# Patient Record
Sex: Male | Born: 1965 | Hispanic: No | Marital: Married | Smoking: Former smoker
Health system: Southern US, Community
[De-identification: ages and names within clinical notes are randomized; demographics above are authoritative.]

## PROBLEM LIST (undated history)

## (undated) DIAGNOSIS — I639 Cerebral infarction, unspecified: Secondary | ICD-10-CM

## (undated) DIAGNOSIS — I10 Essential (primary) hypertension: Secondary | ICD-10-CM

## (undated) DIAGNOSIS — E119 Type 2 diabetes mellitus without complications: Secondary | ICD-10-CM

## (undated) DIAGNOSIS — I2699 Other pulmonary embolism without acute cor pulmonale: Secondary | ICD-10-CM

## (undated) DIAGNOSIS — J189 Pneumonia, unspecified organism: Secondary | ICD-10-CM

---

## 1997-06-24 ENCOUNTER — Ambulatory Visit (HOSPITAL_BASED_OUTPATIENT_CLINIC_OR_DEPARTMENT_OTHER): Admission: RE | Admit: 1997-06-24 | Discharge: 1997-06-24 | Payer: Self-pay | Admitting: *Deleted

## 2019-01-26 ENCOUNTER — Encounter (HOSPITAL_BASED_OUTPATIENT_CLINIC_OR_DEPARTMENT_OTHER): Payer: Self-pay

## 2019-01-26 ENCOUNTER — Other Ambulatory Visit: Payer: Self-pay

## 2019-01-26 ENCOUNTER — Emergency Department (HOSPITAL_BASED_OUTPATIENT_CLINIC_OR_DEPARTMENT_OTHER)
Admission: EM | Admit: 2019-01-26 | Discharge: 2019-01-26 | Disposition: A | Discharge status: Home / Self Care | Payer: Self-pay | Attending: Emergency Medicine | Admitting: Emergency Medicine

## 2019-01-26 ENCOUNTER — Emergency Department (HOSPITAL_BASED_OUTPATIENT_CLINIC_OR_DEPARTMENT_OTHER): Payer: Self-pay

## 2019-01-26 DIAGNOSIS — Z20822 Contact with and (suspected) exposure to covid-19: Secondary | ICD-10-CM | POA: Insufficient documentation

## 2019-01-26 DIAGNOSIS — U071 COVID-19: Secondary | ICD-10-CM | POA: Insufficient documentation

## 2019-01-26 LAB — CBC WITH DIFFERENTIAL/PLATELET
Abs Immature Granulocytes: 0.03 10*3/uL (ref 0.00–0.07)
Basophils Absolute: 0 10*3/uL (ref 0.0–0.1)
Basophils Relative: 0 %
Eosinophils Absolute: 0 10*3/uL (ref 0.0–0.5)
Eosinophils Relative: 0 %
HCT: 44 % (ref 39.0–52.0)
Hemoglobin: 14.5 g/dL (ref 13.0–17.0)
Immature Granulocytes: 1 %
Lymphocytes Relative: 25 %
Lymphs Abs: 1.3 10*3/uL (ref 0.7–4.0)
MCH: 28.3 pg (ref 26.0–34.0)
MCHC: 33 g/dL (ref 30.0–36.0)
MCV: 85.9 fL (ref 80.0–100.0)
Monocytes Absolute: 0.3 10*3/uL (ref 0.1–1.0)
Monocytes Relative: 7 %
Neutro Abs: 3.4 10*3/uL (ref 1.7–7.7)
Neutrophils Relative %: 67 %
Platelets: 254 10*3/uL (ref 150–400)
RBC: 5.12 MIL/uL (ref 4.22–5.81)
RDW: 11.8 % (ref 11.5–15.5)
WBC: 5.1 10*3/uL (ref 4.0–10.5)
nRBC: 0 % (ref 0.0–0.2)

## 2019-01-26 LAB — COMPREHENSIVE METABOLIC PANEL
ALT: 35 U/L (ref 0–44)
AST: 42 U/L — ABNORMAL HIGH (ref 15–41)
Albumin: 3.5 g/dL (ref 3.5–5.0)
Alkaline Phosphatase: 64 U/L (ref 38–126)
Anion gap: 14 (ref 5–15)
BUN: 7 mg/dL (ref 6–20)
CO2: 23 mmol/L (ref 22–32)
Calcium: 8 mg/dL — ABNORMAL LOW (ref 8.9–10.3)
Chloride: 96 mmol/L — ABNORMAL LOW (ref 98–111)
Creatinine, Ser: 0.69 mg/dL (ref 0.61–1.24)
GFR calc Af Amer: >60 mL/min (ref >60)
GFR calc non Af Amer: >60 mL/min (ref >60)
Glucose, Bld: 229 mg/dL — ABNORMAL HIGH (ref 70–99)
Potassium: 3.4 mmol/L — ABNORMAL LOW (ref 3.5–5.1)
Sodium: 133 mmol/L — ABNORMAL LOW (ref 135–145)
Total Bilirubin: 0.7 mg/dL (ref 0.3–1.2)
Total Protein: 7.3 g/dL (ref 6.5–8.1)

## 2019-01-26 LAB — LACTIC ACID, PLASMA: Lactic Acid, Venous: 1.1 mmol/L (ref 0.5–1.9)

## 2019-01-26 LAB — SARS CORONAVIRUS 2 AG (30 MIN TAT): SARS Coronavirus 2 Ag: POSITIVE — AB

## 2019-01-26 MED ORDER — ACETAMINOPHEN 325 MG PO TABS
650.0000 mg | ORAL_TABLET | Freq: Once | ORAL | Status: AC | PRN
  Administered 2019-01-26: 650 mg via ORAL
  Filled 2019-01-26 (×2): qty 2

## 2019-01-26 MED ORDER — DOXYCYCLINE HYCLATE 100 MG PO CAPS: 100.0000 mg | ORAL_CAPSULE | Freq: Two times a day (BID) | ORAL | 0 refills | Status: DC

## 2019-01-26 MED ORDER — SODIUM CHLORIDE 0.9 % IV BOLUS
1000.0000 mL | Freq: Once | INTRAVENOUS | Status: AC
  Administered 2019-01-26: 1000 mL via INTRAVENOUS

## 2019-01-26 NOTE — Discharge Instructions (Addendum)
Thank you for allowing [place] to care for you today.   Please return to the emergency department if you have any new or worsening symptoms.  You tested positive for covid-19 today.   Medications- You can take medications to help treat your symptoms: -Tylenol for fever and body aches. Please take as prescribed on the bottle. -Over the coutner cough medicine such as mucinex, robitussin, or other brands. -Flonase or saline nasal spray for nasal congestion -Vitamins as recommended by CDC  Treatment-  Prescription sent to your pharmacy for doxycycline.  Please take this as prescribed.  This an antibiotic medicine used to treat pneumonia.  Your chest x-ray today showed you have pneumonia.  This could be related to Covid.  It is important to monitor your symptoms closely: -You should have a theremometer at home to check your temperature when feeling feverish. -Use a pulse ox meter to measure your oxygen when feeling short of breath.  -If your fever is over 100.4 despite taking tylenol or if your oxygen level drops below 94% these are reasons to rturn to the emergency department for further evaluation. Please call the emergency department before you come to make [place] aware.    We recommend you self-isolate for 10 days and to inform your work/family/friends that you has the virus.  They will need to self-quarantine for 14 days to monitor for symptoms.    Again: symptoms of shortness of breath, chest pain, difficulty breathing, new onset of confusion, any symptoms that are concerning. If any of these symptoms you should come to emergency department for evaluation.   I hope you feel better soon    -Also very important that you follow-up with a primary care provider to have your blood sugar rechecked.  It was elevated today.  You might have diabetes.  You can call the number provided in your discharge paperwork to help establish care with a primary care provider.

## 2019-01-26 NOTE — ED Triage Notes (Signed)
Pt c/o cough x 3 days. Pt also having chills, body aches.

## 2019-01-26 NOTE — ED Notes (Signed)
Pt paced room x 8  Initial O2 95%, pt stayed 95-96% during ambulation.

## 2019-01-26 NOTE — ED Provider Notes (Signed)
[facility] EMERGENCY DEPARTMENT Provider Note   CSN: [id number] Arrival date & time: 01/26/19  1635     History Chief Complaint  Patient presents with  . Cough    Andrew Bruce is a 54 y.o. male with no known past medical history presenting to emergency department today with chief complaint of cough x3 days.  Patient is also endorsing chills and body aches.  He states his cough is productive with yellow phlegm.  He has checked his temperature at home and had T-max of 101 yesterday. He has body aches from head to toe, states everything hurts.  He has not taken any medications at home for his symptoms but has been drinking hot tea. Admits to loss of smell and taste.  He denies any congestion, rhinorrhea, sore throat, chest pain, shortness of breath, dyspnea on exertion, neck pain, headache, abdominal pain, nausea, vomiting, urinary symptoms, diarrhea, lower extremity edema.  He denies any sick contacts.  He denies any contact with anyone known positive for COVID-19.   Due to language barrier, a video interpreter was present during the history-taking and subsequent discussion (and for part of the physical exam) with this patient.   History reviewed. No pertinent past medical history.  There are no problems to display for this patient.   History reviewed. No pertinent surgical history.     No family history on file.  Social History   Tobacco Use  . Smoking status: Never Smoker  . Smokeless tobacco: Never Used  Substance Use Topics  . Alcohol use: Yes  . Drug use: Never    Home Medications Prior to Admission medications   Medication Sig Start Date End Date Taking? Authorizing Provider  doxycycline (VIBRAMYCIN) 100 MG capsule Take 1 capsule (100 mg total) by mouth 2 (two) times daily. 01/26/19   Kayveon Lennartz, Harley Hallmark, PA-C    Allergies    Patient has no known allergies.  Review of Systems   Review of Systems All other systems are reviewed and are negative for  acute change except as noted in the HPI.  Physical Exam Updated Vital Signs BP (!) 168/93 (BP Location: Left Arm)   Pulse 92   Temp (!) 103.2 F (39.6 C) (Oral)   Resp (!) 24   Ht 5' 6"  (1.676 m)   Wt 81.6 kg   SpO2 93%   BMI 29.05 kg/m   Physical Exam Vitals and nursing note reviewed.  Constitutional:      General: He is not in acute distress.    Appearance: He is not ill-appearing.  HENT:     Head: Normocephalic and atraumatic.     Right Ear: Tympanic membrane and external ear normal.     Left Ear: Tympanic membrane and external ear normal.     Nose: Nose normal.     Mouth/Throat:     Mouth: Mucous membranes are dry.     Pharynx: Oropharynx is clear.     Comments: Minor erythema to oropharynx, no edema, no exudate, no tonsillar swelling, voice normal, neck supple without lymphadenopathy  Eyes:     General: No scleral icterus.       Right eye: No discharge.        Left eye: No discharge.     Extraocular Movements: Extraocular movements intact.     Conjunctiva/sclera: Conjunctivae normal.     Pupils: Pupils are equal, round, and reactive to light.  Neck:     Vascular: No JVD.     Comments: No meningeal signs  Cardiovascular:     Rate and Rhythm: Normal rate and regular rhythm.     Pulses: Normal pulses.          Radial pulses are 2+ on the right side and 2+ on the left side.     Heart sounds: Normal heart sounds.  Pulmonary:     Comments: Lung sounds diminished throughout.  Symmetric chest rise, normal work of breathing. No wheezing, rales, or rhonchi.  SPO2 during exam is 95% on room air. Abdominal:     Comments: Abdomen is soft, non-distended, and non-tender in all quadrants. No rigidity, no guarding. No peritoneal signs.  Musculoskeletal:        General: Normal range of motion.     Cervical back: Normal range of motion.     Right lower leg: No edema.     Left lower leg: No edema.  Skin:    General: Skin is warm and dry.     Capillary Refill: Capillary refill  takes less than 2 seconds.     Findings: No rash.  Neurological:     Mental Status: He is oriented to person, place, and time.     GCS: GCS eye subscore is 4. GCS verbal subscore is 5. GCS motor subscore is 6.     Comments: Fluent speech, no facial droop.  Psychiatric:        Behavior: Behavior normal.     ED Results / Procedures / Treatments   Labs (all labs ordered are listed, but only abnormal results are displayed) Labs Reviewed  SARS CORONAVIRUS 2 AG (30 MIN TAT) - Abnormal; Notable for the following components:      Result Value   SARS Coronavirus 2 Ag POSITIVE (*)    All other components within normal limits  COMPREHENSIVE METABOLIC PANEL - Abnormal; Notable for the following components:   Sodium 133 (*)    Potassium 3.4 (*)    Chloride 96 (*)    Glucose, Bld 229 (*)    Calcium 8.0 (*)    AST 42 (*)    All other components within normal limits  LACTIC ACID, PLASMA  CBC WITH DIFFERENTIAL/PLATELET  LACTIC ACID, PLASMA    EKG None  Radiology DG Chest Portable 1 View  Result Date: 01/26/2019 CLINICAL DATA:  54 year old male with fever and cough. EXAM: PORTABLE CHEST 1 VIEW COMPARISON:  None. FINDINGS: Patchy area of streaky and hazy density in the left lower lung field as well as minimal right mid and right lung base streaky densities concerning for pneumonia, possibly viral or atypical in etiology. Clinical correlation is recommended. No pleural effusion or pneumothorax. Top-normal cardiac size. No acute osseous pathology. IMPRESSION: Pneumonia.  Clinical correlation and follow-up recommended. Electronically Signed   By: Anner Crete M.D.   On: 01/26/2019 17:42    Procedures Procedures (including critical care time)  Medications Ordered in ED Medications  acetaminophen (TYLENOL) tablet 650 mg (650 mg Oral Given 01/26/19 1710)  sodium chloride 0.9 % bolus 1,000 mL (0 mLs Intravenous Stopped 01/26/19 2019)    ED Course  I have reviewed the triage vital signs and  the nursing notes.  Pertinent labs & imaging results that were available during my care of the patient were reviewed by me and considered in my medical decision making (see chart for details).  Clinical Course as of Jan 27 135  Tue Jan 26, 2019  1922 Lactic acid within normal range  Lactic acid, plasma [KA]  1922 No leukocytosis, no anemia  CBC  with Differential [KA]  1922 Covid positive  SARS Coronavirus 2 Ag (30 min TAT) - Nasal Swab (BD Veritor Kit)(!) [KA]  1922 CMP notable for glucose of 229, anion gap of 14  Comprehensive metabolic panel(!) [KA]    Clinical Course User Index [KA] Cherre Robins, PA-C   Vitals:   01/26/19 1658 01/26/19 1914 01/26/19 1923 01/26/19 2146  BP:  135/76  130/74  Pulse:  90  78  Resp:  20  19  Temp:   97.9 F (36.6 C) 99.1 F (37.3 C)  TempSrc:   Oral Oral  SpO2:  95%  96%  Weight: 81.6 kg     Height: 5' 6"  (1.676 m)       MDM Rules/Calculators/A&P                      Patient seen and examined. Patient presents awake, alert, hemodynamically stable, nontoxic appearing, in no acute distress.  In triage patient was noted to be febrile at 103.2 had SPO2 of 93% on room air, he was not tachycardic.  He received Tylenol for fever in triage. On my exam patient with SPO2 of 95%.  He is afebrile at 98.7.  He appears dehydrated, mucous membranes are dry.  Lung sounds are diminished throughout, normal work of breathing, no respiratory distress. He is speaking in full sentences. Abdomen is nontender, no peritoneal signs. I viewed labs ordered in triage and significant findings are documented in ED course above.  Patient tested positive for Covid.  No leukocytosis, no anemia.  Lactic acid within normal range at 1.1. He does have glucose of 229, he denies history of diabetes.  Anion gap is 14, still within normal range however at the higher end of normal.  Given this and his signs of dehydration will give 1L IVF. Chest xray viewed by me concerning for  pneumonia with patchy and hazy density left lower lung and right mid and lung base streaky densities.  Patient ambulated in the emergency department without respiratory distress, tachycardia, or hypoxia. SpO2 during ambulation >94% on room air. Engaged in shared decision-making.  Patient is requesting to be discharged home.  He feels he can manage her symptoms at home.  Will discharge home with prescription for doxycycline to cover for possible bacterial pneumonia. The patient appears reasonably screened and/or stabilized for discharge and I doubt any other medical condition or other [facility] requiring further screening, evaluation, or treatment in the ED at this time prior to discharge. The patient is safe for discharge with strict return precautions discussed. Recommend pcp follow up to have further testing for diabetes, given resource list.Findings and plan of care discussed with supervising physician Dr. Johnney Killian agrees with plan and antibiotic coverage.  Andrew Bruce was evaluated in Emergency Department on 01/27/2019 for the symptoms described in the history of present illness. He was evaluated in the context of the global COVID-19 pandemic, which necessitated consideration that the patient might be at risk for infection with the SARS-CoV-2 virus that causes COVID-19. Institutional protocols and algorithms that pertain to the evaluation of patients at risk for COVID-19 are in a state of rapid change based on information released by regulatory bodies including the CDC and federal and state organizations. These policies and algorithms were followed during the patient's care in the ED.   Portions of this note were generated with [employer]. Dictation errors may occur despite best attempts at proofreading.  Final Clinical Impression(s) / ED Diagnoses Final diagnoses:  COVID-19 virus infection    Rx / DC Orders ED Discharge Orders         Ordered    doxycycline (VIBRAMYCIN) 100 MG  capsule  2 times daily     01/26/19 2123           Flint Melter 01/27/19 2244    Charlesetta Shanks, MD 02/02/19 [phone]

## 2019-02-06 ENCOUNTER — Inpatient Hospital Stay (HOSPITAL_COMMUNITY)
Admission: EM | Admit: 2019-02-06 | Discharge: 2019-03-23 | DRG: 004 | Disposition: A | Discharge status: Rehabilitation Facility | Payer: HRSA Program | Attending: Internal Medicine | Admitting: Internal Medicine

## 2019-02-06 ENCOUNTER — Emergency Department (HOSPITAL_COMMUNITY): Payer: HRSA Program

## 2019-02-06 ENCOUNTER — Other Ambulatory Visit: Payer: Self-pay

## 2019-02-06 ENCOUNTER — Encounter (HOSPITAL_COMMUNITY): Payer: Self-pay

## 2019-02-06 DIAGNOSIS — Z938 Other artificial opening status: Secondary | ICD-10-CM

## 2019-02-06 DIAGNOSIS — Z8616 Personal history of COVID-19: Secondary | ICD-10-CM | POA: Diagnosis not present

## 2019-02-06 DIAGNOSIS — I6340 Cerebral infarction due to embolism of unspecified cerebral artery: Secondary | ICD-10-CM | POA: Insufficient documentation

## 2019-02-06 DIAGNOSIS — U071 COVID-19: Secondary | ICD-10-CM | POA: Diagnosis not present

## 2019-02-06 DIAGNOSIS — J939 Pneumothorax, unspecified: Secondary | ICD-10-CM

## 2019-02-06 DIAGNOSIS — Z7401 Bed confinement status: Secondary | ICD-10-CM

## 2019-02-06 DIAGNOSIS — Z430 Encounter for attention to tracheostomy: Secondary | ICD-10-CM | POA: Diagnosis not present

## 2019-02-06 DIAGNOSIS — Z794 Long term (current) use of insulin: Secondary | ICD-10-CM | POA: Diagnosis not present

## 2019-02-06 DIAGNOSIS — T41295A Adverse effect of other general anesthetics, initial encounter: Secondary | ICD-10-CM | POA: Diagnosis not present

## 2019-02-06 DIAGNOSIS — Z515 Encounter for palliative care: Secondary | ICD-10-CM

## 2019-02-06 DIAGNOSIS — R29731 NIHSS score 31: Secondary | ICD-10-CM | POA: Diagnosis not present

## 2019-02-06 DIAGNOSIS — E46 Unspecified protein-calorie malnutrition: Secondary | ICD-10-CM | POA: Diagnosis not present

## 2019-02-06 DIAGNOSIS — J95851 Ventilator associated pneumonia: Secondary | ICD-10-CM | POA: Diagnosis not present

## 2019-02-06 DIAGNOSIS — R0989 Other specified symptoms and signs involving the circulatory and respiratory systems: Secondary | ICD-10-CM | POA: Diagnosis not present

## 2019-02-06 DIAGNOSIS — T886XXA Anaphylactic reaction due to adverse effect of correct drug or medicament properly administered, initial encounter: Secondary | ICD-10-CM | POA: Diagnosis not present

## 2019-02-06 DIAGNOSIS — Z931 Gastrostomy status: Secondary | ICD-10-CM | POA: Diagnosis not present

## 2019-02-06 DIAGNOSIS — J383 Other diseases of vocal cords: Secondary | ICD-10-CM | POA: Diagnosis not present

## 2019-02-06 DIAGNOSIS — D649 Anemia, unspecified: Secondary | ICD-10-CM | POA: Diagnosis not present

## 2019-02-06 DIAGNOSIS — R34 Anuria and oliguria: Secondary | ICD-10-CM | POA: Diagnosis not present

## 2019-02-06 DIAGNOSIS — Y848 Other medical procedures as the cause of abnormal reaction of the patient, or of later complication, without mention of misadventure at the time of the procedure: Secondary | ICD-10-CM | POA: Diagnosis not present

## 2019-02-06 DIAGNOSIS — T380X5A Adverse effect of glucocorticoids and synthetic analogues, initial encounter: Secondary | ICD-10-CM | POA: Diagnosis not present

## 2019-02-06 DIAGNOSIS — J9600 Acute respiratory failure, unspecified whether with hypoxia or hypercapnia: Secondary | ICD-10-CM

## 2019-02-06 DIAGNOSIS — G8191 Hemiplegia, unspecified affecting right dominant side: Secondary | ICD-10-CM | POA: Diagnosis not present

## 2019-02-06 DIAGNOSIS — J9601 Acute respiratory failure with hypoxia: Secondary | ICD-10-CM | POA: Diagnosis not present

## 2019-02-06 DIAGNOSIS — Z930 Tracheostomy status: Secondary | ICD-10-CM | POA: Diagnosis not present

## 2019-02-06 DIAGNOSIS — R1310 Dysphagia, unspecified: Secondary | ICD-10-CM | POA: Diagnosis not present

## 2019-02-06 DIAGNOSIS — Z95828 Presence of other vascular implants and grafts: Secondary | ICD-10-CM

## 2019-02-06 DIAGNOSIS — B948 Sequelae of other specified infectious and parasitic diseases: Secondary | ICD-10-CM | POA: Diagnosis not present

## 2019-02-06 DIAGNOSIS — I7410 Embolism and thrombosis of unspecified parts of aorta: Secondary | ICD-10-CM | POA: Diagnosis not present

## 2019-02-06 DIAGNOSIS — R5381 Other malaise: Secondary | ICD-10-CM | POA: Diagnosis not present

## 2019-02-06 DIAGNOSIS — Z7901 Long term (current) use of anticoagulants: Secondary | ICD-10-CM | POA: Diagnosis not present

## 2019-02-06 DIAGNOSIS — I69398 Other sequelae of cerebral infarction: Secondary | ICD-10-CM | POA: Diagnosis not present

## 2019-02-06 DIAGNOSIS — L89304 Pressure ulcer of unspecified buttock, stage 4: Secondary | ICD-10-CM | POA: Diagnosis not present

## 2019-02-06 DIAGNOSIS — D72825 Bandemia: Secondary | ICD-10-CM | POA: Diagnosis not present

## 2019-02-06 DIAGNOSIS — T4275XA Adverse effect of unspecified antiepileptic and sedative-hypnotic drugs, initial encounter: Secondary | ICD-10-CM | POA: Diagnosis not present

## 2019-02-06 DIAGNOSIS — E1152 Type 2 diabetes mellitus with diabetic peripheral angiopathy with gangrene: Secondary | ICD-10-CM | POA: Diagnosis not present

## 2019-02-06 DIAGNOSIS — J80 Acute respiratory distress syndrome: Secondary | ICD-10-CM

## 2019-02-06 DIAGNOSIS — J9611 Chronic respiratory failure with hypoxia: Secondary | ICD-10-CM

## 2019-02-06 DIAGNOSIS — E870 Hyperosmolality and hypernatremia: Secondary | ICD-10-CM | POA: Diagnosis not present

## 2019-02-06 DIAGNOSIS — B9689 Other specified bacterial agents as the cause of diseases classified elsewhere: Secondary | ICD-10-CM | POA: Diagnosis not present

## 2019-02-06 DIAGNOSIS — I96 Gangrene, not elsewhere classified: Secondary | ICD-10-CM | POA: Diagnosis not present

## 2019-02-06 DIAGNOSIS — I50811 Acute right heart failure: Secondary | ICD-10-CM | POA: Diagnosis not present

## 2019-02-06 DIAGNOSIS — K5903 Drug induced constipation: Secondary | ICD-10-CM | POA: Diagnosis not present

## 2019-02-06 DIAGNOSIS — D62 Acute posthemorrhagic anemia: Secondary | ICD-10-CM | POA: Diagnosis not present

## 2019-02-06 DIAGNOSIS — J9612 Chronic respiratory failure with hypercapnia: Secondary | ICD-10-CM

## 2019-02-06 DIAGNOSIS — R1314 Dysphagia, pharyngoesophageal phase: Secondary | ICD-10-CM | POA: Diagnosis not present

## 2019-02-06 DIAGNOSIS — J189 Pneumonia, unspecified organism: Secondary | ICD-10-CM

## 2019-02-06 DIAGNOSIS — J982 Interstitial emphysema: Secondary | ICD-10-CM | POA: Diagnosis present

## 2019-02-06 DIAGNOSIS — J1282 Pneumonia due to coronavirus disease 2019: Secondary | ICD-10-CM | POA: Diagnosis present

## 2019-02-06 DIAGNOSIS — Z79899 Other long term (current) drug therapy: Secondary | ICD-10-CM

## 2019-02-06 DIAGNOSIS — Y92230 Patient room in hospital as the place of occurrence of the external cause: Secondary | ICD-10-CM | POA: Diagnosis not present

## 2019-02-06 DIAGNOSIS — Z978 Presence of other specified devices: Secondary | ICD-10-CM

## 2019-02-06 DIAGNOSIS — Z9911 Dependence on respirator [ventilator] status: Secondary | ICD-10-CM | POA: Diagnosis not present

## 2019-02-06 DIAGNOSIS — R7309 Other abnormal glucose: Secondary | ICD-10-CM | POA: Diagnosis not present

## 2019-02-06 DIAGNOSIS — R339 Retention of urine, unspecified: Secondary | ICD-10-CM | POA: Diagnosis not present

## 2019-02-06 DIAGNOSIS — E11649 Type 2 diabetes mellitus with hypoglycemia without coma: Secondary | ICD-10-CM | POA: Diagnosis not present

## 2019-02-06 DIAGNOSIS — I2609 Other pulmonary embolism with acute cor pulmonale: Secondary | ICD-10-CM | POA: Diagnosis not present

## 2019-02-06 DIAGNOSIS — E1165 Type 2 diabetes mellitus with hyperglycemia: Secondary | ICD-10-CM | POA: Diagnosis not present

## 2019-02-06 DIAGNOSIS — E781 Pure hyperglyceridemia: Secondary | ICD-10-CM | POA: Diagnosis not present

## 2019-02-06 DIAGNOSIS — E876 Hypokalemia: Secondary | ICD-10-CM | POA: Diagnosis not present

## 2019-02-06 DIAGNOSIS — Z4659 Encounter for fitting and adjustment of other gastrointestinal appliance and device: Secondary | ICD-10-CM

## 2019-02-06 DIAGNOSIS — G9341 Metabolic encephalopathy: Secondary | ICD-10-CM | POA: Diagnosis not present

## 2019-02-06 DIAGNOSIS — L8990 Pressure ulcer of unspecified site, unspecified stage: Secondary | ICD-10-CM | POA: Insufficient documentation

## 2019-02-06 DIAGNOSIS — E1169 Type 2 diabetes mellitus with other specified complication: Secondary | ICD-10-CM | POA: Diagnosis not present

## 2019-02-06 DIAGNOSIS — L89150 Pressure ulcer of sacral region, unstageable: Secondary | ICD-10-CM | POA: Diagnosis not present

## 2019-02-06 DIAGNOSIS — E162 Hypoglycemia, unspecified: Secondary | ICD-10-CM | POA: Diagnosis not present

## 2019-02-06 DIAGNOSIS — T402X5A Adverse effect of other opioids, initial encounter: Secondary | ICD-10-CM | POA: Diagnosis not present

## 2019-02-06 DIAGNOSIS — K5900 Constipation, unspecified: Secondary | ICD-10-CM

## 2019-02-06 DIAGNOSIS — L89309 Pressure ulcer of unspecified buttock, unspecified stage: Secondary | ICD-10-CM | POA: Diagnosis not present

## 2019-02-06 DIAGNOSIS — Z7189 Other specified counseling: Secondary | ICD-10-CM | POA: Diagnosis not present

## 2019-02-06 DIAGNOSIS — R0602 Shortness of breath: Secondary | ICD-10-CM

## 2019-02-06 DIAGNOSIS — N179 Acute kidney failure, unspecified: Secondary | ICD-10-CM | POA: Diagnosis present

## 2019-02-06 DIAGNOSIS — E785 Hyperlipidemia, unspecified: Secondary | ICD-10-CM | POA: Diagnosis present

## 2019-02-06 DIAGNOSIS — K567 Ileus, unspecified: Secondary | ICD-10-CM

## 2019-02-06 DIAGNOSIS — L98499 Non-pressure chronic ulcer of skin of other sites with unspecified severity: Secondary | ICD-10-CM | POA: Diagnosis not present

## 2019-02-06 DIAGNOSIS — I6310 Cerebral infarction due to embolism of unspecified precerebral artery: Secondary | ICD-10-CM | POA: Diagnosis not present

## 2019-02-06 DIAGNOSIS — Z9289 Personal history of other medical treatment: Secondary | ICD-10-CM

## 2019-02-06 DIAGNOSIS — J3800 Paralysis of vocal cords and larynx, unspecified: Secondary | ICD-10-CM | POA: Diagnosis not present

## 2019-02-06 HISTORY — DX: COVID-19: U07.1

## 2019-02-06 LAB — ABO/RH: ABO/RH(D): O POS

## 2019-02-06 LAB — CBC WITH DIFFERENTIAL/PLATELET
Abs Immature Granulocytes: 0.59 10*3/uL — ABNORMAL HIGH (ref 0.00–0.07)
Basophils Absolute: 0.1 10*3/uL (ref 0.0–0.1)
Basophils Relative: 0 %
Eosinophils Absolute: 0 10*3/uL (ref 0.0–0.5)
Eosinophils Relative: 0 %
HCT: 43.7 % (ref 39.0–52.0)
Hemoglobin: 13.4 g/dL (ref 13.0–17.0)
Immature Granulocytes: 4 %
Lymphocytes Relative: 7 %
Lymphs Abs: 1.1 10*3/uL (ref 0.7–4.0)
MCH: 27.6 pg (ref 26.0–34.0)
MCHC: 30.7 g/dL (ref 30.0–36.0)
MCV: 89.9 fL (ref 80.0–100.0)
Monocytes Absolute: 0.6 10*3/uL (ref 0.1–1.0)
Monocytes Relative: 4 %
Neutro Abs: 13.4 10*3/uL — ABNORMAL HIGH (ref 1.7–7.7)
Neutrophils Relative %: 85 %
Platelets: 306 10*3/uL (ref 150–400)
RBC: 4.86 MIL/uL (ref 4.22–5.81)
RDW: 12.4 % (ref 11.5–15.5)
WBC: 15.8 10*3/uL — ABNORMAL HIGH (ref 4.0–10.5)
nRBC: 0.9 % — ABNORMAL HIGH (ref 0.0–0.2)

## 2019-02-06 LAB — COMPREHENSIVE METABOLIC PANEL
ALT: 62 U/L — ABNORMAL HIGH (ref 0–44)
AST: 78 U/L — ABNORMAL HIGH (ref 15–41)
Albumin: 2.2 g/dL — ABNORMAL LOW (ref 3.5–5.0)
Alkaline Phosphatase: 103 U/L (ref 38–126)
Anion gap: 20 — ABNORMAL HIGH (ref 5–15)
BUN: 20 mg/dL (ref 6–20)
CO2: 16 mmol/L — ABNORMAL LOW (ref 22–32)
Calcium: 7.6 mg/dL — ABNORMAL LOW (ref 8.9–10.3)
Chloride: 99 mmol/L (ref 98–111)
Creatinine, Ser: 1.18 mg/dL (ref 0.61–1.24)
GFR calc Af Amer: >60 mL/min (ref >60)
GFR calc non Af Amer: >60 mL/min (ref >60)
Glucose, Bld: 473 mg/dL — ABNORMAL HIGH (ref 70–99)
Potassium: 4.8 mmol/L (ref 3.5–5.1)
Sodium: 135 mmol/L (ref 135–145)
Total Bilirubin: 1.1 mg/dL (ref 0.3–1.2)
Total Protein: 6.3 g/dL — ABNORMAL LOW (ref 6.5–8.1)

## 2019-02-06 LAB — GLUCOSE, CAPILLARY
Glucose-Capillary: 133 mg/dL — ABNORMAL HIGH (ref 70–99)
Glucose-Capillary: 219 mg/dL — ABNORMAL HIGH (ref 70–99)
Glucose-Capillary: 303 mg/dL — ABNORMAL HIGH (ref 70–99)
Glucose-Capillary: 437 mg/dL — ABNORMAL HIGH (ref 70–99)

## 2019-02-06 LAB — FIBRINOGEN: Fibrinogen: 706 mg/dL — ABNORMAL HIGH (ref 210–475)

## 2019-02-06 LAB — CBG MONITORING, ED: Glucose-Capillary: 445 mg/dL — ABNORMAL HIGH (ref 70–99)

## 2019-02-06 LAB — FERRITIN: Ferritin: 1303 ng/mL — ABNORMAL HIGH (ref 24–336)

## 2019-02-06 LAB — LACTIC ACID, PLASMA
Lactic Acid, Venous: 6 mmol/L (ref 0.5–1.9)
Lactic Acid, Venous: 3.6 mmol/L (ref 0.5–1.9)

## 2019-02-06 LAB — MRSA PCR SCREENING: MRSA by PCR: NEGATIVE

## 2019-02-06 LAB — LACTATE DEHYDROGENASE: LDH: 553 U/L — ABNORMAL HIGH (ref 98–192)

## 2019-02-06 LAB — HEMOGLOBIN A1C
Hgb A1c MFr Bld: 12.6 % — ABNORMAL HIGH (ref 4.8–5.6)
Mean Plasma Glucose: 314.92 mg/dL

## 2019-02-06 LAB — D-DIMER, QUANTITATIVE: D-Dimer, Quant: 19.43 ug/mL-FEU — ABNORMAL HIGH (ref 0.00–0.50)

## 2019-02-06 LAB — PROCALCITONIN: Procalcitonin: 0.17 ng/mL

## 2019-02-06 LAB — TRIGLYCERIDES: Triglycerides: 171 mg/dL — ABNORMAL HIGH (ref <150)

## 2019-02-06 LAB — C-REACTIVE PROTEIN: CRP: 11.7 mg/dL — ABNORMAL HIGH (ref <1.0)

## 2019-02-06 MED ORDER — ENOXAPARIN SODIUM 80 MG/0.8ML ~~LOC~~ SOLN
1.0000 mg/kg | Freq: Two times a day (BID) | SUBCUTANEOUS | Status: DC
  Administered 2019-02-06 – 2019-02-09 (×8): 80 mg via SUBCUTANEOUS
  Filled 2019-02-06 (×8): qty 0.8

## 2019-02-06 MED ORDER — ZINC SULFATE 220 (50 ZN) MG PO CAPS
220.0000 mg | ORAL_CAPSULE | Freq: Every day | ORAL | Status: DC
  Administered 2019-02-06 – 2019-02-07 (×2): 220 mg via ORAL
  Filled 2019-02-06 (×2): qty 1

## 2019-02-06 MED ORDER — INSULIN ASPART 100 UNIT/ML ~~LOC~~ SOLN: 0.0000 [IU] | SUBCUTANEOUS | Status: DC

## 2019-02-06 MED ORDER — INSULIN ASPART 100 UNIT/ML ~~LOC~~ SOLN
0.0000 [IU] | SUBCUTANEOUS | Status: DC
  Administered 2019-02-06: 15 [IU] via SUBCUTANEOUS
  Administered 2019-02-06: 11 [IU] via SUBCUTANEOUS
  Administered 2019-02-06: 5 [IU] via SUBCUTANEOUS
  Administered 2019-02-07: 3 [IU] via SUBCUTANEOUS
  Administered 2019-02-07 (×2): 2 [IU] via SUBCUTANEOUS
  Administered 2019-02-07: 3 [IU] via SUBCUTANEOUS
  Administered 2019-02-08: 5 [IU] via SUBCUTANEOUS
  Administered 2019-02-08: 3 [IU] via SUBCUTANEOUS
  Administered 2019-02-09 (×2): 8 [IU] via SUBCUTANEOUS

## 2019-02-06 MED ORDER — FOLIC ACID 1 MG PO TABS
1.0000 mg | ORAL_TABLET | Freq: Every day | ORAL | Status: DC
  Administered 2019-02-06 – 2019-02-07 (×2): 1 mg via ORAL
  Filled 2019-02-06 (×2): qty 1

## 2019-02-06 MED ORDER — CHLORHEXIDINE GLUCONATE CLOTH 2 % EX PADS
6.0000 | MEDICATED_PAD | Freq: Every day | CUTANEOUS | Status: DC
  Administered 2019-02-06: 6 via TOPICAL

## 2019-02-06 MED ORDER — ONDANSETRON HCL 4 MG PO TABS: 4.0000 mg | ORAL_TABLET | Freq: Four times a day (QID) | ORAL | Status: DC | PRN

## 2019-02-06 MED ORDER — ASCORBIC ACID 500 MG PO TABS
500.0000 mg | ORAL_TABLET | Freq: Every day | ORAL | Status: DC
  Administered 2019-02-06 – 2019-02-07 (×2): 500 mg via ORAL
  Filled 2019-02-06 (×2): qty 1

## 2019-02-06 MED ORDER — INSULIN DETEMIR 100 UNIT/ML ~~LOC~~ SOLN
12.0000 [IU] | Freq: Two times a day (BID) | SUBCUTANEOUS | Status: DC
  Administered 2019-02-06 – 2019-02-08 (×6): 12 [IU] via SUBCUTANEOUS
  Filled 2019-02-06 (×8): qty 0.12

## 2019-02-06 MED ORDER — ADULT MULTIVITAMIN W/MINERALS CH
1.0000 | ORAL_TABLET | Freq: Every day | ORAL | Status: DC
  Administered 2019-02-06 – 2019-02-07 (×2): 1 via ORAL
  Filled 2019-02-06 (×2): qty 1

## 2019-02-06 MED ORDER — SODIUM CHLORIDE 0.9 % IV SOLN
100.0000 mg | Freq: Every day | INTRAVENOUS | Status: AC
  Administered 2019-02-07 – 2019-02-10 (×4): 100 mg via INTRAVENOUS
  Filled 2019-02-06 (×4): qty 20

## 2019-02-06 MED ORDER — DEXAMETHASONE SODIUM PHOSPHATE 10 MG/ML IJ SOLN
6.0000 mg | INTRAMUSCULAR | Status: AC
  Administered 2019-02-06 – 2019-02-15 (×10): 6 mg via INTRAVENOUS
  Filled 2019-02-06 (×9): qty 1

## 2019-02-06 MED ORDER — OXYCODONE HCL 5 MG PO TABS: 5.0000 mg | ORAL_TABLET | ORAL | Status: DC | PRN

## 2019-02-06 MED ORDER — SENNOSIDES-DOCUSATE SODIUM 8.6-50 MG PO TABS
1.0000 | ORAL_TABLET | Freq: Every evening | ORAL | Status: DC | PRN
  Administered 2019-02-07: 1 via ORAL
  Filled 2019-02-06: qty 1

## 2019-02-06 MED ORDER — SODIUM CHLORIDE 0.9 % IV SOLN
200.0000 mg | Freq: Once | INTRAVENOUS | Status: AC
  Administered 2019-02-06: 200 mg via INTRAVENOUS
  Filled 2019-02-06: qty 200

## 2019-02-06 MED ORDER — ACETAMINOPHEN 325 MG PO TABS
650.0000 mg | ORAL_TABLET | Freq: Four times a day (QID) | ORAL | Status: DC | PRN
  Administered 2019-02-07: 650 mg via ORAL
  Filled 2019-02-06: qty 2

## 2019-02-06 MED ORDER — ONDANSETRON HCL 4 MG/2ML IJ SOLN: 4.0000 mg | Freq: Four times a day (QID) | INTRAMUSCULAR | Status: DC | PRN

## 2019-02-06 MED ORDER — FAMOTIDINE 20 MG PO TABS
20.0000 mg | ORAL_TABLET | Freq: Two times a day (BID) | ORAL | Status: DC
  Administered 2019-02-06 – 2019-02-07 (×3): 20 mg via ORAL
  Filled 2019-02-06 (×3): qty 1

## 2019-02-06 MED ORDER — LACTATED RINGERS IV SOLN: INTRAVENOUS | Status: DC

## 2019-02-06 MED ORDER — THIAMINE HCL 100 MG PO TABS
100.0000 mg | ORAL_TABLET | Freq: Every day | ORAL | Status: DC
  Administered 2019-02-06 – 2019-02-07 (×2): 100 mg via ORAL
  Filled 2019-02-06 (×2): qty 1

## 2019-02-06 MED ORDER — SODIUM CHLORIDE 0.9 % IV BOLUS
500.0000 mL | Freq: Once | INTRAVENOUS | Status: AC
  Administered 2019-02-06: 500 mL via INTRAVENOUS

## 2019-02-06 MED ORDER — DEXAMETHASONE SODIUM PHOSPHATE 10 MG/ML IJ SOLN
6.0000 mg | Freq: Once | INTRAMUSCULAR | Status: DC
  Filled 2019-02-06: qty 1

## 2019-02-06 NOTE — H&P (Signed)
NAME:  Andrew Bruce, MRN:  [id number], DOB:  1965-05-16, LOS: 0 ADMISSION DATE:  02/06/2019, CONSULTATION DATE:  02/06/19 REFERRING MD:  ER, CHIEF COMPLAINT:  SOB   Brief History   This is a 54 year old man without a past medical history who presents with worsening shortness of breath over the past 2 days in the context of a 10-day Covid illness.  Chest x-ray and vitals are consistent with a worsening Covid ARDS.  History of present illness   This is a 54 year old man without a past medical history who presents with worsening shortness of breath over the past 2 days in the context of a 10-day Covid illness.  Chest x-ray and vitals are consistent with a worsening Covid ARDS.  History performed with Spanish interpreter as patient is Spanish-speaking only.  He states he was feeling fatigued but stable since an ER visit last week for Covid.  Over the past 2 days he has noticed a dramatic increase in his shortness of breath.  He is dyspneic even speaking.  He was placed on a nonrebreather and nasal cannula in the ER and is still mildly tachypneic with sats in the 80s.  He denies chest pain, productive cough.  He does have a dry cough with inspiration that is paroxysmal.  He denies any leg swelling.  He has lost his sense of taste and smell.  PCCM consulted for admission.  Patient has never been hospitalized for.  He has no chronic medical issues and is on no home medications.  Past Medical History  None  Significant Hospital Events   02/06/2019 admitted  Consults:  None  Procedures:  None  Significant Diagnostic Tests:  Chest x-ray with an ARDS pattern  Micro Data:  Covid positive Pct Neg  Antimicrobials:  Remdesivir 02/06/19>> Dexamethasone 02/06/19>>   Interim history/subjective:  Consulted  Objective   Blood pressure (!) 141/97, pulse (!) 107, temperature 97.8 F (36.6 C), temperature source Axillary, resp. rate (!) 34, SpO2 (!) 87 %.       No intake or output data in the 24  hours ending 02/06/19 0925 There were no vitals filed for this visit.  Examination: General: Middle-age man in no acute distress HENT: Nonrebreather in place on nasal cannula, trachea midline Lungs: Lung sounds are clear bilaterally, he is mildly tachypneic and will occasionally use accessory muscles Cardiovascular: Regular rate and rhythm, extremities warm Abdomen: Soft, hypoactive bowel sounds Extremities: No edema Neuro: Moves all 4 extremities to command Skin: No rashes  CXR ARDS Labs show acute kidney injury, mild transaminitis, mild leukocytosis, normal platelets, inflammatory markers are elevated, D-dimer is 19  Resolved Hospital Problem list   N/A  Assessment & Plan:  # COVID-19 ARDS -Remdesivir, dexamethasone, encourage proning and incentive spirometry- -given rapidity of worsening and elevated D-dimer, will fully anticoagulate  # Acute kidney injury from poor p.o., infection  -Gentle hydration x1 day, monitor urine output and renal indices  # Mild transaminitis -Related to infection, trend for now  Best practice:  Diet: Clear liquids Pain/Anxiety/Delirium protocol (if indicated): Not indicated VAP protocol (if indicated): Not indicated DVT prophylaxis: Full dose Lovenox GI prophylaxis: Pepcid Glucose control: SSI Mobility: Bedrest with proning Code Status: Full Family Communication: Updated patient Disposition: We will send to [facility] tonight, if he is stable tomorrow he can likely go to progressive  Labs   CBC: Recent Labs  Lab 02/06/19 0654  WBC 15.8*  NEUTROABS 13.4*  HGB 13.4  HCT 43.7  MCV 89.9  PLT 306  Basic Metabolic Panel: Recent Labs  Lab 02/06/19 0654  NA 135  K 4.8  CL 99  CO2 16*  GLUCOSE 473*  BUN 20  CREATININE 1.18  CALCIUM 7.6*   GFR: Estimated Creatinine Clearance: 73.4 mL/min (by C-G formula based on SCr of 1.18 mg/dL). Recent Labs  Lab 02/06/19 0654 02/06/19 0717  PROCALCITON 0.17  --   WBC 15.8*  --   LATICACIDVEN   --  6.0*    Liver Function Tests: Recent Labs  Lab 02/06/19 0654  AST 78*  ALT 62*  ALKPHOS 103  BILITOT 1.1  PROT 6.3*  ALBUMIN 2.2*   No results for input(s): LIPASE, AMYLASE in the last 168 hours. No results for input(s): AMMONIA in the last 168 hours.  ABG No results found for: PHART, PCO2ART, PO2ART, HCO3, TCO2, ACIDBASEDEF, O2SAT   Coagulation Profile: No results for input(s): INR, PROTIME in the last 168 hours.  Cardiac Enzymes: No results for input(s): CKTOTAL, CKMB, CKMBINDEX, TROPONINI in the last 168 hours.  HbA1C: No results found for: HGBA1C  CBG: No results for input(s): GLUCAP in the last 168 hours.  Review of Systems:    Positive Symptoms in bold:  Constitutional fevers, chills, weight loss, fatigue, anorexia, malaise  Eyes decreased vision, double vision, eye irritation  Ears, Nose, Mouth, Throat sore throat, trouble swallowing, sinus congestion  Cardiovascular chest pain, paroxysmal nocturnal dyspnea, lower ext edema, palpitations   Respiratory SOB, cough, DOE, hemoptysis, wheezing  Gastrointestinal nausea, vomiting, diarrhea  Genitourinary burning with urination, trouble urinating  Musculoskeletal joint aches, joint swelling, back pain  Integumentary  rashes, skin lesions  Neurological focal weakness, focal numbness, trouble speaking, headaches  Psychiatric depression, anxiety, confusion  Endocrine polyuria, polydipsia, cold intolerance, heat intolerance  Hematologic abnormal bruising, abnormal bleeding, unexplained nose bleeds  Allergic/Immunologic recurrent infections, hives, swollen lymph nodes     Past Medical History  He,  has no past medical history on file.   Surgical History   History reviewed. No pertinent surgical history.   Social History   reports that he has never smoked. He has never used smokeless tobacco. He reports current alcohol use. He reports that he does not use drugs.   Family History   His family history is  not on file.   Allergies No Known Allergies   Home Medications  Prior to Admission medications   Medication Sig Start Date End Date Taking? Authorizing Provider  doxycycline (VIBRAMYCIN) 100 MG capsule Take 1 capsule (100 mg total) by mouth 2 (two) times daily. 01/26/19   Albrizze, Harley Hallmark, PA-C     Critical care time: 35 minutes

## 2019-02-06 NOTE — ED Notes (Signed)
PT got up to use the bathroom but too weak to get up. His oxygen is sating at about 77% with NRB. 5lit  added per verbal. PT is up to 85-87% will continue to monitor

## 2019-02-06 NOTE — Plan of Care (Signed)
  Problem: Education: Goal: Knowledge of General Education information will improve Description Including pain rating scale, medication(s)/side effects and non-pharmacologic comfort measures Outcome: Progressing   

## 2019-02-06 NOTE — ED Notes (Signed)
PT says he prefers to stay on the bedpan because he will have a bowel movement soon

## 2019-02-06 NOTE — ED Provider Notes (Signed)
Emergency Department Provider Note   I have reviewed the triage vital signs and the nursing notes.   HISTORY  Chief Complaint COVID and Respiratory Distress   HPI Andrew Bruce is a 54 y.o. male presents to the ED with increased SOB in the setting of being diagnosed with COVID 19 on 01/26/19. He reports feeling worse over the 5 days.  Patient tells me that he has been feeling increasingly short of breath overnight and that ultimately his wife called EMS this morning.  He denies any chest pain or pressure.  He is not having heart palpitations.  He denies abdominal pain, diarrhea, vomiting.  No radiation of symptoms or other modifying factors. No known history of underlying respiratory issues.   EMS arrived to find the patient profoundly hypoxemic with RA sats of 36% on RA. Started NRB and transported to the ED.   History reviewed. No pertinent past medical history.  There are no problems to display for this patient.   History reviewed. No pertinent surgical history.  Allergies Patient has no known allergies.  No family history on file.  Social History Social History   Tobacco Use  . Smoking status: Never Smoker  . Smokeless tobacco: Never Used  Substance Use Topics  . Alcohol use: Yes  . Drug use: Never    Review of Systems  Constitutional: Positive fever/chills Eyes: No visual changes. ENT: No sore throat. Cardiovascular: Denies chest pain. Respiratory:Positive shortness of breath and cough.  Gastrointestinal: No abdominal pain.  No nausea, no vomiting.  No diarrhea.  No constipation. Genitourinary: Negative for dysuria. Musculoskeletal: Negative for back pain. Skin: Negative for rash. Neurological: Negative for headaches, focal weakness or numbness.  10-point ROS otherwise negative.  ____________________________________________   PHYSICAL EXAM:  VITAL SIGNS: ED Triage Vitals  Enc Vitals Group     BP 02/06/19 0651 112/89     Pulse Rate 02/06/19  0649 (!) 104     Resp 02/06/19 0649 (!) 26     Temp 02/06/19 0651 97.8 F (36.6 C)     Temp Source 02/06/19 0651 Axillary     SpO2 02/06/19 0649 93 %   Constitutional: Alert and oriented. Mild tachypnea without significant increased WOB.  Eyes: Conjunctivae are normal.  Head: Atraumatic. Nose: No congestion/rhinnorhea. Mouth/Throat: Mucous membranes are moist.  Neck: No stridor.   Cardiovascular: Tachycardia. Good peripheral circulation. Grossly normal heart sounds.   Respiratory: Slight increased respiratory effort.  No retractions. Lungs CTAB. Gastrointestinal: Soft and nontender. No distention.  Musculoskeletal: No lower extremity tenderness nor edema. No gross deformities of extremities. Neurologic:  Normal speech and language. No gross focal neurologic deficits are appreciated.  Skin:  Skin is warm, dry and intact. No rash noted.   ____________________________________________   LABS (all labs ordered are listed, but only abnormal results are displayed)  Labs Reviewed  LACTIC ACID, PLASMA - Abnormal; Notable for the following components:      Result Value   Lactic Acid, Venous 6.0 (*)    All other components within normal limits  CBC WITH DIFFERENTIAL/PLATELET - Abnormal; Notable for the following components:   WBC 15.8 (*)    nRBC 0.9 (*)    Neutro Abs 13.4 (*)    Abs Immature Granulocytes 0.59 (*)    All other components within normal limits  COMPREHENSIVE METABOLIC PANEL - Abnormal; Notable for the following components:   CO2 16 (*)    Glucose, Bld 473 (*)    Calcium 7.6 (*)    Total Protein  6.3 (*)    Albumin 2.2 (*)    AST 78 (*)    ALT 62 (*)    Anion gap 20 (*)    All other components within normal limits  LACTATE DEHYDROGENASE - Abnormal; Notable for the following components:   LDH 553 (*)    All other components within normal limits  TRIGLYCERIDES - Abnormal; Notable for the following components:   Triglycerides 171 (*)    All other components within  normal limits  C-REACTIVE PROTEIN - Abnormal; Notable for the following components:   CRP 11.7 (*)    All other components within normal limits  FERRITIN - Abnormal; Notable for the following components:   Ferritin 1,303 (*)    All other components within normal limits  D-DIMER, QUANTITATIVE (NOT AT [facility]) - Abnormal; Notable for the following components:   D-Dimer, Quant 19.43 (*)    All other components within normal limits  FIBRINOGEN - Abnormal; Notable for the following components:   Fibrinogen 706 (*)    All other components within normal limits  CULTURE, BLOOD (ROUTINE X 2)  CULTURE, BLOOD (ROUTINE X 2)  PROCALCITONIN  LACTIC ACID, PLASMA   ____________________________________________  EKG   EKG Interpretation  Date/Time:  Saturday February 06 2019 06:50:37 EST Ventricular Rate:  103 PR Interval:    QRS Duration: 113 QT Interval:  416 QTC Calculation: 545 R Axis:   153 Text Interpretation: Sinus tachycardia IRBBB and LPFB Inferior infarct, recent Probable anterior infarct, age indeterminate Lateral leads are also involved Prolonged QT interval Baseline wander in lead(s) V3 No old tracing to compare Confirmed by Ward, Cyril Mourning [phone]) on 02/06/2019 6:54:32 AM       ____________________________________________  RADIOLOGY  DG Chest Port 1 View  Result Date: 02/06/2019 CLINICAL DATA:  Respiratory distress. EXAM: PORTABLE CHEST 1 VIEW COMPARISON:  01/26/2019 FINDINGS: Of 731 hours. Low volume film. Patchy bilateral ground-glass and confluent airspace disease noted bilaterally with a peripheral predominance. No evidence for pleural effusion. Cardiopericardial silhouette is at upper limits of normal for size. The visualized bony structures of the thorax are intact. Telemetry leads overlie the chest. IMPRESSION: Low volume film with multifocal airspace disease compatible with pneumonia. Electronically Signed   By: Misty Stanley M.D.   On: 02/06/2019 08:18     ____________________________________________   PROCEDURES  Procedure(s) performed:   Procedures  CRITICAL CARE Performed by: Margette Fast Total critical care time: 35 minutes Critical care time was exclusive of separately billable procedures and treating other patients. Critical care was necessary to treat or prevent imminent or life-threatening deterioration. Critical care was time spent personally by me on the following activities: development of treatment plan with patient and/or surrogate as well as nursing, discussions with consultants, evaluation of patient's response to treatment, examination of patient, obtaining history from patient or surrogate, ordering and performing treatments and interventions, ordering and review of laboratory studies, ordering and review of radiographic studies, pulse oximetry and re-evaluation of patient's condition.  Nanda Quinton, MD Emergency Medicine  ____________________________________________   INITIAL IMPRESSION / ASSESSMENT AND PLAN / ED COURSE  Pertinent labs & imaging results that were available during my care of the patient were reviewed by me and considered in my medical decision making (see chart for details).   Patient presents to the emergency department with increased shortness of breath over the past 5 days.  He was profoundly hypoxemic with EMS on scene with sats of 36% on room air.  Started on nonrebreather and sats ultimately  up into the high 80s/low 90s.  He does have some mild tachypnea but overall appears relatively comfortable.  I do not feel he requires intubation or BiPAP at this time.  We will continue supplemental oxygen, Decadron, remdesivir, and admit. Discussed with patient and he is FULL CODE.   Lactate elevated to 6.0. Suspect this is related to profound hypoxemia PTA and should correct with supplemental O2. Will give 500 ml NS bolus but avoid 35ml/kg bolus with COVID and concern for volume overload. Doubt sepsis or  secondary bacterial infection.   08:46 AM  Spoke with ICU who will be down to evaluate the patient. He is on very high O2 at this point. D-dimer is significantly elevated but likely on too much O2 at this point for CTA. Continues to have tachypnea but is conversational in short sentences with nursing. Patient with occasional desaturation events that are very positional.   Discussed patient's case with ICU to request admission. Patient and family (if present) updated with plan. Care transferred to ICU service.  I reviewed all nursing notes, vitals, pertinent old records, EKGs, labs, imaging (as available).  ____________________________________________  FINAL CLINICAL IMPRESSION(S) / ED DIAGNOSES  Final diagnoses:  Acute respiratory failure with hypoxia (HCC)  COVID-19     MEDICATIONS GIVEN DURING THIS VISIT:  Medications  dexamethasone (DECADRON) injection 6 mg (has no administration in time range)  remdesivir 200 mg in sodium chloride 0.9% 250 mL IVPB (has no administration in time range)    Followed by  remdesivir 100 mg in sodium chloride 0.9 % 100 mL IVPB (has no administration in time range)  sodium chloride 0.9 % bolus 500 mL (has no administration in time range)    Note:  This document was prepared using Dragon voice recognition software and may include unintentional dictation errors.  Alona Bene, MD, [facility] Emergency Medicine    Geovannie Vilar, Arlyss Repress, MD 02/06/19 1901

## 2019-02-06 NOTE — ED Triage Notes (Signed)
Pt comes via [facility] from home tested positive 10 days ago, resp distress started tonight, oxygen 36% upon EMS arrival, on NRB, PTA 125 solumedrol, 2 gm mag

## 2019-02-06 NOTE — ED Provider Notes (Signed)
MSE was initiated and I personally evaluated the patient and placed orders (if any) at  6:52 AM on February 06, 2019.  The patient appears stable so that the remainder of the MSE may be completed by another provider.   Patient Covid +10 days ago.  Here with hypoxia, increased work of breathing.  Sats in the 30s at home and in the 70s on nonrebreather by EMS.  Currently sats 94% on nonrebreather.  Respiratory rate in the mid 20s.  Stable to be seen by oncoming provider.  Labs, chest x-ray ordered.   Mostafa Yuan, Layla Maw, DO 02/06/19 [phone]

## 2019-02-06 NOTE — ED Notes (Signed)
CBG 445 MD paged to notify

## 2019-02-06 NOTE — Progress Notes (Signed)
CBG 437. MD notified. Verbal orders given to give max dose of SSI as well as the Levemir.

## 2019-02-07 ENCOUNTER — Inpatient Hospital Stay (HOSPITAL_COMMUNITY): Payer: HRSA Program

## 2019-02-07 DIAGNOSIS — J810 Acute pulmonary edema: Secondary | ICD-10-CM

## 2019-02-07 LAB — CBC WITH DIFFERENTIAL/PLATELET
Abs Immature Granulocytes: 0.23 10*3/uL — ABNORMAL HIGH (ref 0.00–0.07)
Basophils Absolute: 0 10*3/uL (ref 0.0–0.1)
Basophils Relative: 0 %
Eosinophils Absolute: 0 10*3/uL (ref 0.0–0.5)
Eosinophils Relative: 0 %
HCT: 41.6 % (ref 39.0–52.0)
Hemoglobin: 13.3 g/dL (ref 13.0–17.0)
Immature Granulocytes: 1 %
Lymphocytes Relative: 10 %
Lymphs Abs: 1.9 10*3/uL (ref 0.7–4.0)
MCH: 28 pg (ref 26.0–34.0)
MCHC: 32 g/dL (ref 30.0–36.0)
MCV: 87.6 fL (ref 80.0–100.0)
Monocytes Absolute: 0.9 10*3/uL (ref 0.1–1.0)
Monocytes Relative: 4 %
Neutro Abs: 16.7 10*3/uL — ABNORMAL HIGH (ref 1.7–7.7)
Neutrophils Relative %: 85 %
Platelets: 290 10*3/uL (ref 150–400)
RBC: 4.75 MIL/uL (ref 4.22–5.81)
RDW: 12.7 % (ref 11.5–15.5)
WBC: 19.8 10*3/uL — ABNORMAL HIGH (ref 4.0–10.5)
nRBC: 0.6 % — ABNORMAL HIGH (ref 0.0–0.2)

## 2019-02-07 LAB — COMPREHENSIVE METABOLIC PANEL
ALT: 76 U/L — ABNORMAL HIGH (ref 0–44)
AST: 120 U/L — ABNORMAL HIGH (ref 15–41)
Albumin: 2.3 g/dL — ABNORMAL LOW (ref 3.5–5.0)
Alkaline Phosphatase: 105 U/L (ref 38–126)
Anion gap: 13 (ref 5–15)
BUN: 16 mg/dL (ref 6–20)
CO2: 22 mmol/L (ref 22–32)
Calcium: 8.1 mg/dL — ABNORMAL LOW (ref 8.9–10.3)
Chloride: 108 mmol/L (ref 98–111)
Creatinine, Ser: 0.66 mg/dL (ref 0.61–1.24)
GFR calc Af Amer: >60 mL/min (ref >60)
GFR calc non Af Amer: >60 mL/min (ref >60)
Glucose, Bld: 93 mg/dL (ref 70–99)
Potassium: 3.8 mmol/L (ref 3.5–5.1)
Sodium: 143 mmol/L (ref 135–145)
Total Bilirubin: 0.8 mg/dL (ref 0.3–1.2)
Total Protein: 6.2 g/dL — ABNORMAL LOW (ref 6.5–8.1)

## 2019-02-07 LAB — GLUCOSE, CAPILLARY
Glucose-Capillary: 100 mg/dL — ABNORMAL HIGH (ref 70–99)
Glucose-Capillary: 83 mg/dL (ref 70–99)
Glucose-Capillary: 151 mg/dL — ABNORMAL HIGH (ref 70–99)
Glucose-Capillary: 177 mg/dL — ABNORMAL HIGH (ref 70–99)
Glucose-Capillary: 148 mg/dL — ABNORMAL HIGH (ref 70–99)
Glucose-Capillary: 88 mg/dL (ref 70–99)

## 2019-02-07 LAB — POCT I-STAT 7, (LYTES, BLD GAS, ICA,H+H)
Acid-Base Excess: 1 mmol/L (ref 0.0–2.0)
Bicarbonate: 26.4 mmol/L (ref 20.0–28.0)
Calcium, Ion: 1.08 mmol/L — ABNORMAL LOW (ref 1.15–1.40)
HCT: 39 % (ref 39.0–52.0)
Hemoglobin: 13.3 g/dL (ref 13.0–17.0)
O2 Saturation: 97 %
Patient temperature: 98.2
Potassium: 4 mmol/L (ref 3.5–5.1)
Sodium: 141 mmol/L (ref 135–145)
TCO2: 28 mmol/L (ref 22–32)
pCO2 arterial: 43.5 mmHg (ref 32.0–48.0)
pH, Arterial: 7.391 (ref 7.350–7.450)
pO2, Arterial: 86 mmHg (ref 83.0–108.0)

## 2019-02-07 LAB — C-REACTIVE PROTEIN: CRP: 10.1 mg/dL — ABNORMAL HIGH (ref <1.0)

## 2019-02-07 LAB — D-DIMER, QUANTITATIVE: D-Dimer, Quant: 16.42 ug/mL-FEU — ABNORMAL HIGH (ref 0.00–0.50)

## 2019-02-07 MED ORDER — CHLORHEXIDINE GLUCONATE CLOTH 2 % EX PADS
6.0000 | MEDICATED_PAD | Freq: Every day | CUTANEOUS | Status: DC
  Administered 2019-02-08 – 2019-03-23 (×43): 6 via TOPICAL

## 2019-02-07 MED ORDER — FENTANYL CITRATE (PF) 100 MCG/2ML IJ SOLN
INTRAMUSCULAR | Status: AC
  Administered 2019-02-07: 100 ug
  Filled 2019-02-07: qty 2

## 2019-02-07 MED ORDER — MIDAZOLAM HCL 2 MG/2ML IJ SOLN
1.0000 mg | INTRAMUSCULAR | Status: DC | PRN
  Administered 2019-02-07 – 2019-02-08 (×2): 2 mg via INTRAVENOUS
  Filled 2019-02-07 (×2): qty 2

## 2019-02-07 MED ORDER — ACETAMINOPHEN 160 MG/5ML PO SOLN
650.0000 mg | Freq: Four times a day (QID) | ORAL | Status: DC | PRN
  Administered 2019-02-08 – 2019-03-16 (×23): 650 mg
  Filled 2019-02-07 (×24): qty 20.3

## 2019-02-07 MED ORDER — FENTANYL 2500MCG IN NS 250ML (10MCG/ML) PREMIX INFUSION
0.0000 ug/h | INTRAVENOUS | Status: DC
  Administered 2019-02-07: 25 ug/h via INTRAVENOUS
  Administered 2019-02-07 – 2019-02-08 (×3): 400 ug/h via INTRAVENOUS
  Filled 2019-02-07 (×4): qty 250

## 2019-02-07 MED ORDER — ETOMIDATE 2 MG/ML IV SOLN
20.0000 mg | Freq: Once | INTRAVENOUS | Status: AC
  Administered 2019-02-07: 20 mg via INTRAVENOUS

## 2019-02-07 MED ORDER — ADULT MULTIVITAMIN W/MINERALS CH
1.0000 | ORAL_TABLET | Freq: Every day | ORAL | Status: DC
  Administered 2019-02-08 – 2019-02-26 (×19): 1
  Filled 2019-02-07 (×19): qty 1

## 2019-02-07 MED ORDER — ASCORBIC ACID 500 MG PO TABS
500.0000 mg | ORAL_TABLET | Freq: Every day | ORAL | Status: DC
  Administered 2019-02-08 – 2019-03-23 (×41): 500 mg
  Filled 2019-02-07 (×42): qty 1

## 2019-02-07 MED ORDER — NOREPINEPHRINE 4 MG/250ML-% IV SOLN
INTRAVENOUS | Status: AC
  Filled 2019-02-07: qty 250

## 2019-02-07 MED ORDER — ORAL CARE MOUTH RINSE
15.0000 mL | OROMUCOSAL | Status: DC
  Administered 2019-02-07 – 2019-03-23 (×413): 15 mL via OROMUCOSAL

## 2019-02-07 MED ORDER — FOLIC ACID 1 MG PO TABS
1.0000 mg | ORAL_TABLET | Freq: Every day | ORAL | Status: DC
  Administered 2019-02-08 – 2019-03-23 (×42): 1 mg
  Filled 2019-02-07 (×44): qty 1

## 2019-02-07 MED ORDER — FAMOTIDINE 20 MG PO TABS
20.0000 mg | ORAL_TABLET | Freq: Two times a day (BID) | ORAL | Status: DC
  Administered 2019-02-07 – 2019-03-23 (×84): 20 mg
  Filled 2019-02-07 (×84): qty 1

## 2019-02-07 MED ORDER — CHLORHEXIDINE GLUCONATE 0.12% ORAL RINSE (MEDLINE KIT)
15.0000 mL | Freq: Two times a day (BID) | OROMUCOSAL | Status: DC
  Administered 2019-02-07 – 2019-03-23 (×84): 15 mL via OROMUCOSAL

## 2019-02-07 MED ORDER — HYDRALAZINE HCL 20 MG/ML IJ SOLN
10.0000 mg | INTRAMUSCULAR | Status: DC | PRN
  Administered 2019-02-07 – 2019-03-08 (×4): 10 mg via INTRAVENOUS
  Filled 2019-02-07 (×4): qty 1

## 2019-02-07 MED ORDER — MIDAZOLAM HCL 2 MG/2ML IJ SOLN
INTRAMUSCULAR | Status: AC
  Filled 2019-02-07: qty 2

## 2019-02-07 MED ORDER — GUAIFENESIN ER 600 MG PO TB12
1200.0000 mg | ORAL_TABLET | Freq: Two times a day (BID) | ORAL | Status: DC
  Administered 2019-02-07: 1200 mg via ORAL
  Filled 2019-02-07: qty 2

## 2019-02-07 MED ORDER — MIDAZOLAM HCL 2 MG/2ML IJ SOLN
INTRAMUSCULAR | Status: AC
  Administered 2019-02-07: 2 mg
  Filled 2019-02-07: qty 2

## 2019-02-07 MED ORDER — FUROSEMIDE 10 MG/ML IJ SOLN
40.0000 mg | Freq: Three times a day (TID) | INTRAMUSCULAR | Status: AC
  Administered 2019-02-07 (×2): 40 mg via INTRAVENOUS
  Filled 2019-02-07 (×2): qty 4

## 2019-02-07 MED ORDER — OXYCODONE HCL 5 MG PO TABS: 5.0000 mg | ORAL_TABLET | ORAL | Status: DC | PRN

## 2019-02-07 MED ORDER — SODIUM CHLORIDE 0.9 % IV SOLN
250.0000 mL | INTRAVENOUS | Status: DC
  Administered 2019-02-07 – 2019-03-12 (×5): 250 mL via INTRAVENOUS

## 2019-02-07 MED ORDER — ZINC SULFATE 220 (50 ZN) MG PO CAPS
220.0000 mg | ORAL_CAPSULE | Freq: Every day | ORAL | Status: DC
  Administered 2019-02-08 – 2019-03-23 (×42): 220 mg
  Filled 2019-02-07 (×42): qty 1

## 2019-02-07 MED ORDER — NOREPINEPHRINE 4 MG/250ML-% IV SOLN
2.0000 ug/min | INTRAVENOUS | Status: DC
  Administered 2019-02-07: 10 ug/min via INTRAVENOUS
  Administered 2019-02-07: 6 ug/min via INTRAVENOUS
  Administered 2019-02-08: 8 ug/min via INTRAVENOUS
  Administered 2019-02-08: 10 ug/min via INTRAVENOUS
  Filled 2019-02-07 (×4): qty 250

## 2019-02-07 MED ORDER — THIAMINE HCL 100 MG PO TABS
100.0000 mg | ORAL_TABLET | Freq: Every day | ORAL | Status: DC
  Administered 2019-02-08 – 2019-03-23 (×42): 100 mg
  Filled 2019-02-07 (×43): qty 1

## 2019-02-07 MED ORDER — DEXMEDETOMIDINE HCL IN NACL 400 MCG/100ML IV SOLN
0.4000 ug/kg/h | INTRAVENOUS | Status: DC
  Administered 2019-02-07: 1 ug/kg/h via INTRAVENOUS
  Administered 2019-02-07: 0.4 ug/kg/h via INTRAVENOUS
  Administered 2019-02-07: 1.2 ug/kg/h via INTRAVENOUS
  Administered 2019-02-08 (×3): 1.4 ug/kg/h via INTRAVENOUS
  Filled 2019-02-07 (×6): qty 100

## 2019-02-07 MED ORDER — SENNOSIDES-DOCUSATE SODIUM 8.6-50 MG PO TABS
1.0000 | ORAL_TABLET | Freq: Every evening | ORAL | Status: DC | PRN
  Administered 2019-02-17: 1
  Filled 2019-02-07: qty 1

## 2019-02-07 MED ORDER — SUCCINYLCHOLINE CHLORIDE 20 MG/ML IJ SOLN
200.0000 mg | Freq: Once | INTRAMUSCULAR | Status: AC
  Administered 2019-02-07: 200 mg via INTRAVENOUS
  Filled 2019-02-07: qty 10

## 2019-02-07 MED ORDER — GUAIFENESIN 100 MG/5ML PO SOLN
60.0000 mL | Freq: Two times a day (BID) | ORAL | Status: DC
  Administered 2019-02-07 – 2019-02-11 (×8): 1200 mg
  Filled 2019-02-07: qty 15
  Filled 2019-02-07: qty 60
  Filled 2019-02-07: qty 45
  Filled 2019-02-07 (×2): qty 15
  Filled 2019-02-07: qty 45
  Filled 2019-02-07 (×3): qty 15
  Filled 2019-02-07: qty 45
  Filled 2019-02-07: qty 60

## 2019-02-07 NOTE — Procedures (Signed)
Intubation Procedure Note Andrew Bruce [id number] Jul 13, 1965  Procedure: Intubation Indications: Respiratory insufficiency  Procedure Details Consent: Risks of procedure as well as the alternatives and risks of each were explained to the (patient/caregiver).  Consent for procedure obtained. Time Out: Verified patient identification, verified procedure, site/side was marked, verified correct patient position, special equipment/implants available, medications/allergies/relevent history reviewed, required imaging and test results available.  Performed  Maximum sterile technique was used including antiseptics, cap, gloves, gown, hand hygiene and mask.  MAC    Evaluation Hemodynamic Status: BP stable throughout; O2 sats: stable throughout Patient's Current Condition: stable Complications: No apparent complications Patient did tolerate procedure well. Chest X-ray ordered to verify placement.  CXR: pending.   Andrew Bruce 02/07/2019

## 2019-02-07 NOTE — Progress Notes (Addendum)
Hospitalist notified regarding elevated blood pressure.  Last blood pressure 180/118.   0100: IV hydralazine ordered by provider

## 2019-02-07 NOTE — H&P (Signed)
NAME:  Katai Marsico, MRN:  [id number], DOB:  13-Feb-1965, LOS: 1 ADMISSION DATE:  02/06/2019, CONSULTATION DATE:  02/06/19 REFERRING MD:  ER, CHIEF COMPLAINT:  SOB   Brief History   This is a 54 year old man without a past medical history who presents with worsening shortness of breath over the past 2 days in the context of a 10-day Covid illness.  Chest x-ray and vitals are consistent with a worsening Covid ARDS.  History of present illness   This is a 54 year old man without a past medical history who presents with worsening shortness of breath over the past 2 days in the context of a 10-day Covid illness.  Chest x-ray and vitals are consistent with a worsening Covid ARDS.  History performed with Spanish interpreter as patient is Spanish-speaking only.  He states he was feeling fatigued but stable since an ER visit last week for Covid.  Over the past 2 days he has noticed a dramatic increase in his shortness of breath.  He is dyspneic even speaking.  He was placed on a nonrebreather and nasal cannula in the ER and is still mildly tachypneic with sats in the 80s.  He denies chest pain, productive cough.  He does have a dry cough with inspiration that is paroxysmal.  He denies any leg swelling.  He has lost his sense of taste and smell.  PCCM consulted for admission.  Patient has never been hospitalized for.  He has no chronic medical issues and is on no home medications.  Past Medical History  None  Significant Hospital Events   02/06/2019 admitted  Consults:  None  Procedures:  None  Significant Diagnostic Tests:  Chest x-ray with an ARDS pattern  Micro Data:  Covid positive Pct Neg  Antimicrobials:  Remdesivir 02/06/19>> Dexamethasone 02/06/19>>   Interim history/subjective:  HTN overnight, PRN hydralazine ordered No further events, no new complaints  Objective   Blood pressure (!) 142/93, pulse 95, temperature 98.8 F (37.1 C), temperature source Oral, resp. rate (!) 28,  height 5\' 6"  (1.676 m), weight 80.3 kg, SpO2 91 %.        Intake/Output Summary (Last 24 hours) at 02/07/2019 0745 Last data filed at 02/07/2019 4098 Gross per 24 hour  Intake 1613.26 ml  Output 415 ml  Net 1198.26 ml   Filed Weights   02/06/19 1251  Weight: 80.3 kg   Examination: General: Well appearing, moderate respiratory distress HENT: [place]/AT, PERRL, EOM-I and MMM, HFNC and NRB in place Lungs: Coarse diffusely Cardiovascular: RRR, Nl S1/S2 and -M/R/G Abdomen: Soft, NT, ND and +BS Extremities: -edema and -tenderness Neuro: Alert and oriented, moving all ext to command Skin: No rashes  I reviewed CXR myself, infiltrate noted  Discussed with bedside RN and RT  Resolved Hospital Problem list   N/A  Assessment & Plan:  # COVID-19 ARDS - Remdesivir - Decadron - Prone awake as needed - Continue full anti-coagulation for now - Lasix 40 q8 x2 doses - Concerned that will need intubation later, WOB is high  # Acute kidney injury from poor p.o., infection  - KVO IVF - BMET in AM - Replace electrolytes as indicated  # Mild transaminitis - LFTs in AM  Best practice:  Diet: Clear liquids Pain/Anxiety/Delirium protocol (if indicated): Not indicated VAP protocol (if indicated): Not indicated DVT prophylaxis: Full dose Lovenox GI prophylaxis: Pepcid Glucose control: SSI Mobility: Bedrest with proning Code Status: Full Family Communication: Updated patient Disposition: We will send to 90M tonight, if he is  stable tomorrow he can likely go to progressive  Labs   CBC: Recent Labs  Lab 02/06/19 0654 02/07/19 0401  WBC 15.8* 19.8*  NEUTROABS 13.4* 16.7*  HGB 13.4 13.3  HCT 43.7 41.6  MCV 89.9 87.6  PLT 306 290    Basic Metabolic Panel: Recent Labs  Lab 02/06/19 0654 02/07/19 0401  NA 135 143  K 4.8 3.8  CL 99 108  CO2 16* 22  GLUCOSE 473* 93  BUN 20 16  CREATININE 1.18 0.66  CALCIUM 7.6* 8.1*   GFR: Estimated Creatinine Clearance: 107.6 mL/min (by  C-G formula based on SCr of 0.66 mg/dL). Recent Labs  Lab 02/06/19 0654 02/06/19 0717 02/06/19 1008 02/07/19 0401  PROCALCITON 0.17  --   --   --   WBC 15.8*  --   --  19.8*  LATICACIDVEN  --  6.0* 3.6*  --     Liver Function Tests: Recent Labs  Lab 02/06/19 0654 02/07/19 0401  AST 78* 120*  ALT 62* 76*  ALKPHOS 103 105  BILITOT 1.1 0.8  PROT 6.3* 6.2*  ALBUMIN 2.2* 2.3*   No results for input(s): LIPASE, AMYLASE in the last 168 hours. No results for input(s): AMMONIA in the last 168 hours.  ABG No results found for: PHART, PCO2ART, PO2ART, HCO3, TCO2, ACIDBASEDEF, O2SAT   Coagulation Profile: No results for input(s): INR, PROTIME in the last 168 hours.  Cardiac Enzymes: No results for input(s): CKTOTAL, CKMB, CKMBINDEX, TROPONINI in the last 168 hours.  HbA1C: Hgb A1c MFr Bld  Date/Time Value Ref Range Status  02/06/2019 06:54 AM 12.6 (H) 4.8 - 5.6 % Final    Comment:    (NOTE) Pre diabetes:          5.7%-6.4% Diabetes:              >6.4% Glycemic control for   <7.0% adults with diabetes     CBG: Recent Labs  Lab 02/06/19 1136 02/06/19 1611 02/06/19 1936 02/06/19 2345 02/07/19 0327  GLUCAP 437* 303* 219* 133* 100*   The patient is critically ill with multiple organ systems failure and requires high complexity decision making for assessment and support, frequent evaluation and titration of therapies, application of advanced monitoring technologies and extensive interpretation of multiple databases.   Critical Care Time devoted to patient care services described in this note is  31  Minutes. This time reflects time of care of this signee Dr Koren Bound. This critical care time does not reflect procedure time, or teaching time or supervisory time of PA/NP/Med student/Med Resident etc but could involve care discussion time.  Alyson Reedy, M.D. [facility] Pulmonary/Critical Care Medicine.

## 2019-02-07 NOTE — Progress Notes (Signed)
eLink Physician-Brief Progress Note Patient Name: Andrew Bruce DOB: 1965/06/05 MRN: [id number]   Date of Service  02/07/2019  HPI/Events of Note  Hypertension = BP = 180/118  eICU Interventions  Will order: 1. Hydralazine 10 mg IV Q 4 hours PRN SBP > 170 or DBP > 100.     Intervention Category Major Interventions: Hypertension - evaluation and management  [name] 02/07/2019, 1:01 AM

## 2019-02-07 NOTE — Progress Notes (Signed)
RT note-patient waking up from intubation sedation, PC adjusted, VT 550. With VE 12.4

## 2019-02-08 ENCOUNTER — Inpatient Hospital Stay: Payer: Self-pay

## 2019-02-08 ENCOUNTER — Inpatient Hospital Stay (HOSPITAL_COMMUNITY): Payer: HRSA Program

## 2019-02-08 DIAGNOSIS — J80 Acute respiratory distress syndrome: Secondary | ICD-10-CM

## 2019-02-08 DIAGNOSIS — J939 Pneumothorax, unspecified: Secondary | ICD-10-CM

## 2019-02-08 DIAGNOSIS — J9601 Acute respiratory failure with hypoxia: Secondary | ICD-10-CM

## 2019-02-08 DIAGNOSIS — U071 COVID-19: Principal | ICD-10-CM

## 2019-02-08 LAB — GLUCOSE, CAPILLARY
Glucose-Capillary: 44 mg/dL — CL (ref 70–99)
Glucose-Capillary: 127 mg/dL — ABNORMAL HIGH (ref 70–99)
Glucose-Capillary: 75 mg/dL (ref 70–99)
Glucose-Capillary: 137 mg/dL — ABNORMAL HIGH (ref 70–99)
Glucose-Capillary: 55 mg/dL — ABNORMAL LOW (ref 70–99)
Glucose-Capillary: 153 mg/dL — ABNORMAL HIGH (ref 70–99)
Glucose-Capillary: 167 mg/dL — ABNORMAL HIGH (ref 70–99)
Glucose-Capillary: 239 mg/dL — ABNORMAL HIGH (ref 70–99)

## 2019-02-08 LAB — POCT I-STAT 7, (LYTES, BLD GAS, ICA,H+H)
Acid-Base Excess: 1 mmol/L (ref 0.0–2.0)
Acid-base deficit: 1 mmol/L (ref 0.0–2.0)
Acid-base deficit: 2 mmol/L (ref 0.0–2.0)
Bicarbonate: 26.8 mmol/L (ref 20.0–28.0)
Bicarbonate: 28.6 mmol/L — ABNORMAL HIGH (ref 20.0–28.0)
Bicarbonate: 23.3 mmol/L (ref 20.0–28.0)
Calcium, Ion: 1.08 mmol/L — ABNORMAL LOW (ref 1.15–1.40)
Calcium, Ion: 1.08 mmol/L — ABNORMAL LOW (ref 1.15–1.40)
Calcium, Ion: 1.1 mmol/L — ABNORMAL LOW (ref 1.15–1.40)
HCT: 39 % (ref 39.0–52.0)
HCT: 39 % (ref 39.0–52.0)
HCT: 39 % (ref 39.0–52.0)
Hemoglobin: 13.3 g/dL (ref 13.0–17.0)
Hemoglobin: 13.3 g/dL (ref 13.0–17.0)
Hemoglobin: 13.3 g/dL (ref 13.0–17.0)
O2 Saturation: 94 %
O2 Saturation: 92 %
O2 Saturation: 98 %
Patient temperature: 102.9
Patient temperature: 102.1
Patient temperature: 99.5
Potassium: 4.3 mmol/L (ref 3.5–5.1)
Potassium: 4.4 mmol/L (ref 3.5–5.1)
Potassium: 4.4 mmol/L (ref 3.5–5.1)
Sodium: 144 mmol/L (ref 135–145)
Sodium: 143 mmol/L (ref 135–145)
Sodium: 144 mmol/L (ref 135–145)
TCO2: 29 mmol/L (ref 22–32)
TCO2: 30 mmol/L (ref 22–32)
TCO2: 25 mmol/L (ref 22–32)
pCO2 arterial: 61.7 mmHg — ABNORMAL HIGH (ref 32.0–48.0)
pCO2 arterial: 63.5 mmHg — ABNORMAL HIGH (ref 32.0–48.0)
pCO2 arterial: 44 mmHg (ref 32.0–48.0)
pH, Arterial: 7.258 — ABNORMAL LOW (ref 7.350–7.450)
pH, Arterial: 7.271 — ABNORMAL LOW (ref 7.350–7.450)
pH, Arterial: 7.335 — ABNORMAL LOW (ref 7.350–7.450)
pO2, Arterial: 95 mmHg (ref 83.0–108.0)
pO2, Arterial: 83 mmHg (ref 83.0–108.0)
pO2, Arterial: 113 mmHg — ABNORMAL HIGH (ref 83.0–108.0)

## 2019-02-08 LAB — COMPREHENSIVE METABOLIC PANEL
ALT: 65 U/L — ABNORMAL HIGH (ref 0–44)
AST: 53 U/L — ABNORMAL HIGH (ref 15–41)
Albumin: 2.3 g/dL — ABNORMAL LOW (ref 3.5–5.0)
Alkaline Phosphatase: 105 U/L (ref 38–126)
Anion gap: 11 (ref 5–15)
BUN: 34 mg/dL — ABNORMAL HIGH (ref 6–20)
CO2: 26 mmol/L (ref 22–32)
Calcium: 7.6 mg/dL — ABNORMAL LOW (ref 8.9–10.3)
Chloride: 109 mmol/L (ref 98–111)
Creatinine, Ser: 1.23 mg/dL (ref 0.61–1.24)
GFR calc Af Amer: >60 mL/min (ref >60)
GFR calc non Af Amer: >60 mL/min (ref >60)
Glucose, Bld: 81 mg/dL (ref 70–99)
Potassium: 4.6 mmol/L (ref 3.5–5.1)
Sodium: 146 mmol/L — ABNORMAL HIGH (ref 135–145)
Total Bilirubin: 0.4 mg/dL (ref 0.3–1.2)
Total Protein: 6.7 g/dL (ref 6.5–8.1)

## 2019-02-08 LAB — CBC WITH DIFFERENTIAL/PLATELET
Abs Immature Granulocytes: 0.14 10*3/uL — ABNORMAL HIGH (ref 0.00–0.07)
Basophils Absolute: 0 10*3/uL (ref 0.0–0.1)
Basophils Relative: 0 %
Eosinophils Absolute: 0 10*3/uL (ref 0.0–0.5)
Eosinophils Relative: 0 %
HCT: 46.2 % (ref 39.0–52.0)
Hemoglobin: 13.9 g/dL (ref 13.0–17.0)
Immature Granulocytes: 1 %
Lymphocytes Relative: 10 %
Lymphs Abs: 1.5 10*3/uL (ref 0.7–4.0)
MCH: 28 pg (ref 26.0–34.0)
MCHC: 30.1 g/dL (ref 30.0–36.0)
MCV: 93.1 fL (ref 80.0–100.0)
Monocytes Absolute: 0.6 10*3/uL (ref 0.1–1.0)
Monocytes Relative: 4 %
Neutro Abs: 12.9 10*3/uL — ABNORMAL HIGH (ref 1.7–7.7)
Neutrophils Relative %: 85 %
Platelets: 331 10*3/uL (ref 150–400)
RBC: 4.96 MIL/uL (ref 4.22–5.81)
RDW: 13.6 % (ref 11.5–15.5)
WBC: 15.2 10*3/uL — ABNORMAL HIGH (ref 4.0–10.5)
nRBC: 0.5 % — ABNORMAL HIGH (ref 0.0–0.2)

## 2019-02-08 LAB — PHOSPHORUS
Phosphorus: 7 mg/dL — ABNORMAL HIGH (ref 2.5–4.6)
Phosphorus: 5.8 mg/dL — ABNORMAL HIGH (ref 2.5–4.6)

## 2019-02-08 LAB — MAGNESIUM
Magnesium: 2.7 mg/dL — ABNORMAL HIGH (ref 1.7–2.4)
Magnesium: 2.7 mg/dL — ABNORMAL HIGH (ref 1.7–2.4)

## 2019-02-08 LAB — C-REACTIVE PROTEIN: CRP: 19.4 mg/dL — ABNORMAL HIGH (ref <1.0)

## 2019-02-08 LAB — D-DIMER, QUANTITATIVE: D-Dimer, Quant: 4.89 ug/mL-FEU — ABNORMAL HIGH (ref 0.00–0.50)

## 2019-02-08 MED ORDER — VITAL 1.5 CAL PO LIQD
1000.0000 mL | ORAL | Status: DC
  Administered 2019-02-08 – 2019-03-03 (×20): 1000 mL
  Filled 2019-02-08 (×32): qty 1000

## 2019-02-08 MED ORDER — DEXTROSE 50 % IV SOLN
INTRAVENOUS | Status: AC
  Filled 2019-02-08: qty 50

## 2019-02-08 MED ORDER — ARTIFICIAL TEARS OPHTHALMIC OINT
1.0000 | TOPICAL_OINTMENT | Freq: Three times a day (TID) | OPHTHALMIC | Status: DC
Start: 2019-02-08 — End: 2019-02-12
  Administered 2019-02-08 – 2019-02-12 (×11): 1 via OPHTHALMIC
  Filled 2019-02-08 (×2): qty 3.5

## 2019-02-08 MED ORDER — MIDAZOLAM BOLUS VIA INFUSION
1.0000 mg | INTRAVENOUS | Status: DC | PRN
  Administered 2019-02-10 – 2019-02-14 (×5): 2 mg via INTRAVENOUS
  Filled 2019-02-08: qty 2

## 2019-02-08 MED ORDER — LIDOCAINE HCL (PF) 1 % IJ SOLN
INTRAMUSCULAR | Status: AC
  Administered 2019-02-08: 10 mL
  Filled 2019-02-08: qty 10

## 2019-02-08 MED ORDER — PRO-STAT SUGAR FREE PO LIQD
30.0000 mL | Freq: Three times a day (TID) | ORAL | Status: DC
  Administered 2019-02-08 – 2019-03-09 (×84): 30 mL
  Filled 2019-02-08 (×82): qty 30

## 2019-02-08 MED ORDER — FREE WATER
200.0000 mL | Freq: Three times a day (TID) | Status: DC
  Administered 2019-02-08: 200 mL

## 2019-02-08 MED ORDER — DEXTROSE 50 % IV SOLN
50.0000 mL | Freq: Once | INTRAVENOUS | Status: AC
  Filled 2019-02-08: qty 50

## 2019-02-08 MED ORDER — FENTANYL BOLUS VIA INFUSION
50.0000 ug | INTRAVENOUS | Status: DC | PRN
  Administered 2019-02-11 – 2019-02-14 (×7): 50 ug via INTRAVENOUS
  Filled 2019-02-08: qty 50

## 2019-02-08 MED ORDER — VECURONIUM BOLUS VIA INFUSION
10.0000 mg | Freq: Once | INTRAVENOUS | Status: AC
  Administered 2019-02-08: 10 mg via INTRAVENOUS
  Filled 2019-02-08: qty 10

## 2019-02-08 MED ORDER — NOREPINEPHRINE 4 MG/250ML-% IV SOLN
0.0000 ug/min | INTRAVENOUS | Status: DC
  Administered 2019-02-08: 12 ug/min via INTRAVENOUS
  Administered 2019-02-09 (×2): 10 ug/min via INTRAVENOUS
  Filled 2019-02-08 (×2): qty 250

## 2019-02-08 MED ORDER — FENTANYL CITRATE (PF) 100 MCG/2ML IJ SOLN
50.0000 ug | Freq: Once | INTRAMUSCULAR | Status: AC
  Administered 2019-02-08: 50 ug via INTRAVENOUS

## 2019-02-08 MED ORDER — MIDAZOLAM 50MG/50ML (1MG/ML) PREMIX INFUSION
2.0000 mg/h | INTRAVENOUS | Status: DC
  Administered 2019-02-08 – 2019-02-09 (×7): 10 mg/h via INTRAVENOUS
  Administered 2019-02-10: 8 mg/h via INTRAVENOUS
  Administered 2019-02-10: 10 mg/h via INTRAVENOUS
  Administered 2019-02-10 (×2): 8 mg/h via INTRAVENOUS
  Administered 2019-02-11 (×3): 7 mg/h via INTRAVENOUS
  Administered 2019-02-12: 06:00:00 6 mg/h via INTRAVENOUS
  Administered 2019-02-12: 4 mg/h via INTRAVENOUS
  Filled 2019-02-08 (×16): qty 50

## 2019-02-08 MED ORDER — DEXTROSE 50 % IV SOLN
INTRAVENOUS | Status: AC
  Administered 2019-02-08: 04:00:00 50 mL via INTRAVENOUS
  Filled 2019-02-08: qty 50

## 2019-02-08 MED ORDER — VECURONIUM BROMIDE 10 MG IV SOLR
0.0000 ug/kg/min | INTRAVENOUS | Status: AC
  Administered 2019-02-08: 1 ug/kg/min via INTRAVENOUS
  Administered 2019-02-09: 1.2 ug/kg/min via INTRAVENOUS
  Filled 2019-02-08 (×2): qty 100

## 2019-02-08 MED ORDER — FENTANYL 2500MCG IN NS 250ML (10MCG/ML) PREMIX INFUSION
50.0000 ug/h | INTRAVENOUS | Status: DC
  Administered 2019-02-08: 100 ug/h via INTRAVENOUS
  Administered 2019-02-09 (×2): 200 ug/h via INTRAVENOUS
  Administered 2019-02-10: 16:00:00 150 ug/h via INTRAVENOUS
  Administered 2019-02-10: 300 ug/h via INTRAVENOUS
  Administered 2019-02-11: 150 ug/h via INTRAVENOUS
  Administered 2019-02-11: 200 ug/h via INTRAVENOUS
  Administered 2019-02-12: 150 ug/h via INTRAVENOUS
  Administered 2019-02-12: 250 ug/h via INTRAVENOUS
  Administered 2019-02-13: 300 ug/h via INTRAVENOUS
  Administered 2019-02-14: 125 ug/h via INTRAVENOUS
  Filled 2019-02-08 (×11): qty 250

## 2019-02-08 MED ORDER — DEXTROSE 50 % IV SOLN
12.5000 g | INTRAVENOUS | Status: AC
  Administered 2019-02-08: 12.5 g via INTRAVENOUS

## 2019-02-08 NOTE — Progress Notes (Signed)
 Initial Nutrition Assessment  DOCUMENTATION CODES:   Not applicable  INTERVENTION:   Tube Feeding: Vital 1.5 at 50 ml/hr Pro-Stat 30 mL TID Provides 2100 kcals, 126 g of protein and 912 mL of free water   Additional free water: 200 mL q 8 hours: total 1512 mL  NUTRITION DIAGNOSIS:   Inadequate oral intake related to acute illness as evidenced by NPO status.  GOAL:   Patient will meet greater than or equal to 90% of their needs  MONITOR:   TF tolerance, Vent status, Labs, Weight trends  REASON FOR ASSESSMENT:   Consult, Ventilator Enteral/tube feeding initiation and management  ASSESSMENT:   54 yo male admitted with acute respiratory failure due to COVID-19 pneumonia, ARDS requiring intubation, shock. No PMH  RD working remotely.  1/23 Admit  Patient is currently intubated on ventilator support, sedated on fentanyl and precedex drips, requiring levophed MV: 8.7 L/min Temp (24hrs), Avg:100.3 F (37.9 C), Min:98.4 F (36.9 C), Max:103.5 F (39.7 C)   Unable to obtain diet and weight history at this time  Abd xray: OG tube coiled in stomach  Labs: sodium 146 (H), phosphorus 7.0 (H), Creatinine wdl; CBGs 55-137 Meds: Vitamin C, decadron, folic acid, ss novolog, MVI levemir, thiamine, zinc sulfate   Diet Order:   Diet Order            Diet NPO time specified  Diet effective now              EDUCATION NEEDS:   Not appropriate for education at this time  Skin:  Skin Assessment: Reviewed RN Assessment  Last BM:  1/24  Height:   Ht Readings from Last 1 Encounters:  02/06/19 5\' 6"  (1.676 m)    Weight:   Wt Readings from Last 1 Encounters:  02/06/19 80.3 kg    BMI:  Body mass index is 28.57 kg/m.  Estimated Nutritional Needs:   Kcal:  2220 kcals  Protein:  120-145 g  Fluid:  >/= 2 L    Isai Gottlieb MS, RDN, LDN, CNSC [phone] Pager  [phone] Weekend/On-Call Pager

## 2019-02-08 NOTE — Progress Notes (Signed)
Updated pt daughter via phone.  All questions answered.

## 2019-02-08 NOTE — Procedures (Signed)
Central Venous Catheter Insertion Procedure Note Andrew Bruce [id number] 08-05-1965  Procedure: Insertion of Central Venous Catheter Indications: Assessment of intravascular volume  Procedure Details Consent: Unable to obtain consent because of altered level of consciousness. Time Out: Verified patient identification, verified procedure, site/side was marked, verified correct patient position, special equipment/implants available, medications/allergies/relevent history reviewed, required imaging and test results available.  Performed Real time [place] used to ID and cannulate vessel    Maximum sterile technique was used including antiseptics, cap, gloves, gown, hand hygiene, mask and sheet. Skin prep: Chlorhexidine; local anesthetic administered A antimicrobial bonded/coated triple lumen catheter was placed in the right femoral vein due to emergent situation using the Seldinger technique. And I was not able to visualize the vessel d/t [name] emphysema  Evaluation Blood flow good Complications: No apparent complications Patient did tolerate procedure well.   Andrew Bruce 02/08/2019, 1:47 PM  Andrew Bruce ACNP-BC [facility] Pulmonary/Critical Care Pager # [phone] OR # [phone] if no answer

## 2019-02-08 NOTE — Procedures (Signed)
Chest Tube Insertion Procedure Note  Indications:  Clinically significant Pneumothorax  Pre-operative Diagnosis: Pneumothorax  Post-operative Diagnosis: Pneumothorax  Procedure Details  Informed consent was obtained for the procedure, including sedation.  Risks of lung perforation, hemorrhage, arrhythmia, and adverse drug reaction were discussed.   After sterile skin prep, using standard technique, a 28 French tube was placed in the left lateral 7th rib space.  Findings: None Immediate rush of air. Pleural fluid tidal. No subsequent airleak   Estimated Blood Loss:  Minimal         Specimens:  None              Complications:  None; patient tolerated the procedure well.         Disposition: ICU - intubated and critically ill.         Condition: stable  hospital

## 2019-02-08 NOTE — Progress Notes (Signed)
Inpatient Diabetes Program Recommendations  AACE/ADA: New Consensus Statement on Inpatient Glycemic Control (2015)  Target Ranges:  Prepandial:   less than 140 mg/dL      Peak postprandial:   less than 180 mg/dL (1-2 hours)      Critically ill patients:  140 - 180 mg/dL   Lab Results  Component Value Date   GLUCAP 75 02/08/2019   HGBA1C 12.6 (H) 02/06/2019    Review of Glycemic Control Results for Andrew Bruce, Andrew Bruce (MRN [id number]) as of 02/08/2019 09:53  Ref. Range 02/07/2019 23:26 02/08/2019 03:19 02/08/2019 03:49 02/08/2019 07:37  Glucose-Capillary Latest Ref Range: 70 - 99 mg/dL 88 44 (LL) 286 (H) 75   Diabetes history: New onset Outpatient Diabetes medications: none Current orders for Inpatient glycemic control: Levemir 12 units BId, Novolog 0-15 units Q4H Decadron 6 mg QD  Inpatient Diabetes Program Recommendations:    Noted hypoglycemic episode of 44 mg/dL. Consider decreasing Levemir to 6 units BID.   Thanks, Lujean Rave, MSN, RNC-OB Diabetes Coordinator [phone] (8a-5p)

## 2019-02-08 NOTE — Progress Notes (Signed)
Patient's head repositioned and arms rotated.  Patient tolerated well.  No skin breakdown noted at this time. ETT remains secure. No complications.

## 2019-02-08 NOTE — Progress Notes (Signed)
NAME:  Andrew Bruce, MRN:  [id number], DOB:  1965-06-07, LOS: 2 ADMISSION DATE:  02/06/2019, CONSULTATION DATE:  02/06/19 REFERRING MD:  ER, CHIEF COMPLAINT:  SOB   Brief History   This is a 54 year old man without a past medical history who presents with worsening shortness of breath over the past 2 days in the context of a 10-day Covid illness.  Chest x-ray and vitals are consistent with a worsening Covid ARDS.  History of present illness   This is a 54 year old man without a past medical history who presents with worsening shortness of breath over the past 2 days in the context of a 10-day Covid illness.  Chest x-ray and vitals are consistent with a worsening Covid ARDS.  History performed with Spanish interpreter as patient is Spanish-speaking only.  He states he was feeling fatigued but stable since an ER visit last week for Covid.  Over the past 2 days he has noticed a dramatic increase in his shortness of breath.  He is dyspneic even speaking.  He was placed on a nonrebreather and nasal cannula in the ER and is still mildly tachypneic with sats in the 80s.  He denies chest pain, productive cough.  He does have a dry cough with inspiration that is paroxysmal.  He denies any leg swelling.  He has lost his sense of taste and smell.  PCCM consulted for admission.  Patient has never been hospitalized for.  He has no chronic medical issues and is on no home medications.  Past Medical History  None  Significant Hospital Events   02/06/2019 admitted  Consults:  None  Procedures:  None  Significant Diagnostic Tests:  Chest x-ray with an ARDS pattern  Micro Data:  Covid positive Pct Neg  Antimicrobials:  Remdesivir 02/06/19>> Dexamethasone 02/06/19>>   Interim history/subjective:  Changed to PCV overnight Precedex dosing increased overnight due to vent dyssynchrony I/O+ 2.7 L Norepinephrine 6 FiO2 1.00, PEEP 16.  Currently PCV 24, P plat 27 Hypoglycemic episode 1/25  Objective     Blood pressure (!) 93/57, pulse 73, temperature 99.4 F (37.4 C), temperature source Axillary, resp. rate 17, height 5\' 6"  (1.676 m), weight 80.3 kg, SpO2 93 %.    Vent Mode: PCV FiO2 (%):  [60 %-100 %] 100 % Set Rate:  [20 bmp-24 bmp] 24 bmp PEEP:  [5 cmH20-16 cmH20] 16 cmH20 Plateau Pressure:  [17 cmH20-27 cmH20] 27 cmH20   Intake/Output Summary (Last 24 hours) at 02/08/2019 02/10/2019 Last data filed at 02/08/2019 0800 Gross per 24 hour  Intake 3172.4 ml  Output 695 ml  Net 2477.4 ml   Filed Weights   02/06/19 1251  Weight: 80.3 kg   Examination: General: Ill-appearing young man, ventilated and sedated HENT: ET tube in place Lungs: Coarse bilateral breath sounds, no wheezing Cardiovascular: Regular, borderline tachycardic, distant, no murmur Abdomen: Soft, nondistended with hypoactive bowel sounds Extremities: No significant edema Neuro: Sedated, he is breathing over the set rate on mechanical ventilation, RASS -3, -4 Skin: N no rash  No chest x-ray 1/25   Resolved Hospital Problem list   N/A  Assessment & Plan:  Acute hypoxemic respiratory failure due to COVID-19 pneumonia, ARDS Mechanical ventilation, currently on pressure control ventilation.  Goal changed to via ARDS protocol on 1/25, target PRVC 6 cc/kg Wean PEEP and FiO2 as able Goal plateau pressure less than 30, driving pressure less than 15 Paralytics if necessary for vent synchrony, gas exchange.  May need to start on 1/25 to accomplish  transition to [facility] if deeper sedation inadequate Cycle prone positioning to begin 1/25 Deep sedation per PAD protocol, goal RASS -4, currently Precedex and fentanyl.  Likely change to midazolam 1/25 Diuresis as blood pressure and renal function can tolerate, goal CVP 5-8 VAP prevention order set  COVID-19 pneumonia/ARDS Continue remdesivir, plan 5 days Continue dexamethasone plan 10 days Full anticoagulation  Shock, due to sedating medications also likely septic shock Wean  norepinephrine as able.  Goal MAP 65 We will need central IV access 1/25.  Attempt to place PICC, if unable then CVC placement today.  Hyperglycemia, hypoglycemic episode 1/25 Sliding-scale insulin as per protocol Levemir 12 units twice daily.  May need to hold 1/25 until TF started Initiate tube feeding 1/25, note hypoglycemic episode  Mild transaminitis Follow LFT  Need for sedation to facilitate mechanical ventilation PAD protocol.  Plan to transition Precedex to midazolam on 1/25  At risk malnutrition Nutrition consult for tube feeding 1/25  Best practice:  Diet: NPO, TF Pain/Anxiety/Delirium protocol (if indicated): Ordered, transition Precedex to midazolam VAP protocol (if indicated): Ordered DVT prophylaxis: Full dose Lovenox GI prophylaxis: Pepcid Glucose control: SSI and Levemir Mobility: Bedrest Code Status: Full Family Communication: Updated daughter Judeth Cornfield by phone 1/25.  Disposition: ICU    Labs   CBC: Recent Labs  Lab 02/06/19 0654 02/07/19 0401 02/07/19 1453 02/08/19 0225 02/08/19 0422  WBC 15.8* 19.8*  --   --   --   NEUTROABS 13.4* 16.7*  --   --   --   HGB 13.4 13.3 13.3 13.3 13.3  HCT 43.7 41.6 39.0 39.0 39.0  MCV 89.9 87.6  --   --   --   PLT 306 290  --   --   --     Basic Metabolic Panel: Recent Labs  Lab 02/06/19 0654 02/07/19 0401 02/07/19 1453 02/08/19 0225 02/08/19 0422  NA 135 143 141 144 143  K 4.8 3.8 4.0 4.3 4.4  CL 99 108  --   --   --   CO2 16* 22  --   --   --   GLUCOSE 473* 93  --   --   --   BUN 20 16  --   --   --   CREATININE 1.18 0.66  --   --   --   CALCIUM 7.6* 8.1*  --   --   --    GFR: Estimated Creatinine Clearance: 107.6 mL/min (by C-G formula based on SCr of 0.66 mg/dL). Recent Labs  Lab 02/06/19 0654 02/06/19 0717 02/06/19 1008 02/07/19 0401  PROCALCITON 0.17  --   --   --   WBC 15.8*  --   --  19.8*  LATICACIDVEN  --  6.0* 3.6*  --     Liver Function Tests: Recent Labs  Lab 02/06/19 0654  02/07/19 0401  AST 78* 120*  ALT 62* 76*  ALKPHOS 103 105  BILITOT 1.1 0.8  PROT 6.3* 6.2*  ALBUMIN 2.2* 2.3*   No results for input(s): LIPASE, AMYLASE in the last 168 hours. No results for input(s): AMMONIA in the last 168 hours.  ABG    Component Value Date/Time   PHART 7.271 (L) 02/08/2019 0422   PCO2ART 63.5 (H) 02/08/2019 0422   PO2ART 83.0 02/08/2019 0422   HCO3 28.6 (H) 02/08/2019 0422   TCO2 30 02/08/2019 0422   ACIDBASEDEF 1.0 02/08/2019 0225   O2SAT 92.0 02/08/2019 0422     Coagulation Profile: No results for input(s): INR,  PROTIME in the last 168 hours.  Cardiac Enzymes: No results for input(s): CKTOTAL, CKMB, CKMBINDEX, TROPONINI in the last 168 hours.  HbA1C: Hgb A1c MFr Bld  Date/Time Value Ref Range Status  02/06/2019 06:54 AM 12.6 (H) 4.8 - 5.6 % Final    Comment:    (NOTE) Pre diabetes:          5.7%-6.4% Diabetes:              >6.4% Glycemic control for   <7.0% adults with diabetes     CBG: Recent Labs  Lab 02/07/19 1922 02/07/19 2326 02/08/19 0319 02/08/19 0349 02/08/19 0737  GLUCAP 148* 88 44* 127* 75   The patient is critically ill with multiple organ systems failure and requires high complexity decision making for assessment and support, frequent evaluation and titration of therapies, application of advanced monitoring technologies and extensive interpretation of multiple databases.   Independent critical care time 33 minutes  Baltazar Apo, MD, PhD 02/08/2019, 8:34 AM [place] [phone] or if no answer [phone]

## 2019-02-08 NOTE — Progress Notes (Signed)
PT Cancellation Note  Patient Details Name: Andrew Bruce MRN: [id number] DOB: 13-Dec-1965   Cancelled Treatment:    Reason Eval/Treat Not Completed: Medical issues which prohibited therapy(Pt intubated after order for PT written. Please reorder PT if pt becomes appropriate.)Will sign off per nurse request.    [name] 02/08/2019, 12:32 PM [name] W,PT Acute Rehabilitation Services Pager:  [phone]  Office:  [phone]

## 2019-02-08 NOTE — Progress Notes (Signed)
eLink Physician-Brief Progress Note Patient Name: Andrew Bruce DOB: 15-Jun-1965 MRN: [id number]   Date of Service  02/08/2019  HPI/Events of Note  ABG on 100%/PC 12/Rate 24/P 16 = 7.27/63.5/83.0  eICU Interventions  Continue present ventilator management.     Intervention Category Major Interventions: Acid-Base disturbance - evaluation and management;Respiratory failure - evaluation and management  Lakeyshia Tuckerman Dennard Nip 02/08/2019, 4:44 AM

## 2019-02-08 NOTE — Progress Notes (Addendum)
Patient's head repositioned and arms rotated.  Patient tolerated well.  No skin breakdown noted at this time. ETT remains secure.  

## 2019-02-08 NOTE — Progress Notes (Signed)
eLink Physician-Brief Progress Note Patient Name: Andrew Bruce DOB: [birth year] MRN: [id number]   Date of Service  02/08/2019  HPI/Events of Note  Ventilator Asynchrony - In setting of COVID pneumonia. Patient change to 100%/PC 12/Rate 20/P 16 by RT at 9 PM on 02/07/2019.Marland Kitchen  eICU Interventions  Will order: 1. Increase ceiling on Precedex IV infusion to 1.6 mcg/kg/hour.  2. ABG STAT.     Intervention Category Major Interventions: Respiratory failure - evaluation and management  [name] 02/08/2019, 1:15 AM

## 2019-02-08 NOTE — Progress Notes (Signed)
PCCM Interval Note  I updated the patient's daughter Andrew Bruce about Patient's CVC placement, about discovery of Subcutaneous air and then PTX, chest tube placement. All questions answered.   Levy Pupa, MD, PhD 02/08/2019, 1:34 PM [facility] [phone] or if no answer [phone]

## 2019-02-08 NOTE — Progress Notes (Signed)
Hypoglycemic Event  CBG: 44  Treatment: D50 50 mL (25 gm)  Symptoms: None  Follow-up CBG: Time:0349 CBG Result:127  Possible Reasons for Event: Inadequate meal intake     Irwin Brakeman

## 2019-02-08 NOTE — Progress Notes (Signed)
eLink Physician-Brief Progress Note Patient Name: Andrew Bruce DOB: 23-Feb-1965 MRN: [id number]   Date of Service  02/08/2019  HPI/Events of Note  ABG on 100%/PC 12/Rate 20/P 16 = 7.25/61.7/95/26.  eICU Interventions  Will order: 1. Increase Rate to 24. 2 Repeat ABG at 5 AM.     Intervention Category Major Interventions: Respiratory failure - evaluation and management;Acid-Base disturbance - evaluation and management  Corwyn Vora Eugene 02/08/2019, 2:51 AM

## 2019-02-08 NOTE — Progress Notes (Signed)
Rt Note:  Rate increased to 24 per Elink.  Repeat ABG @ 05:00.

## 2019-02-08 NOTE — Progress Notes (Signed)
RT NOTE: CCM made the following changes to ventilator: mode to PRVC, VT 400, RR 34, 100% FIO2,  and PEEP of 14. ABG to be obtained in one hour. Vitals are stable. RT will continue to monitor.

## 2019-02-08 NOTE — Procedures (Signed)
Chest Tube Insertion Procedure Note  Indications:  Clinically significant Pneumothorax  Pre-operative Diagnosis: Pneumothorax  Post-operative Diagnosis: Pneumothorax  Procedure Details  Informed consent was obtained for the procedure, including sedation.  Risks of lung perforation, hemorrhage, arrhythmia, and adverse drug reaction were discussed.   After sterile skin prep, using standard technique, a 28 French tube was placed in the right lateral 7th rib space.  Findings: Immediate rush of air.   Estimated Blood Loss:  Minimal         Specimens:  None              Complications:  None; patient tolerated the procedure well.         Disposition: ICU - intubated and critically ill.         Condition: stable  Simonne Martinet ACNP-BC [facility] Pulmonary/Critical Care Pager # [phone] OR # [phone] if no answer

## 2019-02-08 NOTE — Progress Notes (Signed)
RT NOTE: RT removed ETT holder and taped ETT in preparation for proning. Protective foam placed over cheeks and lip prior to taping. RT assisted in proning patient. ABG to be drawn in one hour. Vitals are stable. RT will continue to monitor.

## 2019-02-09 ENCOUNTER — Inpatient Hospital Stay (HOSPITAL_COMMUNITY): Payer: HRSA Program

## 2019-02-09 LAB — CBC WITH DIFFERENTIAL/PLATELET
Abs Immature Granulocytes: 0.17 10*3/uL — ABNORMAL HIGH (ref 0.00–0.07)
Basophils Absolute: 0 10*3/uL (ref 0.0–0.1)
Basophils Relative: 0 %
Eosinophils Absolute: 0 10*3/uL (ref 0.0–0.5)
Eosinophils Relative: 0 %
HCT: 42.5 % (ref 39.0–52.0)
Hemoglobin: 12.9 g/dL — ABNORMAL LOW (ref 13.0–17.0)
Immature Granulocytes: 1 %
Lymphocytes Relative: 9 %
Lymphs Abs: 1.1 10*3/uL (ref 0.7–4.0)
MCH: 27.7 pg (ref 26.0–34.0)
MCHC: 30.4 g/dL (ref 30.0–36.0)
MCV: 91.2 fL (ref 80.0–100.0)
Monocytes Absolute: 0.7 10*3/uL (ref 0.1–1.0)
Monocytes Relative: 5 %
Neutro Abs: 10.1 10*3/uL — ABNORMAL HIGH (ref 1.7–7.7)
Neutrophils Relative %: 85 %
Platelets: 327 10*3/uL (ref 150–400)
RBC: 4.66 MIL/uL (ref 4.22–5.81)
RDW: 13.4 % (ref 11.5–15.5)
WBC: 12.1 10*3/uL — ABNORMAL HIGH (ref 4.0–10.5)
nRBC: 0.2 % (ref 0.0–0.2)

## 2019-02-09 LAB — POCT I-STAT 7, (LYTES, BLD GAS, ICA,H+H)
Acid-Base Excess: 2 mmol/L (ref 0.0–2.0)
Bicarbonate: 26.7 mmol/L (ref 20.0–28.0)
Calcium, Ion: 1.16 mmol/L (ref 1.15–1.40)
HCT: 40 % (ref 39.0–52.0)
Hemoglobin: 13.6 g/dL (ref 13.0–17.0)
O2 Saturation: 98 %
Patient temperature: 97.7
Potassium: 3.7 mmol/L (ref 3.5–5.1)
Sodium: 146 mmol/L — ABNORMAL HIGH (ref 135–145)
TCO2: 28 mmol/L (ref 22–32)
pCO2 arterial: 41.5 mmHg (ref 32.0–48.0)
pH, Arterial: 7.415 (ref 7.350–7.450)
pO2, Arterial: 104 mmHg (ref 83.0–108.0)

## 2019-02-09 LAB — MAGNESIUM
Magnesium: 2.3 mg/dL (ref 1.7–2.4)
Magnesium: 2.5 mg/dL — ABNORMAL HIGH (ref 1.7–2.4)

## 2019-02-09 LAB — COMPREHENSIVE METABOLIC PANEL
ALT: 43 U/L (ref 0–44)
AST: 22 U/L (ref 15–41)
Albumin: 1.7 g/dL — ABNORMAL LOW (ref 3.5–5.0)
Alkaline Phosphatase: 75 U/L (ref 38–126)
Anion gap: 9 (ref 5–15)
BUN: 25 mg/dL — ABNORMAL HIGH (ref 6–20)
CO2: 24 mmol/L (ref 22–32)
Calcium: 7.4 mg/dL — ABNORMAL LOW (ref 8.9–10.3)
Chloride: 112 mmol/L — ABNORMAL HIGH (ref 98–111)
Creatinine, Ser: 0.87 mg/dL (ref 0.61–1.24)
GFR calc Af Amer: >60 mL/min (ref >60)
GFR calc non Af Amer: >60 mL/min (ref >60)
Glucose, Bld: 294 mg/dL — ABNORMAL HIGH (ref 70–99)
Potassium: 4 mmol/L (ref 3.5–5.1)
Sodium: 145 mmol/L (ref 135–145)
Total Bilirubin: 0.8 mg/dL (ref 0.3–1.2)
Total Protein: 5.3 g/dL — ABNORMAL LOW (ref 6.5–8.1)

## 2019-02-09 LAB — PHOSPHORUS
Phosphorus: 3.2 mg/dL (ref 2.5–4.6)
Phosphorus: 2.5 mg/dL (ref 2.5–4.6)

## 2019-02-09 LAB — GLUCOSE, CAPILLARY
Glucose-Capillary: 293 mg/dL — ABNORMAL HIGH (ref 70–99)
Glucose-Capillary: 277 mg/dL — ABNORMAL HIGH (ref 70–99)
Glucose-Capillary: 256 mg/dL — ABNORMAL HIGH (ref 70–99)
Glucose-Capillary: 241 mg/dL — ABNORMAL HIGH (ref 70–99)
Glucose-Capillary: 251 mg/dL — ABNORMAL HIGH (ref 70–99)

## 2019-02-09 LAB — C-REACTIVE PROTEIN: CRP: 12.9 mg/dL — ABNORMAL HIGH (ref <1.0)

## 2019-02-09 LAB — D-DIMER, QUANTITATIVE: D-Dimer, Quant: 2.24 ug/mL-FEU — ABNORMAL HIGH (ref 0.00–0.50)

## 2019-02-09 LAB — HEPARIN ANTI-XA: Heparin LMW: 1.08 IU/mL

## 2019-02-09 MED ORDER — ALBUMIN HUMAN 25 % IV SOLN
25.0000 g | Freq: Four times a day (QID) | INTRAVENOUS | Status: AC
  Administered 2019-02-09 – 2019-02-10 (×4): 25 g via INTRAVENOUS
  Filled 2019-02-09: qty 50
  Filled 2019-02-09 (×3): qty 100

## 2019-02-09 MED ORDER — INSULIN DETEMIR 100 UNIT/ML ~~LOC~~ SOLN
20.0000 [IU] | Freq: Two times a day (BID) | SUBCUTANEOUS | Status: DC
  Filled 2019-02-09 (×2): qty 0.2

## 2019-02-09 MED ORDER — INSULIN ASPART 100 UNIT/ML ~~LOC~~ SOLN
0.0000 [IU] | SUBCUTANEOUS | Status: DC
  Administered 2019-02-09 (×2): 11 [IU] via SUBCUTANEOUS
  Administered 2019-02-09 – 2019-02-10 (×2): 7 [IU] via SUBCUTANEOUS
  Administered 2019-02-10: 3 [IU] via SUBCUTANEOUS
  Administered 2019-02-10 (×2): 7 [IU] via SUBCUTANEOUS
  Administered 2019-02-10: 4 [IU] via SUBCUTANEOUS
  Administered 2019-02-11 (×2): 7 [IU] via SUBCUTANEOUS
  Administered 2019-02-11: 4 [IU] via SUBCUTANEOUS
  Administered 2019-02-11: 15 [IU] via SUBCUTANEOUS
  Administered 2019-02-11: 11 [IU] via SUBCUTANEOUS
  Administered 2019-02-11: 17:00:00 15 [IU] via SUBCUTANEOUS
  Administered 2019-02-12: 7 [IU] via SUBCUTANEOUS
  Administered 2019-02-12 (×2): 4 [IU] via SUBCUTANEOUS
  Administered 2019-02-12: 7 [IU] via SUBCUTANEOUS
  Administered 2019-02-12: 3 [IU] via SUBCUTANEOUS
  Administered 2019-02-13: 4 [IU] via SUBCUTANEOUS
  Administered 2019-02-13: 13:00:00 11 [IU] via SUBCUTANEOUS
  Administered 2019-02-13: 7 [IU] via SUBCUTANEOUS
  Administered 2019-02-13: 11 [IU] via SUBCUTANEOUS
  Administered 2019-02-13: 3 [IU] via SUBCUTANEOUS
  Administered 2019-02-13 – 2019-02-14 (×2): 11 [IU] via SUBCUTANEOUS
  Administered 2019-02-14 (×2): 3 [IU] via SUBCUTANEOUS
  Administered 2019-02-14: 11 [IU] via SUBCUTANEOUS
  Administered 2019-02-14: 4 [IU] via SUBCUTANEOUS
  Administered 2019-02-14: 7 [IU] via SUBCUTANEOUS
  Administered 2019-02-15: 4 [IU] via SUBCUTANEOUS
  Administered 2019-02-15: 3 [IU] via SUBCUTANEOUS
  Administered 2019-02-15 (×2): 4 [IU] via SUBCUTANEOUS
  Administered 2019-02-16 (×4): 7 [IU] via SUBCUTANEOUS
  Administered 2019-02-16: 3 [IU] via SUBCUTANEOUS
  Administered 2019-02-16 – 2019-02-17 (×2): 4 [IU] via SUBCUTANEOUS
  Administered 2019-02-17: 15 [IU] via SUBCUTANEOUS
  Administered 2019-02-17: 7 [IU] via SUBCUTANEOUS
  Administered 2019-02-17: 4 [IU] via SUBCUTANEOUS
  Administered 2019-02-17: 15 [IU] via SUBCUTANEOUS
  Administered 2019-02-18 (×2): 7 [IU] via SUBCUTANEOUS
  Administered 2019-02-18: 3 [IU] via SUBCUTANEOUS
  Administered 2019-02-19 (×2): 7 [IU] via SUBCUTANEOUS
  Administered 2019-02-19: 3 [IU] via SUBCUTANEOUS
  Administered 2019-02-19 (×2): 11 [IU] via SUBCUTANEOUS
  Administered 2019-02-19: 15 [IU] via SUBCUTANEOUS
  Administered 2019-02-20: 7 [IU] via SUBCUTANEOUS
  Administered 2019-02-20 (×2): 3 [IU] via SUBCUTANEOUS
  Administered 2019-02-20: 7 [IU] via SUBCUTANEOUS
  Administered 2019-02-20: 11 [IU] via SUBCUTANEOUS
  Administered 2019-02-20: 4 [IU] via SUBCUTANEOUS
  Administered 2019-02-20 – 2019-02-21 (×4): 7 [IU] via SUBCUTANEOUS
  Administered 2019-02-21 (×2): 4 [IU] via SUBCUTANEOUS
  Administered 2019-02-22: 3 [IU] via SUBCUTANEOUS
  Administered 2019-02-22: 4 [IU] via SUBCUTANEOUS
  Administered 2019-02-22: 3 [IU] via SUBCUTANEOUS
  Administered 2019-02-23: 4 [IU] via SUBCUTANEOUS
  Administered 2019-02-23: 3 [IU] via SUBCUTANEOUS
  Administered 2019-02-23: 4 [IU] via SUBCUTANEOUS
  Administered 2019-02-24 (×2): 3 [IU] via SUBCUTANEOUS
  Administered 2019-02-25 – 2019-02-26 (×2): 4 [IU] via SUBCUTANEOUS
  Administered 2019-02-27: 3 [IU] via SUBCUTANEOUS
  Administered 2019-02-27: 4 [IU] via SUBCUTANEOUS
  Administered 2019-02-27: 3 [IU] via SUBCUTANEOUS
  Administered 2019-02-28: 08:00:00 4 [IU] via SUBCUTANEOUS
  Administered 2019-02-28: 3 [IU] via SUBCUTANEOUS
  Administered 2019-02-28 (×2): 4 [IU] via SUBCUTANEOUS
  Administered 2019-03-01 – 2019-03-02 (×3): 3 [IU] via SUBCUTANEOUS
  Administered 2019-03-03 (×2): 4 [IU] via SUBCUTANEOUS
  Administered 2019-03-03 – 2019-03-04 (×2): 3 [IU] via SUBCUTANEOUS
  Administered 2019-03-04 – 2019-03-05 (×2): 4 [IU] via SUBCUTANEOUS
  Administered 2019-03-06 (×3): 3 [IU] via SUBCUTANEOUS
  Administered 2019-03-07 (×3): 4 [IU] via SUBCUTANEOUS
  Administered 2019-03-08: 7 [IU] via SUBCUTANEOUS
  Administered 2019-03-08 – 2019-03-11 (×3): 3 [IU] via SUBCUTANEOUS
  Administered 2019-03-12 (×2): 7 [IU] via SUBCUTANEOUS
  Administered 2019-03-12: 11 [IU] via SUBCUTANEOUS
  Administered 2019-03-12: 15 [IU] via SUBCUTANEOUS
  Administered 2019-03-12 – 2019-03-13 (×2): 4 [IU] via SUBCUTANEOUS
  Administered 2019-03-13 (×2): 15 [IU] via SUBCUTANEOUS
  Administered 2019-03-13: 7 [IU] via SUBCUTANEOUS
  Administered 2019-03-14: 4 [IU] via SUBCUTANEOUS
  Administered 2019-03-14: 11 [IU] via SUBCUTANEOUS
  Administered 2019-03-14: 7 [IU] via SUBCUTANEOUS
  Administered 2019-03-14 (×2): 11 [IU] via SUBCUTANEOUS
  Administered 2019-03-14: 4 [IU] via SUBCUTANEOUS
  Administered 2019-03-15: 7 [IU] via SUBCUTANEOUS
  Administered 2019-03-15 (×2): 4 [IU] via SUBCUTANEOUS
  Administered 2019-03-15 (×2): 7 [IU] via SUBCUTANEOUS
  Administered 2019-03-15 – 2019-03-16 (×2): 4 [IU] via SUBCUTANEOUS
  Administered 2019-03-16: 7 [IU] via SUBCUTANEOUS
  Administered 2019-03-16: 4 [IU] via SUBCUTANEOUS
  Administered 2019-03-16: 11 [IU] via SUBCUTANEOUS
  Administered 2019-03-16 – 2019-03-17 (×2): 4 [IU] via SUBCUTANEOUS
  Administered 2019-03-17 (×2): 3 [IU] via SUBCUTANEOUS
  Administered 2019-03-17: 4 [IU] via SUBCUTANEOUS
  Administered 2019-03-17 – 2019-03-18 (×3): 3 [IU] via SUBCUTANEOUS
  Administered 2019-03-18: 4 [IU] via SUBCUTANEOUS
  Administered 2019-03-18: 3 [IU] via SUBCUTANEOUS
  Administered 2019-03-18: 4 [IU] via SUBCUTANEOUS
  Administered 2019-03-18: 3 [IU] via SUBCUTANEOUS
  Administered 2019-03-18 – 2019-03-19 (×4): 4 [IU] via SUBCUTANEOUS
  Administered 2019-03-19: 3 [IU] via SUBCUTANEOUS
  Administered 2019-03-20: 4 [IU] via SUBCUTANEOUS
  Administered 2019-03-20 (×2): 3 [IU] via SUBCUTANEOUS
  Administered 2019-03-20 – 2019-03-21 (×5): 4 [IU] via SUBCUTANEOUS
  Administered 2019-03-21: 3 [IU] via SUBCUTANEOUS
  Administered 2019-03-21 – 2019-03-22 (×3): 4 [IU] via SUBCUTANEOUS
  Administered 2019-03-22: 3 [IU] via SUBCUTANEOUS
  Administered 2019-03-22: 4 [IU] via SUBCUTANEOUS
  Administered 2019-03-22 – 2019-03-23 (×5): 3 [IU] via SUBCUTANEOUS
  Administered 2019-03-23: 4 [IU] via SUBCUTANEOUS

## 2019-02-09 MED ORDER — INSULIN DETEMIR 100 UNIT/ML ~~LOC~~ SOLN
15.0000 [IU] | Freq: Two times a day (BID) | SUBCUTANEOUS | Status: DC
  Administered 2019-02-09 (×2): 15 [IU] via SUBCUTANEOUS
  Filled 2019-02-09 (×4): qty 0.15

## 2019-02-09 MED ORDER — SODIUM CHLORIDE 0.9 % IV SOLN
0.0000 ug/kg/min | INTRAVENOUS | Status: DC
  Administered 2019-02-09: 1 ug/kg/min via INTRAVENOUS
  Administered 2019-02-10 – 2019-02-11 (×2): 2 ug/kg/min via INTRAVENOUS
  Filled 2019-02-09 (×3): qty 20

## 2019-02-09 MED ORDER — FUROSEMIDE 10 MG/ML IJ SOLN
40.0000 mg | Freq: Four times a day (QID) | INTRAMUSCULAR | Status: AC
  Administered 2019-02-09 (×2): 40 mg via INTRAVENOUS
  Filled 2019-02-09 (×2): qty 4

## 2019-02-09 MED ORDER — FREE WATER
300.0000 mL | Freq: Three times a day (TID) | Status: DC
  Administered 2019-02-09 – 2019-02-10 (×3): 300 mL

## 2019-02-09 NOTE — Progress Notes (Signed)
RT NOTE: Patient's head repositioned and arms rotated. Patient tolerated well. ETT is secure and no skin breakdown noted at this time. Vitals are stable. RT will continue to monitor.

## 2019-02-09 NOTE — Progress Notes (Signed)
  Patient's head repositioned and arms rotated.  Patient tolerated well.  No skin breakdown noted at this time.  ETT secured 25 cm @lip .

## 2019-02-09 NOTE — Progress Notes (Signed)
Patient's head repositioned and arms rotated.  Patient tolerated well.  No skin breakdown noted at this time. ETT remains secure.  

## 2019-02-09 NOTE — Progress Notes (Addendum)
Inpatient Diabetes Program Recommendations  AACE/ADA: New Consensus Statement on Inpatient Glycemic Control (2015)  Target Ranges:  Prepandial:   less than 140 mg/dL      Peak postprandial:   less than 180 mg/dL (1-2 hours)      Critically ill patients:  140 - 180 mg/dL   Lab Results  Component Value Date   GLUCAP 277 (H) 02/09/2019   HGBA1C 12.6 (H) 02/06/2019    Review of Glycemic Control Results for QUINNLAN, ABRUZZO (MRN [id number]) as of 02/09/2019 09:47  Ref. Range 02/08/2019 23:03 02/09/2019 03:42 02/09/2019 07:09  Glucose-Capillary Latest Ref Range: 70 - 99 mg/dL 670 (H) 110 (H) 034 (H)   Diabetes history: New onset Outpatient Diabetes medications: none Current orders for Inpatient glycemic control: Levemir 15 units BID, Novolog 0-15 units Q4H Decadron 6 mg QD Vital 1.5 @50ml /hr restarted  Inpatient Diabetes Program Recommendations:    Anticipate patient will experience further hypoglycemia with current basal dose once tube feeds discontinued.  Patient experienced hypoglycemia on 1/25 prior to starting tube feeds.   Consider: -Decreasing Levemir to 8 units BID (to avoid lows once TF are DCd)  -Add Novolog 4 units Q4H for tube feed coverage (to be stopped once tube feeds are stopped or held).   Thanks, 2/25, MSN, RNC-OB Diabetes Coordinator [phone] (8a-5p)

## 2019-02-09 NOTE — Progress Notes (Signed)
Assisted tele visit to patient with daughter.  Amantha Sklar Anderson, RN   

## 2019-02-09 NOTE — Progress Notes (Signed)
NAME:  Andrew Bruce, MRN:  [id number], DOB:  05-26-1965, LOS: 3 ADMISSION DATE:  02/06/2019, CONSULTATION DATE:  02/06/19 REFERRING MD:  ER, CHIEF COMPLAINT:  SOB   Brief History   This is a 54 year old man without a past medical history who presents with worsening shortness of breath over the past 2 days in the context of a 10-day Covid illness.  Chest x-ray and vitals are consistent with a worsening Covid ARDS.  History of present illness   This is a 54 year old man without a past medical history who presents with worsening shortness of breath over the past 2 days in the context of a 10-day Covid illness.  Chest x-ray and vitals are consistent with a worsening Covid ARDS.  History performed with Spanish interpreter as patient is Spanish-speaking only.  He states he was feeling fatigued but stable since an ER visit last week for Covid.  Over the past 2 days he has noticed a dramatic increase in his shortness of breath.  He is dyspneic even speaking.  He was placed on a nonrebreather and nasal cannula in the ER and is still mildly tachypneic with sats in the 80s.  He denies chest pain, productive cough.  He does have a dry cough with inspiration that is paroxysmal.  He denies any leg swelling.  He has lost his sense of taste and smell.  PCCM consulted for admission.  Patient has never been hospitalized for.  He has no chronic medical issues and is on no home medications.  Past Medical History  None  Significant Hospital Events   02/06/2019 admitted  Consults:  None  Procedures:  Intubated 1/24 Bilateral chest tube placement 1/25  Significant Diagnostic Tests:  Chest x-ray with an ARDS pattern  Micro Data:  Covid positive Pct Neg  Antimicrobials:  Remdesivir 02/06/19>> Dexamethasone 02/06/19>>   Interim history/subjective:  Supine this AM, still with high vent settings.  Objective   Blood pressure 101/74, pulse 66, temperature 97.7 F (36.5 C), temperature source Oral, resp.  rate (!) 34, height 5\' 6"  (1.676 m), weight 87.5 kg, SpO2 96 %.    Vent Mode: PRVC FiO2 (%):  [60 %-100 %] 60 % Set Rate:  [24 bmp-34 bmp] 34 bmp Vt Set:  [400 mL] 400 mL PEEP:  [10 cmH20-16 cmH20] 10 cmH20 Plateau Pressure:  [18 cmH20-28 cmH20] 23 cmH20   Intake/Output Summary (Last 24 hours) at 02/09/2019 02/11/2019 Last data filed at 02/09/2019 0600 Gross per 24 hour  Intake 3057.04 ml  Output 1765 ml  Net 1292.04 ml   Filed Weights   02/06/19 1251 02/09/19 0416  Weight: 80.3 kg 87.5 kg   Examination: GEN: ill appearing man on vent HEENT: ETT in place CV: RRR, +subcutaneous crepitus PULM: Diminished bilaterally, no accessory muscle use GI: Soft, +BS EXT: No edema NEURO: paralyzed PSYCH: paralyzed SKIN: has necrotic changes of both feet distally  Hemoglobin stable, platelets stable CXR 1/25 subcutaneous emphysema, minimal R PTX, bilateral chest tubes, ETT, OGT in appropriate positions  Creatinine improved ABG looks okay  Sodium remains slightly elevated Albumin low at 1.7 CRP improved to 12 from 19 1500 cc urine output, net +1.3 L yesterday, net +4 L since admission   Resolved Hospital Problem list   N/A  Assessment & Plan:  Acute hypoxemic respiratory failure due to COVID-19 pneumonia, ARDS -Mechanical ventilation, currently on pressure control ventilation.  Goal changed to via ARDS protocol on 1/25, target PRVC 6 cc/kg -Wean PEEP and FiO2 as able -Goal plateau pressure  less than 30, driving pressure less than 15 -Sedation as needed to maximize ventilator synchrony -Paralytics if necessary for vent synchrony, gas exchange.   -VAP prevention order set -Diuresis as tolerated by renal function -16/8-hour proning while PaO2 to FiO2 ratio less than 150  Today: CXR showed L tension PTX resolved with flushing L tube and placing to    COVID-19 pneumonia/ARDS Continue remdesivir, plan 5 days Continue dexamethasone plan 10 days Full anticoagulation for elevated D dimer  on admission  Shock, related to sedation and inflammatory state Wean norepinephrine as able.  Goal MAP 65  Steroid-induced hyperglycemia Hypoglycemic episode 1/25, now hyperglycemic as tube feeds been resumed, increase Levemir to 15 units twice daily and continue sliding scale insulin, adjust as needed to target glucose 100-180  At risk malnutrition Tube feeds  Oliguria- albumin/lasix challenge, avoid nephrotoxins, continue foley  Best practice:  Diet: NPO, TF Pain/Anxiety/Delirium protocol (if indicated): See above VAP protocol (if indicated): Ordered DVT prophylaxis: Full dose Lovenox GI prophylaxis: Pepcid Glucose control: SSI and Levemir Mobility: Bedrest Code Status: Full Family Communication: We will call today Disposition: ICU    Labs   CBC: Recent Labs  Lab 02/06/19 0654 02/06/19 0654 02/07/19 0401 02/07/19 1453 02/08/19 0225 02/08/19 0422 02/08/19 0855 02/08/19 1444 02/09/19 0359  WBC 15.8*  --  19.8*  --   --   --  15.2*  --  12.1*  NEUTROABS 13.4*  --  16.7*  --   --   --  12.9*  --  10.1*  HGB 13.4   < > 13.3   < > 13.3 13.3 13.9 13.3 12.9*  HCT 43.7   < > 41.6   < > 39.0 39.0 46.2 39.0 42.5  MCV 89.9  --  87.6  --   --   --  93.1  --  91.2  PLT 306  --  290  --   --   --  331  --  327   < > = values in this interval not displayed.    Basic Metabolic Panel: Recent Labs  Lab 02/06/19 0654 02/06/19 0654 02/07/19 0401 02/07/19 1453 02/08/19 0225 02/08/19 0422 02/08/19 0855 02/08/19 1304 02/08/19 1444 02/09/19 0359  NA 135   < > 143   < > 144 143 146*  --  144 145  K 4.8   < > 3.8   < > 4.3 4.4 4.6  --  4.4 4.0  CL 99  --  108  --   --   --  109  --   --  112*  CO2 16*  --  22  --   --   --  26  --   --  24  GLUCOSE 473*  --  93  --   --   --  81  --   --  294*  BUN 20  --  16  --   --   --  34*  --   --  25*  CREATININE 1.18  --  0.66  --   --   --  1.23  --   --  0.87  CALCIUM 7.6*  --  8.1*  --   --   --  7.6*  --   --  7.4*  MG  --    --   --   --   --   --  2.7* 2.7*  --  2.3  PHOS  --   --   --   --   --   --  7.0* 5.8*  --  3.2   < > = values in this interval not displayed.   GFR: Estimated Creatinine Clearance: 103 mL/min (by C-G formula based on SCr of 0.87 mg/dL). Recent Labs  Lab 02/06/19 0654 02/06/19 0717 02/06/19 1008 02/07/19 0401 02/08/19 0855 02/09/19 0359  PROCALCITON 0.17  --   --   --   --   --   WBC 15.8*  --   --  19.8* 15.2* 12.1*  LATICACIDVEN  --  6.0* 3.6*  --   --   --     Liver Function Tests: Recent Labs  Lab 02/06/19 0654 02/07/19 0401 02/08/19 0855 02/09/19 0359  AST 78* 120* 53* 22  ALT 62* 76* 65* 43  ALKPHOS 103 105 105 75  BILITOT 1.1 0.8 0.4 0.8  PROT 6.3* 6.2* 6.7 5.3*  ALBUMIN 2.2* 2.3* 2.3* 1.7*   No results for input(s): LIPASE, AMYLASE in the last 168 hours. No results for input(s): AMMONIA in the last 168 hours.  ABG    Component Value Date/Time   PHART 7.335 (L) 02/08/2019 1444   PCO2ART 44.0 02/08/2019 1444   PO2ART 113.0 (H) 02/08/2019 1444   HCO3 23.3 02/08/2019 1444   TCO2 25 02/08/2019 1444   ACIDBASEDEF 2.0 02/08/2019 1444   O2SAT 98.0 02/08/2019 1444     Coagulation Profile: No results for input(s): INR, PROTIME in the last 168 hours.  Cardiac Enzymes: No results for input(s): CKTOTAL, CKMB, CKMBINDEX, TROPONINI in the last 168 hours.  HbA1C: Hgb A1c MFr Bld  Date/Time Value Ref Range Status  02/06/2019 06:54 AM 12.6 (H) 4.8 - 5.6 % Final    Comment:    (NOTE) Pre diabetes:          5.7%-6.4% Diabetes:              >6.4% Glycemic control for   <7.0% adults with diabetes     CBG: Recent Labs  Lab 02/08/19 1553 02/08/19 1936 02/08/19 2303 02/09/19 0342 02/09/19 0709  GLUCAP 153* 167* 239* 293* 277*     The patient is critically ill with multiple organ systems failure and requires high complexity decision making for assessment and support, frequent evaluation and titration of therapies, application of advanced monitoring  technologies and extensive interpretation of multiple databases. Critical Care Time devoted to patient care services described in this note independent of APP/resident time (if applicable)  is 34 minutes.   Erskine Emery MD [facility] 02/09/2019 7:20 AM Personal pager: [phone] If unanswered, please page CCM On-call: [phone]

## 2019-02-09 NOTE — Progress Notes (Signed)
Called and updated daughter.  

## 2019-02-09 NOTE — Progress Notes (Signed)
Pt placed in semi-fowlers position from prone. PEEP and FIO2 increased to maintain SATs >85%. See flowsheets.

## 2019-02-09 NOTE — Progress Notes (Signed)
Nimbex started as per MD order.  Vec stopped.  Patient has voided into foley so far.  Patient is not synchronous with the vent anymore.  Fentanyl is turned up to max, nimbex is going up slowly.  Will see how night goes, might suggest to go back to vec, since kidney function is good

## 2019-02-09 NOTE — Progress Notes (Signed)
Patient's head repositioned and arms rotated.  Patient tolerated well.  No skin breakdown noted at this time.  ETT secure.  

## 2019-02-10 ENCOUNTER — Inpatient Hospital Stay (HOSPITAL_COMMUNITY): Payer: HRSA Program

## 2019-02-10 LAB — CBC WITH DIFFERENTIAL/PLATELET
Abs Immature Granulocytes: 0.05 10*3/uL (ref 0.00–0.07)
Basophils Absolute: 0 10*3/uL (ref 0.0–0.1)
Basophils Relative: 0 %
Eosinophils Absolute: 0 10*3/uL (ref 0.0–0.5)
Eosinophils Relative: 0 %
HCT: 34.9 % — ABNORMAL LOW (ref 39.0–52.0)
Hemoglobin: 10.7 g/dL — ABNORMAL LOW (ref 13.0–17.0)
Immature Granulocytes: 1 %
Lymphocytes Relative: 9 %
Lymphs Abs: 0.7 10*3/uL (ref 0.7–4.0)
MCH: 27.8 pg (ref 26.0–34.0)
MCHC: 30.7 g/dL (ref 30.0–36.0)
MCV: 90.6 fL (ref 80.0–100.0)
Monocytes Absolute: 0.4 10*3/uL (ref 0.1–1.0)
Monocytes Relative: 5 %
Neutro Abs: 6.8 10*3/uL (ref 1.7–7.7)
Neutrophils Relative %: 85 %
Platelets: 274 10*3/uL (ref 150–400)
RBC: 3.85 MIL/uL — ABNORMAL LOW (ref 4.22–5.81)
RDW: 13.3 % (ref 11.5–15.5)
WBC: 7.9 10*3/uL (ref 4.0–10.5)
nRBC: 0.3 % — ABNORMAL HIGH (ref 0.0–0.2)

## 2019-02-10 LAB — POCT I-STAT 7, (LYTES, BLD GAS, ICA,H+H)
Acid-Base Excess: 5 mmol/L — ABNORMAL HIGH (ref 0.0–2.0)
Bicarbonate: 29.1 mmol/L — ABNORMAL HIGH (ref 20.0–28.0)
Calcium, Ion: 1.13 mmol/L — ABNORMAL LOW (ref 1.15–1.40)
HCT: 30 % — ABNORMAL LOW (ref 39.0–52.0)
Hemoglobin: 10.2 g/dL — ABNORMAL LOW (ref 13.0–17.0)
O2 Saturation: 88 %
Patient temperature: 97.7
Potassium: 4.1 mmol/L (ref 3.5–5.1)
Sodium: 150 mmol/L — ABNORMAL HIGH (ref 135–145)
TCO2: 30 mmol/L (ref 22–32)
pCO2 arterial: 38.4 mmHg (ref 32.0–48.0)
pH, Arterial: 7.486 — ABNORMAL HIGH (ref 7.350–7.450)
pO2, Arterial: 50 mmHg — ABNORMAL LOW (ref 83.0–108.0)

## 2019-02-10 LAB — COMPREHENSIVE METABOLIC PANEL
ALT: 26 U/L (ref 0–44)
AST: 15 U/L (ref 15–41)
Albumin: 2.6 g/dL — ABNORMAL LOW (ref 3.5–5.0)
Alkaline Phosphatase: 58 U/L (ref 38–126)
Anion gap: 7 (ref 5–15)
BUN: 25 mg/dL — ABNORMAL HIGH (ref 6–20)
CO2: 30 mmol/L (ref 22–32)
Calcium: 7.4 mg/dL — ABNORMAL LOW (ref 8.9–10.3)
Chloride: 111 mmol/L (ref 98–111)
Creatinine, Ser: 0.77 mg/dL (ref 0.61–1.24)
GFR calc Af Amer: >60 mL/min (ref >60)
GFR calc non Af Amer: >60 mL/min (ref >60)
Glucose, Bld: 214 mg/dL — ABNORMAL HIGH (ref 70–99)
Potassium: 3.4 mmol/L — ABNORMAL LOW (ref 3.5–5.1)
Sodium: 148 mmol/L — ABNORMAL HIGH (ref 135–145)
Total Bilirubin: 0.3 mg/dL (ref 0.3–1.2)
Total Protein: 5.2 g/dL — ABNORMAL LOW (ref 6.5–8.1)

## 2019-02-10 LAB — D-DIMER, QUANTITATIVE: D-Dimer, Quant: 1.65 ug/mL-FEU — ABNORMAL HIGH (ref 0.00–0.50)

## 2019-02-10 LAB — GLUCOSE, CAPILLARY
Glucose-Capillary: 119 mg/dL — ABNORMAL HIGH (ref 70–99)
Glucose-Capillary: 149 mg/dL — ABNORMAL HIGH (ref 70–99)
Glucose-Capillary: 156 mg/dL — ABNORMAL HIGH (ref 70–99)
Glucose-Capillary: 205 mg/dL — ABNORMAL HIGH (ref 70–99)
Glucose-Capillary: 206 mg/dL — ABNORMAL HIGH (ref 70–99)
Glucose-Capillary: 207 mg/dL — ABNORMAL HIGH (ref 70–99)
Glucose-Capillary: 242 mg/dL — ABNORMAL HIGH (ref 70–99)

## 2019-02-10 LAB — C-REACTIVE PROTEIN: CRP: 3.5 mg/dL — ABNORMAL HIGH (ref <1.0)

## 2019-02-10 MED ORDER — MAGNESIUM SULFATE 2 GM/50ML IV SOLN
2.0000 g | Freq: Once | INTRAVENOUS | Status: AC
  Administered 2019-02-10: 2 g via INTRAVENOUS
  Filled 2019-02-10: qty 50

## 2019-02-10 MED ORDER — INSULIN DETEMIR 100 UNIT/ML ~~LOC~~ SOLN
20.0000 [IU] | Freq: Two times a day (BID) | SUBCUTANEOUS | Status: DC
  Administered 2019-02-10: 10:00:00 20 [IU] via SUBCUTANEOUS
  Filled 2019-02-10 (×4): qty 0.2

## 2019-02-10 MED ORDER — POTASSIUM CHLORIDE 20 MEQ/15ML (10%) PO SOLN
40.0000 meq | Freq: Two times a day (BID) | ORAL | Status: AC
  Administered 2019-02-10 (×2): 40 meq via ORAL
  Filled 2019-02-10 (×2): qty 30

## 2019-02-10 MED ORDER — DEXTROSE 5 % IV SOLN
500.0000 mg | Freq: Once | INTRAVENOUS | Status: AC
  Administered 2019-02-10: 500 mg via INTRAVENOUS
  Filled 2019-02-10: qty 500

## 2019-02-10 MED ORDER — ENOXAPARIN SODIUM 40 MG/0.4ML ~~LOC~~ SOLN
0.5000 mg/kg | Freq: Two times a day (BID) | SUBCUTANEOUS | Status: DC
  Administered 2019-02-10 – 2019-02-17 (×15): 40 mg via SUBCUTANEOUS
  Filled 2019-02-10 (×15): qty 0.4

## 2019-02-10 MED ORDER — FREE WATER
300.0000 mL | Freq: Four times a day (QID) | Status: DC
  Administered 2019-02-10 – 2019-02-11 (×4): 300 mL

## 2019-02-10 NOTE — Progress Notes (Signed)
Patient flipped from prone position to semi-fowlers position. ET tube retaped at this time. SpO2 remained stable throughout.  Mat Carne RRT

## 2019-02-10 NOTE — Progress Notes (Signed)
Called and updated daughter.  Dan Tomoko Sandra MD 

## 2019-02-10 NOTE — Progress Notes (Signed)
Patient's head repositioned and arms rotated.  Patient tolerated well.  No skin breakdown noted at this time. ETT remains secure.  

## 2019-02-10 NOTE — Progress Notes (Signed)
Dr. Katrinka Blazing made aware of 1130 ABG results. Verbal orders to continue patient supine and to not prone tonight, he will reassess 02/11/19. Will continue to closely monitor.

## 2019-02-10 NOTE — Progress Notes (Signed)
  Patient's head repositioned and arms rotated.  Patient tolerated well.  No skin breakdown noted at this time. ETT secure.

## 2019-02-10 NOTE — Progress Notes (Signed)
NAME:  Andrew Bruce, MRN:  [id number], DOB:  1965-11-15, LOS: 4 ADMISSION DATE:  02/06/2019, CONSULTATION DATE:  02/06/19 REFERRING MD:  ER, CHIEF COMPLAINT:  SOB   Brief History   This is a 54 year old man without a past medical history who presents with worsening shortness of breath over the past 2 days in the context of a 10-day Covid illness.  Chest x-ray and vitals are consistent with a worsening Covid ARDS.  History of present illness   This is a 54 year old man without a past medical history who presents with worsening shortness of breath over the past 2 days in the context of a 10-day Covid illness.  Chest x-ray and vitals are consistent with a worsening Covid ARDS.  History performed with Spanish interpreter as patient is Spanish-speaking only.  He states he was feeling fatigued but stable since an ER visit last week for Covid.  Over the past 2 days he has noticed a dramatic increase in his shortness of breath.  He is dyspneic even speaking.  He was placed on a nonrebreather and nasal cannula in the ER and is still mildly tachypneic with sats in the 80s.  He denies chest pain, productive cough.  He does have a dry cough with inspiration that is paroxysmal.  He denies any leg swelling.  He has lost his sense of taste and smell.  PCCM consulted for admission.  Patient has never been hospitalized for.  He has no chronic medical issues and is on no home medications.  Past Medical History  None  Significant Hospital Events   02/06/2019 admitted  Consults:  None  Procedures:  Intubated 1/24 Bilateral chest tube placement 1/25  Significant Diagnostic Tests:  Chest x-ray with an ARDS pattern  Micro Data:  Covid positive Pct Neg  Antimicrobials:  Remdesivir 02/06/19>>1/28 Dexamethasone 02/06/19>>2/2   Interim history/subjective:  No events, proned.  Oxygenation improving!  Objective   Blood pressure 130/80, pulse 80, temperature 97.6 F (36.4 C), temperature source Oral, resp.  rate (!) 34, height 5\' 6"  (1.676 m), weight 88 kg, SpO2 97 %.    Vent Mode: PRVC FiO2 (%):  [60 %-80 %] 60 % Set Rate:  [34 bmp] 34 bmp Vt Set:  [380 mL-400 mL] 380 mL PEEP:  [10 cmH20-16 cmH20] 10 cmH20 Plateau Pressure:  [22 cmH20-25 cmH20] 25 cmH20   Intake/Output Summary (Last 24 hours) at 02/10/2019 0703 Last data filed at 02/10/2019 0530 Gross per 24 hour  Intake 2853.21 ml  Output 3548 ml  Net -694.79 ml   Filed Weights   02/06/19 1251 02/09/19 0416 02/10/19 0418  Weight: 80.3 kg 87.5 kg 88 kg   Examination: GEN: ill appearing man on vent HEENT: ETT in place CV: Proned, ext warm PULM: Suprisingly clear, passive on vent GI: Proned EXT: No edema NEURO: paralyzed PSYCH: paralyzed SKIN: has necrotic changes of both feet distally  Hemoglobin stable, platelets stable CXR 1/26 subcutaneous emphysema, bilateral chest tubes, ETT, OGT in appropriate positions  Labs all reviewed, nothing standing out except sodium which went to 148 from [facility] Problem list   N/A  Assessment & Plan:  Acute hypoxemic respiratory failure due to COVID-19 pneumonia, ARDS -Mechanical ventilation, currently on pressure control ventilation.  Goal changed to via ARDS protocol on 1/25, target PRVC 6 cc/kg -Wean PEEP and FiO2 as able -Goal plateau pressure less than 30, driving pressure less than 15 -Sedation as needed to maximize ventilator synchrony -Paralytics if necessary for vent synchrony, gas  exchange.   -VAP prevention order set -Diuresis as tolerated by renal function -16/8-hour proning while PaO2 to FiO2 ratio less than 150  Today: continue chest tubes to suction, diuril x 1, ABG/CXR when unproned, may not need proning this evening    COVID-19 pneumonia/ARDS Continue remdesivir, plan 5 days Continue dexamethasone plan 10 days Switch to lovenox 0.5mg /kg BID, recent trial raises question of harm in full empiric AC in ICU  Shock, related to sedation and inflammatory  state Wean norepinephrine as able.  Goal MAP 65  Steroid-induced hyperglycemia Hypoglycemic episode 1/25, now hyperglycemic as tube feeds been resumed, increase Levemir to 20 units twice daily and continue sliding scale insulin, adjust as needed to target glucose 100-180  At risk malnutrition Tube feeds  Oliguria, hypernatremia- improved with lasix/albumin challenge, continue foley, diuril x 1, increase free water flushes, goal net even     Best practice:  Diet: NPO, TF Pain/Anxiety/Delirium protocol (if indicated): See above VAP protocol (if indicated): Ordered DVT prophylaxis: 0.5mg /kg BID Lovenox GI prophylaxis: Pepcid Glucose control: SSI and Levemir Mobility: Bedrest Code Status: Full Family Communication: We will call today Disposition: ICU    Labs   CBC: Recent Labs  Lab 02/06/19 0654 02/06/19 0654 02/07/19 0401 02/07/19 1453 02/08/19 0855 02/08/19 1444 02/09/19 0359 02/09/19 1223 02/10/19 0401  WBC 15.8*  --  19.8*  --  15.2*  --  12.1*  --  7.9  NEUTROABS 13.4*  --  16.7*  --  12.9*  --  10.1*  --  6.8  HGB 13.4   < > 13.3   < > 13.9 13.3 12.9* 13.6 10.7*  HCT 43.7   < > 41.6   < > 46.2 39.0 42.5 40.0 34.9*  MCV 89.9  --  87.6  --  93.1  --  91.2  --  90.6  PLT 306  --  290  --  331  --  327  --  274   < > = values in this interval not displayed.    Basic Metabolic Panel: Recent Labs  Lab 02/06/19 0654 02/06/19 0654 02/07/19 0401 02/07/19 1453 02/08/19 0855 02/08/19 1304 02/08/19 1444 02/09/19 0359 02/09/19 1223 02/09/19 1600 02/10/19 0401  NA 135   < > 143   < > 146*  --  144 145 146*  --  148*  K 4.8   < > 3.8   < > 4.6  --  4.4 4.0 3.7  --  3.4*  CL 99  --  108  --  109  --   --  112*  --   --  111  CO2 16*  --  22  --  26  --   --  24  --   --  30  GLUCOSE 473*  --  93  --  81  --   --  294*  --   --  214*  BUN 20  --  16  --  34*  --   --  25*  --   --  25*  CREATININE 1.18  --  0.66  --  1.23  --   --  0.87  --   --  0.77  CALCIUM  7.6*  --  8.1*  --  7.6*  --   --  7.4*  --   --  7.4*  MG  --   --   --   --  2.7* 2.7*  --  2.3  --  2.5*  --  PHOS  --   --   --   --  7.0* 5.8*  --  3.2  --  2.5  --    < > = values in this interval not displayed.   GFR: Estimated Creatinine Clearance: 112.3 mL/min (by C-G formula based on SCr of 0.77 mg/dL). Recent Labs  Lab 02/06/19 0654 02/06/19 0654 02/06/19 0717 02/06/19 1008 02/07/19 0401 02/08/19 0855 02/09/19 0359 02/10/19 0401  PROCALCITON 0.17  --   --   --   --   --   --   --   WBC 15.8*   < >  --   --  19.8* 15.2* 12.1* 7.9  LATICACIDVEN  --   --  6.0* 3.6*  --   --   --   --    < > = values in this interval not displayed.    Liver Function Tests: Recent Labs  Lab 02/06/19 0654 02/07/19 0401 02/08/19 0855 02/09/19 0359 02/10/19 0401  AST 78* 120* 53* 22 15  ALT 62* 76* 65* 43 26  ALKPHOS 103 105 105 75 58  BILITOT 1.1 0.8 0.4 0.8 0.3  PROT 6.3* 6.2* 6.7 5.3* 5.2*  ALBUMIN 2.2* 2.3* 2.3* 1.7* 2.6*   No results for input(s): LIPASE, AMYLASE in the last 168 hours. No results for input(s): AMMONIA in the last 168 hours.  ABG    Component Value Date/Time   PHART 7.415 02/09/2019 1223   PCO2ART 41.5 02/09/2019 1223   PO2ART 104.0 02/09/2019 1223   HCO3 26.7 02/09/2019 1223   TCO2 28 02/09/2019 1223   ACIDBASEDEF 2.0 02/08/2019 1444   O2SAT 98.0 02/09/2019 1223     Coagulation Profile: No results for input(s): INR, PROTIME in the last 168 hours.  Cardiac Enzymes: No results for input(s): CKTOTAL, CKMB, CKMBINDEX, TROPONINI in the last 168 hours.  HbA1C: Hgb A1c MFr Bld  Date/Time Value Ref Range Status  02/06/2019 06:54 AM 12.6 (H) 4.8 - 5.6 % Final    Comment:    (NOTE) Pre diabetes:          5.7%-6.4% Diabetes:              >6.4% Glycemic control for   <7.0% adults with diabetes     CBG: Recent Labs  Lab 02/09/19 1112 02/09/19 1504 02/09/19 1917 02/10/19 0019 02/10/19 0344  GLUCAP 256* 241* 251* 242* 205*     The patient  is critically ill with multiple organ systems failure and requires high complexity decision making for assessment and support, frequent evaluation and titration of therapies, application of advanced monitoring technologies and extensive interpretation of multiple databases. Critical Care Time devoted to patient care services described in this note independent of APP/resident time (if applicable)  is 33 minutes.   Myrla Halsted MD [facility] 02/10/2019 7:03 AM Personal pager: [phone] If unanswered, please page CCM On-call: #[phone]

## 2019-02-11 ENCOUNTER — Inpatient Hospital Stay (HOSPITAL_COMMUNITY): Payer: HRSA Program

## 2019-02-11 LAB — CBC WITH DIFFERENTIAL/PLATELET
Abs Immature Granulocytes: 0.02 10*3/uL (ref 0.00–0.07)
Basophils Absolute: 0 10*3/uL (ref 0.0–0.1)
Basophils Relative: 0 %
Eosinophils Absolute: 0 10*3/uL (ref 0.0–0.5)
Eosinophils Relative: 0 %
HCT: 32.9 % — ABNORMAL LOW (ref 39.0–52.0)
Hemoglobin: 10 g/dL — ABNORMAL LOW (ref 13.0–17.0)
Immature Granulocytes: 0 %
Lymphocytes Relative: 14 %
Lymphs Abs: 0.8 10*3/uL (ref 0.7–4.0)
MCH: 27.6 pg (ref 26.0–34.0)
MCHC: 30.4 g/dL (ref 30.0–36.0)
MCV: 90.9 fL (ref 80.0–100.0)
Monocytes Absolute: 0.4 10*3/uL (ref 0.1–1.0)
Monocytes Relative: 6 %
Neutro Abs: 4.8 10*3/uL (ref 1.7–7.7)
Neutrophils Relative %: 80 %
Platelets: 278 10*3/uL (ref 150–400)
RBC: 3.62 MIL/uL — ABNORMAL LOW (ref 4.22–5.81)
RDW: 13.4 % (ref 11.5–15.5)
WBC: 6.1 10*3/uL (ref 4.0–10.5)
nRBC: 0.3 % — ABNORMAL HIGH (ref 0.0–0.2)

## 2019-02-11 LAB — COMPREHENSIVE METABOLIC PANEL
ALT: 25 U/L (ref 0–44)
AST: 20 U/L (ref 15–41)
Albumin: 2.6 g/dL — ABNORMAL LOW (ref 3.5–5.0)
Alkaline Phosphatase: 53 U/L (ref 38–126)
Anion gap: 12 (ref 5–15)
BUN: 26 mg/dL — ABNORMAL HIGH (ref 6–20)
CO2: 29 mmol/L (ref 22–32)
Calcium: 8.2 mg/dL — ABNORMAL LOW (ref 8.9–10.3)
Chloride: 107 mmol/L (ref 98–111)
Creatinine, Ser: 0.67 mg/dL (ref 0.61–1.24)
GFR calc Af Amer: >60 mL/min (ref >60)
GFR calc non Af Amer: >60 mL/min (ref >60)
Glucose, Bld: 213 mg/dL — ABNORMAL HIGH (ref 70–99)
Potassium: 4.4 mmol/L (ref 3.5–5.1)
Sodium: 148 mmol/L — ABNORMAL HIGH (ref 135–145)
Total Bilirubin: 0.8 mg/dL (ref 0.3–1.2)
Total Protein: 5.2 g/dL — ABNORMAL LOW (ref 6.5–8.1)

## 2019-02-11 LAB — CULTURE, BLOOD (ROUTINE X 2)
Culture: NO GROWTH
Culture: NO GROWTH
Special Requests: ADEQUATE

## 2019-02-11 LAB — D-DIMER, QUANTITATIVE: D-Dimer, Quant: 1.2 ug/mL-FEU — ABNORMAL HIGH (ref 0.00–0.50)

## 2019-02-11 LAB — C-REACTIVE PROTEIN: CRP: 2.1 mg/dL — ABNORMAL HIGH (ref <1.0)

## 2019-02-11 LAB — GLUCOSE, CAPILLARY
Glucose-Capillary: 189 mg/dL — ABNORMAL HIGH (ref 70–99)
Glucose-Capillary: 250 mg/dL — ABNORMAL HIGH (ref 70–99)
Glucose-Capillary: 301 mg/dL — ABNORMAL HIGH (ref 70–99)
Glucose-Capillary: 323 mg/dL — ABNORMAL HIGH (ref 70–99)
Glucose-Capillary: 281 mg/dL — ABNORMAL HIGH (ref 70–99)
Glucose-Capillary: 195 mg/dL — ABNORMAL HIGH (ref 70–99)

## 2019-02-11 LAB — MAGNESIUM: Magnesium: 2.7 mg/dL — ABNORMAL HIGH (ref 1.7–2.4)

## 2019-02-11 MED ORDER — FREE WATER
500.0000 mL | Freq: Four times a day (QID) | Status: DC
  Administered 2019-02-11 – 2019-02-13 (×8): 500 mL

## 2019-02-11 MED ORDER — INSULIN DETEMIR 100 UNIT/ML ~~LOC~~ SOLN
25.0000 [IU] | Freq: Two times a day (BID) | SUBCUTANEOUS | Status: DC
  Administered 2019-02-11 – 2019-02-15 (×10): 25 [IU] via SUBCUTANEOUS
  Filled 2019-02-11 (×13): qty 0.25

## 2019-02-11 MED ORDER — "THROMBI-PAD 3""X3"" EX PADS"
1.0000 | MEDICATED_PAD | Freq: Once | CUTANEOUS | Status: AC
  Administered 2019-02-11: 13:00:00 1 via TOPICAL
  Filled 2019-02-11: qty 1

## 2019-02-11 MED ORDER — INSULIN ASPART 100 UNIT/ML ~~LOC~~ SOLN
2.0000 [IU] | SUBCUTANEOUS | Status: DC
  Administered 2019-02-11 – 2019-02-13 (×6): 2 [IU] via SUBCUTANEOUS

## 2019-02-11 MED FILL — Sodium Chloride IV Soln 0.9%: INTRAVENOUS | Qty: 250 | Status: AC

## 2019-02-11 MED FILL — Fentanyl Citrate Preservative Free (PF) Inj 2500 MCG/50ML: INTRAMUSCULAR | Qty: 50 | Status: AC

## 2019-02-11 NOTE — Progress Notes (Signed)
NAME:  Andrew Bruce, MRN:  [id number], DOB:  1965/02/09, LOS: 5 ADMISSION DATE:  02/06/2019, CONSULTATION DATE:  02/06/19 REFERRING MD:  ER, CHIEF COMPLAINT:  SOB   Brief History   This is a 54 year old man without a past medical history who presents with worsening shortness of breath over the past 2 days in the context of a 10-day Covid illness.  Chest x-ray and vitals are consistent with a worsening Covid ARDS.  History of present illness   This is a 54 year old man without a past medical history who presents with worsening shortness of breath over the past 2 days in the context of a 10-day Covid illness.  Chest x-ray and vitals are consistent with a worsening Covid ARDS.  History performed with Spanish interpreter as patient is Spanish-speaking only.  He states he was feeling fatigued but stable since an ER visit last week for Covid.  Over the past 2 days he has noticed a dramatic increase in his shortness of breath.  He is dyspneic even speaking.  He was placed on a nonrebreather and nasal cannula in the ER and is still mildly tachypneic with sats in the 80s.  He denies chest pain, productive cough.  He does have a dry cough with inspiration that is paroxysmal.  He denies any leg swelling.  He has lost his sense of taste and smell.  PCCM consulted for admission.  Patient has never been hospitalized for.  He has no chronic medical issues and is on no home medications.  Past Medical History  None  Significant Hospital Events   02/06/2019 admitted  Consults:  None  Procedures:  Intubated 1/24 Bilateral chest tube placement 1/25  Significant Diagnostic Tests:  Chest x-ray with an ARDS pattern  Micro Data:  Covid positive Pct Neg  Antimicrobials:  Remdesivir 02/06/19>>1/28 Dexamethasone 02/06/19>>2/2   Interim history/subjective:  No events, held off proning last night.  Objective   Blood pressure (!) 87/59, pulse 62, temperature 97.7 F (36.5 C), temperature source Oral, resp.  rate (!) 34, height 5\' 6"  (1.676 m), weight 87.5 kg, SpO2 91 %.    Vent Mode: PRVC FiO2 (%):  [50 %-60 %] 50 % Set Rate:  [34 bmp] 34 bmp Vt Set:  [380 mL] 380 mL PEEP:  [8 cmH20-10 cmH20] 10 cmH20 Plateau Pressure:  [21 cmH20-23 cmH20] 23 cmH20   Intake/Output Summary (Last 24 hours) at 02/11/2019 7564 Last data filed at 02/11/2019 0600 Gross per 24 hour  Intake 3560.85 ml  Output 1715 ml  Net 1845.85 ml   Filed Weights   02/09/19 0416 02/10/19 0418 02/11/19 0500  Weight: 87.5 kg 88 kg 87.5 kg   Examination: GEN: ill appearing man on vent HEENT: ETT in place CV: Proned, ext warm PULM: Suprisingly clear, passive on vent GI: Soft, hypoactive BS EXT: No edema NEURO: paralyzed PSYCH: paralyzed SKIN: has necrotic changes of both feet distally  Sodium worse CBC stable CRP looks better CBGs a little on higher side but overall improved  Resolved Hospital Problem list   N/A  Assessment & Plan:  Acute hypoxemic respiratory failure due to COVID-19 pneumonia, ARDS -Mechanical ventilation, currently on pressure control ventilation.  Goal changed to via ARDS protocol on 1/25, target PRVC 6 cc/kg -Wean PEEP and FiO2 as able -Goal plateau pressure less than 30, driving pressure less than 15 -Sedation as needed to maximize ventilator synchrony -Paralytics if necessary for vent synchrony, gas exchange.   -VAP prevention order set -Diuresis as tolerated by renal function -  16/8-hour proning while PaO2 to FiO2 ratio less than 150  Today: continue chest tubes to suction, increase FWF, hold on further diuril, trial off paralytics   COVID-19 pneumonia/ARDS Continue remdesivir, plan 5 days Continue dexamethasone plan 10 days Lovenox 0.5mg /kg BID   Steroid-induced hyperglycemia Levemir 25 units BID and SSI   At risk malnutrition Tube feeds  Oliguria, hypernatremia- increase FWF, monitor UoP, diuril if get behind  Best practice:  Diet: NPO, TF Pain/Anxiety/Delirium protocol  (if indicated): See above VAP protocol (if indicated): Ordered DVT prophylaxis: 0.5mg /kg BID Lovenox GI prophylaxis: Pepcid Glucose control: SSI and Levemir Mobility: Bedrest Code Status: Full Family Communication: We will call today Disposition: ICU  Labs   CBC: Recent Labs  Lab 02/07/19 0401 02/07/19 1453 02/08/19 0855 02/08/19 1444 02/09/19 0359 02/09/19 1223 02/10/19 0401 02/10/19 1130 02/11/19 0451  WBC 19.8*  --  15.2*  --  12.1*  --  7.9  --  6.1  NEUTROABS 16.7*  --  12.9*  --  10.1*  --  6.8  --  4.8  HGB 13.3   < > 13.9   < > 12.9* 13.6 10.7* 10.2* 10.0*  HCT 41.6   < > 46.2   < > 42.5 40.0 34.9* 30.0* 32.9*  MCV 87.6  --  93.1  --  91.2  --  90.6  --  90.9  PLT 290  --  331  --  327  --  274  --  278   < > = values in this interval not displayed.    Basic Metabolic Panel: Recent Labs  Lab 02/07/19 0401 02/07/19 1453 02/08/19 0855 02/08/19 1304 02/08/19 1444 02/09/19 0359 02/09/19 1223 02/09/19 1600 02/10/19 0401 02/10/19 1130 02/11/19 0451  NA 143   < > 146*  --    < > 145 146*  --  148* 150* 148*  K 3.8   < > 4.6  --    < > 4.0 3.7  --  3.4* 4.1 4.4  CL 108  --  109  --   --  112*  --   --  111  --  107  CO2 22  --  26  --   --  24  --   --  30  --  29  GLUCOSE 93  --  81  --   --  294*  --   --  214*  --  213*  BUN 16  --  34*  --   --  25*  --   --  25*  --  26*  CREATININE 0.66  --  1.23  --   --  0.87  --   --  0.77  --  0.67  CALCIUM 8.1*  --  7.6*  --   --  7.4*  --   --  7.4*  --  8.2*  MG  --   --  2.7* 2.7*  --  2.3  --  2.5*  --   --  2.7*  PHOS  --   --  7.0* 5.8*  --  3.2  --  2.5  --   --   --    < > = values in this interval not displayed.   GFR: Estimated Creatinine Clearance: 112 mL/min (by C-G formula based on SCr of 0.67 mg/dL). Recent Labs  Lab 02/06/19 0654 02/06/19 0717 02/06/19 1008 02/07/19 0401 02/08/19 0855 02/09/19 0359 02/10/19 0401 02/11/19 0451  PROCALCITON 0.17  --   --   --   --   --   --   --  WBC  15.8*  --   --    < > 15.2* 12.1* 7.9 6.1  LATICACIDVEN  --  6.0* 3.6*  --   --   --   --   --    < > = values in this interval not displayed.    Liver Function Tests: Recent Labs  Lab 02/07/19 0401 02/08/19 0855 02/09/19 0359 02/10/19 0401 02/11/19 0451  AST 120* 53* 22 15 20   ALT 76* 65* 43 26 25  ALKPHOS 105 105 75 58 53  BILITOT 0.8 0.4 0.8 0.3 0.8  PROT 6.2* 6.7 5.3* 5.2* 5.2*  ALBUMIN 2.3* 2.3* 1.7* 2.6* 2.6*   No results for input(s): LIPASE, AMYLASE in the last 168 hours. No results for input(s): AMMONIA in the last 168 hours.  ABG    Component Value Date/Time   PHART 7.486 (H) 02/10/2019 1130   PCO2ART 38.4 02/10/2019 1130   PO2ART 50.0 (L) 02/10/2019 1130   HCO3 29.1 (H) 02/10/2019 1130   TCO2 30 02/10/2019 1130   ACIDBASEDEF 2.0 02/08/2019 1444   O2SAT 88.0 02/10/2019 1130     Coagulation Profile: No results for input(s): INR, PROTIME in the last 168 hours.  Cardiac Enzymes: No results for input(s): CKTOTAL, CKMB, CKMBINDEX, TROPONINI in the last 168 hours.  HbA1C: Hgb A1c MFr Bld  Date/Time Value Ref Range Status  02/06/2019 06:54 AM 12.6 (H) 4.8 - 5.6 % Final    Comment:    (NOTE) Pre diabetes:          5.7%-6.4% Diabetes:              >6.4% Glycemic control for   <7.0% adults with diabetes     CBG: Recent Labs  Lab 02/10/19 1120 02/10/19 1619 02/10/19 1923 02/10/19 2308 02/11/19 0303  GLUCAP 119* 149* 206* 207* 189*     The patient is critically ill with multiple organ systems failure and requires high complexity decision making for assessment and support, frequent evaluation and titration of therapies, application of advanced monitoring technologies and extensive interpretation of multiple databases. Critical Care Time devoted to patient care services described in this note independent of APP/resident time (if applicable)  is 31 minutes.   02/13/19 MD [facility] 02/11/2019 7:12 AM Personal pager: [phone] If  unanswered, please page CCM On-call: [phone]

## 2019-02-11 NOTE — Progress Notes (Signed)
Inpatient Diabetes Program Recommendations  AACE/ADA: New Consensus Statement on Inpatient Glycemic Control   Target Ranges:  Prepandial:   less than 140 mg/dL      Peak postprandial:   less than 180 mg/dL (1-2 hours)      Critically ill patients:  140 - 180 mg/dL   Results for RODRIQUES, BADIE (MRN [id number]) as of 02/11/2019 12:29  Ref. Range 02/10/2019 07:42 02/10/2019 11:20 02/10/2019 16:19 02/10/2019 19:23 02/10/2019 23:08 02/11/2019 03:03 02/11/2019 07:33 02/11/2019 11:43  Glucose-Capillary Latest Ref Range: 70 - 99 mg/dL 747 (H) 159 (H) 539 (H) 206 (H) 207 (H) 189 (H) 250 (H) 301 (H)   Review of Glycemic Control  Diabetes history: DM2 Outpatient Diabetes medications:  Current orders for Inpatient glycemic control: Levemir 25 units BID, Novolog 0-20 units Q4H; Decadron 6 mg Q24H, Vital @ 50 ml/hr  Inpatient Diabetes Program Recommendations:   Insulin-Basal: Noted Levemir was NOT GIVEN last night and Levemir increased today to 25 units BID. Please consider decreasing Levemir to 20 units BID.  Insulin-Tube Feeding Coverage: Please consider ordering Novolog 4 units Q4H for tube feeding coverage. If tube feeding is stopped or held then Novolog tube feeding coverage should also be stopped or held.  Thanks, Orlando Penner, RN, MSN, CDE Diabetes Coordinator Inpatient Diabetes Program [phone] (Team Pager from 8am to 5pm)

## 2019-02-12 ENCOUNTER — Inpatient Hospital Stay (HOSPITAL_COMMUNITY): Payer: HRSA Program

## 2019-02-12 LAB — GLUCOSE, CAPILLARY
Glucose-Capillary: 107 mg/dL — ABNORMAL HIGH (ref 70–99)
Glucose-Capillary: 145 mg/dL — ABNORMAL HIGH (ref 70–99)
Glucose-Capillary: 171 mg/dL — ABNORMAL HIGH (ref 70–99)
Glucose-Capillary: 195 mg/dL — ABNORMAL HIGH (ref 70–99)
Glucose-Capillary: 211 mg/dL — ABNORMAL HIGH (ref 70–99)
Glucose-Capillary: 228 mg/dL — ABNORMAL HIGH (ref 70–99)

## 2019-02-12 LAB — COMPREHENSIVE METABOLIC PANEL
ALT: 28 U/L (ref 0–44)
AST: 21 U/L (ref 15–41)
Albumin: 2.6 g/dL — ABNORMAL LOW (ref 3.5–5.0)
Alkaline Phosphatase: 63 U/L (ref 38–126)
Anion gap: 7 (ref 5–15)
BUN: 28 mg/dL — ABNORMAL HIGH (ref 6–20)
CO2: 30 mmol/L (ref 22–32)
Calcium: 8.1 mg/dL — ABNORMAL LOW (ref 8.9–10.3)
Chloride: 108 mmol/L (ref 98–111)
Creatinine, Ser: 0.61 mg/dL (ref 0.61–1.24)
GFR calc Af Amer: >60 mL/min (ref >60)
GFR calc non Af Amer: >60 mL/min (ref >60)
Glucose, Bld: 110 mg/dL — ABNORMAL HIGH (ref 70–99)
Potassium: 3.9 mmol/L (ref 3.5–5.1)
Sodium: 145 mmol/L (ref 135–145)
Total Bilirubin: 0.4 mg/dL (ref 0.3–1.2)
Total Protein: 5.4 g/dL — ABNORMAL LOW (ref 6.5–8.1)

## 2019-02-12 LAB — CBC WITH DIFFERENTIAL/PLATELET
Abs Immature Granulocytes: 0.05 10*3/uL (ref 0.00–0.07)
Basophils Absolute: 0 10*3/uL (ref 0.0–0.1)
Basophils Relative: 0 %
Eosinophils Absolute: 0 10*3/uL (ref 0.0–0.5)
Eosinophils Relative: 0 %
HCT: 37.2 % — ABNORMAL LOW (ref 39.0–52.0)
Hemoglobin: 11.3 g/dL — ABNORMAL LOW (ref 13.0–17.0)
Immature Granulocytes: 1 %
Lymphocytes Relative: 17 %
Lymphs Abs: 1.4 10*3/uL (ref 0.7–4.0)
MCH: 27.6 pg (ref 26.0–34.0)
MCHC: 30.4 g/dL (ref 30.0–36.0)
MCV: 91 fL (ref 80.0–100.0)
Monocytes Absolute: 0.5 10*3/uL (ref 0.1–1.0)
Monocytes Relative: 7 %
Neutro Abs: 6.2 10*3/uL (ref 1.7–7.7)
Neutrophils Relative %: 75 %
Platelets: 273 10*3/uL (ref 150–400)
RBC: 4.09 MIL/uL — ABNORMAL LOW (ref 4.22–5.81)
RDW: 13.4 % (ref 11.5–15.5)
WBC: 8.1 10*3/uL (ref 4.0–10.5)
nRBC: 0 % (ref 0.0–0.2)

## 2019-02-12 LAB — MAGNESIUM: Magnesium: 2.7 mg/dL — ABNORMAL HIGH (ref 1.7–2.4)

## 2019-02-12 LAB — TRIGLYCERIDES: Triglycerides: 107 mg/dL (ref <150)

## 2019-02-12 MED ORDER — PROPOFOL 1000 MG/100ML IV EMUL
5.0000 ug/kg/min | INTRAVENOUS | Status: DC
  Administered 2019-02-12: 5 ug/kg/min via INTRAVENOUS
  Administered 2019-02-13: 30 ug/kg/min via INTRAVENOUS
  Administered 2019-02-14: 40 ug/kg/min via INTRAVENOUS
  Administered 2019-02-14: 30 ug/kg/min via INTRAVENOUS
  Administered 2019-02-14: 40 ug/kg/min via INTRAVENOUS
  Filled 2019-02-12 (×7): qty 100

## 2019-02-12 MED ORDER — DEXTROSE 5 % IV SOLN: 500.0000 mg | Freq: Once | INTRAVENOUS | Status: DC

## 2019-02-12 MED ORDER — DEXTROSE 5 % IV SOLN
500.0000 mg | Freq: Once | INTRAVENOUS | Status: AC
  Administered 2019-02-12: 11:00:00 500 mg via INTRAVENOUS
  Filled 2019-02-12: qty 500

## 2019-02-12 MED ORDER — CLONAZEPAM 1 MG PO TABS
1.0000 mg | ORAL_TABLET | Freq: Two times a day (BID) | ORAL | Status: DC
  Administered 2019-02-12 – 2019-02-14 (×4): 1 mg
  Filled 2019-02-12 (×4): qty 1

## 2019-02-12 MED ORDER — GABAPENTIN 250 MG/5ML PO SOLN
125.0000 mg | Freq: Three times a day (TID) | ORAL | Status: DC
  Filled 2019-02-12 (×2): qty 4

## 2019-02-12 MED ORDER — DEXMEDETOMIDINE HCL IN NACL 400 MCG/100ML IV SOLN
0.4000 ug/kg/h | INTRAVENOUS | Status: DC
  Administered 2019-02-13: 0.402 ug/kg/h via INTRAVENOUS
  Administered 2019-02-13: 0.4 ug/kg/h via INTRAVENOUS
  Filled 2019-02-12 (×2): qty 100

## 2019-02-12 MED ORDER — QUETIAPINE FUMARATE 25 MG PO TABS
25.0000 mg | ORAL_TABLET | Freq: Two times a day (BID) | ORAL | Status: DC
  Administered 2019-02-12 – 2019-02-20 (×16): 25 mg via ORAL
  Filled 2019-02-12 (×16): qty 1

## 2019-02-12 MED ORDER — OXYCODONE HCL 5 MG PO TABS
5.0000 mg | ORAL_TABLET | ORAL | Status: DC
  Administered 2019-02-12 – 2019-02-26 (×78): 5 mg
  Filled 2019-02-12 (×78): qty 1

## 2019-02-12 MED ORDER — GABAPENTIN 250 MG/5ML PO SOLN
125.0000 mg | Freq: Three times a day (TID) | ORAL | Status: DC
  Administered 2019-02-12 – 2019-02-25 (×38): 125 mg
  Filled 2019-02-12 (×41): qty 4

## 2019-02-12 NOTE — Progress Notes (Signed)
Assisted tele visit to patient with daughter.  Jersi Mcmaster P, RN  

## 2019-02-12 NOTE — Progress Notes (Signed)
Awakens, moves all ext not to command, but dys-synchronous with vent causing desats Continue to wean versed drip Start precedex and propofol, try to find happy medium Start PO klonipin, oxycodone, seroquel, QTc 483 on tele, will have to watch this

## 2019-02-12 NOTE — Progress Notes (Signed)
NAME:  Andrew Bruce, MRN:  [id number], DOB:  12-17-1965, LOS: 6 ADMISSION DATE:  02/06/2019, CONSULTATION DATE:  02/06/19 REFERRING MD:  ER, CHIEF COMPLAINT:  SOB   Brief History   This is a 54 year old man without a past medical history who presents with worsening shortness of breath over the past 2 days in the context of a 10-day Covid illness.  Chest x-ray and vitals are consistent with a worsening Covid ARDS.  History of present illness   This is a 53 year old man without a past medical history who presents with worsening shortness of breath over the past 2 days in the context of a 10-day Covid illness.  Chest x-ray and vitals are consistent with a worsening Covid ARDS.  History performed with Spanish interpreter as patient is Spanish-speaking only.  He states he was feeling fatigued but stable since an ER visit last week for Covid.  Over the past 2 days he has noticed a dramatic increase in his shortness of breath.  He is dyspneic even speaking.  He was placed on a nonrebreather and nasal cannula in the ER and is still mildly tachypneic with sats in the 80s.  He denies chest pain, productive cough.  He does have a dry cough with inspiration that is paroxysmal.  He denies any leg swelling.  He has lost his sense of taste and smell.  PCCM consulted for admission.  Patient has never been hospitalized for.  He has no chronic medical issues and is on no home medications.  Past Medical History  None  Significant Hospital Events   02/06/2019 admitted  Consults:  None  Procedures:  Intubated 1/24 Bilateral chest tube placement 1/25  Significant Diagnostic Tests:  Chest x-ray with an ARDS pattern  Micro Data:  Covid positive Pct Neg  Antimicrobials:  Remdesivir 02/06/19>>1/28 Dexamethasone 02/06/19>>2/2   Interim history/subjective:  Oxygenation improving.  Remains heavily sedated.  Objective   Blood pressure 112/69, pulse 73, temperature 98.3 F (36.8 C), temperature source Oral,  resp. rate (!) 34, height 5\' 6"  (1.676 m), weight 86.5 kg, SpO2 92 %.    Vent Mode: PRVC FiO2 (%):  [50 %] 50 % Set Rate:  [34 bmp] 34 bmp Vt Set:  [380 mL] 380 mL PEEP:  [14 cmH20] 14 cmH20 Plateau Pressure:  [28 cmH20-29 cmH20] 28 cmH20   Intake/Output Summary (Last 24 hours) at 02/12/2019 6269 Last data filed at 02/12/2019 0600 Gross per 24 hour  Intake 3034.11 ml  Output 1880 ml  Net 1154.11 ml   Filed Weights   02/10/19 0418 02/11/19 0500 02/12/19 0500  Weight: 88 kg 87.5 kg 86.5 kg   Examination: GEN: ill appearing man on vent HEENT: ETT in place CV: Proned, ext warm PULM: Suprisingly clear, passive on vent GI: Soft, hypoactive BS EXT: No edema NEURO: opens eyes to voice, withdraws to pain x 4 ext PSYCH: RASS -2 SKIN: has necrotic changes of both feet distally  CBGs better Sodium improved LFTs look good CBC benign 1700 urine output, 1.1 L positive 24h, 6.2 L positive for admission  Resolved Hospital Problem list   N/A  Assessment & Plan:  Acute hypoxemic respiratory failure due to COVID-19 pneumonia, ARDS -Mechanical ventilation, currently on pressure control ventilation.  Goal changed to via ARDS protocol on 1/25, target PRVC 6 cc/kg -Wean PEEP and FiO2 as able -Goal plateau pressure less than 30, driving pressure less than 15 -Sedation as needed to maximize ventilator synchrony -Paralytics if necessary for vent synchrony, gas exchange.   -  VAP prevention order set -Diuresis as tolerated by renal function -16/8-hour proning while PaO2 to FiO2 ratio less than 150  Today: check CXR, wean sedation FiO2/PEEP as able   COVID-19 pneumonia/ARDS Continue remdesivir, plan 5 days Continue dexamethasone plan 10 days Lovenox 0.5mg /kg BID   Steroid-induced hyperglycemia Levemir 25 units BID and SSI   At risk malnutrition Tube feeds  Oliguria, hypernatremia- diuril x 1 today  Best practice:  Diet: NPO, TF Pain/Anxiety/Delirium protocol (if indicated): See  above VAP protocol (if indicated): Ordered DVT prophylaxis: 0.5mg /kg BID Lovenox GI prophylaxis: Pepcid Glucose control: SSI and Levemir Mobility: Bedrest Code Status: Full Family Communication: Updated 1/29, will reach out again today Disposition: ICU  Labs   CBC: Recent Labs  Lab 02/07/19 0401 02/07/19 1453 02/08/19 0855 02/08/19 1444 02/09/19 0359 02/09/19 1223 02/10/19 0401 02/10/19 1130 02/11/19 0451  WBC 19.8*  --  15.2*  --  12.1*  --  7.9  --  6.1  NEUTROABS 16.7*  --  12.9*  --  10.1*  --  6.8  --  4.8  HGB 13.3   < > 13.9   < > 12.9* 13.6 10.7* 10.2* 10.0*  HCT 41.6   < > 46.2   < > 42.5 40.0 34.9* 30.0* 32.9*  MCV 87.6  --  93.1  --  91.2  --  90.6  --  90.9  PLT 290  --  331  --  327  --  274  --  278   < > = values in this interval not displayed.    Basic Metabolic Panel: Recent Labs  Lab 02/07/19 0401 02/07/19 1453 02/08/19 0855 02/08/19 1304 02/08/19 1444 02/09/19 0359 02/09/19 1223 02/09/19 1600 02/10/19 0401 02/10/19 1130 02/11/19 0451  NA 143   < > 146*  --    < > 145 146*  --  148* 150* 148*  K 3.8   < > 4.6  --    < > 4.0 3.7  --  3.4* 4.1 4.4  CL 108  --  109  --   --  112*  --   --  111  --  107  CO2 22  --  26  --   --  24  --   --  30  --  29  GLUCOSE 93  --  81  --   --  294*  --   --  214*  --  213*  BUN 16  --  34*  --   --  25*  --   --  25*  --  26*  CREATININE 0.66  --  1.23  --   --  0.87  --   --  0.77  --  0.67  CALCIUM 8.1*  --  7.6*  --   --  7.4*  --   --  7.4*  --  8.2*  MG  --   --  2.7* 2.7*  --  2.3  --  2.5*  --   --  2.7*  PHOS  --   --  7.0* 5.8*  --  3.2  --  2.5  --   --   --    < > = values in this interval not displayed.   GFR: Estimated Creatinine Clearance: 111.4 mL/min (by C-G formula based on SCr of 0.67 mg/dL). Recent Labs  Lab 02/06/19 0654 02/06/19 0717 02/06/19 1008 02/07/19 0401 02/08/19 0855 02/09/19 0359 02/10/19 0401 02/11/19 0451  PROCALCITON 0.17  --   --   --   --   --   --   --  WBC  15.8*  --   --    < > 15.2* 12.1* 7.9 6.1  LATICACIDVEN  --  6.0* 3.6*  --   --   --   --   --    < > = values in this interval not displayed.    Liver Function Tests: Recent Labs  Lab 02/07/19 0401 02/08/19 0855 02/09/19 0359 02/10/19 0401 02/11/19 0451  AST 120* 53* 22 15 20   ALT 76* 65* 43 26 25  ALKPHOS 105 105 75 58 53  BILITOT 0.8 0.4 0.8 0.3 0.8  PROT 6.2* 6.7 5.3* 5.2* 5.2*  ALBUMIN 2.3* 2.3* 1.7* 2.6* 2.6*   No results for input(s): LIPASE, AMYLASE in the last 168 hours. No results for input(s): AMMONIA in the last 168 hours.  ABG    Component Value Date/Time   PHART 7.486 (H) 02/10/2019 1130   PCO2ART 38.4 02/10/2019 1130   PO2ART 50.0 (L) 02/10/2019 1130   HCO3 29.1 (H) 02/10/2019 1130   TCO2 30 02/10/2019 1130   ACIDBASEDEF 2.0 02/08/2019 1444   O2SAT 88.0 02/10/2019 1130     Coagulation Profile: No results for input(s): INR, PROTIME in the last 168 hours.  Cardiac Enzymes: No results for input(s): CKTOTAL, CKMB, CKMBINDEX, TROPONINI in the last 168 hours.  HbA1C: Hgb A1c MFr Bld  Date/Time Value Ref Range Status  02/06/2019 06:54 AM 12.6 (H) 4.8 - 5.6 % Final    Comment:    (NOTE) Pre diabetes:          5.7%-6.4% Diabetes:              >6.4% Glycemic control for   <7.0% adults with diabetes     CBG: Recent Labs  Lab 02/11/19 1143 02/11/19 1530 02/11/19 1918 02/11/19 2305 02/12/19 0310  GLUCAP 301* 323* 281* 195* 107*     The patient is critically ill with multiple organ systems failure and requires high complexity decision making for assessment and support, frequent evaluation and titration of therapies, application of advanced monitoring technologies and extensive interpretation of multiple databases. Critical Care Time devoted to patient care services described in this note independent of APP/resident time (if applicable)  is 35 minutes.   02/14/19 MD  Pulmonary Critical Care 02/12/2019 7:33 AM Personal pager: [phone] If  unanswered, please page CCM On-call: #[phone]

## 2019-02-13 ENCOUNTER — Inpatient Hospital Stay (HOSPITAL_COMMUNITY): Payer: HRSA Program

## 2019-02-13 DIAGNOSIS — Z9911 Dependence on respirator [ventilator] status: Secondary | ICD-10-CM

## 2019-02-13 LAB — POCT I-STAT 7, (LYTES, BLD GAS, ICA,H+H)
Acid-Base Excess: 6 mmol/L — ABNORMAL HIGH (ref 0.0–2.0)
Bicarbonate: 31.1 mmol/L — ABNORMAL HIGH (ref 20.0–28.0)
Calcium, Ion: 1.19 mmol/L (ref 1.15–1.40)
HCT: 32 % — ABNORMAL LOW (ref 39.0–52.0)
Hemoglobin: 10.9 g/dL — ABNORMAL LOW (ref 13.0–17.0)
O2 Saturation: 98 %
Patient temperature: 98.1
Potassium: 4.5 mmol/L (ref 3.5–5.1)
Sodium: 139 mmol/L (ref 135–145)
TCO2: 33 mmol/L — ABNORMAL HIGH (ref 22–32)
pCO2 arterial: 48.3 mmHg — ABNORMAL HIGH (ref 32.0–48.0)
pH, Arterial: 7.415 (ref 7.350–7.450)
pO2, Arterial: 108 mmHg (ref 83.0–108.0)

## 2019-02-13 LAB — GLUCOSE, CAPILLARY
Glucose-Capillary: 173 mg/dL — ABNORMAL HIGH (ref 70–99)
Glucose-Capillary: 226 mg/dL — ABNORMAL HIGH (ref 70–99)
Glucose-Capillary: 226 mg/dL — ABNORMAL HIGH (ref 70–99)
Glucose-Capillary: 272 mg/dL — ABNORMAL HIGH (ref 70–99)
Glucose-Capillary: 277 mg/dL — ABNORMAL HIGH (ref 70–99)
Glucose-Capillary: 278 mg/dL — ABNORMAL HIGH (ref 70–99)

## 2019-02-13 LAB — BASIC METABOLIC PANEL
Anion gap: 7 (ref 5–15)
BUN: 20 mg/dL (ref 6–20)
CO2: 29 mmol/L (ref 22–32)
Calcium: 7.5 mg/dL — ABNORMAL LOW (ref 8.9–10.3)
Chloride: 97 mmol/L — ABNORMAL LOW (ref 98–111)
Creatinine, Ser: 0.65 mg/dL (ref 0.61–1.24)
GFR calc Af Amer: >60 mL/min (ref >60)
GFR calc non Af Amer: >60 mL/min (ref >60)
Glucose, Bld: 209 mg/dL — ABNORMAL HIGH (ref 70–99)
Potassium: 3.8 mmol/L (ref 3.5–5.1)
Sodium: 133 mmol/L — ABNORMAL LOW (ref 135–145)

## 2019-02-13 LAB — CBC
HCT: 33.4 % — ABNORMAL LOW (ref 39.0–52.0)
Hemoglobin: 10.4 g/dL — ABNORMAL LOW (ref 13.0–17.0)
MCH: 27.7 pg (ref 26.0–34.0)
MCHC: 31.1 g/dL (ref 30.0–36.0)
MCV: 88.8 fL (ref 80.0–100.0)
Platelets: 267 10*3/uL (ref 150–400)
RBC: 3.76 MIL/uL — ABNORMAL LOW (ref 4.22–5.81)
RDW: 13 % (ref 11.5–15.5)
WBC: 10.5 10*3/uL (ref 4.0–10.5)
nRBC: 0 % (ref 0.0–0.2)

## 2019-02-13 LAB — MAGNESIUM: Magnesium: 1.9 mg/dL (ref 1.7–2.4)

## 2019-02-13 MED ORDER — INSULIN ASPART 100 UNIT/ML ~~LOC~~ SOLN
5.0000 [IU] | SUBCUTANEOUS | Status: DC
  Administered 2019-02-13 – 2019-02-14 (×6): 5 [IU] via SUBCUTANEOUS

## 2019-02-13 NOTE — Plan of Care (Signed)
  Problem: Activity: Goal: Ability to tolerate increased activity will improve Outcome: Not Progressing   

## 2019-02-13 NOTE — Progress Notes (Addendum)
NAME:  Andrew Bruce, MRN:  [id number], DOB:  14-Jul-1965, LOS: 7 ADMISSION DATE:  02/06/2019, CONSULTATION DATE:  02/06/19 REFERRING MD:  ER, CHIEF COMPLAINT:  SOB   Brief History   This is a 54 year old man without a past medical history who presents with worsening shortness of breath over the past 2 days in the context of a 10-day Covid illness.  Chest x-ray and vitals are consistent with a worsening Covid ARDS.  History of present illness   This is a 54 year old man without a past medical history who presents with worsening shortness of breath over the past 2 days in the context of a 10-day Covid illness.  Chest x-ray and vitals are consistent with a worsening Covid ARDS.  History performed with Spanish interpreter as patient is Spanish-speaking only.  He states he was feeling fatigued but stable since an ER visit last week for Covid.  Over the past 2 days he has noticed a dramatic increase in his shortness of breath.  He is dyspneic even speaking.  He was placed on a nonrebreather and nasal cannula in the ER and is still mildly tachypneic with sats in the 80s.  He denies chest pain, productive cough.  He does have a dry cough with inspiration that is paroxysmal.  He denies any leg swelling.  He has lost his sense of taste and smell.  PCCM consulted for admission.  Patient has never been hospitalized for.  He has no chronic medical issues and is on no home medications.  Past Medical History  None  Significant Hospital Events   02/06/2019 admitted  Consults:  None  Procedures:  Intubated 1/24 Bilateral chest tube placement 1/25  Significant Diagnostic Tests:  Chest x-ray with an ARDS pattern  Micro Data:  Covid positive Pct Neg  Antimicrobials:  Remdesivir 02/06/19>>1/28 Dexamethasone 02/06/19>>2/2   Interim history/subjective:  Remains critically ill intubated on mechanical life support  Objective   Blood pressure 103/64, pulse 70, temperature 99.3 F (37.4 C), temperature  source Axillary, resp. rate (!) 34, height 5\' 6"  (1.676 m), weight 89.3 kg, SpO2 92 %.    Vent Mode: PRVC FiO2 (%):  [50 %-75 %] 75 % Set Rate:  [34 bmp] 34 bmp Vt Set:  [380 mL] 380 mL PEEP:  [10 cmH20] 10 cmH20 Plateau Pressure:  [19 cmH20-26 cmH20] 23 cmH20   Intake/Output Summary (Last 24 hours) at 02/13/2019 1203 Last data filed at 02/13/2019 0700 Gross per 24 hour  Intake 1974.86 ml  Output 2925 ml  Net -950.14 ml   Filed Weights   02/11/19 0500 02/12/19 0500 02/13/19 0500  Weight: 87.5 kg 86.5 kg 89.3 kg   Examination: GEN: Hispanic male, intubated on mechanical life support critically ill HEENT: Endotracheal tube in place, NCAT CV: Supine, regular rhythm, S1-S2 PULM: Clear bilateral ventilated breath sounds GI: Soft, mildly distended abdomen  EXT: No significant edema NEURO: Withdraws to pain, opens eyes PSYCH: Sedated, however arousable SKIN: Lateral distal foot discoloration  Labs reviewed, sodium 133, potassium 3.8, creatinine 0.65, white blood cell count 10.4  Resolved Hospital Problem list   N/A  Assessment & Plan:   Acute hypoxemic respiratory failure due to COVID-19 pneumonia, ARDS, bilateral pneumothorax status post bilateral chest tube placement 02/08/2019 Continue mechanical ventilation per ARDS protocol Target TVol 6-8cc/kgIBW Target Plateau Pressure < 30cm H20 Target driving pressure less than 15 cm of water Target PaO2 55-65: titrate PEEP/FiO2 per protocol As long as PaO2 to FiO2 ratio is less than 1:150 position in prone  position for 16 hours a day Check CVP daily if CVL in place Target CVP less than 4, diurese as necessary Ventilator associated pneumonia prevention protocol Weaning sedation as tolerated We will plan to reprone again this evening  COVID-19 pneumonia/ARDS Remdesivir x5 days, dexamethasone x10 days Continue Lovenox 0.5 mg/kg twice daily  Steroid-induced hyperglycemia Levemir twice daily plus SSI  At risk malnutrition Tube  feeding continue  Oliguria, hypernatremia- Continue to follow urine output Follow bmet  Best practice:  Diet: NPO, TF Pain/Anxiety/Delirium protocol (if indicated): See above VAP protocol (if indicated): Ordered DVT prophylaxis: 0.5mg /kg BID Lovenox GI prophylaxis: Pepcid Glucose control: SSI and Levemir Mobility: Bedrest Code Status: Full Family Communication: Family telemetry visit yesterday.  I will attempt to reach family again today.  Called earlier with no response via phone. Disposition: ICU  Labs   CBC: Recent Labs  Lab 02/08/19 0855 02/08/19 1444 02/09/19 0359 02/09/19 1223 02/10/19 0401 02/10/19 1130 02/11/19 0451 02/12/19 0623 02/13/19 0551  WBC 15.2*  --  12.1*  --  7.9  --  6.1 8.1 10.5  NEUTROABS 12.9*  --  10.1*  --  6.8  --  4.8 6.2  --   HGB 13.9   < > 12.9*   < > 10.7* 10.2* 10.0* 11.3* 10.4*  HCT 46.2   < > 42.5   < > 34.9* 30.0* 32.9* 37.2* 33.4*  MCV 93.1  --  91.2  --  90.6  --  90.9 91.0 88.8  PLT 331  --  327  --  274  --  278 273 267   < > = values in this interval not displayed.    Basic Metabolic Panel: Recent Labs  Lab 02/08/19 0855 02/08/19 0855 02/08/19 1304 02/08/19 1444 02/09/19 0359 02/09/19 1223 02/09/19 1600 02/10/19 0401 02/10/19 1130 02/11/19 0451 02/12/19 0623 02/13/19 0551  NA 146*  --   --    < > 145   < >  --  148* 150* 148* 145 133*  K 4.6  --   --    < > 4.0   < >  --  3.4* 4.1 4.4 3.9 3.8  CL 109   < >  --   --  112*  --   --  111  --  107 108 97*  CO2 26   < >  --   --  24  --   --  30  --  29 30 29   GLUCOSE 81   < >  --   --  294*  --   --  214*  --  213* 110* 209*  BUN 34*   < >  --   --  25*  --   --  25*  --  26* 28* 20  CREATININE 1.23   < >  --   --  0.87  --   --  0.77  --  0.67 0.61 0.65  CALCIUM 7.6*   < >  --   --  7.4*  --   --  7.4*  --  8.2* 8.1* 7.5*  MG 2.7*   < > 2.7*   < > 2.3  --  2.5*  --   --  2.7* 2.7* 1.9  PHOS 7.0*  --  5.8*  --  3.2  --  2.5  --   --   --   --   --    < > = values in  this interval not displayed.  GFR: Estimated Creatinine Clearance: 113.1 mL/min (by C-G formula based on SCr of 0.65 mg/dL). Recent Labs  Lab 02/10/19 0401 02/11/19 0451 02/12/19 0623 02/13/19 0551  WBC 7.9 6.1 8.1 10.5    Liver Function Tests: Recent Labs  Lab 02/08/19 0855 02/09/19 0359 02/10/19 0401 02/11/19 0451 02/12/19 0623  AST 53* 22 15 20 21   ALT 65* 43 26 25 28   ALKPHOS 105 75 58 53 63  BILITOT 0.4 0.8 0.3 0.8 0.4  PROT 6.7 5.3* 5.2* 5.2* 5.4*  ALBUMIN 2.3* 1.7* 2.6* 2.6* 2.6*   No results for input(s): LIPASE, AMYLASE in the last 168 hours. No results for input(s): AMMONIA in the last 168 hours.  ABG    Component Value Date/Time   PHART 7.486 (H) 02/10/2019 1130   PCO2ART 38.4 02/10/2019 1130   PO2ART 50.0 (L) 02/10/2019 1130   HCO3 29.1 (H) 02/10/2019 1130   TCO2 30 02/10/2019 1130   ACIDBASEDEF 2.0 02/08/2019 1444   O2SAT 88.0 02/10/2019 1130     Coagulation Profile: No results for input(s): INR, PROTIME in the last 168 hours.  Cardiac Enzymes: No results for input(s): CKTOTAL, CKMB, CKMBINDEX, TROPONINI in the last 168 hours.  HbA1C: Hgb A1c MFr Bld  Date/Time Value Ref Range Status  02/06/2019 06:54 AM 12.6 (H) 4.8 - 5.6 % Final    Comment:    (NOTE) Pre diabetes:          5.7%-6.4% Diabetes:              >6.4% Glycemic control for   <7.0% adults with diabetes     CBG: Recent Labs  Lab 02/12/19 1940 02/12/19 2348 02/13/19 0358 02/13/19 0754 02/13/19 1143  GLUCAP 228* 195* 173* 226* 278*    This patient is critically ill with multiple organ system failure; which, requires frequent high complexity decision making, assessment, support, evaluation, and titration of therapies. This was completed through the application of advanced monitoring technologies and extensive interpretation of multiple databases. During this encounter critical care time was devoted to patient care services described in this note for 33 minutes.  02/15/19, DO [place] [facility] 02/13/2019 12:03 PM

## 2019-02-13 NOTE — Progress Notes (Signed)
Based on latest ABG, PF ratio is 1.5. O2 Sats 93-97%. No Need to prone based on goal of PF >1:15.

## 2019-02-14 DIAGNOSIS — R451 Restlessness and agitation: Secondary | ICD-10-CM

## 2019-02-14 LAB — GLUCOSE, CAPILLARY
Glucose-Capillary: 134 mg/dL — ABNORMAL HIGH (ref 70–99)
Glucose-Capillary: 112 mg/dL — ABNORMAL HIGH (ref 70–99)
Glucose-Capillary: 149 mg/dL — ABNORMAL HIGH (ref 70–99)
Glucose-Capillary: 265 mg/dL — ABNORMAL HIGH (ref 70–99)
Glucose-Capillary: 252 mg/dL — ABNORMAL HIGH (ref 70–99)
Glucose-Capillary: 198 mg/dL — ABNORMAL HIGH (ref 70–99)

## 2019-02-14 LAB — CBC
HCT: 35.3 % — ABNORMAL LOW (ref 39.0–52.0)
Hemoglobin: 11.1 g/dL — ABNORMAL LOW (ref 13.0–17.0)
MCH: 28.3 pg (ref 26.0–34.0)
MCHC: 31.4 g/dL (ref 30.0–36.0)
MCV: 90.1 fL (ref 80.0–100.0)
Platelets: 326 10*3/uL (ref 150–400)
RBC: 3.92 MIL/uL — ABNORMAL LOW (ref 4.22–5.81)
RDW: 13.3 % (ref 11.5–15.5)
WBC: 12.1 10*3/uL — ABNORMAL HIGH (ref 4.0–10.5)
nRBC: 0 % (ref 0.0–0.2)

## 2019-02-14 LAB — BASIC METABOLIC PANEL
Anion gap: 11 (ref 5–15)
BUN: 20 mg/dL (ref 6–20)
CO2: 31 mmol/L (ref 22–32)
Calcium: 8.3 mg/dL — ABNORMAL LOW (ref 8.9–10.3)
Chloride: 100 mmol/L (ref 98–111)
Creatinine, Ser: 0.6 mg/dL — ABNORMAL LOW (ref 0.61–1.24)
GFR calc Af Amer: >60 mL/min (ref >60)
GFR calc non Af Amer: >60 mL/min (ref >60)
Glucose, Bld: 149 mg/dL — ABNORMAL HIGH (ref 70–99)
Potassium: 3.8 mmol/L (ref 3.5–5.1)
Sodium: 142 mmol/L (ref 135–145)

## 2019-02-14 LAB — POCT I-STAT 7, (LYTES, BLD GAS, ICA,H+H)
Acid-Base Excess: 10 mmol/L — ABNORMAL HIGH (ref 0.0–2.0)
Bicarbonate: 34.2 mmol/L — ABNORMAL HIGH (ref 20.0–28.0)
Calcium, Ion: 1.13 mmol/L — ABNORMAL LOW (ref 1.15–1.40)
HCT: 41 % (ref 39.0–52.0)
Hemoglobin: 13.9 g/dL (ref 13.0–17.0)
O2 Saturation: 92 %
Patient temperature: 100
Potassium: 4.1 mmol/L (ref 3.5–5.1)
Sodium: 135 mmol/L (ref 135–145)
TCO2: 35 mmol/L — ABNORMAL HIGH (ref 22–32)
pCO2 arterial: 43.3 mmHg (ref 32.0–48.0)
pH, Arterial: 7.508 — ABNORMAL HIGH (ref 7.350–7.450)
pO2, Arterial: 59 mmHg — ABNORMAL LOW (ref 83.0–108.0)

## 2019-02-14 LAB — MAGNESIUM: Magnesium: 2.2 mg/dL (ref 1.7–2.4)

## 2019-02-14 MED ORDER — FENTANYL 2500MCG IN NS 250ML (10MCG/ML) PREMIX INFUSION
50.0000 ug/h | INTRAVENOUS | Status: DC
  Administered 2019-02-14: 150 ug/h via INTRAVENOUS
  Filled 2019-02-14: qty 250

## 2019-02-14 MED ORDER — FREE WATER
250.0000 mL | Freq: Three times a day (TID) | Status: DC
  Administered 2019-02-14 – 2019-02-17 (×9): 250 mL

## 2019-02-14 MED ORDER — FENTANYL BOLUS VIA INFUSION
50.0000 ug | INTRAVENOUS | Status: DC | PRN
  Administered 2019-02-14 – 2019-02-15 (×2): 50 ug via INTRAVENOUS
  Filled 2019-02-14: qty 50

## 2019-02-14 MED ORDER — INSULIN ASPART 100 UNIT/ML ~~LOC~~ SOLN
5.0000 [IU] | SUBCUTANEOUS | Status: DC
  Administered 2019-02-14 – 2019-02-16 (×10): 5 [IU] via SUBCUTANEOUS

## 2019-02-14 MED ORDER — CLONAZEPAM 1 MG PO TABS
2.0000 mg | ORAL_TABLET | Freq: Two times a day (BID) | ORAL | Status: DC
  Administered 2019-02-14 – 2019-02-26 (×24): 2 mg
  Filled 2019-02-14 (×24): qty 2

## 2019-02-14 MED ORDER — MIDAZOLAM HCL 2 MG/2ML IJ SOLN
2.0000 mg | INTRAMUSCULAR | Status: DC | PRN
  Administered 2019-02-14: 16:00:00 2 mg via INTRAVENOUS

## 2019-02-14 MED ORDER — FENTANYL CITRATE (PF) 100 MCG/2ML IJ SOLN
50.0000 ug | Freq: Once | INTRAMUSCULAR | Status: AC
  Administered 2019-02-14: 50 ug via INTRAVENOUS

## 2019-02-14 MED ORDER — METOLAZONE 5 MG PO TABS
10.0000 mg | ORAL_TABLET | Freq: Once | ORAL | Status: AC
  Administered 2019-02-14: 10 mg via ORAL
  Filled 2019-02-14: qty 2

## 2019-02-14 MED ORDER — MIDAZOLAM 50MG/50ML (1MG/ML) PREMIX INFUSION
0.0000 mg/h | INTRAVENOUS | Status: DC
  Administered 2019-02-14: 13:00:00 2 mg/h via INTRAVENOUS
  Administered 2019-02-14 – 2019-02-15 (×2): 5 mg/h via INTRAVENOUS
  Filled 2019-02-14 (×3): qty 50

## 2019-02-14 MED ORDER — FENTANYL 2500MCG IN NS 250ML (10MCG/ML) PREMIX INFUSION: 50.0000 ug/h | INTRAVENOUS | Status: DC

## 2019-02-14 MED ORDER — FUROSEMIDE 10 MG/ML IJ SOLN
40.0000 mg | Freq: Four times a day (QID) | INTRAMUSCULAR | Status: AC
  Administered 2019-02-14 – 2019-02-15 (×3): 40 mg via INTRAVENOUS
  Filled 2019-02-14 (×3): qty 4

## 2019-02-14 MED ORDER — POTASSIUM CHLORIDE 20 MEQ/15ML (10%) PO SOLN
40.0000 meq | Freq: Three times a day (TID) | ORAL | Status: AC
  Administered 2019-02-14 (×2): 40 meq
  Filled 2019-02-14 (×2): qty 30

## 2019-02-14 MED ORDER — MIDAZOLAM HCL 2 MG/2ML IJ SOLN
2.0000 mg | INTRAMUSCULAR | Status: DC | PRN
  Administered 2019-02-14: 2 mg via INTRAVENOUS

## 2019-02-14 MED ORDER — FENTANYL CITRATE (PF) 100 MCG/2ML IJ SOLN: 50.0000 ug | Freq: Once | INTRAMUSCULAR | Status: DC

## 2019-02-14 NOTE — Progress Notes (Signed)
NAME:  Gina Costilla, MRN:  [id number], DOB:  1965/08/12, LOS: 8 ADMISSION DATE:  02/06/2019, CONSULTATION DATE:  02/06/19 REFERRING MD:  ER, CHIEF COMPLAINT:  SOB   Brief History   This is a 54 year old man without a past medical history who presents with worsening shortness of breath over the past 2 days in the context of a 10-day Covid illness.  Chest x-ray and vitals are consistent with a worsening Covid ARDS.  History of present illness   This is a 54 year old man without a past medical history who presents with worsening shortness of breath over the past 2 days in the context of a 10-day Covid illness.  Chest x-ray and vitals are consistent with a worsening Covid ARDS.  History performed with Spanish interpreter as patient is Spanish-speaking only.  He states he was feeling fatigued but stable since an ER visit last week for Covid.  Over the past 2 days he has noticed a dramatic increase in his shortness of breath.  He is dyspneic even speaking.  He was placed on a nonrebreather and nasal cannula in the ER and is still mildly tachypneic with sats in the 80s.  He denies chest pain, productive cough.  He does have a dry cough with inspiration that is paroxysmal.  He denies any leg swelling.  He has lost his sense of taste and smell.  PCCM consulted for admission.  Patient has never been hospitalized for.  He has no chronic medical issues and is on no home medications.  Past Medical History  None  Significant Hospital Events   02/06/2019 admitted  Consults:  None  Procedures:  Intubated 1/24 Bilateral chest tube placement 1/25  Significant Diagnostic Tests:  Chest x-ray with an ARDS pattern  Micro Data:  Covid positive Pct Neg  Antimicrobials:  Remdesivir 02/06/19>>1/28 Dexamethasone 02/06/19>>2/2   Interim history/subjective:  No events overnight Tolerated proning well  Objective   Blood pressure (!) 97/59, pulse 98, temperature 98.9 F (37.2 C), temperature source  Axillary, resp. rate (!) 37, height 5\' 6"  (1.676 m), weight 83.6 kg, SpO2 91 %.    Vent Mode: PRVC FiO2 (%):  [70 %-75 %] 70 % Set Rate:  [34 bmp] 34 bmp Vt Set:  [380 mL] 380 mL PEEP:  [10 cmH20] 10 cmH20 Plateau Pressure:  [14 cmH20-25 cmH20] 14 cmH20   Intake/Output Summary (Last 24 hours) at 02/14/2019 1136 Last data filed at 02/14/2019 1023 Gross per 24 hour  Intake 3178.07 ml  Output 2035 ml  Net 1143.07 ml   Filed Weights   02/12/19 0500 02/13/19 0500 02/14/19 0135  Weight: 86.5 kg 89.3 kg 83.6 kg   Examination: GEN: Acutely ill appearing male, NAD, sedate HEENT: [place]/AT, PERRL, EOM-I and MMM, ETT in place CV: RRR, Nl S1/S2 and -M/R/G PULM: Coarse diffusely GI: Soft, NT, ND and +BS EXT: -edema and -tenderness NEURO: Sedate, not following commands SKIN: Lateral distal foot discoloration  I reviewed CXR myself, ETT and bilateral CT in good position, infiltrate noted  All labs reviewed  Discussed with bedside RN and RT  Resolved Hospital Problem list   N/A  Assessment & Plan:   Acute hypoxemic respiratory failure due to COVID-19 pneumonia, ARDS, bilateral pneumothorax status post bilateral chest tube placement 02/08/2019 Continue mechanical ventilation per ARDS protocol Target TVol 6-8cc/kgIBW Target Plateau Pressure < 30cm H20 Target driving pressure less than 15 cm of water Target PaO2 55-65: titrate PEEP/FiO2 per protocol As long as PaO2 to FiO2 ratio is less than 1:150  position in prone position for 16 hours a day Active diureses today Lasix 40 mg IV q6 x3 doses Zaroxolyn 10 mg PO x1 Replace K VAP prevention protocol Propofol and fentanyl on board, will attempt to do without propofol today given duration of infusion Increase Clonazepam to 2 mg BID Add seroquel 25 BID DC proning Change to PCV, patient appears much more comfortable and less dyssynchronous, increase PEEP to 12 and place on rate of 15, 13/12 and drop FiO2 to 60%, no further proning  COVID-19  pneumonia/ARDS Remdesivir x5 days, dexamethasone x10 days Continue Lovenox 0.5 mg/kg twice daily  Steroid-induced hyperglycemia Levemir twice daily plus SSI, given steroids  At risk malnutrition Continue TF at goal  Oliguria, hypernatremia- Continue to follow urine output BMET in AM Replace electrolytes as indicated Add low dose free water given diureses, 250 q8  Best practice:  Diet: NPO, TF Pain/Anxiety/Delirium protocol (if indicated): See above VAP protocol (if indicated): Ordered DVT prophylaxis: 0.5mg /kg BID Lovenox GI prophylaxis: Pepcid Glucose control: SSI and Levemir Mobility: Bedrest Code Status: Full Family Communication: Family telemetry visit yesterday.  I will attempt to reach family again today.  Called earlier with no response via phone. Disposition: ICU  Labs   CBC: Recent Labs  Lab 02/08/19 0855 02/08/19 1444 02/09/19 0359 02/09/19 1223 02/10/19 0401 02/10/19 1130 02/11/19 0451 02/12/19 3474 02/13/19 0551 02/13/19 2023 02/14/19 0428  WBC 15.2*  --  12.1*  --  7.9  --  6.1 8.1 10.5  --  12.1*  NEUTROABS 12.9*  --  10.1*  --  6.8  --  4.8 6.2  --   --   --   HGB 13.9   < > 12.9*   < > 10.7*   < > 10.0* 11.3* 10.4* 10.9* 11.1*  HCT 46.2   < > 42.5   < > 34.9*   < > 32.9* 37.2* 33.4* 32.0* 35.3*  MCV 93.1  --  91.2  --  90.6  --  90.9 91.0 88.8  --  90.1  PLT 331  --  327  --  274  --  278 273 267  --  326   < > = values in this interval not displayed.    Basic Metabolic Panel: Recent Labs  Lab 02/08/19 0855 02/08/19 0855 02/08/19 1304 02/08/19 1444 02/09/19 0359 02/09/19 0359 02/09/19 1223 02/09/19 1600 02/10/19 0401 02/10/19 1130 02/11/19 0451 02/12/19 0623 02/13/19 0551 02/13/19 2023 02/14/19 0428  NA 146*  --   --    < > 145   < >   < >  --  148*   < > 148* 145 133* 139 142  K 4.6  --   --    < > 4.0   < >   < >  --  3.4*   < > 4.4 3.9 3.8 4.5 3.8  CL 109   < >  --   --  112*   < >  --   --  111  --  107 108 97*  --  100  CO2  26   < >  --   --  24   < >  --   --  30  --  29 30 29   --  31  GLUCOSE 81   < >  --   --  294*   < >  --   --  214*  --  213* 110* 209*  --  149*  BUN 34*   < >  --   --  25*   < >  --   --  25*  --  26* 28* 20  --  20  CREATININE 1.23   < >  --   --  0.87   < >  --   --  0.77  --  0.67 0.61 0.65  --  0.60*  CALCIUM 7.6*   < >  --   --  7.4*   < >  --   --  7.4*  --  8.2* 8.1* 7.5*  --  8.3*  MG 2.7*   < > 2.7*   < > 2.3  --   --  2.5*  --   --  2.7* 2.7* 1.9  --  2.2  PHOS 7.0*  --  5.8*  --  3.2  --   --  2.5  --   --   --   --   --   --   --    < > = values in this interval not displayed.   GFR: Estimated Creatinine Clearance: 109.5 mL/min (A) (by C-G formula based on SCr of 0.6 mg/dL (L)). Recent Labs  Lab 02/11/19 0451 02/12/19 0623 02/13/19 0551 02/14/19 0428  WBC 6.1 8.1 10.5 12.1*    Liver Function Tests: Recent Labs  Lab 02/08/19 0855 02/09/19 0359 02/10/19 0401 02/11/19 0451 02/12/19 0623  AST 53* 22 15 20 21   ALT 65* 43 26 25 28   ALKPHOS 105 75 58 53 63  BILITOT 0.4 0.8 0.3 0.8 0.4  PROT 6.7 5.3* 5.2* 5.2* 5.4*  ALBUMIN 2.3* 1.7* 2.6* 2.6* 2.6*   No results for input(s): LIPASE, AMYLASE in the last 168 hours. No results for input(s): AMMONIA in the last 168 hours.  ABG    Component Value Date/Time   PHART 7.415 02/13/2019 2023   PCO2ART 48.3 (H) 02/13/2019 2023   PO2ART 108.0 02/13/2019 2023   HCO3 31.1 (H) 02/13/2019 2023   TCO2 33 (H) 02/13/2019 2023   ACIDBASEDEF 2.0 02/08/2019 1444   O2SAT 98.0 02/13/2019 2023     Coagulation Profile: No results for input(s): INR, PROTIME in the last 168 hours.  Cardiac Enzymes: No results for input(s): CKTOTAL, CKMB, CKMBINDEX, TROPONINI in the last 168 hours.  HbA1C: Hgb A1c MFr Bld  Date/Time Value Ref Range Status  02/06/2019 06:54 AM 12.6 (H) 4.8 - 5.6 % Final    Comment:    (NOTE) Pre diabetes:          5.7%-6.4% Diabetes:              >6.4% Glycemic control for   <7.0% adults with diabetes      CBG: Recent Labs  Lab 02/13/19 1546 02/13/19 2010 02/13/19 2345 02/14/19 0351 02/14/19 0847  GLUCAP 272* 277* 226* 134* 112*   The patient is critically ill with multiple organ systems failure and requires high complexity decision making for assessment and support, frequent evaluation and titration of therapies, application of advanced monitoring technologies and extensive interpretation of multiple databases.   Critical Care Time devoted to patient care services described in this note is  32  Minutes. This time reflects time of care of this signee Dr [name]. This critical care time does not reflect procedure time, or teaching time or supervisory time of PA/NP/Med student/Med Resident etc but could involve care discussion time.  Rush Farmer, M.D. [facility].

## 2019-02-15 ENCOUNTER — Inpatient Hospital Stay (HOSPITAL_COMMUNITY): Payer: HRSA Program

## 2019-02-15 LAB — POCT I-STAT 7, (LYTES, BLD GAS, ICA,H+H)
Acid-Base Excess: 15 mmol/L — ABNORMAL HIGH (ref 0.0–2.0)
Bicarbonate: 41.2 mmol/L — ABNORMAL HIGH (ref 20.0–28.0)
Calcium, Ion: 1.14 mmol/L — ABNORMAL LOW (ref 1.15–1.40)
HCT: 39 % (ref 39.0–52.0)
Hemoglobin: 13.3 g/dL (ref 13.0–17.0)
O2 Saturation: 95 %
Patient temperature: 98.6
Potassium: 3.9 mmol/L (ref 3.5–5.1)
Sodium: 136 mmol/L (ref 135–145)
TCO2: 43 mmol/L — ABNORMAL HIGH (ref 22–32)
pCO2 arterial: 53.3 mmHg — ABNORMAL HIGH (ref 32.0–48.0)
pH, Arterial: 7.496 — ABNORMAL HIGH (ref 7.350–7.450)
pO2, Arterial: 74 mmHg — ABNORMAL LOW (ref 83.0–108.0)

## 2019-02-15 LAB — CBC
HCT: 40.2 % (ref 39.0–52.0)
Hemoglobin: 12.6 g/dL — ABNORMAL LOW (ref 13.0–17.0)
MCH: 27.9 pg (ref 26.0–34.0)
MCHC: 31.3 g/dL (ref 30.0–36.0)
MCV: 88.9 fL (ref 80.0–100.0)
Platelets: 425 10*3/uL — ABNORMAL HIGH (ref 150–400)
RBC: 4.52 MIL/uL (ref 4.22–5.81)
RDW: 13.7 % (ref 11.5–15.5)
WBC: 12.6 10*3/uL — ABNORMAL HIGH (ref 4.0–10.5)
nRBC: 0 % (ref 0.0–0.2)

## 2019-02-15 LAB — BASIC METABOLIC PANEL
Anion gap: 10 (ref 5–15)
BUN: 25 mg/dL — ABNORMAL HIGH (ref 6–20)
CO2: 38 mmol/L — ABNORMAL HIGH (ref 22–32)
Calcium: 8.8 mg/dL — ABNORMAL LOW (ref 8.9–10.3)
Chloride: 90 mmol/L — ABNORMAL LOW (ref 98–111)
Creatinine, Ser: 0.66 mg/dL (ref 0.61–1.24)
GFR calc Af Amer: >60 mL/min (ref >60)
GFR calc non Af Amer: >60 mL/min (ref >60)
Glucose, Bld: 108 mg/dL — ABNORMAL HIGH (ref 70–99)
Potassium: 3.9 mmol/L (ref 3.5–5.1)
Sodium: 138 mmol/L (ref 135–145)

## 2019-02-15 LAB — GLUCOSE, CAPILLARY
Glucose-Capillary: 96 mg/dL (ref 70–99)
Glucose-Capillary: 146 mg/dL — ABNORMAL HIGH (ref 70–99)
Glucose-Capillary: 192 mg/dL — ABNORMAL HIGH (ref 70–99)
Glucose-Capillary: 194 mg/dL — ABNORMAL HIGH (ref 70–99)
Glucose-Capillary: 181 mg/dL — ABNORMAL HIGH (ref 70–99)
Glucose-Capillary: 247 mg/dL — ABNORMAL HIGH (ref 70–99)

## 2019-02-15 LAB — MAGNESIUM: Magnesium: 2.5 mg/dL — ABNORMAL HIGH (ref 1.7–2.4)

## 2019-02-15 LAB — PHOSPHORUS: Phosphorus: 5.1 mg/dL — ABNORMAL HIGH (ref 2.5–4.6)

## 2019-02-15 MED ORDER — FUROSEMIDE 10 MG/ML IJ SOLN
40.0000 mg | Freq: Four times a day (QID) | INTRAMUSCULAR | Status: AC
  Administered 2019-02-15 (×2): 40 mg via INTRAVENOUS
  Filled 2019-02-15 (×2): qty 4

## 2019-02-15 MED ORDER — METOLAZONE 5 MG PO TABS
5.0000 mg | ORAL_TABLET | Freq: Once | ORAL | Status: AC
  Administered 2019-02-15: 5 mg via ORAL
  Filled 2019-02-15: qty 1

## 2019-02-15 MED ORDER — DEXMEDETOMIDINE HCL IN NACL 400 MCG/100ML IV SOLN
0.0000 ug/kg/h | INTRAVENOUS | Status: DC
  Administered 2019-02-15: 15:00:00 0.4 ug/kg/h via INTRAVENOUS
  Administered 2019-02-15: 21:00:00 1.2 ug/kg/h via INTRAVENOUS
  Administered 2019-02-16: 03:00:00 0.6 ug/kg/h via INTRAVENOUS
  Filled 2019-02-15 (×3): qty 100

## 2019-02-15 NOTE — Progress Notes (Signed)
Video call with family. Patient not rsponding but coughing and over breathing the vent

## 2019-02-15 NOTE — Plan of Care (Signed)
  Problem: Activity: Goal: Ability to tolerate increased activity will improve Outcome: Progressing   Problem: Respiratory: Goal: Ability to maintain a clear airway and adequate ventilation will improve Outcome: Progressing   Problem: Role Relationship: Goal: Method of communication will improve Outcome: Progressing   

## 2019-02-15 NOTE — Progress Notes (Signed)
NAME:  Andrew Bruce, MRN:  [id number], DOB:  [birth year], LOS: 9 ADMISSION DATE:  02/06/2019, CONSULTATION DATE:  02/06/19 REFERRING MD:  ER, CHIEF COMPLAINT:  SOB   Brief History   This is a 54 year old man without a past medical history who presents with worsening shortness of breath over the past 2 days in the context of a 10-day Covid illness.  Chest x-ray and vitals are consistent with a worsening Covid ARDS.  History of present illness   This is a 54 year old man without a past medical history who presents with worsening shortness of breath over the past 2 days in the context of a 10-day Covid illness.  Chest x-ray and vitals are consistent with a worsening Covid ARDS.  History performed with Spanish interpreter as patient is Spanish-speaking only.  He states he was feeling fatigued but stable since an ER visit last week for Covid.  Over the past 2 days he has noticed a dramatic increase in his shortness of breath.  He is dyspneic even speaking.  He was placed on a nonrebreather and nasal cannula in the ER and is still mildly tachypneic with sats in the 80s.  He denies chest pain, productive cough.  He does have a dry cough with inspiration that is paroxysmal.  He denies any leg swelling.  He has lost his sense of taste and smell.  PCCM consulted for admission.  Patient has never been hospitalized for.  He has no chronic medical issues and is on no home medications.  Past Medical History  None  Significant Hospital Events   02/06/2019 admitted  Consults:  None  Procedures:  Intubated 1/24 Bilateral chest tube placement 1/25  Significant Diagnostic Tests:  Chest x-ray with an ARDS pattern  Micro Data:  Covid positive Pct Neg  Antimicrobials:  Remdesivir 02/06/19>>1/28 Dexamethasone 02/06/19>>2/2   Interim history/subjective:  Patient intubated on mechanical ventilation, critically ill.  Objective   Blood pressure 107/68, pulse 90, temperature 99 F (37.2 C), temperature  source Oral, resp. rate 14, height 5\' 6"  (1.676 m), weight 83 kg, SpO2 94 %.    Vent Mode: CPAP;PSV FiO2 (%):  [40 %-60 %] 40 % Set Rate:  [15 bmp] 15 bmp PEEP:  [5 cmH20-12 cmH20] 5 cmH20 Pressure Support:  [5 cmH20] 5 cmH20 Plateau Pressure:  [18 cmH20-31 cmH20] 18 cmH20   Intake/Output Summary (Last 24 hours) at 02/15/2019 1333 Last data filed at 02/15/2019 1331 Gross per 24 hour  Intake 2998.98 ml  Output 5910 ml  Net -2911.02 ml   Filed Weights   02/13/19 0500 02/14/19 0135 02/15/19 0306  Weight: 89.3 kg 83.6 kg 83 kg   Examination: General appearance: 54 y.o., male, intubated on mechanical ventilation Eyes: Will open eyes spontaneously.  Not following commands HENT: NCAT, sclera clear mucous membranes moist Neck: Endotracheal tube in place Lungs: Clear to auscultation bilaterally, bilateral ventilated breath sounds no crackles no wheeze CV: Regular rate and rhythm, S1-S2 Abdomen: Soft, nontender nondistended Extremities: No peripheral edema Psych: Sedated on mechanical ventilation Neuro: Moves upper and lower extremities spontaneously.   Chest x-ray reviewed.  Bilateral chest tubes in place infiltrates somewhat improved. The patient's images have been independently reviewed by me.    Resolved Hospital Problem list   N/A  Assessment & Plan:   Acute hypoxemic respiratory failure due to COVID-19 pneumonia, ARDS, bilateral pneumothorax status post bilateral chest tube placement 02/08/2019 Patient remains on full mechanical ventilatory support at this time. Continue to wean ARDS protocol Continue to wean  PEEP and FiO2 to maintain sats above 88%. Patient tolerating SBT this morning. Patient and pressure support CPAP with 8/5. Patient tolerating this well with tidal volumes in the mid 500s. Continuous sedation stopped Versed and fentanyl. Patient transitioned to Precedex Patient still has a positive cumulative fluid balance. Continue diuresis today with Lasix plus  metolazone x1.  COVID-19 pneumonia/ARDS Please 5 days of remdesivir, 10 days of dexamethasone Continue Lovenox 0.5 mg/kg DVT PPx  Steroid-induced hyperglycemia Levemir plus SSI  At risk malnutrition Continue tube feeds at goal Appreciate RD support.  Oliguria, hypernatremia- Continue to follow urine output as indicated Any free water replacement  Best practice:  Diet: NPO, TF Pain/Anxiety/Delirium protocol (if indicated): See above VAP protocol (if indicated): Ordered DVT prophylaxis: 0.5mg /kg BID Lovenox GI prophylaxis: Pepcid Glucose control: SSI and Levemir Mobility: Bedrest Code Status: Full Family Communication: Nursing staff spoke with patient's family this morning.  Patient had a video visit with family yesterday.  I called this afternoon with no answer. Disposition: ICU  Labs   CBC: Recent Labs  Lab 02/09/19 0359 02/09/19 1223 02/10/19 0401 02/10/19 1130 02/11/19 0451 02/11/19 0451 02/12/19 0272 02/12/19 5366 02/13/19 0551 02/13/19 0551 02/13/19 2023 02/14/19 0428 02/14/19 1421 02/15/19 0349 02/15/19 0530  WBC 12.1*  --  7.9  --  6.1  --  8.1  --  10.5  --   --  12.1*  --   --  12.6*  NEUTROABS 10.1*  --  6.8  --  4.8  --  6.2  --   --   --   --   --   --   --   --   HGB 12.9*   < > 10.7*   < > 10.0*   < > 11.3*   < > 10.4*   < > 10.9* 11.1* 13.9 13.3 12.6*  HCT 42.5   < > 34.9*   < > 32.9*   < > 37.2*   < > 33.4*   < > 32.0* 35.3* 41.0 39.0 40.2  MCV 91.2  --  90.6  --  90.9  --  91.0  --  88.8  --   --  90.1  --   --  88.9  PLT 327  --  274  --  278  --  273  --  267  --   --  326  --   --  425*   < > = values in this interval not displayed.    Basic Metabolic Panel: Recent Labs  Lab 02/09/19 0359 02/09/19 1223 02/09/19 1600 02/10/19 0401 02/11/19 0451 02/11/19 0451 02/12/19 4403 02/12/19 4742 02/13/19 0551 02/13/19 0551 02/13/19 2023 02/14/19 0428 02/14/19 1421 02/15/19 0349 02/15/19 0530  NA 145   < >  --    < > 148*   < > 145   <  > 133*   < > 139 142 135 136 138  K 4.0   < >  --    < > 4.4   < > 3.9   < > 3.8   < > 4.5 3.8 4.1 3.9 3.9  CL 112*  --   --    < > 107  --  108  --  97*  --   --  100  --   --  90*  CO2 24  --   --    < > 29  --  30  --  29  --   --  31  --   --  38*  GLUCOSE 294*  --   --    < > 213*  --  110*  --  209*  --   --  149*  --   --  108*  BUN 25*  --   --    < > 26*  --  28*  --  20  --   --  20  --   --  25*  CREATININE 0.87  --   --    < > 0.67  --  0.61  --  0.65  --   --  0.60*  --   --  0.66  CALCIUM 7.4*  --   --    < > 8.2*  --  8.1*  --  7.5*  --   --  8.3*  --   --  8.8*  MG 2.3  --  2.5*  --  2.7*  --  2.7*  --  1.9  --   --  2.2  --   --  2.5*  PHOS 3.2  --  2.5  --   --   --   --   --   --   --   --   --   --   --  5.1*   < > = values in this interval not displayed.   GFR: Estimated Creatinine Clearance: 109.2 mL/min (by C-G formula based on SCr of 0.66 mg/dL). Recent Labs  Lab 02/12/19 0623 02/13/19 0551 02/14/19 0428 02/15/19 0530  WBC 8.1 10.5 12.1* 12.6*    Liver Function Tests: Recent Labs  Lab 02/09/19 0359 02/10/19 0401 02/11/19 0451 02/12/19 0623  AST 22 15 20 21   ALT 43 26 25 28   ALKPHOS 75 58 53 63  BILITOT 0.8 0.3 0.8 0.4  PROT 5.3* 5.2* 5.2* 5.4*  ALBUMIN 1.7* 2.6* 2.6* 2.6*   No results for input(s): LIPASE, AMYLASE in the last 168 hours. No results for input(s): AMMONIA in the last 168 hours.  ABG    Component Value Date/Time   PHART 7.496 (H) 02/15/2019 0349   PCO2ART 53.3 (H) 02/15/2019 0349   PO2ART 74.0 (L) 02/15/2019 0349   HCO3 41.2 (H) 02/15/2019 0349   TCO2 43 (H) 02/15/2019 0349   ACIDBASEDEF 2.0 02/08/2019 1444   O2SAT 95.0 02/15/2019 0349     Coagulation Profile: No results for input(s): INR, PROTIME in the last 168 hours.  Cardiac Enzymes: No results for input(s): CKTOTAL, CKMB, CKMBINDEX, TROPONINI in the last 168 hours.  HbA1C: Hgb A1c MFr Bld  Date/Time Value Ref Range Status  02/06/2019 06:54 AM 12.6 (H) 4.8 - 5.6 %  Final    Comment:    (NOTE) Pre diabetes:          5.7%-6.4% Diabetes:              >6.4% Glycemic control for   <7.0% adults with diabetes     CBG: Recent Labs  Lab 02/14/19 2039 02/14/19 2314 02/15/19 0304 02/15/19 0748 02/15/19 1135  GLUCAP 252* 198* 96 146* 192*    This patient is critically ill with multiple organ system failure; which, requires frequent high complexity decision making, assessment, support, evaluation, and titration of therapies. This was completed through the application of advanced monitoring technologies and extensive interpretation of multiple databases. During this encounter critical care time was devoted to patient care services described in this note for 34 minutes.  Garner Nash, DO [facility] 02/15/2019 1:33 PM

## 2019-02-15 NOTE — Progress Notes (Signed)
 Nutrition Follow-up  DOCUMENTATION CODES:   Not applicable  INTERVENTION:   Tube Feeding:  Continue Vital 1.5 at 50 ml/hr Pro-Stat 30 mL TID Provides 2100 kcals, 126 g of protein and 912 mL of free water   Additional free water: 250 mL q 8 hours: total 1662 mL   NUTRITION DIAGNOSIS:   Inadequate oral intake related to acute illness as evidenced by NPO status.  Being addressed via TF   GOAL:   Patient will meet greater than or equal to 90% of their needs  Progressing  MONITOR:   TF tolerance, Vent status, Labs, Weight trends  REASON FOR ASSESSMENT:   Consult, Ventilator Enteral/tube feeding initiation and management  ASSESSMENT:   54 yo male admitted with acute respiratory failure due to COVID-19 pneumonia, ARDS requiring intubation, shock. No PMH  1/23 Admit 1/24 Intubated 1/25 B/L chest tubes placed  No further proning  Patient is currently intubated on ventilator support, sedated on fentnayl, precedex MV: 7.8 L/min Temp (24hrs), Avg:98.7 F (37.1 C), Min:98.4 F (36.9 C), Max:99.1 F (37.3 C)  Vital 1.5 at 50 ml/hr, Pro-Stat 30 mL TID via OG tube, free water 250 mL q 8 hours  Current weight 83 kg; admit weight 80.3 kg  Labs: phosphorus 5.1, Creatinine wdl Meds: ss novolog, novolog q 4 hours, levemir BID, MVI, thiamine, zinc sulfate   Diet Order:   Diet Order            Diet NPO time specified  Diet effective now              EDUCATION NEEDS:   Not appropriate for education at this time  Skin:  Skin Assessment: Reviewed RN Assessment  Last BM:  1/29  Height:   Ht Readings from Last 1 Encounters:  02/06/19 5\' 6"  (1.676 m)    Weight:   Wt Readings from Last 1 Encounters:  02/15/19 83 kg   BMI:  Body mass index is 29.54 kg/m.  Estimated Nutritional Needs:   Kcal:  2220 kcals  Protein:  120-145 g  Fluid:  >/= 2 L    Shatara Stanek MS, RDN, LDN, CNSC [phone] Pager  [phone] Weekend/On-Call Pager

## 2019-02-15 NOTE — Progress Notes (Addendum)
RN called RT due to decrease in SAT. Patient had been weaning. SAT 94%. Placed patient back on full support. Attempted to leave PEEP at 5, but had to increase back to +12 and leave on 100%. Patient appears asynchronous with vent. RN to give sedation. Will attempt to wean PEEP and FIO2 as tolerated

## 2019-02-16 ENCOUNTER — Inpatient Hospital Stay (HOSPITAL_COMMUNITY): Payer: HRSA Program

## 2019-02-16 LAB — GLUCOSE, CAPILLARY
Glucose-Capillary: 213 mg/dL — ABNORMAL HIGH (ref 70–99)
Glucose-Capillary: 40 mg/dL — CL (ref 70–99)
Glucose-Capillary: 161 mg/dL — ABNORMAL HIGH (ref 70–99)
Glucose-Capillary: 210 mg/dL — ABNORMAL HIGH (ref 70–99)
Glucose-Capillary: 139 mg/dL — ABNORMAL HIGH (ref 70–99)
Glucose-Capillary: 170 mg/dL — ABNORMAL HIGH (ref 70–99)
Glucose-Capillary: 204 mg/dL — ABNORMAL HIGH (ref 70–99)

## 2019-02-16 LAB — BASIC METABOLIC PANEL
Anion gap: 11 (ref 5–15)
BUN: 49 mg/dL — ABNORMAL HIGH (ref 6–20)
CO2: 36 mmol/L — ABNORMAL HIGH (ref 22–32)
Calcium: 8.3 mg/dL — ABNORMAL LOW (ref 8.9–10.3)
Chloride: 89 mmol/L — ABNORMAL LOW (ref 98–111)
Creatinine, Ser: 1.03 mg/dL (ref 0.61–1.24)
GFR calc Af Amer: >60 mL/min (ref >60)
GFR calc non Af Amer: >60 mL/min (ref >60)
Glucose, Bld: 166 mg/dL — ABNORMAL HIGH (ref 70–99)
Potassium: 3.3 mmol/L — ABNORMAL LOW (ref 3.5–5.1)
Sodium: 136 mmol/L (ref 135–145)

## 2019-02-16 LAB — CBC
HCT: 36.4 % — ABNORMAL LOW (ref 39.0–52.0)
Hemoglobin: 11.7 g/dL — ABNORMAL LOW (ref 13.0–17.0)
MCH: 27.9 pg (ref 26.0–34.0)
MCHC: 32.1 g/dL (ref 30.0–36.0)
MCV: 86.9 fL (ref 80.0–100.0)
Platelets: 385 10*3/uL (ref 150–400)
RBC: 4.19 MIL/uL — ABNORMAL LOW (ref 4.22–5.81)
RDW: 13.8 % (ref 11.5–15.5)
WBC: 14.4 10*3/uL — ABNORMAL HIGH (ref 4.0–10.5)
nRBC: 0 % (ref 0.0–0.2)

## 2019-02-16 MED ORDER — DEXTROSE 50 % IV SOLN
1.0000 | Freq: Once | INTRAVENOUS | Status: AC
  Administered 2019-02-16: 50 mL via INTRAVENOUS
  Filled 2019-02-16: qty 50

## 2019-02-16 MED ORDER — SODIUM CHLORIDE 0.9 % IV BOLUS
500.0000 mL | Freq: Once | INTRAVENOUS | Status: AC
  Administered 2019-02-16: 500 mL via INTRAVENOUS

## 2019-02-16 MED ORDER — DEXMEDETOMIDINE HCL IN NACL 400 MCG/100ML IV SOLN
0.4000 ug/kg/h | INTRAVENOUS | Status: DC
  Administered 2019-02-16: 0.6 ug/kg/h via INTRAVENOUS
  Administered 2019-02-17: 0.7 ug/kg/h via INTRAVENOUS
  Administered 2019-02-17: 02:00:00 0.6 ug/kg/h via INTRAVENOUS
  Filled 2019-02-16 (×3): qty 100

## 2019-02-16 MED ORDER — ALBUMIN HUMAN 25 % IV SOLN
50.0000 g | Freq: Once | INTRAVENOUS | Status: AC
  Administered 2019-02-16: 50 g via INTRAVENOUS

## 2019-02-16 MED ORDER — FENTANYL CITRATE (PF) 100 MCG/2ML IJ SOLN
25.0000 ug | INTRAMUSCULAR | Status: DC | PRN
  Administered 2019-02-16 – 2019-02-24 (×10): 100 ug via INTRAVENOUS
  Administered 2019-02-25: 50 ug via INTRAVENOUS
  Administered 2019-02-25: 100 ug via INTRAVENOUS
  Administered 2019-02-26 – 2019-02-28 (×3): 50 ug via INTRAVENOUS
  Administered 2019-03-01: 100 ug via INTRAVENOUS
  Administered 2019-03-01 – 2019-03-03 (×2): 50 ug via INTRAVENOUS
  Administered 2019-03-03: 01:00:00 25 ug via INTRAVENOUS
  Administered 2019-03-04 (×2): 50 ug via INTRAVENOUS
  Filled 2019-02-16 (×4): qty 2

## 2019-02-16 MED ORDER — NOREPINEPHRINE 4 MG/250ML-% IV SOLN
0.0000 ug/min | INTRAVENOUS | Status: DC
  Administered 2019-02-16: 4 ug/min via INTRAVENOUS
  Administered 2019-02-17: 10 ug/min via INTRAVENOUS
  Administered 2019-02-17: 4.507 ug/min via INTRAVENOUS
  Administered 2019-02-18 – 2019-02-19 (×2): 5 ug/min via INTRAVENOUS
  Administered 2019-02-19: 4 ug/min via INTRAVENOUS
  Filled 2019-02-16 (×6): qty 250

## 2019-02-16 MED ORDER — INSULIN DETEMIR 100 UNIT/ML ~~LOC~~ SOLN
25.0000 [IU] | Freq: Every day | SUBCUTANEOUS | Status: DC
  Administered 2019-02-16 – 2019-02-18 (×3): 25 [IU] via SUBCUTANEOUS
  Filled 2019-02-16 (×4): qty 0.25

## 2019-02-16 MED ORDER — POTASSIUM CHLORIDE 10 MEQ/50ML IV SOLN
10.0000 meq | INTRAVENOUS | Status: AC
  Administered 2019-02-16 (×4): 10 meq via INTRAVENOUS
  Filled 2019-02-16 (×4): qty 50

## 2019-02-16 MED ORDER — FENTANYL 2500MCG IN NS 250ML (10MCG/ML) PREMIX INFUSION
25.0000 ug/h | INTRAVENOUS | Status: DC
  Administered 2019-02-16: 100 ug/h via INTRAVENOUS
  Administered 2019-02-17 – 2019-02-22 (×10): 200 ug/h via INTRAVENOUS
  Administered 2019-02-23: 13:00:00 100 ug/h via INTRAVENOUS
  Administered 2019-02-24 – 2019-02-25 (×4): 200 ug/h via INTRAVENOUS
  Administered 2019-02-26: 175 ug/h via INTRAVENOUS
  Administered 2019-02-27: 200 ug/h via INTRAVENOUS
  Administered 2019-02-27 – 2019-03-01 (×3): 100 ug/h via INTRAVENOUS
  Administered 2019-03-02: 150 ug/h via INTRAVENOUS
  Administered 2019-03-03: 01:00:00 50 ug/h via INTRAVENOUS
  Administered 2019-03-03: 22:00:00 100 ug/h via INTRAVENOUS
  Filled 2019-02-16 (×23): qty 250

## 2019-02-16 NOTE — Progress Notes (Signed)
Patient transported to CT and back without complications.  

## 2019-02-16 NOTE — Progress Notes (Signed)
eLink Physician-Brief Progress Note Patient Name: Andrew Bruce DOB: 29-Jan-1965 MRN: [id number]   Date of Service  02/16/2019  HPI/Events of Note  RN calling with concern for R sided weakness.  Moving LUE spontaneously. Verbal report he received and previous assessment charting incongruent. No clear reason for new R sided weakness. No hx stroke.   eICU Interventions  Stat non-contrast head CT     Intervention Category Minor Interventions: Clinical assessment - ordering diagnostic tests  Danford Bad 02/16/2019, 8:47 PM

## 2019-02-16 NOTE — Progress Notes (Signed)
eLink Physician-Brief Progress Note Patient Name: Andrew Bruce DOB: 11-Mar-1965 MRN: [id number]   Date of Service  02/16/2019  HPI/Events of Note  Hypotension - BP = 84/55 with MAP = 65. Patient give 0.9 NaCl 500 mL IV over 30 minutes.  eICU Interventions  Will order: 1. Bolus with an additional 500 mL IV over 30 minutes now.      Intervention Category Major Interventions: Hypotension - evaluation and management  Miquel Stacks [place] 02/16/2019, 2:15 AM

## 2019-02-16 NOTE — Progress Notes (Signed)
Pt became hypotensive. Systolic dropped to 60s. eLink notified. Per eLink nurse give 500cc bolus hold midnight lasix dose and monitor.   0923- Bolus completed. Pt BP has increased. Will continue to monitor throughout the night.

## 2019-02-16 NOTE — Progress Notes (Signed)
Assisted tele visit to patient with daughter.  Jessicamarie Amiri Samson, RN  

## 2019-02-16 NOTE — Progress Notes (Signed)
NAME:  Andrew Bruce, MRN:  [id number], DOB:  [birth year], LOS: 10 ADMISSION DATE:  02/06/2019, CONSULTATION DATE:  02/06/19 REFERRING MD:  ER, CHIEF COMPLAINT:  SOB   Brief History   This is a 54 year old man without a past medical history who presents with worsening shortness of breath over the past 2 days in the context of a 10-day Covid illness.  Chest x-ray and vitals are consistent with a worsening Covid ARDS.  History of present illness   This is a 54 year old man without a past medical history who presents with worsening shortness of breath over the past 2 days in the context of a 10-day Covid illness.  Chest x-ray and vitals are consistent with a worsening Covid ARDS.  History performed with Spanish interpreter as patient is Spanish-speaking only.  He states he was feeling fatigued but stable since an ER visit last week for Covid.  Over the past 2 days he has noticed a dramatic increase in his shortness of breath.  He is dyspneic even speaking.  He was placed on a nonrebreather and nasal cannula in the ER and is still mildly tachypneic with sats in the 80s.  He denies chest pain, productive cough.  He does have a dry cough with inspiration that is paroxysmal.  He denies any leg swelling.  He has lost his sense of taste and smell.  PCCM consulted for admission.  Patient has never been hospitalized for.  He has no chronic medical issues and is on no home medications.  Past Medical History  None  Significant Hospital Events   02/06/2019 admitted  Consults:  None  Procedures:  Intubated 1/24 Bilateral chest tube placement 1/25  Significant Diagnostic Tests:  Chest x-ray with an ARDS pattern  Micro Data:  Covid positive Pct Neg  Antimicrobials:  Remdesivir 02/06/19>>1/28 Dexamethasone 02/06/19>>2/2   Interim history/subjective:  Patient remains intubated on mechanical ventilation critically ill with in the intensive care unit.  Patient tolerating SBT this morning.  Sedation has  been lightened.  Objective   Blood pressure (!) 90/57, pulse 71, temperature 98.3 F (36.8 C), temperature source Axillary, resp. rate 15, height 5\' 6"  (1.676 m), weight 83 kg, SpO2 100 %.    Vent Mode: PCV FiO2 (%):  [40 %-100 %] 80 % Set Rate:  [15 bmp] 15 bmp PEEP:  [5 cmH20-12 cmH20] 12 cmH20 Pressure Support:  [5 cmH20] 5 cmH20 Plateau Pressure:  [22 cmH20-24 cmH20] 24 cmH20   Intake/Output Summary (Last 24 hours) at 02/16/2019 04/16/2019 Last data filed at 02/16/2019 0725 Gross per 24 hour  Intake 2819.95 ml  Output 3250 ml  Net -430.05 ml   Filed Weights   02/13/19 0500 02/14/19 0135 02/15/19 0306  Weight: 89.3 kg 83.6 kg 83 kg   Examination: General appearance: 54 y.o., male, intubated on mechanical ventilation Eyes: More sedate this morning.,  Pupils reactive HENT: NCAT, mucous membranes moist Neck: Endotracheal tube in place Lungs: Bilateral ventilated breath sounds CV: Regular rate and rhythm, S1-S2 Abdomen: Soft, nontender nondistended Extremities: No significant edema Psych: Dated on mechanical ventilation Neuro: Moves upper and lower extremities spontaneously   Chest x-ray reviewed.  Bilateral chest tubes in place.  Bilateral infiltrates.  Comparing the chest x-rays through several series does appear that his most recent is somewhat improved. The patient's images have been independently reviewed by me.    Resolved Hospital Problem list   N/A  Assessment & Plan:   Acute hypoxemic respiratory failure due to COVID-19 pneumonia, ARDS, bilateral pneumothorax  status post bilateral chest tube placement 02/08/2019 Continue mechanical ventilation per ARDS protocol Target TVol 6-8cc/kgIBW Target Plateau Pressure < 30cm H20 Target driving pressure less than 15 cm of water Target PaO2 55-65: titrate PEEP/FiO2 per protocol No additional plans for proning at this time Check CVP daily if CVL in place Target CVP less than 4, diurese as necessary Ventilator associated pneumonia  prevention protocol Holding additional diuresis at this time.  Hypotension -I suspect it was slightly from overdiuresis. -Gave some volume back through bolus. -Additionally given doses of albumin today. -Blood pressure is seemingly leveled out.  COVID-19 pneumonia/ARDS Complete 5 days remdesivir, 10 days dexamethasone Lovenox 0.5 mg/kg DVT PPx  Steroid-induced hyperglycemia Levemir plus SSI  At risk malnutrition Continue tube feeds at goal  Oliguria, hypernatremia- Free water replacement continue  Best practice:  Diet: NPO, TF Pain/Anxiety/Delirium protocol (if indicated): See above VAP protocol (if indicated): Ordered DVT prophylaxis: 0.5mg /kg BID Lovenox GI prophylaxis: Pepcid Glucose control: SSI and Levemir Mobility: Bedrest Code Status: Full Family Communication: discussed with staff Disposition: ICU  Labs   CBC: Recent Labs  Lab 02/10/19 0401 02/10/19 1130 02/11/19 0451 02/11/19 0451 02/12/19 9924 02/12/19 2683 02/13/19 4196 02/13/19 2023 02/14/19 0428 02/14/19 1421 02/15/19 0349 02/15/19 0530 02/16/19 0421  WBC 7.9  --  6.1   < > 8.1  --  10.5  --  12.1*  --   --  12.6* 14.4*  NEUTROABS 6.8  --  4.8  --  6.2  --   --   --   --   --   --   --   --   HGB 10.7*   < > 10.0*   < > 11.3*   < > 10.4*   < > 11.1* 13.9 13.3 12.6* 11.7*  HCT 34.9*   < > 32.9*   < > 37.2*   < > 33.4*   < > 35.3* 41.0 39.0 40.2 36.4*  MCV 90.6  --  90.9   < > 91.0  --  88.8  --  90.1  --   --  88.9 86.9  PLT 274  --  278   < > 273  --  267  --  326  --   --  425* 385   < > = values in this interval not displayed.    Basic Metabolic Panel: Recent Labs  Lab 02/09/19 1600 02/10/19 0401 02/11/19 0451 02/11/19 0451 02/12/19 2229 02/12/19 7989 02/13/19 0551 02/13/19 2023 02/14/19 0428 02/14/19 1421 02/15/19 0349 02/15/19 0530 02/16/19 0421  NA  --    < > 148*   < > 145   < > 133*   < > 142 135 136 138 136  K  --    < > 4.4   < > 3.9   < > 3.8   < > 3.8 4.1 3.9 3.9  3.3*  CL  --    < > 107   < > 108  --  97*  --  100  --   --  90* 89*  CO2  --    < > 29   < > 30  --  29  --  31  --   --  38* 36*  GLUCOSE  --    < > 213*   < > 110*  --  209*  --  149*  --   --  108* 166*  BUN  --    < > 26*   < >  28*  --  20  --  20  --   --  25* 49*  CREATININE  --    < > 0.67   < > 0.61  --  0.65  --  0.60*  --   --  0.66 1.03  CALCIUM  --    < > 8.2*   < > 8.1*  --  7.5*  --  8.3*  --   --  8.8* 8.3*  MG 2.5*  --  2.7*  --  2.7*  --  1.9  --  2.2  --   --  2.5*  --   PHOS 2.5  --   --   --   --   --   --   --   --   --   --  5.1*  --    < > = values in this interval not displayed.   GFR: Estimated Creatinine Clearance: 84.8 mL/min (by C-G formula based on SCr of 1.03 mg/dL). Recent Labs  Lab 02/13/19 0551 02/14/19 0428 02/15/19 0530 02/16/19 0421  WBC 10.5 12.1* 12.6* 14.4*    Liver Function Tests: Recent Labs  Lab 02/10/19 0401 02/11/19 0451 02/12/19 0623  AST 15 20 21   ALT 26 25 28   ALKPHOS 58 53 63  BILITOT 0.3 0.8 0.4  PROT 5.2* 5.2* 5.4*  ALBUMIN 2.6* 2.6* 2.6*   No results for input(s): LIPASE, AMYLASE in the last 168 hours. No results for input(s): AMMONIA in the last 168 hours.  ABG    Component Value Date/Time   PHART 7.496 (H) 02/15/2019 0349   PCO2ART 53.3 (H) 02/15/2019 0349   PO2ART 74.0 (L) 02/15/2019 0349   HCO3 41.2 (H) 02/15/2019 0349   TCO2 43 (H) 02/15/2019 0349   ACIDBASEDEF 2.0 02/08/2019 1444   O2SAT 95.0 02/15/2019 0349     Coagulation Profile: No results for input(s): INR, PROTIME in the last 168 hours.  Cardiac Enzymes: No results for input(s): CKTOTAL, CKMB, CKMBINDEX, TROPONINI in the last 168 hours.  HbA1C: Hgb A1c MFr Bld  Date/Time Value Ref Range Status  02/06/2019 06:54 AM 12.6 (H) 4.8 - 5.6 % Final    Comment:    (NOTE) Pre diabetes:          5.7%-6.4% Diabetes:              >6.4% Glycemic control for   <7.0% adults with diabetes     CBG: Recent Labs  Lab 02/15/19 1922 02/15/19 2313  02/16/19 0344 02/16/19 0749 02/16/19 0752  GLUCAP 181* 247* 213* 38* 40*    This patient is critically ill with multiple organ system failure; which, requires frequent high complexity decision making, assessment, support, evaluation, and titration of therapies. This was completed through the application of advanced monitoring technologies and extensive interpretation of multiple databases. During this encounter critical care time was devoted to patient care services described in this note for 34 minutes.  Garner Nash, DO [facility] 02/16/2019 8:20 AM

## 2019-02-16 NOTE — Progress Notes (Signed)
eLink Physician-Brief Progress Note Patient Name: Andrew Bruce DOB: 06/25/1965 MRN: [id number]   Date of Service  02/16/2019  HPI/Events of Note  K+ = 3.3 and Creatinine = 1.03.   eICU Interventions  Will replace K+.      Intervention Category Major Interventions: Electrolyte abnormality - evaluation and management  Jhoana Upham Eugene 02/16/2019, 6:43 AM

## 2019-02-17 ENCOUNTER — Inpatient Hospital Stay (HOSPITAL_COMMUNITY): Payer: HRSA Program

## 2019-02-17 ENCOUNTER — Inpatient Hospital Stay (HOSPITAL_COMMUNITY): Payer: Self-pay

## 2019-02-17 DIAGNOSIS — I6389 Other cerebral infarction: Secondary | ICD-10-CM

## 2019-02-17 DIAGNOSIS — I63413 Cerebral infarction due to embolism of bilateral middle cerebral arteries: Secondary | ICD-10-CM

## 2019-02-17 DIAGNOSIS — I6340 Cerebral infarction due to embolism of unspecified cerebral artery: Secondary | ICD-10-CM | POA: Insufficient documentation

## 2019-02-17 LAB — GLUCOSE, CAPILLARY
Glucose-Capillary: 222 mg/dL — ABNORMAL HIGH (ref 70–99)
Glucose-Capillary: 190 mg/dL — ABNORMAL HIGH (ref 70–99)
Glucose-Capillary: 171 mg/dL — ABNORMAL HIGH (ref 70–99)
Glucose-Capillary: 309 mg/dL — ABNORMAL HIGH (ref 70–99)
Glucose-Capillary: 322 mg/dL — ABNORMAL HIGH (ref 70–99)
Glucose-Capillary: 330 mg/dL — ABNORMAL HIGH (ref 70–99)
Glucose-Capillary: 210 mg/dL — ABNORMAL HIGH (ref 70–99)

## 2019-02-17 LAB — CBC
HCT: 37 % — ABNORMAL LOW (ref 39.0–52.0)
Hemoglobin: 11.7 g/dL — ABNORMAL LOW (ref 13.0–17.0)
MCH: 27.8 pg (ref 26.0–34.0)
MCHC: 31.6 g/dL (ref 30.0–36.0)
MCV: 87.9 fL (ref 80.0–100.0)
Platelets: 420 10*3/uL — ABNORMAL HIGH (ref 150–400)
RBC: 4.21 MIL/uL — ABNORMAL LOW (ref 4.22–5.81)
RDW: 13.8 % (ref 11.5–15.5)
WBC: 18.9 10*3/uL — ABNORMAL HIGH (ref 4.0–10.5)
nRBC: 0 % (ref 0.0–0.2)

## 2019-02-17 LAB — BASIC METABOLIC PANEL
Anion gap: 8 (ref 5–15)
BUN: 31 mg/dL — ABNORMAL HIGH (ref 6–20)
CO2: 31 mmol/L (ref 22–32)
Calcium: 8.4 mg/dL — ABNORMAL LOW (ref 8.9–10.3)
Chloride: 93 mmol/L — ABNORMAL LOW (ref 98–111)
Creatinine, Ser: 0.68 mg/dL (ref 0.61–1.24)
GFR calc Af Amer: >60 mL/min (ref >60)
GFR calc non Af Amer: >60 mL/min (ref >60)
Glucose, Bld: 235 mg/dL — ABNORMAL HIGH (ref 70–99)
Potassium: 3.6 mmol/L (ref 3.5–5.1)
Sodium: 132 mmol/L — ABNORMAL LOW (ref 135–145)

## 2019-02-17 LAB — ECHOCARDIOGRAM LIMITED
Height: 66 in
Weight: 2694.9 oz

## 2019-02-17 LAB — HEMOGLOBIN A1C
Hgb A1c MFr Bld: 11.7 % — ABNORMAL HIGH (ref 4.8–5.6)
Mean Plasma Glucose: 289.09 mg/dL

## 2019-02-17 LAB — TRIGLYCERIDES: Triglycerides: 134 mg/dL (ref <150)

## 2019-02-17 MED ORDER — ROCURONIUM BROMIDE 10 MG/ML (PF) SYRINGE
PREFILLED_SYRINGE | INTRAVENOUS | Status: AC
  Filled 2019-02-17: qty 10

## 2019-02-17 MED ORDER — MIDAZOLAM 50MG/50ML (1MG/ML) PREMIX INFUSION
0.0000 mg/h | INTRAVENOUS | Status: DC
  Administered 2019-02-17: 5 mg/h via INTRAVENOUS
  Filled 2019-02-17: qty 50

## 2019-02-17 MED ORDER — ROCURONIUM BROMIDE 10 MG/ML (PF) SYRINGE
PREFILLED_SYRINGE | INTRAVENOUS | Status: AC
  Administered 2019-02-17: 60 mg
  Filled 2019-02-17: qty 10

## 2019-02-17 MED ORDER — PROPOFOL 1000 MG/100ML IV EMUL
5.0000 ug/kg/min | INTRAVENOUS | Status: DC
  Administered 2019-02-17 (×2): 10 ug/kg/min via INTRAVENOUS
  Administered 2019-02-18: 21:00:00 50 ug/kg/min via INTRAVENOUS
  Administered 2019-02-18 (×2): 35 ug/kg/min via INTRAVENOUS
  Administered 2019-02-18: 40 ug/kg/min via INTRAVENOUS
  Administered 2019-02-19 (×3): 50 ug/kg/min via INTRAVENOUS
  Administered 2019-02-19: 40 ug/kg/min via INTRAVENOUS
  Filled 2019-02-17 (×10): qty 100

## 2019-02-17 MED ORDER — TAMSULOSIN HCL 0.4 MG PO CAPS
0.4000 mg | ORAL_CAPSULE | Freq: Every day | ORAL | Status: DC
  Administered 2019-02-17 – 2019-02-25 (×9): 0.4 mg via ORAL
  Filled 2019-02-17 (×9): qty 1

## 2019-02-17 MED ORDER — ROCURONIUM BROMIDE 50 MG/5ML IV SOLN
60.0000 mg | Freq: Once | INTRAVENOUS | Status: AC
  Administered 2019-02-17: 60 mg via INTRAVENOUS
  Filled 2019-02-17: qty 6

## 2019-02-17 MED ORDER — ASPIRIN 300 MG RE SUPP
300.0000 mg | Freq: Every day | RECTAL | Status: DC
  Filled 2019-02-17 (×2): qty 1

## 2019-02-17 MED ORDER — STROKE: EARLY STAGES OF RECOVERY BOOK
Freq: Once | Status: AC
  Filled 2019-02-17: qty 1

## 2019-02-17 MED ORDER — INSULIN ASPART 100 UNIT/ML ~~LOC~~ SOLN
2.0000 [IU] | SUBCUTANEOUS | Status: DC
  Administered 2019-02-17 – 2019-02-20 (×18): 2 [IU] via SUBCUTANEOUS

## 2019-02-17 MED ORDER — ARTIFICIAL TEARS OPHTHALMIC OINT
1.0000 "application " | TOPICAL_OINTMENT | Freq: Three times a day (TID) | OPHTHALMIC | Status: DC
  Administered 2019-02-17 – 2019-02-25 (×22): 1 via OPHTHALMIC
  Filled 2019-02-17: qty 3.5

## 2019-02-17 MED ORDER — ASPIRIN 325 MG PO TABS
325.0000 mg | ORAL_TABLET | Freq: Every day | ORAL | Status: DC
  Administered 2019-02-17 – 2019-02-22 (×6): 325 mg via ORAL
  Filled 2019-02-17 (×6): qty 1

## 2019-02-17 MED ORDER — IOHEXOL 350 MG/ML SOLN
100.0000 mL | Freq: Once | INTRAVENOUS | Status: AC | PRN
  Administered 2019-02-17: 100 mL via INTRAVENOUS

## 2019-02-17 MED ORDER — ENOXAPARIN SODIUM 80 MG/0.8ML ~~LOC~~ SOLN
1.0000 mg/kg | Freq: Two times a day (BID) | SUBCUTANEOUS | Status: DC
  Administered 2019-02-17 – 2019-02-28 (×23): 75 mg via SUBCUTANEOUS
  Filled 2019-02-17: qty 0.8
  Filled 2019-02-17: qty 0.75
  Filled 2019-02-17 (×2): qty 0.8
  Filled 2019-02-17: qty 0.75
  Filled 2019-02-17 (×3): qty 0.8
  Filled 2019-02-17: qty 0.75
  Filled 2019-02-17: qty 0.8
  Filled 2019-02-17: qty 0.75
  Filled 2019-02-17: qty 0.8
  Filled 2019-02-17: qty 0.75
  Filled 2019-02-17 (×2): qty 0.8
  Filled 2019-02-17: qty 0.75
  Filled 2019-02-17: qty 0.8
  Filled 2019-02-17: qty 0.75
  Filled 2019-02-17: qty 0.8
  Filled 2019-02-17: qty 0.75
  Filled 2019-02-17: qty 0.8
  Filled 2019-02-17: qty 0.75
  Filled 2019-02-17 (×2): qty 0.8
  Filled 2019-02-17 (×2): qty 0.75
  Filled 2019-02-17: qty 0.8

## 2019-02-17 MED ORDER — SODIUM CHLORIDE 0.9 % IV SOLN
0.5000 ug/kg/min | INTRAVENOUS | Status: DC
  Administered 2019-02-17: 2.5 ug/kg/min via INTRAVENOUS
  Administered 2019-02-17: 11:00:00 3 ug/kg/min via INTRAVENOUS
  Administered 2019-02-18 (×2): 4 ug/kg/min via INTRAVENOUS
  Administered 2019-02-19: 5 ug/kg/min via INTRAVENOUS
  Filled 2019-02-17 (×7): qty 20

## 2019-02-17 MED ORDER — ROCURONIUM BROMIDE 50 MG/5ML IV SOLN
60.0000 mg | INTRAVENOUS | Status: AC
  Filled 2019-02-17: qty 6

## 2019-02-17 NOTE — Progress Notes (Signed)
  Echocardiogram 2D Echocardiogram has been performed.  Andrew Bruce 02/17/2019, 11:21 AM

## 2019-02-17 NOTE — Progress Notes (Signed)
Pt is intubated, sedated, and paralyzed. See MAR. On 100% FiO2 with PEEP 15. Pt is unable to tolerate movement at this time. MD aware. Per MD- do not transport to MRI at this time. Charge RN aware. Will continue to monitor.

## 2019-02-17 NOTE — Progress Notes (Signed)
Pt returned from CT at this time.  

## 2019-02-17 NOTE — Progress Notes (Signed)
SLP Cancellation Note  Patient Details Name: Andrew Bruce MRN: [id number] DOB: 07-11-1965   Cancelled treatment:       Reason Eval/Treat Not Completed: Medical issues which prohibited therapy. Unable to complete SLE at this time, as pt is currently orally intubated. Will continue efforts with pt improvement and appropriateness for skilled ST intervention.  Merrillyn Ackerley B. Murvin Natal, [facility], CCC-SLP Speech Language Pathologist Office: [phone] Pager: [phone]  Leigh Aurora 02/17/2019, 3:18 PM

## 2019-02-17 NOTE — Consult Note (Signed)
Neurology Consultation Reason for Consult: Stroke Referring Physician: Laural Benes  CC: Stroke  History is obtained from: Chart  HPI: [name] is a 54 y.o. male with no past medical history who presented with shortness of breath on 1/23 due to Covid.  Symptoms initially started around the 13th.  He was started on remdesivir and dexamethasone as well as full dose anticoagulation.  He was intubated on the 25th.  On the 27th, anticoagulation was decreased 2.5 mix per cake twice daily due to recent trial suggesting harm.  He continued to have vent dyssynchrony and continued to need significant sedation for ventilator synchrony and proning.   As sedation has been weaned today, it was noted that he was not moving his right side as well and therefore head CT was performed.  This demonstrated subacute infarcts in the left.   LKW: Unclear tpa given?: no, unclear time of onset    ROS:  Unable to obtain due to altered mental status.   History reviewed. No pertinent past medical history.   Family history: Unable to obtain due to altered mental status.   Social History:  reports that he has never smoked. He has never used smokeless tobacco. He reports current alcohol use. He reports that he does not use drugs.   Exam: Current vital signs: BP 113/71   Pulse 71   Temp 99.2 F (37.3 C) (Oral)   Resp 18   Ht 5\' 6"  (1.676 m)   Wt 76.4 kg   SpO2 95%   BMI 27.19 kg/m  Vital signs in last 24 hours: Temp:  [97.9 F (36.6 C)-102.4 F (39.1 C)] 99.2 F (37.3 C) (02/03 0000) Pulse Rate:  [68-133] 71 (02/03 0225) Resp:  [15-41] 18 (02/03 0225) BP: (66-132)/(31-84) 113/71 (02/03 0225) SpO2:  [86 %-100 %] 95 % (02/03 0225) FiO2 (%):  [50 %-80 %] 50 % (02/03 0225) Weight:  [76.4 kg] 76.4 kg (02/03 0000)   Physical Exam  Constitutional: Appears well-developed and well-nourished.  Psych: Does not respond Eyes: No scleral injection HENT: Intubated MSK: no joint deformities.   Cardiovascular: Normal rate and regular rhythm.  Respiratory: Ventilated GI: Soft.  No distension. There is no tenderness.  Skin: WDI  Neuro: He is on sedation with Precedex/fentanyl which does limit the exam to some degree. Mental Status: Patient does not open eyes or follow commands, however he does grimace to noxious stimulation Cranial Nerves: II: He does not blink to threat pupils are equal, round, and reactive to light.   III,IV, VI: Eyes are slightly disconjugate, hourly deviated compared to the other. V: VII: Corneals weak on the right, normal on left X: Cough intact Motor: He minimally withdraws the left arm to noxious stimulation, slightly more withdrawn the left leg, minimal withdrawal in the right leg and no movement in the right arm. sensory: As above Cerebellar: Does not perform     I have reviewed labs in epic and the results pertinent to this consultation are: Creatinine 1.03  I have reviewed the images obtained: CT head-multifocal ischemic infarcts including left parieto-occipital region as well as left frontal region, also right frontal.  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  He will need further evaluation for this.  Recommendations: - HgbA1c, fasting lipid panel - MRI   of the brain without contrast - Frequent neuro checks - Echocardiogram - CTA head neck - Prophylactic therapy-Antiplatelet med: Aspirin - dose 325mg  PO or 300mg  PR - Continue prophylactic  dose Lovenox - Risk factor modification - Telemetry monitoring - PT consult, OT consult, Speech consult - Stroke team to follow    Roland Rack, MD [facility] [phone]  If 7pm- 7am, please page neurology on call as listed in [place].

## 2019-02-17 NOTE — Progress Notes (Signed)
Per MD- titrate pressors for MAP of 65.

## 2019-02-17 NOTE — Plan of Care (Signed)
  Problem: Activity: Goal: Ability to tolerate increased activity will improve Outcome: Progressing   Problem: Respiratory: Goal: Ability to maintain a clear airway and adequate ventilation will improve Outcome: Progressing   Problem: Role Relationship: Goal: Method of communication will improve Outcome: Progressing   Problem: Education: Goal: Knowledge of General Education information will improve Description: Including pain rating scale, medication(s)/side effects and non-pharmacologic comfort measures Outcome: Progressing   Problem: Health Behavior/Discharge Planning: Goal: Ability to manage health-related needs will improve Outcome: Progressing   Problem: Clinical Measurements: Goal: Ability to maintain clinical measurements within normal limits will improve Outcome: Progressing Goal: Will remain free from infection Outcome: Progressing Goal: Respiratory complications will improve Outcome: Progressing Goal: Cardiovascular complication will be avoided Outcome: Progressing   Problem: Nutrition: Goal: Adequate nutrition will be maintained Outcome: Progressing   Problem: Coping: Goal: Level of anxiety will decrease Outcome: Progressing   Problem: Elimination: Goal: Will not experience complications related to bowel motility Outcome: Progressing Goal: Will not experience complications related to urinary retention Outcome: Progressing   Problem: Pain Managment: Goal: General experience of comfort will improve Outcome: Progressing   Problem: Safety: Goal: Ability to remain free from injury will improve Outcome: Progressing   Problem: Skin Integrity: Goal: Risk for impaired skin integrity will decrease Outcome: Progressing   Problem: Clinical Measurements: Goal: Diagnostic test results will improve Outcome: Not Progressing   Problem: Activity: Goal: Risk for activity intolerance will decrease Outcome: Not Progressing   Problem: Education: Goal: Knowledge  of secondary prevention will improve 02/17/2019 1627 by Garner Nash, RN Outcome: Not Progressing 02/17/2019 1625 by Garner Nash, RN Outcome: Progressing Goal: Knowledge of patient specific risk factors addressed and post discharge goals established will improve 02/17/2019 1627 by Garner Nash, RN Outcome: Not Progressing 02/17/2019 1625 by Garner Nash, RN Outcome: Progressing Goal: Individualized Educational Video(s) 02/17/2019 1627 by Garner Nash, RN Outcome: Not Progressing 02/17/2019 1625 by Garner Nash, RN Outcome: Progressing   Problem: Self-Care: Goal: Verbalization of feelings and concerns over difficulty with self-care will improve 02/17/2019 1627 by Garner Nash, RN Outcome: Not Progressing 02/17/2019 1625 by Garner Nash, RN Outcome: Progressing Goal: Ability to communicate needs accurately will improve 02/17/2019 1627 by Garner Nash, RN Outcome: Not Progressing 02/17/2019 1625 by Garner Nash, RN Outcome: Progressing

## 2019-02-17 NOTE — Progress Notes (Signed)
Spoke with Dr. Gwenyth Bender with [facility] radiology at 19:22. MD states pt has left lower lobe pulmonary embolus. Reported to Dr. Katrinka Blazing, MD at this time.

## 2019-02-17 NOTE — Progress Notes (Signed)
[employer] Services contacted. Interp #[id number] Adrianna used for spanish interpretation.

## 2019-02-17 NOTE — Progress Notes (Signed)
STROKE TEAM PROGRESS NOTE   INTERVAL HISTORY I have personally reviewed history of presenting illness, electronic medical records and imaging films in PACS.  Patient developed was refractory respiratory failure and hypoxia and was fighting the ventilator and has chest pain paralyzed and intubated by critical care team.  Vitals:   02/17/19 1040 02/17/19 1050 02/17/19 1200 02/17/19 1317  BP: 99/70   124/81  Pulse: (!) 102 (!) 104  (!) 116  Resp: (!) 30 (!) 30  (!) 30  Temp:   98.8 F (37.1 C)   TempSrc:   Axillary   SpO2: 93% 94%  90%  Weight:      Height:        CBC:  Recent Labs  Lab 02/11/19 0451 02/11/19 0451 02/12/19 0623 02/13/19 0551 02/16/19 0421 02/17/19 0424  WBC 6.1   < > 8.1   < > 14.4* 18.9*  NEUTROABS 4.8  --  6.2  --   --   --   HGB 10.0*   < > 11.3*   < > 11.7* 11.7*  HCT 32.9*   < > 37.2*   < > 36.4* 37.0*  MCV 90.9   < > 91.0   < > 86.9 87.9  PLT 278   < > 273   < > 385 420*   < > = values in this interval not displayed.    Basic Metabolic Panel:  Recent Labs  Lab 02/14/19 0428 02/14/19 1421 02/15/19 0530 02/15/19 0530 02/16/19 0421 02/17/19 0424  NA 142   < > 138   < > 136 132*  K 3.8   < > 3.9   < > 3.3* 3.6  CL 100  --  90*   < > 89* 93*  CO2 31  --  38*   < > 36* 31  GLUCOSE 149*  --  108*   < > 166* 235*  BUN 20  --  25*   < > 49* 31*  CREATININE 0.60*  --  0.66   < > 1.03 0.68  CALCIUM 8.3*  --  8.8*   < > 8.3* 8.4*  MG 2.2  --  2.5*  --   --   --   PHOS  --   --  5.1*  --   --   --    < > = values in this interval not displayed.   Lipid Panel:     Component Value Date/Time   CHOL 134 02/17/2019 0424   TRIG 134 02/17/2019 1222   HDL 33 (L) 02/17/2019 0424   CHOLHDL 4.1 02/17/2019 0424   VLDL 26 02/17/2019 0424   LDLCALC NOT CALCULATED 02/17/2019 0424   HgbA1c:  Lab Results  Component Value Date   HGBA1C 11.7 (H) 02/17/2019   IMAGING past 48 hours CT HEAD WO CONTRAST  Result Date: 02/16/2019 CLINICAL DATA:  54 year old  male with focal neurologic deficit. Recent COVID diagnosis. EXAM: CT HEAD WITHOUT CONTRAST TECHNIQUE: Contiguous axial images were obtained from the base of the skull through the vertex without intravenous contrast. COMPARISON:  None. FINDINGS: Brain: The ventricles and sulci appropriate size for patient's age. There is an area of hypodensity in the left occipital lobe most consistent with edema. A smaller focal area of hypodensity noted in the right frontal convexity (series 3 images 25 and 26). There is a small focus of calcification in the right frontal lobe, likely sequela of prior insult. There is no acute intracranial hemorrhage. No mass effect or midline  shift. No extra-axial fluid collection. Vascular: No hyperdense vessel or unexpected calcification. Skull: Normal. Negative for fracture or focal lesion. Sinuses/Orbits: Partial opacification of the left maxillary sinus. The remainder of the visualized paranasal sinuses are clear. No air-fluid level. There is bilateral mastoid effusions. Other: None IMPRESSION: 1. No acute intracranial hemorrhage. 2. Large area of infarct in the left occipital lobe, likely subacute. Clinical correlation is recommended. Additional smaller right frontal convexity infarct also noted. MRI may provide better evaluation if clinically indicated. These results were called by telephone at the time of interpretation on 02/16/2019 at 11:22 pm to nurse Rolland Porter, who verbally acknowledged these results. Electronically Signed   By: Anner Crete M.D.   On: 02/16/2019 23:28   DG CHEST PORT 1 VIEW  Result Date: 02/17/2019 CLINICAL DATA:  COVID pneumonia EXAM: PORTABLE CHEST 1 VIEW COMPARISON:  02/15/2019 FINDINGS: The endotracheal tube is 4 cm above the carina. The NG tube is stable. Stable bilateral chest tubes. No pneumothorax. Persistent bilateral interstitial infiltrates. No definite pleural effusions. IMPRESSION: 1. Stable support apparatus. 2. Persistent interstitial infiltrates.  Electronically Signed   By: Marijo Sanes M.D.   On: 02/17/2019 08:31    PHYSICAL EXAM Frail malnourished looking middle-aged male who is sedated intubated and paralyzed. . Afebrile. Head is nontraumatic. Neck is supple without bruit.    Cardiac exam no murmur or gallop. Lungs are clear to auscultation. Distal pulses are well felt. Neurological Exam :  Patient is sedated intubated paralyzed.  Eyes are closed.  Pupils 3 mm sluggishly reactive.  Doll's eye movement of sluggish.  Fundi not visualized.  Eyes are disconjugate with slight right eye hypertropia.  Face is symmetric.  Tongue midline.  Motor system exam no response to noxious stimuli including sternal rub and nailbed pressure.  Tone is diminished bilaterally.  Plantars are not elicitable bilaterally. ASSESSMENT/PLAN Mr. Stepehn Eckard is a 54 y.o. male with no significant past medical history admitted to the hospital 1/23 w/ SOB d/t COVID treated with remdesivir, dexamethasone and full dose anticoagulation. Intubated on the 25th. On the 27th, AC was decreased. Continued to need significant sedation for vent synchrony and and proning. Once sedation lifted, noted to not be moving his R side. CT with L brain infarcts.   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 Possible R frontal convexity infarct.   CTA head & neck pending   MRI  pending   Lower extremity Doppler  pending   Transcranial Doppler pending    2D Echo pending   LDL NOT CALCULATED (Cho 134, HDL 33)  HgbA1c 11.7  Lovenox 40 mg sq q 12h for VTE prophylaxis  No antithrombotic prior to admission, now on aspirin 300 mg suppository daily.   Therapy recommendations:  pending   Disposition:  pending   Acuate hypoxemic respiratory failure d/t COVID-19 PNA, ARDS, bilateral pneumothorax s/p CT placement  Intubated  Ventilator synchrony issues, paralytics prn  Possible  trach  D-dimer 1.2 (1/28)  Blood Pressure  Variable - 66/31 - 212/118 in past 24h . Permissive hypertension (OK if < 220/120) from stroke standpoint, gradually normalize in 5-7 days . Long-term BP goal normotensive  Hyperlipidemia  Home meds:  No statin  LDL NOT CALCULATED (Cho 134, HDL 33), goal < 70  Check direct LDL pending   Steroid Induced Hyperglycemia  HgbA1c 11.7, goal < 7.0  Dysphagia At Risk Malnutrition . Secondary to  stroke . NPO . TF   Other Stroke Risk Factors  ETOH use, advised to drink no more than 2 drink(s) a day  Overweight, Body mass index is 27.19 kg/m., recommend weight loss, diet and exercise as appropriate   Hospital day # 11  I have personally obtained history,examined this patient, reviewed notes, independently viewed imaging studies, participated in medical decision making and plan of care.ROS completed by me personally and pertinent positives fully documented  I have made any additions or clarifications directly to the above note. . Patient likely has multiple embolic strokes related to Covid related hypercoagulability though cardiac sources are also possible.  Patient's medical condition has declined significantly with refractory respiratory failure hence I do not believe further ongoing stroke work-up is indicated till his condition stabilizes.  We will hold off on MRI scan and checking lower extremity Dopplers and transcranial Doppler bubble study.  Discussed with Dr. Levon Hedger critical care MD.  No family available at the bedside.  Continue aspirin for now unless clear-cut evidence of DVT or pulmonary embolism is obtained This patient is critically ill and at significant risk of neurological worsening, death and care requires constant monitoring of vital signs, hemodynamics,respiratory and cardiac monitoring, extensive review of multiple databases, frequent neurological assessment, discussion with family, other specialists and medical decision  making of high complexity.I have made any additions or clarifications directly to the above note.This critical care time does not reflect procedure time, or teaching time or supervisory time of PA/NP/Med Resident etc but could involve care discussion time.  I spent 30 minutes of neurocritical care time  in the care of  this patient.      Delia Heady, MD Medical Director [facility] Pager: [phone] 02/17/2019 4:16 PM   To contact Stroke Continuity provider, please refer to [email or web address]. After hours, contact General Neurology

## 2019-02-17 NOTE — Progress Notes (Signed)
Spoke with daughters over phone.  We discussed echo findings of acute right heart failure and that this is either worsening ARDS or PE on top of ARDS.  We discussed the risks of tPA with his large stroke and chance of irreversible bleeding in head.  For now, we are going to do full dose lovenox.  If he decompensates further, they consent to systemic tPA.  Will confirm findings on echo with CTA chest.  Hold on CTA head and neck at this time to limit dye load.  Myrla Halsted MD PCCM

## 2019-02-17 NOTE — Progress Notes (Signed)
OT Cancellation Note  Patient Details Name: Andrew Bruce MRN: [id number] DOB: 1965/06/25   Cancelled Treatment:    Reason Eval/Treat Not Completed: Patient not medically ready Per PT Who spoke with RN, pt has new finding of CVA. Will hold until next date to establish findings.  Dalphine Handing, MSOT, OTR/L [facility] Office Number: [phone]  Dalphine Handing 02/17/2019, 2:19 PM

## 2019-02-17 NOTE — Progress Notes (Signed)
eLink Physician-Brief Progress Note Patient Name: Andrew Bruce DOB: February 20, 1965 MRN: [id number]   Date of Service  02/17/2019  HPI/Events of Note  Head CT Scan ordered d/t new onset R sided weakness reveals: 1. No acute intracranial hemorrhage. 2. Large area of infarct in the left occipital lobe, likely subacute. Clinical correlation is recommended. Additional smaller right frontal convexity infarct also noted. MRI may provide better evaluation if clinically indicated. The patient is currently on DVT prophylaxis with Lovenox  eICU Interventions  Will order: 1. Neurology consult - Dr. Amada Jupiter will evaluate the patient.     Intervention Category Major Interventions: Other:  Tineshia Becraft Dennard Nip 02/17/2019, 12:08 AM

## 2019-02-17 NOTE — Progress Notes (Signed)
Cta result noted. Continue full dose lovenox with plans as stated in previous note.

## 2019-02-17 NOTE — Progress Notes (Signed)
Updated family on patient condition at this time. Spoke with pt's daughter, Judeth Cornfield, as wife's listed number went to voicemail. Family states they would like the pt to remain a full code at this time after discussion with MD regarding pt's declining status this morning. Will continue to monitor.

## 2019-02-17 NOTE — Progress Notes (Addendum)
MD at bedside assessing pt; Charge RN accompanying. Pt is in no distress at this time. Confirmed with MD and Charge RN that pt has no PMH and no known indwelling metal devices, as pt is scheduled for MRI on this shift. Will continue to monitor.

## 2019-02-17 NOTE — Progress Notes (Signed)
NAME:  Andrew Bruce, MRN:  [id number], DOB:  1965-02-27, LOS: 30 ADMISSION DATE:  02/06/2019, CONSULTATION DATE:  02/06/19 REFERRING MD:  ER, CHIEF COMPLAINT:  SOB   Brief History   This is a 54 year old man without a past medical history who presents with worsening shortness of breath over the past 2 days in the context of a 10-day Covid illness.  Chest x-ray and vitals are consistent with a worsening Covid ARDS.  History of present illness   This is a 54 year old man without a past medical history who presents with worsening shortness of breath over the past 2 days in the context of a 10-day Covid illness.  Chest x-ray and vitals are consistent with a worsening Covid ARDS.  History performed with Spanish interpreter as patient is Spanish-speaking only.  He states he was feeling fatigued but stable since an ER visit last week for Covid.  Over the past 2 days he has noticed a dramatic increase in his shortness of breath.  He is dyspneic even speaking.  He was placed on a nonrebreather and nasal cannula in the ER and is still mildly tachypneic with sats in the 80s.  He denies chest pain, productive cough.  He does have a dry cough with inspiration that is paroxysmal.  He denies any leg swelling.  He has lost his sense of taste and smell.  PCCM consulted for admission.  Patient has never been hospitalized for.  He has no chronic medical issues and is on no home medications.  Past Medical History  None  Significant Hospital Events   02/06/2019 admitted  Consults:  None  Procedures:  Intubated 1/24 Bilateral chest tube placement 1/25  Significant Diagnostic Tests:  Chest x-ray with an ARDS pattern  Micro Data:  Covid positive Pct Neg  Antimicrobials:  Remdesivir 02/06/19>>1/28 Dexamethasone 02/06/19>>2/2   Interim history/subjective:  Noted as sedation lightened to not move R side overnight. CT showing subacute L occipital infarct and smaller R frontal infarct  Objective   Blood  pressure 120/81, pulse 91, temperature 98.7 F (37.1 C), temperature source Oral, resp. rate (!) 22, height 5\' 6"  (1.676 m), weight 76.4 kg, SpO2 90 %.    Vent Mode: PCV FiO2 (%):  [50 %-80 %] 60 % Set Rate:  [15 bmp] 15 bmp PEEP:  [8 cmH20-10 cmH20] 10 cmH20 Pressure Support:  [10 cmH20] 10 cmH20 Plateau Pressure:  [16 cmH20-23 cmH20] 16 cmH20   Intake/Output Summary (Last 24 hours) at 02/17/2019 0840 Last data filed at 02/17/2019 3151 Gross per 24 hour  Intake 3070.9 ml  Output 2490 ml  Net 580.9 ml   Filed Weights   02/15/19 0306 02/17/19 0000 02/17/19 0425  Weight: 83 kg 76.4 kg 76.4 kg   Examination:  GEN: ill appearing man coughing on vent HEENT: ETT in place, no secretions CV: tachycardic, ext warm PULM: scattered rhonci GI: Soft, +BS EXT: No edema NEURO: not withdrawing anything for me PSYCH: sedated except for incessant coughing SKIN: no rashes   Resolved Hospital Problem list   N/A  Assessment & Plan:   Acute hypoxemic respiratory failure due to COVID-19 pneumonia, ARDS, bilateral pneumothorax status post bilateral chest tube placement 02/08/2019 Continue mechanical ventilation per ARDS protocol Target TVol 6-8cc/kgIBW Target Plateau Pressure < 30cm H20 Target driving pressure less than 15 cm of water Target PaO2 55-65: titrate PEEP/FiO2 per protocol No additional plans for proning at this time Check CVP daily if CVL in place Target CVP less than 4, diurese as necessary  Ventilator associated pneumonia prevention protocol  Plan today: re-sedate, try to keep synchronous with ventilator, paralytics if needed, will need trach depending on GoC  Left occipital and R frontal CVAs- in setting of COVID could be embolic vs. Thrombotic.   - MRI pending - Neuro following - ASA ordered  COVID-19 pneumonia/ARDS Complete 5 days remdesivir, 10 days dexamethasone Lovenox 0.5 mg/kg DVT PPx  Steroid-induced hyperglycemia Levemir plus tube feed standing plus SSI  At  risk malnutrition Continue tube feeds at goal  Oliguria, hypernatremia- Now hyponatremic, stop FWF.  Best practice:  Diet: NPO, TF Pain/Anxiety/Delirium protocol (if indicated): See above VAP protocol (if indicated): Ordered DVT prophylaxis: 0.5mg /kg BID Lovenox GI prophylaxis: Pepcid Glucose control: SSI and Levemir Mobility: Bedrest Code Status: Full Family Communication: will reach out  Disposition: ICU  Labs   CBC: Recent Labs  Lab 02/11/19 0451 02/11/19 0451 02/12/19 3267 02/12/19 1245 02/13/19 8099 02/13/19 2023 02/14/19 8338 02/14/19 0428 02/14/19 1421 02/15/19 0349 02/15/19 0530 02/16/19 0421 02/17/19 0424  WBC 6.1   < > 8.1   < > 10.5  --  12.1*  --   --   --  12.6* 14.4* 18.9*  NEUTROABS 4.8  --  6.2  --   --   --   --   --   --   --   --   --   --   HGB 10.0*   < > 11.3*   < > 10.4*   < > 11.1*   < > 13.9 13.3 12.6* 11.7* 11.7*  HCT 32.9*   < > 37.2*   < > 33.4*   < > 35.3*   < > 41.0 39.0 40.2 36.4* 37.0*  MCV 90.9   < > 91.0   < > 88.8  --  90.1  --   --   --  88.9 86.9 87.9  PLT 278   < > 273   < > 267  --  326  --   --   --  425* 385 420*   < > = values in this interval not displayed.    Basic Metabolic Panel: Recent Labs  Lab 02/11/19 0451 02/11/19 0451 02/12/19 2505 02/12/19 3976 02/13/19 7341 02/13/19 2023 02/14/19 9379 02/14/19 0428 02/14/19 1421 02/15/19 0349 02/15/19 0530 02/16/19 0421 02/17/19 0424  NA 148*   < > 145   < > 133*   < > 142   < > 135 136 138 136 132*  K 4.4   < > 3.9   < > 3.8   < > 3.8   < > 4.1 3.9 3.9 3.3* 3.6  CL 107   < > 108   < > 97*  --  100  --   --   --  90* 89* 93*  CO2 29   < > 30   < > 29  --  31  --   --   --  38* 36* 31  GLUCOSE 213*   < > 110*   < > 209*  --  149*  --   --   --  108* 166* 235*  BUN 26*   < > 28*   < > 20  --  20  --   --   --  25* 49* 31*  CREATININE 0.67   < > 0.61   < > 0.65  --  0.60*  --   --   --  0.66 1.03 0.68  CALCIUM 8.2*   < >  8.1*   < > 7.5*  --  8.3*  --   --   --  8.8*  8.3* 8.4*  MG 2.7*  --  2.7*  --  1.9  --  2.2  --   --   --  2.5*  --   --   PHOS  --   --   --   --   --   --   --   --   --   --  5.1*  --   --    < > = values in this interval not displayed.   GFR: Estimated Creatinine Clearance: 97.5 mL/min (by C-G formula based on SCr of 0.68 mg/dL). Recent Labs  Lab 02/14/19 0428 02/15/19 0530 02/16/19 0421 02/17/19 0424  WBC 12.1* 12.6* 14.4* 18.9*    Liver Function Tests: Recent Labs  Lab 02/11/19 0451 02/12/19 0623  AST 20 21  ALT 25 28  ALKPHOS 53 63  BILITOT 0.8 0.4  PROT 5.2* 5.4*  ALBUMIN 2.6* 2.6*   No results for input(s): LIPASE, AMYLASE in the last 168 hours. No results for input(s): AMMONIA in the last 168 hours.  ABG    Component Value Date/Time   PHART 7.496 (H) 02/15/2019 0349   PCO2ART 53.3 (H) 02/15/2019 0349   PO2ART 74.0 (L) 02/15/2019 0349   HCO3 41.2 (H) 02/15/2019 0349   TCO2 43 (H) 02/15/2019 0349   ACIDBASEDEF 2.0 02/08/2019 1444   O2SAT 95.0 02/15/2019 0349     Coagulation Profile: No results for input(s): INR, PROTIME in the last 168 hours.  Cardiac Enzymes: No results for input(s): CKTOTAL, CKMB, CKMBINDEX, TROPONINI in the last 168 hours.  HbA1C: Hgb A1c MFr Bld  Date/Time Value Ref Range Status  02/17/2019 04:25 AM 11.7 (H) 4.8 - 5.6 % Final    Comment:    (NOTE) Pre diabetes:          5.7%-6.4% Diabetes:              >6.4% Glycemic control for   <7.0% adults with diabetes   02/06/2019 06:54 AM 12.6 (H) 4.8 - 5.6 % Final    Comment:    (NOTE) Pre diabetes:          5.7%-6.4% Diabetes:              >6.4% Glycemic control for   <7.0% adults with diabetes     CBG: Recent Labs  Lab 02/16/19 1518 02/16/19 1946 02/16/19 2327 02/17/19 0319 02/17/19 0738  GLUCAP 139* 170* 204* 222* 190*    This patient is critically ill with multiple organ system failure; which, requires frequent high complexity decision making, assessment, support, evaluation, and titration of therapies.  This was completed through the application of advanced monitoring technologies and extensive interpretation of multiple databases. During this encounter critical care time was devoted to patient care services described in this note for 35 minutes.  Lorin Glass, [facility] 02/17/2019 8:40 AM

## 2019-02-17 NOTE — Progress Notes (Signed)
PT Cancellation Note  Patient Details Name: Andrew Bruce MRN: [id number] DOB: 01-10-1966   Cancelled Treatment:    Reason Eval/Treat Not Completed: Medical issues which prohibited therapy(New finding of CVA per nurse, cancel today. )   [name] 02/17/2019, 1:57 PM [name],PT Acute Rehabilitation Services Pager:  [phone]  Office:  [phone]

## 2019-02-18 ENCOUNTER — Inpatient Hospital Stay: Payer: Self-pay

## 2019-02-18 DIAGNOSIS — J80 Acute respiratory distress syndrome: Secondary | ICD-10-CM

## 2019-02-18 LAB — CBC WITH DIFFERENTIAL/PLATELET
Abs Immature Granulocytes: 0.2 10*3/uL — ABNORMAL HIGH (ref 0.00–0.07)
Basophils Absolute: 0 10*3/uL (ref 0.0–0.1)
Basophils Relative: 0 %
Eosinophils Absolute: 0.2 10*3/uL (ref 0.0–0.5)
Eosinophils Relative: 1 %
HCT: 35.1 % — ABNORMAL LOW (ref 39.0–52.0)
Hemoglobin: 10.9 g/dL — ABNORMAL LOW (ref 13.0–17.0)
Immature Granulocytes: 1 %
Lymphocytes Relative: 6 %
Lymphs Abs: 1 10*3/uL (ref 0.7–4.0)
MCH: 27.9 pg (ref 26.0–34.0)
MCHC: 31.1 g/dL (ref 30.0–36.0)
MCV: 90 fL (ref 80.0–100.0)
Monocytes Absolute: 0.6 10*3/uL (ref 0.1–1.0)
Monocytes Relative: 3 %
Neutro Abs: 15.2 10*3/uL — ABNORMAL HIGH (ref 1.7–7.7)
Neutrophils Relative %: 89 %
Platelets: 387 10*3/uL (ref 150–400)
RBC: 3.9 MIL/uL — ABNORMAL LOW (ref 4.22–5.81)
RDW: 13.5 % (ref 11.5–15.5)
WBC: 17.1 10*3/uL — ABNORMAL HIGH (ref 4.0–10.5)
nRBC: 0 % (ref 0.0–0.2)

## 2019-02-18 LAB — BASIC METABOLIC PANEL
Anion gap: 9 (ref 5–15)
BUN: 36 mg/dL — ABNORMAL HIGH (ref 6–20)
CO2: 37 mmol/L — ABNORMAL HIGH (ref 22–32)
Calcium: 8.8 mg/dL — ABNORMAL LOW (ref 8.9–10.3)
Chloride: 93 mmol/L — ABNORMAL LOW (ref 98–111)
Creatinine, Ser: 0.62 mg/dL (ref 0.61–1.24)
GFR calc Af Amer: >60 mL/min (ref >60)
GFR calc non Af Amer: >60 mL/min (ref >60)
Glucose, Bld: 107 mg/dL — ABNORMAL HIGH (ref 70–99)
Potassium: 3.4 mmol/L — ABNORMAL LOW (ref 3.5–5.1)
Sodium: 139 mmol/L (ref 135–145)

## 2019-02-18 LAB — GLUCOSE, CAPILLARY
Glucose-Capillary: 38 mg/dL — CL (ref 70–99)
Glucose-Capillary: 102 mg/dL — ABNORMAL HIGH (ref 70–99)
Glucose-Capillary: 115 mg/dL — ABNORMAL HIGH (ref 70–99)
Glucose-Capillary: 142 mg/dL — ABNORMAL HIGH (ref 70–99)
Glucose-Capillary: 249 mg/dL — ABNORMAL HIGH (ref 70–99)
Glucose-Capillary: 93 mg/dL (ref 70–99)

## 2019-02-18 LAB — LDL CHOLESTEROL, DIRECT: Direct LDL: 73 mg/dL (ref 0–99)

## 2019-02-18 MED ORDER — SODIUM CHLORIDE 0.9% FLUSH
10.0000 mL | INTRAVENOUS | Status: DC | PRN
  Administered 2019-03-11: 14:00:00 10 mL

## 2019-02-18 MED ORDER — SODIUM CHLORIDE 0.9% FLUSH
10.0000 mL | Freq: Two times a day (BID) | INTRAVENOUS | Status: DC
  Administered 2019-02-18 – 2019-03-07 (×30): 10 mL
  Administered 2019-03-08: 20 mL
  Administered 2019-03-08 – 2019-03-23 (×29): 10 mL

## 2019-02-18 MED ORDER — POTASSIUM CHLORIDE 20 MEQ/15ML (10%) PO SOLN
20.0000 meq | ORAL | Status: AC
  Administered 2019-02-18 (×2): 20 meq
  Filled 2019-02-18 (×2): qty 15

## 2019-02-18 NOTE — Progress Notes (Signed)
NAME:  Andrew Bruce, MRN:  [id number], DOB:  Jul 07, 1965, LOS: 12 ADMISSION DATE:  02/06/2019, CONSULTATION DATE:  02/06/19 REFERRING MD:  ER, CHIEF COMPLAINT:  SOB   Brief History   [name] without a past medical history admitted 1/23 with 48 hours of worsening shortness of breath in the context of a 10-day Covid illness (dry cough, lost taste/smell).  Chest x-ray and vitals are consistent with a worsening COVID ARDS.  Admitted for observation.  Developed worsening shortness of breath and increasing O2 needs. Decompensated requiring intubation 1/24.  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   Past Medical History  None known prior to admit   Significant Hospital Events   1/23 Admit  1/24 Decompensated, tx to ICU, intubated   Consults:  Neurology   Procedures:  ETT 1/24 >> Bilateral chest tube placement 1/25 >> R Femoral TLC 1/25 >>   Significant Diagnostic Tests:  CT Head 2/2 >> no acute intracranial hemorrhage, large area of infarct in the left occipital lobe, likely subacute CTA Chest 2/3 >> LLL segmental PE, no CT evidence of right heart strain, multifocal PNA, background emphysema and probable degree of interstitial fibrosis ECHO 2/3 >> LVEF 55-60%, no WMA, hypokinetic right ventricular free wall, McConnells sign present.  RV findings consistent with acute cor pulmonale, small pericardial effusion, mildly elevated pulmonary artery systolic pressure  Micro Data:  COVID 1/12 >> positive  PCT 1/23 >> Neg  Antimicrobials:  Remdesivir 02/06/19>>1/28 Dexamethasone 02/06/19>>2/2   Interim history/subjective:  Afebrile  Glucose range 100-210 I/O - 2L UOP, 2.1L + in 24h PEEP 15, fiO2 90% RN reports pt remains sedate on paralytics   Objective   Blood pressure 97/77, pulse (!) 102, temperature 98.6 F (37 C), temperature source Oral, resp. rate (!) 0, height 5\' 6"  (1.676 m), weight 77.2 kg, SpO2 93 %.    Vent  Mode: PCV FiO2 (%):  [90 %-100 %] 90 % Set Rate:  [30 bmp] 30 bmp PEEP:  [15 cmH20] 15 cmH20 Plateau Pressure:  [30 cmH20-36 cmH20] 35 cmH20   Intake/Output Summary (Last 24 hours) at 02/18/2019 04/18/2019 Last data filed at 02/18/2019 0900 Gross per 24 hour  Intake 1990.26 ml  Output 1910 ml  Net 80.26 ml   Filed Weights   02/17/19 0000 02/17/19 0425 02/18/19 0319  Weight: 76.4 kg 76.4 kg 77.2 kg   Examination:  General: critically ill appearing adult male lying in bed on vent   HEENT: MM pink/moist, ETT Neuro: sedate / paralyzed on NMB CV: s1s2 RRR, no m/r/g PULM:  Non-labored, vent assisted breath sounds, bilateral chest tubes in place GI: soft, bsx4 active  Extremities: warm/dry, trace to 1+ generalized edema  Skin: no rashes or lesions  Resolved Hospital Problem list   Oliguria, hypernatremia  Assessment & Plan:   Acute hypoxemic respiratory failure due to COVID-19 pneumonia, ARDS, bilateral pneumothorax status post bilateral chest tube placement 02/08/2019 -low Vt ventilation 4-8cc/kg -goal plateau pressure <30, driving pressure 02/10/2019 cm <97 -target PaO2 55-65, titrate PEEP/FiO2 per ARDS protocol  -hold further prone therapy at this time  -goal CVP <4, diuresis as necessary -VAP prevention measures  -follow intermittent CXR   Sedation Needs in setting of Mechanical Ventilation  -PAD protocol with propofol, fentanyl gtt  -NMB protocol  -RASS Goal -4 to -5 with NMB  Hypotension  Suspect element of sedation.  Afebrile. -levophed for MAP >65   Aortic Clot  Noted on CTA chest 2/3.   -await  return call from Neuro IR, CVTS discussion for plan of care -continue full dose lovenox   Left Occipital and R frontal CVA Suspected embolic in nature in setting of COVID.     -hold MRI for now -appreciate Neurology evaluation  -continue ASA   COVID-19 pneumonia/ARDS Completed remdesivir, dexamethasone. -continue full dose lovenox   Anemia  -trend CBC  Steroid-induced  Hyperglycemia -SSI, resistant scale  -continue levemir 25 units BID  -2 units TF coverage Q4  At Risk Malnutrition -continue TF   Limited IV Access  -PICC line ordered  Best practice:  Diet: NPO, TF Pain/Anxiety/Delirium protocol (if indicated): in place VAP protocol (if indicated): Ordered DVT prophylaxis: lovenox GI prophylaxis: Pepcid Glucose control: SSI and Levemir Mobility: Bedrest Code Status: Full Code  Family Communication: Daughter Judeth Cornfield) called for update 2/4 with no answer.  Message left for return call.  Disposition: ICU  Labs   CBC: Recent Labs  Lab 02/12/19 0623 02/13/19 0551 02/14/19 0428 02/14/19 1421 02/15/19 0349 02/15/19 0530 02/16/19 0421 02/17/19 0424 02/18/19 0250  WBC 8.1   < > 12.1*  --   --  12.6* 14.4* 18.9* 17.1*  NEUTROABS 6.2  --   --   --   --   --   --   --  15.2*  HGB 11.3*   < > 11.1*   < > 13.3 12.6* 11.7* 11.7* 10.9*  HCT 37.2*   < > 35.3*   < > 39.0 40.2 36.4* 37.0* 35.1*  MCV 91.0   < > 90.1  --   --  88.9 86.9 87.9 90.0  PLT 273   < > 326  --   --  425* 385 420* 387   < > = values in this interval not displayed.    Basic Metabolic Panel: Recent Labs  Lab 02/12/19 0623 02/12/19 0623 02/13/19 0551 02/13/19 2023 02/14/19 0428 02/14/19 1421 02/15/19 0349 02/15/19 0530 02/16/19 0421 02/17/19 0424 02/18/19 0250  NA 145   < > 133*   < > 142   < > 136 138 136 132* 139  K 3.9   < > 3.8   < > 3.8   < > 3.9 3.9 3.3* 3.6 3.4*  CL 108   < > 97*  --  100  --   --  90* 89* 93* 93*  CO2 30   < > 29  --  31  --   --  38* 36* 31 37*  GLUCOSE 110*   < > 209*  --  149*  --   --  108* 166* 235* 107*  BUN 28*   < > 20  --  20  --   --  25* 49* 31* 36*  CREATININE 0.61   < > 0.65  --  0.60*  --   --  0.66 1.03 0.68 0.62  CALCIUM 8.1*   < > 7.5*  --  8.3*  --   --  8.8* 8.3* 8.4* 8.8*  MG 2.7*  --  1.9  --  2.2  --   --  2.5*  --   --   --   PHOS  --   --   --   --   --   --   --  5.1*  --   --   --    < > = values in this  interval not displayed.   GFR: Estimated Creatinine Clearance: 105.7 mL/min (by C-G formula based on SCr of 0.62 mg/dL).  Recent Labs  Lab 02/15/19 0530 02/16/19 0421 02/17/19 0424 02/18/19 0250  WBC 12.6* 14.4* 18.9* 17.1*    Liver Function Tests: Recent Labs  Lab 02/12/19 0623  AST 21  ALT 28  ALKPHOS 63  BILITOT 0.4  PROT 5.4*  ALBUMIN 2.6*   No results for input(s): LIPASE, AMYLASE in the last 168 hours. No results for input(s): AMMONIA in the last 168 hours.  ABG    Component Value Date/Time   PHART 7.496 (H) 02/15/2019 0349   PCO2ART 53.3 (H) 02/15/2019 0349   PO2ART 74.0 (L) 02/15/2019 0349   HCO3 41.2 (H) 02/15/2019 0349   TCO2 43 (H) 02/15/2019 0349   ACIDBASEDEF 2.0 02/08/2019 1444   O2SAT 95.0 02/15/2019 0349     Coagulation Profile: No results for input(s): INR, PROTIME in the last 168 hours.  Cardiac Enzymes: No results for input(s): CKTOTAL, CKMB, CKMBINDEX, TROPONINI in the last 168 hours.  HbA1C: Hgb A1c MFr Bld  Date/Time Value Ref Range Status  02/17/2019 04:25 AM 11.7 (H) 4.8 - 5.6 % Final    Comment:    (NOTE) Pre diabetes:          5.7%-6.4% Diabetes:              >6.4% Glycemic control for   <7.0% adults with diabetes   02/06/2019 06:54 AM 12.6 (H) 4.8 - 5.6 % Final    Comment:    (NOTE) Pre diabetes:          5.7%-6.4% Diabetes:              >6.4% Glycemic control for   <7.0% adults with diabetes     CBG: Recent Labs  Lab 02/17/19 1605 02/17/19 1919 02/17/19 2309 02/18/19 0312 02/18/19 0748  GLUCAP 322* 330* 210* 102* 115*     CC Time: 32 minutes  Noe Gens, MSN, NP-C [facility] 02/18/2019, 10:38 AM   Please see [email or web address] for pager details.

## 2019-02-18 NOTE — Progress Notes (Signed)
STROKE TEAM PROGRESS NOTE   INTERVAL HISTORY Patient remains critically ill and is sedated, intubated and requiring pressor support.  We have been unable to reach family despite multiple phone calls and living message  Vitals:   02/18/19 1230 02/18/19 1245 02/18/19 1300 02/18/19 1315  BP: (!) 80/58 (!) 80/60 (!) 77/55 (!) 85/56  Pulse: (!) 104 (!) 104 (!) 101 100  Resp: (!) 30 (!) 30 (!) 30 (!) 30  Temp:      TempSrc:      SpO2: 96% 96% 97% 97%  Weight:      Height:        CBC:  Recent Labs  Lab 02/12/19 0623 02/13/19 0551 02/17/19 0424 02/18/19 0250  WBC 8.1   < > 18.9* 17.1*  NEUTROABS 6.2  --   --  15.2*  HGB 11.3*   < > 11.7* 10.9*  HCT 37.2*   < > 37.0* 35.1*  MCV 91.0   < > 87.9 90.0  PLT 273   < > 420* 387   < > = values in this interval not displayed.    Basic Metabolic Panel:  Recent Labs  Lab 02/14/19 0428 02/14/19 1421 02/15/19 0530 02/16/19 0421 02/17/19 0424 02/18/19 0250  NA 142   < > 138   < > 132* 139  K 3.8   < > 3.9   < > 3.6 3.4*  CL 100  --  90*   < > 93* 93*  CO2 31  --  38*   < > 31 37*  GLUCOSE 149*  --  108*   < > 235* 107*  BUN 20  --  25*   < > 31* 36*  CREATININE 0.60*  --  0.66   < > 0.68 0.62  CALCIUM 8.3*  --  8.8*   < > 8.4* 8.8*  MG 2.2  --  2.5*  --   --   --   PHOS  --   --  5.1*  --   --   --    < > = values in this interval not displayed.   Lipid Panel:     Component Value Date/Time   CHOL 134 02/17/2019 0424   TRIG 134 02/17/2019 1222   HDL 33 (L) 02/17/2019 0424   CHOLHDL 4.1 02/17/2019 0424   VLDL 26 02/17/2019 0424   LDLCALC NOT CALCULATED 02/17/2019 0424   HgbA1c:  Lab Results  Component Value Date   HGBA1C 11.7 (H) 02/17/2019   IMAGING past 48 hours CT HEAD WO CONTRAST  Result Date: 02/16/2019 CLINICAL DATA:  54 year old male with focal neurologic deficit. Recent COVID diagnosis. EXAM: CT HEAD WITHOUT CONTRAST TECHNIQUE: Contiguous axial images were obtained from the base of the skull through the vertex  without intravenous contrast. COMPARISON:  None. FINDINGS: Brain: The ventricles and sulci appropriate size for patient's age. There is an area of hypodensity in the left occipital lobe most consistent with edema. A smaller focal area of hypodensity noted in the right frontal convexity (series 3 images 25 and 26). There is a small focus of calcification in the right frontal lobe, likely sequela of prior insult. There is no acute intracranial hemorrhage. No mass effect or midline shift. No extra-axial fluid collection. Vascular: No hyperdense vessel or unexpected calcification. Skull: Normal. Negative for fracture or focal lesion. Sinuses/Orbits: Partial opacification of the left maxillary sinus. The remainder of the visualized paranasal sinuses are clear. No air-fluid level. There is bilateral mastoid effusions. Other: None  IMPRESSION: 1. No acute intracranial hemorrhage. 2. Large area of infarct in the left occipital lobe, likely subacute. Clinical correlation is recommended. Additional smaller right frontal convexity infarct also noted. MRI may provide better evaluation if clinically indicated. These results were called by telephone at the time of interpretation on 02/16/2019 at 11:22 pm to nurse Brien Mates, who verbally acknowledged these results. Electronically Signed   By: Elgie Collard M.D.   On: 02/16/2019 23:28   CT ANGIO CHEST PE W OR WO CONTRAST  Result Date: 02/17/2019 CLINICAL DATA:  54 year old male with concern for pulmonary embolism. EXAM: CT ANGIOGRAPHY CHEST WITH CONTRAST TECHNIQUE: Multidetector CT imaging of the chest was performed using the standard protocol during bolus administration of intravenous contrast. Multiplanar CT image reconstructions and MIPs were obtained to evaluate the vascular anatomy. CONTRAST:  OMNIPAQUE IOHEXOL 350 MG/ML SOLN COMPARISON:  Chest radiograph dated 02/17/2019. FINDINGS: Cardiovascular: There is no cardiomegaly. There is trace pericardial effusion. Mild  coronary vascular calcification of the LAD. There is mild atherosclerotic calcification of the aorta with calcified and noncalcified plaque of the aortic arch. No aneurysmal dilatation or dissection. There is a pulmonary artery embolism in the left lower lobe segmental branch. No other pulmonary artery embolus identified. There is no CT evidence of right heart straining. Mediastinum/Nodes: Mildly enlarged right hilar lymph nodes measuring up to 16 mm in short axis, likely reactive. An enteric tube is noted within the esophagus. No mediastinal fluid collection. Lungs/Pleura: Background of emphysema and diffuse interstitial coarsening/scarring. Bibasilar and subpleural interstitial coarsening, likely related to fibrosis. Bilateral lower lobe/bibasilar consolidative changes with air bronchograms may represent pneumonia. There is mild lower lobe predominant bronchiectatic changes. Scattered bilateral ground-glass airspace opacities most consistent with pneumonia, possibly viral or atypical. Bilateral chest tubes with tip along the posterior pleural surfaces noted. There is no pleural effusion or pneumothorax. The central airways are patent. Upper Abdomen: Enteric tube extends into the body of the stomach. Musculoskeletal: No acute osseous pathology. Review of the MIP images confirms the above findings. IMPRESSION: 1. Left lower lobe segmental pulmonary artery embolus. No CT evidence of right heart straining. 2. Multifocal pneumonia. 3. Background of emphysema and probable degree of interstitial fibrosis. 4.  Aortic Atherosclerosis (ICD10-I70.0). These results were called by telephone at the time of interpretation on 02/17/2019 at 7:15 pm to nurse [facility], who verbally acknowledged these results. Electronically Signed   By: Elgie Collard M.D.   On: 02/17/2019 19:22   DG CHEST PORT 1 VIEW  Result Date: 02/17/2019 CLINICAL DATA:  COVID pneumonia EXAM: PORTABLE CHEST 1 VIEW COMPARISON:  02/15/2019 FINDINGS: The  endotracheal tube is 4 cm above the carina. The NG tube is stable. Stable bilateral chest tubes. No pneumothorax. Persistent bilateral interstitial infiltrates. No definite pleural effusions. IMPRESSION: 1. Stable support apparatus. 2. Persistent interstitial infiltrates. Electronically Signed   By: Rudie Meyer M.D.   On: 02/17/2019 08:31   ECHOCARDIOGRAM LIMITED  Result Date: 02/17/2019   ECHOCARDIOGRAM LIMITED REPORT   Patient Name:   OLLIVANDER LADUKE Date of Exam: 02/17/2019 Medical Rec #:  [id number]       Height:       66.0 in Accession #:    [id number]      Weight:       168.4 lb Date of Birth:  [birth year]       BSA:          1.86 m Patient Age:    53 years        BP:  99/70 mmHg Patient Gender: M               HR:           104 bpm. Exam Location:  Inpatient  Procedure: Limited Echo, Limited Color Doppler and Cardiac Doppler Indications:    Stroke I163.9  History:        Patient has no prior history of Echocardiogram examinations.                 Covid-19 Positive  Sonographer:    Thurman Coyer RDCS (AE) Referring Phys: [phone] MCNEILL P KIRKPATRICK  Sonographer Comments: Echo performed with patient supine and on artificial respirator. IMPRESSIONS  1. Left ventricular ejection fraction, by visual estimation, is 55 to 60%. The left ventricle has normal function. There is no increased left ventricular wall thickness.  2. The left ventricle has no regional wall motion abnormalities.  3. Global right ventricle has moderately reduced systolic function.The right ventricular size is mildly enlarged. no increase in right ventricular wall thickness.  4. Hypokinetic right ventricular free wall. McConnell's sign is present.  5. Right ventricle findings are consistent with acute cor pulmonale, such as pulmonary embolism.  6. Small pericardial effusion.  7. The pericardial effusion is anterior to the right ventricle.  8. The tricuspid valve was normal in structure. Tricuspid valve regurgitation is trivial.  9.  The aortic root was not well visualized. 10. Mildly elevated pulmonary artery systolic pressure. 11. The inferior vena cava is dilated in size with <50% respiratory variability, suggesting severely increased right atrial pressure, butt his is a nonspecific finding due to positive pressure ventilation. 12. Findings reviewed with Dr. Adaline Sill, MD. FINDINGS  Left Ventricle: Left ventricular ejection fraction, by visual estimation, is 55 to 60%. The left ventricle has normal function. The left ventricle has no regional wall motion abnormalities. The left ventricular internal cavity size was the left ventricle is normal in size. There is no increased left ventricular wall thickness. Left ventricular diastolic parameters were normal. Normal left atrial pressure. Right Ventricle: The right ventricular size is mildly enlarged. No increase in right ventricular wall thickness. Global RV systolic function is has moderately reduced systolic function. The tricuspid regurgitant velocity is 2.06 m/s, and with an assumed right atrial pressure of 15 mmHg, the estimated right ventricular systolic pressure is mildly elevated at 32.0 mmHg. The motion of the right ventricle free wall hypokinetic. Right ventricle findings are consistent with cor pulmonale. Left Atrium: Left atrial size was normal in size. Right Atrium: Right atrial size was normal in size. Right atrial pressure is estimated at 15 mmHg. Pericardium: A small pericardial effusion is present is seen. A small pericardial effusion is present. The pericardial effusion is anterior to the right ventricle. There is no evidence of cardiac tamponade. Mitral Valve: MV Area by PHT, 3.99 cm. MV PHT, 55.10 msec. Tricuspid Valve: The tricuspid valve is normal in structure. Tricuspid valve regurgitation is trivial. Aortic Valve: The aortic valve is normal in structure. Aortic valve regurgitation is not visualized. Pulmonic Valve: The pulmonic valve was not well visualized. Pulmonic valve  regurgitation is not visualized by color flow Doppler. Pulmonic regurgitation is not visualized by color flow Doppler. Aorta: The aortic root was not well visualized. Venous: The inferior vena cava is dilated in size with less than 50% respiratory variability, suggesting right atrial pressure of 15 mmHg. Shunts: No atrial level shunt detected by color flow Doppler.  LEFT VENTRICLE  Normals PLAX 2D LVIDd:         4.00 cm  3.6 cm   Diastology                 Normals LVIDs:         2.70 cm  1.7 cm   LV e' lateral:   7.18 cm/s 6.42 cm/s LV PW:         0.80 cm  1.4 cm   LV E/e' lateral: 9.2       15.4 LV IVS:        1.00 cm  1.3 cm   LV e' medial:    8.38 cm/s 6.96 cm/s LVOT diam:     2.30 cm  2.0 cm   LV E/e' medial:  7.9       6.96 LV SV:         43 ml    79 ml LV SV Index:   22.59    45 ml/m2 LVOT Area:     4.15 cm 3.14 cm2  LEFT ATRIUM           Index       RIGHT ATRIUM           Index LA diam:      2.30 cm 1.24 cm/m  RA Area:     10.00 cm LA Vol (A4C): 34.7 ml 18.67 ml/m RA Volume:   16.90 ml  9.09 ml/m   AORTA                 Normals Ao Root diam: 3.00 cm 31 mm MITRAL VALVE              Normals   TRICUSPID VALVE             Normals MV Area (PHT): 3.99 cm             TR Peak grad:   17.0 mmHg MV PHT:        55.10 msec 55 ms     TR Vmax:        206.00 cm/s 288 cm/s MV Decel Time: 190 msec   187 ms MV E velocity: 66.00 cm/s 103 cm/s  SHUNTS MV A velocity: 63.40 cm/s 70.3 cm/s Systemic Diam: 2.30 cm MV E/A ratio:  1.04       1.5  Mihai Croitoru MD Electronically signed by Sanda Klein MD Signature Date/Time: 02/17/2019/5:33:09 PM    Final    [place] EKG SITE RITE  Result Date: 02/18/2019 If Site Rite image not attached, placement could not be confirmed due to current cardiac rhythm.   PHYSICAL EXAM Frail malnourished looking middle-aged male who is sedated intubated and paralyzed. . Afebrile. Head is nontraumatic. Neck is supple without bruit.    Cardiac exam no murmur or gallop. Lungs are clear to  auscultation. Distal pulses are well felt. Neurological Exam :  Patient is sedated intubated paralyzed.  Eyes are closed.  Pupils 3 mm sluggishly reactive.  Doll's eye movement of sluggish.  Fundi not visualized.  Eyes are dysconjugate with slight right eye exotropia.  Face is symmetric.  Tongue midline.  Motor system exam no response to noxious stimuli including sternal rub and nailbed pressure.  Tone is diminished bilaterally.  Plantars are not elicitable bilaterally.  ASSESSMENT/PLAN Mr. Mahlon Gabrielle is a 54 y.o. male with no significant past medical history admitted to the hospital 1/23 w/ SOB d/t COVID treated with remdesivir, dexamethasone and full dose anticoagulation. Intubated on the  25th. On the 27th, AC was decreased. Continued to need significant sedation for vent synchrony and and proning. Once sedation lifted, noted to not be moving his R side. CT with L brain infarcts.   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 Possible R frontal convexity infarct.   CTA head & neck pending   MRI  pending   Lower extremity Doppler  pending   Transcranial Doppler pending    2D Echo pending   LDL NOT CALCULATED (Cho 134, HDL 33)  HgbA1c 11.7  Lovenox 40 mg sq q 12h for VTE prophylaxis  No antithrombotic prior to admission, now on aspirin 300 mg suppository daily.   Therapy recommendations:  pending   Disposition:  pending   Acuate hypoxemic respiratory failure d/t COVID-19 PNA, ARDS, bilateral pneumothorax s/p CT placement  Intubated  Ventilator synchrony issues, paralytics prn  Possible trach  D-dimer 1.2 (1/28)  Blood Pressure  Variable - 66/31 - 212/118 in past 24h . Permissive hypertension (OK if < 220/120) from stroke standpoint, gradually normalize in 5-7 days . Long-term BP goal  normotensive  Hyperlipidemia  Home meds:  No statin  LDL NOT CALCULATED (Cho 134, HDL 33), goal < 70  Check direct LDL pending   Steroid Induced Hyperglycemia  HgbA1c 11.7, goal < 7.0  Dysphagia At Risk Malnutrition . Secondary to stroke . NPO . TF   Other Stroke Risk Factors  ETOH use, advised to drink no more than 2 drink(s) a day  Overweight, Body mass index is 27.47 kg/m., recommend weight loss, diet and exercise as appropriate   Hospital day # 12  Patient likely has multiple embolic strokes related to Covid related hypercoagulability or vasculitis though cardiac sources are also possible.  Patient's medical condition has declined significantly with refractory respiratory failure hence I do not believe further ongoing stroke work-up is indicated till his condition stabilizes.  We will hold off on MRI scan and checking lower extremity Dopplers and transcranial Doppler bubble study.  Discussed with Dr. Levon Hedger critical care MD.  No family available for discussion even over the phone and after leaving a message.  Continue aspirin for now unless clear-cut evidence of DVT or pulmonary embolism is obtained This patient is critically ill and at significant risk of neurological worsening, death and care requires constant monitoring of vital signs, hemodynamics,respiratory and cardiac monitoring, extensive review of multiple databases, frequent neurological assessment, discussion with family, other specialists and medical decision making of high complexity.I have made any additions or clarifications directly to the above note.This critical care time does not reflect procedure time, or teaching time or supervisory time of PA/NP/Med Resident etc but could involve care discussion time.  I spent 30 minutes of neurocritical care time  in the care of  this patient.      Delia Heady, MD Medical Director [facility] Pager: [phone] 02/18/2019 1:50 PM   To contact  Stroke Continuity provider, please refer to [email or web address]. After hours, contact General Neurology

## 2019-02-18 NOTE — Progress Notes (Signed)
OT Cancellation Note  Patient Details Name: Andrew Bruce MRN: [id number] DOB: 08/09/1965   Cancelled Treatment:    Reason Eval/Treat Not Completed: Other (comment) OT currently signing off due to increasing medical decline. Please reconsult OT as pt medically ready and appropriate to initiate POC.  Dalphine Handing, MSOT, OTR/L Acute Rehabilitation Services [facility] Office Number: [phone]  Dalphine Handing 02/18/2019, 8:35 AM

## 2019-02-18 NOTE — Progress Notes (Signed)
Peripherally Inserted Central Catheter/Midline Placement  The IV Nurse has discussed with the patient and/or persons authorized to consent for the patient, the purpose of this procedure and the potential benefits and risks involved with this procedure.  The benefits include less needle sticks, lab draws from the catheter, and the patient may be discharged home with the catheter. Risks include, but not limited to, infection, bleeding, blood clot (thrombus formation), and puncture of an artery; nerve damage and irregular heartbeat and possibility to perform a PICC exchange if needed/ordered by physician.  Alternatives to this procedure were also discussed.  Bard Power PICC patient education guide, fact sheet on infection prevention and patient information card has been provided to patient /or left at bedside.  Telephone consent obtained from daughter due to altered mental status.  PICC/Midline Placement Documentation        Andrew Bruce, [name] 02/18/2019, 12:01 PM

## 2019-02-18 NOTE — Progress Notes (Signed)
Physical Therapy Discharge Patient Details Name: Andrew Bruce MRN: [id number] DOB: 03/15/1965 Today's Date: 02/18/2019 Time:  -     Patient discharged from PT services secondary to medical decline - will need to re-order PT to resume therapy services. Patient has had CVA since PT ordered. Currently heavily sedated.         GP     Jerolyn Center, PT Pager [phone]   Zena Amos 02/18/2019, 8:26 AM

## 2019-02-18 NOTE — Progress Notes (Signed)
Wasted 20 ml of versed with Brynda Peon, RN.

## 2019-02-18 NOTE — Progress Notes (Signed)
Unable to take patient to MRI due to resperatory insatiability when laying flat for a long period of time. Patient tolerated CTA poorly according to day shift RN. E-link and neuro MD's made aware.

## 2019-02-18 NOTE — Progress Notes (Signed)
[facility] ADULT ICU REPLACEMENT PROTOCOL FOR AM LAB REPLACEMENT ONLY  The patient does apply for the [facility] Adult ICU Electrolyte Replacment Protocol based on the criteria listed below:   1. Is GFR >/= 40 ml/min? Yes.    Patient's GFR today is >60 2. Is urine output >/= 0.5 ml/kg/hr for the last 6 hours? Yes.   Patient's UOP is .7 ml/kg/hr 3. Is BUN < 60 mg/dL? Yes.    Patient's BUN today is 36 4. Abnormal electrolyte(s): 3.45. Ordered repletion with: per protocol 6. If a panic level lab has been reported, has the CCM MD in charge been notified? Yes.  .   Physician:  Dr. Janne Lab, Dixon Boos 02/18/2019 5:52 AM

## 2019-02-19 ENCOUNTER — Inpatient Hospital Stay (HOSPITAL_COMMUNITY): Payer: HRSA Program

## 2019-02-19 DIAGNOSIS — I6310 Cerebral infarction due to embolism of unspecified precerebral artery: Secondary | ICD-10-CM

## 2019-02-19 LAB — CBC
HCT: 33.4 % — ABNORMAL LOW (ref 39.0–52.0)
Hemoglobin: 10.3 g/dL — ABNORMAL LOW (ref 13.0–17.0)
MCH: 28.4 pg (ref 26.0–34.0)
MCHC: 30.8 g/dL (ref 30.0–36.0)
MCV: 92 fL (ref 80.0–100.0)
Platelets: 363 10*3/uL (ref 150–400)
RBC: 3.63 MIL/uL — ABNORMAL LOW (ref 4.22–5.81)
RDW: 13.8 % (ref 11.5–15.5)
WBC: 21.9 10*3/uL — ABNORMAL HIGH (ref 4.0–10.5)
nRBC: 0 % (ref 0.0–0.2)

## 2019-02-19 LAB — BASIC METABOLIC PANEL
Anion gap: 10 (ref 5–15)
BUN: 33 mg/dL — ABNORMAL HIGH (ref 6–20)
CO2: 28 mmol/L (ref 22–32)
Calcium: 7.6 mg/dL — ABNORMAL LOW (ref 8.9–10.3)
Chloride: 96 mmol/L — ABNORMAL LOW (ref 98–111)
Creatinine, Ser: 0.62 mg/dL (ref 0.61–1.24)
GFR calc Af Amer: >60 mL/min (ref >60)
GFR calc non Af Amer: >60 mL/min (ref >60)
Glucose, Bld: 294 mg/dL — ABNORMAL HIGH (ref 70–99)
Potassium: 4 mmol/L (ref 3.5–5.1)
Sodium: 134 mmol/L — ABNORMAL LOW (ref 135–145)

## 2019-02-19 LAB — TRIGLYCERIDES: Triglycerides: 571 mg/dL — ABNORMAL HIGH (ref <150)

## 2019-02-19 LAB — GLUCOSE, CAPILLARY
Glucose-Capillary: 146 mg/dL — ABNORMAL HIGH (ref 70–99)
Glucose-Capillary: 222 mg/dL — ABNORMAL HIGH (ref 70–99)
Glucose-Capillary: 242 mg/dL — ABNORMAL HIGH (ref 70–99)
Glucose-Capillary: 243 mg/dL — ABNORMAL HIGH (ref 70–99)
Glucose-Capillary: 275 mg/dL — ABNORMAL HIGH (ref 70–99)
Glucose-Capillary: 291 mg/dL — ABNORMAL HIGH (ref 70–99)
Glucose-Capillary: 313 mg/dL — ABNORMAL HIGH (ref 70–99)

## 2019-02-19 LAB — PROCALCITONIN: Procalcitonin: 2.11 ng/mL

## 2019-02-19 MED ORDER — INSULIN DETEMIR 100 UNIT/ML ~~LOC~~ SOLN
20.0000 [IU] | Freq: Two times a day (BID) | SUBCUTANEOUS | Status: DC
  Administered 2019-02-19 – 2019-02-20 (×4): 20 [IU] via SUBCUTANEOUS
  Filled 2019-02-19 (×6): qty 0.2

## 2019-02-19 MED ORDER — CHLORHEXIDINE GLUCONATE 0.12 % MT SOLN
OROMUCOSAL | Status: AC
  Administered 2019-02-19: 15 mL
  Filled 2019-02-19: qty 30

## 2019-02-19 MED ORDER — FUROSEMIDE 10 MG/ML IJ SOLN
40.0000 mg | Freq: Four times a day (QID) | INTRAMUSCULAR | Status: AC
  Administered 2019-02-19 (×2): 40 mg via INTRAVENOUS
  Filled 2019-02-19 (×2): qty 4

## 2019-02-19 MED ORDER — SODIUM CHLORIDE 0.9 % IV SOLN
0.5000 ug/kg/min | INTRAVENOUS | Status: DC
  Administered 2019-02-19 – 2019-02-20 (×3): 5 ug/kg/min via INTRAVENOUS
  Filled 2019-02-19 (×3): qty 20

## 2019-02-19 MED ORDER — BISACODYL 10 MG RE SUPP
10.0000 mg | Freq: Once | RECTAL | Status: AC
  Administered 2019-02-19: 10:00:00 10 mg via RECTAL
  Filled 2019-02-19: qty 1

## 2019-02-19 MED ORDER — PROPOFOL 1000 MG/100ML IV EMUL
5.0000 ug/kg/min | INTRAVENOUS | Status: DC
  Administered 2019-02-19 – 2019-02-20 (×4): 40 ug/kg/min via INTRAVENOUS
  Administered 2019-02-20 – 2019-02-21 (×3): 30 ug/kg/min via INTRAVENOUS
  Administered 2019-02-22: 09:00:00 20 ug/kg/min via INTRAVENOUS
  Administered 2019-02-22: 16:00:00 40 ug/kg/min via INTRAVENOUS
  Administered 2019-02-22: 20 ug/kg/min via INTRAVENOUS
  Administered 2019-02-22: 40 ug/kg/min via INTRAVENOUS
  Administered 2019-02-23: 02:00:00 30 ug/kg/min via INTRAVENOUS
  Administered 2019-02-23: 20:00:00 25 ug/kg/min via INTRAVENOUS
  Administered 2019-02-24 (×2): 30 ug/kg/min via INTRAVENOUS
  Administered 2019-02-24 – 2019-02-25 (×2): 25 ug/kg/min via INTRAVENOUS
  Administered 2019-02-25: 50 ug/kg/min via INTRAVENOUS
  Administered 2019-02-25: 25 ug/kg/min via INTRAVENOUS
  Administered 2019-02-25: 20 ug/kg/min via INTRAVENOUS
  Administered 2019-02-25: 50 ug/kg/min via INTRAVENOUS
  Administered 2019-02-26: 25 ug/kg/min via INTRAVENOUS
  Administered 2019-02-26: 50 ug/kg/min via INTRAVENOUS
  Administered 2019-02-26: 30 ug/kg/min via INTRAVENOUS
  Administered 2019-02-26: 50 ug/kg/min via INTRAVENOUS
  Administered 2019-02-27: 15 ug/kg/min via INTRAVENOUS
  Administered 2019-02-27: 30 ug/kg/min via INTRAVENOUS
  Administered 2019-02-27: 10 ug/kg/min via INTRAVENOUS
  Administered 2019-02-28: 30 ug/kg/min via INTRAVENOUS
  Administered 2019-02-28: 15 ug/kg/min via INTRAVENOUS
  Administered 2019-03-01: 35 ug/kg/min via INTRAVENOUS
  Administered 2019-03-01 (×2): 25 ug/kg/min via INTRAVENOUS
  Administered 2019-03-02: 35 ug/kg/min via INTRAVENOUS
  Administered 2019-03-02: 15 ug/kg/min via INTRAVENOUS
  Filled 2019-02-19 (×36): qty 100

## 2019-02-19 MED ORDER — PROPOFOL 1000 MG/100ML IV EMUL: 5.0000 ug/kg/min | INTRAVENOUS | Status: DC

## 2019-02-19 MED ORDER — SENNOSIDES-DOCUSATE SODIUM 8.6-50 MG PO TABS
1.0000 | ORAL_TABLET | Freq: Two times a day (BID) | ORAL | Status: DC
  Administered 2019-02-19 – 2019-02-21 (×5): 1
  Filled 2019-02-19 (×5): qty 1

## 2019-02-19 NOTE — Progress Notes (Signed)
SLP Cancellation Note  Patient Details Name: Andrew Bruce MRN: [id number] DOB: November 01, 1965   Cancelled treatment:       Reason Eval/Treat Not Completed: Patient not medically ready. Will sign off and await new orders.    Tenya Araque, Riley Nearing 02/19/2019, 7:55 AM

## 2019-02-19 NOTE — Progress Notes (Signed)
STROKE TEAM PROGRESS NOTE   INTERVAL HISTORY Patient remains critically ill with refractory resp failure and sedated, intubated, paralysed and requiring pressor support. Family conference planned for this afternoon per CCM team. No neuro changes  Vitals:   02/19/19 1130 02/19/19 1145 02/19/19 1200 02/19/19 1215  BP: (!) 87/59 (!) 87/60 92/60 94/61   Pulse: (!) 106 (!) 108 (!) 105 (!) 105  Resp: (!) 30 (!) 30 (!) 30 (!) 30  Temp:   (!) 101.7 F (38.7 C)   TempSrc:   Oral   SpO2: 91% 91% 90% 90%  Weight:      Height:        CBC:  Recent Labs  Lab 02/18/19 0250 02/19/19 0415  WBC 17.1* 21.9*  NEUTROABS 15.2*  --   HGB 10.9* 10.3*  HCT 35.1* 33.4*  MCV 90.0 92.0  PLT 387 363    Basic Metabolic Panel:  Recent Labs  Lab 02/14/19 0428 02/14/19 1421 02/15/19 0530 02/16/19 0421 02/18/19 0250 02/19/19 0415  NA 142   < > 138   < > 139 134*  K 3.8   < > 3.9   < > 3.4* 4.0  CL 100  --  90*   < > 93* 96*  CO2 31  --  38*   < > 37* 28  GLUCOSE 149*  --  108*   < > 107* 294*  BUN 20  --  25*   < > 36* 33*  CREATININE 0.60*  --  0.66   < > 0.62 0.62  CALCIUM 8.3*  --  8.8*   < > 8.8* 7.6*  MG 2.2  --  2.5*  --   --   --   PHOS  --   --  5.1*  --   --   --    < > = values in this interval not displayed.   Lipid Panel:     Component Value Date/Time   CHOL 134 02/17/2019 0424   TRIG 571 (H) 02/19/2019 1001   HDL 33 (L) 02/17/2019 0424   CHOLHDL 4.1 02/17/2019 0424   VLDL 26 02/17/2019 0424   LDLCALC NOT CALCULATED 02/17/2019 0424   HgbA1c:  Lab Results  Component Value Date   HGBA1C 11.7 (H) 02/17/2019   IMAGING past 48 hours CT ANGIO CHEST PE W OR WO CONTRAST  Result Date: 02/17/2019 CLINICAL DATA:  55 year old male with concern for pulmonary embolism. EXAM: CT ANGIOGRAPHY CHEST WITH CONTRAST TECHNIQUE: Multidetector CT imaging of the chest was performed using the standard protocol during bolus administration of intravenous contrast. Multiplanar CT image  reconstructions and MIPs were obtained to evaluate the vascular anatomy. CONTRAST:  44 OMNIPAQUE IOHEXOL 350 MG/ML SOLN COMPARISON:  Chest radiograph dated 02/17/2019. FINDINGS: Cardiovascular: There is no cardiomegaly. There is trace pericardial effusion. Mild coronary vascular calcification of the LAD. There is mild atherosclerotic calcification of the aorta with calcified and noncalcified plaque of the aortic arch. No aneurysmal dilatation or dissection. There is a pulmonary artery embolism in the left lower lobe segmental branch. No other pulmonary artery embolus identified. There is no CT evidence of right heart straining. Mediastinum/Nodes: Mildly enlarged right hilar lymph nodes measuring up to 16 mm in short axis, likely reactive. An enteric tube is noted within the esophagus. No mediastinal fluid collection. Lungs/Pleura: Background of emphysema and diffuse interstitial coarsening/scarring. Bibasilar and subpleural interstitial coarsening, likely related to fibrosis. Bilateral lower lobe/bibasilar consolidative changes with air bronchograms may represent pneumonia. There is mild lower lobe  predominant bronchiectatic changes. Scattered bilateral ground-glass airspace opacities most consistent with pneumonia, possibly viral or atypical. Bilateral chest tubes with tip along the posterior pleural surfaces noted. There is no pleural effusion or pneumothorax. The central airways are patent. Upper Abdomen: Enteric tube extends into the body of the stomach. Musculoskeletal: No acute osseous pathology. Review of the MIP images confirms the above findings. IMPRESSION: 1. Left lower lobe segmental pulmonary artery embolus. No CT evidence of right heart straining. 2. Multifocal pneumonia. 3. Background of emphysema and probable degree of interstitial fibrosis. 4.  Aortic Atherosclerosis (ICD10-I70.0). These results were called by telephone at the time of interpretation on 02/17/2019 at 7:15 pm to nurse  [facility], who verbally acknowledged these results. Electronically Signed   By: Elgie Collard M.D.   On: 02/17/2019 19:22   DG CHEST PORT 1 VIEW  Result Date: 02/19/2019 CLINICAL DATA:  54 year old male with acute respiratory failure, hypoxia. COVID-19. Pneumonia. EXAM: PORTABLE CHEST 1 VIEW COMPARISON:  02/17/2019 portable chest and earlier. FINDINGS: Portable AP semi upright view at 0458 hours. Endotracheal tube tip visible at the level the clavicles. Enteric tube in place with side hole at the level of the gastric body. A right PICC line is now in place, tip at the level of the cavoatrial junction. Bilateral chest tubes appear stable. Larger lung volumes. Normal cardiac size and mediastinal contours. No pneumothorax identified. Coarse basilar predominant and right peripheral lung interstitial opacity. Lung base ventilation has mildly improved since yesterday. No areas of worsening ventilation. Negative visible bowel gas pattern. IMPRESSION: 1. Right PICC line placed. Otherwise, stable lines and tubes. 2. No pneumothorax identified. Bilateral COVID-19 pneumonia with mildly improved lung base ventilation since yesterday. Electronically Signed   By: Odessa Fleming M.D.   On: 02/19/2019 07:28   [place] EKG SITE RITE  Result Date: 02/18/2019 If Site Rite image not attached, placement could not be confirmed due to current cardiac rhythm.   PHYSICAL EXAM Frail malnourished looking middle-aged male who is sedated intubated and paralyzed. . Afebrile. Head is nontraumatic. Neck is supple without bruit.    Cardiac exam no murmur or gallop. Lungs are clear to auscultation. Distal pulses are well felt. Neurological Exam :  Patient is sedated intubated paralyzed.  Eyes are closed.  Pupils 3 mm sluggishly reactive.  Doll's eye movement extremely sluggish.  Fundi not visualized.  Eyes are in primary position.  Face is symmetric.  Tongue midline.  Motor system exam no response to noxious stimuli including sternal rub and  nailbed pressure.  Tone is diminished bilaterally.  Plantars are not elicitable bilaterally.  ASSESSMENT/PLAN Andrew Bruce is a 54 y.o. male with no significant past medical history admitted to the hospital 1/23 w/ SOB d/t COVID treated with remdesivir, dexamethasone and full dose anticoagulation. Intubated on the 25th. On the 27th, AC was decreased. Continued to need significant sedation for vent synchrony and and proning. Once sedation lifted, noted to not be moving his R side. CT with L brain infarcts.   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 Possible R frontal convexity infarct.   CTA head & neck pending   MRI  pending   Lower extremity Doppler  pending   Transcranial Doppler pending    2D Echo pending   LDL NOT CALCULATED (Cho 134, HDL 33)  HgbA1c 11.7  Lovenox 40 mg sq q 12h  for VTE prophylaxis  No antithrombotic prior to admission, now on aspirin 300 mg suppository daily.   Therapy recommendations:  pending   Disposition:  pending   Acuate hypoxemic respiratory failure d/t COVID-19 PNA, ARDS, bilateral pneumothorax s/p CT placement  Intubated  Ventilator synchrony issues, paralytics prn  Possible trach  D-dimer 1.2 (1/28)  Blood Pressure  Variable - 66/31 - 212/118 in past 24h . Permissive hypertension (OK if < 220/120) from stroke standpoint, gradually normalize in 5-7 days . Long-term BP goal normotensive  Hyperlipidemia  Home meds:  No statin  LDL NOT CALCULATED (Cho 134, HDL 33), goal < 70  Check direct LDL pending   Steroid Induced Hyperglycemia  HgbA1c 11.7, goal < 7.0  Dysphagia At Risk Malnutrition . Secondary to stroke . NPO . TF   Other Stroke Risk Factors  ETOH use, advised to drink no more than 2 drink(s) a day  Overweight, Body mass index is 27.68 kg/m.,  recommend weight loss, diet and exercise as appropriate   Hospital day # 13  Patient likely has multiple embolic strokes related to Covid related hypercoagulability or vasculitis though cardiac sources are also possible.  Patient's medical condition has declined significantly with refractory respiratory failure hence I do not believe further ongoing stroke work-up is indicated till his condition stabilizes.  His situation does not appear to be improving and discussion about goals of care and comfort care would be appropriate.  Discussed with Dr. Ina Homes critical care MD.  Await family meeting later today.e.  Continue aspirin for now unless clear-cut evidence of DVT or pulmonary embolism is obtained This patient is critically ill and at significant risk of neurological worsening, death and care requires constant monitoring of vital signs, hemodynamics,respiratory and cardiac monitoring, extensive review of multiple databases, frequent neurological assessment, discussion with family, other specialists and medical decision making of high complexity.I have made any additions or clarifications directly to the above note.This critical care time does not reflect procedure time, or teaching time or supervisory time of PA/NP/Med Resident etc but could involve care discussion time.  I spent 30 minutes of neurocritical care time  in the care of  this patient.      Antony Contras, MD Medical Director [facility] Pager: [phone] 02/19/2019 12:41 PM   To contact Stroke Continuity provider, please refer to [email or web address] After hours, contact General Neurology

## 2019-02-19 NOTE — Progress Notes (Signed)
Found unopened Oxycodone in room. Pyxis count correct. Wasted 5 mg of Oxycodone in sink with Burman Blacksmith, RN.

## 2019-02-19 NOTE — Progress Notes (Signed)
Assisted tele visit to patient with daughter.  Quaneisha Hanisch Samson, RN  

## 2019-02-19 NOTE — Progress Notes (Signed)
Inpatient Diabetes Program Recommendations  AACE/ADA: New Consensus Statement on Inpatient Glycemic Control (2015)  Target Ranges:  Prepandial:   less than 140 mg/dL      Peak postprandial:   less than 180 mg/dL (1-2 hours)      Critically ill patients:  140 - 180 mg/dL   Lab Results  Component Value Date   GLUCAP 242 (H) 02/19/2019   HGBA1C 11.7 (H) 02/17/2019    Review of Glycemic Control Results for Andrew Bruce, Andrew Bruce (MRN [id number]) as of 02/19/2019 09:32  Ref. Range 02/18/2019 07:48 02/18/2019 11:15 02/18/2019 16:15 02/18/2019 19:46 02/19/2019 00:09 02/19/2019 04:11 02/19/2019 07:29  Glucose-Capillary Latest Ref Range: 70 - 99 mg/dL 005 (H) 259 (H) 102 (H) 93 275 (H) 291 (H) 242 (H)   Diabetes history: DM 2 Outpatient Diabetes medications:  Current orders for Inpatient glycemic control:  Levemir 20 units bid Novolog 0-20 units Q4 hours Novolog 2 units Q4 hours Tube Feed Coverage  Vital AF 50 ml/hour Steroids: None  Inpatient Diabetes Program Recommendations:    Noted Levemir increased to 20 units bid.  If glucose trends continue to increase increase Novolog Tube feed coverage to 6 units Q4 hours.  Thanks,  Christena Deem RN, MSN, BC-ADM Inpatient Diabetes Coordinator Team Pager [phone] (8a-5p)

## 2019-02-19 NOTE — Progress Notes (Addendum)
NAME:  Andrew Bruce, MRN:  [id number], DOB:  [birth year], LOS: 26 ADMISSION DATE:  02/06/2019, CONSULTATION DATE:  02/06/19 REFERRING MD:  ER, CHIEF COMPLAINT:  SOB   Brief History   [name] without a past medical history admitted 1/23 with 48 hours of worsening shortness of breath in the context of a 10-day Covid illness (dry cough, lost taste/smell).  Chest x-ray and vitals are consistent with a worsening COVID ARDS.  Admitted for observation.  Developed worsening shortness of breath and increasing O2 needs. Decompensated requiring intubation 1/24.  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   Past Medical History  None known prior to admit   Significant Hospital Events   1/23 Admit  1/24 Decompensated, tx to ICU, intubated   Consults:  Neurology   Procedures:  ETT 1/24 >> Bilateral chest tube placement 1/25 >> R Femoral TLC 1/25 >>   Significant Diagnostic Tests:  CT Head 2/2 >> no acute intracranial hemorrhage, large area of infarct in the left occipital lobe, likely subacute CTA Chest 2/3 >> LLL segmental PE, no CT evidence of right heart strain, multifocal PNA, background emphysema and probable degree of interstitial fibrosis ECHO 2/3 >> LVEF 55-60%, no WMA, hypokinetic right ventricular free wall, McConnells sign present.  RV findings consistent with acute cor pulmonale, small pericardial effusion, mildly elevated pulmonary artery systolic pressure  Micro Data:  COVID 1/12 >> positive  PCT 1/23 >> Neg  Antimicrobials:  Remdesivir 02/06/19>>1/28 Dexamethasone 02/06/19>>2/2   Interim history/subjective:  Febrile this AM. Remains intubated and paralyzed. O2 requirements improved a bit.  Objective   Blood pressure 106/71, pulse (!) 103, temperature (!) 100.8 F (38.2 C), resp. rate (!) 30, height 5\' 6"  (1.676 m), weight 77.8 kg, SpO2 95 %.    Vent Mode: PCV FiO2 (%):  [60 %-80 %] 60 % Set Rate:  [30 bmp]  30 bmp PEEP:  [15 cmH20] 15 cmH20 Plateau Pressure:  [35 cmH20-36 cmH20] 35 cmH20   Intake/Output Summary (Last 24 hours) at 02/19/2019 0831 Last data filed at 02/19/2019 0800 Gross per 24 hour  Intake 4001.03 ml  Output 1140 ml  Net 2861.03 ml   Filed Weights   02/17/19 0425 02/18/19 0319 02/19/19 0500  Weight: 76.4 kg 77.2 kg 77.8 kg   Examination:  General: critically ill appearing adult male lying in bed on vent   HEENT: MM pink/moist, ETT Neuro: sedate / paralyzed on NMB CV: s1s2 RRR, no m/r/g PULM:  Non-labored, vent assisted breath sounds, bilateral chest tubes in place GI: soft, bsx4 active  Extremities: warm/dry, trace to 1+ generalized edema, R PICC CDI Skin: no rashes or lesions  Resolved Hospital Problem list   Oliguria, hypernatremia  Assessment & Plan:   Acute hypoxemic respiratory failure due to COVID-19 pneumonia, ARDS,  -low Vt ventilation 4-8cc/kg -goal plateau pressure <30, driving pressure <50 cm H2O -target PaO2 55-65, titrate PEEP/FiO2 per ARDS protocol  -hold further prone therapy at this time  -VAP prevention measures  -follow intermittent CXR   Today:  -if we can get FiO2 less than 0.6, will wean off paralytic -Current sedation regimen: oral oxycodone, klonipin, gabapentin.  IV fentanyl, propofol.  Goal BIS < 60.  Check TG. -Lasix x 2 -Probably needs trach at some point  Hypotension  Suspect element of sedation.  Afebrile. 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   - continue full dose lovenox - high risk  for embolizing but going after it is more risky at this time  Left Occipital and R frontal CVA Suspected embolic in nature in setting of COVID.     -hold MRI for now given respiratory instability and that this will not change management -appreciate Neurology input -continue ASA   COVID-19 pneumonia/ARDS Completed remdesivir, dexamethasone. -continue full dose lovenox   Bilateral  pneumothoraces -Chest tube to suction until extubated -No air leak at present -Limit mean airway pressure as able  Pulmonary embolism- segmental on CTA chest 2/3 ordered as part of abnormal echo - Continue full dose lovenox  RV dysfunction- related to ARDS and PE  Steroid-induced Hyperglycemia -SSI, resistant scale  -continue levemir 25 units BID  -2 units TF coverage Q4 Today: will discuss adjusting regimen with PharmD  At Risk Malnutrition -continue TF   Constipation Bowel regimen strengthened (2/5)  Fever, worsening leukocytosis - Tracheal aspirate, blood cx, Pct, low threshold for HCAP coverage  Prolonged Multiorgan dysfunction- discussed guarded prognosis with family multiple times, they want to push through  Best practice:  Diet: NPO, TF Pain/Anxiety/Delirium protocol (if indicated): in place VAP protocol (if indicated): Ordered DVT prophylaxis: lovenox GI prophylaxis: Pepcid Glucose control: SSI and Levemir Mobility: Bedrest Code Status: Full Code  Family Communication: will reach out today Disposition: ICU  Labs   CBC: Recent Labs  Lab 02/15/19 0530 02/16/19 0421 02/17/19 0424 02/18/19 0250 02/19/19 0415  WBC 12.6* 14.4* 18.9* 17.1* 21.9*  NEUTROABS  --   --   --  15.2*  --   HGB 12.6* 11.7* 11.7* 10.9* 10.3*  HCT 40.2 36.4* 37.0* 35.1* 33.4*  MCV 88.9 86.9 87.9 90.0 92.0  PLT 425* 385 420* 387 363    Basic Metabolic Panel: Recent Labs  Lab 02/13/19 0551 02/13/19 2023 02/14/19 0428 02/14/19 1421 02/15/19 0530 02/16/19 0421 02/17/19 0424 02/18/19 0250 02/19/19 0415  NA 133*   < > 142   < > 138 136 132* 139 134*  K 3.8   < > 3.8   < > 3.9 3.3* 3.6 3.4* 4.0  CL 97*  --  100  --  90* 89* 93* 93* 96*  CO2 29  --  31  --  38* 36* 31 37* 28  GLUCOSE 209*  --  149*  --  108* 166* 235* 107* 294*  BUN 20  --  20  --  25* 49* 31* 36* 33*  CREATININE 0.65  --  0.60*  --  0.66 1.03 0.68 0.62 0.62  CALCIUM 7.5*  --  8.3*  --  8.8* 8.3* 8.4* 8.8*  7.6*  MG 1.9  --  2.2  --  2.5*  --   --   --   --   PHOS  --   --   --   --  5.1*  --   --   --   --    < > = values in this interval not displayed.   GFR: Estimated Creatinine Clearance: 106 mL/min (by C-G formula based on SCr of 0.62 mg/dL). Recent Labs  Lab 02/16/19 0421 02/17/19 0424 02/18/19 0250 02/19/19 0415  WBC 14.4* 18.9* 17.1* 21.9*    Liver Function Tests: No results for input(s): AST, ALT, ALKPHOS, BILITOT, PROT, ALBUMIN in the last 168 hours. No results for input(s): LIPASE, AMYLASE in the last 168 hours. No results for input(s): AMMONIA in the last 168 hours.  ABG    Component Value Date/Time   PHART 7.496 (H) 02/15/2019 3419  PCO2ART 53.3 (H) 02/15/2019 0349   PO2ART 74.0 (L) 02/15/2019 0349   HCO3 41.2 (H) 02/15/2019 0349   TCO2 43 (H) 02/15/2019 0349   ACIDBASEDEF 2.0 02/08/2019 1444   O2SAT 95.0 02/15/2019 0349     Coagulation Profile: No results for input(s): INR, PROTIME in the last 168 hours.  Cardiac Enzymes: No results for input(s): CKTOTAL, CKMB, CKMBINDEX, TROPONINI in the last 168 hours.  HbA1C: Hgb A1c MFr Bld  Date/Time Value Ref Range Status  02/17/2019 04:25 AM 11.7 (H) 4.8 - 5.6 % Final    Comment:    (NOTE) Pre diabetes:          5.7%-6.4% Diabetes:              >6.4% Glycemic control for   <7.0% adults with diabetes   02/06/2019 06:54 AM 12.6 (H) 4.8 - 5.6 % Final    Comment:    (NOTE) Pre diabetes:          5.7%-6.4% Diabetes:              >6.4% Glycemic control for   <7.0% adults with diabetes     CBG: Recent Labs  Lab 02/18/19 1615 02/18/19 1946 02/19/19 0009 02/19/19 0411 02/19/19 0729  GLUCAP 249* 93 275* 291* 242*      The patient is critically ill with multiple organ systems failure and requires high complexity decision making for assessment and support, frequent evaluation and titration of therapies, application of advanced monitoring technologies and extensive interpretation of multiple databases.  Critical Care Time devoted to patient care services described in this note independent of APP/resident time (if applicable)  is 32 minutes.   Myrla Halsted MD [facility] 02/19/2019 8:49 AM Personal pager: [phone] If unanswered, please page CCM On-call: #[phone]

## 2019-02-20 ENCOUNTER — Inpatient Hospital Stay (HOSPITAL_COMMUNITY): Payer: HRSA Program

## 2019-02-20 DIAGNOSIS — E781 Pure hyperglyceridemia: Secondary | ICD-10-CM

## 2019-02-20 DIAGNOSIS — J181 Lobar pneumonia, unspecified organism: Secondary | ICD-10-CM

## 2019-02-20 DIAGNOSIS — I2693 Single subsegmental pulmonary embolism without acute cor pulmonale: Secondary | ICD-10-CM

## 2019-02-20 LAB — GLUCOSE, CAPILLARY
Glucose-Capillary: 132 mg/dL — ABNORMAL HIGH (ref 70–99)
Glucose-Capillary: 147 mg/dL — ABNORMAL HIGH (ref 70–99)
Glucose-Capillary: 187 mg/dL — ABNORMAL HIGH (ref 70–99)
Glucose-Capillary: 201 mg/dL — ABNORMAL HIGH (ref 70–99)
Glucose-Capillary: 204 mg/dL — ABNORMAL HIGH (ref 70–99)
Glucose-Capillary: 260 mg/dL — ABNORMAL HIGH (ref 70–99)

## 2019-02-20 LAB — POCT I-STAT 7, (LYTES, BLD GAS, ICA,H+H)
Acid-Base Excess: 10 mmol/L — ABNORMAL HIGH (ref 0.0–2.0)
Bicarbonate: 36.9 mmol/L — ABNORMAL HIGH (ref 20.0–28.0)
Calcium, Ion: 1.18 mmol/L (ref 1.15–1.40)
HCT: 31 % — ABNORMAL LOW (ref 39.0–52.0)
Hemoglobin: 10.5 g/dL — ABNORMAL LOW (ref 13.0–17.0)
O2 Saturation: 93 %
Patient temperature: 98
Potassium: 3.4 mmol/L — ABNORMAL LOW (ref 3.5–5.1)
Sodium: 139 mmol/L (ref 135–145)
TCO2: 39 mmol/L — ABNORMAL HIGH (ref 22–32)
pCO2 arterial: 61.8 mmHg — ABNORMAL HIGH (ref 32.0–48.0)
pH, Arterial: 7.382 (ref 7.350–7.450)
pO2, Arterial: 71 mmHg — ABNORMAL LOW (ref 83.0–108.0)

## 2019-02-20 LAB — BASIC METABOLIC PANEL
Anion gap: 10 (ref 5–15)
BUN: 34 mg/dL — ABNORMAL HIGH (ref 6–20)
CO2: 32 mmol/L (ref 22–32)
Calcium: 8 mg/dL — ABNORMAL LOW (ref 8.9–10.3)
Chloride: 97 mmol/L — ABNORMAL LOW (ref 98–111)
Creatinine, Ser: 0.71 mg/dL (ref 0.61–1.24)
GFR calc Af Amer: >60 mL/min (ref >60)
GFR calc non Af Amer: >60 mL/min (ref >60)
Glucose, Bld: 148 mg/dL — ABNORMAL HIGH (ref 70–99)
Potassium: 3.5 mmol/L (ref 3.5–5.1)
Sodium: 139 mmol/L (ref 135–145)

## 2019-02-20 LAB — CBC
HCT: 29.6 % — ABNORMAL LOW (ref 39.0–52.0)
Hemoglobin: 9.4 g/dL — ABNORMAL LOW (ref 13.0–17.0)
MCH: 28.3 pg (ref 26.0–34.0)
MCHC: 31.8 g/dL (ref 30.0–36.0)
MCV: 89.2 fL (ref 80.0–100.0)
Platelets: 434 10*3/uL — ABNORMAL HIGH (ref 150–400)
RBC: 3.32 MIL/uL — ABNORMAL LOW (ref 4.22–5.81)
RDW: 14.1 % (ref 11.5–15.5)
WBC: 20.5 10*3/uL — ABNORMAL HIGH (ref 4.0–10.5)
nRBC: 0 % (ref 0.0–0.2)

## 2019-02-20 LAB — LIPID PANEL
Cholesterol: 134 mg/dL (ref 0–200)
HDL: 33 mg/dL — ABNORMAL LOW (ref >40)
LDL Cholesterol: 75 mg/dL (ref 0–99)
Total CHOL/HDL Ratio: 4.1 RATIO
Triglycerides: 129 mg/dL (ref <150)
VLDL: 26 mg/dL (ref 0–40)

## 2019-02-20 LAB — MAGNESIUM: Magnesium: 2.5 mg/dL — ABNORMAL HIGH (ref 1.7–2.4)

## 2019-02-20 LAB — TRIGLYCERIDES: Triglycerides: 700 mg/dL — ABNORMAL HIGH (ref <150)

## 2019-02-20 LAB — PHOSPHORUS: Phosphorus: 3.4 mg/dL (ref 2.5–4.6)

## 2019-02-20 MED ORDER — INSULIN ASPART 100 UNIT/ML ~~LOC~~ SOLN
2.0000 [IU] | SUBCUTANEOUS | Status: DC
  Administered 2019-02-20 – 2019-03-16 (×89): 2 [IU] via SUBCUTANEOUS

## 2019-02-20 MED ORDER — CHLORHEXIDINE GLUCONATE 0.12 % MT SOLN
OROMUCOSAL | Status: AC
  Filled 2019-02-20: qty 15

## 2019-02-20 MED ORDER — PIPERACILLIN-TAZOBACTAM 3.375 G IVPB
3.3750 g | Freq: Three times a day (TID) | INTRAVENOUS | Status: DC
  Administered 2019-02-20 – 2019-02-22 (×9): 3.375 g via INTRAVENOUS
  Filled 2019-02-20 (×10): qty 50

## 2019-02-20 MED ORDER — VANCOMYCIN HCL 1250 MG/250ML IV SOLN
1250.0000 mg | Freq: Two times a day (BID) | INTRAVENOUS | Status: AC
  Administered 2019-02-20 – 2019-02-26 (×13): 1250 mg via INTRAVENOUS
  Filled 2019-02-20 (×15): qty 250

## 2019-02-20 MED ORDER — QUETIAPINE FUMARATE 50 MG PO TABS
50.0000 mg | ORAL_TABLET | Freq: Two times a day (BID) | ORAL | Status: DC
  Administered 2019-02-20 – 2019-03-04 (×25): 50 mg
  Filled 2019-02-20 (×26): qty 1

## 2019-02-20 MED ORDER — VANCOMYCIN HCL 1500 MG/300ML IV SOLN
1500.0000 mg | Freq: Once | INTRAVENOUS | Status: AC
  Administered 2019-02-20: 09:00:00 1500 mg via INTRAVENOUS
  Filled 2019-02-20: qty 300

## 2019-02-20 NOTE — Progress Notes (Signed)
STROKE TEAM PROGRESS NOTE   INTERVAL HISTORY Patient remains critically ill with refractory resp failure and sedated, intubated, paralysed and requiring pressor support. Family conference happened yesterday afternoon per CCM team but patient remains a full code with plans to reassess goals of care on Monday.. No neuro changes.  Vital signs stable.  Vitals:   02/20/19 0800 02/20/19 0900 02/20/19 0945 02/20/19 1000  BP: 96/64 112/68    Pulse: 100 (!) 110  (!) 111  Resp: (!) 30 (!) 30  (!) 30  Temp:      TempSrc:      SpO2: 93% 94% 91% 92%  Weight:      Height:        CBC:  Recent Labs  Lab 02/18/19 0250 02/18/19 0250 02/19/19 0415 02/19/19 0415 02/20/19 0550 02/20/19 0928  WBC 17.1*   < > 21.9*  --  20.5*  --   NEUTROABS 15.2*  --   --   --   --   --   HGB 10.9*   < > 10.3*   < > 9.4* 10.5*  HCT 35.1*   < > 33.4*   < > 29.6* 31.0*  MCV 90.0   < > 92.0  --  89.2  --   PLT 387   < > 363  --  434*  --    < > = values in this interval not displayed.    Basic Metabolic Panel:  Recent Labs  Lab 02/15/19 0530 02/16/19 0421 02/19/19 0415 02/19/19 0415 02/20/19 0550 02/20/19 0928  NA 138   < > 134*   < > 139 139  K 3.9   < > 4.0   < > 3.5 3.4*  CL 90*   < > 96*  --  97*  --   CO2 38*   < > 28  --  32  --   GLUCOSE 108*   < > 294*  --  148*  --   BUN 25*   < > 33*  --  34*  --   CREATININE 0.66   < > 0.62  --  0.71  --   CALCIUM 8.8*   < > 7.6*  --  8.0*  --   MG 2.5*  --   --   --  2.5*  --   PHOS 5.1*  --   --   --  3.4  --    < > = values in this interval not displayed.   Lipid Panel:     Component Value Date/Time   CHOL 134 02/17/2019 0424   TRIG 700 (H) 02/20/2019 0550   HDL 33 (L) 02/17/2019 0424   CHOLHDL 4.1 02/17/2019 0424   VLDL 26 02/17/2019 0424   LDLCALC 75 02/17/2019 0424   HgbA1c:  Lab Results  Component Value Date   HGBA1C 11.7 (H) 02/17/2019   IMAGING past 48 hours DG Chest Port 1 View  Result Date: 02/20/2019 CLINICAL DATA:  Respiratory  distress. EXAM: PORTABLE CHEST 1 VIEW COMPARISON:  February 19, 2019. FINDINGS: The heart size and mediastinal contours are within normal limits. Endotracheal and nasogastric tubes are unchanged in position. Right-sided PICC line is unchanged. No pneumothorax or significant pleural effusion is noted. Bilateral chest tubes are noted. Stable bilateral lung opacities are noted most consistent with multifocal pneumonia. The visualized skeletal structures are unremarkable. IMPRESSION: Stable support apparatus. Stable bilateral lung opacities are noted most consistent with multifocal pneumonia. Stable bilateral chest tubes are noted without pneumothorax. Electronically  Signed   By: Lupita Raider M.D.   On: 02/20/2019 10:38   DG CHEST PORT 1 VIEW  Result Date: 02/19/2019 CLINICAL DATA:  54 year old male with acute respiratory failure, hypoxia. COVID-19. Pneumonia. EXAM: PORTABLE CHEST 1 VIEW COMPARISON:  02/17/2019 portable chest and earlier. FINDINGS: Portable AP semi upright view at 0458 hours. Endotracheal tube tip visible at the level the clavicles. Enteric tube in place with side hole at the level of the gastric body. A right PICC line is now in place, tip at the level of the cavoatrial junction. Bilateral chest tubes appear stable. Larger lung volumes. Normal cardiac size and mediastinal contours. No pneumothorax identified. Coarse basilar predominant and right peripheral lung interstitial opacity. Lung base ventilation has mildly improved since yesterday. No areas of worsening ventilation. Negative visible bowel gas pattern. IMPRESSION: 1. Right PICC line placed. Otherwise, stable lines and tubes. 2. No pneumothorax identified. Bilateral COVID-19 pneumonia with mildly improved lung base ventilation since yesterday. Electronically Signed   By: Odessa Fleming M.D.   On: 02/19/2019 07:28     PHYSICAL EXAM Frail malnourished looking middle-aged male who is sedated intubated and paralyzed. . Afebrile. Head is  nontraumatic. Neck is supple without bruit.    Cardiac exam no murmur or gallop. Lungs are clear to auscultation. Distal pulses are well felt. Neurological Exam :  Patient is sedated intubated paralyzed.  Eyes are closed.  Pupils 3 mm sluggishly reactive.  Doll's eye movement extremely sluggish.  Fundi not visualized.  Eyes are in primary position.  Face is symmetric.  Tongue midline.  Motor system exam no response to noxious stimuli including sternal rub and nailbed pressure.  Tone is diminished bilaterally.  Plantars are not elicitable bilaterally.  ASSESSMENT/PLAN [name] is a 54 y.o. male with no significant past medical history admitted to the hospital 1/23 w/ SOB d/t COVID treated with remdesivir, dexamethasone and full dose anticoagulation. Intubated on the 25th. On the 27th, AC was decreased. Continued to need significant sedation for vent synchrony and proning. Once sedation lifted, noted to not be moving his R side. CT with L brain infarcts.   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 Possible R frontal convexity infarct.   CTA head & neck - not yet ordered  MRI - not yet ordered  Lower extremity Doppler - not yet ordered  Transcranial Doppler - not yet ordered  2D Echo - EF 55 - 60%. No cardiac source of emboli identified.   LDL NOT CALCULATED (Cho 134, HDL 33)  HgbA1c 11.7  Lovenox 40 mg sq q 12h for VTE prophylaxis  No antithrombotic prior to admission, now on aspirin 300 mg suppository daily.   Therapy recommendations:  pending   Disposition:  pending   Acuate hypoxemic respiratory failure d/t COVID-19 PNA, ARDS, bilateral pneumothorax s/p CT placement  Intubated  Ventilator synchrony issues, paralytics prn  Possible trach  D-dimer 1.2 (1/28)  Blood Pressure  Variable - 66/31 -  212/118 in past 24h . Permissive hypertension (OK if < 220/120) from stroke standpoint, gradually normalize in 5-7 days . Long-term BP goal normotensive  Hyperlipidemia  Home meds:  No statin  LDL NOT CALCULATED (Cho 134, HDL 33), goal < 70  Check direct LDL pending   Steroid Induced Hyperglycemia  HgbA1c 11.7, goal < 7.0  Dysphagia At Risk Malnutrition . Secondary to  stroke . NPO . TF   Other Stroke Risk Factors  ETOH use, advised to drink no more than 2 drink(s) a day  Overweight, Body mass index is 27.97 kg/m., recommend weight loss, diet and exercise as appropriate   Code Status - Full code   Hospital day # 14  Patient likely has multiple embolic strokes related to Covid related hypercoagulability or vasculitis though cardiac sources are also possible.  Patient's medical condition has declined significantly with refractory respiratory failure hence I do not believe further ongoing stroke work-up is indicated till his condition stabilizes.  His situation does not appear to be improving but family has decided to continue ongoing care for the moment and will reassess decision on goals of care on Monday.  Continue aspirin for now unless clear-cut evidence of DVT or pulmonary embolism is obtained This patient is critically ill and at significant risk of neurological worsening, death and care requires constant monitoring of vital signs, hemodynamics,respiratory and cardiac monitoring, extensive review of multiple databases, frequent neurological assessment, discussion with family, other specialists and medical decision making of high complexity.I have made any additions or clarifications directly to the above note.This critical care time does not reflect procedure time, or teaching time or supervisory time of PA/NP/Med Resident etc but could involve care discussion time.  I spent 30 minutes of neurocritical care time  in the care of  this patient. Delia Heady, MD     To contact  Stroke Continuity provider, please refer to [email or web address]. After hours, contact General Neurology

## 2019-02-20 NOTE — Progress Notes (Signed)
NAME:  Andrew Bruce, MRN:  [id number], DOB:  10/12/1965, LOS: 14 ADMISSION DATE:  02/06/2019, CONSULTATION DATE:  02/06/19 REFERRING MD:  ER, CHIEF COMPLAINT:  SOB   Brief History   [name] without a past medical history admitted 1/23 with 48 hours of worsening shortness of breath in the context of a 10-day Covid illness (dry cough, lost taste/smell).  Chest x-ray and vitals are consistent with a worsening COVID ARDS.  Admitted for observation.  Developed worsening shortness of breath and increasing O2 needs. Decompensated requiring intubation 1/24.  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   Past Medical History  None known prior to admit   Significant Hospital Events   1/23 Admit  1/24 Decompensated, tx to ICU, intubated   Consults:  Neurology   Procedures:  ETT 1/24 >> Bilateral chest tube placement 1/25 >> R Femoral TLC 1/25 >>   Significant Diagnostic Tests:  CT Head 2/2 >> no acute intracranial hemorrhage, large area of infarct in the left occipital lobe, likely subacute CTA Chest 2/3 >> LLL segmental PE, no CT evidence of right heart strain, multifocal PNA, background emphysema and probable degree of interstitial fibrosis ECHO 2/3 >> LVEF 55-60%, no WMA, hypokinetic right ventricular free wall, McConnells sign present.  RV findings consistent with acute cor pulmonale, small pericardial effusion, mildly elevated pulmonary artery systolic pressure  Micro Data:  COVID 1/12 >> positive  PCT 1/23 >> Neg  Antimicrobials:  Remdesivir 02/06/19>>1/28 Dexamethasone 02/06/19>>2/2   Interim history/subjective:  Fever overnight. Heavily sedated, paralyzed.  Objective   Blood pressure 106/64, pulse (!) 113, temperature 99.9 F (37.7 C), temperature source Axillary, resp. rate (!) 25, height 5\' 6"  (1.676 m), weight 78.6 kg, SpO2 94 %.    Vent Mode: PRVC FiO2 (%):  [50 %] 50 % Set Rate:  [30 bmp] 30 bmp Vt Set:   [380 mL] 380 mL PEEP:  [12 cmH20-15 cmH20] 12 cmH20 Plateau Pressure:  [22 cmH20-35 cmH20] 23 cmH20   Intake/Output Summary (Last 24 hours) at 02/20/2019 1340 Last data filed at 02/20/2019 0700 Gross per 24 hour  Intake 2612.04 ml  Output 1555 ml  Net 1057.04 ml   Filed Weights   02/18/19 0319 02/19/19 0500 02/20/19 0453  Weight: 77.2 kg 77.8 kg 78.6 kg   Examination:  General: Critically ill middle-aged man intubated and sedated HEENT: [place]/AT, eyes anicteric, oral mucosa moist Neuro: RASS -5, under neuromuscular blockade.  Pupils reactive CV: Regular rate and rhythm, no murmurs PULM: Breathing passively on the vent, clear to auscultation bilaterally.  Bilateral chest tubes in place, large clot burden in the right chest tube at the level of the connection. GI: Soft, nontender, nondistended Extremities: Warm and dry, no peripheral edema Skin: No rashes or wounds  CXR today-continue bilateral opacities, likely retrocardiac opacity with air bronchograms.  Resolved Hospital Problem list   Oliguria, hypernatremia  Assessment & Plan:   Acute hypoxemic respiratory failure due to COVID-19 pneumonia, ARDS, PE.  Likely acute left lower lobe pneumonia. -Continue low tidal volume ventilation, 48 cc/kg ideal body weight with goal plateau less than 30 and driving pressure less than 15.  Titrate PEEP and FiO2 per RT protocol. -Stop Nimbex today.  Continue heavy sedation and monitor ventilatory requirements. -Continue to follow respiratory cultures.  Starting vancomycin and Zosyn for presumed left lower lobe pneumonia. -VAP prevention protocol -Fentanyl and propofol for sedation.  Decrease propofol dose due to elevated triglycerides. -Likely will need a trach soon, but  vent requirements remain elevated -Completed remdesivir and dexamethasone  Hypotension  Suspect element of sedation.  Afebrile. -Vasopressors as required to maintain MAP greater than 65   Aortic Clot - adherent to wall Noted  on CTA chest 2/3. High risk for embolizing but intervention is more risky at this time. -Continue full dose Lovenox  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     -Continue aspirin -Continue anticoagulation -Continue to monitor, although neuro exam is limited with neuromuscular blockade and sedation requirements  Bilateral pneumothoraces -Chest tube to suction until extubated.  Connection and distal tubing changed today on the right. 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 They wish to continue all aggressive care measures.  His wife does not feel that his daughters have been able to adequately keep her updated on his course, and she would prefer to be updated daily via Nurse, learning disability.  She understands that it may not be possible to speak to him multiple family members every day, and she will keep other family members updated.  She understands his guarded prognosis.  Best practice:  Diet: NPO, TF Pain/Anxiety/Delirium protocol (if indicated): in place VAP protocol (if indicated): Ordered DVT prophylaxis: lovenox GI prophylaxis: Pepcid Glucose control: SSI and Levemir Mobility: Bedrest Code  Status: Full Code  Family Communication: Updated wife today Disposition: ICU  Labs   CBC: Recent Labs  Lab 02/16/19 0421 02/16/19 0421 02/17/19 0424 02/18/19 0250 02/19/19 0415 02/20/19 0550 02/20/19 0928  WBC 14.4*  --  18.9* 17.1* 21.9* 20.5*  --   NEUTROABS  --   --   --  15.2*  --   --   --   HGB 11.7*   < > 11.7* 10.9* 10.3* 9.4* 10.5*  HCT 36.4*   < > 37.0* 35.1* 33.4* 29.6* 31.0*  MCV 86.9  --  87.9 90.0 92.0 89.2  --   PLT 385  --  420* 387 363 434*  --    < > = values in this interval not displayed.    Basic Metabolic Panel: Recent Labs  Lab 02/14/19 0428 02/14/19 1421 02/15/19 0530 02/15/19 0530 02/16/19 0421 02/16/19 0421 02/17/19 0424 02/18/19 0250 02/19/19 0415 02/20/19 0550 02/20/19 0928  NA 142   < > 138   < > 136   < > 132* 139 134* 139 139  K 3.8   < > 3.9   < > 3.3*   < > 3.6 3.4* 4.0 3.5 3.4*  CL 100  --  90*   < > 89*  --  93* 93* 96* 97*  --   CO2 31  --  38*   < > 36*  --  31 37* 28 32  --   GLUCOSE 149*  --  108*   < > 166*  --  235* 107*  294* 148*  --   BUN 20  --  25*   < > 49*  --  31* 36* 33* 34*  --   CREATININE 0.60*  --  0.66   < > 1.03  --  0.68 0.62 0.62 0.71  --   CALCIUM 8.3*  --  8.8*   < > 8.3*  --  8.4* 8.8* 7.6* 8.0*  --   MG 2.2  --  2.5*  --   --   --   --   --   --  2.5*  --   PHOS  --   --  5.1*  --   --   --   --   --   --  3.4  --    < > = values in this interval not displayed.   GFR: Estimated Creatinine Clearance: 106.5 mL/min (by C-G formula based on SCr of 0.71 mg/dL). Recent Labs  Lab 02/17/19 0424 02/18/19 0250 02/19/19 0415 02/19/19 1001 02/20/19 0550  PROCALCITON  --   --   --  2.11  --   WBC 18.9* 17.1* 21.9*  --  20.5*    Liver Function Tests: No results for input(s): AST, ALT, ALKPHOS, BILITOT, PROT, ALBUMIN in the last 168 hours. No results for input(s): LIPASE, AMYLASE in the last 168 hours. No results for input(s): AMMONIA in the last 168 hours.  ABG    Component Value Date/Time   PHART  7.382 02/20/2019 0928   PCO2ART 61.8 (H) 02/20/2019 0928   PO2ART 71.0 (L) 02/20/2019 0928   HCO3 36.9 (H) 02/20/2019 0928   TCO2 39 (H) 02/20/2019 0928   ACIDBASEDEF 2.0 02/08/2019 1444   O2SAT 93.0 02/20/2019 0928     Coagulation Profile: No results for input(s): INR, PROTIME in the last 168 hours.  Cardiac Enzymes: No results for input(s): CKTOTAL, CKMB, CKMBINDEX, TROPONINI in the last 168 hours.  HbA1C: Hgb A1c MFr Bld  Date/Time Value Ref Range Status  02/17/2019 04:25 AM 11.7 (H) 4.8 - 5.6 % Final    Comment:    (NOTE) Pre diabetes:          5.7%-6.4% Diabetes:              >6.4% Glycemic control for   <7.0% adults with diabetes   02/06/2019 06:54 AM 12.6 (H) 4.8 - 5.6 % Final    Comment:    (NOTE) Pre diabetes:          5.7%-6.4% Diabetes:              >6.4% Glycemic control for   <7.0% adults with diabetes     CBG: Recent Labs  Lab 02/19/19 1934 02/19/19 2313 02/20/19 0440 02/20/19 0747 02/20/19 1216  GLUCAP 313* 222* 132* 147* 204*      This patient is critically ill with multiple organ system failure which requires frequent high complexity decision making, assessment, support, evaluation, and titration of therapies. This was completed through the application of advanced monitoring technologies and extensive interpretation of multiple databases. During this encounter critical care time was devoted to patient care services described in this note for 70 minutes.   Julian Hy, DO 02/20/19 1:51 PM [facility]

## 2019-02-20 NOTE — Progress Notes (Signed)
Pharmacy Antibiotic Note  Andrew Bruce is a 54 y.o. male admitted on 02/06/2019 with pneumonia.  Pharmacy has been consulted for Zosyn and Vancomycin dosing.  Worsening leukocytosis. Febrile up to 101.7.  SCr 0.71 with CrCl >100 mL/min. Elevated PCT 2.11.   Plan: Zosyn 3.375g IV every 8 hours - 4 hr infusion. Vancomycin 1500 mg IV x1, then Vancomycin 1250mg  IV every 12 hours. Calculated AUC 454 using SCr 0.71 Monitor renal function, culture results, and clinical status  Height: 5\' 6"  (167.6 cm) Weight: 173 lb 4.5 oz (78.6 kg) IBW/kg (Calculated) : 63.8  Temp (24hrs), Avg:100.9 F (38.3 C), Min:100.2 F (37.9 C), Max:101.7 F (38.7 C)  Recent Labs  Lab 02/16/19 0421 02/17/19 0424 02/18/19 0250 02/19/19 0415 02/20/19 0550  WBC 14.4* 18.9* 17.1* 21.9* 20.5*  CREATININE 1.03 0.68 0.62 0.62 0.71    Estimated Creatinine Clearance: 106.5 mL/min (by C-G formula based on SCr of 0.71 mg/dL).    No Known Allergies  Antimicrobials this admission: Zosyn 2/6 >> Vancomycin 2/6 >>  Dose adjustments this admission:   Microbiology results: 2/5 BCx >> 2/5 RCx >> 1/23 BCx negative 1/23 MRSA pcr negative  Thank you for allowing pharmacy to be a part of this patient's care.  2/23, PharmD, BCPS, BCCCP Clinical Pharmacist Please refer to [facility] for [facility] numbers 02/20/2019 8:22 AM

## 2019-02-21 DIAGNOSIS — K5903 Drug induced constipation: Secondary | ICD-10-CM

## 2019-02-21 DIAGNOSIS — R739 Hyperglycemia, unspecified: Secondary | ICD-10-CM

## 2019-02-21 DIAGNOSIS — I2694 Multiple subsegmental pulmonary emboli without acute cor pulmonale: Secondary | ICD-10-CM

## 2019-02-21 DIAGNOSIS — R238 Other skin changes: Secondary | ICD-10-CM

## 2019-02-21 LAB — MAGNESIUM: Magnesium: 2.4 mg/dL (ref 1.7–2.4)

## 2019-02-21 LAB — BASIC METABOLIC PANEL
Anion gap: 11 (ref 5–15)
BUN: 28 mg/dL — ABNORMAL HIGH (ref 6–20)
CO2: 32 mmol/L (ref 22–32)
Calcium: 8 mg/dL — ABNORMAL LOW (ref 8.9–10.3)
Chloride: 95 mmol/L — ABNORMAL LOW (ref 98–111)
Creatinine, Ser: 0.58 mg/dL — ABNORMAL LOW (ref 0.61–1.24)
GFR calc Af Amer: >60 mL/min (ref >60)
GFR calc non Af Amer: >60 mL/min (ref >60)
Glucose, Bld: 257 mg/dL — ABNORMAL HIGH (ref 70–99)
Potassium: 3.4 mmol/L — ABNORMAL LOW (ref 3.5–5.1)
Sodium: 138 mmol/L (ref 135–145)

## 2019-02-21 LAB — GLUCOSE, CAPILLARY
Glucose-Capillary: 219 mg/dL — ABNORMAL HIGH (ref 70–99)
Glucose-Capillary: 279 mg/dL — ABNORMAL HIGH (ref 70–99)
Glucose-Capillary: 232 mg/dL — ABNORMAL HIGH (ref 70–99)
Glucose-Capillary: 190 mg/dL — ABNORMAL HIGH (ref 70–99)
Glucose-Capillary: 163 mg/dL — ABNORMAL HIGH (ref 70–99)
Glucose-Capillary: 107 mg/dL — ABNORMAL HIGH (ref 70–99)

## 2019-02-21 LAB — CBC
HCT: 30.2 % — ABNORMAL LOW (ref 39.0–52.0)
Hemoglobin: 9.4 g/dL — ABNORMAL LOW (ref 13.0–17.0)
MCH: 28.4 pg (ref 26.0–34.0)
MCHC: 31.1 g/dL (ref 30.0–36.0)
MCV: 91.2 fL (ref 80.0–100.0)
Platelets: 472 10*3/uL — ABNORMAL HIGH (ref 150–400)
RBC: 3.31 MIL/uL — ABNORMAL LOW (ref 4.22–5.81)
RDW: 14.2 % (ref 11.5–15.5)
WBC: 19.4 10*3/uL — ABNORMAL HIGH (ref 4.0–10.5)
nRBC: 0 % (ref 0.0–0.2)

## 2019-02-21 LAB — PHOSPHORUS: Phosphorus: 3.1 mg/dL (ref 2.5–4.6)

## 2019-02-21 MED ORDER — INSULIN DETEMIR 100 UNIT/ML ~~LOC~~ SOLN
40.0000 [IU] | Freq: Two times a day (BID) | SUBCUTANEOUS | Status: DC
  Administered 2019-02-21 – 2019-03-01 (×18): 40 [IU] via SUBCUTANEOUS
  Administered 2019-03-03: 20 [IU] via SUBCUTANEOUS
  Administered 2019-03-03 (×2): 40 [IU] via SUBCUTANEOUS
  Filled 2019-02-21 (×26): qty 0.4

## 2019-02-21 MED ORDER — POLYETHYLENE GLYCOL 3350 17 G PO PACK
17.0000 g | PACK | Freq: Every day | ORAL | Status: DC
  Administered 2019-02-21 – 2019-02-24 (×4): 17 g via ORAL
  Filled 2019-02-21 (×4): qty 1

## 2019-02-21 MED ORDER — SENNOSIDES-DOCUSATE SODIUM 8.6-50 MG PO TABS
2.0000 | ORAL_TABLET | Freq: Two times a day (BID) | ORAL | Status: DC
  Administered 2019-02-21 – 2019-02-24 (×6): 2
  Filled 2019-02-21 (×6): qty 2

## 2019-02-21 MED ORDER — POTASSIUM CHLORIDE 10 MEQ/50ML IV SOLN
10.0000 meq | INTRAVENOUS | Status: AC
  Administered 2019-02-21 (×4): 10 meq via INTRAVENOUS
  Filled 2019-02-21 (×4): qty 50

## 2019-02-21 NOTE — Progress Notes (Addendum)
STROKE TEAM PROGRESS NOTE   INTERVAL HISTORY Patient remains critically ill with refractory resp failure and sedated, intubated, and requiring pressor support. Family having difficulty in grasping his critical situation per CCM team but patient remains a full code with plans to reassess goals of care on Monday.. No neuro changes.  Vital signs stable.  Vitals:   02/21/19 0741 02/21/19 0745 02/21/19 0800 02/21/19 1116  BP: 127/73  (!) 146/76   Pulse: (!) 122  (!) 125   Resp: (!) 35  (!) 22   Temp:  (!) 102 F (38.9 C) (!) 102 F (38.9 C) 98.9 F (37.2 C)  TempSrc:  Axillary Axillary Oral  SpO2: 92%  92%   Weight:      Height:        CBC:  Recent Labs  Lab 02/18/19 0250 02/19/19 0415 02/20/19 0550 02/20/19 0550 02/20/19 0928 02/21/19 0458  WBC 17.1*   < > 20.5*  --   --  19.4*  NEUTROABS 15.2*  --   --   --   --   --   HGB 10.9*   < > 9.4*   < > 10.5* 9.4*  HCT 35.1*   < > 29.6*   < > 31.0* 30.2*  MCV 90.0   < > 89.2  --   --  91.2  PLT 387   < > 434*  --   --  472*   < > = values in this interval not displayed.    Basic Metabolic Panel:  Recent Labs  Lab 02/20/19 0550 02/20/19 0550 02/20/19 0928 02/21/19 0458  NA 139   < > 139 138  K 3.5   < > 3.4* 3.4*  CL 97*  --   --  95*  CO2 32  --   --  32  GLUCOSE 148*  --   --  257*  BUN 34*  --   --  28*  CREATININE 0.71  --   --  0.58*  CALCIUM 8.0*  --   --  8.0*  MG 2.5*  --   --  2.4  PHOS 3.4  --   --  3.1   < > = values in this interval not displayed.   Lipid Panel:     Component Value Date/Time   CHOL 134 02/17/2019 0424   TRIG 700 (H) 02/20/2019 0550   HDL 33 (L) 02/17/2019 0424   CHOLHDL 4.1 02/17/2019 0424   VLDL 26 02/17/2019 0424   LDLCALC 75 02/17/2019 0424   HgbA1c:  Lab Results  Component Value Date   HGBA1C 11.7 (H) 02/17/2019   IMAGING past 48 hours DG Chest Port 1 View  Result Date: 02/20/2019 CLINICAL DATA:  Respiratory distress. EXAM: PORTABLE CHEST 1 VIEW COMPARISON:  February 19, 2019. FINDINGS: The heart size and mediastinal contours are within normal limits. Endotracheal and nasogastric tubes are unchanged in position. Right-sided PICC line is unchanged. No pneumothorax or significant pleural effusion is noted. Bilateral chest tubes are noted. Stable bilateral lung opacities are noted most consistent with multifocal pneumonia. The visualized skeletal structures are unremarkable. IMPRESSION: Stable support apparatus. Stable bilateral lung opacities are noted most consistent with multifocal pneumonia. Stable bilateral chest tubes are noted without pneumothorax. Electronically Signed   By: Lupita Raider M.D.   On: 02/20/2019 10:38     PHYSICAL EXAM Frail malnourished looking middle-aged male who is sedated intubated and paralyzed. . Afebrile. Head is nontraumatic. Neck is supple without bruit.  Cardiac exam no murmur or gallop. Lungs are clear to auscultation. Distal pulses are well felt. Neurological Exam :  Patient is sedated intubated .  Eyes are closed.jas mild upgaze devaition.  Pupils 3 mm sluggishly reactive.  Doll's eye movement extremely sluggish.  Fundi not visualized.  Eyes are in primary position.  Face is symmetric.  Tongue midline.  Motor system exam no response to noxious stimuli including sternal rub and nailbed pressure.  Tone is diminished bilaterally.  Plantars are not elicitable bilaterally.  ASSESSMENT/PLAN Andrew Bruce is a 54 y.o. male with no significant past medical history admitted to the hospital 1/23 w/ SOB d/t COVID treated with remdesivir, dexamethasone and full dose anticoagulation. Intubated on the 25th. On the 27th, AC was decreased. Continued to need significant sedation for vent synchrony and proning. Once sedation lifted, noted to not be moving his R side. CT with L brain infarcts.   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 Possible R frontal convexity infarct.   CTA head & neck - not yet ordered  MRI - not yet ordered  Lower extremity Doppler - not yet ordered  Transcranial Doppler - not yet ordered  2D Echo - EF 55 - 60%. No cardiac source of emboli identified.   LDL NOT CALCULATED (Cho 134, HDL 33)  HgbA1c 11.7  Lovenox 40 mg sq q 12h for VTE prophylaxis  No antithrombotic prior to admission, now on aspirin 300 mg suppository daily.   Therapy recommendations:  pending   Disposition:  pending   Acuate hypoxemic respiratory failure d/t COVID-19 PNA, ARDS, bilateral pneumothorax s/p CT placement  Intubated  Ventilator synchrony issues, paralytics prn  Possible trach  D-dimer 1.2 (1/28)  Blood Pressure  Variable - 66/31 - 212/118 in past 24h . Permissive hypertension (OK if < 220/120) from stroke standpoint, gradually normalize in 5-7 days . Long-term BP goal normotensive  Hyperlipidemia  Home meds:  No statin  LDL NOT CALCULATED (Cho 134, HDL 33), goal < 70  Check direct LDL pending   Steroid Induced Hyperglycemia  HgbA1c 11.7, goal < 7.0  Dysphagia At Risk Malnutrition . Secondary to stroke . NPO . TF  Fever  Temp 102->98.9  Tachycardia  WBCs - 17.1->20.1->19.4  Likely due to Covid  CXR 02/20/19 c/w multifocal pneumonia   Other Stroke Risk Factors  ETOH use, advised to drink no more than 2 drink(s) a day  Overweight, Body mass index is 28.04 kg/m., recommend weight loss, diet and exercise as appropriate   Anemia  Hypokalemia  Code Status - Full code   Hospital day # 15  Patient likely has multiple embolic strokes related to Covid related hypercoagulability or vasculitis though cardiac sources are also possible.  Patient's medical condition has declined significantly with refractory respiratory failure hence I do not believe further ongoing stroke work-up is indicated till his condition  stabilizes.  His situation does not appear to be improving but family has decided to continue ongoing care for the moment and will reassess decision on goals of care on Monday.  Continue aspirin for now unless clear-cut evidence of DVT or pulmonary embolism is obtained.  Discussed with Dr. Boyd Kerbs critical care MD. This patient is critically ill and at significant risk of neurological worsening, death and care requires constant monitoring of vital signs, hemodynamics,respiratory and cardiac monitoring, extensive review of multiple  databases, frequent neurological assessment, discussion with family, other specialists and medical decision making of high complexity.I have made any additions or clarifications directly to the above note.This critical care time does not reflect procedure time, or teaching time or supervisory time of PA/NP/Med Resident etc but could involve care discussion time.  I spent 30 minutes of neurocritical care time  in the care of  this patient.  Antony Contras, MD    To contact Stroke Continuity provider, please refer to [email or web address] After hours, contact General Neurology

## 2019-02-21 NOTE — Progress Notes (Signed)
NAME:  Andrew Bruce, MRN:  [id number], DOB:  [birth year], LOS: 15 ADMISSION DATE:  02/06/2019, CONSULTATION DATE:  02/06/19 REFERRING MD:  ER, CHIEF COMPLAINT:  SOB   Brief History   [name] without a past medical history admitted 1/23 with 48 hours of worsening shortness of breath in the context of a 10-day Covid illness (dry cough, lost taste/smell).  Chest x-ray and vitals are consistent with a worsening COVID ARDS.  Admitted for observation.  Developed worsening shortness of breath and increasing O2 needs. Decompensated requiring intubation 1/24.  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   Past Medical History  None known prior to admit   Significant Hospital Events   1/23 Admit  1/24 Decompensated, tx to ICU, intubated 1/25 b/l chest tubes placed 1/25 CVC  Consults:  Neurology   Procedures:  ETT 1/24 >> Bilateral chest tube placement 1/25 >> R Femoral TLC 1/25 >>   Significant Diagnostic Tests:  CT Head 2/2 >> no acute intracranial hemorrhage, large area of infarct in the left occipital lobe, likely subacute CTA Chest 2/3 >> LLL segmental PE, no CT evidence of right heart strain, multifocal PNA, background emphysema and probable degree of interstitial fibrosis ECHO 2/3 >> LVEF 55-60%, no WMA, hypokinetic right ventricular free wall, McConnells sign present.  RV findings consistent with acute cor pulmonale, small pericardial effusion, mildly elevated pulmonary artery systolic pressure  Micro Data:  COVID 1/12 >> positive  PCT 1/23 >> Neg 2/5 blood cx >> 2/6 sputum cx>>  Antimicrobials:  Remdesivir 02/06/19>>1/28 Dexamethasone 02/06/19>>2/2   Interim history/subjective:  Paralytics stopped yesterday. Still heavily sedated and mostly synchronous with the vent.  Objective   Blood pressure (!) 146/76, pulse (!) 125, temperature (!) 102 F (38.9 C), temperature source Axillary, resp. rate (!) 22, height 5'  6" (1.676 m), weight 78.8 kg, SpO2 92 %.    Vent Mode: PRVC FiO2 (%):  [40 %-60 %] 50 % Set Rate:  [30 bmp] 30 bmp Vt Set:  [380 mL] 380 mL PEEP:  [12 cmH20] 12 cmH20 Plateau Pressure:  [19 cmH20-24 cmH20] 19 cmH20   Intake/Output Summary (Last 24 hours) at 02/21/2019 1106 Last data filed at 02/21/2019 0900 Gross per 24 hour  Intake 2359.6 ml  Output 2360 ml  Net -0.4 ml   Filed Weights   02/19/19 0500 02/20/19 0453 02/21/19 0404  Weight: 77.8 kg 78.6 kg 78.8 kg   Examination:  General: Critically ill middle-aged lying in bed no acute distress, intubated and heavily sedated HEENT: [place]/AT, eyes anicteric, oral mucosa moist. Neuro: RASS -5, breathing above the vent with mild synchrony.  Not withdrawing from pain in any extremity or trapezius squeeze.  +Tracheal cough, no pharyngeal gag. PERRL PULM: Minimal thick secretions from ET tube, rhonchi left base.  Chest tubes to suction GI: Soft, nontender, nondistended Extremities: No clubbing or edema.  Cyanosis of 4 toes on the left, 3 on the right, cool with evidence of embolic phenomenon Skin: No rashes    Resolved Hospital Problem list   Oliguria, hypernatremia  Assessment & Plan:   Acute hypoxemic respiratory failure due to COVID-19 pneumonia, ARDS, PE.  Acute left lower lobe pneumonia. -Continue low tidal volume ventilation, 4 to 8 cc/kg ideal body weight with goal driving pressure less than 15-20, plateau less than 30. -Continue heavy sedation for vent synchrony, but okay to hold Nimbex -Continue to follow respiratory cultures.  Continue vancomycin and Zosyn empirically -VAP Prevention protocol -Fentanyl and propofol  for sedation.  Low-dose propofol given previously elevated triglycerides. -If the family requires ongoing aggressive care, likely he will need tracheostomy soon.  I discussed yesterday at bedside with his wife and today over the phone with his daughter his prognosis and expected worse moving forward if they continue  to pursue aggressive care measures.  They will discuss this as a family. -Previously completed remdesivir and dexamethasone  Hypotension  Suspect element of sedation.  Afebrile. -May restart vasopressors as required to maintain MAP greater than 65 as needed.   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 Evidence of toe ischemia and microthrombi. Noted on CTA chest 2/3. High risk for embolizing but intervention is more risky at this time. -Continue full dose Lovenox  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     -Continue aspirin and full anticoagulation -We will decrease sedation and assess respiratory status allows, but family understands he is likely to have permanent deficits in a slow recovery due to the complicating features of his stroke with his other medical issues.  Bilateral pneumothoraces -Chest tubes bilaterally to suction.  These should remain until expiration. -Limiting airway pressure as much as possible  Pulmonary embolism- segmental on CTA chest 2/3 ordered as part of abnormal echo -Continue full dose Lovenox  Acute right heart failure related to ARDS and PE -Continue Lovenox and supportive care  Steroid-induced Hyperglycemia- uncontrolled -Increasing Levemir to 40 units BID -Sliding scale insulin as needed -Goal blood glucose 140-180 while admitted to the ICU  At Risk Malnutrition Continue tube feed  Constipation -Enema -Escalating bowel regimen  Hypertriglyceridemia due to propofol -Recheck triglycerides tomorrow -Max dose propofol 30  Prolonged Multiorgan dysfunction-guarded prognosis -Ongoing family discussions.  Updated his wife at bedside yesterday and his daughter over the phone today.  Best practice:  Diet: NPO, TF Pain/Anxiety/Delirium protocol (if indicated): in place VAP protocol (if indicated): Ordered DVT prophylaxis: lovenox GI prophylaxis: Pepcid Glucose control: SSI and Levemir Mobility: Bedrest Code  Status: Full Code  Family Communication: Updated daughter Disposition: ICU  Labs   CBC: Recent Labs  Lab 02/17/19 0424 02/17/19 0424 02/18/19 0250 02/19/19 0415 02/20/19 0550 02/20/19 0928 02/21/19 0458  WBC 18.9*  --  17.1* 21.9* 20.5*  --  19.4*  NEUTROABS  --   --  15.2*  --   --   --   --   HGB 11.7*   < > 10.9* 10.3* 9.4* 10.5* 9.4*  HCT 37.0*   < > 35.1* 33.4* 29.6* 31.0* 30.2*  MCV 87.9  --  90.0 92.0 89.2  --  91.2  PLT 420*  --  387 363 434*  --  472*   < > = values in this interval not displayed.    Basic Metabolic Panel: Recent Labs  Lab 02/15/19 0530 02/16/19 0421 02/17/19 0424 02/17/19 0424 02/18/19 0250 02/19/19 0415 02/20/19 0550 02/20/19 0928 02/21/19 0458  NA 138   < > 132*   < > 139 134* 139 139 138  K 3.9   < > 3.6   < > 3.4* 4.0 3.5 3.4* 3.4*  CL 90*   < > 93*  --  93* 96* 97*  --  95*  CO2 38*   < > 31  --  37* 28 32  --  32  GLUCOSE 108*   < > 235*  --  107* 294* 148*  --  257*  BUN 25*   < > 31*  --  36* 33*  34*  --  28*  CREATININE 0.66   < > 0.68  --  0.62 0.62 0.71  --  0.58*  CALCIUM 8.8*   < > 8.4*  --  8.8* 7.6* 8.0*  --  8.0*  MG 2.5*  --   --   --   --   --  2.5*  --  2.4  PHOS 5.1*  --   --   --   --   --  3.4  --  3.1   < > = values in this interval not displayed.   GFR: Estimated Creatinine Clearance: 106.6 mL/min (A) (by C-G formula based on SCr of 0.58 mg/dL (L)). Recent Labs  Lab 02/18/19 0250 02/19/19 0415 02/19/19 1001 02/20/19 0550 02/21/19 0458  PROCALCITON  --   --  2.11  --   --   WBC 17.1* 21.9*  --  20.5* 19.4*    Liver Function Tests: No results for input(s): AST, ALT, ALKPHOS, BILITOT, PROT, ALBUMIN in the last 168 hours. No results for input(s): LIPASE, AMYLASE in the last 168 hours. No results for input(s): AMMONIA in the last 168 hours.  ABG    Component Value Date/Time   PHART 7.382 02/20/2019 0928   PCO2ART 61.8 (H) 02/20/2019 0928   PO2ART 71.0 (L) 02/20/2019 0928   HCO3 36.9 (H) 02/20/2019  0928   TCO2 39 (H) 02/20/2019 0928   ACIDBASEDEF 2.0 02/08/2019 1444   O2SAT 93.0 02/20/2019 0928     Coagulation Profile: No results for input(s): INR, PROTIME in the last 168 hours.  Cardiac Enzymes: No results for input(s): CKTOTAL, CKMB, CKMBINDEX, TROPONINI in the last 168 hours.  HbA1C: Hgb A1c MFr Bld  Date/Time Value Ref Range Status  02/17/2019 04:25 AM 11.7 (H) 4.8 - 5.6 % Final    Comment:    (NOTE) Pre diabetes:          5.7%-6.4% Diabetes:              >6.4% Glycemic control for   <7.0% adults with diabetes   02/06/2019 06:54 AM 12.6 (H) 4.8 - 5.6 % Final    Comment:    (NOTE) Pre diabetes:          5.7%-6.4% Diabetes:              >6.4% Glycemic control for   <7.0% adults with diabetes     CBG: Recent Labs  Lab 02/20/19 1550 02/20/19 1949 02/20/19 2335 02/21/19 0325 02/21/19 0750  GLUCAP 201* 187* 260* 219* 279*      This patient is critically ill with multiple organ system failure which requires frequent high complexity decision making, assessment, support, evaluation, and titration of therapies. This was completed through the application of advanced monitoring technologies and extensive interpretation of multiple databases. During this encounter critical care time was devoted to patient care services described in this note for 55 minutes.   Julian Hy, DO 02/21/19 11:49 AM [facility]

## 2019-02-22 ENCOUNTER — Inpatient Hospital Stay (HOSPITAL_COMMUNITY): Payer: HRSA Program

## 2019-02-22 DIAGNOSIS — Z938 Other artificial opening status: Secondary | ICD-10-CM

## 2019-02-22 DIAGNOSIS — E1165 Type 2 diabetes mellitus with hyperglycemia: Secondary | ICD-10-CM

## 2019-02-22 DIAGNOSIS — Z95828 Presence of other vascular implants and grafts: Secondary | ICD-10-CM

## 2019-02-22 DIAGNOSIS — R509 Fever, unspecified: Secondary | ICD-10-CM

## 2019-02-22 DIAGNOSIS — I952 Hypotension due to drugs: Secondary | ICD-10-CM

## 2019-02-22 DIAGNOSIS — D72829 Elevated white blood cell count, unspecified: Secondary | ICD-10-CM

## 2019-02-22 LAB — CBC
HCT: 30.3 % — ABNORMAL LOW (ref 39.0–52.0)
Hemoglobin: 9 g/dL — ABNORMAL LOW (ref 13.0–17.0)
MCH: 27.4 pg (ref 26.0–34.0)
MCHC: 29.7 g/dL — ABNORMAL LOW (ref 30.0–36.0)
MCV: 92.1 fL (ref 80.0–100.0)
Platelets: 492 10*3/uL — ABNORMAL HIGH (ref 150–400)
RBC: 3.29 MIL/uL — ABNORMAL LOW (ref 4.22–5.81)
RDW: 14.2 % (ref 11.5–15.5)
WBC: 14.6 10*3/uL — ABNORMAL HIGH (ref 4.0–10.5)
nRBC: 0.2 % (ref 0.0–0.2)

## 2019-02-22 LAB — CULTURE, RESPIRATORY W GRAM STAIN

## 2019-02-22 LAB — GLUCOSE, CAPILLARY
Glucose-Capillary: 128 mg/dL — ABNORMAL HIGH (ref 70–99)
Glucose-Capillary: 134 mg/dL — ABNORMAL HIGH (ref 70–99)
Glucose-Capillary: 136 mg/dL — ABNORMAL HIGH (ref 70–99)
Glucose-Capillary: 154 mg/dL — ABNORMAL HIGH (ref 70–99)
Glucose-Capillary: 83 mg/dL (ref 70–99)
Glucose-Capillary: 93 mg/dL (ref 70–99)

## 2019-02-22 LAB — BASIC METABOLIC PANEL
Anion gap: 10 (ref 5–15)
BUN: 31 mg/dL — ABNORMAL HIGH (ref 6–20)
CO2: 34 mmol/L — ABNORMAL HIGH (ref 22–32)
Calcium: 8.4 mg/dL — ABNORMAL LOW (ref 8.9–10.3)
Chloride: 101 mmol/L (ref 98–111)
Creatinine, Ser: 0.47 mg/dL — ABNORMAL LOW (ref 0.61–1.24)
GFR calc Af Amer: >60 mL/min (ref >60)
GFR calc non Af Amer: >60 mL/min (ref >60)
Glucose, Bld: 103 mg/dL — ABNORMAL HIGH (ref 70–99)
Potassium: 3.5 mmol/L (ref 3.5–5.1)
Sodium: 145 mmol/L (ref 135–145)

## 2019-02-22 LAB — TRIGLYCERIDES: Triglycerides: 132 mg/dL (ref <150)

## 2019-02-22 LAB — MAGNESIUM: Magnesium: 2.2 mg/dL (ref 1.7–2.4)

## 2019-02-22 LAB — PHOSPHORUS: Phosphorus: 3 mg/dL (ref 2.5–4.6)

## 2019-02-22 MED ORDER — NON FORMULARY: 2.0000 mg | Freq: Three times a day (TID) | Status: DC

## 2019-02-22 MED ORDER — NALOXONE HCL 2 MG/2ML IJ SOSY
2.0000 mg | PREFILLED_SYRINGE | Freq: Three times a day (TID) | INTRAMUSCULAR | Status: DC
  Administered 2019-02-22 – 2019-02-24 (×5): 2 mg via INTRAVENOUS
  Filled 2019-02-22 (×7): qty 2

## 2019-02-22 MED ORDER — FUROSEMIDE 10 MG/ML IJ SOLN
40.0000 mg | Freq: Once | INTRAMUSCULAR | Status: AC
  Administered 2019-02-22: 08:00:00 40 mg via INTRAVENOUS
  Filled 2019-02-22: qty 4

## 2019-02-22 MED ORDER — ATORVASTATIN CALCIUM 40 MG PO TABS
20.0000 mg | ORAL_TABLET | Freq: Every day | ORAL | Status: DC
  Administered 2019-02-22 – 2019-02-27 (×6): 20 mg via ORAL
  Filled 2019-02-22: qty 2
  Filled 2019-02-22 (×2): qty 1
  Filled 2019-02-22: qty 2
  Filled 2019-02-22: qty 1
  Filled 2019-02-22: qty 2

## 2019-02-22 MED ORDER — NALOXONE HCL 2 MG/2ML IJ SOSY
2.0000 mg | PREFILLED_SYRINGE | Freq: Three times a day (TID) | INTRAMUSCULAR | Status: DC
  Administered 2019-02-22: 2 mg via INTRAVENOUS
  Filled 2019-02-22 (×3): qty 2

## 2019-02-22 MED ORDER — MIDAZOLAM HCL 2 MG/2ML IJ SOLN
2.0000 mg | INTRAMUSCULAR | Status: DC | PRN
  Administered 2019-02-25 (×2): 2 mg via INTRAVENOUS
  Filled 2019-02-22 (×2): qty 2

## 2019-02-22 NOTE — Progress Notes (Signed)
NAME:  Andrew Bruce, MRN:  [id number], DOB:  [birth year], LOS: 16 ADMISSION DATE:  02/06/2019, CONSULTATION DATE:  02/06/19 REFERRING MD:  ER, CHIEF COMPLAINT:  SOB   Brief History   [name] without a past medical history admitted 1/23 with 48 hours of worsening shortness of breath in the context of a 10-day Covid illness (dry cough, lost taste/smell).  Chest x-ray and vitals are consistent with a worsening COVID ARDS.  Admitted for observation.  Developed worsening shortness of breath and increasing O2 needs. Decompensated requiring intubation 1/24.  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   Past Medical History  None known prior to admit   Significant Hospital Events   1/23 Admit  1/24 Decompensated, tx to ICU, intubated 1/25 b/l chest tubes placed 1/25 CVC  Consults:  Neurology  Palliative care  Procedures:  ETT 1/24 >> Bilateral chest tube placement 1/25 >> R Femoral TLC 1/25 >>  RUE PICC  Significant Diagnostic Tests:  CT Head 2/2 >> no acute intracranial hemorrhage, large area of infarct in the left occipital lobe, likely subacute CTA Chest 2/3 >> LLL segmental PE, no CT evidence of right heart strain, multifocal PNA, background emphysema and probable degree of interstitial fibrosis ECHO 2/3 >> LVEF 55-60%, no WMA, hypokinetic right ventricular free wall, McConnells sign present.  RV findings consistent with acute cor pulmonale, small pericardial effusion, mildly elevated pulmonary artery systolic pressure  Micro Data:  COVID 1/12 >> positive  PCT 1/23 >> Neg 2/5 blood cx >> 2/6 sputum cx>>  Antimicrobials:  Remdesivir 02/06/19>>1/28 Dexamethasone 02/06/19>>2/2  vanc 2/5>> Zosyn 2/5>>  Interim history/subjective:  Remains off neuromuscular blockade.  Requiring increased sedation this morning for uncontrolled work of breathing.  Still no BM despite escalating bowel regimen.  Objective   Blood  pressure 119/75, pulse (!) 102, temperature 99.1 F (37.3 C), temperature source Oral, resp. rate 19, height 5\' 6"  (1.676 m), weight 78.8 kg, SpO2 96 %.    Vent Mode: PRVC FiO2 (%):  [50 %] 50 % Set Rate:  [30 bmp] 30 bmp Vt Set:  [380 mL] 380 mL PEEP:  [12 cmH20] 12 cmH20 Plateau Pressure:  [14 cmH20-26 cmH20] 23 cmH20   Intake/Output Summary (Last 24 hours) at 02/22/2019 1045 Last data filed at 02/22/2019 1010 Gross per 24 hour  Intake 2161.44 ml  Output 2830 ml  Net -668.56 ml   Filed Weights   02/19/19 0500 02/20/19 0453 02/21/19 0404  Weight: 77.8 kg 78.6 kg 78.8 kg   Examination:  General: Critically ill appearing man lying in bed intubated, sedated in no acute distress HEENT: [place]/AT, eyes anicteric oral mucosa moist.  Neuro: RASS -5, breathing above the vent, but synchronous.  Pupils equal and reactive.  Not withdrawing to pain in any extremities.  No cough or gag reflex. PULM: No significant secretions from ET tube.  Clear to auscultation bilaterally.  Chest tubes remain to suction.  GI: Soft, nontender Extremities: PICC in right upper extremity without erythema.  Bilateral upper extremity edema, no lower extremity edema.  Cyanosis of multiple digits-3 toes on the right, 4 on the left. Skin: Evidence of embolic phenomenon to toes, no rashes    Resolved Hospital Problem list   Oliguria, hypernatremia  Assessment & Plan:   Acute hypoxemic respiratory failure due to COVID-19 pneumonia, ARDS, PE.  Acute left lower lobe pneumonia. -Continue LTV V, 48 cc KG ideal body weight with goal plateau less than 30 and driving pressure less  than 15.  At goal. -Continue sedation required to maintain ventilator synchrony.  Stable off neuromuscular blockade. -Continue to follow respiratory cultures.  Continue empiric Vanco and Zosyn. -VAPprevention protocol  -Fentanyl and propofol for sedation.  Using oral agents to decrease IV requirements. -His family requires ongoing aggressive care, he  will need a tracheostomy soon.   -Previously completed remdesivir and dexamethasone.  Hypotension-no longer requiring vasopressors Suspect element of sedation.   -Continue to monitor; restart norepinephrine if unable to maintain MAP of 65   Aortic Clot - adherent to wall. Evidence of toe ischemia and microthrombi. Noted on CTA chest 2/3. High risk for embolizing but intervention is more risky at this time. Continue full dose anticoagulation  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     -Continue aspirin for anticoagulation.  Appreciate neurology input. -Unable to tolerate a significant decrease in sedation to facilitate evaluation of mental status unfortunately.  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 -Continue supportive care and full anticoagulation  Steroid-induced Hyperglycemia-now improved control -Continue Levemir 40 units twice daily; will require replacement dextrose if tube feeds are interrupted -Sliding scale insulin every 4 hours as needed -Goal BG 140-180 while mated to the ICU  At Risk Malnutrition -Continue enteral nutrition  Constipation -Adding oral naloxone given opioid-induced constipation -Continue current bowel regimen  Hypertriglyceridemia due to propofol-resolved on lower doses and better controlled blood glucose  -Periodically check triglyceride -Max dose propofol 30  Prolonged Multiorgan dysfunction-guarded prognosis -Ongoing family discussions. Will discuss with wife today.  Best practice:  Diet: NPO, TF Pain/Anxiety/Delirium protocol (if indicated): in place VAP protocol (if indicated): Ordered DVT prophylaxis: lovenox GI prophylaxis: Pepcid Glucose  control: SSI and Levemir Mobility: Bedrest Code Status: Full Code  Family Communication: will call wife Disposition: ICU  Labs   CBC: Recent Labs  Lab 02/18/19 0250 02/18/19 0250 02/19/19 0415 02/20/19 0550 02/20/19 0928 02/21/19 0458 02/22/19 0442  WBC 17.1*  --  21.9* 20.5*  --  19.4* 14.6*  NEUTROABS 15.2*  --   --   --   --   --   --   HGB 10.9*   < > 10.3* 9.4* 10.5* 9.4* 9.0*  HCT 35.1*   < > 33.4* 29.6* 31.0* 30.2* 30.3*  MCV 90.0  --  92.0 89.2  --  91.2 92.1  PLT 387  --  363 434*  --  472* 492*   < > = values in this interval not displayed.    Basic Metabolic Panel: Recent Labs  Lab 02/18/19 0250 02/18/19 0250 02/19/19 0415 02/20/19 0550 02/20/19 0928 02/21/19 0458 02/22/19 0442  NA 139   < > 134* 139 139 138 145  K 3.4*   < > 4.0 3.5 3.4* 3.4* 3.5  CL 93*  --  96* 97*  --  95* 101  CO2 37*  --  28 32  --  32 34*  GLUCOSE 107*  --  294* 148*  --  257* 103*  BUN 36*  --  33* 34*  --  28* 31*  CREATININE 0.62  --  0.62 0.71  --  0.58* 0.47*  CALCIUM 8.8*  --  7.6* 8.0*  --  8.0* 8.4*  MG  --   --   --  2.5*  --  2.4 2.2  PHOS  --   --   --  3.4  --  3.1 3.0   < > = values in this interval not displayed.   GFR: Estimated Creatinine Clearance: 106.6 mL/min (A) (by C-G formula based on SCr of 0.47 mg/dL (L)). Recent Labs  Lab 02/19/19 0415 02/19/19 1001 02/20/19 0550 02/21/19 0458 02/22/19 0442  PROCALCITON  --  2.11  --   --   --   WBC 21.9*  --  20.5* 19.4* 14.6*    Liver Function Tests: No results for input(s): AST, ALT, ALKPHOS, BILITOT, PROT, ALBUMIN in the last 168 hours. No results for input(s): LIPASE, AMYLASE in the last 168 hours. No results for input(s): AMMONIA in the last 168 hours.  ABG    Component Value Date/Time   PHART 7.382 02/20/2019 0928   PCO2ART 61.8 (H) 02/20/2019 0928   PO2ART 71.0 (L) 02/20/2019 0928   HCO3 36.9 (H) 02/20/2019 0928   TCO2 39 (H) 02/20/2019 0928   ACIDBASEDEF 2.0 02/08/2019 1444   O2SAT 93.0  02/20/2019 0928     Coagulation Profile: No results for input(s): INR, PROTIME in the last 168 hours.  Cardiac Enzymes: No results for input(s): CKTOTAL, CKMB, CKMBINDEX, TROPONINI in the last 168 hours.  HbA1C: Hgb A1c MFr Bld  Date/Time Value Ref Range Status  02/17/2019 04:25 AM 11.7 (H) 4.8 - 5.6 % Final    Comment:    (NOTE) Pre diabetes:          5.7%-6.4% Diabetes:              >6.4% Glycemic control for   <7.0% adults with diabetes   02/06/2019 06:54 AM 12.6 (H) 4.8 - 5.6 % Final    Comment:    (NOTE) Pre diabetes:          5.7%-6.4% Diabetes:              >6.4% Glycemic control for   <7.0% adults with diabetes     CBG: Recent Labs  Lab 02/21/19 1608 02/21/19 1928 02/21/19 2318 02/22/19 0308 02/22/19 0740  GLUCAP 190* 163* 107* 83 128*      This patient is critically ill with multiple organ system failure which requires frequent high complexity decision making, assessment, support, evaluation, and titration of therapies. This was completed through the application of advanced monitoring technologies and extensive interpretation of multiple databases. During this encounter critical care time was devoted to patient care services described in this note for 45 minutes.   Julian Hy, DO 02/22/19 10:58 AM [facility]

## 2019-02-22 NOTE — Progress Notes (Signed)
STROKE TEAM PROGRESS NOTE   INTERVAL HISTORY Pt RN at bedside. Pt still intubated, on sedation, not open eyes or following commands. Still has copious secretions. CCM has discussed with family, they need more time for further decision but currently aggressive care.   Vitals:   02/22/19 0732 02/22/19 0742 02/22/19 0800 02/22/19 0830  BP: 102/62  (!) 143/81 119/75  Pulse: 98  99 (!) 102  Resp: (!) 32  (!) 27 19  Temp:  99.1 F (37.3 C)    TempSrc:  Oral    SpO2: 96%  98% 96%  Weight:      Height:        CBC:  Recent Labs  Lab 02/18/19 0250 02/19/19 0415 02/21/19 0458 02/22/19 0442  WBC 17.1*   < > 19.4* 14.6*  NEUTROABS 15.2*  --   --   --   HGB 10.9*   < > 9.4* 9.0*  HCT 35.1*   < > 30.2* 30.3*  MCV 90.0   < > 91.2 92.1  PLT 387   < > 472* 492*   < > = values in this interval not displayed.    Basic Metabolic Panel:  Recent Labs  Lab 02/21/19 0458 02/22/19 0442  NA 138 145  K 3.4* 3.5  CL 95* 101  CO2 32 34*  GLUCOSE 257* 103*  BUN 28* 31*  CREATININE 0.58* 0.47*  CALCIUM 8.0* 8.4*  MG 2.4 2.2  PHOS 3.1 3.0   Lipid Panel:     Component Value Date/Time   CHOL 134 02/17/2019 0424   TRIG 132 02/22/2019 0442   HDL 33 (L) 02/17/2019 0424   CHOLHDL 4.1 02/17/2019 0424   VLDL 26 02/17/2019 0424   LDLCALC 75 02/17/2019 0424   HgbA1c:  Lab Results  Component Value Date   HGBA1C 11.7 (H) 02/17/2019   IMAGING past 48 hours DG Abd 1 View  Result Date: 02/22/2019 CLINICAL DATA:  54 year old male with constipation, ileus. EXAM: ABDOMEN - 1 VIEW COMPARISON:  Portable abdomen 02/07/2019. Portable chest 02/20/2019. FINDINGS: Enteric tube terminates in the stomach with side hole at the level of the gastric fundus. Non obstructed bowel gas pattern. There is a moderate volume of retained stool from the transverse colon distally. No acute osseous abnormality identified. Negative abdominal visceral contours. IMPRESSION: 1. Enteric tube side hole at the gastric fundus. 2.  Non obstructed bowel gas pattern with moderate volume of retained stool. Electronically Signed   By: Odessa Fleming M.D.   On: 02/22/2019 06:24    PHYSICAL EXAM  Temp:  [97.5 F (36.4 C)-99.4 F (37.4 C)] 97.5 F (36.4 C) (02/08 1535) Pulse Rate:  [91-120] 98 (02/08 1312) Resp:  [13-34] 31 (02/08 1312) BP: (82-143)/(56-82) 84/56 (02/08 1312) SpO2:  [91 %-98 %] 94 % (02/08 1312) FiO2 (%):  [50 %] 50 % (02/08 1312)  General - Well nourished, well developed, intubated on sedation with fentanyl and propofol.  Ophthalmologic - fundi not visualized due to noncooperation.  Cardiovascular - Regular rhythm but tachycardia.  Extremities - bilateral toes discoloration, b/l feet cold  Neuro - intubated on sedation, eyes closed, not following commands. With forced eye opening, eyes in mid position, not blinking to visual threat, doll's eyes absent, not tracking, PERRL. Corneal reflex left absent, right weakly positive, gag and cough absent. Breathing over the vent.  Facial symmetry not able to test due to ET tube.  Tongue protrusion not cooperative. On pain stimulation, no significant movement in all extremities. DTR diminished and  no babinski. Sensation, coordination and gait not tested.   ASSESSMENT/PLAN [name] is a 54 y.o. male with no significant past medical history admitted to the hospital 1/23 w/ SOB d/t COVID treated with remdesivir, dexamethasone and full dose anticoagulation. Intubated on the 25th. On the 27th, AC was decreased. Continued to need significant sedation for vent synchrony and proning. Once sedation lifted, noted to not be moving his R side. CT with L brain infarcts.   Stroke:   L inferior MCA, right frontal and bilateral cerebellar infarcts, embolic, source unclear, could be ? PFO in the setting of PE/DVT vs. COVID hypercoagulability/vasculitis  CT head large L occipital and R cerebellar infarct. Possible R frontal convexity infarct.   CTA head & neck - may consider  once stable  MRI - may consider once stable  LE venous Doppler - not change management, may consider once stable and improving   Transcranial Doppler - not change management, may consider once stable and improving  2D Echo - EF 55 - 60%. No cardiac source of emboli identified.   LDL 75  HgbA1c 11.7  Lovenox 75 mg sq q 12h for VTE prophylaxis  No antithrombotic prior to admission, now on full dose lovenox. Do not feel antiplatelet needed at this time, will d/c.   Therapy recommendations:  pending   Disposition:  pending   Family readdressing goals of care today - continue aggressive care  Acute hypoxemic respiratory failure d/t COVID-19 PNA, ARDS, bilateral pneumothorax s/p CT placement  Intubated  Ventilator synchrony issues, paralytics prn  B chest tubes  Possible trach soon  On vanco and zosyn   CCM on board  Aortic Clot PE Acute R heart Failure d/t ARDS and PE  Seen on CTA chest 2/3  Toe ischemia and microthrombi  High risk embolization, high risk intervention  D-dimer 1.2 (1/28) fibrinogen 706 (1/23)  On full dose lovenox for anticoagulation  Hypotension  Still on the low side most of time . BP goal normotensive . Off pressor . CCM on board  Hyperlipidemia  Home meds:  No statin  direct LDL 75, goal < 70  Put on lipitor 20  No high intensity statin given relative low LDL and high risk of medical deterioration  Diabetes, new diagnosis Steroid Induced Hyperglycemia  HgbA1c 11.7, goal < 7.0  On Levemir   Hyperglycemia much improved  SSI  CBG monitoring  Dysphagia At Risk Malnutrition . Secondary to stroke . NPO . TF @ 50  Fever with leukocytosis  TMax 102->97.5  Tachycardia  WBCs - 17.1->20.1->19.4->14.6  CXR 02/20/19 c/w multifocal pneumonia  On vanco and zosyn  Other Stroke Risk Factors  ETOH use   Other Problems  Urinary retention, foley placed  Constipation, bowel regimen, KUB  hypertriglyceridemia d/t  propofol [id number], resolved   Hypokalemia - supplement   Hospital day # 16   No new recommendation from neurology at this time. Neurology will follow peripherally. Please feel free to call with questions.   Rosalin Hawking, MD PhD Stroke Neurology 02/22/2019 5:42 PM  This patient is critically ill and at significant risk of neurological worsening, death and care requires constant monitoring of vital signs, hemodynamics,respiratory and cardiac monitoring, extensive review of multiple databases, frequent neurological assessment, discussion with family, other specialists and medical decision making of high complexity. I spent 30 minutes of neurocritical care time  in the care of  this patient.  To contact Stroke Continuity provider, please refer to [email or web address] After hours, contact General Neurology

## 2019-02-22 NOTE — Progress Notes (Signed)
eLink Physician-Brief Progress Note Patient Name: Andrew Bruce DOB: [birth year] MRN: [id number]   Date of Service  02/22/2019  HPI/Events of Note  RN requests KUB due to no BM despite bowel regimen from above and below.  Also requests foley given 2x straight cath previously and PVR is >400cc.   eICU Interventions  Placed orders for foley and KUB.     Intervention Category Minor Interventions: Other:  Janae Bridgeman 02/22/2019, 3:41 AM

## 2019-02-22 NOTE — Plan of Care (Signed)
I updated Mr. Devera daughter by phone.  All questions were answered.  She has been discussed with her family's care and need to discuss their desires for his care moving forward.  Steffanie Dunn, DO 02/22/19 11:55 AM [facility]

## 2019-02-22 NOTE — Progress Notes (Signed)
Pt has no BM documented since 1/24 and CBGs have slowly been dropping over this shift. RN notified [facility] for possible KUB. Will continue to monitor closely.  Caswell Corwin, RN 02/22/19

## 2019-02-22 NOTE — Progress Notes (Signed)
eLink Physician-Brief Progress Note Patient Name: Savior Himebaugh DOB: Jan 11, 1966 MRN: [id number]   Date of Service  02/22/2019  HPI/Events of Note  Pt needs a chest X-ray to evaluate chest tubes which have been in for a while.  eICU Interventions  CXR ordered for the a.m. to evaluate chest tubes.        Thomasene Lot Deanglo Hissong 02/22/2019, 11:32 PM

## 2019-02-23 ENCOUNTER — Inpatient Hospital Stay (HOSPITAL_COMMUNITY): Payer: HRSA Program

## 2019-02-23 DIAGNOSIS — I6340 Cerebral infarction due to embolism of unspecified cerebral artery: Secondary | ICD-10-CM

## 2019-02-23 DIAGNOSIS — Z9911 Dependence on respirator [ventilator] status: Secondary | ICD-10-CM

## 2019-02-23 DIAGNOSIS — Z515 Encounter for palliative care: Secondary | ICD-10-CM

## 2019-02-23 DIAGNOSIS — J189 Pneumonia, unspecified organism: Secondary | ICD-10-CM

## 2019-02-23 DIAGNOSIS — Z7189 Other specified counseling: Secondary | ICD-10-CM

## 2019-02-23 LAB — CBC
HCT: 29.6 % — ABNORMAL LOW (ref 39.0–52.0)
Hemoglobin: 8.6 g/dL — ABNORMAL LOW (ref 13.0–17.0)
MCH: 27 pg (ref 26.0–34.0)
MCHC: 29.1 g/dL — ABNORMAL LOW (ref 30.0–36.0)
MCV: 92.8 fL (ref 80.0–100.0)
Platelets: 547 10*3/uL — ABNORMAL HIGH (ref 150–400)
RBC: 3.19 MIL/uL — ABNORMAL LOW (ref 4.22–5.81)
RDW: 14.3 % (ref 11.5–15.5)
WBC: 13.9 10*3/uL — ABNORMAL HIGH (ref 4.0–10.5)
nRBC: 0.3 % — ABNORMAL HIGH (ref 0.0–0.2)

## 2019-02-23 LAB — BASIC METABOLIC PANEL
Anion gap: 9 (ref 5–15)
BUN: 30 mg/dL — ABNORMAL HIGH (ref 6–20)
CO2: 38 mmol/L — ABNORMAL HIGH (ref 22–32)
Calcium: 8.1 mg/dL — ABNORMAL LOW (ref 8.9–10.3)
Chloride: 98 mmol/L (ref 98–111)
Creatinine, Ser: 0.49 mg/dL — ABNORMAL LOW (ref 0.61–1.24)
GFR calc Af Amer: >60 mL/min (ref >60)
GFR calc non Af Amer: >60 mL/min (ref >60)
Glucose, Bld: 143 mg/dL — ABNORMAL HIGH (ref 70–99)
Potassium: 3 mmol/L — ABNORMAL LOW (ref 3.5–5.1)
Sodium: 145 mmol/L (ref 135–145)

## 2019-02-23 LAB — GLUCOSE, CAPILLARY
Glucose-Capillary: 117 mg/dL — ABNORMAL HIGH (ref 70–99)
Glucose-Capillary: 144 mg/dL — ABNORMAL HIGH (ref 70–99)
Glucose-Capillary: 166 mg/dL — ABNORMAL HIGH (ref 70–99)
Glucose-Capillary: 186 mg/dL — ABNORMAL HIGH (ref 70–99)
Glucose-Capillary: 96 mg/dL (ref 70–99)
Glucose-Capillary: 96 mg/dL (ref 70–99)

## 2019-02-23 LAB — PHOSPHORUS: Phosphorus: 3.8 mg/dL (ref 2.5–4.6)

## 2019-02-23 LAB — MAGNESIUM: Magnesium: 2.3 mg/dL (ref 1.7–2.4)

## 2019-02-23 LAB — TRIGLYCERIDES: Triglycerides: 161 mg/dL — ABNORMAL HIGH (ref <150)

## 2019-02-23 MED ORDER — JUVEN PO PACK
1.0000 | PACK | Freq: Two times a day (BID) | ORAL | Status: DC
  Administered 2019-02-23 – 2019-03-23 (×49): 1
  Filled 2019-02-23 (×49): qty 1

## 2019-02-23 MED ORDER — POTASSIUM CHLORIDE 20 MEQ/15ML (10%) PO SOLN
40.0000 meq | Freq: Once | ORAL | Status: AC
  Administered 2019-02-23: 40 meq
  Filled 2019-02-23: qty 30

## 2019-02-23 MED ORDER — SODIUM CHLORIDE 0.9 % IV SOLN
2.0000 g | Freq: Three times a day (TID) | INTRAVENOUS | Status: DC
  Administered 2019-02-23 – 2019-02-25 (×6): 2 g via INTRAVENOUS
  Filled 2019-02-23 (×6): qty 2

## 2019-02-23 MED ORDER — POTASSIUM CHLORIDE 10 MEQ/50ML IV SOLN
10.0000 meq | INTRAVENOUS | Status: AC
  Administered 2019-02-23 (×4): 10 meq via INTRAVENOUS
  Filled 2019-02-23 (×4): qty 50

## 2019-02-23 NOTE — Progress Notes (Signed)
RT NOTES: Advanced ETT to 28 cm at the lip per MD order.

## 2019-02-23 NOTE — Progress Notes (Signed)
Pharmacy Antibiotic Note  Andrew Bruce is a 54 y.o. male admitted on 02/06/2019 with Covid-19 pneumonia. Chest xray shows bilateral opacities, concern for bialteral pneumonia. Trach aspirate shows corynebacterium, though not usually pathogenic, will plan to treat given severity of acute illness. SCr 0.49.    Plan: Cefepime 2 g IV q8h Vancomycin 1250mg  IV every 12 hours. Monitor renal function, culture results, and clinical status   Height: 5\' 6"  (167.6 cm) Weight: 173 lb 11.6 oz (78.8 kg) IBW/kg (Calculated) : 63.8  Temp (24hrs), Avg:98.4 F (36.9 C), Min:97.5 F (36.4 C), Max:99.1 F (37.3 C)  Recent Labs  Lab 02/19/19 0415 02/20/19 0550 02/21/19 0458 02/22/19 0442 02/23/19 0328  WBC 21.9* 20.5* 19.4* 14.6* 13.9*  CREATININE 0.62 0.71 0.58* 0.47* 0.49*    Estimated Creatinine Clearance: 106.6 mL/min (A) (by C-G formula based on SCr of 0.49 mg/dL (L)).    No Known Allergies  Antimicrobials this admission: Zosyn 2/6 >> 2/9 Vancomycin 2/6 >> Cefepime 2/9 >>   Microbiology results: 2/5 BCx >> ngtd 2/5 RCx >> diptheroids 1/23 BCx negative 1/23 MRSA pcr negative   2/23 02/23/2019 9:45 AM]

## 2019-02-23 NOTE — Plan of Care (Signed)
  Problem: Clinical Measurements: Goal: Respiratory complications will improve Outcome: Progressing Goal: Cardiovascular complication will be avoided Outcome: Progressing   Problem: Nutrition: Goal: Adequate nutrition will be maintained Outcome: Progressing   Problem: Pain Managment: Goal: General experience of comfort will improve Outcome: Progressing   Problem: Safety: Goal: Ability to remain free from injury will improve Outcome: Progressing   

## 2019-02-23 NOTE — Progress Notes (Signed)
eLink Physician-Brief Progress Note Patient Name: Andrew Bruce DOB: September 05, 1965 MRN: [id number]   Date of Service  02/23/2019  HPI/Events of Note  K+ 3.0  eICU Interventions  KCL 10 meq Q 1 hours x 4 doses.        [name] 02/23/2019, 5:09 AM

## 2019-02-23 NOTE — Consult Note (Signed)
Consultation Note Date: 02/23/2019   Patient Name: Andrew Bruce  DOB: 1965-01-28  MRN: [id number]  Age / Sex: 54 y.o., male  PCP: Patient, No Pcp Per Referring Physician: Julian Hy, DO  Reason for Consultation: Establishing goals of care  HPI/Patient Profile: 54 y.o. male  with past medical history of none known admitted on 02/06/2019 with shortness of breath with COVID ARDS, acute kidney injury, mild transaminitis. Required intubation 1/24 and hospitalization complicated by multiple strokes, bilateral pneumothorax requiring chest tube placement, and aortic clot. He has been unable to wean to extubate with copious secretions and poor neurological status.   Clinical Assessment and Goals of Care: I met today at Andrew Bruce bedside after reviewing records. He has had an unfortunate course that has resulted in poor status and ventilator dependence. He is requiring sedation and otherwise dyssynchronous with vent so unable to have a good neurological exam. Discussed with Dr. Carlis Abbott and bedside RN.  I called and spoke with daughter, Colletta [place], and [name]. We agreed to meet in person with Spanish translator tomorrow at 4 pm. This meeting will include myself, his wife Juliene Pina, daughter [name], siblings Watt Climes and Christy Sartorius. Andrew Bruce seemed to understand that there is a decision to be made regarding tracheostomy. She asks questions about his illness being related to COVID vs stroke. She expresses that she knows our meeting will be emotional for her so I feel that she understands the gravity of the situation and decisions at hand.  They also allude to the "emotional roller coaster" of conversations over her father's care.   All questions/concerns addressed at this time - we will discuss more in detail tomorrow in person. Emotional support provided.   Primary Decision Maker NEXT OF KIN wife Andrew Bruce with family tomorrow 02/24/19 1600 to discuss how to move forward  Code Status/Advance Care Planning:  Full code   Symptom Management:   Per PCCM  Palliative Prophylaxis:   Aspiration, Bowel Regimen, VAP Protocol  Psycho-social/Spiritual:   Desire for further Chaplaincy support:no  Additional Recommendations: Compassionate Wean Education  Prognosis:   Unable to determine  Discharge Planning: To Be Determined      Primary Diagnoses: Present on Admission: . COVID-19   I have reviewed the medical record, interviewed the patient and family, and examined the patient. The following aspects are pertinent.  History reviewed. No pertinent past medical history. Social History   Socioeconomic History  . Marital status: Married    Spouse name: Not on file  . Number of children: Not on file  . Years of education: Not on file  . Highest education level: Not on file  Occupational History  . Not on file  Tobacco Use  . Smoking status: Never Smoker  . Smokeless tobacco: Never Used  Substance and Sexual Activity  . Alcohol use: Yes  . Drug use: Never  . Sexual activity: Not on file  Other Topics Concern  . Not on file  Social History Narrative  .  Not on file   Social Determinants of Health   Financial Resource Strain:   . Difficulty of Paying Living Expenses: Not on file  Food Insecurity:   . Worried About Charity fundraiser in the Last Year: Not on file  . Ran Out of Food in the Last Year: Not on file  Transportation Needs:   . Lack of Transportation (Medical): Not on file  . Lack of Transportation (Non-Medical): Not on file  Physical Activity:   . Days of Exercise per Week: Not on file  . Minutes of Exercise per Session: Not on file  Stress:   . Feeling of Stress : Not on file  Social Connections:   . Frequency of Communication with Friends and Family: Not on file  . Frequency of Social Gatherings with Friends and Family: Not on  file  . Attends Religious Services: Not on file  . Active Member of Clubs or Organizations: Not on file  . Attends Archivist Meetings: Not on file  . Marital Status: Not on file   No family history on file. Scheduled Meds: . artificial tears  1 application Both Eyes Z6X  . vitamin C  500 mg Per Tube Daily  . atorvastatin  20 mg Oral q1800  . chlorhexidine gluconate (MEDLINE KIT)  15 mL Mouth Rinse BID  . Chlorhexidine Gluconate Cloth  6 each Topical Daily  . clonazePAM  2 mg Per Tube BID  . enoxaparin (LOVENOX) injection  1 mg/kg Subcutaneous Q12H  . famotidine  20 mg Per Tube BID  . feeding supplement (PRO-STAT SUGAR FREE 64)  30 mL Per Tube TID  . folic acid  1 mg Per Tube Daily  . gabapentin  125 mg Per Tube Q8H  . insulin aspart  0-20 Units Subcutaneous Q4H  . insulin aspart  2 Units Subcutaneous Q4H  . insulin detemir  40 Units Subcutaneous BID  . mouth rinse  15 mL Mouth Rinse 10 times per day  . multivitamin with minerals  1 tablet Per Tube Daily  . naLOXone (NARCAN)  injection  2 mg Intravenous TID  . oxyCODONE  5 mg Per Tube Q4H  . polyethylene glycol  17 g Oral Daily  . QUEtiapine  50 mg Per Tube BID  . senna-docusate  2 tablet Per Tube BID  . sodium chloride flush  10-40 mL Intracatheter Q12H  . tamsulosin  0.4 mg Oral Daily  . thiamine  100 mg Per Tube Daily  . zinc sulfate  220 mg Per Tube Daily   Continuous Infusions: . sodium chloride Stopped (02/20/19 1150)  . feeding supplement (VITAL 1.5 CAL) 1,000 mL (02/22/19 2240)  . fentaNYL infusion INTRAVENOUS 200 mcg/hr (02/23/19 0600)  . norepinephrine (LEVOPHED) Adult infusion Stopped (02/20/19 0848)  . piperacillin-tazobactam (ZOSYN)  IV Stopped (02/23/19 0401)  . potassium chloride 10 mEq (02/23/19 0826)  . propofol (DIPRIVAN) infusion 30 mcg/kg/min (02/23/19 0600)  . vancomycin Stopped (02/22/19 2358)   PRN Meds:.acetaminophen (TYLENOL) oral liquid 160 mg/5 mL, acetaminophen, fentaNYL (SUBLIMAZE)  injection, hydrALAZINE, midazolam, sodium chloride flush No Known Allergies Review of Systems  Unable to perform ROS: Intubated    Physical Exam Vitals and nursing note reviewed.  Constitutional:      Appearance: He is ill-appearing.     Interventions: He is intubated.  Cardiovascular:     Rate and Rhythm: Normal rate.     Comments: Bilat toes cold and cyanotic Pulmonary:     Effort: Tachypnea and accessory muscle usage present.  He is intubated.     Comments: Appears agonal on vent Abdominal:     General: Abdomen is flat.     Palpations: Abdomen is soft.  Neurological:     Mental Status: He is unresponsive.     Vital Signs: BP 115/72   Pulse 94   Temp 98.5 F (36.9 C) (Oral)   Resp (!) 32   Ht _0  (1.676 m)   Wt 78.8 kg   SpO2 96%   BMI 28.04 kg/m  Pain Scale: CPOT   Pain Score: 0-No pain   SpO2: SpO2: 96 % O2 Device:SpO2: 96 % O2 Flow Rate: .O2 Flow Rate (L/min): 15 L/min  IO: Intake/output summary:   Intake/Output Summary (Last 24 hours) at 02/23/2019 9914 Last data filed at 02/23/2019 0730 Gross per 24 hour  Intake 2641.94 ml  Output 2935 ml  Net -293.06 ml    LBM: Last BM Date: 02/07/19 Baseline Weight: Weight: 80.3 kg Most recent weight: Weight: 78.8 kg     Palliative Assessment/Data:     Time In: 1400 Time Out: 1450 Time Total: 50 min Greater than 50%  of this time was spent counseling and coordinating care related to the above assessment and plan.  Signed by: Vinie Sill, NP Palliative Medicine Team Pager # [phone] (M-F 8a-5p) Team Phone # [phone] (Nights/Weekends)

## 2019-02-23 NOTE — Progress Notes (Signed)
Nutrition Follow-up  DOCUMENTATION CODES:   Not applicable  INTERVENTION:   Tube Feeding:  Continue Vital 1.5 at 50 ml/hr Continue Pro-Stat 30 mL TID Provides 2100 kcals, 126 g of protein and 912 mL of free water  TF regimen and propofol at current rate providing 2346 total kcal/day   Add Juven BID, each packet provides 80 calories, 8 grams of carbohydrate, 2.5  grams of protein (collagen), 7 grams of L-arginine and 7 grams of L-glutamine; supplement contains CaHMB, Vitamins C, E, B12 and Zinc to promote wound healing  NUTRITION DIAGNOSIS:   Inadequate oral intake related to acute illness as evidenced by NPO status.  Being addressed via TF   GOAL:   Patient will meet greater than or equal to 90% of their needs  Progressing  MONITOR:   TF tolerance, Vent status, Labs, Weight trends  REASON FOR ASSESSMENT:   Consult, Ventilator Enteral/tube feeding initiation and management  ASSESSMENT:   54 yo male admitted with acute respiratory failure due to COVID-19 pneumonia, ARDS requiring intubation, shock. No PMH   RD working remotely.  1/23 Admit 1/24 Intubated 1/25 B/L chest tubes placed 2/02 CT head: multiple ischemic infarcts   Patient is currently intubated on ventilator support MV: 12.6 L/min Temp (24hrs), Avg:98.4 F (36.9 C), Min:97.5 F (36.4 C), Max:99.1 F (37.3 C)  Propofol: 9.34 ml/hr  Vital 1.5 at 50 ml/hr, Pro-Stat 30 mL TID  Noted b/l toe discoloration, b/l feet cold per MD notes  Constipated; noted MD adjusting bowel regimen  Noted newly documented DTI to coccyx on 2/8  Utilizing 76 kg as EDW  Labs: potssium 3.0 (L), CBGs 96-154 Meds: MVI, Vit C, zinc sulfate, ss novolog, novolog q 4 hours, levemir   Diet Order:   Diet Order            Diet NPO time specified  Diet effective now              EDUCATION NEEDS:   Not appropriate for education at this time  Skin:  Skin Assessment: Skin Integrity Issues: Skin Integrity Issues::  DTI DTI: coccyx  Last BM:  1/29  Height:   Ht Readings from Last 1 Encounters:  02/06/19 5\' 6"  (1.676 m)    Weight:   Wt Readings from Last 1 Encounters:  02/21/19 78.8 kg    BMI:  Body mass index is 28.04 kg/m.  Estimated Nutritional Needs:   Kcal:  04/21/19 kcals  Protein:  120-145 g  Fluid:  >/= 2 L   [phone] MS, RDN, LDN, CNSC RD Pager Number and Weekend/On-Call After Hours Pager Located in [place]

## 2019-02-23 NOTE — Plan of Care (Signed)
I had a discussion with Andrew Bruce his wife via Spanish interpreter this afternoon.  She would like to continue aggressive care, but is not ready to consent for trach.  She understands that he has been intubated for over 2 weeks and this decision needs to be made soon.  She would like to wait until next week to make this decision.  She was updated on his ongoing care.  Steffanie Dunn, DO 02/23/19 5:04 PM [facility]

## 2019-02-23 NOTE — Progress Notes (Signed)
NAME:  Andrew Bruce, MRN:  [id number], DOB:  May 13, 1965, LOS: 17 ADMISSION DATE:  02/06/2019, CONSULTATION DATE:  02/06/19 REFERRING MD:  ER, CHIEF COMPLAINT:  SOB   Brief History   [name] without a past medical history admitted 1/23 with 48 hours of worsening shortness of breath in the context of a 10-day Covid illness (dry cough, lost taste/smell).  Chest x-ray and vitals are consistent with a worsening COVID ARDS.  Admitted for observation.  Developed worsening shortness of breath and increasing O2 needs. Decompensated requiring intubation 1/24.  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   Past Medical History  None known prior to admit   Significant Hospital Events   1/23 Admit  1/24 Decompensated, tx to ICU, intubated 1/25 b/l chest tubes placed 1/25 CVC  Consults:  Neurology  Palliative care  Procedures:  ETT 1/24 >> Bilateral chest tube placement 1/25 >> R Femoral TLC 1/25 >>  RUE PICC  Significant Diagnostic Tests:  CT Head 2/2 >> no acute intracranial hemorrhage, large area of infarct in the left occipital lobe, likely subacute CTA Chest 2/3 >> LLL segmental PE, no CT evidence of right heart strain, multifocal PNA, background emphysema and probable degree of interstitial fibrosis ECHO 2/3 >> LVEF 55-60%, no WMA, hypokinetic right ventricular free wall, McConnells sign present.  RV findings consistent with acute cor pulmonale, small pericardial effusion, mildly elevated pulmonary artery systolic pressure  Micro Data:  COVID 1/12 >> positive  PCT 1/23 >> Neg 2/5 blood cx >> 2/6 sputum cx>> diphtheroids  Antimicrobials:  Remdesivir 02/06/19>>1/28 Dexamethasone 02/06/19>>2/2  vanc 2/5>>2/9 Zosyn 2/5>>  Interim history/subjective:  Still no BM. Afebrile.   Objective   Blood pressure 115/72, pulse 94, temperature 98.5 F (36.9 C), temperature source Oral, resp. rate (!) 32, height 5\' 6"  (1.676 m),  weight 78.8 kg, SpO2 96 %.    Vent Mode: PRVC FiO2 (%):  [40 %-50 %] 40 % Set Rate:  [30 bmp] 30 bmp Vt Set:  [380 mL] 380 mL PEEP:  [12 cmH20] 12 cmH20 Plateau Pressure:  [18 cmH20-30 cmH20] 18 cmH20   Intake/Output Summary (Last 24 hours) at 02/23/2019 0900 Last data filed at 02/23/2019 0730 Gross per 24 hour  Intake 2641.94 ml  Output 2935 ml  Net -293.06 ml   Filed Weights   02/19/19 0500 02/20/19 0453 02/21/19 0404  Weight: 77.8 kg 78.6 kg 78.8 kg   Examination:  General: Critically ill-appearing man lying in bed intubated, sedated. HEENT: [place]/AT, temporal wasting, eyes anicteric Neuro: RASS -5, pain-free.  Above the vent with good synchrony.  Not withdrawing from pain. PULM: Thick blood-tinged secretions from ET tube, rhonchi.  Suctioning.  Tachypneic, decreased basilar breath sounds.  Left chest tube with sluggish drainage GI: Soft, nontender, nondistended.  Active bowel sounds throughout. Extremities: PICC RUE, no lower extremity edema, mild upper extremity edema.  Cyanosis of multiple toes.  Skin: No rashes or wounds.  Embolic changes to toes.    Resolved Hospital Problem list   Oliguria, hypernatremia  Assessment & Plan:   Acute hypoxemic respiratory failure due to COVID-19 pneumonia, ARDS, PE.  Acute left lower lobe pneumonia. -Continue low tidal volume ventilation, 4 to 80 cc/kg ideal body weight with goal plateau less than 30 and driving pressure 04/21/19 is currently at goal -Continue titrating PEEP and FiO2 per ARDS ladder.  Doing well enough to start reducing sedation. -Sedation as required to maintain ventilator stability, okay to start decreasing. -Continue empiric Vanco  and Zosyn to complete a 7-day course -Prevention protocol   -Previously completed remdesivir and dexamethasone. -Will require tracheostomy soon family continues to wish for ongoing aggressive care.  Hypotension-no longer requiring vasopressors Suspect element of sedation.   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 Evidence of toe ischemia and microthrombi. Noted on CTA chest 2/3. High risk for embolizing but intervention is more risky at this time. -Continue full dose anticoagulation  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     -Con't aspirin & AC -working on decreasing sedation as vent weaning allows  Bilateral pneumothoraces -Con't chest tube to suction -Changed connection of left side chest tube due to clogging today.  Right side changed 2/6.  Pulmonary embolism- segmental on CTA chest 2/3 ordered as part of abnormal echo. Provoked by covid.  -con't full dose AC minimum 3 months, potentially longer if he remains immobilized  Acute right heart failure related to ARDS and PE -con't supportive care  Steroid-induced Hyperglycemia-now improved control -con't levemir  -Sliding scale insulin every 4 hours as needed -Goal BG 140-180 while admitted to ICU  At Risk Malnutrition -Continue enteral nutrition  Constipation -Continue oral naloxone for opioid-induced constipation -Continue current bowel regimen  Hypertriglyceridemia due to propofol-resolved on lower doses and better controlled blood glucose  -Try to limit propofol to max dose 30  Prolonged Multiorgan dysfunction-guarded prognosis -Ongoing family discussions. Will discuss with wife today-- attempted to call her with a Translator but she was unavailable. Discussed his care at bedside with Palliative Care.  Best practice:  Diet: NPO, TF Pain/Anxiety/Delirium protocol (if indicated): in place VAP protocol (if indicated): Ordered DVT prophylaxis: lovenox GI prophylaxis: Pepcid Glucose control: SSI and Levemir Mobility: Bedrest Code Status: Full Code  Family Communication: will call wife Disposition: ICU  Labs   CBC: Recent Labs  Lab 02/18/19 0250 02/18/19 0250 02/19/19 0415  02/19/19 0415 02/20/19 0550 02/20/19 0928 02/21/19 0458 02/22/19 0442 02/23/19 0328  WBC 17.1*   < > 21.9*  --  20.5*  --  19.4* 14.6* 13.9*  NEUTROABS 15.2*  --   --   --   --   --   --   --   --   HGB 10.9*   < > 10.3*   < > 9.4* 10.5* 9.4* 9.0* 8.6*  HCT 35.1*   < > 33.4*   < > 29.6* 31.0* 30.2* 30.3* 29.6*  MCV 90.0   < > 92.0  --  89.2  --  91.2 92.1 92.8  PLT 387   < > 363  --  434*  --  472* 492* 547*   < > = values in this interval not displayed.    Basic Metabolic Panel: Recent Labs  Lab 02/19/19 0415 02/19/19 0415 02/20/19 0550 02/20/19 0928 02/21/19 0458 02/22/19 0442 02/23/19 0328  NA 134*   < > 139 139 138 145 145  K 4.0   < > 3.5 3.4* 3.4* 3.5 3.0*  CL 96*  --  97*  --  95* 101 98  CO2 28  --  32  --  32 34* 38*  GLUCOSE 294*  --  148*  --  257* 103* 143*  BUN 33*  --  34*  --  28* 31* 30*  CREATININE 0.62  --  0.71  --  0.58* 0.47* 0.49*  CALCIUM 7.6*  --  8.0*  --  8.0* 8.4* 8.1*  MG  --   --  2.5*  --  2.4 2.2 2.3  PHOS  --   --  3.4  --  3.1 3.0 3.8   < > = values in this interval not displayed.   GFR: Estimated Creatinine Clearance: 106.6 mL/min (A) (by C-G formula based on SCr of 0.49 mg/dL (L)). Recent Labs  Lab 02/19/19 0415 02/19/19 1001 02/20/19 0550 02/21/19 0458 02/22/19 0442 02/23/19 0328  PROCALCITON  --  2.11  --   --   --   --   WBC   < >  --  20.5* 19.4* 14.6* 13.9*   < > = values in this interval not displayed.    Liver Function Tests: No results for input(s): AST, ALT, ALKPHOS, BILITOT, PROT, ALBUMIN in the last 168 hours. No results for input(s): LIPASE, AMYLASE in the last 168 hours. No results for input(s): AMMONIA in the last 168 hours.  ABG    Component Value Date/Time   PHART 7.382 02/20/2019 0928   PCO2ART 61.8 (H) 02/20/2019 0928   PO2ART 71.0 (L) 02/20/2019 0928   HCO3 36.9 (H) 02/20/2019 0928   TCO2 39 (H) 02/20/2019 0928   ACIDBASEDEF 2.0 02/08/2019 1444   O2SAT 93.0 02/20/2019 0928     Coagulation  Profile: No results for input(s): INR, PROTIME in the last 168 hours.  Cardiac Enzymes: No results for input(s): CKTOTAL, CKMB, CKMBINDEX, TROPONINI in the last 168 hours.  HbA1C: Hgb A1c MFr Bld  Date/Time Value Ref Range Status  02/17/2019 04:25 AM 11.7 (H) 4.8 - 5.6 % Final    Comment:    (NOTE) Pre diabetes:          5.7%-6.4% Diabetes:              >6.4% Glycemic control for   <7.0% adults with diabetes   02/06/2019 06:54 AM 12.6 (H) 4.8 - 5.6 % Final    Comment:    (NOTE) Pre diabetes:          5.7%-6.4% Diabetes:              >6.4% Glycemic control for   <7.0% adults with diabetes     CBG: Recent Labs  Lab 02/22/19 1534 02/22/19 1925 02/22/19 2353 02/23/19 0332 02/23/19 0732  GLUCAP 93 136* 154* 144* 96      This patient is critically ill with multiple organ system failure which requires frequent high complexity decision making, assessment, support, evaluation, and titration of therapies. This was completed through the application of advanced monitoring technologies and extensive interpretation of multiple databases. During this encounter critical care time was devoted to patient care services described in this note for 50 minutes.

## 2019-02-24 LAB — GLUCOSE, CAPILLARY
Glucose-Capillary: 146 mg/dL — ABNORMAL HIGH (ref 70–99)
Glucose-Capillary: 91 mg/dL (ref 70–99)
Glucose-Capillary: 96 mg/dL (ref 70–99)
Glucose-Capillary: 121 mg/dL — ABNORMAL HIGH (ref 70–99)
Glucose-Capillary: 109 mg/dL — ABNORMAL HIGH (ref 70–99)
Glucose-Capillary: 86 mg/dL (ref 70–99)

## 2019-02-24 LAB — VANCOMYCIN, TROUGH: Vancomycin Tr: 12 ug/mL — ABNORMAL LOW (ref 15–20)

## 2019-02-24 LAB — CULTURE, BLOOD (ROUTINE X 2)
Culture: NO GROWTH
Culture: NO GROWTH
Special Requests: ADEQUATE

## 2019-02-24 LAB — BASIC METABOLIC PANEL
Anion gap: 8 (ref 5–15)
BUN: 28 mg/dL — ABNORMAL HIGH (ref 6–20)
CO2: 35 mmol/L — ABNORMAL HIGH (ref 22–32)
Calcium: 8.1 mg/dL — ABNORMAL LOW (ref 8.9–10.3)
Chloride: 101 mmol/L (ref 98–111)
Creatinine, Ser: 0.41 mg/dL — ABNORMAL LOW (ref 0.61–1.24)
GFR calc Af Amer: >60 mL/min (ref >60)
GFR calc non Af Amer: >60 mL/min (ref >60)
Glucose, Bld: 156 mg/dL — ABNORMAL HIGH (ref 70–99)
Potassium: 3.4 mmol/L — ABNORMAL LOW (ref 3.5–5.1)
Sodium: 144 mmol/L (ref 135–145)

## 2019-02-24 LAB — CBC
HCT: 30.1 % — ABNORMAL LOW (ref 39.0–52.0)
Hemoglobin: 8.8 g/dL — ABNORMAL LOW (ref 13.0–17.0)
MCH: 27.3 pg (ref 26.0–34.0)
MCHC: 29.2 g/dL — ABNORMAL LOW (ref 30.0–36.0)
MCV: 93.5 fL (ref 80.0–100.0)
Platelets: 611 10*3/uL — ABNORMAL HIGH (ref 150–400)
RBC: 3.22 MIL/uL — ABNORMAL LOW (ref 4.22–5.81)
RDW: 14.3 % (ref 11.5–15.5)
WBC: 15.3 10*3/uL — ABNORMAL HIGH (ref 4.0–10.5)
nRBC: 0.3 % — ABNORMAL HIGH (ref 0.0–0.2)

## 2019-02-24 LAB — VANCOMYCIN, PEAK: Vancomycin Pk: 26 ug/mL — ABNORMAL LOW (ref 30–40)

## 2019-02-24 LAB — MAGNESIUM: Magnesium: 2.3 mg/dL (ref 1.7–2.4)

## 2019-02-24 LAB — PHOSPHORUS: Phosphorus: 3.4 mg/dL (ref 2.5–4.6)

## 2019-02-24 MED ORDER — SENNOSIDES-DOCUSATE SODIUM 8.6-50 MG PO TABS
1.0000 | ORAL_TABLET | Freq: Two times a day (BID) | ORAL | Status: DC
  Administered 2019-02-25 – 2019-03-23 (×42): 1
  Filled 2019-02-24 (×42): qty 1

## 2019-02-24 MED ORDER — POTASSIUM CHLORIDE 20 MEQ/15ML (10%) PO SOLN
20.0000 meq | ORAL | Status: AC
  Administered 2019-02-24 (×2): 20 meq
  Filled 2019-02-24 (×2): qty 15

## 2019-02-24 MED ORDER — POLYETHYLENE GLYCOL 3350 17 G PO PACK
17.0000 g | PACK | Freq: Every day | ORAL | Status: DC
  Administered 2019-02-25 – 2019-03-23 (×17): 17 g
  Filled 2019-02-24 (×18): qty 1

## 2019-02-24 NOTE — Progress Notes (Signed)
Pharmacy Antibiotic Note  Carder Yin is a 54 y.o. male admitted on 02/06/2019 with Covid-19 pneumonia. Chest xray shows bilateral opacities, concern for bialteral pneumonia. Trach aspirate shows corynebacterium, though not usually pathogenic, will plan to treat given severity of acute illness. SCr 0.41.   Vancomycin levels drawn  SCr 0.4, ke 0.91, AUC 487 > this is therapeutic  Plan: Cefepime 2 g IV q8h Vancomycin 1250 mg IV every 12 hours Monitor renal function, culture results, and clinical status   Height: 5\' 6"  (167.6 cm) Weight: 173 lb 11.6 oz (78.8 kg) IBW/kg (Calculated) : 63.8  Temp (24hrs), Avg:98.5 F (36.9 C), Min:98 F (36.7 C), Max:99 F (37.2 C)  Recent Labs  Lab 02/20/19 0550 02/21/19 0458 02/22/19 0442 02/23/19 0328 02/23/19 2337 02/24/19 0312 02/24/19 0808  WBC 20.5* 19.4* 14.6* 13.9*  --  15.3*  --   CREATININE 0.71 0.58* 0.47* 0.49*  --  0.41*  --   VANCOTROUGH  --   --   --   --   --   --  12*  VANCOPEAK  --   --   --   --  26*  --   --       Antimicrobials this admission: Zosyn 2/6 >> 2/9 Vancomycin 2/6 >> Cefepime 2/9 >>   Microbiology results: 2/5 BCx >> ngtd 2/5 RCx >> diptheroids 1/23 BCx negative 1/23 MRSA pcr negative   2/23 02/24/2019 11:12 AM]

## 2019-02-24 NOTE — Progress Notes (Signed)
NAME:  Andrew Bruce, MRN:  [id number], DOB:  December 20, 1965, LOS: 18 ADMISSION DATE:  02/06/2019, CONSULTATION DATE:  02/06/19 REFERRING MD:  ER, CHIEF COMPLAINT:  SOB   Brief History   [name] without a past medical history admitted 1/23 with 48 hours of worsening shortness of breath in the context of a 10-day Covid illness (dry cough, lost taste/smell).  Chest x-ray and vitals are consistent with a worsening COVID ARDS.  Admitted for observation.  Developed worsening shortness of breath and increasing O2 needs. Decompensated requiring intubation 1/24.  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   Past Medical History  None known prior to admit   Significant Hospital Events   1/23 Admit  1/24 Decompensated, tx to ICU, intubated 1/25 b/l chest tubes placed 1/25 CVC 2/6 - rt chest tube connection changed 2/9 - Still no BM. Afebrile.  Left chest tube connection chnaged   Consults:  Neurology  Palliative care  Procedures:  ETT 1/24 >> Bilateral chest tube placement 1/25 >> R Femoral TLC 1/25 >>  RUE PICC  Significant Diagnostic Tests:  CT Head 2/2 >> no acute intracranial hemorrhage, large area of infarct in the left occipital lobe, likely subacute CTA Chest 2/3 >> LLL segmental PE, no CT evidence of right heart strain, multifocal PNA, background emphysema and probable degree of interstitial fibrosis ECHO 2/3 >> LVEF 55-60%, no WMA, hypokinetic right ventricular free wall, McConnells sign present.  RV findings consistent with acute cor pulmonale, small pericardial effusion, mildly elevated pulmonary artery systolic pressure  Micro Data:  COVID 1/12 >> positive  PCT 1/23 >> Neg 2/5 blood cx >> 2/6 sputum cx>> diphtheroids  Antimicrobials:  Remdesivir 02/06/19>>1/28 Dexamethasone 02/06/19>>2/2  xxxxxxxxxxxxxx vanc 2/5>>2/9 Zosyn 2/5>>  Interim history/subjective:    02/24/2019 -  Palliative crae meeting with  family today but with critical care discussion with wife yesterday continued aggressive care and decision on tracheostomy only next week.. 40% fio2, 10 peep.  Currently on propofol and fentanyl infusion.  Not on pressors.  On tube feeds.  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  afbrile snce 2/7  TGL improved to 161  Objective   Blood pressure 113/70, pulse 100, temperature 98.7 F (37.1 C), temperature source Oral, resp. rate (!) 31, height 5\' 6"  (1.676 m), weight 78.8 kg, SpO2 96 %.    Vent Mode: PRVC FiO2 (%):  [40 %] 40 % Set Rate:  [30 bmp] 30 bmp Vt Set:  [380 mL] 380 mL PEEP:  [10 cmH20-12 cmH20] 10 cmH20 Plateau Pressure:  [21 cmH20-27 cmH20] 21 cmH20   Intake/Output Summary (Last 24 hours) at 02/24/2019 0827 Last data filed at 02/24/2019 0800 Gross per 24 hour  Intake 3340.92 ml  Output 1797 ml  Net 1543.92 ml   Filed Weights   02/19/19 0500 02/20/19 0453 02/21/19 0404  Weight: 77.8 kg 78.6 kg 78.8 kg     General Appearance:  Looks criticall ill Head:  Normocephalic, without obvious abnormality, atraumatic Eyes:  PERRL -yes, conjunctiva/corneas -clear Ears:  Normal external ear canals, both ears Nose:  G tube -no Throat:  ETT TUBE -yes, OG tube -yes Neck:  Supple,  No enlargement/tenderness/nodules Lungs: Clear to auscultation bilaterally, Ventilator   Synchrony -40% FiO2, PEEP 10 and synchronous with the ventilator Heart:  S1 and S2 normal, no murmur, CVP -no.  Pressors -no Abdomen:  Soft, no masses, no organomegaly Genitalia / Rectal:  Not done Extremities:  Extremities-intact with embolic  changes to the toes with developing gangrene Skin:  ntact in exposed areas . Sacral area -not examined Neurologic:  Sedation -propofol and fentanyl-> RASS --3.       Resolved Hospital Problem list   Oliguria, hypernatremia  Assessment & Plan:   Acute hypoxemic respiratory failure due to COVID-19 pneumonia, ARDS, PE.  Acute left lower lobe  pneumonia.  02/24/2019 - 40% fio2, peep 10. Meets indication for trach  Plan -Continue low tidal volume ventilation, 4 to 80 cc/kg ideal body weight with goal plateau less than 30 and driving pressure 63-OV is currently at goal -Continue titrating PEEP and FiO2 per ARDS ladder.  Doing well enough to start reducing sedation. -Sedation as required to maintain ventilator stability, okay to start decreasing. -Continue empiric Vanco and Zosyn to complete a 7-day course -Prevention protocol   -Previously completed remdesivir and dexamethasone. -Will require tracheostomy soon family continues to wish for ongoing aggressive care.    Bilateral pneumothoraces  02/24/2019 - lungs up on cxr yesterday  Pl;an -Con't chest tube to suction - CXR 2/11.  Pulmonary embolism- segmental on CTA chest 2/3 ordered as part of abnormal echo. Provoked by covid.   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 Evidence of toe ischemia and microthrombi. - Noted on CTA chest 2/3. High risk for embolizing but intervention is more risky at this time.  Plan -Continue full dose anticoagulation - monitor gangrene on ties  Acute right heart failure related to ARDS and PE -con't supportive care  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      02/24/2019  - no acute change  -Con't aspirin & AC -working on decreasing sedation as vent weaning allows    Steroid-induced Hyperglycemia-now improved control -con't levemir  -Sliding scale insulin every 4 hours as needed -Goal BG 140-180 while admitted to ICU  At Risk Malnutrition -Continue enteral nutrition  Constipation -Continue oral naloxone for opioid-induced constipation -Continue current bowel regimen  Hypertriglyceridemia due to propofol-resolved on lower doses and better controlled blood glucose  -Try to  limit propofol to max dose 30   Prolonged Multiorgan dysfunction-guarded prognosis - - pall care meeting 02/24/2019  Best practice:  Diet: NPO, TF Pain/Anxiety/Delirium protocol (if indicated): in place VAP protocol (if indicated): Ordered DVT prophylaxis: lovenox GI prophylaxis: Pepcid Glucose control: SSI and Levemir Mobility: Bedrest Code Status: Full Code  - goals of care pending with palliative care 02/24/2019  Family Communication:  - wife updated that overall he is same. Encouraged to attend meetnig with palliative.  care later today. Also, updated one 04/24/2019 who knows more Elease Hashimoto.  Explained to [place] - no indication for covid testing. Explained that what we are dealing with is ravages of covid .   Disposition: ICU     ATTESTATION & SIGNATURE   The patient Andrew Bruce is critically ill with multiple organ systems failure and requires high complexity decision making for assessment and support, frequent evaluation and titration of therapies, application of advanced monitoring technologies and extensive interpretation of multiple databases.   Critical Care Time devoted to patient care services described in this note is  45  Minutes. This time reflects time of care of this signee Dr [name]. This critical care time does not reflect procedure time, or teaching time or supervisory time of PA/NP/Med student/Med Resident etc  but could involve care discussion time     Dr. Brand Males, M.D., [facility].C.P Pulmonary and Critical Care Medicine Staff Physician [place] [facility] Pager: [phone], If no answer or between  15:00h - 7:00h: call [phone]  02/24/2019 8:27 AM     LABS    PULMONARY Recent Labs  Lab 02/20/19 0928  PHART 7.382  PCO2ART 61.8*  PO2ART 71.0*  HCO3 36.9*  TCO2 39*  O2SAT 93.0    CBC Recent Labs  Lab 02/22/19 0442 02/23/19 0328 02/24/19 0312  HGB 9.0* 8.6* 8.8*  HCT 30.3* 29.6*  30.1*  WBC 14.6* 13.9* 15.3*  PLT 492* 547* 611*    COAGULATION No results for input(s): INR in the last 168 hours.  CARDIAC  No results for input(s): TROPONINI in the last 168 hours. No results for input(s): PROBNP in the last 168 hours.   CHEMISTRY Recent Labs  Lab 02/20/19 0550 02/20/19 0550 02/20/19 1027 02/20/19 0928 02/21/19 0458 02/21/19 0458 02/22/19 0442 02/22/19 0442 02/23/19 0328 02/24/19 0312  NA 139   < > 139  --  138  --  145  --  145 144  K 3.5   < > 3.4*   < > 3.4*   < > 3.5   < > 3.0* 3.4*  CL 97*  --   --   --  95*  --  101  --  98 101  CO2 32  --   --   --  32  --  34*  --  38* 35*  GLUCOSE 148*  --   --   --  257*  --  103*  --  143* 156*  BUN 34*  --   --   --  28*  --  31*  --  30* 28*  CREATININE 0.71  --   --   --  0.58*  --  0.47*  --  0.49* 0.41*  CALCIUM 8.0*  --   --   --  8.0*  --  8.4*  --  8.1* 8.1*  MG 2.5*  --   --   --  2.4  --  2.2  --  2.3 2.3  PHOS 3.4  --   --   --  3.1  --  3.0  --  3.8 3.4   < > = values in this interval not displayed.   Estimated Creatinine Clearance: 106.6 mL/min (A) (by C-G formula based on SCr of 0.41 mg/dL (L)).   LIVER No results for input(s): AST, ALT, ALKPHOS, BILITOT, PROT, ALBUMIN, INR in the last 168 hours.   INFECTIOUS Recent Labs  Lab 02/19/19 1001  PROCALCITON 2.11     ENDOCRINE CBG (last 3)  Recent Labs    02/23/19 2335 02/24/19 0308 02/24/19 0746  GLUCAP 166* 146* 91         IMAGING x48h  - image(s) personally visualized  -   highlighted in bold DG CHEST PORT 1 VIEW  Result Date: 02/23/2019 CLINICAL DATA:  Endotracheal tube. Respiratory distress. COVID pneumonia EXAM: PORTABLE CHEST 1 VIEW COMPARISON:  02/23/2019 at 533 hours FINDINGS: endotracheal tube repositioned to 2.5 cm from carina. NG tube extends the stomach. RIGHT PICC line unchanged. Bilateral chest tubes in place. No pneumothorax. Dense bibasilar airspace disease. IMPRESSION: 1. Endotracheal tube in good position.  2. Bilateral chest tubes without pneumothorax. 3. Dense bibasilar airspace disease unchanged. Electronically Signed   By: Helane Gunther.D.  On: 02/23/2019 10:53   DG Chest Port 1 View  Result Date: 02/23/2019 CLINICAL DATA:  COVID-19, respiratory distress EXAM: PORTABLE CHEST 1 VIEW COMPARISON:  Radiograph 02/20/2019 FINDINGS: *Endotracheal tube terminates in the upper trachea, 8.6 cm from the carina. *Transesophageal tube tip and side port distal to the GE junction. *Right upper extremity PICC terminates at the superior cavoatrial junction. *Bilateral chest tubes are in place. *Telemetry leads overlie the chest. Persistent mixed interstitial and consolidative airspace opacities throughout both lungs with some increasing opacity towards the lung bases. Cardiomediastinal contours are stable. No visible pneumothorax suspect at least trace right effusion. No left effusion. No acute osseous or soft tissue abnormality. Degenerative changes are present in the imaged spine and shoulders. IMPRESSION: Mixed interstitial and airspace opacities, slightly increasing towards the lung base. Could reflect with worsening disease, edema or atelectasis. Support devices as above. Consider advancing the endotracheal tube to the mid trachea (approximately 3 cm). Electronically Signed   By: Kreg Shropshire M.D.   On: 02/23/2019 06:09

## 2019-02-24 NOTE — Plan of Care (Signed)
  Problem: Clinical Measurements: Goal: Diagnostic test results will improve Outcome: Progressing Goal: Respiratory complications will improve Outcome: Progressing   Problem: Nutrition: Goal: Adequate nutrition will be maintained Outcome: Progressing   Problem: Elimination: Goal: Will not experience complications related to bowel motility Outcome: Progressing Goal: Will not experience complications related to urinary retention Outcome: Progressing   Problem: Pain Managment: Goal: General experience of comfort will improve Outcome: Progressing   

## 2019-02-24 NOTE — Progress Notes (Signed)
Palliative:  HPI: 54 y.o. male  with past medical history of none known admitted on 02/06/2019 with shortness of breath with COVID ARDS, acute kidney injury, mild transaminitis. Required intubation 1/24 and hospitalization complicated by multiple strokes, bilateral pneumothorax requiring chest tube placement, and aortic clot. He has been unable to wean to extubate with poor neurological status.   I met today with Mr. Davtyan' wife, daughter, and 2 siblings along with Garciella to translate conversation. We discussed in full Mr. Tinoco's critical state and poor progress. We discussed that his prognosis remains very guarded and that he very likely may not improve or could even decline further. We discussed options to move forward with aggressive would include tracheostomy vs plans for one way extubation. We discussed why ETT could not stay any longer. I did explain that there is a chance that Mr. Igoe could continue to decline even with tracheostomy and that this does not reverse the damage to his lungs from COVID or his brain from the stroke. I was clear that he may never return home. We also discussed that if he continues with no improvement that there may come a time when continuing life support with poor quality of life would not be best for Mr. Bevens.   Overall family is still hopeful that he will ultimately improve even if this takes time. They would like more time and have opted for full aggressive care. We did also discuss code status and that if this were to happen then his prognosis would be much worse. They have requested all efforts to keep Mr. Dettmann alive at this time.   Many question from family were addressed to the best of my ability. Emotional support provided. I will continue to follow and support family as well as have ongoing goals of care discussions. Updated RN and Dr. Ramaswamy.   Exam: Sedated on vent. Unable to tolerate sedation weaning d/t vent dyssynchrony per RN. Does  not follow commands, open eyes, or move at all. No distress. 40% FiO2. Abd soft. Bilateral toes with embolic color changes.   Plan: - Family have opted to pursue tracheostomy and full aggressive care.   60 min   , NP Palliative Medicine Team Pager [phone] (Please see [email or web address] for schedule) Team Phone [phone]    Greater than 50%  of this time was spent counseling and coordinating care related to the above assessment and plan  

## 2019-02-24 NOTE — Progress Notes (Signed)
eLink Physician-Brief Progress Note Patient Name: Andrew Bruce DOB: [birth year] MRN: [id number]   Date of Service  02/24/2019  HPI/Events of Note  K + 3.4  eICU Interventions  KCL 20 meq via NG tube Q 4 hours x 2 doses.        Thomasene Lot Sehar Sedano 02/24/2019, 4:28 AM

## 2019-02-25 ENCOUNTER — Inpatient Hospital Stay (HOSPITAL_COMMUNITY): Payer: HRSA Program

## 2019-02-25 LAB — CBC WITH DIFFERENTIAL/PLATELET
Abs Immature Granulocytes: 1 10*3/uL — ABNORMAL HIGH (ref 0.00–0.07)
Basophils Absolute: 0.4 10*3/uL — ABNORMAL HIGH (ref 0.0–0.1)
Basophils Relative: 3 %
Eosinophils Absolute: 0.6 10*3/uL — ABNORMAL HIGH (ref 0.0–0.5)
Eosinophils Relative: 4 %
HCT: 30 % — ABNORMAL LOW (ref 39.0–52.0)
Hemoglobin: 8.7 g/dL — ABNORMAL LOW (ref 13.0–17.0)
Lymphocytes Relative: 7 %
Lymphs Abs: 1 10*3/uL (ref 0.7–4.0)
MCH: 27.4 pg (ref 26.0–34.0)
MCHC: 29 g/dL — ABNORMAL LOW (ref 30.0–36.0)
MCV: 94.6 fL (ref 80.0–100.0)
Metamyelocytes Relative: 2 %
Monocytes Absolute: 0.1 10*3/uL (ref 0.1–1.0)
Monocytes Relative: 1 %
Myelocytes: 4 %
Neutro Abs: 11.2 10*3/uL — ABNORMAL HIGH (ref 1.7–7.7)
Neutrophils Relative %: 78 %
Platelets: 649 10*3/uL — ABNORMAL HIGH (ref 150–400)
Promyelocytes Relative: 1 %
RBC: 3.17 MIL/uL — ABNORMAL LOW (ref 4.22–5.81)
RDW: 14.5 % (ref 11.5–15.5)
WBC: 14.4 10*3/uL — ABNORMAL HIGH (ref 4.0–10.5)
nRBC: 0.5 % — ABNORMAL HIGH (ref 0.0–0.2)
nRBC: 1 /100 WBC — ABNORMAL HIGH

## 2019-02-25 LAB — GLUCOSE, CAPILLARY
Glucose-Capillary: 91 mg/dL (ref 70–99)
Glucose-Capillary: 87 mg/dL (ref 70–99)
Glucose-Capillary: 120 mg/dL — ABNORMAL HIGH (ref 70–99)
Glucose-Capillary: 97 mg/dL (ref 70–99)
Glucose-Capillary: 115 mg/dL — ABNORMAL HIGH (ref 70–99)
Glucose-Capillary: 173 mg/dL — ABNORMAL HIGH (ref 70–99)

## 2019-02-25 LAB — MAGNESIUM: Magnesium: 2.4 mg/dL (ref 1.7–2.4)

## 2019-02-25 LAB — COMPREHENSIVE METABOLIC PANEL
ALT: 31 U/L (ref 0–44)
AST: 31 U/L (ref 15–41)
Albumin: 1.4 g/dL — ABNORMAL LOW (ref 3.5–5.0)
Alkaline Phosphatase: 119 U/L (ref 38–126)
Anion gap: 9 (ref 5–15)
BUN: 32 mg/dL — ABNORMAL HIGH (ref 6–20)
CO2: 33 mmol/L — ABNORMAL HIGH (ref 22–32)
Calcium: 8.2 mg/dL — ABNORMAL LOW (ref 8.9–10.3)
Chloride: 102 mmol/L (ref 98–111)
Creatinine, Ser: 0.5 mg/dL — ABNORMAL LOW (ref 0.61–1.24)
GFR calc Af Amer: >60 mL/min (ref >60)
GFR calc non Af Amer: >60 mL/min (ref >60)
Glucose, Bld: 121 mg/dL — ABNORMAL HIGH (ref 70–99)
Potassium: 4 mmol/L (ref 3.5–5.1)
Sodium: 144 mmol/L (ref 135–145)
Total Bilirubin: 0.4 mg/dL (ref 0.3–1.2)
Total Protein: 5.9 g/dL — ABNORMAL LOW (ref 6.5–8.1)

## 2019-02-25 LAB — CK TOTAL AND CKMB (NOT AT ARMC)
CK, MB: 2.1 ng/mL (ref 0.5–5.0)
Relative Index: 2 (ref 0.0–2.5)
Total CK: 107 U/L (ref 49–397)

## 2019-02-25 LAB — LACTIC ACID, PLASMA: Lactic Acid, Venous: 0.9 mmol/L (ref 0.5–1.9)

## 2019-02-25 LAB — PHOSPHORUS: Phosphorus: 5 mg/dL — ABNORMAL HIGH (ref 2.5–4.6)

## 2019-02-25 NOTE — Progress Notes (Signed)
RT NOTE: RT transported patient on ventilator from room 3M03 to CT and then to room 2M09 with no apparent complications.  Vitals are stable. RT will continue to monitor.

## 2019-02-25 NOTE — Progress Notes (Signed)
NAME:  Andrew Bruce, MRN:  [id number], DOB:  [birth year], LOS: 19 ADMISSION DATE:  02/06/2019, CONSULTATION DATE:  02/06/19 REFERRING MD:  ER, CHIEF COMPLAINT:  SOB   Brief History   [name] without a past medical history admitted 1/23 with 48 hours of worsening shortness of breath in the context of a 10-day Covid illness (dry cough, lost taste/smell).  Chest x-ray and vitals are consistent with a worsening COVID ARDS.  Admitted for observation.  Developed worsening shortness of breath and increasing O2 needs. Decompensated requiring intubation 1/24.  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   Past Medical History  None known prior to admit   Significant Hospital Events   1/23 Admit  1/24 Decompensated, tx to ICU, intubated 1/25 b/l chest tubes placed 1/25 CVC  2/3 - flomax started  2/6 - rt chest tube connection changed  2/9 - Still no BM. Afebrile.  Left chest tube connection changed  2/10 -  Palliative crae meeting  -> full code, full aggressive care including trach., lact. 40% fio2, 10 peep.  Currently on propofol and fentanyl infusion.  Not on pressors.  On tube feeds.  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 afbrile snce 2/7. TGL improved to 161   Consults:  Neurology  Palliative care  Procedures:  ETT 1/24 >> Bilateral chest tube placement 1/25 >> R Femoral TLC 1/25 >>  RUE PICC  Significant Diagnostic Tests:  CT Head 2/2 >> no acute intracranial hemorrhage, large area of infarct in the left occipital lobe, likely subacute  CTA Chest 2/3 >> LLL segmental PE, no CT evidence of right heart strain, multifocal PNA, background emphysema and probable degree of interstitial fibrosis  ECHO 2/3 >> LVEF 55-60%, no WMA, hypokinetic right ventricular free wall, McConnells sign present.  RV findings consistent with acute cor pulmonale, small pericardial effusion, mildly elevated pulmonary  artery systolic pressure  Micro Data:  COVID 1/12 >> positive  PCT 1/23 >> Neg 2/5 blood cx >> negative 2/6 sputum cx>> abundant diphtheroids Corynebacterium species   Antimicrobials:  Remdesivir 02/06/19>>1/28 Dexamethasone 02/06/19>>2/2  xxxxxxxxxxxxxx vanc 2/5>> (2/12 - diphtheroids_ Zosyn 2/5>> 2/9, Cefepime 2/9 >>2/11)   Interim history/subjective:    02/25/2019 -afebrile since February 21, 2019.  Has third spacing with bilateral chest tubes.  Is on propofol infusion along with fentanyl infusion and scheduled oxycodone.  RASS sedation score -3.  Without it during wake up assessment patient gets extremely tachypneic according to the nurse.  Making good urine.  Not on pressors.  CK 107 and lactic acid normal.  RN feels that he can be transferred to regular medical ICU because he is greater than 21 days since positive Covid test  Objective   Blood pressure 101/63, pulse (!) 112, temperature 99.4 F (37.4 C), temperature source Oral, resp. rate (!) 31, height 5\' 6"  (1.676 m), weight 83.1 kg, SpO2 95 %.    Vent Mode: PRVC FiO2 (%):  [40 %] 40 % Set Rate:  [30 bmp] 30 bmp Vt Set:  [380 mL] 380 mL PEEP:  [10 cmH20] 10 cmH20 Plateau Pressure:  [22 cmH20-25 cmH20] 24 cmH20   Intake/Output Summary (Last 24 hours) at 02/25/2019 0921 Last data filed at 02/25/2019 0800 Gross per 24 hour  Intake 2765.41 ml  Output 1590 ml  Net 1175.41 ml   Filed Weights   02/20/19 0453 02/21/19 0404 02/25/19 0500  Weight: 78.6 kg 78.8 kg 83.1 kg    General Appearance:  Looks criticall ill, decnditoned,  3rd spacing + Head:  Normocephalic, without obvious abnormality, atraumatic Eyes:  PERRL - yes, conjunctiva/corneas - clear     Ears:  Normal external ear canals, both ears Nose:  G tube - no Throat:  ETT TUBE - yes , OG tube - yes Neck:  Supple,  No enlargement/tenderness/nodules Lungs: Clear to auscultation bilaterally, Ventilator   Synchrony - yes, 40% fio2, peep peep 8,  Heart:  S1 and S2  normal, no murmur, CVP - no.  Pressors - no Abdomen:  Soft, no masses, no organomegaly Genitalia / Rectal:  Not done Extremities:  Extremities- intact with edema with embolic toes Skin:  ntact in exposed areas . Sacral area - not examined Neurologic:  Sedation - diprivan and fent gtt -> RASS - -3      Resolved Hospital Problem list   Oliguria, hypernatremia Completed COVID Tx Assessment & Plan:   Acute hypoxemic respiratory failure due to COVID-19 pneumonia, ARDS, PE.  Acute left lower lobe pneumonia.   02/25/2019 - > does not meet criteria for SBT/Extubation in setting of Acute Respiratory Failure due to ARDS and high peep. Meets indication for trach   Plan -Continue PRVC  -  low tidal volume ventilation, 4 to 80 cc/kg ideal body weight with goal plateau less than 30 and driving pressure 09-GG is currently at goal  -Continue titrating PEEP and FiO2 per ARDS ladder.   -Sedation as required to maintain ventilator stability, okay to start decreasing. -Continue empiric Vanco and Zosyn to complete a 7-day course -VAP Prevention protocol   -Will require tracheostomy  -informed Dr Tonia Brooms who is back Monday 03/01/2019 and Dr Katrinka Blazing who is back 02/21/2019  Bilateral pneumothoraces  02/25/2019 -lungs appear up on chest x-ray but radiologist is questioning medial lucency on the right side with possible pneumomediastinum  Pl;an -Con't chest tube to suction -Get CT chest without contrast 02/25/2019.  Pulmonary embolism- segmental on CTA chest 2/3 ordered as part of abnormal echo. Provoked by covid.   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 Evidence of toe ischemia and microthrombi. - Noted on CTA chest 2/3. High risk for embolizing but intervention is more risky at this time.  Plan -Continue full dose anticoagulation - monitor gangrene on toes  Acute right heart failure related  to ARDS and PE -con't supportive care  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      02/25/2019  - no acute change  -Con't aspirin  - continue full dose AC - lopvenox 75mg  bid -working on decreasing sedation as vent weaning allows - continue lipitor   Sedation needs on vent  02/25/2019 - RASS -3 on scheuled klonoping, gabapentin, seroqell diprivan gtt and fent gtt (none of this is home med)    - CK/Lactat normal and no evidence of diprivan toxicity. TGL 160-  Plan - dc gabapentnio  - diprivan gtt - needs regular TGL check (was high)  - fent gtt cntniue  - oxycodone scheduled - klonopin to continue  - can lighten more after trach and as tolerated  - RASS goal 0 to -2  COVID -19 - s/p remedesviri and decadron  Plan  - monitor   Possible VAP - cornyebacterium 02/20/2019   - /211/2021 - afebrile since 02/20/2019  Plan  - dc cefepime  - continue vanc  throuhg 02/26/2019 for above   Steroid-induced Hyperglycemia-now improved control -con't levemir  -Sliding scale insulin every 4 hours as needed -Goal BG 140-180 while admitted to ICU    Constipation -Continue current bowel regimen  Anemia critical illness  02/25/2019 - no bleeding  Plan  - - PRBC for hgb </= 6.9gm%    - exceptions are   -  if ACS susepcted/confirmed then transfuse for hgb </= 8.0gm%,  or    -  active bleeding with hemodynamic instability, then transfuse regardless of hemoglobin value   At at all times try to transfuse 1 unit prbc as possible with exception of active hemorrhage    Best practice:  Diet: NPO, TF. MVT, zinc, folic acid Pain/Anxiety/Delirium protocol (if indicated): in place VAP protocol (if indicated): Ordered DVT prophylaxis: lovenox Rx dose of 75mg  bid with pharmacy monitoring GI prophylaxis: Pepcid Glucose control: SSI and Levemir Mobility: Bedrest GU - on foley. Also flomax since 2/3 which is not a home med -> will dc flomasx Code  Status: Full Code   Pall care 02/24/2019  Family Communication:  - wife Myrna Martinexz updated over phone     Disposition: ICU but can move from 49m to 65M       ATTESTATION & SIGNATURE   The patient Dorrien Grunder is critically ill with multiple organ systems failure and requires high complexity decision making for assessment and support, frequent evaluation and titration of therapies, application of advanced monitoring technologies and extensive interpretation of multiple databases.   Critical Care Time devoted to patient care services described in this note is  45  Minutes. This time reflects time of care of this signee Dr [name]. This critical care time does not reflect procedure time, or teaching time or supervisory time of PA/NP/Med student/Med Resident etc but could involve care discussion time     Dr. Kalman Shan, M.D., [facility].C.P Pulmonary and Critical Care Medicine Staff Physician [facility] Pager: [phone], If no answer or between  15:00h - 7:00h: call [phone]  02/25/2019 10:00 AM      LABS    PULMONARY Recent Labs  Lab 02/20/19 0928  PHART 7.382  PCO2ART 61.8*  PO2ART 71.0*  HCO3 36.9*  TCO2 39*  O2SAT 93.0    CBC Recent Labs  Lab 02/23/19 0328 02/24/19 0312 02/25/19 0500  HGB 8.6* 8.8* 8.7*  HCT 29.6* 30.1* 30.0*  WBC 13.9* 15.3* 14.4*  PLT 547* 611* 649*    COAGULATION No results for input(s): INR in the last 168 hours.  CARDIAC  No results for input(s): TROPONINI in the last 168 hours. No results for input(s): PROBNP in the last 168 hours.   CHEMISTRY Recent Labs  Lab 02/21/19 0458 02/21/19 0458 02/22/19 0442 02/22/19 0442 02/23/19 0328 02/23/19 0328 02/24/19 0312 02/25/19 0500  NA 138  --  145  --  145  --  144 144  K 3.4*   < > 3.5   < > 3.0*   < > 3.4* 4.0  CL 95*  --  101  --  98  --  101 102  CO2 32  --  34*  --  38*  --  35* 33*  GLUCOSE 257*  --   103*  --  143*  --  156* 121*  BUN 28*  --  31*  --  30*  --  28* 32*  CREATININE 0.58*  --  0.47*  --  0.49*  --  0.41* 0.50*  CALCIUM 8.0*  --  8.4*  --  8.1*  --  8.1* 8.2*  MG 2.4  --  2.2  --  2.3  --  2.3 2.4  PHOS 3.1  --  3.0  --  3.8  --  3.4 5.0*   < > = values in this interval not displayed.   Estimated Creatinine Clearance: 109.2 mL/min (A) (by C-G formula based on SCr of 0.5 mg/dL (L)).   LIVER Recent Labs  Lab 02/25/19 0500  AST 31  ALT 31  ALKPHOS 119  BILITOT 0.4  PROT 5.9*  ALBUMIN 1.4*     INFECTIOUS Recent Labs  Lab 02/19/19 1001 02/25/19 0000  LATICACIDVEN  --  0.9  PROCALCITON 2.11  --      ENDOCRINE CBG (last 3)  Recent Labs    02/24/19 2306 02/25/19 0307 02/25/19 0728  GLUCAP 86 91 87         IMAGING x48h  - image(s) personally visualized  -   highlighted in bold DG CHEST PORT 1 VIEW  Result Date: 02/25/2019 CLINICAL DATA:  Intubation EXAM: PORTABLE CHEST 1 VIEW COMPARISON:  Radiograph 02/23/2019, CT 02/17/2019 FINDINGS: *Endotracheal tube terminates 4 cm from the carina. *Transesophageal tube tip and side port below the GE junction, beyond the level of imaging. *Bilateral chest tubes are present. *Right upper extremity PICC tip terminates at the superior cavoatrial junction Persistent mixed interstitial and patchy airspace opacities present in both lungs with slightly diminished lung volumes. There is new linear medial lucency in the right lung base which is indeterminate, could reflect right pleural gas along the posterior junction line versus air within the esophagus or other mediastinal gas. No other suspicious air lucencies. Suspect layering bilateral effusions, right greater than left. Otherwise stable mediastinal contours. No acute osseous or soft tissue abnormality. IMPRESSION: 1. New linear lucency in the medial right lung base is indeterminate, possibly medial pneumothorax versus mediastinal gas. 2. Stable bilateral airspace  disease. 3. Lines and tubes as above. Electronically Signed   By: Lovena Le M.D.   On: 02/25/2019 06:00   DG CHEST PORT 1 VIEW  Result Date: 02/23/2019 CLINICAL DATA:  Endotracheal tube. Respiratory distress. COVID pneumonia EXAM: PORTABLE CHEST 1 VIEW COMPARISON:  02/23/2019 at 533 hours FINDINGS: endotracheal tube repositioned to 2.5 cm from carina. NG tube extends the stomach. RIGHT PICC line unchanged. Bilateral chest tubes in place. No pneumothorax. Dense bibasilar airspace disease. IMPRESSION: 1. Endotracheal tube in good position. 2. Bilateral chest tubes without pneumothorax. 3. Dense bibasilar airspace disease unchanged. Electronically Signed   By: Suzy Bouchard M.D.   On: 02/23/2019 10:53

## 2019-02-25 NOTE — Progress Notes (Signed)
Palliative:  HPI: 54 y.o.malewith past medical history of none knownadmitted on 1/23/2021with shortness of breath with COVID ARDS, acute kidney injury, mild transaminitis.Required intubation 1/24 and hospitalization complicated by multiple strokes, bilateral pneumothorax requiring chest tube placement, and aortic clot. He has been unable to wean to extubate with poor neurological status.  Andrew Bruce is off COVID precautions and wife is at bedside. She is happy to be with him and is trying to get a response out of him. Unfortunately I did not see that he had any purposeful response during my visit with her. They are still asking about timing of tracheostomy - awaiting PCCM to schedule and plan.   I called and spoke with daughter, Andrew Bruce, per request of wife, Andrew Bruce. Andrew Bruce is also wondering about timing of tracheostomy. They continue to desire full aggressive care. She had no further concerns.  All questions/concerns addressed. Emotional support provided.   Exam: Sedated on vent. Does not respond or follow any commands. Tolerating vent with no dyssynchrony. Abd flat, soft. Bilateral toes with embolic changes.   Plan: - Continue with full aggressive care.  - Please call [phone] for acute palliative needs over the weekend otherwise I will f/u Monday.   15 min  Andrew Channel, NP Palliative Medicine Team Pager [phone] (Please see [email or web address] for schedule) Team Phone [phone]    Greater than 50%  of this time was spent counseling and coordinating care related to the above assessment and plan

## 2019-02-26 ENCOUNTER — Inpatient Hospital Stay (HOSPITAL_COMMUNITY): Payer: HRSA Program

## 2019-02-26 LAB — COMPREHENSIVE METABOLIC PANEL
ALT: 27 U/L (ref 0–44)
AST: 26 U/L (ref 15–41)
Albumin: 1.3 g/dL — ABNORMAL LOW (ref 3.5–5.0)
Alkaline Phosphatase: 110 U/L (ref 38–126)
Anion gap: 7 (ref 5–15)
BUN: 28 mg/dL — ABNORMAL HIGH (ref 6–20)
CO2: 31 mmol/L (ref 22–32)
Calcium: 7.6 mg/dL — ABNORMAL LOW (ref 8.9–10.3)
Chloride: 102 mmol/L (ref 98–111)
Creatinine, Ser: 0.38 mg/dL — ABNORMAL LOW (ref 0.61–1.24)
GFR calc Af Amer: >60 mL/min (ref >60)
GFR calc non Af Amer: >60 mL/min (ref >60)
Glucose, Bld: 159 mg/dL — ABNORMAL HIGH (ref 70–99)
Potassium: 3.2 mmol/L — ABNORMAL LOW (ref 3.5–5.1)
Sodium: 140 mmol/L (ref 135–145)
Total Bilirubin: 0.5 mg/dL (ref 0.3–1.2)
Total Protein: 5.4 g/dL — ABNORMAL LOW (ref 6.5–8.1)

## 2019-02-26 LAB — CBC WITH DIFFERENTIAL/PLATELET
Abs Immature Granulocytes: 1.05 10*3/uL — ABNORMAL HIGH (ref 0.00–0.07)
Basophils Absolute: 0.1 10*3/uL (ref 0.0–0.1)
Basophils Relative: 1 %
Eosinophils Absolute: 0.4 10*3/uL (ref 0.0–0.5)
Eosinophils Relative: 3 %
HCT: 25.9 % — ABNORMAL LOW (ref 39.0–52.0)
Hemoglobin: 8.1 g/dL — ABNORMAL LOW (ref 13.0–17.0)
Immature Granulocytes: 9 %
Lymphocytes Relative: 11 %
Lymphs Abs: 1.3 10*3/uL (ref 0.7–4.0)
MCH: 29.1 pg (ref 26.0–34.0)
MCHC: 31.3 g/dL (ref 30.0–36.0)
MCV: 93.2 fL (ref 80.0–100.0)
Monocytes Absolute: 0.5 10*3/uL (ref 0.1–1.0)
Monocytes Relative: 4 %
Neutro Abs: 8.9 10*3/uL — ABNORMAL HIGH (ref 1.7–7.7)
Neutrophils Relative %: 72 %
Platelets: 591 10*3/uL — ABNORMAL HIGH (ref 150–400)
RBC: 2.78 MIL/uL — ABNORMAL LOW (ref 4.22–5.81)
RDW: 14.4 % (ref 11.5–15.5)
WBC: 12.2 10*3/uL — ABNORMAL HIGH (ref 4.0–10.5)
nRBC: 0.4 % — ABNORMAL HIGH (ref 0.0–0.2)

## 2019-02-26 LAB — GLUCOSE, CAPILLARY
Glucose-Capillary: 102 mg/dL — ABNORMAL HIGH (ref 70–99)
Glucose-Capillary: 107 mg/dL — ABNORMAL HIGH (ref 70–99)
Glucose-Capillary: 157 mg/dL — ABNORMAL HIGH (ref 70–99)
Glucose-Capillary: 85 mg/dL (ref 70–99)
Glucose-Capillary: 93 mg/dL (ref 70–99)
Glucose-Capillary: 95 mg/dL (ref 70–99)

## 2019-02-26 LAB — PHOSPHORUS: Phosphorus: 3.5 mg/dL (ref 2.5–4.6)

## 2019-02-26 LAB — TRIGLYCERIDES: Triglycerides: 981 mg/dL — ABNORMAL HIGH (ref <150)

## 2019-02-26 LAB — PROTIME-INR
INR: 1.1 (ref 0.8–1.2)
Prothrombin Time: 14 seconds (ref 11.4–15.2)

## 2019-02-26 LAB — MAGNESIUM: Magnesium: 2.5 mg/dL — ABNORMAL HIGH (ref 1.7–2.4)

## 2019-02-26 MED ORDER — POTASSIUM CHLORIDE 20 MEQ/15ML (10%) PO SOLN
30.0000 meq | ORAL | Status: AC
  Administered 2019-02-26 (×2): 30 meq
  Filled 2019-02-26 (×2): qty 30

## 2019-02-26 MED ORDER — ADULT MULTIVITAMIN LIQUID CH
15.0000 mL | Freq: Every day | ORAL | Status: DC
  Administered 2019-02-27 – 2019-03-23 (×22): 15 mL
  Filled 2019-02-26 (×22): qty 15

## 2019-02-26 MED ORDER — OXYCODONE HCL 5 MG PO TABS
10.0000 mg | ORAL_TABLET | ORAL | Status: DC
  Administered 2019-02-26 – 2019-03-05 (×35): 10 mg
  Filled 2019-02-26 (×35): qty 2

## 2019-02-26 MED ORDER — CLONAZEPAM 1 MG PO TABS
2.0000 mg | ORAL_TABLET | Freq: Three times a day (TID) | ORAL | Status: DC
  Administered 2019-02-26 – 2019-03-03 (×15): 2 mg
  Filled 2019-02-26 (×15): qty 2

## 2019-02-26 NOTE — Progress Notes (Signed)
[facility] ADULT ICU REPLACEMENT PROTOCOL FOR AM LAB REPLACEMENT ONLY  The patient does not apply for the [facility] Adult ICU Electrolyte Replacment Protocol based on the criteria listed below:   1. Is GFR >/= 40 ml/min? Yes.    Patient's GFR today is >60 2. Is urine output >/= 0.5 ml/kg/hr for the last 6 hours? Yes.   Patient's UOP is 0.94 ml/kg/hr 3. Is BUN < 60 mg/dL? Yes.    Patient's BUN today is 28 4. Abnormal electrolyte K 3.2 5. Ordered repletion with:protocol 6. If a panic level lab has been reported, has the CCM MD in charge been notified? Yes.  .   Physician:  Tonny Branch, Lang Snow 02/26/2019 6:51 AM

## 2019-02-26 NOTE — Progress Notes (Addendum)
Patient had a 6 beat run of VTach. Notified MD.   Cranford Mon

## 2019-02-26 NOTE — Progress Notes (Signed)
NAME:  Andrew Bruce, MRN:  [id number], DOB:  February 09, 1965, LOS: 32 ADMISSION DATE:  02/06/2019, CONSULTATION DATE:  02/06/19 REFERRING MD:  ER, CHIEF COMPLAINT:  SOB   Brief History   [name] without a past medical history admitted 1/23 with 48 hours of worsening shortness of breath in the context of a 10-day Covid illness (dry cough, lost taste/smell).  Chest x-ray and vitals are consistent with a worsening COVID ARDS.  Admitted for observation.  Developed worsening shortness of breath and increasing O2 needs. Decompensated requiring intubation 1/24.  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   Past Medical History  None known prior to admit   Significant Hospital Events   1/23 Admit  1/24 Decompensated, tx to ICU, intubated 1/25 b/l chest tubes placed 1/25 CVC  2/3 - flomax started  2/6 - rt chest tube connection changed  2/9 - Still no BM. Afebrile.  Left chest tube connection changed  2/10 -  Palliative crae meeting  -> full code, full aggressive care including trach., lact. 40% fio2, 10 peep.  Currently on propofol and fentanyl infusion.  Not on pressors.  On tube feeds.  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 afbrile snce 2/7. TGL improved to 161   Consults:  Neurology  Palliative care  Procedures:  ETT 1/24 >> Bilateral chest tube placement 1/25 >> R Femoral TLC 1/25 >>  RUE PICC  Significant Diagnostic Tests:  CT Head 2/2 >> no acute intracranial hemorrhage, large area of infarct in the left occipital lobe, likely subacute  CTA Chest 2/3 >> LLL segmental PE, no CT evidence of right heart strain, multifocal PNA, background emphysema and probable degree of interstitial fibrosis  ECHO 2/3 >> LVEF 55-60%, no WMA, hypokinetic right ventricular free wall, McConnells sign present.  RV findings consistent with acute cor pulmonale, small pericardial effusion, mildly elevated pulmonary  artery systolic pressure  Micro Data:  COVID 1/12 >> positive  PCT 1/23 >> Neg 2/5 blood cx >> negative 2/6 sputum cx>> abundant diphtheroids Corynebacterium species   Antimicrobials:  Remdesivir 02/06/19>>1/28 Dexamethasone 02/06/19>>2/2  xxxxxxxxxxxxxx vanc 2/5>> (2/12 - diphtheroids_ Zosyn 2/5>> 2/9, Cefepime 2/9 >>2/11)   Interim history/subjective:    02/26/2019 - on minimal vent settings. Heavily sedated. Will attempt to schedule po meds to wean continuous IV in order to assist with awakening trials. Will need trach next week if unable to wean sedation day 19 of vent.   2/11: afebrile since February 21, 2019.  Has third spacing with bilateral chest tubes.  Is on propofol infusion along with fentanyl infusion and scheduled oxycodone.  RASS sedation score -3.  Without it during wake up assessment patient gets extremely tachypneic according to the nurse.  Making good urine.  Not on pressors.  CK 107 and lactic acid normal.  RN feels that he can be transferred to regular medical ICU because he is greater than 21 days since positive Covid test  Objective   Blood pressure 95/66, pulse 89, temperature (!) 96.6 F (35.9 C), temperature source Axillary, resp. rate (!) 24, height 5\' 6"  (1.676 m), weight 84.9 kg, SpO2 100 %.    Vent Mode: PRVC FiO2 (%):  [40 %] 40 % Set Rate:  [30 bmp] 30 bmp Vt Set:  [380 mL] 380 mL PEEP:  [5 cmH20-8 cmH20] 5 cmH20 Plateau Pressure:  [20 cmH20-22 cmH20] 20 cmH20   Intake/Output Summary (Last 24 hours) at 02/26/2019 1215 Last data filed at 02/26/2019  1200 Gross per 24 hour  Intake 3022.03 ml  Output 1550 ml  Net 1472.03 ml   Filed Weights   02/21/19 0404 02/25/19 0500 02/26/19 0347  Weight: 78.8 kg 83.1 kg 84.9 kg    General Appearance:  Looks criticall ill, deconditoned,  unresponsive sedated on vent.  Head:  Normocephalic, without obvious abnormality, atraumatic Eyes:  PERRL - yes, conjunctiva/corneas - clear     Ears:  Normal external ear  canals, both ears Nose:  G tube - no Throat:  ETT TUBE - yes , OG tube - yes Neck:  Supple,  No enlargement/tenderness/nodules Lungs: Clear to auscultation bilaterally, Ventilator   Synchrony - yes, 40% fio2, peep peep 8,  Heart:  S1 and S2 normal, no murmur, CVP - no.  Pressors - no Abdomen:  Soft, no masses, no organomegaly Genitalia / Rectal:  Not done Extremities:  Extremities- intact with edema with embolic toes Skin:  ntact in exposed areas . Sacral area - not examined Neurologic:  Sedation - diprivan and fent gtt -> RASS - -3      Resolved Hospital Problem list   Oliguria, hypernatremia Completed COVID Tx Assessment & Plan:   Acute hypoxemic respiratory failure due to COVID-19 pneumonia, ARDS, PE.  Acute left lower lobe pneumonia.  Plan -Continue PRVC  -  low tidal volume ventilation, 4 to 80 cc/kg ideal body weight with goal plateau less than 30 and driving pressure 53-GU is currently at goal  -Continue titrating PEEP and FiO2 per ARDS ladder.  -Sedation as required to maintain ventilator stability, have increased po agents to help with continuous. Perhaps once trach placed would have better success -completed abx today -VAP Prevention protocol   -Will require tracheostomy  -previous physician (Dr Marchelle Gearing) informed Dr Tonia Brooms who is back Monday 03/01/2019   Bilateral pneumothoraces Plan -Con't chest tube to suction while intubated -Get CT chest without contrast 02/25/2019.  Pulmonary embolism- segmental on CTA chest 2/3 ordered as part of abnormal echo. Provoked by covid.  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 Evidence of toe ischemia and microthrombi. - Noted on CTA chest 2/3. High risk for embolizing but intervention is more risky at this time. Plan -Continue full dose anticoagulation - monitor gangrene on toes, keep area clean and dry  Acute right heart failure related to ARDS and PE -con't supportive care  Left Occipital and R  frontal CVA- Suspected embolic in nature in setting of COVID.     -Con't aspirin  - continue full dose AC - lopvenox 75mg  bid -working on decreasing sedation as vent weaning allows - continue lipitor Prolonged qtc:  -on old ekg >500 -will recheck today as on seroquel   COVID -19 - s/p remedesviri and decadron Plan  - monitor -off precautions as >21days   Possible VAP - cornyebacterium 02/20/2019 Plan  - dc cefepime  - vanc to end today completed 7 days   Steroid-induced Hyperglycemia-now improved control -con't levemir  -Sliding scale insulin every 4 hours as needed -Goal BG 140-180 while admitted to ICU    Constipation -Continue current bowel regimen  Anemia critical illness Plan -transfuse <7  Hypokalemai:  -replace   Best practice:  Diet: TF Pain/Anxiety/Delirium protocol (if indicated): in place VAP protocol (if indicated): Ordered DVT prophylaxis: lovenox Rx dose of 75mg  bid with pharmacy monitoring GI prophylaxis: Pepcid Glucose control: SSI and Levemir Mobility: Bedrest GU - on foley.  Code Status: Full Code  Pall care 02/24/2019  Family Communication:  - wife Myrna Martinexz updated over phone     Disposition: ICU      ATTESTATION & SIGNATURE   Critical care time: The patient is critically ill with multiple organ systems failure and requires high complexity decision making for assessment and support, frequent evaluation and titration of therapies, application of advanced monitoring technologies and extensive interpretation of multiple databases.  Critical care time 42 mins. This represents my time independent of the NPs time taking care of the pt. This is excluding procedures.    Briant Sites DO  Pulmonary and Critical Care 02/26/2019, 12:25 PM    LABS    PULMONARY Recent Labs  Lab 02/20/19 0928  PHART 7.382  PCO2ART 61.8*  PO2ART 71.0*  HCO3 36.9*  TCO2 39*  O2SAT 93.0    CBC Recent Labs  Lab 02/24/19 0312  02/25/19 0500 02/26/19 0342  HGB 8.8* 8.7* 8.1*  HCT 30.1* 30.0* 25.9*  WBC 15.3* 14.4* 12.2*  PLT 611* 649* 591*    COAGULATION Recent Labs  Lab 02/26/19 0342  INR 1.1    CARDIAC  No results for input(s): TROPONINI in the last 168 hours. No results for input(s): PROBNP in the last 168 hours.   CHEMISTRY Recent Labs  Lab 02/22/19 0442 02/22/19 0442 02/23/19 0328 02/23/19 0328 02/24/19 0312 02/24/19 0312 02/25/19 0500 02/26/19 0342  NA 145  --  145  --  144  --  144 140  K 3.5   < > 3.0*   < > 3.4*   < > 4.0 3.2*  CL 101  --  98  --  101  --  102 102  CO2 34*  --  38*  --  35*  --  33* 31  GLUCOSE 103*  --  143*  --  156*  --  121* 159*  BUN 31*  --  30*  --  28*  --  32* 28*  CREATININE 0.47*  --  0.49*  --  0.41*  --  0.50* 0.38*  CALCIUM 8.4*  --  8.1*  --  8.1*  --  8.2* 7.6*  MG 2.2  --  2.3  --  2.3  --  2.4 2.5*  PHOS 3.0  --  3.8  --  3.4  --  5.0* 3.5   < > = values in this interval not displayed.   Estimated Creatinine Clearance: 110.3 mL/min (A) (by C-G formula based on SCr of 0.38 mg/dL (L)).   LIVER Recent Labs  Lab 02/25/19 0500 02/26/19 0342  AST 31 26  ALT 31 27  ALKPHOS 119 110  BILITOT 0.4 0.5  PROT 5.9* 5.4*  ALBUMIN 1.4* 1.3*  INR  --  1.1     INFECTIOUS Recent Labs  Lab 02/25/19 0000  LATICACIDVEN 0.9     ENDOCRINE CBG (last 3)  Recent Labs    02/26/19 0322 02/26/19 0747 02/26/19 1116  GLUCAP 157* 102* 85         IMAGING x48h  - image(s) personally visualized  -   highlighted in bold CT CHEST WO CONTRAST  Result Date: 02/25/2019 CLINICAL DATA:  Pneumothorax and ARDS EXAM: CT CHEST WITHOUT CONTRAST TECHNIQUE: Multidetector CT imaging of the chest was performed following the standard protocol without IV contrast. COMPARISON:  02/17/2019 chest CT FINDINGS: Cardiovascular: Scattered calcified atherosclerotic changes in the thoracic aorta. No signs of aneurysmal dilation. Heart size is stable with small pericardial  fluid. Limited assessment of vascular structures in  the chest secondary to lack of intravenous contrast. Known aortic thrombus not well seen select not well seen not seen central pulmonary vasculature is mildly engorged 3.1 cm Mediastinum/Nodes: Endotracheal tube terminates in the trachea approximately 1.2 cm above the carina. Scattered lymph nodes throughout the mediastinum, nonspecific in the setting of acute illness with similar degree of enlargement when compared to the prior exam. Right sided PICC line terminates at the caval to atrial junction as before. Lungs/Pleura: Patchy peripheral ground-glass and septal thickening at the lung apices appears similar perhaps slightly worse compared to the study of 02/17/2019. Marked interval worsening of bibasilar consolidative changes in the lower lobes with near complete collapse of bilateral lower lobes right worse than left. Tiny basilar pneumothorax on the right. Interval development of cystic change along the minor fissure in the right middle lobe (image 103, series 8) there was some consolidation in this area on the previous study. No fluid is noted in this area. Airways are grossly patent and extensive air bronchograms are noted in the lung bases bilaterally. Bilateral chest tubes remain in place. Upper Abdomen: Incidental imaging of upper abdominal contents shows no acute finding. Musculoskeletal: No signs of acute bone finding or destructive bone process. IMPRESSION: 1. Marked interval worsening of bibasilar consolidative changes with near complete collapse of bilateral lower lobes right worse than left. 2. Interval development of cystic change along the minor fissure in the right middle lobe. No fluid is noted in this area, this may represent developing post infectious pneumatocele. Attention on follow-up. 3. Bilateral chest tubes remain in place. Tiny right basilar pneumothorax. 4. 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 5. Endotracheal  tube approximately 1-1.5 cm above the carina. These results will be called to the ordering clinician or representative by the Radiologist Assistant, and communication documented in the PACS or zVision Dashboard. Aortic Atherosclerosis (ICD10-I70.0). Electronically Signed   By: Donzetta Kohut M.D.   On: 02/25/2019 14:55   DG CHEST PORT 1 VIEW  Result Date: 02/26/2019 CLINICAL DATA:  Intubation, recent COVID EXAM: PORTABLE CHEST 1 VIEW COMPARISON:  CT chest 02/25/2019, radiograph 02/25/2019 FINDINGS: *Endotracheal tube terminates in the lower trachea, 4.1 cm from the carina. *Bilateral chest tubes remain in place. *A transesophageal tube tip and side port distal to the GE junction. *Right upper extremity PICC tip terminates at the right atrium. *Telemetry leads overlie the chest. Increasing density of the bilateral interstitial and airspace opacity most pronounced in the lung bases may be related to diminished lung volume/worsening atelectasis. No visible residual medial right pneumothorax. Suspect trace effusions. Cardiomediastinal contours are stable from prior. IMPRESSION: 1. Increasing density of bilateral interstitial and airspace opacities most pronounced in the lung bases some of which may be related to diminished lung volume/worsening atelectasis. 2. No residual visible right pneumothorax. 3. Tubes and lines as above. Electronically Signed   By: Kreg Shropshire M.D.   On: 02/26/2019 06:35   DG CHEST PORT 1 VIEW  Result Date: 02/25/2019 CLINICAL DATA:  Intubation EXAM: PORTABLE CHEST 1 VIEW COMPARISON:  Radiograph 02/23/2019, CT 02/17/2019 FINDINGS: *Endotracheal tube terminates 4 cm from the carina. *Transesophageal tube tip and side port below the GE junction, beyond the level of imaging. *Bilateral chest tubes are present. *Right upper extremity PICC tip terminates at the superior cavoatrial junction Persistent mixed interstitial and patchy airspace opacities present in both lungs with slightly  diminished lung volumes. There is new linear medial lucency in the right lung base which is indeterminate, could  reflect right pleural gas along the posterior junction line versus air within the esophagus or other mediastinal gas. No other suspicious air lucencies. Suspect layering bilateral effusions, right greater than left. Otherwise stable mediastinal contours. No acute osseous or soft tissue abnormality. IMPRESSION: 1. New linear lucency in the medial right lung base is indeterminate, possibly medial pneumothorax versus mediastinal gas. 2. Stable bilateral airspace disease. 3. Lines and tubes as above. Electronically Signed   By: Kreg Shropshire M.D.   On: 02/25/2019 06:00

## 2019-02-27 LAB — CBC WITH DIFFERENTIAL/PLATELET
Abs Immature Granulocytes: 0.88 10*3/uL — ABNORMAL HIGH (ref 0.00–0.07)
Basophils Absolute: 0.1 10*3/uL (ref 0.0–0.1)
Basophils Relative: 1 %
Eosinophils Absolute: 0.4 10*3/uL (ref 0.0–0.5)
Eosinophils Relative: 3 %
HCT: 26.6 % — ABNORMAL LOW (ref 39.0–52.0)
Hemoglobin: 8 g/dL — ABNORMAL LOW (ref 13.0–17.0)
Immature Granulocytes: 7 %
Lymphocytes Relative: 11 %
Lymphs Abs: 1.4 10*3/uL (ref 0.7–4.0)
MCH: 27.8 pg (ref 26.0–34.0)
MCHC: 30.1 g/dL (ref 30.0–36.0)
MCV: 92.4 fL (ref 80.0–100.0)
Monocytes Absolute: 0.6 10*3/uL (ref 0.1–1.0)
Monocytes Relative: 5 %
Neutro Abs: 9.9 10*3/uL — ABNORMAL HIGH (ref 1.7–7.7)
Neutrophils Relative %: 73 %
Platelets: 629 10*3/uL — ABNORMAL HIGH (ref 150–400)
RBC: 2.88 MIL/uL — ABNORMAL LOW (ref 4.22–5.81)
RDW: 14.5 % (ref 11.5–15.5)
WBC: 13.2 10*3/uL — ABNORMAL HIGH (ref 4.0–10.5)
nRBC: 0.3 % — ABNORMAL HIGH (ref 0.0–0.2)

## 2019-02-27 LAB — BASIC METABOLIC PANEL
Anion gap: 6 (ref 5–15)
BUN: 27 mg/dL — ABNORMAL HIGH (ref 6–20)
CO2: 31 mmol/L (ref 22–32)
Calcium: 7.9 mg/dL — ABNORMAL LOW (ref 8.9–10.3)
Chloride: 104 mmol/L (ref 98–111)
Creatinine, Ser: 0.34 mg/dL — ABNORMAL LOW (ref 0.61–1.24)
GFR calc Af Amer: >60 mL/min (ref >60)
GFR calc non Af Amer: >60 mL/min (ref >60)
Glucose, Bld: 127 mg/dL — ABNORMAL HIGH (ref 70–99)
Potassium: 4 mmol/L (ref 3.5–5.1)
Sodium: 141 mmol/L (ref 135–145)

## 2019-02-27 LAB — GLUCOSE, CAPILLARY
Glucose-Capillary: 121 mg/dL — ABNORMAL HIGH (ref 70–99)
Glucose-Capillary: 138 mg/dL — ABNORMAL HIGH (ref 70–99)
Glucose-Capillary: 108 mg/dL — ABNORMAL HIGH (ref 70–99)
Glucose-Capillary: 109 mg/dL — ABNORMAL HIGH (ref 70–99)
Glucose-Capillary: 116 mg/dL — ABNORMAL HIGH (ref 70–99)
Glucose-Capillary: 195 mg/dL — ABNORMAL HIGH (ref 70–99)

## 2019-02-27 LAB — MAGNESIUM: Magnesium: 2.4 mg/dL (ref 1.7–2.4)

## 2019-02-27 LAB — PHOSPHORUS: Phosphorus: 3.6 mg/dL (ref 2.5–4.6)

## 2019-02-27 NOTE — Progress Notes (Addendum)
NAME:  Andrew Bruce, MRN:  [id number], DOB:  1965/11/18, LOS: 21 ADMISSION DATE:  02/06/2019, CONSULTATION DATE:  02/06/19 REFERRING MD:  ER, CHIEF COMPLAINT:  SOB   Brief History   [name] without a past medical history admitted 1/23 with 48 hours of worsening shortness of breath in the context of a 10-day Covid illness (dry cough, lost taste/smell).  Chest x-ray and vitals are consistent with a worsening COVID ARDS.  Admitted for observation.  Developed worsening shortness of breath and increasing O2 needs. Decompensated requiring intubation 1/24.  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   Past Medical History  None known prior to admit   Significant Hospital Events   1/23 Admit  1/24 Decompensated, tx to ICU, intubated 1/25 b/l chest tubes placed 1/25 CVC  2/3 - flomax started  2/6 - rt chest tube connection changed  2/9 - Still no BM. Afebrile.  Left chest tube connection changed  2/10 -  Palliative crae meeting  -> full code, full aggressive care including trach., lact. 40% fio2, 10 peep.  Currently on propofol and fentanyl infusion.  Not on pressors.  On tube feeds.  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 afbrile snce 2/7. TGL improved to 161   Consults:  Neurology  Palliative care  Procedures:  ETT 1/24 >> Bilateral chest tube placement 1/25 >> R Femoral TLC 1/25 >>  RUE PICC  Significant Diagnostic Tests:  CT Head 2/2 >> no acute intracranial hemorrhage, large area of infarct in the left occipital lobe, likely subacute  CTA Chest 2/3 >> LLL segmental PE, no CT evidence of right heart strain, multifocal PNA, background emphysema and probable degree of interstitial fibrosis  ECHO 2/3 >> LVEF 55-60%, no WMA, hypokinetic right ventricular free wall, McConnells sign present.  RV findings consistent with acute cor pulmonale, small pericardial effusion, mildly elevated pulmonary  artery systolic pressure  Micro Data:  COVID 1/12 >> positive  PCT 1/23 >> Neg 2/5 blood cx >> negative 2/6 sputum cx>> abundant diphtheroids Corynebacterium species   Antimicrobials:  Remdesivir 02/06/19>>1/28 Dexamethasone 02/06/19>>2/2  xxxxxxxxxxxxxx vanc 2/5>> (2/12 - diphtheroids_ Zosyn 2/5>> 2/9, Cefepime 2/9 >>2/11)   Interim history/subjective:  No significant interval change reported Was tachypneic initially on PSV, tolerated a higher pressure support Weaning sedation I/O+ 13 L total  Objective   Blood pressure 130/74, pulse (!) 115, temperature 100.2 F (37.9 C), resp. rate 12, height 5\' 6"  (1.676 m), weight 85.1 kg, SpO2 95 %.    Vent Mode: CPAP;PSV FiO2 (%):  [40 %] 40 % Set Rate:  [30 bmp] 30 bmp Vt Set:  [380 mL] 380 mL PEEP:  [5 cmH20] 5 cmH20 Pressure Support:  [25 cmH20] 25 cmH20 Plateau Pressure:  [20 cmH20-22 cmH20] 22 cmH20   Intake/Output Summary (Last 24 hours) at 02/27/2019 1115 Last data filed at 02/27/2019 1100 Gross per 24 hour  Intake 2261.87 ml  Output 2000 ml  Net 261.87 ml   Filed Weights   02/25/19 0500 02/26/19 0347 02/27/19 0415  Weight: 83.1 kg 84.9 kg 85.1 kg    General Appearance: Ill-appearing man, mild respiratory distress, Head:  Normocephalic, without obvious abnormality, atraumatic H EENT: ET tube in place, no significant secretions, no oral secretions Neck: No stridor Lungs: Coarse bilaterally, no wheezing Heart: Regular, no murmur Abdomen: Nondistended, hypoactive bowel sounds Extremities: Lower extremity edema, evidence for toe ischemia Skin: No rash Neurologic: RASS -3 to -4    [facility]  Problem list   Oliguria, hypernatremia Completed COVID Tx Assessment & Plan:   Acute hypoxemic respiratory failure due to COVID-19 pneumonia, ARDS, PE.  Acute left lower lobe pneumonia, treated. Plan Continue PRVC with low tidal volume ventilation.  Question whether we can start to liberalize as his COVID-19 was over 3  weeks ago Attempt to lighten sedation, work towards spontaneous breathing trials.  Agitation and mental status appear to be significant barriers at this time Suspect that he will require tracheostomy in order to progress.  Dr. Marchelle Gearing has discussed with Dr. Tonia Brooms who will evaluate next week  Bilateral pneumothoraces Plan Continue bilateral chest tubes to suction Follow chest x-ray  Pulmonary embolism- segmental on CTA chest 2/3 ordered as part of abnormal echo. Provoked by covid.  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 Evidence of toe ischemia and microthrombi. - Noted on CTA chest 2/3. High risk for embolizing but intervention is more risky at this time. Plan Therapeutic enoxaparin Follow areas of skin injury, gangrene on toes.  Local care  Acute right heart failure related to ARDS and PE Diurese as able  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     Continue aspirin, full dose anticoagulation Attempting to wean sedation.  Unsure whether the occipital CVA will impact his airway protection and secretion management Continue Lipitor  Prolonged qtc:  Follow QT-c  COVID -19 - s/p remedesviri and decadron Plan Off respiratory isolation, diagnosis was greater than 21 days ago   Possible VAP - cornyebacterium 02/20/2019 Plan Treated, follow clinically   Steroid-induced Hyperglycemia-now improved control Levemir Sliding-scale insulin as per protocol   Constipation Bowel regimen  Anemia critical illness Plan Conservative transfusion, goal Hgb > 7     Best practice:  Diet: TF Pain/Anxiety/Delirium protocol (if indicated): in place VAP protocol (if indicated): Ordered DVT prophylaxis: lovenox Rx dose of 75mg  bid with pharmacy monitoring GI prophylaxis: Pepcid Glucose control: SSI and Levemir Mobility: Bedrest GU - on foley.  Code Status: Full Code   Pall care 02/24/2019 Family Communication:  -  wife Andrew Bruce at bedside on 2/13 with Spanish interpreter Disposition: ICU    ATTESTATION & SIGNATURE   Critical care time: The patient is critically ill with multiple organ systems failure and requires high complexity decision making for assessment and support, frequent evaluation and titration of therapies, application of advanced monitoring technologies and extensive interpretation of multiple databases.  Critical care time 32 mins. This represents my time independent of the NPs time taking care of the pt. This is excluding procedures.     3/13, MD, PhD 02/27/2019, 11:37 AM [facility] [phone] or if no answer [phone]   LABS    PULMONARY No results for input(s): PHART, PCO2ART, PO2ART, HCO3, TCO2, O2SAT in the last 168 hours.  Invalid input(s): PCO2, PO2  CBC Recent Labs  Lab 02/25/19 0500 02/26/19 0342 02/27/19 0348  HGB 8.7* 8.1* 8.0*  HCT 30.0* 25.9* 26.6*  WBC 14.4* 12.2* 13.2*  PLT 649* 591* 629*    COAGULATION Recent Labs  Lab 02/26/19 0342  INR 1.1    CARDIAC  No results for input(s): TROPONINI in the last 168 hours. No results for input(s): PROBNP in the last 168 hours.   CHEMISTRY Recent Labs  Lab 02/23/19 0328 02/23/19 0328 02/24/19 0312 02/24/19 0312 02/25/19 0500 02/25/19 0500 02/26/19 0342 02/27/19 0348  NA 145  --  144  --  144  --  140 141  K 3.0*   < > 3.4*   < > 4.0   < > 3.2* 4.0  CL 98  --  101  --  102  --  102 104  CO2 38*  --  35*  --  33*  --  31 31  GLUCOSE 143*  --  156*  --  121*  --  159* 127*  BUN 30*  --  28*  --  32*  --  28* 27*  CREATININE 0.49*  --  0.41*  --  0.50*  --  0.38* 0.34*  CALCIUM 8.1*  --  8.1*  --  8.2*  --  7.6* 7.9*  MG 2.3  --  2.3  --  2.4  --  2.5* 2.4  PHOS 3.8  --  3.4  --  5.0*  --  3.5 3.6   < > = values in this interval not displayed.   Estimated Creatinine Clearance: 110.5 mL/min (A) (by C-G formula based on SCr of 0.34 mg/dL  (L)).   LIVER Recent Labs  Lab 02/25/19 0500 02/26/19 0342  AST 31 26  ALT 31 27  ALKPHOS 119 110  BILITOT 0.4 0.5  PROT 5.9* 5.4*  ALBUMIN 1.4* 1.3*  INR  --  1.1     INFECTIOUS Recent Labs  Lab 02/25/19 0000  LATICACIDVEN 0.9     ENDOCRINE CBG (last 3)  Recent Labs    02/26/19 2354 02/27/19 0353 02/27/19 0811  GLUCAP 107* 121* 138*

## 2019-02-28 LAB — CBC WITH DIFFERENTIAL/PLATELET
Abs Immature Granulocytes: 1.03 10*3/uL — ABNORMAL HIGH (ref 0.00–0.07)
Basophils Absolute: 0.1 10*3/uL (ref 0.0–0.1)
Basophils Relative: 1 %
Eosinophils Absolute: 0.4 10*3/uL (ref 0.0–0.5)
Eosinophils Relative: 3 %
HCT: 30.4 % — ABNORMAL LOW (ref 39.0–52.0)
Hemoglobin: 9 g/dL — ABNORMAL LOW (ref 13.0–17.0)
Immature Granulocytes: 7 %
Lymphocytes Relative: 11 %
Lymphs Abs: 1.7 10*3/uL (ref 0.7–4.0)
MCH: 27.4 pg (ref 26.0–34.0)
MCHC: 29.6 g/dL — ABNORMAL LOW (ref 30.0–36.0)
MCV: 92.7 fL (ref 80.0–100.0)
Monocytes Absolute: 0.8 10*3/uL (ref 0.1–1.0)
Monocytes Relative: 5 %
Neutro Abs: 11.1 10*3/uL — ABNORMAL HIGH (ref 1.7–7.7)
Neutrophils Relative %: 73 %
Platelets: 726 10*3/uL — ABNORMAL HIGH (ref 150–400)
RBC: 3.28 MIL/uL — ABNORMAL LOW (ref 4.22–5.81)
RDW: 14.5 % (ref 11.5–15.5)
WBC: 15.1 10*3/uL — ABNORMAL HIGH (ref 4.0–10.5)
nRBC: 0.2 % (ref 0.0–0.2)

## 2019-02-28 LAB — PHOSPHORUS: Phosphorus: 3.6 mg/dL (ref 2.5–4.6)

## 2019-02-28 LAB — GLUCOSE, CAPILLARY
Glucose-Capillary: 184 mg/dL — ABNORMAL HIGH (ref 70–99)
Glucose-Capillary: 169 mg/dL — ABNORMAL HIGH (ref 70–99)
Glucose-Capillary: 149 mg/dL — ABNORMAL HIGH (ref 70–99)
Glucose-Capillary: 141 mg/dL — ABNORMAL HIGH (ref 70–99)
Glucose-Capillary: 94 mg/dL (ref 70–99)

## 2019-02-28 LAB — MAGNESIUM: Magnesium: 2.4 mg/dL (ref 1.7–2.4)

## 2019-02-28 MED ORDER — ATORVASTATIN CALCIUM 10 MG PO TABS
20.0000 mg | ORAL_TABLET | Freq: Every day | ORAL | Status: DC
  Administered 2019-02-28 – 2019-03-23 (×21): 20 mg
  Filled 2019-02-28 (×2): qty 1
  Filled 2019-02-28 (×2): qty 2
  Filled 2019-02-28 (×2): qty 1
  Filled 2019-02-28 (×2): qty 2
  Filled 2019-02-28: qty 1
  Filled 2019-02-28 (×3): qty 2
  Filled 2019-02-28 (×2): qty 1
  Filled 2019-02-28: qty 2
  Filled 2019-02-28: qty 1
  Filled 2019-02-28 (×3): qty 2
  Filled 2019-02-28: qty 1
  Filled 2019-02-28: qty 2

## 2019-02-28 MED ORDER — FUROSEMIDE 10 MG/ML IJ SOLN
40.0000 mg | Freq: Once | INTRAMUSCULAR | Status: AC
  Administered 2019-02-28: 12:00:00 40 mg via INTRAVENOUS
  Filled 2019-02-28: qty 4

## 2019-02-28 NOTE — Progress Notes (Signed)
NAME:  Andrew Bruce, MRN:  [id number], DOB:  Feb 14, 1965, LOS: 10 ADMISSION DATE:  02/06/2019, CONSULTATION DATE:  02/06/19 REFERRING MD:  ER, CHIEF COMPLAINT:  SOB   Brief History   [name] without a past medical history admitted 1/23 with 48 hours of worsening shortness of breath in the context of a 10-day Covid illness (dry cough, lost taste/smell).  Chest x-ray and vitals are consistent with a worsening COVID ARDS.  Admitted for observation.  Developed worsening shortness of breath and increasing O2 needs. Decompensated requiring intubation 1/24.  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   Past Medical History  None known prior to admit   Significant Hospital Events   1/23 Admit  1/24 Decompensated, tx to ICU, intubated 1/25 b/l chest tubes placed 1/25 CVC  2/3 - flomax started  2/6 - rt chest tube connection changed  2/9 - Still no BM. Afebrile.  Left chest tube connection changed  2/10 -  Palliative crae meeting  -> full code, full aggressive care including trach., lact. 40% fio2, 10 peep.  Currently on propofol and fentanyl infusion.  Not on pressors.  On tube feeds.  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 afbrile snce 2/7. TGL improved to 161   Consults:  Neurology  Palliative care  Procedures:  ETT 1/24 >> Bilateral chest tube placement 1/25 >> R Femoral TLC 1/25 >>  RUE PICC  Significant Diagnostic Tests:  CT Head 2/2 >> no acute intracranial hemorrhage, large area of infarct in the left occipital lobe, likely subacute  CTA Chest 2/3 >> LLL segmental PE, no CT evidence of right heart strain, multifocal PNA, background emphysema and probable degree of interstitial fibrosis  ECHO 2/3 >> LVEF 55-60%, no WMA, hypokinetic right ventricular free wall, McConnells sign present.  RV findings consistent with acute cor pulmonale, small pericardial effusion, mildly elevated pulmonary  artery systolic pressure  Micro Data:  COVID 1/12 >> positive  PCT 1/23 >> Neg 2/5 blood cx >> negative 2/6 sputum cx>> abundant diphtheroids Corynebacterium species   Antimicrobials:  Remdesivir 02/06/19>>1/28 Dexamethasone 02/06/19>>2/2  xxxxxxxxxxxxxx vanc 2/5>> (2/12 - diphtheroids_ Zosyn 2/5>> 2/9, Cefepime 2/9 >>2/11)   Interim history/subjective:  Sedation has been weaned some, mild tachycardia 110s Tolerating high pressure support I/O+ 12.1 L  Objective   Blood pressure (!) 145/84, pulse (!) 112, temperature 99.7 F (37.6 C), temperature source Axillary, resp. rate 15, height 5\' 6"  (1.676 m), weight 85.1 kg, SpO2 98 %.    Vent Mode: PSV;CPAP FiO2 (%):  [40 %] 40 % Set Rate:  [30 bmp] 30 bmp Vt Set:  [380 mL] 380 mL PEEP:  [5 cmH20] 5 cmH20 Pressure Support:  [15 cmH20-20 cmH20] 15 cmH20 Plateau Pressure:  [20 cmH20-22 cmH20] 22 cmH20   Intake/Output Summary (Last 24 hours) at 02/28/2019 1101 Last data filed at 02/28/2019 1000 Gross per 24 hour  Intake 1006.94 ml  Output 2070 ml  Net -1063.06 ml   Filed Weights   02/25/19 0500 02/26/19 0347 02/27/19 0415  Weight: 83.1 kg 84.9 kg 85.1 kg    General Appearance: Ill-appearing man, no respiratory distress Head:  Normocephalic, without obvious abnormality, atraumatic H EENT: ET tube in place, oropharynx moist, no significant secretions Lungs: Coarse bilateral breath sounds, no wheeze Heart: Regular, tachycardic 110s, no murmur Abdomen: Nondistended, hypoactive bowel sound Extremities: Trace lower extremity edema, evidence for toe ischemia Skin: No rash Neurologic: Grimace with stimulation, does not wake to voice, does  not follow commands    Resolved Hospital Problem list   Oliguria, hypernatremia Completed COVID Tx Assessment & Plan:   Acute hypoxemic respiratory failure due to COVID-19 pneumonia, ARDS, PE.  Acute left lower lobe pneumonia, treated. Plan Continue PRVC with low tidal lung ventilation.  May  be able to liberalize his tidal volumes as his COVID-19 was over 3 weeks ago, check chest x-ray 2/15 and consider doing so depending on infiltrates, plateau pressures Lighten sedation and continue to work toward spontaneous breathing trials.  He is tolerating some high pressure support.  Progress has been slow and he will likely still require tracheostomy.  Dr. Marchelle Gearing has discussed with Dr. Tonia Brooms who will evaluate next week  Bilateral pneumothoraces Plan Bilateral chest tubes both to suction Follow chest x-ray  Pulmonary embolism- segmental on CTA chest 2/3 ordered as part of abnormal echo. Provoked by covid.  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 Evidence of toe ischemia and microthrombi. - Noted on CTA chest 2/3. High risk for embolizing but intervention is more risky at this time. Plan Enoxaparin as above Follow areas of skin injury, gangrene on toes.  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     Full dose anticoagulation, aspirin Weaning sedation as able.  Unsure whether his occipital CVA with impact airway protection, secretion management Continue Lipitor  Prolonged qtc:  Following QT-c  COVID -19 - s/p remedesviri and decadron Plan Off respiratory isolation, COVID-19 diagnosis was greater than 21 days ago   Possible VAP - cornyebacterium 02/20/2019 Plan Treated Following clinically  Steroid-induced Hyperglycemia-now improved control Sliding-scale insulin as per protocol Levemir  Constipation Bowel regimen  Anemia critical illness Plan Conservative transfusion, goal Hgb > 7     Best practice:  Diet: TF Pain/Anxiety/Delirium protocol (if indicated): in place VAP protocol (if indicated): Ordered DVT prophylaxis: lovenox Rx dose of 75mg  bid with pharmacy monitoring GI  prophylaxis: Pepcid Glucose control: SSI and Levemir Mobility: Bedrest GU - on foley.  Code Status: Full Code   Pall care 02/24/2019 Family Communication:  - wife Aubry Tucholski at bedside on 2/13 with Spanish interpreter Disposition: ICU    ATTESTATION & SIGNATURE   Critical care time: The patient is critically ill with multiple organ systems failure and requires high complexity decision making for assessment and support, frequent evaluation and titration of therapies, application of advanced monitoring technologies and extensive interpretation of multiple databases.  Critical care time 32 mins. This represents my time independent of the NPs time taking care of the pt. This is excluding procedures.     3/13, MD, PhD 02/28/2019, 11:01 AM [facility] [phone] or if no answer [phone]   LABS    PULMONARY No results for input(s): PHART, PCO2ART, PO2ART, HCO3, TCO2, O2SAT in the last 168 hours.  Invalid input(s): PCO2, PO2  CBC Recent Labs  Lab 02/26/19 0342 02/27/19 0348 02/28/19 0437  HGB 8.1* 8.0* 9.0*  HCT 25.9* 26.6* 30.4*  WBC 12.2* 13.2* 15.1*  PLT 591* 629* 726*    COAGULATION Recent Labs  Lab 02/26/19 0342  INR 1.1    CARDIAC  No results for input(s): TROPONINI in the last 168 hours. No results for input(s): PROBNP in the last 168 hours.   CHEMISTRY Recent Labs  Lab 02/23/19 [phone]  02/23/19 0328 02/24/19 6948 02/24/19 0312 02/25/19 0500 02/25/19 0500 02/26/19 0342 02/27/19 0348 02/28/19 0437  NA 145  --  144  --  144  --  140 141  --   K 3.0*   < > 3.4*   < > 4.0   < > 3.2* 4.0  --   CL 98  --  101  --  102  --  102 104  --   CO2 38*  --  35*  --  33*  --  31 31  --   GLUCOSE 143*  --  156*  --  121*  --  159* 127*  --   BUN 30*  --  28*  --  32*  --  28* 27*  --   CREATININE 0.49*  --  0.41*  --  0.50*  --  0.38* 0.34*  --   CALCIUM 8.1*  --  8.1*  --  8.2*  --  7.6* 7.9*  --   MG 2.3   < > 2.3  --   2.4  --  2.5* 2.4 2.4  PHOS 3.8   < > 3.4  --  5.0*  --  3.5 3.6 3.6   < > = values in this interval not displayed.   Estimated Creatinine Clearance: 110.5 mL/min (A) (by C-G formula based on SCr of 0.34 mg/dL (L)).   LIVER Recent Labs  Lab 02/25/19 0500 02/26/19 0342  AST 31 26  ALT 31 27  ALKPHOS 119 110  BILITOT 0.4 0.5  PROT 5.9* 5.4*  ALBUMIN 1.4* 1.3*  INR  --  1.1     INFECTIOUS Recent Labs  Lab 02/25/19 0000  LATICACIDVEN 0.9     ENDOCRINE CBG (last 3)  Recent Labs    02/27/19 2322 02/28/19 0316 02/28/19 0758  GLUCAP 195* 184* 169*

## 2019-03-01 ENCOUNTER — Inpatient Hospital Stay (HOSPITAL_COMMUNITY): Payer: HRSA Program

## 2019-03-01 DIAGNOSIS — Z7189 Other specified counseling: Secondary | ICD-10-CM

## 2019-03-01 DIAGNOSIS — Z515 Encounter for palliative care: Secondary | ICD-10-CM

## 2019-03-01 LAB — GLUCOSE, CAPILLARY
Glucose-Capillary: 105 mg/dL — ABNORMAL HIGH (ref 70–99)
Glucose-Capillary: 111 mg/dL — ABNORMAL HIGH (ref 70–99)
Glucose-Capillary: 126 mg/dL — ABNORMAL HIGH (ref 70–99)
Glucose-Capillary: 129 mg/dL — ABNORMAL HIGH (ref 70–99)
Glucose-Capillary: 83 mg/dL (ref 70–99)
Glucose-Capillary: 94 mg/dL (ref 70–99)

## 2019-03-01 LAB — CBC
HCT: 28.4 % — ABNORMAL LOW (ref 39.0–52.0)
Hemoglobin: 8.6 g/dL — ABNORMAL LOW (ref 13.0–17.0)
MCH: 28.2 pg (ref 26.0–34.0)
MCHC: 30.3 g/dL (ref 30.0–36.0)
MCV: 93.1 fL (ref 80.0–100.0)
Platelets: 663 10*3/uL — ABNORMAL HIGH (ref 150–400)
RBC: 3.05 MIL/uL — ABNORMAL LOW (ref 4.22–5.81)
RDW: 14.6 % (ref 11.5–15.5)
WBC: 13 10*3/uL — ABNORMAL HIGH (ref 4.0–10.5)
nRBC: 0.2 % (ref 0.0–0.2)

## 2019-03-01 LAB — BASIC METABOLIC PANEL
Anion gap: 11 (ref 5–15)
BUN: 24 mg/dL — ABNORMAL HIGH (ref 6–20)
CO2: 36 mmol/L — ABNORMAL HIGH (ref 22–32)
Calcium: 8 mg/dL — ABNORMAL LOW (ref 8.9–10.3)
Chloride: 92 mmol/L — ABNORMAL LOW (ref 98–111)
Creatinine, Ser: 0.33 mg/dL — ABNORMAL LOW (ref 0.61–1.24)
GFR calc Af Amer: >60 mL/min (ref >60)
GFR calc non Af Amer: >60 mL/min (ref >60)
Glucose, Bld: 113 mg/dL — ABNORMAL HIGH (ref 70–99)
Potassium: 3.5 mmol/L (ref 3.5–5.1)
Sodium: 139 mmol/L (ref 135–145)

## 2019-03-01 LAB — MAGNESIUM: Magnesium: 2.4 mg/dL (ref 1.7–2.4)

## 2019-03-01 LAB — TRIGLYCERIDES: Triglycerides: 551 mg/dL — ABNORMAL HIGH (ref <150)

## 2019-03-01 MED ORDER — FENTANYL CITRATE (PF) 100 MCG/2ML IJ SOLN: 200.0000 ug | Freq: Once | INTRAMUSCULAR | Status: DC

## 2019-03-01 MED ORDER — TAMSULOSIN HCL 0.4 MG PO CAPS
0.4000 mg | ORAL_CAPSULE | Freq: Every day | ORAL | Status: DC
  Administered 2019-03-01 – 2019-03-05 (×5): 0.4 mg via ORAL
  Filled 2019-03-01 (×5): qty 1

## 2019-03-01 MED ORDER — MIDAZOLAM HCL 2 MG/2ML IJ SOLN
5.0000 mg | Freq: Once | INTRAMUSCULAR | Status: DC
  Filled 2019-03-01: qty 6

## 2019-03-01 MED ORDER — VECURONIUM BROMIDE 10 MG IV SOLR: 10.0000 mg | Freq: Once | INTRAVENOUS | Status: DC

## 2019-03-01 MED ORDER — ETOMIDATE 2 MG/ML IV SOLN: 40.0000 mg | Freq: Once | INTRAVENOUS | Status: DC

## 2019-03-01 MED ORDER — MIDAZOLAM HCL 2 MG/2ML IJ SOLN: 5.0000 mg | Freq: Once | INTRAMUSCULAR | Status: DC

## 2019-03-01 MED ORDER — PROPOFOL 10 MG/ML IV BOLUS
500.0000 mg | Freq: Once | INTRAVENOUS | Status: AC
  Administered 2019-03-02: 80 mg via INTRAVENOUS

## 2019-03-01 MED ORDER — VECURONIUM BROMIDE 10 MG IV SOLR
10.0000 mg | Freq: Once | INTRAVENOUS | Status: AC
  Administered 2019-03-02: 8 mg via INTRAVENOUS
  Filled 2019-03-01: qty 10

## 2019-03-01 MED ORDER — PROPOFOL 10 MG/ML IV BOLUS: 500.0000 mg | Freq: Once | INTRAVENOUS | Status: DC

## 2019-03-01 MED ORDER — ETOMIDATE 2 MG/ML IV SOLN
40.0000 mg | Freq: Once | INTRAVENOUS | Status: DC
  Filled 2019-03-01: qty 20

## 2019-03-01 NOTE — Progress Notes (Signed)
eLink Physician-Brief Progress Note Patient Name: Angelito Hopping DOB: [birth year] MRN: [id number]   Date of Service  03/01/2019  HPI/Events of Note  Notified of urinary retention requiring 3 straight cath for the past 24 hours and request for foley cath insertion  eICU Interventions  No urine output for the past 3 hours. Foley catheter ordered        [name] T Ameah Chanda 03/01/2019, 8:21 PM

## 2019-03-01 NOTE — Progress Notes (Signed)
Palliative:  HPI:53 y.o.malewith past medical history of none knownadmitted on 1/23/2021with shortness of breath with COVID ARDS, acute kidney injury, mild transaminitis.Required intubation 1/24 and hospitalization complicated by multiple strokes, bilateral pneumothorax requiring chest tube placement, and aortic clot. He has been unable to wean to extubatewithpoor neurological status.Overall prognosis is poor.   I met at Andrew Bruce's bedside and received update from bedside RN. No family at bedside. No changes as he continues with no further progress with vent weaning or decrease in sedation. Even when able to decrease sedation RN reports unable to follow commands and no improvement in neurological assessment. Unfortunately prognosis appears very poor but family would like to give him more time and have requested all efforts to keep him alive including full code in our family meeting last week.   I did discuss with Dr. Valeta Harms who is working to schedule tracheostomy most likely for tomorrow. I will touch base with family tomorrow.   Exam: Sedated on vent. Does not respond or follow any commands. 40% FiO2 on vent with no dyssynchrony. Abd soft. Bilateral hands cold to touch. Bilateral toes with embolic color changes.   Plan: - Planning for tracheostomy in next few days.   15 min  Vinie Sill, NP Palliative Medicine Team Pager [phone] (Please see [email or web address] for schedule) Team Phone [phone]    Greater than 50%  of this time was spent counseling and coordinating care related to the above assessment and plan

## 2019-03-01 NOTE — Progress Notes (Signed)
In and out cath x 1. 550 mL Clear/yellow urine.   Cranford Mon

## 2019-03-01 NOTE — Progress Notes (Signed)
NAME:  Andrew Bruce, MRN:  [id number], DOB:  [birth year], LOS: 23 ADMISSION DATE:  02/06/2019, CONSULTATION DATE:  02/06/19 REFERRING MD:  ER, CHIEF COMPLAINT:  SOB   Brief History   [name] without a past medical history admitted 1/23 with 48 hours of worsening shortness of breath in the context of a 10-day Covid illness (dry cough, lost taste/smell).  Chest x-ray and vitals are consistent with a worsening COVID ARDS.  Admitted for observation.  Developed worsening shortness of breath and increasing O2 needs. Decompensated requiring intubation 1/24.  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   Past Medical History  None known prior to admit   Significant Hospital Events   1/23 Admit  1/24 Decompensated, tx to ICU, intubated 1/25 b/l chest tubes placed 1/25 CVC  2/3 - flomax started  2/6 - rt chest tube connection changed  2/9 - Still no BM. Afebrile.  Left chest tube connection changed  2/10 -  Palliative crae meeting  -> full code, full aggressive care including trach., lact. 40% fio2, 10 peep.  Currently on propofol and fentanyl infusion.  Not on pressors.  On tube feeds.  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 afbrile snce 2/7. TGL improved to 161   Consults:  Neurology  Palliative care  Procedures:  ETT 1/24 >> Bilateral chest tube placement 1/25 >> R Femoral TLC 1/25 >>  RUE PICC  Significant Diagnostic Tests:  CT Head 2/2 >> no acute intracranial hemorrhage, large area of infarct in the left occipital lobe, likely subacute  CTA Chest 2/3 >> LLL segmental PE, no CT evidence of right heart strain, multifocal PNA, background emphysema and probable degree of interstitial fibrosis  ECHO 2/3 >> LVEF 55-60%, no WMA, hypokinetic right ventricular free wall, McConnells sign present.  RV findings consistent with acute cor pulmonale, small pericardial effusion, mildly elevated pulmonary  artery systolic pressure  Micro Data:  COVID 1/12 >> positive  PCT 1/23 >> Neg 2/5 blood cx >> negative 2/6 sputum cx>> abundant diphtheroids Corynebacterium species   Antimicrobials:  Remdesivir 02/06/19>>1/28 Dexamethasone 02/06/19>>2/2  xxxxxxxxxxxxxx vanc 2/5>> (2/12 - diphtheroids_ Zosyn 2/5>> 2/9, Cefepime 2/9 >>2/11)   Interim history/subjective:  No significant overnight events  Objective   Blood pressure 103/72, pulse (!) 108, temperature 98.4 F (36.9 C), temperature source Oral, resp. rate (!) 30, height 5\' 6"  (1.676 m), weight 82 kg, SpO2 99 %.    Vent Mode: PRVC FiO2 (%):  [40 %] 40 % Set Rate:  [30 bmp] 30 bmp Vt Set:  [380 mL] 380 mL PEEP:  [5 cmH20] 5 cmH20 Pressure Support:  [15 cmH20] 15 cmH20 Plateau Pressure:  [21 cmH20-30 cmH20] 21 cmH20   Intake/Output Summary (Last 24 hours) at 03/01/2019 1334 Last data filed at 03/01/2019 1200 Gross per 24 hour  Intake 1855 ml  Output 1500 ml  Net 355 ml   Filed Weights   02/26/19 0347 02/27/19 0415 03/01/19 0453  Weight: 84.9 kg 85.1 kg 82 kg    General Appearance: Chronically ill-appearing Head: Normocephalic, atraumatic H EENT: Endotracheal tube in place, moist oral mucosa Neck: No stridor Lungs: Coarse breath sounds bilaterally Heart: S1-S2, no murmur Abdomen: Nondistended, hypoactive bowel sounds Extremities: Toe ischemia bilaterally Skin: No rash Neurologic: RASS -3 to -4   Labs reviewed Chest x-ray reviewed personally showing no pneumothorax  Resolved Hospital Problem list   Oliguria, hypernatremia Completed COVID Tx Assessment & Plan:  Acute hypoxemic respiratory failure due  to COVID-19 pneumonia, ARDS, PE, left lower lobe pneumonia -Continue ventilator support -Will likely need tracheostomy as he is not able to wean  Bilateral pneumothoraces Continue chest tube to suction Follow chest x-ray changes  Pulmonary embolism Continue enoxaparin  Aortic clot -Continue  anticoagulation -Follow areas of skin injury -Local management of skin injury  Acute right heart failure -Cautious diuresis  Left occipital and right frontal CVA -Supportive measures -Continue Lipitor  COVID-19 -S/p remdesivir and Decadron  Ventilator associated pneumonia -Corynebacterium -Treated  Steroid-induced hyperglycemia -SSI, Levemir  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     Continue aspirin, full dose anticoagulation Attempting to wean sedation.  Unsure whether the occipital CVA will impact his airway protection and secretion management Continue Lipitor  Anemia critical illness Plan Conservative transfusion, goal Hgb > 7  Best practice:  Diet: Continue tube feeds Pain/Anxiety/Delirium protocol (if indicated): in place VAP protocol (if indicated): Ordered DVT prophylaxis: lovenox Rx dose of 75mg  bid with pharmacy monitoring GI prophylaxis: Pepcid Glucose control: SSI and Levemir Mobility: Bedrest GU - on foley.  Code Status: Full Code   Pall care 02/24/2019 Family Communication:  - wife [name] at bedside on 2/13 with Spanish interpreter Disposition: ICU  The patient is critically ill with multiple organ systems failure and requires high complexity decision making for assessment and support, frequent evaluation and titration of therapies, application of advanced monitoring technologies and extensive interpretation of multiple databases. Critical Care Time devoted to patient care services described in this note independent of APP/resident time (if applicable)  is 32 minutes.   Sherrilyn Rist MD [facility] Personal pager: [phone] If unanswered, please page CCM On-call: [phone]

## 2019-03-01 NOTE — Progress Notes (Signed)
PCCM:  Tracheostomy planned for tomorrow AM.  Tube feeds held in AM  Pre-trach orders placed  Lovenox held   Josephine Igo, DO [facility] 03/01/2019 2:54 PM

## 2019-03-01 NOTE — Progress Notes (Signed)
Nutrition Follow-up  DOCUMENTATION CODES:   Not applicable  INTERVENTION:   Will need Cortrak feeding tube (Cortrak service next available Tuesday, 2/16) unless PEG can be placed in the next few days.   Tube Feeding:  Continue Vital 1.5 at 50 ml/hr Continue Pro-Stat 30 mL TID Provides 2100 kcals, 126 g of protein and 912 mL of free water  TF regimen and propofol at current rate providing 2306 total kcal/day   Continue Juven BID, each packet provides 80 calories, 8 grams of carbohydrate, 2.5  grams of protein (collagen), 7 grams of L-arginine and 7 grams of L-glutamine; supplement contains CaHMB, Vitamins C, E, B12 and Zinc to promote wound healing  NUTRITION DIAGNOSIS:   Inadequate oral intake related to acute illness as evidenced by NPO status.  Being addressed via TF   GOAL:   Patient will meet greater than or equal to 90% of their needs  Progressing  MONITOR:   TF tolerance, Vent status, Labs, Weight trends  ASSESSMENT:   54 yo male admitted with acute respiratory failure due to COVID-19 pneumonia, ARDS requiring intubation, shock. No PMH   1/23 Admit 1/24 Intubated 1/25 B/L chest tubes placed 2/02 CT head: multiple ischemic infarcts   Palliative Care Team is following. Patient's family wants to continue full code status, full aggressive care. Plans for tracheostomy 2/16.   Patient is off respiratory isolation. Ongoing gangrene of the toes.   Patient remains intubated on ventilator support MV: 11.1 L/min Temp (24hrs), Avg:98.7 F (37.1 C), Min:98.2 F (36.8 C), Max:99.2 F (37.3 C)  Propofol at 11.6 ml/h providing 306 kcal from lipid.  TF order: Vital 1.5 at 50 ml/hr via OG tube, Pro-Stat 30 mL TID  Utilizing 76 kg as EDW  Labs reviewed. CBGs 111-105-129 Meds: MVI, Vit C, zinc sulfate, folic acid, ss novolog, novolog q 4 hours, levemir, Juven, Miralax, Senokot-S, thiamine   Diet Order:   Diet Order            Diet NPO time specified  Diet  effective midnight        Diet NPO time specified  Diet effective now              EDUCATION NEEDS:   Not appropriate for education at this time  Skin:  Skin Assessment: Skin Integrity Issues: Skin Integrity Issues:: Other (Comment) DTI: coccyx Other: gangrene of toes  Last BM:  2/15  Height:   Ht Readings from Last 1 Encounters:  02/06/19 5\' 6"  (1.676 m)    Weight:   Wt Readings from Last 1 Encounters:  03/01/19 82 kg    BMI:  Body mass index is 29.18 kg/m.  Estimated Nutritional Needs:   Kcal:  03/03/19 kcals  Protein:  120-145 g  Fluid:  >/= 2 L   [id number], RD, LDN, CNSC Please refer to Amion for contact information.

## 2019-03-01 NOTE — Progress Notes (Addendum)
eLink Physician-Brief Progress Note Patient Name: Andrew Bruce DOB: February 08, 1965 MRN: [id number]   Date of Service  03/01/2019  HPI/Events of Note  Pt. With urinary retention Frequent I&O cath x 3 in last 24 hours  eICU Interventions  Order placed for insertion of foley cath. Order placed for Flomax 0.4 mg daily with goal of discontinuing Foley cath in am.     Intervention Category Minor Interventions: Other:  Bevelyn Ngo 03/01/2019, 8:06 PM

## 2019-03-02 ENCOUNTER — Inpatient Hospital Stay (HOSPITAL_COMMUNITY): Payer: HRSA Program

## 2019-03-02 DIAGNOSIS — J9612 Chronic respiratory failure with hypercapnia: Secondary | ICD-10-CM

## 2019-03-02 DIAGNOSIS — Z978 Presence of other specified devices: Secondary | ICD-10-CM

## 2019-03-02 DIAGNOSIS — J9611 Chronic respiratory failure with hypoxia: Secondary | ICD-10-CM

## 2019-03-02 DIAGNOSIS — L8990 Pressure ulcer of unspecified site, unspecified stage: Secondary | ICD-10-CM | POA: Insufficient documentation

## 2019-03-02 LAB — BASIC METABOLIC PANEL
Anion gap: 7 (ref 5–15)
BUN: 25 mg/dL — ABNORMAL HIGH (ref 6–20)
CO2: 36 mmol/L — ABNORMAL HIGH (ref 22–32)
Calcium: 7.9 mg/dL — ABNORMAL LOW (ref 8.9–10.3)
Chloride: 96 mmol/L — ABNORMAL LOW (ref 98–111)
Creatinine, Ser: <0.3 mg/dL — ABNORMAL LOW (ref 0.61–1.24)
Glucose, Bld: 137 mg/dL — ABNORMAL HIGH (ref 70–99)
Potassium: 3.1 mmol/L — ABNORMAL LOW (ref 3.5–5.1)
Sodium: 139 mmol/L (ref 135–145)

## 2019-03-02 LAB — GLUCOSE, CAPILLARY
Glucose-Capillary: 100 mg/dL — ABNORMAL HIGH (ref 70–99)
Glucose-Capillary: 128 mg/dL — ABNORMAL HIGH (ref 70–99)
Glucose-Capillary: 128 mg/dL — ABNORMAL HIGH (ref 70–99)
Glucose-Capillary: 130 mg/dL — ABNORMAL HIGH (ref 70–99)
Glucose-Capillary: 144 mg/dL — ABNORMAL HIGH (ref 70–99)
Glucose-Capillary: 27 mg/dL — CL (ref 70–99)
Glucose-Capillary: 43 mg/dL — CL (ref 70–99)
Glucose-Capillary: 57 mg/dL — ABNORMAL LOW (ref 70–99)
Glucose-Capillary: 58 mg/dL — ABNORMAL LOW (ref 70–99)
Glucose-Capillary: 88 mg/dL (ref 70–99)

## 2019-03-02 LAB — PROTIME-INR
INR: 1.1 (ref 0.8–1.2)
Prothrombin Time: 13.7 seconds (ref 11.4–15.2)

## 2019-03-02 MED ORDER — DEXTROSE 50 % IV SOLN
INTRAVENOUS | Status: AC
  Administered 2019-03-02: 50 mL
  Filled 2019-03-02: qty 50

## 2019-03-02 MED ORDER — DEXMEDETOMIDINE HCL IN NACL 400 MCG/100ML IV SOLN
0.0000 ug/kg/h | INTRAVENOUS | Status: DC
  Administered 2019-03-02 (×2): 0.8 ug/kg/h via INTRAVENOUS
  Administered 2019-03-03 (×2): 0.7 ug/kg/h via INTRAVENOUS
  Administered 2019-03-03: 05:00:00 0.8 ug/kg/h via INTRAVENOUS
  Administered 2019-03-04: 08:00:00 1 ug/kg/h via INTRAVENOUS
  Administered 2019-03-04: 01:00:00 0.7 ug/kg/h via INTRAVENOUS
  Filled 2019-03-02 (×7): qty 100

## 2019-03-02 MED ORDER — DEXMEDETOMIDINE HCL IN NACL 400 MCG/100ML IV SOLN
0.4000 ug/kg/h | INTRAVENOUS | Status: DC
  Filled 2019-03-02: qty 100

## 2019-03-02 MED ORDER — DEXTROSE 50 % IV SOLN
INTRAVENOUS | Status: AC
  Filled 2019-03-02: qty 50

## 2019-03-02 MED ORDER — POTASSIUM CHLORIDE 20 MEQ/15ML (10%) PO SOLN
40.0000 meq | Freq: Two times a day (BID) | ORAL | Status: AC
  Administered 2019-03-02 (×2): 40 meq
  Filled 2019-03-02 (×2): qty 30

## 2019-03-02 MED ORDER — DEXTROSE 50 % IV SOLN
25.0000 g | INTRAVENOUS | Status: AC
  Administered 2019-03-02: 25 g via INTRAVENOUS

## 2019-03-02 MED ORDER — ENOXAPARIN SODIUM 80 MG/0.8ML ~~LOC~~ SOLN
80.0000 mg | Freq: Two times a day (BID) | SUBCUTANEOUS | Status: DC
  Administered 2019-03-02 – 2019-03-08 (×12): 80 mg via SUBCUTANEOUS
  Filled 2019-03-02 (×12): qty 0.8

## 2019-03-02 NOTE — Progress Notes (Addendum)
Inpatient Diabetes Program Recommendations  AACE/ADA: New Consensus Statement on Inpatient Glycemic Control (2015)  Target Ranges:  Prepandial:   less than 140 mg/dL      Peak postprandial:   less than 180 mg/dL (1-2 hours)      Critically ill patients:  140 - 180 mg/dL   Lab Results  Component Value Date   GLUCAP 128 (H) 03/02/2019   HGBA1C 11.7 (H) 02/17/2019    Review of Glycemic Control Results for CADELL, GABRIELSON (MRN [id number]) as of 03/02/2019 14:41  Ref. Range 03/02/2019 07:57 03/02/2019 09:35 03/02/2019 11:32 03/02/2019 11:54  Glucose-Capillary Latest Ref Range: 70 - 99 mg/dL 57 (L) 295 (H) 58 (L) 747 (H)   Diabetes history: New onset Outpatient Diabetes medications: none Current orders for Inpatient glycemic control: Novolog 2 units Q4H, Levemir 40 units BID, Novolog 0-20 units Q4H  Inpatient Diabetes Program Recommendations:    Noted hypoglycemia of 43 mg/dL following administration of tube feed coverage and Levemir 40 units BID, along with interventions. Patient was NPO for scheduled procedure, thus tube feeds were stopped.  Consider decreasing Levemir to 34 units BID.   Thanks, Lujean Rave, MSN, RNC-OB Diabetes Coordinator [phone] (8a-5p)

## 2019-03-02 NOTE — Progress Notes (Signed)
Hypoglycemic Event  CBG: 43  Treatment: D50 50 mL (25 gm)  Symptoms: None  Follow-up CBG: Time: 0448 CBG Result:130  Possible Reasons for Event: Inadequate meal intake     Cranford Mon

## 2019-03-02 NOTE — Progress Notes (Signed)
Palliative:  HPI:54 y.o.malewith past medical history of none knownadmitted on 1/23/2021with shortness of breath with COVID ARDS, acute kidney injury, mild transaminitis.Required intubation 1/24 and hospitalization complicated by multiple strokes, bilateral pneumothorax requiring chest tube placement, and aortic clot. He has been unable to wean to extubatewithpoor neurological status.Overall prognosis is poor.   I met today at Mr. Davitt's bedside. No family present. No changes per bedside RN. He continues to require sedation with no signs of neurological response or progress. Plans for tracheostomy ~1130-1200 today.   I called and spoke with daughter, [name]. I updated her on plans for tracheostomy plans for today and that he continues without any further progress or improvements. They are hopeful that he may improve after tracheostomy placed and hopeful he will be able to tolerate decreased sedation and weaning. I did reiterate that even with tracheostomy there is a chance we will not see any neurological improvement.   All questions/concerns addressed. Emotional support provided.   Exam: Unresponsive. Does not follow any commands. No movement. No distress. Tolerating vent with sedation and no vent dyssynchrony. Abd flat, soft. Bilateral ischemic changes to toes.   Plan: - Family continue to desire full aggressive care, full code.   15 min  Vinie Sill, NP Palliative Medicine Team Pager [phone] (Please see [email or web address] for schedule) Team Phone [phone]    Greater than 50%  of this time was spent counseling and coordinating care related to the above assessment and plan

## 2019-03-02 NOTE — Procedures (Signed)
Bronchoscopy Procedure Note Lenville Hibberd [id number] 05-Aug-1965  Procedure: Bronchoscopy Indications: Tracheostomy tube placement   Procedure Details Consent: Risks of procedure as well as the alternatives and risks of each were explained to the (patient/caregiver).  Consent for procedure obtained. Time Out: Verified patient identification, verified procedure, site/side was marked, verified correct patient position, special equipment/implants available, medications/allergies/relevent history reviewed, required imaging and test results available.  Performed  In preparation for procedure, patient was given 100% FiO2 and bronchoscope lubricated. Sedation: Propofol   Bronchoscope was introduced through the endotracheal tube, Carina noted, right lung was examined, left lung was examined.  Noted for thick mucoid secretions, some plugs removed Bronchoscope was withdrawn en bloc with the endotracheal tube to facilitate tracheostomy tube placement Tracheostomy tube placement was visualized. Bronchoscope withdrawn and introduced through the new tracheostomy tube, airway reexamined, suctioned effectively Tolerated procedure well  Evaluation Hemodynamic Status: BP stable throughout; O2 sats: stable throughout Patient's Current Condition: stable Specimens:  None Complications: No apparent complications Patient did tolerate procedure well.   Kymberley Raz A Keren Alverio 03/02/2019

## 2019-03-02 NOTE — Progress Notes (Signed)
NAME:  Andrew Bruce, MRN:  [id number], DOB:  October 25, 1965, LOS: 24 ADMISSION DATE:  02/06/2019, CONSULTATION DATE:  02/06/19 REFERRING MD:  ER, CHIEF COMPLAINT:  SOB   Brief History   [name] without a past medical history admitted 1/23 with 48 hours of worsening shortness of breath in the context of a 10-day Covid illness (dry cough, lost taste/smell).  Chest x-ray and vitals are consistent with a worsening COVID ARDS.  Admitted for observation.  Developed worsening shortness of breath and increasing O2 needs. Decompensated requiring intubation 1/24.  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   Past Medical History  None known prior to admit   Significant Hospital Events   1/23 Admit  1/24 Decompensated, tx to ICU, intubated 1/25 b/l chest tubes placed 1/25 CVC  2/3 - flomax started  2/6 - rt chest tube connection changed  2/9 - Still no BM. Afebrile.  Left chest tube connection changed  2/10 -  Palliative crae meeting  -> full code, full aggressive care including trach., lact. 40% fio2, 10 peep.  Currently on propofol and fentanyl infusion.  Not on pressors.  On tube feeds.  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 afbrile snce 2/7. TGL improved to 161   Consults:  Neurology  Palliative care  Procedures:  ETT 1/24 >> Bilateral chest tube placement 1/25 >> R Femoral TLC 1/25 >>  RUE PICC  Significant Diagnostic Tests:  CT Head 2/2 >> no acute intracranial hemorrhage, large area of infarct in the left occipital lobe, likely subacute  CTA Chest 2/3 >> LLL segmental PE, no CT evidence of right heart strain, multifocal PNA, background emphysema and probable degree of interstitial fibrosis  ECHO 2/3 >> LVEF 55-60%, no WMA, hypokinetic right ventricular free wall, McConnells sign present.  RV findings consistent with acute cor pulmonale, small pericardial effusion, mildly elevated pulmonary  artery systolic pressure  Micro Data:  COVID 1/12 >> positive  PCT 1/23 >> Neg 2/5 blood cx >> negative 2/6 sputum cx>> abundant diphtheroids Corynebacterium species   Antimicrobials:  Remdesivir 02/06/19>>1/28 Dexamethasone 02/06/19>>2/2  xxxxxxxxxxxxxx vanc 2/5>> (2/12 - diphtheroids_ Zosyn 2/5>> 2/9, Cefepime 2/9 >>2/11)   Interim history/subjective:  No significant overnight events Elevated triglycerides overnight  Objective   Blood pressure 116/86, pulse (!) 102, temperature (!) 97.4 F (36.3 C), temperature source Axillary, resp. rate (!) 33, height 5\' 6"  (1.676 m), weight 83.4 kg, SpO2 99 %.    Vent Mode: PRVC FiO2 (%):  [40 %] 40 % Set Rate:  [30 bmp] 30 bmp Vt Set:  [380 mL] 380 mL PEEP:  [5 cmH20] 5 cmH20 Plateau Pressure:  [21 cmH20-32 cmH20] 22 cmH20   Intake/Output Summary (Last 24 hours) at 03/02/2019 1122 Last data filed at 03/02/2019 1100 Gross per 24 hour  Intake 1341.04 ml  Output 1940 ml  Net -598.96 ml   Filed Weights   02/27/19 0415 03/01/19 0453 03/02/19 0424  Weight: 85.1 kg 82 kg 83.4 kg    General Appearance: Chronically ill-appearing Head: Normocephalic, atraumatic H EENT: Endotracheal tube in place, moist oral mucosa Neck: No stridor Lungs: Coarse breath sounds bilaterally Heart: S1-S2, no murmur Abdomen: Nondistended, hypoactive bowel sounds Extremities: Toe ischemia bilaterally Skin: No rash Neurologic: Unresponsive  Labs reviewed Chest x-ray reviewed personally showing no pneumothorax 2/15  Resolved Hospital Problem list   Oliguria, hypernatremia Completed COVID Tx  Assessment & Plan:  Acute hypoxemic respiratory failure due to COVID-19 pneumonia, ARDS, PE,  left lower lobe pneumonia Continue ventilator support Plan for tracheostomy today  Bilateral pneumothoraces Continue chest tube Follow chest x-ray changes  Pulmonary embolism Continue Lovenox  Aortic clot Continue Lovenox Follow areas of skin injury Ischemic foot  changes, gangrenous changes  Acute right heart failure Cautious diuresis  Left occipital and right frontal CVA -Supportive measures -Continue Lipitor  COVID-19 -Completed Decadron, doubt remdesivir  Ventilator associated pneumonia -Treated  Steroid-induced hyperglycemia -SSI, Levemir  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     Continue aspirin, full dose anticoagulation Attempting to wean sedation.  Unsure whether the occipital CVA will impact his airway protection and secretion management Continue Lipitor  Anemia critical illness Plan Conservative transfusion, goal Hgb > 7  Elevated triglycerides -Switch to Precedex  Best practice:  Diet: Continue tube feeds Pain/Anxiety/Delirium protocol (if indicated): We will switch to Precedex from propofol VAP protocol (if indicated): Ordered DVT prophylaxis: lovenox Rx dose of 75mg  bid with pharmacy monitoring GI prophylaxis: Pepcid Glucose control: SSI and Levemir Mobility: Bedrest GU - on foley.  Code Status: Full Code   Pall care 02/24/2019 Family Communication:  - wife Okechukwu Regnier at bedside on 2/13 with Spanish interpreter Disposition: ICU  The patient is critically ill with multiple organ systems failure and requires high complexity decision making for assessment and support, frequent evaluation and titration of therapies, application of advanced monitoring technologies and extensive interpretation of multiple databases. Critical Care Time devoted to patient care services described in this note independent of APP/resident time (if applicable)  is 32 minutes.   3/13 MD  Pulmonary Critical Care Personal pager: [phone] If unanswered, please page CCM On-call: #[phone]

## 2019-03-02 NOTE — Progress Notes (Signed)
eLink Physician-Brief Progress Note Patient Name: Andrew Bruce DOB: August 26, 1965 MRN: [id number]   Date of Service  03/02/2019  HPI/Events of Note  Request for labs including triglycerides as patient on propofol  eICU Interventions  Ordered BMP Patient had TG done yesterday and 2 days prior both were elevated. Suggest to hold propofol and transition  To Precedex     Intervention Category Intermediate Interventions: Other:  Darl Pikes 03/02/2019, 4:40 AM

## 2019-03-02 NOTE — Procedures (Signed)
Cortrak  Person Inserting Tube:  Jannie Doyle E, RD Tube Type:  Cortrak - 43 inches Tube Location:  Left nare Initial Placement:  Stomach Secured by: Bridle Technique Used to Measure Tube Placement:  Documented cm marking at nare/ corner of mouth Cortrak Secured At:  61 cm   Cortrak Tube Team Note:  Consult received to place a Cortrak feeding tube.   No x-ray is required. RN may begin using tube.   If the tube becomes dislodged please keep the tube and contact the Cortrak team at [email or web address] (password [email or web address]) for replacement.  If after hours and replacement cannot be delayed, place a NG tube and confirm placement with an abdominal x-ray.    Aakash Hollomon, MS, RD, LDN RD pager number and weekend/on-call pager number located in Amion.    

## 2019-03-02 NOTE — Procedures (Addendum)
Procedure: Percutaneous Tracheostomy CPT 31600 Performed by: Colin Benton, NP Bronchoscopy Assistant: Dr Wynona Neat .  Indications: Chronic respiratory failure and need for ongoing mechanical ventilation.  Consent: Given patient's intubation and sedation, the patient was unable to provide consent. Discussed the procedure with the patient's decision maker, including the indications, risks, benefits, and alternatives. All questions were answered. Verbal witnessed consent was obtained and placed in the chart.  Preprocedure: Universal protocol was followed for this procedure. Prior to the initiation of sedation or the procedure, a timeout/"Pause for the Cause" was performed. The patient's identity was verified by confirming the patient's wrist band for name, date of birth, and medical record number. Everyone in the room was in agreement with the patient identify, the procedure to be performed, consent was in place and matched the planned procedure, and the procedure site. The area was cleaned with a CHG scrub and draped with large sterile barrier. Hand hygiene was performed, and cap, mask, sterile gown, and sterile gloves were worn. The patient was covered by a large sterile drape. Sterile technique was maintained for the entire procedure.  Anesthesia: The patient was intubated and sedated prior to the procedure. Additional midazolam, etomidate, fentanyl and rocuronium were given for sedation and paralysis with close attention to vital signs throughout procedure. Please refer to the accompanying procedural sedation form for additional details. Once the patient was adequately sedated and with continuous BIS monitoring, vecuronium was administered for paralysis.  Procedure: The patient was placed in the supine position. The anterior neck was prepped and draped in usual sterile fashion. 1% lidocaine was administered approximately 2 fingerbreadths above the sternal notch for local anesthesia. A  1.5-cm vertical incision was then performed 2 fingerbreadths above the sternal notch. Using a curved Kelly, blunt dissection was performed down to the level of the pretracheal fascia. At this point, the bronchoscope was introduced through the endotracheal tube and the trachea was properly visualized. The endotracheal tube was then gradually withdrawn within the trachea under direct bronchoscopic visualization. Proper midline position was confirmed by bouncing the needle from the tracheostomy tray over the trachea with bronchoscopic examination. The needle was advanced into the trachea and proper positioning was confirmed with direct visualization. The needle was then removed leaving a white outer cannula in position. The wire from the tracheostomy tray was then advanced through the white outer cannula. The cannula was then removed. The small, blue dilator was then advanced over the wire into the trachea. Once proper dilatation was achieved, the dilator was removed. The large, tapered dilator was then advanced over the wire into the trachea. The dilator was removed leaving the wire and white inner cannula in position. A number 6 percutaneous Shiley tracheostomy tube was then advanced over the wire and white inner cannula into the trachea. Proper positioning was confirmed with bronchoscopic visualization. The tracheostomy tube was then sutured in place with four nylon sutures. It was further secured with a tracheostomy tie.  Estimated blood loss: Less than 5 mL.  Complications: None immediate.  CXR ordered.  Simonne Martinet ACNP-BC [facility] Pulmonary/Critical Care Pager # [phone] OR # [phone] if no answer  I was present at bedside.  Gowned and gloved.  Participated in all key portions of the procedure as defined above.  With direct supervision and intervention where necessary with [name] Simmonds, NP.  Josephine Igo, DO [facility]monary Critical [facility] 03/02/2019 4:43 PM

## 2019-03-03 ENCOUNTER — Inpatient Hospital Stay (HOSPITAL_COMMUNITY): Payer: HRSA Program

## 2019-03-03 LAB — GLUCOSE, CAPILLARY
Glucose-Capillary: 100 mg/dL — ABNORMAL HIGH (ref 70–99)
Glucose-Capillary: 118 mg/dL — ABNORMAL HIGH (ref 70–99)
Glucose-Capillary: 119 mg/dL — ABNORMAL HIGH (ref 70–99)
Glucose-Capillary: 146 mg/dL — ABNORMAL HIGH (ref 70–99)
Glucose-Capillary: 180 mg/dL — ABNORMAL HIGH (ref 70–99)
Glucose-Capillary: 191 mg/dL — ABNORMAL HIGH (ref 70–99)
Glucose-Capillary: 56 mg/dL — ABNORMAL LOW (ref 70–99)
Glucose-Capillary: 77 mg/dL (ref 70–99)
Glucose-Capillary: 82 mg/dL (ref 70–99)

## 2019-03-03 LAB — BASIC METABOLIC PANEL
Anion gap: 9 (ref 5–15)
BUN: 20 mg/dL (ref 6–20)
CO2: 33 mmol/L — ABNORMAL HIGH (ref 22–32)
Calcium: 8.1 mg/dL — ABNORMAL LOW (ref 8.9–10.3)
Chloride: 101 mmol/L (ref 98–111)
Creatinine, Ser: 0.38 mg/dL — ABNORMAL LOW (ref 0.61–1.24)
GFR calc Af Amer: >60 mL/min (ref >60)
GFR calc non Af Amer: >60 mL/min (ref >60)
Glucose, Bld: 139 mg/dL — ABNORMAL HIGH (ref 70–99)
Potassium: 4.1 mmol/L (ref 3.5–5.1)
Sodium: 143 mmol/L (ref 135–145)

## 2019-03-03 LAB — MAGNESIUM: Magnesium: 2.4 mg/dL (ref 1.7–2.4)

## 2019-03-03 MED ORDER — CLONAZEPAM 1 MG PO TABS
1.0000 mg | ORAL_TABLET | Freq: Three times a day (TID) | ORAL | Status: DC
  Administered 2019-03-03 – 2019-03-04 (×3): 1 mg
  Filled 2019-03-03 (×3): qty 1

## 2019-03-03 MED ORDER — ACETAZOLAMIDE 250 MG PO TABS
250.0000 mg | ORAL_TABLET | Freq: Once | ORAL | Status: AC
  Administered 2019-03-03: 250 mg via ORAL
  Filled 2019-03-03: qty 1

## 2019-03-03 MED ORDER — DEXTROSE 50 % IV SOLN
INTRAVENOUS | Status: AC
  Filled 2019-03-03: qty 50

## 2019-03-03 MED ORDER — DEXTROSE 5 % IV SOLN
500.0000 mg | Freq: Once | INTRAVENOUS | Status: AC
  Administered 2019-03-03: 11:00:00 500 mg via INTRAVENOUS
  Filled 2019-03-03: qty 500

## 2019-03-03 MED ORDER — DEXTROSE 50 % IV SOLN
1.0000 | Freq: Once | INTRAVENOUS | Status: AC
  Administered 2019-03-03: 15:00:00 25 mL via INTRAVENOUS

## 2019-03-03 NOTE — Progress Notes (Signed)
NAME:  Andrew Bruce, MRN:  [id number], DOB:  14-Jul-1965, LOS: 25 ADMISSION DATE:  02/06/2019, CONSULTATION DATE:  02/06/19 REFERRING MD:  ER, CHIEF COMPLAINT:  SOB   Brief History   [name] without a past medical history admitted 1/23 with 48 hours of worsening shortness of breath in the context of a 10-day Covid illness (dry cough, lost taste/smell).  Chest x-ray and vitals are consistent with a worsening COVID ARDS.  Admitted for observation.  Developed worsening shortness of breath and increasing O2 needs. Decompensated requiring intubation 1/24.  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   Past Medical History  None known prior to admit   Significant Hospital Events   1/23 Admit  1/24 Decompensated, tx to ICU, intubated 1/25 b/l chest tubes placed 1/25 CVC  2/3 - flomax started  2/6 - rt chest tube connection changed  2/9 - Still no BM. Afebrile.  Left chest tube connection changed  2/10 -  Palliative crae meeting  -> full code, full aggressive care including trach., lact. 40% fio2, 10 peep.  Currently on propofol and fentanyl infusion.  Not on pressors.  On tube feeds.  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 afbrile snce 2/7. TGL improved to 161  2/16 Trach   Consults:  Neurology  Palliative care  Procedures:  ETT 1/24 >> Bilateral chest tube placement 1/25 >> R Femoral TLC 1/25 >>  RUE PICC  Significant Diagnostic Tests:  CT Head 2/2 >> no acute intracranial hemorrhage, large area of infarct in the left occipital lobe, likely subacute  CTA Chest 2/3 >> LLL segmental PE, no CT evidence of right heart strain, multifocal PNA, background emphysema and probable degree of interstitial fibrosis  ECHO 2/3 >> LVEF 55-60%, no WMA, hypokinetic right ventricular free wall, McConnells sign present.  RV findings consistent with acute cor pulmonale, small pericardial effusion, mildly elevated  pulmonary artery systolic pressure  Micro Data:  COVID 1/12 >> positive  PCT 1/23 >> Neg 2/5 blood cx >> negative 2/6 sputum cx>> abundant diphtheroids Corynebacterium species   Antimicrobials:  Remdesivir 02/06/19>>1/28 Dexamethasone 02/06/19>>2/2  xxxxxxxxxxxxxx vanc 2/5>> (2/12 - diphtheroids_ Zosyn 2/5>> 2/9, Cefepime 2/9 >>2/11)   Interim history/subjective:  Uneventful trach yesterday Remains obtunded on vent.  Objective   Blood pressure (!) 81/51, pulse 71, temperature 99.5 F (37.5 C), temperature source Axillary, resp. rate (!) 30, height 5\' 6"  (1.676 m), weight 82.8 kg, SpO2 96 %.    Vent Mode: PRVC FiO2 (%):  [40 %-100 %] 40 % Set Rate:  [30 bmp] 30 bmp Vt Set:  [38 mL-380 mL] 380 mL PEEP:  [5 cmH20] 5 cmH20 Plateau Pressure:  [13 cmH20-22 cmH20] 13 cmH20   Intake/Output Summary (Last 24 hours) at 03/03/2019 0950 Last data filed at 03/03/2019 0800 Gross per 24 hour  Intake 1445 ml  Output 1370 ml  Net 75 ml   Filed Weights   03/01/19 0453 03/02/19 0424 03/03/19 0446  Weight: 82 kg 83.4 kg 82.8 kg    General Appearance: Chronically ill-appearing Head: Normocephalic, atraumatic H EENT: tracheostomy tube in place, moist oral mucosa Neck: midline trachea Lungs: Coarse breath sounds bilaterally Heart: S1-S2, no murmur Abdomen: Nondistended, hypoactive bowel sounds Extremities: Toe ischemia bilaterally Skin: No rash  Neurologic: Unresponsive  Labs reviewed Chest x-ray reviewed personally showing no pneumothorax 2/15  Resolved Hospital Problem list   Oliguria, hypernatremia Completed COVID Tx  Assessment & Plan:  Acute hypoxemic respiratory failure due  to COVID-19 pneumonia, ARDS, PE, left lower lobe pneumonia -Wean sedation (fentanyl gtt weaning, dropped kloninpin in half) -PS trial today  Bilateral pneumothoraces -Clamp tubes, afternoon CXR.  Pull if stable.  Pulmonary embolism -Continue Lovenox  Aortic clot -Continue Lovenox -Follow areas of  skin injury -Ischemic foot changes, gangrenous changes  Acute right heart failure -Diuril + diamox today.  Left occipital and right frontal CVA -Supportive measures -Continue Lipitor  COVID-19 -Completed Decadron, remdesivir  Ventilator associated pneumonia -Treated  Steroid-induced hyperglycemia -SSI, Levemir  Anemia critical illness Plan Conservative transfusion, goal Hgb > 7  Elevated triglycerides -Hold off on further propofol  Best practice:  Diet: Continue tube feeds Pain/Anxiety/Delirium protocol (if indicated): see above VAP protocol (if indicated): Ordered DVT prophylaxis: lovenox Rx dose of 75mg  bid with pharmacy monitoring GI prophylaxis: Pepcid Glucose control: SSI and Levemir Mobility: Bedrest GU - on foley.  Code Status: Full Code   Pall care 02/24/2019 Family Communication:  -will try to reach out today Disposition: ICU  The patient is critically ill with multiple organ systems failure and requires high complexity decision making for assessment and support, frequent evaluation and titration of therapies, application of advanced monitoring technologies and extensive interpretation of multiple databases. Critical Care Time devoted to patient care services described in this note independent of APP/resident time (if applicable)  is 32 minutes.   Erskine Emery MD CCM On-call: [phone]

## 2019-03-03 NOTE — Progress Notes (Signed)
Palliative:  No change in neurological status. Still unable to tolerate decreased sedation or vent weaning. No family at bedside.   No charge  Yong Channel, NP Palliative Medicine Team Pager [phone] (Please see [email or web address] for schedule) Team Phone [phone]

## 2019-03-03 NOTE — Progress Notes (Addendum)
Hypoglycemic Event  CBG: 56  Treatment: D50 50ml Symptoms: none Follow-up CBG:82 Time:16:10   Possible Reasons for Event:TF off for 1 hour waiting on pharmacy to send Comments/MD notified:n/a   Axel Filler

## 2019-03-03 NOTE — Progress Notes (Signed)
CSW reviewed patient's chart and determined that he has already been screened for Medicaid, the assigned financial counselor is Axel Filler. Emergency Medicaid will be submitted for at time of discharge.  Edwin Dada, MSW, LCSW-A Transitions of Care  Clinical Social Worker  [facility] Emergency Departments  Medical ICU [phone]

## 2019-03-03 NOTE — Evaluation (Signed)
Physical Therapy Evaluation Patient Details Name: Andrew Bruce MRN: [id number] DOB: 1965-05-12 Today's Date: 03/03/2019   History of Present Illness  [name] without a past medical history admitted 1/23 with 48 hours of worsening shortness of breath in the context of a 10-day Covid illness (dry cough, lost taste/smell).  Chest x-ray and vitals were consistent with a worsening COVID ARDS.  Admitted for observation.  Developed worsening shortness of breath and increasing O2 needs. Decompensated requiring intubation 1/24.  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   Pt with trach and on vent.  Also with bilateral chest tubes.  Has developed gangrene of toes as well.    Clinical Impression  Pt admitted with above diagnosis. Pt did not respond to commands for any mobility or exercise. Bed level eval only as pt non responsive for the most part. Will follow and progress pt as able.  Pt currently with functional limitations due to the deficits listed below (see PT Problem List). Pt will benefit from skilled PT to increase their independence and safety with mobility to allow discharge to the venue listed below.      Follow Up Recommendations LTACH;Supervision/Assistance - 24 hour    Equipment Recommendations  Other (comment)(TBA)    Recommendations for Other Services       Precautions / Restrictions Precautions Precautions: Fall Precaution Comments: bilateral chest tubes, trach with vent      Mobility  Bed Mobility Overal bed mobility: Needs Assistance Bed Mobility: Rolling Rolling: Total assist;+2 for physical assistance         General bed mobility comments: Total assist as pt does not follwo commands.   Transfers                 General transfer comment: Pt not appropriate as he is not following commands.   Ambulation/Gait             General Gait Details: NT  Stairs            Wheelchair  Mobility    Modified Rankin (Stroke Patients Only) Modified Rankin (Stroke Patients Only) Pre-Morbid Rankin Score: No significant disability Modified Rankin: Severe disability     Balance                                             Pertinent Vitals/Pain Pain Assessment: (unable to assess cue to pt unresponsive)    Home Living Family/patient expects to be discharged to:: Private residence Living Arrangements: Spouse/significant other               Additional Comments: Unable to determine prior level of function as pt not responsive.     Prior Function                 Hand Dominance        Extremity/Trunk Assessment   Upper Extremity Assessment Upper Extremity Assessment: Defer to OT evaluation    Lower Extremity Assessment Lower Extremity Assessment: RLE deficits/detail;LLE deficits/detail RLE Deficits / Details: No active movement to command LLE Deficits / Details: no active movement to command       Communication   Communication: Tracheostomy  Cognition Arousal/Alertness: Lethargic Behavior During Therapy: Flat affect Overall Cognitive Status: Difficult to assess  General Comments General comments (skin integrity, edema, etc.): Nurse states RR up to 50 earlier with pt coughing.  Pt had meds on board due to incr RR.     Exercises General Exercises - Upper Extremity Shoulder Flexion: PROM;Both;5 reps;Supine Shoulder Extension: PROM;Both;5 reps;Supine Elbow Flexion: PROM;Both;5 reps;Supine Elbow Extension: PROM;Both;5 reps;Supine Wrist Flexion: PROM;Both;5 reps;Supine General Exercises - Lower Extremity Ankle Circles/Pumps: PROM;Both;5 reps;Supine Heel Slides: PROM;Both;5 reps;Supine   Assessment/Plan    PT Assessment Patient needs continued PT services  PT Problem List Decreased strength;Decreased range of motion;Decreased activity tolerance;Decreased  balance;Decreased mobility;Decreased safety awareness;Cardiopulmonary status limiting activity       PT Treatment Interventions Functional mobility training;Therapeutic activities;Therapeutic exercise;Balance training;Patient/family education;Wheelchair mobility training    PT Goals (Current goals can be found in the Care Plan section)  Acute Rehab PT Goals Patient Stated Goal: unable to assess PT Goal Formulation: Patient unable to participate in goal setting Time For Goal Achievement: 03/17/19 Potential to Achieve Goals: Fair    Frequency Min 2X/week   Barriers to discharge        Co-evaluation               AM-PAC PT "6 Clicks" Mobility  Outcome Measure Help needed turning from your back to your side while in a flat bed without using bedrails?: Total Help needed moving from lying on your back to sitting on the side of a flat bed without using bedrails?: Total Help needed moving to and from a bed to a chair (including a wheelchair)?: Total Help needed standing up from a chair using your arms (e.g., wheelchair or bedside chair)?: Total Help needed to walk in hospital room?: Total Help needed climbing 3-5 steps with a railing? : Total 6 Click Score: 6    End of Session Equipment Utilized During Treatment: Gait belt;Oxygen(vent with trach) Activity Tolerance: Patient limited by fatigue Patient left: in bed;with call bell/phone within reach;with bed alarm set;with nursing/sitter in room Nurse Communication: Mobility status;Need for lift equipment PT Visit Diagnosis: Muscle weakness (generalized) (M62.81)    Time: 2951-8841 PT Time Calculation (min) (ACUTE ONLY): 13 min   Charges:   PT Evaluation $PT Eval Moderate Complexity: 1 Mod          Charleston Hankin W,PT Acute Rehabilitation Services Pager:  [phone]  Office:  [phone]    [name] 03/03/2019, 1:41 PM

## 2019-03-03 NOTE — Progress Notes (Signed)
Both chest tubes removed and occlusive dressing applied.  Soft tissue debris noted to be completely occluding both tubes on distal end.  Myrla Halsted MD PCCM

## 2019-03-03 NOTE — Progress Notes (Signed)
eLink Physician-Brief Progress Note Patient Name: Andrew Bruce DOB: June 27, 1965 MRN: [id number]   Date of Service  03/03/2019  HPI/Events of Note  RN called, pt CBG 100. Pt had hypoglycemic event Requesting decreased dose of Levemir  eICU Interventions  Give 20 units Levemir tonight. ( Half dose) Consult for inpatient diabetes coordinator ordered Continue Q 4 CBG's  Have am rounding team assess need for adjustment of Levemir dose     Intervention Category Major Interventions: Other:  Bevelyn Ngo 03/03/2019, 10:30 PM

## 2019-03-03 NOTE — Progress Notes (Signed)
RT placed patient back on full support from the ventilator due to increased work of breathing.

## 2019-03-03 NOTE — Progress Notes (Signed)
  Speech Language Pathology  Patient Details Name: Andrew Bruce MRN: [id number] DOB: 1965/09/12 Today's Date: 03/03/2019 Time:  -       SLP received order for PMV and swallow. Pt trach'd yesterday and on vent. Will follow along for appropriateness for inline PMV versus ability to wean to trach collar.    Royce Macadamia 03/03/2019, 8:21 AM  Breck Coons Lonell Face.Ed Nurse, children's [phone] Office [phone]

## 2019-03-04 ENCOUNTER — Inpatient Hospital Stay (HOSPITAL_COMMUNITY): Payer: HRSA Program

## 2019-03-04 LAB — GLUCOSE, CAPILLARY
Glucose-Capillary: 113 mg/dL — ABNORMAL HIGH (ref 70–99)
Glucose-Capillary: 147 mg/dL — ABNORMAL HIGH (ref 70–99)
Glucose-Capillary: 151 mg/dL — ABNORMAL HIGH (ref 70–99)
Glucose-Capillary: 162 mg/dL — ABNORMAL HIGH (ref 70–99)
Glucose-Capillary: 170 mg/dL — ABNORMAL HIGH (ref 70–99)
Glucose-Capillary: 193 mg/dL — ABNORMAL HIGH (ref 70–99)
Glucose-Capillary: 263 mg/dL — ABNORMAL HIGH (ref 70–99)
Glucose-Capillary: 95 mg/dL (ref 70–99)

## 2019-03-04 LAB — POCT I-STAT 7, (LYTES, BLD GAS, ICA,H+H)
Acid-Base Excess: 2 mmol/L (ref 0.0–2.0)
Bicarbonate: 27.5 mmol/L (ref 20.0–28.0)
Calcium, Ion: 1.18 mmol/L (ref 1.15–1.40)
HCT: 29 % — ABNORMAL LOW (ref 39.0–52.0)
Hemoglobin: 9.9 g/dL — ABNORMAL LOW (ref 13.0–17.0)
O2 Saturation: 96 %
Patient temperature: 101.7
Potassium: 3.5 mmol/L (ref 3.5–5.1)
Sodium: 136 mmol/L (ref 135–145)
TCO2: 29 mmol/L (ref 22–32)
pCO2 arterial: 48.1 mmHg — ABNORMAL HIGH (ref 32.0–48.0)
pH, Arterial: 7.372 (ref 7.350–7.450)
pO2, Arterial: 90 mmHg (ref 83.0–108.0)

## 2019-03-04 LAB — BASIC METABOLIC PANEL
Anion gap: 9 (ref 5–15)
BUN: 17 mg/dL (ref 6–20)
CO2: 26 mmol/L (ref 22–32)
Calcium: 8.1 mg/dL — ABNORMAL LOW (ref 8.9–10.3)
Chloride: 100 mmol/L (ref 98–111)
Creatinine, Ser: 0.52 mg/dL — ABNORMAL LOW (ref 0.61–1.24)
GFR calc Af Amer: >60 mL/min (ref >60)
GFR calc non Af Amer: >60 mL/min (ref >60)
Glucose, Bld: 145 mg/dL — ABNORMAL HIGH (ref 70–99)
Potassium: 3.5 mmol/L (ref 3.5–5.1)
Sodium: 135 mmol/L (ref 135–145)

## 2019-03-04 LAB — CBC
HCT: 27.7 % — ABNORMAL LOW (ref 39.0–52.0)
Hemoglobin: 8.5 g/dL — ABNORMAL LOW (ref 13.0–17.0)
MCH: 27.2 pg (ref 26.0–34.0)
MCHC: 30.7 g/dL (ref 30.0–36.0)
MCV: 88.8 fL (ref 80.0–100.0)
Platelets: 637 10*3/uL — ABNORMAL HIGH (ref 150–400)
RBC: 3.12 MIL/uL — ABNORMAL LOW (ref 4.22–5.81)
RDW: 14.3 % (ref 11.5–15.5)
WBC: 22.3 10*3/uL — ABNORMAL HIGH (ref 4.0–10.5)
nRBC: 0 % (ref 0.0–0.2)

## 2019-03-04 LAB — PROCALCITONIN: Procalcitonin: 0.59 ng/mL

## 2019-03-04 LAB — MAGNESIUM: Magnesium: 2.2 mg/dL (ref 1.7–2.4)

## 2019-03-04 MED ORDER — SODIUM CHLORIDE 0.9 % IV SOLN
3.0000 g | Freq: Four times a day (QID) | INTRAVENOUS | Status: AC
  Administered 2019-03-04 – 2019-03-09 (×19): 3 g via INTRAVENOUS
  Filled 2019-03-04 (×23): qty 8

## 2019-03-04 MED ORDER — VITAL HIGH PROTEIN PO LIQD
1000.0000 mL | ORAL | Status: DC
  Administered 2019-03-04: 10:00:00 1000 mL

## 2019-03-04 MED ORDER — INSULIN DETEMIR 100 UNIT/ML ~~LOC~~ SOLN
20.0000 [IU] | Freq: Two times a day (BID) | SUBCUTANEOUS | Status: DC
  Filled 2019-03-04: qty 0.2

## 2019-03-04 MED ORDER — CLONAZEPAM 0.5 MG PO TABS: 0.2500 mg | ORAL_TABLET | Freq: Three times a day (TID) | ORAL | Status: DC

## 2019-03-04 MED ORDER — METOCLOPRAMIDE HCL 5 MG/ML IJ SOLN
10.0000 mg | Freq: Four times a day (QID) | INTRAMUSCULAR | Status: AC
  Administered 2019-03-04 – 2019-03-06 (×7): 10 mg via INTRAVENOUS
  Filled 2019-03-04 (×7): qty 2

## 2019-03-04 MED ORDER — INSULIN DETEMIR 100 UNIT/ML ~~LOC~~ SOLN
20.0000 [IU] | Freq: Two times a day (BID) | SUBCUTANEOUS | Status: DC
  Administered 2019-03-04 (×2): 20 [IU] via SUBCUTANEOUS
  Filled 2019-03-04 (×4): qty 0.2

## 2019-03-04 MED ORDER — VITAL 1.5 CAL PO LIQD
1000.0000 mL | ORAL | Status: DC
  Administered 2019-03-04 – 2019-03-09 (×4): 1000 mL
  Filled 2019-03-04 (×8): qty 1000

## 2019-03-04 MED ORDER — CLONAZEPAM 0.25 MG PO TBDP
0.2500 mg | ORAL_TABLET | Freq: Three times a day (TID) | ORAL | Status: DC
  Administered 2019-03-04 (×2): 0.25 mg via ORAL
  Filled 2019-03-04 (×2): qty 1

## 2019-03-04 NOTE — Progress Notes (Signed)
SLP Cancellation Note  Patient Details Name: Andrew Bruce MRN: [id number] DOB: November 19, 1965   Cancelled treatment:         Events of this morning noted. Not ready for inline PMV assessment at this time. Will continue to follow.   Royce Macadamia 03/04/2019, 8:07 AM   Breck Coons Lonell Face.Ed Nurse, children's [phone] Office [phone]

## 2019-03-04 NOTE — Progress Notes (Addendum)
Nutrition Follow-up / Consult  DOCUMENTATION CODES:   Not applicable  INTERVENTION:   Resume tube feeding via Cortrak:   Vital 1.5 at 20 ml/h, when able, increase to goal rate of 60 ml/hr Pro-Stat 30 mL TID TF regimen at goal provides 2460 kcals, 142 g of protein and 1100 mL of free water  Continue Juven BID, each packet provides 80 calories, 8 grams of carbohydrate, 2.5  grams of protein (collagen), 7 grams of L-arginine and 7 grams of L-glutamine; supplement contains CaHMB, Vitamins C, E, B12 and Zinc to promote wound healing  NUTRITION DIAGNOSIS:   Inadequate oral intake related to acute illness as evidenced by NPO status.  Being addressed via TF   GOAL:   Patient will meet greater than or equal to 90% of their needs  Progressing  MONITOR:   TF tolerance, Vent status, Labs, Weight trends  ASSESSMENT:   54 yo male admitted with acute respiratory failure due to COVID-19 pneumonia, ARDS requiring intubation, shock. No PMH   1/23 Admit 1/24 Intubated 1/25 B/L chest tubes placed 2/02 CT head: multiple ischemic infarcts  2/16 S/P tracheostomy and Cortrak placement (tip in the stomach)   Patient is off respiratory isolation. Ongoing gangrene of the toes.   SLP following for ability to begin PMV assessment.   S/P vomiting episode this AM. TF held since 3am.  CT abdomen showed no ileus. CT chest also completed. Reglan added. Received MD consult to resume trickle TF.  Patient remains on ventilator support via trach. MV: 11.9 L/min Temp (24hrs), Avg:100.4 F (38 C), Min:98.2 F (36.8 C), Max:103.1 F (39.5 C)  Propofol is off.  Utilizing 76 kg as EDW  Intake/Output Summary (Last 24 hours) at 03/04/2019 1109 Last data filed at 03/04/2019 1000 Gross per 24 hour  Intake 1177.93 ml  Output 3440 ml  Net -2262.07 ml    Labs reviewed. CBGs 170-151 Meds: MVI, Vit C, zinc sulfate, folic acid, ss novolog, novolog 2 U q 4 hours, levemir, Juven, Miralax,  Senokot-S, thiamine, Reglan.   Diet Order:   Diet Order            Diet NPO time specified  Diet effective midnight              EDUCATION NEEDS:   Not appropriate for education at this time  Skin:  Skin Assessment: Skin Integrity Issues: Skin Integrity Issues:: Stage I, Stage II DTI: coccyx Stage I: R ear Stage II: L ear Other: gangrene of toes  Last BM:  2/16  Height:   Ht Readings from Last 1 Encounters:  02/06/19 5\' 6"  (1.676 m)    Weight:   Wt Readings from Last 1 Encounters:  03/03/19 82.8 kg    BMI:  Body mass index is 29.46 kg/m.  Estimated Nutritional Needs:   Kcal:  03/05/19 kcals  Protein:  120-145 g  Fluid:  >/= 2 L   [id number], RD, LDN, CNSC Please refer to Amion for contact information.

## 2019-03-04 NOTE — Progress Notes (Signed)
RT note-Sputum taken to lab.

## 2019-03-04 NOTE — Progress Notes (Addendum)
RT note-May change VT and RR rate to VE needs per Dr. Katrinka Blazing. Patient is weaning well at this time, continue to monitor.

## 2019-03-04 NOTE — Progress Notes (Signed)
Inpatient Diabetes Program Recommendations  AACE/ADA: New Consensus Statement on Inpatient Glycemic Control (2015)  Target Ranges:  Prepandial:   less than 140 mg/dL      Peak postprandial:   less than 180 mg/dL (1-2 hours)      Critically ill patients:  140 - 180 mg/dL   Lab Results  Component Value Date   GLUCAP 151 (H) 03/04/2019   HGBA1C 11.7 (H) 02/17/2019    Review of Glycemic Control Results for ANUJ, SUMMONS (MRN [id number]) as of 03/04/2019 11:35  Ref. Range 03/04/2019 04:54 03/04/2019 06:57 03/04/2019 08:00  Glucose-Capillary Latest Ref Range: 70 - 99 mg/dL 419 (H) 379 (H) 024 (H)   Diabetes history: New onset Outpatient Diabetes medications: none Current orders for Inpatient glycemic control: Novolog 2 units Q4H, Levemir 40 units BID, Novolog 0-20 units Q4H  Inpatient Diabetes Program Recommendations:   Noted hypoglycemia yesterday. Today patient waning and tube feeds decreased.  Consider decreasing Levemir to 20 units BID.   Thanks, Lujean Rave, MSN, RNC-OB Diabetes Coordinator [phone] (8a-5p)

## 2019-03-04 NOTE — Care Management (Signed)
Pt does not have LTACH benefits.

## 2019-03-04 NOTE — Progress Notes (Signed)
eLink Physician-Brief Progress Note Patient Name: Andrew Bruce DOB: 1965/09/18 MRN: [id number]   Date of Service  03/04/2019  HPI/Events of Note  Pt vomited then desaturated r/o aspiration  eICU Interventions  Hold enteral nutrition, stat portable CXR and KUB to r/o aspiration and ileus respectively.        Thomasene Lot [name] 03/04/2019, 4:37 AM

## 2019-03-04 NOTE — Progress Notes (Signed)
NAME:  Andrew Bruce, MRN:  [id number], DOB:  [birth year], LOS: 26 ADMISSION DATE:  02/06/2019, CONSULTATION DATE:  02/06/19 REFERRING MD:  ER, CHIEF COMPLAINT:  SOB   Brief History   [name] without a past medical history admitted 1/23 with 48 hours of worsening shortness of breath in the context of a 10-day Covid illness (dry cough, lost taste/smell).  Chest x-ray and vitals are consistent with a worsening COVID ARDS.  Admitted for observation.  Developed worsening shortness of breath and increasing O2 needs. Decompensated requiring intubation 1/24.  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   Past Medical History  None known prior to admit   Significant Hospital Events   1/23 Admit  1/24 Decompensated, tx to ICU, intubated 1/25 b/l chest tubes placed 1/25 CVC  2/3 - flomax started  2/6 - rt chest tube connection changed  2/9 - Still no BM. Afebrile.  Left chest tube connection changed  2/10 -  Palliative crae meeting  -> full code, full aggressive care including trach., lact. 40% fio2, 10 peep.  Currently on propofol and fentanyl infusion.  Not on pressors.  On tube feeds.  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 afbrile snce 2/7. TGL improved to 161  2/16 Trach  2/17 chest tubes removed  2/18 witnessed aspiration   Consults:  Neurology  Palliative care  Procedures:  ETT 1/24 >> Bilateral chest tube placement 1/25 >> R Femoral TLC 1/25 >>  RUE PICC  Significant Diagnostic Tests:  CT Head 2/2 >> no acute intracranial hemorrhage, large area of infarct in the left occipital lobe, likely subacute  CTA Chest 2/3 >> LLL segmental PE, no CT evidence of right heart strain, multifocal PNA, background emphysema and probable degree of interstitial fibrosis  ECHO 2/3 >> LVEF 55-60%, no WMA, hypokinetic right ventricular free wall, McConnells sign present.  RV findings consistent with acute cor  pulmonale, small pericardial effusion, mildly elevated pulmonary artery systolic pressure  Micro Data:  COVID 1/12 >> positive  PCT 1/23 >> Neg 2/5 blood cx >> negative 2/6 sputum cx>> abundant diphtheroids Corynebacterium species   Antimicrobials:  Remdesivir 02/06/19>>1/28 Dexamethasone 02/06/19>>2/2  xxxxxxxxxxxxxx vanc 2/5>> (2/12 - diphtheroids_ Zosyn 2/5>> 2/9, Cefepime 2/9 >>2/11)   Interim history/subjective:  Remains poorly responsive on vent. Aspiration event this AM and now febrile. Very high UoP.  Objective   Blood pressure 137/76, pulse (!) 119, temperature (!) 101.2 F (38.4 C), temperature source Oral, resp. rate 18, height 5\' 6"  (1.676 m), weight 82.8 kg, SpO2 96 %.    Vent Mode: PSV;CPAP FiO2 (%):  [40 %-60 %] 60 % Set Rate:  [30 bmp] 30 bmp Vt Set:  [380 mL] 380 mL PEEP:  [5 cmH20-8 cmH20] 8 cmH20 Pressure Support:  [8 cmH20-12 cmH20] 12 cmH20 Plateau Pressure:  [22 cmH20] 22 cmH20   Intake/Output Summary (Last 24 hours) at 03/04/2019 03/06/2019 Last data filed at 03/04/2019 0800 Gross per 24 hour  Intake 1545.43 ml  Output 4390 ml  Net -2844.57 ml   Filed Weights   03/01/19 0453 03/02/19 0424 03/03/19 0446  Weight: 82 kg 83.4 kg 82.8 kg   GEN: ill appearing man on vent HEENT: trach in place, copious purulent secretions CV: tachycardic, ext warm PULM: scattered rhonci, +accessory muscle use GI: Soft, hypoactive BS EXT: No edema NEURO: GCS 3 for me PSYCH: RASS -5 SKIN: ischemic toes bilaterally   Resolved Hospital Problem list   Oliguria, hypernatremia Bilateral  pneumothoraces Ventilator associated pneumonia  Assessment & Plan:  Acute hypoxemic respiratory failure due to COVID-19 pneumonia, ARDS, PE, left lower lobe pneumonia -Holding all sedation this AM except klonipin, which has been dropped drastically -PS trial today  Pulmonary embolism -Continue Lovenox  Aortic clot -Continue Lovenox -Follow areas of skin injury -Ischemic foot  changes, gangrenous changes  Acute right heart failure- diuresed well yesterday, f/u AM BMP  Left occipital and right frontal CVA -Supportive measures -Continue Lipitor  COVID-19 -Completed Decadron, remdesivir  Aspiration, fevers- CXR unchanged -CBC/Pct/Aspirate culture, low threshold to start Abx  Steroid-induced hyperglycemia -SSI, Levemir  Anemia critical illness Plan Conservative transfusion, goal Hgb > 7   Best practice:  Diet: Continue tube feeds Pain/Anxiety/Delirium protocol (if indicated): see above VAP protocol (if indicated): Ordered DVT prophylaxis: lovenox Rx dose of 75mg  bid with pharmacy monitoring GI prophylaxis: Pepcid Glucose control: SSI and Levemir Mobility: Bedrest GU - on foley.  Code Status: Full Code   Pall care 02/24/2019 Family Communication: updated wife 2/17 Disposition: ICU  The patient is critically ill with multiple organ systems failure and requires high complexity decision making for assessment and support, frequent evaluation and titration of therapies, application of advanced monitoring technologies and extensive interpretation of multiple databases. Critical Care Time devoted to patient care services described in this note independent of APP/resident time (if applicable)  is 35 minutes.   Erskine Emery MD CCM On-call: [phone]

## 2019-03-04 NOTE — Progress Notes (Signed)
Palliative:  HPI:54 y.o.malewith past medical history of none knownadmitted on 1/23/2021with shortness of breath with COVID ARDS, acute kidney injury, mild transaminitis.Required intubation 1/24 and hospitalization complicated by multiple strokes, bilateral pneumothorax requiring chest tube placement, and aortic clot. He has been unable to wean to extubatewithpoor neurological status.Overall prognosis is poor.   I met again today at Andrew Bruce bedside but no family present. He has fever 103 F and is now off of sedation for a few hours. His eyes open slightly but never fully and does not track, turn towards voice, follow commands, or make any purposeful or non-purposeful movements.   I called and spoke with daughter, Andrew Bruce. I explained my above assessment as well as new fever. I expressed my concern for poor neurological exam even completely off sedation. I further explained that he is high risk for recurrent infection and skin breakdown and further health complications in his current state. We discussed that over time this will continue to lead to further decline. With continued poor neurological response and bed-bound status his quality of life is expected to be extremely poor.   Andrew Bruce will speak with her family about this discussion. We discussed that we should consider having another family meeting over the next few days to reassess goals of care. Andrew Bruce agrees this could be helpful and would like to be contacted the beginning of next week to plan. I will be off but will be happy to meet with them again on Wednesday 03/10/19 or a colleague can meet them prior to this date if desired.   All questions/concerns addressed. Emotional support provided. Discussed with Dr. Tamala Julian.   Exam: Flutters eyes slightly. Does not follow any commands. No movement. No distress. Tolerating vent with sedation and no vent dyssynchrony. Abd flat, soft. Bilateral ischemic changes to toes.   Plan: - Will  try to arrange family meeting for early next week.  - No palliative follow up over the weekend unless called.   25 min  Vinie Sill, NP Palliative Medicine Team Pager [phone] (Please see [email or web address] for schedule) Team Phone [phone]    Greater than 50%  of this time was spent counseling and coordinating care related to the above assessment and plan

## 2019-03-05 ENCOUNTER — Inpatient Hospital Stay (HOSPITAL_COMMUNITY): Payer: HRSA Program

## 2019-03-05 LAB — CBC
HCT: 26.8 % — ABNORMAL LOW (ref 39.0–52.0)
Hemoglobin: 8.2 g/dL — ABNORMAL LOW (ref 13.0–17.0)
MCH: 26.9 pg (ref 26.0–34.0)
MCHC: 30.6 g/dL (ref 30.0–36.0)
MCV: 87.9 fL (ref 80.0–100.0)
Platelets: 668 10*3/uL — ABNORMAL HIGH (ref 150–400)
RBC: 3.05 MIL/uL — ABNORMAL LOW (ref 4.22–5.81)
RDW: 14.4 % (ref 11.5–15.5)
WBC: 15.6 10*3/uL — ABNORMAL HIGH (ref 4.0–10.5)
nRBC: 0 % (ref 0.0–0.2)

## 2019-03-05 LAB — BASIC METABOLIC PANEL
Anion gap: 11 (ref 5–15)
BUN: 19 mg/dL (ref 6–20)
CO2: 27 mmol/L (ref 22–32)
Calcium: 8.3 mg/dL — ABNORMAL LOW (ref 8.9–10.3)
Chloride: 102 mmol/L (ref 98–111)
Creatinine, Ser: 0.42 mg/dL — ABNORMAL LOW (ref 0.61–1.24)
GFR calc Af Amer: >60 mL/min (ref >60)
GFR calc non Af Amer: >60 mL/min (ref >60)
Glucose, Bld: 84 mg/dL (ref 70–99)
Potassium: 2.8 mmol/L — ABNORMAL LOW (ref 3.5–5.1)
Sodium: 140 mmol/L (ref 135–145)

## 2019-03-05 LAB — GLUCOSE, CAPILLARY
Glucose-Capillary: 87 mg/dL (ref 70–99)
Glucose-Capillary: 74 mg/dL (ref 70–99)
Glucose-Capillary: 106 mg/dL — ABNORMAL HIGH (ref 70–99)
Glucose-Capillary: 156 mg/dL — ABNORMAL HIGH (ref 70–99)
Glucose-Capillary: 118 mg/dL — ABNORMAL HIGH (ref 70–99)

## 2019-03-05 MED ORDER — FENTANYL CITRATE (PF) 100 MCG/2ML IJ SOLN
12.5000 ug | INTRAMUSCULAR | Status: DC | PRN
  Administered 2019-03-06 – 2019-03-09 (×6): 25 ug via INTRAVENOUS
  Filled 2019-03-05 (×6): qty 2

## 2019-03-05 MED ORDER — MIDAZOLAM HCL 2 MG/2ML IJ SOLN
1.0000 mg | INTRAMUSCULAR | Status: DC | PRN
  Administered 2019-03-08: 1 mg via INTRAVENOUS
  Filled 2019-03-05: qty 2

## 2019-03-05 MED ORDER — OXYCODONE HCL 5 MG PO TABS
5.0000 mg | ORAL_TABLET | Freq: Three times a day (TID) | ORAL | Status: DC
  Administered 2019-03-05 – 2019-03-06 (×3): 5 mg
  Filled 2019-03-05 (×3): qty 1

## 2019-03-05 MED ORDER — POTASSIUM CHLORIDE 20 MEQ/15ML (10%) PO SOLN
40.0000 meq | Freq: Two times a day (BID) | ORAL | Status: AC
  Administered 2019-03-05 (×2): 40 meq
  Filled 2019-03-05 (×2): qty 30

## 2019-03-05 MED ORDER — POTASSIUM CHLORIDE CRYS ER 20 MEQ PO TBCR: 40.0000 meq | EXTENDED_RELEASE_TABLET | Freq: Two times a day (BID) | ORAL | Status: DC

## 2019-03-05 MED ORDER — INSULIN DETEMIR 100 UNIT/ML ~~LOC~~ SOLN
12.0000 [IU] | Freq: Two times a day (BID) | SUBCUTANEOUS | Status: DC
  Administered 2019-03-05 – 2019-03-13 (×11): 12 [IU] via SUBCUTANEOUS
  Filled 2019-03-05 (×17): qty 0.12

## 2019-03-05 MED ORDER — FUROSEMIDE 10 MG/ML IJ SOLN
40.0000 mg | Freq: Once | INTRAMUSCULAR | Status: AC
  Administered 2019-03-05: 13:00:00 40 mg via INTRAVENOUS
  Filled 2019-03-05: qty 4

## 2019-03-05 MED ORDER — DEXTROSE 10 % IV SOLN: INTRAVENOUS | Status: DC

## 2019-03-05 MED ORDER — QUETIAPINE FUMARATE 50 MG PO TABS
25.0000 mg | ORAL_TABLET | Freq: Two times a day (BID) | ORAL | Status: DC
  Administered 2019-03-05 – 2019-03-06 (×4): 25 mg
  Filled 2019-03-05 (×4): qty 1

## 2019-03-05 NOTE — Progress Notes (Signed)
Upon instilling 2200 medications, noted that some fluid was coming out of patients mouth at same time.  Patient began mild cough.  Placed yankour deep in patients mouth and everything I instilled from the tumi syringe came out via yankour.  I then checked for placement and although I heard air in belly, I also heard air escap[ing from mouth.  Verified these findings with two of my colleagues and removed the cortrak so that patient would not aspirate. Will have new cortrac placed on Friday, tomorrow.

## 2019-03-05 NOTE — Procedures (Signed)
Cortrak  Person Inserting Tube:  Khloee Garza, RD Tube Type:  Cortrak - 43 inches Tube Location:  Left nare Initial Placement:  Stomach Secured by: Bridle Technique Used to Measure Tube Placement:  Documented cm marking at nare/ corner of mouth Cortrak Secured At:  71 cm   No x-ray is required. RN may begin using tube.   If the tube becomes dislodged please keep the tube and contact the Cortrak team at [email or web address] ([email or web address]) for replacement.  If after hours and replacement cannot be delayed, place a NG tube and confirm placement with an abdominal x-ray.    Rether Rison RD, LDN Clinical Nutrition Pager listed in AMION     

## 2019-03-05 NOTE — Progress Notes (Signed)
eLink Physician-Brief Progress Note Patient Name: Andrew Bruce DOB: [birth year] MRN: [id number]   Date of Service  03/05/2019  HPI/Events of Note  Patient's Cortrack feeding tube became dislodged and had to be removed by bedside RN.  eICU Interventions  Order entered for re-insertion of Cortrack feeding tube.        Thomasene Lot Salle Brandle 03/05/2019, 5:20 AM

## 2019-03-05 NOTE — Progress Notes (Signed)
OT Cancellation Note  Patient Details Name: Andrew Bruce MRN: [id number] DOB: 1965/02/18   Cancelled Treatment:    Reason Eval/Treat Not Completed: Medical issues which prohibited therapy. Pt unresponsive, not appropriate for OT at this time. Please reorder as appropriate.  Evern Bio 03/05/2019, 2:35 PM  Martie Round, OTR/L Acute Rehabilitation Services Pager: [phone] Office: [phone]

## 2019-03-05 NOTE — Progress Notes (Signed)
Patient placed back on full support for transport to CT. Patient placed back on wean at 1221. CPAP/PS 12/5 50%. Patient tolerating well at this time. RT will continue to monitor.

## 2019-03-05 NOTE — Consult Note (Signed)
Chief Complaint: Patient was seen in consultation today for gastrostomy placement.  Referring Physician(s): Canary Brim  Supervising Physician: Ruel Favors  Patient Status: Andrew Bruce - In-pt  History of Present Illness: Andrew Bruce is a 54 y.o. male with no significant prior medical history who presented to the ED on 02/06/19 with complaints of worsening dyspnea after being diagnosed with Covid-19 on 02/04/19. Upon EMS arrival he was found to be significantly hypoxic on room air (36%) and was started on nonrebreather which improved his sats to high 80s/low 90s. Initial workup concerning for Covid ARDS. He was admitted to the ICU for further management ultimately requiring intubation/ventilation on 1/24, central venous catheter placement 1/25, bilateral chest tube placement for pneumothoraces/SQ emphysema 1/25 (removed 2/17), bronchoscopy and tracheostomy placement 2/16. 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 He has a cortrak in place for nutrition with a witnessed aspiration event on 2/18 and removal/replacement of cortrak on 2/19. IR has been consulted for percutaneous gastrostomy placement for long term nutrition.  History reviewed. No pertinent past medical history.  History reviewed. No pertinent surgical history.  Allergies: Patient has no known allergies.  Medications: Prior to Admission medications   Not on File     No family history on file.  Social History   Socioeconomic History  . Marital status: Married    Spouse name: Not on file  . Number of children: Not on file  . Years of education: Not on file  . Highest education level: Not on file  Occupational History  . Not on file  Tobacco Use  . Smoking status: Never Smoker  . Smokeless tobacco: Never Used  Substance and Sexual Activity  . Alcohol use: Yes  . Drug use: Never  . Sexual activity: Not on file  Other Topics Concern  . Not on  file  Social History Narrative  . Not on file   Social Determinants of Health   Financial Resource Strain:   . Difficulty of Paying Living Expenses: Not on file  Food Insecurity:   . Worried About Programme researcher, broadcasting/film/video in the Last Year: Not on file  . Ran Out of Food in the Last Year: Not on file  Transportation Needs:   . Lack of Transportation (Medical): Not on file  . Lack of Transportation (Non-Medical): Not on file  Physical Activity:   . Days of Exercise per Week: Not on file  . Minutes of Exercise per Session: Not on file  Stress:   . Feeling of Stress : Not on file  Social Connections:   . Frequency of Communication with Friends and Family: Not on file  . Frequency of Social Gatherings with Friends and Family: Not on file  . Attends Religious Services: Not on file  . Active Member of Clubs or Organizations: Not on file  . Attends Banker Meetings: Not on file  . Marital Status: Not on file     Review of Systems: A 12 point ROS discussed and pertinent positives are indicated in the HPI above.  All other systems are negative.  Review of Systems  Unable to perform ROS: Patient unresponsive    Vital Signs: BP 133/89   Pulse (!) 118   Temp 99.7 F (37.6 C) (Axillary)   Resp (!) 23   Ht 5\' 6"  (1.676 m)   Wt 182 lb 15.7 oz (83 kg)   SpO2 97%   BMI 29.53 kg/m   Physical Exam Vitals  and nursing note reviewed.  Constitutional:      Appearance: He is ill-appearing.     Comments: Patient sedated, ventilated. No family or staff at bedside during my exam.  HENT:     Head: Normocephalic.  Cardiovascular:     Rate and Rhythm: Regular rhythm. Tachycardia present.  Pulmonary:     Comments: (+) tracheostomy, ventilated. Decreased breath sounds bilaterally although anterior exam only Abdominal:     General: There is no distension.     Palpations: Abdomen is soft.  Skin:    General: Skin is warm and dry.      MD Evaluation Airway: Other  (comments) Airway comments: Tracheostomy Heart: WNL Abdomen: WNL((+) cortrak) Chest/ Lungs: (Ventilated) ASA  Classification: 3 Mallampati/Airway Score: (Tracheostomy)   Imaging: CT ABDOMEN WO CONTRAST  Result Date: 03/05/2019 CLINICAL DATA:  Evaluate anatomy for percutaneous gastrostomy tube placement. EXAM: CT ABDOMEN WITHOUT CONTRAST TECHNIQUE: Multidetector CT imaging of the abdomen was performed following the standard protocol without IV contrast. COMPARISON:  Chest CT 02/25/2019 FINDINGS: Lower chest: Again noted is severe consolidation in the lower lobes, right side greater than left. Extensive air bronchograms in both lower lobes. Patchy parenchymal densities in lower lungs bilaterally. Right chest tube has been removed since the previous examination. No large pneumothorax. Hepatobiliary: High-density material in the gallbladder without inflammatory changes. Normal appearance of the liver. Pancreas: Unremarkable. No pancreatic ductal dilatation or surrounding inflammatory changes. Spleen: Normal in size without focal abnormality. Adrenals/Urinary Tract: Normal adrenal glands. Mild fullness of the right renal pelvis without hydronephrosis. Normal appearance of left kidney. No suspicious renal lesions. Stomach/Bowel: Normal appearance of the stomach. The colon is caudal to the stomach. There is no bowel intervening between the stomach and anterior abdominal wall. No evidence for bowel obstruction or inflammatory changes. Vascular/Lymphatic: Atherosclerotic calcifications in the abdominal aorta without aneurysm. No significant lymphadenopathy in the abdomen. Other: There appears to be a small periumbilical hernia containing fat. Negative for ascites. Musculoskeletal: No acute bone abnormality. IMPRESSION: 1. Anatomy should be amendable for percutaneous gastrostomy tube placement. 2. Extensive consolidation and airspace disease in the lungs. Findings are similar to the prior chest CT. 3. High-density  material in the gallbladder, findings may represent sludge. Electronically Signed   By: Richarda Overlie M.D.   On: 03/05/2019 12:36   DG Chest 1 View  Result Date: 03/03/2019 CLINICAL DATA:  54 year old male presenting for evaluation of pneumothorax. EXAM: CHEST  1 VIEW COMPARISON:  Chest radiograph dated 03/02/2019. FINDINGS: Tracheostomy in similar position. A feeding tube extends below the diaphragm with tip beyond the inferior margin of the image. Right-sided PICC with tip close to the cavoatrial junction. Bilateral chest tubes appear in similar position as well. No pneumothorax identified. No significant change in the appearance of the lungs and bilateral airspace densities. Stable cardiomediastinal silhouette. No acute osseous pathology. IMPRESSION: No pneumothorax identified.  No significant interval change. Electronically Signed   By: Elgie Collard M.D.   On: 03/03/2019 17:23   DG Chest 1 View  Result Date: 02/11/2019 CLINICAL DATA:  COVID-19, follow-up pneumothorax EXAM: CHEST  1 VIEW COMPARISON:  Chest radiograph from one day prior. FINDINGS: Endotracheal tube tip is 4.3 cm above the carina. Enteric tube enters stomach with the tip not seen on this image. Stable bilateral chest tube positions. Stable cardiomediastinal silhouette with normal heart size. Tiny residual medial left pneumothorax is decreased. No right pneumothorax. No pleural effusion. Patchy hazy opacities throughout both lungs appear similar. IMPRESSION: 1. Tiny residual medial left  pneumothorax, decreased. No right pneumothorax. Stable well-positioned support structures. 2. Stable patchy hazy opacities in both lungs. Electronically Signed   By: Ilona Sorrel M.D.   On: 02/11/2019 09:50   DG Chest 1 View  Result Date: 02/09/2019 CLINICAL DATA:  Pneumothorax, tube flushed EXAM: CHEST  1 VIEW COMPARISON:  Earlier same day FINDINGS: Bilateral chest tubes, endotracheal tube, and enteric tube are again present. Possible small residual  left pneumothorax. No right pneumothorax. Persistent bilateral opacities. Stable cardiomediastinal contours. Bilateral chest wall emphysema. IMPRESSION: Possible small residual left pneumothorax.  Stable lung aeration. Electronically Signed   By: Macy Mis M.D.   On: 02/09/2019 11:11   DG Chest 1 View  Result Date: 02/08/2019 CLINICAL DATA:  Respiratory distress, concern for pneumothorax. EXAM: CHEST  1 VIEW COMPARISON:  02/07/2019. FINDINGS: Endotracheal tube terminates 4 cm above the carina. Nasogastric tube is followed into the stomach with the tip projecting beyond the inferior margin of the image. Suspect pneumomediastinum. Heart size stable. There has been interval improvement in patchy bilateral airspace opacification, without complete resolution. There is a small to moderate right pneumothorax and a small apical left pneumothorax. Extensive subcutaneous emphysema throughout the visualized chest and neck. Tiny left pleural effusion. IMPRESSION: 1. Bilateral pneumothoraces, small to moderate in size on the right and small on the left. Probable associated pneumomediastinum. Extensive subcutaneous emphysema. Critical Value/emergent results were called by telephone at the time of interpretation on 02/08/2019 at 12:42 pm to providerROBERT BYRUM, who verbally acknowledged these results. 2. Improving patchy bilateral airspace opacification, most indicative of COVID-19 pneumonia. Electronically Signed   By: Lorin Picket M.D.   On: 02/08/2019 12:43   DG Abd 1 View  Result Date: 03/04/2019 CLINICAL DATA:  Ileus. EXAM: ABDOMEN - 1 VIEW COMPARISON:  02/22/2019 FINDINGS: Air within nondilated small bowel in the central abdomen. No increased small bowel gas to suggest ileus or obstruction. Small to moderate colonic stool burden. No evidence of free air. No radiopaque calculi. IMPRESSION: No bowel dilatation to suggest ileus or obstruction. Electronically Signed   By: Keith Rake M.D.   On: 03/04/2019  05:38   DG Abd 1 View  Result Date: 02/22/2019 CLINICAL DATA:  55 year old male with constipation, ileus. EXAM: ABDOMEN - 1 VIEW COMPARISON:  Portable abdomen 02/07/2019. Portable chest 02/20/2019. FINDINGS: Enteric tube terminates in the stomach with side hole at the level of the gastric fundus. Non obstructed bowel gas pattern. There is a moderate volume of retained stool from the transverse colon distally. No acute osseous abnormality identified. Negative abdominal visceral contours. IMPRESSION: 1. Enteric tube side hole at the gastric fundus. 2. Non obstructed bowel gas pattern with moderate volume of retained stool. Electronically Signed   By: Genevie Ann M.D.   On: 02/22/2019 06:24   CT HEAD WO CONTRAST  Result Date: 02/16/2019 CLINICAL DATA:  54 year old male with focal neurologic deficit. Recent COVID diagnosis. EXAM: CT HEAD WITHOUT CONTRAST TECHNIQUE: Contiguous axial images were obtained from the base of the skull through the vertex without intravenous contrast. COMPARISON:  None. FINDINGS: Brain: The ventricles and sulci appropriate size for patient's age. There is an area of hypodensity in the left occipital lobe most consistent with edema. A smaller focal area of hypodensity noted in the right frontal convexity (series 3 images 25 and 26). There is a small focus of calcification in the right frontal lobe, likely sequela of prior insult. There is no acute intracranial hemorrhage. No mass effect or midline shift. No extra-axial fluid collection. Vascular:  No hyperdense vessel or unexpected calcification. Skull: Normal. Negative for fracture or focal lesion. Sinuses/Orbits: Partial opacification of the left maxillary sinus. The remainder of the visualized paranasal sinuses are clear. No air-fluid level. There is bilateral mastoid effusions. Other: None IMPRESSION: 1. No acute intracranial hemorrhage. 2. Large area of infarct in the left occipital lobe, likely subacute. Clinical correlation is  recommended. Additional smaller right frontal convexity infarct also noted. MRI may provide better evaluation if clinically indicated. These results were called by telephone at the time of interpretation on 02/16/2019 at 11:22 pm to nurse Brien Mates, who verbally acknowledged these results. Electronically Signed   By: Elgie Collard M.D.   On: 02/16/2019 23:28   CT CHEST WO CONTRAST  Result Date: 02/25/2019 CLINICAL DATA:  Pneumothorax and ARDS EXAM: CT CHEST WITHOUT CONTRAST TECHNIQUE: Multidetector CT imaging of the chest was performed following the standard protocol without IV contrast. COMPARISON:  02/17/2019 chest CT FINDINGS: Cardiovascular: Scattered calcified atherosclerotic changes in the thoracic aorta. No signs of aneurysmal dilation. Heart size is stable with small pericardial fluid. Limited assessment of vascular structures in the chest secondary to lack of intravenous contrast. Known aortic thrombus not well seen select not well seen not seen central pulmonary vasculature is mildly engorged 3.1 cm Mediastinum/Nodes: Endotracheal tube terminates in the trachea approximately 1.2 cm above the carina. Scattered lymph nodes throughout the mediastinum, nonspecific in the setting of acute illness with similar degree of enlargement when compared to the prior exam. Right sided PICC line terminates at the caval to atrial junction as before. Lungs/Pleura: Patchy peripheral ground-glass and septal thickening at the lung apices appears similar perhaps slightly worse compared to the study of 02/17/2019. Marked interval worsening of bibasilar consolidative changes in the lower lobes with near complete collapse of bilateral lower lobes right worse than left. Tiny basilar pneumothorax on the right. Interval development of cystic change along the minor fissure in the right middle lobe (image 103, series 8) there was some consolidation in this area on the previous study. No fluid is noted in this area. Airways are  grossly patent and extensive air bronchograms are noted in the lung bases bilaterally. Bilateral chest tubes remain in place. Upper Abdomen: Incidental imaging of upper abdominal contents shows no acute finding. Musculoskeletal: No signs of acute bone finding or destructive bone process. IMPRESSION: 1. Marked interval worsening of bibasilar consolidative changes with near complete collapse of bilateral lower lobes right worse than left. 2. Interval development of cystic change along the minor fissure in the right middle lobe. No fluid is noted in this area, this may represent developing post infectious pneumatocele. Attention on follow-up. 3. Bilateral chest tubes remain in place. Tiny right basilar pneumothorax. 4. 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 5. Endotracheal tube approximately 1-1.5 cm above the carina. These results will be called to the ordering clinician or representative by the Radiologist Assistant, and communication documented in the PACS or zVision Dashboard. Aortic Atherosclerosis (ICD10-I70.0). Electronically Signed   By: Donzetta Kohut M.D.   On: 02/25/2019 14:55   CT ANGIO CHEST PE W OR WO CONTRAST  Result Date: 02/17/2019 CLINICAL DATA:  54 year old male with concern for pulmonary embolism. EXAM: CT ANGIOGRAPHY CHEST WITH CONTRAST TECHNIQUE: Multidetector CT imaging of the chest was performed using the standard protocol during bolus administration of intravenous contrast. Multiplanar CT image reconstructions and MIPs were obtained to evaluate the vascular anatomy. CONTRAST:  OMNIPAQUE IOHEXOL 350 MG/ML SOLN COMPARISON:  Chest radiograph  dated 02/17/2019. FINDINGS: Cardiovascular: There is no cardiomegaly. There is trace pericardial effusion. Mild coronary vascular calcification of the LAD. There is mild atherosclerotic calcification of the aorta with calcified and noncalcified plaque of the aortic arch. No aneurysmal dilatation or dissection. There is a  pulmonary artery embolism in the left lower lobe segmental branch. No other pulmonary artery embolus identified. There is no CT evidence of right heart straining. Mediastinum/Nodes: Mildly enlarged right hilar lymph nodes measuring up to 16 mm in short axis, likely reactive. An enteric tube is noted within the esophagus. No mediastinal fluid collection. Lungs/Pleura: Background of emphysema and diffuse interstitial coarsening/scarring. Bibasilar and subpleural interstitial coarsening, likely related to fibrosis. Bilateral lower lobe/bibasilar consolidative changes with air bronchograms may represent pneumonia. There is mild lower lobe predominant bronchiectatic changes. Scattered bilateral ground-glass airspace opacities most consistent with pneumonia, possibly viral or atypical. Bilateral chest tubes with tip along the posterior pleural surfaces noted. There is no pleural effusion or pneumothorax. The central airways are patent. Upper Abdomen: Enteric tube extends into the body of the stomach. Musculoskeletal: No acute osseous pathology. Review of the MIP images confirms the above findings. IMPRESSION: 1. Left lower lobe segmental pulmonary artery embolus. No CT evidence of right heart straining. 2. Multifocal pneumonia. 3. Background of emphysema and probable degree of interstitial fibrosis. 4.  Aortic Atherosclerosis (ICD10-I70.0). These results were called by telephone at the time of interpretation on 02/17/2019 at 7:15 pm to nurse [facility], who verbally acknowledged these results. Electronically Signed   By: Elgie Collard M.D.   On: 02/17/2019 19:22   DG Chest Port 1 View  Result Date: 03/04/2019 CLINICAL DATA:  Acute respiratory failure. EXAM: PORTABLE CHEST 1 VIEW COMPARISON:  Radiograph yesterday. CT 02/25/2019 FINDINGS: Tracheostomy tube tip at the thoracic inlet. Weighted enteric tube tip below the diaphragm not included in the field of view. Right upper extremity PICC tip at the atrial caval  junction. Stable lung volumes. Unchanged bilateral reticular opacities. More confluent bibasilar opacities have improved. Unchanged heart size and mediastinal contours. No pneumothorax. IMPRESSION: 1. Persistent heterogeneous reticular opacities with improvement in more confluent opacities at the lung bases. 2. Stable support apparatus. Electronically Signed   By: Narda Rutherford M.D.   On: 03/04/2019 05:36   DG Chest Port 1 View  Result Date: 03/02/2019 CLINICAL DATA:  Status post tracheostomy EXAM: PORTABLE CHEST 1 VIEW COMPARISON:  03/01/2019 FINDINGS: Interval tracheostomy, with appropriate positioning of tracheostomy appliance. Otherwise unchanged AP portable examination with bilateral chest tubes and no significant pneumothorax. Bibasilar heterogeneous and interstitial airspace opacity and small bilateral pleural effusions. The heart and mediastinum are unremarkable. Partially imaged enteric feeding tube. IMPRESSION: 1. Interval tracheostomy with appropriate radiographic position of tracheostomy appliance. 2. Otherwise unchanged AP portable examination with bilateral chest tubes and no significant pneumothorax. 3. Bibasilar heterogeneous and interstitial airspace opacity and small bilateral pleural effusions. Electronically Signed   By: Lauralyn Primes M.D.   On: 03/02/2019 16:34   DG Chest Port 1 View  Result Date: 03/01/2019 CLINICAL DATA:  Acute respiratory failure EXAM: PORTABLE CHEST 1 VIEW COMPARISON:  Three days ago FINDINGS: Endotracheal tube tip is at the clavicular heads. The enteric tube reaches the stomach. Right PICC with tip at the upper cavoatrial junction. Bilateral chest tube in stable position. Stable bilateral pulmonary infiltrate. Lung volumes are low. No visible effusion or pneumothorax. Normal heart size. IMPRESSION: 1. Unremarkable hardware positioning. 2. Stable low volume chest with bilateral opacification. 3. No visible pneumothorax. Electronically Signed   By:  Marnee SpringJonathon  Watts  M.D.   On: 03/01/2019 05:59   DG CHEST PORT 1 VIEW  Result Date: 02/26/2019 CLINICAL DATA:  Intubation, recent COVID EXAM: PORTABLE CHEST 1 VIEW COMPARISON:  CT chest 02/25/2019, radiograph 02/25/2019 FINDINGS: *Endotracheal tube terminates in the lower trachea, 4.1 cm from the carina. *Bilateral chest tubes remain in place. *A transesophageal tube tip and side port distal to the GE junction. *Right upper extremity PICC tip terminates at the right atrium. *Telemetry leads overlie the chest. Increasing density of the bilateral interstitial and airspace opacity most pronounced in the lung bases may be related to diminished lung volume/worsening atelectasis. No visible residual medial right pneumothorax. Suspect trace effusions. Cardiomediastinal contours are stable from prior. IMPRESSION: 1. Increasing density of bilateral interstitial and airspace opacities most pronounced in the lung bases some of which may be related to diminished lung volume/worsening atelectasis. 2. No residual visible right pneumothorax. 3. Tubes and lines as above. Electronically Signed   By: Kreg ShropshirePrice  DeHay M.D.   On: 02/26/2019 06:35   DG CHEST PORT 1 VIEW  Result Date: 02/25/2019 CLINICAL DATA:  Intubation EXAM: PORTABLE CHEST 1 VIEW COMPARISON:  Radiograph 02/23/2019, CT 02/17/2019 FINDINGS: *Endotracheal tube terminates 4 cm from the carina. *Transesophageal tube tip and side port below the GE junction, beyond the level of imaging. *Bilateral chest tubes are present. *Right upper extremity PICC tip terminates at the superior cavoatrial junction Persistent mixed interstitial and patchy airspace opacities present in both lungs with slightly diminished lung volumes. There is new linear medial lucency in the right lung base which is indeterminate, could reflect right pleural gas along the posterior junction line versus air within the esophagus or other mediastinal gas. No other suspicious air lucencies. Suspect layering bilateral  effusions, right greater than left. Otherwise stable mediastinal contours. No acute osseous or soft tissue abnormality. IMPRESSION: 1. New linear lucency in the medial right lung base is indeterminate, possibly medial pneumothorax versus mediastinal gas. 2. Stable bilateral airspace disease. 3. Lines and tubes as above. Electronically Signed   By: Kreg ShropshirePrice  DeHay M.D.   On: 02/25/2019 06:00   DG CHEST PORT 1 VIEW  Result Date: 02/23/2019 CLINICAL DATA:  Endotracheal tube. Respiratory distress. COVID pneumonia EXAM: PORTABLE CHEST 1 VIEW COMPARISON:  02/23/2019 at 533 hours FINDINGS: endotracheal tube repositioned to 2.5 cm from carina. NG tube extends the stomach. RIGHT PICC line unchanged. Bilateral chest tubes in place. No pneumothorax. Dense bibasilar airspace disease. IMPRESSION: 1. Endotracheal tube in good position. 2. Bilateral chest tubes without pneumothorax. 3. Dense bibasilar airspace disease unchanged. Electronically Signed   By: Genevive BiStewart  Edmunds M.D.   On: 02/23/2019 10:53   DG Chest Port 1 View  Result Date: 02/23/2019 CLINICAL DATA:  COVID-19, respiratory distress EXAM: PORTABLE CHEST 1 VIEW COMPARISON:  Radiograph 02/20/2019 FINDINGS: *Endotracheal tube terminates in the upper trachea, 8.6 cm from the carina. *Transesophageal tube tip and side port distal to the GE junction. *Right upper extremity PICC terminates at the superior cavoatrial junction. *Bilateral chest tubes are in place. *Telemetry leads overlie the chest. Persistent mixed interstitial and consolidative airspace opacities throughout both lungs with some increasing opacity towards the lung bases. Cardiomediastinal contours are stable. No visible pneumothorax suspect at least trace right effusion. No left effusion. No acute osseous or soft tissue abnormality. Degenerative changes are present in the imaged spine and shoulders. IMPRESSION: Mixed interstitial and airspace opacities, slightly increasing towards the lung base. Could  reflect with worsening disease, edema or atelectasis. Support devices as above.  Consider advancing the endotracheal tube to the mid trachea (approximately 3 cm). Electronically Signed   By: Kreg ShropshirePrice  DeHay M.D.   On: 02/23/2019 06:09   DG Chest Port 1 View  Result Date: 02/20/2019 CLINICAL DATA:  Respiratory distress. EXAM: PORTABLE CHEST 1 VIEW COMPARISON:  February 19, 2019. FINDINGS: The heart size and mediastinal contours are within normal limits. Endotracheal and nasogastric tubes are unchanged in position. Right-sided PICC line is unchanged. No pneumothorax or significant pleural effusion is noted. Bilateral chest tubes are noted. Stable bilateral lung opacities are noted most consistent with multifocal pneumonia. The visualized skeletal structures are unremarkable. IMPRESSION: Stable support apparatus. Stable bilateral lung opacities are noted most consistent with multifocal pneumonia. Stable bilateral chest tubes are noted without pneumothorax. Electronically Signed   By: Lupita RaiderJames  Green Jr M.D.   On: 02/20/2019 10:38   DG CHEST PORT 1 VIEW  Result Date: 02/19/2019 CLINICAL DATA:  54 year old male with acute respiratory failure, hypoxia. COVID-19. Pneumonia. EXAM: PORTABLE CHEST 1 VIEW COMPARISON:  02/17/2019 portable chest and earlier. FINDINGS: Portable AP semi upright view at 0458 hours. Endotracheal tube tip visible at the level the clavicles. Enteric tube in place with side hole at the level of the gastric body. A right PICC line is now in place, tip at the level of the cavoatrial junction. Bilateral chest tubes appear stable. Larger lung volumes. Normal cardiac size and mediastinal contours. No pneumothorax identified. Coarse basilar predominant and right peripheral lung interstitial opacity. Lung base ventilation has mildly improved since yesterday. No areas of worsening ventilation. Negative visible bowel gas pattern. IMPRESSION: 1. Right PICC line placed. Otherwise, stable lines and tubes. 2. No  pneumothorax identified. Bilateral COVID-19 pneumonia with mildly improved lung base ventilation since yesterday. Electronically Signed   By: Odessa FlemingH  Hall M.D.   On: 02/19/2019 07:28   DG CHEST PORT 1 VIEW  Result Date: 02/17/2019 CLINICAL DATA:  COVID pneumonia EXAM: PORTABLE CHEST 1 VIEW COMPARISON:  02/15/2019 FINDINGS: The endotracheal tube is 4 cm above the carina. The NG tube is stable. Stable bilateral chest tubes. No pneumothorax. Persistent bilateral interstitial infiltrates. No definite pleural effusions. IMPRESSION: 1. Stable support apparatus. 2. Persistent interstitial infiltrates. Electronically Signed   By: Rudie MeyerP.  Gallerani M.D.   On: 02/17/2019 08:31   DG Chest Port 1 View  Result Date: 02/15/2019 CLINICAL DATA:  COVID pneumonia EXAM: PORTABLE CHEST 1 VIEW COMPARISON:  02/13/2019 FINDINGS: The endotracheal tube is 3.2 cm above the carina. The NG tube is coursing down the esophagus and into the stomach. Bilateral chest tubes in stable position without definite pneumothorax. Persistent bilateral infiltrates but slight improved lung aeration. IMPRESSION: 1. Stable support apparatus. 2. Slight improved lung aeration. Electronically Signed   By: Rudie MeyerP.  Gallerani M.D.   On: 02/15/2019 05:07   DG Chest Port 1 View  Result Date: 02/13/2019 CLINICAL DATA:  Ventilator support.  Coronavirus pneumonia. EXAM: PORTABLE CHEST 1 VIEW COMPARISON:  02/12/2019 FINDINGS: Endotracheal tube tip is 3.5 cm above the carina. Orogastric or nasogastric tube enters the abdomen. Bilateral thoracostomy tubes remain in place. No visible pneumothorax on either side. Widespread patchy pulmonary infiltrates appear similar or slightly worsened. IMPRESSION: Lines and tubes well positioned. No visible pneumothorax. Persistent or possibly slightly worsened pulmonary infiltrates. Electronically Signed   By: Paulina FusiMark  Shogry M.D.   On: 02/13/2019 06:53   DG Chest Port 1 View  Result Date: 02/12/2019 CLINICAL DATA:  Pneumothorax EXAM:  PORTABLE CHEST 1 VIEW COMPARISON:  Radiograph 02/11/2019 FINDINGS: Endotracheal tube terminates in  the mid trachea. Transesophageal tube tip and side port distal to the GE junction. Bilateral chest tubes remain in place. Telemetry leads and additional support devices overlie the chest and base of the neck. Bilateral mixed reticular and patchy opacities in both lungs are unchanged from prior. Trace residual medial left pneumothorax is again seen. No right pneumothorax. No visible pleural fluid. IMPRESSION: Stable exam with trace residual medial left pneumothorax. Bilateral chest tubes remain in place. Additional lines and tubes as above. Electronically Signed   By: Kreg Shropshire M.D.   On: 02/12/2019 06:31   DG Chest Port 1 View  Result Date: 02/10/2019 CLINICAL DATA:  COVID EXAM: PORTABLE CHEST 1 VIEW COMPARISON:  02/09/2019 FINDINGS: Endotracheal and enteric tubes are again identified. Bilateral chest tubes remain present. Persistent small left pneumothorax. No right pneumothorax. Similar bilateral pulmonary opacities. Stable cardiomediastinal contours. IMPRESSION: Persistent small left pneumothorax. Similar bilateral pulmonary opacities. Electronically Signed   By: Guadlupe Spanish M.D.   On: 02/10/2019 11:07   DG Chest Port 1 View  Result Date: 02/09/2019 CLINICAL DATA:  Follow-up of pneumothoraces. Chronic respiratory failure with hypoxia EXAM: PORTABLE CHEST 1 VIEW COMPARISON:  One day prior FINDINGS: Endotracheal tube terminates 6.8 cm above carina. Nasogastric tube extends beyond the inferior aspect of the film. The left-sided chest tube is unchanged in position. Significant enlargement of a moderate to large left-sided pneumothorax. No mediastinal shift. The right-sided chest tube may have been retracted minimally. Similar 5% right-sided pneumothorax. Subcutaneous air about the chest is improved. Normal heart size. Lower lung predominant interstitial opacities are similar. IMPRESSION: Significant  increase in a moderate to large left-sided pneumothorax, with chest tube in place. Similar small right-sided pneumothorax with right chest tube in place. Bilateral COVID-19 pneumonia, similar. These results will be called to the ordering clinician or representative by the Radiologist Assistant, and communication documented in the PACS or zVision Dashboard. Electronically Signed   By: Jeronimo Greaves M.D.   On: 02/09/2019 11:05   DG Chest Port 1 View  Result Date: 02/08/2019 CLINICAL DATA:  Chest tube placement EXAM: PORTABLE CHEST 1 VIEW COMPARISON:  02/08/2019 FINDINGS: Interval placement of right chest tube in good position. Decreased right pneumothorax which is now small. Left chest tube placed in the interval. No residual pneumothorax on the left. Extensive subcutaneous emphysema unchanged. NG tube enters the stomach. Endotracheal tube in good position. Diffuse bilateral airspace disease with progression in the lung bases. No effusion IMPRESSION: Bilateral chest tube placement. Small right pneumothorax. No residual pneumothorax on the left. Extensive subcutaneous emphysema Diffuse bilateral airspace disease. Progression of bibasilar infiltrates/atelectasis since earlier today. Electronically Signed   By: Marlan Palau M.D.   On: 02/08/2019 13:58   DG CHEST PORT 1 VIEW  Result Date: 02/07/2019 CLINICAL DATA:  ETT placement. EXAM: PORTABLE CHEST 1 VIEW COMPARISON:  02/06/2019 FINDINGS: 1155 hours. Endotracheal tube tip is 2.5 cm above the base of the carina. The NG tube passes into the stomach although the distal tip position is not included on the film. The bilateral patchy airspace disease with peripheral predominance is similar to prior. Cardiopericardial silhouette is at upper limits of normal for size. Lung volumes remain low. The visualized bony structures of the thorax are intact. Telemetry leads overlie the chest. IMPRESSION: 1. Endotracheal tube tip is 2.5 cm above the base of the carina. 2. Similar  appearance of patchy bilateral airspace disease compatible with multifocal pneumonia. Electronically Signed   By: Kennith Center M.D.   On: 02/07/2019 12:24  DG Chest Port 1 View  Result Date: 02/06/2019 CLINICAL DATA:  Respiratory distress. EXAM: PORTABLE CHEST 1 VIEW COMPARISON:  01/26/2019 FINDINGS: Of 731 hours. Low volume film. Patchy bilateral ground-glass and confluent airspace disease noted bilaterally with a peripheral predominance. No evidence for pleural effusion. Cardiopericardial silhouette is at upper limits of normal for size. The visualized bony structures of the thorax are intact. Telemetry leads overlie the chest. IMPRESSION: Low volume film with multifocal airspace disease compatible with pneumonia. Electronically Signed   By: Kennith Center M.D.   On: 02/06/2019 08:18   DG Abd Portable 1V  Result Date: 02/07/2019 CLINICAL DATA:  OG tube placement EXAM: PORTABLE ABDOMEN - 1 VIEW COMPARISON:  None. FINDINGS: 1157 hours. The NG tube is coiled in the stomach. Bowel gas pattern is nonspecific. The visualized lower lung show patchy bilateral airspace disease. IMPRESSION: NG tube is coiled in the stomach with the tip in the antrum. Stomach is nondistended. Electronically Signed   By: Kennith Center M.D.   On: 02/07/2019 12:25   ECHOCARDIOGRAM LIMITED  Result Date: 02/17/2019   ECHOCARDIOGRAM LIMITED REPORT   Patient Name:   Andrew Bruce Date of Exam: 02/17/2019 Medical Rec #:  [id number]       Height:       66.0 in Accession #:    [id number]      Weight:       168.4 lb Date of Birth:  03-Apr-1965       BSA:          1.86 m Patient Age:    53 years        BP:           99/70 mmHg Patient Gender: M               HR:           104 bpm. Exam Location:  Inpatient  Procedure: Limited Echo, Limited Color Doppler and Cardiac Doppler Indications:    Stroke I163.9  History:        Patient has no prior history of Echocardiogram examinations.                 Covid-19 Positive  Sonographer:    Thurman Coyer RDCS (AE) Referring Phys: [phone] MCNEILL P KIRKPATRICK  Sonographer Comments: Echo performed with patient supine and on artificial respirator. IMPRESSIONS  1. Left ventricular ejection fraction, by visual estimation, is 55 to 60%. The left ventricle has normal function. There is no increased left ventricular wall thickness.  2. The left ventricle has no regional wall motion abnormalities.  3. Global right ventricle has moderately reduced systolic function.The right ventricular size is mildly enlarged. no increase in right ventricular wall thickness.  4. Hypokinetic right ventricular free wall. McConnell's sign is present.  5. Right ventricle findings are consistent with acute cor pulmonale, such as pulmonary embolism.  6. Small pericardial effusion.  7. The pericardial effusion is anterior to the right ventricle.  8. The tricuspid valve was normal in structure. Tricuspid valve regurgitation is trivial.  9. The aortic root was not well visualized. 10. Mildly elevated pulmonary artery systolic pressure. 11. The inferior vena cava is dilated in size with <50% respiratory variability, suggesting severely increased right atrial pressure, butt his is a nonspecific finding due to positive pressure ventilation. 12. Findings reviewed with Dr. Adaline Sill, MD. FINDINGS  Left Ventricle: Left ventricular ejection fraction, by visual estimation, is 55 to 60%. The left ventricle has normal function. The left  ventricle has no regional wall motion abnormalities. The left ventricular internal cavity size was the left ventricle is normal in size. There is no increased left ventricular wall thickness. Left ventricular diastolic parameters were normal. Normal left atrial pressure. Right Ventricle: The right ventricular size is mildly enlarged. No increase in right ventricular wall thickness. Global RV systolic function is has moderately reduced systolic function. The tricuspid regurgitant velocity is 2.06 m/s, and with an  assumed right atrial pressure of 15 mmHg, the estimated right ventricular systolic pressure is mildly elevated at 32.0 mmHg. The motion of the right ventricle free wall hypokinetic. Right ventricle findings are consistent with cor pulmonale. Left Atrium: Left atrial size was normal in size. Right Atrium: Right atrial size was normal in size. Right atrial pressure is estimated at 15 mmHg. Pericardium: A small pericardial effusion is present is seen. A small pericardial effusion is present. The pericardial effusion is anterior to the right ventricle. There is no evidence of cardiac tamponade. Mitral Valve: MV Area by PHT, 3.99 cm. MV PHT, 55.10 msec. Tricuspid Valve: The tricuspid valve is normal in structure. Tricuspid valve regurgitation is trivial. Aortic Valve: The aortic valve is normal in structure. Aortic valve regurgitation is not visualized. Pulmonic Valve: The pulmonic valve was not well visualized. Pulmonic valve regurgitation is not visualized by color flow Doppler. Pulmonic regurgitation is not visualized by color flow Doppler. Aorta: The aortic root was not well visualized. Venous: The inferior vena cava is dilated in size with less than 50% respiratory variability, suggesting right atrial pressure of 15 mmHg. Shunts: No atrial level shunt detected by color flow Doppler.  LEFT VENTRICLE          Normals PLAX 2D LVIDd:         4.00 cm  3.6 cm   Diastology                 Normals LVIDs:         2.70 cm  1.7 cm   LV e' lateral:   7.18 cm/s 6.42 cm/s LV PW:         0.80 cm  1.4 cm   LV E/e' lateral: 9.2       15.4 LV IVS:        1.00 cm  1.3 cm   LV e' medial:    8.38 cm/s 6.96 cm/s LVOT diam:     2.30 cm  2.0 cm   LV E/e' medial:  7.9       6.96 LV SV:         43 ml    79 ml LV SV Index:   22.59    45 ml/m2 LVOT Area:     4.15 cm 3.14 cm2  LEFT ATRIUM           Index       RIGHT ATRIUM           Index LA diam:      2.30 cm 1.24 cm/m  RA Area:     10.00 cm LA Vol (A4C): 34.7 ml 18.67 ml/m RA Volume:    16.90 ml  9.09 ml/m   AORTA                 Normals Ao Root diam: 3.00 cm 31 mm MITRAL VALVE              Normals   TRICUSPID VALVE             Normals MV  Area (PHT): 3.99 cm             TR Peak grad:   17.0 mmHg MV PHT:        55.10 msec 55 ms     TR Vmax:        206.00 cm/s 288 cm/s MV Decel Time: 190 msec   187 ms MV E velocity: 66.00 cm/s 103 cm/s  SHUNTS MV A velocity: 63.40 cm/s 70.3 cm/s Systemic Diam: 2.30 cm MV E/A ratio:  1.04       1.5  Mihai Croitoru MD Electronically signed by Thurmon Fair MD Signature Date/Time: 02/17/2019/5:33:09 PM    Final    [place] EKG SITE RITE  Result Date: 02/18/2019 If Site Rite image not attached, placement could not be confirmed due to current cardiac rhythm.  [place] EKG SITE RITE  Result Date: 02/08/2019 If Site Rite image not attached, placement could not be confirmed due to current cardiac rhythm.   Labs:  CBC: Recent Labs    02/28/19 0437 02/28/19 0437 03/01/19 0446 03/04/19 0617 03/04/19 1030 03/05/19 1041  WBC 15.1*  --  13.0*  --  22.3* 15.6*  HGB 9.0*   < > 8.6* 9.9* 8.5* 8.2*  HCT 30.4*   < > 28.4* 29.0* 27.7* 26.8*  PLT 726*  --  663*  --  637* 668*   < > = values in this interval not displayed.    COAGS: Recent Labs    02/26/19 0342 03/02/19 0003  INR 1.1 1.1    BMP: Recent Labs    03/02/19 0443 03/02/19 0443 03/03/19 0721 03/04/19 0617 03/04/19 1030 03/05/19 1041  NA 139   < > 143 136 135 140  K 3.1*   < > 4.1 3.5 3.5 2.8*  CL 96*  --  101  --  100 102  CO2 36*  --  33*  --  26 27  GLUCOSE 137*  --  139*  --  145* 84  BUN 25*  --  20  --  17 19  CALCIUM 7.9*  --  8.1*  --  8.1* 8.3*  CREATININE <0.30*  --  0.38*  --  0.52* 0.42*  GFRNONAA NOT CALCULATED  --  >60  --  >60 >60  GFRAA NOT CALCULATED  --  >60  --  >60 >60   < > = values in this interval not displayed.    LIVER FUNCTION TESTS: Recent Labs    02/11/19 0451 02/12/19 0623 02/25/19 0500 02/26/19 0342  BILITOT 0.8 0.4 0.4 0.5  AST 20 21 31 26   ALT  25 28 31 27   ALKPHOS 53 63 119 110  PROT 5.2* 5.4* 5.9* 5.4*  ALBUMIN 2.6* 2.6* 1.4* 1.3*    TUMOR MARKERS: No results for input(s): AFPTM, CEA, CA199, CHROMGRNA in the last 8760 hours.  Assessment and Plan:  54 y/o M currently admitted for acute respiratory failure 2/2 Covid-19 ultimately requiring tracheostomy and mechanical ventilation. Currently receiving supplemental nutrition via cortrak - IR has been consulted for gastrostomy placement for long term nutrition. Patient imaging has been reviewed by Dr. Lowella Dandy who approves patient for procedure.  Will tentatively plan on proceeding with gastrostomy placement Tuesday (2/23) in IR pending any emergent procedures. Tube feeds to be held at midnight on 2/23, last dose of Lovenox will be 2/22 (may resume post procedure), AM labs.   I attempted to call the patient's daughter without answer at the time of this note writing - will continue to  work towards consent for the procedure.  Please call IR with questions or concerns.  Thank you for this interesting consult.  I greatly enjoyed meeting Jagger Fadley and look forward to participating in their care.  A copy of this report was sent to the requesting provider on this date.  Electronically Signed: Villa Herb, PA-C 03/05/2019, 1:52 PM   I spent a total of 40 Minutes in face to face in clinical consultation, greater than 50% of which was counseling/coordinating care for gastrostomy placement.

## 2019-03-05 NOTE — Progress Notes (Signed)
Inpatient Diabetes Program Recommendations  AACE/ADA: New Consensus Statement on Inpatient Glycemic Control (2015)  Target Ranges:  Prepandial:   less than 140 mg/dL      Peak postprandial:   less than 180 mg/dL (1-2 hours)      Critically ill patients:  140 - 180 mg/dL   Lab Results  Component Value Date   GLUCAP 74 03/05/2019   HGBA1C 11.7 (H) 02/17/2019    Review of Glycemic Control Results for Andrew Bruce, Andrew Bruce (MRN [id number]) as of 03/05/2019 09:39  Ref. Range 03/04/2019 23:32 03/05/2019 03:42 03/05/2019 07:48  Glucose-Capillary Latest Ref Range: 70 - 99 mg/dL 95 87 74   Diabetes history:New onset Outpatient Diabetes medications:none Current orders for Inpatient glycemic control:Novolog 2 units Q4H, Levemir 40 units BID, Novolog 0-20 units Q4H  Inpatient Diabetes Program Recommendations: Cortrak dislodged. Current CBG 74 mg/dL.  Dextrose 20 ml/hr added.    Consider decreasing Levemir 12 units BID (to start this AM).   Thanks, Lujean Rave, MSN, RNC-OB Diabetes Coordinator [phone] (8a-5p)

## 2019-03-05 NOTE — Progress Notes (Signed)
PT Cancellation Note  Patient Details Name: Andrew Bruce MRN: [id number] DOB: 1965/04/17   Cancelled Treatment:    Reason Eval/Treat Not Completed: Other (comment). Spoke with RN. Pt not responding, following commands or purposefully moving extremities; not appropriate for acute PT intervention. Will sign off. Please reconsult if new needs arise.  Ina Homes, PT, DPT Acute Rehabilitation Services  Pager [phone] Office [phone]  [name] 03/05/2019, 2:01 PM

## 2019-03-05 NOTE — Progress Notes (Signed)
NAME:  Andrew Bruce, MRN:  [id number], DOB:  1965-06-25, LOS: 54 ADMISSION DATE:  02/06/2019, CONSULTATION DATE:  02/06/19 REFERRING MD:  ER, CHIEF COMPLAINT:  SOB   Brief History   [name] without a past medical history admitted 1/23 with 48 hours of worsening shortness of breath in the context of a 10-day Covid illness (dry cough, lost taste/smell).  Chest x-ray and vitals are consistent with a worsening COVID ARDS.  Admitted for evaluation, developed worsening shortness of breath and increasing O2 needs. Decompensated requiring intubation 1/24.  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   Past Medical History  None known prior to admit   Significant Hospital Events   1/23 Admit  1/24 Decompensated, tx to ICU, intubated 1/25 b/l chest tubes placed 1/25 CVC 2/03 flomax started 2/06 rt chest tube connection changed 2/09 Still no BM. Afebrile.  Left chest tube connection changed 2/10 Palliative care meeting  -> full code, full aggressive care including trach, lact. 40% fio2, 10 peep.  Currently on propofol and fentanyl infusion.  Not on pressors.  On tube feeds.  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 afbrile snce 2/7. TGL improved to 161 2/16 Trach 2/17 Chest tubes removed 2/18 witnessed aspiration 2/19 Cortrak dislodged / removed  Consults:  Neurology  Palliative care  Procedures:  ETT 1/24 >> 2/16 Bilateral chest tube placement 1/25 >> 2/17 R Femoral TLC 1/25 >> 2/4 RUE PICC 2/4 >> Trach 2/16 >>   Significant Diagnostic Tests:  CT Head 2/2 >> no acute intracranial hemorrhage, large area of infarct in the left occipital lobe, likely subacute CTA Chest 2/3 >> LLL segmental PE, no CT evidence of right heart strain, multifocal PNA, background emphysema and probable degree of interstitial fibrosis ECHO 2/3 >> LVEF 55-60%, no WMA, hypokinetic right ventricular free wall, McConnells sign  present. RV findings consistent with acute cor pulmonale, small pericardial effusion, mildly elevated pulmonary artery systolic pressure  Micro Data:  COVID 1/12 >> positive  PCT 1/23 >> Neg Blood cx 2/5 >> negative Sputum 2/6 >> abundant diphtheroids Corynebacterium species  Sputum 2/18 >>   Antimicrobials:  Remdesivir 1/23>>1/28 Dexamethasone 1/23 >> 2/2  Vanc 2/5 >> 2/12  Zosyn 2/5 >> 2/9 Cefepime 2/9 >> 2/11 Unasyn 2/18 >>   Interim history/subjective:  Tmax 100.2 I/O - 1.6L UOP, net neg 943ml in 24 hours (remains 1.6L+ overall) Glucose range 74-170 RN reports cortrak dislodged overnight, removed.    Objective   Blood pressure 107/70, pulse 96, temperature 100.2 F (37.9 C), temperature source Axillary, resp. rate (!) 25, height 5\' 6"  (1.676 m), weight 83 kg, SpO2 96 %.    Vent Mode: CPAP;PSV FiO2 (%):  [50 %] 50 % Set Rate:  [22 bmp] 22 bmp Vt Set:  [510 mL] 510 mL PEEP:  [5 cmH20] 5 cmH20 Pressure Support:  [12 cmH20] 12 cmH20 Plateau Pressure:  [18 cmH20-21 cmH20] 18 cmH20   Intake/Output Summary (Last 24 hours) at 03/05/2019 0802 Last data filed at 03/05/2019 0700 Gross per 24 hour  Intake 658.95 ml  Output 1510 ml  Net -851.05 ml   Filed Weights   03/02/19 0424 03/03/19 0446 03/05/19 0415  Weight: 83.4 kg 82.8 kg 83 kg    General: chronically ill appearing adult male lying in bed in NAD  HEENT: MM pink/moist, pupils 43mm, anicteric, trach midline c/d/i Neuro: sedate, minimal response to painful stimlu CV: s1s2 rrr, no m/r/g PULM: non-labored on vent, PSV  ventilation, lungs bilaterally coarse GI: soft, bsx4 active  Extremities: warm/dry, no edema  Skin: no rashes. Necrotic toes bilaterally, dry.   Resolved Hospital Problem list   Oliguria, hypernatremia Bilateral pneumothoraces Ventilator associated pneumonia  Assessment & Plan:   Acute hypoxemic respiratory failure due to COVID-19 pneumonia, ARDS, PE, left lower lobe pneumonia -PRVC 8cc/kg as rest  mode -ok for daily SBT / PSV wean  -remove trach sutures 2/23  -follow intermittent CXR  -abx as above   Acute Metabolic Encephalopathy  Suspect sedation related at this point  -reduce fentanyl, seroquel, oxycodone dosing  -hold scheduled klonopin  -PRN versed  Pulmonary embolism -continue lovenox   Aortic clot Ischemic Foot Changes  -lovenox as above  -follow toes / wounds   Acute right heart failure -lasix 40 mg IV x1   Left occipital and right frontal CVA -supportive measures  -PT/OT efforts as able, passive ROM  -lipitor   COVID-19 Completed decadron, remdesivir  -follow / supportive care   Aspiration, fevers  -continue unasyn  -follow fever curve / WBC trend  Feeding Tube Dislodgment  -replace cortrak  Steroid-induced hyperglycemia -D10 while TF on hold -continue levemir, TF coverage   Anemia critical illness -follow CBC  -transfuse for Hgb <7%  Best practice:  Diet: TF Pain/Anxiety/Delirium protocol (if indicated): see above VAP protocol (if indicated): Ordered DVT prophylaxis: lovenox Rx dose of 75mg  bid with pharmacy monitoring GI prophylaxis: Pepcid Glucose control: SSI and Levemir Mobility: Bedrest Code Status: Full Code per Pall care 2/10  Family Communication: Will update family on arrival.  Disposition: ICU  CC Time: 32 minutes   4/10, MSN, NP-C [facility] 03/05/2019, 8:03 AM   Please see [email or web address] for pager details.

## 2019-03-05 NOTE — Progress Notes (Signed)
Pt transported to CT and back to 2M09 with no complications.

## 2019-03-06 LAB — GLUCOSE, CAPILLARY
Glucose-Capillary: 107 mg/dL — ABNORMAL HIGH (ref 70–99)
Glucose-Capillary: 113 mg/dL — ABNORMAL HIGH (ref 70–99)
Glucose-Capillary: 139 mg/dL — ABNORMAL HIGH (ref 70–99)
Glucose-Capillary: 145 mg/dL — ABNORMAL HIGH (ref 70–99)
Glucose-Capillary: 147 mg/dL — ABNORMAL HIGH (ref 70–99)
Glucose-Capillary: 96 mg/dL (ref 70–99)

## 2019-03-06 LAB — BASIC METABOLIC PANEL
Anion gap: 11 (ref 5–15)
BUN: 22 mg/dL — ABNORMAL HIGH (ref 6–20)
CO2: 28 mmol/L (ref 22–32)
Calcium: 8.3 mg/dL — ABNORMAL LOW (ref 8.9–10.3)
Chloride: 103 mmol/L (ref 98–111)
Creatinine, Ser: 0.7 mg/dL (ref 0.61–1.24)
GFR calc Af Amer: >60 mL/min (ref >60)
GFR calc non Af Amer: >60 mL/min (ref >60)
Glucose, Bld: 130 mg/dL — ABNORMAL HIGH (ref 70–99)
Potassium: 2.9 mmol/L — ABNORMAL LOW (ref 3.5–5.1)
Sodium: 142 mmol/L (ref 135–145)

## 2019-03-06 LAB — CBC
HCT: 28.3 % — ABNORMAL LOW (ref 39.0–52.0)
Hemoglobin: 8.6 g/dL — ABNORMAL LOW (ref 13.0–17.0)
MCH: 26.8 pg (ref 26.0–34.0)
MCHC: 30.4 g/dL (ref 30.0–36.0)
MCV: 88.2 fL (ref 80.0–100.0)
Platelets: 702 10*3/uL — ABNORMAL HIGH (ref 150–400)
RBC: 3.21 MIL/uL — ABNORMAL LOW (ref 4.22–5.81)
RDW: 14.4 % (ref 11.5–15.5)
WBC: 13.5 10*3/uL — ABNORMAL HIGH (ref 4.0–10.5)
nRBC: 0 % (ref 0.0–0.2)

## 2019-03-06 LAB — CULTURE, RESPIRATORY W GRAM STAIN: Culture: NORMAL

## 2019-03-06 MED ORDER — POTASSIUM CHLORIDE 20 MEQ/15ML (10%) PO SOLN
40.0000 meq | ORAL | Status: AC
  Administered 2019-03-06 (×2): 40 meq
  Filled 2019-03-06 (×2): qty 30

## 2019-03-06 NOTE — Progress Notes (Signed)
NAME:  Andrew Bruce, MRN:  [id number], DOB:  July 22, 1965, LOS: 28 ADMISSION DATE:  02/06/2019, CONSULTATION DATE:  02/06/19 REFERRING MD:  ER, CHIEF COMPLAINT:  SOB   Brief History   [name] without a past medical history admitted 1/23 with 48 hours of worsening shortness of breath in the context of a 10-day Covid illness (dry cough, lost taste/smell).  Chest x-ray and vitals are consistent with a worsening COVID ARDS.  Admitted for evaluation, developed worsening shortness of breath and increasing O2 needs. Decompensated requiring intubation 1/24.  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   Past Medical History  None known prior to admit   Significant Hospital Events   1/23 Admit  1/24 Decompensated, tx to ICU, intubated 1/25 b/l chest tubes placed 1/25 CVC 2/03 flomax started 2/06 rt chest tube connection changed 2/09 Still no BM. Afebrile.  Left chest tube connection changed 2/10 Palliative care meeting  -> full code, full aggressive care including trach, lact. 40% fio2, 10 peep.  Currently on propofol and fentanyl infusion.  Not on pressors.  On tube feeds.  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 afbrile snce 2/7. TGL improved to 161 2/16 Trach 2/17 Chest tubes removed 2/18 witnessed aspiration 2/19 Cortrak dislodged / removed  Consults:  Neurology  Palliative care  Procedures:  ETT 1/24 >> 2/16 Bilateral chest tube placement 1/25 >> 2/17 R Femoral TLC 1/25 >> 2/4 RUE PICC 2/4 >> Trach 2/16 >>   Significant Diagnostic Tests:  CT Head 2/2 >> no acute intracranial hemorrhage, large area of infarct in the left occipital lobe, likely subacute CTA Chest 2/3 >> LLL segmental PE, no CT evidence of right heart strain, multifocal PNA, background emphysema and probable degree of interstitial fibrosis ECHO 2/3 >> LVEF 55-60%, no WMA, hypokinetic right ventricular free wall, McConnells sign  present. RV findings consistent with acute cor pulmonale, small pericardial effusion, mildly elevated pulmonary artery systolic pressure  Micro Data:  COVID 1/12 >> positive  PCT 1/23 >> Neg Blood cx 2/5 >> negative Sputum 2/6 >> abundant diphtheroids Corynebacterium species  Sputum 2/18 >>   Antimicrobials:  Remdesivir 1/23>>1/28 Dexamethasone 1/23 >> 2/2  Vanc 2/5 >> 2/12  Zosyn 2/5 >> 2/9 Cefepime 2/9 >> 2/11 Unasyn 2/18 >> 2/23 (planned)  Interim history/subjective:  New TF in place Waking up more  Objective   Blood pressure 110/77, pulse (!) 110, temperature 100.1 F (37.8 C), temperature source Axillary, resp. rate (!) 30, height 5\' 6"  (1.676 m), weight 83.5 kg, SpO2 94 %.    Vent Mode: PRVC FiO2 (%):  [50 %] 50 % Set Rate:  [22 bmp] 22 bmp Vt Set:  [510 mL] 510 mL PEEP:  [5 cmH20] 5 cmH20 Pressure Support:  [12 cmH20] 12 cmH20 Plateau Pressure:  [18 cmH20-123 cmH20] 123 cmH20   Intake/Output Summary (Last 24 hours) at 03/06/2019 0931 Last data filed at 03/06/2019 0800 Gross per 24 hour  Intake 2051.73 ml  Output 2650 ml  Net -598.27 ml   Filed Weights   03/03/19 0446 03/05/19 0415 03/06/19 0500  Weight: 82.8 kg 83 kg 83.5 kg   GEN: ill appearing man on vent HEENT: trach in place, CDI CV: Tachy, ext warm PULM: +rhonci, tachypneic GI: Soft, +BS EXT: No edema NEURO: no withdraw to pain, moves left side spontaneously but not to command, opens eyes but not to stimulus PSYCH: cannot assess SKIN: no rashes, toes ischemic   Resolved Hospital Problem  list   Oliguria, hypernatremia Bilateral pneumothoraces Ventilator associated pneumonia  Assessment & Plan:   Acute hypoxemic respiratory failure due to COVID-19 pneumonia, ARDS, PE, left lower lobe pneumonia -PRVC 8cc/kg as rest mode -PS trial today, potentially TC tomorrow -remove trach sutures 2/23  -follow intermittent CXR  -5 days unasyn for potential aspiration PNA  Acute Metabolic Encephalopathy    Improving with weaning sedation Stop oxycodone Monitor for s/s of withdrawal  Pulmonary embolism -continue lovenox   Aortic clot Ischemic Foot Changes  -lovenox as above  -follow toes / wounds   Acute right heart failure -hold diuretics today, replete K  Left occipital and right frontal CVA -supportive measures  -PT/OT efforts as able, passive ROM  -lipitor   COVID-19 Completed decadron, remdesivir  -follow / supportive care   Steroid-induced hyperglycemia -Levemir and TF coverage   Best practice:  Diet: TF Pain/Anxiety/Delirium protocol (if indicated): see above VAP protocol (if indicated): Ordered DVT prophylaxis: lovenox Rx dose of 75mg  bid with pharmacy monitoring GI prophylaxis: Pepcid Glucose control: SSI and Levemir Mobility: Bedrest Code Status: Full Code per Pall care 2/10  Family Communication: Will update family on arrival.  Disposition: ICU  The patient is critically ill with multiple organ systems failure and requires high complexity decision making for assessment and support, frequent evaluation and titration of therapies, application of advanced monitoring technologies and extensive interpretation of multiple databases. Critical Care Time devoted to patient care services described in this note independent of APP/resident time (if applicable)  is 32 minutes.   Erskine Emery MD [place] [facility] 03/06/2019 9:54 AM Personal pager: [phone] If unanswered, please page CCM On-call: [phone]

## 2019-03-06 NOTE — Progress Notes (Signed)
.  [facility] ADULT ICU REPLACEMENT PROTOCOL FOR AM LAB REPLACEMENT ONLY  The patient does apply for the [facility] Adult ICU Electrolyte Replacment Protocol based on the criteria listed below:   1. Is GFR >/= 40 ml/min? Yes.    Patient's GFR today is >60 2. Is urine output >/= 0.5 ml/kg/hr for the last 6 hours? Yes.   Patient's UOP is 1.4 ml/kg/hr 3. Is BUN < 60 mg/dL? Yes.    Patient's BUN today is 26 4. Abnormal electrolyte(s): K-2.9 5. Ordered repletion with: per protocol 6. If a panic level lab has been reported, has the CCM MD in charge been notified? Yes.  .   Physician:  Dr. Heywood Bene, [place] Boos 03/06/2019 6:16 AM

## 2019-03-07 LAB — BASIC METABOLIC PANEL
Anion gap: 10 (ref 5–15)
BUN: 17 mg/dL (ref 6–20)
CO2: 28 mmol/L (ref 22–32)
Calcium: 8.1 mg/dL — ABNORMAL LOW (ref 8.9–10.3)
Chloride: 103 mmol/L (ref 98–111)
Creatinine, Ser: 0.35 mg/dL — ABNORMAL LOW (ref 0.61–1.24)
GFR calc Af Amer: >60 mL/min (ref >60)
GFR calc non Af Amer: >60 mL/min (ref >60)
Glucose, Bld: 108 mg/dL — ABNORMAL HIGH (ref 70–99)
Potassium: 3.2 mmol/L — ABNORMAL LOW (ref 3.5–5.1)
Sodium: 141 mmol/L (ref 135–145)

## 2019-03-07 LAB — GLUCOSE, CAPILLARY
Glucose-Capillary: 106 mg/dL — ABNORMAL HIGH (ref 70–99)
Glucose-Capillary: 118 mg/dL — ABNORMAL HIGH (ref 70–99)
Glucose-Capillary: 153 mg/dL — ABNORMAL HIGH (ref 70–99)
Glucose-Capillary: 164 mg/dL — ABNORMAL HIGH (ref 70–99)
Glucose-Capillary: 177 mg/dL — ABNORMAL HIGH (ref 70–99)
Glucose-Capillary: 198 mg/dL — ABNORMAL HIGH (ref 70–99)
Glucose-Capillary: 98 mg/dL (ref 70–99)

## 2019-03-07 MED ORDER — POTASSIUM CHLORIDE 20 MEQ/15ML (10%) PO SOLN
30.0000 meq | ORAL | Status: AC
  Administered 2019-03-07 (×2): 30 meq
  Filled 2019-03-07 (×2): qty 30

## 2019-03-07 MED ORDER — SCOPOLAMINE 1 MG/3DAYS TD PT72
1.0000 | MEDICATED_PATCH | TRANSDERMAL | Status: DC
  Administered 2019-03-07 – 2019-03-16 (×4): 1.5 mg via TRANSDERMAL
  Filled 2019-03-07 (×4): qty 1

## 2019-03-07 NOTE — Progress Notes (Signed)
[facility] ADULT ICU REPLACEMENT PROTOCOL FOR AM LAB REPLACEMENT ONLY  The patient does apply for the [facility] Adult ICU Electrolyte Replacment Protocol based on the criteria listed below:   1. Is GFR >/= 40 ml/min? Yes.    Patient's GFR today is >60 2. Is urine output >/= 0.5 ml/kg/hr for the last 6 hours? Yes.   Patient's UOP is 2 ml/kg/hr 3. Is BUN < 60 mg/dL? Yes.    Patient's BUN today is 17 4. Abnormal electrolyte(s): K-3.2 5. Ordered repletion with: per protocol 6. If a panic level lab has been reported, has the CCM MD in charge been notified? Yes.  .   Physician:  Dr. Renold Genta, [place] Boos 03/07/2019 6:15 AM

## 2019-03-07 NOTE — Progress Notes (Signed)
NAME:  Andrew Bruce, MRN:  [id number], DOB:  Jun 19, 1965, LOS: 58 ADMISSION DATE:  02/06/2019, CONSULTATION DATE:  02/06/19 REFERRING MD:  ER, CHIEF COMPLAINT:  SOB   Brief History   [name] without a past medical history admitted 1/23 with 48 hours of worsening shortness of breath in the context of a 10-day Covid illness (dry cough, lost taste/smell).  Chest x-ray and vitals are consistent with a worsening COVID ARDS.  Admitted for evaluation, developed worsening shortness of breath and increasing O2 needs. Decompensated requiring intubation 1/24.  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   Past Medical History  None known prior to admit   Significant Hospital Events   1/23 Admit  1/24 Decompensated, tx to ICU, intubated 1/25 b/l chest tubes placed 1/25 CVC 2/03 flomax started 2/06 rt chest tube connection changed 2/09 Still no BM. Afebrile.  Left chest tube connection changed 2/10 Palliative care meeting  -> full code, full aggressive care including trach, lact. 40% fio2, 10 peep.  Currently on propofol and fentanyl infusion.  Not on pressors.  On tube feeds.  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 afbrile snce 2/7. TGL improved to 161 2/16 Trach 2/17 Chest tubes removed 2/18 witnessed aspiration 2/19 Cortrak dislodged / removed 2/21 woke up  Consults:  Neurology  Palliative care  Procedures:  ETT 1/24 >> 2/16 Bilateral chest tube placement 1/25 >> 2/17 R Femoral TLC 1/25 >> 2/4 RUE PICC 2/4 >> Trach 2/16 >>   Significant Diagnostic Tests:  CT Head 2/2 >> no acute intracranial hemorrhage, large area of infarct in the left occipital lobe, likely subacute CTA Chest 2/3 >> LLL segmental PE, no CT evidence of right heart strain, multifocal PNA, background emphysema and probable degree of interstitial fibrosis ECHO 2/3 >> LVEF 55-60%, no WMA, hypokinetic right ventricular free wall,  McConnells sign present. RV findings consistent with acute cor pulmonale, small pericardial effusion, mildly elevated pulmonary artery systolic pressure  Micro Data:  COVID 1/12 >> positive  PCT 1/23 >> Neg Blood cx 2/5 >> negative Sputum 2/6 >> abundant diphtheroids Corynebacterium species  Sputum 2/18 >> normal flora  Antimicrobials:  Remdesivir 1/23>>1/28 Dexamethasone 1/23 >> 2/2  Vanc 2/5 >> 2/12  Zosyn 2/5 >> 2/9 Cefepime 2/9 >> 2/11 Unasyn 2/18 >> 2/23 (planned)  Interim history/subjective:  Woke up today, following commands.  Objective   Blood pressure (!) 152/92, pulse (!) 114, temperature 99.2 F (37.3 C), temperature source Axillary, resp. rate 18, height 5\' 6"  (1.676 m), weight 83 kg, SpO2 98 %.    Vent Mode: CPAP;PSV FiO2 (%):  [40 %] 40 % Set Rate:  [22 bmp] 22 bmp Vt Set:  [510 mL] 510 mL PEEP:  [5 cmH20] 5 cmH20 Pressure Support:  [8 cmH20] 8 cmH20 Plateau Pressure:  [16 cmH20-25 cmH20] 25 cmH20   Intake/Output Summary (Last 24 hours) at 03/07/2019 1017 Last data filed at 03/07/2019 5102 Gross per 24 hour  Intake 1860 ml  Output 3500 ml  Net -1640 ml   Filed Weights   03/05/19 0415 03/06/19 0500 03/07/19 0442  Weight: 83 kg 83.5 kg 83 kg   GEN: ill appearing man on vent HEENT: trach in place, CDI CV: Tachy, ext warm PULM: +rhonci, tachypneic GI: Soft, +BS EXT: No edema NEURO: now following commands PSYCH: RASS -1 SKIN: no rashes, toes ischemic   Resolved Hospital Problem list   Oliguria, hypernatremia Bilateral pneumothoraces Ventilator associated pneumonia  Assessment &  Plan:   Acute hypoxemic respiratory failure due to COVID-19 pneumonia, ARDS, PE, left lower lobe pneumonia -TC trial -remove trach sutures 2/23  -follow intermittent CXR  -5 days unasyn for potential aspiration PNA  Acute Metabolic Encephalopathy  All sedating meds DC'd except PRNs  Pulmonary embolism -continue lovenox , switch to NOAC once PEG in  Aortic  clot Ischemic Foot Changes  -lovenox as above  -follow toes / wounds   Acute right heart failure -hold diuretics, diuresing well on own  Left occipital and right frontal CVA -supportive measures  -PT/OT efforts as able, passive ROM  -lipitor   COVID-19 Completed decadron, remdesivir  -follow / supportive care   Steroid-induced hyperglycemia -Levemir and TF coverage   Best practice:  Diet: TF Pain/Anxiety/Delirium protocol (if indicated): see above VAP protocol (if indicated): Ordered DVT prophylaxis: lovenox Rx dose of 75mg  bid with pharmacy monitoring GI prophylaxis: Pepcid Glucose control: SSI and Levemir Mobility: Bedrest Code Status: Full Code per Pall care 2/10  Family Communication: Updated wife 2/20 Disposition: ICU  The patient is critically ill with multiple organ systems failure and requires high complexity decision making for assessment and support, frequent evaluation and titration of therapies, application of advanced monitoring technologies and extensive interpretation of multiple databases. Critical Care Time devoted to patient care services described in this note independent of APP/resident time (if applicable)  is 34 minutes.   3/20 MD [place] [facility] 03/07/2019 10:17 AM Personal pager: [phone] If unanswered, please page CCM On-call: #[phone]

## 2019-03-08 LAB — BASIC METABOLIC PANEL
Anion gap: 12 (ref 5–15)
BUN: 15 mg/dL (ref 6–20)
CO2: 28 mmol/L (ref 22–32)
Calcium: 8.5 mg/dL — ABNORMAL LOW (ref 8.9–10.3)
Chloride: 100 mmol/L (ref 98–111)
Creatinine, Ser: 0.32 mg/dL — ABNORMAL LOW (ref 0.61–1.24)
GFR calc Af Amer: >60 mL/min (ref >60)
GFR calc non Af Amer: >60 mL/min (ref >60)
Glucose, Bld: 113 mg/dL — ABNORMAL HIGH (ref 70–99)
Potassium: 3.2 mmol/L — ABNORMAL LOW (ref 3.5–5.1)
Sodium: 140 mmol/L (ref 135–145)

## 2019-03-08 LAB — GLUCOSE, CAPILLARY
Glucose-Capillary: 201 mg/dL — ABNORMAL HIGH (ref 70–99)
Glucose-Capillary: 115 mg/dL — ABNORMAL HIGH (ref 70–99)
Glucose-Capillary: 127 mg/dL — ABNORMAL HIGH (ref 70–99)
Glucose-Capillary: 115 mg/dL — ABNORMAL HIGH (ref 70–99)
Glucose-Capillary: 111 mg/dL — ABNORMAL HIGH (ref 70–99)
Glucose-Capillary: 116 mg/dL — ABNORMAL HIGH (ref 70–99)

## 2019-03-08 LAB — MAGNESIUM: Magnesium: 2.3 mg/dL (ref 1.7–2.4)

## 2019-03-08 MED ORDER — ONDANSETRON HCL 4 MG/2ML IJ SOLN
4.0000 mg | Freq: Four times a day (QID) | INTRAMUSCULAR | Status: DC | PRN
  Administered 2019-03-08: 4 mg via INTRAVENOUS
  Filled 2019-03-08: qty 2

## 2019-03-08 MED ORDER — POTASSIUM CHLORIDE 20 MEQ/15ML (10%) PO SOLN: 40.0000 meq | Freq: Two times a day (BID) | ORAL | Status: DC

## 2019-03-08 MED ORDER — POTASSIUM CHLORIDE 20 MEQ/15ML (10%) PO SOLN
40.0000 meq | Freq: Two times a day (BID) | ORAL | Status: AC
  Administered 2019-03-08 (×2): 40 meq
  Filled 2019-03-08 (×2): qty 30

## 2019-03-08 MED ORDER — ENOXAPARIN SODIUM 80 MG/0.8ML ~~LOC~~ SOLN
80.0000 mg | Freq: Two times a day (BID) | SUBCUTANEOUS | Status: DC
  Filled 2019-03-08: qty 0.8

## 2019-03-08 MED ORDER — ONDANSETRON HCL 4 MG/2ML IJ SOLN: 4.0000 mg | Freq: Four times a day (QID) | INTRAMUSCULAR | Status: DC

## 2019-03-08 NOTE — Progress Notes (Signed)
NAME:  Andrew Bruce, MRN:  [id number], DOB:  [birth year], LOS: 54 ADMISSION DATE:  02/06/2019, CONSULTATION DATE:  02/06/19 REFERRING MD:  ER, CHIEF COMPLAINT:  SOB   Brief History   [name] without a past medical history admitted 1/23 with 48 hours of worsening shortness of breath in the context of a 10-day Covid illness (dry cough, lost taste/smell).  Chest x-ray and vitals are consistent with a worsening COVID ARDS.  Admitted for evaluation, developed worsening shortness of breath and increasing O2 needs. Decompensated requiring intubation 1/24.  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   Past Medical History  None known prior to admit   Significant Hospital Events   1/23 Admit  1/24 Decompensated, tx to ICU, intubated 1/25 b/l chest tubes placed 1/25 CVC 2/03 flomax started 2/06 rt chest tube connection changed 2/09 Still no BM. Afebrile.  Left chest tube connection changed 2/10 Palliative care meeting  -> full code, full aggressive care including trach, lact. 40% fio2, 10 peep.  Currently on propofol and fentanyl infusion.  Not on pressors.  On tube feeds.  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 afbrile snce 2/7. TGL improved to 161 2/16 Trach 2/17 Chest tubes removed 2/18 witnessed aspiration 2/19 Cortrak dislodged / removed 2/21 woke up  Consults:  Neurology  Palliative care  Procedures:  ETT 1/24 >> 2/16 Bilateral chest tube placement 1/25 >> 2/17 R Femoral TLC 1/25 >> 2/4 RUE PICC 2/4 >> Trach 2/16 >>   Significant Diagnostic Tests:  CT Head 2/2 >> no acute intracranial hemorrhage, large area of infarct in the left occipital lobe, likely subacute CTA Chest 2/3 >> LLL segmental PE, no CT evidence of right heart strain, multifocal PNA, background emphysema and probable degree of interstitial fibrosis ECHO 2/3 >> LVEF 55-60%, no WMA, hypokinetic right ventricular free wall,  McConnells sign present. RV findings consistent with acute cor pulmonale, small pericardial effusion, mildly elevated pulmonary artery systolic pressure  Micro Data:  COVID 1/12 >> positive  PCT 1/23 >> Neg Blood cx 2/5 >> negative Sputum 2/6 >> abundant diphtheroids Corynebacterium species  Sputum 2/18 >> normal flora  Antimicrobials:  Remdesivir 1/23>>1/28 Dexamethasone 1/23 >> 2/2  Vanc 2/5 >> 2/12  Zosyn 2/5 >> 2/9 Cefepime 2/9 >> 2/11 Unasyn 2/18 >> 2/23 (planned)  Interim history/subjective:  Continues to improve from mental status standpoint. Tolerating PS trial.  Following commands.  Objective   Blood pressure (!) 142/99, pulse (!) 116, temperature 98.3 F (36.8 C), temperature source Axillary, resp. rate (!) 24, height 5\' 6"  (1.676 m), weight 72.6 kg, SpO2 97 %.    Vent Mode: CPAP;PSV FiO2 (%):  [35 %-60 %] 40 % Set Rate:  [22 bmp] 22 bmp Vt Set:  [510 mL] 510 mL PEEP:  [5 cmH20] 5 cmH20 Pressure Support:  [8 cmH20] 8 cmH20 Plateau Pressure:  [10 cmH20] 10 cmH20   Intake/Output Summary (Last 24 hours) at 03/08/2019 0932 Last data filed at 03/08/2019 0800 Gross per 24 hour  Intake 840 ml  Output 4490 ml  Net -3650 ml   Filed Weights   03/06/19 0500 03/07/19 0442 03/08/19 0420  Weight: 83.5 kg 83 kg 72.6 kg   GEN: ill appearing man on vent HEENT: trach in place, CDI CV: Tachy, ext warm PULM: +rhonci, tachypneic GI: Soft, +BS EXT: No edema NEURO: now following commands PSYCH: RASS 0 SKIN: no rashes, toes ischemic   Resolved Hospital Problem list   Oliguria,  hypernatremia Bilateral pneumothoraces Ventilator associated pneumonia  Assessment & Plan:   Acute hypoxemic respiratory failure due to COVID-19 pneumonia, ARDS, PE, left lower lobe pneumonia -TC trial, if tolerates x 24h switch to cuffless in am -remove trach sutures 2/23  -follow intermittent CXR  -5 days unasyn for potential aspiration PNA  Acute Metabolic Encephalopathy  All sedating  meds DC'd except PRNs  Pulmonary embolism -continue lovenox , switch to NOAC once PEG in  Aortic clot Ischemic Foot Changes  -lovenox as above  -follow toes / wounds   Acute right heart failure -hold diuretics, diuresing well on own  Left occipital and right frontal CVA -supportive measures  -PT/OT efforts as able, passive ROM  -lipitor  -PEG  COVID-19 Completed decadron, remdesivir  -follow / supportive care   Steroid-induced hyperglycemia -Levemir and TF coverage   Best practice:  Diet: TF Pain/Anxiety/Delirium protocol (if indicated): see above VAP protocol (if indicated): Ordered DVT prophylaxis: lovenox Rx dose of 75mg  bid with pharmacy monitoring GI prophylaxis: Pepcid Glucose control: SSI and Levemir Mobility: Bedrest Code Status: Full Code per Pall care 2/10  Family Communication: wife visiting daily Disposition: ICU pending vent liberation  The patient is critically ill with multiple organ systems failure and requires high complexity decision making for assessment and support, frequent evaluation and titration of therapies, application of advanced monitoring technologies and extensive interpretation of multiple databases. Critical Care Time devoted to patient care services described in this note independent of APP/resident time (if applicable)  is 32 minutes.   4/10 MD [place] [facility] 03/08/2019 9:32 AM Personal pager: [phone] If unanswered, please page CCM On-call: #[phone]

## 2019-03-08 NOTE — Progress Notes (Signed)
Placed patient on 40% ATC. Patient seem to be breathing well, but could not stop coughing. I suctioned him & he began vomiting brown emesis. Placed back on the ventilator.

## 2019-03-08 NOTE — Consult Note (Signed)
WOC Nurse Consult Note: Patient receiving care at [facility] 2M09, RN present at bedside during assessment. Reason for Consult:Sacral wound Wound type:Patient has an unstageable wound to the coccyx measuring 6.0 cm x 3.0 cm x 0.5 cm wound is one hundred percent covered in yellow slough area is moist with no odor present.  Surrounding tissue is blanchable and intact.  RN applied a saline moistened gauze to coccyx cover with sacral foam.  Order to change moist to try dressing every shift or PRN for soilage placed.  Pressure Injury POA: No  Monitor the wound area's for worsening of condition such as, Signs/symptoms of infection, Increase in size, development of or worsening of odor, development of pain, or increased pain at the affected locations.  Notify the medical team if any of these develop.  Thank you for the consult.  Discussed plan of bedside nurse.  WOC nurse will continue follow.   Silvio Pate, RN, [facility]

## 2019-03-08 NOTE — Progress Notes (Signed)
Patient had one episode of brown emesis. Tube feeds stopped. When asked patient if he was nauseous, he nodded his head. Updated MD Katrinka Blazing, he ordered zofran IV PRN. Will continue to monitor.

## 2019-03-08 NOTE — Progress Notes (Signed)
SLP Cancellation Note  Patient Details Name: Andrew Bruce MRN: [id number] DOB: 04/21/1965   Cancelled treatment:        Pt vomited early this morning and not placed on trach collar at that time. RT contacted who reported he would attempt trach collar. RN and RT present however he again had emesis and was left on vent on PS setting. Will follow.    Royce Macadamia 03/08/2019, 10:36 AM   Breck Coons Lonell Face.Ed Nurse, children's [phone] Office [phone]

## 2019-03-08 NOTE — Progress Notes (Signed)
Patient ID: Andrew Bruce, male   DOB: July 11, 1965, 54 y.o.   MRN: [id number] Risks and benefits of image guided gastrostomy tube placement was discussed with the patient's spouse on 2/21 via interpreter including, but not limited to the need for a barium enema during the procedure, bleeding, infection, peritonitis and/or damage to adjacent structures.  All of the patient's spouse's questions were answered and she has signed consent for procedure  Case tent planned for 2/23

## 2019-03-09 LAB — BASIC METABOLIC PANEL
Anion gap: 10 (ref 5–15)
BUN: 17 mg/dL (ref 6–20)
CO2: 27 mmol/L (ref 22–32)
Calcium: 8.7 mg/dL — ABNORMAL LOW (ref 8.9–10.3)
Chloride: 104 mmol/L (ref 98–111)
Creatinine, Ser: 0.47 mg/dL — ABNORMAL LOW (ref 0.61–1.24)
GFR calc Af Amer: >60 mL/min (ref >60)
GFR calc non Af Amer: >60 mL/min (ref >60)
Glucose, Bld: 98 mg/dL (ref 70–99)
Potassium: 3.5 mmol/L (ref 3.5–5.1)
Sodium: 141 mmol/L (ref 135–145)

## 2019-03-09 LAB — GLUCOSE, CAPILLARY
Glucose-Capillary: 114 mg/dL — ABNORMAL HIGH (ref 70–99)
Glucose-Capillary: 120 mg/dL — ABNORMAL HIGH (ref 70–99)
Glucose-Capillary: 86 mg/dL (ref 70–99)
Glucose-Capillary: 92 mg/dL (ref 70–99)
Glucose-Capillary: 93 mg/dL (ref 70–99)
Glucose-Capillary: 95 mg/dL (ref 70–99)

## 2019-03-09 MED ORDER — FINASTERIDE 5 MG PO TABS
5.0000 mg | ORAL_TABLET | Freq: Every day | ORAL | Status: DC
  Administered 2019-03-09: 5 mg via ORAL
  Filled 2019-03-09 (×3): qty 1

## 2019-03-09 MED ORDER — POTASSIUM CHLORIDE 20 MEQ/15ML (10%) PO SOLN: 40.0000 meq | Freq: Two times a day (BID) | ORAL | Status: DC

## 2019-03-09 MED ORDER — POTASSIUM CHLORIDE 20 MEQ/15ML (10%) PO SOLN: 40.0000 meq | Freq: Once | ORAL | Status: DC

## 2019-03-09 MED ORDER — POTASSIUM CHLORIDE 20 MEQ/15ML (10%) PO SOLN: 40.0000 meq | ORAL | Status: AC

## 2019-03-09 MED ORDER — ENOXAPARIN SODIUM 80 MG/0.8ML ~~LOC~~ SOLN
80.0000 mg | Freq: Two times a day (BID) | SUBCUTANEOUS | Status: DC
  Administered 2019-03-10 – 2019-03-11 (×2): 80 mg via SUBCUTANEOUS
  Filled 2019-03-09 (×3): qty 0.8

## 2019-03-09 MED ORDER — PANCRELIPASE (LIP-PROT-AMYL) 10440-39150 UNITS PO TABS
20880.0000 [IU] | ORAL_TABLET | Freq: Once | ORAL | Status: AC
  Administered 2019-03-09: 20880 [IU]
  Filled 2019-03-09: qty 2

## 2019-03-09 MED ORDER — TAMSULOSIN HCL 0.4 MG PO CAPS
0.4000 mg | ORAL_CAPSULE | Freq: Every day | ORAL | Status: DC
  Administered 2019-03-09: 0.4 mg via ORAL
  Filled 2019-03-09: qty 1

## 2019-03-09 MED ORDER — SODIUM BICARBONATE 650 MG PO TABS
650.0000 mg | ORAL_TABLET | Freq: Once | ORAL | Status: AC
  Administered 2019-03-09: 18:00:00 650 mg
  Filled 2019-03-09: qty 1

## 2019-03-09 NOTE — Progress Notes (Addendum)
Patient has been NPO for scheduled IR procedure for peg tube. Case was cancelled today and re-scheduled for tomorrow 2/24 per IR nurse. Checked cortrack and was working well when given 4pm medicines. Also re-started trickle feeds. Kangaroo pump stated it was clogged inline- Attempted to dislodge and flush cortrack, did not work. Started the unclogging tube feed protocol, it was unsuccessful. I told wife that he will NPO at midnight for peg tube procedure tomorrow 2/24. She told me she wanted him to at least try a NG tube and that he is hungry. I told her the NG Tube placement will be uncomfortable for him, she insisted. I updated MD Katrinka Blazing. I removed cortrack since it will not unclogg. I tried 2x to place NG tube-unsuccessful. I paged MD Katrinka Blazing to try himself- he was unable to place. Stated since he should go to IR tomorrow for peg tube and not emergent then we will continue to check blood sugars.  Wife understood and at bedside. Will continue to monitor.

## 2019-03-09 NOTE — Progress Notes (Signed)
NAME:  Andrew Bruce, MRN:  [id number], DOB:  1965/10/05, LOS: 31 ADMISSION DATE:  02/06/2019, CONSULTATION DATE:  02/06/19 REFERRING MD:  ER, CHIEF COMPLAINT:  SOB   Brief History   [name] without a past medical history admitted 1/23 with 48 hours of worsening shortness of breath in the context of a 10-day Covid illness (dry cough, lost taste/smell).  Chest x-ray and vitals are consistent with a worsening COVID ARDS.  Admitted for evaluation, developed worsening shortness of breath and increasing O2 needs. Decompensated requiring intubation 1/24.  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   Past Medical History  None known prior to admit   Significant Hospital Events   1/23 Admit  1/24 Decompensated, tx to ICU, intubated 1/25 b/l chest tubes placed 1/25 CVC 2/03 flomax started 2/06 rt chest tube connection changed 2/09 Still no BM. Afebrile.  Left chest tube connection changed 2/10 Palliative care meeting  -> full code, full aggressive care including trach, lact. 40% fio2, 10 peep.  Currently on propofol and fentanyl infusion.  Not on pressors.  On tube feeds.  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 afbrile snce 2/7. TGL improved to 161 2/16 Trach 2/17 Chest tubes removed 2/18 witnessed aspiration 2/19 Cortrak dislodged / removed 2/21 woke up  Consults:  Neurology  Palliative care  Procedures:  ETT 1/24 >> 2/16 Bilateral chest tube placement 1/25 >> 2/17 R Femoral TLC 1/25 >> 2/4 RUE PICC 2/4 >> Trach 2/16 >>   Significant Diagnostic Tests:  CT Head 2/2 >> no acute intracranial hemorrhage, large area of infarct in the left occipital lobe, likely subacute CTA Chest 2/3 >> LLL segmental PE, no CT evidence of right heart strain, multifocal PNA, background emphysema and probable degree of interstitial fibrosis ECHO 2/3 >> LVEF 55-60%, no WMA, hypokinetic right ventricular free wall,  McConnells sign present. RV findings consistent with acute cor pulmonale, small pericardial effusion, mildly elevated pulmonary artery systolic pressure  Micro Data:  COVID 1/12 >> positive  PCT 1/23 >> Neg Blood cx 2/5 >> negative Sputum 2/6 >> abundant diphtheroids Corynebacterium species  Sputum 2/18 >> normal flora  Antimicrobials:  Remdesivir 1/23>>1/28 Dexamethasone 1/23 >> 2/2  Vanc 2/5 >> 2/12  Zosyn 2/5 >> 2/9 Cefepime 2/9 >> 2/11 Unasyn 2/18 >> 2/23 (planned)  Interim history/subjective:  Vomited yesterday during TC trial. Now back on TC Denies pain.  Objective   Blood pressure (!) 132/94, pulse (!) 101, temperature 98.5 F (36.9 C), temperature source Oral, resp. rate 18, height 5\' 6"  (1.676 m), weight 71.5 kg, SpO2 97 %.    Vent Mode: CPAP;PSV FiO2 (%):  [40 %] 40 % Set Rate:  [22 bmp] 22 bmp Vt Set:  [510 mL] 510 mL PEEP:  [5 cmH20-8 cmH20] 5 cmH20 Pressure Support:  [8 cmH20-10 cmH20] 10 cmH20 Plateau Pressure:  [16 cmH20] 16 cmH20   Intake/Output Summary (Last 24 hours) at 03/09/2019 0819 Last data filed at 03/09/2019 0700 Gross per 24 hour  Intake 851.17 ml  Output 1575 ml  Net -723.83 ml   Filed Weights   03/07/19 0442 03/08/19 0420 03/09/19 0443  Weight: 83 kg 72.6 kg 71.5 kg   GEN: ill appearing man on vent HEENT: trach in place, CDI CV: Tachy, ext warm PULM: +rhonci, tachypneic GI: Soft, +BS EXT: No edema NEURO: now following commands PSYCH: RASS 0 SKIN: no rashes, toes ischemic   Resolved Hospital Problem list   Oliguria, hypernatremia Bilateral  pneumothoraces Ventilator associated pneumonia  Assessment & Plan:   Acute hypoxemic respiratory failure due to COVID-19 pneumonia, ARDS, PE, left lower lobe pneumonia -TC trial, if tolerates x 24h switch to cuffless in am -remove trach sutures today -follow intermittent CXR  -5 days unasyn for potential aspiration PNA (finish 1/24)  Acute Metabolic Encephalopathy  All sedating meds DC'd  except PRNs  Pulmonary embolism -lovenox to resume after PEG -switch to NoAC closer to DC  Aortic clot Ischemic Foot Changes  -lovenox as above  -follow toes / wounds   Acute right heart failure -hold diuretics, diuresing well on own -replete K  Left occipital and right frontal CVA -supportive measures  -PT/OT efforts as able, passive ROM  -lipitor  -PEG today  COVID-19 Completed decadron, remdesivir  -follow / supportive care   Steroid-induced hyperglycemia -Levemir and TF coverage   Best practice:  Diet: TF Pain/Anxiety/Delirium protocol (if indicated): see above VAP protocol (if indicated): Ordered DVT prophylaxis: lovenox Rx dose of 75mg  bid with pharmacy monitoring GI prophylaxis: Pepcid Glucose control: SSI and Levemir Mobility: Bedrest Code Status: Full Code per Pall care 2/10  Family Communication: wife visiting daily Disposition: ICU pending vent liberation  The patient is critically ill with multiple organ systems failure and requires high complexity decision making for assessment and support, frequent evaluation and titration of therapies, application of advanced monitoring technologies and extensive interpretation of multiple databases. Critical Care Time devoted to patient care services described in this note independent of APP/resident time (if applicable)  is 35 minutes.   Erskine Emery MD [facility] 03/09/2019 8:19 AM Personal pager: [phone] If unanswered, please page CCM On-call: [phone]

## 2019-03-09 NOTE — Evaluation (Signed)
Passy-Muir Speaking Valve - Evaluation Patient Details  Name: Andrew Bruce MRN: [id number] Date of Birth: 1965-06-19  Today's Date: 03/09/2019 Time: 0922-0939 SLP Time Calculation (min) (ACUTE ONLY): 17 min  Past Medical History: History reviewed. No pertinent past medical history. Past Surgical History: History reviewed. No pertinent surgical history. HPI:  [name] without a past medical history admitted 1/23 with 48 hours of worsening shortness of breath in the context of a 10-day Covid illness (dry cough, lost taste/smell). Tested positive for Covid 01/26/19. Decompensated requiring intubation 1/24 and trach'd 2/16. 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 Additional smaller right frontal convexity infarct also noted, bilateral pneumothoraces & aortic clot. Pt has developed gangrene of toes as well.     Assessment / Plan / Recommendation Clinical Impression  Pt was awake this morning and on trach collar during assessment for Passy-Muir speaking valve. Pt's cuff was deflated upon arrival- no secretions detected or audible congestion. Valve remained in place entire evaluation without evidence of air trapping and RR 18, SpO2 94% and HR in 90's. Pt has expressive aphasia in addition to suboptimal respiratroy support without audible vocalization. Spanish speaking RN present to assist and pt demonstrated intermittent approximations of sounds/words from labial movements. SLP provided visual and verbal feedback to facilitate language and vocalization. Pt may wear valve with RN, RT or therapy if desired with FULL observation.      SLP Visit Diagnosis: Aphonia (R49.1)    SLP Assessment  Patient needs continued Speech Lanaguage Pathology Services    Follow Up Recommendations  24 hour supervision/assistance    Frequency and Duration min 2x/week  2 weeks    PMSV Trial  PMSV was placed for: 15 min Able to redirect subglottic air through upper airway: Yes Able to Attain Phonation: No Able to Expectorate Secretions: No attempts Intelligibility: Unable to assess (comment) Respirations During Trial: 18 SpO2 During Trial: 94 % Pulse During Trial: 101 Behavior: Alert;Controlled;Cooperative;Poor eye contact   Tracheostomy Tube       Vent Dependency  Vent Mode: CPAP;PSV PEEP: 5 cmH20 Pressure Support: 10 cmH20 FiO2 (%): 40 %    Cuff Deflation Trial  GO Tolerated Cuff Deflation: (deflated at baseline) Behavior: Alert;Controlled;Cooperative        Royce Macadamia 03/09/2019, 10:21 AM  Breck Coons Lonell Face.Ed Nurse, children's [phone] Office [phone]

## 2019-03-09 NOTE — Progress Notes (Signed)
Spoke with pt RN to notify her that we will have to postpone pt procedure until tomorrow 2/24. She states that she will notify pt wife. I advised that we will callfor pt tomorrow as schedule allows. PA to change orders.

## 2019-03-09 NOTE — Progress Notes (Signed)
Unfortunately IR unable to accommodate gastrostomy placement tentatively planned for today due to emergent procedures.  Will plan for gastrostomy placement tomorrow in IR with Dr. Loreta Ave - may resume tube feeds until midnight, hold Lovenox until post-procedure, IR will call for patient when ready.  Please call with any questions or concerns.  Lynnette Caffey, PA-C

## 2019-03-09 NOTE — Progress Notes (Signed)
Inpatient Diabetes Program Recommendations  AACE/ADA: New Consensus Statement on Inpatient Glycemic Control (2015)  Target Ranges:  Prepandial:   less than 140 mg/dL      Peak postprandial:   less than 180 mg/dL (1-2 hours)      Critically ill patients:  140 - 180 mg/dL   Lab Results  Component Value Date   GLUCAP 93 03/09/2019   HGBA1C 11.7 (H) 02/17/2019    Review of Glycemic Control Results for Andrew Bruce, Andrew Bruce (MRN [id number]) as of 03/09/2019 11:44  Ref. Range 03/08/2019 19:19 03/08/2019 23:22 03/09/2019 03:29 03/09/2019 07:31 03/09/2019 11:30  Glucose-Capillary Latest Ref Range: 70 - 99 mg/dL 060 (H) 156 (H) 95 92 93   Inpatient Diabetes Program Recommendations:   Fasting CBG 93. Please consider: -Decrease Levemir to 10 units bid -Decrease Novolog correction to moderate  Thank you, Darel Hong E. Carlos Quackenbush, RN, MSN, CDE  Diabetes Coordinator Inpatient Glycemic Control Team Team Pager [phone] (8am-5pm) 03/09/2019 11:45 AM

## 2019-03-10 ENCOUNTER — Inpatient Hospital Stay (HOSPITAL_COMMUNITY): Payer: HRSA Program

## 2019-03-10 HISTORY — PX: IR GASTROSTOMY TUBE MOD SED: IMG625

## 2019-03-10 LAB — BASIC METABOLIC PANEL
Anion gap: 11 (ref 5–15)
BUN: 19 mg/dL (ref 6–20)
CO2: 26 mmol/L (ref 22–32)
Calcium: 8.6 mg/dL — ABNORMAL LOW (ref 8.9–10.3)
Chloride: 103 mmol/L (ref 98–111)
Creatinine, Ser: 0.49 mg/dL — ABNORMAL LOW (ref 0.61–1.24)
GFR calc Af Amer: >60 mL/min (ref >60)
GFR calc non Af Amer: >60 mL/min (ref >60)
Glucose, Bld: 134 mg/dL — ABNORMAL HIGH (ref 70–99)
Potassium: 3.2 mmol/L — ABNORMAL LOW (ref 3.5–5.1)
Sodium: 140 mmol/L (ref 135–145)

## 2019-03-10 LAB — GLUCOSE, CAPILLARY
Glucose-Capillary: 132 mg/dL — ABNORMAL HIGH (ref 70–99)
Glucose-Capillary: 111 mg/dL — ABNORMAL HIGH (ref 70–99)
Glucose-Capillary: 119 mg/dL — ABNORMAL HIGH (ref 70–99)
Glucose-Capillary: 131 mg/dL — ABNORMAL HIGH (ref 70–99)
Glucose-Capillary: 82 mg/dL (ref 70–99)

## 2019-03-10 MED ORDER — FENTANYL CITRATE (PF) 100 MCG/2ML IJ SOLN
INTRAMUSCULAR | Status: AC
  Filled 2019-03-10: qty 2

## 2019-03-10 MED ORDER — POTASSIUM CHLORIDE 10 MEQ/50ML IV SOLN
10.0000 meq | INTRAVENOUS | Status: AC
  Administered 2019-03-10 (×3): 10 meq via INTRAVENOUS
  Filled 2019-03-10 (×3): qty 50

## 2019-03-10 MED ORDER — MIDAZOLAM HCL 2 MG/2ML IJ SOLN
INTRAMUSCULAR | Status: AC
  Filled 2019-03-10: qty 2

## 2019-03-10 MED ORDER — LIDOCAINE HCL (PF) 1 % IJ SOLN
INTRAMUSCULAR | Status: AC | PRN
  Administered 2019-03-10: 10 mL

## 2019-03-10 MED ORDER — POTASSIUM CHLORIDE 10 MEQ/100ML IV SOLN: 10.0000 meq | INTRAVENOUS | Status: DC

## 2019-03-10 MED ORDER — IOHEXOL 300 MG/ML  SOLN
50.0000 mL | Freq: Once | INTRAMUSCULAR | Status: AC | PRN
  Administered 2019-03-10: 18:00:00 15 mL

## 2019-03-10 MED ORDER — LIDOCAINE HCL 1 % IJ SOLN
INTRAMUSCULAR | Status: AC
  Filled 2019-03-10: qty 20

## 2019-03-10 MED ORDER — MIDAZOLAM HCL 2 MG/2ML IJ SOLN
INTRAMUSCULAR | Status: AC | PRN
  Administered 2019-03-10: 0.5 mg via INTRAVENOUS

## 2019-03-10 MED ORDER — CEFAZOLIN SODIUM-DEXTROSE 2-4 GM/100ML-% IV SOLN
2.0000 g | Freq: Once | INTRAVENOUS | Status: AC
  Filled 2019-03-10: qty 100

## 2019-03-10 MED ORDER — CEFAZOLIN SODIUM-DEXTROSE 2-4 GM/100ML-% IV SOLN
INTRAVENOUS | Status: AC
  Filled 2019-03-10: qty 100

## 2019-03-10 NOTE — Progress Notes (Signed)
  Speech Language Pathology Treatment: [name] Speaking valve  Patient Details Name: Andrew Bruce MRN: [id number] DOB: Feb 20, 1965 Today's Date: 03/10/2019 Time: 1443-1540 SLP Time Calculation (min) (ACUTE ONLY): 13 min  Assessment / Plan / Recommendation Clinical Impression  Pt alert, appears more rested today. He is scheduled for PEG this am and not able to have BSE. Since admission pt was not responsive adequately for swallow assessment and PEG placement had been scheduled. He began to sustain alertness yesterday and discussions to possibly defer PEG for po assessment/intake but MD wanted to proceed with PEG (will not likely meet nutritional needs and may need supplementation longer than Cortrak can provide).  Mucous present on gauze and chest demonstrating pt's ability to expectorate via trach with adequate cough. Valve was donned for approximately 10 minutes with vital signs in normal range, no distress and no evidence of air trapping. Pt has expressive aphasia however mildly ncreased attempts to approximate words today with barely audible phonation detected x 1 not replicated with visual support from therapist. Requires continued intervention to synchronize speech and respirations. He followed 2 commands in Spanish (from therapist) however needs full language-cognitive assessment and intervention. RT arrived to work with pt prior to leaving unit for PEG. Therapy will continue for use of upper airway, swallow assessment and SLE (once obtained).    HPI HPI: Andrew Bruce without a past medical history admitted 1/23 with 48 hours of worsening shortness of breath in the context of a 10-day Covid illness (dry cough, lost taste/smell). Tested positive for Covid 01/26/19. Decompensated requiring intubation 1/24 and trach'd 2/16. 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 Additional  smaller right frontal convexity infarct also noted, bilateral pneumothoraces & aortic clot. Pt has developed gangrene of toes as well.        SLP Plan  Continue with current plan of care       Recommendations         Patient may use Passy-Muir Speech Valve: During all therapies with supervision PMSV Supervision: Full MD: Please consider changing trach tube to : Smaller size;Cuffless         Oral Care Recommendations: Oral care QID Follow up Recommendations: 24 hour supervision/assistance SLP Visit Diagnosis: Aphonia (R49.1) Plan: Continue with current plan of care                       Royce Macadamia 03/10/2019, 11:50 AM  Breck Coons Lonell Face.Ed Nurse, children's [phone] Office [phone]

## 2019-03-10 NOTE — Progress Notes (Signed)
Patient tolerating trach mask. Is NPO for peg tube procedure and also bc he has no po access. MDs aware. PO meds not given because of this. Dressing to coccyx changed by this RN during his PM bath. Site with a scant amount of drainage. Turning q2H left/right to keep off coccyx.

## 2019-03-10 NOTE — Procedures (Signed)
Interventional Radiology Procedure Note  Procedure: Gastrostomy tube placement  Complications: None  Estimated Blood Loss: < 10 mL  Findings: 20 Fr bumper retention gastrostomy tube placed with tip in body of stomach. OK to use in 24 hours.  Andrew Bruce T. Shavy Beachem, M.D Pager:  [phone]   

## 2019-03-10 NOTE — Progress Notes (Signed)
CSW faxed patient's clinical information to [facility] for review.  Edwin Dada, MSW, LCSW-A Transitions of Care  Clinical Social Worker  [facility] Emergency Departments  Medical ICU [phone]

## 2019-03-10 NOTE — Progress Notes (Signed)
NAME:  Andrew Bruce, MRN:  [id number], DOB:  [birth year], LOS: 32 ADMISSION DATE:  02/06/2019, CONSULTATION DATE:  02/06/19 REFERRING MD:  ER, CHIEF COMPLAINT:  SOB   Brief History   [name] without a past medical history admitted 1/23 with 48 hours of worsening shortness of breath in the context of a 10-day Covid illness (dry cough, lost taste/smell).  Chest x-ray and vitals are consistent with a worsening COVID ARDS.  Admitted for evaluation, developed worsening shortness of breath and increasing O2 needs. Decompensated requiring intubation 1/24.  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   Past Medical History  None known prior to admit   Significant Hospital Events   1/23 Admit  1/24 Decompensated, tx to ICU, intubated 1/25 b/l chest tubes placed 1/25 CVC 2/03 flomax started 2/06 rt chest tube connection changed 2/09 Still no BM. Afebrile.  Left chest tube connection changed 2/10 Palliative care meeting  -> full code, full aggressive care including trach, lact. 40% fio2, 10 peep.  Currently on propofol and fentanyl infusion.  Not on pressors.  On tube feeds.  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 afbrile snce 2/7. TGL improved to 161 2/16 Trach 2/17 Chest tubes removed 2/18 witnessed aspiration 2/19 Cortrak dislodged / removed 2/21 woke up  Consults:  Neurology  Palliative care  Procedures:  ETT 1/24 >> 2/16 Bilateral chest tube placement 1/25 >> 2/17 R Femoral TLC 1/25 >> 2/4 RUE PICC 2/4 >> Trach 2/16 >>   Significant Diagnostic Tests:  CT Head 2/2 >> no acute intracranial hemorrhage, large area of infarct in the left occipital lobe, likely subacute CTA Chest 2/3 >> LLL segmental PE, no CT evidence of right heart strain, multifocal PNA, background emphysema and probable degree of interstitial fibrosis ECHO 2/3 >> LVEF 55-60%, no WMA, hypokinetic right ventricular free wall,  McConnells sign present. RV findings consistent with acute cor pulmonale, small pericardial effusion, mildly elevated pulmonary artery systolic pressure  Micro Data:  COVID 1/12 >> positive  PCT 1/23 >> Neg Blood cx 2/5 >> negative Sputum 2/6 >> abundant diphtheroids Corynebacterium species  Sputum 2/18 >> normal flora  Antimicrobials:  Remdesivir 1/23>>1/28 Dexamethasone 1/23 >> 2/2  Vanc 2/5 >> 2/12  Zosyn 2/5 >> 2/9 Cefepime 2/9 >> 2/11 Unasyn 2/18 >> 2/23 (planned)  Interim history/subjective:  Appears comfortable   Objective   Blood pressure (Abnormal) 147/94, pulse (Abnormal) 103, temperature 97.6 F (36.4 C), temperature source Oral, resp. rate (Abnormal) 23, height 5\' 6"  (1.676 m), weight 71.7 kg, SpO2 95 %.    FiO2 (%):  [28 %-40 %] 28 %   Intake/Output Summary (Last 24 hours) at 03/10/2019 0932 Last data filed at 03/10/2019 0900 Gross per 24 hour  Intake 218.32 ml  Output 1500 ml  Net -1281.68 ml   Filed Weights   03/08/19 0420 03/09/19 0443 03/10/19 0336  Weight: 72.6 kg 71.5 kg 71.7 kg   General this is a 54 year old male resting in bed. Not in acute distress. Very debilitated after prolonged critical illness.  HENT NCAT 6 cuffed trach in place. Yellow tinged mucous pulm 28% ATC. No accessory use crackles both bases  Card RRR no MRG abd not tender  Ext warm ischemic changes to toes. No edema Neuro awake follows commands   Resolved Hospital Problem list   Oliguria, hypernatremia Bilateral pneumothoraces Ventilator associated pneumonia  Assessment & Plan:   Acute hypoxemic respiratory failure due to COVID-19 pneumonia, ARDS,  PE, left lower lobe pneumonia->now trach dependent -Completed 5 d unasyn Plan Cont atc Routine trach care Change to cuffless either this afternoon (after  PEG) or in am  Encourage SLP after PEG   Acute Metabolic Encephalopathy  All sedating meds DC'd except PRNs Plan Supportive care   Pulmonary embolism Plan Resume LMWH  after PEG Change to NoAC as we get closer to dc   Aortic clot Ischemic Foot Changes  Plan Cont LMWH Follow wounds clinically   Acute right heart failure Plan Avoid hypoxia Pulse ox   Hypokalemia Plan Replace and recheck in am   Left occipital and right frontal CVA -supportive measures  Plan Cont PT/OT w/ passive RON Cont Lipitor  For PEG placement today    COVID-19 Completed decadron, remdesivir  Plan Cont supportive care   Steroid-induced hyperglycemia Plan Cont ssi and current levemir   Best practice:  Diet: TF Pain/Anxiety/Delirium protocol (if indicated): see above VAP protocol (if indicated): Ordered DVT prophylaxis: lovenox Rx dose of 75mg  bid with pharmacy monitoring GI prophylaxis: Pepcid Glucose control: SSI and Levemir Mobility: Bedrest Code Status: Full Code per Pall care 2/10  Family Communication: wife visiting daily Disposition can move out of ICU after PEG completed and he is recovered  Erick Colace ACNP-BC [place] Pager # [phone] OR # [phone] if no answer

## 2019-03-11 LAB — BASIC METABOLIC PANEL
Anion gap: 13 (ref 5–15)
BUN: 18 mg/dL (ref 6–20)
CO2: 24 mmol/L (ref 22–32)
Calcium: 8.7 mg/dL — ABNORMAL LOW (ref 8.9–10.3)
Chloride: 104 mmol/L (ref 98–111)
Creatinine, Ser: 0.63 mg/dL (ref 0.61–1.24)
GFR calc Af Amer: >60 mL/min (ref >60)
GFR calc non Af Amer: >60 mL/min (ref >60)
Glucose, Bld: 87 mg/dL (ref 70–99)
Potassium: 3.2 mmol/L — ABNORMAL LOW (ref 3.5–5.1)
Sodium: 141 mmol/L (ref 135–145)

## 2019-03-11 LAB — GLUCOSE, CAPILLARY
Glucose-Capillary: 93 mg/dL (ref 70–99)
Glucose-Capillary: 86 mg/dL (ref 70–99)
Glucose-Capillary: 75 mg/dL (ref 70–99)
Glucose-Capillary: 133 mg/dL — ABNORMAL HIGH (ref 70–99)
Glucose-Capillary: 90 mg/dL (ref 70–99)
Glucose-Capillary: 189 mg/dL — ABNORMAL HIGH (ref 70–99)

## 2019-03-11 MED ORDER — OSMOLITE 1.5 CAL PO LIQD
1000.0000 mL | ORAL | Status: DC
  Administered 2019-03-11 – 2019-03-23 (×9): 1000 mL
  Filled 2019-03-11 (×21): qty 1000

## 2019-03-11 MED ORDER — PRO-STAT SUGAR FREE PO LIQD
30.0000 mL | Freq: Two times a day (BID) | ORAL | Status: DC
  Administered 2019-03-11 – 2019-03-17 (×12): 30 mL
  Filled 2019-03-11 (×12): qty 30

## 2019-03-11 MED ORDER — POTASSIUM CHLORIDE 20 MEQ/15ML (10%) PO SOLN
40.0000 meq | Freq: Two times a day (BID) | ORAL | Status: AC
  Administered 2019-03-11 (×2): 40 meq
  Filled 2019-03-11 (×2): qty 30

## 2019-03-11 MED ORDER — DOXAZOSIN MESYLATE 2 MG PO TABS
1.0000 mg | ORAL_TABLET | Freq: Every day | ORAL | Status: DC
  Administered 2019-03-11 – 2019-03-23 (×13): 1 mg
  Filled 2019-03-11 (×14): qty 1

## 2019-03-11 MED ORDER — GUAIFENESIN ER 600 MG PO TB12
600.0000 mg | ORAL_TABLET | Freq: Two times a day (BID) | ORAL | Status: DC | PRN
  Filled 2019-03-11: qty 1

## 2019-03-11 MED ORDER — APIXABAN 5 MG PO TABS
5.0000 mg | ORAL_TABLET | Freq: Two times a day (BID) | ORAL | Status: DC
  Administered 2019-03-11 – 2019-03-23 (×24): 5 mg
  Filled 2019-03-11 (×24): qty 1

## 2019-03-11 MED ORDER — GUAIFENESIN 100 MG/5ML PO SOLN
15.0000 mL | Freq: Four times a day (QID) | ORAL | Status: DC | PRN
  Administered 2019-03-12: 300 mg via ORAL
  Filled 2019-03-11: qty 15

## 2019-03-11 NOTE — Progress Notes (Signed)
eLink Physician-Brief Progress Note Patient Name: Andrew Bruce DOB: 1965-06-27 MRN: [id number]   Date of Service  03/11/2019  HPI/Events of Note  Pt with viscid tracheal secretions.  eICU Interventions  PRN Mucinex ordered.        [name] 03/11/2019, 11:44 PM

## 2019-03-11 NOTE — Progress Notes (Addendum)
Nutrition Follow-up  DOCUMENTATION CODES:   Not applicable  INTERVENTION:   Resume tube feeding via PEG:   Osmolite 1.5 at 25 ml/h, increase by 10 ml every 4 hours to goal rate of 65 ml/hr Pro-Stat 30 mL BID  TF regimen at goal provides 2540 kcals, 128 g of protein and 1189 mL of free water  Continue Juven BID, each packet provides 80 calories, 8 grams of carbohydrate, 2.5  grams of protein (collagen), 7 grams of L-arginine and 7 grams of L-glutamine; supplement contains CaHMB, Vitamins C, E, B12 and Zinc to promote wound healing   NUTRITION DIAGNOSIS:   Inadequate oral intake related to acute illness as evidenced by NPO status.  Ongoing  GOAL:   Patient will meet greater than or equal to 90% of their needs  Met with TF  MONITOR:   TF tolerance, Vent status, Labs, Weight trends  ASSESSMENT:   54 yo male admitted with acute respiratory failure due to COVID-19 pneumonia, ARDS requiring intubation, shock. No PMH   1/23 Admit 1/24 Intubated 1/25 B/L chest tubes placed 2/02 CT head: multiple ischemic infarcts  2/16 S/P tracheostomy and Cortrak placement (tip in the stomach) 2/24 S/P PEG 2/25 TF being resumed  Patient is currently on trach collar.  SLP following for PMV use.  15% weight loss since admission.    Intake/Output Summary (Last 24 hours) at 03/11/2019 1408 Last data filed at 03/11/2019 0700 Gross per 24 hour  Intake 90.9 ml  Output 695 ml  Net -604.1 ml    Labs reviewed. K 3.2 (L) CBGs 93-86-75 Meds: MVI, Vit C, zinc sulfate, folic acid, ss novolog, novolog 2 U q 4 hours, levemir, Juven, Miralax, Senokot-S, thiamine.   Diet Order:   Diet Order    None      EDUCATION NEEDS:   Not appropriate for education at this time  Skin:  Skin Assessment: Skin Integrity Issues: Skin Integrity Issues:: Unstageable DTI: N/A Stage I: R ear Stage II: L ear Unstageable: coccyx Other: gangrene of toes  Last BM:  2/23  Height:   Ht Readings from  Last 1 Encounters:  02/06/19 5' 6"  (1.676 m)    Weight:   Wt Readings from Last 1 Encounters:  03/11/19 68.2 kg   Admission weight 80.3 kg (1/23)  BMI:  Body mass index is 24.27 kg/m.  Estimated Nutritional Needs:   Kcal:  9417-4081  Protein:  120-145 g  Fluid:  >/= 2 L   Molli Barrows, RD, LDN, CNSC Please refer to Amion for contact information.

## 2019-03-11 NOTE — Progress Notes (Signed)
NAME:  Andrew Bruce, MRN:  [id number], DOB:  04-Mar-1965, LOS: 33 ADMISSION DATE:  02/06/2019, CONSULTATION DATE:  02/06/19 REFERRING MD:  ER, CHIEF COMPLAINT:  SOB   Brief History   [name] without a past medical history admitted 1/23 with 48 hours of worsening shortness of breath in the context of a 10-day Covid illness (dry cough, lost taste/smell).  Chest x-ray and vitals are consistent with a worsening COVID ARDS.  Admitted for evaluation, developed worsening shortness of breath and increasing O2 needs. Decompensated requiring intubation 1/24.  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   Past Medical History  None known prior to admit   Significant Hospital Events   1/23 Admit  1/24 Decompensated, tx to ICU, intubated 1/25 b/l chest tubes placed 1/25 CVC 2/03 flomax started 2/06 rt chest tube connection changed 2/09 Still no BM. Afebrile.  Left chest tube connection changed 2/10 Palliative care meeting > FULL CODE.     2/16 Trach 2/17 Chest tubes removed 2/18 witnessed aspiration 2/19 Cortrak dislodged / removed 2/21 Woke up /  Mental status improved 2/25 10L/40% ATC, up to chair   Consults:  Neurology  Palliative care  Procedures:  ETT 1/24 >> 2/16 Bilateral chest tube placement 1/25 >> 2/17 R Femoral TLC 1/25 >> 2/4 RUE PICC 2/4 >> Trach 2/16 >>   Significant Diagnostic Tests:  CT Head 2/2 >> no acute intracranial hemorrhage, large area of infarct in the left occipital lobe, likely subacute CTA Chest 2/3 >> LLL segmental PE, no CT evidence of right heart strain, multifocal PNA, background emphysema and probable degree of interstitial fibrosis ECHO 2/3 >> LVEF 55-60%, no WMA, hypokinetic right ventricular free wall, McConnells sign present. RV findings consistent with acute cor pulmonale, small pericardial effusion, mildly elevated pulmonary artery systolic pressure  Micro Data:  COVID 1/12 >>  positive  PCT 1/23 >> Neg Blood cx 2/5 >> negative Sputum 2/6 >> abundant diphtheroids Corynebacterium species  Sputum 2/18 >> normal flora  Antimicrobials:  Remdesivir 1/23>>1/28 Dexamethasone 1/23 >> 2/2  Vanc 2/5 >> 2/12  Zosyn 2/5 >> 2/9 Cefepime 2/9 >> 2/11 Unasyn 2/18 >> 2/23    Interim history/subjective:  RN reports pt up to chair 0600.   Approved for PEG use per IR  Remains on 10L / 40% FiO2 RN reports pt will have episodes of increased secretions requiring suctioning / desaturations (not increased / unchanged)  Objective   Blood pressure (!) 146/91, pulse (!) 106, temperature 97.6 F (36.4 C), temperature source Oral, resp. rate (!) 25, height 5\' 6"  (1.676 m), weight 68.2 kg, SpO2 95 %.    Vent Mode: Stand-by FiO2 (%):  [28 %-40 %] 40 %   Intake/Output Summary (Last 24 hours) at 03/11/2019 1209 Last data filed at 03/11/2019 0700 Gross per 24 hour  Intake 177.53 ml  Output 795 ml  Net -617.47 ml   Filed Weights   03/09/19 0443 03/10/19 0336 03/11/19 0500  Weight: 71.5 kg 71.7 kg 68.2 kg   General: chronically ill appearing adult sitting up in chair in NAD HEENT: MM pink/moist, trach midline c/d/i Neuro: opens eyes, tracks provider, does not follow commands / shakes head no (unsure if due to language barrier), no spontaneous movement noted  CV: s1s2 RRR, no m/r/g PULM:  Non-labored on ATC 10L/40%, clear breath sounds bilaterally GI: soft, bsx4 active  Extremities: warm/dry, trace generalized edema  Skin: warm, no rashes, necrotic toes unchanged  Resolved Hospital Problem list  Oliguria, hypernatremia Bilateral pneumothoraces Ventilator associated pneumonia  Assessment & Plan:   Acute hypoxemic respiratory failure due to COVID-19 pneumonia, ARDS, PE, left lower lobe pneumonia Tracheostomy Dependence  Completed 5 days unasyn.  -trach care per protocol.  -change to cuffless trach once back in bed  -SLP efforts  -pulmonary hygiene- mobilize  Acute  Metabolic Encephalopathy  All sedating meds discontinued except PRNs -supportive care  -PT efforts   Pulmonary embolism -transition to eliquis per pharmacy   Aortic clot Ischemic Foot Changes  -continue eliquis  -follow feet clinically, no acute interventions at this time   Acute right heart failure -avoid hypoxia -goal euvolemia   Hypokalemia -follow BMP  -80 mEq KCL x1   Left occipital and right frontal CVA -supportive measures  -PT / OT efforts with passive ROM  -lipitor   COVID-19 Completed decadron, remdesivir  -no further acute interventions, supportive care   Steroid-induced hyperglycemia -SSI, levemir   Best practice:  Diet: TF Pain/Anxiety/Delirium protocol (if indicated): see above VAP protocol (if indicated): Ordered DVT prophylaxis: transition to Eliquis  GI prophylaxis: Pepcid Glucose control: SSI and Levemir Mobility: Bedrest Code Status: Full Code per Pall care 2/10  Family Communication: Will update wife on arrival 2/25 Disposition: To [place] 2/26, PCCM will continue to follow for trach care    Noe Gens, MSN, NP-C [facility] 03/11/2019, 12:09 PM   Please see [email or web address] for pager details.

## 2019-03-11 NOTE — Progress Notes (Signed)
Referring Physician(s): CCM  Supervising Physician: Corrie Mckusick  Patient Status:  [facility] - In-pt  Chief Complaint:  Aspiration risk  Subjective:  Perc G tube placed in IR 2/24 Tolerated well Up in chair   Allergies: Patient has no known allergies.  Medications: Prior to Admission medications   Not on File     Vital Signs: BP (!) 146/91   Pulse (!) 106   Temp 98.1 F (36.7 C) (Oral)   Resp (!) 25   Ht 5' 6"  (1.676 m)   Wt 150 lb 5.7 oz (68.2 kg)   SpO2 94%   BMI 24.27 kg/m   Physical Exam Skin:    General: Skin is warm and dry.     Comments: Site is clean and dry NT no bleeding +BS     Imaging: IR GASTROSTOMY TUBE MOD SED  Result Date: 03/11/2019 CLINICAL DATA:  Respiratory failure and need for percutaneous gastrostomy tube for nutrition. EXAM: PERCUTANEOUS GASTROSTOMY TUBE PLACEMENT ANESTHESIA/SEDATION: The patient was not formally given full conscious sedation due to high risk. He was given 0.5 mg of IV Versed. CONTRAST:  16m OMNIPAQUE IOHEXOL 300 MG/ML  SOLN MEDICATIONS: 2 g IV Ancef. IV antibiotic was administered in an appropriate time interval prior to needle puncture of the skin. FLUOROSCOPY TIME:  5 minutes and 42 seconds.  189.4 mGy. PROCEDURE: The procedure, risks, benefits, and alternatives were explained to the patient's wife. Questions regarding the procedure were encouraged and answered. The patient's wife understands and consents to the procedure. A 5-French catheter was then advanced through the patient's mouth under fluoroscopy into the esophagus and to the level of the stomach. This catheter was used to insufflate the stomach with air under fluoroscopy. The abdominal wall was prepped with chlorhexidine in a sterile fashion, and a sterile drape was applied covering the operative field. A sterile gown and sterile gloves were used for the procedure. Local anesthesia was provided with 1% Lidocaine. A skin incision was made in the upper abdominal  wall. Under fluoroscopy, an 18 gauge trocar needle was advanced into the stomach. Contrast injection was performed to confirm intraluminal position of the needle tip. A single T tack was then deployed in the lumen of the stomach. This was brought up to tension at the skin surface. Over a guidewire, a 9-French sheath was advanced into the lumen of the stomach. The wire was left in place as a safety wire. A loop snare device from a percutaneous gastrostomy kit was then advanced into the stomach. A floppy guide wire was advanced through the orogastric catheter under fluoroscopy in the stomach. The loop snare advanced through the percutaneous gastric access was used to snare the guide wire. This allowed withdrawal of the loop snare out of the patient's mouth by retraction of the orogastric catheter and wire. A 20-French bumper retention gastrostomy tube was looped around the snare device. It was then pulled back through the patient's mouth. The retention bumper was brought up to the anterior gastric wall. The T tack suture was cut at the skin. The exiting gastrostomy tube was cut to appropriate length and a feeding adapter applied. The catheter was injected with contrast material to confirm position and a fluoroscopic spot image saved. The tube was then flushed with saline. A dressing was applied over the gastrostomy exit site. COMPLICATIONS: None. FINDINGS: The stomach distended well with air allowing safe placement of the gastrostomy tube. After placement, the tip of the gastrostomy tube lies in the body of the  stomach. IMPRESSION: Percutaneous gastrostomy with placement of a 20-French bumper retention tube in the body of the stomach. This tube can be used for percutaneous feeds beginning in 24 hours after placement. Electronically Signed   By: Aletta Edouard M.D.   On: 03/11/2019 08:34    Labs:  CBC: Recent Labs    03/01/19 0446 03/01/19 0446 03/04/19 0617 03/04/19 1030 03/05/19 1041 03/06/19 0518  WBC  13.0*  --   --  22.3* 15.6* 13.5*  HGB 8.6*   < > 9.9* 8.5* 8.2* 8.6*  HCT 28.4*   < > 29.0* 27.7* 26.8* 28.3*  PLT 663*  --   --  637* 668* 702*   < > = values in this interval not displayed.    COAGS: Recent Labs    02/26/19 0342 03/02/19 0003  INR 1.1 1.1    BMP: Recent Labs    03/08/19 0730 03/09/19 0500 03/10/19 0327 03/11/19 0352  NA 140 141 140 141  K 3.2* 3.5 3.2* 3.2*  CL 100 104 103 104  CO2 28 27 26 24   GLUCOSE 113* 98 134* 87  BUN 15 17 19 18   CALCIUM 8.5* 8.7* 8.6* 8.7*  CREATININE 0.32* 0.47* 0.49* 0.63  GFRNONAA >60 >60 >60 >60  GFRAA >60 >60 >60 >60    LIVER FUNCTION TESTS: Recent Labs    02/11/19 0451 02/12/19 0623 02/25/19 0500 02/26/19 0342  BILITOT 0.8 0.4 0.4 0.5  AST 20 21 31 26   ALT 25 28 31 27   ALKPHOS 53 63 119 110  PROT 5.2* 5.4* 5.9* 5.4*  ALBUMIN 2.6* 2.6* 1.4* 1.3*    Assessment and Plan:  Perc G tube in place May use now  Electronically Signed: Lavonia Drafts, PA-C 03/11/2019, 11:09 AM   I spent a total of 15 Minutes at the the patient's bedside AND on the patient's hospital floor or unit, greater than 50% of which was counseling/coordinating care for G tube placement

## 2019-03-11 NOTE — Progress Notes (Signed)
Trach changed by RT x 2.  Site cleaned and Drawtex dressing applied.  Pt tolerated procedure well, showing no signs of distress.  Trach position verified by positive color change on ETCO2.  Pt currently resting comfortably.

## 2019-03-11 NOTE — Progress Notes (Signed)
CSW spoke with Antony Contras at [facility] in [place], [place] to request her review of this patient. Patient's clinicals were faxed for review.  Edwin Dada, MSW, LCSW-A Transitions of Care  Clinical Social Worker  [facility]  Medical ICU [phone]

## 2019-03-12 DIAGNOSIS — L89309 Pressure ulcer of unspecified buttock, unspecified stage: Secondary | ICD-10-CM

## 2019-03-12 LAB — BASIC METABOLIC PANEL WITH GFR
Anion gap: 9 (ref 5–15)
BUN: 26 mg/dL — ABNORMAL HIGH (ref 6–20)
CO2: 25 mmol/L (ref 22–32)
Calcium: 8.6 mg/dL — ABNORMAL LOW (ref 8.9–10.3)
Chloride: 110 mmol/L (ref 98–111)
Creatinine, Ser: 0.66 mg/dL (ref 0.61–1.24)
GFR calc Af Amer: >60 mL/min (ref >60)
GFR calc non Af Amer: >60 mL/min (ref >60)
Glucose, Bld: 254 mg/dL — ABNORMAL HIGH (ref 70–99)
Potassium: 4 mmol/L (ref 3.5–5.1)
Sodium: 144 mmol/L (ref 135–145)

## 2019-03-12 LAB — GLUCOSE, CAPILLARY
Glucose-Capillary: 275 mg/dL — ABNORMAL HIGH (ref 70–99)
Glucose-Capillary: 222 mg/dL — ABNORMAL HIGH (ref 70–99)
Glucose-Capillary: 320 mg/dL — ABNORMAL HIGH (ref 70–99)
Glucose-Capillary: 91 mg/dL (ref 70–99)
Glucose-Capillary: 234 mg/dL — ABNORMAL HIGH (ref 70–99)

## 2019-03-12 MED ORDER — FENTANYL CITRATE (PF) 100 MCG/2ML IJ SOLN
12.5000 ug | INTRAMUSCULAR | Status: DC | PRN
  Administered 2019-03-12 (×2): 12.5 ug via INTRAVENOUS
  Administered 2019-03-17: 25 ug via INTRAVENOUS
  Administered 2019-03-17: 12.5 ug via INTRAVENOUS
  Administered 2019-03-18 – 2019-03-20 (×2): 25 ug via INTRAVENOUS
  Administered 2019-03-20: 12.5 ug via INTRAVENOUS
  Administered 2019-03-23: 25 ug via INTRAVENOUS
  Filled 2019-03-12 (×8): qty 2

## 2019-03-12 NOTE — Evaluation (Signed)
Clinical/Bedside Swallow Evaluation Patient Details  Name: Shi Blankenship MRN: [id number] Date of Birth: [birth year]  Today's Date: 03/12/2019 Time: SLP Start Time (ACUTE ONLY): 0900 SLP Stop Time (ACUTE ONLY): 0930 SLP Time Calculation (min) (ACUTE ONLY): 30 min  Past Medical History: History reviewed. No pertinent past medical history. Past Surgical History:  Past Surgical History:  Procedure Laterality Date  . IR GASTROSTOMY TUBE MOD SED  03/10/2019   HPI:  [name] without a past medical history admitted 1/23 with 48 hours of worsening shortness of breath in the context of a 10-day Covid illness (dry cough, lost taste/smell). Tested positive for Covid 01/26/19. Decompensated requiring intubation 1/24 and trach'd 2/16. 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 Additional smaller right frontal convexity infarct also noted, bilateral pneumothoraces & aortic clot. Pt has developed gangrene of toes as well.     Assessment / Plan / Recommendation Clinical Impression  Pt demonstrates excellent potential for PO intake. He was seen with PMSV in place, alert eager to try PO and repeatedly reporting how delicious it all is. Oral phase and swallow reponse are present, but bites of ice are followed by coughing. Suspect standing secretions may also be a potential issue. Pt would benefit from instrumental testing to determine best diet and course of treatment. SLP Visit Diagnosis: Dysphagia, oropharyngeal phase (R13.12)    Aspiration Risk  Severe aspiration risk    Diet Recommendation NPO        Other  Recommendations Oral Care Recommendations: Oral care BID Other Recommendations: Have oral suction available   Follow up Recommendations 24 hour supervision/assistance      Frequency and Duration            Prognosis        Swallow Study   General HPI: 54 year old  Spanish-speaking male without a past medical history admitted 1/23 with 48 hours of worsening shortness of breath in the context of a 10-day Covid illness (dry cough, lost taste/smell). Tested positive for Covid 01/26/19. Decompensated requiring intubation 1/24 and trach'd 2/16. 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 Additional smaller right frontal convexity infarct also noted, bilateral pneumothoraces & aortic clot. Pt has developed gangrene of toes as well.   Type of Study: Bedside Swallow Evaluation Diet Prior to this Study: NPO;PEG tube Temperature Spikes Noted: No Respiratory Status: Trach Collar History of Recent Intubation: Yes Length of Intubations (days): (24) Date extubated: 02/22/19 Behavior/Cognition: Alert;Cooperative Oral Cavity Assessment: Within Functional Limits Oral Care Completed by SLP: No Oral Cavity - Dentition: Adequate natural dentition Self-Feeding Abilities: Total assist Patient Positioning: Upright in chair Baseline Vocal Quality: Aphonic Volitional Cough: Weak Volitional Swallow: Able to elicit    Oral/Motor/Sensory Function Overall Oral Motor/Sensory Function: Within functional limits   Ice Chips Ice chips: Impaired Presentation: Spoon Pharyngeal Phase Impairments: Cough - Immediate   Thin Liquid Thin Liquid: Not tested    Nectar Thick Nectar Thick Liquid: Not tested   Honey Thick Honey Thick Liquid: Not tested   Puree Puree: Within functional limits   Solid           [name], MA CCC-SLP  Acute Rehabilitation Services Pager [phone] Office [phone]  [name] 03/12/2019,11:56 AM

## 2019-03-12 NOTE — Progress Notes (Signed)
eLink Physician-Brief Progress Note Patient Name: Andrew Bruce DOB: February 23, 1965 MRN: [id number]   Date of Service  03/12/2019  HPI/Events of Note  Pt needs a BMET order.  eICU Interventions  BMET ordered.        [name] 03/12/2019, 5:17 AM

## 2019-03-12 NOTE — Progress Notes (Signed)
Palliative:  I met today at Andrew Bruce's bedside. No family at bedside but he is working with SLP. He is alert and interactive. Able to follow commands. Working with SLP but secretions seem problematic. He is asking for water. No questions or concerns.   Palliative to follow peripherally for now but unlikely to re-engage unless he declines further. Please call [phone] for further palliative follow up.   No charge   , NP Palliative Medicine Team Pager [phone] (Please see [email or web address] for schedule) Team Phone [phone]   

## 2019-03-12 NOTE — Progress Notes (Addendum)
  Speech Language Pathology Treatment: [name] Speaking valve  Patient Details Name: Andrew Bruce MRN: [id number] DOB: 02-06-1965 Today's Date: 03/12/2019 Time: 0900-0930 SLP Time Calculation (min) (ACUTE ONLY): 30 min  Assessment / Plan / Recommendation Clinical Impression  Pt demonstrates tolerance of PMSV placement without signs of trapping or change in vitals (sats drop with activity, but not PMSV), but continues to demonstrate severe dysphonia. After repositioning, pt did have some instances of very slight audible phonation. He clearly has minimal diaphragmatic participation for deep breathing, visibly uses accessory muscles. Pt may wear PMSV with supervision from staff/therapists. Will need ongoing interventions.    HPI HPI: [name] without a past medical history admitted 1/23 with 48 hours of worsening shortness of breath in the context of a 10-day Covid illness (dry cough, lost taste/smell). Tested positive for Covid 01/26/19. Decompensated requiring intubation 1/24 and trach'd 2/16. 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 Additional smaller right frontal convexity infarct also noted, bilateral pneumothoraces & aortic clot. Pt has developed gangrene of toes as well.        SLP Plan  Continue with current plan of care       Recommendations         Patient may use Passy-Muir Speech Valve: During all therapies with supervision;Intermittently with supervision PMSV Supervision: Full         Oral Care Recommendations: Oral care BID Follow up Recommendations: 24 hour supervision/assistance SLP Visit Diagnosis: Aphonia (R49.1) Plan: Continue with current plan of care       GO               [name], MA CCC-SLP  Acute Rehabilitation Services Pager [phone] Office [phone]  [name] 03/12/2019, 11:33 AM

## 2019-03-12 NOTE — Evaluation (Signed)
Speech Language Pathology Evaluation Patient Details Name: Andrew Bruce MRN: [id number] DOB: April 26, 1965 Today's Date: 03/12/2019 Time: 0900-0930 SLP Time Calculation (min) (ACUTE ONLY): 30 min  Problem List:  Patient Active Problem List   Diagnosis Date Noted  . Pressure injury of skin 03/02/2019  . On mechanically assisted ventilation (HCC)   . Goals of care, counseling/discussion   . Palliative care encounter   . ARDS (adult respiratory distress syndrome) (HCC)   . Cerebral embolism with cerebral infarction 02/17/2019  . Acute respiratory failure with hypoxia (HCC)   . Pneumothorax, right   . COVID-19 02/06/2019   Past Medical History: History reviewed. No pertinent past medical history. Past Surgical History:  Past Surgical History:  Procedure Laterality Date  . IR GASTROSTOMY TUBE MOD SED  03/10/2019   HPI:  [name] without a past medical history admitted 1/23 with 48 hours of worsening shortness of breath in the context of a 10-day Covid illness (dry cough, lost taste/smell). Tested positive for Covid 01/26/19. Decompensated requiring intubation 1/24 and trach'd 2/16. 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 Additional smaller right frontal convexity infarct also noted, bilateral pneumothoraces & aortic clot. Pt has developed gangrene of toes as well.     Assessment / Plan / Recommendation Clinical Impression  Pt demosntrates much improved cognitive and language abitlities today. He is alert with PMSV in place but aphonic. However, with orientation questions, pt able to clearly articulate with accuracy including his name, 'hospital', 'Andrew Bruce' and several other accurate responses in short words and phrases. He is able to direct gaze to right, but clearly has a visual deficit in the right field and endorsed trouble seeing. He uses his left hand to  compensate for right handed weakness and uses it for communicative gestures. Given that auditory comprehension is so good, but vocal quality is so poor, assessing for degree of expressive aphasia present is difficult. Suspect at least some word finding impairment based on pts difficulty stating the month, using his hands to gesture 2 for February and frustrated behavior with open ended questions. Pt shows great potential for improved communication ability. Will continue efforts.     SLP Assessment  SLP Recommendation/Assessment: Patient needs continued Speech Lanaguage Pathology Services SLP Visit Diagnosis: Aphasia (R47.01);Aphonia (R49.1)    Follow Up Recommendations  24 hour supervision/assistance    Frequency and Duration min 2x/week  2 weeks      SLP Evaluation Cognition  Overall Cognitive Status: Impaired/Different from baseline Arousal/Alertness: Awake/alert Orientation Level: Oriented to person;Oriented to place;Oriented to situation(some difficulty expressing month, seemed to try to show 2) Attention: Focused;Sustained Focused Attention: Appears intact Sustained Attention: Impaired       Comprehension  Auditory Comprehension Overall Auditory Comprehension: Appears within functional limits for tasks assessed Reading Comprehension Reading Status: Not tested    Expression Verbal Expression Overall Verbal Expression: Impaired Initiation: No impairment Automatic Speech: Name;Social Response Level of Generative/Spontaneous Verbalization: Word Repetition: No impairment Naming: Not tested Written Expression Dominant Hand: Right Written Expression: Not tested   Oral / Motor  Oral Motor/Sensory Function Overall Oral Motor/Sensory Function: Within functional limits Motor Speech Overall Motor Speech: Appears within functional limits for tasks assessed Phonation: Aphonic Intelligibility: Intelligibility reduced Word: 75-100% accurate Phrase: Not tested   GO                    Andrew Ditty, MA CCC-SLP  Acute Rehabilitation Services Pager  [phone] Office [phone]  Andrew Bruce 03/12/2019, 12:25 PM

## 2019-03-12 NOTE — Progress Notes (Signed)
PROGRESS NOTE    Andrew Bruce  YFR:[id number] DOB: July 31, 1965 DOA: 02/06/2019 PCP: Patient, No Pcp Per    Brief Narrative:  Patient was admitted to the hospital with a working diagnosis of severe acute hypoxic respiratory failure due to SARS COVID-19 viral pneumonia.  Prolonged hospitalization, arterial/venous thromboembolic complications (aortic thrombus/CVA/PE), now status post tracheostomy and PEG tube  54 year old male who presented with worsening dyspnea for 2 days in the setting of 10 days of COVID-19 illness.  He was initially managed at home after his diagnosis of COVID-19, about a week prior to hospitalization.  48 hours prior hospitalization he developed severe and rapid progressive dyspnea.  In the emergency department he was severely hypoxic and placed on nonrebreather mask along with nasal cannula.   24 hours after hospitalization he was placed on invasive mechanical ventilation due to severe and progressive respiratory failure due to ARDS.  His hospitalization has been complicated by aortic thrombus, multiple CVAs and pneumothoraces. 01/25 patient required bilateral chest tubes and he was diagnosed with CVA.  02/16.  Patient underwent tracheostomy, the following day 02/17 his chest tubes were removed.  He was finally liberated to trach collar on February 25, requiring 10 L/min, 40% FiO2  02/26 transferred care to [facility].  Assessment & Plan:   Active Problems:   COVID-19   Acute respiratory failure with hypoxia (HCC)   Pneumothorax, right   Cerebral embolism with cerebral infarction   ARDS (adult respiratory distress syndrome) (HCC)   On mechanically assisted ventilation (HCC)   Goals of care, counseling/discussion   Palliative care encounter   Pressure injury of skin   1. Acute hypoxemic respiratory failure due to SARS COVID 19 viral pneumonia/ ARDS. Prolonged hospitalization, complicated by bilateral pneumothorax, now sp trach. His last chest radiograph from 02/18  personally reviewed, noted bibasilar interstitial marking, predominantly at the right base, with peripheral interstitial infiltrates.   He has been tolerating well trach collar, now on 10 L/ min with 40% Fi02. Continue to have copious trach secretions.   Will continue pulmonary clearing techniques with chest pt, frequent suctioning, PT, OT and speech therapy..   Out of bed to chair as tolerated. Patient continue to be high risk for worsening hypoxic respiratory failure. Continue with scopolamine.   2. 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 Patient with right upper extremity weakness, today is more awake and alert, following commands. Will continue anticoagulation with apixaban, aspiration precautions.   Positive dry necrosis, multiple toes, will continue conservative care. No signs of infection.  3. Pulmonary embolism/ acute right heart failure. Continue anticoagulation with apixaban with good toleration. Clinically euvolemic, will continue telemetry monitoring.   4. Acute metabolic encephalopathy. Likely multifactorial, will continue supportive medical care including nutrition per tube feedings, continue neuro checks per unit protocol and aspiration precautions. Will continue with as needed fentanyl, will dc versed.   5. Hypokalemia. K up to 4,0 with preserved renal function serum cr at 0,66, will continue close monitoring of renal function and electrolytes.   6. Uncontrolled T2DM (Hgb A7O 14,1) complicated with steroid induced hyperglycemia. Dyslipidemia Fasting insulin this am up to 254, patient now off steroids, will continue glucose cover and monitoring with insulin sliding scale, continue basal insulin 12 units bid. Tolerating well tube feedings. Continue with atorvastatin.   7. Skin pressure ulcer. Coccyx unstageable not present on admission, bilateral ears stage 1. Will continue with local wound care.   DVT prophylaxis: apixaban.   Code Status:  full  Family Communication: no family at the bedside  Disposition Plan/ discharge barriers: transfer to progressive care.    Nutrition Status: Nutrition Problem: Inadequate oral intake Etiology: acute illness Signs/Symptoms: NPO status Interventions: Tube feeding     Skin Documentation: Pressure Injury 02/22/19 Coccyx Medial Unstageable - Full thickness tissue loss in which the base of the injury is covered by slough (yellow, tan, gray, green or brown) and/or eschar (tan, brown or black) in the wound bed. quarter-sized/purple  (Active)  02/22/19 2330  Location: Coccyx  Location Orientation: Medial  Staging: Unstageable - Full thickness tissue loss in which the base of the injury is covered by slough (yellow, tan, gray, green or brown) and/or eschar (tan, brown or black) in the wound bed.  Wound Description (Comments): quarter-sized/purple   Present on Admission: No     Pressure Injury 03/01/19 Ear Left Stage 2 -  Partial thickness loss of dermis presenting as a shallow open injury with a red, pink wound bed without slough. (Active)  03/01/19 2217  Location: Ear  Location Orientation: Left  Staging: Stage 2 -  Partial thickness loss of dermis presenting as a shallow open injury with a red, pink wound bed without slough.  Wound Description (Comments):   Present on Admission: No     Pressure Injury 03/02/19 Ear Right Stage 1 -  Intact skin with non-blanchable redness of a localized area usually over a bony prominence. (Active)  03/02/19 2137  Location: Ear  Location Orientation: Right  Staging: Stage 1 -  Intact skin with non-blanchable redness of a localized area usually over a bony prominence.  Wound Description (Comments):   Present on Admission: No     Consultants:   Pulmonary   Procedures:   Trach and peg   Subjective: Patient very weak and deconditioned, follows commands and responds to simple questions with his gestures. No apparent pain or dyspnea. Continue to have  significant trach secretions. This am out of bed to chair.   Objective: Vitals:   03/12/19 0600 03/12/19 0615 03/12/19 0700 03/12/19 0811  BP:  113/81    Pulse: (!) 111 (!) 113    Resp: 16 (!) 25    Temp:   98.8 F (37.1 C)   TempSrc:   Axillary   SpO2: 91% 95%  99%  Weight:      Height:        Intake/Output Summary (Last 24 hours) at 03/12/2019 0904 Last data filed at 03/12/2019 0600 Gross per 24 hour  Intake 699.12 ml  Output 960 ml  Net -260.88 ml   Filed Weights   03/09/19 0443 03/10/19 0336 03/11/19 0500  Weight: 71.5 kg 71.7 kg 68.2 kg    Examination:   General: deconditioned  Neurology: Awake, follows commands and answers to simple questions. Strength 2/3 in right upper extremity, is 4/5 bilateral lower and left  Upper extremitty E ENT: no pallor, no icterus, oral mucosa moist, trach in place Cardiovascular: No JVD. S1-S2 present, rhythmic, no gallops, rubs, or murmurs. No lower extremity edema. Pulmonary: positive breath sounds bilaterally, decreased air movement, no wheezing, positive bilateral rhonchi and rales. Gastrointestinal. Abdomen flat, no organomegaly, non tender, no rebound or guarding, peg tube in place.  Skin. No rashes Musculoskeletal: no joint deformities     Data Reviewed: I have personally reviewed following labs and imaging studies  CBC: Recent Labs  Lab 03/05/19 1041 03/06/19 0518  WBC 15.6* 13.5*  HGB 8.2* 8.6*  HCT 26.8* 28.3*  MCV 87.9 88.2  PLT 668* 161*   Basic Metabolic Panel: Recent Labs  Lab 03/08/19 0730 03/09/19 0500 03/10/19 0327 03/11/19 0352 03/12/19 0618  NA 140 141 140 141 144  K 3.2* 3.5 3.2* 3.2* 4.0  CL 100 104 103 104 110  CO2 28 27 26 24 25   GLUCOSE 113* 98 134* 87 254*  BUN 15 17 19 18  26*  CREATININE 0.32* 0.47* 0.49* 0.63 0.66  CALCIUM 8.5* 8.7* 8.6* 8.7* 8.6*  MG 2.3  --   --   --   --    GFR: Estimated Creatinine Clearance: 97.5 mL/min (by C-G formula based on SCr of 0.66 mg/dL). Liver  Function Tests: No results for input(s): AST, ALT, ALKPHOS, BILITOT, PROT, ALBUMIN in the last 168 hours. No results for input(s): LIPASE, AMYLASE in the last 168 hours. No results for input(s): AMMONIA in the last 168 hours. Coagulation Profile: No results for input(s): INR, PROTIME in the last 168 hours. Cardiac Enzymes: No results for input(s): CKTOTAL, CKMB, CKMBINDEX, TROPONINI in the last 168 hours. BNP (last 3 results) No results for input(s): PROBNP in the last 8760 hours. HbA1C: No results for input(s): HGBA1C in the last 72 hours. CBG: Recent Labs  Lab 03/11/19 1539 03/11/19 1915 03/11/19 2322 03/12/19 0321 03/12/19 0722  GLUCAP 133* 90 189* 275* 222*   Lipid Profile: No results for input(s): CHOL, HDL, LDLCALC, TRIG, CHOLHDL, LDLDIRECT in the last 72 hours. Thyroid Function Tests: No results for input(s): TSH, T4TOTAL, FREET4, T3FREE, THYROIDAB in the last 72 hours. Anemia Panel: No results for input(s): VITAMINB12, FOLATE, FERRITIN, TIBC, IRON, RETICCTPCT in the last 72 hours.    Radiology Studies: I have reviewed all of the imaging during this hospital visit personally     Scheduled Meds: . apixaban  5 mg Per Tube BID  . vitamin C  500 mg Per Tube Daily  . atorvastatin  20 mg Per Tube q1800  . chlorhexidine gluconate (MEDLINE KIT)  15 mL Mouth Rinse BID  . Chlorhexidine Gluconate Cloth  6 each Topical Daily  . doxazosin  1 mg Per Tube Daily  . famotidine  20 mg Per Tube BID  . feeding supplement (PRO-STAT SUGAR FREE 64)  30 mL Per Tube BID  . folic acid  1 mg Per Tube Daily  . insulin aspart  0-20 Units Subcutaneous Q4H  . insulin aspart  2 Units Subcutaneous Q4H  . insulin detemir  12 Units Subcutaneous BID  . mouth rinse  15 mL Mouth Rinse 10 times per day  . multivitamin  15 mL Per Tube Daily  . nutrition supplement (JUVEN)  1 packet Per Tube BID BM  . polyethylene glycol  17 g Per Tube Daily  . scopolamine  1 patch Transdermal Q72H  .  senna-docusate  1 tablet Per Tube BID  . sodium chloride flush  10-40 mL Intracatheter Q12H  . thiamine  100 mg Per Tube Daily  . zinc sulfate  220 mg Per Tube Daily   Continuous Infusions: . sodium chloride 10 mL/hr at 03/12/19 0600  . feeding supplement (OSMOLITE 1.5 CAL) 55 mL/hr at 03/12/19 0400     LOS: 34 days         Gerome Apley, MD

## 2019-03-13 ENCOUNTER — Inpatient Hospital Stay (HOSPITAL_COMMUNITY): Payer: HRSA Program

## 2019-03-13 LAB — CBC WITH DIFFERENTIAL/PLATELET
Abs Immature Granulocytes: 0.14 10*3/uL — ABNORMAL HIGH (ref 0.00–0.07)
Basophils Absolute: 0.1 10*3/uL (ref 0.0–0.1)
Basophils Relative: 0 %
Eosinophils Absolute: 0.1 10*3/uL (ref 0.0–0.5)
Eosinophils Relative: 1 %
HCT: 33.9 % — ABNORMAL LOW (ref 39.0–52.0)
Hemoglobin: 10.1 g/dL — ABNORMAL LOW (ref 13.0–17.0)
Immature Granulocytes: 1 %
Lymphocytes Relative: 13 %
Lymphs Abs: 1.9 10*3/uL (ref 0.7–4.0)
MCH: 26.6 pg (ref 26.0–34.0)
MCHC: 29.8 g/dL — ABNORMAL LOW (ref 30.0–36.0)
MCV: 89.2 fL (ref 80.0–100.0)
Monocytes Absolute: 0.8 10*3/uL (ref 0.1–1.0)
Monocytes Relative: 5 %
Neutro Abs: 12.4 10*3/uL — ABNORMAL HIGH (ref 1.7–7.7)
Neutrophils Relative %: 80 %
Platelets: 616 10*3/uL — ABNORMAL HIGH (ref 150–400)
RBC: 3.8 MIL/uL — ABNORMAL LOW (ref 4.22–5.81)
RDW: 17.1 % — ABNORMAL HIGH (ref 11.5–15.5)
WBC: 15.4 10*3/uL — ABNORMAL HIGH (ref 4.0–10.5)
nRBC: 0 % (ref 0.0–0.2)

## 2019-03-13 LAB — GLUCOSE, CAPILLARY
Glucose-Capillary: 112 mg/dL — ABNORMAL HIGH (ref 70–99)
Glucose-Capillary: 156 mg/dL — ABNORMAL HIGH (ref 70–99)
Glucose-Capillary: 223 mg/dL — ABNORMAL HIGH (ref 70–99)
Glucose-Capillary: 312 mg/dL — ABNORMAL HIGH (ref 70–99)
Glucose-Capillary: 326 mg/dL — ABNORMAL HIGH (ref 70–99)
Glucose-Capillary: 86 mg/dL (ref 70–99)

## 2019-03-13 LAB — BASIC METABOLIC PANEL
Anion gap: 10 (ref 5–15)
BUN: 26 mg/dL — ABNORMAL HIGH (ref 6–20)
CO2: 26 mmol/L (ref 22–32)
Calcium: 8.5 mg/dL — ABNORMAL LOW (ref 8.9–10.3)
Chloride: 112 mmol/L — ABNORMAL HIGH (ref 98–111)
Creatinine, Ser: 0.51 mg/dL — ABNORMAL LOW (ref 0.61–1.24)
GFR calc Af Amer: >60 mL/min (ref >60)
GFR calc non Af Amer: >60 mL/min (ref >60)
Glucose, Bld: 140 mg/dL — ABNORMAL HIGH (ref 70–99)
Potassium: 3.9 mmol/L (ref 3.5–5.1)
Sodium: 148 mmol/L — ABNORMAL HIGH (ref 135–145)

## 2019-03-13 LAB — PROCALCITONIN: Procalcitonin: <0.1 ng/mL

## 2019-03-13 MED ORDER — INSULIN DETEMIR 100 UNIT/ML ~~LOC~~ SOLN
17.0000 [IU] | Freq: Two times a day (BID) | SUBCUTANEOUS | Status: DC
  Administered 2019-03-13 – 2019-03-14 (×2): 17 [IU] via SUBCUTANEOUS
  Filled 2019-03-13 (×3): qty 0.17

## 2019-03-13 MED ORDER — METOPROLOL TARTRATE 5 MG/5ML IV SOLN
5.0000 mg | INTRAVENOUS | Status: DC | PRN
  Administered 2019-03-13 – 2019-03-15 (×4): 5 mg via INTRAVENOUS
  Filled 2019-03-13 (×4): qty 5

## 2019-03-13 MED ORDER — IPRATROPIUM-ALBUTEROL 0.5-2.5 (3) MG/3ML IN SOLN
3.0000 mL | Freq: Four times a day (QID) | RESPIRATORY_TRACT | Status: DC
  Administered 2019-03-13 – 2019-03-20 (×30): 3 mL via RESPIRATORY_TRACT
  Filled 2019-03-13 (×31): qty 3

## 2019-03-13 NOTE — Progress Notes (Signed)
Patient continues to have HR 120's, not relieved by pain meds , patient denies pain but was given fentanyl IVP w/o much effect.patient alert some what hypervigilant. Patient suctioned of large amount of mucus from tracheostomy. Patient given standard  Catheter care, cleaned and made comfortable, repositioned and bony areas off loaded per routine. Patient evaluated by respiratory m trach care given, will continue to monitor dor changes.

## 2019-03-13 NOTE — Progress Notes (Signed)
PROGRESS NOTE    Andrew Bruce  IRW:[id number] DOB: [birth year] DOA: 02/06/2019 PCP: Patient, No Pcp Per    Brief Narrative:  Patient was admitted to the hospital with a working diagnosis of severe acute hypoxic respiratory failure due to SARS COVID-19 viral pneumonia.  Prolonged hospitalization, arterial/venous thromboembolic complications (aortic thrombus/CVA/PE), now status post tracheostomy and PEG tube  54 year old male who presented with worsening dyspnea for 2 days in the setting of 10 days of COVID-19 illness.  He was initially managed at home after his diagnosis of COVID-19, about a week prior to hospitalization.  48 hours prior hospitalization he developed severe and rapid progressive dyspnea.  In the emergency department he was severely hypoxic and placed on nonrebreather mask along with nasal cannula.   24 hours after hospitalization he was placed on invasive mechanical ventilation due to severe and progressive respiratory failure due to ARDS.  His hospitalization has been complicated by aortic thrombus, multiple CVAs and pneumothoraces. 01/25 patient required bilateral chest tubes and he was diagnosed with CVA.  02/16.  Patient underwent tracheostomy, the following day 02/17 his chest tubes were removed.  He was finally liberated to trach collar on February 25, requiring 10 L/min, 40% FiO2  02/26 transferred care to [facility].   Assessment & Plan:   Active Problems:   COVID-19   Acute respiratory failure with hypoxia (HCC)   Pneumothorax, right   Cerebral embolism with cerebral infarction   ARDS (adult respiratory distress syndrome) (HCC)   On mechanically assisted ventilation (HCC)   Goals of care, counseling/discussion   Palliative care encounter   Pressure injury of skin    1. Acute hypoxemic respiratory failure due to SARS COVID 19 viral pneumonia/ ARDS. Prolonged hospitalization, complicated by bilateral pneumothorax, now sp trach. His chest radiograph from  today personally reviewed, noted bilateral interstitial infiltrates, improved from prior chest radiograph.   Continue with 10 L/ min with 40% Fi02. Positive fever spike this am101.5. wbc at 15 from 13,5.  Follow on new blood cultures/ procalcitonin. Continue with aggressive pulmonary clearing techniques with chest pt, frequent suctioning, PT, OT and speech therapy. Out of bed to chair as tolerated. Will hold on antibiotic therapy for now. Discontinue foley catheter. He has a picc line with no tenderness or erythema at the entry site.   Patient continue to be high risk for worsening hypoxic respiratory failure. Continue with scopolamine.   2. 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 Persistent right upper extremity weakness. Awake and follows commands, will continue anticoagulation with apixaban, aspiration precautions, physical and occupational therapy.   Positive dry necrosis, multiple toes, supportive care.  3. Pulmonary embolism/ acute right heart failure. Tolerating well anticoagulation with apixaban.   4. Acute metabolic encephalopathy. Likely multifactorial. Patient is awake and alert, following commands and responding appropriately to questions, only mild delayed response. Continue nutritional supplementation and avoid sedatives.   5. Hypokalemia. Clinically resolved, renal functio and electrolyte stable.    6. Uncontrolled T2DM (Hgb P6P 95,0) complicated with steroid induced hyperglycemia. Dyslipidemia Fasting insulin this am up to 140. Glucose cover and monitoring with insulin sliding scale, plus basal insulin 12 units bid. aspart 2 untis q 4 H. Continue with tube feedings. On atorvastatin.   7. Skin pressure ulcer. Coccyx unstageable not present on admission, bilateral ears stage 1. Local wound care.   DVT prophylaxis: apixaban.   Code Status:        full Family Communication: no family at the bedside  Disposition Plan/ discharge  barriers:  transfer to progressive care   Nutrition Status: Nutrition Problem: Inadequate oral intake Etiology: acute illness Signs/Symptoms: NPO status Interventions: Tube feeding     Skin Documentation: Pressure Injury 02/22/19 Coccyx Medial Unstageable - Full thickness tissue loss in which the base of the injury is covered by slough (yellow, tan, gray, green or brown) and/or eschar (tan, brown or black) in the wound bed. quarter-sized/purple  (Active)  02/22/19 2330  Location: Coccyx  Location Orientation: Medial  Staging: Unstageable - Full thickness tissue loss in which the base of the injury is covered by slough (yellow, tan, gray, green or brown) and/or eschar (tan, brown or black) in the wound bed.  Wound Description (Comments): quarter-sized/purple   Present on Admission: No     Pressure Injury 03/01/19 Ear Left Stage 2 -  Partial thickness loss of dermis presenting as a shallow open injury with a red, pink wound bed without slough. (Active)  03/01/19 2217  Location: Ear  Location Orientation: Left  Staging: Stage 2 -  Partial thickness loss of dermis presenting as a shallow open injury with a red, pink wound bed without slough.  Wound Description (Comments):   Present on Admission: No     Pressure Injury 03/02/19 Ear Right Stage 1 -  Intact skin with non-blanchable redness of a localized area usually over a bony prominence. (Active)  03/02/19 2137  Location: Ear  Location Orientation: Right  Staging: Stage 1 -  Intact skin with non-blanchable redness of a localized area usually over a bony prominence.  Wound Description (Comments):   Present on Admission: No     Consultants:   Pulmonary    Procedures:   Peg and trach   Subjective: Patient this am has been tachycardic and increase respiratory rate, denies any pain or agitation, no nausea or vomiting and tolerating well tube feedings.    Objective: Vitals:   03/13/19 0820 03/13/19 0821 03/13/19 0830 03/13/19 0845   BP: 115/81  126/85 120/78  Pulse: (!) 128 (!) 129 (!) 112 (!) 110  Resp: (!) 33 (!) 31 (!) 32 (!) 30  Temp:    (!) 101.5 F (38.6 C)  TempSrc:    Oral  SpO2: 93% 93% 92% 93%  Weight:      Height:        Intake/Output Summary (Last 24 hours) at 03/13/2019 0906 Last data filed at 03/13/2019 0845 Gross per 24 hour  Intake 967.5 ml  Output 1110 ml  Net -142.5 ml   Filed Weights   03/10/19 0336 03/11/19 0500 03/13/19 0500  Weight: 71.7 kg 68.2 kg 70.1 kg    Examination:   General: deconditioned, not in pain or dyspnea.  Neurology: Awake and alert, non focal, trach in place.   E ENT: no pallor, no icterus, oral mucosa moist Cardiovascular: No JVD. S1-S2 present, rhythmic, no gallops, rubs, or murmurs. No lower extremity edema. Pulmonary: positive breath sounds bilaterally, adequate air movement, no wheezing, scattered rhonchi and rales bilaterally. Gastrointestinal. Abdomen with no organomegaly, non tender, no rebound or guarding. Peg tube in place.  Skin. No rashes Musculoskeletal: no joint deformities     Data Reviewed: I have personally reviewed following labs and imaging studies  CBC: Recent Labs  Lab 03/13/19 0534  WBC 15.4*  NEUTROABS 12.4*  HGB 10.1*  HCT 33.9*  MCV 89.2  PLT 557*   Basic Metabolic Panel: Recent Labs  Lab 03/08/19 0730 03/08/19 0730 03/09/19 0500 03/10/19 0327 03/11/19 0352 03/12/19 0618 03/13/19 0534  NA 140   < >  141 140 141 144 148*  K 3.2*   < > 3.5 3.2* 3.2* 4.0 3.9  CL 100   < > 104 103 104 110 112*  CO2 28   < > 27 26 24 25 26   GLUCOSE 113*   < > 98 134* 87 254* 140*  BUN 15   < > 17 19 18  26* 26*  CREATININE 0.32*   < > 0.47* 0.49* 0.63 0.66 0.51*  CALCIUM 8.5*   < > 8.7* 8.6* 8.7* 8.6* 8.5*  MG 2.3  --   --   --   --   --   --    < > = values in this interval not displayed.   GFR: Estimated Creatinine Clearance: 97.5 mL/min (A) (by C-G formula based on SCr of 0.51 mg/dL (L)). Liver Function Tests: No results for  input(s): AST, ALT, ALKPHOS, BILITOT, PROT, ALBUMIN in the last 168 hours. No results for input(s): LIPASE, AMYLASE in the last 168 hours. No results for input(s): AMMONIA in the last 168 hours. Coagulation Profile: No results for input(s): INR, PROTIME in the last 168 hours. Cardiac Enzymes: No results for input(s): CKTOTAL, CKMB, CKMBINDEX, TROPONINI in the last 168 hours. BNP (last 3 results) No results for input(s): PROBNP in the last 8760 hours. HbA1C: No results for input(s): HGBA1C in the last 72 hours. CBG: Recent Labs  Lab 03/12/19 1528 03/12/19 1957 03/13/19 0039 03/13/19 0440 03/13/19 0844  GLUCAP 91 234* 312* 112* 326*   Lipid Profile: No results for input(s): CHOL, HDL, LDLCALC, TRIG, CHOLHDL, LDLDIRECT in the last 72 hours. Thyroid Function Tests: No results for input(s): TSH, T4TOTAL, FREET4, T3FREE, THYROIDAB in the last 72 hours. Anemia Panel: No results for input(s): VITAMINB12, FOLATE, FERRITIN, TIBC, IRON, RETICCTPCT in the last 72 hours.    Radiology Studies: I have reviewed all of the imaging during this hospital visit personally     Scheduled Meds: . apixaban  5 mg Per Tube BID  . vitamin C  500 mg Per Tube Daily  . atorvastatin  20 mg Per Tube q1800  . chlorhexidine gluconate (MEDLINE KIT)  15 mL Mouth Rinse BID  . Chlorhexidine Gluconate Cloth  6 each Topical Daily  . doxazosin  1 mg Per Tube Daily  . famotidine  20 mg Per Tube BID  . feeding supplement (PRO-STAT SUGAR FREE 64)  30 mL Per Tube BID  . folic acid  1 mg Per Tube Daily  . insulin aspart  0-20 Units Subcutaneous Q4H  . insulin aspart  2 Units Subcutaneous Q4H  . insulin detemir  12 Units Subcutaneous BID  . mouth rinse  15 mL Mouth Rinse 10 times per day  . multivitamin  15 mL Per Tube Daily  . nutrition supplement (JUVEN)  1 packet Per Tube BID BM  . polyethylene glycol  17 g Per Tube Daily  . scopolamine  1 patch Transdermal Q72H  . senna-docusate  1 tablet Per Tube BID  .  sodium chloride flush  10-40 mL Intracatheter Q12H  . thiamine  100 mg Per Tube Daily  . zinc sulfate  220 mg Per Tube Daily   Continuous Infusions: . sodium chloride Stopped (03/12/19 1606)  . feeding supplement (OSMOLITE 1.5 CAL) 1,000 mL (03/12/19 0700)     LOS: 35 days        Marisa Hufstetler Gerome Apley, MD

## 2019-03-13 NOTE — Progress Notes (Signed)
Blood sugar 112 at 4AM -2U insulin held

## 2019-03-13 NOTE — Plan of Care (Signed)
Patient able to sit up assisted on bedside x 2 with PT/OT. Stand-pivot transfer with gait belt times 2 to chair (drop-arm recliner). Patient had difficulty maintaining upright position and holding head up. Passive ROM given. He became very fatigued. Will use lift to return to bed. Given Tylenol with am meds - faces pain scale 6/10. Pt now resting comfortably. Mobilizing his own secretions - external suctioning and trach care provided. Nutritional supplements and tube feedings continued.

## 2019-03-13 NOTE — Evaluation (Addendum)
Physical Therapy Evaluation Patient Details Name: Andrew Bruce MRN: [id number] DOB: 02/16/1965 Today's Date: 03/13/2019   History of Present Illness  [name] without a past medical history admitted 1/23 with 48 hours of worsening shortness of breath in the context of a 10-day Covid illness (dry cough, lost taste/smell).  Chest x-ray and vitals were consistent with a worsening COVID ARDS.  Admitted for observation.  Developed worsening shortness of breath and increasing O2 needs. Decompensated requiring intubation 1/24.  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  Has developed gangrene of toes as well.    Clinical Impression  Pt is alert and following some one step commands; responds yes/no fairly consistently. Pt presents with decreased functional mobility secondary to gross weakness/debility (right side weaker than left), poor balance, aphasia, and right visual field deficits. Pt requiring two person total assist for all mobility, but able to tolerate at least ~20 minutes edge of bed and transfer to recliner via squat pivot. SpO2 90-94% on 40% FiO2, HR 110-128 bpm, BP stable. Given age and PLOF, recommend CIR to address deficits and maximize functional mobility.   [employer] interpreter utilized for this session.    Follow Up Recommendations CIR;Supervision/Assistance - 24 hour    Equipment Recommendations  Wheelchair (measurements PT);Wheelchair cushion (measurements PT);Hospital bed; [facility] Lift   Recommendations for Other Services       Precautions / Restrictions Precautions Precautions: Fall;Other (comment) Precaution Comments: trach, PEG Restrictions Weight Bearing Restrictions: No      Mobility  Bed Mobility Overal bed mobility: Needs Assistance Bed Mobility: Supine to Sit     Supine to sit: Total assist;+2 for physical assistance     General bed mobility comments: TotalA + 2 for  transition to edge of bed  Transfers Overall transfer level: Needs assistance   Transfers: Squat Pivot Transfers     Squat pivot transfers: +2 physical assistance;Total assist     General transfer comment: TotalA + 2 for squat pivot transfer towards left with use of gait belt and pad  Ambulation/Gait                Stairs            Wheelchair Mobility    Modified Rankin (Stroke Patients Only) Modified Rankin (Stroke Patients Only) Pre-Morbid Rankin Score: No significant disability Modified Rankin: Severe disability     Balance Overall balance assessment: Needs assistance Sitting-balance support: Feet supported;No upper extremity supported Sitting balance-Leahy Scale: Zero Sitting balance - Comments: Max-totalA for static sitting balance                                     Pertinent Vitals/Pain Pain Assessment: Faces Faces Pain Scale: Hurts little more Pain Location: Pt squeezing eyes shut intermittently and grimacing when sitting EOB; when asked if he had a HA, pt nodding yes Pain Descriptors / Indicators: Grimacing Pain Intervention(s): RN gave pain meds during session;Limited activity within patient's tolerance;Monitored during session;Repositioned    Home Living Family/patient expects to be discharged to:: Private residence Living Arrangements: Spouse/significant other                    Prior Function           Comments: Pt unable to provide     Hand Dominance   Dominant Hand: Right    Extremity/Trunk Assessment  Upper Extremity Assessment Upper Extremity Assessment: Defer to OT evaluation    Lower Extremity Assessment Lower Extremity Assessment: RLE deficits/detail;LLE deficits/detail RLE Deficits / Details: Grossly 1/5 LLE Deficits / Details: Grossly 2/5       Communication   Communication: Tracheostomy;Prefers language other than English  Cognition Arousal/Alertness: Awake/alert Behavior During  Therapy: Flat affect Overall Cognitive Status: Difficult to assess Area of Impairment: Following commands                       Following Commands: Follows one step commands inconsistently;Follows one step commands with increased time       General Comments: Pt with one attempt to verbalize, stating, "water." Fairly consistent yes/no responses, following > 50% of commands with increased time.       General Comments      Exercises     Assessment/Plan    PT Assessment Patient needs continued PT services  PT Problem List Decreased strength;Decreased range of motion;Decreased activity tolerance;Decreased balance;Decreased mobility;Decreased safety awareness;Cardiopulmonary status limiting activity       PT Treatment Interventions Functional mobility training;Therapeutic activities;Therapeutic exercise;Balance training;Patient/family education;Wheelchair mobility training    PT Goals (Current goals can be found in the Care Plan section)  Acute Rehab PT Goals Patient Stated Goal: unable PT Goal Formulation: Patient unable to participate in goal setting Time For Goal Achievement: 03/27/19 Potential to Achieve Goals: Fair    Frequency Min 3X/week   Barriers to discharge        Co-evaluation PT/OT/SLP Co-Evaluation/Treatment: Yes Reason for Co-Treatment: Complexity of the patient's impairments (multi-system involvement);Necessary to address cognition/behavior during functional activity;For patient/therapist safety;To address functional/ADL transfers PT goals addressed during session: Mobility/safety with mobility;Balance         AM-PAC PT "6 Clicks" Mobility  Outcome Measure Help needed turning from your back to your side while in a flat bed without using bedrails?: Total Help needed moving from lying on your back to sitting on the side of a flat bed without using bedrails?: Total Help needed moving to and from a bed to a chair (including a wheelchair)?:  Total Help needed standing up from a chair using your arms (e.g., wheelchair or bedside chair)?: Total Help needed to walk in hospital room?: Total Help needed climbing 3-5 steps with a railing? : Total 6 Click Score: 6    End of Session Equipment Utilized During Treatment: Gait belt;Oxygen Activity Tolerance: Patient tolerated treatment well Patient left: in chair;with call bell/phone within reach Nurse Communication: Mobility status;Need for lift equipment PT Visit Diagnosis: Muscle weakness (generalized) (M62.81)    Time: 7096-2836 PT Time Calculation (min) (ACUTE ONLY): 39 min   Charges:   PT Evaluation $PT Eval Moderate Complexity: 1 Mod PT Treatments $Therapeutic Activity: 8-22 mins          Wyona Almas, PT, DPT Acute Rehabilitation Services Pager [phone] Office [phone]   Deno Etienne 03/13/2019, 9:11 AM

## 2019-03-13 NOTE — Evaluation (Signed)
Occupational Therapy Evaluation Patient Details Name: Andrew Bruce MRN: [id number] DOB: [birth year] Today's Date: 03/13/2019    History of Present Illness [name] without a past medical history admitted 1/23 with 48 hours of worsening shortness of breath in the context of a 10-day Covid illness (dry cough, lost taste/smell).  Chest x-ray and vitals were consistent with a worsening COVID ARDS.  Admitted for observation.  Developed worsening shortness of breath and increasing O2 needs. Decompensated requiring intubation 1/24.  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  Has developed gangrene of toes as well.     Clinical Impression   This 54 y/o male presents with the above. Pt currently presenting with significant weakness (R side weakness > L side), impaired cognition, visual deficits and impaired balance. Pt currently requiring totalA+2 for completion of bed mobility and squat pivot to drop arm recliner. Pt tolerate sitting EOB >10 min during session, requiring max-totalA for sitting balance throughout and with VSS. Pt able to follow approx 50% one step simple commands given increased time and cues, completing simple face washing task seated EOB with support provided at L elbow and max cues to initiate/carry over task. Per RN report pt's spouse has been present daily and very supportive. He will benefit from continued acute OT services and recommend post acute rehab services to maximize his safety and independence with ADL and mobility prior to return home (pending progress feel pt may benefit from CIR level services). Will follow.     Follow Up Recommendations  CIR;Supervision/Assistance - 24 hour(pending progress)    Equipment Recommendations  Other (comment)(TBD)           Precautions / Restrictions Precautions Precautions: Fall;Other (comment) Precaution Comments: trach, PEG Restrictions Weight  Bearing Restrictions: No      Mobility Bed Mobility Overal bed mobility: Needs Assistance Bed Mobility: Supine to Sit     Supine to sit: Total assist;+2 for physical assistance     General bed mobility comments: TotalA + 2 for transition to edge of bed  Transfers Overall transfer level: Needs assistance   Transfers: Squat Pivot Transfers     Squat pivot transfers: +2 physical assistance;Total assist     General transfer comment: TotalA + 2 for squat pivot transfer towards left with use of gait belt and pad    Balance Overall balance assessment: Needs assistance Sitting-balance support: Feet supported;No upper extremity supported Sitting balance-Leahy Scale: Zero Sitting balance - Comments: Max-totalA for static sitting balance                                   ADL either performed or assessed with clinical judgement   ADL Overall ADL's : Needs assistance/impaired     Grooming: Maximal assistance;Total assistance;Wash/dry face;Sitting                                 General ADL Comments: pt currently requires overall totalA, use of hand over hand for simple face washing task with support provided at pt's L elbow, pt minimally able to carryover and perform task but only for brief period of time (<10sec)      Vision   Vision Assessment?: Vision impaired- to be further tested in functional context Additional Comments: pt not tracking towards R side despite cues/instruction, suspect likely R visual deficits  vs R neglect/inattention      Perception     Praxis      Pertinent Vitals/Pain Pain Assessment: Faces Faces Pain Scale: Hurts little more Pain Location: Pt squeezing eyes shut intermittently and grimacing when sitting EOB; when asked if he had a HA, pt nodding yes Pain Descriptors / Indicators: Grimacing Pain Intervention(s): RN gave pain meds during session;Limited activity within patient's tolerance;Monitored during  session;Repositioned     Hand Dominance Right   Extremity/Trunk Assessment Upper Extremity Assessment Upper Extremity Assessment: RUE deficits/detail;LUE deficits/detail;Difficult to assess due to impaired cognition RUE Deficits / Details: grossly 1/5, pt able to activate UE movements, weak grip strength RUE Sensation: (to be further assessed) RUE Coordination: decreased fine motor;decreased gross motor LUE Deficits / Details: grossly 2/5 LUE Coordination: decreased fine motor;decreased gross motor   Lower Extremity Assessment Lower Extremity Assessment: Defer to PT evaluation       Communication Communication Communication: Tracheostomy;Prefers language other than English   Cognition Arousal/Alertness: Awake/alert Behavior During Therapy: Flat affect Overall Cognitive Status: Difficult to assess Area of Impairment: Following commands                       Following Commands: Follows one step commands inconsistently;Follows one step commands with increased time       General Comments: Pt with one attempt to verbalize, stating, "water." Fairly consistent yes/no responses, following > 50% of commands with increased time.    General Comments  [employer] interpreter used, Caryn Bee [phone]; O2 sats 90-94% on trach collar (40%), HR 110-128, BP stable     Exercises     Shoulder Instructions      Home Living Family/patient expects to be discharged to:: Private residence Living Arrangements: Spouse/significant other                               Additional Comments: pt unable to provide      Prior Functioning/Environment          Comments: Pt unable to provide        OT Problem List: Decreased range of motion;Decreased strength;Decreased activity tolerance;Impaired balance (sitting and/or standing);Decreased coordination;Decreased cognition;Impaired vision/perception;Decreased safety awareness;Decreased knowledge of use of DME or AE;Decreased  knowledge of precautions;Cardiopulmonary status limiting activity;Impaired sensation;Impaired tone;Impaired UE functional use      OT Treatment/Interventions: Self-care/ADL training;Therapeutic exercise;Neuromuscular education;Energy conservation;DME and/or AE instruction;Therapeutic activities;Cognitive remediation/compensation;Visual/perceptual remediation/compensation;Patient/family education;Balance training    OT Goals(Current goals can be found in the care plan section) Acute Rehab OT Goals Patient Stated Goal: unable OT Goal Formulation: With patient Time For Goal Achievement: 03/27/19 Potential to Achieve Goals: Good  OT Frequency: Min 2X/week   Barriers to D/C:            Co-evaluation PT/OT/SLP Co-Evaluation/Treatment: Yes Reason for Co-Treatment: Complexity of the patient's impairments (multi-system involvement);Necessary to address cognition/behavior during functional activity;For patient/therapist safety;To address functional/ADL transfers   OT goals addressed during session: ADL's and self-care      AM-PAC OT "6 Clicks" Daily Activity     Outcome Measure Help from another person eating meals?: Total Help from another person taking care of personal grooming?: Total Help from another person toileting, which includes using toliet, bedpan, or urinal?: Total Help from another person bathing (including washing, rinsing, drying)?: Total Help from another person to put on and taking off regular upper body clothing?: Total Help from another person to put on and taking off regular lower  body clothing?: Total 6 Click Score: 6   End of Session Equipment Utilized During Treatment: Gait belt;Oxygen Nurse Communication: Need for lift equipment(use of maximove back to bed)  Activity Tolerance: Patient limited by fatigue;Patient tolerated treatment well Patient left: in bed;with call bell/phone within reach;with nursing/sitter in room  OT Visit Diagnosis: Hemiplegia and  hemiparesis;Other symptoms and signs involving cognitive function;Other abnormalities of gait and mobility (R26.89);Other symptoms and signs involving the nervous system (R29.898) Hemiplegia - Right/Left: Right Hemiplegia - 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

## 2019-03-13 NOTE — Progress Notes (Signed)
Rehab Admissions Coordinator Note:  Per PT recommendation, this patient was screened by Cheri Rous for appropriateness for an Inpatient Acute Rehab Consult.  Noted pt is currently total A +2 for bed mobility and transfers at this time. Will follow for greater participation with therapy prior to placing consult order for further evaluation.   Cheri Rous 03/13/2019, 9:46 AM  I can be reached at [phone].

## 2019-03-13 NOTE — Discharge Instructions (Addendum)
Information on my medicine - ELIQUIS (apixaban)  This medication education was reviewed with me or my healthcare representative as part of my discharge preparation.   Why was Eliquis prescribed for you? Eliquis was prescribed to treat blood clots that may have been found in the veins of your legs (deep vein thrombosis) or in your lungs (pulmonary embolism) and to reduce the risk of them occurring again.  What do You need to know about Eliquis ? The dose is ONE 5 mg tablet taken TWICE daily.  Eliquis may be taken with or without food.   Try to take the dose about the same time in the morning and in the evening. If you have difficulty swallowing the tablet whole please discuss with your pharmacist how to take the medication safely.  Take Eliquis exactly as prescribed and DO NOT stop taking Eliquis without talking to the doctor who prescribed the medication.  Stopping may increase your risk of developing a new blood clot.  Refill your prescription before you run out.  After discharge, you should have regular check-up appointments with your healthcare provider that is prescribing your Eliquis.    What do you do if you miss a dose? If a dose of ELIQUIS is not taken at the scheduled time, take it as soon as possible on the same day and twice-daily administration should be resumed. The dose should not be doubled to make up for a missed dose.  Important Safety Information A possible side effect of Eliquis is bleeding. You should call your healthcare provider right away if you experience any of the following: ? Bleeding from an injury or your nose that does not stop. ? Unusual colored urine (red or dark brown) or unusual colored stools (red or black). ? Unusual bruising for unknown reasons. ? A serious fall or if you hit your head (even if there is no bleeding).  Some medicines may interact with Eliquis and might increase your risk of bleeding or clotting while on Eliquis. To help avoid  this, consult your healthcare provider or pharmacist prior to using any new prescription or non-prescription medications, including herbals, vitamins, non-steroidal anti-inflammatory drugs (NSAIDs) and supplements.  This website has more information on Eliquis (apixaban): http://www.eliquis.com/eliquis/home  ==================================================================  Information del medicamento  ELIQUIS (apixaban) POR QU ESTOY TOMANDO APIXABN (ELIQUIS)? Su mdico le ha recomendado que tome el medicamento apixabn [place]). Este medicamento es un anticoagulante; anti significa contra y coagulant significa coagulante, as que es un medicamento que previene los cogulos sanguneos. A veces, a este medicamento se le llama diluyente sanguneo. Un anticoagulante ayuda a prevenir la formacin de Technical sales engineer.  Mientras toma este medicamento, es necesario mantenerle bajo estrecha supervisin para Scientist, research (life sciences) y moretones. Su proveedor de IT consultant con usted para Designer, multimedia seguro y sano mientras toma apixabn [place]). Por favor, notifquele a su proveedor de atencin mdica si tiene alguna pregunta.   Entre ms sepan sobre este medicamento mejor equipo harn usted y su proveedor de atencin mdica. Le rogamos que se tome su tiempo para leer toda la informacin en este folleto.  CM[place] SE DEBE TOMAR EL APIXABN (ELIQUIS )?  Tome su medicamento exactamente como se le recete. El apixabn [place]) se toma dos veces al da a la misma hora todos [place].  El medicamento puede tomarse con o sin comida.  Los comprimidos pueden triturarse si tiene dificultades para tragrselos enteros.  Si se le olvida tomar una dosis, tmesela en cuanto lo recuerde. No tome ms  de [place]delia Mems dosis al mismo tiempo para compensar la dosis [place].  PUEDO TOMAR APIXABN (ELIQUIS ) CON OTROS MEDICAMENTOS? Es Manufacturing systems engineer con su mdico o Programmer, applications de todos  los dems medicamentos que est tomando, incluidos los medicamentos de venta [place], los antibiticos, las vitaminas o los suplementos de hierbas.   Podra tener mayor riesgo de hemorragia si toma otros medicamentos como:  aspirina o productos que contengan aspirina.  medicamentos antiinflamatorios no esteroides (NSAIDS): ibuprofeno (ADVIL) o naproxn (ALEVE).  otro medicamento para ayudar a Estate agent sanguneos como la warfarina (COUMADIN ) o el clopidogrel (PLAVIX).  CULES SON LOS EFECTOS SECUNDARIOS DEL APIXABN (ELIQUIS)?  pequeos moretones.  ligero sangrado de las encas al [employer].  sangrado ocasional de la nariz.  sangrado despus de una cortadura menor que para en unos minutos.  [place]? Si est sintiendo algo anormal que usted piensa que podra estar causado por el apixabn [place] ), por favor comunquese con su proveedor de atencin mdica.  Algunas cosas a las que debe estar alerta incluyen:  hemorragia mayor.  orina de color rojo, oscuro o caf.  heces de color rojo o alquitranado.  hemorragia que no para despus de 10 minutos.  vmito que es de color caf o rojo brillante.  Otras inquietudes podran incluir:  dolor inesperado, hinchazn o dolor en las articulaciones.  dolores de [place] o debilidad y [place].  una fuerte cada o un golpe en la cabeza.  si est embarazada o planea quedar embarazada.  [place] DE INMEDIATO CON SU [place] (ELIQUIS)? No deje de tomar apixabn (ELIQUIS) salvo que su proveedor de atencin [place] diga que lo haga. El apixabn [place]) posiblemente necesite dejarse de tomar antes de un procedimiento o [place]. [place] a su proveedor instrucciones especficas sobre cundo dejar de tomar el medicamento y  cuando volver a tomrselo.   Si desea o necesita dejar de tomar este medicamento por cualquier razn, comunquese de inmediato con su proveedor de atencin mdica antes de dejar de hacerlo.  [place] AUMENTAR SU RIESGO DE TENER UN COGULO [place] ANLISIS DE SANGRE REGULAR? Es posible que su proveedor de atencin mdica realice anlisis de sangre antes de iniciar el medicamento para que le ayuden a Pension scheme manager la dosis Advice worker. Necesitar realizarse estos anlisis de [place] por lo menos una vez al ao.   El apixabn [place] ) no requiere el control mensual de sangre como algunos otros anticoagulantes.  [place]? No hay restricciones dietticas que considerar mientras toma apixabn (ELIQUIS) ===========================================================================  Pulmonary Embolism    A pulmonary embolism (PE) is a sudden blockage or decrease of blood flow in one or both lungs. Most blockages come from a blood clot that forms in the vein of a lower leg, thigh, or arm (deep vein thrombosis, DVT) and travels to the lungs. A clot is blood that has thickened into a gel or solid. PE is a dangerous and life-threatening condition that needs to be treated right away.  What are the causes? This condition is usually caused by a blood clot that forms in a vein and moves to the lungs. In rare cases, it may be caused by air, fat, part of a tumor, or other tissue that moves through the veins and into the lungs.  What  increases the risk? The following factors may make you more likely to develop this condition: 1. Experiencing a traumatic injury, such as breaking a hip or leg. 2. Having: ? A spinal cord injury. ? Orthopedic surgery, especially hip or knee replacement. ? Any major surgery. ? A stroke. ? DVT. ? Blood clots or blood clotting disease. ? Long-term (chronic) lung or heart disease. ? Cancer treated with  chemotherapy. ? A central venous catheter. 3. Taking medicines that contain estrogen. These include birth control pills and hormone replacement therapy. 4. Being: ? Pregnant. ? In the period of time after your baby is delivered (postpartum). ? Older than age 32. ? Overweight. ? A smoker, especially if you have other risks.  What are the signs or symptoms? Symptoms of this condition usually start suddenly and include:  Shortness of breath during activity or at rest.  Coughing, coughing up blood, or coughing up blood-tinged mucus.  Chest pain that is often worse with deep breaths.  Rapid or irregular heartbeat.  Feeling light-headed or dizzy.  Fainting.  Feeling anxious.  Fever.  Sweating.  Pain and swelling in a leg. This is a symptom of DVT, which can lead to PE. How is this diagnosed? This condition may be diagnosed based on:  Your medical history.  A physical exam.  Blood tests.  CT pulmonary angiogram. This test checks blood flow in and around your lungs.  Ventilation-perfusion scan, also called a lung VQ scan. This test measures air flow and blood flow to the lungs.  An ultrasound of the legs.  How is this treated? Treatment for this condition depends on many factors, such as the cause of your PE, your risk for bleeding or developing more clots, and other medical conditions you have. Treatment aims to remove, dissolve, or stop blood clots from forming or growing larger. Treatment may include: 1. Medicines, such as: ? Blood thinning medicines (anticoagulants) to stop clots from forming. ? Medicines that dissolve clots (thrombolytics). 2. Procedures, such as: ? Using a flexible tube to remove a blood clot (embolectomy) or to deliver medicine to destroy it (catheter-directed thrombolysis). ? Inserting a filter into a large vein that carries blood to the heart (inferior vena cava). This filter (vena cava filter) catches blood clots before they reach the  lungs. ? Surgery to remove the clot (surgical embolectomy). This is rare. You may need a combination of immediate, long-term (up to 3 months after diagnosis), and extended (more than 3 months after diagnosis) treatments. Your treatment may continue for several months (maintenance therapy). You and your health care provider will work together to choose the treatment program that is best for you.  Follow these instructions at home: Medicines 1. Take over-the-counter and prescription medicines only as told by your health care provider. 2. If you are taking an anticoagulant medicine: ? Take the medicine every day at the same time each day. ? Understand what foods and drugs interact with your medicine. ? Understand the side effects of this medicine, including excessive bruising or bleeding. Ask your health care provider or pharmacist about other side effects.  General instructions  Wear a medical alert bracelet or carry a medical alert card that says you have had a PE and lists what medicines you take.  Ask your health care provider when you may return to your normal activities. Avoid sitting or lying for a long time without moving.  Maintain a healthy weight. Ask your health care provider what weight is healthy for you.  Do  not use any products that contain nicotine or tobacco, such as cigarettes, e-cigarettes, and chewing tobacco. If you need help quitting, ask your health care provider.  Talk with your health care provider about any travel plans. It is important to make sure that you are still able to take your medicine while on trips.  Keep all follow-up visits as told by your health care provider. This is important.  Contact a health care provider if:  You missed a dose of your blood thinner medicine.  Get help right away if: 1. You have: ? New or increased pain, swelling, warmth, or redness in an arm or leg. ? Numbness or tingling in an arm or leg. ? Shortness of breath during  activity or at rest. ? A fever. ? Chest pain. ? A rapid or irregular heartbeat. ? A severe headache. ? Vision changes. ? A serious fall or accident, or you hit your head. ? Stomach (abdominal) pain. ? Blood in your vomit, stool, or urine. ? A cut that will not stop bleeding. 2. You cough up blood. 3. You feel light-headed or dizzy. 4. You cannot move your arms or legs. 5. You are confused or have memory loss.  These symptoms may represent a serious problem that is an emergency. Do not wait to see if the symptoms will go away. Get medical help right away. Call your local emergency services (911 in the [place] Do not drive yourself to the hospital. Summary  A pulmonary embolism (PE) is a sudden blockage or decrease of blood flow in one or both lungs. PE is a dangerous and life-threatening condition that needs to be treated right away.  Treatments for this condition usually include medicines to thin your blood (anticoagulants) or medicines to break apart blood clots (thrombolytics).  If you are given blood thinners, it is important to take the medicine every day at the same time each day.  Understand what foods and drugs interact with any medicines that you are taking.  If you have signs of PE or DVT, call your local emergency services (911 in the [place] This information is not intended to replace advice given to you by your health care provider. Make sure you discuss any questions you have with your health care provider. Document Revised: 10/08/2017 Document Reviewed: 10/08/2017 Elsevier Patient Education  2020 [employer].   =====================================================

## 2019-03-13 NOTE — Progress Notes (Signed)
NAME:  Andrew Bruce, MRN:  [id number], DOB:  04-20-1965, LOS: 50 ADMISSION DATE:  02/06/2019, CONSULTATION DATE:  02/06/19 REFERRING MD:  ER, CHIEF COMPLAINT:  SOB   Brief History   [name] without a past medical history admitted 1/23 with 48 hours of worsening shortness of breath in the context of a 10-day Covid illness (dry cough, lost taste/smell).  Chest x-ray and vitals are consistent with a worsening COVID ARDS.  Admitted for evaluation, developed worsening shortness of breath and increasing O2 needs. Decompensated requiring intubation 1/24.  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   Past Medical History  None known prior to admit   Significant Hospital Events   1/23 Admit  1/24 Decompensated, tx to ICU, intubated 1/25 b/l chest tubes placed 1/25 CVC 2/03 flomax started 2/06 rt chest tube connection changed 2/09 Still no BM. Afebrile.  Left chest tube connection changed 2/10 Palliative care meeting > FULL CODE.     2/16 Trach 2/17 Chest tubes removed 2/18 witnessed aspiration 2/19 Cortrak dislodged / removed 2/21 Woke up /  Mental status improved 2/25 10L/40% ATC, up to chair   Consults:  Neurology  Palliative care  Procedures:  ETT 1/24 >> 2/16 Bilateral chest tube placement 1/25 >> 2/17 R Femoral TLC 1/25 >> 2/4 RUE PICC 2/4 >> Trach 2/16 >>   Significant Diagnostic Tests:  CT Head 2/2 >> no acute intracranial hemorrhage, large area of infarct in the left occipital lobe, likely subacute CTA Chest 2/3 >> LLL segmental PE, no CT evidence of right heart strain, multifocal PNA, background emphysema and probable degree of interstitial fibrosis ECHO 2/3 >> LVEF 55-60%, no WMA, hypokinetic right ventricular free wall, McConnells sign present. RV findings consistent with acute cor pulmonale, small pericardial effusion, mildly elevated pulmonary artery systolic pressure  Micro Data:  COVID 1/12 >>  positive  PCT 1/23 >> Neg Blood cx 2/5 >> negative Sputum 2/6 >> abundant diphtheroids Corynebacterium species  Sputum 2/18 >> normal flora  Antimicrobials:  Remdesivir 1/23>>1/28 Dexamethasone 1/23 >> 2/2  Vanc 2/5 >> 2/12  Zosyn 2/5 >> 2/9 Cefepime 2/9 >> 2/11 Unasyn 2/18 >> 2/23    Interim history/subjective:  No events, remains on TC. Denies pain. Remains very weak. Febrile, tachycardic, white count up.  Objective   Blood pressure 120/78, pulse (!) 114, temperature (!) 101.5 F (38.6 C), temperature source Oral, resp. rate (!) 24, height 5\' 6"  (1.676 m), weight 70.1 kg, SpO2 97 %.    FiO2 (%):  [40 %] 40 %   Intake/Output Summary (Last 24 hours) at 03/13/2019 1052 Last data filed at 03/13/2019 0845 Gross per 24 hour  Intake 892.5 ml  Output 1060 ml  Net -167.5 ml   Filed Weights   03/10/19 0336 03/11/19 0500 03/13/19 0500  Weight: 71.7 kg 68.2 kg 70.1 kg   GEN: middle age man in NAD HEENT: trach in place, minimal secretions CV: tachycardic, ext warm PULM: scattered rhonci, mild tachypnea GI: PEG site CDI, tube feeds ongoing EXT: trace anasarca NEURO: moves all 4 ext to command PSYCH: RASS 1 SKIN: diaphoretic   Resolved Hospital Problem list   Oliguria, hypernatremia Bilateral pneumothoraces Ventilator associated pneumonia  Assessment & Plan:   Acute hypoxemic respiratory failure due to COVID-19 pneumonia, ARDS, PE, left lower lobe pneumonia Tracheostomy Dependence  Profound deconditioning - Cuffless 6-0 in place since 2/25 - Continue TC, pulmonary toileting as ordered: CPT, duonebs, mucinex  Fever, WBC elevated- check blood/sputum cx, Pct,  CXR, low threshold to start HAI coverage  High sugars- defer to primary  Will follow with you  Myrla Halsted MD PCCM

## 2019-03-13 NOTE — Progress Notes (Signed)
Notified provider of tachycardia. Pt currently scoring red MEWS. Night shift RN reported tachycardia went as high as 130s during the night and did not respond to pain medication. She reported suctioning helped for about 5 minutes. Patient does not have any PRN to control rate.   03/13/19 0807  MEWS Score  Pulse Rate (!) 117  ECG Heart Rate (!) 118  Resp (!) 26  SpO2 99 %  MEWS Score  MEWS Temp 0  MEWS Systolic 0  MEWS Pulse 2  MEWS RR 2  MEWS LOC 0  MEWS Score 4  MEWS Score Color Red  Provider Notification  Provider Name/Title M. Arrien, MD  Date Provider Notified 03/13/19  Time Provider Notified [phone]  Notification Type Page (epic secure chat)  Notification Reason Other (Comment) (tachycardia, red MEWS)

## 2019-03-14 LAB — CBC WITH DIFFERENTIAL/PLATELET
Abs Immature Granulocytes: 0.14 10*3/uL — ABNORMAL HIGH (ref 0.00–0.07)
Basophils Absolute: 0 10*3/uL (ref 0.0–0.1)
Basophils Relative: 0 %
Eosinophils Absolute: 0.2 10*3/uL (ref 0.0–0.5)
Eosinophils Relative: 1 %
HCT: 32.7 % — ABNORMAL LOW (ref 39.0–52.0)
Hemoglobin: 9.5 g/dL — ABNORMAL LOW (ref 13.0–17.0)
Immature Granulocytes: 1 %
Lymphocytes Relative: 11 %
Lymphs Abs: 1.7 10*3/uL (ref 0.7–4.0)
MCH: 26.5 pg (ref 26.0–34.0)
MCHC: 29.1 g/dL — ABNORMAL LOW (ref 30.0–36.0)
MCV: 91.3 fL (ref 80.0–100.0)
Monocytes Absolute: 0.6 10*3/uL (ref 0.1–1.0)
Monocytes Relative: 4 %
Neutro Abs: 12.2 10*3/uL — ABNORMAL HIGH (ref 1.7–7.7)
Neutrophils Relative %: 83 %
Platelets: 541 10*3/uL — ABNORMAL HIGH (ref 150–400)
RBC: 3.58 MIL/uL — ABNORMAL LOW (ref 4.22–5.81)
RDW: 17.1 % — ABNORMAL HIGH (ref 11.5–15.5)
WBC: 14.8 10*3/uL — ABNORMAL HIGH (ref 4.0–10.5)
nRBC: 0 % (ref 0.0–0.2)

## 2019-03-14 LAB — GLUCOSE, CAPILLARY
Glucose-Capillary: 179 mg/dL — ABNORMAL HIGH (ref 70–99)
Glucose-Capillary: 197 mg/dL — ABNORMAL HIGH (ref 70–99)
Glucose-Capillary: 206 mg/dL — ABNORMAL HIGH (ref 70–99)
Glucose-Capillary: 251 mg/dL — ABNORMAL HIGH (ref 70–99)
Glucose-Capillary: 251 mg/dL — ABNORMAL HIGH (ref 70–99)
Glucose-Capillary: 263 mg/dL — ABNORMAL HIGH (ref 70–99)

## 2019-03-14 LAB — BASIC METABOLIC PANEL
Anion gap: 8 (ref 5–15)
BUN: 28 mg/dL — ABNORMAL HIGH (ref 6–20)
CO2: 27 mmol/L (ref 22–32)
Calcium: 8.5 mg/dL — ABNORMAL LOW (ref 8.9–10.3)
Chloride: 110 mmol/L (ref 98–111)
Creatinine, Ser: 0.44 mg/dL — ABNORMAL LOW (ref 0.61–1.24)
GFR calc Af Amer: >60 mL/min (ref >60)
GFR calc non Af Amer: >60 mL/min (ref >60)
Glucose, Bld: 253 mg/dL — ABNORMAL HIGH (ref 70–99)
Potassium: 4 mmol/L (ref 3.5–5.1)
Sodium: 145 mmol/L (ref 135–145)

## 2019-03-14 LAB — PROCALCITONIN: Procalcitonin: <0.1 ng/mL

## 2019-03-14 MED ORDER — INSULIN DETEMIR 100 UNIT/ML ~~LOC~~ SOLN
22.0000 [IU] | Freq: Two times a day (BID) | SUBCUTANEOUS | Status: DC
  Administered 2019-03-14 – 2019-03-16 (×4): 22 [IU] via SUBCUTANEOUS
  Filled 2019-03-14 (×5): qty 0.22

## 2019-03-14 MED ORDER — GLYCOPYRROLATE 1 MG PO TABS
1.0000 mg | ORAL_TABLET | Freq: Three times a day (TID) | ORAL | Status: DC
  Administered 2019-03-14 – 2019-03-17 (×9): 1 mg
  Filled 2019-03-14 (×9): qty 1

## 2019-03-14 NOTE — Progress Notes (Signed)
PROGRESS NOTE    Andrew Bruce  JWJ:[id number] DOB: March 15, 1965 DOA: 02/06/2019 PCP: Patient, No Pcp Per    Brief Narrative:  Patient was admitted to the hospital with a working diagnosis of severe acute hypoxic respiratory failure due to SARS COVID-19 viral pneumonia.Prolonged hospitalization, arterial/venous thromboembolic complications(aortic thrombus/CVA/PE),now status post tracheostomy and PEG tube  54 year old male who presented with worsening dyspnea for 2 days in the setting of 10 days of COVID-19 illness. He was initially managed at home after his diagnosis of COVID-19, about a week prior to hospitalization. 48 hours prior hospitalization he developed severe and rapid progressive dyspnea. In the emergency department he was severely hypoxic and placed on nonrebreather mask along with nasal cannula.   24 hours after hospitalization he was placed on invasive mechanical ventilation due to severe and progressive respiratory failure due to ARDS.  His hospitalization has been complicated by aortic thrombus, multiple CVAs and pneumothoraces. 01/25patient required bilateral chest tubes and he was diagnosed with CVA.  02/16.Patient underwent tracheostomy, thefollowing day02/17his chest tubes were removed.  He was finally liberated to trach collar on February 25,requiring10 L/min, 40% FiO2.  Cuff less 6-0 trach since 2/25.    02/26[facility]red [facility] to [facility].  Patient has remained hemodynamically stable, but continue to have significant trach secretions.    Assessment & Plan:   Active Problems:   COVID-19   Acute respiratory failure with hypoxia (HCC)   Pneumothorax, right   Cerebral embolism with cerebral infarction   ARDS (adult respiratory distress syndrome) (HCC)   On mechanically assisted ventilation (HCC)   Goals of care, counseling/discussion   Palliative care encounter   Pressure injury of skin   1. Acute hypoxemic respiratory failure due to SARS  COVID 19 viral pneumonia/ ARDS. Prolonged hospitalization, complicated by bilateral pneumothorax, now sp trach.   On 10 L/ min with 40% Fi02. No further fever, continue to have copious secretions per trach, wbc trending down to 14,8. Procalcitonine negative and cultures no growth.   On aggressive pulmonary clearing techniques with chest pt, frequent suctioning, PT, OT and speech therapy. Out of bed to chair as tolerated. Continue with scopolamine and will add robinul for secretions.   At high risk for worsening hypoxic respiratory failure.   2. 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 Persistent right upper extremity weakness. Awake and follows commands, Tolerating well anticoagulation with apixaban, continue with aspiration precautions, physical and occupational therapy.   Positive dry necrosis, multiple toes, supportive care.  3. Pulmonary embolism/ acute right heart failure. Continue anticoagulation with apixaban.   4. Acute metabolic encephalopathy. Likely multifactorial. Patient is awake and alert. Patient is awake and alert, orientated and not agitated, continue to be very weak and deconditioned, will continue with nutritional supplementation per tube feedings for now.    5. Hypokalemia. Corrected electrolytes, renal function with serum cr at 0,44/     6. Uncontrolled T2DM (Hgb A2Z 30,8) complicated with steroid induced hyperglycemia. Dyslipidemia Glucose this am 253, with capillary 251 and 179, Continue witg basal insulin 17 units bid, plus  aspart 2 untis q 4 H. On atorvastatin.   7. Skin pressure ulcer. Coccyx unstageable not present on admission, bilateral ears stage 1. Continue with local wound care.  DVT prophylaxis:apixaban. Code Status:full Family Communication:no family at the bedside Disposition Plan/ discharge barriers:patient will need a trach facility.    Nutrition Status: Nutrition Problem: Inadequate oral  intake Etiology: acute illness Signs/Symptoms: NPO status Interventions: Tube feeding     Skin Documentation:  Pressure Injury 02/22/19 Coccyx Medial Unstageable - Full thickness tissue loss in which the base of the injury is covered by slough (yellow, tan, gray, green or brown) and/or eschar (tan, brown or black) in the wound bed. quarter-sized/purple  (Active)  02/22/19 2330  Location: Coccyx  Location Orientation: Medial  Staging: Unstageable - Full thickness tissue loss in which the base of the injury is covered by slough (yellow, tan, gray, green or brown) and/or eschar (tan, brown or black) in the wound bed.  Wound Description (Comments): quarter-sized/purple   Present on Admission: No     Pressure Injury 03/01/19 Ear Left Stage 2 -  Partial thickness loss of dermis presenting as a shallow open injury with a red, pink wound bed without slough. (Active)  03/01/19 2217  Location: Ear  Location Orientation: Left  Staging: Stage 2 -  Partial thickness loss of dermis presenting as a shallow open injury with a red, pink wound bed without slough.  Wound Description (Comments):   Present on Admission: No     Pressure Injury 03/02/19 Ear Right Stage 1 -  Intact skin with non-blanchable redness of a localized area usually over a bony prominence. (Active)  03/02/19 2137  Location: Ear  Location Orientation: Right  Staging: Stage 1 -  Intact skin with non-blanchable redness of a localized area usually over a bony prominence.  Wound Description (Comments):   Present on Admission: No    Consultants:   Pulmonary    Procedures:   Peg and trach     Subjective: Patient is feeling well, no chest pain or dyspnea, he is requesting to eat by mouth, no nausea or vomiting and tolerating tube feedings well. Patient continue to have copious secretions per trach, able to clear.   Objective: Vitals:   03/14/19 0730 03/14/19 0739 03/14/19 0745 03/14/19 0800  BP: 125/83  131/86 127/84   Pulse: (!) 112 (!) 111 (!) 114 (!) 113  Resp: 18 (!) 26 20 (!) 25  Temp:   98.4 F (36.9 C)   TempSrc:   Oral   SpO2: 97% 99% 96% 100%  Weight:      Height:        Intake/Output Summary (Last 24 hours) at 03/14/2019 1002 Last data filed at 03/14/2019 0715 Gross per 24 hour  Intake 1260 ml  Output 1000 ml  Net 260 ml   Filed Weights   03/11/19 0500 03/13/19 0500 03/14/19 0500  Weight: 68.2 kg 70.1 kg 71 kg    Examination:   General: Not in pain or dyspnea, deconditioned  Neurology: Awake and alert, non focal  E ENT: mild pallor, no icterus, oral mucosa moist, trach in place.  Cardiovascular: No JVD. S1-S2 present, rhythmic, no gallops, rubs, or murmurs. No lower extremity edema. Pulmonary: positive breath sounds bilaterally, adequate air movement, no wheezing, rhonchi or rales. Gastrointestinal. Abdomen with, no organomegaly, non tender, no rebound or guarding. Peg tube in place.  Skin. No rashes. Necrotic toes bilateral feet.  Musculoskeletal: no joint deformities     Data Reviewed: I have personally reviewed following labs and imaging studies  CBC: Recent Labs  Lab 03/13/19 0534 03/14/19 0636  WBC 15.4* 14.8*  NEUTROABS 12.4* 12.2*  HGB 10.1* 9.5*  HCT 33.9* 32.7*  MCV 89.2 91.3  PLT 616* 917*   Basic Metabolic Panel: Recent Labs  Lab 03/08/19 0730 03/09/19 0500 03/10/19 0327 03/11/19 0352 03/12/19 0618 03/13/19 0534 03/14/19 0636  NA 140   < > 140 141 144 148* 145  K 3.2*   < > 3.2* 3.2* 4.0 3.9 4.0  CL 100   < > 103 104 110 112* 110  CO2 28   < > 26 24 25 26 27   GLUCOSE 113*   < > 134* 87 254* 140* 253*  BUN 15   < > 19 18 26* 26* 28*  CREATININE 0.32*   < > 0.49* 0.63 0.66 0.51* 0.44*  CALCIUM 8.5*   < > 8.6* 8.7* 8.6* 8.5* 8.5*  MG 2.3  --   --   --   --   --   --    < > = values in this interval not displayed.   GFR: Estimated Creatinine Clearance: 97.5 mL/min (A) (by C-G formula based on SCr of 0.44 mg/dL (L)). Liver Function Tests: No  results for input(s): AST, ALT, ALKPHOS, BILITOT, PROT, ALBUMIN in the last 168 hours. No results for input(s): LIPASE, AMYLASE in the last 168 hours. No results for input(s): AMMONIA in the last 168 hours. Coagulation Profile: No results for input(s): INR, PROTIME in the last 168 hours. Cardiac Enzymes: No results for input(s): CKTOTAL, CKMB, CKMBINDEX, TROPONINI in the last 168 hours. BNP (last 3 results) No results for input(s): PROBNP in the last 8760 hours. HbA1C: No results for input(s): HGBA1C in the last 72 hours. CBG: Recent Labs  Lab 03/13/19 1539 03/13/19 2026 03/14/19 0002 03/14/19 0338 03/14/19 0759  GLUCAP 156* 86 263* 197* 251*   Lipid Profile: No results for input(s): CHOL, HDL, LDLCALC, TRIG, CHOLHDL, LDLDIRECT in the last 72 hours. Thyroid Function Tests: No results for input(s): TSH, T4TOTAL, FREET4, T3FREE, THYROIDAB in the last 72 hours. Anemia Panel: No results for input(s): VITAMINB12, FOLATE, FERRITIN, TIBC, IRON, RETICCTPCT in the last 72 hours.    Radiology Studies: I have reviewed all of the imaging during this hospital visit personally     Scheduled Meds: . apixaban  5 mg Per Tube BID  . vitamin C  500 mg Per Tube Daily  . atorvastatin  20 mg Per Tube q1800  . chlorhexidine gluconate (MEDLINE KIT)  15 mL Mouth Rinse BID  . Chlorhexidine Gluconate Cloth  6 each Topical Daily  . doxazosin  1 mg Per Tube Daily  . famotidine  20 mg Per Tube BID  . feeding supplement (PRO-STAT SUGAR FREE 64)  30 mL Per Tube BID  . folic acid  1 mg Per Tube Daily  . insulin aspart  0-20 Units Subcutaneous Q4H  . insulin aspart  2 Units Subcutaneous Q4H  . insulin detemir  17 Units Subcutaneous BID  . ipratropium-albuterol  3 mL Nebulization 4x daily  . mouth rinse  15 mL Mouth Rinse 10 times per day  . multivitamin  15 mL Per Tube Daily  . nutrition supplement (JUVEN)  1 packet Per Tube BID BM  . polyethylene glycol  17 g Per Tube Daily  . scopolamine  1  patch Transdermal Q72H  . senna-docusate  1 tablet Per Tube BID  . sodium chloride flush  10-40 mL Intracatheter Q12H  . thiamine  100 mg Per Tube Daily  . zinc sulfate  220 mg Per Tube Daily   Continuous Infusions: . sodium chloride Stopped (03/12/19 1606)  . feeding supplement (OSMOLITE 1.5 CAL) 1,000 mL (03/13/19 0921)     LOS: 36 days        Yazan Gatling Gerome Apley, MD

## 2019-03-14 NOTE — Progress Notes (Signed)
Patient received in bed , somewhat more focused today, able to gesture and follow directions. Patient with copious amount whitish secretions, trach care given, suctioned as needed. Peg tube in place, tolerating Osmolyte 1.5 as ordered at 65cc/hr. repositioned and made comfortable, VSS afebrile/.

## 2019-03-14 NOTE — Progress Notes (Signed)
No further fevers. Follow culture data. Will have floor team check on tomorrow.  Myrla Halsted MD PCCM

## 2019-03-15 DIAGNOSIS — Z430 Encounter for attention to tracheostomy: Secondary | ICD-10-CM

## 2019-03-15 LAB — GLUCOSE, CAPILLARY
Glucose-Capillary: 162 mg/dL — ABNORMAL HIGH (ref 70–99)
Glucose-Capillary: 174 mg/dL — ABNORMAL HIGH (ref 70–99)
Glucose-Capillary: 196 mg/dL — ABNORMAL HIGH (ref 70–99)
Glucose-Capillary: 201 mg/dL — ABNORMAL HIGH (ref 70–99)
Glucose-Capillary: 209 mg/dL — ABNORMAL HIGH (ref 70–99)
Glucose-Capillary: 220 mg/dL — ABNORMAL HIGH (ref 70–99)

## 2019-03-15 LAB — CBC WITH DIFFERENTIAL/PLATELET
Abs Immature Granulocytes: 0.17 10*3/uL — ABNORMAL HIGH (ref 0.00–0.07)
Basophils Absolute: 0.1 10*3/uL (ref 0.0–0.1)
Basophils Relative: 0 %
Eosinophils Absolute: 0.4 10*3/uL (ref 0.0–0.5)
Eosinophils Relative: 2 %
HCT: 32.3 % — ABNORMAL LOW (ref 39.0–52.0)
Hemoglobin: 9.6 g/dL — ABNORMAL LOW (ref 13.0–17.0)
Immature Granulocytes: 1 %
Lymphocytes Relative: 8 %
Lymphs Abs: 1.8 10*3/uL (ref 0.7–4.0)
MCH: 26.6 pg (ref 26.0–34.0)
MCHC: 29.7 g/dL — ABNORMAL LOW (ref 30.0–36.0)
MCV: 89.5 fL (ref 80.0–100.0)
Monocytes Absolute: 0.7 10*3/uL (ref 0.1–1.0)
Monocytes Relative: 3 %
Neutro Abs: 19.1 10*3/uL — ABNORMAL HIGH (ref 1.7–7.7)
Neutrophils Relative %: 86 %
Platelets: 484 10*3/uL — ABNORMAL HIGH (ref 150–400)
RBC: 3.61 MIL/uL — ABNORMAL LOW (ref 4.22–5.81)
RDW: 17.1 % — ABNORMAL HIGH (ref 11.5–15.5)
WBC: 22.2 10*3/uL — ABNORMAL HIGH (ref 4.0–10.5)
nRBC: 0 % (ref 0.0–0.2)

## 2019-03-15 LAB — CULTURE, RESPIRATORY W GRAM STAIN: Culture: NORMAL

## 2019-03-15 LAB — PROCALCITONIN: Procalcitonin: <0.1 ng/mL

## 2019-03-15 NOTE — Progress Notes (Addendum)
PROGRESS NOTE    Andrew Bruce  DTO:[id number] DOB: September 16, 1965 DOA: 02/06/2019 PCP: Patient, No Pcp Per    Brief Narrative:  Patient was admitted to the hospital with a working diagnosis of severe acute hypoxic respiratory failure due to SARS COVID-19 viral pneumonia.Prolonged hospitalization, arterial/venous thromboembolic complications(aortic thrombus/CVA/PE),now status post tracheostomy and PEG tube  54 year old male who presented with worsening dyspnea for 2 days in the setting of 10 days of COVID-19 illness. He was initially managed at home after his diagnosis of COVID-19, about a week prior to hospitalization. 48 hours prior hospitalization he developed severe and rapid progressive dyspnea. In the emergency department he was severely hypoxic and placed on nonrebreather mask along with nasal cannula.   24 hours after hospitalization he was placed on invasive mechanical ventilation due to severe and progressive respiratory failure due to ARDS.  His hospitalization has been complicated by aortic thrombus, multiple CVAs and pneumothoraces. 01/25patient required bilateral chest tubes and he was diagnosed with CVA.  02/16.Patient underwent tracheostomy, thefollowing day02/17his chest tubes were removed.  He was finally liberated to trach collar on February 25,requiring10 L/min, 40% FiO2.  Cuff less 6-0 trach since 2/25.    02/26transferred care to [facility].  Patient has remained hemodynamically stable, but continue to have significant trach secretions, intermittent fever and worsening leukocytosis.    Assessment & Plan:   Active Problems:   COVID-19   Acute respiratory failure with hypoxia (HCC)   Pneumothorax, right   Cerebral embolism with cerebral infarction   ARDS (adult respiratory distress syndrome) (HCC)   On mechanically assisted ventilation (HCC)   Goals of care, counseling/discussion   Palliative care encounter   Pressure injury of skin   1.  Acute hypoxemic respiratory failure due to SARS COVID 19 viral pneumonia/ ARDS. Prolonged hospitalization, complicated by bilateral pneumothorax, now sp trach.  Continue with 10 L/ min with 40% Fi02 with good toleration. His wbc is up to 22,2 with T max 100.9 F. Trach aspirate with respiratory flora.   Continue with aggressive pulmonary clearing techniques with chest pt, frequent suctioning, PT, OT and speech therapy. Out of bed to chair as tolerated.On scopolamine and robinul for secretions.   Will follow closely wbc and temperature curve. Recent chest film and procalcitonin not suggestive of bacterial pneumonia. His abdomen is soft and non tender. Follow on cultures. Patient has copious trach secretions but not increased oxygen requirements.   Patient will need trach facility for discharge.   2. 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 Persistentright upper extremity weakness. Awake and follows commands, Continue anticoagulation with apixaban.  Aspiration precautions, physical and occupational therapy.  Positive dry necrosis, multiple toes,continue with supportive care.  3. Pulmonary embolism/ acute right heart failure.Onapixaban for anticoagulation.   4. Acute metabolic encephalopathy. Likely multifactorial.Patient is awake and alert. No signs of confusion or agitation, following commands. Continue speech therapy, currently with high secretions to try Passy Muir valve.    5. Hypokalemia. Patient tolerating well tube feedings, will follow on renal panel in am.   6. Uncontrolled T2DM (Hgb X8P 38,2) complicated with steroid induced hyperglycemia. Dyslipidemia Glucose this am 174, with basal insulin has been increased to 22 units bid, continue with aspart 2 untis q 4 H. Continue withatorvastatin.   7. Skin pressure ulcer. Coccyx unstageable not present on admission, bilateral ears stage 1.On local wound care.   DVT prophylaxis: apixaban   Code  Status: full Family Communication: no family at the bedside  Disposition Plan/ discharge barriers:  Patient continue acutely ill, with worsening leukocytosis.    Nutrition Status: Nutrition Problem: Inadequate oral intake Etiology: acute illness Signs/Symptoms: NPO status Interventions: Tube feeding     Skin Documentation: Pressure Injury 02/22/19 Coccyx Medial Unstageable - Full thickness tissue loss in which the base of the injury is covered by slough (yellow, tan, gray, green or brown) and/or eschar (tan, brown or black) in the wound bed. quarter-sized/purple  (Active)  02/22/19 2330  Location: Coccyx  Location Orientation: Medial  Staging: Unstageable - Full thickness tissue loss in which the base of the injury is covered by slough (yellow, tan, gray, green or brown) and/or eschar (tan, brown or black) in the wound bed.  Wound Description (Comments): quarter-sized/purple   Present on Admission: No     Pressure Injury 03/01/19 Ear Left Stage 2 -  Partial thickness loss of dermis presenting as a shallow open injury with a red, pink wound bed without slough. (Active)  03/01/19 2217  Location: Ear  Location Orientation: Left  Staging: Stage 2 -  Partial thickness loss of dermis presenting as a shallow open injury with a red, pink wound bed without slough.  Wound Description (Comments):   Present on Admission: No     Pressure Injury 03/02/19 Ear Right Stage 1 -  Intact skin with non-blanchable redness of a localized area usually over a bony prominence. (Active)  03/02/19 2137  Location: Ear  Location Orientation: Right  Staging: Stage 1 -  Intact skin with non-blanchable redness of a localized area usually over a bony prominence.  Wound Description (Comments):   Present on Admission: No     Consultants:   Pulmonary   Procedures:   Trach and peg   Subjective: Patient continue to have copious trach secretions, he denies any pain or dyspnea, he has been tolerating well  tube feedings. Abel to cough secretions well. Fever spike this am.   Objective: Vitals:   03/15/19 0306 03/15/19 0346 03/15/19 0733 03/15/19 0826  BP:    116/71  Pulse:   (!) 109 (!) 106  Resp:  20 (!) 25 (!) 25  Temp:    97.9 F (36.6 C)  TempSrc:    Axillary  SpO2:  96% 97% 93%  Weight: 70.7 kg     Height:        Intake/Output Summary (Last 24 hours) at 03/15/2019 0913 Last data filed at 03/14/2019 1800 Gross per 24 hour  Intake 698.75 ml  Output --  Net 698.75 ml   Filed Weights   03/13/19 0500 03/14/19 0500 03/15/19 0306  Weight: 70.1 kg 71 kg 70.7 kg    Examination:   General: Not in pain or dyspnea. Deconditioned  Neurology: Awake and alert, non focal  E ENT: no pallor, no icterus, oral mucosa moist. Trach in place Cardiovascular: No JVD. S1-S2 present, rhythmic, no gallops, rubs, or murmurs. No lower extremity edema. Pulmonary: positive breath sounds bilaterally, adequate air movement, no wheezing, scattered rales at bases on anterior ausculation. Gastrointestinal. Abdomen with no organomegaly, non tender, no rebound or guarding Skin. Multiple necrotic toes bilateral feet.  Musculoskeletal: no joint deformities.    Left foot.     Right foot.      Data Reviewed: I have personally reviewed following labs and imaging studies  CBC: Recent Labs  Lab 03/13/19 0534 03/14/19 0636 03/15/19 0300  WBC 15.4* 14.8* 22.2*  NEUTROABS 12.4* 12.2* 19.1*  HGB 10.1* 9.5* 9.6*  HCT 33.9* 32.7* 32.3*  MCV 89.2 91.3 89.5  PLT 616*  541* 264*   Basic Metabolic Panel: Recent Labs  Lab 03/10/19 0327 03/11/19 0352 03/12/19 0618 03/13/19 0534 03/14/19 0636  NA 140 141 144 148* 145  K 3.2* 3.2* 4.0 3.9 4.0  CL 103 104 110 112* 110  CO2 26 24 25 26 27   GLUCOSE 134* 87 254* 140* 253*  BUN 19 18 26* 26* 28*  CREATININE 0.49* 0.63 0.66 0.51* 0.44*  CALCIUM 8.6* 8.7* 8.6* 8.5* 8.5*   GFR: Estimated Creatinine Clearance: 97.5 mL/min (A) (by C-G formula based on SCr  of 0.44 mg/dL (L)). Liver Function Tests: No results for input(s): AST, ALT, ALKPHOS, BILITOT, PROT, ALBUMIN in the last 168 hours. No results for input(s): LIPASE, AMYLASE in the last 168 hours. No results for input(s): AMMONIA in the last 168 hours. Coagulation Profile: No results for input(s): INR, PROTIME in the last 168 hours. Cardiac Enzymes: No results for input(s): CKTOTAL, CKMB, CKMBINDEX, TROPONINI in the last 168 hours. BNP (last 3 results) No results for input(s): PROBNP in the last 8760 hours. HbA1C: No results for input(s): HGBA1C in the last 72 hours. CBG: Recent Labs  Lab 03/14/19 1639 03/14/19 2111 03/15/19 0016 03/15/19 0349 03/15/19 0721  GLUCAP 206* 251* 196* 174* 201*   Lipid Profile: No results for input(s): CHOL, HDL, LDLCALC, TRIG, CHOLHDL, LDLDIRECT in the last 72 hours. Thyroid Function Tests: No results for input(s): TSH, T4TOTAL, FREET4, T3FREE, THYROIDAB in the last 72 hours. Anemia Panel: No results for input(s): VITAMINB12, FOLATE, FERRITIN, TIBC, IRON, RETICCTPCT in the last 72 hours.    Radiology Studies: I have reviewed all of the imaging during this hospital visit personally     Scheduled Meds: . apixaban  5 mg Per Tube BID  . vitamin C  500 mg Per Tube Daily  . atorvastatin  20 mg Per Tube q1800  . chlorhexidine gluconate (MEDLINE KIT)  15 mL Mouth Rinse BID  . Chlorhexidine Gluconate Cloth  6 each Topical Daily  . doxazosin  1 mg Per Tube Daily  . famotidine  20 mg Per Tube BID  . feeding supplement (PRO-STAT SUGAR FREE 64)  30 mL Per Tube BID  . folic acid  1 mg Per Tube Daily  . glycopyrrolate  1 mg Per Tube TID  . insulin aspart  0-20 Units Subcutaneous Q4H  . insulin aspart  2 Units Subcutaneous Q4H  . insulin detemir  22 Units Subcutaneous BID  . ipratropium-albuterol  3 mL Nebulization 4x daily  . mouth rinse  15 mL Mouth Rinse 10 times per day  . multivitamin  15 mL Per Tube Daily  . nutrition supplement (JUVEN)  1  packet Per Tube BID BM  . polyethylene glycol  17 g Per Tube Daily  . scopolamine  1 patch Transdermal Q72H  . senna-docusate  1 tablet Per Tube BID  . sodium chloride flush  10-40 mL Intracatheter Q12H  . thiamine  100 mg Per Tube Daily  . zinc sulfate  220 mg Per Tube Daily   Continuous Infusions: . sodium chloride Stopped (03/12/19 1606)  . feeding supplement (OSMOLITE 1.5 CAL) 1,000 mL (03/13/19 0921)     LOS: 37 days        Andrew Bruce Gerome Apley, MD

## 2019-03-15 NOTE — Progress Notes (Signed)
Temp 100.9 rectal, given tylenol 650 via G-tube, also given metoprolol  5mg  due to sustained HR greater than 120, cleaned and made comfortable: suctioned copious amount large thick mucus from tracheostomy. Trach collar in place with 40% FIO2.

## 2019-03-15 NOTE — Progress Notes (Signed)
  Speech Language Pathology Treatment: [name] Speaking valve  Patient Details Name: Andrew Bruce MRN: [id number] DOB: November 24, 1965 Today's Date: 03/15/2019 Time: 2633-3545 SLP Time Calculation (min) (ACUTE ONLY): 15 min  Assessment / Plan / Recommendation Clinical Impression  Pt has almost constant coughing given secretions. 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 Pt would be an excellent candidate for respiratory muscle strength training and will address this next session. Today however, attempted to target pts ability to increase duration of inspiration (recruit the diaphragm) with pursed lip breathing task with visual feedback (blowing on a paper towel). Pt was able to return demonstrate x3, but needed max emotional encouragement each trial. Will continue efforts and consider MBS in near future based on progress. Would like to see slightly increase endurance for participation first.   HPI HPI: [name] without a past medical history admitted 1/23 with 48 hours of worsening shortness of breath in the context of a 10-day Covid illness (dry cough, lost taste/smell). Tested positive for Covid 01/26/19. Decompensated requiring intubation 1/24 and trach'd 2/16. 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 Additional smaller right frontal convexity infarct also noted, bilateral pneumothoraces & aortic clot. Pt has developed gangrene of toes as well.        SLP Plan  Continue with current plan of care       Recommendations         Patient may use Passy-Muir Speech Valve: Intermittently with supervision;During all therapies with supervision PMSV Supervision: Full         Plan: Continue with  current plan of care       GO               [name], MA CCC-SLP  Acute Rehabilitation Services Pager [phone] Office [phone]  [name] 03/15/2019, 1:08 PM

## 2019-03-15 NOTE — Progress Notes (Signed)
NAME:  Andrew Bruce, MRN:  [id number], DOB:  03/14/1965, LOS: 34 ADMISSION DATE:  02/06/2019, CONSULTATION DATE:  02/06/19 REFERRING MD:  ER, CHIEF COMPLAINT:  SOB   Brief History   [name] without a past medical history admitted 1/23 with 48 hours of worsening shortness of breath in the context of a 10-day Covid illness (dry cough, lost taste/smell).  Chest x-ray and vitals are consistent with a worsening COVID ARDS.  Admitted for evaluation, developed worsening shortness of breath and increasing O2 needs. Decompensated requiring intubation 1/24.  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   Past Medical History  None known prior to admit   Significant Hospital Events   1/23 Admit  1/24 Decompensated, tx to ICU, intubated 1/25 b/l chest tubes placed 1/25 CVC 2/03 flomax started 2/06 rt chest tube connection changed 2/09 Still no BM. Afebrile.  Left chest tube connection changed 2/10 Palliative care meeting > FULL CODE.     2/16 Trach 2/17 Chest tubes removed 2/18 witnessed aspiration 2/19 Cortrak dislodged / removed 2/21 Woke up /  Mental status improved 2/25 10L/40% ATC, up to chair   Consults:  Neurology  Palliative care  Procedures:  ETT 1/24 >> 2/16 Bilateral chest tube placement 1/25 >> 2/17 R Femoral TLC 1/25 >> 2/4 RUE PICC 2/4 >> Trach 2/16 >>   Significant Diagnostic Tests:  CT Head 2/2 >> no acute intracranial hemorrhage, large area of infarct in the left occipital lobe, likely subacute CTA Chest 2/3 >> LLL segmental PE, no CT evidence of right heart strain, multifocal PNA, background emphysema and probable degree of interstitial fibrosis ECHO 2/3 >> LVEF 55-60%, no WMA, hypokinetic right ventricular free wall, McConnells sign present. RV findings consistent with acute cor pulmonale, small pericardial effusion, mildly elevated pulmonary artery systolic pressure  Micro Data:  COVID 1/12 >>  positive  PCT 1/23 >> Neg Blood cx 2/5 >> negative Sputum 2/6 >> abundant diphtheroids Corynebacterium species  Sputum 2/18 >> normal flora  Antimicrobials:  Remdesivir 1/23>>1/28 Dexamethasone 1/23 >> 2/2  Vanc 2/5 >> 2/12  Zosyn 2/5 >> 2/9 Cefepime 2/9 >> 2/11 Unasyn 2/18 >> 2/23    Interim history/subjective:  Over the weekend had fever and tachycardia. Was cultured. Still copious secretions. Denies pain.  Resting comfortably on trach collar, but per report having copious secretions and coughing.   Objective   Blood pressure 96/78, pulse (!) 107, temperature 98.9 F (37.2 C), temperature source Axillary, resp. rate (!) 25, height 5\' 6"  (1.676 m), weight 70.7 kg, SpO2 97 %.    FiO2 (%):  [40 %] 40 %   Intake/Output Summary (Last 24 hours) at 03/15/2019 1354 Last data filed at 03/15/2019 1200 Gross per 24 hour  Intake 1878.75 ml  Output --  Net 1878.75 ml   Filed Weights   03/13/19 0500 03/14/19 0500 03/15/19 0306  Weight: 70.1 kg 71 kg 70.7 kg   GEN: middle age man in NAD HEENT: trach in place, minimal secretions CV: tachycardic, ext warm PULM: scattered rhonci, mild tachypnea GI: PEG site CDI, tube feeds ongoing EXT: trace anasarca NEURO: moves all 4 ext to command PSYCH: RASS 1 SKIN: diaphoretic  Resp cultures 2/27 - normal respiratory flora.   Resolved Hospital Problem list   Oliguria, hypernatremia Bilateral pneumothoraces Ventilator associated pneumonia  Assessment & Plan:   Acute hypoxemic respiratory failure due to COVID-19 pneumonia, ARDS, PE, left lower lobe pneumonia Tracheostomy Dependence  Profound deconditioning - Cuffless 6-0 in place  since 2/25 - Continue TC, pulmonary toileting as ordered: CPT, duonebs, mucinex - PMV with speech as tolerated.   Fever, WBC elevated- sputum culture 2/27 sent with normal respiratory flora.   Durel Salts, MD Pulmonary and Critical Care Medicine [facility] Pager:  [phone] Office:[phone]

## 2019-03-16 DIAGNOSIS — E785 Hyperlipidemia, unspecified: Secondary | ICD-10-CM

## 2019-03-16 DIAGNOSIS — E1169 Type 2 diabetes mellitus with other specified complication: Secondary | ICD-10-CM

## 2019-03-16 LAB — BASIC METABOLIC PANEL
Anion gap: 8 (ref 5–15)
BUN: 24 mg/dL — ABNORMAL HIGH (ref 6–20)
CO2: 28 mmol/L (ref 22–32)
Calcium: 8.7 mg/dL — ABNORMAL LOW (ref 8.9–10.3)
Chloride: 111 mmol/L (ref 98–111)
Creatinine, Ser: 0.41 mg/dL — ABNORMAL LOW (ref 0.61–1.24)
GFR calc Af Amer: >60 mL/min (ref >60)
GFR calc non Af Amer: >60 mL/min (ref >60)
Glucose, Bld: 207 mg/dL — ABNORMAL HIGH (ref 70–99)
Potassium: 3.8 mmol/L (ref 3.5–5.1)
Sodium: 147 mmol/L — ABNORMAL HIGH (ref 135–145)

## 2019-03-16 LAB — CBC WITH DIFFERENTIAL/PLATELET
Abs Immature Granulocytes: 0.13 10*3/uL — ABNORMAL HIGH (ref 0.00–0.07)
Basophils Absolute: 0.1 10*3/uL (ref 0.0–0.1)
Basophils Relative: 0 %
Eosinophils Absolute: 0.3 10*3/uL (ref 0.0–0.5)
Eosinophils Relative: 1 %
HCT: 32.6 % — ABNORMAL LOW (ref 39.0–52.0)
Hemoglobin: 9.7 g/dL — ABNORMAL LOW (ref 13.0–17.0)
Immature Granulocytes: 1 %
Lymphocytes Relative: 8 %
Lymphs Abs: 1.8 10*3/uL (ref 0.7–4.0)
MCH: 26.6 pg (ref 26.0–34.0)
MCHC: 29.8 g/dL — ABNORMAL LOW (ref 30.0–36.0)
MCV: 89.6 fL (ref 80.0–100.0)
Monocytes Absolute: 0.7 10*3/uL (ref 0.1–1.0)
Monocytes Relative: 3 %
Neutro Abs: 19 10*3/uL — ABNORMAL HIGH (ref 1.7–7.7)
Neutrophils Relative %: 87 %
Platelets: 511 10*3/uL — ABNORMAL HIGH (ref 150–400)
RBC: 3.64 MIL/uL — ABNORMAL LOW (ref 4.22–5.81)
RDW: 17 % — ABNORMAL HIGH (ref 11.5–15.5)
WBC: 21.9 10*3/uL — ABNORMAL HIGH (ref 4.0–10.5)
nRBC: 0 % (ref 0.0–0.2)

## 2019-03-16 LAB — GLUCOSE, CAPILLARY
Glucose-Capillary: 119 mg/dL — ABNORMAL HIGH (ref 70–99)
Glucose-Capillary: 158 mg/dL — ABNORMAL HIGH (ref 70–99)
Glucose-Capillary: 171 mg/dL — ABNORMAL HIGH (ref 70–99)
Glucose-Capillary: 182 mg/dL — ABNORMAL HIGH (ref 70–99)
Glucose-Capillary: 183 mg/dL — ABNORMAL HIGH (ref 70–99)
Glucose-Capillary: 184 mg/dL — ABNORMAL HIGH (ref 70–99)
Glucose-Capillary: 206 mg/dL — ABNORMAL HIGH (ref 70–99)
Glucose-Capillary: 278 mg/dL — ABNORMAL HIGH (ref 70–99)

## 2019-03-16 MED ORDER — INSULIN DETEMIR 100 UNIT/ML ~~LOC~~ SOLN
8.0000 [IU] | Freq: Once | SUBCUTANEOUS | Status: AC
  Administered 2019-03-16: 8 [IU] via SUBCUTANEOUS
  Filled 2019-03-16: qty 0.08

## 2019-03-16 MED ORDER — COLLAGENASE 250 UNIT/GM EX OINT
TOPICAL_OINTMENT | Freq: Every day | CUTANEOUS | Status: DC
  Filled 2019-03-16: qty 30

## 2019-03-16 MED ORDER — INSULIN DETEMIR 100 UNIT/ML ~~LOC~~ SOLN
30.0000 [IU] | Freq: Two times a day (BID) | SUBCUTANEOUS | Status: DC
  Administered 2019-03-16 – 2019-03-23 (×14): 30 [IU] via SUBCUTANEOUS
  Filled 2019-03-16 (×16): qty 0.3

## 2019-03-16 NOTE — Progress Notes (Signed)
PROGRESS NOTE    Andrew Bruce  NUU:[id number] DOB: [birth year] DOA: 02/06/2019 PCP: Patient, No Pcp Per    Brief Narrative:  Patient was admitted to the hospital with a working diagnosis of severe acute hypoxic respiratory failure due to SARS COVID-19 viral pneumonia.Prolonged hospitalization, arterial/venous thromboembolic complications(aortic thrombus/CVA/PE/ ischemic toes),now status post tracheostomy and PEG tube.   54 year old male who presented with worsening dyspnea for 2 days in the setting of 10 days of COVID-19 illness. He was initially managed at home after his diagnosis of COVID-19, for a bout a week prior to his hospitalization. Unfortunately 48 hours prior hospitalization he developed severe and rapid progressive dyspnea. In the emergency department he was severely hypoxic and placed on nonrebreather mask along with high flow nasal cannula.   24 hours after hospitalization he was placed on invasive mechanical ventilation due to severe and progressive respiratory failure due to ARDS.  His hospitalization has been complicated by aortic thrombus, multiple CVAs and pneumothoraces. 01/25patient required bilateral chest tubes and he was diagnosed with CVA.  02/16.Patient underwent tracheostomy, thefollowing day02/17his chest tubes were removed.  He was finally liberated to trach collar on February 25,requiring10 L/min, 40% FiO2.  Now on a Cuff less 6-0 trach since 2/25.  02/26transferred [facility] to [facility].  Patient has remained hemodynamically stable, but continue to have significant trach secretions, intermittent fever and persistent leukocytosis. No clinical signs of bacterial infection.  Patient will need a trach facility to continue care.   Assessment & Plan:   Active Problems:   COVID-19   Acute respiratory failure with hypoxia (HCC)   Pneumothorax, right   Cerebral embolism with cerebral infarction   ARDS (adult respiratory distress syndrome)  (HCC)   On mechanically assisted ventilation (HCC)   Goals of care, counseling/discussion   Palliative care encounter   Pressure injury of skin    1. Acute hypoxemic respiratory failure due to SARS COVID 19 viral pneumonia/ ARDS. Prolonged hospitalization, complicated by bilateral pneumothorax, now sp trach.  Has remain stable at 10 L/ min with 40% Fi02. wbc down to 21,9. Trach aspirate with respiratory flora. Persistent copious trach secretions.   Patient needsaggressive pulmonary clearing techniques with chest pt, frequent suctioning/ deep trach suction per RT. Continue with PT, OT and speech therapy. Continue with scopolamine and robinul.  Out of bed to chair as tolerated. Keep head of the bed at 45 angle. On bronchodilator therapy and antitussive agents.   Continue very weak and deconditioned, will need trach facility for discharge.   2. 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 Persistentright upper extremity weakness. Awake and follows commands,On apixaban for anticoagulation, continue with aspiration precautions, physical and occupational therapy.  Positive dry necrosis, multiple toes. No significant pain, continue close monitoring.   3. Pulmonary embolism/ acute right heart failure.Continue withapixaban for anticoagulation.   4. Acute metabolic encephalopathy. Likely multifactorial.Patient has been awake and alert, with no signs of confusion or agitation, following commands. On speech therapy, but due to high secretions unable to use [employer] valve.    5. Hypokalemia. Renal function stable with serum cr at 0,41, K at 3,8 and serum bicarbonate at 24. Will continue tube feedings and follow up renal panel in am.   6. Uncontrolled T2DM (Hgb H4V 42,5) complicated with steroid induced hyperglycemia. DyslipidemiaGlucose this am 274, yesterday patient used about 33 units of short acting insulin plus basal insulin 22units bid. Will increase  basal to 30 bid and continue sliding scale for glucose cover and monitoring.  Continue with atorvastatin.   7. Skin pressure ulcer. Coccyx unstageable not present on admission, bilateral ears stage 1.Continue with local wound care.   DVT prophylaxis: apixaban   Code Status: full Family Communication: no family at the bedside  Disposition Plan/ discharge barriers: Patient continue acutely ill, with worsening leukocytosis.   Nutrition Status: Nutrition Problem: Inadequate oral intake Etiology: acute illness Signs/Symptoms: NPO status Interventions: Tube feeding     Skin Documentation: Pressure Injury 02/22/19 Coccyx Medial Unstageable - Full thickness tissue loss in which the base of the injury is covered by slough (yellow, tan, gray, green or brown) and/or eschar (tan, brown or black) in the wound bed. quarter-sized/purple  (Active)  02/22/19 2330  Location: Coccyx  Location Orientation: Medial  Staging: Unstageable - Full thickness tissue loss in which the base of the injury is covered by slough (yellow, tan, gray, green or brown) and/or eschar (tan, brown or black) in the wound bed.  Wound Description (Comments): quarter-sized/purple   Present on Admission: No     Pressure Injury 03/01/19 Ear Left Stage 2 -  Partial thickness loss of dermis presenting as a shallow open injury with a red, pink wound bed without slough. (Active)  03/01/19 2217  Location: Ear  Location Orientation: Left  Staging: Stage 2 -  Partial thickness loss of dermis presenting as a shallow open injury with a red, pink wound bed without slough.  Wound Description (Comments):   Present on Admission: No     Pressure Injury 03/02/19 Ear Right Stage 1 -  Intact skin with non-blanchable redness of a localized area usually over a bony prominence. (Active)  03/02/19 2137  Location: Ear  Location Orientation: Right  Staging: Stage 1 -  Intact skin with non-blanchable redness of a localized area usually over  a bony prominence.  Wound Description (Comments):   Present on Admission: No     Consultants:   Pulmonary   Procedures:   Trach and Peg.  Subjective: Patient feeling well, continue to have trach secretions, no nausea or vomiting and tolerating tube feedings, continue to be very weak and deconditioned. Right upper extremity weakness.   Objective: Vitals:   03/16/19 0724 03/16/19 0803 03/16/19 0812 03/16/19 0928  BP:  (!) 105/52 105/73   Pulse: (!) 105 (!) 105 (!) 105 (!) 104  Resp: 19 (!) 26 20 20   Temp:  98.5 F (36.9 C)    TempSrc:  Axillary    SpO2: 96% 96% 98% 96%  Weight:      Height:        Intake/Output Summary (Last 24 hours) at 03/16/2019 0935 Last data filed at 03/16/2019 0933 Gross per 24 hour  Intake 1080 ml  Output 300 ml  Net 780 ml   Filed Weights   03/14/19 0500 03/15/19 0306 03/16/19 0438  Weight: 71 kg 70.7 kg 70.8 kg    Examination:   General: deconditioned.  Neurology: Awake and alert, right upper extremity, 3/5, left upper extremities and lower extremities 4/5.  E ENT: no pallor, no icterus, oral mucosa moist/ trach in place.  Cardiovascular: No JVD. S1-S2 present, rhythmic, no gallops, rubs, or murmurs. No lower extremity edema. Pulmonary: positive breath sounds bilaterally, adequate air movement, no wheezing, or rhonchi, scattered rales/ anterior auscultation. Gastrointestinal. Abdomen with no organomegaly, non tender, no rebound or guarding/ peg tube in place.  Skin. Bilateral feet necrotic toes.  Musculoskeletal: no joint deformities    Left foot (03/15/19)    Right foot (03/16/19)    Data  Reviewed: I have personally reviewed following labs and imaging studies  CBC: Recent Labs  Lab 03/13/19 0534 03/14/19 0636 03/15/19 0300 03/16/19 0437  WBC 15.4* 14.8* 22.2* 21.9*  NEUTROABS 12.4* 12.2* 19.1* 19.0*  HGB 10.1* 9.5* 9.6* 9.7*  HCT 33.9* 32.7* 32.3* 32.6*  MCV 89.2 91.3 89.5 89.6  PLT 616* 541* 484* 511*   Basic  Metabolic Panel: Recent Labs  Lab 03/11/19 0352 03/12/19 0618 03/13/19 0534 03/14/19 0636 03/16/19 0437  NA 141 144 148* 145 147*  K 3.2* 4.0 3.9 4.0 3.8  CL 104 110 112* 110 111  CO2 24 25 26 27 28   GLUCOSE 87 254* 140* 253* 207*  BUN 18 26* 26* 28* 24*  CREATININE 0.63 0.66 0.51* 0.44* 0.41*  CALCIUM 8.7* 8.6* 8.5* 8.5* 8.7*   GFR: Estimated Creatinine Clearance: 97.5 mL/min (A) (by C-G formula based on SCr of 0.41 mg/dL (L)). Liver Function Tests: No results for input(s): AST, ALT, ALKPHOS, BILITOT, PROT, ALBUMIN in the last 168 hours. No results for input(s): LIPASE, AMYLASE in the last 168 hours. No results for input(s): AMMONIA in the last 168 hours. Coagulation Profile: No results for input(s): INR, PROTIME in the last 168 hours. Cardiac Enzymes: No results for input(s): CKTOTAL, CKMB, CKMBINDEX, TROPONINI in the last 168 hours. BNP (last 3 results) No results for input(s): PROBNP in the last 8760 hours. HbA1C: No results for input(s): HGBA1C in the last 72 hours. CBG: Recent Labs  Lab 03/15/19 1951 03/16/19 0031 03/16/19 0155 03/16/19 0422 03/16/19 0757  GLUCAP 162* 184* 171* 183* 206*   Lipid Profile: No results for input(s): CHOL, HDL, LDLCALC, TRIG, CHOLHDL, LDLDIRECT in the last 72 hours. Thyroid Function Tests: No results for input(s): TSH, T4TOTAL, FREET4, T3FREE, THYROIDAB in the last 72 hours. Anemia Panel: No results for input(s): VITAMINB12, FOLATE, FERRITIN, TIBC, IRON, RETICCTPCT in the last 72 hours.    Radiology Studies: I have reviewed all of the imaging during this hospital visit personally     Scheduled Meds: . apixaban  5 mg Per Tube BID  . vitamin C  500 mg Per Tube Daily  . atorvastatin  20 mg Per Tube q1800  . chlorhexidine gluconate (MEDLINE KIT)  15 mL Mouth Rinse BID  . Chlorhexidine Gluconate Cloth  6 each Topical Daily  . collagenase   Topical Daily  . doxazosin  1 mg Per Tube Daily  . famotidine  20 mg Per Tube BID  .  feeding supplement (PRO-STAT SUGAR FREE 64)  30 mL Per Tube BID  . folic acid  1 mg Per Tube Daily  . glycopyrrolate  1 mg Per Tube TID  . insulin aspart  0-20 Units Subcutaneous Q4H  . insulin aspart  2 Units Subcutaneous Q4H  . insulin detemir  22 Units Subcutaneous BID  . ipratropium-albuterol  3 mL Nebulization 4x daily  . mouth rinse  15 mL Mouth Rinse 10 times per day  . multivitamin  15 mL Per Tube Daily  . nutrition supplement (JUVEN)  1 packet Per Tube BID BM  . polyethylene glycol  17 g Per Tube Daily  . scopolamine  1 patch Transdermal Q72H  . senna-docusate  1 tablet Per Tube BID  . sodium chloride flush  10-40 mL Intracatheter Q12H  . thiamine  100 mg Per Tube Daily  . zinc sulfate  220 mg Per Tube Daily   Continuous Infusions: . sodium chloride Stopped (03/12/19 1606)  . feeding supplement (OSMOLITE 1.5 CAL) 1,000 mL (03/16/19 0241)  LOS: 38 days        Daniele Yankowski Gerome Apley, MD

## 2019-03-16 NOTE — Progress Notes (Signed)
Palliative:  Mr. Hoselton does appear to be having some improvements and progressing. Family have been consistent in their desire for full aggressive care. Palliative will sign-off at this time. Please re-consult for further follow up and acute palliative needs.   No charge  Yong Channel, NP Palliative Medicine Team Pager [phone] (Please see [email or web address] for schedule) Team Phone [phone]

## 2019-03-16 NOTE — Progress Notes (Signed)
Occupational Therapy Treatment Patient Details Name: Andrew Bruce MRN: [id number] DOB: 03-15-1965 Today's Date: 03/16/2019    History of present illness Pt is a 54 y.o. Spanish-speaking male admitted 02/06/19 with worsening SOB with a 10-day h/o COVID-19 ilness. CXR consistent with COVID ARDS. Worsening respiratory status with decompensation requiring intubation 1/24. 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 S/p trach placement 2/16. Liberated from vent to trach collar 2/25. S/p PEG tube. Pt has also developed gangrene of bilateral toes. No significant PMH.   OT comments  Pt making gradual progress towards OT goals. Pt appearing more awake/alert during today's session, following majority of simple commands given intermittent cues/repetition. Pt also with improved strength in bil UE/LEs. He continues to present with notable R side weakness vs L, but pt able to activate RUE movements both against gravity and via gravity eliminated. Pt tolerated OOB to recliner with +2 maxA (squat pivot), further mobility attempts deferred due to pt orthostatic with positional changes (see general comments below), BP stabilizing with prolonged time being upright. Spouse present and supportive throughout session. Feel he remains an excellent candidate for CIR level therapies at time of discharge. Will continue to follow acutely.   Follow Up Recommendations  CIR;Supervision/Assistance - 24 hour    Equipment Recommendations  Other (comment)(TBD)          Precautions / Restrictions Precautions Precautions: Fall;Other (comment) Precaution Comments: 9L O2 via trach collar (40% FiO2), PEG; orthostatic hypotension Restrictions Weight Bearing Restrictions: No       Mobility Bed Mobility Overal bed mobility: Needs Assistance Bed Mobility: Supine to Sit Rolling: Max assist;+2 for physical assistance   Supine to sit: Max assist;+2 for physical assistance;+2 for  safety/equipment     General bed mobility comments: Initiating movement with repeated verbal cues, slow to follow some commands; able to sit EOB with maxA+2 for trnk elevation and scooting hips to EOB  Transfers Overall transfer level: Needs assistance Equipment used: 2 person hand held assist Transfers: Squat Pivot Transfers     Squat pivot transfers: Max assist;+2 physical assistance     General transfer comment: Able to assist with BUE support on therapists' arms, engaged trunk, requiring maxA+2 to perform squat pivot transfer to recliner in increments with use of bed pad. Standing trials deferred secondary to hypotension    Balance Overall balance assessment: Needs assistance Sitting-balance support: Feet supported;No upper extremity supported Sitting balance-Leahy Scale: Poor Sitting balance - Comments: Briefly maintaining static sitting with flexed forward posture; fluctuating between min-maxA for sitting EOB                                   ADL either performed or assessed with clinical judgement   ADL Overall ADL's : Needs assistance/impaired     Grooming: Maximal assistance;Total assistance;Sitting Grooming Details (indicate cue type and reason): attempted to have pt use yonker for suctioning             Lower Body Dressing: Total assistance;Bed level Lower Body Dressing Details (indicate cue type and reason): donning socks             Functional mobility during ADLs: Maximal assistance;+2 for physical assistance;+2 for safety/equipment(squat pivot transfer) General ADL Comments: pt with improvements in level of alertness today, strength and command following     Vision   Additional Comments: pt able to attend to R visual field - though continue to question visual deficits  as pt with difficulty locating therapist's hand in R visual field - unsure if due to visual deficits vs cognition. Will continue to assess    Perception     Praxis       Cognition Arousal/Alertness: Awake/alert Behavior During Therapy: Flat affect Overall Cognitive Status: Difficult to assess Area of Impairment: Attention;Following commands;Problem solving                   Current Attention Level: Sustained;Selective   Following Commands: Follows one step commands with increased time     Problem Solving: Slow processing;Decreased initiation;Requires verbal cues;Requires tactile cues General Comments: Answering majority of questions appropriately via interpreter, following majority of simple commands as well. Able to verbalize "agua." Recalled 'last time out of bed' as 3 days ago. Attending to R-side; generalized inattention and slowed processing apparent        Exercises Exercises: Other exercises General Exercises - Lower Extremity Long Arc Quad: AROM;Both;Seated Hip Flexion/Marching: AROM;Both;Seated Other Exercises Other Exercises: AAROM to RUE including reaching and gravity eliminated elbow flexion/extension   Shoulder Instructions       General Comments Stratus video interpreter used Estill Bamberg). Wife present and supportive. SpO2 >/92% on 9L O2 trach collar (40% FiO2). BP down to 85/58 in sitting, up to 115/81 post-transfer    Pertinent Vitals/ Pain       Pain Assessment: No/denies pain Pain Intervention(s): Monitored during session  Home Living                                          Prior Functioning/Environment              Frequency  Min 2X/week        Progress Toward Goals  OT Goals(current goals can now be found in the care plan section)  Progress towards OT goals: Progressing toward goals  Acute Rehab OT Goals Patient Stated Goal: Agreeable for out of bed activity OT Goal Formulation: With patient Time For Goal Achievement: 03/27/19 Potential to Achieve Goals: Good  Plan Discharge plan remains appropriate    Co-evaluation    PT/OT/SLP Co-Evaluation/Treatment: Yes Reason for  Co-Treatment: Complexity of the patient's impairments (multi-system involvement);For patient/therapist safety;To address functional/ADL transfers PT goals addressed during session: Mobility/safety with mobility;Balance OT goals addressed during session: Strengthening/ROM;ADL's and self-care      AM-PAC OT "6 Clicks" Daily Activity     Outcome Measure   Help from another person eating meals?: Total Help from another person taking care of personal grooming?: A Lot Help from another person toileting, which includes using toliet, bedpan, or urinal?: Total Help from another person bathing (including washing, rinsing, drying)?: A Lot Help from another person to put on and taking off regular upper body clothing?: Total Help from another person to put on and taking off regular lower body clothing?: Total 6 Click Score: 8    End of Session Equipment Utilized During Treatment: Oxygen(trach)  OT Visit Diagnosis: Hemiplegia and hemiparesis;Other symptoms and signs involving cognitive function;Other abnormalities of gait and mobility (R26.89);Other symptoms and signs involving the nervous system (R29.898) Hemiplegia - Right/Left: Right Hemiplegia - caused by: Cerebral infarction   Activity Tolerance Patient tolerated treatment well   Patient Left in chair;with call bell/phone within reach;with nursing/sitter in room;with family/visitor present   Nurse Communication Mobility status;Need for lift equipment        Time: 3295-1884 OT Time Calculation (  min): 25 min  Charges: OT General Charges $OT Visit: 1 Visit OT Treatments $Self Care/Home Management : 8-22 mins  Marcy Siren, OT Supplemental Rehabilitation Services Pager [phone] Office [phone]    Orlando Penner 03/16/2019, 5:07 PM

## 2019-03-16 NOTE — Consult Note (Signed)
WOC Nurse wound follow up Patient receiving care in [facility] 2W05. Wound type: unstageable to coccyx area Measurement: 6 cm x 2.2 cm Wound bed: 100% thick white slough Drainage (amount, consistency, odor) none Periwound: intact Dressing procedure/placement/frequency: Apply Santyl to the coccyx wound in a nickel thick layer. Cover with a saline moistened gauze, then foam dressing.  PT will do after hydrotherapy. Primary RN to do all other days. I have requested PT for hydrotherapy to area 6 days/week.  Also, I have placed turning instructions. Monitor the wound area(s) for worsening of condition such as: Signs/symptoms of infection,  Increase in size,  Development of or worsening of odor, Development of pain, or increased pain at the affected locations.  Notify the medical team if any of these develop.  Thank you for the consult.  Discussed plan of care with the bedside nurse.  WOC nurse will not follow at this time.  Please re-consult the WOC team if needed.  Helmut Muster, RN, MSN, CWOCN, CNS-BC, pager [phone]

## 2019-03-16 NOTE — Progress Notes (Signed)
Inpatient Rehabilitation-Admissions Coordinator   [facility] will place IP Rehab consult order in chart per protocol.   Cheri Rous, OTR/L  Rehab Admissions Coordinator  [phone] 03/16/2019 4:49 PM

## 2019-03-16 NOTE — Progress Notes (Signed)
Physical Therapy Treatment Patient Details Name: Andrew Bruce MRN: [id number] DOB: September 23, 1965 Today's Date: 03/16/2019    History of Present Illness Pt is a 54 y.o. Spanish-speaking male admitted 02/06/19 with worsening SOB with a 10-day h/o COVID-19 ilness. CXR consistent with COVID ARDS. Worsening respiratory status with decompensation requiring intubation 1/24. 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 S/p trach placement 2/16. Liberated from vent to trach collar 2/25. S/p PEG tube. Pt has also developed gangrene of bilateral toes. No significant PMH.   PT Comments    Pt progressing well with mobility; now demonstrates at least 3/5 strength in bilateral knee extensors/flexors and hip flexors. Remains limited by decreased activity tolerance, orthostatic hypotension (see values below), generalized weakness, slowed processing, and poor core control. More engaged this session and able to provide assist; performed squat pivot to recliner with assist+2; standing trials limited by hypotension. SpO2 >/92% on 9L O2 via trach collar (40% FiO2). Wife present and supportive. Continue to recommend intensive CIR-level therapies to maximize functional mobility and independence.  Orthostatic BPs Sitting 85/58  Sitting after 2 min 104/71  Post-transfer to recliner 94/67  Sitting in recliner after 2 min 115/81     Follow Up Recommendations  CIR;Supervision/Assistance - 24 hour     Equipment Recommendations  (defer to next venue)    Recommendations for Other Services       Precautions / Restrictions Precautions Precautions: Fall;Other (comment) Precaution Comments: 9L O2 via trach collar (40% FiO2), PEG; orthostatic hypotension Restrictions Weight Bearing Restrictions: No    Mobility  Bed Mobility Overal bed mobility: Needs Assistance Bed Mobility: Supine to Sit Rolling: Max assist;+2 for physical assistance         General bed mobility comments:  Initiating movement with repeated verbal cues, slow to follow some commands; able to sit EOB with maxA+2 for trnk elevation and scooting hips to EOB  Transfers Overall transfer level: Needs assistance Equipment used: 2 person hand held assist Transfers: Squat Pivot Transfers     Squat pivot transfers: Max assist;+2 physical assistance     General transfer comment: Able to assist with BUE support on therapists' arms, engaged trunk, requiring maxA+2 to perform squat pivot transfer to recliner in increments with use of bed pad. Standing trials deferred secondary to hypotension  Ambulation/Gait                 Stairs             Wheelchair Mobility    Modified Rankin (Stroke Patients Only)       Balance Overall balance assessment: Needs assistance   Sitting balance-Leahy Scale: Poor Sitting balance - Comments: Briefly maintaining static sitting with flexed forward posture; fluctuating between min-maxA for sitting EOB                                    Cognition Arousal/Alertness: Awake/alert Behavior During Therapy: Flat affect Overall Cognitive Status: Difficult to assess Area of Impairment: Attention;Following commands;Problem solving                   Current Attention Level: Sustained;Selective   Following Commands: Follows one step commands with increased time     Problem Solving: Slow processing;Decreased initiation;Requires verbal cues;Requires tactile cues General Comments: Answering majority of questions appropriately via interpreter, following majority of simple commands as well. Able to verbalize "agua." Recalled 'last time out of bed' as 3 days  ago. Attending to R-side; generalized inattention and slowed processing apparent      Exercises General Exercises - Lower Extremity Long Arc Quad: AROM;Both;Seated Hip Flexion/Marching: AROM;Both;Seated    General Comments General comments (skin integrity, edema, etc.): Stratus  video interpreter used Marchelle Folks). Wife present and supportive. SpO2 >/92% on 9L O2 trach collar (40% FiO2). BP down to 85/58 in sitting, up to 115/81 post-transfer      Pertinent Vitals/Pain Pain Assessment: No/denies pain Pain Intervention(s): Monitored during session    Home Living                      Prior Function            PT Goals (current goals can now be found in the care plan section) Acute Rehab PT Goals Patient Stated Goal: Agreeable for out of bed activity PT Goal Formulation: With patient/family Time For Goal Achievement: 03/27/19 Progress towards PT goals: Progressing toward goals    Frequency    Min 3X/week      PT Plan Current plan remains appropriate    Co-evaluation PT/OT/SLP Co-Evaluation/Treatment: Yes Reason for Co-Treatment: Complexity of the patient's impairments (multi-system involvement);Necessary to address cognition/behavior during functional activity;For patient/therapist safety;To address functional/ADL transfers PT goals addressed during session: Mobility/safety with mobility;Balance        AM-PAC PT "6 Clicks" Mobility   Outcome Measure  Help needed turning from your back to your side while in a flat bed without using bedrails?: A Lot Help needed moving from lying on your back to sitting on the side of a flat bed without using bedrails?: A Lot Help needed moving to and from a bed to a chair (including a wheelchair)?: A Lot Help needed standing up from a chair using your arms (e.g., wheelchair or bedside chair)?: A Lot Help needed to walk in hospital room?: A Lot Help needed climbing 3-5 steps with a railing? : Total 6 Click Score: 11    End of Session Equipment Utilized During Treatment: Oxygen Activity Tolerance: Patient tolerated treatment well Patient left: in chair;with call bell/phone within reach;with nursing/sitter in room;with family/visitor present Nurse Communication: Mobility status;Need for lift equipment PT  Visit Diagnosis: Muscle weakness (generalized) (M62.81)     Time: 4696-2952 PT Time Calculation (min) (ACUTE ONLY): 25 min  Charges:  $Therapeutic Activity: 8-22 mins                    Ina Homes, PT, DPT Acute Rehabilitation Services  Pager [phone] Office [phone]  [name] 03/16/2019, 4:27 PM

## 2019-03-17 LAB — CBC WITH DIFFERENTIAL/PLATELET
Abs Immature Granulocytes: 0.09 10*3/uL — ABNORMAL HIGH (ref 0.00–0.07)
Basophils Absolute: 0.1 10*3/uL (ref 0.0–0.1)
Basophils Relative: 0 %
Eosinophils Absolute: 0.3 10*3/uL (ref 0.0–0.5)
Eosinophils Relative: 2 %
HCT: 33.3 % — ABNORMAL LOW (ref 39.0–52.0)
Hemoglobin: 9.7 g/dL — ABNORMAL LOW (ref 13.0–17.0)
Immature Granulocytes: 1 %
Lymphocytes Relative: 14 %
Lymphs Abs: 2 10*3/uL (ref 0.7–4.0)
MCH: 26.6 pg (ref 26.0–34.0)
MCHC: 29.1 g/dL — ABNORMAL LOW (ref 30.0–36.0)
MCV: 91.5 fL (ref 80.0–100.0)
Monocytes Absolute: 0.5 10*3/uL (ref 0.1–1.0)
Monocytes Relative: 4 %
Neutro Abs: 11.4 10*3/uL — ABNORMAL HIGH (ref 1.7–7.7)
Neutrophils Relative %: 79 %
Platelets: 491 10*3/uL — ABNORMAL HIGH (ref 150–400)
RBC: 3.64 MIL/uL — ABNORMAL LOW (ref 4.22–5.81)
RDW: 17 % — ABNORMAL HIGH (ref 11.5–15.5)
WBC: 14.4 10*3/uL — ABNORMAL HIGH (ref 4.0–10.5)
nRBC: 0 % (ref 0.0–0.2)

## 2019-03-17 LAB — BASIC METABOLIC PANEL
Anion gap: 8 (ref 5–15)
BUN: 27 mg/dL — ABNORMAL HIGH (ref 6–20)
CO2: 28 mmol/L (ref 22–32)
Calcium: 8.5 mg/dL — ABNORMAL LOW (ref 8.9–10.3)
Chloride: 108 mmol/L (ref 98–111)
Creatinine, Ser: 0.4 mg/dL — ABNORMAL LOW (ref 0.61–1.24)
GFR calc Af Amer: >60 mL/min (ref >60)
GFR calc non Af Amer: >60 mL/min (ref >60)
Glucose, Bld: 213 mg/dL — ABNORMAL HIGH (ref 70–99)
Potassium: 3.7 mmol/L (ref 3.5–5.1)
Sodium: 144 mmol/L (ref 135–145)

## 2019-03-17 LAB — GLUCOSE, CAPILLARY
Glucose-Capillary: 170 mg/dL — ABNORMAL HIGH (ref 70–99)
Glucose-Capillary: 150 mg/dL — ABNORMAL HIGH (ref 70–99)
Glucose-Capillary: 148 mg/dL — ABNORMAL HIGH (ref 70–99)
Glucose-Capillary: 135 mg/dL — ABNORMAL HIGH (ref 70–99)
Glucose-Capillary: 136 mg/dL — ABNORMAL HIGH (ref 70–99)

## 2019-03-17 MED ORDER — SENNOSIDES-DOCUSATE SODIUM 8.6-50 MG PO TABS: 2.0000 | ORAL_TABLET | Freq: Every evening | ORAL | Status: DC | PRN

## 2019-03-17 MED ORDER — PRO-STAT SUGAR FREE PO LIQD
30.0000 mL | Freq: Three times a day (TID) | ORAL | Status: DC
  Administered 2019-03-17 – 2019-03-23 (×19): 30 mL
  Filled 2019-03-17 (×19): qty 30

## 2019-03-17 MED ORDER — POLYETHYLENE GLYCOL 3350 17 G PO PACK
17.0000 g | PACK | Freq: Every day | ORAL | Status: DC | PRN
  Administered 2019-03-22: 17 g via ORAL
  Filled 2019-03-17 (×2): qty 1

## 2019-03-17 NOTE — Progress Notes (Signed)
Physical Therapy Treatment Patient Details Name: Andrew Bruce MRN: [id number] DOB: 1965/08/15 Today's Date: 03/17/2019    History of Present Illness Pt is a 54 y.o. Spanish-speaking male admitted 02/06/19 with worsening SOB with a 10-day h/o COVID-19 ilness. CXR consistent with COVID ARDS. Worsening respiratory status with decompensation requiring intubation 1/24. 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 S/p trach placement 2/16. Liberated from vent to trach collar 2/25. S/p PEG tube. Pt has also developed gangrene of bilateral toes. No significant PMH.    PT Comments    Pt seen by this PT prior to PT hydrotherapy. Pt reported fatigue today, but agreeable to EOB activity and exercise. Pt required mod-max assist +2 for rolling and moving to and from sitting EOB this session, with poor sitting balance noted due to core weakness and generalized fatigue. Pt sat EOB x5 minutes, requesting return to supine at 5 minutes due to being tired. Pt unable to progress OOB this session, as he was preparing for hydro and required total assist for lateral scooting EOB this day. PT to continue to progress mobility as able.    Follow Up Recommendations  CIR;Supervision/Assistance - 24 hour     Equipment Recommendations  Other (comment)(defer to next venue)    Recommendations for Other Services       Precautions / Restrictions Precautions Precautions: Fall;Other (comment) Precaution Comments: 8LO2 via trach collar (40% FiO2), PEG; orthostatic hypotension Restrictions Weight Bearing Restrictions: No    Mobility  Bed Mobility Overal bed mobility: Needs Assistance Bed Mobility: Sit to Supine;Rolling;Sidelying to Sit Rolling: Mod assist;+2 for safety/equipment;+2 for physical assistance Sidelying to sit: Max assist;+2 for physical assistance;+2 for safety/equipment   Sit to supine: Max assist;+2 for physical assistance;+2 for safety/equipment;HOB elevated    General bed mobility comments: Mod assist for rolling bilaterally in preparation for sitting and hydro, respectively, for trunk translation and tactile cuing UE to assist. Max assist for supine<>sit for trunk and LE management, scooting to and from EOB. Total assist lateral scooting attempted and unsuccessful today.  Transfers                 General transfer comment: unable, pt too fatigued post-sitting EOB  Ambulation/Gait             General Gait Details: unable   Stairs             Wheelchair Mobility    Modified Rankin (Stroke Patients Only) Modified Rankin (Stroke Patients Only) Pre-Morbid Rankin Score: No significant disability Modified Rankin: Severe disability     Balance Overall balance assessment: Needs assistance Sitting-balance support: Feet supported;No upper extremity supported Sitting balance-Leahy Scale: Poor Sitting balance - Comments: Brief periods of unsupported sitting, requires mod assist to correct posterior leaning intermittently. Postural control: Posterior lean     Standing balance comment: unable to attempt today                            Cognition Arousal/Alertness: Awake/alert Behavior During Therapy: Flat affect Overall Cognitive Status: Difficult to assess Area of Impairment: Attention;Following commands;Problem solving                   Current Attention Level: Selective   Following Commands: Follows one step commands with increased time     Problem Solving: Slow processing;Decreased initiation;Requires verbal cues;Requires tactile cues;Difficulty sequencing General Comments: responds appropriately to questions, at times requires repeated cuing and rephrasing to follow commands  Exercises General Exercises - Lower Extremity Long Arc Quad: AROM;Both;10 reps;Seated    General Comments General comments (skin integrity, edema, etc.): VSS, stratus interpreter - Donald Pore [phone]      Pertinent  Vitals/Pain Pain Assessment: No/denies pain Pain Intervention(s): Limited activity within patient's tolerance;Monitored during session    Home Living                      Prior Function            PT Goals (current goals can now be found in the care plan section) Acute Rehab PT Goals PT Goal Formulation: With patient/family Time For Goal Achievement: 03/27/19 Potential to Achieve Goals: Fair Progress towards PT goals: Progressing toward goals    Frequency    Min 3X/week      PT Plan Current plan remains appropriate    Co-evaluation              AM-PAC PT "6 Clicks" Mobility   Outcome Measure  Help needed turning from your back to your side while in a flat bed without using bedrails?: A Lot Help needed moving from lying on your back to sitting on the side of a flat bed without using bedrails?: A Lot Help needed moving to and from a bed to a chair (including a wheelchair)?: Total Help needed standing up from a chair using your arms (e.g., wheelchair or bedside chair)?: Total Help needed to walk in hospital room?: Total Help needed climbing 3-5 steps with a railing? : Total 6 Click Score: 8    End of Session Equipment Utilized During Treatment: Oxygen Activity Tolerance: Patient limited by fatigue Patient left: with call bell/phone within reach;in bed;Other (comment)(Other PT Vernona Rieger in room for [employer]) Nurse Communication: Mobility status PT Visit Diagnosis: Muscle weakness (generalized) (M62.81)     Time: 4128-7867 PT Time Calculation (min) (ACUTE ONLY): 16 min  Charges:  $Therapeutic Activity: 8-22 mins                     Gailen Venne E, PT Acute Rehabilitation Services Pager [phone]  Office [phone]   Ruven Corradi D Despina Hidden 03/17/2019, 11:48 AM

## 2019-03-17 NOTE — Progress Notes (Signed)
PROGRESS NOTE    Andrew Bruce  VOZ:[id number] DOB: 1965-08-13 DOA: 02/06/2019 PCP: Patient, No Pcp Per   Brief Narrative:  54 year old initially admitted to the hospital for acute hypoxic respiratory failure secondary to COVID-19 pneumonia followed by prolonged hospitalization and complications.  Ended up developing multiple arterial/venous thromboembolic complications including aortic thrombus, CVA, pulmonary embolism and ischemic toes.  Due to pneumothoraces he required bilateral chest tubes eventually underwent tracheostomy placement 2/16 and removal of chest tubes on 2/17.  Currently also has PEG in place.  Overall hemodynamically stable but persistent significant tracheal secretions and leukocytosis.   Assessment & Plan:   Active Problems:   COVID-19   Acute respiratory failure with hypoxia (HCC)   Pneumothorax, right   Cerebral embolism with cerebral infarction   ARDS (adult respiratory distress syndrome) (HCC)   On mechanically assisted ventilation (HCC)   Goals of care, counseling/discussion   Palliative care encounter   Pressure injury of skin  Acute hypoxic respiratory failure secondary to COVID-19 pneumonia progressed to ARDS complicated by bilateral pneumothoraces Tracheostomy in place -Aggressive pulmonary toilet and clearing techniques with chest PT -Frequent deep suctioning by respiratory therapy -Out of bed to chair as tolerated.  Keep head level greater than 30 degree angle -Bronchodilator therapy. -Appreciate input from pulmonary.  Scopolamine and Robinul stopped. -Unable to use PMV due to respiratory secretions  Discussed with nursing staff to somewhat to keep track of how often patient is requiring suctioning.  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  Currently on Eliquis. -Echocardiogram 55-60% with cor pulmonale  Uncontrolled diabetes mellitus type 2, poorly  controlled due to hyperglycemia -Hemoglobin A1c 11.7. -Continue Lantus 30 units twice daily Sliding scale  Hyperlipidemia -Statin  Unstageable ulcer on coccyx, not present on admission Bilateral ear skin ulcer, stage I right ear, stage II left ear -Local wound care -Seen by wound care team.  Appreciate their recommendations.  Dysphagia with PEG tube in place.  Currently getting tube feeds at 65 cc/h.  Previously seen by palliative care team for goals of care discussion-family would like to pursue full code at this time.  PT/OT-will require CIR  DVT prophylaxis: Eliquis Code Status: Full code Family Communication: None Disposition Plan:   Patient From= home  Patient Anticipated D/C place= CIR  Barriers= medically doing better but still having copious amount of secretions.  Once secretions has stabilized, will transition to CIR.  Consultants:   Pulmonary   Antimicrobials:  Remdesivir 1/23>>1/28 Dexamethasone 1/23 >> 2/2  Vanc 2/5 >> 2/12  Zosyn 2/5 >> 2/9 Cefepime 2/9 >> 2/11 Unasyn 2/18 >> 2/23     Subjective: Overall patient does not have any complaints, continues to have copious amount of secretions.   Review of Systems Otherwise negative except as per HPI, including: General: Denies fever, chills, night sweats or unintended weight loss. Resp: Denies cough, wheezing, shortness of breath. Cardiac: Denies chest pain, palpitations, orthopnea, paroxysmal nocturnal dyspnea. GI: Denies abdominal pain, nausea, vomiting, diarrhea or constipation GU: Denies dysuria, frequency, hesitancy or incontinence MS: Denies muscle aches, joint pain or swelling Neuro: Denies headache, neurologic deficits (focal weakness, numbness, tingling), abnormal gait Psych: Denies anxiety, depression, SI/HI/AVH Skin: Denies new rashes or lesions ID: Denies sick contacts, exotic exposures, travel  Examination:  General exam: Appears calm and comfortable  Respiratory system: Diffuse coarse  breath sounds.  Secretions noted coming out of his tracheostomy site. Cardiovascular system: S1 & S2 heard, RRR. No JVD, murmurs, rubs,  gallops or clicks. No pedal edema. Gastrointestinal system: Abdomen is nondistended, soft and nontender. No organomegaly or masses felt. Normal bowel sounds heard. Central nervous system: Alert and oriented. No focal neurological deficits. Extremities: Symmetric 5 x 5 power. Skin: No rashes, lesions or ulcers Psychiatry: Judgement and insight appear normal. Mood & affect appropriate.   Tracheostomy tube in place PEG tube in place  Objective: Vitals:   03/17/19 0436 03/17/19 0451 03/17/19 0724 03/17/19 0728  BP: 125/85     Pulse: 100   98  Resp: (!) 23   18  Temp: (!) 97.4 F (36.3 C)     TempSrc: Oral     SpO2: 98%  98% 98%  Weight:  69.8 kg    Height:        Intake/Output Summary (Last 24 hours) at 03/17/2019 0819 Last data filed at 03/16/2019 2304 Gross per 24 hour  Intake 1390.75 ml  Output 1050 ml  Net 340.75 ml   Filed Weights   03/15/19 0306 03/16/19 0438 03/17/19 0451  Weight: 70.7 kg 70.8 kg 69.8 kg     Data Reviewed:   CBC: Recent Labs  Lab 03/13/19 0534 03/14/19 0636 03/15/19 0300 03/16/19 0437 03/17/19 0513  WBC 15.4* 14.8* 22.2* 21.9* 14.4*  NEUTROABS 12.4* 12.2* 19.1* 19.0* 11.4*  HGB 10.1* 9.5* 9.6* 9.7* 9.7*  HCT 33.9* 32.7* 32.3* 32.6* 33.3*  MCV 89.2 91.3 89.5 89.6 91.5  PLT 616* 541* 484* 511* 414*   Basic Metabolic Panel: Recent Labs  Lab 03/12/19 0618 03/13/19 0534 03/14/19 0636 03/16/19 0437 03/17/19 0513  NA 144 148* 145 147* 144  K 4.0 3.9 4.0 3.8 3.7  CL 110 112* 110 111 108  CO2 25 26 27 28 28   GLUCOSE 254* 140* 253* 207* 213*  BUN 26* 26* 28* 24* 27*  CREATININE 0.66 0.51* 0.44* 0.41* 0.40*  CALCIUM 8.6* 8.5* 8.5* 8.7* 8.5*   GFR: Estimated Creatinine Clearance: 97.5 mL/min (A) (by C-G formula based on SCr of 0.4 mg/dL (L)). Liver Function Tests: No results for input(s): AST, ALT,  ALKPHOS, BILITOT, PROT, ALBUMIN in the last 168 hours. No results for input(s): LIPASE, AMYLASE in the last 168 hours. No results for input(s): AMMONIA in the last 168 hours. Coagulation Profile: No results for input(s): INR, PROTIME in the last 168 hours. Cardiac Enzymes: No results for input(s): CKTOTAL, CKMB, CKMBINDEX, TROPONINI in the last 168 hours. BNP (last 3 results) No results for input(s): PROBNP in the last 8760 hours. HbA1C: No results for input(s): HGBA1C in the last 72 hours. CBG: Recent Labs  Lab 03/16/19 1610 03/16/19 2032 03/16/19 2348 03/17/19 0434 03/17/19 0812  GLUCAP 158* 119* 182* 170* 150*   Lipid Profile: No results for input(s): CHOL, HDL, LDLCALC, TRIG, CHOLHDL, LDLDIRECT in the last 72 hours. Thyroid Function Tests: No results for input(s): TSH, T4TOTAL, FREET4, T3FREE, THYROIDAB in the last 72 hours. Anemia Panel: No results for input(s): VITAMINB12, FOLATE, FERRITIN, TIBC, IRON, RETICCTPCT in the last 72 hours. Sepsis Labs: Recent Labs  Lab 03/13/19 1114 03/14/19 0636 03/15/19 0300  PROCALCITON <0.10 <0.10 <0.10    Recent Results (from the past 240 hour(s))  Culture, blood (routine x 2)     Status: None (Preliminary result)   Collection Time: 03/13/19 11:42 AM   Specimen: BLOOD LEFT WRIST  Result Value Ref Range Status   Specimen Description BLOOD LEFT WRIST  Final   Special Requests   Final    BOTTLES DRAWN AEROBIC ONLY Blood Culture adequate volume  Culture   Final    NO GROWTH 3 DAYS Performed at [facility], [place] [address], [place], [place] [address]    Report Status PENDING  Incomplete  Culture, blood (routine x 2)     Status: None (Preliminary result)   Collection Time: 03/13/19 11:42 AM   Specimen: BLOOD LEFT FOREARM  Result Value Ref Range Status   Specimen Description BLOOD LEFT FOREARM  Final   Special Requests   Final    BOTTLES DRAWN AEROBIC ONLY Blood Culture adequate volume   Culture   Final    NO GROWTH 3  DAYS Performed at [facility], [place] [address], [place], [place] [address]    Report Status PENDING  Incomplete  Culture, respiratory (non-expectorated)     Status: None   Collection Time: 03/13/19  6:23 PM   Specimen: Tracheal Aspirate; Respiratory  Result Value Ref Range Status   Specimen Description TRACHEAL ASPIRATE  Final   Special Requests NONE  Final   Gram Stain   Final    MODERATE GRAM POSITIVE COCCI IN PAIRS AND CHAINS MODERATE GRAM POSITIVE RODS FEW GRAM NEGATIVE RODS FEW WBC PRESENT,BOTH PMN AND MONONUCLEAR RARE SQUAMOUS EPITHELIAL CELLS PRESENT    Culture   Final    Consistent with normal respiratory flora. Performed at [facility], [place] [address] [place], [place] [address]    Report Status 03/15/2019 FINAL  Final         Radiology Studies: No results found.      Scheduled Meds: . apixaban  5 mg Per Tube BID  . vitamin C  500 mg Per Tube Daily  . atorvastatin  20 mg Per Tube q1800  . chlorhexidine gluconate (MEDLINE KIT)  15 mL Mouth Rinse BID  . Chlorhexidine Gluconate Cloth  6 each Topical Daily  . collagenase   Topical Daily  . doxazosin  1 mg Per Tube Daily  . famotidine  20 mg Per Tube BID  . feeding supplement (PRO-STAT SUGAR FREE 64)  30 mL Per Tube BID  . folic acid  1 mg Per Tube Daily  . glycopyrrolate  1 mg Per Tube TID  . insulin aspart  0-20 Units Subcutaneous Q4H  . insulin detemir  30 Units Subcutaneous BID  . ipratropium-albuterol  3 mL Nebulization 4x daily  . mouth rinse  15 mL Mouth Rinse 10 times per day  . multivitamin  15 mL Per Tube Daily  . nutrition supplement (JUVEN)  1 packet Per Tube BID BM  . polyethylene glycol  17 g Per Tube Daily  . scopolamine  1 patch Transdermal Q72H  . senna-docusate  1 tablet Per Tube BID  . sodium chloride flush  10-40 mL Intracatheter Q12H  . thiamine  100 mg Per Tube Daily  . zinc sulfate  220 mg Per Tube Daily   Continuous Infusions: . sodium chloride Stopped (03/12/19  1606)  . feeding supplement (OSMOLITE 1.5 CAL) 1,000 mL (03/16/19 1926)     LOS: 39 days   Time spent= 40 mins    Haeleigh Streiff Arsenio Loader, MD [facility]  If 7PM-7AM, please contact night-coverage  03/17/2019, 8:19 AM

## 2019-03-17 NOTE — Progress Notes (Addendum)
  Speech Language Pathology Treatment: Andrew Bruce Speaking valve  Patient Details Name: Andrew Bruce MRN: [id number] DOB: [birth year] Today's Date: 03/17/2019 Time: 8341-9622 SLP Time Calculation (min) (ACUTE ONLY): 10 min  Assessment / Plan / Recommendation Clinical Impression  Pt appears significant improved today. No coughing, no secretions during session. Though he was ready for a nap and a little sleepy, he participated and followed all commands including repeating words with PMSV in place. Pt broke words up into syllables, one syllable per breath, but still no phonation. He was able to complete 10 reps of EMST Respironics at 20 cm H2O without any difficulty- needs the higher level device! Will f/u with MBS tomorrow for instrumental assessment of swallowing as he appears to be ready for PO intake now that secretions have improved. Pt may now wear PMSV all waking hours.   HPI HPI: [name] without a past medical history admitted 1/23 with 48 hours of worsening shortness of breath in the context of a 10-day Covid illness (dry cough, lost taste/smell). Tested positive for Covid 01/26/19. Decompensated requiring intubation 1/24 and trach'd 2/16. 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 Additional smaller right frontal convexity infarct also noted, bilateral pneumothoraces & aortic clot. Pt has developed gangrene of toes as well.        SLP Plan  MBS       Recommendations         Patient may use Passy-Muir Speech Valve: During all therapies with supervision;During all waking hours (remove during sleep) PMSV Supervision: Intermittent         Follow up Recommendations: 24 hour supervision/assistance;Inpatient Rehab Plan: MBS       GO               Andrew Bruce, [place] CCC-SLP  Acute Rehabilitation Services Pager [phone] Office  [phone]  Andrew Bruce 03/17/2019, 1:28 PM

## 2019-03-17 NOTE — Progress Notes (Signed)
Physical Therapy Wound Evaluation/Treatment Patient Details  Name: Andrew Bruce MRN: [id number] Date of Birth: 1965-10-03  Today's Date: 03/17/2019 Time: 6761-9509 Time Calculation (min): 45 min  Subjective  Subjective: Utilized interpreter throughout session. Cory Roughen, #[id number] Patient and Family Stated Goals: None stated  Pain Score:  Premedicated - tolerated well.   Wound Assessment  Pressure Injury 02/22/19 Coccyx Medial Unstageable - Full thickness tissue loss in which the base of the injury is covered by slough (yellow, tan, gray, green or brown) and/or eschar (tan, brown or black) in the wound bed. quarter-sized/purple  (Active)  Wound Image   03/17/19 1146  Dressing Type Barrier Film (skin prep);Foam - Lift dressing to assess site every shift;Moist to dry 03/17/19 1146  Dressing Changed;Clean;Dry;Intact 03/17/19 1146  Dressing Change Frequency Daily 03/17/19 1146  State of Healing Other (Comment) 03/17/19 1146  Site / Wound Assessment Yellow;Pink 03/17/19 1146  % Wound base Red or Granulating 5% 03/17/19 1146  % Wound base Yellow/Fibrinous Exudate 95% 03/17/19 1146  % Wound base Black/Eschar 0% 03/17/19 1146  Peri-wound Assessment Intact;Pink 03/17/19 1146  Wound Length (cm) 4.5 cm 03/17/19 1146  Wound Width (cm) 2.5 cm 03/17/19 1146  Wound Depth (cm) 0.1 cm 03/17/19 1146  Wound Surface Area (cm^2) 11.25 cm^2 03/17/19 1146  Wound Volume (cm^3) 1.12 cm^3 03/17/19 1146  Tunneling (cm) 0 03/13/19 1843  Undermining (cm) 0 03/13/19 1843  Margins Unattached edges (unapproximated) 03/17/19 1146  Drainage Amount Scant 03/17/19 1146  Drainage Description No odor 03/17/19 1146  Treatment Debridement (Selective);Hydrotherapy (Pulse lavage);Packing (Saline gauze) 03/17/19 1146   Santyl applied to wound bed prior to applying dressing.     Hydrotherapy Pulsed lavage therapy - wound location: Sacrum Pulsed Lavage with Suction (psi): 12 psi Pulsed Lavage with Suction - Normal Saline  Used: 1000 mL Pulsed Lavage Tip: Tip with splash shield Selective Debridement Selective Debridement - Location: Sacrum Selective Debridement - Tools Used: Forceps;Scalpel Selective Debridement - Tissue Removed: Thick, yellow slough   Wound Assessment and Plan  Wound Therapy - Assess/Plan/Recommendations Wound Therapy - Clinical Statement: Pt presents to hydrotherapy with an unstagable pressure injury to the sacrum. We were able to initiate selective debridement this session and pt tolerated well overall. He will benefit from continued hydrotherapy for selective removal of unviable tissue, to decrease bioburden, and promote wound bed healing.  Wound Therapy - Functional Problem List: Global weakness s/p COVID, CVA's, aortic clot Factors Delaying/Impairing Wound Healing: Multiple medical problems;Immobility Hydrotherapy Plan: Debridement;Dressing change;Patient/family education;Pulsatile lavage with suction Wound Therapy - Frequency: 6X / week Wound Therapy - Follow Up Recommendations: Skilled nursing facility Wound Plan: See above  Wound Therapy Goals- Improve the function of patient's integumentary system by progressing the wound(s) through the phases of wound healing (inflammation - proliferation - remodeling) by: Decrease Necrotic Tissue to: 0 Decrease Necrotic Tissue - Progress: Goal set today Increase Granulation Tissue to: 100 Increase Granulation Tissue - Progress: Goal set today Goals/treatment plan/discharge plan were made with and agreed upon by patient/family: Yes Time For Goal Achievement: 7 days Wound Therapy - Potential for Goals: Good  Goals will be updated until maximal potential achieved or discharge criteria met.  Discharge criteria: when goals achieved, discharge from hospital, MD decision/surgical intervention, no progress towards goals, refusal/missing three consecutive treatments without notification or medical reason.  GP     Thelma Comp 03/17/2019, 1:32 PM    Rolinda Roan, PT, DPT Acute Rehabilitation Services Pager: [phone] Office: [phone]

## 2019-03-17 NOTE — Progress Notes (Signed)
Nutrition Follow-up  DOCUMENTATION CODES:   Not applicable  INTERVENTION:  Continue Osmolite 1.5 formula via PEG at goal rate of 65 ml/hr.   Provide 30 ml Prostat TID per tube.   Tube feeding regimen to provide 2640 kcal, 143 grams of protein, and 1186 ml free water.   Continue Juven BID, each packet provides 80 calories, 8 grams of carbohydrate, 2.5  grams of protein (collagen), 7 grams of L-arginine and 7 grams of L-glutamine; supplement contains CaHMB, Vitamins C, E, B12 and Zinc to promote wound healing  NUTRITION DIAGNOSIS:   Inadequate oral intake related to acute illness as evidenced by NPO status; ongoing  GOAL:   Patient will meet greater than or equal to 90% of their needs; met with TF  MONITOR:   Skin, Weight trends, Labs, I & O's, TF tolerance  REASON FOR ASSESSMENT:   Consult, Ventilator Enteral/tube feeding initiation and management  ASSESSMENT:   54 yo male admitted with acute respiratory failure due to COVID-19 pneumonia, ARDS requiring intubation, shock. No PMH  1/23 Admit 1/24 Intubated 1/25 B/L chest tubes placed 2/02 CT head: multiple ischemic infarcts  2/16 S/P tracheostomy and Cortrak placement (tip in the stomach) 2/24 S/P PEG 2/25 TF being resumed  Pt continues on trach collar. Per MD, pt with persistent significant trach secretions and profound deconditioning. Pt has been tolerating his tube feeds well with no difficulties. Noted pt with gradual weight loss. RD to modify tube feeding orders to aid in increased caloric and protein needs to prevent further weight loss and promote healing. RD to continue to monitor for tolerance.   Labs and medications reviewed.   Diet Order:   Diet Order    None      EDUCATION NEEDS:   Not appropriate for education at this time  Skin:  Skin Assessment: Skin Integrity Issues: Skin Integrity Issues:: Unstageable, Stage I, Stage II, Other (Comment) DTI: N/A Stage I: R ear Stage II: L ear Unstageable:  coccyx Other: gangrene of toes  Last BM:  3/2  Height:   Ht Readings from Last 1 Encounters:  02/06/19 _0  (1.676 m)    Weight:   Wt Readings from Last 1 Encounters:  03/17/19 69.8 kg    BMI:  Body mass index is 24.84 kg/m.  Estimated Nutritional Needs:   Kcal:  2637-8588  Protein:  120-145 g  Fluid:  >/= 2 L    Andrew Parker, MS, RD, LDN RD pager number/after hours weekend pager number on Amion.

## 2019-03-17 NOTE — Progress Notes (Signed)
Inpatient Rehab Admissions:  Inpatient Rehab Consult received.  I met with patient at the bedside for rehabilitation assessment and to discuss goals and expectations of an inpatient rehab admission.  I also spoke to his daughter, [name], over the phone.  Family and pt are very interested in CIR program, when pt is medically ready.  They have already begun planning for pt to return home and family to provide 24/7 physical assist after rehab.  I discussed cost of rehab with pt's daughter, and she states the family would be comfortable with this.  Will continue to follow for timing of possible admission, pending improvement in respiratory status (decreased secretions and no more than 35% FiO2) and bed availability.   Signed: Shann Medal, PT, DPT Admissions Coordinator [phone] 03/17/19  3:10 PM

## 2019-03-17 NOTE — Progress Notes (Signed)
NAME:  Andrew Bruce, MRN:  [id number], DOB:  19-Jul-1965, LOS: 62 ADMISSION DATE:  02/06/2019, CONSULTATION DATE:  02/06/19 REFERRING MD:  ER, CHIEF COMPLAINT:  SOB   Brief History   [name] without a past medical history admitted 1/23 with 48 hours of worsening shortness of breath in the context of a 10-day Covid illness (dry cough, lost taste/smell).  Chest x-ray and vitals are consistent with a worsening COVID ARDS.  Admitted for evaluation, developed worsening shortness of breath and increasing O2 needs. Decompensated requiring intubation 1/24.  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   Past Medical History  None known prior to admit   Significant Hospital Events   1/23 Admit  1/24 Decompensated, tx to ICU, intubated 1/25 b/l chest tubes placed 1/25 CVC 2/03 flomax started 2/06 rt chest tube connection changed 2/09 Still no BM. Afebrile.  Left chest tube connection changed 2/10 Palliative care meeting > FULL CODE.     2/16 Trach 2/17 Chest tubes removed 2/18 witnessed aspiration 2/19 Cortrak dislodged / removed 2/21 Woke up /  Mental status improved 2/25 10L/40% ATC, up to chair   Consults:  Neurology  Palliative care  Procedures:  ETT 1/24 >> 2/16 Bilateral chest tube placement 1/25 >> 2/17 R Femoral TLC 1/25 >> 2/4 RUE PICC 2/4 >> Trach 2/16 >>   Significant Diagnostic Tests:  CT Head 2/2 >> no acute intracranial hemorrhage, large area of infarct in the left occipital lobe, likely subacute CTA Chest 2/3 >> LLL segmental PE, no CT evidence of right heart strain, multifocal PNA, background emphysema and probable degree of interstitial fibrosis ECHO 2/3 >> LVEF 55-60%, no WMA, hypokinetic right ventricular free wall, McConnells sign present. RV findings consistent with acute cor pulmonale, small pericardial effusion, mildly elevated pulmonary artery systolic pressure  Micro Data:  COVID 1/12 >>  positive  PCT 1/23 >> Neg Blood cx 2/5 >> negative Sputum 2/6 >> abundant diphtheroids Corynebacterium species  Sputum 2/18 >> normal flora  Antimicrobials:  Remdesivir 1/23>>1/28 Dexamethasone 1/23 >> 2/2  Vanc 2/5 >> 2/12  Zosyn 2/5 >> 2/9 Cefepime 2/9 >> 2/11 Unasyn 2/18 >> 2/23    Interim history/subjective:  No overnight events. Resting comfortably in bed. Getting tube feeds through PEG. Using PMV with supervision only. Still having secretions.   Objective   Blood pressure 130/84, pulse 98, temperature 98.3 F (36.8 C), temperature source Oral, resp. rate (!) 23, height 5\' 6"  (1.676 m), weight 69.8 kg, SpO2 99 %.    FiO2 (%):  [40 %] 40 %   Intake/Output Summary (Last 24 hours) at 03/17/2019 0946 Last data filed at 03/16/2019 2304 Gross per 24 hour  Intake 1240.75 ml  Output 1050 ml  Net 190.75 ml   Filed Weights   03/15/19 0306 03/16/19 0438 03/17/19 0451  Weight: 70.7 kg 70.8 kg 69.8 kg   GEN: middle age man in NAD, no distress HEENT: 6.0 cuffless shiley trach in place, minimal secretions CV: tachycardic, ext warm PULM: scattered rhonci, mild tachypnea GI: PEG site CDI, tube feeds ongoing EXT: no le edema PSYCH: RASS 0  Resp cultures 2/27 - normal respiratory flora.   Resolved Hospital Problem list   Oliguria, hypernatremia Bilateral pneumothoraces Ventilator associated pneumonia  Assessment & Plan:   Acute hypoxemic respiratory failure due to COVID-19 pneumonia, ARDS, PE, left lower lobe pneumonia Tracheostomy Dependence  Profound deconditioning - Cuffless 6-0 in place since 2/25 - Continue TC, pulmonary toileting as ordered: CPT, duonebs,  mucinex - PMV with speech as tolerated.  - stop scopalomine and robinul - this tends to dry up secretions and makes them harder to suction. Discussed with RT, RN and MD for patient.    Durel Salts, MD Pulmonary and Critical Care Medicine [facility] Pager: [phone] Office:[phone]

## 2019-03-18 ENCOUNTER — Inpatient Hospital Stay (HOSPITAL_COMMUNITY): Payer: HRSA Program

## 2019-03-18 LAB — COMPREHENSIVE METABOLIC PANEL
ALT: 93 U/L — ABNORMAL HIGH (ref 0–44)
AST: 42 U/L — ABNORMAL HIGH (ref 15–41)
Albumin: 2.2 g/dL — ABNORMAL LOW (ref 3.5–5.0)
Alkaline Phosphatase: 101 U/L (ref 38–126)
Anion gap: 11 (ref 5–15)
BUN: 24 mg/dL — ABNORMAL HIGH (ref 6–20)
CO2: 28 mmol/L (ref 22–32)
Calcium: 8.7 mg/dL — ABNORMAL LOW (ref 8.9–10.3)
Chloride: 107 mmol/L (ref 98–111)
Creatinine, Ser: 0.37 mg/dL — ABNORMAL LOW (ref 0.61–1.24)
GFR calc Af Amer: >60 mL/min (ref >60)
GFR calc non Af Amer: >60 mL/min (ref >60)
Glucose, Bld: 171 mg/dL — ABNORMAL HIGH (ref 70–99)
Potassium: 3.6 mmol/L (ref 3.5–5.1)
Sodium: 146 mmol/L — ABNORMAL HIGH (ref 135–145)
Total Bilirubin: 0.4 mg/dL (ref 0.3–1.2)
Total Protein: 6.5 g/dL (ref 6.5–8.1)

## 2019-03-18 LAB — GLUCOSE, CAPILLARY
Glucose-Capillary: 124 mg/dL — ABNORMAL HIGH (ref 70–99)
Glucose-Capillary: 131 mg/dL — ABNORMAL HIGH (ref 70–99)
Glucose-Capillary: 135 mg/dL — ABNORMAL HIGH (ref 70–99)
Glucose-Capillary: 152 mg/dL — ABNORMAL HIGH (ref 70–99)
Glucose-Capillary: 177 mg/dL — ABNORMAL HIGH (ref 70–99)
Glucose-Capillary: 179 mg/dL — ABNORMAL HIGH (ref 70–99)
Glucose-Capillary: 187 mg/dL — ABNORMAL HIGH (ref 70–99)

## 2019-03-18 LAB — MAGNESIUM: Magnesium: 2.2 mg/dL (ref 1.7–2.4)

## 2019-03-18 LAB — CULTURE, BLOOD (ROUTINE X 2)
Culture: NO GROWTH
Culture: NO GROWTH
Special Requests: ADEQUATE
Special Requests: ADEQUATE

## 2019-03-18 LAB — BRAIN NATRIURETIC PEPTIDE: B Natriuretic Peptide: 65.4 pg/mL (ref 0.0–100.0)

## 2019-03-18 MED ORDER — POTASSIUM CHLORIDE 20 MEQ/15ML (10%) PO SOLN
40.0000 meq | Freq: Once | ORAL | Status: AC
  Administered 2019-03-18: 40 meq
  Filled 2019-03-18: qty 30

## 2019-03-18 NOTE — Progress Notes (Addendum)
PROGRESS NOTE    Andrew Bruce  WHQ:[id number] DOB: [birth year] DOA: 02/06/2019 PCP: Patient, No Pcp Per   Brief Narrative:  54 year old initially admitted to the hospital for acute hypoxic respiratory failure secondary to COVID-19 pneumonia followed by prolonged hospitalization and complications.  Ended up developing multiple arterial/venous thromboembolic complications including aortic thrombus, CVA, pulmonary embolism and ischemic toes.  Due to pneumothoraces he required bilateral chest tubes eventually underwent tracheostomy placement 2/16 and removal of chest tubes on 2/17.  Currently also has PEG in place.  Overall hemodynamically stable but persistent significant tracheal secretions and leukocytosis.   Assessment & Plan:   Active Problems:   COVID-19   Acute respiratory failure with hypoxia (HCC)   Pneumothorax, right   Cerebral embolism with cerebral infarction   ARDS (adult respiratory distress syndrome) (HCC)   On mechanically assisted ventilation (HCC)   Goals of care, counseling/discussion   Palliative care encounter   Pressure injury of skin  Acute hypoxic respiratory failure secondary to COVID-19 pneumonia progressed to ARDS complicated by bilateral pneumothoraces Tracheostomy in place -Aggressive pulmonary toilet and clearing techniques. -Frequent deep suctioning by respiratory therapy -Out of bed to chair as tolerated.  Keep head level greater than 30 degree angle -Bronchodilator therapy. -Appreciate input from pulmonary.  Scopolamine and Robinul stopped. -Unable to use PMV due to respiratory secretions -For now requiring suctioning at least every 2 hours.  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 -Continue  Lantus 30 units twice daily Sliding scale  Hyperlipidemia -Statin  Unstageable ulcer on coccyx, not present on admission Bilateral ear skin ulcer, stage I right ear, stage II left ear -Local wound care -Seen by wound care team.  Appreciate their recommendations.  Dysphagia with PEG tube in place.  Continue tube feeds at 65 cc/h  Previously seen by palliative care team for goals of care discussion-family would like to pursue full code at this time.  PT/OT-plans for CIR  DVT prophylaxis: Eliquis Code Status: Full code Family Communication: None Disposition Plan:   Patient From= home  Patient Anticipated D/C place= CIR  Barriers= once is secretions have stabilized and O2 requirements have remained stable, he will go to CIR.  Consultants:   Pulmonary   Antimicrobials:  Remdesivir 1/23>>1/28 Dexamethasone 1/23 >> 2/2  Vanc 2/5 >> 2/12  Zosyn 2/5 >> 2/9 Cefepime 2/9 >> 2/11 Unasyn 2/18 >> 2/23     Subjective: Still having quite a bit of secretions from his tracheostomy site requiring frequent suctioning.  Otherwise he is doing well.  Review of Systems Otherwise negative except as per HPI, including: General = no fevers, chills, dizziness,  fatigue HEENT/EYES = negative for loss of vision, double vision, blurred vision,  sore throa Cardiovascular= negative for chest pain, palpitation Respiratory/lungs= negative for shortness of breath, cough, wheezing; hemoptysis,  Gastrointestinal= negative for nausea, vomiting, abdominal pain Genitourinary= negative for Dysuria MSK = Negative for arthralgia, myalgias Neurology= Negative for headache, numbness, tingling  Psychiatry= Negative for suicidal and homocidal ideation Skin= Negative for Rash   Examination: Constitutional: Not in acute distress, chronically appearing ill Respiratory: Bilateral diffuse coarse breath sounds thick secretions from his tracheostomy site. Cardiovascular: Normal sinus rhythm, no rubs Abdomen:  Nontender nondistended good bowel sounds Musculoskeletal: No edema noted Skin: No rashes seen Neurologic: CN 2-12 grossly intact.  And nonfocal Psychiatric: Normal judgment  and insight. Alert and oriented x 3. Normal mood.   Tracheostomy tube in place PEG tube in place  Objective: Vitals:   03/18/19 0357 03/18/19 0733 03/18/19 0742 03/18/19 0810  BP: 128/82   132/87  Pulse: 95  (!) 101   Resp: 20  20   Temp: 98.5 F (36.9 C)     TempSrc: Oral     SpO2: 97% 98% 98%   Weight:      Height:        Intake/Output Summary (Last 24 hours) at 03/18/2019 1050 Last data filed at 03/18/2019 0834 Gross per 24 hour  Intake --  Output 1900 ml  Net -1900 ml   Filed Weights   03/15/19 0306 03/16/19 0438 03/17/19 0451  Weight: 70.7 kg 70.8 kg 69.8 kg     Data Reviewed:   CBC: Recent Labs  Lab 03/13/19 0534 03/14/19 0636 03/15/19 0300 03/16/19 0437 03/17/19 0513  WBC 15.4* 14.8* 22.2* 21.9* 14.4*  NEUTROABS 12.4* 12.2* 19.1* 19.0* 11.4*  HGB 10.1* 9.5* 9.6* 9.7* 9.7*  HCT 33.9* 32.7* 32.3* 32.6* 33.3*  MCV 89.2 91.3 89.5 89.6 91.5  PLT 616* 541* 484* 511* 768*   Basic Metabolic Panel: Recent Labs  Lab 03/13/19 0534 03/14/19 0636 03/16/19 0437 03/17/19 0513 03/18/19 0427  NA 148* 145 147* 144 146*  K 3.9 4.0 3.8 3.7 3.6  CL 112* 110 111 108 107  CO2 _0 GLUCOSE 140* 253* 207* 213* 171*  BUN 26* 28* 24* 27* 24*  CREATININE 0.51* 0.44* 0.41* 0.40* 0.37*  CALCIUM 8.5* 8.5* 8.7* 8.5* 8.7*  MG  --   --   --   --  2.2   GFR: Estimated Creatinine Clearance: 97.5 mL/min (A) (by C-G formula based on SCr of 0.37 mg/dL (L)). Liver Function Tests: Recent Labs  Lab 03/18/19 0427  AST 42*  ALT 93*  ALKPHOS 101  BILITOT 0.4  PROT 6.5  ALBUMIN 2.2*   No results for input(s): LIPASE, AMYLASE in the last 168 hours. No results for input(s): AMMONIA in the last 168 hours. Coagulation Profile: No results for input(s): INR, PROTIME in the last 168 hours. Cardiac  Enzymes: No results for input(s): CKTOTAL, CKMB, CKMBINDEX, TROPONINI in the last 168 hours. BNP (last 3 results) No results for input(s): PROBNP in the last 8760 hours. HbA1C: No results for input(s): HGBA1C in the last 72 hours. CBG: Recent Labs  Lab 03/17/19 1653 03/17/19 1929 03/18/19 0005 03/18/19 0343 03/18/19 0742  GLUCAP 135* 136* 124* 135* 131*   Lipid Profile: No results for input(s): CHOL, HDL, LDLCALC, TRIG, CHOLHDL, LDLDIRECT in the last 72 hours. Thyroid Function Tests: No results for input(s): TSH, T4TOTAL, FREET4, T3FREE, THYROIDAB in the last 72 hours. Anemia Panel: No results for input(s): VITAMINB12, FOLATE, FERRITIN, TIBC, IRON, RETICCTPCT in the last 72 hours. Sepsis Labs: Recent Labs  Lab 03/13/19 1114 03/14/19 0636 03/15/19 0300  PROCALCITON <0.10 <0.10 <0.10    Recent Results (from the past 240 hour(s))  Culture, blood (routine x 2)     Status: None   Collection Time: 03/13/19 11:42 AM   Specimen: BLOOD LEFT WRIST  Result Value Ref Range Status   Specimen Description BLOOD LEFT WRIST  Final   Special Requests   Final    BOTTLES DRAWN AEROBIC ONLY Blood Culture adequate volume   Culture   Final    NO GROWTH 5 DAYS Performed at [facility], [address] [place], [place] [address]  Report Status 03/18/2019 FINAL  Final  Culture, blood (routine x 2)     Status: None   Collection Time: 03/13/19 11:42 AM   Specimen: BLOOD LEFT FOREARM  Result Value Ref Range Status   Specimen Description BLOOD LEFT FOREARM  Final   Special Requests   Final    BOTTLES DRAWN AEROBIC ONLY Blood Culture adequate volume   Culture   Final    NO GROWTH 5 DAYS Performed at [facility], [place] [address] [place], [place] [address]    Report Status 03/18/2019 FINAL  Final  Culture, respiratory (non-expectorated)     Status: None   Collection Time: 03/13/19  6:23 PM   Specimen: Tracheal Aspirate; Respiratory  Result Value Ref Range Status   Specimen  Description TRACHEAL ASPIRATE  Final   Special Requests NONE  Final   Gram Stain   Final    MODERATE GRAM POSITIVE COCCI IN PAIRS AND CHAINS MODERATE GRAM POSITIVE RODS FEW GRAM NEGATIVE RODS FEW WBC PRESENT,BOTH PMN AND MONONUCLEAR RARE SQUAMOUS EPITHELIAL CELLS PRESENT    Culture   Final    Consistent with normal respiratory flora. Performed at [facility], [place] [address] [place],  [address]    Report Status 03/15/2019 FINAL  Final         Radiology Studies: No results found.      Scheduled Meds: . apixaban  5 mg Per Tube BID  . vitamin C  500 mg Per Tube Daily  . atorvastatin  20 mg Per Tube q1800  . chlorhexidine gluconate (MEDLINE KIT)  15 mL Mouth Rinse BID  . Chlorhexidine Gluconate Cloth  6 each Topical Daily  . collagenase   Topical Daily  . doxazosin  1 mg Per Tube Daily  . famotidine  20 mg Per Tube BID  . feeding supplement (PRO-STAT SUGAR FREE 64)  30 mL Per Tube TID  . folic acid  1 mg Per Tube Daily  . insulin aspart  0-20 Units Subcutaneous Q4H  . insulin detemir  30 Units Subcutaneous BID  . ipratropium-albuterol  3 mL Nebulization 4x daily  . mouth rinse  15 mL Mouth Rinse 10 times per day  . multivitamin  15 mL Per Tube Daily  . nutrition supplement (JUVEN)  1 packet Per Tube BID BM  . polyethylene glycol  17 g Per Tube Daily  . senna-docusate  1 tablet Per Tube BID  . sodium chloride flush  10-40 mL Intracatheter Q12H  . thiamine  100 mg Per Tube Daily  . zinc sulfate  220 mg Per Tube Daily   Continuous Infusions: . sodium chloride Stopped (03/12/19 1606)  . feeding supplement (OSMOLITE 1.5 CAL) 1,000 mL (03/16/19 1926)     LOS: 40 days   Time spent= 35 mins    Alania Overholt Arsenio Loader, MD [facility]  If 7PM-7AM, please contact night-coverage  03/18/2019, 10:50 AM

## 2019-03-18 NOTE — Progress Notes (Signed)
Physical Therapy Wound Treatment Patient Details  Name: Andrew Bruce MRN: [id number] Date of Birth: [birth year]  Today's Date: 03/18/2019 Time: 1010-1115 Time Calculation (min): 65 min  Subjective  Subjective: Utilized interpreter throughout session.  Patient and Family Stated Goals: None stated  Pain Score:  Premedicated and tolerated well  Wound Assessment  Pressure Injury 02/22/19 Coccyx Medial Unstageable - Full thickness tissue loss in which the base of the injury is covered by slough (yellow, tan, gray, green or brown) and/or eschar (tan, brown or black) in the wound bed. quarter-sized/purple  (Active)  Dressing Type Barrier Film (skin prep);Foam - Lift dressing to assess site every shift;Moist to dry 03/18/19 1418  Dressing Changed;Clean;Dry;Intact 03/18/19 1418  Dressing Change Frequency Daily 03/18/19 1418  State of Healing Other (Comment) 03/18/19 1418  Site / Wound Assessment Yellow;Pink 03/18/19 1418  % Wound base Red or Granulating 15% 03/18/19 1418  % Wound base Yellow/Fibrinous Exudate 85% 03/18/19 1418  % Wound base Black/Eschar 0% 03/18/19 1418  Peri-wound Assessment Intact;Pink 03/18/19 1418  Wound Length (cm) 4.5 cm 03/17/19 1146  Wound Width (cm) 2.5 cm 03/17/19 1146  Wound Depth (cm) 0.1 cm 03/17/19 1146  Wound Surface Area (cm^2) 11.25 cm^2 03/17/19 1146  Wound Volume (cm^3) 1.12 cm^3 03/17/19 1146  Tunneling (cm) 0 03/13/19 1843  Undermining (cm) 0 03/13/19 1843  Margins Unattached edges (unapproximated) 03/18/19 1418  Drainage Amount Minimal 03/18/19 1418  Drainage Description No odor 03/18/19 1418  Treatment Debridement (Selective);Hydrotherapy (Pulse lavage);Packing (Saline gauze) 03/18/19 1418   Santyl applied to wound bed prior to applying dressing.     Hydrotherapy Pulsed lavage therapy - wound location: Sacrum Pulsed Lavage with Suction (psi): 12 psi Pulsed Lavage with Suction - Normal Saline Used: 1000 mL Pulsed Lavage Tip: Tip with splash  shield Selective Debridement Selective Debridement - Location: Sacrum Selective Debridement - Tools Used: Forceps;Scalpel Selective Debridement - Tissue Removed: Thick, yellow slough   Wound Assessment and Plan  Wound Therapy - Assess/Plan/Recommendations Wound Therapy - Clinical Statement: Progressing with debridement and noted an increase in healthier looking tissue this session. He will benefit from continued hydrotherapy for selective removal of unviable tissue, to decrease bioburden, and promote wound bed healing.  Wound Therapy - Functional Problem List: Global weakness s/p COVID, CVA's, aortic clot Factors Delaying/Impairing Wound Healing: Multiple medical problems;Immobility Hydrotherapy Plan: Debridement;Dressing change;Patient/family education;Pulsatile lavage with suction Wound Therapy - Frequency: 6X / week Wound Therapy - Follow Up Recommendations: Skilled nursing facility Wound Plan: See above  Wound Therapy Goals- Improve the function of patient's integumentary system by progressing the wound(s) through the phases of wound healing (inflammation - proliferation - remodeling) by: Decrease Necrotic Tissue to: 0 Decrease Necrotic Tissue - Progress: Progressing toward goal Increase Granulation Tissue to: 100 Increase Granulation Tissue - Progress: Progressing toward goal Goals/treatment plan/discharge plan were made with and agreed upon by patient/family: Yes Time For Goal Achievement: 7 days Wound Therapy - Potential for Goals: Good  Goals will be updated until maximal potential achieved or discharge criteria met.  Discharge criteria: when goals achieved, discharge from hospital, MD decision/surgical intervention, no progress towards goals, refusal/missing three consecutive treatments without notification or medical reason.  GP     Thelma Comp 03/18/2019, 2:22 PM   Rolinda Roan, PT, DPT Acute Rehabilitation Services Pager: [phone] Office: [phone]

## 2019-03-18 NOTE — Progress Notes (Signed)
RT NOTE: RT holding CPT at this time. Patient not in room and unavailable at this time. RT will continue to monitor.

## 2019-03-18 NOTE — Progress Notes (Signed)
03/18/19 1200  SLP Visit Information  SLP Received On 03/18/19  General Information  HPI [name] without a past medical history admitted 1/23 with 48 hours of worsening shortness of breath in the context of a 10-day Covid illness (dry cough, lost taste/smell). Tested positive for Covid 01/26/19. Decompensated requiring intubation 1/24 and trach'd 2/16. 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 Additional smaller right frontal convexity infarct also noted, bilateral pneumothoraces & aortic clot. Pt has developed gangrene of toes as well.    Type of Study MBS-Modified Barium Swallow Study  Diet Prior to this Study NPO  Temperature Spikes Noted No  Respiratory Status Trach Collar  History of Recent Intubation Yes  Length of Intubations (days) 22 days  Date extubated 03/02/19  Behavior/Cognition Alert;Cooperative  Oral Care Completed by SLP Yes  Oral Cavity - Dentition Adequate natural dentition  Vision Functional for self feeding  Self-Feeding Abilities Able to feed self  Patient Positioning Upright in chair  Baseline Vocal Quality Aphonic  Volitional Cough Weak  Volitional Swallow Able to elicit  Oral Preparation/Oral Phase  Oral Phase WFL  Pharyngeal Phase  Pharyngeal Phase Impaired  Pharyngeal - Nectar  Pharyngeal- Nectar Teaspoon Penetration/Apiration after swallow;Pharyngeal residue - pyriform;Reduced epiglottic inversion;Reduced tongue base retraction;Reduced anterior laryngeal mobility  Pharyngeal- Nectar Straw Penetration/Apiration after swallow;Pharyngeal residue - pyriform;Reduced epiglottic inversion;Reduced tongue base retraction;Reduced anterior laryngeal mobility;Penetration/Aspiration before swallow;Significant aspiration (Amount);Nasopharyngeal reflux  Pharyngeal Material enters airway, passes BELOW cords and not ejected out despite cough attempt  by patient  Pharyngeal Material enters airway, passes BELOW cords and not ejected out despite cough attempt by patient  Pharyngeal - Thin  Pharyngeal- Thin Teaspoon Penetration/Apiration after swallow;Pharyngeal residue - pyriform;Reduced epiglottic inversion;Reduced tongue base retraction;Reduced anterior laryngeal mobility  Pharyngeal Material enters airway, passes BELOW cords and not ejected out despite cough attempt by patient  Pharyngeal - Solids  Pharyngeal- Puree Penetration/Apiration after swallow;Pharyngeal residue - pyriform;Reduced epiglottic inversion;Reduced tongue base retraction;Reduced anterior laryngeal mobility  Clinical Impression  Clinical Impression Pt demonstrates severe oropharyngeal and cervical esophageal dysphagia. Pt is able to initiate a swallow and has relatively good laryngeal elevation. Other motor movements for effective swallowing moderately reduced in ROM and strength including base of tongue retraction, hyoid excursion epiglottic deflection and pharyngeal stripping wave. Though pt does improve with an effortful swallow, he has limitied bolus passage through the cervical esophgus with worsening backflow to the pyriforms and even episodes of propulsion of bolus to the nasopharynx. Appears to also be unable to swallow secretions that also pool there. Pt aspirated before the swallow with large sips, and after the swallow due to severe residue. Cough triggered, but completely ineffective. Pt additionally unable to phonate during cough or speech. No positional strategies very helpful. Concern for decreased UES innervation, and potentially unilaterally decreased laryngeal adduction. Appearance of impairment not consistent with deconditioning or prolonged intubation. Discussed with MD. Further diagnostic treatment needed (potentially FEES) needed for best treatment plan.   SLP Visit Diagnosis Dysphagia, pharyngoesophageal phase (R13.14)  Impact on safety and function Severe  aspiration risk  Swallow Evaluation Recommendations  SLP Diet Recommendations NPO;Alternative means - long-term  Medication Administration Crushed with puree  Treatment Plan  Follow up Recommendations 24 hour supervision/assistance;Inpatient Rehab  Progression Toward Goals  Progression toward goals Progressing toward goals  SLP Time Calculation  SLP Start Time (ACUTE ONLY) 1145  SLP Stop Time (ACUTE ONLY) 1210  SLP Time Calculation (  min) (ACUTE ONLY) 25 min  SLP Evaluations  $ SLP Speech Visit 1 Visit  SLP Evaluations  $MBS Swallow 1 Procedure  $Speech Treatment for Individual 1 Procedure

## 2019-03-19 ENCOUNTER — Inpatient Hospital Stay (HOSPITAL_COMMUNITY): Payer: HRSA Program

## 2019-03-19 DIAGNOSIS — Z930 Tracheostomy status: Secondary | ICD-10-CM

## 2019-03-19 LAB — GLUCOSE, CAPILLARY
Glucose-Capillary: 165 mg/dL — ABNORMAL HIGH (ref 70–99)
Glucose-Capillary: 74 mg/dL (ref 70–99)
Glucose-Capillary: 124 mg/dL — ABNORMAL HIGH (ref 70–99)
Glucose-Capillary: 106 mg/dL — ABNORMAL HIGH (ref 70–99)
Glucose-Capillary: 170 mg/dL — ABNORMAL HIGH (ref 70–99)

## 2019-03-19 LAB — COMPREHENSIVE METABOLIC PANEL
ALT: 100 U/L — ABNORMAL HIGH (ref 0–44)
AST: 43 U/L — ABNORMAL HIGH (ref 15–41)
Albumin: 2.3 g/dL — ABNORMAL LOW (ref 3.5–5.0)
Alkaline Phosphatase: 103 U/L (ref 38–126)
Anion gap: 9 (ref 5–15)
BUN: 24 mg/dL — ABNORMAL HIGH (ref 6–20)
CO2: 28 mmol/L (ref 22–32)
Calcium: 8.7 mg/dL — ABNORMAL LOW (ref 8.9–10.3)
Chloride: 106 mmol/L (ref 98–111)
Creatinine, Ser: 0.36 mg/dL — ABNORMAL LOW (ref 0.61–1.24)
GFR calc Af Amer: >60 mL/min (ref >60)
GFR calc non Af Amer: >60 mL/min (ref >60)
Glucose, Bld: 100 mg/dL — ABNORMAL HIGH (ref 70–99)
Potassium: 3.6 mmol/L (ref 3.5–5.1)
Sodium: 143 mmol/L (ref 135–145)
Total Bilirubin: 0.5 mg/dL (ref 0.3–1.2)
Total Protein: 6.9 g/dL (ref 6.5–8.1)

## 2019-03-19 LAB — MAGNESIUM: Magnesium: 2.2 mg/dL (ref 1.7–2.4)

## 2019-03-19 NOTE — Progress Notes (Signed)
NAME:  Andrew Bruce, MRN:  [id number], DOB:  17-Jan-1965, LOS: 41 ADMISSION DATE:  02/06/2019, CONSULTATION DATE:  02/06/19 REFERRING MD:  ER, CHIEF COMPLAINT:  SOB   Brief History   [name] without a past medical history admitted 1/23 with 48 hours of worsening shortness of breath in the context of a 10-day Covid illness (dry cough, lost taste/smell).  Chest x-ray and vitals are consistent with a worsening COVID ARDS.  Admitted for evaluation, developed worsening shortness of breath and increasing O2 needs. Decompensated requiring intubation 1/24.  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   Past Medical History  None known prior to admit   Significant Hospital Events   1/23 Admit  1/24 Decompensated, tx to ICU, intubated 1/25 b/l chest tubes placed 1/25 CVC 2/03 flomax started 2/06 rt chest tube connection changed 2/09 Still no BM. Afebrile.  Left chest tube connection changed 2/10 Palliative care meeting > FULL CODE.     2/16 Trach 2/17 Chest tubes removed 2/18 witnessed aspiration 2/19 Cortrak dislodged / removed 2/21 Woke up /  Mental status improved 2/25 10L/40% ATC, up to chair   Consults:  Neurology  Palliative care  Procedures:  ETT 1/24 >> 2/16 Bilateral chest tube placement 1/25 >> 2/17 R Femoral TLC 1/25 >> 2/4 RUE PICC 2/4 >> Trach 2/16 >>   Significant Diagnostic Tests:  CT Head 2/2 >> no acute intracranial hemorrhage, large area of infarct in the left occipital lobe, likely subacute CTA Chest 2/3 >> LLL segmental PE, no CT evidence of right heart strain, multifocal PNA, background emphysema and probable degree of interstitial fibrosis ECHO 2/3 >> LVEF 55-60%, no WMA, hypokinetic right ventricular free wall, McConnells sign present. RV findings consistent with acute cor pulmonale, small pericardial effusion, mildly elevated pulmonary artery systolic pressure  Micro Data:  COVID 1/12 >>  positive  PCT 1/23 >> Neg Blood cx 2/5 >> negative Sputum 2/6 >> abundant diphtheroids Corynebacterium species  Sputum 2/18 >> normal flora  Antimicrobials:  Remdesivir 1/23>>1/28 Dexamethasone 1/23 >> 2/2  Vanc 2/5 >> 2/12  Zosyn 2/5 >> 2/9 Cefepime 2/9 >> 2/11 Unasyn 2/18 >> 2/23    Interim history/subjective:   Secretions appear improved Afebrile   Objective   Blood pressure 117/88, pulse 100, temperature 98.5 F (36.9 C), temperature source Oral, resp. rate (!) 21, height 5\' 6"  (1.676 m), weight 69.8 kg, SpO2 99 %.    FiO2 (%):  [35 %] 35 %   Intake/Output Summary (Last 24 hours) at 03/19/2019 1721 Last data filed at 03/19/2019 0720 Gross per 24 hour  Intake 10 ml  Output 900 ml  Net -890 ml   Filed Weights   03/15/19 0306 03/16/19 0438 03/17/19 0451  Weight: 70.7 kg 70.8 kg 69.8 kg   GEN: middle age man in NAD, no distress HEENT: 6.0 cuffless shiley trach in place, minimal secretions CV: S1-S2 regular, ext warm PULM: scattered rhonci, mild tachypnea GI: PEG site CDI, tube feeds ongoing EXT: no le edema, gangrenous toes  Labs show normal electrolytes  Resolved Hospital Problem list   Oliguria, hypernatremia Bilateral pneumothoraces Ventilator associated pneumonia  Assessment & Plan:   Acute hypoxemic respiratory failure due to COVID-19 pneumonia, ARDS, PE, left lower lobe pneumonia Tracheostomy Dependence  Profound deconditioning - Cuffless 6-0 in place since 2/25 - Continue TC, pulmonary toileting as ordered: CPT, duonebs, mucinex -Tolerating PMV valve -We will consider downsizing next week  MRI reviewed,  as also input from other  services Undergoing hydrotherapy for sacral wound Plan is to transfer to CIR once suctioning requirements decrease PCCM will see again Monday  Kara Mead MD. [facility]. [place] [facility]  If no response to pager , please call [phone]   03/19/2019

## 2019-03-19 NOTE — Progress Notes (Signed)
Occupational Therapy Treatment Patient Details Name: Andrew Bruce MRN: [id number] DOB: October 10, 1965 Today's Date: 03/19/2019    History of present illness Pt is a 54 y.o. Spanish-speaking male admitted 02/06/19 with worsening SOB with a 10-day h/o COVID-19 ilness. CXR consistent with COVID ARDS. Worsening respiratory status with decompensation requiring intubation 1/24. 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 S/p trach placement 2/16. Liberated from vent to trach collar 2/25. S/p PEG tube. Pt has also developed gangrene of bilateral toes. No significant PMH.   OT comments  Patient supine in bed on arrival following speech therapy visit.  Interpretor used throughout session.  Patient required mod x2 to roll in bed and max x2 to sit and maintain balance at EOB.  Stayed seated for 7 min.  Encouraged putting weight through R UE and using to help support while seated but he required [facility] to stay in this position.  Patient on 8L O2 via trach collar and 92 SpO2 at rest, 85 desat when sitting.  Patient very fatigued after 7 min and required laying back in bed.  Changed bed linens and gown as bed had tube feed in it, RN notified. Will continue to follow with OT acutely to address the deficits listed below.     Follow Up Recommendations  CIR;Supervision/Assistance - 24 hour    Equipment Recommendations  Other (comment)    Recommendations for Other Services      Precautions / Restrictions Precautions Precautions: Fall;Other (comment) Precaution Comments: 8LO2 via trach collar (40% FiO2), PEG; orthostatic hypotension Restrictions Weight Bearing Restrictions: No       Mobility Bed Mobility Overal bed mobility: Needs Assistance Bed Mobility: Rolling;Supine to Sit;Sit to Sidelying Rolling: Mod assist;+2 for physical assistance   Supine to sit: Max assist;+2 for physical assistance Sit to supine: Max assist;+2 for physical assistance Sit to sidelying: Max  assist;+2 for physical assistance;+2 for safety/equipment General bed mobility comments: cues for sequencing, assist with BLE off bed and to elevate trunk, use of bed pad to scoot to EOB  Transfers Overall transfer level: Needs assistance                    Balance Overall balance assessment: Needs assistance Sitting-balance support: Feet supported;Single extremity supported Sitting balance-Leahy Scale: Poor Sitting balance - Comments: max assist to maintain balance. Pt sat EOB x 7 minutes. Postural control: Right lateral lean;Posterior lean                                 ADL either performed or assessed with clinical judgement   ADL Overall ADL's : Needs assistance/impaired Eating/Feeding: Maximal assistance;Bed level(Ice chip )               Upper Body Dressing : Maximal assistance;Bed level                   Functional mobility during ADLs: Maximal assistance;+2 for physical assistance;+2 for safety/equipment       Vision       Perception     Praxis      Cognition Arousal/Alertness: Awake/alert Behavior During Therapy: Flat affect Overall Cognitive Status: Difficult to assess                         Following Commands: Follows one step commands consistently  Exercises General Exercises - Lower Extremity Long Arc Quad: AROM;Right;Left;10 reps;Seated Hip Flexion/Marching: AROM;Right;Left;5 reps;Seated   Shoulder Instructions       General Comments stratus interpreter- [phone]. Pt with PMV and 8L O2 via TC for entire session. Desat to 85% sitting EOB requiring return to supine and trach suctioning by RN. SpO2 92% at rest in bed at end of session.    Pertinent Vitals/ Pain       Pain Assessment: No/denies pain  Home Living                                          Prior Functioning/Environment              Frequency  Min 2X/week        Progress Toward  Goals  OT Goals(current goals can now be found in the care plan section)  Progress towards OT goals: Progressing toward goals  Acute Rehab OT Goals Patient Stated Goal: not stated, motivated to participate OT Goal Formulation: With patient Time For Goal Achievement: 03/27/19 Potential to Achieve Goals: Good  Plan Discharge plan remains appropriate    Co-evaluation    PT/OT/SLP Co-Evaluation/Treatment: Yes Reason for Co-Treatment: Complexity of the patient's impairments (multi-system involvement);For patient/therapist safety;To address functional/ADL transfers PT goals addressed during session: Mobility/safety with mobility;Balance OT goals addressed during session: ADL's and self-care      AM-PAC OT "6 Clicks" Daily Activity     Outcome Measure   Help from another person eating meals?: Total Help from another person taking care of personal grooming?: A Lot Help from another person toileting, which includes using toliet, bedpan, or urinal?: Total Help from another person bathing (including washing, rinsing, drying)?: A Lot Help from another person to put on and taking off regular upper body clothing?: Total Help from another person to put on and taking off regular lower body clothing?: Total 6 Click Score: 8    End of Session Equipment Utilized During Treatment: Oxygen  OT Visit Diagnosis: Hemiplegia and hemiparesis;Other symptoms and signs involving cognitive function;Other abnormalities of gait and mobility (R26.89);Other symptoms and signs involving the nervous system (R29.898) Hemiplegia - Right/Left: Right   Activity Tolerance Patient tolerated treatment well   Patient Left in bed;with bed alarm set;with call bell/phone within reach   Nurse Communication Mobility status        Time: 6237-6283 OT Time Calculation (min): 38 min  Charges: OT General Charges $OT Visit: 1 Visit OT Treatments $Self Care/Home Management : 8-22 mins  August Luz,  OTR/L    Phylliss Bob 03/19/2019, 12:20 PM

## 2019-03-19 NOTE — Progress Notes (Signed)
Physical Therapy Treatment Patient Details Name: Andrew Bruce MRN: [id number] DOB: 1965/06/09 Today's Date: 03/19/2019    History of Present Illness Pt is a 54 y.o. Spanish-speaking male admitted 02/06/19 with worsening SOB with a 10-day h/o COVID-19 ilness. CXR consistent with COVID ARDS. Worsening respiratory status with decompensation requiring intubation 1/24. 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 S/p trach placement 2/16. Liberated from vent to trach collar 2/25. S/p PEG tube. Pt has also developed gangrene of bilateral toes. No significant PMH.    PT Comments    Pt received in bed immediately following ST session. Pt required +2 mod assist rolling, +2 max assist supine <> sit and max assist to maintain sitting balance EOB. Pt tolerated sitting x 7 minutes. Able to perform LE exercises in sit. Pt on 8L O2 via TC and PMV in place during session. Desat to 85% sitting EOB requiring return to supine and trach suctioning by RN. Pt's bed linens saturated with tube feed. Rolling performed R/L for bed linen change. SpO2 92% at rest at end of session.  Unable to progress with transfers OOB this session due to fatigue and scheduled procedures. Pt to undergo hydrotherapy today for sacral wound and is also scheduled for MRI.   Follow Up Recommendations  CIR;Supervision/Assistance - 24 hour     Equipment Recommendations  Other (comment)(defer to next venue)    Recommendations for Other Services       Precautions / Restrictions Precautions Precautions: Fall;Other (comment) Precaution Comments: 8LO2 via trach collar (40% FiO2), PEG; orthostatic hypotension    Mobility  Bed Mobility Overal bed mobility: Needs Assistance Bed Mobility: Rolling Rolling: Mod assist;+2 for physical assistance   Supine to sit: Max assist;+2 for physical assistance Sit to supine: Max assist;+2 for physical assistance   General bed mobility comments: cues for sequencing,  assist with BLE off bed and to elevate trunk, use of bed pad to scoot to EOB  Transfers                    Ambulation/Gait                 Stairs             Wheelchair Mobility    Modified Rankin (Stroke Patients Only)       Balance Overall balance assessment: Needs assistance Sitting-balance support: Feet supported;Single extremity supported Sitting balance-Leahy Scale: Poor Sitting balance - Comments: max assist to maintain balance. Pt sat EOB x 7 minutes. Postural control: Right lateral lean;Posterior lean                                  Cognition Arousal/Alertness: Awake/alert Behavior During Therapy: Flat affect Overall Cognitive Status: Difficult to assess                         Following Commands: Follows one step commands consistently              Exercises General Exercises - Lower Extremity Long Arc Quad: AROM;Right;Left;10 reps;Seated Hip Flexion/Marching: AROM;Right;Left;5 reps;Seated    General Comments General comments (skin integrity, edema, etc.): stratus interpreter- Susana [phone]. Pt with PMV and 8L O2 via TC for entire session. Desat to 85% sitting EOB requiring return to supine and trach suctioning by RN. SpO2 92% at rest in bed at end of session.      Pertinent Vitals/Pain  Pain Assessment: No/denies pain    Home Living                      Prior Function            PT Goals (current goals can now be found in the care plan section) Acute Rehab PT Goals Patient Stated Goal: not stated, motivated to participate Progress towards PT goals: Progressing toward goals    Frequency    Min 3X/week      PT Plan Current plan remains appropriate    Co-evaluation PT/OT/SLP Co-Evaluation/Treatment: Yes Reason for Co-Treatment: Complexity of the patient's impairments (multi-system involvement);For patient/therapist safety;To address functional/ADL transfers PT goals addressed during  session: Mobility/safety with mobility;Balance        AM-PAC PT "6 Clicks" Mobility   Outcome Measure  Help needed turning from your back to your side while in a flat bed without using bedrails?: A Lot Help needed moving from lying on your back to sitting on the side of a flat bed without using bedrails?: A Lot Help needed moving to and from a bed to a chair (including a wheelchair)?: Total Help needed standing up from a chair using your arms (e.g., wheelchair or bedside chair)?: Total Help needed to walk in hospital room?: Total Help needed climbing 3-5 steps with a railing? : Total 6 Click Score: 8    End of Session Equipment Utilized During Treatment: Oxygen Activity Tolerance: Patient limited by fatigue Patient left: in bed;with call bell/phone within reach;with bed alarm set Nurse Communication: Mobility status PT Visit Diagnosis: Muscle weakness (generalized) (M62.81)     Time: 6789-3810 PT Time Calculation (min) (ACUTE ONLY): 38 min  Charges:  $Therapeutic Activity: 23-37 mins                     Lorrin Goodell, PT  Office # [phone] Pager [phone]    Lorriane Shire 03/19/2019, 10:15 AM

## 2019-03-19 NOTE — Progress Notes (Signed)
  Speech Language Pathology Treatment: Dysphagia;[name] Speaking valve;Cognitive-Linquistic  Patient Details Name: Andrew Bruce MRN: [id number] DOB: 14-Apr-1965 Today's Date: 03/19/2019 Time: 0900-0930 SLP Time Calculation (min) (ACUTE ONLY): 30 min  Assessment / Plan / Recommendation Clinical Impression  Pt successful in 30 repetitions of EMST (20cm H20) with mild fatigue after 5-10 reps each. No phonation, but  improving breath support. Pt able to orally expectorate x1 with PMSV in place (ice chips, effortful swallows and assisted coughing used to facilitate mobilization). Pt demonstrates significant visual deficits when naming objects, comprehension and naming improves with verbal only tasks (responsive naming). Y/N with basic questions 100% accurate. Will continue to follow as frequently as possible to facilitate respiratory strength and pharyngeal function.    HPI HPI: [name] without a past medical history admitted 1/23 with 48 hours of worsening shortness of breath in the context of a 10-day Covid illness (dry cough, lost taste/smell). Tested positive for Covid 01/26/19. Decompensated requiring intubation 1/24 and trach'd 2/16. 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 Additional smaller right frontal convexity infarct also noted, bilateral pneumothoraces & aortic clot. Pt has developed gangrene of toes as well.        SLP Plan  Continue with current plan of care       Recommendations  Diet recommendations: NPO Medication Administration: Via alternative means                Follow up Recommendations: 24 hour supervision/assistance;Inpatient Rehab SLP Visit Diagnosis: Dysphagia, pharyngoesophageal phase (R13.14) Plan: Continue with current plan of care       GO               [name], MA CCC-SLP  Acute Rehabilitation Services Pager  [phone] Office [phone]  [name] 03/19/2019, 10:04 AM

## 2019-03-19 NOTE — Progress Notes (Signed)
PROGRESS NOTE    Andrew Bruce  JGO:[id number] DOB: January 05, 1966 DOA: 02/06/2019 PCP: Patient, No Pcp Per   Brief Narrative:  54 year old initially admitted to the hospital for acute hypoxic respiratory failure secondary to COVID-19 pneumonia followed by prolonged hospitalization and complications.  Ended up developing multiple arterial/venous thromboembolic complications including aortic thrombus, CVA, pulmonary embolism and ischemic toes.  Due to pneumothoraces he required bilateral chest tubes eventually underwent tracheostomy placement 2/16 and removal of chest tubes on 2/17.  Currently also has PEG in place.  Overall hemodynamically stable but persistent significant tracheal secretions and leukocytosis.   Assessment & Plan:   Active Problems:   COVID-19   Acute respiratory failure with hypoxia (HCC)   Pneumothorax, right   Cerebral embolism with cerebral infarction   ARDS (adult respiratory distress syndrome) (HCC)   On mechanically assisted ventilation (HCC)   Goals of care, counseling/discussion   Palliative care encounter   Pressure injury of skin  Acute hypoxic respiratory failure secondary to COVID-19 pneumonia progressed to ARDS complicated by bilateral pneumothoraces Tracheostomy in place -Continues to have copious amount of secretions.  Continue aggressive pulmonary toilet, clearing techniques. -Frequent deep suctioning by respiratory therapy -Out of bed to chair as tolerated.  Keep head level greater than 30 degree angle -Bronchodilator therapy. -Appreciate input from pulmonary.  Scopolamine and Robinul stopped. -Attempting to use PMV this morning but unclear phonation -Still requiring respiratory suctioning every 2 hours.  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 -Continue Lantus 30 units twice daily Sliding scale  Hyperlipidemia -Statin  Unstageable ulcer on coccyx, not present on admission Bilateral ear skin ulcer, stage I right ear, stage II left ear -Local wound care -Seen by wound care team.  Appreciate their recommendations.  Dysphagia with PEG tube in place.  Continue tube feeds at 65 cc/h.  Seen in discussed with speech therapy, previously CT of the head showed left occipital stroke but unable to delineate the issue of dysphagia-as it peripheral versus central.  Will obtain MRI brain as this will help speech therapy to work with him.  There is clear concerns about vocal cord paralysis/injury as patient was intubated for a prolonged period of time.  Previously seen by palliative care team for goals of care discussion-family would like to pursue full code at this time.  PT/OT-plans for CIR  DVT prophylaxis: Eliquis Code Status: Full code Family Communication: Daughter updated.  Disposition Plan:   Patient From= home  Patient Anticipated D/C place= CIR  Barriers= once is secretions have stabilized and O2 requirements have remained stable, he will go to CIR.  Consultants:   Pulmonary   Antimicrobials:  Remdesivir 1/23>>1/28 Dexamethasone 1/23 >> 2/2  Vanc 2/5 >> 2/12  Zosyn 2/5 >> 2/9 Cefepime 2/9 >> 2/11 Unasyn 2/18 >> 2/23     Subjective: Still copious amount of secretion requiring frequent suctioning.  Review of Systems Otherwise negative except as per HPI, including: General = no fevers, chills, dizziness,  fatigue HEENT/EYES = negative for loss of vision, double vision, blurred vision,  sore throa Cardiovascular= negative for chest pain, palpitation Respiratory/lungs= negative for shortness of breath, cough, wheezing; hemoptysis,  Gastrointestinal= negative for nausea, vomiting, abdominal pain Genitourinary= negative for Dysuria MSK = Negative for arthralgia,  myalgias Neurology= Negative for headache, numbness, tingling  Psychiatry= Negative for suicidal and homocidal  ideation Skin= Negative for Rash   Examination: Constitutional: Not in acute distress, tracheostomy in place Respiratory: Rhonchorous breath sounds anteriorly Cardiovascular: Normal sinus rhythm, no rubs Abdomen: Nontender nondistended good bowel sounds, PEG tube in place Musculoskeletal: No edema noted Skin: No rashes seen Neurologic: CN 2-12 grossly intact.  And nonfocal Psychiatric: Normal judgment and insight. Alert and oriented x 3. Normal mood.    Tracheostomy tube in place PEG tube in place  Objective: Vitals:   03/18/19 1549 03/19/19 0717 03/19/19 0727 03/19/19 0750  BP:    120/81  Pulse: (!) 108  96 94  Resp: 20  18 (!) 21  Temp:    98.5 F (36.9 C)  TempSrc:    Oral  SpO2: 97% 97% 95% 97%  Weight:      Height:        Intake/Output Summary (Last 24 hours) at 03/19/2019 1010 Last data filed at 03/19/2019 0720 Gross per 24 hour  Intake 10 ml  Output 900 ml  Net -890 ml   Filed Weights   03/15/19 0306 03/16/19 0438 03/17/19 0451  Weight: 70.7 kg 70.8 kg 69.8 kg     Data Reviewed:   CBC: Recent Labs  Lab 03/13/19 0534 03/14/19 0636 03/15/19 0300 03/16/19 0437 03/17/19 0513  WBC 15.4* 14.8* 22.2* 21.9* 14.4*  NEUTROABS 12.4* 12.2* 19.1* 19.0* 11.4*  HGB 10.1* 9.5* 9.6* 9.7* 9.7*  HCT 33.9* 32.7* 32.3* 32.6* 33.3*  MCV 89.2 91.3 89.5 89.6 91.5  PLT 616* 541* 484* 511* 892*   Basic Metabolic Panel: Recent Labs  Lab 03/14/19 0636 03/16/19 0437 03/17/19 0513 03/18/19 0427 03/19/19 0617  NA 145 147* 144 146* 143  K 4.0 3.8 3.7 3.6 3.6  CL 110 111 108 107 106  CO2 27 28 28 28 28   GLUCOSE 253* 207* 213* 171* 100*  BUN 28* 24* 27* 24* 24*  CREATININE 0.44* 0.41* 0.40* 0.37* 0.36*  CALCIUM 8.5* 8.7* 8.5* 8.7* 8.7*  MG  --   --   --  2.2 2.2   GFR: Estimated Creatinine Clearance: 97.5 mL/min (A) (by C-G formula based on SCr of 0.36  mg/dL (L)). Liver Function Tests: Recent Labs  Lab 03/18/19 0427 03/19/19 0617  AST 42* 43*  ALT 93* 100*  ALKPHOS 101 103  BILITOT 0.4 0.5  PROT 6.5 6.9  ALBUMIN 2.2* 2.3*   No results for input(s): LIPASE, AMYLASE in the last 168 hours. No results for input(s): AMMONIA in the last 168 hours. Coagulation Profile: No results for input(s): INR, PROTIME in the last 168 hours. Cardiac Enzymes: No results for input(s): CKTOTAL, CKMB, CKMBINDEX, TROPONINI in the last 168 hours. BNP (last 3 results) No results for input(s): PROBNP in the last 8760 hours. HbA1C: No results for input(s): HGBA1C in the last 72 hours. CBG: Recent Labs  Lab 03/18/19 1535 03/18/19 1936 03/18/19 2344 03/19/19 0416 03/19/19 0758  GLUCAP 177* 187* 152* 165* 74   Lipid Profile: No results for input(s): CHOL, HDL, LDLCALC, TRIG, CHOLHDL, LDLDIRECT in the last 72 hours. Thyroid Function Tests: No results for input(s): TSH, T4TOTAL, FREET4, T3FREE, THYROIDAB in the last 72 hours. Anemia Panel: No results for input(s): VITAMINB12, FOLATE, FERRITIN, TIBC, IRON, RETICCTPCT in the last 72 hours. Sepsis Labs: Recent Labs  Lab 03/13/19 1114 03/14/19 0636 03/15/19 0300  PROCALCITON <0.10 <0.10 <0.10    Recent Results (from the past 240 hour(s))  Culture, blood (routine x 2)     Status: None   Collection Time: 03/13/19 11:42 AM  Specimen: BLOOD LEFT WRIST  Result Value Ref Range Status   Specimen Description BLOOD LEFT WRIST  Final   Special Requests   Final    BOTTLES DRAWN AEROBIC ONLY Blood Culture adequate volume   Culture   Final    NO GROWTH 5 DAYS Performed at [facility], [address], [place], [place] [address]    Report Status 03/18/2019 FINAL  Final  Culture, blood (routine x 2)     Status: None   Collection Time: 03/13/19 11:42 AM   Specimen: BLOOD LEFT FOREARM  Result Value Ref Range Status   Specimen Description BLOOD LEFT FOREARM  Final   Special Requests   Final     BOTTLES DRAWN AEROBIC ONLY Blood Culture adequate volume   Culture   Final    NO GROWTH 5 DAYS Performed at [facility], [place] [address] [place], [place] [address]    Report Status 03/18/2019 FINAL  Final  Culture, respiratory (non-expectorated)     Status: None   Collection Time: 03/13/19  6:23 PM   Specimen: Tracheal Aspirate; Respiratory  Result Value Ref Range Status   Specimen Description TRACHEAL ASPIRATE  Final   Special Requests NONE  Final   Gram Stain   Final    MODERATE GRAM POSITIVE COCCI IN PAIRS AND CHAINS MODERATE GRAM POSITIVE RODS FEW GRAM NEGATIVE RODS FEW WBC PRESENT,BOTH PMN AND MONONUCLEAR RARE SQUAMOUS EPITHELIAL CELLS PRESENT    Culture   Final    Consistent with normal respiratory flora. Performed at [facility], [place] [address], [place], [place] [address]    Report Status 03/15/2019 FINAL  Final         Radiology Studies: DG Swallowing Func-Speech Pathology  Result Date: 03/18/2019 Objective Swallowing Evaluation: Type of Study: MBS-Modified Barium Swallow Study  Patient Details Name: Torien Ramroop MRN: [id number] Date of Birth: 06-Nov-1965 Today's Date: 03/18/2019 Time: SLP Start Time (ACUTE ONLY): 1145 -SLP Stop Time (ACUTE ONLY): 1210 SLP Time Calculation (min) (ACUTE ONLY): 25 min Past Medical History: No past medical history on file. Past Surgical History: Past Surgical History: Procedure Laterality Date . IR GASTROSTOMY TUBE MOD SED  03/10/2019 HPI: [name] without a past medical history admitted 1/23 with 48 hours of worsening shortness of breath in the context of a 10-day Covid illness (dry cough, lost taste/smell). Tested positive for Covid 01/26/19. Decompensated requiring intubation 1/24 and trach'd 2/16. 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 Additional smaller right frontal convexity infarct also noted,  bilateral pneumothoraces & aortic clot. Pt has developed gangrene of toes as well.   No data recorded Assessment / Plan / Recommendation CHL IP CLINICAL IMPRESSIONS 03/18/2019 Clinical Impression Pt demonstrates severe oropharyngeal and cervical esophageal dysphagia. Pt is able to initiate a swallow and has relatively good laryngeal elevation. Other motor movements for effective swallowing moderately reduced in ROM and strength including base of tongue retraction, hyoid excursion epiglottic deflection and pharyngeal stripping wave. Though pt does improve with an effortful swallow, he has limitied bolus passage through the cervical esophgus with worsening backflow to the pyriforms and even episodes of propulsion of bolus to the nasopharynx. Appears to also be unable to swallow secretions that also pool there. Pt aspirated before the swallow with large sips, and after the swallow due to severe residue. Cough triggered, but completely ineffective. Pt additionally unable to phonate during cough or speech. No  positional strategies very helpful. Concern for decreased UES innervation, and potentially unilaterally decreased laryngeal adduction. Appearance of impairment not consistent with deconditioning or prolonged intubation. Discussed with MD. Further diagnostic treatment needed (potentially FEES) needed for best treatment plan.  SLP Visit Diagnosis Dysphagia, pharyngoesophageal phase (R13.14) Attention and concentration deficit following -- Frontal lobe and executive function deficit following -- Impact on safety and function Severe aspiration risk   CHL IP TREATMENT RECOMMENDATION 03/12/2019 Treatment Recommendations F/U MBS in --- days (Comment);Defer until completion of intrumental exam   No flowsheet data found. CHL IP DIET RECOMMENDATION 03/18/2019 SLP Diet Recommendations NPO;Alternative means - long-term Liquid Administration via -- Medication Administration Crushed with puree Compensations -- Postural Changes --    CHL IP OTHER RECOMMENDATIONS 03/12/2019 Recommended Consults -- Oral Care Recommendations -- Other Recommendations Have oral suction available   CHL IP FOLLOW UP RECOMMENDATIONS 03/18/2019 Follow up Recommendations 24 hour supervision/assistance;Inpatient Rehab   CHL IP FREQUENCY AND DURATION 03/12/2019 Speech Therapy Frequency (ACUTE ONLY) min 2x/week Treatment Duration --      CHL IP ORAL PHASE 03/18/2019 Oral Phase WFL Oral - Pudding Teaspoon -- Oral - Pudding Cup -- Oral - Honey Teaspoon -- Oral - Honey Cup -- Oral - Nectar Teaspoon -- Oral - Nectar Cup -- Oral - Nectar Straw -- Oral - Thin Teaspoon -- Oral - Thin Cup -- Oral - Thin Straw -- Oral - Puree -- Oral - Mech Soft -- Oral - Regular -- Oral - Multi-Consistency -- Oral - Pill -- Oral Phase - Comment --  CHL IP PHARYNGEAL PHASE 03/18/2019 Pharyngeal Phase Impaired Pharyngeal- Pudding Teaspoon -- Pharyngeal -- Pharyngeal- Pudding Cup -- Pharyngeal -- Pharyngeal- Honey Teaspoon -- Pharyngeal -- Pharyngeal- Honey Cup -- Pharyngeal -- Pharyngeal- Nectar Teaspoon Penetration/Apiration after swallow;Pharyngeal residue - pyriform;Reduced epiglottic inversion;Reduced tongue base retraction;Reduced anterior laryngeal mobility Pharyngeal Material enters airway, passes BELOW cords and not ejected out despite cough attempt by patient Pharyngeal- Nectar Cup -- Pharyngeal -- Pharyngeal- Nectar Straw Penetration/Apiration after swallow;Pharyngeal residue - pyriform;Reduced epiglottic inversion;Reduced tongue base retraction;Reduced anterior laryngeal mobility;Penetration/Aspiration before swallow;Significant aspiration (Amount);Nasopharyngeal reflux Pharyngeal Material enters airway, passes BELOW cords and not ejected out despite cough attempt by patient Pharyngeal- Thin Teaspoon Penetration/Apiration after swallow;Pharyngeal residue - pyriform;Reduced epiglottic inversion;Reduced tongue base retraction;Reduced anterior laryngeal mobility Pharyngeal Material enters airway,  passes BELOW cords and not ejected out despite cough attempt by patient Pharyngeal- Thin Cup -- Pharyngeal -- Pharyngeal- Thin Straw -- Pharyngeal -- Pharyngeal- Puree Penetration/Apiration after swallow;Pharyngeal residue - pyriform;Reduced epiglottic inversion;Reduced tongue base retraction;Reduced anterior laryngeal mobility Pharyngeal -- Pharyngeal- Mechanical Soft -- Pharyngeal -- Pharyngeal- Regular -- Pharyngeal -- Pharyngeal- Multi-consistency -- Pharyngeal -- Pharyngeal- Pill -- Pharyngeal -- Pharyngeal Comment --  No flowsheet data found. Herbie [place], MA CCC-SLP Acute Rehabilitation Services Pager [phone] Office [phone] [name] 03/18/2019, 4:32 PM                   Scheduled Meds: . apixaban  5 mg Per Tube BID  . vitamin C  500 mg Per Tube Daily  . atorvastatin  20 mg Per Tube q1800  . chlorhexidine gluconate (MEDLINE KIT)  15 mL Mouth Rinse BID  . Chlorhexidine Gluconate Cloth  6 each Topical Daily  . collagenase   Topical Daily  . doxazosin  1 mg Per Tube Daily  . famotidine  20 mg Per Tube BID  . feeding supplement (PRO-STAT SUGAR FREE 64)  30 mL Per Tube TID  . folic acid  1 mg Per  Tube Daily  . insulin aspart  0-20 Units Subcutaneous Q4H  . insulin detemir  30 Units Subcutaneous BID  . ipratropium-albuterol  3 mL Nebulization 4x daily  . mouth rinse  15 mL Mouth Rinse 10 times per day  . multivitamin  15 mL Per Tube Daily  . nutrition supplement (JUVEN)  1 packet Per Tube BID BM  . polyethylene glycol  17 g Per Tube Daily  . senna-docusate  1 tablet Per Tube BID  . sodium chloride flush  10-40 mL Intracatheter Q12H  . thiamine  100 mg Per Tube Daily  . zinc sulfate  220 mg Per Tube Daily   Continuous Infusions: . sodium chloride Stopped (03/12/19 1606)  . feeding supplement (OSMOLITE 1.5 CAL) 1,000 mL (03/19/19 0327)     LOS: 41 days   Time spent= 35 mins    Trinitie Mcgirr Arsenio Loader, MD [facility]  If 7PM-7AM, please contact  night-coverage  03/19/2019, 10:10 AM

## 2019-03-19 NOTE — Progress Notes (Signed)
PT Cancellation Note  Patient Details Name: Andrew Bruce MRN: [id number] DOB: [birth year]   Cancelled Treatment:    Reason Eval/Treat Not Completed: Patient at procedure or test/unavailable.  Pt at procedure or testing all afternoon.  Were unable to complete hydrotherapy. 03/19/2019  Jacinto Halim., PT Acute Rehabilitation Services [phone]  (pager) [phone]  (office)   [name] 03/19/2019, 5:16 PM

## 2019-03-20 LAB — CBC
HCT: 35 % — ABNORMAL LOW (ref 39.0–52.0)
Hemoglobin: 10.5 g/dL — ABNORMAL LOW (ref 13.0–17.0)
MCH: 26.3 pg (ref 26.0–34.0)
MCHC: 30 g/dL (ref 30.0–36.0)
MCV: 87.7 fL (ref 80.0–100.0)
Platelets: 477 10*3/uL — ABNORMAL HIGH (ref 150–400)
RBC: 3.99 MIL/uL — ABNORMAL LOW (ref 4.22–5.81)
RDW: 16.7 % — ABNORMAL HIGH (ref 11.5–15.5)
WBC: 9.6 10*3/uL (ref 4.0–10.5)
nRBC: 0 % (ref 0.0–0.2)

## 2019-03-20 LAB — GLUCOSE, CAPILLARY
Glucose-Capillary: 132 mg/dL — ABNORMAL HIGH (ref 70–99)
Glucose-Capillary: 145 mg/dL — ABNORMAL HIGH (ref 70–99)
Glucose-Capillary: 156 mg/dL — ABNORMAL HIGH (ref 70–99)
Glucose-Capillary: 158 mg/dL — ABNORMAL HIGH (ref 70–99)
Glucose-Capillary: 172 mg/dL — ABNORMAL HIGH (ref 70–99)
Glucose-Capillary: 180 mg/dL — ABNORMAL HIGH (ref 70–99)
Glucose-Capillary: 71 mg/dL (ref 70–99)

## 2019-03-20 LAB — BASIC METABOLIC PANEL
Anion gap: 10 (ref 5–15)
BUN: 25 mg/dL — ABNORMAL HIGH (ref 6–20)
CO2: 28 mmol/L (ref 22–32)
Calcium: 8.7 mg/dL — ABNORMAL LOW (ref 8.9–10.3)
Chloride: 102 mmol/L (ref 98–111)
Creatinine, Ser: 0.39 mg/dL — ABNORMAL LOW (ref 0.61–1.24)
GFR calc Af Amer: >60 mL/min (ref >60)
GFR calc non Af Amer: >60 mL/min (ref >60)
Glucose, Bld: 121 mg/dL — ABNORMAL HIGH (ref 70–99)
Potassium: 3.7 mmol/L (ref 3.5–5.1)
Sodium: 140 mmol/L (ref 135–145)

## 2019-03-20 LAB — MAGNESIUM: Magnesium: 2.3 mg/dL (ref 1.7–2.4)

## 2019-03-20 NOTE — Progress Notes (Signed)
PROGRESS NOTE    Andrew Bruce  YFV:[id number] DOB: 03/12/1965 DOA: 02/06/2019 PCP: Patient, No Pcp Per   Brief Narrative:  54 year old initially admitted to the hospital for acute hypoxic respiratory failure secondary to COVID-19 pneumonia followed by prolonged hospitalization and complications.  Ended up developing multiple arterial/venous thromboembolic complications including aortic thrombus, CVA, pulmonary embolism and ischemic toes.  Due to pneumothoraces he required bilateral chest tubes eventually underwent tracheostomy placement 2/16 and removal of chest tubes on 2/17.  Currently also has PEG in place.  Overall hemodynamically stable but persistent significant tracheal secretions and leukocytosis.   Assessment & Plan:   Active Problems:   COVID-19   Acute respiratory failure with hypoxia (HCC)   Pneumothorax, right   Cerebral embolism with cerebral infarction   ARDS (adult respiratory distress syndrome) (HCC)   On mechanically assisted ventilation (HCC)   Goals of care, counseling/discussion   Palliative care encounter   Pressure injury of skin   Status post tracheostomy ([place])  Acute hypoxic respiratory failure secondary to COVID-19 pneumonia progressed to ARDS complicated by bilateral pneumothoraces Tracheostomy in place -Continues to have copious amount of secretions.  Continue aggressive pulmonary toilet, clearing techniques. -Frequent deep suctioning by respiratory therapy -Out of bed to chair as tolerated.  Keep head level greater than 30 degree angle -Bronchodilator therapy. -Appreciate input from pulmonary.  Scopolamine and Robinul stopped. Possible down sizing of his trach next week.  -Attempting to use PMV this morning but unclear phonation -Still requiring respiratory suctioning every 2 hours.  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    Uncontrolled diabetes mellitus type 2, poorly controlled due to hyperglycemia -Hemoglobin A1c 11.7. -Continue Lantus 30 units twice daily Sliding scale  Hyperlipidemia -Statin  Unstageable ulcer on coccyx, not present on admission Bilateral ear skin ulcer, stage I right ear, stage II left ear -Local wound care -Seen by wound care team.  Appreciate their recommendations.  Dysphagia with PEG tube in place.  Continue tube feeds at 65 cc/h.  MRI shows multifocal areas of infarct secondary to CVA.  Speech therapy will continue to work with him.  Previously seen by palliative care team for goals of care discussion-family would like to pursue full code at this time.  PT/OT-plans for CIR  DVT prophylaxis: Eliquis Code Status: Full code Family Communication: Daughter, no answer. Disposition Plan:   Patient From= home  Patient Anticipated D/C place= CIR  Barriers= once is secretions have stabilized and O2 requirements have remained stable, he will go to CIR.  Consultants:   Pulmonary   Antimicrobials:  Remdesivir 1/23>>1/28 Dexamethasone 1/23 >> 2/2  Vanc 2/5 >> 2/12  Zosyn 2/5 >> 2/9 Cefepime 2/9 >> 2/11 Unasyn 2/18 >> 2/23     Subjective: No complaints, continues to have copious amount of secretions requiring frequent suctioning.  Attempts to use PMV but no phonation.  Review of Systems Otherwise negative except as per HPI, including: General = no fevers, chills, dizziness,  fatigue HEENT/EYES = negative for loss of vision, double vision, blurred vision,  sore throa Cardiovascular= negative for chest pain, palpitation Respiratory/lungs= negative for shortness of breath, cough, wheezing; hemoptysis,  Gastrointestinal= negative for nausea, vomiting, abdominal pain Genitourinary= negative for Dysuria MSK = Negative for arthralgia, myalgias Neurology= Negative for headache, numbness, tingling  Psychiatry= Negative for  suicidal and homocidal ideation Skin= Negative for Rash   Examination: Constitutional: Not in  acute distress Respiratory: Trach in place.  Anterior rhonchorous breath sounds Cardiovascular: Normal sinus rhythm, no rubs Abdomen: Nontender nondistended good bowel sounds, PEG in place Musculoskeletal: No edema noted Skin: No rashes seen Neurologic: CN 2-12 grossly intact.  And nonfocal Psychiatric: Normal judgment and insight. Alert and oriented x 3. Normal mood.     Tracheostomy tube in place PEG tube in place  Objective: Vitals:   03/20/19 0306 03/20/19 0403 03/20/19 0736 03/20/19 0831  BP: 115/82  101/87   Pulse:  (!) 101 97   Resp:  20 19   Temp: 98 F (36.7 C)  97.9 F (36.6 C)   TempSrc: Oral  Oral   SpO2: 94% 96% 98% 96%  Weight:      Height:        Intake/Output Summary (Last 24 hours) at 03/20/2019 0957 Last data filed at 03/19/2019 2136 Gross per 24 hour  Intake 10 ml  Output 700 ml  Net -690 ml   Filed Weights   03/15/19 0306 03/16/19 0438 03/17/19 0451  Weight: 70.7 kg 70.8 kg 69.8 kg     Data Reviewed:   CBC: Recent Labs  Lab 03/14/19 0636 03/15/19 0300 03/16/19 0437 03/17/19 0513 03/20/19 0539  WBC 14.8* 22.2* 21.9* 14.4* 9.6  NEUTROABS 12.2* 19.1* 19.0* 11.4*  --   HGB 9.5* 9.6* 9.7* 9.7* 10.5*  HCT 32.7* 32.3* 32.6* 33.3* 35.0*  MCV 91.3 89.5 89.6 91.5 87.7  PLT 541* 484* 511* 491* 010*   Basic Metabolic Panel: Recent Labs  Lab 03/16/19 0437 03/17/19 0513 03/18/19 0427 03/19/19 0617 03/20/19 0539  NA 147* 144 146* 143 140  K 3.8 3.7 3.6 3.6 3.7  CL 111 108 107 106 102  CO2 28 28 28 28 28   GLUCOSE 207* 213* 171* 100* 121*  BUN 24* 27* 24* 24* 25*  CREATININE 0.41* 0.40* 0.37* 0.36* 0.39*  CALCIUM 8.7* 8.5* 8.7* 8.7* 8.7*  MG  --   --  2.2 2.2 2.3   GFR: Estimated Creatinine Clearance: 97.5 mL/min (A) (by C-G formula based on SCr of 0.39 mg/dL (L)). Liver Function Tests: Recent Labs  Lab 03/18/19 0427 03/19/19 0617  AST  42* 43*  ALT 93* 100*  ALKPHOS 101 103  BILITOT 0.4 0.5  PROT 6.5 6.9  ALBUMIN 2.2* 2.3*   No results for input(s): LIPASE, AMYLASE in the last 168 hours. No results for input(s): AMMONIA in the last 168 hours. Coagulation Profile: No results for input(s): INR, PROTIME in the last 168 hours. Cardiac Enzymes: No results for input(s): CKTOTAL, CKMB, CKMBINDEX, TROPONINI in the last 168 hours. BNP (last 3 results) No results for input(s): PROBNP in the last 8760 hours. HbA1C: No results for input(s): HGBA1C in the last 72 hours. CBG: Recent Labs  Lab 03/19/19 1607 03/19/19 1928 03/19/19 2359 03/20/19 0308 03/20/19 0732  GLUCAP 106* 170* 132* 156* 71   Lipid Profile: No results for input(s): CHOL, HDL, LDLCALC, TRIG, CHOLHDL, LDLDIRECT in the last 72 hours. Thyroid Function Tests: No results for input(s): TSH, T4TOTAL, FREET4, T3FREE, THYROIDAB in the last 72 hours. Anemia Panel: No results for input(s): VITAMINB12, FOLATE, FERRITIN, TIBC, IRON, RETICCTPCT in the last 72 hours. Sepsis Labs: Recent Labs  Lab 03/13/19 1114 03/14/19 0636 03/15/19 0300  PROCALCITON <0.10 <0.10 <0.10    Recent Results (from the past 240 hour(s))  Culture, blood (routine x 2)     Status: None   Collection Time: 03/13/19 11:42 AM   Specimen: BLOOD LEFT WRIST  Result  Value Ref Range Status   Specimen Description BLOOD LEFT WRIST  Final   Special Requests   Final    BOTTLES DRAWN AEROBIC ONLY Blood Culture adequate volume   Culture   Final    NO GROWTH 5 DAYS Performed at [facility], [address], [place], [place] [address]    Report Status 03/18/2019 FINAL  Final  Culture, blood (routine x 2)     Status: None   Collection Time: 03/13/19 11:42 AM   Specimen: BLOOD LEFT FOREARM  Result Value Ref Range Status   Specimen Description BLOOD LEFT FOREARM  Final   Special Requests   Final    BOTTLES DRAWN AEROBIC ONLY Blood Culture adequate volume   Culture   Final    NO GROWTH 5  DAYS Performed at [facility], [place] [address], [place], [place] [address]    Report Status 03/18/2019 FINAL  Final  Culture, respiratory (non-expectorated)     Status: None   Collection Time: 03/13/19  6:23 PM   Specimen: Tracheal Aspirate; Respiratory  Result Value Ref Range Status   Specimen Description TRACHEAL ASPIRATE  Final   Special Requests NONE  Final   Gram Stain   Final    MODERATE GRAM POSITIVE COCCI IN PAIRS AND CHAINS MODERATE GRAM POSITIVE RODS FEW GRAM NEGATIVE RODS FEW WBC PRESENT,BOTH PMN AND MONONUCLEAR RARE SQUAMOUS EPITHELIAL CELLS PRESENT    Culture   Final    Consistent with normal respiratory flora. Performed at [facility], [place] [address] [place], [place] [address]    Report Status 03/15/2019 FINAL  Final         Radiology Studies: MR BRAIN WO CONTRAST  Result Date: 03/19/2019 CLINICAL DATA:  Encephalopathy. Dysphagia. Acute hypoxic respiratory failure related to COVID-19 pneumonia with secondary thromboembolic events. EXAM: MRI HEAD WITHOUT CONTRAST TECHNIQUE: Multiplanar, multiecho pulse sequences of the brain and surrounding structures were obtained without intravenous contrast. COMPARISON:  Head CT February 16, 2019 FINDINGS: Brain: No acute infarction, hemorrhage, hydrocephalus, extra-axial collection or mass lesion. Subacute infarcts are noted scattered throughout the brain parenchyma including bilateral frontoparietal regions, splenium of the corpus callosum on the right side and most significantly left occipital lobe. There are also chronic bilateral cerebellar infarcts. Susceptibility artifact is noted on the susceptibility weighted images associated with these infarcts, consistent with hemosiderin deposit. Vascular: Normal flow voids. Skull and upper cervical spine: Normal marrow signal. Sinuses/Orbits: Mucosal thickening in the left maxillary sinus. Other: Bilateral mastoid effusion. IMPRESSION: 1. No acute intracranial abnormality  identified. 2. Subacute infarcts scattered throughout the brain parenchyma including bilateral frontoparietal regions, splenium of corpus callosum on the right side and most significantly left occipital lobe. 3. Chronic bilateral cerebellar infarcts. Electronically Signed   By: Pedro Earls M.D.   On: 03/19/2019 14:24   DG Swallowing Func-Speech Pathology  Result Date: 03/18/2019 Objective Swallowing Evaluation: Type of Study: MBS-Modified Barium Swallow Study  Patient Details Name: Madden Garron MRN: [id number] Date of Birth: September 26, 1965 Today's Date: 03/18/2019 Time: SLP Start Time (ACUTE ONLY): 1145 -SLP Stop Time (ACUTE ONLY): 1210 SLP Time Calculation (min) (ACUTE ONLY): 25 min Past Medical History: No past medical history on file. Past Surgical History: Past Surgical History: Procedure Laterality Date . IR GASTROSTOMY TUBE MOD SED  03/10/2019 HPI: [name] without a past medical history admitted 1/23 with 48 hours of worsening shortness of breath in the context of a 10-day Covid illness (dry cough, lost taste/smell). Tested positive  for Covid 01/26/19. Decompensated requiring intubation 1/24 and trach'd 2/16. 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 Additional smaller right frontal convexity infarct also noted, bilateral pneumothoraces & aortic clot. Pt has developed gangrene of toes as well.   No data recorded Assessment / Plan / Recommendation CHL IP CLINICAL IMPRESSIONS 03/18/2019 Clinical Impression Pt demonstrates severe oropharyngeal and cervical esophageal dysphagia. Pt is able to initiate a swallow and has relatively good laryngeal elevation. Other motor movements for effective swallowing moderately reduced in ROM and strength including base of tongue retraction, hyoid excursion epiglottic deflection and pharyngeal stripping wave. Though pt does improve with an  effortful swallow, he has limitied bolus passage through the cervical esophgus with worsening backflow to the pyriforms and even episodes of propulsion of bolus to the nasopharynx. Appears to also be unable to swallow secretions that also pool there. Pt aspirated before the swallow with large sips, and after the swallow due to severe residue. Cough triggered, but completely ineffective. Pt additionally unable to phonate during cough or speech. No positional strategies very helpful. Concern for decreased UES innervation, and potentially unilaterally decreased laryngeal adduction. Appearance of impairment not consistent with deconditioning or prolonged intubation. Discussed with MD. Further diagnostic treatment needed (potentially FEES) needed for best treatment plan.  SLP Visit Diagnosis Dysphagia, pharyngoesophageal phase (R13.14) Attention and concentration deficit following -- Frontal lobe and executive function deficit following -- Impact on safety and function Severe aspiration risk   CHL IP TREATMENT RECOMMENDATION 03/12/2019 Treatment Recommendations F/U MBS in --- days (Comment);Defer until completion of intrumental exam   No flowsheet data found. CHL IP DIET RECOMMENDATION 03/18/2019 SLP Diet Recommendations NPO;Alternative means - long-term Liquid Administration via -- Medication Administration Crushed with puree Compensations -- Postural Changes --   CHL IP OTHER RECOMMENDATIONS 03/12/2019 Recommended Consults -- Oral Care Recommendations -- Other Recommendations Have oral suction available   CHL IP FOLLOW UP RECOMMENDATIONS 03/18/2019 Follow up Recommendations 24 hour supervision/assistance;Inpatient Rehab   CHL IP FREQUENCY AND DURATION 03/12/2019 Speech Therapy Frequency (ACUTE ONLY) min 2x/week Treatment Duration --      CHL IP ORAL PHASE 03/18/2019 Oral Phase WFL Oral - Pudding Teaspoon -- Oral - Pudding Cup -- Oral - Honey Teaspoon -- Oral - Honey Cup -- Oral - Nectar Teaspoon -- Oral - Nectar Cup -- Oral -  Nectar Straw -- Oral - Thin Teaspoon -- Oral - Thin Cup -- Oral - Thin Straw -- Oral - Puree -- Oral - Mech Soft -- Oral - Regular -- Oral - Multi-Consistency -- Oral - Pill -- Oral Phase - Comment --  CHL IP PHARYNGEAL PHASE 03/18/2019 Pharyngeal Phase Impaired Pharyngeal- Pudding Teaspoon -- Pharyngeal -- Pharyngeal- Pudding Cup -- Pharyngeal -- Pharyngeal- Honey Teaspoon -- Pharyngeal -- Pharyngeal- Honey Cup -- Pharyngeal -- Pharyngeal- Nectar Teaspoon Penetration/Apiration after swallow;Pharyngeal residue - pyriform;Reduced epiglottic inversion;Reduced tongue base retraction;Reduced anterior laryngeal mobility Pharyngeal Material enters airway, passes BELOW cords and not ejected out despite cough attempt by patient Pharyngeal- Nectar Cup -- Pharyngeal -- Pharyngeal- Nectar Straw Penetration/Apiration after swallow;Pharyngeal residue - pyriform;Reduced epiglottic inversion;Reduced tongue base retraction;Reduced anterior laryngeal mobility;Penetration/Aspiration before swallow;Significant aspiration (Amount);Nasopharyngeal reflux Pharyngeal Material enters airway, passes BELOW cords and not ejected out despite cough attempt by patient Pharyngeal- Thin Teaspoon Penetration/Apiration after swallow;Pharyngeal residue - pyriform;Reduced epiglottic inversion;Reduced tongue base retraction;Reduced anterior laryngeal mobility Pharyngeal Material enters airway, passes BELOW cords and not ejected out despite cough attempt by patient Pharyngeal-  Thin Cup -- Pharyngeal -- Pharyngeal- Thin Straw -- Pharyngeal -- Pharyngeal- Puree Penetration/Apiration after swallow;Pharyngeal residue - pyriform;Reduced epiglottic inversion;Reduced tongue base retraction;Reduced anterior laryngeal mobility Pharyngeal -- Pharyngeal- Mechanical Soft -- Pharyngeal -- Pharyngeal- Regular -- Pharyngeal -- Pharyngeal- Multi-consistency -- Pharyngeal -- Pharyngeal- Pill -- Pharyngeal -- Pharyngeal Comment --  No flowsheet data found. Herbie Baltimore,  MA CCC-SLP Acute Rehabilitation Services Pager [phone] Office [phone] [name] 03/18/2019, 4:32 PM                   Scheduled Meds: . apixaban  5 mg Per Tube BID  . vitamin C  500 mg Per Tube Daily  . atorvastatin  20 mg Per Tube q1800  . chlorhexidine gluconate (MEDLINE KIT)  15 mL Mouth Rinse BID  . Chlorhexidine Gluconate Cloth  6 each Topical Daily  . collagenase   Topical Daily  . doxazosin  1 mg Per Tube Daily  . famotidine  20 mg Per Tube BID  . feeding supplement (PRO-STAT SUGAR FREE 64)  30 mL Per Tube TID  . folic acid  1 mg Per Tube Daily  . insulin aspart  0-20 Units Subcutaneous Q4H  . insulin detemir  30 Units Subcutaneous BID  . ipratropium-albuterol  3 mL Nebulization 4x daily  . mouth rinse  15 mL Mouth Rinse 10 times per day  . multivitamin  15 mL Per Tube Daily  . nutrition supplement (JUVEN)  1 packet Per Tube BID BM  . polyethylene glycol  17 g Per Tube Daily  . senna-docusate  1 tablet Per Tube BID  . sodium chloride flush  10-40 mL Intracatheter Q12H  . thiamine  100 mg Per Tube Daily  . zinc sulfate  220 mg Per Tube Daily   Continuous Infusions: . sodium chloride Stopped (03/12/19 1606)  . feeding supplement (OSMOLITE 1.5 CAL) 1,000 mL (03/19/19 2155)     LOS: 42 days   Time spent= 35 mins    Lylee Corrow Arsenio Loader, MD [facility]  If 7PM-7AM, please contact night-coverage  03/20/2019, 9:57 AM

## 2019-03-20 NOTE — Progress Notes (Signed)
Physical Therapy Wound Treatment Patient Details  Name: Andrew Bruce MRN: [id number] Date of Birth: Jun 19, 1965  Today's Date: 03/20/2019 Time: 7366-8159 Time Calculation (min): 35 min  Subjective  Subjective: Pt with no attempts to speak despite use of Spanish Patient and Family Stated Goals: None stated  Pain Score: Pain Score: 0-No pain  Wound Assessment  Pressure Injury 02/22/19 Coccyx Medial Unstageable - Full thickness tissue loss in which the base of the injury is covered by slough (yellow, tan, gray, green or brown) and/or eschar (tan, brown or black) in the wound bed. quarter-sized/purple  (Active)  Dressing Type Barrier Film (skin prep);Foam - Lift dressing to assess site every shift;Moist to moist;Other (Comment) 03/20/19 0957  Dressing Changed;Clean;Dry;Intact 03/20/19 0957  Dressing Change Frequency Daily 03/20/19 0957  State of Healing Other (Comment) 03/20/19 0957  Site / Wound Assessment Yellow;Pink 03/20/19 0957  % Wound base Red or Granulating 15% 03/20/19 0957  % Wound base Yellow/Fibrinous Exudate 85% 03/20/19 0957  % Wound base Black/Eschar 0% 03/20/19 0957  Peri-wound Assessment Intact;Pink 03/20/19 0957  Wound Length (cm) 4.5 cm 03/17/19 1146  Wound Width (cm) 2.5 cm 03/17/19 1146  Wound Depth (cm) 0.1 cm 03/17/19 1146  Wound Surface Area (cm^2) 11.25 cm^2 03/17/19 1146  Wound Volume (cm^3) 1.12 cm^3 03/17/19 1146  Tunneling (cm) 0 03/13/19 1843  Undermining (cm) 0 03/13/19 1843  Margins Unattached edges (unapproximated) 03/20/19 0957  Drainage Amount Minimal 03/20/19 0957  Drainage Description No odor 03/20/19 0957  Treatment Debridement (Selective);Hydrotherapy (Pulse lavage) 03/20/19 0957     Santyl applied to wound bed prior to applying dressing.  Hydrotherapy Pulsed lavage therapy - wound location: Sacrum Pulsed Lavage with Suction (psi): 12 psi Pulsed Lavage with Suction - Normal Saline Used: 1000 mL Pulsed Lavage Tip: Tip with splash  shield Selective Debridement Selective Debridement - Location: Sacrum Selective Debridement - Tools Used: Forceps;Scalpel(scored yellow eschar) Selective Debridement - Tissue Removed: Thick, yellow slough   Wound Assessment and Plan  Wound Therapy - Assess/Plan/Recommendations Wound Therapy - Clinical Statement: Progressing with debridement. Pre-medicated for pain as recommended, with no indications of pain noted. He will benefit from continued hydrotherapy for selective removal of unviable tissue, to decrease bioburden, and promote wound bed healing.  Wound Therapy - Functional Problem List: Global weakness s/p COVID, CVA's, aortic clot Factors Delaying/Impairing Wound Healing: Multiple medical problems;Immobility Hydrotherapy Plan: Debridement;Dressing change;Patient/family education;Pulsatile lavage with suction Wound Therapy - Frequency: 6X / week Wound Therapy - Follow Up Recommendations: Skilled nursing facility Wound Plan: See above  Wound Therapy Goals- Improve the function of patient's integumentary system by progressing the wound(s) through the phases of wound healing (inflammation - proliferation - remodeling) by: Decrease Necrotic Tissue to: 0 Decrease Necrotic Tissue - Progress: Progressing toward goal Increase Granulation Tissue to: 100 Increase Granulation Tissue - Progress: Progressing toward goal Goals/treatment plan/discharge plan were made with and agreed upon by patient/family: Yes Wound Therapy - Potential for Goals: Good  Goals will be updated until maximal potential achieved or discharge criteria met.  Discharge criteria: when goals achieved, discharge from hospital, MD decision/surgical intervention, no progress towards goals, refusal/missing three consecutive treatments without notification or medical reason.  GP      Arby Barrette, PT Pager [phone]  Jeanie Cooks Ayelen Sciortino 03/20/2019, 10:08 AM

## 2019-03-20 NOTE — Care Management (Signed)
Consult for cost of Eliquis. Patient is uninsured. Out of pocket cost will be around $450/month. Is eligible for a 30 day supply w manufacturer coupon, but will need set up with a [facility] with access to [facility] low cost pharmacy for follow up at [place]. Once medicaid established, Eliquis will be covered at around $4/ month.

## 2019-03-21 LAB — CBC
HCT: 36.1 % — ABNORMAL LOW (ref 39.0–52.0)
Hemoglobin: 10.8 g/dL — ABNORMAL LOW (ref 13.0–17.0)
MCH: 26.3 pg (ref 26.0–34.0)
MCHC: 29.9 g/dL — ABNORMAL LOW (ref 30.0–36.0)
MCV: 87.8 fL (ref 80.0–100.0)
Platelets: 459 10*3/uL — ABNORMAL HIGH (ref 150–400)
RBC: 4.11 MIL/uL — ABNORMAL LOW (ref 4.22–5.81)
RDW: 16.9 % — ABNORMAL HIGH (ref 11.5–15.5)
WBC: 9.6 10*3/uL (ref 4.0–10.5)
nRBC: 0 % (ref 0.0–0.2)

## 2019-03-21 LAB — BASIC METABOLIC PANEL
Anion gap: 11 (ref 5–15)
BUN: 28 mg/dL — ABNORMAL HIGH (ref 6–20)
CO2: 26 mmol/L (ref 22–32)
Calcium: 8.6 mg/dL — ABNORMAL LOW (ref 8.9–10.3)
Chloride: 101 mmol/L (ref 98–111)
Creatinine, Ser: 0.39 mg/dL — ABNORMAL LOW (ref 0.61–1.24)
GFR calc Af Amer: >60 mL/min (ref >60)
GFR calc non Af Amer: >60 mL/min (ref >60)
Glucose, Bld: 150 mg/dL — ABNORMAL HIGH (ref 70–99)
Potassium: 4 mmol/L (ref 3.5–5.1)
Sodium: 138 mmol/L (ref 135–145)

## 2019-03-21 LAB — GLUCOSE, CAPILLARY
Glucose-Capillary: 109 mg/dL — ABNORMAL HIGH (ref 70–99)
Glucose-Capillary: 184 mg/dL — ABNORMAL HIGH (ref 70–99)
Glucose-Capillary: 197 mg/dL — ABNORMAL HIGH (ref 70–99)
Glucose-Capillary: 155 mg/dL — ABNORMAL HIGH (ref 70–99)
Glucose-Capillary: 173 mg/dL — ABNORMAL HIGH (ref 70–99)
Glucose-Capillary: 150 mg/dL — ABNORMAL HIGH (ref 70–99)
Glucose-Capillary: 125 mg/dL — ABNORMAL HIGH (ref 70–99)

## 2019-03-21 LAB — TSH: TSH: 4.156 u[IU]/mL (ref 0.350–4.500)

## 2019-03-21 LAB — MAGNESIUM: Magnesium: 2.2 mg/dL (ref 1.7–2.4)

## 2019-03-21 MED ORDER — IPRATROPIUM-ALBUTEROL 0.5-2.5 (3) MG/3ML IN SOLN: 3.0000 mL | RESPIRATORY_TRACT | Status: DC | PRN

## 2019-03-21 NOTE — Progress Notes (Signed)
PROGRESS NOTE    Andrew Bruce  ZOX:[id number] DOB: [birth year] DOA: 02/06/2019 PCP: Patient, No Pcp Per   Brief Narrative:  54 year old initially admitted to the hospital for acute hypoxic respiratory failure secondary to COVID-19 pneumonia followed by prolonged hospitalization and complications.  Ended up developing multiple arterial/venous thromboembolic complications including aortic thrombus, CVA, pulmonary embolism and ischemic toes.  Due to pneumothoraces he required bilateral chest tubes eventually underwent tracheostomy placement 2/16 and removal of chest tubes on 2/17.  Currently also has PEG in place.  Overall hemodynamically stable but persistent significant tracheal secretions and leukocytosis.   Assessment & Plan:   Active Problems:   COVID-19   Acute respiratory failure with hypoxia (HCC)   Pneumothorax, right   Cerebral embolism with cerebral infarction   ARDS (adult respiratory distress syndrome) (HCC)   On mechanically assisted ventilation (HCC)   Goals of care, counseling/discussion   Palliative care encounter   Pressure injury of skin   Status post tracheostomy ([place])  Acute hypoxic respiratory failure secondary to COVID-19 pneumonia progressed to ARDS complicated by bilateral pneumothoraces Tracheostomy in place -Copious amount of secretions, aggressive pulmonary toilet and clearing techniques. -Frequent deep suctioning by respiratory therapy -Out of bed to chair as tolerated.  Keep head level greater than 30 degree angle -Bronchodilator therapy. -Appreciate input from pulmonary.  Scopolamine and Robinul stopped. Possible down sizing of his trach next week.  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    Uncontrolled diabetes mellitus type 2, poorly controlled due to hyperglycemia -Hemoglobin A1c 11.7. -Continue Lantus 30 units twice daily Sliding scale  Hyperlipidemia -Statin  Unstageable ulcer on coccyx, not present on admission Bilateral ear skin ulcer, stage I right ear, stage II left ear -Local wound care -Seen by wound care team.  Appreciate their recommendations.  Dysphagia with PEG tube in place.  Continue tube feeds at 65 cc/h.  MRI shows multifocal areas of infarct secondary to CVA.  Speech therapy will continue to work with him.  Previously seen by palliative care team for goals of care discussion-family would like to pursue full code at this time.  PT/OT-plans for CIR  DVT prophylaxis: Eliquis Code Status: Full code Family Communication: Daughter, no answer. Disposition Plan:   Patient From= home  Patient Anticipated D/C place= CIR  Barriers= once is secretions have stabilized and O2 requirements have remained stable, he will go to CIR.  Consultants:   Pulmonary   Antimicrobials:  Remdesivir 1/23>>1/28 Dexamethasone 1/23 >> 2/2  Vanc 2/5 >> 2/12  Zosyn 2/5 >> 2/9 Cefepime 2/9 >> 2/11 Unasyn 2/18 >> 2/23     Subjective: No complaints, continues to have copious amount of secretions from his tracheostomy site.  Review of Systems Otherwise negative except as per HPI, including: General = no fevers, chills, dizziness,  fatigue HEENT/EYES = negative for loss of vision, double vision, blurred vision,  sore throa Cardiovascular= negative for chest pain, palpitation Respiratory/lungs= negative for shortness of breath, cough, wheezing; hemoptysis,  Gastrointestinal= negative for nausea, vomiting, abdominal pain Genitourinary= negative for Dysuria MSK = Negative for arthralgia, myalgias Neurology= Negative for headache, numbness, tingling  Psychiatry= Negative for suicidal and homocidal ideation Skin=  Negative for Rash   Examination: Constitutional: Not in acute distress, generally appears very weak. Respiratory:  Trach in place, anterior breath sounds very rhonchorous but slightly improved from yesterday. Cardiovascular: Normal sinus rhythm, no rubs Abdomen: Nontender nondistended good bowel sounds, PEG tube in place Musculoskeletal: No edema noted Skin: Multiple areas of ulcers Neurologic: CN 2-12 grossly intact.  And nonfocal Psychiatric: Normal judgment and insight. Alert and oriented x 3. Normal mood.   Tracheostomy tube in place PEG tube in place  Objective: Vitals:   03/21/19 0303 03/21/19 0405 03/21/19 0853 03/21/19 0923  BP:    117/82  Pulse:  98 (!) 102 (!) 104  Resp:  18 20 (!) 22  Temp: 98.6 F (37 C)   98.4 F (36.9 C)  TempSrc: Oral   Oral  SpO2:  96% 96% 98%  Weight:      Height:        Intake/Output Summary (Last 24 hours) at 03/21/2019 1017 Last data filed at 03/21/2019 0515 Gross per 24 hour  Intake --  Output 2400 ml  Net -2400 ml   Filed Weights   03/15/19 0306 03/16/19 0438 03/17/19 0451  Weight: 70.7 kg 70.8 kg 69.8 kg     Data Reviewed:   CBC: Recent Labs  Lab 03/15/19 0300 03/16/19 0437 03/17/19 0513 03/20/19 0539 03/21/19 0044  WBC 22.2* 21.9* 14.4* 9.6 9.6  NEUTROABS 19.1* 19.0* 11.4*  --   --   HGB 9.6* 9.7* 9.7* 10.5* 10.8*  HCT 32.3* 32.6* 33.3* 35.0* 36.1*  MCV 89.5 89.6 91.5 87.7 87.8  PLT 484* 511* 491* 477* 852*   Basic Metabolic Panel: Recent Labs  Lab 03/17/19 0513 03/18/19 0427 03/19/19 0617 03/20/19 0539 03/21/19 0044  NA 144 146* 143 140 138  K 3.7 3.6 3.6 3.7 4.0  CL 108 107 106 102 101  CO2 28 28 28 28 26   GLUCOSE 213* 171* 100* 121* 150*  BUN 27* 24* 24* 25* 28*  CREATININE 0.40* 0.37* 0.36* 0.39* 0.39*  CALCIUM 8.5* 8.7* 8.7* 8.7* 8.6*  MG  --  2.2 2.2 2.3 2.2   GFR: Estimated Creatinine Clearance: 97.5 mL/min (A) (by C-G formula based on SCr of 0.39 mg/dL (L)). Liver Function Tests: Recent Labs   Lab 03/18/19 0427 03/19/19 0617  AST 42* 43*  ALT 93* 100*  ALKPHOS 101 103  BILITOT 0.4 0.5  PROT 6.5 6.9  ALBUMIN 2.2* 2.3*   No results for input(s): LIPASE, AMYLASE in the last 168 hours. No results for input(s): AMMONIA in the last 168 hours. Coagulation Profile: No results for input(s): INR, PROTIME in the last 168 hours. Cardiac Enzymes: No results for input(s): CKTOTAL, CKMB, CKMBINDEX, TROPONINI in the last 168 hours. BNP (last 3 results) No results for input(s): PROBNP in the last 8760 hours. HbA1C: No results for input(s): HGBA1C in the last 72 hours. CBG: Recent Labs  Lab 03/20/19 1918 03/20/19 2337 03/21/19 0302 03/21/19 0720 03/21/19 0722  GLUCAP 180* 172* 109* 184* 197*   Lipid Profile: No results for input(s): CHOL, HDL, LDLCALC, TRIG, CHOLHDL, LDLDIRECT in the last 72 hours. Thyroid Function Tests: Recent Labs    03/21/19 0044  TSH 4.156   Anemia Panel: No results for input(s): VITAMINB12, FOLATE, FERRITIN, TIBC, IRON, RETICCTPCT in the last 72 hours. Sepsis Labs: Recent Labs  Lab 03/15/19 0300  PROCALCITON <0.10    Recent Results (from the past 240 hour(s))  Culture, blood (routine x 2)     Status: None   Collection Time: 03/13/19 11:42 AM   Specimen: BLOOD LEFT WRIST  Result Value Ref Range Status  Specimen Description BLOOD LEFT WRIST  Final   Special Requests   Final    BOTTLES DRAWN AEROBIC ONLY Blood Culture adequate volume   Culture   Final    NO GROWTH 5 DAYS Performed at [facility], [address] [place], [place] [address]    Report Status 03/18/2019 FINAL  Final  Culture, blood (routine x 2)     Status: None   Collection Time: 03/13/19 11:42 AM   Specimen: BLOOD LEFT FOREARM  Result Value Ref Range Status   Specimen Description BLOOD LEFT FOREARM  Final   Special Requests   Final    BOTTLES DRAWN AEROBIC ONLY Blood Culture adequate volume   Culture   Final    NO GROWTH 5 DAYS Performed at [facility], [place] [address] [place], [place] [address]    Report Status 03/18/2019 FINAL  Final  Culture, respiratory (non-expectorated)     Status: None   Collection Time: 03/13/19  6:23 PM   Specimen: Tracheal Aspirate; Respiratory  Result Value Ref Range Status   Specimen Description TRACHEAL ASPIRATE  Final   Special Requests NONE  Final   Gram Stain   Final    MODERATE GRAM POSITIVE COCCI IN PAIRS AND CHAINS MODERATE GRAM POSITIVE RODS FEW GRAM NEGATIVE RODS FEW WBC PRESENT,BOTH PMN AND MONONUCLEAR RARE SQUAMOUS EPITHELIAL CELLS PRESENT    Culture   Final    Consistent with normal respiratory flora. Performed at [facility], [place] [address] [place], [place] [address]    Report Status 03/15/2019 FINAL  Final         Radiology Studies: MR BRAIN WO CONTRAST  Result Date: 03/19/2019 CLINICAL DATA:  Encephalopathy. Dysphagia. Acute hypoxic respiratory failure related to COVID-19 pneumonia with secondary thromboembolic events. EXAM: MRI HEAD WITHOUT CONTRAST TECHNIQUE: Multiplanar, multiecho pulse sequences of the brain and surrounding structures were obtained without intravenous contrast. COMPARISON:  Head CT February 16, 2019 FINDINGS: Brain: No acute infarction, hemorrhage, hydrocephalus, extra-axial collection or mass lesion. Subacute infarcts are noted scattered throughout the brain parenchyma including bilateral frontoparietal regions, splenium of the corpus callosum on the right side and most significantly left occipital lobe. There are also chronic bilateral cerebellar infarcts. Susceptibility artifact is noted on the susceptibility weighted images associated with these infarcts, consistent with hemosiderin deposit. Vascular: Normal flow voids. Skull and upper cervical spine: Normal marrow signal. Sinuses/Orbits: Mucosal thickening in the left maxillary sinus. Other: Bilateral mastoid effusion. IMPRESSION: 1. No acute intracranial abnormality identified. 2. Subacute infarcts scattered  throughout the brain parenchyma including bilateral frontoparietal regions, splenium of corpus callosum on the right side and most significantly left occipital lobe. 3. Chronic bilateral cerebellar infarcts. Electronically Signed   By: Pedro Earls M.D.   On: 03/19/2019 14:24        Scheduled Meds: . apixaban  5 mg Per Tube BID  . vitamin C  500 mg Per Tube Daily  . atorvastatin  20 mg Per Tube q1800  . chlorhexidine gluconate (MEDLINE KIT)  15 mL Mouth Rinse BID  . Chlorhexidine Gluconate Cloth  6 each Topical Daily  . collagenase   Topical Daily  . doxazosin  1 mg Per Tube Daily  . famotidine  20 mg Per Tube BID  . feeding supplement (PRO-STAT SUGAR FREE 64)  30 mL Per Tube TID  . folic acid  1 mg Per Tube Daily  . insulin aspart  0-20 Units Subcutaneous Q4H  . insulin detemir  30 Units Subcutaneous BID  . mouth rinse  15 mL Mouth Rinse 10 times per day  . multivitamin  15 mL Per Tube Daily  . nutrition supplement (JUVEN)  1 packet Per Tube BID BM  . polyethylene glycol  17 g Per Tube Daily  . senna-docusate  1 tablet Per Tube BID  . sodium chloride flush  10-40 mL Intracatheter Q12H  . thiamine  100 mg Per Tube Daily  . zinc sulfate  220 mg Per Tube Daily   Continuous Infusions: . sodium chloride Stopped (03/12/19 1606)  . feeding supplement (OSMOLITE 1.5 CAL) 1,000 mL (03/20/19 1744)     LOS: 43 days   Time spent= 35 mins    Quadir Muns Arsenio Loader, MD [facility]  If 7PM-7AM, please contact night-coverage  03/21/2019, 10:17 AM

## 2019-03-21 NOTE — Progress Notes (Signed)
NAME:  Andrew Bruce, MRN:  [id number], DOB:  Nov 03, 1965, LOS: 43 ADMISSION DATE:  02/06/2019, CONSULTATION DATE:  02/06/19 REFERRING MD:  ER, CHIEF COMPLAINT:  SOB   Brief History   [name] without a past medical history admitted 1/23 with 48 hours of worsening shortness of breath in the context of a 10-day Covid illness (dry cough, lost taste/smell).  Chest x-ray and vitals are consistent with a worsening COVID ARDS.  Admitted for evaluation, developed worsening shortness of breath and increasing O2 needs. Decompensated requiring intubation 1/24.  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   Past Medical History  None known prior to admit   Significant Hospital Events   1/23 Admit  1/24 Decompensated, tx to ICU, intubated 1/25 b/l chest tubes placed 1/25 CVC 2/03 flomax started 2/06 rt chest tube connection changed 2/09 Still no BM. Afebrile.  Left chest tube connection changed 2/10 Palliative care meeting > FULL CODE.     2/16 Trach 2/17 Chest tubes removed 2/18 witnessed aspiration 2/19 Cortrak dislodged / removed 2/21 Woke up /  Mental status improved 2/25 10L/40% ATC, up to chair   Consults:  Neurology  Palliative care  Procedures:  ETT 1/24 >> 2/16 Bilateral chest tube placement 1/25 >> 2/17 R Femoral TLC 1/25 >> 2/4 RUE PICC 2/4 >> Trach 2/16 >>   Significant Diagnostic Tests:  CT Head 2/2 >> no acute intracranial hemorrhage, large area of infarct in the left occipital lobe, likely subacute CTA Chest 2/3 >> LLL segmental PE, no CT evidence of right heart strain, multifocal PNA, background emphysema and probable degree of interstitial fibrosis ECHO 2/3 >> LVEF 55-60%, no WMA, hypokinetic right ventricular free wall, McConnells sign present. RV findings consistent with acute cor pulmonale, small pericardial effusion, mildly elevated pulmonary artery systolic pressure  Micro Data:  COVID 1/12 >>  positive  PCT 1/23 >> Neg Blood cx 2/5 >> negative Sputum 2/6 >> abundant diphtheroids Corynebacterium species  Sputum 2/18 >> normal flora  Antimicrobials:  Remdesivir 1/23>>1/28 Dexamethasone 1/23 >> 2/2  Vanc 2/5 >> 2/12  Zosyn 2/5 >> 2/9 Cefepime 2/9 >> 2/11 Unasyn 2/18 >> 2/23    Interim history/subjective:   Nods head to yes when asking yes/no questions.  States that he is feeling okay.  Objective   Blood pressure 112/85, pulse (!) 103, temperature 98.2 F (36.8 C), temperature source Axillary, resp. rate (!) 21, height 5\' 6"  (1.676 m), weight 69.8 kg, SpO2 100 %.    FiO2 (%):  [28 %-35 %] 35 %   Intake/Output Summary (Last 24 hours) at 03/21/2019 1440 Last data filed at 03/21/2019 0515 Gross per 24 hour  Intake --  Output 2400 ml  Net -2400 ml   Filed Weights   03/15/19 0306 03/16/19 0438 03/17/19 0451  Weight: 70.7 kg 70.8 kg 69.8 kg   GEN: Middle-aged male resting in bed no distress follows commands HEENT: 6 cuffless [place] no secretions CV: S1-S2, regular rate rhythm PULM: Bilateral rhonchi GI: PEG CDI EXT: Gangrenous toes lower extremities  Labs reviewed: White blood cell count 9.6, hemoglobin 10.8, serum creatinine 0.39  Resolved Hospital Problem list   Oliguria, hypernatremia Bilateral pneumothoraces Ventilator associated pneumonia  Assessment & Plan:   Acute hypoxemic respiratory failure due to COVID-19 pneumonia, ARDS, PE, left lower lobe pneumonia, improving  Tracheostomy Dependence  Profound deconditioning -6 cuffless in place -Continue trach collar trial -Continue to encourage PMV use -Continue pulmonary toileting CPT DuoNebs -Pulmonary will follow for  trach care. -Can consider downsizing soon. -Continue need for rehabilitation.  Pulmonary will follow 1-2 times per week for trach care.  [facility] 03/21/2019 2:42 PM

## 2019-03-22 LAB — GLUCOSE, CAPILLARY
Glucose-Capillary: 119 mg/dL — ABNORMAL HIGH (ref 70–99)
Glucose-Capillary: 176 mg/dL — ABNORMAL HIGH (ref 70–99)
Glucose-Capillary: 123 mg/dL — ABNORMAL HIGH (ref 70–99)
Glucose-Capillary: 193 mg/dL — ABNORMAL HIGH (ref 70–99)
Glucose-Capillary: 156 mg/dL — ABNORMAL HIGH (ref 70–99)
Glucose-Capillary: 121 mg/dL — ABNORMAL HIGH (ref 70–99)

## 2019-03-22 LAB — BASIC METABOLIC PANEL
Anion gap: 9 (ref 5–15)
BUN: 23 mg/dL — ABNORMAL HIGH (ref 6–20)
CO2: 28 mmol/L (ref 22–32)
Calcium: 8.7 mg/dL — ABNORMAL LOW (ref 8.9–10.3)
Chloride: 100 mmol/L (ref 98–111)
Creatinine, Ser: 0.33 mg/dL — ABNORMAL LOW (ref 0.61–1.24)
GFR calc Af Amer: >60 mL/min (ref >60)
GFR calc non Af Amer: >60 mL/min (ref >60)
Glucose, Bld: 146 mg/dL — ABNORMAL HIGH (ref 70–99)
Potassium: 4 mmol/L (ref 3.5–5.1)
Sodium: 137 mmol/L (ref 135–145)

## 2019-03-22 LAB — CBC
HCT: 34.2 % — ABNORMAL LOW (ref 39.0–52.0)
Hemoglobin: 10.4 g/dL — ABNORMAL LOW (ref 13.0–17.0)
MCH: 26.3 pg (ref 26.0–34.0)
MCHC: 30.4 g/dL (ref 30.0–36.0)
MCV: 86.6 fL (ref 80.0–100.0)
Platelets: 472 10*3/uL — ABNORMAL HIGH (ref 150–400)
RBC: 3.95 MIL/uL — ABNORMAL LOW (ref 4.22–5.81)
RDW: 17 % — ABNORMAL HIGH (ref 11.5–15.5)
WBC: 10.9 10*3/uL — ABNORMAL HIGH (ref 4.0–10.5)
nRBC: 0 % (ref 0.0–0.2)

## 2019-03-22 LAB — MAGNESIUM: Magnesium: 2.2 mg/dL (ref 1.7–2.4)

## 2019-03-22 NOTE — Progress Notes (Signed)
Inpatient Rehab Admissions Coordinator:    Note pt on 35% trach collar.  Still requiring suctioning every 2 hours.  CIR still following for timing of admission pending improvement.   Estill Dooms, PT, DPT Admissions Coordinator [phone] 03/22/19  12:58 PM

## 2019-03-22 NOTE — Progress Notes (Signed)
Occupational Therapy Treatment Patient Details Name: Andrew Bruce MRN: [id number] DOB: 1965/07/10 Today's Date: 03/22/2019    History of present illness Pt is a 54 y.o. Spanish-speaking male admitted 02/06/19 with worsening SOB with a 10-day h/o COVID-19 ilness. CXR consistent with COVID ARDS. Worsening respiratory status with decompensation requiring intubation 1/24. 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 S/p trach placement 2/16. Liberated from vent to trach collar 2/25. S/p PEG tube. Pt has also developed gangrene of bilateral toes. No significant PMH.   OT comments  Pt readily willing to work with therapies. Used video interpreter, but pt able to understand English and gestures. Transferred to chair with maximove and then worked on grooming and sit to stand from chair with stedy. Pt with c/o sore buttocks, returned to bed with stedy. Pt needing +2 mod assist to stand. Progressing steadily.  Follow Up Recommendations  CIR;Supervision/Assistance - 24 hour    Equipment Recommendations  Other (comment)(defer to next venue)    Recommendations for Other Services      Precautions / Restrictions Precautions Precautions: Fall Precaution Comments: 35% trach collar, PEG; orthostatic hypotension       Mobility Bed Mobility Overal bed mobility: Needs Assistance Bed Mobility: Rolling Rolling: Min guard     Sit to supine: +2 for physical assistance;Min assist   General bed mobility comments: Assist to bring feet back up into bed and to lower trunk  Transfers Overall transfer level: Needs assistance Equipment used: Ambulation equipment used Transfers: Sit to/from Stand Sit to Stand: +2 physical assistance;Mod assist         General transfer comment: Used maximove to transfer bed to recliner. From recliner stood with Stedy x 2 with +2 mod assist and use of bed pad to raise hips. Pt too uncomfortable in chair due to wounds to remain sitting. Used  Stedy for back to bed.     Balance Overall balance assessment: Needs assistance Sitting-balance support: Feet supported;Single extremity supported Sitting balance-Leahy Scale: Poor Sitting balance - Comments: UE support and min guard to min assist for static sitting Postural control: Posterior lean Standing balance support: Bilateral upper extremity supported Standing balance-Leahy Scale: Poor Standing balance comment: Stood x 4 with Stedy for 5-20 sec each time with +2 min/mod assist. Pt with hyperextended knees and hanging on hips                           ADL either performed or assessed with clinical judgement   ADL Overall ADL's : Needs assistance/impaired     Grooming: Oral care;Wash/dry hands;Sitting;Minimal assistance               Lower Body Dressing: Total assistance;Bed level Lower Body Dressing Details (indicate cue type and reason): donning socks                     Vision       Perception     Praxis      Cognition Arousal/Alertness: Awake/alert Behavior During Therapy: Flat affect Overall Cognitive Status: Difficult to assess                         Following Commands: Follows one step commands consistently                Exercises General Exercises - Lower Extremity Ankle Circles/Pumps: AROM;10 reps;Seated   Shoulder Instructions       General Comments Video  interpreter # [phone] used    Pertinent Vitals/ Pain       Pain Assessment: Faces Faces Pain Scale: Hurts even more Pain Location: buttocks, sacrum Pain Descriptors / Indicators: Grimacing;Restless Pain Intervention(s): Monitored during session;Repositioned  Home Living                                          Prior Functioning/Environment              Frequency  Min 2X/week        Progress Toward Goals  OT Goals(current goals can now be found in the care plan section)  Progress towards OT goals: Progressing toward  goals  Acute Rehab OT Goals Patient Stated Goal: not stated, motivated to participate OT Goal Formulation: With patient Time For Goal Achievement: 03/27/19 Potential to Achieve Goals: Good  Plan Discharge plan remains appropriate    Co-evaluation    PT/OT/SLP Co-Evaluation/Treatment: Yes Reason for Co-Treatment: For patient/therapist safety;Complexity of the patient's impairments (multi-system involvement) PT goals addressed during session: Mobility/safety with mobility;Balance OT goals addressed during session: ADL's and self-care;Strengthening/ROM      AM-PAC OT "6 Clicks" Daily Activity     Outcome Measure   Help from another person eating meals?: Total Help from another person taking care of personal grooming?: A Little Help from another person toileting, which includes using toliet, bedpan, or urinal?: Total Help from another person bathing (including washing, rinsing, drying)?: A Lot Help from another person to put on and taking off regular upper body clothing?: A Lot Help from another person to put on and taking off regular lower body clothing?: Total 6 Click Score: 10    End of Session Equipment Utilized During Treatment: Oxygen  OT Visit Diagnosis: Hemiplegia and hemiparesis;Other symptoms and signs involving cognitive function;Other abnormalities of gait and mobility (R26.89);Other symptoms and signs involving the nervous system (R29.898) Hemiplegia - Right/Left: Right Hemiplegia - dominant/non-dominant: Dominant Hemiplegia - caused by: Cerebral infarction   Activity Tolerance Patient tolerated treatment well   Patient Left in bed;with call bell/phone within reach;with bed alarm set   Nurse Communication Mobility status        Time: 6433-2951 OT Time Calculation (min): 56 min  Charges: OT General Charges $OT Visit: 1 Visit OT Treatments $Self Care/Home Management : 8-22 mins $Therapeutic Activity: 8-22 mins  Andrew Bruce, OTR/L Acute Rehabilitation  Services Pager: [phone] Office: [phone]   Andrew Bruce 03/22/2019, 1:54 PM

## 2019-03-22 NOTE — Progress Notes (Signed)
Physical Therapy Wound Treatment Patient Details  Name: Andrew Bruce MRN: [id number] Date of Birth: March 29, 1965  Today's Date: 03/22/2019 Time: 1001-1027 Time Calculation (min): 26 min  Subjective  Subjective: Pt with no attempts to speak despite use of Spanish.  He would nod yes or no.  Spero Geralds did interpret that we would be doing the procedure and what this would entail.  He nodded that he understood. Patient and Family Stated Goals: None stated  Pain Score:  6/10 FACES  Wound Assessment  Pressure Injury 02/22/19 Coccyx Medial Unstageable - Full thickness tissue loss in which the base of the injury is covered by slough (yellow, tan, gray, green or brown) and/or eschar (tan, brown or black) in the wound bed. quarter-sized/purple  (Active)  Dressing Type Foam - Lift dressing to assess site every shift;Barrier Film (skin prep);Moist to dry 03/22/19 1341  Dressing Changed 03/22/19 1341  Dressing Change Frequency Daily 03/22/19 1341  State of Healing Other (Comment) 03/22/19 1341  Site / Wound Assessment Yellow;Pink 03/22/19 1341  % Wound base Red or Granulating 15% 03/22/19 1341  % Wound base Yellow/Fibrinous Exudate 85% 03/22/19 1341  % Wound base Black/Eschar 0% 03/22/19 1341  Peri-wound Assessment Intact;Pink 03/22/19 1341  Wound Length (cm) 4.5 cm 03/17/19 1146  Wound Width (cm) 2.5 cm 03/17/19 1146  Wound Depth (cm) 0.1 cm 03/17/19 1146  Wound Surface Area (cm^2) 11.25 cm^2 03/17/19 1146  Wound Volume (cm^3) 1.12 cm^3 03/17/19 1146  Tunneling (cm) 0 03/13/19 1843  Undermining (cm) 0 03/13/19 1843  Margins Unattached edges (unapproximated) 03/22/19 1341  Drainage Amount Minimal 03/22/19 1341  Drainage Description No odor 03/22/19 1341  Treatment Debridement (Selective);Hydrotherapy (Pulse lavage);Packing (Saline gauze) 03/22/19 1341     Santyl applied to wound bed prior to applying dressing.      Wound Assessment and Plan  Wound Therapy - Assess/Plan/Recommendations Wound  Therapy - Clinical Statement: Pt remains with adherent necrotic slough.  PTA able to remove scant necrosis and score the remaining necrosis before applying santyl to the wound bed.  He remains to benefit from continued hydro therapy to remove necrotic tissue and promote wound bed healing to decrease necrosis and bioburden.  Continue to follow at this time. Wound Therapy - Functional Problem List: Global weakness s/p COVID, CVA's, aortic clot Factors Delaying/Impairing Wound Healing: Multiple medical problems;Immobility Hydrotherapy Plan: Debridement;Dressing change;Patient/family education;Pulsatile lavage with suction Wound Therapy - Frequency: 6X / week Wound Therapy - Follow Up Recommendations: Skilled nursing facility Wound Plan: See above  Wound Therapy Goals- Improve the function of patient's integumentary system by progressing the wound(s) through the phases of wound healing (inflammation - proliferation - remodeling) by: Decrease Necrotic Tissue - Progress: Progressing toward goal Increase Granulation Tissue to: 100 Increase Granulation Tissue - Progress: Progressing toward goal Time For Goal Achievement: 7 days Wound Therapy - Potential for Goals: Good  Goals will be updated until maximal potential achieved or discharge criteria met.  Discharge criteria: when goals achieved, discharge from hospital, MD decision/surgical intervention, no progress towards goals, refusal/missing three consecutive treatments without notification or medical reason.  GP     Carson Meche Eli Hose 03/22/2019, 1:45 PM Erasmo Leventhal , PTA Acute Rehabilitation Services Pager [phone] Office [phone]

## 2019-03-22 NOTE — Clinical Social Work Note (Signed)
CSW received call from [facility] stating that MD is requesting LTACH referral.   CSW spoke with Sarah with [facility] to make referral. Unfortunately, at this time they do not accept patient's without a payor source.   CSW made call to [place] with [facility]. She states states that due to patient not having a payor source the referral has to come directly from [facility], [facility] Director. CSW left message for Zack and awaiting return call. Leta Baptist states that after the referral has come from [place] it will have to be reviewed by Field seismologist.   TOC will continue to follow.

## 2019-03-22 NOTE — Progress Notes (Signed)
  Speech Language Pathology Treatment: Dysphagia;Passy Muir Speaking valve  Patient Details Name: Andrew Bruce MRN: [id number] DOB: Sep 19, 1965 Today's Date: 03/22/2019 Time: 4765-4650 SLP Time Calculation (min) (ACUTE ONLY): 25 min  Assessment / Plan / Recommendation Clinical Impression  Pt alert, very participatory, able to complete 30 reps EMST at 20cm H20, 30 effortful swallows with ice, 30 modified Shaker exercises. No secretions observed wore PMSV for 20 minute with adequate redirection of air, improving breath support, still aphonic. Results of MRI show more extensive infarcts than documented on CT "Subacute infarcts are noted scattered throughout the brain parenchyma including bilateral frontoparietal regions, splenium of the corpus callosum on the right side and most significantly left occipital lobe. There are also chronic bilateral cerebellar infarcts." Areas of infarct in some studies that have been linked to decreased laryngeal closure and pharyngeal residue. Will Plan for a FEES tomorrow for instrumental check on pts swallowing ability given significant improvement in secretion management and excellent tolerance of ice subjectively at bedside seen today. Can also visualize vocal folds to better determine etiology of aphonia. (Asked RN to replace suction cannister, appearance is deceptive as it is full, but is also weeks old).   HPI HPI: [name] without a past medical history admitted 1/23 with 48 hours of worsening shortness of breath in the context of a 10-day Covid illness (dry cough, lost taste/smell). Tested positive for Covid 01/26/19. Decompensated requiring intubation 1/24 and trach'd 2/16. 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 Additional smaller right frontal convexity infarct also noted, bilateral pneumothoraces & aortic clot. Pt has developed gangrene  of toes as well.        SLP Plan          Recommendations  Diet recommendations: NPO                Follow up Recommendations: 24 hour supervision/assistance;Inpatient Rehab SLP Visit Diagnosis: Dysphagia, pharyngoesophageal phase (R13.14)       GO               Andrew Ditty, MA CCC-SLP  Acute Rehabilitation Services Pager [phone] Office [phone]  Andrew Bruce 03/22/2019, 11:24 AM

## 2019-03-22 NOTE — Progress Notes (Signed)
PROGRESS NOTE    Andrew Bruce  OEV:[id number] DOB: [birth year] DOA: 02/06/2019 PCP: Patient, No Pcp Per   Brief Narrative:  54 year old initially admitted to the hospital for acute hypoxic respiratory failure secondary to COVID-19 pneumonia followed by prolonged hospitalization and complications.  Ended up developing multiple arterial/venous thromboembolic complications including aortic thrombus, CVA, pulmonary embolism and ischemic toes.  Due to pneumothoraces he required bilateral chest tubes eventually underwent tracheostomy placement 2/16 and removal of chest tubes on 2/17.  Currently also has PEG in place.  Overall hemodynamically stable but persistent significant tracheal secretions and leukocytosis.   Assessment & Plan:   Active Problems:   COVID-19   Acute respiratory failure with hypoxia (HCC)   Pneumothorax, right   Cerebral embolism with cerebral infarction   ARDS (adult respiratory distress syndrome) (HCC)   On mechanically assisted ventilation (HCC)   Goals of care, counseling/discussion   Palliative care encounter   Pressure injury of skin   Status post tracheostomy ([place])  Acute hypoxic respiratory failure secondary to COVID-19 pneumonia progressed to ARDS complicated by bilateral pneumothoraces Tracheostomy in place -Copious amount of secretions, aggressive pulmonary toilet and clearing techniques. -Frequent deep suctioning by respiratory therapy -Out of bed to chair as tolerated.  Keep head level greater than 30 degree angle -Bronchodilator therapy. -Appreciate input from pulmonary.  Scopolamine and Robinul stopped. Possible down sizing of his trach next week.  -Attempting to use PMV but unclear phonation. -Frequent suctioning as much as every 2 hours.  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    Uncontrolled diabetes mellitus type 2, poorly controlled due to hyperglycemia -Hemoglobin A1c 11.7. -Continue Lantus 30 units twice daily Sliding scale  Hyperlipidemia -Statin  Unstageable ulcer on coccyx, not present on admission Bilateral ear skin ulcer, stage I right ear, stage II left ear -Local wound care -Seen by wound care team.  Appreciate their recommendations.  Dysphagia with PEG tube in place.  Continue tube feeds at 65 cc/h.  MRI shows multifocal areas of infarct secondary to CVA.  Speech therapy will continue to work with him.  Previously seen by palliative care team for goals of care discussion-family would like to pursue full code at this time.  PT/OT-plans for CIR  DVT prophylaxis: Eliquis Code Status: Full code Family Communication: No answer but daughter Disposition Plan:   Patient From= home  Patient Anticipated D/C place= CIR  Barriers= maintain hospital stay as he is requiring frequent suctioning due to secretions from tracheostomy site.  Transition to CIR once this is improved  Consultants:   Pulmonary   Antimicrobials:  Remdesivir 1/23>>1/28 Dexamethasone 1/23 >> 2/2  Vanc 2/5 >> 2/12  Zosyn 2/5 >> 2/9 Cefepime 2/9 >> 2/11 Unasyn 2/18 >> 2/23     Subjective: Continues to have copious amount of secretions from his tracheostomy site which is mainly whitish in color.  Review of Systems Otherwise negative except as per HPI, including: ,General = no fevers, chills, dizziness,  fatigue HEENT/EYES = negative for loss of vision, double vision, blurred vision,  sore throa Cardiovascular= negative for chest pain, palpitation Respiratory/lungs= negative for shortness of breath, cough, wheezing; hemoptysis,  Gastrointestinal= negative for nausea, vomiting, abdominal pain Genitourinary= negative for Dysuria MSK = Negative for arthralgia, myalgias Neurology= Negative for headache, numbness, tingling  Psychiatry= Negative for suicidal and  homocidal ideation Skin= Negative for Rash   Examination: Constitutional: Not in  acute distress Respiratory: Diffuse rhonchorous breath sounds anteriorly Cardiovascular: Normal sinus rhythm, no rubs Abdomen: Nontender nondistended good bowel sounds Musculoskeletal: No edema noted Skin: No rashes seen Neurologic: CN 2-12 grossly intact.  And nonfocal Psychiatric: Normal judgment and insight. Alert and oriented x 3. Normal mood.    Tracheostomy tube in place PEG tube in place  Objective: Vitals:   03/21/19 2314 03/22/19 0345 03/22/19 0750 03/22/19 0800  BP:    112/79  Pulse:    (!) 101  Resp:    20  Temp: 98 F (36.7 C)   98.3 F (36.8 C)  TempSrc: Oral   Oral  SpO2:  99% 96% 96%  Weight:      Height:        Intake/Output Summary (Last 24 hours) at 03/22/2019 1125 Last data filed at 03/21/2019 2000 Gross per 24 hour  Intake 60 ml  Output --  Net 60 ml   Filed Weights   03/15/19 0306 03/16/19 0438 03/17/19 0451  Weight: 70.7 kg 70.8 kg 69.8 kg     Data Reviewed:   CBC: Recent Labs  Lab 03/16/19 0437 03/17/19 0513 03/20/19 0539 03/21/19 0044 03/22/19 0235  WBC 21.9* 14.4* 9.6 9.6 10.9*  NEUTROABS 19.0* 11.4*  --   --   --   HGB 9.7* 9.7* 10.5* 10.8* 10.4*  HCT 32.6* 33.3* 35.0* 36.1* 34.2*  MCV 89.6 91.5 87.7 87.8 86.6  PLT 511* 491* 477* 459* 945*   Basic Metabolic Panel: Recent Labs  Lab 03/18/19 0427 03/19/19 0617 03/20/19 0539 03/21/19 0044 03/22/19 0235  NA 146* 143 140 138 137  K 3.6 3.6 3.7 4.0 4.0  CL 107 106 102 101 100  CO2 _0 GLUCOSE 171* 100* 121* 150* 146*  BUN 24* 24* 25* 28* 23*  CREATININE 0.37* 0.36* 0.39* 0.39* 0.33*  CALCIUM 8.7* 8.7* 8.7* 8.6* 8.7*  MG 2.2 2.2 2.3 2.2 2.2   GFR: Estimated Creatinine Clearance: 97.5 mL/min (A) (by C-G formula based on SCr of 0.33 mg/dL (L)). Liver Function Tests: Recent Labs  Lab 03/18/19 0427 03/19/19 0617  AST 42* 43*  ALT 93* 100*  ALKPHOS 101 103  BILITOT 0.4 0.5   PROT 6.5 6.9  ALBUMIN 2.2* 2.3*   No results for input(s): LIPASE, AMYLASE in the last 168 hours. No results for input(s): AMMONIA in the last 168 hours. Coagulation Profile: No results for input(s): INR, PROTIME in the last 168 hours. Cardiac Enzymes: No results for input(s): CKTOTAL, CKMB, CKMBINDEX, TROPONINI in the last 168 hours. BNP (last 3 results) No results for input(s): PROBNP in the last 8760 hours. HbA1C: No results for input(s): HGBA1C in the last 72 hours. CBG: Recent Labs  Lab 03/21/19 1643 03/21/19 1911 03/21/19 2313 03/22/19 0405 03/22/19 0758  GLUCAP 173* 150* 125* 119* 176*   Lipid Profile: No results for input(s): CHOL, HDL, LDLCALC, TRIG, CHOLHDL, LDLDIRECT in the last 72 hours. Thyroid Function Tests: Recent Labs    03/21/19 0044  TSH 4.156   Anemia Panel: No results for input(s): VITAMINB12, FOLATE, FERRITIN, TIBC, IRON, RETICCTPCT in the last 72 hours. Sepsis Labs: No results for input(s): PROCALCITON, LATICACIDVEN in the last 168 hours.  Recent Results (from the past 240 hour(s))  Culture, blood (routine x 2)     Status: None   Collection Time: 03/13/19 11:42 AM   Specimen: BLOOD LEFT WRIST  Result Value Ref Range Status   Specimen Description BLOOD LEFT WRIST  Final   Special  Requests   Final    BOTTLES DRAWN AEROBIC ONLY Blood Culture adequate volume   Culture   Final    NO GROWTH 5 DAYS Performed at [facility], [place] [address] [place], [place] [address]    Report Status 03/18/2019 FINAL  Final  Culture, blood (routine x 2)     Status: None   Collection Time: 03/13/19 11:42 AM   Specimen: BLOOD LEFT FOREARM  Result Value Ref Range Status   Specimen Description BLOOD LEFT FOREARM  Final   Special Requests   Final    BOTTLES DRAWN AEROBIC ONLY Blood Culture adequate volume   Culture   Final    NO GROWTH 5 DAYS Performed at [facility], [place] [address], [place], [place] [address]    Report Status 03/18/2019 FINAL   Final  Culture, respiratory (non-expectorated)     Status: None   Collection Time: 03/13/19  6:23 PM   Specimen: Tracheal Aspirate; Respiratory  Result Value Ref Range Status   Specimen Description TRACHEAL ASPIRATE  Final   Special Requests NONE  Final   Gram Stain   Final    MODERATE GRAM POSITIVE COCCI IN PAIRS AND CHAINS MODERATE GRAM POSITIVE RODS FEW GRAM NEGATIVE RODS FEW WBC PRESENT,BOTH PMN AND MONONUCLEAR RARE SQUAMOUS EPITHELIAL CELLS PRESENT    Culture   Final    Consistent with normal respiratory flora. Performed at [facility], [place] [address], [place],  [address]    Report Status 03/15/2019 FINAL  Final         Radiology Studies: No results found.      Scheduled Meds: . apixaban  5 mg Per Tube BID  . vitamin C  500 mg Per Tube Daily  . atorvastatin  20 mg Per Tube q1800  . chlorhexidine gluconate (MEDLINE KIT)  15 mL Mouth Rinse BID  . Chlorhexidine Gluconate Cloth  6 each Topical Daily  . collagenase   Topical Daily  . doxazosin  1 mg Per Tube Daily  . famotidine  20 mg Per Tube BID  . feeding supplement (PRO-STAT SUGAR FREE 64)  30 mL Per Tube TID  . folic acid  1 mg Per Tube Daily  . insulin aspart  0-20 Units Subcutaneous Q4H  . insulin detemir  30 Units Subcutaneous BID  . mouth rinse  15 mL Mouth Rinse 10 times per day  . multivitamin  15 mL Per Tube Daily  . nutrition supplement (JUVEN)  1 packet Per Tube BID BM  . polyethylene glycol  17 g Per Tube Daily  . senna-docusate  1 tablet Per Tube BID  . sodium chloride flush  10-40 mL Intracatheter Q12H  . thiamine  100 mg Per Tube Daily  . zinc sulfate  220 mg Per Tube Daily   Continuous Infusions: . sodium chloride Stopped (03/12/19 1606)  . feeding supplement (OSMOLITE 1.5 CAL) 1,000 mL (03/20/19 1744)     LOS: 44 days   Time spent= 35 mins    Donica Derouin Arsenio Loader, MD [facility]  If 7PM-7AM, please contact night-coverage  03/22/2019, 11:25 AM

## 2019-03-22 NOTE — Progress Notes (Signed)
Physical Therapy Treatment Patient Details Name: Andrew Bruce MRN: [id number] DOB: 09-24-1965 Today's Date: 03/22/2019    History of Present Illness Pt is a 54 y.o. Spanish-speaking male admitted 02/06/19 with worsening SOB with a 10-day h/o COVID-19 ilness. CXR consistent with COVID ARDS. Worsening respiratory status with decompensation requiring intubation 1/24. 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 S/p trach placement 2/16. Liberated from vent to trach collar 2/25. S/p PEG tube. Pt has also developed gangrene of bilateral toes. No significant PMH.    PT Comments    Pt making excellent progress. Able to stand today with Stedy.    Follow Up Recommendations  CIR;Supervision/Assistance - 24 hour     Equipment Recommendations  Other (comment)(defer to next venue)    Recommendations for Other Services       Precautions / Restrictions Precautions Precautions: Fall;Other (comment) Precaution Comments: 35% trach collar, PEG; orthostatic hypotension    Mobility  Bed Mobility Overal bed mobility: Needs Assistance Bed Mobility: Rolling Rolling: Min guard     Sit to supine: +2 for physical assistance;Min assist   General bed mobility comments: Assist to bring feet back up into bed and to lower trunk  Transfers Overall transfer level: Needs assistance Equipment used: Ambulation equipment used Transfers: Sit to/from Stand Sit to Stand: +2 physical assistance;Mod assist         General transfer comment: Used maximove to transfer bed to recliner. From recliner stood with Stedy x 2 with +2 mod assist and use of bed pad to raise hips. Pt too uncomfortable in chair due to wounds to remain sitting. Used Stedy for back to bed.   Ambulation/Gait                 Stairs             Wheelchair Mobility    Modified Rankin (Stroke Patients Only) Modified Rankin (Stroke Patients Only) Pre-Morbid Rankin Score: No significant  disability Modified Rankin: Severe disability     Balance Overall balance assessment: Needs assistance Sitting-balance support: Feet supported;Single extremity supported Sitting balance-Leahy Scale: Poor Sitting balance - Comments: UE support and min guard to min assist for static sitting Postural control: Posterior lean Standing balance support: Bilateral upper extremity supported Standing balance-Leahy Scale: Poor Standing balance comment: Stood x 4 with Stedy for 5-20 sec each time with +2 min/mod assist. Pt with hyperextended knees and hanging on hips                            Cognition Arousal/Alertness: Awake/alert Behavior During Therapy: Flat affect Overall Cognitive Status: Difficult to assess                         Following Commands: Follows one step commands consistently              Exercises General Exercises - Lower Extremity Ankle Circles/Pumps: AROM;10 reps;Seated    General Comments General comments (skin integrity, edema, etc.): Video interpreter # [id number] used      Pertinent Vitals/Pain Pain Assessment: Faces Faces Pain Scale: Hurts even more Pain Location: buttocks, sacrum Pain Descriptors / Indicators: Grimacing;Restless Pain Intervention(s): Limited activity within patient's tolerance;Monitored during session;Repositioned    Home Living                      Prior Function            PT  Goals (current goals can now be found in the care plan section) Acute Rehab PT Goals Patient Stated Goal: not stated, motivated to participate Progress towards PT goals: Progressing toward goals    Frequency    Min 3X/week      PT Plan Current plan remains appropriate    Co-evaluation PT/OT/SLP Co-Evaluation/Treatment: Yes Reason for Co-Treatment: Complexity of the patient's impairments (multi-system involvement);For patient/therapist safety PT goals addressed during session: Mobility/safety with mobility;Balance         AM-PAC PT "6 Clicks" Mobility   Outcome Measure  Help needed turning from your back to your side while in a flat bed without using bedrails?: A Little Help needed moving from lying on your back to sitting on the side of a flat bed without using bedrails?: A Lot Help needed moving to and from a bed to a chair (including a wheelchair)?: Total Help needed standing up from a chair using your arms (e.g., wheelchair or bedside chair)?: A Lot Help needed to walk in hospital room?: Total Help needed climbing 3-5 steps with a railing? : Total 6 Click Score: 10    End of Session Equipment Utilized During Treatment: Oxygen Activity Tolerance: Patient tolerated treatment well Patient left: in bed;with call bell/phone within reach Nurse Communication: Mobility status;Need for lift equipment PT Visit Diagnosis: Muscle weakness (generalized) (M62.81)     Time: 5573-2202 PT Time Calculation (min) (ACUTE ONLY): 56 min  Charges:  $Therapeutic Activity: 23-37 mins                     [facility] PT Acute Rehabilitation Services Pager [phone] Office [phone]    Angelina Ok [facility] 03/22/2019, 1:30 PM

## 2019-03-23 ENCOUNTER — Inpatient Hospital Stay (HOSPITAL_COMMUNITY)
Admission: RE | Admit: 2019-03-23 | Discharge: 2019-04-20 | DRG: 057 | Disposition: A | Discharge status: Home Health | Payer: Medicaid Other | Source: Intra-hospital | Attending: Physical Medicine and Rehabilitation | Admitting: Physical Medicine and Rehabilitation

## 2019-03-23 ENCOUNTER — Encounter (HOSPITAL_COMMUNITY): Payer: Self-pay | Admitting: Internal Medicine

## 2019-03-23 ENCOUNTER — Encounter (HOSPITAL_COMMUNITY): Payer: Self-pay | Admitting: Physical Medicine and Rehabilitation

## 2019-03-23 DIAGNOSIS — I6310 Cerebral infarction due to embolism of unspecified precerebral artery: Secondary | ICD-10-CM | POA: Diagnosis not present

## 2019-03-23 DIAGNOSIS — I639 Cerebral infarction, unspecified: Secondary | ICD-10-CM | POA: Diagnosis present

## 2019-03-23 DIAGNOSIS — B948 Sequelae of other specified infectious and parasitic diseases: Secondary | ICD-10-CM | POA: Diagnosis not present

## 2019-03-23 DIAGNOSIS — D62 Acute posthemorrhagic anemia: Secondary | ICD-10-CM | POA: Diagnosis not present

## 2019-03-23 DIAGNOSIS — E876 Hypokalemia: Secondary | ICD-10-CM | POA: Diagnosis not present

## 2019-03-23 DIAGNOSIS — I96 Gangrene, not elsewhere classified: Secondary | ICD-10-CM

## 2019-03-23 DIAGNOSIS — Z79899 Other long term (current) drug therapy: Secondary | ICD-10-CM | POA: Diagnosis not present

## 2019-03-23 DIAGNOSIS — J3801 Paralysis of vocal cords and larynx, unilateral: Secondary | ICD-10-CM | POA: Diagnosis present

## 2019-03-23 DIAGNOSIS — Z8616 Personal history of COVID-19: Secondary | ICD-10-CM | POA: Diagnosis not present

## 2019-03-23 DIAGNOSIS — J9601 Acute respiratory failure with hypoxia: Secondary | ICD-10-CM | POA: Diagnosis not present

## 2019-03-23 DIAGNOSIS — J3800 Paralysis of vocal cords and larynx, unspecified: Secondary | ICD-10-CM

## 2019-03-23 DIAGNOSIS — E162 Hypoglycemia, unspecified: Secondary | ICD-10-CM | POA: Diagnosis not present

## 2019-03-23 DIAGNOSIS — D72825 Bandemia: Secondary | ICD-10-CM | POA: Diagnosis not present

## 2019-03-23 DIAGNOSIS — E46 Unspecified protein-calorie malnutrition: Secondary | ICD-10-CM | POA: Diagnosis not present

## 2019-03-23 DIAGNOSIS — Z794 Long term (current) use of insulin: Secondary | ICD-10-CM

## 2019-03-23 DIAGNOSIS — E11649 Type 2 diabetes mellitus with hypoglycemia without coma: Secondary | ICD-10-CM | POA: Diagnosis not present

## 2019-03-23 DIAGNOSIS — L89150 Pressure ulcer of sacral region, unstageable: Secondary | ICD-10-CM | POA: Diagnosis present

## 2019-03-23 DIAGNOSIS — R1310 Dysphagia, unspecified: Secondary | ICD-10-CM | POA: Diagnosis present

## 2019-03-23 DIAGNOSIS — Z931 Gastrostomy status: Secondary | ICD-10-CM | POA: Diagnosis not present

## 2019-03-23 DIAGNOSIS — Z7901 Long term (current) use of anticoagulants: Secondary | ICD-10-CM

## 2019-03-23 DIAGNOSIS — E1152 Type 2 diabetes mellitus with diabetic peripheral angiopathy with gangrene: Secondary | ICD-10-CM | POA: Diagnosis present

## 2019-03-23 DIAGNOSIS — Z930 Tracheostomy status: Secondary | ICD-10-CM

## 2019-03-23 DIAGNOSIS — R0989 Other specified symptoms and signs involving the circulatory and respiratory systems: Secondary | ICD-10-CM | POA: Diagnosis not present

## 2019-03-23 DIAGNOSIS — E1165 Type 2 diabetes mellitus with hyperglycemia: Secondary | ICD-10-CM | POA: Diagnosis not present

## 2019-03-23 DIAGNOSIS — L8990 Pressure ulcer of unspecified site, unspecified stage: Secondary | ICD-10-CM | POA: Diagnosis present

## 2019-03-23 DIAGNOSIS — Z23 Encounter for immunization: Secondary | ICD-10-CM

## 2019-03-23 DIAGNOSIS — I6340 Cerebral infarction due to embolism of unspecified cerebral artery: Secondary | ICD-10-CM | POA: Diagnosis not present

## 2019-03-23 DIAGNOSIS — I69398 Other sequelae of cerebral infarction: Principal | ICD-10-CM

## 2019-03-23 DIAGNOSIS — U071 COVID-19: Secondary | ICD-10-CM | POA: Diagnosis present

## 2019-03-23 DIAGNOSIS — R5381 Other malaise: Secondary | ICD-10-CM

## 2019-03-23 DIAGNOSIS — J3802 Paralysis of vocal cords and larynx, bilateral: Secondary | ICD-10-CM | POA: Diagnosis not present

## 2019-03-23 DIAGNOSIS — R7309 Other abnormal glucose: Secondary | ICD-10-CM

## 2019-03-23 DIAGNOSIS — D72829 Elevated white blood cell count, unspecified: Secondary | ICD-10-CM

## 2019-03-23 DIAGNOSIS — L89304 Pressure ulcer of unspecified buttock, stage 4: Secondary | ICD-10-CM | POA: Diagnosis not present

## 2019-03-23 LAB — GLUCOSE, CAPILLARY
Glucose-Capillary: 150 mg/dL — ABNORMAL HIGH (ref 70–99)
Glucose-Capillary: 138 mg/dL — ABNORMAL HIGH (ref 70–99)
Glucose-Capillary: 148 mg/dL — ABNORMAL HIGH (ref 70–99)
Glucose-Capillary: 180 mg/dL — ABNORMAL HIGH (ref 70–99)
Glucose-Capillary: 121 mg/dL — ABNORMAL HIGH (ref 70–99)

## 2019-03-23 MED ORDER — ASCORBIC ACID 500 MG PO TABS
500.0000 mg | ORAL_TABLET | Freq: Every day | ORAL | Status: DC
  Administered 2019-03-24 – 2019-04-20 (×27): 500 mg
  Filled 2019-03-23 (×27): qty 1

## 2019-03-23 MED ORDER — PNEUMOCOCCAL VAC POLYVALENT 25 MCG/0.5ML IJ INJ
0.5000 mL | INJECTION | INTRAMUSCULAR | Status: AC
  Administered 2019-03-25: 0.5 mL via INTRAMUSCULAR
  Filled 2019-03-23: qty 0.5

## 2019-03-23 MED ORDER — INSULIN DETEMIR 100 UNIT/ML ~~LOC~~ SOLN
30.0000 [IU] | Freq: Two times a day (BID) | SUBCUTANEOUS | Status: DC
  Administered 2019-03-23 – 2019-03-24 (×3): 30 [IU] via SUBCUTANEOUS
  Filled 2019-03-23 (×5): qty 0.3

## 2019-03-23 MED ORDER — ADULT MULTIVITAMIN LIQUID CH: 15.0000 mL | Freq: Every day | ORAL | Status: DC

## 2019-03-23 MED ORDER — HYPROMELLOSE (GONIOSCOPIC) 2.5 % OP SOLN
1.0000 [drp] | Freq: Four times a day (QID) | OPHTHALMIC | Status: DC | PRN
  Administered 2019-03-23: 1 [drp] via OPHTHALMIC
  Filled 2019-03-23: qty 15

## 2019-03-23 MED ORDER — TRAZODONE HCL 50 MG PO TABS: 25.0000 mg | ORAL_TABLET | Freq: Every evening | ORAL | Status: DC | PRN

## 2019-03-23 MED ORDER — SENNOSIDES-DOCUSATE SODIUM 8.6-50 MG PO TABS: 1.0000 | ORAL_TABLET | Freq: Every evening | ORAL | Status: DC | PRN

## 2019-03-23 MED ORDER — COLLAGENASE 250 UNIT/GM EX OINT
TOPICAL_OINTMENT | Freq: Every day | CUTANEOUS | Status: DC
  Administered 2019-04-11 – 2019-04-17 (×4): 1 via TOPICAL
  Filled 2019-03-23 (×3): qty 30

## 2019-03-23 MED ORDER — FOLIC ACID 1 MG PO TABS
1.0000 mg | ORAL_TABLET | Freq: Every day | ORAL | Status: DC
  Administered 2019-03-24 – 2019-03-25 (×2): 1 mg
  Filled 2019-03-23 (×2): qty 1

## 2019-03-23 MED ORDER — CHLORHEXIDINE GLUCONATE 0.12% ORAL RINSE (MEDLINE KIT)
15.0000 mL | Freq: Two times a day (BID) | OROMUCOSAL | Status: DC
  Administered 2019-03-24 – 2019-04-11 (×28): 15 mL via OROMUCOSAL

## 2019-03-23 MED ORDER — SODIUM CHLORIDE 0.9% FLUSH: 10.0000 mL | INTRAVENOUS | Status: DC | PRN

## 2019-03-23 MED ORDER — BISACODYL 10 MG RE SUPP
10.0000 mg | Freq: Every day | RECTAL | Status: DC | PRN
  Administered 2019-04-13: 10 mg via RECTAL
  Filled 2019-03-23: qty 1

## 2019-03-23 MED ORDER — APIXABAN 5 MG PO TABS
5.0000 mg | ORAL_TABLET | Freq: Two times a day (BID) | ORAL | Status: DC
  Administered 2019-03-23 – 2019-04-20 (×54): 5 mg
  Filled 2019-03-23 (×54): qty 1

## 2019-03-23 MED ORDER — PRO-STAT SUGAR FREE PO LIQD
30.0000 mL | Freq: Three times a day (TID) | ORAL | Status: DC
  Administered 2019-03-23 – 2019-04-02 (×30): 30 mL
  Filled 2019-03-23 (×28): qty 30

## 2019-03-23 MED ORDER — DOXAZOSIN MESYLATE 1 MG PO TABS: 1.0000 mg | ORAL_TABLET | Freq: Every day | ORAL | Status: DC

## 2019-03-23 MED ORDER — JUVEN PO PACK
1.0000 | PACK | Freq: Two times a day (BID) | ORAL | Status: DC
  Administered 2019-03-24 – 2019-04-02 (×16): 1
  Filled 2019-03-23 (×16): qty 1

## 2019-03-23 MED ORDER — ADULT MULTIVITAMIN LIQUID CH
15.0000 mL | Freq: Every day | ORAL | Status: DC
  Administered 2019-03-24 – 2019-04-19 (×26): 15 mL
  Filled 2019-03-23 (×28): qty 15

## 2019-03-23 MED ORDER — OSMOLITE 1.5 CAL PO LIQD: 1000.0000 mL | ORAL | 0 refills | Status: DC

## 2019-03-23 MED ORDER — POLYETHYLENE GLYCOL 3350 17 G PO PACK
17.0000 g | PACK | Freq: Every day | ORAL | Status: DC
  Administered 2019-03-24: 17 g
  Filled 2019-03-23 (×2): qty 1

## 2019-03-23 MED ORDER — CHLORHEXIDINE GLUCONATE 0.12 % MT SOLN
15.0000 mL | Freq: Two times a day (BID) | OROMUCOSAL | Status: DC
  Administered 2019-03-23 – 2019-04-15 (×41): 15 mL via OROMUCOSAL
  Filled 2019-03-23 (×42): qty 15

## 2019-03-23 MED ORDER — ORAL CARE MOUTH RINSE
15.0000 mL | Freq: Two times a day (BID) | OROMUCOSAL | Status: DC
  Administered 2019-03-24 – 2019-03-29 (×11): 15 mL via OROMUCOSAL

## 2019-03-23 MED ORDER — PROCHLORPERAZINE EDISYLATE 10 MG/2ML IJ SOLN: 5.0000 mg | Freq: Four times a day (QID) | INTRAMUSCULAR | Status: DC | PRN

## 2019-03-23 MED ORDER — JUVEN PO PACK: 1.0000 | PACK | Freq: Two times a day (BID) | ORAL | 0 refills | Status: DC

## 2019-03-23 MED ORDER — SENNOSIDES-DOCUSATE SODIUM 8.6-50 MG PO TABS
1.0000 | ORAL_TABLET | Freq: Two times a day (BID) | ORAL | Status: DC
  Administered 2019-03-23 – 2019-03-25 (×4): 1
  Filled 2019-03-23 (×4): qty 1

## 2019-03-23 MED ORDER — FLEET ENEMA 7-19 GM/118ML RE ENEM: 1.0000 | ENEMA | Freq: Once | RECTAL | Status: DC | PRN

## 2019-03-23 MED ORDER — THIAMINE HCL 100 MG PO TABS
100.0000 mg | ORAL_TABLET | Freq: Every day | ORAL | Status: DC
  Administered 2019-03-24 – 2019-03-25 (×2): 100 mg
  Filled 2019-03-23 (×2): qty 1

## 2019-03-23 MED ORDER — PRO-STAT SUGAR FREE PO LIQD: 30.0000 mL | Freq: Three times a day (TID) | ORAL | 0 refills | Status: DC

## 2019-03-23 MED ORDER — FAMOTIDINE 20 MG PO TABS: 20.0000 mg | ORAL_TABLET | Freq: Two times a day (BID) | ORAL | Status: DC

## 2019-03-23 MED ORDER — FENTANYL CITRATE (PF) 100 MCG/2ML IJ SOLN
25.0000 ug | Freq: Every day | INTRAMUSCULAR | Status: DC
  Administered 2019-03-24 – 2019-04-02 (×9): 25 ug via INTRAVENOUS
  Filled 2019-03-23 (×10): qty 2

## 2019-03-23 MED ORDER — ZINC SULFATE 220 (50 ZN) MG PO CAPS
220.0000 mg | ORAL_CAPSULE | Freq: Every day | ORAL | Status: DC
  Administered 2019-03-24 – 2019-04-20 (×27): 220 mg
  Filled 2019-03-23 (×26): qty 1

## 2019-03-23 MED ORDER — ORAL CARE MOUTH RINSE
15.0000 mL | OROMUCOSAL | Status: DC
  Administered 2019-03-23 – 2019-04-15 (×126): 15 mL via OROMUCOSAL

## 2019-03-23 MED ORDER — APIXABAN 5 MG PO TABS: 5.0000 mg | ORAL_TABLET | Freq: Two times a day (BID) | ORAL | Status: DC

## 2019-03-23 MED ORDER — HYPROMELLOSE (GONIOSCOPIC) 2.5 % OP SOLN
1.0000 [drp] | Freq: Four times a day (QID) | OPHTHALMIC | Status: DC | PRN
  Filled 2019-03-23: qty 15

## 2019-03-23 MED ORDER — DIPHENHYDRAMINE HCL 12.5 MG/5ML PO ELIX
12.5000 mg | ORAL_SOLUTION | Freq: Four times a day (QID) | ORAL | Status: DC | PRN
  Administered 2019-04-03 (×2): 12.5 mg
  Filled 2019-03-23 (×3): qty 10

## 2019-03-23 MED ORDER — LIDOCAINE HCL URETHRAL/MUCOSAL 2 % EX GEL: CUTANEOUS | Status: DC | PRN

## 2019-03-23 MED ORDER — SODIUM CHLORIDE 0.9% FLUSH
10.0000 mL | Freq: Two times a day (BID) | INTRAVENOUS | Status: DC
  Administered 2019-03-24 – 2019-03-26 (×6): 10 mL

## 2019-03-23 MED ORDER — ONDANSETRON HCL 4 MG/2ML IJ SOLN
4.0000 mg | Freq: Four times a day (QID) | INTRAMUSCULAR | Status: DC | PRN
  Administered 2019-03-25 – 2019-04-03 (×3): 4 mg via INTRAVENOUS
  Filled 2019-03-23 (×3): qty 2

## 2019-03-23 MED ORDER — PROCHLORPERAZINE MALEATE 5 MG PO TABS
5.0000 mg | ORAL_TABLET | Freq: Four times a day (QID) | ORAL | Status: DC | PRN
  Administered 2019-03-31: 5 mg via ORAL
  Filled 2019-03-23: qty 1

## 2019-03-23 MED ORDER — OSMOLITE 1.5 CAL PO LIQD
1000.0000 mL | ORAL | Status: DC
  Administered 2019-03-23 – 2019-03-24 (×2): 1000 mL
  Filled 2019-03-23 (×3): qty 1000

## 2019-03-23 MED ORDER — GUAIFENESIN-DM 100-10 MG/5ML PO SYRP
5.0000 mL | ORAL_SOLUTION | Freq: Four times a day (QID) | ORAL | Status: DC | PRN
  Administered 2019-03-28: 10 mL via ORAL
  Filled 2019-03-23: qty 10

## 2019-03-23 MED ORDER — ACETAMINOPHEN 160 MG/5ML PO SOLN
325.0000 mg | ORAL | Status: DC | PRN
  Administered 2019-04-05 – 2019-04-06 (×2): 650 mg
  Filled 2019-03-23 (×2): qty 20.3

## 2019-03-23 MED ORDER — PROCHLORPERAZINE 25 MG RE SUPP: 12.5000 mg | Freq: Four times a day (QID) | RECTAL | Status: DC | PRN

## 2019-03-23 MED ORDER — FOLIC ACID 1 MG PO TABS: 1.0000 mg | ORAL_TABLET | Freq: Every day | ORAL | Status: DC

## 2019-03-23 MED ORDER — INFLUENZA VAC SPLIT QUAD 0.5 ML IM SUSY
0.5000 mL | PREFILLED_SYRINGE | INTRAMUSCULAR | Status: AC
  Administered 2019-03-25: 0.5 mL via INTRAMUSCULAR
  Filled 2019-03-23: qty 0.5

## 2019-03-23 MED ORDER — ALUM & MAG HYDROXIDE-SIMETH 200-200-20 MG/5ML PO SUSP: 30.0000 mL | ORAL | Status: DC | PRN

## 2019-03-23 MED ORDER — FAMOTIDINE 20 MG PO TABS
20.0000 mg | ORAL_TABLET | Freq: Two times a day (BID) | ORAL | Status: DC
  Administered 2019-03-23 – 2019-04-20 (×54): 20 mg
  Filled 2019-03-23 (×54): qty 1

## 2019-03-23 MED ORDER — ACETAMINOPHEN 325 MG PO TABS
325.0000 mg | ORAL_TABLET | ORAL | Status: DC | PRN
  Administered 2019-03-23 – 2019-04-20 (×3): 650 mg via ORAL
  Filled 2019-03-23 (×3): qty 2

## 2019-03-23 MED ORDER — ATORVASTATIN CALCIUM 20 MG PO TABS: 20.0000 mg | ORAL_TABLET | Freq: Every day | ORAL | 0 refills | Status: DC

## 2019-03-23 MED ORDER — GUAIFENESIN 100 MG/5ML PO SOLN
15.0000 mL | Freq: Four times a day (QID) | ORAL | Status: DC | PRN
  Administered 2019-03-31: 300 mg
  Filled 2019-03-23: qty 30

## 2019-03-23 MED ORDER — CHLORHEXIDINE GLUCONATE CLOTH 2 % EX PADS
6.0000 | MEDICATED_PAD | Freq: Every day | CUTANEOUS | Status: DC
  Administered 2019-03-24 – 2019-03-30 (×5): 6 via TOPICAL

## 2019-03-23 MED ORDER — IPRATROPIUM-ALBUTEROL 0.5-2.5 (3) MG/3ML IN SOLN: 3.0000 mL | RESPIRATORY_TRACT | Status: DC | PRN

## 2019-03-23 MED ORDER — FREE WATER
100.0000 mL | Freq: Three times a day (TID) | Status: DC
  Administered 2019-03-23 – 2019-04-19 (×106): 100 mL

## 2019-03-23 MED ORDER — OXYCODONE HCL 5 MG/5ML PO SOLN
5.0000 mg | ORAL | Status: DC | PRN
  Administered 2019-03-26 – 2019-04-13 (×26): 5 mg
  Filled 2019-03-23 (×26): qty 5

## 2019-03-23 MED ORDER — THIAMINE HCL 100 MG PO TABS: 100.0000 mg | ORAL_TABLET | Freq: Every day | ORAL | Status: DC

## 2019-03-23 MED ORDER — POLYETHYLENE GLYCOL 3350 17 G PO PACK
17.0000 g | PACK | Freq: Every day | ORAL | Status: DC | PRN
  Administered 2019-03-31 – 2019-04-16 (×4): 17 g via ORAL
  Filled 2019-03-23 (×3): qty 1

## 2019-03-23 MED ORDER — INSULIN DETEMIR 100 UNIT/ML ~~LOC~~ SOLN: 30.0000 [IU] | Freq: Two times a day (BID) | SUBCUTANEOUS | 11 refills | Status: DC

## 2019-03-23 MED ORDER — INSULIN ASPART 100 UNIT/ML ~~LOC~~ SOLN
0.0000 [IU] | SUBCUTANEOUS | Status: DC
  Administered 2019-03-24 (×2): 4 [IU] via SUBCUTANEOUS
  Administered 2019-03-24: 3 [IU] via SUBCUTANEOUS
  Administered 2019-03-24 – 2019-03-25 (×2): 4 [IU] via SUBCUTANEOUS

## 2019-03-23 MED ORDER — DOXAZOSIN MESYLATE 1 MG PO TABS
1.0000 mg | ORAL_TABLET | Freq: Every day | ORAL | Status: DC
  Administered 2019-03-24 – 2019-04-19 (×27): 1 mg
  Filled 2019-03-23 (×28): qty 1

## 2019-03-23 MED ORDER — ATORVASTATIN CALCIUM 10 MG PO TABS
20.0000 mg | ORAL_TABLET | Freq: Every day | ORAL | Status: DC
  Administered 2019-03-24 – 2019-04-19 (×27): 20 mg
  Filled 2019-03-23 (×27): qty 2

## 2019-03-23 NOTE — Consult Note (Signed)
Reason for Consult: Vocal fold immobility Referring Physician: Medicine  Andrew Bruce is an 54 y.o. male.  HPI: 54 year old male with recent COVID course requiring intubation with mechanical ventilation and eventual tracheostomy placement.  His voice has been breathy since that time.  Speech path evaluation today demonstrated immobility of the right vocal fold.  He is receiving tube feedings via G-tube.  Consultation was requested.  History reviewed. No pertinent past medical history.  Past Surgical History:  Procedure Laterality Date  . IR GASTROSTOMY TUBE MOD SED  03/10/2019    History reviewed. No pertinent family history.  Social History:  reports that he has never smoked. He has never used smokeless tobacco. He reports current alcohol use. He reports that he does not use drugs.  Allergies: No Known Allergies  Medications: I have reviewed the patient's current medications.  Results for orders placed or performed during the hospital encounter of 02/06/19 (from the past 48 hour(s))  Glucose, capillary     Status: Abnormal   Collection Time: 03/21/19  7:11 PM  Result Value Ref Range   Glucose-Capillary 150 (H) 70 - 99 mg/dL    Comment: Glucose reference range applies only to samples taken after fasting for at least 8 hours.  Glucose, capillary     Status: Abnormal   Collection Time: 03/21/19 11:13 PM  Result Value Ref Range   Glucose-Capillary 125 (H) 70 - 99 mg/dL    Comment: Glucose reference range applies only to samples taken after fasting for at least 8 hours.  Basic metabolic panel     Status: Abnormal   Collection Time: 03/22/19  2:35 AM  Result Value Ref Range   Sodium 137 135 - 145 mmol/L   Potassium 4.0 3.5 - 5.1 mmol/L   Chloride 100 98 - 111 mmol/L   CO2 28 22 - 32 mmol/L   Glucose, Bld 146 (H) 70 - 99 mg/dL    Comment: Glucose reference range applies only to samples taken after fasting for at least 8 hours.   BUN 23 (H) 6 - 20 mg/dL   Creatinine, Ser 4.25 (L)  0.61 - 1.24 mg/dL   Calcium 8.7 (L) 8.9 - 10.3 mg/dL   GFR calc non Af Amer >60 >60 mL/min   GFR calc Af Amer >60 >60 mL/min   Anion gap 9 5 - 15    Comment: Performed at [facility], [address], [place], [place] [address]  Magnesium     Status: None   Collection Time: 03/22/19  2:35 AM  Result Value Ref Range   Magnesium 2.2 1.7 - 2.4 mg/dL    Comment: Performed at [facility], [address] [place], [place] [address]  CBC     Status: Abnormal   Collection Time: 03/22/19  2:35 AM  Result Value Ref Range   WBC 10.9 (H) 4.0 - 10.5 K/uL   RBC 3.95 (L) 4.22 - 5.81 MIL/uL   Hemoglobin 10.4 (L) 13.0 - 17.0 g/dL   HCT 32.9 (L) 51.8 - 84.1 %   MCV 86.6 80.0 - 100.0 fL   MCH 26.3 26.0 - 34.0 pg   MCHC 30.4 30.0 - 36.0 g/dL   RDW 66.0 (H) 63.0 - 16.0 %   Platelets 472 (H) 150 - 400 K/uL   nRBC 0.0 0.0 - 0.2 %    Comment: Performed at [facility], [address] [place], [place] [address]  Glucose, capillary     Status: Abnormal  Collection Time: 03/22/19  4:05 AM  Result Value Ref Range   Glucose-Capillary 119 (H) 70 - 99 mg/dL    Comment: Glucose reference range applies only to samples taken after fasting for at least 8 hours.  Glucose, capillary     Status: Abnormal   Collection Time: 03/22/19  7:58 AM  Result Value Ref Range   Glucose-Capillary 176 (H) 70 - 99 mg/dL    Comment: Glucose reference range applies only to samples taken after fasting for at least 8 hours.  Glucose, capillary     Status: Abnormal   Collection Time: 03/22/19 12:28 PM  Result Value Ref Range   Glucose-Capillary 123 (H) 70 - 99 mg/dL    Comment: Glucose reference range applies only to samples taken after fasting for at least 8 hours.  Glucose, capillary     Status: Abnormal   Collection Time: 03/22/19  4:20 PM  Result Value Ref Range   Glucose-Capillary 193 (H) 70 - 99 mg/dL    Comment: Glucose reference range applies only to samples taken after fasting for at least 8 hours.   Glucose, capillary     Status: Abnormal   Collection Time: 03/22/19  8:15 PM  Result Value Ref Range   Glucose-Capillary 156 (H) 70 - 99 mg/dL    Comment: Glucose reference range applies only to samples taken after fasting for at least 8 hours.  Glucose, capillary     Status: Abnormal   Collection Time: 03/22/19 11:23 PM  Result Value Ref Range   Glucose-Capillary 121 (H) 70 - 99 mg/dL    Comment: Glucose reference range applies only to samples taken after fasting for at least 8 hours.  Glucose, capillary     Status: Abnormal   Collection Time: 03/23/19  2:57 AM  Result Value Ref Range   Glucose-Capillary 150 (H) 70 - 99 mg/dL    Comment: Glucose reference range applies only to samples taken after fasting for at least 8 hours.  Glucose, capillary     Status: Abnormal   Collection Time: 03/23/19  7:44 AM  Result Value Ref Range   Glucose-Capillary 138 (H) 70 - 99 mg/dL    Comment: Glucose reference range applies only to samples taken after fasting for at least 8 hours.  Glucose, capillary     Status: Abnormal   Collection Time: 03/23/19 11:01 AM  Result Value Ref Range   Glucose-Capillary 148 (H) 70 - 99 mg/dL    Comment: Glucose reference range applies only to samples taken after fasting for at least 8 hours.  Glucose, capillary     Status: Abnormal   Collection Time: 03/23/19  2:17 PM  Result Value Ref Range   Glucose-Capillary 180 (H) 70 - 99 mg/dL    Comment: Glucose reference range applies only to samples taken after fasting for at least 8 hours.  Glucose, capillary     Status: Abnormal   Collection Time: 03/23/19  4:45 PM  Result Value Ref Range   Glucose-Capillary 121 (H) 70 - 99 mg/dL    Comment: Glucose reference range applies only to samples taken after fasting for at least 8 hours.    No results found.  Review of Systems  All other systems reviewed and are negative.  Blood pressure 107/80, pulse (!) 106, temperature 97.8 F (36.6 C), temperature source Oral,  resp. rate (!) 22, height 5\' 6"  (1.676 m), weight 69.8 kg, SpO2 100 %. Physical Exam  Constitutional: He appears well-developed and well-nourished. No distress.  HENT:  Head: Normocephalic and atraumatic.  Right Ear: External ear normal.  Left Ear: External ear normal.  Nose: Nose normal.  Mouth/Throat: Oropharynx is clear and moist.  Marked breathy dysphonia.  Eyes: Pupils are equal, round, and reactive to light. Conjunctivae and EOM are normal.  Neck:  Cuffless trach in place with Passy-Muir valve in place.  Cardiovascular: Normal rate.  Respiratory: Effort normal.  Musculoskeletal:     Cervical back: Normal range of motion and neck supple.  Neurological: He is alert.  Skin: Skin is warm and dry.  Psychiatric: He has a normal mood and affect. His behavior is normal. Judgment and thought content normal.    Assessment/Plan: Dysphonia, right vocal fold immobility, dysphagia, trach dependence  Fiberoptic exam of the larynx was attempted at the bedside but could not be completed due to gagging and resulting vomiting.  Speech pathology evaluation was reviewed.  I will return in a couple of days to repeat an attempt of laryngoscopy.  Christia Reading 03/23/2019, 6:40 PM

## 2019-03-23 NOTE — Consult Note (Signed)
WOC Nurse wound follow up Patient receiving care in [facility] 2W05.  Coccyx wound evaluated with PT prior to hydrotherapy this morning. Wound type: unstageable to coccyx Measurement: please see PT's note from today. Wound bed: Soft yellow/white slough covering the majority of the wound bed. Drainage (amount, consistency, odor) none Periwound: intact Dressing procedure/placement/frequency: continue hydrotherapy and santyl as previously ordered.  Helmut Muster, RN, MSN, CWOCN, CNS-BC, pager [phone]

## 2019-03-23 NOTE — Progress Notes (Signed)
Physical Therapy Treatment Patient Details Name: Andrew Bruce MRN: [id number] DOB: 1965-08-02 Today's Date: 03/23/2019    History of Present Illness Pt is a 54 y.o. Spanish-speaking male admitted 02/06/19 with worsening SOB with a 10-day h/o COVID-19 ilness. CXR consistent with COVID ARDS. Worsening respiratory status with decompensation requiring intubation 1/24. 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 S/p trach placement 2/16. Liberated from vent to trach collar 2/25. S/p PEG tube. Pt has also developed gangrene of bilateral toes. No significant PMH.    PT Comments    Pt agreeable to therapy this morning. Utilized in house [place] interpreter for communication. Focus of session improving tolerance to standing. With use of Stedy pt able to stand 1 x 30 sec and 1 x 2 min. After second bout of standing pt request to return to bed secondary to dizziness and fatigue. D/c plans remain appropriate. PT will continue to follow acutely.    Follow Up Recommendations  CIR;Supervision/Assistance - 24 hour     Equipment Recommendations  Other (comment)(defer to next venue)       Precautions / Restrictions Precautions Precautions: Fall;Other (comment) Precaution Comments: 35% trach collar, PEG; orthostatic hypotension Restrictions Weight Bearing Restrictions: No    Mobility  Bed Mobility Overal bed mobility: Needs Assistance Bed Mobility: Rolling     Supine to sit: Mod assist;+2 for physical assistance Sit to supine: +2 for physical assistance;Mod assist   General bed mobility comments: modAx2 for helicopter transfer using pad to decrease shear on sacral wound, min A x 2 for management of LE back into bed and center trunk in bed   Transfers Overall transfer level: Needs assistance Equipment used: Ambulation equipment used Transfers: Sit to/from Stand Sit to Stand: +2 physical assistance;Mod assist         General transfer comment: used Stedy  for increasing standing tolerance, stood 1x 30 sec, 1x 34min pt with increased dizziness and fatigue requesting back to bed after second bout of standing     Modified Rankin (Stroke Patients Only) Modified Rankin (Stroke Patients Only) Pre-Morbid Rankin Score: No significant disability Modified Rankin: Severe disability     Balance Overall balance assessment: Needs assistance Sitting-balance support: Feet supported;Single extremity supported Sitting balance-Leahy Scale: Poor Sitting balance - Comments: UE support and min guard to min assist for static sitting Postural control: Posterior lean Standing balance support: Bilateral upper extremity supported Standing balance-Leahy Scale: Poor Standing balance comment: stoodx2 with stedy, 1x 30 sec, 1x 29min                             Cognition Arousal/Alertness: Awake/alert Behavior During Therapy: Flat affect Overall Cognitive Status: Difficult to assess                         Following Commands: Follows one step commands consistently       General Comments: placed PMSV and encouraged speaking          General Comments General comments (skin integrity, edema, etc.): Renaldo Reel for interpretation, encouraged speaking using PMSV, BP at rest 106/76 after activity 116/74, SaO2 on 35% Trach collar 90-100%O2      Pertinent Vitals/Pain Pain Assessment: 0-10 Pain Score: 5 (4 at beginning of session ) Pain Location: buttocks, sacrum Pain Descriptors / Indicators: Grimacing;Restless Pain Intervention(s): Limited activity within patient's tolerance;Monitored during session;Repositioned           PT Goals (current goals  can now be found in the care plan section) Acute Rehab PT Goals Patient Stated Goal: not stated, motivated to participate PT Goal Formulation: With patient/family Time For Goal Achievement: 03/27/19 Potential to Achieve Goals: Fair Progress towards PT goals: Progressing toward  goals    Frequency    Min 3X/week      PT Plan Current plan remains appropriate       AM-PAC PT "6 Clicks" Mobility   Outcome Measure  Help needed turning from your back to your side while in a flat bed without using bedrails?: A Little Help needed moving from lying on your back to sitting on the side of a flat bed without using bedrails?: A Lot Help needed moving to and from a bed to a chair (including a wheelchair)?: Total Help needed standing up from a chair using your arms (e.g., wheelchair or bedside chair)?: A Lot Help needed to walk in hospital room?: Total Help needed climbing 3-5 steps with a railing? : Total 6 Click Score: 10    End of Session Equipment Utilized During Treatment: Oxygen Activity Tolerance: Patient tolerated treatment well Patient left: in bed;with call bell/phone within reach Nurse Communication: Mobility status;Need for lift equipment PT Visit Diagnosis: Muscle weakness (generalized) (M62.81)     Time: 0962-8366 PT Time Calculation (min) (ACUTE ONLY): 31 min  Charges:  $Therapeutic Activity: 23-37 mins                     Dakisha Schoof B. Beverely Risen PT, DPT Acute Rehabilitation Services Pager [phone] Office [phone]    Elon Alas Fleet 03/23/2019, 10:06 AM

## 2019-03-23 NOTE — Procedures (Signed)
Objective Swallowing Evaluation: Type of Study: FEES-Fiberoptic Endoscopic Evaluation of Swallow   Patient Details  Name: Andrew Bruce MRN: [id number] Date of Birth: 14-Jan-1966  Today's Date: 03/23/2019 Time: SLP Start Time (ACUTE ONLY): 1030 -SLP Stop Time (ACUTE ONLY): 1110  SLP Time Calculation (min) (ACUTE ONLY): 40 min   Past Medical History: History reviewed. No pertinent past medical history. Past Surgical History:  Past Surgical History:  Procedure Laterality Date  . IR GASTROSTOMY TUBE MOD SED  03/10/2019   HPI: [name] without a past medical history admitted 1/23 with 48 hours of worsening shortness of breath in the context of a 10-day Covid illness (dry cough, lost taste/smell). Tested positive for Covid 01/26/19. Decompensated requiring intubation 1/24 and trach'd 2/16. 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 Additional smaller right frontal convexity infarct also noted, bilateral pneumothoraces & aortic clot. Pt has developed gangrene of toes as well.     No data recorded   Assessment / Plan / Recommendation  CHL IP CLINICAL IMPRESSIONS 03/23/2019  Clinical Impression Direct visualization of the pharyngeal and laryngeal structures shows fixed, partially adducted, position of right arytenoid, no movement (ad/ab duction) with commands to phonate, cough, bear down or quickly inhale/sniff. Left arytenoid is more open and slightly more mobile. Pt has enough breath support to eject laryngeal secretions, but again there is no adduction with cough, decreasing efficacy. Airway is partially closed putting pt at increased risk of asphyxiation, particularly with swallowing activities. Dysphagia is still moderate to severe due to limited contraction of the right pharyngeal wall, decreased epiglottic deflection and poor UES opening. Pt is able to swallow ice, then  nectar via teaspoon with multiple effortful swallows with moderate right sided pharyngeal and vallecular residue in head neutral and head tilted left without aspiration. However as study progressed with intake of 4 oz of puree and sips of nectar thick liquids, residue above the UES became severe threatening aspiration at the posterior commisure. Pt could not longer transit PO through the cervical esophagus. Pt will need to participate in swallowing activities to continue to rehabilitate musculature and achieve ROM, but given high risk, only recommend ice chips with PMSV in place at this time. Suggested ENT consult given concern for recurrent laryngeal nerve injury following prolonged intubation vs effect of CVA.   SLP Visit Diagnosis Dysphagia, pharyngoesophageal phase (R13.14)  Attention and concentration deficit following --  Frontal lobe and executive function deficit following --  Impact on safety and function Severe aspiration risk      CHL IP TREATMENT RECOMMENDATION 03/23/2019  Treatment Recommendations Therapy as outlined in treatment plan below     Prognosis 03/23/2019  Prognosis for Safe Diet Advancement --  Barriers to Reach Goals Severity of deficits  Barriers/Prognosis Comment --    CHL IP DIET RECOMMENDATION 03/23/2019  SLP Diet Recommendations Ice chips PRN after oral care  Liquid Administration via --  Medication Administration Via alternative means  Compensations --  Postural Changes --      CHL IP OTHER RECOMMENDATIONS 03/23/2019  Recommended Consults Consider ENT evaluation  Oral Care Recommendations --  Other Recommendations --      CHL IP FOLLOW UP RECOMMENDATIONS 03/23/2019  Follow up Recommendations 24 hour supervision/assistance;Inpatient Rehab      CHL IP FREQUENCY AND DURATION 03/23/2019  Speech Therapy Frequency (ACUTE ONLY) min 2x/week  Treatment Duration 2 weeks  CHL IP ORAL PHASE 03/23/2019  Oral Phase WFL  Oral - Pudding Teaspoon --  Oral -  Pudding Cup --  Oral - Honey Teaspoon --  Oral - Honey Cup --  Oral - Nectar Teaspoon --  Oral - Nectar Cup --  Oral - Nectar Straw --  Oral - Thin Teaspoon --  Oral - Thin Cup --  Oral - Thin Straw --  Oral - Puree --  Oral - Mech Soft --  Oral - Regular --  Oral - Multi-Consistency --  Oral - Pill --  Oral Phase - Comment --    CHL IP PHARYNGEAL PHASE 03/23/2019  Pharyngeal Phase Impaired  Pharyngeal- Pudding Teaspoon --  Pharyngeal --  Pharyngeal- Pudding Cup --  Pharyngeal --  Pharyngeal- Honey Teaspoon --  Pharyngeal --  Pharyngeal- Honey Cup --  Pharyngeal --  Pharyngeal- Nectar Teaspoon Reduced epiglottic inversion;Reduced pharyngeal peristalsis;Reduced airway/laryngeal closure;Pharyngeal residue - valleculae;Lateral channel residue  Pharyngeal Material does not enter airway  Pharyngeal- Nectar Cup --  Pharyngeal --  Pharyngeal- Nectar Straw Reduced epiglottic inversion;Reduced pharyngeal peristalsis;Reduced airway/laryngeal closure;Pharyngeal residue - valleculae;Lateral channel residue;Penetration/Apiration after swallow;Trace aspiration  Pharyngeal Material enters airway, CONTACTS cords and then ejected out;Material does not enter airway  Pharyngeal- Thin Teaspoon NT  Pharyngeal --  Pharyngeal- Thin Cup --  Pharyngeal --  Pharyngeal- Thin Straw --  Pharyngeal --  Pharyngeal- Puree Reduced epiglottic inversion;Reduced pharyngeal peristalsis;Reduced airway/laryngeal closure;Pharyngeal residue - valleculae;Lateral channel residue  Pharyngeal --  Pharyngeal- Mechanical Soft --  Pharyngeal --  Pharyngeal- Regular --  Pharyngeal --  Pharyngeal- Multi-consistency --  Pharyngeal --  Pharyngeal- Pill --  Pharyngeal --  Pharyngeal Comment --     CHL IP CERVICAL ESOPHAGEAL PHASE 03/23/2019  Cervical Esophageal Phase Impaired  Pudding Teaspoon --  Pudding Cup --  Honey Teaspoon --  Honey Cup --  Nectar Teaspoon --  Nectar Cup --  Nectar Straw --  Thin Teaspoon --   Thin Cup --  Thin Straw --  Puree Reduced cricopharyngeal relaxation;Esophageal backflow into the pharynx  Mechanical Soft --  Regular --  Multi-consistency --  Pill --  Cervical Esophageal Comment --    [name], MA CCC-SLP  Acute Rehabilitation Services Pager [phone] Office [phone]  [name] 03/23/2019, 12:00 PM

## 2019-03-23 NOTE — Discharge Summary (Signed)
Physician Discharge Summary  Andrew Bruce MRN:[id number] DOB: 12/02/1965 DOA: 02/06/2019  PCP: Patient, No Pcp Per  Admit date: 02/06/2019 Discharge date: 03/23/2019  Admitted From: Home Disposition:  CIR  Recommendations for Outpatient Follow-up:  1. Follow up with PCP in 1-2 weeks 2. Please obtain BMP/CBC in one week your next doctors visit.  3. List of all the new medications are listed below.  Discharge Condition: Stable   Brief/Interim Summary: 53-year-old initially admitted to the hospital for acute hypoxic respiratory failure secondary to COVID-19 pneumonia followed by prolonged hospitalization and complications.  Ended up developing multiple arterial/venous thromboembolic complications including aortic thrombus, CVA, pulmonary embolism and ischemic toes.  Due to pneumothoraces he required bilateral chest tubes eventually underwent tracheostomy placement 2/16 and removal of chest tubes on 2/17.  Currently also has PEG in place.  Overall hemodynamically stable but persistent significant tracheal secretions and leukocytosis.  Patient required prolonged hospitalization and extensive evaluation by speech and swallow team.  Over the course of several days his secretions reduced to requiring suctioning less frequently than every 6 hours.  On the day of discharge swallow team performed F EES which showed some vocal cord dysfunction.  I attempted to contact ENT but no reply after several attempts.  I relayed this to CIR team who are okay with accepting the patient without ENT consult for now as patient does not urgently need this but it would be beneficial  Rest of the details listed below  Acute hypoxic respiratory failure secondary to COVID-19 pneumonia progressed to ARDS complicated by bilateral pneumothoraces Tracheostomy in place -.  Continue clearing techniques. -Frequent deep suctioning by respiratory therapy -Out of bed to chair as tolerated.  Keep head level greater than 30 degree  angle -Bronchodilator therapy. -Pulmonary can attempt to downsize his trach   Vocal cord dysfunction -No obvious trauma visualized.  They are partially adducted -FES performed-partially adducted vocal cords, clearing some secretions.  Attempted to consult ENT multiple times but unable to get in touch with them.  Discussed this with CIR team, as this is nonurgent they will attempt this once patient is in rehab.  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    Uncontrolled diabetes mellitus type 2, poorly controlled due to hyperglycemia -Hemoglobin A1c 11.7. -Continue Lantus 30 units twice daily Sliding scale  Hyperlipidemia -Statin  Unstageable ulcer on coccyx, not present on admission Bilateral ear skin ulcer, stage I right ear, stage II left ear -Local wound care -Seen by wound care team.  Appreciate their recommendations.  Dysphagia with PEG tube in place.  Continue tube feeds at 65 cc/h.  MRI shows multifocal areas of infarct secondary to CVA.   Previously seen by palliative care team for goals of care discussion-family would like to pursue full code at this time.  PT/OT-CIR   Discharge Diagnoses:  Active Problems:   COVID-19   Acute respiratory failure with hypoxia (HCC)   Pneumothorax, right   Cerebral embolism with cerebral infarction   ARDS (adult respiratory distress syndrome) (HCC)   On mechanically assisted ventilation (HCC)   Goals of care, counseling/discussion   Palliative care encounter   Pressure injury of skin   Status post tracheostomy (HCC)    Consultations:  Pulmonary  Subjective: No complaints, feels okay  Discharge Exam: Vitals:   03/23/19 1347 03/23/19 1538  BP:  109/77  Pulse:  (!) 106  Resp:    18  Temp: 97.6 F (36.4 C)   SpO2:  100%   Vitals:    03/23/19 0746 03/23/19 1131 03/23/19 1347 03/23/19 1538  BP: (!) 125/91 107/82  109/77  Pulse: (!) 101 (!) 102  (!) 106  Resp: _0 Temp: 98.4 F (36.9 C) 97.9 F (36.6 C) 97.6 F (36.4 C)   TempSrc: Oral  Oral   SpO2: 100% 100%  100%  Weight:      Height:        General: Pt is alert, awake, not in acute distress Cardiovascular: RRR, S1/S2 +, no rubs, no gallops Respiratory: CTA bilaterally, no wheezing, no rhonchi Abdominal: Soft, NT, ND, bowel sounds + Extremities: no edema, no cyanosis Tracheostomy and PEG tube in place Discharge Instructions   Allergies as of 03/23/2019   No Known Allergies     Medication List    TAKE these medications   apixaban 5 MG Tabs tablet Commonly known as: ELIQUIS Place 1 tablet (5 mg total) into feeding tube 2 (two) times daily.   atorvastatin 20 MG tablet Commonly known as: LIPITOR Place 1 tablet (20 mg total) into feeding tube daily at 6 PM.   doxazosin 1 MG tablet Commonly known as: CARDURA Place 1 tablet (1 mg total) into feeding tube daily. Start taking on: March 24, 2019   famotidine 20 MG tablet Commonly known as: PEPCID Place 1 tablet (20 mg total) into feeding tube 2 (two) times daily.   feeding supplement (OSMOLITE 1.5 CAL) Liqd Place 1,000 mLs into feeding tube continuous.   nutrition supplement (JUVEN) Pack Place 1 packet into feeding tube 2 (two) times daily between meals. Start taking on: March 24, 2019   feeding supplement (PRO-STAT SUGAR FREE 64) Liqd Place 30 mLs into feeding tube 3 (three) times daily.   folic acid 1 MG tablet Commonly known as: FOLVITE Place 1 tablet (1 mg total) into feeding tube daily. Start taking on: March 24, 2019   insulin detemir 100 UNIT/ML injection Commonly known as: LEVEMIR Inject 0.3 mLs (30 Units total) into the skin 2 (two) times daily.   multivitamin Liqd Place 15 mLs into feeding tube daily. Start taking on: March 24, 2019   senna-docusate 8.6-50 MG  tablet Commonly known as: Senokot-S Place 1 tablet into feeding tube at bedtime as needed for mild constipation.   thiamine 100 MG tablet Place 1 tablet (100 mg total) into feeding tube daily. Start taking on: March 24, 2019       No Known Allergies  You were cared for by a hospitalist during your hospital stay. If you have any questions about your discharge medications or the care you received while you were in the hospital after you are discharged, you can call the unit and asked to speak with the hospitalist on call if the hospitalist that took care of you is not available. Once you are discharged, your primary care physician will handle any further medical issues. Please note that no refills for any discharge medications will be authorized once you are discharged, as it is imperative that you return to your primary care physician (or establish a relationship with a primary care physician if you do not have one) for your aftercare needs so that they can reassess your need for medications and monitor your lab values.   Procedures/Studies: CT ABDOMEN WO CONTRAST  Result Date: 03/05/2019 CLINICAL DATA:  Evaluate anatomy for percutaneous gastrostomy tube placement. EXAM: CT ABDOMEN WITHOUT CONTRAST TECHNIQUE: Multidetector CT imaging  of the abdomen was performed following the standard protocol without IV contrast. COMPARISON:  Chest CT 02/25/2019 FINDINGS: Lower chest: Again noted is severe consolidation in the lower lobes, right side greater than left. Extensive air bronchograms in both lower lobes. Patchy parenchymal densities in lower lungs bilaterally. Right chest tube has been removed since the previous examination. No large pneumothorax. Hepatobiliary: High-density material in the gallbladder without inflammatory changes. Normal appearance of the liver. Pancreas: Unremarkable. No pancreatic ductal dilatation or surrounding inflammatory changes. Spleen: Normal in size without focal  abnormality. Adrenals/Urinary Tract: Normal adrenal glands. Mild fullness of the right renal pelvis without hydronephrosis. Normal appearance of left kidney. No suspicious renal lesions. Stomach/Bowel: Normal appearance of the stomach. The colon is caudal to the stomach. There is no bowel intervening between the stomach and anterior abdominal wall. No evidence for bowel obstruction or inflammatory changes. Vascular/Lymphatic: Atherosclerotic calcifications in the abdominal aorta without aneurysm. No significant lymphadenopathy in the abdomen. Other: There appears to be a small periumbilical hernia containing fat. Negative for ascites. Musculoskeletal: No acute bone abnormality. IMPRESSION: 1. Anatomy should be amendable for percutaneous gastrostomy tube placement. 2. Extensive consolidation and airspace disease in the lungs. Findings are similar to the prior chest CT. 3. High-density material in the gallbladder, findings may represent sludge. Electronically Signed   By: Adam  Henn M.D.   On: 03/05/2019 12:36   DG Chest 1 View  Result Date: 03/13/2019 CLINICAL DATA:  Respiratory distress. EXAM: CHEST  1 VIEW COMPARISON:  03/04/2019 and prior radiographs FINDINGS: A tracheostomy tube and RIGHT PICC line again noted. Slightly decreased lung volumes noted. Bilateral interstitial and patchy airspace opacities are again noted and relatively unchanged. No evidence of pneumothorax. No acute bony abnormality. IMPRESSION: Slightly decreased lung volumes, otherwise unchanged chest radiograph. Bilateral interstitial and patchy airspace opacities. Electronically Signed   By: Jeffrey  Hu M.D.   On: 03/13/2019 11:18   DG Chest 1 View  Result Date: 03/03/2019 CLINICAL DATA:  53-year-old male presenting for evaluation of pneumothorax. EXAM: CHEST  1 VIEW COMPARISON:  Chest radiograph dated 03/02/2019. FINDINGS: Tracheostomy in similar position. A feeding tube extends below the diaphragm with tip beyond the inferior margin of  the image. Right-sided PICC with tip close to the cavoatrial junction. Bilateral chest tubes appear in similar position as well. No pneumothorax identified. No significant change in the appearance of the lungs and bilateral airspace densities. Stable cardiomediastinal silhouette. No acute osseous pathology. IMPRESSION: No pneumothorax identified.  No significant interval change. Electronically Signed   By: Arash  Radparvar M.D.   On: 03/03/2019 17:23   DG Abd 1 View  Result Date: 03/04/2019 CLINICAL DATA:  Ileus. EXAM: ABDOMEN - 1 VIEW COMPARISON:  02/22/2019 FINDINGS: Air within nondilated small bowel in the central abdomen. No increased small bowel gas to suggest ileus or obstruction. Small to moderate colonic stool burden. No evidence of free air. No radiopaque calculi. IMPRESSION: No bowel dilatation to suggest ileus or obstruction. Electronically Signed   By: Melanie  Sanford M.D.   On: 03/04/2019 05:38   DG Abd 1 View  Result Date: 02/22/2019 CLINICAL DATA:  53-year-old male with constipation, ileus. EXAM: ABDOMEN - 1 VIEW COMPARISON:  Portable abdomen 02/07/2019. Portable chest 02/20/2019. FINDINGS: Enteric tube terminates in the stomach with side hole at the level of the gastric fundus. Non obstructed bowel gas pattern. There is a moderate volume of retained stool from the transverse colon distally. No acute osseous abnormality identified. Negative abdominal visceral contours. IMPRESSION: 1. Enteric tube   side hole at the gastric fundus. 2. Non obstructed bowel gas pattern with moderate volume of retained stool. Electronically Signed   By: Genevie Ann M.D.   On: 02/22/2019 06:24   CT CHEST WO CONTRAST  Result Date: 02/25/2019 CLINICAL DATA:  Pneumothorax and ARDS EXAM: CT CHEST WITHOUT CONTRAST TECHNIQUE: Multidetector CT imaging of the chest was performed following the standard protocol without IV contrast. COMPARISON:  02/17/2019 chest CT FINDINGS: Cardiovascular: Scattered calcified atherosclerotic  changes in the thoracic aorta. No signs of aneurysmal dilation. Heart size is stable with small pericardial fluid. Limited assessment of vascular structures in the chest secondary to lack of intravenous contrast. Known aortic thrombus not well seen select not well seen not seen central pulmonary vasculature is mildly engorged 3.1 cm Mediastinum/Nodes: Endotracheal tube terminates in the trachea approximately 1.2 cm above the carina. Scattered lymph nodes throughout the mediastinum, nonspecific in the setting of acute illness with similar degree of enlargement when compared to the prior exam. Right sided PICC line terminates at the caval to atrial junction as before. Lungs/Pleura: Patchy peripheral ground-glass and septal thickening at the lung apices appears similar perhaps slightly worse compared to the study of 02/17/2019. Marked interval worsening of bibasilar consolidative changes in the lower lobes with near complete collapse of bilateral lower lobes right worse than left. Tiny basilar pneumothorax on the right. Interval development of cystic change along the minor fissure in the right middle lobe (image 103, series 8) there was some consolidation in this area on the previous study. No fluid is noted in this area. Airways are grossly patent and extensive air bronchograms are noted in the lung bases bilaterally. Bilateral chest tubes remain in place. Upper Abdomen: Incidental imaging of upper abdominal contents shows no acute finding. Musculoskeletal: No signs of acute bone finding or destructive bone process. IMPRESSION: 1. Marked interval worsening of bibasilar consolidative changes with near complete collapse of bilateral lower lobes right worse than left. 2. Interval development of cystic change along the minor fissure in the right middle lobe. No fluid is noted in this area, this may represent developing post infectious pneumatocele. Attention on follow-up. 3. Bilateral chest tubes remain in place. Tiny  right basilar pneumothorax. 4. 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 5. Endotracheal tube approximately 1-1.5 cm above the carina. These results will be called to the ordering clinician or representative by the Radiologist Assistant, and communication documented in the PACS or zVision Dashboard. Aortic Atherosclerosis (ICD10-I70.0). Electronically Signed   By: Zetta Bills M.D.   On: 02/25/2019 14:55   MR BRAIN WO CONTRAST  Result Date: 03/19/2019 CLINICAL DATA:  Encephalopathy. Dysphagia. Acute hypoxic respiratory failure related to COVID-19 pneumonia with secondary thromboembolic events. EXAM: MRI HEAD WITHOUT CONTRAST TECHNIQUE: Multiplanar, multiecho pulse sequences of the brain and surrounding structures were obtained without intravenous contrast. COMPARISON:  Head CT February 16, 2019 FINDINGS: Brain: No acute infarction, hemorrhage, hydrocephalus, extra-axial collection or mass lesion. Subacute infarcts are noted scattered throughout the brain parenchyma including bilateral frontoparietal regions, splenium of the corpus callosum on the right side and most significantly left occipital lobe. There are also chronic bilateral cerebellar infarcts. Susceptibility artifact is noted on the susceptibility weighted images associated with these infarcts, consistent with hemosiderin deposit. Vascular: Normal flow voids. Skull and upper cervical spine: Normal marrow signal. Sinuses/Orbits: Mucosal thickening in the left maxillary sinus. Other: Bilateral mastoid effusion. IMPRESSION: 1. No acute intracranial abnormality identified. 2. Subacute infarcts scattered throughout the brain parenchyma including bilateral  frontoparietal regions, splenium of corpus callosum on the right side and most significantly left occipital lobe. 3. Chronic bilateral cerebellar infarcts. Electronically Signed   By: Katyucia  De Macedo Rodrigues M.D.   On: 03/19/2019 14:24   IR GASTROSTOMY TUBE MOD SED  Result  Date: 03/11/2019 CLINICAL DATA:  Respiratory failure and need for percutaneous gastrostomy tube for nutrition. EXAM: PERCUTANEOUS GASTROSTOMY TUBE PLACEMENT ANESTHESIA/SEDATION: The patient was not formally given full conscious sedation due to high risk. He was given 0.5 mg of IV Versed. CONTRAST:  15mL OMNIPAQUE IOHEXOL 300 MG/ML  SOLN MEDICATIONS: 2 g IV Ancef. IV antibiotic was administered in an appropriate time interval prior to needle puncture of the skin. FLUOROSCOPY TIME:  5 minutes and 42 seconds.  189.4 mGy. PROCEDURE: The procedure, risks, benefits, and alternatives were explained to the patient's wife. Questions regarding the procedure were encouraged and answered. The patient's wife understands and consents to the procedure. A 5-French catheter was then advanced through the patient's mouth under fluoroscopy into the esophagus and to the level of the stomach. This catheter was used to insufflate the stomach with air under fluoroscopy. The abdominal wall was prepped with chlorhexidine in a sterile fashion, and a sterile drape was applied covering the operative field. A sterile gown and sterile gloves were used for the procedure. Local anesthesia was provided with 1% Lidocaine. A skin incision was made in the upper abdominal wall. Under fluoroscopy, an 18 gauge trocar needle was advanced into the stomach. Contrast injection was performed to confirm intraluminal position of the needle tip. A single T tack was then deployed in the lumen of the stomach. This was brought up to tension at the skin surface. Over a guidewire, a 9-French sheath was advanced into the lumen of the stomach. The wire was left in place as a safety wire. A loop snare device from a percutaneous gastrostomy kit was then advanced into the stomach. A floppy guide wire was advanced through the orogastric catheter under fluoroscopy in the stomach. The loop snare advanced through the percutaneous gastric access was used to snare the guide  wire. This allowed withdrawal of the loop snare out of the patient's mouth by retraction of the orogastric catheter and wire. A 20-French bumper retention gastrostomy tube was looped around the snare device. It was then pulled back through the patient's mouth. The retention bumper was brought up to the anterior gastric wall. The T tack suture was cut at the skin. The exiting gastrostomy tube was cut to appropriate length and a feeding adapter applied. The catheter was injected with contrast material to confirm position and a fluoroscopic spot image saved. The tube was then flushed with saline. A dressing was applied over the gastrostomy exit site. COMPLICATIONS: None. FINDINGS: The stomach distended well with air allowing safe placement of the gastrostomy tube. After placement, the tip of the gastrostomy tube lies in the body of the stomach. IMPRESSION: Percutaneous gastrostomy with placement of a 20-French bumper retention tube in the body of the stomach. This tube can be used for percutaneous feeds beginning in 24 hours after placement. Electronically Signed   By: Glenn  Yamagata M.D.   On: 03/11/2019 08:34   DG Chest Port 1 View  Result Date: 03/04/2019 CLINICAL DATA:  Acute respiratory failure. EXAM: PORTABLE CHEST 1 VIEW COMPARISON:  Radiograph yesterday. CT 02/25/2019 FINDINGS: Tracheostomy tube tip at the thoracic inlet. Weighted enteric tube tip below the diaphragm not included in the field of view. Right upper extremity PICC tip at   the atrial caval junction. Stable lung volumes. Unchanged bilateral reticular opacities. More confluent bibasilar opacities have improved. Unchanged heart size and mediastinal contours. No pneumothorax. IMPRESSION: 1. Persistent heterogeneous reticular opacities with improvement in more confluent opacities at the lung bases. 2. Stable support apparatus. Electronically Signed   By: Melanie  Sanford M.D.   On: 03/04/2019 05:36   DG Chest Port 1 View  Result Date:  03/02/2019 CLINICAL DATA:  Status post tracheostomy EXAM: PORTABLE CHEST 1 VIEW COMPARISON:  03/01/2019 FINDINGS: Interval tracheostomy, with appropriate positioning of tracheostomy appliance. Otherwise unchanged AP portable examination with bilateral chest tubes and no significant pneumothorax. Bibasilar heterogeneous and interstitial airspace opacity and small bilateral pleural effusions. The heart and mediastinum are unremarkable. Partially imaged enteric feeding tube. IMPRESSION: 1. Interval tracheostomy with appropriate radiographic position of tracheostomy appliance. 2. Otherwise unchanged AP portable examination with bilateral chest tubes and no significant pneumothorax. 3. Bibasilar heterogeneous and interstitial airspace opacity and small bilateral pleural effusions. Electronically Signed   By: Alex  Bibbey M.D.   On: 03/02/2019 16:34   DG Chest Port 1 View  Result Date: 03/01/2019 CLINICAL DATA:  Acute respiratory failure EXAM: PORTABLE CHEST 1 VIEW COMPARISON:  Three days ago FINDINGS: Endotracheal tube tip is at the clavicular heads. The enteric tube reaches the stomach. Right PICC with tip at the upper cavoatrial junction. Bilateral chest tube in stable position. Stable bilateral pulmonary infiltrate. Lung volumes are low. No visible effusion or pneumothorax. Normal heart size. IMPRESSION: 1. Unremarkable hardware positioning. 2. Stable low volume chest with bilateral opacification. 3. No visible pneumothorax. Electronically Signed   By: Jonathon  Watts M.D.   On: 03/01/2019 05:59   DG CHEST PORT 1 VIEW  Result Date: 02/26/2019 CLINICAL DATA:  Intubation, recent COVID EXAM: PORTABLE CHEST 1 VIEW COMPARISON:  CT chest 02/25/2019, radiograph 02/25/2019 FINDINGS: *Endotracheal tube terminates in the lower trachea, 4.1 cm from the carina. *Bilateral chest tubes remain in place. *A transesophageal tube tip and side port distal to the GE junction. *Right upper extremity PICC tip terminates at the  right atrium. *Telemetry leads overlie the chest. Increasing density of the bilateral interstitial and airspace opacity most pronounced in the lung bases may be related to diminished lung volume/worsening atelectasis. No visible residual medial right pneumothorax. Suspect trace effusions. Cardiomediastinal contours are stable from prior. IMPRESSION: 1. Increasing density of bilateral interstitial and airspace opacities most pronounced in the lung bases some of which may be related to diminished lung volume/worsening atelectasis. 2. No residual visible right pneumothorax. 3. Tubes and lines as above. Electronically Signed   By: Price  DeHay M.D.   On: 02/26/2019 06:35   DG CHEST PORT 1 VIEW  Result Date: 02/25/2019 CLINICAL DATA:  Intubation EXAM: PORTABLE CHEST 1 VIEW COMPARISON:  Radiograph 02/23/2019, CT 02/17/2019 FINDINGS: *Endotracheal tube terminates 4 cm from the carina. *Transesophageal tube tip and side port below the GE junction, beyond the level of imaging. *Bilateral chest tubes are present. *Right upper extremity PICC tip terminates at the superior cavoatrial junction Persistent mixed interstitial and patchy airspace opacities present in both lungs with slightly diminished lung volumes. There is new linear medial lucency in the right lung base which is indeterminate, could reflect right pleural gas along the posterior junction line versus air within the esophagus or other mediastinal gas. No other suspicious air lucencies. Suspect layering bilateral effusions, right greater than left. Otherwise stable mediastinal contours. No acute osseous or soft tissue abnormality. IMPRESSION: 1. New linear lucency in the medial right lung   base is indeterminate, possibly medial pneumothorax versus mediastinal gas. 2. Stable bilateral airspace disease. 3. Lines and tubes as above. Electronically Signed   By: Lovena Le M.D.   On: 02/25/2019 06:00   DG CHEST PORT 1 VIEW  Result Date: 02/23/2019 CLINICAL DATA:   Endotracheal tube. Respiratory distress. COVID pneumonia EXAM: PORTABLE CHEST 1 VIEW COMPARISON:  02/23/2019 at 533 hours FINDINGS: endotracheal tube repositioned to 2.5 cm from carina. NG tube extends the stomach. RIGHT PICC line unchanged. Bilateral chest tubes in place. No pneumothorax. Dense bibasilar airspace disease. IMPRESSION: 1. Endotracheal tube in good position. 2. Bilateral chest tubes without pneumothorax. 3. Dense bibasilar airspace disease unchanged. Electronically Signed   By: Suzy Bouchard M.D.   On: 02/23/2019 10:53   DG Chest Port 1 View  Result Date: 02/23/2019 CLINICAL DATA:  COVID-19, respiratory distress EXAM: PORTABLE CHEST 1 VIEW COMPARISON:  Radiograph 02/20/2019 FINDINGS: *Endotracheal tube terminates in the upper trachea, 8.6 cm from the carina. *Transesophageal tube tip and side port distal to the GE junction. *Right upper extremity PICC terminates at the superior cavoatrial junction. *Bilateral chest tubes are in place. *Telemetry leads overlie the chest. Persistent mixed interstitial and consolidative airspace opacities throughout both lungs with some increasing opacity towards the lung bases. Cardiomediastinal contours are stable. No visible pneumothorax suspect at least trace right effusion. No left effusion. No acute osseous or soft tissue abnormality. Degenerative changes are present in the imaged spine and shoulders. IMPRESSION: Mixed interstitial and airspace opacities, slightly increasing towards the lung base. Could reflect with worsening disease, edema or atelectasis. Support devices as above. Consider advancing the endotracheal tube to the mid trachea (approximately 3 cm). Electronically Signed   By: Lovena Le M.D.   On: 02/23/2019 06:09   DG Swallowing Func-Speech Pathology  Result Date: 03/18/2019 Objective Swallowing Evaluation: Type of Study: MBS-Modified Barium Swallow Study  Patient Details Name: Andrew Bruce MRN: [id number] Date of Birth: [birth year]  Today's Date: 03/18/2019 Time: SLP Start Time (ACUTE ONLY): 1145 -SLP Stop Time (ACUTE ONLY): 1210 SLP Time Calculation (min) (ACUTE ONLY): 25 min Past Medical History: No past medical history on file. Past Surgical History: Past Surgical History: Procedure Laterality Date . IR GASTROSTOMY TUBE MOD SED  03/10/2019 HPI: [name] without a past medical history admitted 1/23 with 48 hours of worsening shortness of breath in the context of a 10-day Covid illness (dry cough, lost taste/smell). Tested positive for Covid 01/26/19. Decompensated requiring intubation 1/24 and trach'd 2/16. 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 Additional smaller right frontal convexity infarct also noted, bilateral pneumothoraces & aortic clot. Pt has developed gangrene of toes as well.   No data recorded Assessment / Plan / Recommendation CHL IP CLINICAL IMPRESSIONS 03/18/2019 Clinical Impression Pt demonstrates severe oropharyngeal and cervical esophageal dysphagia. Pt is able to initiate a swallow and has relatively good laryngeal elevation. Other motor movements for effective swallowing moderately reduced in ROM and strength including base of tongue retraction, hyoid excursion epiglottic deflection and pharyngeal stripping wave. Though pt does improve with an effortful swallow, he has limitied bolus passage through the cervical esophgus with worsening backflow to the pyriforms and even episodes of propulsion of bolus to the nasopharynx. Appears to also be unable to swallow secretions that also pool there. Pt aspirated before the swallow with large sips, and after the swallow due to severe residue. Cough triggered, but completely ineffective. Pt additionally unable  to phonate during cough or speech. No positional strategies very helpful. Concern for decreased UES innervation, and potentially unilaterally decreased  laryngeal adduction. Appearance of impairment not consistent with deconditioning or prolonged intubation. Discussed with MD. Further diagnostic treatment needed (potentially FEES) needed for best treatment plan.  SLP Visit Diagnosis Dysphagia, pharyngoesophageal phase (R13.14) Attention and concentration deficit following -- Frontal lobe and executive function deficit following -- Impact on safety and function Severe aspiration risk   CHL IP TREATMENT RECOMMENDATION 03/12/2019 Treatment Recommendations F/U MBS in --- days (Comment);Defer until completion of intrumental exam   No flowsheet data found. CHL IP DIET RECOMMENDATION 03/18/2019 SLP Diet Recommendations NPO;Alternative means - long-term Liquid Administration via -- Medication Administration Crushed with puree Compensations -- Postural Changes --   CHL IP OTHER RECOMMENDATIONS 03/12/2019 Recommended Consults -- Oral Care Recommendations -- Other Recommendations Have oral suction available   CHL IP FOLLOW UP RECOMMENDATIONS 03/18/2019 Follow up Recommendations 24 hour supervision/assistance;Inpatient Rehab   CHL IP FREQUENCY AND DURATION 03/12/2019 Speech Therapy Frequency (ACUTE ONLY) min 2x/week Treatment Duration --      CHL IP ORAL PHASE 03/18/2019 Oral Phase WFL Oral - Pudding Teaspoon -- Oral - Pudding Cup -- Oral - Honey Teaspoon -- Oral - Honey Cup -- Oral - Nectar Teaspoon -- Oral - Nectar Cup -- Oral - Nectar Straw -- Oral - Thin Teaspoon -- Oral - Thin Cup -- Oral - Thin Straw -- Oral - Puree -- Oral - Mech Soft -- Oral - Regular -- Oral - Multi-Consistency -- Oral - Pill -- Oral Phase - Comment --  CHL IP PHARYNGEAL PHASE 03/18/2019 Pharyngeal Phase Impaired Pharyngeal- Pudding Teaspoon -- Pharyngeal -- Pharyngeal- Pudding Cup -- Pharyngeal -- Pharyngeal- Honey Teaspoon -- Pharyngeal -- Pharyngeal- Honey Cup -- Pharyngeal -- Pharyngeal- Nectar Teaspoon Penetration/Apiration after swallow;Pharyngeal residue - pyriform;Reduced epiglottic inversion;Reduced  tongue base retraction;Reduced anterior laryngeal mobility Pharyngeal Material enters airway, passes BELOW cords and not ejected out despite cough attempt by patient Pharyngeal- Nectar Cup -- Pharyngeal -- Pharyngeal- Nectar Straw Penetration/Apiration after swallow;Pharyngeal residue - pyriform;Reduced epiglottic inversion;Reduced tongue base retraction;Reduced anterior laryngeal mobility;Penetration/Aspiration before swallow;Significant aspiration (Amount);Nasopharyngeal reflux Pharyngeal Material enters airway, passes BELOW cords and not ejected out despite cough attempt by patient Pharyngeal- Thin Teaspoon Penetration/Apiration after swallow;Pharyngeal residue - pyriform;Reduced epiglottic inversion;Reduced tongue base retraction;Reduced anterior laryngeal mobility Pharyngeal Material enters airway, passes BELOW cords and not ejected out despite cough attempt by patient Pharyngeal- Thin Cup -- Pharyngeal -- Pharyngeal- Thin Straw -- Pharyngeal -- Pharyngeal- Puree Penetration/Apiration after swallow;Pharyngeal residue - pyriform;Reduced epiglottic inversion;Reduced tongue base retraction;Reduced anterior laryngeal mobility Pharyngeal -- Pharyngeal- Mechanical Soft -- Pharyngeal -- Pharyngeal- Regular -- Pharyngeal -- Pharyngeal- Multi-consistency -- Pharyngeal -- Pharyngeal- Pill -- Pharyngeal -- Pharyngeal Comment --  No flowsheet data found. Bonnie DeBlois, MA CCC-SLP Acute Rehabilitation Services Pager [phone] Office [phone] DeBlois, Bonnie Caroline 03/18/2019, 4:32 PM                 The results of significant diagnostics from this hospitalization (including imaging, microbiology, ancillary and laboratory) are listed below for reference.     Microbiology: Recent Results (from the past 240 hour(s))  Culture, respiratory (non-expectorated)     Status: None   Collection Time: 03/13/19  6:23 PM   Specimen: Tracheal Aspirate; Respiratory  Result Value Ref Range Status   Specimen Description  TRACHEAL ASPIRATE  Final   Special Requests NONE  Final   Gram Stain   Final    MODERATE GRAM POSITIVE COCCI IN PAIRS   AND CHAINS MODERATE GRAM POSITIVE RODS FEW GRAM NEGATIVE RODS FEW WBC PRESENT,BOTH PMN AND MONONUCLEAR RARE SQUAMOUS EPITHELIAL CELLS PRESENT    Culture   Final    Consistent with normal respiratory flora. Performed at [facility], [address] [place], [place] [address]    Report Status 03/15/2019 FINAL  Final     Labs: BNP (last 3 results) Recent Labs    03/18/19 0427  BNP 65.4   Basic Metabolic Panel: Recent Labs  Lab 03/18/19 0427 03/19/19 0617 03/20/19 0539 03/21/19 0044 03/22/19 0235  NA 146* 143 140 138 137  K 3.6 3.6 3.7 4.0 4.0  CL 107 106 102 101 100  CO2 28 28 28 26 28  GLUCOSE 171* 100* 121* 150* 146*  BUN 24* 24* 25* 28* 23*  CREATININE 0.37* 0.36* 0.39* 0.39* 0.33*  CALCIUM 8.7* 8.7* 8.7* 8.6* 8.7*  MG 2.2 2.2 2.3 2.2 2.2   Liver Function Tests: Recent Labs  Lab 03/18/19 0427 03/19/19 0617  AST 42* 43*  ALT 93* 100*  ALKPHOS 101 103  BILITOT 0.4 0.5  PROT 6.5 6.9  ALBUMIN 2.2* 2.3*   No results for input(s): LIPASE, AMYLASE in the last 168 hours. No results for input(s): AMMONIA in the last 168 hours. CBC: Recent Labs  Lab 03/17/19 0513 03/20/19 0539 03/21/19 0044 03/22/19 0235  WBC 14.4* 9.6 9.6 10.9*  NEUTROABS 11.4*  --   --   --   HGB 9.7* 10.5* 10.8* 10.4*  HCT 33.3* 35.0* 36.1* 34.2*  MCV 91.5 87.7 87.8 86.6  PLT 491* 477* 459* 472*   Cardiac Enzymes: No results for input(s): CKTOTAL, CKMB, CKMBINDEX, TROPONINI in the last 168 hours. BNP: Invalid input(s): POCBNP CBG: Recent Labs  Lab 03/22/19 2323 03/23/19 0257 03/23/19 0744 03/23/19 1101 03/23/19 1417  GLUCAP 121* 150* 138* 148* 180*   D-Dimer No results for input(s): DDIMER in the last 72 hours. Hgb A1c No results for input(s): HGBA1C in the last 72 hours. Lipid Profile No results for input(s): CHOL, HDL, LDLCALC, TRIG, CHOLHDL,  LDLDIRECT in the last 72 hours. Thyroid function studies Recent Labs    03/21/19 0044  TSH 4.156   Anemia work up No results for input(s): VITAMINB12, FOLATE, FERRITIN, TIBC, IRON, RETICCTPCT in the last 72 hours. Urinalysis No results found for: COLORURINE, APPEARANCEUR, LABSPEC, PHURINE, GLUCOSEU, HGBUR, BILIRUBINUR, KETONESUR, PROTEINUR, UROBILINOGEN, NITRITE, LEUKOCYTESUR Sepsis Labs Invalid input(s): PROCALCITONIN,  WBC,  LACTICIDVEN Microbiology Recent Results (from the past 240 hour(s))  Culture, respiratory (non-expectorated)     Status: None   Collection Time: 03/13/19  6:23 PM   Specimen: Tracheal Aspirate; Respiratory  Result Value Ref Range Status   Specimen Description TRACHEAL ASPIRATE  Final   Special Requests NONE  Final   Gram Stain   Final    MODERATE GRAM POSITIVE COCCI IN PAIRS AND CHAINS MODERATE GRAM POSITIVE RODS FEW GRAM NEGATIVE RODS FEW WBC PRESENT,BOTH PMN AND MONONUCLEAR RARE SQUAMOUS EPITHELIAL CELLS PRESENT    Culture   Final    Consistent with normal respiratory flora. Performed at [facility], [address] [place], [place] [address]    Report Status 03/15/2019 FINAL  Final     Time coordinating discharge:  I have spent 35 minutes face to face with the patient and on the ward discussing the patients care, assessment, plan and disposition with other care givers. >50% of the time was devoted counseling the patient about the risks and benefits of treatment/Discharge disposition and coordinating   care.   SIGNED:   Damita Lack, MD  [facility] 03/23/2019, 3:51 PM   If 7PM-7AM, please contact night-coverage

## 2019-03-23 NOTE — Progress Notes (Addendum)
Inpatient Rehab Admissions Coordinator:   Pt with improvement in secretions today.  Sounds much better at bedside, alert and engaging with me.  Awaiting final confirmation from Dr. Nelson Chimes that pt is cleared to admit today, at which point I will let pt/family and CM know.   Addendum: Have confirmation from Dr. Nelson Chimes.  ENT to f/u with pt for consult on CIR.  Will let pt/family and CM know.   Estill Dooms, PT, DPT Admissions Coordinator [phone] 03/23/19  1:27 PM

## 2019-03-23 NOTE — Procedures (Signed)
Preop diagnosis: Vocal fold immobility, dysphonia Postop diagnosis: same Procedure: Transnasal fiberoptic laryngoscopy Surgeon: Jenne Pane Anesth: Topical with 4% lidocaine Compl: None Findings: The exam could not be completed due to gagging and vomiting. Description:  After discussing risks, benefits, and alternatives, the patient was placed in a seated position and the right nasal passage was sprayed with topical anesthetic.  The fiberoptic scope was passed through the right nasal passage to view the pharynx and larynx but had to be aborted due to gagging and vomiting before an adequate evaluation could be performed.  He was not interested in another try today.  He was returned to nursing care in stable condition.

## 2019-03-23 NOTE — PMR Pre-admission (Signed)
PMR Admission Coordinator Pre-Admission Assessment   Patient: Andrew Bruce is an 54 y.o., male MRN: [id number] DOB: 10/28/1965 Height: 5' 6" (167.6 cm) Weight: 69.8 kg   Insurance Information HMO:     PPO:      PCP:      IPA:      80/20:      OTHER:  PRIMARY: Uninsured. Pt eligible for emergency Medicaid only. Family aware.       Policy#:       Subscriber:  CM Name:       Phone#:      Fax#:  Pre-Cert#:       Employer:  Benefits:  Phone #:      Name:  Eff. Date:      Deduct:       Out of Pocket Max:       Life Max:  CIR:       SNF:  Outpatient:      Co-Pay:  Home Health:       Co-Pay:  DME:      Co-Pay:  Providers:  SECONDARY:       Policy#:       Subscriber:  CM Name:       Phone#:      Fax#:  Pre-Cert#:       Employer:  Benefits:  Phone #:      Name:  Eff. Date:      Deduct:       Out of Pocket Max:       Life Max:  CIR:       SNF:  Outpatient:      Co-Pay:  Home Health:       Co-Pay:  DME:      Co-Pay:    Medicaid Application Date:       Case Manager:  Disability Application Date:       Case Worker:    The "Data Collection Information Summary" for patients in Inpatient Rehabilitation Facilities with attached "Privacy Act Statement-Health Care Records" was provided and verbally reviewed with: N/A   Emergency Contact Information         Contact Information     Name Relation Home Work Mobile    Sandquist, [name] Daughter     [phone]    Ky,MYRA Spouse [phone]   [phone]    Correll,STEPHANIE Daughter     [phone]    Martin, norma Daughter     [phone]         Current Medical History  Patient Admitting Diagnosis: post-covid debility, CVAs   History of Present Illness: Andrew Bruce is a 54 year old RH Hispanic male (limited english) who was originally admitted on 02/06/19 after 10 day history of Covid 19 and progressive SOB X 48  Hours. He was treated for Covid 19 ARDS with Remdesivir and Dexamethasone. He decompensated requiring  intubation on 01/24 and bilateral chest tubes placed for pneumothoraces. As sedation weaned, on 02/03 he was noted to have decreased movement on right and CT head showed large area of subacute infarct in left occipital lobe and additional smaller area in right frontal convexity. 2D echo showed EF of 55 to 60% with moderately reduced systolic function of right ventricle globally consistent with acute cor pulmonale.  CT angio of chest done revealing LLL segmental pulmonary artery embolus, noncalcified plaque of the aortic arch, multifocal pneumonia background of emphysema and probable degree of interstitial fibrosis.  He was started on full dose Lovenox for treatment.  Dr. 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 Patient also with new diagnosis diabetes with hemoglobin A1c 11.7 that was started on Levemir.   Complete NIHSS TOTAL: 31   Patient's medical record from [facility] has been reviewed by the rehabilitation admission coordinator and physician.   Past Medical History  History reviewed. No pertinent past medical history.   Family History   family history is not on file.   Prior Rehab/Hospitalizations Has the patient had prior rehab or hospitalizations prior to admission? No   Has the patient had major surgery during 100 days prior to admission? Yes              Current Medications   Current Facility-Administered Medications:  .  0.9 %  sodium chloride infusion, 250 mL, Intravenous, Continuous, Yacoub, Wesam G, MD, Stopped at 03/12/19 1606 .  acetaminophen (TYLENOL) 160 MG/5ML solution 650 mg, 650 mg, Per Tube, Q6H PRN, Yacoub, Wesam G, MD, 650 mg at 03/16/19 2121 .  apixaban (ELIQUIS) tablet 5 mg, 5 mg, Per Tube, BID, Ollis, Brandi L, NP, 5 mg at 03/23/19 1106 .  ascorbic acid (VITAMIN C) tablet 500 mg, 500 mg, Per Tube, Daily, Yacoub, Wesam G, MD, 500 mg at 03/23/19 1106 .  atorvastatin (LIPITOR) tablet 20 mg, 20 mg, Per  Tube, q1800, Byrum, Robert S, MD, 20 mg at 03/22/19 1714 .  chlorhexidine gluconate (MEDLINE KIT) (PERIDEX) 0.12 % solution 15 mL, 15 mL, Mouth Rinse, BID, Yacoub, Wesam G, MD, 15 mL at 03/23/19 0952 .  Chlorhexidine Gluconate Cloth 2 % PADS 6 each, 6 each, Topical, Daily, Yacoub, Wesam G, MD, 6 each at 03/23/19 0953 .  collagenase (SANTYL) ointment, , Topical, Daily, Arrien, Mauricio Daniel, MD, Given at 03/23/19 1108 .  doxazosin (CARDURA) tablet 1 mg, 1 mg, Per Tube, Daily, Ollis, Brandi L, NP, 1 mg at 03/23/19 1107 .  famotidine (PEPCID) tablet 20 mg, 20 mg, Per Tube, BID, Yacoub, Wesam G, MD, 20 mg at 03/23/19 1104 .  feeding supplement (OSMOLITE 1.5 CAL) liquid 1,000 mL, 1,000 mL, Per Tube, Continuous, Smith, Daniel C, MD, Last Rate: 65 mL/hr at 03/23/19 0027, 1,000 mL at 03/23/19 0027 .  feeding supplement (PRO-STAT SUGAR FREE 64) liquid 30 mL, 30 mL, Per Tube, TID, Amin, Ankit Chirag, MD, 30 mL at 03/23/19 1105 .  fentaNYL (SUBLIMAZE) injection 12.5-25 mcg, 12.5-25 mcg, Intravenous, Q4H PRN, Arrien, Mauricio Daniel, MD, 25 mcg at 03/23/19 1011 .  folic acid (FOLVITE) tablet 1 mg, 1 mg, Per Tube, Daily, Yacoub, Wesam G, MD, 1 mg at 03/23/19 1106 .  guaiFENesin (ROBITUSSIN) 100 MG/5ML solution 300 mg, 15 mL, Oral, Q6H PRN, Byrum, Robert S, MD, 300 mg at 03/12/19 0018 .  hydrALAZINE (APRESOLINE) injection 10 mg, 10 mg, Intravenous, Q4H PRN, Sommer, Steven E, MD, 10 mg at 03/08/19 0329 .  hydroxypropyl methylcellulose / hypromellose (ISOPTO TEARS / GONIOVISC) 2.5 % ophthalmic solution 1 drop, 1 drop, Both Eyes, QID PRN, Amin, Ankit Chirag, MD .  insulin aspart (novoLOG) injection 0-20 Units, 0-20 Units, Subcutaneous, Q4H, Smith, Daniel C, MD, 4 Units at 03/23/19 1420 .  insulin detemir (LEVEMIR) injection 30 Units, 30 Units, Subcutaneous, BID, Arrien, Mauricio Daniel, MD, 30 Units at 03/23/19 1104 .  ipratropium-albuterol (DUONEB) 0.5-2.5 (3) MG/3ML nebulizer solution 3 mL, 3 mL, Nebulization, Q4H  PRN, Amin, Ankit Chirag, MD .  MEDLINE mouth rinse, 15 mL, Mouth Rinse, 10 times per day, Yacoub, Wesam G, MD, 15 mL at 03/23/19 1421 .    metoprolol tartrate (LOPRESSOR) injection 5 mg, 5 mg, Intravenous, Q4H PRN, Arrien, Mauricio Daniel, MD, 5 mg at 03/15/19 0536 .  multivitamin liquid 15 mL, 15 mL, Per Tube, Daily, Ramaswamy, Murali, MD, 15 mL at 03/23/19 1105 .  nutrition supplement (JUVEN) (JUVEN) powder packet 1 packet, 1 packet, Per Tube, BID BM, Clark, Laura P, DO, 1 packet at 03/23/19 1429 .  ondansetron (ZOFRAN) injection 4 mg, 4 mg, Intravenous, Q6H PRN, Smith, Daniel C, MD, 4 mg at 03/08/19 0819 .  polyethylene glycol (MIRALAX / GLYCOLAX) packet 17 g, 17 g, Per Tube, Daily, Egan, Theresa D, RPH, 17 g at 03/23/19 1105 .  polyethylene glycol (MIRALAX / GLYCOLAX) packet 17 g, 17 g, Oral, Daily PRN, Amin, Ankit Chirag, MD, 17 g at 03/22/19 1719 .  senna-docusate (Senokot-S) tablet 1 tablet, 1 tablet, Per Tube, BID, Masters, Alison M, RPH, 1 tablet at 03/23/19 1105 .  senna-docusate (Senokot-S) tablet 2 tablet, 2 tablet, Oral, QHS PRN, Amin, Ankit Chirag, MD .  sodium chloride flush (NS) 0.9 % injection 10-40 mL, 10-40 mL, Intracatheter, Q12H, Smith, Daniel C, MD, 10 mL at 03/23/19 1109 .  sodium chloride flush (NS) 0.9 % injection 10-40 mL, 10-40 mL, Intracatheter, PRN, Smith, Daniel C, MD, 10 mL at 03/11/19 1359 .  thiamine tablet 100 mg, 100 mg, Per Tube, Daily, Yacoub, Wesam G, MD, 100 mg at 03/23/19 1106 .  zinc sulfate capsule 220 mg, 220 mg, Per Tube, Daily, Yacoub, Wesam G, MD, 220 mg at 03/23/19 1109   Patients Current Diet:     Diet Order     None         Precautions / Restrictions Precautions Precautions: Fall, Other (comment) Precaution Comments: 28% trach collar Restrictions Weight Bearing Restrictions: No    Has the patient had 2 or more falls or a fall with injury in the past year? No   Prior Activity Level Community (5-7x/wk): independent, driving, no DME used    Prior Functional Level Self Care: Did the patient need help bathing, dressing, using the toilet or eating? Independent   Indoor Mobility: Did the patient need assistance with walking from room to room (with or without device)? Independent   Stairs: Did the patient need assistance with internal or external stairs (with or without device)? Independent   Functional Cognition: Did the patient need help planning regular tasks such as shopping or remembering to take medications? Independent   Home Assistive Devices / Equipment Home Assistive Devices/Equipment: None   Prior Device Use: Indicate devices/aids used by the patient prior to current illness, exacerbation or injury? None of the above   Current Functional Level Cognition   Arousal/Alertness: Awake/alert Overall Cognitive Status: Difficult to assess Difficult to assess due to: Tracheostomy, Non-English speaking Current Attention Level: Selective Orientation Level: Oriented to person, Oriented to situation Following Commands: Follows one step commands consistently General Comments: placed PMSV and encouraged speaking  Attention: Focused, Sustained Focused Attention: Appears intact Sustained Attention: Impaired    Extremity Assessment (includes Sensation/Coordination)   Upper Extremity Assessment: Generalized weakness, RUE deficits/detail RUE Deficits / Details: grossly 2/5 throughout, weaker triceps vs biceps RUE Sensation: (to be further assessed) RUE Coordination: decreased fine motor, decreased gross motor LUE Deficits / Details: grossly 2+/5 LUE Coordination: decreased fine motor, decreased gross motor  Lower Extremity Assessment: Defer to PT evaluation RLE Deficits / Details: Grossly 1/5 LLE Deficits / Details: Grossly 2/5     ADLs   Overall ADL's : Needs assistance/impaired Eating/Feeding: Maximal assistance, Bed level(Ice   chip ) Grooming: Oral care, Wash/dry hands, Sitting, Minimal assistance Grooming Details  (indicate cue type and reason): attempted to have pt use yonker for suctioning Upper Body Dressing : Maximal assistance, Bed level Lower Body Dressing: Total assistance, Bed level Lower Body Dressing Details (indicate cue type and reason): donning socks Functional mobility during ADLs: Maximal assistance, +2 for physical assistance, +2 for safety/equipment General ADL Comments: pt with improvements in level of alertness today, strength and command following     Mobility   Overal bed mobility: Needs Assistance Bed Mobility: Rolling Rolling: Min guard Sidelying to sit: Max assist, +2 for physical assistance, +2 for safety/equipment Supine to sit: Mod assist, +2 for physical assistance Sit to supine: +2 for physical assistance, Mod assist Sit to sidelying: Max assist, +2 for physical assistance, +2 for safety/equipment General bed mobility comments: modAx2 for helicopter transfer using pad to decrease shear on sacral wound, min A x 2 for management of LE back into bed and center trunk in bed      Transfers   Overall transfer level: Needs assistance Equipment used: Ambulation equipment used Transfer via Lift Equipment: Stedy Transfers: Sit to/from Stand Sit to Stand: +2 physical assistance, Mod assist Squat pivot transfers: Max assist, +2 physical assistance General transfer comment: used Stedy for increasing standing tolerance, stood 1x 30 sec, 1x 2min pt with increased dizziness and fatigue requesting back to bed after second bout of standing     Ambulation / Gait / Stairs / Wheelchair Mobility   Ambulation/Gait General Gait Details: unable     Posture / Balance Dynamic Sitting Balance Sitting balance - Comments: UE support and min guard to min assist for static sitting Balance Overall balance assessment: Needs assistance Sitting-balance support: Feet supported, Single extremity supported Sitting balance-Leahy Scale: Poor Sitting balance - Comments: UE support and min guard to min  assist for static sitting Postural control: Posterior lean Standing balance support: Bilateral upper extremity supported Standing balance-Leahy Scale: Poor Standing balance comment: stoodx2 with stedy, 1x 30 sec, 1x 2min      Special needs/care consideration BiPAP/CPAP no CPM no Continuous Drip IV no Dialysis no        Days n/a Life Vest no Oxygen 5L via 28% trach collar Special Bed yes, air mattress overlay Trach Size 6 cuffless Wound Vac (area) no      Location n/a Skin peg tube, MASD                        Bowel mgmt: incontinent Bladder mgmt: incontinent/condom cath Diabetic mgmt: yes Behavioral consideration no Chemo/radiation no    Previous Home Environment (from acute therapy documentation) Living Arrangements: Spouse/significant other  Lives With: Spouse Home Care Services: No Additional Comments: pt unable to provide   Discharge Living Setting Plans for Discharge Living Setting: Patient's home Type of Home at Discharge: House Discharge Home Layout: Two level, Bed/bath upstairs Alternate Level Stairs-Rails: Right Alternate Level Stairs-Number of Steps: 12 Discharge Home Access: Stairs to enter Entrance Stairs-Rails: Can reach both Entrance Stairs-Number of Steps: 4 Discharge Bathroom Shower/Tub: Walk-in shower Discharge Bathroom Toilet: Standard Discharge Bathroom Accessibility: Yes How Accessible: Accessible via walker Does the patient have any problems obtaining your medications?: Yes (Describe)(not a citizen)   Social/Family/Support Systems Patient Roles: Spouse, Parent Anticipated Caregiver: [name] (daughter), Myra (spouse), also has 2 other dtrs, 5 siblings, and other family members willing to assist Anticipated Caregiver's Contact Information: [name] [phone] Caregiver Availability: 24/7 Discharge Plan Discussed with Primary Caregiver:   Yes Is Caregiver In Agreement with Plan?: Yes Does Caregiver/Family have Issues with Lodging/Transportation while  Pt is in Rehab?: No   Goals/Additional Needs Patient/Family Goal for Rehab: PT/OT min assist, SLP min assist Expected length of stay: 21-24 days Cultural Considerations: spanish speaking Dietary Needs: NPO with cortrack Pt/Family Agrees to Admission and willing to participate: Yes Program Orientation Provided & Reviewed with Pt/Caregiver Including Roles  & Responsibilities: Yes   Decrease burden of Care through IP rehab admission: n/a   Possible need for SNF placement upon discharge: Not anticipated. Pt with excellent family support.    Patient Condition: I have reviewed medical records from [facility], spoken with CM, and patient and daughter. I met with patient at the bedside for inpatient rehabilitation assessment.  Patient will benefit from ongoing PT, OT and SLP, can actively participate in 3 hours of therapy a day 5 days of the week, and can make measurable gains during the admission.  Patient will also benefit from the coordinated team approach during an Inpatient Acute Rehabilitation admission.  The patient will receive intensive therapy as well as Rehabilitation physician, nursing, social worker, and care management interventions.  Due to bladder management, bowel management, safety, skin/wound care, disease management, medication administration, pain management and patient education the patient requires 24 hour a day rehabilitation nursing.  The patient is currently mod +2 with mobility and basic ADLs.  Discharge setting and therapy post discharge at home with no follow up available is anticipated.  Patient has agreed to participate in the Acute Inpatient Rehabilitation Program and will admit today.   Preadmission Screen Completed By:  Caitlin E Warren, PT, DPT 03/23/2019 3:56 PM ______________________________________________________________________   Discussed status with Dr. Kace Hartje on 03/23/19  at 3:56 PM  and received approval for admission today.   Admission Coordinator:   Caitlin E Warren, PT, DPT time 3:56 PM /Date 03/23/19     Assessment/Plan: Diagnosis: 1. Does the need for close, 24 hr/day Medical supervision in concert with the patient's rehab needs make it unreasonable for this patient to be served in a less intensive setting? Yes 2. Co-Morbidities requiring supervision/potential complications: trach, COVID illness, multiple CVAs, bilateral pneumothoraces, aortic clot, gangrene of toes 3. Due to bladder management, bowel management, safety, skin/wound care, disease management, medication administration, pain management and patient education, does the patient require 24 hr/day rehab nursing? Yes 4. Does the patient require coordinated care of a physician, rehab nurse, PT, OT, and SLP to address physical and functional deficits in the context of the above medical diagnosis(es)? Yes Addressing deficits in the following areas: balance, endurance, locomotion, strength, transferring, bowel/bladder control, bathing, dressing, feeding, grooming, toileting, cognition, speech, language, swallowing and psychosocial support 5. Can the patient actively participate in an intensive therapy program of at least 3 hrs of therapy 5 days a week? Yes 6. The potential for patient to make measurable gains while on inpatient rehab is good 7. Anticipated functional outcomes upon discharge from inpatient rehab: minA PT, minA OT, mod assist SLP 8. Estimated rehab length of stay to reach the above functional goals is: 2-3 weeks 9. Anticipated discharge destination: Home 10. Overall Rehab/Functional Prognosis: excellent     MD Signature: Donta Fuster, MD  

## 2019-03-23 NOTE — Progress Notes (Signed)
FBS was 84 at 1947

## 2019-03-23 NOTE — H&P (Addendum)
Physical Medicine and Rehabilitation Admission H&P    Chief Complaint  Patient presents with  . 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   Janina Mayo dependent     HPI: Andrew Bruce is a 54 year old RH Hispanic male (limited english) who was originally admitted on 02/06/19 after 10 day history of Covid 19 and progressive SOB X 48  Hours. He was treated for Covid 19 ARDS with Remdesivir and Dexamethasone. He decompensated requiring intubation on 01/24 and bilateral chest tubes placed for pneumothoraces. As sedation weaned, on 02/03 he was noted to have decreased movement on right and CT head showed large area of subacute infarct in left occipital lobe and additional smaller area in right frontal convexity. 2D echo showed EF of 55 to 60% with moderately reduced systolic function of right ventricle globally consistent with acute cor pulmonale.  CT angio of chest done revealing LLL segmental pulmonary artery embolus, noncalcified plaque of the aortic arch, multifocal pneumonia background of emphysema and probable degree of interstitial fibrosis.  He was started on full dose Lovenox for treatment as well as IV antibiotics for LLL pneumonia. Follow-up MRI brain done on 3/05 due to ongoing encephalopathy and showed subacute infarcts in bilateral frontoparietal regions, splenium of corpus callosum on the right and most significantly left occipital lobe.  Dr. Roda Shutters felt the stroke was embolic in setting of Covid related hypercoagulopathy v/s vasculitis.  Gangrenous changes of the toes due to embolism being monitored.  Palliative care consulted to discuss goals of care and family elected on full scope treatment.  Tracheostomy performed 2/16 and he was slowly weaned to extubated to ATC by 02/22-->CFS #6 in place. Treated with Unasyn 2/18-2/23 for witnessed aspiration event and PEG placed by Dr. Fredia Sorrow on 02/24.  Patient also with new diagnosis diabetes with hemoglobin A1c 11.7 and was  started on Levemir.  Foley was placed briefly due to urinary retention and Flomax added to help with voiding function.  He required frequent suctioning due to copious secretions however these are decreasing and he is tolerating PMSV during the day.  Hydrotherapy initiated 03/04 for treatment of unstageable sacral decub. He has been NPO due to severe dysphagia mid dysphonia and FEES done 3/9 showing fixed partially adducted right arytenoid with no movement and left arytenoid slightly more mobile with partially closed airway "putting patient at increased risk for asphyxiation especially with swallowing activity". ENT consulted for input.  Patient noted to be significantly debilitated and working on standing and standing for 1 to 2 minutes with orthostatic symptoms. Significant visual deficits reported with naming objects and able to answer basic mild/and questions with 100% accuracy per speech therapy.  Therapy ongoing and CIR recommended due to functional decline    Review of Systems  Unable to perform ROS: Other  Constitutional: Negative for chills and fever.  Respiratory: Positive for cough and sputum production. Negative for shortness of breath.   Cardiovascular: Negative for chest pain and palpitations.  Gastrointestinal: Positive for abdominal pain (discomfort due to PEG).  Musculoskeletal:       Sacral pain on and off  Neurological: Positive for speech change and weakness.     History reviewed. No pertinent past medical history.    Past Surgical History:  Procedure Laterality Date  . IR GASTROSTOMY TUBE MOD SED  03/10/2019    Family History: Unable to elicit.     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 Per  reports that he has never smoked. He has never used smokeless tobacco. Per reports current alcohol use. He reports that he does not use drugs.    Allergies: No Known Allergies    Medications Prior to  Admission  Medication Sig Dispense Refill  . Amino Acids-Protein Hydrolys (FEEDING SUPPLEMENT, PRO-STAT SUGAR FREE 64,) LIQD Place 30 mLs into feeding tube 3 (three) times daily. 887 mL 0  . apixaban (ELIQUIS) 5 MG TABS tablet Place 1 tablet (5 mg total) into feeding tube 2 (two) times daily. 60 tablet   . atorvastatin (LIPITOR) 20 MG tablet Place 1 tablet (20 mg total) into feeding tube daily at 6 PM. 30 tablet 0  . [START ON 03/24/2019] doxazosin (CARDURA) 1 MG tablet Place 1 tablet (1 mg total) into feeding tube daily.    . famotidine (PEPCID) 20 MG tablet Place 1 tablet (20 mg total) into feeding tube 2 (two) times daily.    Melene Muller. [START ON 03/24/2019] folic acid (FOLVITE) 1 MG tablet Place 1 tablet (1 mg total) into feeding tube daily.    . insulin detemir (LEVEMIR) 100 UNIT/ML injection Inject 0.3 mLs (30 Units total) into the skin 2 (two) times daily. 10 mL 11  . [START ON 03/24/2019] Multiple Vitamin (MULTIVITAMIN) LIQD Place 15 mLs into feeding tube daily.    Melene Muller. [START ON 03/24/2019] nutrition supplement, JUVEN, (JUVEN) PACK Place 1 packet into feeding tube 2 (two) times daily between meals.  0  . Nutritional Supplements (FEEDING SUPPLEMENT, OSMOLITE 1.5 CAL,) LIQD Place 1,000 mLs into feeding tube continuous.  0  . senna-docusate (SENOKOT-S) 8.6-50 MG tablet Place 1 tablet into feeding tube at bedtime as needed for mild constipation.    Melene Muller. [START ON 03/24/2019] thiamine 100 MG tablet Place 1 tablet (100 mg total) into feeding tube daily.      Drug Regimen Review  Drug regimen was reviewed and remains appropriate with no significant issues identified  Home: Home Living Family/patient expects to be discharged to:: Private residence Living Arrangements: Spouse/significant other Additional Comments: pt unable to provide  Lives With: Spouse   Functional History: Prior Function Comments: Pt unable to provide  Functional Status:  Mobility: Bed Mobility Overal bed mobility: Needs Assistance  Bed Mobility: Rolling Rolling: Min guard Sidelying to sit: Max assist, +2 for physical assistance, +2 for safety/equipment Supine to sit: Mod assist, +2 for physical assistance Sit to supine: +2 for physical assistance, Mod assist Sit to sidelying: Max assist, +2 for physical assistance, +2 for safety/equipment General bed mobility comments: modAx2 for helicopter transfer using pad to decrease shear on sacral wound, min A x 2 for management of LE back into bed and center trunk in bed  Transfers Overall transfer level: Needs assistance Equipment used: Ambulation equipment used Transfer via Lift Equipment: Stedy Transfers: Sit to/from Stand Sit to Stand: +2 physical assistance, Mod assist Squat pivot transfers: Max assist, +2 physical assistance General transfer comment: used Stedy for increasing standing tolerance, stood 1x 30 sec, 1x 2min pt with increased dizziness and fatigue requesting back to bed after second bout of standing Ambulation/Gait General Gait Details: unable  ADL: ADL Overall ADL's : Needs assistance/impaired Eating/Feeding: Maximal assistance, Bed level(Ice chip ) Grooming: Oral care, Wash/dry hands, Sitting, Minimal assistance Grooming Details (indicate cue type and reason): attempted to have pt use yonker for suctioning Upper Body Dressing : Maximal assistance, Bed level Lower Body Dressing: Total assistance, Bed level Lower Body Dressing Details (indicate cue type and  reason): donning socks Functional mobility during ADLs: Maximal assistance, +2 for physical assistance, +2 for safety/equipment General ADL Comments: pt with improvements in level of alertness today, strength and command following  Cognition: Cognition Overall Cognitive Status: Difficult to assess Arousal/Alertness: Awake/alert Orientation Level: Oriented to person, Oriented to situation Attention: Focused, Sustained Focused Attention: Appears intact Sustained Attention: Impaired Cognition  Arousal/Alertness: Awake/alert Behavior During Therapy: Flat affect Overall Cognitive Status: Difficult to assess Area of Impairment: Attention, Following commands, Problem solving Current Attention Level: Selective Following Commands: Follows one step commands consistently Problem Solving: Slow processing, Decreased initiation, Requires verbal cues, Requires tactile cues, Difficulty sequencing General Comments: placed PMSV and encouraged speaking  Difficult to assess due to: Tracheostomy, Non-English speaking  Physical Exam  Nursing note and vitals reviewed. Constitutional: He appears well-developed and well-nourished. No distress. Lying in bed Throat: Cuffless trach with PMSV in place with ATC.  Dysphonic Cardiovascular: Tachycardia present. HR 110 at rest  Respiratory: Frequent weak cough worse with speaking attempts.   GI: PEG site with dry dressing.   Musculoskeletal:        General: No edema.     Comments: Multiple toes with dry gangrene.  Diffuse weakness without focal deficits 4/5 in bilateral UE and 3/5 in bilateral LE.  Neurological: He is alert.  Dysphonic speech--limited output due to coughing attempts. RLE>RUE weakness. Sensory changes bilateral feet.   Skin: He is not diaphoretic.  Sacral decub covered with foam dressing.     Blood pressure 110/81, pulse (!) 108, temperature (!) 97.3 F (36.3 C), temperature source Oral, resp. rate 20, SpO2 94 %.     Results for orders placed or performed during the hospital encounter of 02/06/19 (from the past 48 hour(s))  Glucose, capillary     Status: Abnormal   Collection Time: 03/21/19 11:13 PM  Result Value Ref Range   Glucose-Capillary 125 (H) 70 - 99 mg/dL    Comment: Glucose reference range applies only to samples taken after fasting for at least 8 hours.  Basic metabolic panel     Status: Abnormal   Collection Time: 03/22/19  2:35 AM  Result Value Ref Range   Sodium 137 135 - 145 mmol/L   Potassium 4.0 3.5 - 5.1  mmol/L   Chloride 100 98 - 111 mmol/L   CO2 28 22 - 32 mmol/L   Glucose, Bld 146 (H) 70 - 99 mg/dL    Comment: Glucose reference range applies only to samples taken after fasting for at least 8 hours.   BUN 23 (H) 6 - 20 mg/dL   Creatinine, Ser 5.80 (L) 0.61 - 1.24 mg/dL   Calcium 8.7 (L) 8.9 - 10.3 mg/dL   GFR calc non Af Amer >60 >60 mL/min   GFR calc Af Amer >60 >60 mL/min   Anion gap 9 5 - 15    Comment: Performed at [facility], [address] [place], [place] [address]  Magnesium     Status: None   Collection Time: 03/22/19  2:35 AM  Result Value Ref Range   Magnesium 2.2 1.7 - 2.4 mg/dL    Comment: Performed at [facility], [address], [place], [place] [address]  CBC     Status: Abnormal   Collection Time: 03/22/19  2:35 AM  Result Value Ref Range   WBC 10.9 (H) 4.0 - 10.5 K/uL   RBC 3.95 (L) 4.22 - 5.81 MIL/uL   Hemoglobin 10.4 (L) 13.0 - 17.0 g/dL   HCT 39.7 (  L) 39.0 - 52.0 %   MCV 86.6 80.0 - 100.0 fL   MCH 26.3 26.0 - 34.0 pg   MCHC 30.4 30.0 - 36.0 g/dL   RDW 17.0 (H) 11.5 - 15.5 %   Platelets 472 (H) 150 - 400 K/uL   nRBC 0.0 0.0 - 0.2 %    Comment: Performed at [facility] [address], [place], [place] [address]  Glucose, capillary     Status: Abnormal   Collection Time: 03/22/19  4:05 AM  Result Value Ref Range   Glucose-Capillary 119 (H) 70 - 99 mg/dL    Comment: Glucose reference range applies only to samples taken after fasting for at least 8 hours.  Glucose, capillary     Status: Abnormal   Collection Time: 03/22/19  7:58 AM  Result Value Ref Range   Glucose-Capillary 176 (H) 70 - 99 mg/dL    Comment: Glucose reference range applies only to samples taken after fasting for at least 8 hours.  Glucose, capillary     Status: Abnormal   Collection Time: 03/22/19 12:28 PM  Result Value Ref Range   Glucose-Capillary 123 (H) 70 - 99 mg/dL    Comment: Glucose reference range applies only to samples taken after fasting for at least 8  hours.  Glucose, capillary     Status: Abnormal   Collection Time: 03/22/19  4:20 PM  Result Value Ref Range   Glucose-Capillary 193 (H) 70 - 99 mg/dL    Comment: Glucose reference range applies only to samples taken after fasting for at least 8 hours.  Glucose, capillary     Status: Abnormal   Collection Time: 03/22/19  8:15 PM  Result Value Ref Range   Glucose-Capillary 156 (H) 70 - 99 mg/dL    Comment: Glucose reference range applies only to samples taken after fasting for at least 8 hours.  Glucose, capillary     Status: Abnormal   Collection Time: 03/22/19 11:23 PM  Result Value Ref Range   Glucose-Capillary 121 (H) 70 - 99 mg/dL    Comment: Glucose reference range applies only to samples taken after fasting for at least 8 hours.  Glucose, capillary     Status: Abnormal   Collection Time: 03/23/19  2:57 AM  Result Value Ref Range   Glucose-Capillary 150 (H) 70 - 99 mg/dL    Comment: Glucose reference range applies only to samples taken after fasting for at least 8 hours.  Glucose, capillary     Status: Abnormal   Collection Time: 03/23/19  7:44 AM  Result Value Ref Range   Glucose-Capillary 138 (H) 70 - 99 mg/dL    Comment: Glucose reference range applies only to samples taken after fasting for at least 8 hours.  Glucose, capillary     Status: Abnormal   Collection Time: 03/23/19 11:01 AM  Result Value Ref Range   Glucose-Capillary 148 (H) 70 - 99 mg/dL    Comment: Glucose reference range applies only to samples taken after fasting for at least 8 hours.  Glucose, capillary     Status: Abnormal   Collection Time: 03/23/19  2:17 PM  Result Value Ref Range   Glucose-Capillary 180 (H) 70 - 99 mg/dL    Comment: Glucose reference range applies only to samples taken after fasting for at least 8 hours.  Glucose, capillary     Status: Abnormal   Collection Time: 03/23/19  4:45 PM  Result Value Ref Range   Glucose-Capillary 121 (H) 70 - 99  mg/dL    Comment: Glucose reference range  applies only to samples taken after fasting for at least 8 hours.   No results found.     Medical Problem List and Plan: 1.  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   -patient may shower  -ELOS/Goals: 2-3 weeks minA in PT, OT, SLP 2.  Antithrombotics: -DVT/anticoagulation:  Pharmaceutical: Other (comment)--Eliquis  -antiplatelet therapy: N/A 3. Pain Management: Fentanyl IV prior to hydrotherapy. Will add oxycodone prn for sacral pain.  4. Mood: LCSW to follow for evaluation and support.   -antipsychotic agents: N/A 5. Neuropsych: This patient appears to be capable of making decisions on his own behalf. 6. Sacral decubitus/Skin/Wound Care: continue air mattress overlay. Hydrotherapy 6 days a week. Tube feeds with additional protein supplement and vitamins to help promote wound healing.  7. Fluids/Electrolytes/Nutrition: Monitor I/O. Check lytes in am and weekly.  8. T2DM: Hgb A1c- 11.7--continue Levemir bid with SSI every 4 hours for elevated BS. Will consult dietician to start bolus tube feeds.  9. Dizziness: Will change Cardura to bedtime to avoid SE. Will monitor orthostatic BPs 10. 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  12. Resting tachycardia: Due to deconditioning. No BB due to soft BP/orthostatic symptoms.  Monitor for now.  13. Gangrenous changes bilateral feet: Will order off loading shoes. Continue to monitor.  14. Abnormal LFTs: Recheck labs in am.  15. ABLA: Due to critical illness. Will check anemia panel.  16. Right Vocal fold immobility, dysphonia:s/p transnasal fiberoptic laryngoscopy by ENT on 3/9 but was aborted due to gagging and vomiting. ENT will re-attempt in a couple of days.  17. Disposition: Patient lives with wife and 5 children aged 70-23 who will provide support upon discharge.   Delle Reining, PA-C  I have personally performed a face to face diagnostic evaluation,  including, but not limited to relevant history and physical exam findings, of this patient and developed relevant assessment and plan.  Additionally, I have reviewed and concur with the physician assistant's documentation above.  The patient's status has not changed. The original post admission physician evaluation remains appropriate, and any changes from the pre-admission screening or documentation from the acute chart are noted above.   Horton Chin, MD 03/23/2019

## 2019-03-23 NOTE — Progress Notes (Signed)
Assisted patient;s nurse in admission process. Patient was alert & responsive when noted. Tracheostomy tube in place with passy muir valve, trach collar in place delivering humidified oxygen, respiratory therapy was called to alert of patient admission & did assess patient. He has a g tube to the left upper quadrant that is patent, flushes easily & has surrounding dressing that is dry & intact. Triple lumen PICC noted to the RUE & a peripheral IV to the left wrist. He has a coccyx pressure injury. Injuries to bil ears not noted on assessment, but patient has necrotic toes to bil feet. The left foot has the first 4 toes that are black & cool to the touch. He can move them slightly. The right foot has toes 1-3 & 5 that are black. The 4th toe is purple, he can move these slightly as well. He has a discolored area to the back of his upper right thigh & scarring from chest tube incisions. External catheter was also in place. No signs of acute distress noted. Call bell is within reach & left in care of his nurse.

## 2019-03-23 NOTE — Progress Notes (Signed)
PROGRESS NOTE    Andrew Bruce  MRN:[id number] DOB: 08/18/1965 DOA: 02/06/2019 PCP: Patient, No Pcp Per   Brief Narrative:  53-year-old initially admitted to the hospital for acute hypoxic respiratory failure secondary to COVID-19 pneumonia followed by prolonged hospitalization and complications.  Ended up developing multiple arterial/venous thromboembolic complications including aortic thrombus, CVA, pulmonary embolism and ischemic toes.  Due to pneumothoraces he required bilateral chest tubes eventually underwent tracheostomy placement 2/16 and removal of chest tubes on 2/17.  Currently also has PEG in place.  Overall hemodynamically stable but persistent significant tracheal secretions and leukocytosis.   Assessment & Plan:   Active Problems:   COVID-19   Acute respiratory failure with hypoxia (HCC)   Pneumothorax, right   Cerebral embolism with cerebral infarction   ARDS (adult respiratory distress syndrome) (HCC)   On mechanically assisted ventilation (HCC)   Goals of care, counseling/discussion   Palliative care encounter   Pressure injury of skin   Status post tracheostomy (HCC)  Acute hypoxic respiratory failure secondary to COVID-19 pneumonia progressed to ARDS complicated by bilateral pneumothoraces Tracheostomy in place -Amount of secretions have significantly improved.  Continue clearing techniques.  Now requiring suctioning probably about every 6 hours -Frequent deep suctioning by respiratory therapy -Out of bed to chair as tolerated.  Keep head level greater than 30 degree angle -Bronchodilator therapy. -Pulmonary following -FES performed-partially adducted vocal cords, clearing some secretions.  Will consult ENT  Vocal cord dysfunction -No obvious trauma visualized.  Will consult ENT.  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    Uncontrolled diabetes mellitus type 2, poorly controlled due to hyperglycemia -Hemoglobin A1c 11.7. -Continue Lantus 30 units twice daily Sliding scale  Hyperlipidemia -Statin  Unstageable ulcer on coccyx, not present on admission Bilateral ear skin ulcer, stage I right ear, stage II left ear -Local wound care -Seen by wound care team.  Appreciate their recommendations.  Dysphagia with PEG tube in place.  Continue tube feeds at 65 cc/h.  MRI shows multifocal areas of infarct secondary to CVA.  Speech therapy will continue to work with him.  Previously seen by palliative care team for goals of care discussion-family would like to pursue full code at this time.  PT/OT-plans for CIR  DVT prophylaxis: Eliquis Code Status: Full code Family Communication: No answer but daughter Disposition Plan:   Patient From= home  Patient Anticipated D/C place= CIR  Barriers= frequency of suctioning has reduced.  Transition to CIR   Consultants:   Pulmonary   Antimicrobials:  Remdesivir 1/23>>1/28 Dexamethasone 1/23 >> 2/2  Vanc 2/5 >> 2/12  Zosyn 2/5 >> 2/9 Cefepime 2/9 >> 2/11 Unasyn 2/18 >> 2/23     Subjective: Breath sounds are much clear with much clear of secretions.  Review of Systems Otherwise negative except as per HPI, including: General = no fevers, chills, dizziness,  fatigue HEENT/EYES = negative for loss of vision, double vision, blurred vision,  sore throa Cardiovascular= negative for chest pain, palpitation Respiratory/lungs= negative for shortness of breath, cough, wheezing; hemoptysis,  Gastrointestinal= negative for nausea, vomiting, abdominal pain Genitourinary= negative for Dysuria MSK = Negative for arthralgia, myalgias Neurology= Negative for headache, numbness, tingling  Psychiatry= Negative for suicidal and homocidal ideation Skin= Negative for  Rash   Examination: Constitutional: Not in acute distress Respiratory: Anterior breath sounds are much better Cardiovascular: Normal sinus rhythm,   no rubs Abdomen: Nontender nondistended good bowel sounds Musculoskeletal: No edema noted Skin: No rashes seen Neurologic: CN 2-12 grossly intact.  And nonfocal Psychiatric: Normal judgment and insight. Alert and oriented x 3. Normal mood.   Tracheostomy tube in place PEG tube in place  Objective: Vitals:   03/23/19 0526 03/23/19 0740 03/23/19 0746 03/23/19 1131  BP:   (!) 125/91 107/82  Pulse: 99 100 (!) 101 (!) 102  Resp: (!) _0 Temp:   98.4 F (36.9 C) 97.9 F (36.6 C)  TempSrc:   Oral   SpO2: 99% 100% 100% 100%  Weight:      Height:        Intake/Output Summary (Last 24 hours) at 03/23/2019 1214 Last data filed at 03/23/2019 0349 Gross per 24 hour  Intake --  Output 1500 ml  Net -1500 ml   Filed Weights   03/15/19 0306 03/16/19 0438 03/17/19 0451  Weight: 70.7 kg 70.8 kg 69.8 kg     Data Reviewed:   CBC: Recent Labs  Lab 03/17/19 0513 03/20/19 0539 03/21/19 0044 03/22/19 0235  WBC 14.4* 9.6 9.6 10.9*  NEUTROABS 11.4*  --   --   --   HGB 9.7* 10.5* 10.8* 10.4*  HCT 33.3* 35.0* 36.1* 34.2*  MCV 91.5 87.7 87.8 86.6  PLT 491* 477* 459* 376*   Basic Metabolic Panel: Recent Labs  Lab 03/18/19 0427 03/19/19 0617 03/20/19 0539 03/21/19 0044 03/22/19 0235  NA 146* 143 140 138 137  K 3.6 3.6 3.7 4.0 4.0  CL 107 106 102 101 100  CO2 _1 GLUCOSE 171* 100* 121* 150* 146*  BUN 24* 24* 25* 28* 23*  CREATININE 0.37* 0.36* 0.39* 0.39* 0.33*  CALCIUM 8.7* 8.7* 8.7* 8.6* 8.7*  MG 2.2 2.2 2.3 2.2 2.2   GFR: Estimated Creatinine Clearance: 97.5 mL/min (A) (by C-G formula based on SCr of 0.33 mg/dL (L)). Liver Function Tests: Recent Labs  Lab 03/18/19 0427 03/19/19 0617  AST 42* 43*  ALT 93* 100*  ALKPHOS 101 103  BILITOT 0.4 0.5  PROT 6.5 6.9  ALBUMIN 2.2* 2.3*   No results for  input(s): LIPASE, AMYLASE in the last 168 hours. No results for input(s): AMMONIA in the last 168 hours. Coagulation Profile: No results for input(s): INR, PROTIME in the last 168 hours. Cardiac Enzymes: No results for input(s): CKTOTAL, CKMB, CKMBINDEX, TROPONINI in the last 168 hours. BNP (last 3 results) No results for input(s): PROBNP in the last 8760 hours. HbA1C: No results for input(s): HGBA1C in the last 72 hours. CBG: Recent Labs  Lab 03/22/19 2015 03/22/19 2323 03/23/19 0257 03/23/19 0744 03/23/19 1101  GLUCAP 156* 121* 150* 138* 148*   Lipid Profile: No results for input(s): CHOL, HDL, LDLCALC, TRIG, CHOLHDL, LDLDIRECT in the last 72 hours. Thyroid Function Tests: Recent Labs    03/21/19 0044  TSH 4.156   Anemia Panel: No results for input(s): VITAMINB12, FOLATE, FERRITIN, TIBC, IRON, RETICCTPCT in the last 72 hours. Sepsis Labs: No results for input(s): PROCALCITON, LATICACIDVEN in the last 168 hours.  Recent Results (from the past 240 hour(s))  Culture, respiratory (non-expectorated)     Status: None   Collection Time: 03/13/19  6:23 PM   Specimen: Tracheal Aspirate; Respiratory  Result Value Ref Range Status   Specimen Description TRACHEAL ASPIRATE  Final   Special Requests NONE  Final   Gram Stain   Final    MODERATE GRAM POSITIVE COCCI IN PAIRS  AND CHAINS MODERATE GRAM POSITIVE RODS FEW GRAM NEGATIVE RODS FEW WBC PRESENT,BOTH PMN AND MONONUCLEAR RARE SQUAMOUS EPITHELIAL CELLS PRESENT    Culture   Final    Consistent with normal respiratory flora. Performed at [facility], [address] [place], [place] [address]    Report Status 03/15/2019 FINAL  Final         Radiology Studies: No results found.      Scheduled Meds: . apixaban  5 mg Per Tube BID  . vitamin C  500 mg Per Tube Daily  . atorvastatin  20 mg Per Tube q1800  . chlorhexidine gluconate (MEDLINE KIT)  15 mL Mouth Rinse BID  . Chlorhexidine Gluconate Cloth  6 each  Topical Daily  . collagenase   Topical Daily  . doxazosin  1 mg Per Tube Daily  . famotidine  20 mg Per Tube BID  . feeding supplement (PRO-STAT SUGAR FREE 64)  30 mL Per Tube TID  . folic acid  1 mg Per Tube Daily  . insulin aspart  0-20 Units Subcutaneous Q4H  . insulin detemir  30 Units Subcutaneous BID  . mouth rinse  15 mL Mouth Rinse 10 times per day  . multivitamin  15 mL Per Tube Daily  . nutrition supplement (JUVEN)  1 packet Per Tube BID BM  . polyethylene glycol  17 g Per Tube Daily  . senna-docusate  1 tablet Per Tube BID  . sodium chloride flush  10-40 mL Intracatheter Q12H  . thiamine  100 mg Per Tube Daily  . zinc sulfate  220 mg Per Tube Daily   Continuous Infusions: . sodium chloride Stopped (03/12/19 1606)  . feeding supplement (OSMOLITE 1.5 CAL) 1,000 mL (03/23/19 0027)     LOS: 45 days   Time spent= 35 mins     Chirag , MD [facility]  If 7PM-7AM, please contact night-coverage  03/23/2019, 12:14 PM   

## 2019-03-23 NOTE — Progress Notes (Signed)
PMR Admission Coordinator Pre-Admission Assessment   Patient: Andrew Bruce is an 54 y.o., male MRN: [id number] DOB: 03/23/1965 Height: _0  (167.6 cm) Weight: 69.8 kg   Insurance Information HMO:     PPO:      PCP:      IPA:      80/20:      OTHER:  PRIMARY: Uninsured. Pt eligible for emergency Medicaid only. Family aware.       Policy#:       Subscriber:  CM Name:       Phone#:      Fax#:  Pre-Cert#:       Employer:  Benefits:  Phone #:      Name:  Eff. Date:      Deduct:       Out of Pocket Max:       Life Max:  CIR:       SNF:  Outpatient:      Co-Pay:  Home Health:       Co-Pay:  DME:      Co-Pay:  Providers:  SECONDARY:       Policy#:       Subscriber:  CM Name:       Phone#:      Fax#:  Pre-Cert#:       Employer:  Benefits:  Phone #:      Name:  Eff. Date:      Deduct:       Out of Pocket Max:       Life Max:  CIR:       SNF:  Outpatient:      Co-Pay:  Home Health:       Co-Pay:  DME:      Co-Pay:    Medicaid Application Date:       Case Manager:  Disability Application Date:       Case Worker:    The "Data Collection Information Summary" for patients in Inpatient Rehabilitation Facilities with attached "Privacy Act [place] Records" was provided and verbally reviewed with: N/A   Emergency Contact Information         Contact Information     Name Relation Home Work [place], [place] Daughter     [phone]    Andrew Bruce [phone]   [phone]    [facility] Daughter     [phone]    Andrew Bruce Daughter     [phone]         Current Medical History  Patient Admitting Diagnosis: post-covid debility, CVAs   History of Present Illness: Andrew Bruce is a 54 year old RH Hispanic male (limited english) who was originally admitted on 02/06/19 after 10 day history of Covid 19 and progressive SOB X 48  Hours. He was treated for Covid 19 ARDS with Remdesivir and Dexamethasone. He decompensated requiring  intubation on 01/24 and bilateral chest tubes placed for pneumothoraces. As sedation weaned, on 02/03 he was noted to have decreased movement on right and CT head showed large area of subacute infarct in left occipital lobe and additional smaller area in right frontal convexity. 2D echo showed EF of 55 to 60% with moderately reduced systolic function of right ventricle globally consistent with acute cor pulmonale.  CT angio of chest done revealing LLL segmental pulmonary artery embolus, noncalcified plaque of the aortic arch, multifocal pneumonia background of emphysema and probable degree of interstitial fibrosis.  He was started on full dose Lovenox for treatment.  Dr. Erlinda Hong felt  the stroke was embolic in setting of PE/DVT was discovered hypercoagulopathy/vasculitis. Patient also with new diagnosis diabetes with hemoglobin A1c 11.7 that was started on Levemir.   Complete NIHSS TOTAL: 31   Patient's medical record from [facility] has been reviewed by the rehabilitation admission coordinator and physician.   Past Medical History  History reviewed. No pertinent past medical history.   Family History   family history is not on file.   Prior Rehab/Hospitalizations Has the patient had prior rehab or hospitalizations prior to admission? No   Has the patient had major surgery during 100 days prior to admission? Yes              Current Medications   Current Facility-Administered Medications:  .  0.9 %  sodium chloride infusion, 250 mL, Intravenous, Continuous, Rush Farmer, MD, Stopped at 03/12/19 1606 .  acetaminophen (TYLENOL) 160 MG/5ML solution 650 mg, 650 mg, Per Tube, Q6H PRN, Rush Farmer, MD, 650 mg at 03/16/19 2121 .  apixaban (ELIQUIS) tablet 5 mg, 5 mg, Per Tube, BID, Ollis, Brandi L, NP, 5 mg at 03/23/19 1106 .  ascorbic acid (VITAMIN C) tablet 500 mg, 500 mg, Per Tube, Daily, Rush Farmer, MD, 500 mg at 03/23/19 1106 .  atorvastatin (LIPITOR) tablet 20 mg, 20 mg, Per  Tube, q1800, Collene Gobble, MD, 20 mg at 03/22/19 1714 .  chlorhexidine gluconate (MEDLINE KIT) (PERIDEX) 0.12 % solution 15 mL, 15 mL, Mouth Rinse, BID, Rush Farmer, MD, 15 mL at 03/23/19 0952 .  Chlorhexidine Gluconate Cloth 2 % PADS 6 each, 6 each, Topical, Daily, Rush Farmer, MD, 6 each at 03/23/19 [phone] .  collagenase (SANTYL) ointment, , Topical, Daily, Arrien, Jimmy Picket, MD, Given at 03/23/19 1108 .  doxazosin (CARDURA) tablet 1 mg, 1 mg, Per Tube, Daily, Ollis, Brandi L, NP, 1 mg at 03/23/19 1107 .  famotidine (PEPCID) tablet 20 mg, 20 mg, Per Tube, BID, Rush Farmer, MD, 20 mg at 03/23/19 1104 .  feeding supplement (OSMOLITE 1.5 CAL) liquid 1,000 mL, 1,000 mL, Per Tube, Continuous, Candee Furbish, MD, Last Rate: 65 mL/hr at 03/23/19 0027, 1,000 mL at 03/23/19 0027 .  feeding supplement (PRO-STAT SUGAR FREE 64) liquid 30 mL, 30 mL, Per Tube, TID, Amin, Ankit Chirag, MD, 30 mL at 03/23/19 1105 .  fentaNYL (SUBLIMAZE) injection 12.5-25 mcg, 12.5-25 mcg, Intravenous, Q4H PRN, Arrien, Jimmy Picket, MD, 25 mcg at 10/30/49 0258 .  folic acid (FOLVITE) tablet 1 mg, 1 mg, Per Tube, Daily, Rush Farmer, MD, 1 mg at 03/23/19 1106 .  guaiFENesin (ROBITUSSIN) 100 MG/5ML solution 300 mg, 15 mL, Oral, Q6H PRN, Collene Gobble, MD, 300 mg at 03/12/19 0018 .  hydrALAZINE (APRESOLINE) injection 10 mg, 10 mg, Intravenous, Q4H PRN, [name] Simmonds, MD, 10 mg at 03/08/19 0329 .  hydroxypropyl methylcellulose / hypromellose (ISOPTO TEARS / GONIOVISC) 2.5 % ophthalmic solution 1 drop, 1 drop, Both Eyes, QID PRN, Amin, Ankit Chirag, MD .  insulin aspart (novoLOG) injection 0-20 Units, 0-20 Units, Subcutaneous, Q4H, Candee Furbish, MD, 4 Units at 03/23/19 1420 .  insulin detemir (LEVEMIR) injection 30 Units, 30 Units, Subcutaneous, BID, Arrien, Jimmy Picket, MD, 30 Units at 03/23/19 1104 .  ipratropium-albuterol (DUONEB) 0.5-2.5 (3) MG/3ML nebulizer solution 3 mL, 3 mL, Nebulization, Q4H  PRN, Amin, Ankit Chirag, MD .  MEDLINE mouth rinse, 15 mL, Mouth Rinse, 10 times per day, Rush Farmer, MD, 15 mL at 03/23/19 1421 .  metoprolol tartrate (LOPRESSOR) injection 5 mg, 5 mg, Intravenous, Q4H PRN, Arrien, Jimmy Picket, MD, 5 mg at 03/15/19 0536 .  multivitamin liquid 15 mL, 15 mL, Per Tube, Daily, Brand Males, MD, 15 mL at 03/23/19 1105 .  nutrition supplement (JUVEN) (JUVEN) powder packet 1 packet, 1 packet, Per Tube, BID BM, Julian Hy, DO, 1 packet at 03/23/19 1429 .  ondansetron (ZOFRAN) injection 4 mg, 4 mg, Intravenous, Q6H PRN, Candee Furbish, MD, 4 mg at 03/08/19 9794 .  polyethylene glycol (MIRALAX / GLYCOLAX) packet 17 g, 17 g, Per Tube, Daily, Skeet Simmer, RPH, 17 g at 03/23/19 1105 .  polyethylene glycol (MIRALAX / GLYCOLAX) packet 17 g, 17 g, Oral, Daily PRN, Amin, Ankit Chirag, MD, 17 g at 03/22/19 1719 .  senna-docusate (Senokot-S) tablet 1 tablet, 1 tablet, Per Tube, BID, Masters, Jake Church, RPH, 1 tablet at 03/23/19 1105 .  senna-docusate (Senokot-S) tablet 2 tablet, 2 tablet, Oral, QHS PRN, Amin, Ankit Chirag, MD .  sodium chloride flush (NS) 0.9 % injection 10-40 mL, 10-40 mL, Intracatheter, Q12H, Candee Furbish, MD, 10 mL at 03/23/19 1109 .  sodium chloride flush (NS) 0.9 % injection 10-40 mL, 10-40 mL, Intracatheter, PRN, Candee Furbish, MD, 10 mL at 03/11/19 1359 .  thiamine tablet 100 mg, 100 mg, Per Tube, Daily, Rush Farmer, MD, 100 mg at 03/23/19 1106 .  zinc sulfate capsule 220 mg, 220 mg, Per Tube, Daily, Rush Farmer, MD, 220 mg at 03/23/19 1109   Patients Current Diet:     Diet Order     None         Precautions / Restrictions Precautions Precautions: Fall, Other (comment) Precaution Comments: 28% trach collar Restrictions Weight Bearing Restrictions: No    Has the patient had 2 or more falls or a fall with injury in the past year? No   Prior Activity Level Community (5-7x/wk): independent, driving, no DME used    Prior Functional Level Self Care: Did the patient need help bathing, dressing, using the toilet or eating? Independent   Indoor Mobility: Did the patient need assistance with walking from room to room (with or without device)? Independent   Stairs: Did the patient need assistance with internal or external stairs (with or without device)? Independent   Functional Cognition: Did the patient need help planning regular tasks such as shopping or remembering to take medications? Independent   Home Assistive Devices / Equipment Home Assistive Devices/Equipment: None   Prior Device Use: Indicate devices/aids used by the patient prior to current illness, exacerbation or injury? None of the above   Current Functional Level Cognition   Arousal/Alertness: Awake/alert Overall Cognitive Status: Difficult to assess Difficult to assess due to: Tracheostomy, Non-English speaking Current Attention Level: Selective Orientation Level: Oriented to person, Oriented to situation Following Commands: Follows one step commands consistently General Comments: placed PMSV and encouraged speaking  Attention: Focused, Sustained Focused Attention: Appears intact Sustained Attention: Impaired    Extremity Assessment (includes Sensation/Coordination)   Upper Extremity Assessment: Generalized weakness, RUE deficits/detail RUE Deficits / Details: grossly 2/5 throughout, weaker triceps vs biceps RUE Sensation: (to be further assessed) RUE Coordination: decreased fine motor, decreased gross motor LUE Deficits / Details: grossly 2+/5 LUE Coordination: decreased fine motor, decreased gross motor  Lower Extremity Assessment: Defer to PT evaluation RLE Deficits / Details: Grossly 1/5 LLE Deficits / Details: Grossly 2/5     ADLs   Overall ADL's : Needs assistance/impaired Eating/Feeding: Maximal assistance, Bed level(Ice  chip ) Grooming: Oral care, Wash/dry hands, Sitting, Minimal assistance Grooming Details  (indicate cue type and reason): attempted to have pt use yonker for suctioning Upper Body Dressing : Maximal assistance, Bed level Lower Body Dressing: Total assistance, Bed level Lower Body Dressing Details (indicate cue type and reason): donning socks Functional mobility during ADLs: Maximal assistance, +2 for physical assistance, +2 for safety/equipment General ADL Comments: pt with improvements in level of alertness today, strength and command following     Mobility   Overal bed mobility: Needs Assistance Bed Mobility: Rolling Rolling: Min guard Sidelying to sit: Max assist, +2 for physical assistance, +2 for safety/equipment Supine to sit: Mod assist, +2 for physical assistance Sit to supine: +2 for physical assistance, Mod assist Sit to sidelying: Max assist, +2 for physical assistance, +2 for safety/equipment General bed mobility comments: modAx2 for helicopter transfer using pad to decrease shear on sacral wound, min A x 2 for management of LE back into bed and center trunk in bed      Transfers   Overall transfer level: Needs assistance Equipment used: Ambulation equipment used Transfer via Lift Equipment: Stedy Transfers: Sit to/from Stand Sit to Stand: +2 physical assistance, Mod assist Squat pivot transfers: Max assist, +2 physical assistance General transfer comment: used Stedy for increasing standing tolerance, stood 1x 30 sec, 1x 45mn pt with increased dizziness and fatigue requesting back to bed after second bout of standing     Ambulation / Gait / Stairs / WEmergency planning/management officer  Ambulation/Gait General Gait Details: unable     Posture / Balance Dynamic Sitting Balance Sitting balance - Comments: UE support and min guard to min assist for static sitting Balance Overall balance assessment: Needs assistance Sitting-balance support: Feet supported, Single extremity supported Sitting balance-Leahy Scale: Poor Sitting balance - Comments: UE support and min guard to min  assist for static sitting Postural control: Posterior lean Standing balance support: Bilateral upper extremity supported Standing balance-Leahy Scale: Poor Standing balance comment: stoodx2 with stedy, 1x 30 sec, 1x 264m      Special needs/care consideration BiPAP/CPAP no CPM no Continuous Drip IV no Dialysis no        Days n/a Life Vest no Oxygen 5L via 28% trach collar Special Bed yes, air mattress overlay Trach Size 6 cuffless Wound Vac (area) no      Location n/a Skin peg tube, MASD                        Bowel mgmt: incontinent Bladder mgmt: incontinent/condom cath Diabetic mgmt: yes Behavioral consideration no Chemo/radiation no    Previous Home Environment (from acute therapy documentation) Living Arrangements: Spouse/significant other  Lives With: Spouse Home Care Services: No Additional Comments: pt unable to provide   Discharge Living Setting Plans for Discharge Living Setting: Patient's home Type of Home at Discharge: House Discharge Home Layout: Two level, Bed/bath upstairs Alternate Level Stairs-Rails: Right Alternate Level Stairs-Number of Steps: 12 Discharge Home Access: Stairs to enter Entrance Stairs-Rails: Can reach both Entrance Stairs-Number of Steps: 4 Discharge Bathroom Shower/Tub: Walk-in shower Discharge Bathroom Toilet: Standard Discharge Bathroom Accessibility: Yes How Accessible: Accessible via walker Does the patient have any problems obtaining your medications?: Yes (Describe)(not a citizen)   Social/Family/Support Systems Patient Roles: Spouse, Parent Anticipated Caregiver: JoVerdie Drowndaughter), MyJuliene Pinaspouse), also has 2 other dtrs, 5 siblings, and other family members willing to assist Anticipated Caregiver's Contact Information: JoVerdie [phone] Availability: 24/7 Discharge Plan Discussed with Primary Caregiver:  Yes Is Caregiver In Agreement with Plan?: Yes Does Caregiver/Family have Issues with Lodging/Transportation while  Pt is in Rehab?: No   Goals/Additional Needs Patient/Family Goal for Rehab: PT/OT min assist, SLP min assist Expected length of stay: 21-24 days Cultural Considerations: spanish speaking Dietary Needs: NPO with cortrack Pt/Family Agrees to Admission and willing to participate: Yes Program Orientation Provided & Reviewed with Pt/Caregiver Including Roles  & Responsibilities: Yes   Decrease burden of Care through IP rehab admission: n/a   Possible need for SNF placement upon discharge: Not anticipated. Pt with excellent family support.    Patient Condition: I have reviewed medical records from [facility], spoken with CM, and patient and daughter. I met with patient at the bedside for inpatient rehabilitation assessment.  Patient will benefit from ongoing PT, OT and SLP, can actively participate in 3 hours of therapy a day 5 days of the week, and can make measurable gains during the admission.  Patient will also benefit from the coordinated team approach during an Inpatient Acute Rehabilitation admission.  The patient will receive intensive therapy as well as Rehabilitation physician, nursing, social worker, and care management interventions.  Due to bladder management, bowel management, safety, skin/wound care, disease management, medication administration, pain management and patient education the patient requires 24 hour a day rehabilitation nursing.  The patient is currently mod +2 with mobility and basic ADLs.  Discharge setting and therapy post discharge at home with no follow up available is anticipated.  Patient has agreed to participate in the Acute Inpatient Rehabilitation Program and will admit today.   Preadmission Screen Completed By:  Michel Santee, PT, DPT 03/23/2019 3:56 PM ______________________________________________________________________   Discussed status with Dr. Ranell Patrick on 03/23/19  at 3:56 PM  and received approval for admission today.   Admission Coordinator:   Michel Santee, PT, DPT time 3:56 PM Sudie Grumbling 03/23/19     Assessment/Plan: Diagnosis: 1. Does the need for close, 24 hr/day Medical supervision in concert with the patient's rehab needs make it unreasonable for this patient to be served in a less intensive setting? Yes 2. Co-Morbidities requiring supervision/potential complications: trach, COVID illness, multiple CVAs, bilateral pneumothoraces, aortic clot, gangrene of toes 3. Due to bladder management, bowel management, safety, skin/wound care, disease management, medication administration, pain management and patient education, does the patient require 24 hr/day rehab nursing? Yes 4. Does the patient require coordinated care of a physician, rehab nurse, PT, OT, and SLP to address physical and functional deficits in the context of the above medical diagnosis(es)? Yes Addressing deficits in the following areas: balance, endurance, locomotion, strength, transferring, bowel/bladder control, bathing, dressing, feeding, grooming, toileting, cognition, speech, language, swallowing and psychosocial support 5. Can the patient actively participate in an intensive therapy program of at least 3 hrs of therapy 5 days a week? Yes 6. The potential for patient to make measurable gains while on inpatient rehab is good 7. Anticipated functional outcomes upon discharge from inpatient rehab: minA PT, minA OT, mod assist SLP 8. Estimated rehab length of stay to reach the above functional goals is: 2-3 weeks 9. Anticipated discharge destination: Home 10. Overall Rehab/Functional Prognosis: excellent     MD Signature: Leeroy Cha, MD

## 2019-03-23 NOTE — Progress Notes (Signed)
Physical Therapy Wound Treatment Patient Details  Name: Andrew Bruce MRN: [id number] Date of Birth: 1965/12/17  Today's Date: 03/23/2019 Time: 1002-1030 Time Calculation (min): 28 min  Subjective  Subjective: Pt reports increased pain during yesterday procedure so RN gave increased IV meds this session.  He is more interactive this session and mouthing responses back in spanish.   Patient and Family Stated Goals: None stated  Pain Score:    Wound Assessment  Pressure Injury 02/22/19 Coccyx Medial Unstageable - Full thickness tissue loss in which the base of the injury is covered by slough (yellow, tan, gray, green or brown) and/or eschar (tan, brown or black) in the wound bed. quarter-sized/purple  (Active)  Dressing Type Foam - Lift dressing to assess site every shift;Barrier Film (skin prep);Moist to dry 03/23/19 1100  Dressing Changed 03/23/19 1100  Dressing Change Frequency Daily 03/23/19 1100  State of Healing Other (Comment) 03/23/19 1100  Site / Wound Assessment Yellow;Pink 03/23/19 1100  % Wound base Red or Granulating 20% 03/23/19 1100  % Wound base Yellow/Fibrinous Exudate 80% 03/23/19 1100  % Wound base Black/Eschar 0% 03/23/19 1100  Peri-wound Assessment Intact;Pink 03/22/19 1341  Wound Length (cm) 4.5 cm 03/17/19 1146  Wound Width (cm) 2.5 cm 03/17/19 1146  Wound Depth (cm) 0.1 cm 03/17/19 1146  Wound Surface Area (cm^2) 11.25 cm^2 03/17/19 1146  Wound Volume (cm^3) 1.12 cm^3 03/17/19 1146  Tunneling (cm) 0 03/13/19 1843  Undermining (cm) 0 03/13/19 1843  Margins Unattached edges (unapproximated) 03/23/19 1100  Drainage Amount Minimal 03/23/19 1100  Drainage Description No odor;Sanguineous 03/23/19 1100  Treatment Debridement (Selective);Hydrotherapy (Pulse lavage) 03/23/19 1100     Santyl applied to wound bed prior to applying dressing.  Hydrotherapy Pulsed lavage therapy - wound location: Sacrum Pulsed Lavage with Suction (psi): 12 psi Pulsed Lavage with  Suction - Normal Saline Used: 1000 mL Pulsed Lavage Tip: Tip with splash shield Selective Debridement Selective Debridement - Location: Sacrum Selective Debridement - Tools Used: Forceps;Scalpel;Scissors(able to debride more adherent slough with scissors.  ) Selective Debridement - Tissue Removed: Thick, yellow slough   Wound Assessment and Plan  Wound Therapy - Assess/Plan/Recommendations Wound Therapy - Clinical Statement: Wound continues to present with adherent yellow slough and partial areas of pink granulation.  Pt continues to benefit from hydrotherapy with selective debirdement to remove adherent slough and promote granulation.  Will continue to decrease necriosis and bioburden in wound bed.   Wound Therapy - Functional Problem List: Global weakness s/p COVID, CVA's, aortic clot Factors Delaying/Impairing Wound Healing: Multiple medical problems;Immobility Hydrotherapy Plan: Debridement;Dressing change;Patient/family education;Pulsatile lavage with suction Wound Therapy - Frequency: 6X / week Wound Therapy - Follow Up Recommendations: Skilled nursing facility Wound Plan: See above  Wound Therapy Goals- Improve the function of patient's integumentary system by progressing the wound(s) through the phases of wound healing (inflammation - proliferation - remodeling) by: Decrease Necrotic Tissue to: 0 Decrease Necrotic Tissue - Progress: Progressing toward goal Increase Granulation Tissue to: 100 Increase Granulation Tissue - Progress: Progressing toward goal Goals/treatment plan/discharge plan were made with and agreed upon by patient/family: Yes Time For Goal Achievement: 7 days Wound Therapy - Potential for Goals: Good  Goals will be updated until maximal potential achieved or discharge criteria met.  Discharge criteria: when goals achieved, discharge from hospital, MD decision/surgical intervention, no progress towards goals, refusal/missing three consecutive treatments without  notification or medical reason.  GP     Raychelle Hudman Eli Hose 03/23/2019, 11:55 AM Rolanda Campa R. , PTA Acute Rehabilitation Services  Pager [phone] Office [phone]

## 2019-03-24 ENCOUNTER — Inpatient Hospital Stay (HOSPITAL_COMMUNITY): Payer: Self-pay | Admitting: Occupational Therapy

## 2019-03-24 ENCOUNTER — Inpatient Hospital Stay (HOSPITAL_COMMUNITY): Payer: Self-pay

## 2019-03-24 ENCOUNTER — Encounter (HOSPITAL_COMMUNITY): Payer: Self-pay | Admitting: Physical Medicine and Rehabilitation

## 2019-03-24 ENCOUNTER — Other Ambulatory Visit: Payer: Self-pay

## 2019-03-24 ENCOUNTER — Inpatient Hospital Stay (HOSPITAL_COMMUNITY): Payer: Self-pay | Admitting: Speech Pathology

## 2019-03-24 ENCOUNTER — Inpatient Hospital Stay (HOSPITAL_COMMUNITY): Payer: Medicaid Other

## 2019-03-24 DIAGNOSIS — Z930 Tracheostomy status: Secondary | ICD-10-CM

## 2019-03-24 DIAGNOSIS — L89304 Pressure ulcer of unspecified buttock, stage 4: Secondary | ICD-10-CM

## 2019-03-24 DIAGNOSIS — D72825 Bandemia: Secondary | ICD-10-CM

## 2019-03-24 DIAGNOSIS — J9601 Acute respiratory failure with hypoxia: Secondary | ICD-10-CM

## 2019-03-24 DIAGNOSIS — Z931 Gastrostomy status: Secondary | ICD-10-CM

## 2019-03-24 DIAGNOSIS — D72829 Elevated white blood cell count, unspecified: Secondary | ICD-10-CM

## 2019-03-24 DIAGNOSIS — I6340 Cerebral infarction due to embolism of unspecified cerebral artery: Secondary | ICD-10-CM

## 2019-03-24 DIAGNOSIS — U071 COVID-19: Secondary | ICD-10-CM

## 2019-03-24 LAB — FERRITIN: Ferritin: 152 ng/mL (ref 24–336)

## 2019-03-24 LAB — COMPREHENSIVE METABOLIC PANEL
ALT: 76 U/L — ABNORMAL HIGH (ref 0–44)
AST: 31 U/L (ref 15–41)
Albumin: 2.4 g/dL — ABNORMAL LOW (ref 3.5–5.0)
Alkaline Phosphatase: 104 U/L (ref 38–126)
Anion gap: 9 (ref 5–15)
BUN: 21 mg/dL — ABNORMAL HIGH (ref 6–20)
CO2: 28 mmol/L (ref 22–32)
Calcium: 8.8 mg/dL — ABNORMAL LOW (ref 8.9–10.3)
Chloride: 100 mmol/L (ref 98–111)
Creatinine, Ser: 0.46 mg/dL — ABNORMAL LOW (ref 0.61–1.24)
GFR calc Af Amer: >60 mL/min (ref >60)
GFR calc non Af Amer: >60 mL/min (ref >60)
Glucose, Bld: 152 mg/dL — ABNORMAL HIGH (ref 70–99)
Potassium: 3.9 mmol/L (ref 3.5–5.1)
Sodium: 137 mmol/L (ref 135–145)
Total Bilirubin: 0.5 mg/dL (ref 0.3–1.2)
Total Protein: 6.4 g/dL — ABNORMAL LOW (ref 6.5–8.1)

## 2019-03-24 LAB — CBC WITH DIFFERENTIAL/PLATELET
Abs Immature Granulocytes: 0.1 10*3/uL — ABNORMAL HIGH (ref 0.00–0.07)
Basophils Absolute: 0.1 10*3/uL (ref 0.0–0.1)
Basophils Relative: 0 %
Eosinophils Absolute: 0.2 10*3/uL (ref 0.0–0.5)
Eosinophils Relative: 1 %
HCT: 33.6 % — ABNORMAL LOW (ref 39.0–52.0)
Hemoglobin: 10.2 g/dL — ABNORMAL LOW (ref 13.0–17.0)
Immature Granulocytes: 1 %
Lymphocytes Relative: 12 %
Lymphs Abs: 1.9 10*3/uL (ref 0.7–4.0)
MCH: 26.6 pg (ref 26.0–34.0)
MCHC: 30.4 g/dL (ref 30.0–36.0)
MCV: 87.5 fL (ref 80.0–100.0)
Monocytes Absolute: 0.3 10*3/uL (ref 0.1–1.0)
Monocytes Relative: 2 %
Neutro Abs: 13.8 10*3/uL — ABNORMAL HIGH (ref 1.7–7.7)
Neutrophils Relative %: 84 %
Platelets: 452 10*3/uL — ABNORMAL HIGH (ref 150–400)
RBC: 3.84 MIL/uL — ABNORMAL LOW (ref 4.22–5.81)
RDW: 17.3 % — ABNORMAL HIGH (ref 11.5–15.5)
WBC: 16.3 10*3/uL — ABNORMAL HIGH (ref 4.0–10.5)
nRBC: 0 % (ref 0.0–0.2)

## 2019-03-24 LAB — GLUCOSE, CAPILLARY
Glucose-Capillary: 149 mg/dL — ABNORMAL HIGH (ref 70–99)
Glucose-Capillary: 160 mg/dL — ABNORMAL HIGH (ref 70–99)
Glucose-Capillary: 173 mg/dL — ABNORMAL HIGH (ref 70–99)
Glucose-Capillary: 174 mg/dL — ABNORMAL HIGH (ref 70–99)
Glucose-Capillary: 178 mg/dL — ABNORMAL HIGH (ref 70–99)
Glucose-Capillary: 84 mg/dL (ref 70–99)
Glucose-Capillary: 85 mg/dL (ref 70–99)
Glucose-Capillary: 96 mg/dL (ref 70–99)

## 2019-03-24 LAB — RETICULOCYTES
Immature Retic Fract: 29.1 % — ABNORMAL HIGH (ref 2.3–15.9)
RBC.: 3.88 MIL/uL — ABNORMAL LOW (ref 4.22–5.81)
Retic Count, Absolute: 179.6 10*3/uL (ref 19.0–186.0)
Retic Ct Pct: 4.6 % — ABNORMAL HIGH (ref 0.4–3.1)

## 2019-03-24 LAB — IRON AND TIBC
Iron: 22 ug/dL — ABNORMAL LOW (ref 45–182)
Saturation Ratios: 6 % — ABNORMAL LOW (ref 17.9–39.5)
TIBC: 358 ug/dL (ref 250–450)
UIBC: 336 ug/dL

## 2019-03-24 LAB — FOLATE: Folate: 19.8 ng/mL (ref >5.9)

## 2019-03-24 LAB — VITAMIN B12: Vitamin B-12: 759 pg/mL (ref 180–914)

## 2019-03-24 MED ORDER — ALTEPLASE 2 MG IJ SOLR
2.0000 mg | Freq: Once | INTRAMUSCULAR | Status: AC
  Administered 2019-03-24: 2 mg
  Filled 2019-03-24: qty 2

## 2019-03-24 MED ORDER — OSMOLITE 1.5 CAL PO LIQD
355.0000 mL | Freq: Four times a day (QID) | ORAL | Status: DC
  Administered 2019-03-24 – 2019-03-25 (×3): 355 mL
  Filled 2019-03-24 (×4): qty 474

## 2019-03-24 NOTE — Evaluation (Signed)
Physical Therapy Assessment and Plan  Patient Details  Name: Andrew Bruce MRN: [id number] Date of Birth: January 30, 1965  PT Diagnosis: Abnormal posture, Abnormality of gait, Cognitive deficits, Coordination disorder, Difficulty walking, Dizziness and giddiness, Hemiplegia dominant, Hypotonia, Impaired cognition, Impaired sensation, Muscle weakness and Pain in sacrum Rehab Potential: Good ELOS: 3-4 weeks   Today's Date: 03/24/2019 PT Individual Time: 1100-1200 PT Individual Time Calculation (min): 60 min    Problem List:  Patient Active Problem List   Diagnosis Date Noted  . Embolic stroke ([place]) 16/96/7893  . Status post tracheostomy ([place])   . Pressure injury of skin 03/02/2019  . On mechanically assisted ventilation ([place])   . Goals of care, counseling/discussion   . Palliative care encounter   . ARDS (adult respiratory distress syndrome) ([place])   . Cerebral embolism with cerebral infarction 02/17/2019  . Acute respiratory failure with hypoxia ([place])   . Pneumothorax, right   . COVID-19 02/06/2019    Past Medical History: History reviewed. No pertinent past medical history. Past Surgical History:  Past Surgical History:  Procedure Laterality Date  . IR GASTROSTOMY TUBE MOD SED  03/10/2019    Assessment & Plan Clinical Impression: Patient is a 54 y.o. year old [name] (limited [place]) who was originally admitted on 02/06/19 after 10 day history of Covid 19 and progressive SOB X 48  Hours. He was treated for Covid 19 ARDS with Remdesivir and Dexamethasone. He decompensated requiring intubation on 01/24 and bilateral chest tubes placed for pneumothoraces. As sedation weaned, on 02/03 he was noted to have decreased movement on right and CT head showed large area of subacute infarct in left occipital lobe and additional smaller area in right frontal convexity. 2D echo showed EF of 55 to 60% with moderately reduced systolic function of right ventricle globally consistent with  acute cor pulmonale.  CT angio of chest done revealing LLL segmental pulmonary artery embolus, noncalcified plaque of the aortic arch, multifocal pneumonia background of emphysema and probable degree of interstitial fibrosis.  He was started on full dose Lovenox for treatment as well as IV antibiotics for LLL pneumonia. Follow-up MRI brain done on 3/05 due to ongoing encephalopathy and showed subacute infarcts in bilateral frontoparietal regions, splenium of corpus callosum on the right and most significantly left occipital lobe.  Dr. Erlinda Bruce felt the stroke was embolic in setting of Covid related hypercoagulopathy v/s vasculitis.  Gangrenous changes of the toes due to embolism being monitored.  Palliative care consulted to discuss goals of care and family elected on full scope treatment.  Tracheostomy performed 2/16 and he was slowly weaned to extubated to ATC by 02/22-->CFS #6 in place. Treated with Unasyn 2/18-2/23 for witnessed aspiration event and PEG placed by Dr. Kathlene Bruce on 02/24.  Patient also with new diagnosis diabetes with hemoglobin A1c 11.7 and was started on Levemir.  Foley was placed briefly due to urinary retention and Flomax added to help with voiding function.  He required frequent suctioning due to copious secretions however these are decreasing and he is tolerating PMSV during the day.  Hydrotherapy initiated 03/04 for treatment of unstageable sacral decub. He has been NPO due to severe dysphagia mid dysphonia and FEES done 3/9 showing fixed partially adducted right arytenoid with no movement and left arytenoid slightly more mobile with partially closed airway "putting patient at increased risk for asphyxiation especially with swallowing activity". ENT consulted for input.  Patient noted to be significantly debilitated and working on standing and standing for 1 to 2  minutes with orthostatic symptoms. Significant visual deficits reported with naming objects and able to answer basic mild/and  questions with 100% accuracy per speech therapy.  Therapy ongoing and CIR recommended due to functional decline  Patient transferred to CIR on 03/23/2019 .   Patient currently requires total with mobility secondary to muscle weakness and muscle joint tightness, decreased cardiorespiratoy endurance and decreased oxygen support, abnormal tone, decreased coordination and decreased motor planning, decreased attention to right and decreased motor planning, decreased awareness, decreased problem solving, decreased safety awareness and decreased memory, central origin and decreased sitting balance, decreased standing balance, decreased postural control, hemiplegia, decreased balance strategies and difficulty maintaining precautions.  Prior to hospitalization, patient was independent  with mobility and lived with Spouse, Family in a House home.  Home access is 2Stairs to enter.  Patient will benefit from skilled PT intervention to maximize safe functional mobility, minimize fall risk and decrease caregiver burden for planned discharge home with 24 hour assist.  Anticipate patient will benefit from follow up [facility] at discharge.  PT - End of Session Activity Tolerance: Tolerates 30+ min activity with multiple rests Endurance Deficit: Yes Endurance Deficit Description: Requires long rest breaks between activity and tolerated minimal activity on eval PT Assessment Rehab Potential (ACUTE/IP ONLY): Good PT Barriers to Discharge: Home environment access/layout;New diabetic;New oxygen;Wound Care;Trach PT Patient demonstrates impairments in the following area(s): Balance;Behavior;Edema;Endurance;Motor;Nutrition;Pain;Perception;Safety;Sensory;Skin Integrity PT Transfers Functional Problem(s): Bed Mobility;Bed to Chair;Car;Furniture;Floor PT Locomotion Functional Problem(s): Ambulation;Wheelchair Mobility;Stairs PT Plan PT Intensity: Minimum of 1-2 x/day ,45 to 90 minutes PT Frequency: 5 out of 7 days PT Duration  Estimated Length of Stay: 3-4 weeks PT Treatment/Interventions: Ambulation/gait training;Discharge planning;Functional mobility training;Psychosocial support;Therapeutic Activities;Balance/vestibular training;Disease management/prevention;Neuromuscular re-education;Skin care/wound management;Therapeutic Exercise;Wheelchair propulsion/positioning;Cognitive remediation/compensation;DME/adaptive equipment instruction;Pain management;Splinting/orthotics;UE/LE Strength taining/ROM;Community reintegration;Functional electrical stimulation;Patient/family education;Stair training;UE/LE Coordination activities PT Transfers Anticipated Outcome(s): min A using LRAD PT Locomotion Anticipated Outcome(s): min A using LRAD PT Recommendation Recommendations for Other Services: Neuropsych consult Follow Up Recommendations: Home health PT Patient destination: Home Equipment Recommended: To be determined Equipment Details: Patient does not have any equipment at home, will determine appropriate DME based on patient's progress  Skilled Therapeutic Intervention In addition to the PT evaluation below, the patient performed the following skilled PT interventions: Patient in bed on 8L/min via trach collar at 35% FiO2 upon PT arrival. Patient alert and agreeable to PT session. Patient denied pain at beginning of session. Interpreter present throughout session.  Therapeutic Activity: Bed Mobility: Patient performed rolling R/L and supine to/from sit with mod A x1 and progressed to min A x2 without use of bed rails and with HOB elevated at 30 degrees per MD orders. Provided verbal cues for pushing through elbows to sit up and lifting LEs on the bed. Patient sat EOB x2-3 min each trial, provided cues for diaphragmatic breathing in sitting to reduce work of breathing. Patient reported sustained dizziness in sitting and noted increased work of breathing each trial, SPO2 WNL. He reported moderate sharp, central chest pain during  last trial that resolved when returned to supine, RN and PA made aware.  BP during mobility: Spine: 106/78 Sitting: 106/80 Sitting x2 min: 103/67 Applied B ACE wraps from ankle-thigh for BP management with LEs in a dependent position per discussion with RN.  Supine: 106/80 Sitting: 110/85 SPO2 remained between 92-96 with mobility, HR between 100-110 bpm.   Patient required increased time for all mobility with poor awareness and attention to his R side throughout. Provided long rest breaks between mobility due to  decreased activity tolerance. Provided patient with a 16"x16" w/c and Roho cushion for pressure relief during session.   Patient in bed with Pam, PA in the room at end of session with breaks locked, bed alarm set, and all needs within reach. Bed set to moderate firmness and pulsating pressures for pressure relief, RN made aware.  Instructed pt in results of PT evaluation as detailed below, PT POC, rehab potential, rehab goals, and discharge recommendations. Additionally discussed CIR's policies regarding fall safety and use of chair alarm and/or quick release belt. Pt verbalized understanding and in agreement. Will update pt's family members as they become available.    PT Evaluation Precautions/Restrictions Precautions Precautions: Fall;Other (comment) Precaution Comments: on 8L/min via [place], PMSV at all times, PEG; orthostatic hypotension, central line in R UE, catheter in place, NPO Restrictions Weight Bearing Restrictions: No Other Position/Activity Restrictions: HOB elevated 30 degrees at all times Home Living/Prior Functioning Home Living Available Help at Discharge: Family Type of Home: House Home Access: Stairs to enter Technical brewer of Steps: 2 Entrance Stairs-Rails: Right Home Layout: One level Additional Comments: lives with his wife and his adult children, 24/7 assist available at d/c by family  Lives With: Spouse;Family Prior Function Level of  Independence: Independent with basic ADLs;Independent with gait;Independent with transfers;Independent with homemaking with ambulation  Able to Take Stairs?: Reciprically Vocation: Full time employment Vocation Requirements: worked in a [place] Alignment: Within Passenger transport manager Range of Motion: Within Functional Limits Alignment/Gaze Preference: Gaze left Tracking/Visual Pursuits: Able to track stimulus in all quads without difficulty Saccades: Within functional limits Perception Perception: Impaired Inattention/Neglect: Does not attend to right visual field Praxis Praxis: Intact  Cognition Overall Cognitive Status: Within Functional Limits for tasks assessed Arousal/Alertness: Awake/alert Orientation Level: Oriented to person Attention: Focused;Sustained Focused Attention: Appears intact Sustained Attention: Appears intact Memory: Impaired Memory Impairment: Decreased recall of new information;Decreased short term memory Decreased Short Term Memory: Verbal basic Executive Function: Sequencing;Self Monitoring Sequencing: Impaired Sequencing Impairment: Functional basic Self Monitoring: Impaired Self Monitoring Impairment: Functional basic Safety/Judgment: Impaired Comments: Patient with poor awareness of avoiding pressure on toes throughout session, required frequent cues and manual facilitation to protect patient's toes with mobility Sensation Sensation Light Touch: Impaired Detail Peripheral sensation comments: Reported B toe and foot numbness at all times that travels to his LEs up to his thighs intermittently, reports that this occured at baseline Light Touch Impaired Details: Impaired RLE;Impaired LLE Proprioception: Impaired by gross assessment Proprioception Impaired Details: Impaired RLE;Impaired RUE Coordination Gross Motor Movements are Fluid and Coordinated: No Fine Motor Movements are Fluid and Coordinated:  No Coordination and Movement Description: R hemi, decreased activity tolerance, poor sitting tolerance due to dizziness Heel Shin Test: Unable to assess due to poor sitting tolerance Motor  Motor Motor: Hemiplegia;Abnormal tone  Mobility Bed Mobility Bed Mobility: Rolling Right;Rolling Left;Supine to Sit;Sit to Supine Rolling Right: Moderate Assistance - Patient 50-74%(with HOB elevated to 30 degrees per MD orders) Rolling Left: Moderate Assistance - Patient 50-74%(with HOB elevated to 30 degrees per MD orders) Supine to Sit: Moderate Assistance - Patient 50-74%(with HOB elevated to 30 degrees per MD orders) Sit to Supine: Moderate Assistance - Patient 50-74%(with HOB elevated to 30 degrees per MD orders) Locomotion  Gait Ambulation: No(Patient dizzy and with chest pain in sitting) Wheelchair Mobility Wheelchair Mobility: No(patient dizzy and with chest pain in sitting)  Trunk/Postural Assessment  Cervical Assessment Cervical Assessment: Exceptions to WFL(forward head) Thoracic Assessment Thoracic Assessment: Exceptions to  WFL(rounded shoulders) Lumbar Assessment Lumbar Assessment: Exceptions to WFL(posterior pelvic tilt) Postural Control Postural Control: Deficits on evaluation(decreased delayed)  Balance Balance Balance Assessed: Yes Static Sitting Balance Static Sitting - Balance Support: Left upper extremity supported;Feet supported Static Sitting - Level of Assistance: 4: Min assist Static Sitting - Comment/# of Minutes: 3 minutes Extremity Assessment  RUE Assessment RUE Assessment: Exceptions to [facility] General Strength Comments: able to raise UE above head but difficulty with coordinated use; 3/5 but decreasd functional use; weak grasp - unable to push up to get into standing with right UE RUE Body System: Neuro Brunstrum levels for arm and hand: Arm Brunstrum level for arm: Stage V Relative Independence from Synergy RUE Tone RUE Tone: Within Functional Limits LUE  Assessment General Strength Comments: 4/5 RLE Assessment RLE Assessment: Exceptions to [facility] Active Range of Motion (AROM) Comments: hip and knee WFL for mobility assessed, decreased DF to neutral General Strength Comments: Unable to fomally assess due to patient unable to tolerate movement in sitting, patient is able to perofrm SLR and active movement throughout LE, grossly at least 3+/5 LLE Assessment LLE Assessment: Exceptions to [facility] Active Range of Motion (AROM) Comments: hip and knee WFL for mobility assessed, decreased DF to neutral General Strength Comments: Unable to fomally assess due to patient unable to tolerate movement in sitting, patient is able to perofrm SLR and active movement throughout LE, grossly at least 3+/5    Refer to Care Plan for Long Term Goals  Recommendations for other services: Neuropsych  Discharge Criteria: Patient will be discharged from PT if patient refuses treatment 3 consecutive times without medical reason, if treatment goals not met, if there is a change in medical status, if patient makes no progress towards goals or if patient is discharged from hospital.  The above assessment, treatment plan, treatment alternatives and goals were discussed and mutually agreed upon: by patient  Doreene Burke PT, DPT  03/24/2019, 12:42 PM

## 2019-03-24 NOTE — Progress Notes (Signed)
Initial Nutrition Assessment  DOCUMENTATION CODES:   Not applicable  INTERVENTION:  Change tube feeding to bolus: (1.5 cartons) of Osmolite 1.5 cal QID 20ml Pro-stat TID   Recommended tube feeding regimen will provide 2430 kcals, 134 grams protein, 1086 ml free water  Continue -1 packet Juven BID, each packet provides 95 calories, 2.5 grams of protein (collagen), and 9.8 grams of carbohydrate (3 grams sugar); also contains 7 grams of L-arginine and L-glutamine, 300 mg vitamin C, 15 mg vitamin E, 1.2 mcg vitamin B-12, 9.5 mg zinc, 200 mg calcium, and 1.5 g  Calcium Beta-hydroxy-Beta-methylbutyrate to support wound healing    NUTRITION DIAGNOSIS:   Inadequate oral intake related to inability to eat as evidenced by NPO status.   GOAL:   Patient will meet greater than or equal to 90% of their needs    MONITOR:   PO intake, TF tolerance, Skin, Weight trends, Labs, I & O's  REASON FOR ASSESSMENT:   Consult Enteral/tube feeding initiation and management  ASSESSMENT:   Pt was originally admitted on 02/06/19 after 10 day history of Covid 19 and progressive shortness of breath x 48  Hours. Pt admitted to CIR on 03/23/19.  1/23 Admit 1/24 Intubated 1/25 B/L chest tubes placed 2/02 CT head: multiple ischemic infarcts  2/16 S/P tracheostomy and Cortrak placement (tip in the stomach) 2/24 S/P PEG 2/25 TF being resumed  Pt with new diabetes diagnosis.   RD consulted to transition pt to bolus feedings.  Current tube feeding regimen: 21ml Pro-stat TID, free water TID, Osmolite 1.5 cal @ 73ml/hr  Medications reviewed and include: Vitamin C, Pepcid, Folvite, SSI, Levemir, MVI, Juven BID, Miralax, Senokot-S, Thiamine, Zinc sulfate  Labs reviewed.  CBGs 149-178  UOP: 1,273ml x24 hours I/O: -2,328ml since admit   Diet Order:   Diet Order            Diet NPO time specified  Diet effective now              EDUCATION NEEDS:   No education needs have been  identified at this time  Skin:  Skin Assessment: Skin Integrity Issues: Skin Integrity Issues:: Unstageable, Other (Comment) Unstageable: coccyx Other: necrotic toes  Last BM:  3/9  Height:   Ht Readings from Last 1 Encounters:  02/06/19 5\' 6"  (1.676 m)    Weight:   Wt Readings from Last 1 Encounters:  03/17/19 69.8 kg    BMI:  There is no height or weight on file to calculate BMI.  Estimated Nutritional Needs:   Kcal:  2300-2600  Protein:  120-145 grams  Fluid:  >/=2L/d   05/17/19, MS, RD, LDN RD pager number and weekend/on-call pager number located in Amion.

## 2019-03-24 NOTE — Progress Notes (Signed)
Right toes with dry gangrene     Left toes with dry gangrene

## 2019-03-24 NOTE — Progress Notes (Signed)
Orthopedic Tech Progress Note Patient Details:  Andrew Bruce 23-Jan-1965 [id number] Night shift ORTHO TECH called in order to [employer] for a DARCO OFFLOADING SHOE, this morning I was called by [employer] [name] asking to apply our [facility] because they did not have they type the MD was requesting. Ortho Devices Type of Ortho Device: Darco shoe Ortho Device/Splint Location: bilateral legs Ortho Device/Splint Interventions: Application   Post Interventions Patient Tolerated: Well Instructions Provided: Care of device   Donald Pore 03/24/2019, 10:55 AM

## 2019-03-24 NOTE — Plan of Care (Signed)
  Problem: Consults Goal: RH GENERAL PATIENT EDUCATION Description: See Patient Education module for education specifics. Outcome: Progressing Goal: Skin Care Protocol Initiated - if Braden Score 18 or less Description: If consults are not indicated, leave blank or document N/A Outcome: Progressing Goal: Nutrition Consult-if indicated Outcome: Progressing Goal: Diabetes Guidelines if Diabetic/Glucose > 140 Description: If diabetic or lab glucose is > 140 mg/dl - Initiate Diabetes/Hyperglycemia Guidelines & Document Interventions  Outcome: Progressing   Problem: RH BOWEL ELIMINATION Goal: RH STG MANAGE BOWEL WITH ASSISTANCE Description: STG Manage Bowel with mod I Assistance. Outcome: Progressing Flowsheets (Taken 03/24/2019 1627) STG: Pt will manage bowels with assistance: 3-Moderate assistance   Problem: RH BLADDER ELIMINATION Goal: RH STG MANAGE BLADDER WITH ASSISTANCE Description: STG Manage Bladder With mod I Assistance Outcome: Progressing Flowsheets (Taken 03/24/2019 1627) STG: Pt will manage bladder with assistance: 1-Total assistance   Problem: RH SKIN INTEGRITY Goal: RH STG SKIN FREE OF INFECTION/BREAKDOWN Description: No new areas of break down while on CIR Outcome: Progressing Goal: RH STG MAINTAIN SKIN INTEGRITY WITH ASSISTANCE Description: STG Maintain Skin Integrity With mod I Assistance. Outcome: Progressing Flowsheets (Taken 03/24/2019 1627) STG: Maintain skin integrity with assistance: 1-Total assistance   Problem: RH SAFETY Goal: RH STG ADHERE TO SAFETY PRECAUTIONS W/ASSISTANCE/DEVICE Description: STG Adhere to Safety Precautions With mod I Assistance/Device. Outcome: Progressing Flowsheets (Taken 03/24/2019 1627) STG:Pt will adhere to safety precautions with assistance/device: 2-Maximum assistance   Problem: RH PAIN MANAGEMENT Goal: RH STG PAIN MANAGED AT OR BELOW PT'S PAIN GOAL Description: Pain goal of <3/10 Outcome: Progressing   Problem: RH  KNOWLEDGE DEFICIT GENERAL Goal: RH STG INCREASE KNOWLEDGE OF SELF CARE AFTER HOSPITALIZATION Outcome: Progressing

## 2019-03-24 NOTE — Progress Notes (Signed)
Pt arrived to unit. Report received from day shift nurse Hilda Lias, RN.

## 2019-03-24 NOTE — Evaluation (Signed)
Speech Language Pathology Assessment and Plan  Patient Details  Name: Andrew Bruce MRN: [id number] Date of Birth: 03/11/1965  SLP Diagnosis: Voice disorder;Dysphagia;Speech and Language deficits;Cognitive Impairments  Rehab Potential: Good ELOS: 3-4 weeks    Today's Date: 03/24/2019 SLP Individual Time: 0910-1000 SLP Individual Time Calculation (min): 50 min   Problem List:  Patient Active Problem List   Diagnosis Date Noted  . Embolic stroke ([place]) 78/67/6720  . Status post tracheostomy ([place])   . Pressure injury of skin 03/02/2019  . On mechanically assisted ventilation ([place])   . Goals of care, counseling/discussion   . Palliative care encounter   . ARDS (adult respiratory distress syndrome) ([place])   . Cerebral embolism with cerebral infarction 02/17/2019  . Acute respiratory failure with hypoxia ([place])   . Pneumothorax, right   . COVID-19 02/06/2019   Past Medical History: History reviewed. No pertinent past medical history. Past Surgical History:  Past Surgical History:  Procedure Laterality Date  . IR GASTROSTOMY TUBE MOD SED  03/10/2019    Assessment / Plan / Recommendation Clinical Impression 54 yo male admitted 02/06/19 worsening shortness of breath x2 days in the context of a 10-day Covid illness. PMH unremarkable. Pt with extended intubation, trach placement 2/16.   Acute Speech therapy intervention includes: PMSV eval 2/23, PEG 2/24, BSE/SLE 2/26 = aphonic with PMSV. visual deficits, MBS 3/4, FEES 3/9 = NPO.  Please refer to acute care SLP notes for informative details.  Dysphagia:  Limited BSE given completion of FEES yesterday. Oral care was completed with suction, which caused pt to gag. No po trials were given at this time, given high aspiration risk. FEES results indicated "fixed, partially adducted, position of right arytenoid, no movement (ad/ab duction) with commands to phonate, cough, bear down or quickly inhale/sniff. Left arytenoid is more open and  slightly more mobile. Pt has enough breath support to eject laryngeal secretions, but again there is no adduction with cough, decreasing efficacy. Airway is partially closed putting pt at increased risk of asphyxiation, particularly with swallowing activities. Dysphagia is still moderate to severe due to limited contraction of the right pharyngeal wall, decreased epiglottic deflection and poor UES opening". Will continue NPO status, and begin pharyngeal strengthening and respiratory training to maximize function of swallowing musculature to allow return to po intake.   SLP introduced EMST 150 device with verbal instruction for use. Pt able to return demonstration, but at 30 cm H20 (lowest pressure setting on device) pt demonstrates max effort with only 3-4 successful repetitions. Pt has decreased labial seal on device, with air escaping through lips and via nose. Fuction did not improve with nose clips. Given that pt was successful with Respironics device at 20 cm H20, pt should continue with that device (30 reps 3x/day). Also introduced IMST device, pt required increased verbal cues to coordinate inspiratory flow through device, successful x3 with 8 cm H20. Pt verbalized fatigue. Will need ongoing instruction to achieve independence for HEP. Pt would benefit from [facility] for baseline measurement of function. Will address in f/u sessions.  Cognitive-Linguistics: In-person interpreter was present during this assessment, and facilitated administration of the Mini-Mental State Examination (MMSE). Pt reports having a 3rd grade education, and scored 14/27 points (reading, writing, and figure drawing tasks were not administered due to visual deficits). Points were lost on Orientation, Attention, delayed recall, sentence length repetition, and following complex commands. Skilled ST intervention will target orientation and communication of wants and needs, given that pt is aphonic.  PMSV: Pt currently  tolerates  PMSV placement during all waking hours, but is unable to achieve voicing due to vocal fold immobility. His trach is cuffless. Will continue to educate regarding donning and doffing of valve in future sessions.    Skilled Therapeutic Interventions          Cognitive-Linguistic Evaluation, Limited BSE, assessment of tolerance of PMSV (evaluation completed on acute)   SLP Assessment  Patient will need skilled Speech Language Pathology Services during CIR admission    Recommendations  Patient may use Passy-Muir Speech Valve: During all therapies with supervision;During all waking hours (remove during sleep) PMSV Supervision: Intermittent MD: Please consider changing trach tube to : Smaller size SLP Diet Recommendations: NPO;Alternative means - long-term Medication Administration: Via alternative means Oral Care Recommendations: Oral care BID Patient destination: Home Follow up Recommendations: Home Health SLP Equipment Recommended: Other (comment)(PMSV)    SLP Frequency 3 to 5 out of 7 days   SLP Duration  SLP Intensity  SLP Treatment/Interventions 3-4 weeks  Minumum of 1-2 x/day, 30 to 90 minutes  Functional tasks;Patient/family education;Speech/Language facilitation;Therapeutic Activities;Dysphagia/aspiration precaution training;Multimodal communication approach;Internal/external aids;Cueing hierarchy;Oral motor exercises;Cognitive remediation/compensation    Pain Pain Assessment Pain Scale: Faces Faces Pain Scale: Hurts a little bit(grimacing with oral care) Pain Type: Acute pain  Prior Functioning Cognitive/Linguistic Baseline: Within functional limits Type of Home: House  Lives With: Spouse;Family Available Help at Discharge: Family Education: third grade Vocation: Full time employment  SLP Evaluation Cognition Overall Cognitive Status: Impaired/Different from baseline Arousal/Alertness: Awake/alert Orientation Level: Oriented to person;Disoriented to time;Disoriented  to situation;Oriented to place Attention: Focused;Sustained Focused Attention: Appears intact Sustained Attention: Appears intact Memory: Impaired Memory Impairment: Decreased recall of new information;Decreased short term memory;Storage deficit;Retrieval deficit Decreased Short Term Memory: Verbal basic Problem Solving: Impaired Problem Solving Impairment: Verbal basic Executive Function: Sequencing;Self Monitoring Sequencing: Impaired Sequencing Impairment: Functional basic Self Monitoring: Impaired Self Monitoring Impairment: Functional basic Safety/Judgment: Impaired Comments: Patient with poor awareness of avoiding pressure on toes throughout session, required frequent cues and manual facilitation to protect patient's toes with mobility  Comprehension Auditory Comprehension Overall Auditory Comprehension: Impaired Yes/No Questions: Within Functional Limits Commands: Impaired One Step Basic Commands: 75-100% accurate Two Step Basic Commands: 75-100% accurate Multistep Basic Commands: 50-74% accurate Conversation: Simple Interfering Components: Visual impairments;Working Field seismologist: Repetition;Visual/Gestural cues Reading Comprehension Reading Status: Not tested Expression Expression Primary Mode of Expression: Verbal Verbal Expression Overall Verbal Expression: Impaired Initiation: No impairment Automatic Speech: Name;Social Response Level of Generative/Spontaneous Verbalization: Word Repetition: Impaired Level of Impairment: Sentence level Naming: No impairment Pragmatics: No impairment Interfering Components: (aphonic) Written Expression Dominant Hand: Right Written Expression: Not tested Oral Motor Oral Motor/Sensory Function Overall Oral Motor/Sensory Function: Within functional limits Motor Speech Overall Motor Speech: Appears within functional limits for tasks assessed Phonation: Aphonic Intelligibility: Intelligibility reduced Word:  75-100% accurate Phrase: Not tested Sentence: Not tested Conversation: Not tested   PMSV Assessment  PMSV Trial PMSV was placed for: 60 minutes Able to redirect subglottic air through upper airway: Yes Able to Attain Phonation: No Voice Quality: Aphonic Able to Expectorate Secretions: No attempts Level of Secretion Expectoration with PMSV: Not observed Breath Support for Phonation: Moderately decreased Intelligibility: Intelligibility reduced Word: 75-100% accurate Phrase: Not tested Sentence: Not tested Conversation: Not tested Respirations During Trial: (WNL) SpO2 During Trial: (WNL) Pulse During Trial: (WNL) Behavior: Alert;Controlled;Cooperative;Responsive to questions;Good eye contact  Bedside Swallowing Assessment General Date of Onset: 02/06/19 Previous Swallow Assessment: MBS 03/18/19, FEES 03/23/19 - NPO with PEG Diet Prior to this Study: NPO  Temperature Spikes Noted: No Respiratory Status: Trach Trach Size and Type: #6;Uncuffed;With PMSV in place History of Recent Intubation: Yes Length of Intubations (days): 22 days Date extubated: 03/02/19 Behavior/Cognition: Alert;Cooperative;Pleasant mood Oral Cavity - Dentition: Adequate natural dentition Self-Feeding Abilities: (PO not given at this time, due to significant risk of aspiration/asphixiation) Patient Positioning: Partially reclined Baseline Vocal Quality: Aphonic Volitional Cough: (strong air movement, no phonation) Volitional Swallow: Able to elicit  Oral Care Assessment Does patient have any of the following "high(er) risk" factors?: Tracheostomy with trach collar 24 hrs./day;Diet - patient on tube feedings Patient is HIGHER RISK:  Mechanically ventilated (Intubated or Tracheostomy): Order set for Adult Oral Care Protocol initiated - "Higher Risk Patients:  Mechanically Ventilated" option selected (see row information) Ice Chips Ice chips: Not tested Thin Liquid Thin Liquid: Not tested Nectar Thick Nectar  Thick Liquid: Not tested Honey Thick Honey Thick Liquid: Not tested Puree Puree: Not tested Solid Solid: Not tested BSE Assessment Risk for Aspiration Impact on safety and function: Severe aspiration risk Other Related Risk Factors: Deconditioning;Decreased respiratory status;Decreased management of secretions;Prolonged intubation;Tracheostomy;Cognitive impairment  Short Term Goals: Week 1: SLP Short Term Goal 1 (Week 1): Pt will complete oropharyngeal strengthening exercises to maximize swallow function and safety for return to safe PO intake SLP Short Term Goal 2 (Week 1): Pt will complete 5 repetitions using EMST 150 device with mod A verbal instruction, at 30 cm H20. SLP Short Term Goal 3 (Week 1): Pt will complete 5 repetitions using IMST device, with mod A verbal instruction to coordinate inspiratory flow through device, at 10 cm H20. SLP Short Term Goal 4 (Week 1): Pt will communicate basic wants and needs with Mod A verbal cues SLP Short Term Goal 5 (Week 1): Pt will demonstrate orientation x4 with mod A multimodal cues SLP Short Term Goal 6 (Week 1): Pt will demonstrate donning and doffing of PMSV with mod A multimodal cues  Refer to Care Plan for Long Term Goals  Recommendations for other services: None   Discharge Criteria: Patient will be discharged from SLP if patient refuses treatment 3 consecutive times without medical reason, if treatment goals not met, if there is a change in medical status, if patient makes no progress towards goals or if patient is discharged from hospital.  The above assessment, treatment plan, treatment alternatives and goals were discussed and mutually agreed upon: by patient  Cortlan Dolin B. Quentin Ore, [facility], CCC-SLP Speech Language Pathologist  Shonna Chock 03/24/2019, 2:07 PM

## 2019-03-24 NOTE — Progress Notes (Signed)
Social Work Patient ID: Andrew Bruce, male   DOB: November 08, 1965, 54 y.o.   MRN: [id number]   Pt is uninsured; only emergency Medicaid. Pt will need charity care assistance with DME and HH (if needed).   Cecile Sheerer, MSW, LCSWA Office: [phone] Cell: [phone] Fax: [phone]

## 2019-03-24 NOTE — Progress Notes (Signed)
Physical Therapy Wound Treatment Patient Details  Name: Andrew Bruce MRN: [id number] Date of Birth: 05/27/1965  Today's Date: 03/24/2019 PT Individual Time: 1445-1520 PT Individual Time Calculation (min): 35 min   Patient Active Problem List   Diagnosis Date Noted  . Embolic stroke ([place]) 89/38/1017  . Status post tracheostomy ([place])   . Pressure injury of skin 03/02/2019  . On mechanically assisted ventilation ([place])   . Goals of care, counseling/discussion   . Palliative care encounter   . ARDS (adult respiratory distress syndrome) ([place])   . Cerebral embolism with cerebral infarction 02/17/2019  . Acute respiratory failure with hypoxia ([place])   . Pneumothorax, right   . COVID-19 02/06/2019   History reviewed. No pertinent past medical history. Past Surgical History:  Procedure Laterality Date  . IR GASTROSTOMY TUBE MOD SED  03/10/2019    Subjective  Subjective: pt asked for pain meds at end of the treatment Patient and Family Stated Goals: None stated Prior Treatments: treatment on acute  Pain Score:  4/10  Pt asking for pain med at end of session  Objective  Cognition: WFL Communication: Impaired:  Mobility: Impaired:  ROM: Impaired:  Sensation: diminished  Protective sensation (10g): not tested Signs of venous insufficiency:N/A Signs of arterial insufficiency: N/A  Wound Assessment  Pressure Injury 02/22/19 Coccyx Medial Unstageable - Full thickness tissue loss in which the base of the injury is covered by slough (yellow, tan, gray, green or brown) and/or eschar (tan, brown or black) in the wound bed. quarter-sized/purple  (Active)  Wound Image   03/17/19 1146  Dressing Type Foam - Lift dressing to assess site every shift;Barrier Film (skin prep);Moist to dry 03/24/19 1531  Dressing Changed 03/24/19 1531  Dressing Change Frequency Daily 03/24/19 1531  State of Healing Other (Comment) 03/24/19 1531  Site / Wound Assessment Yellow;Pink 03/24/19 1531  % Wound base  Red or Granulating 20% 03/24/19 1531  % Wound base Yellow/Fibrinous Exudate 80% 03/24/19 1531  % Wound base Black/Eschar 0% 03/24/19 1531  % Wound base Other/Granulation Tissue (Comment) 0% 03/24/19 1531  Peri-wound Assessment Intact 03/23/19 2122  Wound Length (cm) 5 cm 03/24/19 1531  Wound Width (cm) 2 cm 03/24/19 1531  Wound Depth (cm) 0.7 cm 03/24/19 1531  Wound Surface Area (cm^2) 10 cm^2 03/24/19 1531  Wound Volume (cm^3) 7 cm^3 03/24/19 1531  Tunneling (cm) 0 03/23/19 2122  Undermining (cm) 0 03/23/19 2122  Margins Unattached edges (unapproximated) 03/24/19 1531  Drainage Amount Moderate 03/24/19 1531  Drainage Description Sanguineous;No odor 03/24/19 1531  Treatment Cleansed;Debridement (Selective);Hydrotherapy (Pulse lavage);Packing (Saline gauze) 03/24/19 1531      Hydrotherapy Pulsed lavage therapy - wound location: Sacrum Pulsed Lavage with Suction (psi): 12 psi Pulsed Lavage with Suction - Normal Saline Used: 1000 mL Pulsed Lavage Tip: Tip with splash shield Selective Debridement Selective Debridement - Location: Sacrum Selective Debridement - Tools Used: Forceps;Scalpel;Scissors(able to debride more adherent slough with scissors.  ) Selective Debridement - Tissue Removed: Thick, tenacious, yellow eschar   Wound Assessment and Plan  Wound Therapy - Assess/Plan/Recommendations Wound Therapy - Clinical Statement: Wound continues to present with adherent yellow eschar and partial areas of pink granulation.  Pt continues to benefit from hydrotherapy for decreasing bio burden and for selective debridement to remove adherent eshar and promote granulation.  Goal is to continue to decrease necriosis and bioburden in wound bed.   Wound Therapy - Functional Problem List: Global weakness s/p COVID, CVA's, aortic clot Factors Delaying/Impairing Wound Healing: Multiple medical problems;Immobility Hydrotherapy Plan:  Debridement;Dressing change;Patient/family education;Pulsatile lavage  with suction Wound Therapy - Frequency: 6X / week Wound Therapy - Follow Up Recommendations: Home health RN Wound Plan: See above  Wound Therapy Goals- Improve the function of patient's integumentary system by progressing the wound(s) through the phases of wound healing (inflammation - proliferation - remodeling) by: Decrease Necrotic Tissue to: 10 Decrease Necrotic Tissue - Progress: Goal set today Increase Granulation Tissue to: 90 Increase Granulation Tissue - Progress: Goal set today Goals/treatment plan/discharge plan were made with and agreed upon by patient/family: Yes Time For Goal Achievement: 7 days Wound Therapy - Potential for Goals: Good  Goals will be updated until maximal potential achieved or discharge criteria met.  Discharge criteria: when goals achieved, discharge from hospital, MD decision/surgical intervention, no progress towards goals, refusal/missing three consecutive treatments without notification or medical reason.  [name] Lyanna Blystone 03/24/2019, 3:38 PM

## 2019-03-24 NOTE — Progress Notes (Signed)
PHYSICAL MEDICINE & REHABILITATION PROGRESS NOTE   Subjective/Complaints:  Pt denies pain- denies increased SOB/DOE- admits he's SOB, but stable.   Said slept OK Overnight.   Has trach and not able to tolerate a PMV at this time.      ROS: Pt denies CP, increased SOB, abd pain, N/V/C/D  Objective:   No results found. Recent Labs    03/22/19 0235 03/24/19 0458  WBC 10.9* 16.3*  HGB 10.4* 10.2*  HCT 34.2* 33.6*  PLT 472* 452*   Recent Labs    03/22/19 0235 03/24/19 0458  NA 137 137  K 4.0 3.9  CL 100 100  CO2 28 28  GLUCOSE 146* 152*  BUN 23* 21*  CREATININE 0.33* 0.46*  CALCIUM 8.7* 8.8*    Intake/Output Summary (Last 24 hours) at 03/24/2019 1654 Last data filed at 03/24/2019 1230 Gross per 24 hour  Intake 65 ml  Output 2450 ml  Net -2385 ml     Physical Exam: Vital Signs Blood pressure 116/76, pulse 100, temperature 98.1 F (36.7 C), resp. rate 20, SpO2 96 %.  Nursing note and vitals reviewed. Constitutional: pt awake, alert, speaking a few words/mouthing them in spanish and english; laying supine in bed, NAD Throat: trach in place; O2 via trach- sats 97% when I entered room Cardiovascular: tachcyardic at 108; regular rhythm Respiratory: coarse breath sounds throughout- decreased breath sounds at bases  GI: soft, NT, ND, (+)PEG site- with dry dressing, hypoactive BS.   Musculoskeletal:        General: No edema.     Comments: Multiple toes with dry gangrene.  Diffuse weakness without focal deficits 4/5 in bilateral UE and 3/5 in bilateral LE.  Neurological: He is alert.  Aphonic speech at this point- mouths words only.  Skin: He is not diaphoretic.  Sacral decub covered with foam dressing.   gangrenous toes- but covered with dry dressings.    Assessment/Plan: 1. Functional deficits secondary to embolic stroke secondary to COVID and gangrenous toes which require 3+ hours per day of interdisciplinary therapy in a comprehensive inpatient  rehab setting.  Physiatrist is providing close team supervision and 24 hour management of active medical problems listed below.  Physiatrist and rehab team continue to assess barriers to discharge/monitor patient progress toward functional and medical goals  Care Tool:  Bathing    Body parts bathed by patient: Right arm, Left arm, Chest, Abdomen, Face   Body parts bathed by helper: Front perineal area, Buttocks, Right upper leg, Left upper leg, Right lower leg, Left lower leg     Bathing assist Assist Level: Moderate Assistance - Patient 50 - 74%     Upper Body Dressing/Undressing Upper body dressing   What is the patient wearing?: [place] only    Upper body assist      Lower Body Dressing/Undressing Lower body dressing      What is the patient wearing?: Hospital gown only, Incontinence brief     Lower body assist Assist for lower body dressing: Total Assistance - Patient < 25%     Toileting Toileting Toileting Activity did not occur Landscape architect and hygiene only): N/A (no void or bm)(foley)  Toileting assist       Transfers Chair/bed transfer  Transfers assist  Chair/bed transfer activity did not occur: Safety/medical concerns(limited by dizziness, chest pain)  Chair/bed transfer assist level: Maximal Assistance - Patient 25 - 49%     Locomotion Ambulation   Ambulation assist   Ambulation activity did  not occur: Safety/medical concerns(limited by dizziness, chest pain)          Walk 10 feet activity   Assist  Walk 10 feet activity did not occur: Safety/medical concerns(limited by dizziness, chest pain)        Walk 50 feet activity   Assist Walk 50 feet with 2 turns activity did not occur: Safety/medical concerns(limited by dizziness, chest pain)         Walk 150 feet activity   Assist Walk 150 feet activity did not occur: Safety/medical concerns(limited by dizziness, chest pain)         Walk 10 feet on uneven  surface  activity   Assist Walk 10 feet on uneven surfaces activity did not occur: Safety/medical concerns(limited by dizziness, chest pain)         Wheelchair     Assist     Wheelchair activity did not occur: Safety/medical concerns(limited by dizziness, chest pain)         Wheelchair 50 feet with 2 turns activity    Assist    Wheelchair 50 feet with 2 turns activity did not occur: Safety/medical concerns(limited by dizziness, chest pain)       Wheelchair 150 feet activity     Assist  Wheelchair 150 feet activity did not occur: Safety/medical concerns(limited by dizziness, chest pain)       Blood pressure 116/76, pulse 100, temperature 98.1 F (36.7 C), resp. rate 20, SpO2 96 %.  1.  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              -patient may shower             -ELOS/Goals: 2-3 weeks minA in PT, OT, SLP 2.  Antithrombotics: -DVT/anticoagulation:  Pharmaceutical: Other (comment)--Eliquis             -antiplatelet therapy: N/A 3. Pain Management: Fentanyl IV prior to hydrotherapy. Will add oxycodone prn for sacral pain.  4. Mood: LCSW to follow for evaluation and support.              -antipsychotic agents: N/A 5. Neuropsych: This patient appears to be capable of making decisions on his own behalf. 6. Sacral decubitus/Skin/Wound Care: continue air mattress overlay. Hydrotherapy 6 days a week. Tube feeds with additional protein supplement and vitamins to help promote wound healing.  7. Fluids/Electrolytes/Nutrition: Monitor I/O. Check lytes in am and weekly.  8. T2DM: Hgb A1c- 11.7--continue Levemir bid with SSI every 4 hours for elevated BS. Will consult dietician to start bolus tube feeds.    CBG (last 3)  Recent Labs    03/24/19 0828 03/24/19 1234 03/24/19 1638  GLUCAP 178* 174* 96   3/10- adequate BGs- will monitor for trend before changing.  9. Dizziness: Will change Cardura to bedtime to  avoid SE. Will monitor orthostatic BPs 10. 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  12. Resting tachycardia: Due to deconditioning. No BB due to soft BP/orthostatic symptoms.  Monitor for now.  13. Gangrenous changes bilateral feet: Will order off loading shoes. Continue to monitor.  14. Abnormal LFTs: Recheck labs in am.   3/10- mild elevation of ALT only- will con't to monitor 15. ABLA: Due to critical illness. Will check anemia panel.   3/10- looking better- con't treatment 16. Right Vocal fold immobility, dysphonia:s/p transnasal fiberoptic laryngoscopy by ENT on 3/9 but was aborted due to gagging and vomiting. ENT will re-attempt in  a couple of days.  17. Leukocytosis-  3/10- pt is afebrile- no specific signs of illness, however WBC is up to 16k- will reorder labs in AM and check CXR and U/A and Cx to start.  17. Disposition: Patient lives with wife and 5 children aged 70-23 who will provide support upon discharge.     LOS: 1 days A FACE TO FACE EVALUATION WAS PERFORMED  Andrew Bruce 03/24/2019, 4:54 PM

## 2019-03-24 NOTE — Discharge Instructions (Addendum)
Inpatient Rehab Discharge Instructions  Andrew Bruce Discharge date and time: 04/20/19    Activities/Precautions/ Functional Status: Activity: activity as tolerated with supervision.  Diet: diabetic diet Wound Care: Cleanse area on buttock with normal saline/water. Apply a layer of santyl then cover with damp to dry dressing. Change daily and if soiled.    Functional status:  ___ No restrictions     ___ Walk up steps independently _X__ 24/7 supervision/assistance   ___ Walk up steps with assistance ___ Intermittent supervision/assistance  ___ Bathe/dress independently ___ Walk with walker     _X__ Bathe/dress with assistance ___ Walk Independently    ___ Shower independently ___ Walk with assistance    ___ Shower with assistance _X__ No alcohol     ___ Return to work/school ________  Special Instructions:    Outpatient: PT   OT   ST             Agency:[facility] Phone: [phone]             Appointment Date/Time: *Please expect follow-up to schedule your visit with-in 3-5 business days. If you have not received follow-up, be sure to call the location. Be sure to discuss financial assistance at time of your visit.*  Medical Equipment/Items Ordered: rolling walker                                                 Agency/Supplier: [employer] [phone]  New Patient Appointment: [facility]  Thursday, March 29 at [facility] with Thad Ranger NP  [address], [address] [place], [place] [address] [phone] *Be sure to discuss financial assistance at time of appointment.      My questions have been answered and I understand these instructions. I will adhere to these goals and the provided educational materials after my discharge from the hospital.  Patient/Caregiver Signature _______________________________ Date __________  Clinician Signature _______________________________________  Date __________  Please bring this form and your medication list with you to all your follow-up doctor's appointments. COMMUNITY REFERRALS UPON DISCHARGE:     Information on my medicine - ELIQUIS (apixaban)  This medication education was reviewed with me or my healthcare representative as part of my discharge preparation  Why was Eliquis prescribed for you? Eliquis was prescribed for you to reduce the risk of forming blood clots that can cause a stroke if you have a medical condition called atrial fibrillation (a type of irregular heartbeat) OR to reduce the risk of a blood clots forming after orthopedic surgery.  What do You need to know about Eliquis ? Take your Eliquis TWICE DAILY - one tablet in the morning and one tablet in the evening with or without food.  It would be best to take the doses about the same time each day.  If you have difficulty swallowing the tablet whole please discuss with your pharmacist how to take the medication safely.  Take Eliquis exactly as prescribed by your doctor and DO NOT  stop taking Eliquis without talking to the doctor who prescribed the medication.  Stopping may increase your risk of developing a new clot or stroke.  Refill your prescription before you run out.  After discharge, you should have regular check-up appointments with your healthcare provider that is prescribing your Eliquis.  In the future your dose may need to be changed if your kidney function or weight changes by a significant amount or as you get older.  What do you do if you miss a dose? If you miss a dose, take it as soon as you remember on the same day and resume taking twice daily.  Do not take more than one dose of ELIQUIS at the same time.  Important Safety Information A possible side effect of Eliquis is bleeding. You should call your healthcare provider right away if you experience any of the following: ? Bleeding from an injury or your nose that does not  stop. ? Unusual colored urine (red or dark brown) or unusual colored stools (red or black). ? Unusual bruising for unknown reasons. ? A serious fall or if you hit your head (even if there is no bleeding).  Some medicines may interact with Eliquis and might increase your risk of bleeding or clotting while on Eliquis. To help avoid this, consult your healthcare provider or pharmacist prior to using any new prescription or non-prescription medications, including herbals, vitamins, non-steroidal anti-inflammatory drugs (NSAIDs) and supplements.  This website has more information on Eliquis (apixaban): [email or web address].

## 2019-03-24 NOTE — Progress Notes (Signed)
Orthopedic Tech Progress Note Patient Details:  Andrew Bruce 1965-12-27 [id number]  Patient ID: [name], male   DOB: 1965-05-02, 54 y.o.   MRN: [id number] Called in order to hanger   [place], [place] 03/24/2019, 12:42 AM

## 2019-03-24 NOTE — Evaluation (Signed)
Occupational Therapy Assessment and Plan  Patient Details  Name: Andrew Bruce MRN: [id number] Date of Birth: 24-Aug-1965  OT Diagnosis: cognitive deficits, hemiplegia affecting dominant side and muscle weakness (generalized) Rehab Potential: Rehab Potential (ACUTE ONLY): Good ELOS: ~3- 4 weeks   Today's Date: 03/24/2019 OT Individual Time: 1300-1415 OT Individual Time Calculation (min): 75 min     Problem List:  Patient Active Problem List   Diagnosis Date Noted  . Embolic stroke ([place]) 61/60/7371  . Status post tracheostomy ([place])   . Pressure injury of skin 03/02/2019  . On mechanically assisted ventilation ([place])   . Goals of care, counseling/discussion   . Palliative care encounter   . ARDS (adult respiratory distress syndrome) ([place])   . Cerebral embolism with cerebral infarction 02/17/2019  . Acute respiratory failure with hypoxia ([place])   . Pneumothorax, right   . COVID-19 02/06/2019    Past Medical History: History reviewed. No pertinent past medical history. Past Surgical History:  Past Surgical History:  Procedure Laterality Date  . IR GASTROSTOMY TUBE MOD SED  03/10/2019    Assessment & Plan Clinical Impression: Patient is a 54 y.o. year old male (limited [place]) who was originally admitted on 02/06/19 after 10 day history of Covid 19 and progressive SOB X 48  Hours. He was treated for Covid 19 ARDS with Remdesivir and Dexamethasone. He decompensated requiring intubation on 01/24 and bilateral chest tubes placed for pneumothoraces. As sedation weaned, on 02/03 he was noted to have decreased movement on right and CT head showed large area of subacute infarct in left occipital lobe and additional smaller area in right frontal convexity. 2D echo showed EF of 55 to 60% with moderately reduced systolic function of right ventricle globally consistent with acute cor pulmonale.  CT angio of chest done revealing LLL segmental pulmonary artery embolus, noncalcified plaque of the  aortic arch, multifocal pneumonia background of emphysema and probable degree of interstitial fibrosis.  He was started on full dose Lovenox for treatment as well as IV antibiotics for LLL pneumonia. Follow-up MRI brain done on 3/05 due to ongoing encephalopathy and showed subacute infarcts in bilateral frontoparietal regions, splenium of corpus callosum on the right and most significantly left occipital lobe.  Dr. Erlinda Hong felt the stroke was embolic in setting of Covid related hypercoagulopathy v/s vasculitis.  Gangrenous changes of the toes due to embolism being monitored.  Palliative care consulted to discuss goals of care and family elected on full scope treatment.  Tracheostomy performed 2/16 and he was slowly weaned to extubated to ATC by 02/22-->CFS #6 in place. Treated with Unasyn 2/18-2/23 for witnessed aspiration event and PEG placed by Dr. Kathlene Cote on 02/24.  Patient also with new diagnosis diabetes with hemoglobin A1c 11.7 and was started on Levemir.  Foley was placed briefly due to urinary retention and Flomax added to help with voiding function.  He required frequent suctioning due to copious secretions however these are decreasing and he is tolerating PMSV during the day.  Hydrotherapy initiated 03/04 for treatment of unstageable sacral decub. He has been NPO due to severe dysphagia mid dysphonia and FEES done 3/9 showing fixed partially adducted right arytenoid with no movement and left arytenoid slightly more mobile with partially closed airway "putting patient at increased risk for asphyxiation especially with swallowing activity". ENT consulted for input.  Patient noted to be significantly debilitated and working on standing and standing for 1 to 2 minutes with orthostatic symptoms. Significant visual deficits reported with naming objects and able  to answer basic mild/and questions with 100% accuracy per speech therapy.  Therapy ongoing and CIR recommended due to functional decline   Patient  transferred to CIR on 03/23/2019 .    Patient currently requires total with basic self-care skills and and max for basic mobiltiy  secondary to muscle weakness, decreased cardiorespiratoy endurance and decreased oxygen support, impaired timing and sequencing, unbalanced muscle activation, decreased coordination and decreased motor planning, decreased attention to right, decreased attention, decreased awareness, decreased problem solving, decreased safety awareness, decreased memory and delayed processing and decreased sitting balance, decreased standing balance, decreased postural control, hemiplegia and decreased balance strategies.  Prior to hospitalization, patient could complete ADL with independent .  Patient will benefit from skilled intervention to decrease level of assist with basic self-care skills and increase independence with basic self-care skills prior to discharge home with care partner.  Anticipate patient will require minimal physical assistance and follow up home health.  OT - End of Session Activity Tolerance: Tolerates 10 - 20 min activity with multiple rests Endurance Deficit: Yes Endurance Deficit Description: Requires long rest breaks between activity and tolerated minimal activity on eval OT Assessment Rehab Potential (ACUTE ONLY): Good OT Patient demonstrates impairments in the following area(s): Balance;Safety;Cognition;Edema;Endurance;Motor;Pain;Sensory;Perception OT Basic ADL's Functional Problem(s): Grooming;Bathing;Dressing;Toileting OT Transfers Functional Problem(s): Toilet;Tub/Shower OT Additional Impairment(s): Fuctional Use of Upper Extremity OT Plan OT Intensity: Minimum of 1-2 x/day, 45 to 90 minutes OT Frequency: 5 out of 7 days OT Duration/Estimated Length of Stay: ~3- 4 weeks OT Treatment/Interventions: Balance/vestibular training;Discharge planning;Pain management;Self Care/advanced ADL retraining;Therapeutic Activities;UE/LE Coordination  activities;Cognitive remediation/compensation;Disease mangement/prevention;Functional mobility training;Patient/family education;Skin care/wound managment;Therapeutic Exercise;Community reintegration;DME/adaptive equipment instruction;Neuromuscular re-education;Psychosocial support;Splinting/orthotics;UE/LE Strength taining/ROM;Wheelchair propulsion/positioning OT Self Feeding Anticipated Outcome(s): n/a OT Basic Self-Care Anticipated Outcome(s): min A OT Toileting Anticipated Outcome(s): min  A OT Bathroom Transfers Anticipated Outcome(s): min A OT Recommendation Recommendations for Other Services: Neuropsych consult Patient destination: Home Follow Up Recommendations: Home health OT;Outpatient OT Equipment Recommended: To be determined   Skilled Therapeutic Intervention Ot eval initiated with OT goals, purpose, and role discussed with pt. Performed bed mobility with max A to come to EOB. Pt able to maintaining sitting balance with static balance. Pt able to maintain O2 sats around 90-91 on 4 liters of nasal cannula recommended by RT. Pt transferred with max A to the w/c. Participated in bathing UB and sit to stand at sink with mod to max A. Pt required rest breaks throughout. Assisted with washing pt's hair and shaving pt with total A. Pt transferred back to the bed with max A and max A to return to supine. Pt placed back on trach collar. See below for assessment.    OT Evaluation Precautions/Restrictions  Precautions Precautions: Fall;Other (comment) Precaution Comments: on 8L/min via [place], PMSV at all times, PEG; orthostatic hypotension, central line in R UE, catheter in place, NPO Restrictions Weight Bearing Restrictions: No Other Position/Activity Restrictions: HOB elevated 30 degrees at all times General Chart Reviewed: Yes Family/Caregiver Present: No Vital Signs Therapy Vitals Pulse Rate: 97 Resp: 20 Patient Position (if appropriate): Lying Oxygen Therapy SpO2: 99 % O2 Device:  Tracheostomy Collar O2 Flow Rate (L/min): 8 L/min FiO2 (%): 35 % Pain Pain Assessment Pain Scale: Faces Faces Pain Scale: Hurts a little bit(grimacing with oral care) Pain Type: Acute pain Home Living/Prior Functioning Home Living Family/patient expects to be discharged to:: Private residence Living Arrangements: Spouse/significant other Available Help at Discharge: Family Type of Home: House Home Access: Stairs to enter [employer] of Steps: 2  Entrance Stairs-Rails: Right Home Layout: One level Additional Comments: lives with his wife , 24/7 assist available at d/c by family  Lives With: Spouse, Family IADL History Education: third grade Prior Function Level of Independence: Independent with basic ADLs, Independent with gait, Independent with transfers, Independent with homemaking with ambulation  Able to Take Stairs?: Reciprically Vocation: Full time employment Vocation Requirements: worked in [employer] ADL ADL Eating: NPO Grooming: Maximal assistance Where Assessed-Grooming: Sitting at sink Upper Body Bathing: Minimal assistance Where Assessed-Upper Body Bathing: Sitting at sink Lower Body Bathing: Maximal assistance Where Assessed-Lower Body Bathing: Sitting at sink Upper Body Dressing: (hospital gown) Lower Body Dressing: (hospital gown and socks and ACE) Toileting: Unable to assess Toilet Transfer: Unable to assess Vision Baseline Vision/History: No visual deficits Patient Visual Report: No change from baseline Vision Assessment?: Yes Eye Alignment: Within Functional Limits Ocular Range of Motion: Within Functional Limits Alignment/Gaze Preference: Gaze left Tracking/Visual Pursuits: Able to track stimulus in all quads without difficulty Saccades: Within functional limits Perception  Perception: Impaired Inattention/Neglect: Does not attend to right visual field Praxis Praxis: Intact Cognition Overall Cognitive Status: Impaired/Different from  baseline Arousal/Alertness: Awake/alert Orientation Level: Person;Place;Situation Person: Oriented Place: Oriented Situation: Oriented Year: (2019) Month: April Day of Week: Correct Memory: Impaired Memory Impairment: Decreased recall of new information;Decreased short term memory;Storage deficit;Retrieval deficit Decreased Short Term Memory: Verbal basic Immediate Memory Recall: Sock;Blue;Bed Memory Recall Sock: Not able to recall Memory Recall Blue: Not able to recall Memory Recall Bed: Not able to recall Attention: Focused;Sustained Focused Attention: Appears intact Sustained Attention: Appears intact Awareness: Impaired Awareness Impairment: Intellectual impairment;Emergent impairment Problem Solving: Impaired Problem Solving Impairment: Verbal basic Executive Function: Sequencing;Self Monitoring Sequencing: Impaired Sequencing Impairment: Functional basic Self Monitoring: Impaired Self Monitoring Impairment: Functional basic Safety/Judgment: Impaired Comments: Patient with poor awareness of avoiding pressure on toes throughout session, required frequent cues and manual facilitation to protect patient's toes with mobility Sensation Sensation Light Touch: Impaired Detail Peripheral sensation comments: Reported B toe and foot numbness at all times that travels to his LEs up to his thighs intermittently, reports that this occured at baseline Light Touch Impaired Details: Impaired RLE;Impaired LLE;Impaired RUE Proprioception: Impaired Detail Proprioception Impaired Details: Impaired RLE;Impaired RUE Coordination Gross Motor Movements are Fluid and Coordinated: No Fine Motor Movements are Fluid and Coordinated: No Coordination and Movement Description: R hemi, decreased activity tolerance, poor sitting tolerance due to dizziness Heel Shin Test: Unable to assess due to poor sitting tolerance Motor  Motor Motor: Hemiplegia;Abnormal tone Mobility  Bed Mobility Bed Mobility:  Rolling Right;Rolling Left;Supine to Sit;Sit to Supine Rolling Right: Moderate Assistance - Patient 50-74%(with HOB elevated to 30 degrees per MD orders) Rolling Left: Moderate Assistance - Patient 50-74%(with HOB elevated to 30 degrees per MD orders) Supine to Sit: Maximal Assistance - Patient - Patient 25-49% Sit to Supine: Moderate Assistance - Patient 50-74% Transfers Sit to Stand: Maximal Assistance - Patient 25-49%  Trunk/Postural Assessment  Cervical Assessment Cervical Assessment: (forward head) Thoracic Assessment Thoracic Assessment: (rounded shoulders) Lumbar Assessment Lumbar Assessment: (posterior pelvic tilt) Postural Control Postural Control: Deficits on evaluation Righting Reactions: delayed Protective Responses: delayed  Balance Balance Balance Assessed: Yes Static Sitting Balance Static Sitting - Balance Support: Left upper extremity supported;Feet supported Static Sitting - Level of Assistance: 5: Stand by assistance Static Sitting - Comment/# of Minutes: 3 minutes Static Standing Balance Static Standing - Balance Support: During functional activity Static Standing - Level of Assistance: 2: Max assist Dynamic Standing Balance Dynamic Standing - Balance  Support: During functional activity Dynamic Standing - Level of Assistance: 1: +1 Total assist Extremity/Trunk Assessment RUE Assessment RUE Assessment: Exceptions to [facility] General Strength Comments: able to raise UE above head but difficulty with coordinated use; 3/5 but decreasd functional use; weak grasp - unable to push up to get into standing with right UE RUE Body System: Neuro Brunstrum levels for arm and hand: Arm Brunstrum level for arm: Stage V Relative Independence from Synergy RUE Tone RUE Tone: Within Functional Limits LUE Assessment General Strength Comments: 4/5     Refer to Care Plan for Long Term Goals  Recommendations for other services: Neuropsych   Discharge Criteria: Patient will  be discharged from OT if patient refuses treatment 3 consecutive times without medical reason, if treatment goals not met, if there is a change in medical status, if patient makes no progress towards goals or if patient is discharged from hospital.  The above assessment, treatment plan, treatment alternatives and goals were discussed and mutually agreed upon: by patient  Nicoletta Ba 03/24/2019, 3:26 PM

## 2019-03-24 NOTE — Progress Notes (Signed)
Inpatient Rehabilitation  Patient information reviewed and entered into eRehab system by Shakura Cowing M. Emmitte Surgeon, M.A., CCC/SLP, PPS Coordinator.  Information including medical coding, functional ability and quality indicators will be reviewed and updated through discharge.    

## 2019-03-25 ENCOUNTER — Inpatient Hospital Stay (HOSPITAL_COMMUNITY): Payer: Self-pay

## 2019-03-25 ENCOUNTER — Inpatient Hospital Stay (HOSPITAL_COMMUNITY): Payer: Self-pay | Admitting: Occupational Therapy

## 2019-03-25 ENCOUNTER — Inpatient Hospital Stay (HOSPITAL_COMMUNITY): Payer: Self-pay | Admitting: Speech Pathology

## 2019-03-25 LAB — CBC WITH DIFFERENTIAL/PLATELET
Abs Immature Granulocytes: 0.07 10*3/uL (ref 0.00–0.07)
Basophils Absolute: 0 10*3/uL (ref 0.0–0.1)
Basophils Relative: 0 %
Eosinophils Absolute: 0.2 10*3/uL (ref 0.0–0.5)
Eosinophils Relative: 3 %
HCT: 33.3 % — ABNORMAL LOW (ref 39.0–52.0)
Hemoglobin: 10 g/dL — ABNORMAL LOW (ref 13.0–17.0)
Immature Granulocytes: 1 %
Lymphocytes Relative: 22 %
Lymphs Abs: 2.1 10*3/uL (ref 0.7–4.0)
MCH: 26.5 pg (ref 26.0–34.0)
MCHC: 30 g/dL (ref 30.0–36.0)
MCV: 88.1 fL (ref 80.0–100.0)
Monocytes Absolute: 0.4 10*3/uL (ref 0.1–1.0)
Monocytes Relative: 5 %
Neutro Abs: 6.4 10*3/uL (ref 1.7–7.7)
Neutrophils Relative %: 69 %
Platelets: 470 10*3/uL — ABNORMAL HIGH (ref 150–400)
RBC: 3.78 MIL/uL — ABNORMAL LOW (ref 4.22–5.81)
RDW: 17.6 % — ABNORMAL HIGH (ref 11.5–15.5)
WBC: 9.3 10*3/uL (ref 4.0–10.5)
nRBC: 0 % (ref 0.0–0.2)

## 2019-03-25 LAB — BASIC METABOLIC PANEL
Anion gap: 11 (ref 5–15)
BUN: 16 mg/dL (ref 6–20)
CO2: 28 mmol/L (ref 22–32)
Calcium: 8.5 mg/dL — ABNORMAL LOW (ref 8.9–10.3)
Chloride: 98 mmol/L (ref 98–111)
Creatinine, Ser: <0.3 mg/dL — ABNORMAL LOW (ref 0.61–1.24)
Glucose, Bld: 53 mg/dL — ABNORMAL LOW (ref 70–99)
Potassium: 3.7 mmol/L (ref 3.5–5.1)
Sodium: 137 mmol/L (ref 135–145)

## 2019-03-25 LAB — GLUCOSE, CAPILLARY
Glucose-Capillary: 54 mg/dL — ABNORMAL LOW (ref 70–99)
Glucose-Capillary: 72 mg/dL (ref 70–99)
Glucose-Capillary: 141 mg/dL — ABNORMAL HIGH (ref 70–99)
Glucose-Capillary: 55 mg/dL — ABNORMAL LOW (ref 70–99)
Glucose-Capillary: 228 mg/dL — ABNORMAL HIGH (ref 70–99)
Glucose-Capillary: 204 mg/dL — ABNORMAL HIGH (ref 70–99)
Glucose-Capillary: 127 mg/dL — ABNORMAL HIGH (ref 70–99)

## 2019-03-25 MED ORDER — POLYSACCHARIDE IRON COMPLEX 150 MG PO CAPS
150.0000 mg | ORAL_CAPSULE | Freq: Two times a day (BID) | ORAL | Status: DC
  Administered 2019-03-25 – 2019-04-20 (×53): 150 mg via ORAL
  Filled 2019-03-25 (×53): qty 1

## 2019-03-25 MED ORDER — INSULIN ASPART 100 UNIT/ML ~~LOC~~ SOLN
0.0000 [IU] | Freq: Three times a day (TID) | SUBCUTANEOUS | Status: DC
  Administered 2019-03-25 (×2): 3 [IU] via SUBCUTANEOUS
  Administered 2019-03-26: 2 [IU] via SUBCUTANEOUS
  Administered 2019-03-27: 1 [IU] via SUBCUTANEOUS
  Administered 2019-03-27 – 2019-03-28 (×3): 3 [IU] via SUBCUTANEOUS
  Administered 2019-03-28: 17:00:00 2 [IU] via SUBCUTANEOUS
  Administered 2019-03-29: 3 [IU] via SUBCUTANEOUS
  Administered 2019-03-30: 09:00:00 1 [IU] via SUBCUTANEOUS
  Administered 2019-03-30: 17:00:00 3 [IU] via SUBCUTANEOUS
  Administered 2019-03-31: 13:00:00 5 [IU] via SUBCUTANEOUS
  Administered 2019-04-01 – 2019-04-02 (×2): 2 [IU] via SUBCUTANEOUS
  Administered 2019-04-04: 1 [IU] via SUBCUTANEOUS
  Administered 2019-04-04: 2 [IU] via SUBCUTANEOUS

## 2019-03-25 MED ORDER — SENNOSIDES-DOCUSATE SODIUM 8.6-50 MG PO TABS
1.0000 | ORAL_TABLET | Freq: Every day | ORAL | Status: DC
  Administered 2019-03-26 – 2019-04-19 (×24): 1
  Filled 2019-03-25 (×25): qty 1

## 2019-03-25 MED ORDER — INSULIN DETEMIR 100 UNIT/ML ~~LOC~~ SOLN
27.0000 [IU] | Freq: Two times a day (BID) | SUBCUTANEOUS | Status: DC
  Filled 2019-03-25: qty 0.27

## 2019-03-25 MED ORDER — OSMOLITE 1.5 CAL PO LIQD
355.0000 mL | Freq: Four times a day (QID) | ORAL | Status: DC
  Administered 2019-03-25 – 2019-03-31 (×26): 355 mL
  Filled 2019-03-25 (×29): qty 474

## 2019-03-25 MED ORDER — IPRATROPIUM-ALBUTEROL 0.5-2.5 (3) MG/3ML IN SOLN
3.0000 mL | Freq: Four times a day (QID) | RESPIRATORY_TRACT | Status: DC
  Administered 2019-03-25 – 2019-03-26 (×5): 3 mL via RESPIRATORY_TRACT
  Filled 2019-03-25 (×5): qty 3

## 2019-03-25 MED ORDER — INSULIN ASPART 100 UNIT/ML ~~LOC~~ SOLN
0.0000 [IU] | Freq: Every day | SUBCUTANEOUS | Status: DC
  Administered 2019-03-27 – 2019-03-29 (×3): 2 [IU] via SUBCUTANEOUS
  Administered 2019-04-01: 23:00:00 3 [IU] via SUBCUTANEOUS
  Administered 2019-04-03: 2 [IU] via SUBCUTANEOUS

## 2019-03-25 MED ORDER — INSULIN DETEMIR 100 UNIT/ML ~~LOC~~ SOLN
27.0000 [IU] | Freq: Two times a day (BID) | SUBCUTANEOUS | Status: DC
  Administered 2019-03-25 – 2019-03-26 (×4): 27 [IU] via SUBCUTANEOUS
  Filled 2019-03-25 (×4): qty 0.27

## 2019-03-25 NOTE — Progress Notes (Signed)
[facility] PHYSICAL MEDICINE & REHABILITATION PROGRESS NOTE   Subjective/Complaints:   Pt reports no pain however does report more SOB/DOE- attempted to use tele-interpretor 3- was not able to hear interpretor at all- machine wasn't working right.   Pt also had a low BG of 55 last night- due to changing diet from continuous to bolus TFs.    ROS: Pt denies CP, abd pain, N/V/C/D- SOB as above.  Objective:   DG CHEST PORT 1 VIEW  Result Date: 03/24/2019 CLINICAL DATA:  Shortness of breath EXAM: PORTABLE CHEST 1 VIEW COMPARISON:  03/13/2019 FINDINGS: Cardiac shadow is stable. Right-sided PICC line is noted and stable. Tracheostomy tube is again seen and stable. Patchy infiltrates are identified bilaterally slightly increased when compared with the prior exam. No pneumothorax or sizable effusion is seen. IMPRESSION: Slight increase in patchy opacities bilaterally. Tubes and lines as described. Electronically Signed   By: Inez Catalina M.D.   On: 03/24/2019 19:18   Recent Labs    03/24/19 0458 03/25/19 0407  WBC 16.3* 9.3  HGB 10.2* 10.0*  HCT 33.6* 33.3*  PLT 452* 470*   Recent Labs    03/24/19 0458 03/25/19 0407  NA 137 137  K 3.9 3.7  CL 100 98  CO2 28 28  GLUCOSE 152* 53*  BUN 21* 16  CREATININE 0.46* <0.30*  CALCIUM 8.8* 8.5*    Intake/Output Summary (Last 24 hours) at 03/25/2019 1011 Last data filed at 03/25/2019 0000 Gross per 24 hour  Intake 10 ml  Output 1100 ml  Net -1090 ml     Physical Exam: Vital Signs Blood pressure 126/78, pulse 100, temperature 97.7 F (36.5 C), resp. rate 20, height 5\' 6"  (1.676 m), weight 69.8 kg, SpO2 99 %.  Nursing note and vitals reviewed. Constitutional: pt awake, alert, appropriate, using O2 via trach- wearing PMV, NAD Throat: trach in place- O2 via trach; wearing PMV-  Cardiovascular: borderline tachycardia- at 97-98- no M/R/G heard Respiratory: still coarse throughout- O2 sats 99% on O2 via trach; decreased at bases;   GI:  soft, NT; ND; (+)BS; hypoactive Musculoskeletal:        General: No edema.     Comments: 1st-4th toes on L black/gangrenous- dry skin; peeling some;  R foot 1st-3rd and 5th toes- 3rd toe less black- only underside- skin also dry/peeling some;   Diffuse weakness without focal deficits 4/5 in bilateral UE and 3/5 in bilateral LE.  Neurological: He is alert.  Aphonic speech at this point- mouths words only.  Skin: He is not diaphoretic.  Sacral decub covered with foam dressing.   gangrenous toes- but covered with dry dressings. Psych: saddened affect; but appropriate.     Assessment/Plan: 1. 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  Physiatrist is providing close team supervision and 24 hour management of active medical problems listed below.  Physiatrist and rehab team continue to assess barriers to discharge/monitor patient progress toward functional and medical goals  Care Tool:  Bathing    Body parts bathed by patient: Right arm, Left arm, Chest, Abdomen, Face   Body parts bathed by helper: Front perineal area, Buttocks, Right upper leg, Left upper leg, Right lower leg, Left lower leg     Bathing assist Assist Level: Moderate Assistance - Patient 50 - 74%     Upper Body Dressing/Undressing Upper body dressing   What is the patient  wearing?: Hospital gown only    Upper body assist      Lower Body Dressing/Undressing Lower body dressing      What is the patient wearing?: Hospital gown only, Incontinence brief     Lower body assist Assist for lower body dressing: Total Assistance - Patient < 25%     Toileting Toileting Toileting Activity did not occur Press photographer and hygiene only): N/A (no void or bm)(foley)  Toileting assist Assist for toileting: Dependent - Patient 0%     Transfers Chair/bed  transfer  Transfers assist  Chair/bed transfer activity did not occur: Safety/medical concerns(limited by dizziness, chest pain)  Chair/bed transfer assist level: Maximal Assistance - Patient 25 - 49%     Locomotion Ambulation   Ambulation assist   Ambulation activity did not occur: Safety/medical concerns(limited by dizziness, chest pain)          Walk 10 feet activity   Assist  Walk 10 feet activity did not occur: Safety/medical concerns(limited by dizziness, chest pain)        Walk 50 feet activity   Assist Walk 50 feet with 2 turns activity did not occur: Safety/medical concerns(limited by dizziness, chest pain)         Walk 150 feet activity   Assist Walk 150 feet activity did not occur: Safety/medical concerns(limited by dizziness, chest pain)         Walk 10 feet on uneven surface  activity   Assist Walk 10 feet on uneven surfaces activity did not occur: Safety/medical concerns(limited by dizziness, chest pain)         Wheelchair     Assist     Wheelchair activity did not occur: Safety/medical concerns(limited by dizziness, chest pain)         Wheelchair 50 feet with 2 turns activity    Assist    Wheelchair 50 feet with 2 turns activity did not occur: Safety/medical concerns(limited by dizziness, chest pain)       Wheelchair 150 feet activity     Assist  Wheelchair 150 feet activity did not occur: Safety/medical concerns(limited by dizziness, chest pain)       Blood pressure 126/78, pulse 100, temperature 97.7 F (36.5 C), resp. rate 20, height 5\' 6"  (1.676 m), weight 69.8 kg, SpO2 99 %.  1.  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              -patient may shower             -ELOS/Goals: 2-3 weeks minA in PT, OT, SLP 2.  Antithrombotics: -DVT/anticoagulation:  Pharmaceutical: Other (comment)--Eliquis             -antiplatelet therapy: N/A 3. Pain Management:  Fentanyl IV prior to hydrotherapy. Will add oxycodone prn for sacral pain.  4. Mood: LCSW to follow for evaluation and support.              -antipsychotic agents: N/A 5. Neuropsych: This patient appears to be capable of making decisions on his own behalf. 6. Sacral decubitus/Skin/Wound Care: continue air mattress overlay. Hydrotherapy 6 days a week. Tube feeds with additional protein supplement and vitamins to help promote wound healing.  7. Fluids/Electrolytes/Nutrition: Monitor I/O. Check lytes in am and weekly.  8. T2DM: Hgb A1c- 11.7--continue Levemir bid with SSI every 4 hours for elevated BS. Will consult dietician to start bolus tube feeds.    CBG (last 3)  Recent Labs    03/25/19 0441 03/25/19  0830 03/25/19 0916  GLUCAP 72 55* 141*   3/10- adequate BGs- will monitor for trend before changing.   3/11- BGs dropped- will decrease Levemir to 27 units BID 9. Dizziness: Will change Cardura to bedtime to avoid SE. Will monitor orthostatic BPs 10. 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   3/11- will add Duonebs QID- not prn-  12. Resting tachycardia: Due to deconditioning. No BB due to soft BP/orthostatic symptoms.  Monitor for now.   3/11- better- rate of 97-98 this AM- con't meds 13. Gangrenous changes bilateral feet: Will order off loading shoes. Continue to monitor.   3/11- stable- will con't to monitor 14. Abnormal LFTs: Recheck labs in am.   3/10- mild elevation of ALT only- will con't to monitor 15. ABLA: Due to critical illness. Will check anemia panel.   3/10- looking better- con't treatment 16. Right Vocal fold immobility, dysphonia:s/p transnasal fiberoptic laryngoscopy by ENT on 3/9 but was aborted due to gagging and vomiting. ENT will re-attempt in a couple of days.   3/11- could hear a few words using PMV this AM- will con't to monitor 17. Leukocytosis-  3/10- pt is afebrile- no specific signs of illness, however WBC is up to 16k-  will reorder labs in AM and check CXR and U/A and Cx to start.   3/11- WBC down to 9.3 and no left shift- U?A not done yet- will monitor 17. Disposition: Patient lives with wife and 5 children aged 24-23 who will provide support upon discharge.    3/11- d/c PICC since not using  LOS: 2 days A FACE TO FACE EVALUATION WAS PERFORMED  Serin Thornell 03/25/2019, 10:11 AM

## 2019-03-25 NOTE — Care Management (Signed)
[facility] Individual Statement of Services  Patient Name:  Toddrick Sanna  Date:  03/25/2019  Welcome to the [facility].  Our goal is to provide you with an individualized program based on your diagnosis and situation, designed to meet your specific needs.  With this comprehensive rehabilitation program, you will be expected to participate in at least 3 hours of rehabilitation therapies Monday-Friday, with modified therapy programming on the weekends.  Your rehabilitation program will include the following services:  Physical Therapy (PT), Occupational Therapy (OT), Speech Therapy (ST), 24 hour per day rehabilitation nursing, Therapeutic Recreaction (TR), Psychology, Neuropsychology, Case Management (Social Worker), Rehabilitation Medicine, Nutrition Services, Pharmacy Services and Other  Weekly team conferences will be held on Tuesdays to discuss your progress.  Your Social Worker will talk with you frequently to get your input and to update you on team discussions.  Team conferences with you and your family in attendance may also be held.  Expected length of stay: 3-4 weeks   Overall anticipated outcome: Minimal Assistance  Depending on your progress and recovery, your program may change. Your Social Worker will coordinate services and will keep you informed of any changes. Your Social Worker's name and contact numbers are listed  below.  The following services may also be recommended but are not provided by the [facility]:   Driving Evaluations  Home Health Rehabiltiation Services  Outpatient Rehabilitation Services  Vocational Rehabilitation   Arrangements will be made to provide these services after discharge if needed.  Arrangements include referral to agencies that provide these services.  Your insurance has been verified to be:  Uninsured  Your primary doctor is:  No PCP  Pertinent information will be shared  with your doctor and your insurance company.  Social Worker: Cecile Sheerer, LCSWA  Information discussed with and copy given to patient by: Gretchen Short, 03/25/2019, 11:04 AM

## 2019-03-25 NOTE — Progress Notes (Signed)
Speech Language Pathology Daily Session Note  Patient Details  Name: Andrew Bruce MRN: [id number] Date of Birth: 1965-05-07  Today's Date: 03/25/2019 SLP Individual Time: 0915-1010 SLP Individual Time Calculation (min): 55 min  Short Term Goals: Week 1: SLP Short Term Goal 1 (Week 1): Pt will complete oropharyngeal strengthening exercises to maximize swallow function and safety for return to safe PO intake SLP Short Term Goal 2 (Week 1): Pt will complete 5 repetitions using EMST 150 device with mod A verbal instruction, at 30 cm H20. SLP Short Term Goal 3 (Week 1): Pt will complete 5 repetitions using IMST device, with mod A verbal instruction to coordinate inspiratory flow through device, at 10 cm H20. SLP Short Term Goal 4 (Week 1): Pt will communicate basic wants and needs with Mod A verbal cues SLP Short Term Goal 5 (Week 1): Pt will demonstrate orientation x4 with mod A multimodal cues SLP Short Term Goal 6 (Week 1): Pt will demonstrate donning and doffing of PMSV with mod A multimodal cues  Skilled Therapeutic Interventions:  Skilled treatment session targeted dysphagia and cognition goals. SLP recieved pt alert with PMSV in place, vitals WNL and inperson interrpreter. Pt continued to tolerate PMSV for ~ 45 minutes. Pt was Mod I for recall of all orientation information and demonstrated selective attention to task. Per recommendation on FEES (03/23/19) ice chips were administered to persevere swallow musculature. Pt with appearance of timely swallow and effortful swallow (per previous recommendation). Pt with no overt s/s of aspiration or pharyngeal wetness. As mentioned earlier, pt tolerated PMSV for majority of session. Pt had flat affect d/t worrying about hospitalization and providing for his family. However after ~ 45 minutes, pt and SLP had establish good rappport and pt was smiling more often. Pt and SLP joked about the number of children pt had. As pt laughed he began coughing with  drop in O2 sats to 85%. SLP removed PMSV to give pt respiratory break. Pt ocntinued coughing in attempt to mobilize secretions but he was not able to with PMSV in place or off. Nursing contacted to suction pt with moderately thick secretions suctions with vitals returning to 90% after pt recovered. Pt's coughing was not related to ice chip trials but appeared triggered by full belly laugh. Pt left in care of his nurses. Pt ocntinues to be appropriate for PMSV during all waking hours. MD was contacted for any possible contraindications to preceed with trials of ice chips. She didn't forsee any contraindications at this time.   Pain    Therapy/Group: Individual Therapy  Fatime Biswell 03/25/2019, 1:29 PM

## 2019-03-25 NOTE — Progress Notes (Signed)
Physical Therapy Wound Treatment Patient Details  Name: Andrew Bruce MRN: [id number] Date of Birth: August 24, 1965 6[birth year] Today's Date: 03/25/2019 PT Individual Time: 1020-1044 PT Individual Time Calculation (min): 24 min   Subjective  Subjective: pt asked for pain meds at end of the treatment Patient and Family Stated Goals: None stated Prior Treatments: treatment on acute  Pain Score:  6/10, premedicated  Wound Assessment  Pressure Injury 02/22/19 Coccyx Medial Unstageable - Full thickness tissue loss in which the base of the injury is covered by slough (yellow, tan, gray, green or brown) and/or eschar (tan, brown or black) in the wound bed. quarter-sized/purple  (Active)  Dressing Type Foam - Lift dressing to assess site every shift;Barrier Film (skin prep);Moist to dry 03/25/19 1157  Dressing Changed 03/25/19 1157  Dressing Change Frequency Daily 03/25/19 1157  State of Healing Other (Comment) 03/25/19 1157  Site / Wound Assessment Yellow;Pink 03/25/19 1157  % Wound base Red or Granulating 20% 03/25/19 1157  % Wound base Yellow/Fibrinous Exudate 80% 03/25/19 1157  % Wound base Black/Eschar 0% 03/25/19 1157  % Wound base Other/Granulation Tissue (Comment) 0% 03/25/19 1157  Peri-wound Assessment Intact 03/24/19 0822  Wound Length (cm) 5 cm 03/24/19 1531  Wound Width (cm) 2 cm 03/24/19 1531  Wound Depth (cm) 0.7 cm 03/24/19 1531  Wound Surface Area (cm^2) 10 cm^2 03/24/19 1531  Wound Volume (cm^3) 7 cm^3 03/24/19 1531  Tunneling (cm) 0 03/23/19 2122  Undermining (cm) 0 03/23/19 2122  Margins Unattached edges (unapproximated) 03/25/19 1157  Drainage Amount Moderate 03/25/19 1157  Drainage Description Sanguineous;No odor 03/25/19 1157  Treatment Cleansed;Debridement (Selective);Hydrotherapy (Pulse lavage);Packing (Saline gauze) 03/24/19 1531   Santyl applied to wound bed prior to applying dressing.    Hydrotherapy Pulsed lavage therapy - wound location: Sacrum Pulsed Lavage with  Suction (psi): 12 psi(4-12) Pulsed Lavage with Suction - Normal Saline Used: 1000 mL Pulsed Lavage Tip: Tip with splash shield Selective Debridement Selective Debridement - Location: Sacrum Selective Debridement - Tools Used: Forceps;Scalpel(able to debride more adherent slough with scissors.  ) Selective Debridement - Tissue Removed: Thick, tenacious, yellow eschar   Wound Assessment and Plan  Wound Therapy - Assess/Plan/Recommendations Wound Therapy - Clinical Statement: Wound continues to present with adherent yellow eschar and partial areas of pink granulation.  Pt continues to benefit from hydrotherapy for decreasing bio burden and for selective debridement to remove adherent eshar and promote granulation.  Goal is to continue to decrease necrosis and bioburden in wound bed.   Wound Therapy - Functional Problem List: Global weakness s/p COVID, CVA's, aortic clot Factors Delaying/Impairing Wound Healing: Multiple medical problems;Immobility Hydrotherapy Plan: Debridement;Dressing change;Patient/family education;Pulsatile lavage with suction Wound Therapy - Frequency: 6X / week Wound Therapy - Follow Up Recommendations: Home health RN Wound Plan: See above  Wound Therapy Goals- Improve the function of patient's integumentary system by progressing the wound(s) through the phases of wound healing (inflammation - proliferation - remodeling) by: Decrease Necrotic Tissue to: 10 Decrease Necrotic Tissue - Progress: Progressing toward goal Increase Granulation Tissue to: 90 Increase Granulation Tissue - Progress: Progressing toward goal Goals/treatment plan/discharge plan were made with and agreed upon by patient/family: Yes Time For Goal Achievement: 7 days Wound Therapy - Potential for Goals: Good  Goals will be updated until maximal potential achieved or discharge criteria met.  Discharge criteria: when goals achieved, discharge from hospital, MD decision/surgical intervention, no progress  towards goals, refusal/missing three consecutive treatments without notification or medical reason.  GP     Andrew Bruce  Andrew Bruce 03/25/2019, 11:58 AM  03/25/2019  Andrew Bruce., PT Acute Rehabilitation Services [phone]  (pager) [phone]  (office)

## 2019-03-25 NOTE — Progress Notes (Signed)
Social Work Assessment and Plan   Patient Details  Name: Andrew Bruce MRN: [id number] Date of Birth: July 20, 1965  Today's Date: 03/25/2019  Problem List:  Patient Active Problem List   Diagnosis Date Noted   PEG (percutaneous endoscopic gastrostomy) status (HCC) 03/24/2019   Leukocytosis    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   Status post tracheostomy (HCC)    Pressure injury of skin 03/02/2019   On mechanically assisted ventilation (HCC)    Goals of care, counseling/discussion    Palliative care encounter    ARDS (adult respiratory distress syndrome) (HCC)    Cerebral embolism with cerebral infarction 02/17/2019   Acute respiratory failure with hypoxia (HCC)    Pneumothorax, right    COVID-19 02/06/2019   Past Medical History: History reviewed. No pertinent past medical history. Past Surgical History:  Past Surgical History:  Procedure Laterality Date   IR GASTROSTOMY TUBE MOD SED  03/10/2019   Social History:  reports that he has never smoked. He has never used smokeless tobacco. He reports current alcohol use. He reports that he does not use drugs.  Family / Support Systems Marital Status: Married How Long?: 21 years Patient Roles: Spouse, Parent Spouse/Significant Other: Andrew Bruce Children: 5 daughters Other Supports: Siblings, nieces/nephews Anticipated Caregiver: Pt primary caregivers will be his wife, daughters, and various family members. Ability/Limitations of Caregiver: None reported Caregiver Availability: 24/7 Family Dynamics: Pt lives in the home with his wife, 4 children ( 50 y.o. son; 28 y.o. dtr/2 other children are adults); and otehr family members  Social History Preferred language: Spanish Religion: Non-Denominational Cultural Background: [place] migrated to [place] is not documented. Education: Pt did not complete school when he arrived to [place] Read: Yes Write: Yes Employment Status: Unemployed Marine scientist Issues:  Denies Guardian/Conservator: N/A   Abuse/Neglect Abuse/Neglect Assessment Can Be Completed: Unable to assess, patient is non-responsive or altered mental status(pt has trach; did not feel up to talking) Physical Abuse: Denies Verbal Abuse: Denies Sexual Abuse: Denies Exploitation of patient/patient's resources: Denies Self-Neglect: Denies  Emotional Status Pt's affect, behavior and adjustment status: Pt appears to be adjusting to condition Recent Psychosocial Issues: Pt dtr denies Psychiatric History: Pt dtr denies Substance Abuse History: Pt dtr denies; pt dtr states he smoked in the past, social drinker on occassions,a nd no rec drug use  Patient / Family Perceptions, Expectations & Goals Pt/Family understanding of illness & functional limitations: Pt dtr has a general understanding of pt care needs Premorbid pt/family roles/activities: Pt was independent Anticipated changes in roles/activities/participation: Pt will require assistance with ADLs/IADLs  [employer] Agencies: None Premorbid Home Care/DME Agencies: None Resource referrals recommended: Neuropsychology  Discharge Planning Living Arrangements: Spouse/significant other, Children, Other relatives Support Systems: Spouse/significant other, Children, Other relatives Type of Residence: Private residence Insurance Resources: Customer service manager Resources: Other (Comment) Financial Screen Referred: No Living Expenses: Own Money Management: Patient, Spouse Does the patient have any problems obtaining your medications?: No Home Management: Pt and wife manage house needs Sw Barriers to Discharge: Other (comments) Sw Barriers to Discharge Comments: Uninsured Social Work Anticipated Follow Up Needs: HH/OP  Clinical Impression SW completed assessment with [name]. SW introduced self, explain role, and discussed discharge process.   Gretchen Short 03/25/2019, 4:49 PM

## 2019-03-25 NOTE — Progress Notes (Signed)
   Subjective:    Patient ID: Andrew Bruce, male    DOB: 15-Oct-1965, 54 y.o.   MRN: [id number]  HPI    Review of Systems     Objective:   Physical Exam        Assessment & Plan:  I came by to attempt laryngoscopy again but he did not feel that he could tolerate it today.  I spoke with his rehab provider who will call me back when he is ready to try again.

## 2019-03-25 NOTE — Significant Event (Signed)
Hypoglycemic Event  CBG: 54  Treatment: 4 oz juice/soda  Symptoms: None  Follow-up CBG: Time:442 CBG Result:72  Possible Reasons for Event: Inadequate meal intake and Other: change from continuous to bolus tube feeds  Comments/MD notified:N/A    Graciela Husbands

## 2019-03-25 NOTE — Progress Notes (Signed)
Occupational Therapy Session Note  Patient Details  Name: Basilio Meadow MRN: [id number] Date of Birth: 1965-10-17  Today's Date: 03/25/2019 OT Individual Time: 1100-1200 OT Individual Time Calculation (min): 60 min    Short Term Goals: Week 1:  OT Short Term Goal 1 (Week 1): Pt will tolerate out of bed for 2-3 hrs a day OT Short Term Goal 2 (Week 1): Pt will perform UB dressing wth mod A OT Short Term Goal 3 (Week 1): Pt will dress LB clothing (brief/ pants) with max A OT Short Term Goal 4 (Week 1): Pt will transfer to toilet with max A +1  Skilled Therapeutic Interventions/Progress Updates:    1:1 Pt in bed when arrived. Pt came to EOB with mod A with extra time. Focus on dressing today at EOB. Pt able to don shirt with mod A with extra time. Pt still without clothing so donned paper clothing. Pt required A to thread pants. ACE wraps up to thigh. Pt performed sit to stand with mod A and total A to pull up pants. Total A to don off loading shoes- pt performed stand pivot transfer with RW to the w/c with mod to max A - requiring more A due to walker. Switched to portable trach collar and taken to the gym. Focus on functional use of right Ue with functional reach and pincher grasp with clothes pins on metal dowels with min to mod A. Return to room and issued pink foam block to encourage grasp strength of right hand. Also cut up foam block to work on picking up items and placing it in cup. Pt able to perform tasks with 50% error with dropping item. Pt able to tolerate sitting in the w/c for ~30 min. Returned to bed with stand pivot with mod A with extra time. Max A to return to supine. Left resting in the bed.   Therapy Documentation Precautions:  Precautions Precautions: Fall, Other (comment) Precaution Comments: PMSV at all times, PEG;  NPO Restrictions Weight Bearing Restrictions: No Other Position/Activity Restrictions: HOB elevated 30 degrees at all times Pain:  pt in right shoulder  with shoulder flexion- allowed for rest break and MD made aware  Therapy/Group: Individual Therapy  Roney Mans [facility] 03/25/2019, 3:25 PM

## 2019-03-25 NOTE — Progress Notes (Signed)
Physical Therapy Session Note  Patient Details  Name: Andrew Bruce MRN: [id number] Date of Birth: 10/31/1965  Today's Date: 03/25/2019 PT Individual Time: 7654-6503 PT Individual Time Calculation (min): 78 min   Short Term Goals: Week 1:  PT Short Term Goal 1 (Week 1): Patient will perform bed mobility with supervision with HOB elevated to 30 degrees. PT Short Term Goal 2 (Week 1): Patient will perofrm basic transfers with max assist. PT Short Term Goal 3 (Week 1): Patient will tolerated sitting in w/c or recliner >1 hour. PT Short Term Goal 4 (Week 1): Patient will initiate standing activities using LRAD with max A.  Skilled Therapeutic Interventions/Progress Updates:    Patient in bed on 10L/min via trach collar FiO2 40% with PMSV in place upon PT arrival. Patient alert and agreeable to PT session. Patient reported mild L shoulder pain during session and mild R shoulder pain to MD at end of session, RN made aware. PT provided repositioning, rest breaks, and distraction as pain interventions throughout session. Provided patient with TIS w/c at beginning of session.  Therapeutic Activity: Bed Mobility: Patient performed supine to/from sit with min a for LE and trunk managment. Provided verbal cues for hand placement to push up and avoiding hitting his toes on objects when lifting LEs into the bed. Donned B Darco shoes with max A for time management and energy conservation with patient sitting EOB.  Transfers: Patient performed sit to/from stand x3 with mod progressing to min A using a RW from an elevated mat table. Provided verbal cues for hand placement, reaching back to sit, and heel weight bearing in Darco shoes. He performed stand pivot without the RW x1 and with the RW x1 with mod A and decreased motor planning. Provided cues for sequencing, forward weight shift, and maintaining heel weight bearing. He also performed a squat pivot transfer TIS w/c>mat table with min-mod A. Provided cues  for hand placement and heel weight bearing.  Gait Training:  Patient performed weight shifts in standing x30 sec then ambulated with a RW 2 steps forward, but was unable to take 2 steps backwards wearing Darco shoes due to decreased LE strength. Provided verbal cues for maintaining heel weight bearing, patient compliant throughout.  Wheelchair Mobility:  Patient was transported in the [place] w/c with total A throughout session for energy conservation and time management.  Patient tolerated sitting EOM x3-4 min between standing trials with close supervision for safety and tolerated sitting in the TIS w/c >10 min at end of session as Dr. Dagoberto Ligas, MD came by to assess his shoulder and discuss challenges of Darco shoes with PT and the patient. MD recommended that the patient's family bring in shoes that are 2 sized too big to allow additional space at the toes and cleared patient to ambulate and transfer in these instead of the Darco shoes for improved mobility. Patient stated understanding.   Patient in bed at end of session with breaks locked, bed alarm set, and all needs within reach. Patient remained on 10L/min and 40% FiO2 via trach collar throughout session using portable O2 tank and wall unit O2 in the room. O2 sats remained between 92-97% and HR between 99-117 on continuous pulse ox with activity during session. Patient denied dizziness this session and BP WFL at end of session, see flowsheet.   Therapy Documentation Precautions:  Precautions Precautions: Fall, Other (comment) Precaution Comments: PMSV at all times, PEG;  NPO Restrictions Weight Bearing Restrictions: No Other Position/Activity Restrictions: HOB elevated 30  degrees at all times    Therapy/Group: Individual Therapy  Karliah Kowalchuk L Benicio Manna PT, DPT  03/25/2019, 5:52 PM

## 2019-03-25 NOTE — Progress Notes (Signed)
Social Work Patient ID: Andrew Bruce, male   DOB: January 25, 1965, 54 y.o.   MRN: [id number]   SW made efforts to complete assessment with pt using [employer] Interpreter Andrew Bruce ID #[id number]. Pt has trach and was agreeable with SW making contact with his dtr Andrew Bruce [phone]) to ask questions since it appeared it was challenging for him to talk.  SW called [name] to introduce self, explain role, and discuss discharge process. SW completed assessment with pt dtr. She asked how well pt was doing. SW encouraged her to call and speak with nursing staff to get updates. SW entered assessment.   Cecile Sheerer, MSW, LCSWA Office: [phone] Cell: [phone] Fax: [phone]

## 2019-03-25 NOTE — Progress Notes (Signed)
0940 called to room by ST due to patient requesting suctioning and o2 decreased with PMV on. Patient attempted to cough with minimal success. Patient suctioned for thick moderate amount of yellow mucus. Patient noted to desaturate and HR > 100. O2 sat approx. 88% after suctioning. Trach collar reapplied for recovery and patient O2 sat increased to 92% with HR decreasing with in 1- 2 minutes. Patient able to cough moderated amount of thick yellow mucus on his own after suctioning completed. O2 sat and HR WNL at this time. Continue with plan of care. Cleotilde Neer

## 2019-03-26 ENCOUNTER — Inpatient Hospital Stay (HOSPITAL_COMMUNITY): Payer: Self-pay

## 2019-03-26 ENCOUNTER — Inpatient Hospital Stay (HOSPITAL_COMMUNITY): Payer: Self-pay | Admitting: Occupational Therapy

## 2019-03-26 ENCOUNTER — Inpatient Hospital Stay (HOSPITAL_COMMUNITY): Payer: Self-pay | Admitting: Speech Pathology

## 2019-03-26 LAB — GLUCOSE, CAPILLARY
Glucose-Capillary: 153 mg/dL — ABNORMAL HIGH (ref 70–99)
Glucose-Capillary: 157 mg/dL — ABNORMAL HIGH (ref 70–99)
Glucose-Capillary: 51 mg/dL — ABNORMAL LOW (ref 70–99)
Glucose-Capillary: 81 mg/dL (ref 70–99)

## 2019-03-26 MED ORDER — INSULIN DETEMIR 100 UNIT/ML ~~LOC~~ SOLN
25.0000 [IU] | Freq: Two times a day (BID) | SUBCUTANEOUS | Status: DC
  Administered 2019-03-26 – 2019-03-28 (×4): 25 [IU] via SUBCUTANEOUS
  Filled 2019-03-26 (×5): qty 0.25

## 2019-03-26 MED ORDER — IPRATROPIUM-ALBUTEROL 0.5-2.5 (3) MG/3ML IN SOLN
3.0000 mL | Freq: Three times a day (TID) | RESPIRATORY_TRACT | Status: DC
  Administered 2019-03-26 – 2019-03-30 (×13): 3 mL via RESPIRATORY_TRACT
  Filled 2019-03-26 (×13): qty 3

## 2019-03-26 NOTE — Progress Notes (Signed)
Speech Language Pathology Daily Session Note  Patient Details  Name: Andrew Bruce MRN: [id number] Date of Birth: 1965/10/09  Today's Date: 03/26/2019 SLP Individual Time: 0900-0930 SLP Individual Time Calculation (min): 30 min  Short Term Goals: Week 1: SLP Short Term Goal 1 (Week 1): Pt will complete oropharyngeal strengthening exercises to maximize swallow function and safety for return to safe PO intake SLP Short Term Goal 2 (Week 1): Pt will complete 5 repetitions with EMST 150 device set at 30 cmH20 and IMST device set at 10 cmH20  with mod A verbal instruction. SLP Short Term Goal 3 (Week 1): Pt will consume ice chips at bedside to rehabilitate musculature and to increase ROM. SLP Short Term Goal 3 - Progress (Week 1): Revised due to lack of progress SLP Short Term Goal 4 (Week 1): Pt will communicate basic wants and needs with Mod A verbal cues SLP Short Term Goal 5 (Week 1): Pt will demonstrate orientation x4 with mod A multimodal cues SLP Short Term Goal 5 - Progress (Week 1): Met SLP Short Term Goal 6 (Week 1): Pt will demonstrate donning and doffing of PMSV with mod A multimodal cues  Skilled Therapeutic Interventions:  Skilled treatment session targeted dysphagia and cognition goals. SLP facilitated session by placing PMSV. Pt attempted but unable to coordinate d/t RUE weakness. Pt tolerated PMSV for ~ 30 minutes with PMSV left in place at end of session. Pt also recalled need to call his wife and have her bring in clothes for PT/OT sessions. Interpreter assisted with phone call. SLP also consumed ice chips with appearance of timely swallow initiation and no overt s/s of aspiration. Pt left upright in bed, nurse present and all needs within reach.      Pain Pain Assessment Faces Pain Scale: Hurts a little bit PAINAD (Pain Assessment in Advanced Dementia) Breathing: normal Negative Vocalization: none Facial Expression: smiling or inexpressive Body Language:  relaxed Consolability: no need to console PAINAD Score: 0  Therapy/Group: Individual Therapy  Lesta Limbert 03/26/2019, 4:05 PM

## 2019-03-26 NOTE — Progress Notes (Signed)
Occupational Therapy Session Note  Patient Details  Name: Andrew Bruce MRN: [id number] Date of Birth: Nov 30, 1965  Today's Date: 03/26/2019 OT Individual Time: 0930-1002 and 0109-3235 OT Individual Time Calculation (min): 32 min and 57 min  Short Term Goals: Week 1:  OT Short Term Goal 1 (Week 1): Pt will tolerate out of bed for 2-3 hrs a day OT Short Term Goal 2 (Week 1): Pt will perform UB dressing wth mod A OT Short Term Goal 3 (Week 1): Pt will dress LB clothing (brief/ pants) with max A OT Short Term Goal 4 (Week 1): Pt will transfer to toilet with max A +1  Skilled Therapeutic Interventions/Progress Updates:    Pt greeted in bed with RN present to administer medicine via PEG. Pt wearing PMSV and interpretor present. He denied pain, stated he had dizziness with mobility yesterday so OT donned ACE wraps in supine for BP mgt. Obtained verbal order from MD regarding d/c of Darco shoe order, having pt instead bear weight through heels only during transfers wearing gripper socks. Pt verbalized understanding of these precautions prior to OOB mobility, 02 sats 94% and above during transition to EOB. Min-Mod A for stand pivot<BSC using RW. Note that OT elevated bed height prior to standing. Pt left in care of RN to continue with B+B void, satting in the 90s via trach collar prior to departure.   OT also needed to retrieve a BSC bucket from the supply room during tx   2nd Session 1:1 tx (57 min) Pt greeted in bed, gesturing towards the bag that family just brought him. Pt selected clothing items he wanted to wear with vcs to include shirt. He stated his shoe size was 9 and family brought him black sneakers size 10.5 (MD stated she wanted him to wear shoes that were 1 shoe size greater than normal). Placed them by his portable 02 tank in room. Max A for supine<sit and Min A for sit<stand with RW, Mod A to turn hips in order to reach transfer surface as he tends to undershoot. While upright in TIS,  pt donned his pants with Max A, noted weak grasp with the Rt hand and pt unable to maintain grip on fabric. OT assisted him with figure 4 positioning and with dressing the Rt side first. He was able to assist with elevating clothing while standing with RW but did need A while elevating Rt side. After returning to sitting, pt requested to be reclined in TIS due to sacral wound pain. Pt engaged in hair washing and UB bathing while reclined at the sink, able to tolerate sitting upright in TIS for UB dressing. Pt needing assist to orient clothing but otherwise donned shirt with Min A. Pt combed hair with setup and also completed oral care using suction toothbrush, needed A to meet FM demands of washing hands with sanitizer. He then requested to return to bed. Min A for sit<stand, and Mod A once again to swing hips to reach the transfer surface. Pt aware beforehand, once again, to only bear weight through his heels. He returned to bed with Mod A, able to boost himself up using LEs. Pt assisted with obtaining sidelying position for pressure relief and pain mgt. Left him with all needs within reach, satting at 94% via trach collar.     Therapy Documentation Precautions:  Precautions Precautions: Fall, Other (comment) Precaution Comments: PMSV at all times, PEG;  NPO Restrictions Weight Bearing Restrictions: No Other Position/Activity Restrictions: HOB elevated 30 degrees at  all times Vital Signs: Therapy Vitals Pulse Rate: (!) 101 Resp: 20 Patient Position (if appropriate): Lying Oxygen Therapy SpO2: 94 % O2 Device: Tracheostomy Collar O2 Flow Rate (L/min): 8 L/min FiO2 (%): 35 % Pain: 8/10 during 2nd session when sitting up in TIS, RN in during session to provide pain medicine via PEG   ADL: ADL Eating: NPO Grooming: Maximal assistance Where Assessed-Grooming: Sitting at sink Upper Body Bathing: Minimal assistance Where Assessed-Upper Body Bathing: Sitting at sink Lower Body Bathing: Maximal  assistance Where Assessed-Lower Body Bathing: Sitting at sink Upper Body Dressing: (hospital gown) Lower Body Dressing: (hospital gown and socks and ACE) Toileting: Unable to assess Toilet Transfer: Unable to assess     Therapy/Group: Individual Therapy  Sorayah Schrodt A Samil Mecham 03/26/2019, 12:33 PM

## 2019-03-26 NOTE — Progress Notes (Signed)
[place] PHYSICAL MEDICINE & REHABILITATION PROGRESS NOTE   Subjective/Complaints:   Pt reports R shoulder pain still an issue with therapy- not as much at rest.  Tele-interpretor still not working.   Getting deep suctioning by resp therapy. Didn't get a lot.  Denies pain in blackened toes B/L SOB somewhat better  ROS: pt denies CP, N/V/C/D, abd pain- SOB as above.     Objective:   DG CHEST PORT 1 VIEW  Result Date: 03/24/2019 CLINICAL DATA:  Shortness of breath EXAM: PORTABLE CHEST 1 VIEW COMPARISON:  03/13/2019 FINDINGS: Cardiac shadow is stable. Right-sided PICC line is noted and stable. Tracheostomy tube is again seen and stable. Patchy infiltrates are identified bilaterally slightly increased when compared with the prior exam. No pneumothorax or sizable effusion is seen. IMPRESSION: Slight increase in patchy opacities bilaterally. Tubes and lines as described. Electronically Signed   By: Inez Catalina M.D.   On: 03/24/2019 19:18   Recent Labs    03/24/19 0458 03/25/19 0407  WBC 16.3* 9.3  HGB 10.2* 10.0*  HCT 33.6* 33.3*  PLT 452* 470*   Recent Labs    03/24/19 0458 03/25/19 0407  NA 137 137  K 3.9 3.7  CL 100 98  CO2 28 28  GLUCOSE 152* 53*  BUN 21* 16  CREATININE 0.46* <0.30*  CALCIUM 8.8* 8.5*    Intake/Output Summary (Last 24 hours) at 03/26/2019 0926 Last data filed at 03/25/2019 1900 Gross per 24 hour  Intake --  Output 625 ml  Net -625 ml     Physical Exam: Vital Signs Blood pressure 126/89, pulse 100, temperature 97.9 F (36.6 C), temperature source Oral, resp. rate 18, height 5\' 6"  (1.676 m), weight (!) 0.003 kg, SpO2 98 %.  Nursing note and vitals reviewed. Constitutional: pt awake, laying in bed; speaks spanish, NAD Throat: trach in place; on 35% FiO2; PMV not on this AM Cardiovascular: borderline tachycardia regular rhythm Respiratory: coarse; but less so than yesterday; god air movement- just got suctioned; no W/R/R  GI: soft, NT, ND,  (+)BS; hypoactive still; concave abdomen Musculoskeletal:        General: No edema.     Comments: B/L toes still black- more chronic- will con't monitoring.   Diffuse weakness without focal deficits 4/5 in bilateral UE and 3/5 in bilateral LE.  Neurological: He is alert.   Skin: He is not diaphoretic.  Sacral decub covered with foam dressing.   gangrenous toes- but covered with dry dressings. Psych: sad, but appropriate   Assessment/Plan: 1. 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  Physiatrist is providing close team supervision and 24 hour management of active medical problems listed below.  Physiatrist and rehab team continue to assess barriers to discharge/monitor patient progress toward functional and medical goals  Care Tool:  Bathing    Body parts bathed by patient: Right arm, Left arm, Chest, Abdomen, Face   Body parts bathed by helper: Front perineal area, Buttocks, Right upper leg, Left upper leg, Right lower leg, Left lower leg     Bathing assist Assist Level: Moderate Assistance - Patient 50 - 74%     Upper Body Dressing/Undressing Upper body dressing   What is the patient wearing?: Hospital gown only    Upper body assist      Lower Body Dressing/Undressing Lower body dressing      What is the patient  wearing?: Hospital gown only, Incontinence brief     Lower body assist Assist for lower body dressing: Total Assistance - Patient < 25%     Toileting Toileting Toileting Activity did not occur Press photographer and hygiene only): N/A (no void or bm)(foley)  Toileting assist Assist for toileting: Dependent - Patient 0%     Transfers Chair/bed transfer  Transfers assist  Chair/bed transfer activity did not occur: Safety/medical concerns(limited by dizziness, chest pain)  Chair/bed transfer assist  level: Moderate Assistance - Patient 50 - 74%     Locomotion Ambulation   Ambulation assist   Ambulation activity did not occur: Safety/medical concerns(limited by dizziness, chest pain)  Assist level: Moderate Assistance - Patient 50 - 74% Assistive device: Walker-rolling(B Darco shoes) Max distance: 2 ft   Walk 10 feet activity   Assist  Walk 10 feet activity did not occur: Safety/medical concerns(limited by dizziness, chest pain)        Walk 50 feet activity   Assist Walk 50 feet with 2 turns activity did not occur: Safety/medical concerns(limited by dizziness, chest pain)         Walk 150 feet activity   Assist Walk 150 feet activity did not occur: Safety/medical concerns(limited by dizziness, chest pain)         Walk 10 feet on uneven surface  activity   Assist Walk 10 feet on uneven surfaces activity did not occur: Safety/medical concerns(limited by dizziness, chest pain)         Wheelchair     Assist     Wheelchair activity did not occur: Safety/medical concerns(limited by dizziness, chest pain)         Wheelchair 50 feet with 2 turns activity    Assist    Wheelchair 50 feet with 2 turns activity did not occur: Safety/medical concerns(limited by dizziness, chest pain)       Wheelchair 150 feet activity     Assist  Wheelchair 150 feet activity did not occur: Safety/medical concerns(limited by dizziness, chest pain)       Blood pressure 126/89, pulse 100, temperature 97.9 F (36.6 C), temperature source Oral, resp. rate 18, height 5\' 6"  (1.676 m), weight (!) 0.003 kg, SpO2 98 %.  1.  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              -patient may shower  3/12- pt reports problems with Darco shoes- couldn't really weight bear well- asked teamt o use traction socks until family could bring in shoes 1-2 sizes larger. Don't need to wear the shoes- no pain on toes/feet.               -ELOS/Goals: 2-3 weeks minA in PT, OT, SLP 2.  Antithrombotics: -DVT/anticoagulation:  Pharmaceutical: Other (comment)--Eliquis             -antiplatelet therapy: N/A 3. Pain Management: Fentanyl IV prior to hydrotherapy. Will add oxycodone prn for sacral pain.  4. Mood: LCSW to follow for evaluation and support.              -antipsychotic agents: N/A 5. Neuropsych: This patient appears to be capable of making decisions on his own behalf. 6. Sacral decubitus/Skin/Wound Care: continue air mattress overlay. Hydrotherapy 6 days a week. Tube feeds with additional protein supplement and vitamins to help promote wound healing.  7. Fluids/Electrolytes/Nutrition: Monitor I/O. Check lytes in am and weekly.  8. T2DM: Hgb A1c- 11.7--continue Levemir bid with SSI every 4 hours for  elevated BS. Will consult dietician to start bolus tube feeds.    CBG (last 3)  Recent Labs    03/26/19 0016 03/26/19 0425 03/26/19 0531  GLUCAP 157* 51* 81   3/10- adequate BGs- will monitor for trend before changing.   3/11- BGs dropped- will decrease Levemir to 27 units BID  3/12- will drop Levimir to 25 mg BID since having low BGs still.  9. Dizziness: Will change Cardura to bedtime to avoid SE. Will monitor orthostatic BPs 10. 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   3/11- will add Duonebs QID- not prn-   3/12- improved SOB- con't regimen- discussed with resp therapy.  12. Resting tachycardia: Due to deconditioning. No BB due to soft BP/orthostatic symptoms.  Monitor for now.   3/11- better- rate of 97-98 this AM- con't meds 13. Gangrenous changes bilateral feet: Will order off loading shoes. Continue to monitor.   3/11- stable- will con't to monitor  3/12- will not use off loading shoes- not working for pt at all- too unwieldy- will use tractions socks for now.  14. Abnormal LFTs: Recheck labs in am.   3/10- mild elevation of ALT only- will con't to monitor 15.  ABLA: Due to critical illness. Will check anemia panel.   3/10- looking better- con't treatment 16. Right Vocal fold immobility, dysphonia:s/p transnasal fiberoptic laryngoscopy by ENT on 3/9 but was aborted due to gagging and vomiting. ENT will re-attempt in a couple of days.   3/11- could hear a few words using PMV this AM- will con't to monitor 17. Leukocytosis-  3/10- pt is afebrile- no specific signs of illness, however WBC is up to 16k- will reorder labs in AM and check CXR and U/A and Cx to start.   3/11- WBC down to 9.3 and no left shift- U?A not done yet- will monitor 17. Disposition: Patient lives with wife and 5 children aged 73-23 who will provide support upon discharge.    3/11- d/c PICC since not using  LOS: 3 days A FACE TO FACE EVALUATION WAS PERFORMED  Zavior Thomason 03/26/2019, 9:26 AM

## 2019-03-26 NOTE — Progress Notes (Signed)
Physical Therapy Wound Treatment Patient Details  Name: Andrew Bruce MRN: [id number] Date of Birth: 13-Mar-1965  Today's Date: 03/26/2019 PT Individual Time: 1000-1037 PT Individual Time Calculation (min): 37 min   Subjective  Patient and Family Stated Goals: None stated Prior Treatments: treatment on acute  Pain Score:  4/10 premedicated  Wound Assessment  Pressure Injury 02/22/19 Coccyx Medial Unstageable - Full thickness tissue loss in which the base of the injury is covered by slough (yellow, tan, gray, green or brown) and/or eschar (tan, brown or black) in the wound bed. quarter-sized/purple  (Active)  Dressing Type Foam - Lift dressing to assess site every shift;Barrier Film (skin prep);Moist to dry 03/26/19 1101  Dressing Changed 03/26/19 1101  Dressing Change Frequency Daily 03/26/19 1101  State of Healing Other (Comment) 03/26/19 1101  Site / Wound Assessment Yellow;Pink 03/26/19 1101  % Wound base Red or Granulating 30% 03/26/19 1101  % Wound base Yellow/Fibrinous Exudate 70% 03/26/19 1101  % Wound base Black/Eschar 0% 03/26/19 1101  % Wound base Other/Granulation Tissue (Comment) 0% 03/26/19 1101  Peri-wound Assessment Intact 03/24/19 0822  Wound Length (cm) 5 cm 03/24/19 1531  Wound Width (cm) 2 cm 03/24/19 1531  Wound Depth (cm) 0.7 cm 03/24/19 1531  Wound Surface Area (cm^2) 10 cm^2 03/24/19 1531  Wound Volume (cm^3) 7 cm^3 03/24/19 1531  Tunneling (cm) 0 03/23/19 2122  Undermining (cm) 0 03/23/19 2122  Margins Unattached edges (unapproximated) 03/26/19 1101  Drainage Amount Moderate 03/26/19 1101  Drainage Description Sanguineous;No odor 03/26/19 1101  Santyl applied to wound bed prior to applying dressing.  Hydrotherapy Pulsed lavage therapy - wound location: Sacrum Pulsed Lavage with Suction (psi): 12 psi(4-12) Pulsed Lavage with Suction - Normal Saline Used: 1000 mL Pulsed Lavage Tip: Tip with splash shield Selective Debridement Selective Debridement -  Location: Sacrum Selective Debridement - Tools Used: Forceps;Scalpel(able to debride more adherent slough with scissors.  ) Selective Debridement - Tissue Removed: Thick, tenacious, yellow eschar   Wound Assessment and Plan  Wound Therapy - Assess/Plan/Recommendations Wound Therapy - Clinical Statement: Wound continues to present with adherent yellow eschar and partial areas of pink granulation.  Pt continues to benefit from hydrotherapy for decreasing bio burden and for selective debridement to remove adherent eshar and promote granulation.  Goal is to continue to decrease necrosis and bioburden in wound bed.   Wound Therapy - Functional Problem List: Global weakness s/p COVID, CVA's, aortic clot Factors Delaying/Impairing Wound Healing: Multiple medical problems;Immobility Hydrotherapy Plan: Debridement;Dressing change;Patient/family education;Pulsatile lavage with suction Wound Therapy - Frequency: 6X / week Wound Therapy - Follow Up Recommendations: Home health RN Wound Plan: See above  Wound Therapy Goals- Improve the function of patient's integumentary system by progressing the wound(s) through the phases of wound healing (inflammation - proliferation - remodeling) by: Decrease Necrotic Tissue to: 10 Decrease Necrotic Tissue - Progress: Progressing toward goal Increase Granulation Tissue to: 90 Increase Granulation Tissue - Progress: Progressing toward goal Goals/treatment plan/discharge plan were made with and agreed upon by patient/family: Yes Time For Goal Achievement: 7 days Wound Therapy - Potential for Goals: Good  Goals will be updated until maximal potential achieved or discharge criteria met.  Discharge criteria: when goals achieved, discharge from hospital, MD decision/surgical intervention, no progress towards goals, refusal/missing three consecutive treatments without notification or medical reason.  Andrew Bruce     Andrew Bruce 03/26/2019, 11:03 AM  03/26/2019  Andrew Bruce., PT Acute Rehabilitation Services [phone]  (pager) [phone]  (office)

## 2019-03-26 NOTE — Plan of Care (Signed)
  Problem: Consults Goal: RH GENERAL PATIENT EDUCATION Description: See Patient Education module for education specifics. Outcome: Progressing Goal: Diabetes Guidelines if Diabetic/Glucose > 140 Description: If diabetic or lab glucose is > 140 mg/dl - Initiate Diabetes/Hyperglycemia Guidelines & Document Interventions  Outcome: Progressing   Problem: RH BOWEL ELIMINATION Goal: RH STG MANAGE BOWEL WITH ASSISTANCE Description: STG Manage Bowel with mod I Assistance. Outcome: Progressing   Problem: RH BLADDER ELIMINATION Goal: RH STG MANAGE BLADDER WITH ASSISTANCE Description: STG Manage Bladder With mod I Assistance Outcome: Progressing   Problem: RH SAFETY Goal: RH STG ADHERE TO SAFETY PRECAUTIONS W/ASSISTANCE/DEVICE Description: STG Adhere to Safety Precautions With mod I Assistance/Device. Outcome: Progressing

## 2019-03-26 NOTE — IPOC Note (Signed)
Overall Plan of Care [facility]) Patient Details Name: Andrew Bruce MRN: [id number] DOB: 09-05-1965  Admitting Diagnosis: Embolic stroke [facility])  Hospital Problems: Principal Problem:   Embolic stroke [facility]) Active Problems:   COVID-19   Acute respiratory failure with hypoxia ([place])   Pressure injury of skin   Status post tracheostomy ([place])   PEG (percutaneous endoscopic gastrostomy) status ([place])   Leukocytosis     Functional Problem List: Nursing Endurance, Bladder, Pain, Safety, Bowel, Medication Management, Nutrition, Skin Integrity  PT Balance, Behavior, Edema, Endurance, Motor, Nutrition, Pain, Perception, Safety, Sensory, Skin Integrity  OT Balance, Safety, Cognition, Edema, Endurance, Motor, Pain, Sensory, Perception  SLP Cognition, Endurance, Linguistic, Safety  TR         Basic ADL's: OT Grooming, Bathing, Dressing, Toileting     Advanced  ADL's: OT       Transfers: PT Bed Mobility, Bed to Chair, Car, Furniture, Floor  OT Toilet, Metallurgist: PT Ambulation, Emergency planning/management officer, Stairs     Additional Impairments: OT Fuctional Use of Upper Extremity  SLP Swallowing, Communication, Social Cognition expression Problem Solving, Attention, Awareness, Memory  TR      Anticipated Outcomes Item Anticipated Outcome  Self Feeding n/a  Swallowing  MinA   Basic self-care  min A  Toileting  min  A   Bathroom Transfers min A  Bowel/Bladder  Patient will be able to have elimination needs met by discharge  Transfers  min A using LRAD  Locomotion  min A using LRAD  Communication  MinA  Cognition  MinA  Pain  Pain will be be managed to an acceptable level by discharge  Safety/Judgment  Patient will have no fall with injury while on CIR   Therapy Plan: PT Intensity: Minimum of 1-2 x/day ,45 to 90 minutes PT Frequency: 5 out of 7 days PT Duration Estimated Length of Stay: 3-4 weeks OT Intensity: Minimum of 1-2 x/day, 45 to 90 minutes OT  Frequency: 5 out of 7 days OT Duration/Estimated Length of Stay: ~3- 4 weeks SLP Intensity: Minumum of 1-2 x/day, 30 to 90 minutes SLP Frequency: 3 to 5 out of 7 days SLP Duration/Estimated Length of Stay: 3-4 weeks   Due to the current state of emergency, patients may not be receiving their 3-hours of Medicare-mandated therapy.   Team Interventions: Nursing Interventions Patient/Family Education, Pain Management, Bladder Management, Medication Management, Bowel Management, Disease Management/Prevention, Skin Care/Wound Management, Psychosocial Support, Dysphagia/Aspiration Precaution Training, Discharge Planning  PT interventions Ambulation/gait training, Discharge planning, Functional mobility training, Psychosocial support, Therapeutic Activities, Balance/vestibular training, Disease management/prevention, Neuromuscular re-education, Skin care/wound management, Therapeutic Exercise, Wheelchair propulsion/positioning, Cognitive remediation/compensation, DME/adaptive equipment instruction, Pain management, Splinting/orthotics, UE/LE Strength taining/ROM, Community reintegration, Technical sales engineer stimulation, Patient/family education, IT trainer, UE/LE Coordination activities  OT Interventions Training and development officer, Discharge planning, Pain management, Self Care/advanced ADL retraining, Therapeutic Activities, UE/LE Coordination activities, Cognitive remediation/compensation, Disease mangement/prevention, Functional mobility training, Patient/family education, Skin care/wound managment, Therapeutic Exercise, Community reintegration, Engineer, drilling, Neuromuscular re-education, Psychosocial support, Splinting/orthotics, UE/LE Strength taining/ROM, Wheelchair propulsion/positioning  SLP Interventions Functional tasks, Patient/family education, Speech/Language facilitation, Therapeutic Activities, Dysphagia/aspiration precaution training, Multimodal communication approach,  Internal/external aids, Cueing hierarchy, Oral motor exercises, Cognitive remediation/compensation  TR Interventions    SW/CM Interventions Discharge Planning, Psychosocial Support, Patient/Family Education   Barriers to Discharge MD  Medical stability, Home enviroment access/loayout, Trach, New diabetic, Neurogenic bowel and bladder, Wound care, Lack of/limited family support, Insurance for SNF coverage, Weight and Nutritional means  Nursing Trach, Incontinence, Wound  Care, Nutrition means    PT Home environment access/layout, New diabetic, New oxygen, Wound Care, Trach    OT      SLP      SW Other (comments) Uninsured   Team Discharge Planning: Destination: PT-Home ,OT- Home , SLP-Home Projected Follow-up: PT-Home health PT, OT-  Home health OT, Outpatient OT, SLP-Home Health SLP Projected Equipment Needs: PT-To be determined, OT- To be determined, SLP-Other (comment)(PMSV) Equipment Details: PT-Patient does not have any equipment at home, will determine appropriate DME based on patient's progress, OT-  Patient/family involved in discharge planning: PT- Patient,  OT-Patient, SLP-Patient  MD ELOS: 3-4 weeks Medical Rehab Prognosis:  Fair Assessment: Pt is a 54 yr old male with no PMHx who developed COVID- now has a trach- dysphonia, dysphagia with TFs, resting tachycardia, ABLA, gangrenous toes on B/L feet _ he's requiring hydrotherapy for buttocks wound.   Pt requires therapy for endurance, gait, transfers, ADLs, and hydrotherapy with IV pain meds.   Goals min assist   See Team Conference Notes for weekly updates to the plan of care

## 2019-03-26 NOTE — Progress Notes (Signed)
Physical Therapy Session Note  Patient Details  Name: Andrew Bruce MRN: [id number] Date of Birth: 1965-06-29  Today's Date: 03/26/2019 PT Individual Time: 1300-1400 PT Individual Time Calculation (min): 60 min   Short Term Goals: Week 1:  PT Short Term Goal 1 (Week 1): Patient will perform bed mobility with supervision with HOB elevated to 30 degrees. PT Short Term Goal 2 (Week 1): Patient will perofrm basic transfers with max assist. PT Short Term Goal 3 (Week 1): Patient will tolerated sitting in w/c or recliner >1 hour. PT Short Term Goal 4 (Week 1): Patient will initiate standing activities using LRAD with max A. Week 2:    Week 3:     Skilled Therapeutic Interventions/Progress Updates:    PAIN denies pain  Pt initially supine and agreeable to treatment.  Supine to sit w/cues for sequncing, min assist. 02 sats 96% on 2L02 VIA TRACH COLLAR. STS from bed w/min assist, repeated x 2 w/emphasis on core control/balance w/transition. SPT bed to wc wmin to mod S Pt transported to gym for continued session.  Parallel bars strengthening and balance activities: Marching x 8, 02sats 93, hr 113 Standing hip abd w/vcs and mirror for feeback, therapist guarding stance knee x 8-10 each 02sats 93, hr 116 Standing minisquats w/cues for technique x 8, increased RR 02 sats 98, HR 111  Seated resisted hip abd w/orange TB resistance 3x10 Resisted hs curls w/OTB resistance 3x10 each   Standing forward press w/1lb bar x 10, seated rest, repeated x 10, min assist for balance and to facilitate use of abs/post tendency due to abd/core instability.  Pt transported back to room at end of session.  SPT wc to bed w/mod assist of 1. Scoots in sitting w/cga.  Maintains balance in sitting while therapist removes shoes.  Sit to supine w/min assist and cues for sequencing/hand placement. All lines/leads reattached. 02 sats at end of session 96%. Pt w/questions regarding his progress and discussed that he  was making good progress in all areas of functional mobility,strength, endurance.  Also discussed anticipated slow/steady progress in all areas.  Pt left supine w/rails up x 4, alarm set, bed in lowest position, and needs in reach.    Therapy Documentation Precautions:  Precautions Precautions: Fall, Other (comment) Precaution Comments: PMSV at all times, PEG;  NPO Restrictions Weight Bearing Restrictions: No Other Position/Activity Restrictions: HOB elevated 30 degrees at all times   Therapy/Group: Individual Therapy  Rada Hay, PT   Shearon Balo 03/26/2019, 3:38 PM

## 2019-03-27 ENCOUNTER — Inpatient Hospital Stay (HOSPITAL_COMMUNITY): Payer: Self-pay | Admitting: Speech Pathology

## 2019-03-27 ENCOUNTER — Inpatient Hospital Stay (HOSPITAL_COMMUNITY): Payer: Self-pay | Admitting: Physical Therapy

## 2019-03-27 ENCOUNTER — Inpatient Hospital Stay (HOSPITAL_COMMUNITY): Payer: Self-pay | Admitting: Occupational Therapy

## 2019-03-27 LAB — GLUCOSE, CAPILLARY
Glucose-Capillary: 139 mg/dL — ABNORMAL HIGH (ref 70–99)
Glucose-Capillary: 240 mg/dL — ABNORMAL HIGH (ref 70–99)
Glucose-Capillary: 244 mg/dL — ABNORMAL HIGH (ref 70–99)
Glucose-Capillary: 214 mg/dL — ABNORMAL HIGH (ref 70–99)

## 2019-03-27 NOTE — Progress Notes (Signed)
Patient resting throughout shift in no acute distress or discomfort, alert and respond with nodding and hand gestures to questions appropriately. Tracheostomy tube remains in place with passy muir valve and tolerated well, humidified oxygen remains as ordered,, tolerated suctioning with amount of clear secretions noted, trach standard care provided. G-Tube in place and Osmolite feeding provided without difficulties, External catheter applied.refer to assessment as needed., monitor and assisted

## 2019-03-27 NOTE — Progress Notes (Signed)
Occupational Therapy Session Note  Patient Details  Name: Audry Pecina MRN: [id number] Date of Birth: 01/06/1966  Today's Date: 03/27/2019 OT Individual Time: 1100-1157 OT Individual Time Calculation (min): 57 min    Short Term Goals: Week 1:  OT Short Term Goal 1 (Week 1): Pt will tolerate out of bed for 2-3 hrs a day OT Short Term Goal 2 (Week 1): Pt will perform UB dressing wth mod A OT Short Term Goal 3 (Week 1): Pt will dress LB clothing (brief/ pants) with max A OT Short Term Goal 4 (Week 1): Pt will transfer to toilet with max A +1  Skilled Therapeutic Interventions/Progress Updates:    Patient in bed, alert, interpreter present for session.  Patient states that he is feeling okay.  He denies pain.  Supine to sitting edge of bed with min a.  Sit to stand and SPT to TIS w/c with min/mod A.  Completed UB bathing seated at sink with min A.  LB bathing dependent.  Oral care with suction brush min A.  OH shirt mod A, pants and incontinence brief max A.  Completed standing for 1 minute x2 with c/o dizziness.  BP upon sitting 108/82.  O2 saturation remained in 90's t/o session.  Patient returned to bed at close of session with min A for sitting to supine and positioning in bed.  Bed alarm set and call bell in reach.    Therapy Documentation Precautions:  Precautions Precautions: Fall, Other (comment) Precaution Comments: PMSV at all times, PEG;  NPO Restrictions Weight Bearing Restrictions: No Other Position/Activity Restrictions: HOB elevated 30 degrees at all times General:   Vital Signs: Therapy Vitals Temp: 98.6 F (37 C) Pulse Rate: (!) 103 Resp: 17 BP: 124/85 Patient Position (if appropriate): Lying Oxygen Therapy SpO2: 99 % O2 Device: Room Air   Therapy/Group: Individual Therapy  [name] 03/27/2019, 7:39 AM

## 2019-03-27 NOTE — Progress Notes (Signed)
Physical Therapy Session Note  Patient Details  Name: Andrew Bruce MRN: [id number] Date of Birth: 17-Apr-1965  Today's Date: 03/27/2019 PT Individual Time: 1301-1410 PT Individual Time Calculation (min): 69 min   Short Term Goals: Week 1:  PT Short Term Goal 1 (Week 1): Patient will perform bed mobility with supervision with HOB elevated to 30 degrees. PT Short Term Goal 2 (Week 1): Patient will perofrm basic transfers with max assist. PT Short Term Goal 3 (Week 1): Patient will tolerated sitting in w/c or recliner >1 hour. PT Short Term Goal 4 (Week 1): Patient will initiate standing activities using LRAD with max A.  Skilled Therapeutic Interventions/Progress Updates: Pt presents supine in bed, agreeable to treatment.  Total assist for donning ace wraps to bilateral LEs to knees.  Pt required min A for sup to sit transfers and sat EOB.  Pt c/o slight dizziness through interpreter.  Pt O2 sats decreased to 87% w/ sitting, requiring seated rest break.  Pt performed LE ther ex at EOB calf raises, LAQ, hip flexion 3 x 10.  Pt required min A to maintain seated balance .  Pt performed sit to stand w/ mod A and RW.  Pt able to step-pivot w/ mod A bed <> w/c.  O2 sats decreased to 88% and required extended seated rest break to increase to 91%.  Pt returned to bed needing to use bed pan.  Nursing in room to assist.     Therapy Documentation Precautions:  Precautions Precautions: Fall, Other (comment) Precaution Comments: PMSV at all times, PEG;  NPO Restrictions Weight Bearing Restrictions: No Other Position/Activity Restrictions: HOB elevated 30 degrees at all times General:   Vital Signs: Therapy Vitals Temp: 98.2 F (36.8 C) Temp Source: Oral Pulse Rate: (!) 106 Resp: 18 BP: 108/82 Patient Position (if appropriate): Sitting Oxygen Therapy SpO2: 95 % O2 Device: Tracheostomy Collar O2 Flow Rate (L/min): 5 L/min FiO2 (%): 28 % Pain: no c/o pain through interpreter, only slight  dizziness.      Therapy/Group: Individual Therapy  [name] 03/27/2019, 2:16 PM

## 2019-03-27 NOTE — Plan of Care (Signed)
  Problem: RH BOWEL ELIMINATION Goal: RH STG MANAGE BOWEL WITH ASSISTANCE Description: STG Manage Bowel with mod I Assistance. Outcome: Not Progressing;incontinent   Problem: RH BLADDER ELIMINATION Goal: RH STG MANAGE BLADDER WITH ASSISTANCE Description: STG Manage Bladder With mod I Assistance Outcome: Not Progressing; incontinent

## 2019-03-27 NOTE — Progress Notes (Signed)
Physical Therapy Wound Treatment Patient Details  Name: Andrew Bruce MRN: [id number] Date of Birth: 10-08-1965  Today's Date: 03/27/2019 PT Individual Time: 1005-1029 PT Individual Time Calculation (min): 24 min   Subjective  Subjective: Pt supine in bed on arrival, agreeable to session.  RN gave IV pain meds this am pre tx.   Patient and Family Stated Goals: None stated Prior Treatments: treatment on acute  Pain Score:  FACES 4/10  Wound Assessment  Pressure Injury 02/22/19 Coccyx Medial Unstageable - Full thickness tissue loss in which the base of the injury is covered by slough (yellow, tan, gray, green or brown) and/or eschar (tan, brown or black) in the wound bed. quarter-sized/purple  (Active)  Wound Image   03/27/19 1315  Dressing Type Foam - Lift dressing to assess site every shift;Barrier Film (skin prep);Moist to dry 03/27/19 1315  Dressing Changed 03/27/19 1315  Dressing Change Frequency Daily 03/27/19 1315  State of Healing Other (Comment) 03/27/19 1315  Site / Wound Assessment Yellow;Pink 03/27/19 1315  % Wound base Red or Granulating 30% 03/27/19 1315  % Wound base Yellow/Fibrinous Exudate 70% 03/27/19 1315  % Wound base Black/Eschar 0% 03/27/19 1315  % Wound base Other/Granulation Tissue (Comment) 0% 03/27/19 1315  Peri-wound Assessment Intact 03/27/19 0815  Wound Length (cm) 5 cm 03/24/19 1531  Wound Width (cm) 2 cm 03/24/19 1531  Wound Depth (cm) 0.7 cm 03/24/19 1531  Wound Surface Area (cm^2) 10 cm^2 03/24/19 1531  Wound Volume (cm^3) 7 cm^3 03/24/19 1531  Tunneling (cm) 0 03/23/19 2122  Undermining (cm) 0 03/23/19 2122  Margins Unattached edges (unapproximated) 03/27/19 1315  Drainage Amount Moderate 03/27/19 1315  Drainage Description Sanguineous;No odor 03/27/19 1315  Treatment Cleansed;Debridement (Selective);Hydrotherapy (Pulse lavage);Packing (Saline gauze) 03/27/19 1315     Santyl applied to wound bed prior to applying dressing.  Hydrotherapy  Pulsed lavage therapy - wound location: Sacrum Pulsed Lavage with Suction (psi): 12 psi(4-12) Pulsed Lavage with Suction - Normal Saline Used: 1000 mL Pulsed Lavage Tip: Tip with splash shield Selective Debridement Selective Debridement - Location: Sacrum Selective Debridement - Tools Used: Forceps;Scissors(able to debride more adherent slough with scissors.  ) Selective Debridement - Tissue Removed: Thick, tenacious, yellow eschar   Wound Assessment and Plan  Wound Therapy - Assess/Plan/Recommendations Wound Therapy - Clinical Statement: Wound continues to present with adherent yellow eschar and partial areas of pink granulation.  Pt continues to benefit from hydrotherapy for decreasing bio burden and for selective debridement to remove adherent eshar and promote granulation.  Goal is to continue to decrease necrosis and bioburden in wound bed.   Wound Therapy - Functional Problem List: Global weakness s/p COVID, CVA's, aortic clot Factors Delaying/Impairing Wound Healing: Multiple medical problems;Immobility Hydrotherapy Plan: Debridement;Dressing change;Patient/family education;Pulsatile lavage with suction Wound Therapy - Frequency: 6X / week Wound Therapy - Follow Up Recommendations: Home health RN Wound Plan: See above  Wound Therapy Goals- Improve the function of patient's integumentary system by progressing the wound(s) through the phases of wound healing (inflammation - proliferation - remodeling) by: Decrease Necrotic Tissue to: 10 Decrease Necrotic Tissue - Progress: Progressing toward goal Increase Granulation Tissue to: 90 Increase Granulation Tissue - Progress: Progressing toward goal Goals/treatment plan/discharge plan were made with and agreed upon by patient/family: Yes Time For Goal Achievement: 7 days Wound Therapy - Potential for Goals: Good  Goals will be updated until maximal potential achieved or discharge criteria met.  Discharge criteria: when goals achieved,  discharge from hospital, MD decision/surgical intervention, no progress towards  goals, refusal/missing three consecutive treatments without notification or medical reason.  GP     Reeder Brisby Eli Hose 03/27/2019, 1:25 PM Erasmo Leventhal , PTA Acute Rehabilitation Services Pager [phone] Office [phone]

## 2019-03-27 NOTE — Progress Notes (Signed)
Speech Language Pathology Daily Session Note  Patient Details  Name: Andrew Bruce MRN: [id number] Date of Birth: 1966/01/09  Today's Date: 03/27/2019 SLP Individual Time: 0122-2411 SLP Individual Time Calculation (min): 53 min  Short Term Goals: Week 1: SLP Short Term Goal 1 (Week 1): Pt will complete oropharyngeal strengthening exercises to maximize swallow function and safety for return to safe PO intake SLP Short Term Goal 2 (Week 1): Pt will complete 5 repetitions with EMST 150 device set at 30 cmH20 and IMST device set at 10 cmH20  with mod A verbal instruction. SLP Short Term Goal 3 (Week 1): Pt will consume ice chips at bedside to rehabilitate musculature and to increase ROM. SLP Short Term Goal 3 - Progress (Week 1): Revised due to lack of progress SLP Short Term Goal 4 (Week 1): Pt will communicate basic wants and needs with Mod A verbal cues SLP Short Term Goal 5 (Week 1): Pt will demonstrate orientation x4 with mod A multimodal cues SLP Short Term Goal 5 - Progress (Week 1): Met SLP Short Term Goal 6 (Week 1): Pt will demonstrate donning and doffing of PMSV with mod A multimodal cues  Skilled Therapeutic Interventions:    Pt didn't know any information about vocal cord function or the impact that his vocal cord dysfunction has on eating/speaking.  Skilled treatment session targeted extensive education on vocal cords, their function and description provided on pt's vocal cord dysfunction. Pt demonstrated initial understanding but will need this information to be reviewed.   This Probation officer reached out to pt's MD regarding plan for ENT. Evidently pt had previously refused ENT consult likely d/t poor health literacy in this area. SLP visited pt again while live interpreter was present to explain need for ENT assess. Pt with increased understanding and agreed to ENT consult, assessment and possible treatment to help dysfunction. This Probation officer made MD aware of pt's decision.     Pain     Therapy/Group: Individual Therapy  Andrew Bruce 03/27/2019, 3:54 PM

## 2019-03-27 NOTE — Progress Notes (Signed)
[place] PHYSICAL MEDICINE & REHABILITATION PROGRESS NOTE   Subjective/Complaints:   Pt Had LBM this AM- pt reports he initially didn't want to see ENT back, however after d/w SLP and interpretor explaining won't eat if doesn't do scope- he decided willing to have ENT come back.   Doing dressing change on backside/sacrum. SOB is "stable".     ROS: Pt denies SOB, abd pain, CP, N/V/C/D, and vision changes     Objective:   No results found. Recent Labs    03/25/19 0407  WBC 9.3  HGB 10.0*  HCT 33.3*  PLT 470*   Recent Labs    03/25/19 0407  NA 137  K 3.7  CL 98  CO2 28  GLUCOSE 53*  BUN 16  CREATININE <0.30*  CALCIUM 8.5*    Intake/Output Summary (Last 24 hours) at 03/27/2019 1446 Last data filed at 03/27/2019 0645 Gross per 24 hour  Intake 710 ml  Output 1300 ml  Net -590 ml     Physical Exam: Vital Signs Blood pressure 108/82, pulse (!) 106, temperature 98.2 F (36.8 C), temperature source Oral, resp. rate 18, height 5\' 6"  (1.676 m), weight (!) 0.003 kg, SpO2 95 %.  Nursing note and vitals reviewed. Constitutional: pt appears sad, on side in bed; getting wound car eon sacrum by nursing, NAD Throat: trach in place- PMV in place; 35% FiO2- asked nursing to try 28% dry chapped lips Cardiovascular: little tachycardic; regular rhythm Pulm: little coarse at L base, but otherwise CTA B/L- good air movement GI: soft, NT, ND, (+)BS hypoactive Musculoskeletal:        General: No edema.     Comments:  B/L toes - 1st toes still black but other ties loooking better as skin peels.   Diffuse weakness without focal deficits 4/5 in bilateral UE and 3/5 in bilateral LE.  Neurological: He is alert.   Skin: He is not diaphoretic.    Slough notable but granulating at edges Psych: flattened affect   Assessment/Plan: 1. 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  Physiatrist is providing close team supervision and 24 hour management of active medical problems listed below.  Physiatrist and rehab team continue to assess barriers to discharge/monitor patient progress toward functional and medical goals  Care Tool:  Bathing    Body parts bathed by patient: Right arm, Left arm, Chest, Abdomen, Face   Body parts bathed by helper: Front perineal area, Buttocks, Right upper leg, Left upper leg, Right lower leg, Left lower leg     Bathing assist Assist Level: Moderate Assistance - Patient 50 - 74%     Upper Body Dressing/Undressing Upper body dressing   What is the patient wearing?: Pull over shirt    Upper body assist Assist Level: Minimal Assistance - Patient > 75%    Lower Body Dressing/Undressing Lower body dressing      What is the patient wearing?: Pants     Lower body assist Assist for lower body dressing: Maximal Assistance - Patient 25 - 49%     Toileting Toileting Toileting Activity did not occur (Clothing management and hygiene only): N/A (no void or bm)(foley)  Toileting assist Assist for toileting: Dependent - Patient 0%     Transfers Chair/bed transfer  Transfers assist  Chair/bed transfer activity did not occur: Safety/medical concerns(limited by dizziness, chest pain)  Chair/bed transfer assist level: Moderate Assistance -  Patient 50 - 74%     Locomotion Ambulation   Ambulation assist   Ambulation activity did not occur: Safety/medical concerns(limited by dizziness, chest pain)  Assist level: Moderate Assistance - Patient 50 - 74% Assistive device: Walker-rolling(B Darco shoes) Max distance: 2 ft   Walk 10 feet activity   Assist  Walk 10 feet activity did not occur: Safety/medical concerns(limited by dizziness, chest pain)        Walk 50 feet activity   Assist Walk 50 feet with 2 turns activity did not occur: Safety/medical concerns(limited by  dizziness, chest pain)         Walk 150 feet activity   Assist Walk 150 feet activity did not occur: Safety/medical concerns(limited by dizziness, chest pain)         Walk 10 feet on uneven surface  activity   Assist Walk 10 feet on uneven surfaces activity did not occur: Safety/medical concerns(limited by dizziness, chest pain)         Wheelchair     Assist     Wheelchair activity did not occur: Safety/medical concerns(limited by dizziness, chest pain)         Wheelchair 50 feet with 2 turns activity    Assist    Wheelchair 50 feet with 2 turns activity did not occur: Safety/medical concerns(limited by dizziness, chest pain)       Wheelchair 150 feet activity     Assist  Wheelchair 150 feet activity did not occur: Safety/medical concerns(limited by dizziness, chest pain)       Blood pressure 108/82, pulse (!) 106, temperature 98.2 F (36.8 C), temperature source Oral, resp. rate 18, height 5\' 6"  (1.676 m), weight (!) 0.003 kg, SpO2 95 %.  1.  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              -patient may shower  3/12- pt reports problems with Darco shoes- couldn't really weight bear well- asked teamt o use traction socks until family could bring in shoes 1-2 sizes larger. Don't need to wear the shoes- no pain on toes/feet.              -ELOS/Goals: 2-3 weeks minA in PT, OT, SLP 2.  Antithrombotics: -DVT/anticoagulation:  Pharmaceutical: Other (comment)--Eliquis             -antiplatelet therapy: N/A 3. Pain Management: Fentanyl IV prior to hydrotherapy. Will add oxycodone prn for sacral pain.   3/13- pain doing better- con't regimen for now.  4. Mood: LCSW to follow for evaluation and support.              -antipsychotic agents: N/A 5. Neuropsych: This patient appears to be capable of making decisions on his own behalf. 6. Sacral decubitus/Skin/Wound Care: continue air mattress overlay.   Hydrotherapy 6 days a week. Tube feeds with additional protein supplement and vitamins to help promote wound healing.   3/13- have wet to dry on days not getting hydrotherapy. 7. Fluids/Electrolytes/Nutrition: Monitor I/O. Check lytes in am and weekly.  8. T2DM: Hgb A1c- 11.7--continue Levemir bid with SSI every 4 hours for elevated BS. Will consult dietician to start bolus tube feeds.    CBG (last 3)  Recent Labs    03/26/19 1714 03/27/19 0618 03/27/19 1146  GLUCAP 153* 139* 240*   3/10- adequate BGs- will monitor for trend before changing.   3/11- BGs dropped- will decrease Levemir to 27 units BID  3/12- will drop Levimir to 25  mg BID since having low BGs still.  9. Dizziness: Will change Cardura to bedtime to avoid SE. Will monitor orthostatic BPs 10. 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   3/11- will add Duonebs QID- not prn-   3/12- improved SOB- con't regimen- discussed with resp therapy.  12. Resting tachycardia: Due to deconditioning. No BB due to soft BP/orthostatic symptoms.  Monitor for now.   3/11- better- rate of 97-98 this AM- con't meds 13. Gangrenous changes bilateral feet: Will order off loading shoes. Continue to monitor.   3/11- stable- will con't to monitor  3/12- will not use off loading shoes- not working for pt at all- too unwieldy- will use tractions socks for now.   3/13- needs to have dry skin rubbed off with wet washcloth- but gangrene improving esp on 2nd-5th toes.  14. Abnormal LFTs: Recheck labs in am.   3/10- mild elevation of ALT only- will con't to monitor 15. ABLA: Due to critical illness. Will check anemia panel.   3/10- looking better- con't treatment 16. Right Vocal fold immobility, dysphonia:s/p transnasal fiberoptic laryngoscopy by ENT on 3/9 but was aborted due to gagging and vomiting. ENT will re-attempt in a couple of days.   3/11- could hear a few words using PMV this AM- will con't to monitor  3/13- pt  NOW willing to have ENT come back for scope since now understands won't eat if doesn't do. Call ENT Monday  17. Leukocytosis-  3/10- pt is afebrile- no specific signs of illness, however WBC is up to 16k- will reorder labs in AM and check CXR and U/A and Cx to start.   3/11- WBC down to 9.3 and no left shift- U?A not done yet- will monitor 17. Disposition: Patient lives with wife and 5 children aged 8-23 who will provide support upon discharge.    3/11- d/c PICC since not using  LOS: 4 days A FACE TO FACE EVALUATION WAS PERFORMED  Zale Marcotte 03/27/2019, 2:46 PM

## 2019-03-28 LAB — GLUCOSE, CAPILLARY
Glucose-Capillary: 74 mg/dL (ref 70–99)
Glucose-Capillary: 133 mg/dL — ABNORMAL HIGH (ref 70–99)
Glucose-Capillary: 78 mg/dL (ref 70–99)
Glucose-Capillary: 229 mg/dL — ABNORMAL HIGH (ref 70–99)
Glucose-Capillary: 115 mg/dL — ABNORMAL HIGH (ref 70–99)
Glucose-Capillary: 147 mg/dL — ABNORMAL HIGH (ref 70–99)
Glucose-Capillary: 235 mg/dL — ABNORMAL HIGH (ref 70–99)

## 2019-03-28 LAB — URINALYSIS, ROUTINE W REFLEX MICROSCOPIC
Bilirubin Urine: NEGATIVE
Glucose, UA: 50 mg/dL — AB
Hgb urine dipstick: NEGATIVE
Ketones, ur: NEGATIVE mg/dL
Leukocytes,Ua: NEGATIVE
Nitrite: NEGATIVE
Protein, ur: NEGATIVE mg/dL
Specific Gravity, Urine: 1.019 (ref 1.005–1.030)
pH: 7 (ref 5.0–8.0)

## 2019-03-28 MED ORDER — INSULIN DETEMIR 100 UNIT/ML ~~LOC~~ SOLN
27.0000 [IU] | Freq: Two times a day (BID) | SUBCUTANEOUS | Status: DC
  Administered 2019-03-28 – 2019-03-29 (×3): 27 [IU] via SUBCUTANEOUS
  Filled 2019-03-28 (×7): qty 0.27

## 2019-03-28 NOTE — Plan of Care (Signed)
  Problem: RH BOWEL ELIMINATION Goal: RH STG MANAGE BOWEL WITH ASSISTANCE Description: STG Manage Bowel with mod I Assistance. Outcome: Not Progressing; incontinence   Problem: RH BLADDER ELIMINATION Goal: RH STG MANAGE BLADDER WITH ASSISTANCE Description: STG Manage Bladder With mod I Assistance Outcome: Not Progressing; incontinence; condom cath   Problem: RH SKIN INTEGRITY Goal: RH STG SKIN FREE OF INFECTION/BREAKDOWN Description: No new areas of break down while on CIR Outcome: Not Progressing; hydrotherapy; necrotic toes

## 2019-03-28 NOTE — Progress Notes (Signed)
PHYSICAL MEDICINE & REHABILITATION PROGRESS NOTE   Subjective/Complaints:   Pt reports R shoulder pain is doing better at rest and with PT/OT.   Wants to see ENT to do scope- verified with pt.   Denies SOB; breathing much better;    ROS:  Pt denies SOB, abd pain, CP, N/V/C/D, and vision changes      Objective:   No results found. No results for input(s): WBC, HGB, HCT, PLT in the last 72 hours. No results for input(s): NA, K, CL, CO2, GLUCOSE, BUN, CREATININE, CALCIUM in the last 72 hours.  Intake/Output Summary (Last 24 hours) at 03/28/2019 1057 Last data filed at 03/28/2019 0612 Gross per 24 hour  Intake 415 ml  Output 1575 ml  Net -1160 ml     Physical Exam: Vital Signs Blood pressure 109/84, pulse (!) 106, temperature 99 F (37.2 C), resp. rate 18, height 5\' 6"  (1.676 m), weight (!) 0.003 kg, SpO2 97 %.  Nursing note and vitals reviewed. Constitutional: pt laying on L side in bed; sleeping initially, appropriate, NAD Throat: trach in place; PMV has come off and to side- likely due to coughing, FiO2 28% via trach Cardiovascular: stable mild tachycardia; regular rhythm Pulm:much less coarse- almost completely CTA B/L today- occ cough that sounded juicy/coarse GI: soft, NT, ND, (+)BS (+)PEG Musculoskeletal:        General: No edema.     Comments:  B/L toes gangrenous but overall bettter- less intense black and peeling a lot.    Diffuse weakness without focal deficits 4/5 in bilateral UE and 3/5 in bilateral LE.  Neurological: Ox3  Skin: He is not diaphoretic.Pic 3/13    Slough notable but granulating at edges Psych:brighter affect   Assessment/Plan: 1. 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  Physiatrist is providing close team supervision and 24 hour management of active medical problems listed  below.  Physiatrist and rehab team continue to assess barriers to discharge/monitor patient progress toward functional and medical goals  Care Tool:  Bathing    Body parts bathed by patient: Right arm, Left arm, Chest, Abdomen, Face   Body parts bathed by helper: Front perineal area, Buttocks, Right upper leg, Left upper leg, Right lower leg, Left lower leg     Bathing assist Assist Level: Moderate Assistance - Patient 50 - 74%     Upper Body Dressing/Undressing Upper body dressing   What is the patient wearing?: Pull over shirt    Upper body assist Assist Level: Minimal Assistance - Patient > 75%    Lower Body Dressing/Undressing Lower body dressing      What is the patient wearing?: Pants     Lower body assist Assist for lower body dressing: Maximal Assistance - Patient 25 - 49%     Toileting Toileting Toileting Activity did not occur (Clothing management and hygiene only): N/A (no void or bm)(foley)  Toileting assist Assist for toileting: Dependent - Patient 0%     Transfers Chair/bed transfer  Transfers assist  Chair/bed transfer activity did not occur: Safety/medical concerns(limited by dizziness, chest pain)  Chair/bed transfer assist level: Moderate Assistance - Patient 50 - 74%     Locomotion Ambulation   Ambulation assist   Ambulation activity did not occur: Safety/medical concerns(limited by dizziness, chest pain)  Assist level: Moderate Assistance - Patient 50 - 74% Assistive device: Walker-rolling(B Darco shoes) Max distance:  2 ft   Walk 10 feet activity   Assist  Walk 10 feet activity did not occur: Safety/medical concerns(limited by dizziness, chest pain)        Walk 50 feet activity   Assist Walk 50 feet with 2 turns activity did not occur: Safety/medical concerns(limited by dizziness, chest pain)         Walk 150 feet activity   Assist Walk 150 feet activity did not occur: Safety/medical concerns(limited by dizziness,  chest pain)         Walk 10 feet on uneven surface  activity   Assist Walk 10 feet on uneven surfaces activity did not occur: Safety/medical concerns(limited by dizziness, chest pain)         Wheelchair     Assist     Wheelchair activity did not occur: Safety/medical concerns(limited by dizziness, chest pain)         Wheelchair 50 feet with 2 turns activity    Assist    Wheelchair 50 feet with 2 turns activity did not occur: Safety/medical concerns(limited by dizziness, chest pain)       Wheelchair 150 feet activity     Assist  Wheelchair 150 feet activity did not occur: Safety/medical concerns(limited by dizziness, chest pain)       Blood pressure 109/84, pulse (!) 106, temperature 99 F (37.2 C), resp. rate 18, height 5\' 6"  (1.676 m), weight (!) 0.003 kg, SpO2 97 %.  1.  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              -patient may shower  3/12- pt reports problems with Darco shoes- couldn't really weight bear well- asked teamt o use traction socks until family could bring in shoes 1-2 sizes larger. Don't need to wear the shoes- no pain on toes/feet.              -ELOS/Goals: 2-3 weeks minA in PT, OT, SLP 2.  Antithrombotics: -DVT/anticoagulation:  Pharmaceutical: Other (comment)--Eliquis             -antiplatelet therapy: N/A 3. Pain Management: Fentanyl IV prior to hydrotherapy. Will add oxycodone prn for sacral pain.   3/13- pain doing better- con't regimen for now.   3/14- R shoulder pain is doing better  4. Mood: LCSW to follow for evaluation and support.              -antipsychotic agents: N/A 5. Neuropsych: This patient appears to be capable of making decisions on his own behalf. 6. Sacral decubitus/Skin/Wound Care: continue air mattress overlay.  Hydrotherapy 6 days a week. Tube feeds with additional protein supplement and vitamins to help promote wound healing.   3/13- have wet to dry  on days not getting hydrotherapy. 7. Fluids/Electrolytes/Nutrition: Monitor I/O. Check lytes in am and weekly.  8. T2DM: Hgb A1c- 11.7--continue Levemir bid with SSI every 4 hours for elevated BS. Will consult dietician to start bolus tube feeds.    CBG (last 3)  Recent Labs    03/27/19 1653 03/27/19 2106 03/28/19 0953  GLUCAP 244* 214* 229*   3/10- adequate BGs- will monitor for trend before changing.   3/11- BGs dropped- will decrease Levemir to 27 units BID  3/12- will drop Levimir to 25 mg BID since having low BGs still.  3/14- increase Levemir to 27 units BID  9. Dizziness: Will change Cardura to bedtime to avoid SE. Will monitor orthostatic Bps  3/14- BP on soft side and slightly  tachycardic, likely due to breahting meds and overall deconditioning- con't to monitor 10. 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   3/11- will add Duonebs QID- not prn-   3/12- improved SOB- con't regimen- discussed with resp therapy.  3/14- con't Duonebs- doing better- might be able ot reduce breathing meds to TID scheduled as of Monday- determine during rounds.   12. Resting tachycardia: Due to deconditioning. No BB due to soft BP/orthostatic symptoms.  Monitor for now.   3/11- better- rate of 97-98 this AM- con't meds  3/14- Pulse 106-110- still due to deconditioning- will con't to monitor- no B blocker 13. Gangrenous changes bilateral feet: Will order off loading shoes. Continue to monitor.   3/11- stable- will con't to monitor  3/12- will not use off loading shoes- not working for pt at all- too unwieldy- will use tractions socks for now.   3/13- needs to have dry skin rubbed off with wet washcloth- but gangrene improving esp on 2nd-5th toes.  14. Abnormal LFTs: Recheck labs in am.   3/10- mild elevation of ALT only- will con't to monitor 15. ABLA: Due to critical illness. Will check anemia panel.   3/10- looking better- con't treatment 16. Right Vocal fold  immobility, dysphonia:s/p transnasal fiberoptic laryngoscopy by ENT on 3/9 but was aborted due to gagging and vomiting. ENT will re-attempt in a couple of days.   3/11- could hear a few words using PMV this AM- will con't to monitor  3/13- pt NOW willing to have ENT come back for scope since now understands won't eat if doesn't do. Call ENT Monday   3/14- verified with pt.  17. Leukocytosis-  3/10- pt is afebrile- no specific signs of illness, however WBC is up to 16k- will reorder labs in AM and check CXR and U/A and Cx to start.   3/11- WBC down to 9.3 and no left shift- U?A not done yet- will monitor 17. Disposition: Patient lives with wife and 5 children aged 63-23 who will provide support upon discharge.    3/11- d/c PICC since not using  LOS: 5 days A FACE TO FACE EVALUATION WAS PERFORMED  Maks Cavallero 03/28/2019, 10:57 AM

## 2019-03-29 ENCOUNTER — Inpatient Hospital Stay (HOSPITAL_COMMUNITY): Payer: Self-pay | Admitting: Speech Pathology

## 2019-03-29 ENCOUNTER — Inpatient Hospital Stay (HOSPITAL_COMMUNITY): Payer: Self-pay

## 2019-03-29 ENCOUNTER — Inpatient Hospital Stay (HOSPITAL_COMMUNITY): Payer: Self-pay | Admitting: Occupational Therapy

## 2019-03-29 DIAGNOSIS — I6310 Cerebral infarction due to embolism of unspecified precerebral artery: Secondary | ICD-10-CM

## 2019-03-29 LAB — BASIC METABOLIC PANEL
Anion gap: 13 (ref 5–15)
BUN: 18 mg/dL (ref 6–20)
CO2: 28 mmol/L (ref 22–32)
Calcium: 9 mg/dL (ref 8.9–10.3)
Chloride: 97 mmol/L — ABNORMAL LOW (ref 98–111)
Creatinine, Ser: 0.42 mg/dL — ABNORMAL LOW (ref 0.61–1.24)
GFR calc Af Amer: >60 mL/min (ref >60)
GFR calc non Af Amer: >60 mL/min (ref >60)
Glucose, Bld: 88 mg/dL (ref 70–99)
Potassium: 4.6 mmol/L (ref 3.5–5.1)
Sodium: 138 mmol/L (ref 135–145)

## 2019-03-29 LAB — CBC
HCT: 37.6 % — ABNORMAL LOW (ref 39.0–52.0)
Hemoglobin: 11.4 g/dL — ABNORMAL LOW (ref 13.0–17.0)
MCH: 26.5 pg (ref 26.0–34.0)
MCHC: 30.3 g/dL (ref 30.0–36.0)
MCV: 87.2 fL (ref 80.0–100.0)
Platelets: 535 10*3/uL — ABNORMAL HIGH (ref 150–400)
RBC: 4.31 MIL/uL (ref 4.22–5.81)
RDW: 17.2 % — ABNORMAL HIGH (ref 11.5–15.5)
WBC: 8.3 10*3/uL (ref 4.0–10.5)
nRBC: 0 % (ref 0.0–0.2)

## 2019-03-29 LAB — GLUCOSE, CAPILLARY
Glucose-Capillary: 84 mg/dL (ref 70–99)
Glucose-Capillary: 113 mg/dL — ABNORMAL HIGH (ref 70–99)
Glucose-Capillary: 209 mg/dL — ABNORMAL HIGH (ref 70–99)
Glucose-Capillary: 235 mg/dL — ABNORMAL HIGH (ref 70–99)

## 2019-03-29 LAB — URINE CULTURE

## 2019-03-29 NOTE — Progress Notes (Signed)
Occupational Therapy Session Note  Patient Details  Name: Andrew Bruce MRN: [id number] Date of Birth: 1965-10-01  Today's Date: 03/29/2019 OT Individual Time: 1100-1202 OT Individual Time Calculation (min): 62 min    Short Term Goals: Week 1:  OT Short Term Goal 1 (Week 1): Pt will tolerate out of bed for 2-3 hrs a day OT Short Term Goal 2 (Week 1): Pt will perform UB dressing wth mod A OT Short Term Goal 3 (Week 1): Pt will dress LB clothing (brief/ pants) with max A OT Short Term Goal 4 (Week 1): Pt will transfer to toilet with max A +1  Skilled Therapeutic Interventions/Progress Updates:    Patient in bed, alert, denies pain.  Interpreter present for therapy session.  PMV in place, O2 via trach collar Donned bilateral LE ace wraps and socks dependently, he is able to donn pants and pull over hips at bed level with CS.  Supine to sitting with CS.  Sit to stand and SPT to TIS w/c with min a.  Completed hair care min a, washed face set up, suction system oral care with set up and CS.   Completed dynavision in seated position:  Note over/under reach in no particular area of board Trial 1 - whole screen 2 minutes both hands;  Rxn time = 4.44 sec Trial 2 - lower half of screen, 1 minute, right UE only = 8.57 sec Trial 3 - lower half of screen, 1 minute, left UE only = 2.73 sec  Completed bilateral UB exercises with focus on symmetry and posture, note motor control deficits on right side with good response to facilitation and strategies.  Standing with RW min a for 1-2 minutes, fatigue noted but no c/o dizziness.  Returned to room, remained seated in TIS w/c at patients request, placed in reclined position, seat belt alarm set and call bell in reach.    Therapy Documentation Precautions:  Precautions Precautions: Fall, Other (comment) Precaution Comments: PMSV at all times, PEG;  NPO Restrictions Weight Bearing Restrictions: No Other Position/Activity Restrictions: HOB elevated 30  degrees at all times General:   Vital Signs:     Other Treatments:     Therapy/Group: Individual Therapy  [name] 03/29/2019, 7:39 AM

## 2019-03-29 NOTE — Progress Notes (Signed)
Nutrition Follow-up  **RD working remotely**  DOCUMENTATION CODES:   Not applicable  INTERVENTION:  Continue bolus feeding: 388m (1.5 cartons) of Osmolite 1.5 cal QID  343mPro-stat TID  Free water per MD, currently 10062mID   Recommended tube feeding regimen will provide 2430 kcals, 134 grams protein, 1086 ml free water  Continue -1 packet Juven BID, each packet provides 95 calories, 2.5 grams of protein (collagen), and 9.8 grams of carbohydrate (3 grams sugar); also contains 7 grams of L-arginine and L-glutamine, 300 mg vitamin C, 15 mg vitamin E, 1.2 mcg vitamin B-12, 9.5 mg zinc, 200 mg calcium, and 1.5 g  Calcium Beta-hydroxy-Beta-methylbutyrate to support wound healing   NUTRITION DIAGNOSIS:   Inadequate oral intake related to inability to eat as evidenced by NPO status.  Ongoing.  GOAL:   Patient will meet greater than or equal to 90% of their needs  Met with tube feeding.  MONITOR:   PO intake, TF tolerance, Skin, Weight trends, Labs, I & O's  REASON FOR ASSESSMENT:   Consult Enteral/tube feeding initiation and management  ASSESSMENT:   Pt was originally admitted on 02/06/19 after 10 day history of Covid 19 and progressive shortness of breath x 48  Hours. Pt admitted to CIR on 03/23/19.  1/23 Admit 1/24 Intubated 1/25 B/L chest tubes placed 2/02 CT head: multiple ischemic infarcts  2/16 S/P tracheostomy and Cortrak placement (tip in the stomach) 2/24 S/P PEG 2/25 TF being resumed  Pt with new diabetes diagnosis.   Discussed pt with RN.  Current tube feeding regimen: 355m13m.5 cartons) Osmolite 1.5 cal QID, 30ml50m-stat TID, 100ml 67m water QID  Medications reviewed and include: Vitamin C, Pepcid, SSI, Levemir, MVI, Juven BID, Senokot-S, Zinc sulfate  Labs reviewed. CBGs 84-235  UOP: 900ml x44mours I/O: -7,260ml si3madmit  Diet Order:   Diet Order            Diet NPO time specified  Diet effective now              EDUCATION  NEEDS:   No education needs have been identified at this time  Skin:  Skin Assessment: Skin Integrity Issues: Skin Integrity Issues:: Other (Comment), Unstageable Unstageable: coccyx Other: necrotic toes  Last BM:  3/13  Height:   Ht Readings from Last 1 Encounters:  03/24/19 5' 6"  (1.676 m)    Weight:   Wt Readings from Last 1 Encounters:  03/29/19 74.4 kg    BMI:  Body mass index is 26.47 kg/m.  Estimated Nutritional Needs:   Kcal:  [id number]:  120-145 grams  Fluid:  >/=2L/d    Tymira Horkey ALarkin Ina, LDN RD pager number and weekend/on-call pager number located in [email or web address]

## 2019-03-29 NOTE — Progress Notes (Signed)
Physical Therapy Session Note  Patient Details  Name: Andrew Bruce MRN: [id number] Date of Birth: 1965-11-13  Today's Date: 03/29/2019 PT Individual Time: 1300-1410 PT Individual Time Calculation (min): 70 min   Short Term Goals: Week 1:  PT Short Term Goal 1 (Week 1): Patient will perform bed mobility with supervision with HOB elevated to 30 degrees. PT Short Term Goal 2 (Week 1): Patient will perofrm basic transfers with max assist. PT Short Term Goal 3 (Week 1): Patient will tolerated sitting in w/c or recliner >1 hour. PT Short Term Goal 4 (Week 1): Patient will initiate standing activities using LRAD with max A.  Skilled Therapeutic Interventions/Progress Updates:     Patient in bed 5L/min via trach collar with PMSV in place upon PT arrival. Patient alert and agreeable to PT session. Patient reported mild to moderate intermittent B toe pain during session, RN made aware. PT provided repositioning, rest breaks, and distraction as pain interventions throughout session.   Therapeutic Activity: Bed Mobility: Patient performed supine to sit with min A for trunk support and sit to supine with supervision with HOB elevated to 30 degrees and use of bed rails. Provided verbal cues for rolling to his side to push up to sitting and lifting LEs onto the bed. Transfers: Patient performed sit to/from stand x2 and stand pivot x4 with min A for balance and mild posterior lean without AD using B HHA. Provided verbal cues for forward weight shift and pushing up from stable surfaces.  Attempted to don patient's oversized tennis shoes with total A with non-skid socks donned. Patient reported increased pressure on toes and shoes removed. Continues with non-skid socks only for transfers and gait training.  Gait Training:  Patient ambulated 8 feet x2 using B HHA with hands on PTs shoulders with min A. Ambulated with step through gait pattern with decreased step length, decreased step height, and  intermittent posterior lean. Provided verbal cues for heel weight bearing to protect toes, paced breathing, and forward weight shift with manual facilitation to reduce posterior lean. Patient did report mild to moderate B toe pain with gait activities. Assessed toes before and after with no notable changes and patient in agreement.   Wheelchair Mobility:  Patient was transported in the w/c with total A throughout session for energy conservation and time management.  Neuromuscular Re-ed: Patient performed the following seated balance and motor control activities for increased breath support: -sitting posture with anterior pelvic tilt to improved posture and reduce sacral sitting 2x3-4 min -diaphragmatic breathing with tactile cues for activation 2x10 breaths, provided education on benefit of diaphragm support for improved lung capacity -seated tapping colored cones first PT would say the color then allow the patient to tap the cone, then PT provided a sequence of 4 colors and then the patient would have to touch the cones in that sequence. 4/4 correct bilaterally with first task 3/4 correct bilaterally on second task. Noted decreased motor control undershooting R LE>L  Patient in bed at end of session with breaks locked, bed alarm set, and all needs within reach. Patient remained on 5L/min via trach collar throughout session, O2 sats >93% throughout HR between 98-114 bpm.   Therapy Documentation Precautions:  Precautions Precautions: Fall, Other (comment) Precaution Comments: PMSV at all times, PEG;  NPO Restrictions Weight Bearing Restrictions: No Other Position/Activity Restrictions: HOB elevated 30 degrees at all times    Therapy/Group: Individual Therapy  Jeania Nater L Ayham Word PT, DPT  03/29/2019, 4:30 PM

## 2019-03-29 NOTE — Progress Notes (Signed)
Physical Therapy Wound Treatment Patient Details  Name: Andrew Bruce MRN: [id number] Date of Birth: October 05, 1965  Today's Date: 03/29/2019 PT Individual Time: 1975-8832 PT Individual Time Calculation (min): 29 min   Subjective  Subjective: Pt supine in bed on arrival, agreeable to session.   Patient and Family Stated Goals: None stated Prior Treatments: treatment on acute  Pain Score:  4/10  Medicated post therapy to pt request.   Wound Assessment  Pressure Injury 02/22/19 Coccyx Medial Unstageable - Full thickness tissue loss in which the base of the injury is covered by slough (yellow, tan, gray, green or brown) and/or eschar (tan, brown or black) in the wound bed. quarter-sized/purple  (Active)  Dressing Type Foam - Lift dressing to assess site every shift;Barrier Film (skin prep);Moist to dry 03/29/19 1107  Dressing Changed 03/29/19 1107  Dressing Change Frequency Daily 03/29/19 1107  State of Healing Other (Comment) 03/29/19 1107  Site / Wound Assessment Yellow;Pink 03/29/19 1107  % Wound base Red or Granulating 35% 03/29/19 1107  % Wound base Yellow/Fibrinous Exudate 65% 03/29/19 1107  % Wound base Black/Eschar 0% 03/29/19 1107  % Wound base Other/Granulation Tissue (Comment) 0% 03/29/19 1107  Peri-wound Assessment Intact 03/28/19 2100  Wound Length (cm) 5 cm 03/24/19 1531  Wound Width (cm) 2 cm 03/24/19 1531  Wound Depth (cm) 0.7 cm 03/24/19 1531  Wound Surface Area (cm^2) 10 cm^2 03/24/19 1531  Wound Volume (cm^3) 7 cm^3 03/24/19 1531  Tunneling (cm) 0 03/23/19 2122  Undermining (cm) 0 03/23/19 2122  Margins Unattached edges (unapproximated) 03/29/19 1107  Drainage Amount Moderate 03/29/19 1107  Drainage Description Sanguineous;No odor 03/29/19 1107  Treatment Cleansed;Debridement (Selective);Hydrotherapy (Pulse lavage);Packing (Saline gauze) 03/29/19 1107      Hydrotherapy Pulsed lavage therapy - wound location: Sacrum Pulsed Lavage with Suction (psi): 12  psi(4-12) Pulsed Lavage with Suction - Normal Saline Used: 1000 mL Pulsed Lavage Tip: Tip with splash shield Selective Debridement Selective Debridement - Location: Sacrum Selective Debridement - Tools Used: Forceps;Scissors(able to debride more adherent slough with scissors.  ) Selective Debridement - Tissue Removed: Thick, tenacious, yellow eschar   Wound Assessment and Plan  Wound Therapy - Assess/Plan/Recommendations Wound Therapy - Clinical Statement: Wound continues to present with adherent yellow eschar and partial areas of pink granulation.  Pt continues to benefit from hydrotherapy for decreasing bio burden and for selective debridement to remove adherent eshar and promote granulation.  Goal is to continue to decrease necrosis and bioburden in wound bed.   Wound Therapy - Functional Problem List: Global weakness s/p COVID, CVA's, aortic clot Factors Delaying/Impairing Wound Healing: Multiple medical problems;Immobility Hydrotherapy Plan: Debridement;Dressing change;Patient/family education;Pulsatile lavage with suction Wound Therapy - Frequency: 6X / week Wound Therapy - Follow Up Recommendations: Home health RN Wound Plan: See above  Wound Therapy Goals- Improve the function of patient's integumentary system by progressing the wound(s) through the phases of wound healing (inflammation - proliferation - remodeling) by: Decrease Necrotic Tissue to: 10 Decrease Necrotic Tissue - Progress: Progressing toward goal Increase Granulation Tissue to: 90 Increase Granulation Tissue - Progress: Progressing toward goal Goals/treatment plan/discharge plan were made with and agreed upon by patient/family: Yes Time For Goal Achievement: 7 days Wound Therapy - Potential for Goals: Good  Goals will be updated until maximal potential achieved or discharge criteria met.  Discharge criteria: when goals achieved, discharge from hospital, MD decision/surgical intervention, no progress towards goals,  refusal/missing three consecutive treatments without notification or medical reason.  GP     [name]   03/29/2019, 11:15 AM  03/29/2019  [name], PT Acute Rehabilitation Services [phone]  (pager) [phone]  (office)

## 2019-03-29 NOTE — Progress Notes (Signed)
[facility] PHYSICAL MEDICINE & REHABILITATION PROGRESS NOTE   Subjective/Complaints:   Pt reports R shoulder pain is doing better at rest and with PT/OT.   Wants to see ENT to do scope- verified with pt.   Denies SOB; breathing much better;    ROS:  Pt denies SOB, abd pain, CP, N/V/C/D, and vision changes      Objective:   No results found. Recent Labs    03/29/19 0524  WBC 8.3  HGB 11.4*  HCT 37.6*  PLT 535*   Recent Labs    03/29/19 0524  NA 138  K 4.6  CL 97*  CO2 28  GLUCOSE 88  BUN 18  CREATININE 0.42*  CALCIUM 9.0    Intake/Output Summary (Last 24 hours) at 03/29/2019 0859 Last data filed at 03/29/2019 0757 Gross per 24 hour  Intake 890 ml  Output 1200 ml  Net -310 ml     Physical Exam: Vital Signs Blood pressure 122/88, pulse 98, temperature 98.2 F (36.8 C), temperature source Oral, resp. rate 16, height 5\' 6"  (1.676 m), weight 74.4 kg, SpO2 95 %.  Nursing note and vitals reviewed.  General: No acute distress Mood and affect are appropriate Heart: Regular rate and rhythm no rubs murmurs or extra sounds Lungs: Clear to auscultation, breathing unlabored, no rales or wheezes Abdomen: Positive bowel sounds, soft nontender to palpation, nondistended Extremities: No clubbing, cyanosis, or edema Skin: No evidence of breakdown, no evidence of rash  Musculoskeletal:        General: No edema.     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  Physiatrist is providing close team supervision and 24 hour management of active medical problems listed below.  Physiatrist and rehab team continue to  assess barriers to discharge/monitor patient progress toward functional and medical goals  Care Tool:  Bathing    Body parts bathed by patient: Right arm, Left arm, Chest, Abdomen, Face   Body parts bathed by helper: Front perineal area, Buttocks, Right upper leg, Left upper leg, Right lower leg, Left lower leg     Bathing assist Assist Level: Moderate Assistance - Patient 50 - 74%     Upper Body Dressing/Undressing Upper body dressing   What is the patient wearing?: Pull over shirt    Upper body assist Assist Level: Minimal Assistance - Patient > 75%    Lower Body Dressing/Undressing Lower body dressing      What is the patient wearing?: Pants     Lower body assist Assist for lower body dressing: Maximal Assistance - Patient 25 - 49%     Toileting Toileting Toileting Activity did not occur (Clothing management and hygiene only): N/A (no void or bm)(foley)  Toileting assist Assist for toileting: Dependent - Patient 0%     Transfers Chair/bed transfer  Transfers assist  Chair/bed transfer activity did not occur: Safety/medical concerns(limited by dizziness, chest pain)  Chair/bed transfer assist level: Moderate Assistance - Patient 50 - 74%     Locomotion Ambulation   Ambulation assist   Ambulation activity did not occur: Safety/medical concerns(limited by dizziness, chest pain)  Assist level: Moderate Assistance - Patient 50 - 74% Assistive device: Walker-rolling(B  Darco shoes) Max distance: 2 ft   Walk 10 feet activity   Assist  Walk 10 feet activity did not occur: Safety/medical concerns(limited by dizziness, chest pain)        Walk 50 feet activity   Assist Walk 50 feet with 2 turns activity did not occur: Safety/medical concerns(limited by dizziness, chest pain)         Walk 150 feet activity   Assist Walk 150 feet activity did not occur: Safety/medical concerns(limited by dizziness, chest pain)         Walk 10 feet on uneven  surface  activity   Assist Walk 10 feet on uneven surfaces activity did not occur: Safety/medical concerns(limited by dizziness, chest pain)         Wheelchair     Assist     Wheelchair activity did not occur: Safety/medical concerns(limited by dizziness, chest pain)         Wheelchair 50 feet with 2 turns activity    Assist    Wheelchair 50 feet with 2 turns activity did not occur: Safety/medical concerns(limited by dizziness, chest pain)       Wheelchair 150 feet activity     Assist  Wheelchair 150 feet activity did not occur: Safety/medical concerns(limited by dizziness, chest pain)       Blood pressure 122/88, pulse 98, temperature 98.2 F (36.8 C), temperature source Oral, resp. rate 16, height 5\' 6"  (1.676 m), weight 74.4 kg, SpO2 95 %.  1.  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              -patient may shower  .              -ELOS/Goals: 2-3 weeks minA , Cont CIR  PT, OT, SLP 2.  Antithrombotics: -DVT/anticoagulation:  Pharmaceutical: Other (comment)--Eliquis             -antiplatelet therapy: N/A 3. Pain Management: Fentanyl IV prior to hydrotherapy. Will add oxycodone prn for sacral pain.   3/13- pain doing better- con't regimen for now.   3/14- R shoulder pain is doing better  4. Mood: LCSW to follow for evaluation and support.              -antipsychotic agents: N/A 5. Neuropsych: This patient appears to be capable of making decisions on his own behalf. 6. Sacral decubitus/Skin/Wound Care: continue air mattress overlay.  Hydrotherapy 6 days a week. Tube feeds with additional protein supplement and vitamins to help promote wound healing.   3/13- have wet to dry on days not getting hydrotherapy. 7. Fluids/Electrolytes/Nutrition: Monitor I/O. Check lytes in am and weekly.  8. T2DM: Hgb A1c- 11.7--continue Levemir bid with SSI every 4 hours for elevated BS. Will consult dietician to start bolus  tube feeds.    CBG (last 3)  Recent Labs    03/28/19 1640 03/28/19 2110 03/29/19 0627  GLUCAP 147* 235* 84   CBGs with some lability    3/14- increase Levemir to 27 units BID - am CBG on low side will need snack at noc 9. Dizziness: Will change Cardura to bedtime to avoid SE. Will monitor orthostatic Bps  3/14- BP on soft side and slightly tachycardic, likely due to breahting meds and overall deconditioning- con't to monitor 10. 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   3/11- will add Duonebs QID- not prn-   3/12- improved SOB- con't regimen- discussed with resp therapy.  3/14- con't Duonebs-TID .   12. Resting tachycardia: Due to deconditioning. No BB due to soft BP/orthostatic symptoms.  Monitor for now.    Vitals:   03/29/19 0325 03/29/19 0750  BP: 122/88   Pulse: 98   Resp: 16   Temp: 98.2 F (36.8 C)   SpO2: 96% 95%  mildly elevated likely due to deconditioning  13. Gangrenous changes bilateral feet: Will order off loading shoes. Continue to monitor.   Primarily affecting 1st and 2nd toes bilaterally - pedal and post tib pulses normal , likely small vessel infarcts  14. Abnormal LFTs: Recheck labs in am.   3/10- mild elevation of ALT only- will con't to monitor 15. ABLA: Due to critical illness. Will check anemia panel.   3/10- looking better- con't treatment 16. Right Vocal fold immobility, dysphonia:s/p transnasal fiberoptic laryngoscopy by ENT on 3/9 but was aborted due to gagging and vomiting. ENT will re-attempt in a couple of days.   3/11- could hear a few words using PMV this AM- will con't to monitor  Pt ok with ENT re attempting laryngoscopy  17. Leukocytosis-resolved  17. Disposition: Patient lives with wife and 5 children aged 24-23 who will provide support upon discharge.    LOS: 6 days A FACE TO FACE EVALUATION WAS PERFORMED  Erick Colace 03/29/2019, 8:59 AM

## 2019-03-29 NOTE — Progress Notes (Signed)
SLP Cancellation Note  Patient Details Name: Andrew Bruce MRN: [id number] DOB: [birth year]   Cancelled treatment:        Attempted to see pt during scheduled session, however he was unavailable due to to wound treatment being completed with pt in his room. Pt missed 30 mins skilled ST as a result.                                                                                                 Little Ishikawa 03/29/2019, 12:31 PM

## 2019-03-30 ENCOUNTER — Inpatient Hospital Stay (HOSPITAL_COMMUNITY): Payer: Self-pay | Admitting: Occupational Therapy

## 2019-03-30 ENCOUNTER — Inpatient Hospital Stay (HOSPITAL_COMMUNITY): Payer: Self-pay

## 2019-03-30 ENCOUNTER — Inpatient Hospital Stay (HOSPITAL_COMMUNITY): Payer: Self-pay | Admitting: Speech Pathology

## 2019-03-30 LAB — GLUCOSE, CAPILLARY
Glucose-Capillary: 136 mg/dL — ABNORMAL HIGH (ref 70–99)
Glucose-Capillary: 102 mg/dL — ABNORMAL HIGH (ref 70–99)
Glucose-Capillary: 209 mg/dL — ABNORMAL HIGH (ref 70–99)
Glucose-Capillary: 150 mg/dL — ABNORMAL HIGH (ref 70–99)

## 2019-03-30 MED ORDER — INSULIN DETEMIR 100 UNIT/ML ~~LOC~~ SOLN
27.0000 [IU] | SUBCUTANEOUS | Status: AC
  Administered 2019-03-30: 27 [IU] via SUBCUTANEOUS
  Filled 2019-03-30: qty 0.27

## 2019-03-30 MED ORDER — INSULIN DETEMIR 100 UNIT/ML ~~LOC~~ SOLN
27.0000 [IU] | Freq: Two times a day (BID) | SUBCUTANEOUS | Status: DC
  Administered 2019-03-30 – 2019-04-02 (×6): 27 [IU] via SUBCUTANEOUS
  Filled 2019-03-30 (×9): qty 0.27

## 2019-03-30 NOTE — Patient Care Conference (Signed)
Inpatient RehabilitationTeam Conference and Plan of Care Update Date: 03/30/2019   Time: 11:45 AM    Patient Name: Andrew Bruce      Medical Record Number: [id number]  Date of Birth: 1965-03-14 Sex: Male         Room/Bed: 4M13C/4M13C-01 Payor Info: Payor: /    Admit Date/Time:  03/23/2019  7:58 PM  Primary Diagnosis:  Embolic stroke [facility])  Patient Active Problem List   Diagnosis Date Noted  . PEG (percutaneous endoscopic gastrostomy) status (HCC) 03/24/2019  . Leukocytosis   . 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 Status post tracheostomy (HCC)   . Pressure injury of skin 03/02/2019  . On mechanically assisted ventilation (HCC)   . Goals of care, counseling/discussion   . Palliative care encounter   . ARDS (adult respiratory distress syndrome) (HCC)   . Cerebral embolism with cerebral infarction 02/17/2019  . Acute respiratory failure with hypoxia (HCC)   . Pneumothorax, right   . COVID-19 02/06/2019    Expected Discharge Date: Expected Discharge Date: (Estimated LOS 4 weeks.)  Team Members Present: Physician leading conference: Dr. Genice Rouge Care Coodinator Present: Cecile Sheerer, LCSWA;Genie Takuya Lariccia, RN, MSN Nurse Present: Willey Blade, RN PT Present: Serina Cowper, PT OT Present: Towanda Malkin, OT SLP Present: Suzzette Righter, CF-SLP PPS Coordinator present : Edson Snowball, Park Breed, SLP     Current Status/Progress Goal Weekly Team Focus  Bowel/Bladder   continent of urine with periods of incontinence, mostly incontinent of bowel, LBM-3/15  Regain regular urinary and bowel pattern  Assist with toileting, assess for bowel and urinary needs, address as needed   Swallow/Nutrition/ Hydration   Mod-Severe dysphagia, FEES revealed partially closed airway, vocal fold and arytenoid damage, high aspiration risk, NPO  Min A dysphagia therapy - EMST, IMST, repeat instrumental imaging  EMST, IMST, readiness for repeat FEES or MBSS   ADL's   UB dressing mod A, LB  dressing min A (bed level), functional transfers min A  CS  adl training, balance and NMRE, transfer training, endurance activities   Mobility   Min A bed mobility, mod-min A transfers with and without a RW, min A squat pivot transfers, gait 8 ft with B HHA  Min A overall, gait 100 ft, will upgrade to CGA-supervision for increased independence  Atcivity tolerance, sitting tolerance, standing tolerance, functional mobility, strengthening, respiratory exercises, R side NMR, patient/caregiver education   Communication   Aphonic, unable to use PMV due to anatomical limitations, no adduction of vocal folds  Min A  Education about PMV and voicing, continue attempts to voice with PMV   Safety/Cognition/ Behavioral Observations            Pain   PRN oxycodone and IV fentanyl for pain to sacrum due to pressure wound  Pain level <3/10  Assess pain every shift and as needed, provide intervention as needed   Skin   unstageable pressure ulcer to sacrum, toes on bilateral feet black and purple, peeling skin noted  promote healing, no further impairment  Assess skin, provide wound care as ordered, provide intervention as indicated, keep pt turned and repositioned    Rehab Goals Patient on target to meet rehab goals: Yes *See Care Plan and progress notes for long and short-term goals.     Barriers to Discharge  Current Status/Progress Possible Resolutions Date Resolved   Nursing                  PT  New oxygen;Medical stability;Home environment access/layout;Weight bearing restrictions  Patient has 2 STE his home, is currently on track collar with new oxygen support, has intermittent orthostatic hypotension and dizziness with mobility, gangreneous toes bilaterally limited forefoot weight bearing  wrapping LEs with ACE wraps to control OH, wearing socks for standing and gait while encouraging heel weaight bearing           OT                  SLP                SW                Discharge  Planning/Teaching Needs:  D/c to home with support from his wife, children, and various family members  Family education as recommended by therapy   Team Discussion: MD tachycardia, lungs better, abd pain, coughing, sacral decubitus less deep, gangrene on feet better, BS labile, on O2, is undocumented.  RN BM yest, cont, BS 136, suctioned X 1.  OT using interpreter, LB D S/min in bed, UB D min A, has trach, set up oral care, bed min A, transfers min A, R arm weakness.  PT bed S, min A transfers, amb 10' X 2 min a, O2 sats 92, HR 99-114.  SLP NPO, unable to voice with PMV, ENT to see him, is coughing with PMV on, thick secretions.  SW wife stays home, has 5 children, has other family involved.   Revisions to Treatment Plan: N/A     Medical Summary Current Status: continent B/B- LBM yesterday- BG 136 this AM; Weekly Focus/Goal: OT- able to LB dressing bed level min assist; min assist UB; oral care; all min assist- R arm weak  Barriers to Discharge: Decreased family/caregiver support;Insurance for SNF coverage;Home enviroment access/layout;Medical stability;Trach;Weight;Wound care;Other (comments);New diabetic;Nutrition means  Barriers to Discharge Comments: hydrotherapy; gangrenous toes; NPO- working with ENT' SLP- pharyngeal strengthening; can't voice with PMV- anatomical limitations- Possible Resolutions to Barriers: PT- good progress- Bed SBA; transfers min assist; walking- shoes from wife didn't work; iused nonskid socks 38ft x2- min assist HHA no RW;  92% O2 at lowest-   Continued Need for Acute Rehabilitation Level of Care: The patient requires daily medical management by a physician with specialized training in physical medicine and rehabilitation for the following reasons: Direction of a multidisciplinary physical rehabilitation program to maximize functional independence : Yes Medical management of patient stability for increased activity during participation in an intensive rehabilitation  regime.: Yes Analysis of laboratory values and/or radiology reports with any subsequent need for medication adjustment and/or medical intervention. : Yes   I attest that I was present, lead the team conference, and concur with the assessment and plan of the team.   Retta Diones 03/30/2019, 4:17 PM   Team conference was held via web/ teleconference due to [place] - 19

## 2019-03-30 NOTE — Plan of Care (Signed)
  Problem: RH Bed to Chair Transfers Goal: LTG Patient will perform bed/chair transfers w/assist (PT) Description: LTG: Patient will perform bed to chair transfers with assistance (PT). Flowsheets (Taken 03/30/2019 1151) LTG: Pt will perform Bed to Chair Transfers with assistance level: (upgrade goal) Supervision/Verbal cueing Note: Upgraded goal due to increased strength and balance with functional mobility.   Problem: RH Car Transfers Goal: LTG Patient will perform car transfers with assist (PT) Description: LTG: Patient will perform car transfers with assistance (PT). Flowsheets (Taken 03/30/2019 1151) LTG: Pt will perform car transfers with assist:: (Upgraded goal) Contact Guard/Touching assist Note: Upgraded goal due to increased strength and balance with functional mobility.   Problem: RH Furniture Transfers Goal: LTG Patient will perform furniture transfers w/assist (OT/PT) Description: LTG: Patient will perform furniture transfers  with assistance (OT/PT). Flowsheets (Taken 03/30/2019 1151) LTG: Pt will perform furniture transfers with assist:: (Upgraded goal) Contact Guard/Touching assist Note: Upgraded goal due to increased strength and balance with functional mobility.   Problem: RH Ambulation Goal: LTG Patient will ambulate in controlled environment (PT) Description: LTG: Patient will ambulate in a controlled environment, # of feet with assistance (PT). Flowsheets Taken 03/30/2019 1151 LTG: Pt will ambulate in controlled environ  assist needed:: (Upgraded goal) Contact Guard/Touching assist Taken 03/26/2019 1201 LTG: Ambulation distance in controlled environment: 100 ft using LRAD Goal: LTG Patient will ambulate in home environment (PT) Description: LTG: Patient will ambulate in home environment, # of feet with assistance (PT). Flowsheets Taken 03/30/2019 1151 LTG: Pt will ambulate in home environ  assist needed:: (Upgrade goals) Supervision/Verbal cueing Taken 03/26/2019  1201 LTG: Ambulation distance in home environment: 50 ft using LRAD Note: Upgraded goal due to increased strength and balance with functional mobility.

## 2019-03-30 NOTE — Progress Notes (Addendum)
[place] PHYSICAL MEDICINE & REHABILITATION PROGRESS NOTE   Subjective/Complaints:   Abd hurts from coughing- willing to work now with ENT for possible improvement in swallowing.     ROS:   Pt denies SOB, abd pain, CP, N/V/C/D, and vision changes       Objective:   No results found. Recent Labs    03/29/19 0524  WBC 8.3  HGB 11.4*  HCT 37.6*  PLT 535*   Recent Labs    03/29/19 0524  NA 138  K 4.6  CL 97*  CO2 28  GLUCOSE 88  BUN 18  CREATININE 0.42*  CALCIUM 9.0    Intake/Output Summary (Last 24 hours) at 03/30/2019 0921 Last data filed at 03/30/2019 0734 Gross per 24 hour  Intake 0 ml  Output 1325 ml  Net -1325 ml     Physical Exam: Vital Signs Blood pressure 124/85, pulse (!) 107, temperature 98.6 F (37 C), temperature source Oral, resp. rate 14, height 5\' 6"  (1.676 m), weight 74.4 kg, SpO2 98 %.  Nursing note and vitals reviewed.  General: pt laying supine in bed; more comfortable, NAD CV: tachycardic, regular rhythm Pulm: improved aeration B/L- slightly coarse, but great air movement,  GI: soft, TTP over epigastric/muscles from coughing, ND, (+)BS- (+)PEG Musculoskeletal:        General: No edema.     Comments:  Dry gangrene bilateral 1st and 2nd toes> 3rd toes  Neurological: Ox3  Skin: He is not diaphoretic.Pic 3/13    Slough notable but granulating at edges Psych:brighter   Assessment/Plan: 1. 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  Physiatrist is providing close team supervision and 24 hour management of active medical problems listed below.  Physiatrist and rehab team continue to assess barriers to discharge/monitor patient progress toward functional and medical goals  Care Tool:  Bathing    Body parts bathed by patient: Right arm, Left arm, Chest, Abdomen, Face   Body  parts bathed by helper: Front perineal area, Buttocks, Right upper leg, Left upper leg, Right lower leg, Left lower leg     Bathing assist Assist Level: Moderate Assistance - Patient 50 - 74%     Upper Body Dressing/Undressing Upper body dressing   What is the patient wearing?: Pull over shirt    Upper body assist Assist Level: Minimal Assistance - Patient > 75%    Lower Body Dressing/Undressing Lower body dressing      What is the patient wearing?: Pants     Lower body assist Assist for lower body dressing: Maximal Assistance - Patient 25 - 49%     Toileting Toileting Toileting Activity did not occur (Clothing management and hygiene only): N/A (no void or bm)(foley)  Toileting assist Assist for toileting: Dependent - Patient 0%     Transfers Chair/bed transfer  Transfers assist  Chair/bed transfer activity did not occur: Safety/medical concerns(limited by dizziness, chest pain)  Chair/bed transfer assist level: Minimal Assistance - Patient > 75%     Locomotion Ambulation   Ambulation assist   Ambulation activity did not occur: Safety/medical concerns(limited by dizziness, chest pain)  Assist level: Minimal Assistance - Patient > 75% Assistive device: Other (comment)(B UE support on PT's shoulders) Max distance: 8'   Walk 10 feet activity   Assist  Walk 10 feet activity did not occur: Safety/medical concerns(limited by dizziness, chest pain)  Walk 50 feet activity   Assist Walk 50 feet with 2 turns activity did not occur: Safety/medical concerns(limited by dizziness, chest pain)         Walk 150 feet activity   Assist Walk 150 feet activity did not occur: Safety/medical concerns(limited by dizziness, chest pain)         Walk 10 feet on uneven surface  activity   Assist Walk 10 feet on uneven surfaces activity did not occur: Safety/medical concerns(limited by dizziness, chest pain)         Wheelchair     Assist       Wheelchair activity did not occur: Safety/medical concerns(limited by dizziness, chest pain)         Wheelchair 50 feet with 2 turns activity    Assist    Wheelchair 50 feet with 2 turns activity did not occur: Safety/medical concerns(limited by dizziness, chest pain)       Wheelchair 150 feet activity     Assist  Wheelchair 150 feet activity did not occur: Safety/medical concerns(limited by dizziness, chest pain)       Blood pressure 124/85, pulse (!) 107, temperature 98.6 F (37 C), temperature source Oral, resp. rate 14, height 5\' 6"  (1.676 m), weight 74.4 kg, SpO2 98 %.  1.  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              -patient may shower             -ELOS/Goals: 2-3 weeks minA , Cont CIR  PT, OT, SLP 2.  Antithrombotics: -DVT/anticoagulation:  Pharmaceutical: Other (comment)--Eliquis             -antiplatelet therapy: N/A 3. Pain Management: Fentanyl IV prior to hydrotherapy. Will add oxycodone prn for sacral pain.   3/13- pain doing better- con't regimen for now.   3/14- R shoulder pain is doing better  4. Mood: LCSW to follow for evaluation and support.              -antipsychotic agents: N/A 5. Neuropsych: This patient appears to be capable of making decisions on his own behalf. 6. Sacral decubitus/Skin/Wound Care- Was unstageable- initially, covered with eshar initially- looks deep Stage III currently. : continue air mattress overlay.  Hydrotherapy 6 days a week. Tube feeds with additional protein supplement and vitamins to help promote wound healing.   3/13- have wet to dry on days not getting hydrotherapy. 7. Fluids/Electrolytes/Nutrition: Monitor I/O. Check lytes in am and weekly.  8. T2DM: Hgb A1c- 11.7--continue Levemir bid with SSI every 4 hours for elevated BS. Will consult dietician to start bolus tube feeds.    CBG (last 3)  Recent Labs    03/29/19 1648 03/29/19 2123 03/30/19 0607   GLUCAP 209* 235* 136*   CBGs with some lability    3/14- increase Levemir to 27 units BID - am CBG on low side will need snack at noc  3/16- BGs variable/labile- con't meds for now 9. Dizziness: Will change Cardura to bedtime to avoid SE. Will monitor orthostatic Bps  3/14- BP on soft side and slightly tachycardic, likely due to breahting meds and overall deconditioning- con't to monitor 10. 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   3/11- will add Duonebs QID- not prn-   3/12- improved SOB- con't regimen- discussed with resp therapy.  3/14- con't Duonebs-TID .   12. Resting tachycardia: Due to deconditioning. No BB due  to soft BP/orthostatic symptoms.  Monitor for now.    Vitals:   03/30/19 0356 03/30/19 0818  BP:    Pulse: 100 (!) 107  Resp: 16 14  Temp:    SpO2: 97% 98%  mildly elevated likely due to deconditioning   3/16- pulse still in 100s- due to deconditioning- con't meds 13. Gangrenous changes bilateral feet: Will order off loading shoes. Continue to monitor.   Primarily affecting 1st and 2nd toes bilaterally - pedal and post tib pulses normal , likely small vessel infarcts   3/16- toes doing better- peels dry skin and looks like healing somewhat.  14. Abnormal LFTs: Recheck labs in am.   3/10- mild elevation of ALT only- will con't to monitor 15. ABLA: Due to critical illness. Will check anemia panel.   3/10- looking better- con't treatment 16. Right Vocal fold immobility, dysphonia:s/p transnasal fiberoptic laryngoscopy by ENT on 3/9 but was aborted due to gagging and vomiting. ENT will re-attempt in a couple of days.   3/11- could hear a few words using PMV this AM- will con't to monitor  Pt ok with ENT re attempting laryngoscopy   3/16- will call ENT to get scope done for pt.  17. Leukocytosis-resolved  17. Disposition: Patient lives with wife and 5 children aged 56-23 who will provide support upon discharge.    LOS: 7 days A  FACE TO FACE EVALUATION WAS PERFORMED  Andrew Bruce 03/30/2019, 9:21 AM

## 2019-03-30 NOTE — Progress Notes (Signed)
Speech Language Pathology Daily Session Note  Patient Details  Name: Andrew Bruce MRN: [id number] Date of Birth: September 29, 1965  Today's Date: 03/30/2019 SLP Individual Time: 1100-1130 SLP Individual Time Calculation (min): 30 min  Short Term Goals: Week 1: SLP Short Term Goal 1 (Week 1): Pt will complete oropharyngeal strengthening exercises to maximize swallow function and safety for return to safe PO intake SLP Short Term Goal 2 (Week 1): Pt will complete 5 repetitions with EMST 150 device set at 30 cmH20 and IMST device set at 10 cmH20  with mod A verbal instruction. SLP Short Term Goal 3 (Week 1): Pt will consume ice chips at bedside to rehabilitate musculature and to increase ROM. SLP Short Term Goal 3 - Progress (Week 1): Revised due to lack of progress SLP Short Term Goal 4 (Week 1): Pt will communicate basic wants and needs with Mod A verbal cues SLP Short Term Goal 5 (Week 1): Pt will demonstrate orientation x4 with mod A multimodal cues SLP Short Term Goal 5 - Progress (Week 1): Met SLP Short Term Goal 6 (Week 1): Pt will demonstrate donning and doffing of PMSV with mod A multimodal cues  Skilled Therapeutic Interventions:  Skilled treatment session focused on education surrounding trach and PMSV with teaching pt how to donn and doff PMSV. Mirror and tactile cues needed but pt continues to struggle with placing it himself. Will continue to provide demonstration teaching. SLP also facilitated session by providing skilled observation of pt consuming ice chips. Pt continues to present with timely oral and pharyngeal phase and no overt s/s of aspiration. Pt left upright in bed, bed alarm on and all needs within reach.      Pain Pain Assessment Pain Scale: 0-10 Pain Score: Asleep Pain Type: Acute pain Pain Location: Sacrum Pain Descriptors / Indicators: Aching Pain Frequency: Intermittent Pain Onset: On-going Pain Intervention(s): Medication (See  eMAR);Repositioned  Therapy/Group: Individual Therapy  Terez Montee 03/30/2019, 4:21 PM

## 2019-03-30 NOTE — Progress Notes (Addendum)
Physical Therapy Session Note  Patient Details  Name: Andrew Bruce MRN: [id number] Date of Birth: [birth year]  Today's Date: 03/30/2019 PT Individual Time: 1300-1403 PT Individual Time Calculation (min): 63 min   Short Term Goals: Week 1:  PT Short Term Goal 1 (Week 1): Patient will perform bed mobility with supervision with HOB elevated to 30 degrees. PT Short Term Goal 2 (Week 1): Patient will perofrm basic transfers with max assist. PT Short Term Goal 3 (Week 1): Patient will tolerated sitting in w/c or recliner >1 hour. PT Short Term Goal 4 (Week 1): Patient will initiate standing activities using LRAD with max A.  Skilled Therapeutic Interventions/Progress Updates:     Patient in bed on 28% FiO2 via trach collar with PMSV in place upon PT arrival. Interpreter present for full session. Patient alert and agreeable to PT session. Patient reported 9/10 sacral pain during session, RN made aware. PT provided repositioning, rest breaks, and distraction as pain interventions throughout session. Patient transferred to portable O2 tank for mobility. Per RN, placed patient on 2L/min O2 via trach collar on portable oxygen.   Therapeutic Activity: Bed Mobility: Patient performed supine to/from sit with supervision. Provided verbal cues for rolling to R side then pushing to sit up. Transfers: Patient performed sit to/from stand x2 in the // bars and x1 with the RW with CGA and stand pivot with and without the RW with min A. Provided verbal cues for hand placement, forward weight shift, and heel weigh bearing. He performed standing balance with the RW ~2 min progressing from CGA-supervision as PT adjusted patient's brief and pants.   Gait Training:  Patient ambulated 10 feet forwards and backwards x3 in the // bars with CGA. Ambulated with very small step, decreased step height, and narrow BOS. Provided verbal cues for increased step height and heel weight bearing. Patient increased speed with each  trial. Provided seated rest breaks between trials and cues for diaphragmatic breathing and activation with coughing, when elicited, to promote secretion clearance.  Wheelchair Mobility:  Patient was transported in the w/c with total A throughout session for energy conservation and time management.  Patient in bed returned to wall unit O2 at 28% FiO2 reporting significant fatigue and sacral pain with RN in room at end of session with breaks locked, bed alarm set, and all needs within reach. Patient missed 12 min of skilled PT due to fatigue and pain.  Vitals: SPO2 >92% and HR between 101-116 with activity via continuous pulse-ox.  Therapy Documentation Precautions:  Precautions Precautions: Fall, Other (comment) Precaution Comments: PMSV at all times, PEG;  NPO Restrictions Weight Bearing Restrictions: No Other Position/Activity Restrictions: HOB elevated 30 degrees at all times General: PT Amount of Missed Time (min): 12 Minutes PT Missed Treatment Reason: Patient fatigue    Therapy/Group: Individual Therapy  [place] L Justn Quale PT, DPT  03/30/2019, 5:17 PM

## 2019-03-30 NOTE — Progress Notes (Signed)
Physical Therapy Wound Treatment Patient Details  Name: Andrew Bruce MRN: [id number] Date of Birth: Mar 20, 1965  Today's Date: 03/30/2019 PT Individual Time: 1005-1033 PT Individual Time Calculation (min): 28 min   Subjective  Subjective: Pt supine in bed on arrival, agreeable to session.   Patient and Family Stated Goals: None stated Prior Treatments: treatment on acute  Pain Score:  premedicated  4/10  Wound Assessment  Pressure Injury 02/22/19 Coccyx Medial Unstageable - Full thickness tissue loss in which the base of the injury is covered by slough (yellow, tan, gray, green or brown) and/or eschar (tan, brown or black) in the wound bed. quarter-sized/purple  (Active)  Dressing Type Foam - Lift dressing to assess site every shift;Barrier Film (skin prep);Moist to dry 03/30/19 1045  Dressing Changed 03/30/19 1045  Dressing Change Frequency Daily 03/30/19 1045  State of Healing Other (Comment) 03/30/19 1045  Site / Wound Assessment Yellow;Pink 03/30/19 1045  % Wound base Red or Granulating 40% 03/30/19 1045  % Wound base Yellow/Fibrinous Exudate 60% 03/30/19 1045  % Wound base Black/Eschar 0% 03/30/19 1045  % Wound base Other/Granulation Tissue (Comment) 0% 03/30/19 1045  Peri-wound Assessment Intact 03/30/19 1045  Wound Length (cm) 5 cm 03/24/19 1531  Wound Width (cm) 2 cm 03/24/19 1531  Wound Depth (cm) 0.7 cm 03/24/19 1531  Wound Surface Area (cm^2) 10 cm^2 03/24/19 1531  Wound Volume (cm^3) 7 cm^3 03/24/19 1531  Tunneling (cm) 0 03/23/19 2122  Undermining (cm) 0 03/23/19 2122  Margins Unattached edges (unapproximated) 03/30/19 1045  Drainage Amount Minimal 03/30/19 1045  Drainage Description Sanguineous;No odor;Serous;Serosanguineous 03/30/19 1045  Treatment Cleansed;Debridement (Selective);Hydrotherapy (Pulse lavage);Packing (Saline gauze);Other (Comment) 03/30/19 1045   Santyl applied to wound bed prior to applying dressing.    Hydrotherapy Pulsed lavage therapy -  wound location: Sacrum Pulsed Lavage with Suction (psi): 12 psi(4-12) Pulsed Lavage with Suction - Normal Saline Used: 1000 mL Pulsed Lavage Tip: Tip with splash shield Selective Debridement Selective Debridement - Location: Sacrum Selective Debridement - Tools Used: Forceps;Scalpel Selective Debridement - Tissue Removed: Thick, tenacious, yellow eschar   Wound Assessment and Plan  Wound Therapy - Assess/Plan/Recommendations Wound Therapy - Clinical Statement: Wound continues to present with adherent yellow eschar and partial areas of pink granulation.  Combination of Santyl and selective debridement is slowly improving the wound.   Pt continues to benefit from hydrotherapy for decreasing bio burden and for selective debridement to remove adherent eshar and promote granulation.  Goal is to continue to decrease necrosis and bioburden in wound bed.   Wound Therapy - Functional Problem List: Global weakness s/p COVID, CVA's, aortic clot Factors Delaying/Impairing Wound Healing: Multiple medical problems;Immobility Hydrotherapy Plan: Debridement;Dressing change;Patient/family education;Pulsatile lavage with suction Wound Therapy - Frequency: 6X / week Wound Therapy - Follow Up Recommendations: Home health RN Wound Plan: See above  Wound Therapy Goals- Improve the function of patient's integumentary system by progressing the wound(s) through the phases of wound healing (inflammation - proliferation - remodeling) by: Decrease Necrotic Tissue to: 10 Decrease Necrotic Tissue - Progress: Progressing toward goal Increase Granulation Tissue to: 90 Increase Granulation Tissue - Progress: Progressing toward goal Goals/treatment plan/discharge plan were made with and agreed upon by patient/family: Yes Time For Goal Achievement: 7 days Wound Therapy - Potential for Goals: Good  Goals will be updated until maximal potential achieved or discharge criteria met.  Discharge criteria: when goals achieved,  discharge from hospital, MD decision/surgical intervention, no progress towards goals, refusal/missing three consecutive treatments without notification or medical reason.  GP     [name] Alejandria Wessells 03/30/2019, 10:49 AM  03/30/2019  [name], PT Acute Rehabilitation Services [phone]  (pager) [phone]  (office)

## 2019-03-30 NOTE — Progress Notes (Signed)
Occupational Therapy Session Note  Patient Details  Name: Andrew Bruce MRN: [id number] Date of Birth: 01-26-1965  Today's Date: 03/30/2019 OT Individual Time: 1130-1200 OT Individual Time Calculation (min): 30 min    Short Term Goals: Week 1:  OT Short Term Goal 1 (Week 1): Pt will tolerate out of bed for 2-3 hrs a day OT Short Term Goal 2 (Week 1): Pt will perform UB dressing wth mod A OT Short Term Goal 3 (Week 1): Pt will dress LB clothing (brief/ pants) with max A OT Short Term Goal 4 (Week 1): Pt will transfer to toilet with max A +1  Skilled Therapeutic Interventions/Progress Updates:    1:1 Pt in bed when arrived and required min A to come to EOB (A for trunk). Pt able transfer to the w/c with min A and taken in the bathroom. Pt able to perform stand step transfer with min A to the toilet. PT able to perform 3/3  toileting tasks with setup and min A for standing balance. Once transferred back to the w/c pt able to tie pants with extra time and encouragement. After seated rest break pt stood at the sink to wash hands with min A for balance. Pt preferred to remain in the w/c tilted back at of session to increase tolerance  to being out of bed. Pt O2 sats remained above 94% on trach collar. Interpreter present for session,   Therapy Documentation Precautions:  Precautions Precautions: Fall, Other (comment) Precaution Comments: PMSV at all times, PEG;  NPO Restrictions Weight Bearing Restrictions: No Other Position/Activity Restrictions: HOB elevated 30 degrees at all times General:   Vital Signs: Therapy Vitals Temp: 98.6 F (37 C) Temp Source: Oral Pulse Rate: 95 Resp: 19 BP: 126/88 Patient Position (if appropriate): Lying Oxygen Therapy SpO2: 97 % O2 Device: Tracheostomy Collar O2 Flow Rate (L/min): 5 L/min FiO2 (%): 28 % Pain:  no c/o pain in session   Therapy/Group: Individual Therapy  Roney Mans [facility] 03/30/2019, 1:52 PM

## 2019-03-30 NOTE — Progress Notes (Signed)
Occupational Therapy Session Note  Patient Details  Name: Andrew Bruce MRN: [id number] Date of Birth: 1965-06-22  Today's Date: 03/30/2019 OT Individual Time: 0900-1000 OT Individual Time Calculation (min): 60 min    Short Term Goals: Week 1:  OT Short Term Goal 1 (Week 1): Pt will tolerate out of bed for 2-3 hrs a day OT Short Term Goal 2 (Week 1): Pt will perform UB dressing wth mod A OT Short Term Goal 3 (Week 1): Pt will dress LB clothing (brief/ pants) with max A OT Short Term Goal 4 (Week 1): Pt will transfer to toilet with max A +1  Skilled Therapeutic Interventions/Progress Updates:    Patient in bed, alert and denies pain (note pain at sacrum at times during session - relieved by repositioning) O2 via trach collar, interpreter present for session.  Completed LB bathing and dressing bed level this session with min a to change incontinence brief, CS for pants, max A for slipper socks.  Supine to sitting with min A.  Unsupported sitting with CS.  Sit to stand and SPT to TIS w/c with min A.  Completed UB bathing and dressing w/c level with min A.  Oral care with suction toothbrush set up/CS.  Assisted with shaving dependent.  Completed UB AROM exercises with focus on right side ROM and symmetry - improved with focused activity (he tends to drop items with self care).  Returned to bed at close of session with min A.  Call bell in reach.    Therapy Documentation Precautions:  Precautions Precautions: Fall, Other (comment) Precaution Comments: PMSV at all times, PEG;  NPO Restrictions Weight Bearing Restrictions: No Other Position/Activity Restrictions: HOB elevated 30 degrees at all times General:   Vital Signs: Therapy Vitals Pulse Rate: 100 Resp: 16 Patient Position (if appropriate): Lying Oxygen Therapy SpO2: 97 % O2 Device: Tracheostomy Collar O2 Flow Rate (L/min): 5 L/min FiO2 (%): 28 %   Therapy/Group: Individual Therapy  [name] 03/30/2019, 7:40 AM

## 2019-03-31 ENCOUNTER — Encounter (HOSPITAL_COMMUNITY): Payer: Self-pay | Admitting: Psychology

## 2019-03-31 ENCOUNTER — Inpatient Hospital Stay (HOSPITAL_COMMUNITY): Payer: Self-pay

## 2019-03-31 ENCOUNTER — Inpatient Hospital Stay (HOSPITAL_COMMUNITY): Payer: Self-pay | Admitting: Speech Pathology

## 2019-03-31 LAB — GLUCOSE, CAPILLARY
Glucose-Capillary: 80 mg/dL (ref 70–99)
Glucose-Capillary: 263 mg/dL — ABNORMAL HIGH (ref 70–99)
Glucose-Capillary: 99 mg/dL (ref 70–99)
Glucose-Capillary: 218 mg/dL — ABNORMAL HIGH (ref 70–99)
Glucose-Capillary: 126 mg/dL — ABNORMAL HIGH (ref 70–99)

## 2019-03-31 MED ORDER — CHLORHEXIDINE GLUCONATE CLOTH 2 % EX PADS
6.0000 | MEDICATED_PAD | Freq: Two times a day (BID) | CUTANEOUS | Status: DC
  Administered 2019-03-31 – 2019-04-10 (×14): 6 via TOPICAL

## 2019-03-31 MED ORDER — OSMOLITE 1.5 CAL PO LIQD
355.0000 mL | Freq: Four times a day (QID) | ORAL | Status: DC
  Administered 2019-03-31 – 2019-04-01 (×3): 355 mL
  Administered 2019-04-02: 286 mL
  Administered 2019-04-02: 236 mL
  Filled 2019-03-31 (×9): qty 474

## 2019-03-31 MED ORDER — INSULIN ASPART 100 UNIT/ML ~~LOC~~ SOLN
3.0000 [IU] | Freq: Every day | SUBCUTANEOUS | Status: DC
  Administered 2019-03-31 – 2019-04-04 (×5): 3 [IU] via SUBCUTANEOUS

## 2019-03-31 NOTE — Progress Notes (Signed)
Physical Therapy Weekly Progress Note  Patient Details  Name: Andrew Bruce MRN: [id number] Date of Birth: [birth year]  Beginning of progress report period: March 24, 2019 End of progress report period: March 31, 2019  Today's Date: 03/31/2019 PT Individual Time: 1105-1205 PT Individual Time Calculation (min): 60 min   Patient has met 2 of 4 short term goals.  Patient is making good progress with PT, however is limited by sacral pain and decreased activity tolerance intermittently during sessions. He currently requires supervision for bed mobility with cues, min A transfers with and without a RW, min A-CGA gait 10 feet with RW or parallel bars, and CGA-supervision for static standing balance. He needs frequent cues to maintain heel weight bearing in standing to protect B gangrenous toes, continuing to determine appropriate footwear to protect toes and allow patient to progress with ambulation effectively. He continues to require frequent rest breaks with minimal activity due to deconditioning.   Patient continues to demonstrate the following deficits muscle weakness and muscle joint tightness, decreased cardiorespiratoy endurance and decreased oxygen support, impaired timing and sequencing, abnormal tone, unbalanced muscle activation and decreased coordination and decreased sitting balance, decreased standing balance, decreased postural control, decreased balance strategies and difficulty maintaining precautions and therefore will continue to benefit from skilled PT intervention to increase functional independence with mobility.  Patient progressing toward long term goals..  Plan of care revisions: Upgraded LTG's due to patient's progress this week. Please see Care Plan..  PT Short Term Goals Week 1:  PT Short Term Goal 1 (Week 1): Patient will perform bed mobility with supervision with HOB elevated to 30 degrees. PT Short Term Goal 1 - Progress (Week 1): Progressing toward goal PT Short Term  Goal 2 (Week 1): Patient will perofrm basic transfers with max assist. PT Short Term Goal 2 - Progress (Week 1): Met PT Short Term Goal 3 (Week 1): Patient will tolerated sitting in w/c or recliner >1 hour. PT Short Term Goal 3 - Progress (Week 1): Progressing toward goal PT Short Term Goal 4 (Week 1): Patient will initiate standing activities using LRAD with max A. PT Short Term Goal 4 - Progress (Week 1): Met Week 2:  PT Short Term Goal 1 (Week 2): Patient will consistently perform all bed mobility with supervision without use of bed rails. PT Short Term Goal 2 (Week 2): Patient will perform basic transfers with close supervision using LRAD. PT Short Term Goal 3 (Week 2): Patient will ambulate >25 ft using LRAD. PT Short Term Goal 4 (Week 2): Patient will tolerate sitting OOB >1 hour.  Skilled Therapeutic Interventions/Progress Updates:     Patient in bed on FiO2 of 28% via trach collar with interpreter in the room speaking with his wife on the phone upon PT arrival. Patient alert and agreeable to PT session, reported increased fatigue and feeling cold today. Patient denied pain during session. Interpreter asked patient's wife to bring him more clothes to wear and PT provided his wife with an update on patient's progress. She also asked about when the trach would be removed, differed the question to the medical team at this time.  Patient remained on wall unit at 28% FiO2 via trach collar throughout session.  Therapeutic Activity: Bed Mobility: Patient performed supine to/from sit with HOB elevated ~30 degrees with min A-CGA for light trunk assist to sit up. Provided verbal cues for positioning and being aware of where his feet are to protect his toes. Patient donned a paper scrub shirt and  threaded LEs through paper scrub pants sitting EOB with min A.  Transfers: Patient performed stand sit to/from stand x1 and stand pivot bed<>BSC with CGA using the RW. Provided verbal cues for hand placement,  staying in the RW, and sequencing. Patient was continent of bladder on [facility], required supervision for peri-care and min A for pants management with CGA for balance. Patient reported mild dizziness after each stand and utilized VOR stabilization, looking a end of his finger, as a strategy to resolve dizziness learned in a previous session. Patient with orthostatic vitals in standing, see below, and PT applied 1-4" ACE wrap to B LEs.   Vitals in supine: BP 113/82, HR 104, Temp 98.3, SPO2 98%. Vitals in sitting: BP 112/77, HR 118, SPO2 between 92-95% on continuous pulse ox. Vitals in standing: BP 81/69 (reports mild dizziness), HR 122, SPO2 92-93% on continuous pulse ox.   Patient in bed lying on his R side at end of session with breaks locked, bed alarm set, and all needs within reach. Patient required increased time with all mobility today due to increased fatigue, RN made aware.   Therapy Documentation Precautions:  Precautions Precautions: Fall, Other (comment) Precaution Comments: PMSV at all times, PEG;  NPO Restrictions Weight Bearing Restrictions: No Other Position/Activity Restrictions: HOB elevated 30 degrees at all times   Therapy/Group: Individual Therapy  Tiffine Henigan L Angela Platner PT, DPT  03/31/2019, 4:09 PM

## 2019-03-31 NOTE — Progress Notes (Signed)
[facility] PHYSICAL MEDICINE & REHABILITATION PROGRESS NOTE   Subjective/Complaints:   Pt reports no issues.   Nursing/OT reports they aren't sure how to wean O2  Since he's on trach collar- cannot go to [place] O2- and needs to push O2 to trach with trach collar- so we discussed calling head of resp therapy to see how to wean O2.   ROS:    Pt denies SOB, abd pain, CP, N/V/C/D, and vision changes     Objective:   No results found. Recent Labs    03/29/19 0524  WBC 8.3  HGB 11.4*  HCT 37.6*  PLT 535*   Recent Labs    03/29/19 0524  NA 138  K 4.6  CL 97*  CO2 28  GLUCOSE 88  BUN 18  CREATININE 0.42*  CALCIUM 9.0    Intake/Output Summary (Last 24 hours) at 03/31/2019 1106 Last data filed at 03/31/2019 0916 Gross per 24 hour  Intake 355 ml  Output 975 ml  Net -620 ml     Physical Exam: Vital Signs Blood pressure 128/88, pulse (!) 101, temperature 98.7 F (37.1 C), temperature source Oral, resp. rate 19, height 5\' 6"  (1.676 m), weight 74.4 kg, SpO2 93 %.  Nursing note and vitals reviewed.  General: pt asleep- awoke to verbal stimuli, appropriate, NAD CV: borderline tachycardia- rate 95-100- regular rhythm Pulm: actually sounded CTA B/L today- good air movement and sats 99-100% on pulse ox  GI: (+) PEG- soft, NT, ND, (+)BS Musculoskeletal:        General: No edema.     Comments:  Dry gangrene bilateral 1st and 2nd toes> 3rd toes  Neurological: Ox3 Skin: He is not diaphoretic. Still has slough in bed of wound, but better- less deep- granulating at edges well Psych:calm   Assessment/Plan: 1. 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  Physiatrist is providing close team supervision and 24 hour management of active medical problems listed below.  Physiatrist and rehab team continue to assess barriers to  discharge/monitor patient progress toward functional and medical goals  Care Tool:  Bathing    Body parts bathed by patient: Right arm, Left arm, Chest, Abdomen, Face   Body parts bathed by helper: Front perineal area, Buttocks, Right upper leg, Left upper leg, Right lower leg, Left lower leg     Bathing assist Assist Level: Moderate Assistance - Patient 50 - 74%     Upper Body Dressing/Undressing Upper body dressing   What is the patient wearing?: Pull over shirt    Upper body assist Assist Level: Minimal Assistance - Patient > 75%    Lower Body Dressing/Undressing Lower body dressing      What is the patient wearing?: Pants     Lower body assist Assist for lower body dressing: Maximal Assistance - Patient 25 - 49%     Toileting Toileting Toileting Activity did not occur (Clothing management and hygiene only): N/A (no void or bm)(foley)  Toileting assist Assist for toileting: Dependent - Patient 0%     Transfers Chair/bed transfer  Transfers assist  Chair/bed transfer activity did not occur: Safety/medical concerns(limited by dizziness, chest pain)  Chair/bed transfer assist level: Minimal Assistance - Patient > 75% Chair/bed transfer assistive device:   Ambulation assist   Ambulation activity did not occur: Safety/medical concerns(limited by dizziness, chest pain)  Assist level: Contact Guard/Touching  assist Assistive device: Parallel bars Max distance: 10'   Walk 10 feet activity   Assist  Walk 10 feet activity did not occur: Safety/medical concerns(limited by dizziness, chest pain)  Assist level: Contact Guard/Touching assist Assistive device: Parallel bars   Walk 50 feet activity   Assist Walk 50 feet with 2 turns activity did not occur: Safety/medical concerns(limited by dizziness, chest pain)         Walk 150 feet activity   Assist Walk 150 feet activity did not occur: Safety/medical concerns(limited  by dizziness, chest pain)         Walk 10 feet on uneven surface  activity   Assist Walk 10 feet on uneven surfaces activity did not occur: Safety/medical concerns(limited by dizziness, chest pain)         Wheelchair     Assist     Wheelchair activity did not occur: Safety/medical concerns(limited by dizziness, chest pain)         Wheelchair 50 feet with 2 turns activity    Assist    Wheelchair 50 feet with 2 turns activity did not occur: Safety/medical concerns(limited by dizziness, chest pain)       Wheelchair 150 feet activity     Assist  Wheelchair 150 feet activity did not occur: Safety/medical concerns(limited by dizziness, chest pain)       Blood pressure 128/88, pulse (!) 101, temperature 98.7 F (37.1 C), temperature source Oral, resp. rate 19, height 5\' 6"  (1.676 m), weight 74.4 kg, SpO2 93 %.  1.  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              -patient may shower             -ELOS/Goals: 2-3 weeks minA , Cont CIR  PT, OT, SLP 2.  Antithrombotics: -DVT/anticoagulation:  Pharmaceutical: Other (comment)--Eliquis             -antiplatelet therapy: N/A 3. Pain Management: Fentanyl IV prior to hydrotherapy. Will add oxycodone prn for sacral pain.   3/13- pain doing better- con't regimen for now.   3/14- R shoulder pain is doing better  4. Mood: LCSW to follow for evaluation and support.              -antipsychotic agents: N/A 5. Neuropsych: This patient appears to be capable of making decisions on his own behalf. 6. Sacral decubitus/Skin/Wound Care- Was unstageable- initially, covered with eshar initially- looks deep Stage III currently. : continue air mattress overlay.  Hydrotherapy 6 days a week. Tube feeds with additional protein supplement and vitamins to help promote wound healing.   3/13- have wet to dry on days not getting hydrotherapy.  3/17- plan to re-eval wound for treatment as  of next week- cleaning up nicely, per my assessment and WOC 7. Fluids/Electrolytes/Nutrition: Monitor I/O. Check lytes in am and weekly.  8. T2DM: Hgb A1c- 11.7--continue Levemir bid with SSI every 4 hours for elevated BS. Will consult dietician to start bolus tube feeds.    CBG (last 3)  Recent Labs    03/30/19 1641 03/30/19 2050 03/31/19 0617  GLUCAP 209* 150* 80   CBGs with some lability    3/14- increase Levemir to 27 units BID - am CBG on low side will need snack at noc  3/16- BGs variable/labile- con't meds for now  3/17- still variable- will consult DM team 9. Dizziness: Will change Cardura to bedtime to avoid SE. Will monitor orthostatic Bps  3/14- BP on soft side and slightly tachycardic, likely due to breahting meds and overall deconditioning- con't to monitor 10. 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   3/11- will add Duonebs QID- not prn-   3/12- improved SOB- con't regimen- discussed with resp therapy.  3/14- con't Duonebs-TID   3/17- will try and wean O2 to off if possible since pt's O2 sats 99-100% via TC- will consult resp for assistance.    12. Resting tachycardia: Due to deconditioning. No BB due to soft BP/orthostatic symptoms.  Monitor for now.    Vitals:   03/31/19 0905 03/31/19 0947  BP: (!) 161/91 128/88  Pulse: (!) 102 (!) 101  Resp: 19 19  Temp: 98.7 F (37.1 C)   SpO2: 96% 93%  mildly elevated likely due to deconditioning   3/16- pulse still in 100s- due to deconditioning- con't meds  3/17- BP elevated today- is rare- will con't to monitor-  13. Gangrenous changes bilateral feet: Will order off loading shoes. Continue to monitor.   Primarily affecting 1st and 2nd toes bilaterally - pedal and post tib pulses normal , likely small vessel infarcts   3/16- toes doing better- peels dry skin and looks like healing somewhat.  14. Abnormal LFTs: Recheck labs in am.   3/10- mild elevation of ALT only- will con't to  monitor 15. ABLA: Due to critical illness. Will check anemia panel.   3/10- looking better- con't treatment 16. Right Vocal fold immobility, dysphonia:s/p transnasal fiberoptic laryngoscopy by ENT on 3/9 but was aborted due to gagging and vomiting. ENT will re-attempt in a couple of days.   3/11- could hear a few words using PMV this AM- will con't to monitor  Pt ok with ENT re attempting laryngoscopy   3/16- will call ENT to get scope done for pt.  3/17- waiting for ENT  17. Leukocytosis-resolved  17. Disposition: Patient lives with wife and 5 children aged 31-23 who will provide support upon discharge.    LOS: 8 days A FACE TO FACE EVALUATION WAS PERFORMED  Andrew Bruce 03/31/2019, 11:06 AM

## 2019-03-31 NOTE — Progress Notes (Signed)
Occupational Therapy Session Note  Patient Details  Name: Valor Quaintance MRN: [id number] Date of Birth: 1965/03/15  Today's Date: 03/31/2019 OT Individual Time: 6237-6283 OT Individual Time Calculation (min): 45 min  and Today's Date: 03/31/2019 OT Missed Time: 15 Minutes Missed Time Reason: Patient ill (comment)(n/v)   Short Term Goals: Week 1:  OT Short Term Goal 1 (Week 1): Pt will tolerate out of bed for 2-3 hrs a day OT Short Term Goal 2 (Week 1): Pt will perform UB dressing wth mod A OT Short Term Goal 3 (Week 1): Pt will dress LB clothing (brief/ pants) with max A OT Short Term Goal 4 (Week 1): Pt will transfer to toilet with max A +1  Skilled Therapeutic Interventions/Progress Updates:    1;1. Interpreter present. Pain reported, but not rated in middle back. Heat provided at end of session.Pt received in bed agreeable to OT after RN finishes tube feed. Upon sitting EOB, pt begins coughing unable to produce mucous but reports something being there. Pt begins vomiting on floor and clothing with significant amount of nutrition support (just put in by RN) and mucous. Once finished, pt agreeable to changing clothing EOB with washcloth for washing face and underarms with S. MIN A to don large shirt and MOD A to stand d/t posterior bias keeping weight in heels. Pt completes oral care seated in bed with HOB elevated after edu re aspiration risk if another episode of emesis to occur with HOB flatter. Pt verbalized understanding. Exited session wihtpt seated in bed, exit alarm on and call light tin reach missing 15 min per pt requesting to lay down d/t fatigue/ill episode  Therapy Documentation Precautions:  Precautions Precautions: Fall, Other (comment) Precaution Comments: PMSV at all times, PEG;  NPO Restrictions Weight Bearing Restrictions: No Other Position/Activity Restrictions: HOB elevated 30 degrees at all times General:   Vital Signs: Therapy Vitals Temp: 98 F (36.7  C) Pulse Rate: (!) 105 Resp: 19 BP: (!) 129/94 Patient Position (if appropriate): Lying Oxygen Therapy SpO2: 99 % O2 Device: Tracheostomy Collar O2 Flow Rate (L/min): 5 L/min FiO2 (%): 28 % Pain:   ADL: ADL Eating: NPO Grooming: Maximal assistance Where Assessed-Grooming: Sitting at sink Upper Body Bathing: Minimal assistance Where Assessed-Upper Body Bathing: Sitting at sink Lower Body Bathing: Maximal assistance Where Assessed-Lower Body Bathing: Sitting at sink Upper Body Dressing: (hospital gown) Lower Body Dressing: (hospital gown and socks and ACE) Toileting: Unable to assess Toilet Transfer: Unable to assess Vision   Perception    Praxis   Exercises:   Other Treatments:     Therapy/Group: Individual Therapy  Shon Hale 03/31/2019, 1:51 PM

## 2019-03-31 NOTE — Procedures (Signed)
Preop diagnosis: Dysphonia, dysphagia, vocal fold paralysis Postop diagnosis: same Procedure: Transnasal fiberoptic laryngoscopy Surgeon: Jenne Pane Anesth: Topical with 4% lidocaine Compl: None Findings: Right vocal fold immobile in paramedian position.  Left vocal fold with very little mobility in more lateralized position.  Glottic airway widely patent but no glottic closure with attempts at phonation. Description:  After discussing risks, benefits, and alternatives, the patient was placed in a seated position and the right nasal passage was sprayed with topical anesthetic.  The fiberoptic scope was passed through the right nasal passage to view the pharynx and larynx.  Findings are noted above.  The scope was then removed and he was returned to nursing care in stable condition.  Rec: This is a difficult situation, likely a consequence of endotracheal intubation.  It is difficult to tell if the problem is due to neurologic damage or due to scarring at the posterior commissure, or possibly a combination.  I discussed the situation with his Rehab caregiver.  I recommended planning an operative evaluation of the larynx via direct laryngoscopy with potential injection augmentation versus steroid injection.  This will be scheduled next week.  Prognosis for recovery of function is unclear.  Continue NPO with tube feedings and tracheostomy status for the time-being.

## 2019-03-31 NOTE — Progress Notes (Signed)
Inpatient Diabetes Program Recommendations  AACE/ADA: New Consensus Statement on Inpatient Glycemic Control (2015)  Target Ranges:  Prepandial:   less than 140 mg/dL      Peak postprandial:   less than 180 mg/dL (1-2 hours)      Critically ill patients:  140 - 180 mg/dL   Lab Results  Component Value Date   GLUCAP 80 03/31/2019   HGBA1C 11.7 (H) 02/17/2019    Review of Glycemic Control Results for AURON, TADROS (MRN [id number]) as of 03/31/2019 11:25  Ref. Range 03/29/2019 06:27 03/29/2019 12:16 03/29/2019 16:48 03/29/2019 21:23  Glucose-Capillary Latest Ref Range: 70 - 99 mg/dL 84 425 (H) 956 (H) 387 (H)   Results for GIORDANO, GETMAN (MRN [id number]) as of 03/31/2019 11:25  Ref. Range 03/30/2019 06:07 03/30/2019 11:30 03/30/2019 16:41 03/30/2019 20:50 03/31/2019 06:17  Glucose-Capillary Latest Ref Range: 70 - 99 mg/dL 884 (H) 166 (H) 063 (H) 150 (H) 80   Current orders for Inpatient glycemic control:  Levemir 27 units bid Novolog 0-9 units tid + hs  Inpatient Diabetes Program Recommendations:    Osmolite boluses 4 x/day (800, 1200, 1600, 2000)  Trends indicate glucose increases after 12 bolus.  Consider Novolog 3 units Q24 hours at 1200 pm with bolus feed for Tube Feed Coverage to prevent spikes at supper and bedtime.  Will monitor with Novolog addition.  Thanks,  Christena Deem RN, MSN, BC-ADM Inpatient Diabetes Coordinator Team Pager [phone] (8a-5p)

## 2019-03-31 NOTE — Progress Notes (Signed)
Physical Therapy Wound Treatment Patient Details  Name: Andrew Bruce MRN: [id number] Date of Birth: Mar 28, 1965  Today's Date: 03/31/2019 PT Individual Time: 1006-1040 PT Individual Time Calculation (min): 34 min   Subjective  Subjective: Pt supine in bed on arrival, agreeable to session.   Patient and Family Stated Goals: None stated Prior Treatments: treatment on acute  Pain Score: pain 4/10  premedicated  Wound Assessment  Pressure Injury 02/22/19 Coccyx Medial Unstageable - Full thickness tissue loss in which the base of the injury is covered by slough (yellow, tan, gray, green or brown) and/or eschar (tan, brown or black) in the wound bed. quarter-sized/purple  (Active)  Wound Image   03/31/19 1051  Dressing Type Foam - Lift dressing to assess site every shift;Barrier Film (skin prep);Moist to dry 03/31/19 1051  Dressing Changed 03/31/19 1051  Dressing Change Frequency Daily 03/31/19 1051  State of Healing Other (Comment) 03/31/19 1051  Site / Wound Assessment Yellow;Pink 03/31/19 1051  % Wound base Red or Granulating 40% 03/31/19 1051  % Wound base Yellow/Fibrinous Exudate 60% 03/31/19 1051  % Wound base Black/Eschar 0% 03/31/19 1051  % Wound base Other/Granulation Tissue (Comment) 0% 03/31/19 1051  Peri-wound Assessment Intact 03/31/19 1051  Wound Length (cm) 5 cm 03/24/19 1531  Wound Width (cm) 2 cm 03/24/19 1531  Wound Depth (cm) 1.2 cm 03/24/19 1531  Wound Surface Area (cm^2) 10 cm^2 03/24/19 1531  Wound Volume (cm^3) 7 cm^3 03/24/19 1531  Tunneling (cm) 0 03/23/19 2122  Undermining (cm) 0 03/23/19 2122  Margins Unattached edges (unapproximated) 03/31/19 1051  Drainage Amount Minimal 03/31/19 1051  Drainage Description Sanguineous;No odor;Serous;Serosanguineous 03/31/19 1051  Treatment Cleansed;Debridement (Selective);Hydrotherapy (Pulse lavage);Packing (Saline gauze) 03/31/19 1051   Santyl applied to wound bed prior to applying dressing.    Hydrotherapy Pulsed  lavage therapy - wound location: Sacrum Pulsed Lavage with Suction (psi): 12 psi(4-12) Pulsed Lavage with Suction - Normal Saline Used: 1000 mL Pulsed Lavage Tip: Tip with splash shield Selective Debridement Selective Debridement - Location: Sacrum Selective Debridement - Tools Used: Forceps;Scalpel Selective Debridement - Tissue Removed: Thick, tenacious, yellow eschar   Wound Assessment and Plan  Wound Therapy - Assess/Plan/Recommendations Wound Therapy - Clinical Statement: Wound continues to present with adherent yellow eschar and partial areas of pink granulation.  Combination of Santyl and selective debridement is slowly improving the wound.   Pt continues to benefit from hydrotherapy for decreasing bio burden and for selective debridement to remove adherent eshar and promote granulation.  Goal is to continue to decrease necrosis and bioburden in wound bed.   Wound Therapy - Functional Problem List: Global weakness s/p COVID, CVA's, aortic clot Factors Delaying/Impairing Wound Healing: Multiple medical problems;Immobility Hydrotherapy Plan: Debridement;Dressing change;Patient/family education;Pulsatile lavage with suction Wound Therapy - Frequency: 6X / week Wound Therapy - Follow Up Recommendations: Home health RN Wound Plan: See above  Wound Therapy Goals- Improve the function of patient's integumentary system by progressing the wound(s) through the phases of wound healing (inflammation - proliferation - remodeling) by: Decrease Necrotic Tissue to: 10 Decrease Necrotic Tissue - Progress: Progressing toward goal Increase Granulation Tissue to: 90 Increase Granulation Tissue - Progress: Progressing toward goal Goals/treatment plan/discharge plan were made with and agreed upon by patient/family: Yes Time For Goal Achievement: 7 days Wound Therapy - Potential for Goals: Good  Goals will be updated until maximal potential achieved or discharge criteria met.  Discharge criteria: when  goals achieved, discharge from hospital, MD decision/surgical intervention, no progress towards goals, refusal/missing three consecutive treatments  without notification or medical reason.  Andrew Bruce     Andrew Bruce Andrew Bruce 03/31/2019, 10:52 AM

## 2019-03-31 NOTE — Progress Notes (Signed)
Speech Language Pathology Daily Session Note  Patient Details  Name: Andrew Bruce MRN: [id number] Date of Birth: [birth year]  Today's Date: 03/31/2019 SLP Individual Time: 0900-0930 SLP Individual Time Calculation (min): 30 min  Short Term Goals: Week 1: SLP Short Term Goal 1 (Week 1): Pt will complete oropharyngeal strengthening exercises to maximize swallow function and safety for return to safe PO intake SLP Short Term Goal 2 (Week 1): Pt will complete 5 repetitions with EMST 150 device set at 30 cmH20 and IMST device set at 10 cmH20  with mod A verbal instruction. SLP Short Term Goal 3 (Week 1): Pt will consume ice chips at bedside to rehabilitate musculature and to increase ROM. SLP Short Term Goal 3 - Progress (Week 1): Revised due to lack of progress SLP Short Term Goal 4 (Week 1): Pt will communicate basic wants and needs with Mod A verbal cues SLP Short Term Goal 5 (Week 1): Pt will demonstrate orientation x4 with mod A multimodal cues SLP Short Term Goal 5 - Progress (Week 1): Met SLP Short Term Goal 6 (Week 1): Pt will demonstrate donning and doffing of PMSV with mod A multimodal cues  Skilled Therapeutic Interventions:Skilled treatment sesison targeted communication goals. Pt appeared ill when SLP entered room. With question cues, pt able to communicate that he had chills, he was sweating with blankets pulled up around neck, he indicated that he throat hurt (not trach site) and he had an unproductive continuous cough. SLP and pt's nurse took vitals with and without PMSV in place. All were within normal limits. Pt missed 30 minutes of ST session d/t feeling ill. Pt left with his nurse in the room.      Pain    Therapy/Group: Individual Therapy  Desman Polak 03/31/2019, 2:30 PM

## 2019-03-31 NOTE — Consult Note (Signed)
WOC Nurse Consult Note: Patient receiving therapy in [facility] 4M13.   Patient continues to receive hydrotherapy and enzymatic debridement for the coccyx wound.  Please see PT hydrotherapy note from today's session.  Plan is to continue existing treatment plan for another week and re-evaluate.  Wound bed is cleaning up very nicely. Helmut Muster, RN, MSN, CWOCN, CNS-BC, pager [phone]

## 2019-03-31 NOTE — Consult Note (Signed)
Neuropsychological Consultation   Patient:   Andrew Bruce   DOB:   04-04-1965  MR Number:  [id number]  Location:  [facility] [address] CENTER B [id number] [place] [address] Dept: [phone] Loc: [phone]           Date of Service:   03/31/2019  Start Time:   2 PM End Time:   3 PM  Provider/Observer:  Arley Phenix, Psy.D.       Clinical Neuropsychologist       Billing Code/Service: [phone]  Chief Complaint:    Andrew Bruce is a 54 year old right-handed Hispanic male who speaks limited English.  The patient was originally admitted on 02/05/1998 10:21 day history of COVID-19 and progressive shortness of breath over the prior 48 hours.  The patient was treated medically but he decompensated requiring intubation on 1/24 with bilateral chest tubes placed for pneumothorax.  As sedation weaned on 2/3 he was noted to have decreased movement on right side and CT scan showed large area of subacute infarct in left occipital lobe and additional small area in the right frontal convexity.  Currently, the patient reports that his vision has returned to normal although it was difficult to assess.  The patient denies any apparent cognitive difficulties.  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  Palliative care was consulted to discuss goals of care and family elected for full scope treatment.  The patient has continued to show some improvements "continuing on respiratory therapies and assistance.  The patient has increased risk of fixation due to swallowing activity and partially closed airway.  The patient is significantly debilitated but is currently working on standing and strength with the CIR protocols.  Reason for Service:  Patient was referred for neuropsychological consultation due to coping and adjustment issues and assess  residual cognitive changes post stroke.  Below see HPI for the current admission.  HPI: Andrew Bruce is a 54 year old RH Hispanic male (limited english) who was originally admitted on 02/06/19 after 10 day history of Covid 19 and progressive SOB X 48  Hours. He was treated for Covid 19 ARDS with Remdesivir and Dexamethasone. He decompensated requiring intubation on 01/24 and bilateral chest tubes placed for pneumothoraces. As sedation weaned, on 02/03 he was noted to have decreased movement on right and CT head showed large area of subacute infarct in left occipital lobe and additional smaller area in right frontal convexity. 2D echo showed EF of 55 to 60% with moderately reduced systolic function of right ventricle globally consistent with acute cor pulmonale.  CT angio of chest done revealing LLL segmental pulmonary artery embolus, noncalcified plaque of the aortic arch, multifocal pneumonia background of emphysema and probable degree of interstitial fibrosis.  He was started on full dose Lovenox for treatment as well as IV antibiotics for LLL pneumonia. Follow-up MRI brain done on 3/05 due to ongoing encephalopathy and showed subacute infarcts in bilateral frontoparietal regions, splenium of corpus callosum on the right and most significantly left occipital lobe.  Dr. Roda Shutters felt the stroke was embolic in setting of Covid related hypercoagulopathy v/s vasculitis.  Gangrenous changes of the toes due to embolism being monitored.  Palliative care consulted to discuss goals of care and family elected on full scope treatment.  Tracheostomy performed 2/16 and he was slowly weaned to extubated to ATC by 02/22-->CFS #6 in place. Treated with  Unasyn 2/18-2/23 for witnessed aspiration event and PEG placed by Dr. Fredia Sorrow on 02/24.  Patient also with new diagnosis diabetes with hemoglobin A1c 11.7 and was started on Levemir.  Foley was placed briefly due to urinary retention and Flomax added to help with voiding  function.  He required frequent suctioning due to copious secretions however these are decreasing and he is tolerating PMSV during the day.  Hydrotherapy initiated 03/04 for treatment of unstageable sacral decub. He has been NPO due to severe dysphagia mid dysphonia and FEES done 3/9 showing fixed partially adducted right arytenoid with no movement and left arytenoid slightly more mobile with partially closed airway "putting patient at increased risk for asphyxiation especially with swallowing activity". ENT consulted for input.  Patient noted to be significantly debilitated and working on standing and standing for 1 to 2 minutes with orthostatic symptoms. Significant visual deficits reported with naming objects and able to answer basic mild/and questions with 100% accuracy per speech therapy.  Therapy ongoing and CIR recommended due to functional decline  Current Status:  The patient was alert and oriented today during the visit with translator present.  The patient has very limited English but appeared to understand somewhat what I was saying but more effective with translation.  The patient reports that his vision appeared to be functioning within normal limits and denied any apparent cognitive changes.  The patient's only complaint had to do with numbness in his hands and feet bilaterally.  The patient's reports that he is very worried about his ability to return to more normal functioning but does acknowledge improvement in his strength with rehabilitation efforts.  Also, talked with wife.  She had questions about his trach and when it might be removed which I could not answer with that was the primary issue.  The patient's wife reports that he appears to have returned to baseline cognitively.  She reports that she did not notice any residual cognitive changes and that he had also told her that his vision was at baseline.  She also denied any severe depressive symptoms.  Behavioral Observation: Andrew Bruce  presents as a 54 y.o.-year-old Right Hispanic Male who appeared his stated age. his dress was Appropriate and he was Well Groomed and his manners were Appropriate to the situation.  his participation was indicative of Appropriate and Redirectable behaviors.  There were physical disabilities noted.  he displayed an appropriate level of cooperation and motivation.     Interactions:    Active Appropriate and Redirectable  Attention:   abnormal and attention span appeared shorter than expected for age  Memory:   within normal limits; recent and remote memory intact  Visuo-spatial:  not examined but there are clear potential for visual-spatial and visual deficits due to occipital stroke.  Speech (Volume):  low  Speech:   normal; normal  Thought Process:  Coherent and Relevant  Though Content:  WNL; not suicidal and not homicidal  Orientation:   person, place, time/date and situation  Judgment:   Fair  Planning:   Fair  Affect:    Appropriate  Mood:    Dysphoric  Insight:   Fair  Intelligence:   normal  Medical History:  History reviewed. No pertinent past medical history.   Psychiatric History:  Patient denies any prior psychiatric history.  Family Med/Psych History:  Family History  Problem Relation Age of Onset  . Healthy Sister   . Healthy Brother      Impression/DX:  Seneca Hoback is a  54 year old right-handed Hispanic male who speaks limited [place].  The patient was originally admitted on 02/05/1998 10:21 day history of COVID-19 and progressive shortness of breath over the prior 48 hours.  The patient was treated medically but he decompensated requiring intubation on 1/24 with bilateral chest tubes placed for pneumothorax.  As sedation weaned on 2/3 he was noted to have decreased movement on right side and CT scan showed large area of subacute infarct in left occipital lobe and additional small area in the right frontal convexity.  Currently, the patient  reports that his vision has returned to normal although it was difficult to assess.  The patient denies any apparent cognitive difficulties.  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  Palliative care was consulted to discuss goals of care and family elected for full scope treatment.  The patient has continued to show some improvements "continuing on respiratory therapies and assistance.  The patient has increased risk of fixation due to swallowing activity and partially closed airway.  The patient is significantly debilitated but is currently working on standing and strength with the CIR protocols.  The patient was alert and oriented today during the visit with translator present.  The patient has very limited English but appeared to understand somewhat what I was saying but more effective with translation.  The patient reports that his vision appeared to be functioning within normal limits and denied any apparent cognitive changes.  The patient's only complaint had to do with numbness in his hands and feet bilaterally.  The patient's reports that he is very worried about his ability to return to more normal functioning but does acknowledge improvement in his strength with rehabilitation efforts.  Also, talked with wife.  She had questions about his trach and when it might be removed which I could not answer with that was the primary issue.  The patient's wife reports that he appears to have returned to baseline cognitively.  She reports that she did not notice any residual cognitive changes and that he had also told her that his vision was at baseline.  She also denied any severe depressive symptoms.  Disposition/Plan:  Will follow up with the patient next week.  Diagnosis:    Leukocytosis - Plan: DG CHEST PORT 1 VIEW, DG CHEST PORT 1 VIEW         Electronically Signed   _______________________ Ilean Skill,  Psy.D.

## 2019-03-31 NOTE — Progress Notes (Signed)
Social Work Patient ID: Andrew Bruce, male   DOB: Apr 07, 1965, 54 y.o.   MRN: [id number]   SW met with pt and pt wife in room using [facility] ID [phone] to provide updates from team conference, and anticipate d/c date within 4 weeks. SW informed there will be follow-up with continued updates.    Loralee Pacas, MSW, [place] Office: [phone] Cell: [phone] Fax: [phone]

## 2019-04-01 ENCOUNTER — Inpatient Hospital Stay (HOSPITAL_COMMUNITY): Payer: Self-pay | Admitting: Speech Pathology

## 2019-04-01 ENCOUNTER — Inpatient Hospital Stay (HOSPITAL_COMMUNITY): Payer: Self-pay | Admitting: Occupational Therapy

## 2019-04-01 ENCOUNTER — Inpatient Hospital Stay (HOSPITAL_COMMUNITY): Payer: Self-pay

## 2019-04-01 LAB — GLUCOSE, CAPILLARY
Glucose-Capillary: 88 mg/dL (ref 70–99)
Glucose-Capillary: 76 mg/dL (ref 70–99)
Glucose-Capillary: 197 mg/dL — ABNORMAL HIGH (ref 70–99)
Glucose-Capillary: 56 mg/dL — ABNORMAL LOW (ref 70–99)
Glucose-Capillary: 138 mg/dL — ABNORMAL HIGH (ref 70–99)
Glucose-Capillary: 291 mg/dL — ABNORMAL HIGH (ref 70–99)

## 2019-04-01 NOTE — Progress Notes (Signed)
[place] PHYSICAL MEDICINE & REHABILITATION PROGRESS NOTE   Subjective/Complaints:  Pt denies pain currently, but with therapy still has R shoulder pain.  Seen by ENT- will take to IR next week to get a better look- it's concerning because vocal cords are not moving at all and appear atrophied.      ROS:  Pt denies SOB, abd pain, CP, N/V/C/D, and vision changes     Objective:   No results found. No results for input(s): WBC, HGB, HCT, PLT in the last 72 hours. No results for input(s): NA, K, CL, CO2, GLUCOSE, BUN, CREATININE, CALCIUM in the last 72 hours.  Intake/Output Summary (Last 24 hours) at 04/01/2019 0904 Last data filed at 04/01/2019 0824 Gross per 24 hour  Intake 355 ml  Output 850 ml  Net -495 ml     Physical Exam: Vital Signs Blood pressure 115/83, pulse 96, temperature 98.8 F (37.1 C), temperature source Oral, resp. rate 18, height 5\' 6"  (1.676 m), weight 74.4 kg, SpO2 94 %.  Nursing note and vitals reviewed.  General: pt sitting/laying in dark, quiet- ,NAD CV: mild tachycardia rate 100-105 on pulse ox; no JVD Pulm:  slightly coarse in bases- with expiration, but otherwise, Clear B/L- no W/R/R GI: (+)PEG soft, NT, ND, (+)BS Musculoskeletal:        General: No edema.     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    Psych:Calm   Assessment/Plan: 1. 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  Physiatrist is providing close team supervision and 24 hour management of active medical problems listed below.  Physiatrist and rehab team continue to assess barriers to discharge/monitor patient progress toward functional and medical goals  Care Tool:   Bathing    Body parts bathed by patient: Right arm, Left arm, Chest, Abdomen, Face   Body parts bathed by helper: Front perineal area, Buttocks, Right upper leg, Left upper leg, Right lower leg, Left lower leg     Bathing assist Assist Level: Moderate Assistance - Patient 50 - 74%     Upper Body Dressing/Undressing Upper body dressing   What is the patient wearing?: Pull over shirt    Upper body assist Assist Level: Minimal Assistance - Patient > 75%    Lower Body Dressing/Undressing Lower body dressing      What is the patient wearing?: Pants     Lower body assist Assist for lower body dressing: Maximal Assistance - Patient 25 - 49%     Toileting Toileting Toileting Activity did not occur (Clothing management and hygiene only): N/A (no void or bm)(foley)  Toileting assist Assist for toileting: Dependent - Patient 0%     Transfers Chair/bed transfer  Transfers assist  Chair/bed transfer activity did not occur: Safety/medical concerns(limited by dizziness, chest pain)  Chair/bed transfer assist level: Minimal Assistance - Patient > 75% Chair/bed transfer assistive device:   Ambulation assist   Ambulation activity did not occur: Safety/medical concerns(limited by dizziness, chest pain)  Assist level: Contact Guard/Touching assist Assistive device: Parallel bars Max distance: 10'   Walk 10 feet activity   Assist  Walk 10 feet activity did not occur: Safety/medical concerns(limited by dizziness, chest pain)  Assist  level: Contact Guard/Touching assist Assistive device: Parallel bars   Walk 50 feet activity   Assist Walk 50 feet with 2 turns activity did not occur: Safety/medical concerns(limited by dizziness, chest pain)         Walk 150 feet activity   Assist Walk 150 feet activity did not occur: Safety/medical concerns(limited by dizziness, chest pain)         Walk 10 feet on uneven surface  activity    Assist Walk 10 feet on uneven surfaces activity did not occur: Safety/medical concerns(limited by dizziness, chest pain)         Wheelchair     Assist     Wheelchair activity did not occur: Safety/medical concerns(limited by dizziness, chest pain)         Wheelchair 50 feet with 2 turns activity    Assist    Wheelchair 50 feet with 2 turns activity did not occur: Safety/medical concerns(limited by dizziness, chest pain)       Wheelchair 150 feet activity     Assist  Wheelchair 150 feet activity did not occur: Safety/medical concerns(limited by dizziness, chest pain)       Blood pressure 115/83, pulse 96, temperature 98.8 F (37.1 C), temperature source Oral, resp. rate 18, height 5\' 6"  (1.676 m), weight 74.4 kg, SpO2 94 %.  1.  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              -patient may shower             -ELOS/Goals: 2-3 weeks minA , Cont CIR  PT, OT, SLP 2.  Antithrombotics: -DVT/anticoagulation:  Pharmaceutical: Other (comment)--Eliquis             -antiplatelet therapy: N/A 3. Pain Management: Fentanyl IV prior to hydrotherapy. Will add oxycodone prn for sacral pain.   3/13- pain doing better- con't regimen for now.   3/14- R shoulder pain is doing better  4. Mood: LCSW to follow for evaluation and support.              -antipsychotic agents: N/A 5. Neuropsych: This patient appears to be capable of making decisions on his own behalf. 6. Sacral decubitus/Skin/Wound Care- Was unstageable- initially, covered with eshar initially- looks deep Stage III currently. : continue air mattress overlay.  Hydrotherapy 6 days a week. Tube feeds with additional protein supplement and vitamins to help promote wound healing.   3/13- have wet to dry on days not getting hydrotherapy.  3/17- plan to re-eval wound for treatment as of next week- cleaning up nicely, per my assessment and WOC  3/18- got pic of wound- a  little slough and eschar left- but improving- con't wound care/hydrotherapy for a least 1 week. 7. Fluids/Electrolytes/Nutrition: Monitor I/O. Check lytes in am and weekly.  8. T2DM: Hgb A1c- 11.7--continue Levemir bid with SSI every 4 hours for elevated BS. Will consult dietician to start bolus tube feeds.    CBG (last 3)  Recent Labs    03/31/19 2040 03/31/19 2255 04/01/19 0623  GLUCAP 218* 126* 88   CBGs with some lability    3/14- increase Levemir to 27 units BID - am CBG on low side will need snack at noc  3/16- BGs variable/labile- con't meds for now  3/17- still variable- will consult DM team  3/18- DM team added Novolog 3 units- will monitor 9. Dizziness: Will change Cardura to bedtime to avoid SE. Will monitor orthostatic Bps  3/14- BP  on soft side and slightly tachycardic, likely due to breahting meds and overall deconditioning- con't to monitor 10. 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   3/11- will add Duonebs QID- not prn-   3/12- improved SOB- con't regimen- discussed with resp therapy.  3/14- con't Duonebs-TID   3/17- will try and wean O2 to off if possible since pt's O2 sats 99-100% via TC- will consult resp for assistance.    12. Resting tachycardia: Due to deconditioning. No BB due to soft BP/orthostatic symptoms.  Monitor for now.    Vitals:   04/01/19 0500 04/01/19 0710  BP: 115/83   Pulse: 100 96  Resp: 18 18  Temp: 98.8 F (37.1 C)   SpO2: 92% 94%  mildly elevated likely due to deconditioning   3/16- pulse still in 100s- due to deconditioning- con't meds  3/17- BP elevated today- is rare- will con't to monitor-  13. Gangrenous changes bilateral feet: Will order off loading shoes. Continue to monitor.   Primarily affecting 1st and 2nd toes bilaterally - pedal and post tib pulses normal , likely small vessel infarcts   3/16- toes doing better- peels dry skin and looks like healing somewhat.   3/17- slowly improving-  peeling dry skin. 14. Abnormal LFTs: Recheck labs in am.   3/10- mild elevation of ALT only- will con't to monitor 15. ABLA: Due to critical illness. Will check anemia panel.   3/10- looking better- con't treatment 16. Right Vocal fold immobility, dysphonia:s/p transnasal fiberoptic laryngoscopy by ENT on 3/9 but was aborted due to gagging and vomiting. ENT will re-attempt in a couple of days.   3/11- could hear a few words using PMV this AM- will con't to monitor  Pt ok with ENT re attempting laryngoscopy   3/16- will call ENT to get scope done for pt.  3/17- waiting for ENT   3/18- ENT scoped pt- going to OR next week- will arrange per ENT 17. Leukocytosis-resolved  17. Disposition: Patient lives with wife and 5 children aged 18-23 who will provide support upon discharge.    LOS: 9 days A FACE TO FACE EVALUATION WAS PERFORMED  Andrew Bruce 04/01/2019, 9:04 AM

## 2019-04-01 NOTE — Progress Notes (Signed)
Physical Therapy Session Note  Patient Details  Name: Andrew Bruce MRN: [id number] Date of Birth: 03-Jan-1966  Today's Date: 04/01/2019 PT Individual Time: 2979-8921 PT Individual Time Calculation (min): 70 min   Short Term Goals: Week 2:  PT Short Term Goal 1 (Week 2): Patient will consistently perform all bed mobility with supervision without use of bed rails. PT Short Term Goal 2 (Week 2): Patient will perform basic transfers with close supervision using LRAD. PT Short Term Goal 3 (Week 2): Patient will ambulate >25 ft using LRAD. PT Short Term Goal 4 (Week 2): Patient will tolerate sitting OOB >1 hour.  Skilled Therapeutic Interventions/Progress Updates:     Patient in bed on 28% FiO2 via trach collar upon PT arrival. Patient alert and agreeable to PT session. Patient reported mild sacral pain during session, RN made aware. PT provided repositioning, rest breaks, and distraction as pain interventions throughout session.   Therapeutic Activity: Bed Mobility: Patient performed supine to/from sit with supervision-min A. Provided verbal cues for rolling to R side then pushing up through his UEs, patient had difficulty following cues and required assist for trunk support. Transfers: Patient performed sit to/from stand x4, stand pivot x2, and toilet transfer to the [facility] over the toilet x2 with min A with RW, no AD, and a grab bar in the bathroom. Provided verbal cues for hand placement with ADs, pushing up from a surface, and reaching back to sit for safety. Patient was continent of bowl x2 during session, see flowsheet for details. Required total A for peri-care and mod-max A for managing pants and incontinence brief during toileting.  Gait Training:  Patient ambulated ~12 feet to/from the bathroom x1 with RW and x1 with HHA and 53 feet and 18 feet with B UEs on PT shoulders with min A. Ambulated in non-skid socks due to patient's shoes causing increased toe pain when attempting to don them,  will discuss alternat protective shoe options with rehab team. He ambulated with very small step length, increased step height, carried over from cues in a prior session, intermittent posterior lean increased when focused on heel weight bearing, and narrow BOS. Provided verbal cues for heel weight bearing to protect toes, paced breathing, and increased BOS.  Wheelchair Mobility:  Patient was transported in the TIS w/c with total A throughout session for energy conservation and time management.  Patient sitting EOB and handed off to [place], OT at end of session.  Vitals throughout session: HR 101-128  SPO2 90-94% on 28% FiO2 via trach collar   Therapy Documentation Precautions:  Precautions Precautions: Fall, Other (comment) Precaution Comments: PMSV at all times, PEG;  NPO Restrictions Weight Bearing Restrictions: No Other Position/Activity Restrictions: HOB elevated 30 degrees at all times    Therapy/Group: Individual Therapy  Edan Juday L Christyna Letendre PT, DPT  04/01/2019, 7:25 PM

## 2019-04-01 NOTE — Progress Notes (Signed)
Continue to show improvement in assisting in basic care needs by using  gestures, thumbs up and using the urinal with some assistance from staff, more direct eye contact also noted. Trach care provided several times during shift with thin secretions noted, trach care provide. Patient is indicating that he only want one container of the bolus TF,this was provided per request.Informed oncoming nurse( K.Winter, RN)of this also.Made as comfortable as possible continue medical regime. Refer to Documentation Flow sheet .

## 2019-04-01 NOTE — Progress Notes (Signed)
Late entry Compazine administered via peg tube for discomfort of nausea, no vomiting present and monitoried

## 2019-04-01 NOTE — Progress Notes (Addendum)
Hypoglycemic Event  CBG: 56  Treatment: Bolus Osmolite feeding  Symptoms:  None   Follow-up CBG :   Time: 2043   CBG Result: 138  Possible Reasons for Event: Unknown  Comments/MD    PA notified  Patient  Asymptomatic, alert oriented no acute distress    Andrew Bruce

## 2019-04-01 NOTE — Progress Notes (Signed)
Physical Therapy Wound Treatment Patient Details  Name: Andrew Bruce MRN: [id number] Date of Birth: 03/03/1965  Today's Date: 04/01/2019 PT Individual Time: 0900-0928 PT Individual Time Calculation (min): 28 min   Subjective  Subjective: Pt supine in bed on arrival, agreeable to session.   Patient and Family Stated Goals: None stated Prior Treatments: treatment on acute  Pain Score:  4+/10,  Premedicated prior to treatment, but still flinching with debridement  Wound Assessment  Pressure Injury 02/22/19 Coccyx Medial Unstageable - Full thickness tissue loss in which the base of the injury is covered by slough (yellow, tan, gray, green or brown) and/or eschar (tan, brown or black) in the wound bed. quarter-sized/purple  (Active)  Wound Image   03/31/19 1051  Dressing Type Foam - Lift dressing to assess site every shift;Barrier Film (skin prep);Moist to dry 04/01/19 1035  Dressing Changed 04/01/19 1035  Dressing Change Frequency Daily 04/01/19 1035  State of Healing Other (Comment) 04/01/19 1035  Site / Wound Assessment Yellow;Pink 04/01/19 1035  % Wound base Red or Granulating 45% 04/01/19 1035  % Wound base Yellow/Fibrinous Exudate 55% 04/01/19 1035  % Wound base Black/Eschar 0% 04/01/19 1035  % Wound base Other/Granulation Tissue (Comment) 0% 04/01/19 1035  Peri-wound Assessment Intact 04/01/19 1035  Wound Length (cm) 5 cm 04/01/19 1035  Wound Width (cm) 2 cm 04/01/19 1035  Wound Depth (cm) 1.5 cm 04/01/19 1035  Wound Surface Area (cm^2) 10 cm^2 04/01/19 1035  Wound Volume (cm^3) 15 cm^3 04/01/19 1035  Tunneling (cm) 0 03/23/19 2122  Undermining (cm) 0 03/23/19 2122  Margins Unattached edges (unapproximated) 04/01/19 1035  Drainage Amount Minimal 04/01/19 1035  Drainage Description Sanguineous;No odor;Serous;Serosanguineous 04/01/19 1035  Treatment Cleansed;Debridement (Selective);Hydrotherapy (Pulse lavage);Packing (Saline gauze);Other (Comment) 04/01/19 1035     Santyl  applied to wound bed prior to applying dressing.  Hydrotherapy Pulsed lavage therapy - wound location: Sacrum Pulsed Lavage with Suction (psi): 12 psi(4-12) Pulsed Lavage with Suction - Normal Saline Used: 1000 mL Pulsed Lavage Tip: Tip with splash shield Selective Debridement Selective Debridement - Location: Sacrum Selective Debridement - Tools Used: Forceps;Scalpel Selective Debridement - Tissue Removed: Thinning, tenacious, yellow eschar   Wound Assessment and Plan  Wound Therapy - Assess/Plan/Recommendations Wound Therapy - Clinical Statement: Wound continues to present with adherent yet thinner yellow eschar and expanding partial areas of pink granulation.  Combination of Santyl and selective debridement is slowly improving the wound.   Pt continues to benefit from hydrotherapy for decreasing bio burden and for selective debridement to remove adherent eshar and promote granulation.  Goal is to continue to decrease necrosis and bioburden in wound bed.   Wound Therapy - Functional Problem List: Improving weakness s/p COVID, CVA's, aortic clot Factors Delaying/Impairing Wound Healing: Multiple medical problems;Immobility Hydrotherapy Plan: Debridement;Dressing change;Patient/family education;Pulsatile lavage with suction Wound Therapy - Frequency: 6X / week Wound Therapy - Follow Up Recommendations: Home health RN Wound Plan: See above  Wound Therapy Goals- Improve the function of patient's integumentary system by progressing the wound(s) through the phases of wound healing (inflammation - proliferation - remodeling) by: Decrease Necrotic Tissue to: 10 Decrease Necrotic Tissue - Progress: Progressing toward goal Increase Granulation Tissue to: 90 Increase Granulation Tissue - Progress: Progressing toward goal Goals/treatment plan/discharge plan were made with and agreed upon by patient/family: Yes Time For Goal Achievement: 7 days Wound Therapy - Potential for Goals: Good  Goals  will be updated until maximal potential achieved or discharge criteria met.  Discharge criteria: when goals achieved, discharge from  hospital, MD decision/surgical intervention, no progress towards goals, refusal/missing three consecutive treatments without notification or medical reason.  GP     [name] [name] 04/01/2019, 10:39 AM 04/01/2019  [name], PT Acute Rehabilitation Services [phone]  (pager) [phone]  (office)

## 2019-04-01 NOTE — Progress Notes (Signed)
Speech Language Pathology Weekly Progress and Session Note  Patient Details  Name: Andrew Bruce MRN: [id number] Date of Birth: 01/15/1965   Today's Date: 04/01/2019 SLP Individual Time: 1100-1155 SLP Individual Time Calculation (min): 55 min Beginning of progress report period: March 25, 2019 End of progress report period: April 02, 2019   Short Term Goals: Week 1: SLP Short Term Goal 1 (Week 1): Pt will complete oropharyngeal strengthening exercises to maximize swallow function and safety for return to safe PO intake SLP Short Term Goal 1 - Progress (Week 1): Progressing toward goal SLP Short Term Goal 2 (Week 1): Pt will complete 5 repetitions with EMST 150 device set at 30 cmH20 and IMST device set at 10 cmH20  with mod A verbal instruction. SLP Short Term Goal 2 - Progress (Week 1): Other (comment)(not addressed this reporting period) SLP Short Term Goal 3 (Week 1): Pt will consume ice chips at bedside to rehabilitate musculature and to increase ROM. SLP Short Term Goal 3 - Progress (Week 1): Progressing toward goal SLP Short Term Goal 4 (Week 1): Pt will communicate basic wants and needs with Mod A verbal cues SLP Short Term Goal 4 - Progress (Week 1): Met SLP Short Term Goal 5 (Week 1): Pt will demonstrate orientation x4 with mod A multimodal cues SLP Short Term Goal 5 - Progress (Week 1): Met SLP Short Term Goal 6 (Week 1): Pt will demonstrate donning and doffing of PMSV with mod A multimodal cues SLP Short Term Goal 6 - Progress (Week 1): Not met    New Short Term Goals: Week 2: SLP Short Term Goal 1 (Week 2): Pt will complete 5 repetitions with EMST 150 device set at 30 cmH20 and IMST device set at 10 cmH20  with mod A verbal instruction. SLP Short Term Goal 2 (Week 2): Pt will demonstrate donning and doffing of PMSV with mod A multimodal cues SLP Short Term Goal 3 (Week 2): Pt will demonstrate knowledge of vocal cord function/impairment by answering yes/no questions accuracy  in 7 out of 10 opportunity with Min A cues. SLP Short Term Goal 4 (Week 2): Pt will communicate basic wants and needs with supervision level questions for verification of message. SLP Short Term Goal 5 (Week 2): Pt will consume ice chips at bedside to rehabilitate musculature and to increase ROM.  Weekly Progress Updates: Pt has made good progress and as a result he has met 3 of 6 STGs. Much of this reporting period has focused on education regarding trach, PMSV and description of abnormalities observed during FEES. As such pt was agreeable to ENT consult with procedure scheduled for upcoming week. Therapy has also targeted an additional goal of consuming ice chips to persevere swallow musculature as well as donning and doffing PMSV. Pt continues to require assistance when donning/doffing PMSV as well as comprehension of above mentioned concepts. As such, pt requires skilled ST to target the above listed STGs.       Daily Session  Skilled Therapeutic Interventions: Skilled treatment session targeted dysphagia and cognition goals. SLP facilitated session by providing Mod A to Min A cues for pt to demonstrate understanding of general vocal cord function and basic description of ENT's findings. SLP also provided skilled observation of pt consuming ice chips. Pt has very sensitive teeth and is difficult for him to consume more than 2 - 3 spoonfuls per session. Pt left in bed, PMSV on and all needs within reach. Continue per current plan of care.       General    Pain    Therapy/Group: Individual Therapy  Sammuel Blick 04/01/2019, 9:13 AM

## 2019-04-01 NOTE — Progress Notes (Signed)
Occupational Therapy Session Note  Patient Details  Name: Andrew Bruce MRN: [id number] Date of Birth: [birth year]  Today's Date: 04/01/2019 OT Individual Time: 1415-1500 OT Individual Time Calculation (min): 45 min    Short Term Goals: Week 1:  OT Short Term Goal 1 (Week 1): Pt will tolerate out of bed for 2-3 hrs a day OT Short Term Goal 2 (Week 1): Pt will perform UB dressing wth mod A OT Short Term Goal 3 (Week 1): Pt will dress LB clothing (brief/ pants) with max A OT Short Term Goal 4 (Week 1): Pt will transfer to toilet with max A +1 Week 2:     Skilled Therapeutic Interventions/Progress Updates:    1:1 Pt just finished PT and was sitting EOB . Pt transferred into the w/c with min A for steadying A. PT able to come up in to standing without lifting A. Pt declined bathing and dressing today. Tolerated sitting in the w/c upright and reclined for ~45 min. Participated in grooming at the sink with setup; pt washed his face and applied shaving cream. Ot performed shaving at pt's request. Did discuss with MD about showering at her request. Would recommend performing before hydro therapy. Pt able to doff socks with min A. Pt soaked feet and were washed with total A. PA requested gauze between each toe. Total A to don clean new socks. PT transfer back into bed with min A. Pt able to get into supine with supervision. Pt was able to tolerate the session on RA with sats ranging from 89 to 93% with PMSV donned. Left pt on the trach collar resting in the bed.    Therapy Documentation Precautions:  Precautions Precautions: Fall, Other (comment) Precaution Comments: PMSV at all times, PEG;  NPO Restrictions Weight Bearing Restrictions: No Other Position/Activity Restrictions: HOB elevated 30 degrees at all times General:   Vital Signs: Therapy Vitals Temp: 98.4 F (36.9 C) Temp Source: Oral Pulse Rate: 94 Resp: 17 BP: 115/73 Patient Position (if appropriate): Lying Oxygen  Therapy SpO2: (!) 89 % O2 Device: Tracheostomy Collar O2 Flow Rate (L/min): 5 L/min FiO2 (%): 28 % Pain:  pt c/o pain in right side of neck- MD made aware. Pt does present with some swelling laterally of the neck =. Also with some buttock pain - requested meds- RN came in to give pt  Therapy/Group: Individual Therapy  Roney Mans [facility] 04/01/2019, 9:39 PM

## 2019-04-02 ENCOUNTER — Inpatient Hospital Stay (HOSPITAL_COMMUNITY): Payer: Self-pay | Admitting: Physical Therapy

## 2019-04-02 ENCOUNTER — Inpatient Hospital Stay (HOSPITAL_COMMUNITY): Payer: Self-pay | Admitting: Occupational Therapy

## 2019-04-02 LAB — GLUCOSE, CAPILLARY
Glucose-Capillary: 170 mg/dL — ABNORMAL HIGH (ref 70–99)
Glucose-Capillary: 86 mg/dL (ref 70–99)
Glucose-Capillary: 155 mg/dL — ABNORMAL HIGH (ref 70–99)
Glucose-Capillary: 124 mg/dL — ABNORMAL HIGH (ref 70–99)

## 2019-04-02 MED ORDER — JUVEN PO PACK
1.0000 | PACK | Freq: Three times a day (TID) | ORAL | Status: DC
  Administered 2019-04-02 – 2019-04-08 (×15): 1
  Filled 2019-04-02 (×15): qty 1

## 2019-04-02 MED ORDER — PRO-STAT SUGAR FREE PO LIQD: 30.0000 mL | Freq: Four times a day (QID) | ORAL | Status: DC

## 2019-04-02 MED ORDER — OSMOLITE 1.5 CAL PO LIQD
237.0000 mL | Freq: Every day | ORAL | Status: DC
  Administered 2019-04-02 – 2019-04-07 (×22): 237 mL
  Filled 2019-04-02 (×27): qty 237

## 2019-04-02 MED ORDER — GUAIFENESIN 100 MG/5ML PO SOLN
10.0000 mL | Freq: Four times a day (QID) | ORAL | Status: DC
  Administered 2019-04-02 – 2019-04-20 (×68): 200 mg
  Filled 2019-04-02 (×4): qty 10
  Filled 2019-04-02: qty 100
  Filled 2019-04-02 (×8): qty 10
  Filled 2019-04-02: qty 100
  Filled 2019-04-02 (×2): qty 10
  Filled 2019-04-02: qty 20
  Filled 2019-04-02 (×20): qty 10
  Filled 2019-04-02: qty 100
  Filled 2019-04-02 (×3): qty 10
  Filled 2019-04-02: qty 20
  Filled 2019-04-02 (×26): qty 10

## 2019-04-02 NOTE — Progress Notes (Signed)
RT NOTE: Patient currently on room air temporarily as PT is working with patient on bathing. Andrew Bruce is secure and cover has been placed over trach to protect it while PT is working with patient. Vitals are stable and oxygen is steady in low to mid 90's. RT will continue to monitor.

## 2019-04-02 NOTE — Progress Notes (Signed)
Physical Therapy Session Note  Patient Details  Name: Andrew Bruce MRN: [id number] Date of Birth: 25-Apr-1965  Today's Date: 04/02/2019 PT Individual Time: 1310-1415 PT Individual Time Calculation (min): 65 min   Short Term Goals: Week 2:  PT Short Term Goal 1 (Week 2): Patient will consistently perform all bed mobility with supervision without use of bed rails. PT Short Term Goal 2 (Week 2): Patient will perform basic transfers with close supervision using LRAD. PT Short Term Goal 3 (Week 2): Patient will ambulate >25 ft using LRAD. PT Short Term Goal 4 (Week 2): Patient will tolerate sitting OOB >1 hour.  Skilled Therapeutic Interventions/Progress Updates: Pt presents supine in bed, receiving nursing care.  Pt agreeable to participate in therapy.  Interpreter present throughout full treatment.  Pt required min A for sup to sit using side rail, from air mattress deflated.  Pt sat EOB to don bilateral Darco shoes and change O2 to portable, 5 LPM.  Pt performed multiple sit to stand transfers from bed and mat table and w/c w/ CGA and occasionally min A from lower surfaces.  O2 sats remained greater than 94% throughout all activity, but HR increased to 112.  Pt performed standing balance activities w/ peg board.  Pt had no LOB, but encouragement needed to utilize right UE.  Pt required seated rest breaks in TIS w/c tilted back.  Pt performed toe-taps to 3 3/4"  w/ HHA and verbal cues for safety. Pt amb multiple trials w/o AD up to 90' and min A.  Pt had multiple LOB requiring PT assist to regain, especially w/ turns and through doorways.  Pt returned to bed post-rx w/ supervision.  All needs within reach and bed alarm on.  PA in room w/ patient.     Therapy Documentation Precautions:  Precautions Precautions: Fall, Other (comment) Precaution Comments: PMSV at all times, PEG;  NPO Restrictions Weight Bearing Restrictions: No Other Position/Activity Restrictions: HOB elevated 30 degrees at all  times General: PT Amount of Missed Time (min): 10 Minutes PT Missed Treatment Reason: Nursing care Vital Signs: Therapy Vitals Pulse Rate: 98 Resp: 19 BP: 119/85 Patient Position (if appropriate): Lying Oxygen Therapy SpO2: 97 % O2 Device: Room Air Pain:   no c/o pain     Therapy/Group: Individual Therapy  [name] 04/02/2019, 3:36 PM

## 2019-04-02 NOTE — Progress Notes (Signed)
[facility] PHYSICAL MEDICINE & REHABILITATION PROGRESS NOTE   Subjective/Complaints:  Pt reports doing well- toes hurting more- likely due to washing yesterday and gauze between toes, per PA.   Seen by DM coordinator- decreased levemir to 25 units BID and decreased SSI to sensitive.    ROS:  Pt denies SOB, abd pain, CP, N/V/C/D, and vision changes     Objective:   No results found. No results for input(s): WBC, HGB, HCT, PLT in the last 72 hours. No results for input(s): NA, K, CL, CO2, GLUCOSE, BUN, CREATININE, CALCIUM in the last 72 hours.  Intake/Output Summary (Last 24 hours) at 04/02/2019 1941 Last data filed at 04/02/2019 1300 Gross per 24 hour  Intake 100 ml  Output 650 ml  Net -550 ml     Physical Exam: Vital Signs Blood pressure (!) 135/94, pulse 100, temperature 98.2 F (36.8 C), resp. rate 18, height 5\' 6"  (1.676 m), weight 74.4 kg, SpO2 99 %.  Nursing note and vitals reviewed.  General: pt awake, alert, appropriate, laying supine in bed; NAD CV: borderline tachycardia at 95-100- regular rhythm Pulm:  CTA B/L- no w/r/r GI: (+) PEG; soft, NT< ND, (+)BS- no nausea at this time Musculoskeletal:        General: No edema.     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    Psych:appropriate   Assessment/Plan: 1. 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  Physiatrist is providing close team supervision and 24 hour management of active medical problems listed below.  Physiatrist and rehab team continue to assess barriers to discharge/monitor patient progress toward functional and medical goals  Care Tool:  Bathing    Body parts bathed by patient: Right arm, Left arm, Chest, Abdomen, Face   Body parts bathed by helper: Front perineal area, Buttocks, Right upper leg, Left upper leg, Right lower leg, Left lower leg     Bathing assist Assist Level: Moderate Assistance - Patient 50 - 74%     Upper Body Dressing/Undressing Upper body dressing   What is the patient wearing?: Pull over shirt    Upper body assist Assist Level: Minimal Assistance - Patient > 75%    Lower Body Dressing/Undressing Lower body dressing      What is the patient wearing?: Pants     Lower body assist Assist for lower body dressing: Maximal Assistance - Patient 25 - 49%     Toileting Toileting Toileting Activity did not occur (Clothing management and hygiene only): N/A (no void or bm)(foley)  Toileting assist Assist for toileting: Dependent - Patient 0%     Transfers Chair/bed transfer  Transfers assist  Chair/bed transfer activity did not occur: Safety/medical concerns(limited by dizziness, chest pain)  Chair/bed transfer assist level: Minimal Assistance - Patient > 75% Chair/bed transfer assistive device: Programmer, multimedia   Ambulation assist   Ambulation activity did not occur: Safety/medical concerns(limited by dizziness, chest pain)  Assist level: Minimal Assistance - Patient > 75% Assistive device: No Device Max distance: 90'   Walk 10 feet activity   Assist  Walk 10 feet activity did not occur: Safety/medical concerns(limited by dizziness, chest pain)  Assist level: Minimal Assistance - Patient > 75% Assistive device: No Device   Walk 50 feet activity  Assist Walk 50 feet with 2 turns activity did not occur: Safety/medical concerns(limited by dizziness, chest pain)  Assist level: Minimal Assistance - Patient > 75% Assistive device: No Device    Walk 150 feet activity   Assist Walk 150 feet activity did not occur: Safety/medical concerns         Walk 10 feet on uneven surface  activity   Assist Walk 10 feet on uneven surfaces activity  did not occur: Safety/medical concerns(limited by dizziness, chest pain)         Wheelchair     Assist     Wheelchair activity did not occur: Safety/medical concerns(limited by dizziness, chest pain)         Wheelchair 50 feet with 2 turns activity    Assist    Wheelchair 50 feet with 2 turns activity did not occur: Safety/medical concerns(limited by dizziness, chest pain)       Wheelchair 150 feet activity     Assist  Wheelchair 150 feet activity did not occur: Safety/medical concerns(limited by dizziness, chest pain)       Blood pressure (!) 135/94, pulse 100, temperature 98.2 F (36.8 C), resp. rate 18, height 5\' 6"  (1.676 m), weight 74.4 kg, SpO2 99 %.  1.  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              -patient may shower             -ELOS/Goals: 2-3 weeks minA , Cont CIR  PT, OT, SLP 2.  Antithrombotics: -DVT/anticoagulation:  Pharmaceutical: Other (comment)--Eliquis             -antiplatelet therapy: N/A 3. Pain Management: Fentanyl IV prior to hydrotherapy. Will add oxycodone prn for sacral pain.   3/13- pain doing better- con't regimen for now.   3/14- R shoulder pain is doing better  4. Mood: LCSW to follow for evaluation and support.              -antipsychotic agents: N/A 5. Neuropsych: This patient appears to be capable of making decisions on his own behalf. 6. Sacral decubitus/Skin/Wound Care- Was unstageable- initially, covered with eshar initially- looks deep Stage III currently. : continue air mattress overlay.  Hydrotherapy 6 days a week. Tube feeds with additional protein supplement and vitamins to help promote wound healing.   3/13- have wet to dry on days not getting hydrotherapy.  3/17- plan to re-eval wound for treatment as of next week- cleaning up nicely, per my assessment and WOC  3/18- got pic of wound- a little slough and eschar left- but improving- con't wound  care/hydrotherapy for a least 1 week. 7. Fluids/Electrolytes/Nutrition: Monitor I/O. Check lytes in am and weekly.  8. T2DM: Hgb A1c- 11.7--continue Levemir bid with SSI every 4 hours for elevated BS. Will consult dietician to start bolus tube feeds.    CBG (last 3)  Recent Labs    04/02/19 1146 04/02/19 1713 04/02/19 1928  GLUCAP 170* 86 155*   CBGs with some lability    3/14- increase Levemir to 27 units BID - am CBG on low side will need snack at noc  3/16- BGs variable/labile- con't meds for now  3/17- still variable- will consult DM team  3/18- DM team added Novolog 3 units- will monitor  3/19- DM coordinator- decreased levemir to 25 units and SSI to sensitive 9. Dizziness: Will change Cardura to bedtime to avoid SE. Will monitor orthostatic Bps  3/14- BP on  soft side and slightly tachycardic, likely due to breahting meds and overall deconditioning- con't to monitor 10. 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   3/11- will add Duonebs QID- not prn-   3/12- improved SOB- con't regimen- discussed with resp therapy.  3/14- con't Duonebs-TID   3/17- will try and wean O2 to off if possible since pt's O2 sats 99-100% via TC- will consult resp for assistance.    12. Resting tachycardia: Due to deconditioning. No BB due to soft BP/orthostatic symptoms.  Monitor for now.    Vitals:   04/02/19 1615 04/02/19 1927  BP:  (!) 135/94  Pulse: 96 100  Resp: 18 18  Temp:  98.2 F (36.8 C)  SpO2: 95% 99%  mildly elevated likely due to deconditioning   3/16- pulse still in 100s- due to deconditioning- con't meds  3/17- BP elevated today- is rare- will con't to monitor-   3/19- Pulse slowly improving- is `95 at rest 13. Gangrenous changes bilateral feet: Will order off loading shoes. Continue to monitor.   Primarily affecting 1st and 2nd toes bilaterally - pedal and post tib pulses normal , likely small vessel infarcts   3/16- toes doing better- peels dry  skin and looks like healing somewhat.   3/17- slowly improving- peeling dry skin.  3/19- ordered healing post op shoes for pt to walk more 14. Abnormal LFTs: Recheck labs in am.   3/10- mild elevation of ALT only- will con't to monitor 15. ABLA: Due to critical illness. Will check anemia panel.   3/10- looking better- con't treatment 16. Right Vocal fold immobility, dysphonia:s/p transnasal fiberoptic laryngoscopy by ENT on 3/9 but was aborted due to gagging and vomiting. ENT will re-attempt in a couple of days.   3/11- could hear a few words using PMV this AM- will con't to monitor  Pt ok with ENT re attempting laryngoscopy   3/16- will call ENT to get scope done for pt.  3/17- waiting for ENT   3/18- ENT scoped pt- going to [place] next week- will arrange per ENT 17. Leukocytosis-resolved  17. Disposition: Patient lives with wife and 5 children aged 72-23 who will provide support upon discharge.    LOS: 10 days A FACE TO FACE EVALUATION WAS PERFORMED  Andrew Bruce 04/02/2019, 7:41 PM

## 2019-04-02 NOTE — Progress Notes (Addendum)
Physical Therapy Session Note  Patient Details  Name: Andrew Bruce MRN: [id number] Date of Birth: 01/20/1965  Today's Date: 04/02/2019 PT Individual Time: 4098-1191 PT Individual Time Calculation (min): 35 min   Short Term Goals: Week 2:  PT Short Term Goal 1 (Week 2): Patient will consistently perform all bed mobility with supervision without use of bed rails. PT Short Term Goal 2 (Week 2): Patient will perform basic transfers with close supervision using LRAD. PT Short Term Goal 3 (Week 2): Patient will ambulate >25 ft using LRAD. PT Short Term Goal 4 (Week 2): Patient will tolerate sitting OOB >1 hour.  Skilled Therapeutic Interventions/Progress Updates:   Pt in supine and agreeable to therapy, denies pain but requesting to use bathroom. Spanish interpreter present throughout session. Donned non-skid socks w/ total assist and pt requesting to not put darco or standard shoes on 2/2 discomfort. Supervision bed mobility and sit>stand to RW w/ CGA. Ambulated to/from toilet in bathroom w/ min assist using RW. Min assist toilet transfer and pt continent of bowel. Needed total assist for pericare. Once back out of bathroom, pt agreeable to leave room for session. Total assist w/c transport to/from therapy gym. Ambulated 90' w/ min assist w/o AD, verbal reminders to keep weight on heels. Pt w/ decreased hip and knee flexion, creating excessive trunk leans w/ each step, but not unsafe. Seated rest break in w/c, verbal cues for breathing strategies but pt denied SOB. Attempted to perform another gait bout when pt stated he needed to urgently use toilet again. Returned to room and ambulated to/from toilet w/o AD, min assist for toilet transfer and total assist for pericare again. Once back in w/c, pt w/ increased fatigue, requested to return to supine. Supervision bed mobility. Pt back on wall O2 and on continuous pulse ox monitor. All needs in reach. Missed 25 min of skilled PT 2/2 fatigue.   Therapy  Documentation Precautions:  Precautions Precautions: Fall, Other (comment) Precaution Comments: PMSV at all times, PEG;  NPO Restrictions Weight Bearing Restrictions: No Other Position/Activity Restrictions: HOB elevated 30 degrees at all times Vital Signs: Therapy Vitals Temp: 98.3 F (36.8 C) Temp Source: Oral Pulse Rate: (!) 103 Resp: 18 BP: 131/84 Patient Position (if appropriate): Lying Oxygen Therapy SpO2: 94 % O2 Device: Tracheostomy Collar O2 Flow Rate (L/min): 5 L/min FiO2 (%): 28 %  Therapy/Group: Individual Therapy  Andrew Bruce [name] 04/02/2019, 9:54 AM

## 2019-04-02 NOTE — Progress Notes (Signed)
Orthostatic recorder with positive symptoms when patient stood became dizzy, and flushed. no nausea denies pain, assisted back to bed, monitor

## 2019-04-02 NOTE — Progress Notes (Signed)
Nutrition Follow-up  DOCUMENTATION CODES:   Not applicable  INTERVENTION:  Change tube feeding regimen to: -254m Osmolite 1.5 cal (1 carton) 5 times daily -330mPro-stat QID -Free water per MD/PA, currently 10022mID --1 packet Juven BID  Tube feeding regimen will provide: 2365 kcal, 139 grams of protein, 1305m34mee water   NUTRITION DIAGNOSIS:   Inadequate oral intake related to inability to eat as evidenced by NPO status.  Ongoing.  GOAL:   Patient will meet greater than or equal to 90% of their needs  Met with tube feeding.  MONITOR:   PO intake, TF tolerance, Skin, Weight trends, Labs, I & O's  REASON FOR ASSESSMENT:   Consult Enteral/tube feeding initiation and management  ASSESSMENT:   Pt was originally admitted on 02/06/19 after 10 day history of Covid 19 and progressive shortness of breath x 48  Hours. Pt admitted to CIR on 03/23/19.  1/23 Admit 1/24 Intubated 1/25 B/L chest tubes placed 2/02 CT head: multiple ischemic infarcts  2/16 S/P tracheostomy and Cortrak placement (tip in the stomach) 2/24 S/P PEG 2/25 TF being resumed  RN called RD to ask about changing tube feeding regimen due to pt complaining of "feeling full" and vomiting x1. Pt reports no ongoing abdominal pain or feelings of fullness to RD, only endorsed abdominal pain this morning which was resolved with bowel movement.   Current tube feeding regimen: 355ml61m5 cartons) Osmolite 1.5 cal QID, 30ml 14mstat TID, 100ml f54mwater QID  Medications reviewed and include: Pepcid, Vitamin C, SSI, Novolog, Levemir, Juven BID, Senokot-S, Zinc sulfate, MVI  Labs reviewed. CBGs 56-291  UOP: 1,075ml x258murs I/O: -10,830ml sin72mdmit  Diet Order:   Diet Order            Diet NPO time specified  Diet effective now              EDUCATION NEEDS:   No education needs have been identified at this time  Skin:  Skin Assessment: Skin Integrity Issues: Skin Integrity Issues::  Unstageable, Other (Comment) Unstageable: coccyx Other: necrotic toes  Last BM:  3/19  Height:   Ht Readings from Last 1 Encounters:  03/24/19 5' 6"  (1.676 m)    Weight:   Wt Readings from Last 1 Encounters:  03/17/19 69.8 kg    BMI:  Body mass index is 26.47 kg/m.  Estimated Nutritional Needs:   Kcal:  [id number]:  120-145 grams  Fluid:  >/=2L/d   Andrew Bruce AvLarkin Bruce LDN RD pager number and weekend/on-call pager number located in [email or web address]

## 2019-04-02 NOTE — Accreditation Note (Signed)
Inpatient Diabetes Program Recommendations  AACE/ADA: New Consensus Statement on Inpatient Glycemic Control (2015)  Target Ranges:  Prepandial:   less than 140 mg/dL      Peak postprandial:   less than 180 mg/dL (1-2 hours)      Critically ill patients:  140 - 180 mg/dL   Lab Results  Component Value Date   GLUCAP 291 (H) 04/01/2019   HGBA1C 11.7 (H) 02/17/2019    Review of Glycemic Control Results for CAYDEN, GRANHOLM (MRN [id number]) as of 04/02/2019 10:41  Ref. Range 03/31/2019 06:17 03/31/2019 12:12 03/31/2019 16:59 03/31/2019 20:40 03/31/2019 22:55  Glucose-Capillary Latest Ref Range: 70 - 99 mg/dL 80     Levemir 27 units 263 (H)  Novolog 5 units + 3 meal coverage 99 218 (H)      Levemir 27 units 126 (H)   Results for DAVYON, FISCH (MRN [id number]) as of 04/02/2019 10:41  Ref. Range 04/01/2019 06:23 04/01/2019 11:55 04/01/2019 16:28 04/01/2019 20:12 04/01/2019 20:43 04/01/2019 22:43  Glucose-Capillary Latest Ref Range: 70 - 99 mg/dL 88     Levemir 27 units 76  Novolog 3 units 197 (H)  Novolog 2 units 56 (L) 138 (H) 291 (H)  Novolog 3 units    Current orders for Inpatient glycemic control:  Levemir 27 units bid Novolog 0-9 units tid + hs  Osmolite bolus feeds 4x/day (0800, 1200, 1600, 2000)  Inpatient Diabetes Program Recommendations:    Fasting glucose 98 this am.  Did not get Levemir 27 units last night due to hypo, only received am dose. Glucose patterns not consistent. May be on too much basal insulin, yet sensitive to Novolog doses at times.  - Decrease Levemir to 25 units bid  - Decrease Novolog scale to "very sensitive scale" 0-6 units change frequency Q4 hours.   Thanks,  Christena Deem RN, MSN, BC-ADM Inpatient Diabetes Coordinator Team Pager [phone] (8a-5p)

## 2019-04-02 NOTE — Progress Notes (Signed)
Physical Therapy Wound Treatment and Discharge Patient Details  Name: Andrew Bruce MRN: [id number] Date of Birth: 15-Oct-1965  Today's Date: 04/02/2019 PT Individual Time: 4975-3005 PT Individual Time Calculation (min): 30 min   Subjective  Subjective: Pt supine in bed on arrival, agreeable to session.   Patient and Family Stated Goals: None stated Prior Treatments: treatment on acute  Pain Score:  Premedicated  Wound Assessment  Pressure Injury 02/22/19 Coccyx Medial Unstageable - Full thickness tissue loss in which the base of the injury is covered by slough (yellow, tan, gray, green or brown) and/or eschar (tan, brown or black) in the wound bed. quarter-sized/purple  (Active)  Dressing Type Foam - Lift dressing to assess site every shift;Barrier Film (skin prep);Moist to dry 04/02/19 1307  Dressing Changed 04/02/19 1307  Dressing Change Frequency Daily 04/02/19 1307  State of Healing Other (Comment) 04/02/19 1307  Site / Wound Assessment Yellow;Pink 04/02/19 1307  % Wound base Red or Granulating 70% 04/02/19 1307  % Wound base Yellow/Fibrinous Exudate 30% 04/02/19 1307  % Wound base Black/Eschar 0% 04/02/19 1307  % Wound base Other/Granulation Tissue (Comment) 0% 04/02/19 1307  Peri-wound Assessment Intact 04/02/19 1307  Wound Length (cm) 5 cm 04/01/19 1035  Wound Width (cm) 2 cm 04/01/19 1035  Wound Depth (cm) 1.5 cm 04/01/19 1035  Wound Surface Area (cm^2) 10 cm^2 04/01/19 1035  Wound Volume (cm^3) 15 cm^3 04/01/19 1035  Tunneling (cm) 0 03/23/19 2122  Undermining (cm) 0 03/23/19 2122  Margins Unattached edges (unapproximated) 04/02/19 1307  Drainage Amount Minimal 04/02/19 1307  Drainage Description Sanguineous;No odor;Serous;Serosanguineous 04/02/19 1307  Treatment Debridement (Selective);Hydrotherapy (Pulse lavage);Packing (Saline gauze) 04/02/19 1307   Santyl applied to wound bed prior to applying dressing.    Hydrotherapy Pulsed lavage therapy - wound location:  Sacrum Pulsed Lavage with Suction (psi): 12 psi(4-12) Pulsed Lavage with Suction - Normal Saline Used: 1000 mL Pulsed Lavage Tip: Tip with splash shield Selective Debridement Selective Debridement - Location: Sacrum Selective Debridement - Tools Used: Forceps;Scalpel Selective Debridement - Tissue Removed: Thinning, tenacious, yellow eschar   Wound Assessment and Plan  Wound Therapy - Assess/Plan/Recommendations Wound Therapy - Clinical Statement: Wound presents with adherent and thin yellow eschar and expanding partial areas of pink granulation. Unable to debride without bleeding. Pt no longer in need of hydrotherapy and will benefit from continuation of Santyl to eliminate necrosis. Wound Therapy - Functional Problem List: Improving weakness s/p COVID, CVA's, aortic clot Factors Delaying/Impairing Wound Healing: Multiple medical problems;Immobility Hydrotherapy Plan: Debridement;Dressing change;Patient/family education;Pulsatile lavage with suction Wound Therapy - Frequency: 6X / week Wound Therapy - Follow Up Recommendations: Home health RN Wound Plan: See above  Wound Therapy Goals- Improve the function of patient's integumentary system by progressing the wound(s) through the phases of wound healing (inflammation - proliferation - remodeling) by: Decrease Necrotic Tissue to: 10 Decrease Necrotic Tissue - Progress: Partly met Increase Granulation Tissue to: 90 Increase Granulation Tissue - Progress: Partly met Goals/treatment plan/discharge plan were made with and agreed upon by patient/family: Yes Time For Goal Achievement: 7 days Wound Therapy - Potential for Goals: Good  Goals will be updated until maximal potential achieved or discharge criteria met.  Discharge criteria: when goals achieved, discharge from hospital, MD decision/surgical intervention, no progress towards goals, refusal/missing three consecutive treatments without notification or medical reason.  GP        Wyona Almas, PT, DPT Acute Rehabilitation Services Pager [phone] Office [phone]   Deno Etienne 04/02/2019, 1:13 PM

## 2019-04-02 NOTE — Progress Notes (Signed)
Occupational Therapy Weekly Progress Note  Patient Details  Name: Andrew Bruce MRN: [id number] Date of Birth: 10-30-1965  Beginning of progress report period: March 24, 2019 End of progress report period: April 02, 2019  Today's Date: 04/02/2019 OT Individual Time: 1100-1200 OT Individual Time Calculation (min): 60 min    Patient has met 4 of 4 short term goals.  Pt has made excellent progress this last progress period. Pt can now transfer with min A to w/c, bed, B toilet, BSC and has begun walking with and without AD with min A. Pt now has post op shoes to continue to provide support but not put pressure on his toes. Pt was able to shower today for the first time. Pt continues to have weakness in right UE/ hand with some shoulder pain but continues to improve. Family has not been present for sessions.   Patient continues to demonstrate the following deficits: muscle weakness, decreased cardiorespiratoy endurance, impaired timing and sequencing, unbalanced muscle activation and decreased coordination, decreased problem solving, decreased memory and delayed processing and decreased standing balance and decreased balance strategies and therefore will continue to benefit from skilled OT intervention to enhance overall performance with BADL and Reduce care partner burden.  Patient progressing toward long term goals..  Continue plan of care.  OT Short Term Goals Week 1:  OT Short Term Goal 1 (Week 1): Pt will tolerate out of bed for 2-3 hrs a day OT Short Term Goal 1 - Progress (Week 1): Met OT Short Term Goal 2 (Week 1): Pt will perform UB dressing wth mod A OT Short Term Goal 2 - Progress (Week 1): Met OT Short Term Goal 3 (Week 1): Pt will dress LB clothing (brief/ pants) with max A OT Short Term Goal 3 - Progress (Week 1): Met OT Short Term Goal 4 (Week 1): Pt will transfer to toilet with max A +1 OT Short Term Goal 4 - Progress (Week 1): Met Week 2:  OT Short Term Goal 1 (Week 2): Pt  will perform 3/3 toileting tasks wtih min  A OT Short Term Goal 2 (Week 2): Pt will perform bed mobility in prep for ADL with supervision OT Short Term Goal 3 (Week 2): Pt will stand to perform one grooming task at the sink with min A OT Short Term Goal 4 (Week 2): Pt will don LB clothing with min A  Skilled Therapeutic Interventions/Progress Updates:    1:1 Pt was in bed when arrived. Pt came to EOB and able to doff shirt with min A. Ambulated to the bathroom with min A without AD. Pt sat on tub bench to doff LB clothing with max. Pt participated in showering today with trach protector in place from nipple line down!!!! Pt able to bathe with min A with grab bar sit to stand. Pt ambulated over to the 3:1 with lid closed to dress. Pt able to don shirt with setup with adjustment made over trach. Pt able to thread regular underwear and pants with min A and stand to pull them up. A with donning socks back on. Ambulated back out to the bed with RW with min A with more ease with RW. Pt was able to tolerate room air with PMSV donned maintaining his O2 sats at low to mid 90s. HR elevated around 100-118. Returned to EOB for washing hair and face. REturned to supine for nursing to change bandage on bottom. Pt very tearful and excited about his progress today. Trach collar donned back  on at rest.   Therapy Documentation Precautions:  Precautions Precautions: Fall, Other (comment) Precaution Comments: PMSV at all times, PEG;  NPO Restrictions Weight Bearing Restrictions: No Other Position/Activity Restrictions: HOB elevated 30 degrees at all times Vital Signs: Therapy Vitals Pulse Rate: (!) 103 Resp: 18 Patient Position (if appropriate): Lying Oxygen Therapy SpO2: 94 % O2 Device: Tracheostomy Collar O2 Flow Rate (L/min): 5 L/min FiO2 (%): 28 % Pain:  no c/o pain in session   Therapy/Group: Individual Therapy  [name] [facility] 04/02/2019, 12:54 PM

## 2019-04-02 NOTE — Progress Notes (Addendum)
Late entry: pt refusing bolus feedings due to "feeling full" and experiencing N/V. Pt refused am feeding and due to drop in blood sugar pt agreed to one bolus feed of  and agreed to bolus of at lunch as well. After therapy pt c/o of nausea, administered IV zofran with good results. P. Love PA-c requested I contact dietician to see if can make changes due to volumes being administered. Talked to [facility] (Deitician) sd would review and make changes, possibly changing the bolus feed.  Changes made, discussed with P.Love, additional changes made. Pt was able to tolerate feeding, medications, and water flush.

## 2019-04-03 ENCOUNTER — Inpatient Hospital Stay (HOSPITAL_COMMUNITY): Payer: Self-pay | Admitting: Speech Pathology

## 2019-04-03 LAB — GLUCOSE, CAPILLARY
Glucose-Capillary: 53 mg/dL — ABNORMAL LOW (ref 70–99)
Glucose-Capillary: 115 mg/dL — ABNORMAL HIGH (ref 70–99)
Glucose-Capillary: 204 mg/dL — ABNORMAL HIGH (ref 70–99)
Glucose-Capillary: 221 mg/dL — ABNORMAL HIGH (ref 70–99)

## 2019-04-03 MED ORDER — INSULIN DETEMIR 100 UNIT/ML ~~LOC~~ SOLN
25.0000 [IU] | Freq: Two times a day (BID) | SUBCUTANEOUS | Status: DC
  Administered 2019-04-03 – 2019-04-05 (×4): 25 [IU] via SUBCUTANEOUS
  Filled 2019-04-03 (×6): qty 0.25

## 2019-04-03 NOTE — Progress Notes (Signed)
Speech Language Pathology Daily Session Note  Patient Details  Name: Andrew Bruce MRN: [id number] Date of Birth: 26-Dec-1965  Today's Date: 04/03/2019 SLP Individual Time: 0905-1000 SLP Individual Time Calculation (min): 55 min  Short Term Goals: Week 2: SLP Short Term Goal 1 (Week 2): Pt will complete 5 repetitions with EMST 150 device set at 30 cmH20 and IMST device set at 10 cmH20  with mod A verbal instruction. SLP Short Term Goal 2 (Week 2): Pt will demonstrate donning and doffing of PMSV with mod A multimodal cues SLP Short Term Goal 3 (Week 2): Pt will demonstrate knowledge of vocal cord function/impairment by answering yes/no questions accuracy in 7 out of 10 opportunity with Min A cues. SLP Short Term Goal 4 (Week 2): Pt will communicate basic wants and needs with supervision level questions for verification of message. SLP Short Term Goal 5 (Week 2): Pt will consume ice chips at bedside to rehabilitate musculature and to increase ROM.  Skilled Therapeutic Interventions:  Pt was seen for skilled ST targeting goals for communication and dysphagia.  Pt was unable to donn or doff PMSV using two hands due to numbness and weakness in his right hand but he was able to doff valve using one hand if valve was placed lightly over trach.  SLP discussed with pt that he should still ask for assistance with donning and doffing of valve until his dexterity and sensation improves.  No appreciable changes in vital signs were noted with placement of valve for the duration of today's therapy session.  Pt completed 25 repetitions of IMST with supervision verbal cues although pt was noted to have increased fatigue as repetitions increased.  Pt had increased difficulty completing EMST repetitions so resistance was decreased from 20 cm H2O to 15 cm H2O which improved the accuracy/effectiveness of his reps.  Pt consumed ~5 trials of ice chips with no overt s/s of aspiration.  He was able to explain that the  nature of his vocal dysfunction was related to his "breathing tube."  Pt was left in bed with nursing at bedside.  Continue per current plan of care.    Pain Pain Assessment Pain Scale: 0-10 Pain Score: 0-No pain  Therapy/Group: Individual Therapy  Sydne Krahl, Melanee Spry 04/03/2019, 10:16 AM

## 2019-04-03 NOTE — Progress Notes (Signed)
Patient resting throughout shift and easily aroused without acute distress or discomfort, able to make his needs known to staff. Trach secured and suction provided, remains on 5 L/28% -trach collar., Respiration unlabored, remains NPO, G-Tube intact with bolus feeding and tolerated well, no c/o nausea. Continue medical regime,call bell in place.refer to assessment data sheet for additional information.

## 2019-04-03 NOTE — Progress Notes (Signed)
Administered Benadryl 12.5 mg for c/o itching -upper body area

## 2019-04-03 NOTE — Progress Notes (Signed)
[place] PHYSICAL MEDICINE & REHABILITATION PROGRESS NOTE   Subjective/Complaints:   No breathing issues this am, place PMV and pt was breathing without distress, removed when author left room  ROS:  Pt denies SOB, abd pain, CP, N/V/C/D, and vision changes     Objective:   No results found. No results for input(s): WBC, HGB, HCT, PLT in the last 72 hours. No results for input(s): NA, K, CL, CO2, GLUCOSE, BUN, CREATININE, CALCIUM in the last 72 hours.  Intake/Output Summary (Last 24 hours) at 04/03/2019 0859 Last data filed at 04/03/2019 0250 Gross per 24 hour  Intake 0 ml  Output 500 ml  Net -500 ml     Physical Exam: Vital Signs Blood pressure 131/90, pulse 96, temperature 98 F (36.7 C), resp. rate 17, height 5\' 6"  (1.676 m), weight 72.6 kg, SpO2 99 %.  Nursing note and vitals reviewed.  General: pt awake, alert, appropriate, laying supine in bed; NAD CV: borderline tachycardia at 95-100- regular rhythm Pulm:  CTA B/L- no w/r/r GI: (+) PEG; soft, NT< ND, (+)BS- no nausea at this time Musculoskeletal:        General: No edema.     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    Psych:appropriate   Assessment/Plan: 1. 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  Physiatrist is providing close team supervision and 24 hour management of active medical problems listed below.  Physiatrist and rehab team continue to assess barriers to discharge/monitor patient progress toward functional and medical goals  Care Tool:  Bathing    Body parts bathed by patient: Right arm, Left arm, Chest, Abdomen, Face   Body parts bathed by helper: Front perineal area, Buttocks, Right upper leg, Left upper leg, Right lower  leg, Left lower leg     Bathing assist Assist Level: Moderate Assistance - Patient 50 - 74%     Upper Body Dressing/Undressing Upper body dressing   What is the patient wearing?: Pull over shirt    Upper body assist Assist Level: Minimal Assistance - Patient > 75%    Lower Body Dressing/Undressing Lower body dressing      What is the patient wearing?: Pants     Lower body assist Assist for lower body dressing: Maximal Assistance - Patient 25 - 49%     Toileting Toileting Toileting Activity did not occur (Clothing management and hygiene only): N/A (no void or bm)(foley)  Toileting assist Assist for toileting: Dependent - Patient 0%     Transfers Chair/bed transfer  Transfers assist  Chair/bed transfer activity did not occur: Safety/medical concerns(limited by dizziness, chest pain)  Chair/bed transfer assist level: Minimal Assistance - Patient > 75% Chair/bed transfer assistive device:   Ambulation assist   Ambulation activity did not occur: Safety/medical concerns(limited by dizziness, chest pain)  Assist level: Minimal Assistance - Patient > 75% Assistive device: No Device Max distance: 90'   Walk 10 feet activity   Assist  Walk 10 feet activity did not occur: Safety/medical concerns(limited by dizziness, chest pain)  Assist level: Minimal Assistance - Patient > 75% Assistive device: No Device   Walk 50 feet activity   Assist Walk 50 feet with 2 turns activity did not occur: Safety/medical concerns(limited by dizziness, chest pain)  Assist level: Minimal Assistance - Patient > 75% Assistive device: No Device    Walk 150 feet activity   Assist Walk 150 feet activity did not occur: Safety/medical concerns         Walk 10 feet on uneven surface  activity   Assist Walk 10 feet on uneven surfaces activity did not occur: Safety/medical concerns(limited by dizziness, chest pain)          Wheelchair     Assist     Wheelchair activity did not occur: Safety/medical concerns(limited by dizziness, chest pain)         Wheelchair 50 feet with 2 turns activity    Assist    Wheelchair 50 feet with 2 turns activity did not occur: Safety/medical concerns(limited by dizziness, chest pain)       Wheelchair 150 feet activity     Assist  Wheelchair 150 feet activity did not occur: Safety/medical concerns(limited by dizziness, chest pain)       Blood pressure 131/90, pulse 96, temperature 98 F (36.7 C), resp. rate 17, height 5\' 6"  (1.676 m), weight 72.6 kg, SpO2 99 %.  1.  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              -patient may shower             -ELOS/Goals: 2-3 weeks minA , Cont CIR  PT, OT, SLP 2.  Antithrombotics: -DVT/anticoagulation:  Pharmaceutical: Other (comment)--Eliquis             -antiplatelet therapy: N/A 3. Pain Management: Fentanyl IV prior to hydrotherapy. Will add oxycodone prn for sacral pain.   3/13- pain doing better- con't regimen for now.   3/14- R shoulder pain is doing better  4. Mood: LCSW to follow for evaluation and support.              -antipsychotic agents: N/A 5. Neuropsych: This patient appears to be capable of making decisions on his own behalf. 6. Sacral decubitus/Skin/Wound Care- Was unstageable- initially, covered with eshar initially- looks deep Stage III currently. : continue air mattress overlay.  Hydrotherapy 6 days a week. Tube feeds with additional protein supplement and vitamins to help promote wound healing.   3/13- have wet to dry on days not getting hydrotherapy.  3/17- plan to re-eval wound for treatment as of next week- cleaning up nicely, per my assessment and WOC  3/18- got pic of wound- a little slough and eschar left- but improving- con't wound care/hydrotherapy for a least 1 week. 7. Fluids/Electrolytes/Nutrition: Monitor I/O. Check lytes in am  and weekly.  8. T2DM: Hgb A1c- 11.7--continue Levemir bid with SSI every 4 hours for elevated BS. Will consult dietician to start bolus tube feeds.    CBG (last 3)  Recent Labs    04/02/19 2152 04/03/19 0628 04/03/19 0652  GLUCAP 124* 53* 115*   CBGs with some lability      3/18- DM team added Novolog 3 units- will monitor  3/19- DM coordinator- decreased levemir to 25 units on 3/20 and SSI to sensitive 9. Dizziness: Will change Cardura to bedtime to avoid SE. Will monitor orthostatic Bps  3/14- BP on soft side and slightly tachycardic, likely due to breahting meds and overall deconditioning- con't to monitor 10. 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   3/11- will add Duonebs QID- not prn-   3/12- improved SOB- con't regimen- discussed with resp  therapy.  3/14- con't Duonebs-TID   3/17- will try and wean O2 to off if possible since pt's O2 sats 99-100% via TC- will consult resp for assistance.    12. Resting tachycardia: Due to deconditioning. No BB due to soft BP/orthostatic symptoms.  Monitor for now.    Vitals:   04/03/19 0425 04/03/19 0532  BP:  131/90  Pulse: 99 96  Resp: 18 17  Temp:  98 F (36.7 C)  SpO2: 97% 99%  mild  Diastolic elevation  13. Gangrenous changes bilateral feet: Will order off loading shoes. Continue to monitor.   Primarily affecting 1st and 2nd toes bilaterally - pedal and post tib pulses normal , likely small vessel infarcts   3/16- toes doing better- peels dry skin and looks like healing somewhat.   3/17- slowly improving- peeling dry skin.  3/19- ordered healing post op shoes for pt to walk more 14. Abnormal LFTs: Recheck labs in am.   3/10- mild elevation of ALT only- will con't to monitor 15. ABLA: Due to critical illness. Will check anemia panel.   3/10- looking better- con't treatment 16. Right Vocal fold immobility, dysphonia:s/p transnasal fiberoptic laryngoscopy by ENT on 3/9 but was aborted due to  gagging and vomiting. ENT will re-attempt in a couple of days.   3/11- could hear a few words using PMV this AM- will con't to monitor  Pt ok with ENT re attempting laryngoscopy   3/16- will call ENT to get scope done for pt.  3/17- waiting for ENT   3/18- ENT scoped pt- [place]- will arrange per ENT   18. Disposition: Patient lives with wife and 5 children aged 34-23 who will provide support upon discharge.    LOS: 11 days A FACE TO FACE EVALUATION WAS PERFORMED  Erick Colace 04/03/2019, 8:59 AM

## 2019-04-03 NOTE — Progress Notes (Addendum)
Hypoglycemic Event  CBG:- 53  Treatment: 8 oz juice/soda/ Osmolite Bolus as schedule as ordered adminiistered  Symptoms: None noted  Follow-up CBG: Time:0650 CBG Result- 115: Possible Reasons for Event:  Unknown  Comments/MD notified:will notify and inform oncoming nurse ( 7am)  0725 AM  Dr. Doroteo Bradford rounding and informed of this occurrence, no new orders at this time  Luetta Nutting

## 2019-04-04 LAB — GLUCOSE, CAPILLARY
Glucose-Capillary: 138 mg/dL — ABNORMAL HIGH (ref 70–99)
Glucose-Capillary: 162 mg/dL — ABNORMAL HIGH (ref 70–99)
Glucose-Capillary: 97 mg/dL (ref 70–99)
Glucose-Capillary: 189 mg/dL — ABNORMAL HIGH (ref 70–99)
Glucose-Capillary: 119 mg/dL — ABNORMAL HIGH (ref 70–99)

## 2019-04-04 NOTE — Plan of Care (Signed)
  Problem: Consults Goal: RH GENERAL PATIENT EDUCATION Description: See Patient Education module for education specifics. Outcome: Progressing Goal: Skin Care Protocol Initiated - if Braden Score 18 or less Description: If consults are not indicated, leave blank or document N/A Outcome: Progressing Goal: Nutrition Consult-if indicated Outcome: Progressing Goal: Diabetes Guidelines if Diabetic/Glucose > 140 Description: If diabetic or lab glucose is > 140 mg/dl - Initiate Diabetes/Hyperglycemia Guidelines & Document Interventions  Outcome: Progressing   Problem: RH BOWEL ELIMINATION Goal: RH STG MANAGE BOWEL WITH ASSISTANCE Description: STG Manage Bowel with mod I Assistance. Outcome: Progressing   Problem: RH BLADDER ELIMINATION Goal: RH STG MANAGE BLADDER WITH ASSISTANCE Description: STG Manage Bladder With mod I Assistance Outcome: Progressing   Problem: RH SKIN INTEGRITY Goal: RH STG SKIN FREE OF INFECTION/BREAKDOWN Description: No new areas of break down while on CIR Outcome: Progressing Goal: RH STG MAINTAIN SKIN INTEGRITY WITH ASSISTANCE Description: STG Maintain Skin Integrity With mod I Assistance. Outcome: Progressing   Problem: RH SAFETY Goal: RH STG ADHERE TO SAFETY PRECAUTIONS W/ASSISTANCE/DEVICE Description: STG Adhere to Safety Precautions With mod I Assistance/Device. Outcome: Progressing   Problem: RH PAIN MANAGEMENT Goal: RH STG PAIN MANAGED AT OR BELOW PT'S PAIN GOAL Description: Pain goal of <3/10 Outcome: Progressing

## 2019-04-04 NOTE — Progress Notes (Signed)
[place] PHYSICAL MEDICINE & REHABILITATION PROGRESS NOTE   Subjective/Complaints:   No brathing issues, some toe pain requiring analgesics No resp distress with PMV on , this was on upon entry to room  ROS:  Pt denies SOB, abd pain, CP, N/V/C/D, and vision changes     Objective:   No results found. No results for input(s): WBC, HGB, HCT, PLT in the last 72 hours. No results for input(s): NA, K, CL, CO2, GLUCOSE, BUN, CREATININE, CALCIUM in the last 72 hours.  Intake/Output Summary (Last 24 hours) at 04/04/2019 0942 Last data filed at 04/04/2019 0120 Gross per 24 hour  Intake --  Output 1100 ml  Net -1100 ml     Physical Exam: Vital Signs Blood pressure 115/80, pulse (!) 106, temperature 99.5 F (37.5 C), resp. rate 18, height 5\' 6"  (1.676 m), weight 73 kg, SpO2 96 %.  Nursing note and vitals reviewed.   General: No acute distress Mood and affect are appropriate Heart: Regular rate and rhythm no rubs murmurs or extra sounds Lungs: Clear to auscultation, breathing unlabored, no rales or wheezes Abdomen: Positive bowel sounds, soft nontender to palpation, nondistended Extremities: Gangrenous 1st and 2nd toes bilaterally nore extensive on the left     Motor 4- BUE and 3- B HF, 4- Knee ext , 3- ADF , PF Psych:appropriate   Assessment/Plan: 1. 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  Physiatrist is providing close team supervision and 24 hour management of active medical problems listed below.  Physiatrist and rehab team continue to assess barriers to discharge/monitor patient progress toward functional and medical goals  Care Tool:  Bathing    Body parts bathed by patient: Right arm, Left arm, Chest, Abdomen, Face   Body parts bathed by helper: Front perineal area, Buttocks, Right upper leg, Left upper leg,  Right lower leg, Left lower leg     Bathing assist Assist Level: Moderate Assistance - Patient 50 - 74%     Upper Body Dressing/Undressing Upper body dressing   What is the patient wearing?: Pull over shirt    Upper body assist Assist Level: Minimal Assistance - Patient > 75%    Lower Body Dressing/Undressing Lower body dressing      What is the patient wearing?: Pants     Lower body assist Assist for lower body dressing: Maximal Assistance - Patient 25 - 49%     Toileting Toileting Toileting Activity did not occur (Clothing management and hygiene only): N/A (no void or bm)(foley)  Toileting assist Assist for toileting: Dependent - Patient 0%     Transfers Chair/bed transfer  Transfers assist  Chair/bed transfer activity did not occur: Safety/medical concerns(limited by dizziness, chest pain)  Chair/bed transfer assist level: Minimal Assistance - Patient > 75% Chair/bed transfer assistive device:   Ambulation assist   Ambulation activity did not occur: Safety/medical concerns(limited by dizziness, chest pain)  Assist level: Minimal Assistance - Patient > 75% Assistive device: No Device Max distance: 90'   Walk 10 feet activity   Assist  Walk 10 feet activity did not occur: Safety/medical concerns(limited by dizziness, chest pain)  Assist level: Minimal Assistance - Patient > 75% Assistive device: No Device   Walk 50 feet activity   Assist Walk 50 feet with 2 turns activity did not occur: Safety/medical concerns(limited by dizziness, chest pain)  Assist level: Minimal  Assistance - Patient > 75% Assistive device: No Device    Walk 150 feet activity   Assist Walk 150 feet activity did not occur: Safety/medical concerns         Walk 10 feet on uneven surface  activity   Assist Walk 10 feet on uneven surfaces activity did not occur: Safety/medical concerns(limited by dizziness, chest pain)          Wheelchair     Assist     Wheelchair activity did not occur: Safety/medical concerns(limited by dizziness, chest pain)         Wheelchair 50 feet with 2 turns activity    Assist    Wheelchair 50 feet with 2 turns activity did not occur: Safety/medical concerns(limited by dizziness, chest pain)       Wheelchair 150 feet activity     Assist  Wheelchair 150 feet activity did not occur: Safety/medical concerns(limited by dizziness, chest pain)       Blood pressure 115/80, pulse (!) 106, temperature 99.5 F (37.5 C), resp. rate 18, height 5\' 6"  (1.676 m), weight 73 kg, SpO2 96 %.  1.  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              -patient may shower             -ELOS/Goals: 2-3 weeks minA , Cont CIR  PT, OT, SLP 2.  Antithrombotics: -DVT/anticoagulation:  Pharmaceutical: Other (comment)--Eliquis             -antiplatelet therapy: N/A 3. Pain Management: Fentanyl IV prior to hydrotherapy. Will add oxycodone prn for sacral pain.   3/13- pain doing better- con't regimen for now.   3/14- R shoulder pain is doing better  4. Mood: LCSW to follow for evaluation and support.              -antipsychotic agents: N/A 5. Neuropsych: This patient appears to be capable of making decisions on his own behalf. 6. Sacral decubitus/Skin/Wound Care- Was unstageable- initially, covered with eshar initially- looks deep Stage III currently. : continue air mattress overlay.  Hydrotherapy 6 days a week. Tube feeds with additional protein supplement and vitamins to help promote wound healing.   3/13- have wet to dry on days not getting hydrotherapy.  3/17- plan to re-eval wound for treatment as of next week- cleaning up nicely, per my assessment and WOC  3/18- got pic of wound- a little slough and eschar left- but improving- con't wound care/hydrotherapy for a least 1 week. 7. Fluids/Electrolytes/Nutrition: Monitor I/O. Check lytes in  am and weekly.  8. T2DM: Hgb A1c- 11.7--continue Levemir bid with SSI every 4 hours for elevated BS. Will consult dietician to start bolus tube feeds.    CBG (last 3)  Recent Labs    04/03/19 1142 04/03/19 2039 04/04/19 0616  GLUCAP 204* 221* 138*   Good am control will cont to  monitor prior to additional changes     3/18- DM team added Novolog 3 units- will monitor  3/19- DM coordinator- decreased levemir to 25 units on 3/20 and SSI to sensitive 9. Dizziness: Will change Cardura to bedtime to avoid SE. Will monitor orthostatic Bps  3/14- BP on soft side and slightly tachycardic, likely due to breahting meds and overall deconditioning- con't to monitor 10. 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   3/11- will add Duonebs QID- not prn-   3/12- improved SOB-  con't regimen- discussed with resp therapy.  3/14- con't Duonebs-TID   3/17- will try and wean O2 to off if possible since pt's O2 sats 99-100% via TC- will consult resp for assistance.    12. Resting tachycardia: Due to deconditioning. No BB due to soft BP/orthostatic symptoms.  Monitor for now.    Vitals:   04/04/19 0611 04/04/19 0739  BP:    Pulse:    Resp:    Temp:    SpO2: 98% 96%  mild  Diastolic elevation  13. Gangrenous changes bilateral feet: Will order off loading shoes. Continue to monitor.   Primarily affecting 1st and 2nd toes bilaterally - pedal and post tib pulses normal , likely small vessel infarcts   3/16- toes doing better- peels dry skin and looks like healing somewhat.   3/17- slowly improving- peeling dry skin.  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 Abnormal LFTs: Recheck labs in am.   3/10- mild elevation of ALT only- will con't to monitor 15. ABLA: Due to critical illness. Will check anemia panel.   3/10- looking better- con't treatment 16. Right Vocal fold immobility,  dysphonia:s/p transnasal fiberoptic laryngoscopy by ENT on 3/9 but was aborted due to gagging and vomiting. ENT will re-attempt in a couple of days.   3/11- could hear a few words using PMV this AM- will con't to monitor  Pt ok with ENT re attempting laryngoscopy   3/16- will call ENT to get scope done for pt.  3/17- waiting for ENT   3/18- [place] scoped [place]- [place]- will arrange per ENT   18. Disposition: Patient lives with wife and 5 children aged 75-23 who will provide support upon discharge.    LOS: 12 days A FACE TO FACE EVALUATION WAS PERFORMED  Erick Colace 04/04/2019, 9:42 AM

## 2019-04-04 NOTE — Plan of Care (Signed)
  Problem: Consults Goal: RH GENERAL PATIENT EDUCATION Description: See Patient Education module for education specifics. Outcome: Progressing Goal: Skin Care Protocol Initiated - if Braden Score 18 or less Description: If consults are not indicated, leave blank or document N/A Outcome: Progressing Goal: Nutrition Consult-if indicated Outcome: Progressing Goal: Diabetes Guidelines if Diabetic/Glucose > 140 Description: If diabetic or lab glucose is > 140 mg/dl - Initiate Diabetes/Hyperglycemia Guidelines & Document Interventions  Outcome: Progressing   Problem: RH BOWEL ELIMINATION Goal: RH STG MANAGE BOWEL WITH ASSISTANCE Description: STG Manage Bowel with mod I Assistance. Outcome: Progressing   Problem: RH BLADDER ELIMINATION Goal: RH STG MANAGE BLADDER WITH ASSISTANCE Description: STG Manage Bladder With mod I Assistance Outcome: Progressing   Problem: RH SKIN INTEGRITY Goal: RH STG SKIN FREE OF INFECTION/BREAKDOWN Description: No new areas of break down while on CIR Outcome: Progressing Goal: RH STG MAINTAIN SKIN INTEGRITY WITH ASSISTANCE Description: STG Maintain Skin Integrity With mod I Assistance. Outcome: Progressing   Problem: RH SAFETY Goal: RH STG ADHERE TO SAFETY PRECAUTIONS W/ASSISTANCE/DEVICE Description: STG Adhere to Safety Precautions With mod I Assistance/Device. Outcome: Progressing   Problem: RH PAIN MANAGEMENT Goal: RH STG PAIN MANAGED AT OR BELOW PT'S PAIN GOAL Description: Pain goal of <3/10 Outcome: Progressing   Problem: RH KNOWLEDGE DEFICIT GENERAL Goal: RH STG INCREASE KNOWLEDGE OF SELF CARE AFTER HOSPITALIZATION Outcome: Progressing

## 2019-04-05 ENCOUNTER — Inpatient Hospital Stay (HOSPITAL_COMMUNITY): Payer: Self-pay | Admitting: Speech Pathology

## 2019-04-05 ENCOUNTER — Inpatient Hospital Stay (HOSPITAL_COMMUNITY): Payer: Self-pay

## 2019-04-05 ENCOUNTER — Encounter (HOSPITAL_COMMUNITY): Payer: Self-pay | Admitting: Otolaryngology

## 2019-04-05 ENCOUNTER — Inpatient Hospital Stay (HOSPITAL_COMMUNITY): Payer: Self-pay | Admitting: Occupational Therapy

## 2019-04-05 LAB — BASIC METABOLIC PANEL
Anion gap: 11 (ref 5–15)
BUN: 19 mg/dL (ref 6–20)
CO2: 27 mmol/L (ref 22–32)
Calcium: 9 mg/dL (ref 8.9–10.3)
Chloride: 98 mmol/L (ref 98–111)
Creatinine, Ser: 0.4 mg/dL — ABNORMAL LOW (ref 0.61–1.24)
GFR calc Af Amer: >60 mL/min (ref >60)
GFR calc non Af Amer: >60 mL/min (ref >60)
Glucose, Bld: 82 mg/dL (ref 70–99)
Potassium: 4.1 mmol/L (ref 3.5–5.1)
Sodium: 136 mmol/L (ref 135–145)

## 2019-04-05 LAB — GLUCOSE, CAPILLARY
Glucose-Capillary: 86 mg/dL (ref 70–99)
Glucose-Capillary: 61 mg/dL — ABNORMAL LOW (ref 70–99)
Glucose-Capillary: 121 mg/dL — ABNORMAL HIGH (ref 70–99)
Glucose-Capillary: 188 mg/dL — ABNORMAL HIGH (ref 70–99)
Glucose-Capillary: 193 mg/dL — ABNORMAL HIGH (ref 70–99)
Glucose-Capillary: 74 mg/dL (ref 70–99)

## 2019-04-05 LAB — CBC
HCT: 36.7 % — ABNORMAL LOW (ref 39.0–52.0)
Hemoglobin: 11.4 g/dL — ABNORMAL LOW (ref 13.0–17.0)
MCH: 26.5 pg (ref 26.0–34.0)
MCHC: 31.1 g/dL (ref 30.0–36.0)
MCV: 85.3 fL (ref 80.0–100.0)
Platelets: 482 10*3/uL — ABNORMAL HIGH (ref 150–400)
RBC: 4.3 MIL/uL (ref 4.22–5.81)
RDW: 17 % — ABNORMAL HIGH (ref 11.5–15.5)
WBC: 6.8 10*3/uL (ref 4.0–10.5)
nRBC: 0 % (ref 0.0–0.2)

## 2019-04-05 MED ORDER — INSULIN DETEMIR 100 UNIT/ML ~~LOC~~ SOLN
20.0000 [IU] | Freq: Two times a day (BID) | SUBCUTANEOUS | Status: DC
  Administered 2019-04-05 – 2019-04-08 (×5): 20 [IU] via SUBCUTANEOUS
  Filled 2019-04-05 (×9): qty 0.2

## 2019-04-05 MED ORDER — INSULIN ASPART 100 UNIT/ML ~~LOC~~ SOLN
0.0000 [IU] | Freq: Every day | SUBCUTANEOUS | Status: DC
  Administered 2019-04-05 – 2019-04-07 (×5): 2 [IU] via SUBCUTANEOUS
  Administered 2019-04-07 (×2): 3 [IU] via SUBCUTANEOUS
  Administered 2019-04-08 – 2019-04-09 (×2): 2 [IU] via SUBCUTANEOUS

## 2019-04-05 NOTE — Plan of Care (Signed)
  Problem: RH BOWEL ELIMINATION Goal: RH STG MANAGE BOWEL WITH ASSISTANCE Description: STG Manage Bowel with mod I Assistance. Outcome: Progressing   Problem: RH BLADDER ELIMINATION Goal: RH STG MANAGE BLADDER WITH ASSISTANCE Description: STG Manage Bladder With mod I Assistance Outcome: Progressing   Problem: RH SKIN INTEGRITY Goal: RH STG SKIN FREE OF INFECTION/BREAKDOWN Description: No new areas of break down while on CIR Outcome: Progressing Goal: RH STG MAINTAIN SKIN INTEGRITY WITH ASSISTANCE Description: STG Maintain Skin Integrity With mod I Assistance. Outcome: Progressing   Problem: RH SAFETY Goal: RH STG ADHERE TO SAFETY PRECAUTIONS W/ASSISTANCE/DEVICE Description: STG Adhere to Safety Precautions With mod I Assistance/Device. Outcome: Progressing   Problem: RH PAIN MANAGEMENT Goal: RH STG PAIN MANAGED AT OR BELOW PT'S PAIN GOAL Description: Pain goal of <3/10 Outcome: Progressing   Problem: RH KNOWLEDGE DEFICIT GENERAL Goal: RH STG INCREASE KNOWLEDGE OF SELF CARE AFTER HOSPITALIZATION Outcome: Progressing

## 2019-04-05 NOTE — Progress Notes (Signed)
Tube feeds were adjusted to 5X day on Friday as patient complaining of bloating and feeling of fullness. He was refusing tube feeds therefore RD consulted for input on formula change and recommended smaller more frequent amounts. Per RN today, he continues to complain of bloating intermittently.  Will change BS to ac 5 X day to correspond to tube feeds. Will d/c lunch time novolog and decrease levemir to 20 units bid due to hypoglycemic episodes. May also need to change insulin to 70/30 due to lack of insurance/cost.

## 2019-04-05 NOTE — Progress Notes (Signed)
Occupational Therapy Session Note  Patient Details  Name: Andrew Bruce MRN: [id number] Date of Birth: 1965/06/06  Today's Date: 04/05/2019 OT Individual Time: 0900-1000 OT Individual Time Calculation (min): 60 min    Short Term Goals: Week 2:  OT Short Term Goal 1 (Week 2): Pt will perform 3/3 toileting tasks wtih min  A OT Short Term Goal 2 (Week 2): Pt will perform bed mobility in prep for ADL with supervision OT Short Term Goal 3 (Week 2): Pt will stand to perform one grooming task at the sink with min A OT Short Term Goal 4 (Week 2): Pt will don LB clothing with min A  Skilled Therapeutic Interventions/Progress Updates:    Patient in bed, alert and ready for therapy session.  Interpreter present for session.  LB bathing and dressing completed bed level with set up, donned post OP shoes seated edge of bed with max A.   Supine to sit with CS using bed rail.  SPT to w/c with CGA.  Oral care completed with set up (suction toothbrush)   Completed UB stretching and AROM activities with focus on right proximal stability and symmetry at end range.  Completed ambulation with RW CGA approx 50 feet.  Completed UB ergometer 2 x 3 minutes.  O2 saturation remained in the 90s with O2 via trach collar, PMV in place.   Returned to bed, CGA SPT, CS sitting to supine.  Bed alarm set and call bell in reach.    Therapy Documentation Precautions:  Precautions Precautions: Fall, Other (comment) Precaution Comments: PMSV at all times, PEG;  NPO Restrictions Weight Bearing Restrictions: No Other Position/Activity Restrictions: HOB elevated 30 degrees at all times General:   Vital Signs:   Pain: Pain Assessment Pain Scale: 0-10 Pain Score: 0-No pain  Therapy/Group: Individual Therapy  [name] 04/05/2019, 12:12 PM

## 2019-04-05 NOTE — Progress Notes (Addendum)
Physical Therapy Session Note  Patient Details  Name: Andrew Bruce MRN: [id number] Date of Birth: [birth year]  Today's Date: 04/05/2019 PT Individual Time: 9242-6834 PT Individual Time Calculation (min): 72 min   Short Term Goals: Week 2:  PT Short Term Goal 1 (Week 2): Patient will consistently perform all bed mobility with supervision without use of bed rails. PT Short Term Goal 2 (Week 2): Patient will perform basic transfers with close supervision using LRAD. PT Short Term Goal 3 (Week 2): Patient will ambulate >25 ft using LRAD. PT Short Term Goal 4 (Week 2): Patient will tolerate sitting OOB >1 hour.  Skilled Therapeutic Interventions/Progress Updates:     Patient in bed on 28% FiO2 upon PT arrival. Patient alert and agreeable to PT session. Patient reported 3-4/10 L great toe and foot pain during session, RN made aware and provided pain medicine at end of session. PT provided repositioning, rest breaks, and distraction as pain interventions throughout session. Assessed patient's toes throughout session with mobility, no visible changes after gait or stair training. Patient wore Darco post-op shoes with gait and stair training today. Adjusted straps to comfort during session to reduce foot pain.  Interpreter arrived at beginning of session and remained throughout session.  Therapeutic Activity: Bed Mobility: Patient performed supine to/from sit with CGA-supervision with hospital bed functions. Provided verbal cues for performing rolling to R side to push to sit up without assist, required CGA for balance. Transfers: Patient performed sit to/from stand from the bed, BSC over the toilet, the TIS w/c, and a standard chair with arms with CGA and stand pivot with min A-CGA for balance. Provided verbal cues for reaching back to sit and completing turn when pivoting prior to sitting for safety. Patient was continent of bladder, but unable to have a BM during toileting. Required supervision for  peri-care and LB dressing.  Gait Training:  Patient ambulated 132 feet and 102 feet feet without UE support with min A-CGA for balance. Ambulated with decreased step length on L, decreased stance time on R, decreased step height and DF on R, mild variable foot placement, and intermittent downward head gaze. Provided verbal cues for increased step height on R, heel weight bearing, and consistent step length bilatterally. Patient ascended/descended 4-6" steps using B rails with CGA. Performed step-to gait pattern leading with L while ascending and R while descending. Provided cues for technique and sequencing.  Provided cues for pursed lip breathing and diaphragmatic breathing for recovery of SOB after activity.  SPO2 >90% on 28% FiO2 and HR 106-122 after activity. Recovered to >95% SPO2 and HR 100-110 bpm before continuing with activity.  Wheelchair Mobility:  Patient was transported in the TIS w/c with total A throughout session for energy conservation and time management.  Patient in bed on 28% FIO2 with RN and interpreter in room at end of session with breaks locked, bed alarm set, and all needs within reach. Educated patient and RN about patient transfers and encouraged patient to sit in the TIS w/c 30-60 min 2x per day to increased sitting tolerance. Patient and RN in agreement.   Therapy Documentation Precautions:  Precautions Precautions: Fall, Other (comment) Precaution Comments: PMSV at all times, PEG;  NPO Restrictions Weight Bearing Restrictions: No Other Position/Activity Restrictions: HOB elevated 30 degrees at all times    Therapy/Group: Individual Therapy  Zinedine Ellner L [name] PT, DPT  04/05/2019, 5:17 PM

## 2019-04-05 NOTE — Progress Notes (Signed)
[facility] PHYSICAL MEDICINE & REHABILITATION PROGRESS NOTE   Subjective/Complaints:   Pt reports a little pain in toes- enough he's taking prn pain meds every so often.  Pt also reports R shoulder isn't hurting currently- wasn't clear if hurts during therapy.  Has PMV in place and sats 98%.   ROS:   Pt denies SOB, abd pain, CP, N/V/C/D, and vision changes     Objective:   No results found. Recent Labs    04/05/19 0547  WBC 6.8  HGB 11.4*  HCT 36.7*  PLT 482*   Recent Labs    04/05/19 0547  NA 136  K 4.1  CL 98  CO2 27  GLUCOSE 82  BUN 19  CREATININE 0.40*  CALCIUM 9.0    Intake/Output Summary (Last 24 hours) at 04/05/2019 1036 Last data filed at 04/05/2019 0820 Gross per 24 hour  Intake 100 ml  Output 1000 ml  Net -900 ml     Physical Exam: Vital Signs Blood pressure 108/79, pulse 99, temperature 98.8 F (37.1 C), temperature source Oral, resp. rate 17, height 5\' 6"  (1.676 m), weight 73.4 kg, SpO2 99 %.  Nursing note and vitals reviewed.   General:laying supine in bed; appears comfortable, NAD HEENT: trach in place; wearing PMV and sats still 98% with O2 via trach Flattened affect Heart: borderline tachycardia; regular rhythm Lungs: was actually CTA B/L except a tiny bit of coarse breath sounds on R base- good air movement Abdomen: (+) PEG, soft, NT, ND, (+)BS Extremities: Gangrenous 1st and 2nd toes bilaterally nore extensive on the left - 1st toes still look completely black, however other toes looking better as skin peels.     Motor 4- BUE and 3- B HF, 4- Knee ext , 3- ADF , PF    Assessment/Plan: 1. 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  Physiatrist is providing close team supervision and 24 hour management of active medical problems listed below.  Physiatrist and rehab team continue  to assess barriers to discharge/monitor patient progress toward functional and medical goals  Care Tool:  Bathing    Body parts bathed by patient: Right arm, Left arm, Chest, Abdomen, Face   Body parts bathed by helper: Front perineal area, Buttocks, Right upper leg, Left upper leg, Right lower leg, Left lower leg     Bathing assist Assist Level: Moderate Assistance - Patient 50 - 74%     Upper Body Dressing/Undressing Upper body dressing   What is the patient wearing?: Pull over shirt    Upper body assist Assist Level: Minimal Assistance - Patient > 75%    Lower Body Dressing/Undressing Lower body dressing      What is the patient wearing?: Pants     Lower body assist Assist for lower body dressing: Maximal Assistance - Patient 25 - 49%     Toileting Toileting Toileting Activity did not occur (Clothing management and hygiene only): N/A (no void or bm)(foley)  Toileting assist Assist for toileting: Dependent - Patient 0%     Transfers Chair/bed transfer  Transfers assist  Chair/bed transfer activity did not occur: Safety/medical concerns(limited by dizziness, chest pain)  Chair/bed transfer assist level: Minimal Assistance - Patient > 75% Chair/bed transfer assistive device: Programmer, multimedia   Ambulation assist   Ambulation activity did not occur: Safety/medical concerns(limited by dizziness, chest pain)  Assist level: Minimal Assistance -  Patient > 75% Assistive device: No Device Max distance: 90'   Walk 10 feet activity   Assist  Walk 10 feet activity did not occur: Safety/medical concerns(limited by dizziness, chest pain)  Assist level: Minimal Assistance - Patient > 75% Assistive device: No Device   Walk 50 feet activity   Assist Walk 50 feet with 2 turns activity did not occur: Safety/medical concerns(limited by dizziness, chest pain)  Assist level: Minimal Assistance - Patient > 75% Assistive device: No Device    Walk 150  feet activity   Assist Walk 150 feet activity did not occur: Safety/medical concerns         Walk 10 feet on uneven surface  activity   Assist Walk 10 feet on uneven surfaces activity did not occur: Safety/medical concerns(limited by dizziness, chest pain)         Wheelchair     Assist     Wheelchair activity did not occur: Safety/medical concerns(limited by dizziness, chest pain)         Wheelchair 50 feet with 2 turns activity    Assist    Wheelchair 50 feet with 2 turns activity did not occur: Safety/medical concerns(limited by dizziness, chest pain)       Wheelchair 150 feet activity     Assist  Wheelchair 150 feet activity did not occur: Safety/medical concerns(limited by dizziness, chest pain)       Blood pressure 108/79, pulse 99, temperature 98.8 F (37.1 C), temperature source Oral, resp. rate 17, height 5\' 6"  (1.676 m), weight 73.4 kg, SpO2 99 %.  1.  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              -patient may shower             -ELOS/Goals: 2-3 weeks minA , Cont CIR  PT, OT, SLP 2.  Antithrombotics: -DVT/anticoagulation:  Pharmaceutical: Other (comment)--Eliquis             -antiplatelet therapy: N/A 3. Pain Management: Fentanyl IV prior to hydrotherapy. Will add oxycodone prn for sacral pain.   3/13- pain doing better- con't regimen for now.   3/14- R shoulder pain is doing better  4. Mood: LCSW to follow for evaluation and support.              -antipsychotic agents: N/A 5. Neuropsych: This patient appears to be capable of making decisions on his own behalf. 6. Sacral decubitus/Skin/Wound Care- Was unstageable- initially, covered with eshar initially- looks deep Stage III currently. : continue air mattress overlay.  Hydrotherapy 6 days a week. Tube feeds with additional protein supplement and vitamins to help promote wound healing.   3/13- have wet to dry on days not getting  hydrotherapy.  3/17- plan to re-eval wound for treatment as of next week- cleaning up nicely, per my assessment and WOC  3/18- got pic of wound- a little slough and eschar left- but improving- con't wound care/hydrotherapy for a least 1 week.  3/22- stopped hydrotherapy Friday- d/c'd Fentanyl 7. Fluids/Electrolytes/Nutrition: Monitor I/O. Check lytes in am and weekly.  8. T2DM: Hgb A1c- 11.7--continue Levemir bid with SSI every 4 hours for elevated BS. Will consult dietician to start bolus tube feeds.    CBG (last 3)  Recent Labs    04/04/19 2024 04/04/19 2235 04/05/19 0611  GLUCAP 189* 119* 86   Good am control will cont to  monitor prior to additional changes     3/18- DM team  added Novolog 3 units- will monitor  3/19- DM coordinator- decreased levemir to 25 units on 3/20 and SSI to sensitive  3/22- BGs much improved- con't regimen 9. Dizziness: Will change Cardura to bedtime to avoid SE. Will monitor orthostatic Bps  3/14- BP on soft side and slightly tachycardic, likely due to breahting meds and overall deconditioning- con't to monitor 10. 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   3/11- will add Duonebs QID- not prn-   3/12- improved SOB- con't regimen- discussed with resp therapy.  3/14- con't Duonebs-TID   3/17- will try and wean O2 to off if possible since pt's O2 sats 99-100% via TC- will consult resp for assistance.    12. Resting tachycardia: Due to deconditioning. No BB due to soft BP/orthostatic symptoms.  Monitor for now.    Vitals:   04/05/19 0319 04/05/19 0559  BP:  108/79  Pulse: 98 99  Resp: 16 17  Temp:  98.8 F (37.1 C)  SpO2: 97% 99%  mild  Diastolic elevation   3/22- BP doing well, but Pulse still 98 today on exam 13. Gangrenous changes bilateral feet: Will order off loading shoes. Continue to monitor.   Primarily affecting 1st and 2nd toes bilaterally - pedal and post tib pulses normal , likely small vessel infarcts    3/16- toes doing better- peels dry skin and looks like healing somewhat.   3/17- slowly improving- peeling dry skin.  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 Abnormal LFTs: Recheck labs in am.   3/10- mild elevation of ALT only- will con't to monitor 15. ABLA: Due to critical illness. Will check anemia panel.   3/10- looking better- con't treatment 16. Right Vocal fold immobility, dysphonia:s/p transnasal fiberoptic laryngoscopy by ENT on 3/9 but was aborted due to gagging and vomiting. ENT will re-attempt in a couple of days.   3/11- could hear a few words using PMV this AM- will con't to monitor  Pt ok with ENT re attempting laryngoscopy   3/16- will call ENT to get scope done for pt.  3/17- waiting for ENT   3/18- ENT scoped pt- going to OR next week- will arrange per ENT  3/22- don't know day- will find out from ENT.   18. Disposition: Patient lives with wife and 5 children aged 84-23 who will provide support upon discharge.    LOS: 13 days A FACE TO FACE EVALUATION WAS PERFORMED  Jayant Kriz 04/05/2019, 10:36 AM

## 2019-04-05 NOTE — Progress Notes (Signed)
Patient's wife signed procedural consent on his behalf. They both stated the provider discussed the procedure with them and when asked, they did not have any questions. Video interpreter was used. RN signed as a witness and consent placed in patient's chart.

## 2019-04-05 NOTE — Progress Notes (Signed)
Speech Language Pathology Daily Session Note  Patient Details  Name: Andrew Bruce MRN: [id number] Date of Birth: 06/22/1965  Today's Date: 04/05/2019 SLP Individual Time: 1100-1130 SLP Individual Time Calculation (min): 30 min  Short Term Goals: Week 2: SLP Short Term Goal 1 (Week 2): Pt will complete 5 repetitions with EMST 150 device set at 30 cmH20 and IMST device set at 10 cmH20  with mod A verbal instruction. SLP Short Term Goal 2 (Week 2): Pt will demonstrate donning and doffing of PMSV with mod A multimodal cues SLP Short Term Goal 3 (Week 2): Pt will demonstrate knowledge of vocal cord function/impairment by answering yes/no questions accuracy in 7 out of 10 opportunity with Min A cues. SLP Short Term Goal 4 (Week 2): Pt will communicate basic wants and needs with supervision level questions for verification of message. SLP Short Term Goal 5 (Week 2): Pt will consume ice chips at bedside to rehabilitate musculature and to increase ROM.  Skilled Therapeutic Interventions:  Skilled treatment session targeted cognition goals. Pt unable to explain or demonstrate use of EMST/IMST devices and made glottal sounds when attempting to demonstrate. Pt currently has strong cough. Pt had further questions about pending ENT procedure. SLP sent secure chat to pt's ENT with pt's questions. Pt pleasnatly declined ice chips as his teeth are extremely sensitive to the cold. Pt appeared very distracted by the condition of his toes. Emotional support provided.      Pain Pain Assessment Pain Scale: 0-10 Pain Score: 8  Pain Type: Acute pain Pain Location: Foot Pain Orientation: Left Pain Descriptors / Indicators: Throbbing Pain Frequency: Intermittent Pain Onset: On-going Patients Stated Pain Goal: 2 Pain Intervention(s): Medication (See eMAR)  Therapy/Group: Individual Therapy  Yonis Carreon 04/05/2019, 3:09 PM

## 2019-04-05 NOTE — Progress Notes (Signed)
Niferex (capsule) can be opened and given through the tube per pharmacist.

## 2019-04-05 NOTE — Progress Notes (Signed)
   Subjective:    Patient ID: Andrew Bruce, male    DOB: 23-Dec-1965, 54 y.o.   MRN: [id number]  HPI No changes to voice.  Tolerating Passy-muir valve.  Review of Systems     Objective:   Physical Exam AF VSS Alert, NAD Aphonic with Passy-Muir valve in place, easy breathing     Assessment & Plan:  Dysphonia, dysphagia, vocal fold immobility, trach status  Using a video interpreter, I reviewed his flexible laryngoscopic findings and discussed plans tomorrow for microlaryngoscopy with possible Prolaryn injection versus steroid injection.  Questions were answered.  Risks, benefits, and alternatives were discussed and he expressed understanding and agreement.

## 2019-04-05 NOTE — Anesthesia Preprocedure Evaluation (Addendum)
Anesthesia Evaluation  Patient identified by MRN, date of birth, ID band Patient awake    Reviewed: Allergy & Precautions, NPO status , Patient's Chart, lab work & pertinent test results  History of Anesthesia Complications Negative for: history of anesthetic complications  Airway Mallampati: Trach       Dental   Pulmonary PE   Pulmonary exam normal        Cardiovascular + DVT  Normal cardiovascular exam     Neuro/Psych CVA negative psych ROS   GI/Hepatic negative GI ROS, Neg liver ROS,   Endo/Other  diabetes, Type 2  Renal/GU negative Renal ROS  negative genitourinary   Musculoskeletal negative musculoskeletal ROS (+)   Abdominal   Peds  Hematology  (+) anemia , Eliquis   Anesthesia Other Findings  Recent COVID-19 infection with hypoxic respiratory failure and embolic stroke, s/p trach/PEG; discharged to rehab on 3/9 - 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  Echo 02/17/19: EF 55-60%, moderately reduced RV systolic function, hypokinetic RV free wall, findings consistent with acute cor pulmonale, mildly elevated PA pressure  Reproductive/Obstetrics                           Anesthesia Physical Anesthesia Plan  ASA: III  Anesthesia Plan: General   Post-op Pain Management:    Induction: Intravenous and Inhalational  PONV Risk Score and Plan: 2 and Ondansetron, Dexamethasone, Treatment may vary due to age or medical condition and Midazolam  Airway Management Planned: Tracheostomy  Additional Equipment: None  Intra-op Plan:   Post-operative Plan: Extubation in OR  Informed Consent: I have reviewed the patients History and Physical, chart, labs and discussed the procedure including the risks, benefits and alternatives for the proposed anesthesia with the patient or authorized representative who has indicated his/her understanding and acceptance.       Plan  Discussed with:   Anesthesia Plan Comments:        Anesthesia Quick Evaluation

## 2019-04-05 NOTE — Progress Notes (Signed)
Orthostatic b/p monitoring obtained standing recording patient wa asymptomatic denies dizziness, nausea,vomiting, lightheaded, or discomfort using the interpretor to communicate. Medicated x1 for feet pain. Otherwise denies discomfort, resting during shift,with out acute respiratory distress or discomfort,Tolerated TF bolus well , Call bell within reach, monitor and assisted

## 2019-04-06 ENCOUNTER — Inpatient Hospital Stay (HOSPITAL_COMMUNITY): Admission: RE | Admit: 2019-04-06 | Payer: Self-pay | Source: Home / Self Care | Admitting: Otolaryngology

## 2019-04-06 ENCOUNTER — Inpatient Hospital Stay (HOSPITAL_COMMUNITY): Payer: Self-pay

## 2019-04-06 ENCOUNTER — Inpatient Hospital Stay (HOSPITAL_COMMUNITY): Payer: Medicaid Other | Admitting: Anesthesiology

## 2019-04-06 ENCOUNTER — Inpatient Hospital Stay (HOSPITAL_COMMUNITY): Payer: Self-pay | Admitting: Occupational Therapy

## 2019-04-06 ENCOUNTER — Inpatient Hospital Stay (HOSPITAL_COMMUNITY): Payer: Self-pay | Admitting: Speech Pathology

## 2019-04-06 ENCOUNTER — Encounter (HOSPITAL_COMMUNITY)
Admission: RE | Disposition: A | Discharge status: Home Health | Payer: Self-pay | Source: Intra-hospital | Attending: Physical Medicine and Rehabilitation

## 2019-04-06 HISTORY — PX: DIRECT LARYNGOSCOPY: SHX5326

## 2019-04-06 LAB — GLUCOSE, CAPILLARY
Glucose-Capillary: 90 mg/dL (ref 70–99)
Glucose-Capillary: 80 mg/dL (ref 70–99)
Glucose-Capillary: 80 mg/dL (ref 70–99)
Glucose-Capillary: 177 mg/dL — ABNORMAL HIGH (ref 70–99)
Glucose-Capillary: 189 mg/dL — ABNORMAL HIGH (ref 70–99)
Glucose-Capillary: 256 mg/dL — ABNORMAL HIGH (ref 70–99)
Glucose-Capillary: 158 mg/dL — ABNORMAL HIGH (ref 70–99)

## 2019-04-06 SURGERY — LARYNGOSCOPY, DIRECT
Anesthesia: General | Site: Throat | Laterality: Bilateral

## 2019-04-06 MED ORDER — TRIAMCINOLONE ACETONIDE 40 MG/ML IJ SUSP
INTRAMUSCULAR | Status: AC
  Filled 2019-04-06: qty 5

## 2019-04-06 MED ORDER — LACTATED RINGERS IV SOLN: INTRAVENOUS | Status: DC | PRN

## 2019-04-06 MED ORDER — MIDAZOLAM HCL 2 MG/2ML IJ SOLN
INTRAMUSCULAR | Status: AC
  Filled 2019-04-06: qty 2

## 2019-04-06 MED ORDER — FENTANYL CITRATE (PF) 250 MCG/5ML IJ SOLN
INTRAMUSCULAR | Status: AC
  Filled 2019-04-06: qty 5

## 2019-04-06 MED ORDER — MIDAZOLAM HCL 5 MG/5ML IJ SOLN
INTRAMUSCULAR | Status: DC | PRN
  Administered 2019-04-06: 140 mg via INTRAVENOUS
  Administered 2019-04-06: 2 mg via INTRAVENOUS

## 2019-04-06 MED ORDER — OXYMETAZOLINE HCL 0.05 % NA SOLN
NASAL | Status: AC
  Filled 2019-04-06: qty 30

## 2019-04-06 MED ORDER — EPINEPHRINE HCL (NASAL) 0.1 % NA SOLN
NASAL | Status: AC
  Filled 2019-04-06: qty 30

## 2019-04-06 MED ORDER — SUGAMMADEX SODIUM 200 MG/2ML IV SOLN
INTRAVENOUS | Status: DC | PRN
  Administered 2019-04-06: 200 mg via INTRAVENOUS

## 2019-04-06 MED ORDER — TRIAMCINOLONE ACETONIDE 40 MG/ML IJ SUSP
INTRAMUSCULAR | Status: DC | PRN
  Administered 2019-04-06: 1.5 mL

## 2019-04-06 MED ORDER — PROPOFOL 10 MG/ML IV BOLUS
INTRAVENOUS | Status: AC
  Filled 2019-04-06: qty 40

## 2019-04-06 SURGICAL SUPPLY — 23 items
CANISTER SUCT 3000ML PPV (MISCELLANEOUS) ×3 IMPLANT
COVER BACK TABLE 60X90IN (DRAPES) ×3 IMPLANT
COVER MAYO STAND STRL (DRAPES) ×3 IMPLANT
COVER WAND RF STERILE (DRAPES) ×3 IMPLANT
DRAPE HALF SHEET 40X57 (DRAPES) ×3 IMPLANT
GAUZE 4X4 16PLY RFD (DISPOSABLE) ×3 IMPLANT
GLOVE BIO SURGEON STRL SZ7.5 (GLOVE) ×3 IMPLANT
GOWN STRL REUS W/ TWL LRG LVL3 (GOWN DISPOSABLE) IMPLANT
GOWN STRL REUS W/TWL LRG LVL3 (GOWN DISPOSABLE) ×6
GUARD TEETH (MISCELLANEOUS) ×3 IMPLANT
KIT PROLARN PLUS GEL W/NDL (Prosthesis and Implant ENT) ×2 IMPLANT
KIT TURNOVER KIT B (KITS) ×3 IMPLANT
NS IRRIG 1000ML POUR BTL (IV SOLUTION) ×3 IMPLANT
PAD ARMBOARD 7.5X6 YLW CONV (MISCELLANEOUS) ×6 IMPLANT
PATTIES SURGICAL .5 X1 (DISPOSABLE) ×2 IMPLANT
PATTIES SURGICAL .5 X3 (DISPOSABLE) ×2 IMPLANT
POSITIONER HEAD DONUT 9IN (MISCELLANEOUS) ×2 IMPLANT
SET COLLECT BLD 25X3/4 12 (NEEDLE) ×2 IMPLANT
SOL ANTI FOG 6CC (MISCELLANEOUS) IMPLANT
SOLUTION ANTI FOG 6CC (MISCELLANEOUS) ×2
TOWEL GREEN STERILE FF (TOWEL DISPOSABLE) ×6 IMPLANT
TUBE CONNECTING 12'X1/4 (SUCTIONS) ×1
TUBE CONNECTING 12X1/4 (SUCTIONS) ×2 IMPLANT

## 2019-04-06 NOTE — Anesthesia Postprocedure Evaluation (Signed)
Anesthesia Post Note  Patient: Andrew Bruce  Procedure(s) Performed: MICRO DIRECT LARYNGOSCOPY WITH PROLARYN INJECTION (Bilateral Throat)     Patient location during evaluation: PACU Anesthesia Type: General Level of consciousness: awake and alert Pain management: pain level controlled Vital Signs Assessment: post-procedure vital signs reviewed and stable Respiratory status: spontaneous breathing, nonlabored ventilation, respiratory function stable and patient connected to tracheostomy mask oxygen Cardiovascular status: blood pressure returned to baseline and stable Postop Assessment: no apparent nausea or vomiting Anesthetic complications: no    Last Vitals:  Vitals:   04/06/19 0835 04/06/19 0843  BP: 137/89 129/89  Pulse: 90   Resp: 13   Temp:    SpO2: 95%     Last Pain:  Vitals:   04/06/19 0830  TempSrc:   PainSc: 0-No pain                 Lucretia Kern

## 2019-04-06 NOTE — Progress Notes (Signed)
Patient c/o feet pain medicated with Oxycodone 5 mg per orders, pain scale 5/10, repositioned

## 2019-04-06 NOTE — Progress Notes (Signed)
Physical Therapy Session Note  Patient Details  Name: Andrew Bruce MRN: [id number] Date of Birth: 05-01-1965  Today's Date: 04/06/2019 PT Individual Time: 2992-4268 PT Individual Time Calculation (min): 20 min   Short Term Goals: Week 2:  PT Short Term Goal 1 (Week 2): Patient will consistently perform all bed mobility with supervision without use of bed rails. PT Short Term Goal 2 (Week 2): Patient will perform basic transfers with close supervision using LRAD. PT Short Term Goal 3 (Week 2): Patient will ambulate >25 ft using LRAD. PT Short Term Goal 4 (Week 2): Patient will tolerate sitting OOB >1 hour.  Skilled Therapeutic Interventions/Progress Updates:     Patient in bed on 28% FiO2 with interpreter in the room upon PT arrival. Interpreter present throughout session. Patient alert and agreeable to PT session. Patient reported moderate headache and  L toe pain during session, RN made aware. PT provided repositioning, rest breaks, and distraction as pain interventions throughout session.  He also stated that he did not feel well and felt "dizzy," however, did not report that the room was spinning, but could not describe the sensation any other way.   Therapeutic Activity: Bed Mobility: Patient performed scooting up in bed performing bridging using heels only with mod A with HOB elevated to 30 degrees.   Therapeutic Exercise: Patient performed the following exercises bed level, due to fatigue and pain this morning following surgical procedure, with verbal and tactile cues for proper technique. -B ankle pumps x20 -B knee/hip flexion AAROM 2x10 -B hip abduction 2x10  -B DF stretch 2x30 sec -B hamstring stretch 2x30 sec  Patient in bed on 28% FiO2 with interpreter in the room at end of session with breaks locked, bed alarm set, and all needs within reach.    Therapy Documentation Precautions:  Precautions Precautions: Fall, Other (comment) Precaution Comments: PMSV at all times,  PEG;  NPO Restrictions Weight Bearing Restrictions: No Other Position/Activity Restrictions: HOB elevated 30 degrees at all times General: PT Amount of Missed Time (min): 10 Minutes PT Missed Treatment Reason: Pain;Patient fatigue    Therapy/Group: Individual Therapy  Harmony Sandell L Judas Mohammad PT, DPT  04/06/2019, 12:15 PM

## 2019-04-06 NOTE — Progress Notes (Signed)
Pre-operatively prepared for surgery this morning per standard protocol and orders, CHG bath provided x2, CBG 80 at 630 am, RT notified regarding assessing patient and patient was transfer with tracheal Q2 --via ordered setting., w/o acute distress or discomfort,Transfer report called prior to transfer, VSS.Transported to OR with transported

## 2019-04-06 NOTE — Plan of Care (Signed)
  Problem: RH Furniture Transfers Goal: LTG Patient will perform furniture transfers w/assist (OT/PT) Description: LTG: Patient will perform furniture transfers  with assistance (OT/PT). Flowsheets (Taken 04/06/2019 0829) LTG: Pt will perform furniture transfers with assist:: (upgraded goal) Supervision/Verbal cueing Note: Upgraded goal due to patient's increased strength and balance with functional mobliity.   Problem: RH Ambulation Goal: LTG Patient will ambulate in controlled environment (PT) Description: LTG: Patient will ambulate in a controlled environment, # of feet with assistance (PT). Flowsheets Taken 04/06/2019 0829 LTG: Pt will ambulate in controlled environ  assist needed:: (upgraded goal) Supervision/Verbal cueing Taken 03/26/2019 1201 LTG: Ambulation distance in controlled environment: 100 ft using LRAD Note: Upgraded goal due to patient's increased strength and balance with functional mobliity.   Problem: RH Stairs Goal: LTG Patient will ambulate up and down stairs w/assist (PT) Description: LTG: Patient will ambulate up and down # of stairs with assistance (PT) Flowsheets Taken 04/06/2019 0829 LTG: Pt will ambulate up/down stairs assist needed:: (upgraded goal) Contact Guard/Touching assist Taken 03/26/2019 1201 LTG: Pt will  ambulate up and down number of stairs: 2 steps with 1 rail Note: Upgraded goal due to patient's increased strength and balance with functional mobliity.

## 2019-04-06 NOTE — Progress Notes (Signed)
NPO , no tube feeding for procedure on 04/06/19 per orders

## 2019-04-06 NOTE — Progress Notes (Signed)
Occupational Therapy Session Note  Patient Details  Name: Andrew Bruce MRN: [id number] Date of Birth: Apr 15, 1965  Today's Date: 04/06/2019 OT Individual Time: 9983-3825 OT Individual Time Calculation (min): 86 min    Short Term Goals: Week 2:  OT Short Term Goal 1 (Week 2): Pt will perform 3/3 toileting tasks wtih min  A OT Short Term Goal 2 (Week 2): Pt will perform bed mobility in prep for ADL with supervision OT Short Term Goal 3 (Week 2): Pt will stand to perform one grooming task at the sink with min A OT Short Term Goal 4 (Week 2): Pt will don LB clothing with min A  Skilled Therapeutic Interventions/Progress Updates:    Patient in bed, alert and would like to get up for therapy session.   Interpreter present for session.  PMV in place, no additional O2 or humidity during session.  O2 sat remained above 94% and HR below 115 with activity.   Supine to sit with CS.  Assisted with post op shoes dependent.  Sit to stand and short distance ambulation without AD CGA.  Transfer to shower bench with CGA.  Completed shower with long handled sponge, trach cover, IVs covered, hand held shower min a for thoroughness and cues for safety.  Completed dressing seated in standard w/c - CS/set up for OH shirt, min A for pants due to tight leg opening and sensitivity of toes.  CM in stance with CGA.  Assisted with shaving, he completes oral care with suction system with CS.  Patient able to propel w/c 120 feet x2 after initial instruction.  Reviewed removal/ placement of PMV - patient cleaned hands and demonstrated ability to remove and replace with use of mirror.  Short distance ambulation to toilet with CGA.  Completed void and CM with CGA.  Returned to bed, max A to remove post op shoes, returned to humidification, bed alarm set and call bell in reach.    Therapy Documentation Precautions:  Precautions Precautions: Fall, Other (comment) Precaution Comments: PMSV at all times, PEG;   NPO Restrictions Weight Bearing Restrictions: No Other Position/Activity Restrictions: HOB elevated 30 degrees at all times General: General PT Missed Treatment Reason: Pain;Patient fatigue Vital Signs:  Pain: Pain Assessment Pain Scale: 0-10 Pain Score: 0-No pain   Therapy/Group: Individual Therapy  [name] 04/06/2019, 2:29 PM

## 2019-04-06 NOTE — Op Note (Deleted)
  The note originally documented on this encounter has been moved the the encounter in which it belongs.  

## 2019-04-06 NOTE — Patient Care Conference (Signed)
Inpatient RehabilitationTeam Conference and Plan of Care Update Date: 04/06/2019   Time: 11:50 AM   Patient Name: Andrew Bruce      Medical Record Number: [id number]  Date of Birth: 1965-07-06 Sex: Male         Room/Bed: 4M13C/4M13C-01 Payor Info: Payor: /    Admit Date/Time:  03/23/2019  7:58 PM  Primary Diagnosis:  Embolic stroke [facility])  Patient Active Problem List   Diagnosis Date Noted  . PEG (percutaneous endoscopic gastrostomy) status (HCC) 03/24/2019  . Leukocytosis   . 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 Status post tracheostomy (HCC)   . Pressure injury of skin 03/02/2019  . On mechanically assisted ventilation (HCC)   . Goals of care, counseling/discussion   . Palliative care encounter   . ARDS (adult respiratory distress syndrome) (HCC)   . Cerebral embolism with cerebral infarction 02/17/2019  . Acute respiratory failure with hypoxia (HCC)   . Pneumothorax, right   . COVID-19 02/06/2019    Expected Discharge Date: Expected Discharge Date: 04/27/19  Team Members Present: Physician leading conference: Dr. Genice Rouge Care Coodinator Present: Roderic Palau, RN, MSN;Auria Lula Olszewski, [place] Nurse Present: Rosiland Oz, RN PT Present: Serina Cowper, PT OT Present: Towanda Malkin, OT SLP Present: Reuel Derby, SLP PPS Coordinator present : Fae Pippin, [place], PT     Current Status/Progress Goal Weekly Team Focus  Bowel/Bladder   Continent of bladder/bowels, LBM 04/04/2019  Maintain continence  Assess and assist with tolieting needs QS/PRN   Swallow/Nutrition/ Hydration   trials of ice chips to preserve swallow musculature  Min A dysphagia therapy  Ice chips and assess for readiness for repeat FEES or MBSS   ADL's   UB adl CS, LB adl min A, functional transfers CGA/CS  CS  adl training, transfer training, family education, conditioning, balance   Mobility   Min A-CGA bed mobility, CGA transfers, min A-CGA gait >100 ft without AD, CGA 4-6" steps  with B rails  Supervision overall, gait >100 ft, CGA 2 steps 1 rail  Strengthening, balance, functional mobiliyt, gait training, activity tolerance, patient/caregiver education   Communication   utilizing multimodal means of communication d/t anatomical limitations  Min A  education continued to target PMV donning and doffing   Safety/Cognition/ Behavioral Observations            Pain   c/o  pain to bilateral feet 7/10 on pain scale medicated with Oxycodone,  and prn Tylenol, denies sacral pain  Pain level <3/10  QS/PRN assessment with follow up   Skin   Sacral wound change daily with santyl damp dry, no orders for feet care  promote healing, no further impairment  QS/PRN skin assess with treatments as ordered , reposition turn Q2 and prn    Rehab Goals Patient on target to meet rehab goals: Yes *See Care Plan and progress notes for long and short-term goals.     Barriers to Discharge  Current Status/Progress Possible Resolutions Date Resolved   Nursing                  PT  Medical stability;Trach;New oxygen;Home environment access/layout  2 STE home with 1 rail, trach collar in place, currently on FiO2 at 28% with PMSV in place, orthostatic hypotension              OT                  SLP  SW Other (comments);Trach Uninsured            Discharge Planning/Teaching Needs:  D/c to home with support from his wife, children, and various family members  Family education as recommended by therapy   Team Discussion: MD has TFs, decreased levemir, hypoglycemic episodes, toes ischemic, trach in place.  RN head stuffy, dizzy, headache, BM today, suctioned, trach with PMV.  OT S bed, sit to stand CGA, transfers CGA, dressing S.  PT S bed, CGA transfers, amb 100' X 2, darco post op shoes worn, up steps with rails.  SLP MBS tomorrow.    Revisions to Treatment Plan: N/A     Medical Summary Current Status: surgery today for vocal cord paralysis; stil Trach with PMV; LBM today;  continent; less suctioning; "phlegm stuck per pt". Weekly Focus/Goal: OT_ bed SBA; sit-stand and transfers CGA; shower last week; dressing with Sup/CGA; R motor control- gaining ROM R shoulder and coordination better  Barriers to Discharge: Other (comments);Weight bearing restrictions;Decreased family/caregiver support;Home enviroment access/layout;Medical stability;Trach;Nutrition means;New oxygen;Wound care  Barriers to Discharge Comments: language- spanish pseaking; wounds; vocal cord paralysis; get off O2 if possible; MBS tomorrow AM Diet?; to see if can feed him; Possible Resolutions to Barriers: CGA for transfer no AD; walked 131ft x2 yesterday; post op shoes; better if not tightened; steps 4- CGA; FiO2 28%- >90%   Continued Need for Acute Rehabilitation Level of Care: The patient requires daily medical management by a physician with specialized training in physical medicine and rehabilitation for the following reasons: Direction of a multidisciplinary physical rehabilitation program to maximize functional independence : Yes Medical management of patient stability for increased activity during participation in an intensive rehabilitation regime.: Yes Analysis of laboratory values and/or radiology reports with any subsequent need for medication adjustment and/or medical intervention. : Yes   I attest that I was present, lead the team conference, and concur with the assessment and plan of the team.   Jodell Cipro M 04/06/2019, 8:18 PM   Team conference was held via web/ teleconference due to [place] - 19

## 2019-04-06 NOTE — Plan of Care (Signed)
  Problem: Consults Goal: RH GENERAL PATIENT EDUCATION Description: See Patient Education module for education specifics. Outcome: Progressing Goal: Skin Care Protocol Initiated - if Braden Score 18 or less Description: If consults are not indicated, leave blank or document N/A Outcome: Progressing Goal: Nutrition Consult-if indicated Outcome: Progressing Goal: Diabetes Guidelines if Diabetic/Glucose > 140 Description: If diabetic or lab glucose is > 140 mg/dl - Initiate Diabetes/Hyperglycemia Guidelines & Document Interventions  Outcome: Progressing   Problem: RH BOWEL ELIMINATION Goal: RH STG MANAGE BOWEL WITH ASSISTANCE Description: STG Manage Bowel with mod I Assistance. Outcome: Progressing   Problem: RH BLADDER ELIMINATION Goal: RH STG MANAGE BLADDER WITH ASSISTANCE Description: STG Manage Bladder With mod I Assistance Outcome: Progressing   Problem: RH SKIN INTEGRITY Goal: RH STG SKIN FREE OF INFECTION/BREAKDOWN Description: No new areas of break down while on CIR Outcome: Progressing Goal: RH STG MAINTAIN SKIN INTEGRITY WITH ASSISTANCE Description: STG Maintain Skin Integrity With mod I Assistance. Outcome: Progressing   Problem: RH SAFETY Goal: RH STG ADHERE TO SAFETY PRECAUTIONS W/ASSISTANCE/DEVICE Description: STG Adhere to Safety Precautions With mod I Assistance/Device. Outcome: Progressing   Problem: RH PAIN MANAGEMENT Goal: RH STG PAIN MANAGED AT OR BELOW PT'S PAIN GOAL Description: Pain goal of <3/10 Outcome: Progressing   Problem: RH KNOWLEDGE DEFICIT GENERAL Goal: RH STG INCREASE KNOWLEDGE OF SELF CARE AFTER HOSPITALIZATION Outcome: Progressing   

## 2019-04-06 NOTE — Brief Op Note (Signed)
04/06/2019  8:17 AM  PATIENT:  Andrew Bruce  54 y.o. male  PRE-OPERATIVE DIAGNOSIS:  VOCAL CORD PARALYSIS  POST-OPERATIVE DIAGNOSIS:  Same  PROCEDURE:  Procedure(s): MICRO DIRECT LARYNGOSCOPY WITH POSSIBLE INJECTION (N/A) Prolaryn and Kenalog injection  SURGEON:  Surgeon(s) and Role:    * Christia Reading, MD - Primary  PHYSICIAN ASSISTANT:   ASSISTANTS: none   ANESTHESIA:   general  EBL:  Minimal  BLOOD ADMINISTERED:none  DRAINS: none   LOCAL MEDICATIONS USED:  NONE  SPECIMEN:  No Specimen  DISPOSITION OF SPECIMEN:  N/A  COUNTS:  YES  TOURNIQUET:  * No tourniquets in log *  DICTATION: .Other Dictation: Dictation Number [id number]  PLAN OF CARE: Discharge to inpatient rehab  PATIENT DISPOSITION:  PACU - hemodynamically stable.   Delay start of Pharmacological VTE agent (>24hrs) due to surgical blood loss or risk of bleeding: no

## 2019-04-06 NOTE — Progress Notes (Signed)
Late entry Patient had a hypoglycemic event of 61 on 3/22. Pam, PA was notified and ordered to bolus TF. CBG post bolus was 121. No sliding scale or scheduled insulin was administered. Pam, PA made aware.

## 2019-04-06 NOTE — Transfer of Care (Signed)
Immediate Anesthesia Transfer of Care Note  Patient: Andrew Bruce  Procedure(s) Performed: MICRO DIRECT LARYNGOSCOPY WITH POSSIBLE INJECTION (N/A )  Patient Location: PACU  Anesthesia Type:General  Level of Consciousness: awake and alert   Airway & Oxygen Therapy: Patient connected to tracheostomy mask oxygen  Post-op Assessment: Report given to RN and Post -op Vital signs reviewed and stable  Post vital signs: Reviewed and stable  Last Vitals:  Vitals Value Taken Time  BP 133/88 04/06/19 0830  Temp    Pulse 89 04/06/19 0831  Resp 18 04/06/19 0831  SpO2 96 % 04/06/19 0831  Vitals shown include unvalidated device data.  Last Pain:  Vitals:   04/06/19 0629  TempSrc: (P) Oral  PainSc: (P) 4       Patients Stated Pain Goal: 2 (04/05/19 1400)  Complications: No apparent anesthesia complications

## 2019-04-06 NOTE — Progress Notes (Signed)
Speech Language Pathology Daily Session Note  Patient Details  Name: Andrew Bruce MRN: [id number] Date of Birth: [birth year]  Pt missed 60 minutes of skilled ST as he was in OR for ENT procedure.   MBS scheduled for April 07, 2019 at 0900.    Andrew Bruce 04/06/2019, 11:45 AM

## 2019-04-06 NOTE — Op Note (Signed)
NAME: OZIE, LUPE MEDICAL RECORD VP:[id number] ACCOUNT [id number] DATE OF BIRTH:12/14/1965 FACILITY: [facility] LOCATION: [facility] PHYSICIAN:Avyn Aden DJenne Pane, MD  OPERATIVE REPORT  DATE OF PROCEDURE:  04/06/2019  PREOPERATIVE DIAGNOSIS:  Vocal fold paralysis.  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  PROCEDURE:  Suspended microdirect laryngoscopy with Prolaryn and Kenalog injections.  SURGEON:  Christia Reading, MD  ANESTHESIA:  General endotracheal anesthesia.  COMPLICATIONS:  None.  INDICATIONS:  The patient is a 54 year old male who had a recent COVID course requiring intubation with mechanical ventilation and eventual tracheostomy placement.  He has had barely any voice since then and difficulty swallowing.  He was found to have  immobility of the right vocal fold and limited left vocal fold movement by fiberoptic exam.  He presents to the operating room for surgical management.  FINDINGS:  The left vocal fold was freed to be moved with palpation, but the right vocal fold felt tethered in the posterior glottic region.  The right vocal fold remained in a paramedian position.  Kenalog was injected in the right posterior commissure  region totalling 0.15 mL.  Prolaryn was injected in the vocal folds totalling 0.35 in the right vocal fold and 0.25 in the left vocal fold.  DESCRIPTION OF PROCEDURE:  The patient was identified in the holding room, informed consent having been obtained including discussion of risks, benefits and alternatives.  The patient was brought to the operative suite and put the operative table in  supine position.  Anesthesia was induced.  The patient was maintained via endotracheal anesthesia by exchanging his trach tube for a small cuffed endotracheal tube.  The eyes were taped closed and the bed was turned 90 degrees from anesthesia.  A head  wrap was placed around the patient's head and a tooth guard was placed.  A Dedo  laryngoscope was inserted into the supraglottic position and suspended in the Mayo stand using the Lewy arm.  The operating microscope was brought into view.  The larynx was  then palpated with the long suction tip revealing good motion of the left vocal fold with palpation, but fixated right vocal fold at the posterior glottis.  A butterfly needle was then used under the microscope to inject 0.15 mL of Kenalog 40 in the  posterior right glottis, primarily in the commissure area and vocal process region of the arytenoid.  After this, the Prolaryn Plus was injected first in the right vocal fold totalling 0.35 mL and then in the left vocal fold totalling 0.25 mL with about  2/3 posteriorly and 1/3 anteriorly in each vocal fold.  After this was completed, the airway was suctioned and the laryngoscope was taken out of suspension and removed from the patient's mouth.  The tooth guard was removed.  He was returned to anesthesia  for wake up.  When breathing spontaneously his cleaned #6 uncuffed Shiley trach tube was placed back in position with a fresh Velcro trach tie.  He was then woken up further and moved to recovery room in stable condition.  CN/NUANCE  D:04/06/2019 T:04/06/2019 JOB:[id number]

## 2019-04-06 NOTE — Progress Notes (Signed)
PHYSICAL MEDICINE & REHABILITATION PROGRESS NOTE   Subjective/Complaints:  Spoke with pt with interpretor-  Pt asking about his toes- explained they hurt because getting blood flow back to feet- that's why hurting- gave him d/c date, and explained that we don't know if trach can be out when he leaves.   Pt is s/p Prolaryn and Kenalog injections today in OR by ENT.  He feels like can speak better since injections.   ROS:  Pt denies SOB, abd pain, CP, N/V/C/D, and vision changes    Objective:   No results found. Recent Labs    04/05/19 0547  WBC 6.8  HGB 11.4*  HCT 36.7*  PLT 482*   Recent Labs    04/05/19 0547  NA 136  K 4.1  CL 98  CO2 27  GLUCOSE 82  BUN 19  CREATININE 0.40*  CALCIUM 9.0    Intake/Output Summary (Last 24 hours) at 04/06/2019 1859 Last data filed at 04/06/2019 0700 Gross per 24 hour  Intake -  Output 1600 ml  Net -1600 ml     Physical Exam: Vital Signs Blood pressure 127/85, pulse 94, temperature 98.3 F (36.8 C), resp. rate 18, height 5\' 6"  (1.676 m), weight 73.4 kg, SpO2 100 %.  Nursing note and vitals reviewed.   General:laying supine in bed, like every day, talking more clearly in single words sentences, NAD HEENT: trach in place; PMV in place; O2 via trach Brighter affect Heart: RRR; no JVD Lungs: actually CTA B/L completely today Abdomen: (+) PEG; soft, NT, ND, (+)BS Extremities: Gangrenous 1st and 2nd toes bilaterally more extensive on the left - 1st toes still llook mainly block but on R, isn't as far down 1st toe Motor 4- BUE and 3- B HF, 4- Knee ext , 3- ADF , PF    Assessment/Plan: 1. 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  Physiatrist is providing close team supervision and 24 hour management of active medical problems listed below.  Physiatrist and rehab  team continue to assess barriers to discharge/monitor patient progress toward functional and medical goals  Care Tool:  Bathing    Body parts bathed by patient: Right arm, Left arm, Chest, Abdomen, Face, Front perineal area, Buttocks, Right upper leg, Left upper leg, Right lower leg, Left lower leg   Body parts bathed by helper: Front perineal area, Buttocks, Right upper leg, Left upper leg, Right lower leg, Left lower leg     Bathing assist Assist Level: Minimal Assistance - Patient > 75%     Upper Body Dressing/Undressing Upper body dressing   What is the patient wearing?: Pull over shirt    Upper body assist Assist Level: Supervision/Verbal cueing    Lower Body Dressing/Undressing Lower body dressing      What is the patient wearing?: Pants     Lower body assist Assist for lower body dressing: Minimal Assistance - Patient > 75%     Toileting Toileting Toileting Activity did not occur (Clothing management and hygiene only): N/A (no void or bm)(foley)  Toileting assist Assist for toileting: Contact Guard/Touching assist     Transfers Chair/bed transfer  Transfers assist  Chair/bed transfer activity did not occur: Safety/medical concerns(limited by dizziness, chest pain)  Chair/bed transfer assist level: Minimal Assistance - Patient > 75% Chair/bed transfer assistive device:   Ambulation assist   Ambulation  activity did not occur: Safety/medical concerns(limited by dizziness, chest pain)  Assist level: Minimal Assistance - Patient > 75% Assistive device: No Device Max distance: 132'   Walk 10 feet activity   Assist  Walk 10 feet activity did not occur: Safety/medical concerns(limited by dizziness, chest pain)  Assist level: Minimal Assistance - Patient > 75% Assistive device: No Device   Walk 50 feet activity   Assist Walk 50 feet with 2 turns activity did not occur: Safety/medical concerns(limited by dizziness, chest  pain)  Assist level: Minimal Assistance - Patient > 75% Assistive device: No Device    Walk 150 feet activity   Assist Walk 150 feet activity did not occur: Safety/medical concerns         Walk 10 feet on uneven surface  activity   Assist Walk 10 feet on uneven surfaces activity did not occur: Safety/medical concerns(limited by dizziness, chest pain)         Wheelchair     Assist     Wheelchair activity did not occur: Safety/medical concerns(limited by dizziness, chest pain)         Wheelchair 50 feet with 2 turns activity    Assist    Wheelchair 50 feet with 2 turns activity did not occur: Safety/medical concerns(limited by dizziness, chest pain)       Wheelchair 150 feet activity     Assist  Wheelchair 150 feet activity did not occur: Safety/medical concerns(limited by dizziness, chest pain)       Blood pressure 127/85, pulse 94, temperature 98.3 F (36.8 C), resp. rate 18, height 5\' 6"  (1.676 m), weight 73.4 kg, SpO2 100 %.  1.  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              -patient may shower             -ELOS/Goals: 2-3 weeks minA , Cont CIR  PT, OT, SLP 2.  Antithrombotics: -DVT/anticoagulation:  Pharmaceutical: Other (comment)--Eliquis             -antiplatelet therapy: N/A 3. Pain Management: Fentanyl IV prior to hydrotherapy. Will add oxycodone prn for sacral pain.   3/13- pain doing better- con't regimen for now.   3/14- R shoulder pain is doing better   3/23- having toe pain- explained due to getting increased blood flow- will get better eventually, but likely worse first- prn pain meds 4. Mood: LCSW to follow for evaluation and support.              -antipsychotic agents: N/A 5. Neuropsych: This patient appears to be capable of making decisions on his own behalf. 6. Sacral decubitus/Skin/Wound Care- Was unstageable- initially, covered with eshar initially- looks deep Stage  III currently. : continue air mattress overlay.  Hydrotherapy 6 days a week. Tube feeds with additional protein supplement and vitamins to help promote wound healing.   3/13- have wet to dry on days not getting hydrotherapy.  3/17- plan to re-eval wound for treatment as of next week- cleaning up nicely, per my assessment and WOC  3/18- got pic of wound- a little slough and eschar left- but improving- con't wound care/hydrotherapy for a least 1 week.  3/22- stopped hydrotherapy Friday- d/c'd Fentanyl 7. Fluids/Electrolytes/Nutrition: Monitor I/O. Check lytes in am and weekly.  8. T2DM: Hgb A1c- 11.7--continue Levemir bid with SSI every 4 hours for elevated BS. Will consult dietician to start bolus tube feeds.    CBG (last 3)  Recent  Labs    04/06/19 0952 04/06/19 1316 04/06/19 1635  GLUCAP 80 177* 189*   Good am control will cont to  monitor prior to additional changes     3/18- DM team added Novolog 3 units- will monitor  3/19- DM coordinator- decreased levemir to 25 units on 3/20 and SSI to sensitive  3/22- BGs much improved- con't regimen  3/23- BGs variable today- TFs were held for OR 9. Dizziness: Will change Cardura to bedtime to avoid SE. Will monitor orthostatic Bps  3/14- BP on soft side and slightly tachycardic, likely due to breahting meds and overall deconditioning- con't to monitor 10. 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   3/23- improving R sided strength 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   3/11- will add Duonebs QID- not prn-   3/12- improved SOB- con't regimen- discussed with resp therapy.  3/14- con't Duonebs-TID   3/17- will try and wean O2 to off if possible since pt's O2 sats 99-100% via TC- will consult resp for assistance.    3/23- asked team to try off O2 first while in room; and then with movement, and expand.   12. Resting tachycardia: Due to deconditioning. No BB due to soft BP/orthostatic symptoms.  Monitor for now.    Vitals:   04/06/19 1605  04/06/19 1608  BP:  127/85  Pulse: 97 94  Resp: 18 18  Temp:  98.3 F (36.8 C)  SpO2: 98% 237%  mild  Diastolic elevation   6/28- BP doing well, but Pulse still 98 today on exam 13. Gangrenous changes bilateral feet: Will order off loading shoes. Continue to monitor.   Primarily affecting 1st and 2nd toes bilaterally - pedal and post tib pulses normal , likely small vessel infarcts   3/16- toes doing better- peels dry skin and looks like healing somewhat.   3/17- slowly improving- peeling dry skin.  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  14. Abnormal LFTs: Recheck labs in am.   3/10- mild elevation of ALT only- will con't to monitor 15. ABLA: Due to critical illness. Will check anemia panel.   3/10- looking better- con't treatment 16. Right Vocal fold immobility, dysphonia:s/p transnasal fiberoptic laryngoscopy by ENT on 3/9 but was aborted due to gagging and vomiting. ENT will re-attempt in a couple of days.   3/11- could hear a few words using PMV this AM- will con't to monitor  Pt ok with ENT re attempting laryngoscopy   3/16- will call ENT to get scope done for pt.  3/17- waiting for ENT   3/18- ENT scoped pt- going to OR next week- will arrange per ENT  3/22- don't know day- will find out from ENT.  3/23- injected vocal folds with Prolaryn R>L and kenalog on R due to being tethetered  18. Disposition: Patient lives with wife and 5 children aged 90-23 who will provide support upon discharge.    LOS: 14 days A FACE TO FACE EVALUATION WAS PERFORMED  Andrew Bruce 04/06/2019, 6:59 PM

## 2019-04-06 NOTE — Anesthesia Procedure Notes (Signed)
Procedure Name: Intubation Date/Time: 04/06/2019 7:36 AM Performed by: Gwenyth Allegra, CRNA Pre-anesthesia Checklist: Patient identified, Emergency Drugs available, Suction available, Patient being monitored and Timeout performed Patient Re-evaluated:Patient Re-evaluated prior to induction Oxygen Delivery Method: Circle system utilized Preoxygenation: Pre-oxygenation with 100% oxygen Induction Type: IV induction Tube size: 6.5 mm Number of attempts: 1 Airway Equipment and Method: Tracheostomy Placement Confirmation: positive ETCO2 and breath sounds checked- equal and bilateral Tube secured with: Tape

## 2019-04-06 NOTE — Progress Notes (Signed)
Occupational Therapy Session Note  Patient Details  Name: Andrew Bruce MRN: [id number] Date of Birth: Aug 28, 1965  Today's Date: 04/06/2019 OT Individual Time: 1135-1200 OT Individual Time Calculation (min): 25 min    Short Term Goals: Week 1:  OT Short Term Goal 1 (Week 1): Pt will tolerate out of bed for 2-3 hrs a day OT Short Term Goal 1 - Progress (Week 1): Met OT Short Term Goal 2 (Week 1): Pt will perform UB dressing wth mod A OT Short Term Goal 2 - Progress (Week 1): Met OT Short Term Goal 3 (Week 1): Pt will dress LB clothing (brief/ pants) with max A OT Short Term Goal 3 - Progress (Week 1): Met OT Short Term Goal 4 (Week 1): Pt will transfer to toilet with max A +1 OT Short Term Goal 4 - Progress (Week 1): Met  Skilled Therapeutic Interventions/Progress Updates:    1:1 Pt able to perform bed mobility to come to EOB  With supervision with bed rails. Pt was not able to don socks on his own. Able to don shoes with mi nA. Pt on RA ambulated to the bathroom with contact guard and performed toilet transfer and toileting with contact guard. Pt able to ambulated ~30 feet with contact guard with RW on RA. Pt sats dropped to 85% but when sat returned to 93%. Returned to room with contact guard and return to supine.   Therapy Documentation Precautions:  Precautions Precautions: Fall, Other (comment) Precaution Comments: PMSV at all times, PEG;  NPO Restrictions Weight Bearing Restrictions: No Other Position/Activity Restrictions: HOB elevated 30 degrees at all times   Pain: Pain Assessment Pain Scale: 0-10 Pain Score: 0-No pain   Therapy/Group: Individual Therapy  [name] [facility] 04/06/2019, 3:54 PM

## 2019-04-07 ENCOUNTER — Encounter (HOSPITAL_COMMUNITY): Payer: Self-pay | Admitting: Speech Pathology

## 2019-04-07 ENCOUNTER — Inpatient Hospital Stay (HOSPITAL_COMMUNITY): Payer: Self-pay

## 2019-04-07 ENCOUNTER — Inpatient Hospital Stay (HOSPITAL_COMMUNITY): Payer: Medicaid Other

## 2019-04-07 ENCOUNTER — Encounter: Payer: Self-pay | Admitting: *Deleted

## 2019-04-07 ENCOUNTER — Inpatient Hospital Stay (HOSPITAL_COMMUNITY): Payer: Self-pay | Admitting: Occupational Therapy

## 2019-04-07 LAB — GLUCOSE, CAPILLARY
Glucose-Capillary: 98 mg/dL (ref 70–99)
Glucose-Capillary: 127 mg/dL — ABNORMAL HIGH (ref 70–99)
Glucose-Capillary: 120 mg/dL — ABNORMAL HIGH (ref 70–99)
Glucose-Capillary: 100 mg/dL — ABNORMAL HIGH (ref 70–99)
Glucose-Capillary: 227 mg/dL — ABNORMAL HIGH (ref 70–99)
Glucose-Capillary: 244 mg/dL — ABNORMAL HIGH (ref 70–99)
Glucose-Capillary: 164 mg/dL — ABNORMAL HIGH (ref 70–99)

## 2019-04-07 MED ORDER — OSMOLITE 1.5 CAL PO LIQD
237.0000 mL | Freq: Every day | ORAL | Status: DC
  Administered 2019-04-08 – 2019-04-10 (×2): 237 mL
  Filled 2019-04-07 (×8): qty 237

## 2019-04-07 NOTE — Progress Notes (Signed)
Occupational Therapy Session Note  Patient Details  Name: Andrew Bruce MRN: [id number] Date of Birth: 05/28/1965  Today's Date: 04/07/2019 OT Individual Time: 1100-1200 OT Individual Time Calculation (min): 60 min    Short Term Goals: Week 1:  OT Short Term Goal 1 (Week 1): Pt will tolerate out of bed for 2-3 hrs a day OT Short Term Goal 1 - Progress (Week 1): Met OT Short Term Goal 2 (Week 1): Pt will perform UB dressing wth mod A OT Short Term Goal 2 - Progress (Week 1): Met OT Short Term Goal 3 (Week 1): Pt will dress LB clothing (brief/ pants) with max A OT Short Term Goal 3 - Progress (Week 1): Met OT Short Term Goal 4 (Week 1): Pt will transfer to toilet with max A +1 OT Short Term Goal 4 - Progress (Week 1): Met Week 2:  OT Short Term Goal 1 (Week 2): Pt will perform 3/3 toileting tasks wtih min  A OT Short Term Goal 2 (Week 2): Pt will perform bed mobility in prep for ADL with supervision OT Short Term Goal 3 (Week 2): Pt will stand to perform one grooming task at the sink with min A OT Short Term Goal 4 (Week 2): Pt will don LB clothing with min A  Skilled Therapeutic Interventions/Progress Updates:    1:1 Pt declined formal bathing today. Pt able to come to EOB with supervision with use of the bed rail. Pt able to thread and pull up pants with min A with extra time to get them over his toes. Pt did require A to don socks and shoes. Pt able to doff shirt and don clean paper one with setup and min A over trach site. PT able to tolerate RA with PMSV donned for the whole session with sats remaining above 94%!!!. PT able to transfer without AD with min A to the w/c. Transported to the gym. O2 monitor switched to left hand. Focus on right UE/ coordination including pinch grasp, in hand manipulation of small items, and manipulation of clothing fasteners (tying shoe, and different size buttons). Used small bolts/nuts, tan theraputty with HEP. PT continues to present with numbness/ decr  sensation in right hand.   Ambulated back to room ~40 feet with RW with min A on RA ostill maintaining Sats on RA. LEft resting in the bed.   Therapy Documentation Precautions:  Precautions Precautions: Fall, Other (comment) Precaution Comments: PMSV at all times, PEG;  NPO Restrictions Weight Bearing Restrictions: No Other Position/Activity Restrictions: HOB elevated 30 degrees at all times General:   Vital Signs: Therapy Vitals Temp: 98.8 F (37.1 C) Temp Source: Oral Pulse Rate: 86 Resp: 17 BP: 131/84 Patient Position (if appropriate): Lying Oxygen Therapy SpO2: 100 % O2 Device: Tracheostomy Collar O2 Flow Rate (L/min): 5 L/min FiO2 (%): 28 % Pain: No co pain in session   Therapy/Group: Individual Therapy  [name] [facility] 04/07/2019, 9:34 PM

## 2019-04-07 NOTE — Progress Notes (Signed)
Modified Barium Swallow Progress Note  Patient Details  Name: Andrew Bruce MRN: [id number] Date of Birth: Oct 13, 1965  Today's Date: 04/07/2019  Modified Barium Swallow completed.  Full report located under Chart Review in the Imaging Section.  Brief recommendations include the following:  Clinical Impression  Pt's swallow is much improved over previous studies.  He continues to present with functional oral phase across nectar thick liquids, thin liquids, puree, dysphagia 3, mixed consistency and whole pill in puree. He demonstrates timely swallow initiation, good hyolarngeal movement and with PMSV in place, he is able to protect his airway. With PMSV removed pt has moderate aspiration that is sensed but his cough was ineffective in clearing. He continues to present moderate pharyngoesophageal and UES dysfunction. He has incomplete epiglottic inversion that results in significant residue within the vallecula and pyriform sinuses. He continues to have decreased UES opening with bolus stasis in pharynx and below UES that backflows into pharynx and occasionally trace amount backflows into nasopharynx. He volitionally swallows multiple times that is effective in clearing all boluses the through UES. Despite this, pt is able to consistency protect is his airway thru multiple trials. Therefore recommend returning pt to PO intake with dysphagia 3 diet, nectar thick liquids, medicine crushed in puree and PMSV must be IN place. Also recommend continued trials of thin liquids at bedside with effortful swallow to help with epiglottic inversion. ST to continue following.   Swallow Evaluation Recommendations       SLP Diet Recommendations: Dysphagia 3 (Mech soft) solids;Nectar thick liquid   Liquid Administration via: Cup   Medication Administration: Crushed with puree   Supervision: Full supervision/cueing for compensatory strategies   Compensations: Minimize environmental distractions;Slow rate;Small  sips/bites;Multiple dry swallows after each bite/sip   Postural Changes: Seated upright at 90 degrees   Oral Care Recommendations: Oral care BID   Other Recommendations: Order thickener from pharmacy;Prohibited food (jello, ice cream, thin soups);Remove water pitcher;Place PMSV during PO intake    Darryl Willner 04/07/2019,3:49 PM

## 2019-04-07 NOTE — Progress Notes (Signed)
[place] PHYSICAL MEDICINE & REHABILITATION PROGRESS NOTE   Subjective/Complaints:  Pt reports toes still hurting- hasn't taken pain meds yet this AM.   Of note, didn't have O2 over PMV/trach this AM- sat machine wasn't working but pt appeared comfortable.   ROS:  Pt denies SOB, abd pain, CP, N/V/C/D, and vision changes   Objective:   No results found. Recent Labs    04/05/19 0547  WBC 6.8  HGB 11.4*  HCT 36.7*  PLT 482*   Recent Labs    04/05/19 0547  NA 136  K 4.1  CL 98  CO2 27  GLUCOSE 82  BUN 19  CREATININE 0.40*  CALCIUM 9.0    Intake/Output Summary (Last 24 hours) at 04/07/2019 4132 Last data filed at 04/07/2019 0818 Gross per 24 hour  Intake 337 ml  Output 1100 ml  Net -763 ml     Physical Exam: Vital Signs Blood pressure 116/75, pulse 86, temperature 98.1 F (36.7 C), resp. rate 18, height 5\' 6"  (1.676 m), weight 73.4 kg, SpO2 98 %.  Nursing note and vitals reviewed. General: laying in bed; supine; appears cold- NAD HEENT: trach in place- no secretions; PMV in place; O2 NOT over trach- breathing well Calm affect Heart: RRR Lungs:  CTA B/L completely today as well, good air movement Abdomen: (+) PEG; soft, NT, ND, (+)BS Extremities: Gangrenous 1st and 2nd toes bilaterally more extensive on the left - 1st toes still llook mainly black and developing some drainage around base of L 1st toe, but other toes doing better Motor 4- BUE and 3- B HF, 4- Knee ext , 3- ADF , PF Skin: sacral wound a little more shallow appearing  Assessment/Plan: 1. 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  Physiatrist is providing close team supervision and 24 hour management of active medical problems listed below.  Physiatrist and rehab team continue to assess barriers to discharge/monitor patient progress toward  functional and medical goals  Care Tool:  Bathing    Body parts bathed by patient: Right arm, Left arm, Chest, Abdomen, Face, Front perineal area, Buttocks, Right upper leg, Left upper leg, Right lower leg, Left lower leg   Body parts bathed by helper: Front perineal area, Buttocks, Right upper leg, Left upper leg, Right lower leg, Left lower leg     Bathing assist Assist Level: Minimal Assistance - Patient > 75%     Upper Body Dressing/Undressing Upper body dressing   What is the patient wearing?: Pull over shirt    Upper body assist Assist Level: Supervision/Verbal cueing    Lower Body Dressing/Undressing Lower body dressing      What is the patient wearing?: Pants     Lower body assist Assist for lower body dressing: Minimal Assistance - Patient > 75%     Toileting Toileting Toileting Activity did not occur (Clothing management and hygiene only): N/A (no void or bm)(foley)  Toileting assist Assist for toileting: Contact Guard/Touching assist     Transfers Chair/bed transfer  Transfers assist  Chair/bed transfer activity did not occur: Safety/medical concerns(limited by dizziness, chest pain)  Chair/bed transfer assist level: Minimal Assistance - Patient > 75% Chair/bed transfer assistive device:   Ambulation assist   Ambulation activity did not occur: Safety/medical concerns(limited by dizziness, chest pain)  Assist level: Minimal Assistance - Patient > 75% Assistive device: No Device  Max distance: 132'   Walk 10 feet activity   Assist  Walk 10 feet activity did not occur: Safety/medical concerns(limited by dizziness, chest pain)  Assist level: Minimal Assistance - Patient > 75% Assistive device: No Device   Walk 50 feet activity   Assist Walk 50 feet with 2 turns activity did not occur: Safety/medical concerns(limited by dizziness, chest pain)  Assist level: Minimal Assistance - Patient > 75% Assistive device:  No Device    Walk 150 feet activity   Assist Walk 150 feet activity did not occur: Safety/medical concerns         Walk 10 feet on uneven surface  activity   Assist Walk 10 feet on uneven surfaces activity did not occur: Safety/medical concerns(limited by dizziness, chest pain)         Wheelchair     Assist     Wheelchair activity did not occur: Safety/medical concerns(limited by dizziness, chest pain)         Wheelchair 50 feet with 2 turns activity    Assist    Wheelchair 50 feet with 2 turns activity did not occur: Safety/medical concerns(limited by dizziness, chest pain)       Wheelchair 150 feet activity     Assist  Wheelchair 150 feet activity did not occur: Safety/medical concerns(limited by dizziness, chest pain)       Blood pressure 116/75, pulse 86, temperature 98.1 F (36.7 C), resp. rate 18, height 5\' 6"  (1.676 m), weight 73.4 kg, SpO2 98 %.  1.  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              -patient may shower             -ELOS/Goals: 2-3 weeks minA , Cont CIR  PT, OT, SLP 2.  Antithrombotics: -DVT/anticoagulation:  Pharmaceutical: Other (comment)--Eliquis             -antiplatelet therapy: N/A 3. Pain Management: Fentanyl IV prior to hydrotherapy. Will add oxycodone prn for sacral pain.   3/23- having toe pain- explained due to getting increased blood flow- will get better eventually, but likely worse first- prn pain meds  3/24- off fentanyl since done with hydrotherapy-  4. Mood: LCSW to follow for evaluation and support.              -antipsychotic agents: N/A 5. Neuropsych: This patient appears to be capable of making decisions on his own behalf. 6. Sacral decubitus/Skin/Wound Care- Was unstageable- initially, covered with eshar initially- looks deep Stage III currently. : continue air mattress overlay.  Hydrotherapy 6 days a week. Tube feeds with additional protein  supplement and vitamins to help promote wound healing.   3/22- stopped hydrotherapy Friday- d/c'd Fentanyl  3/24- healing still- a little more shallow 7. Fluids/Electrolytes/Nutrition: Monitor I/O. Check lytes in am and weekly.  8. T2DM: Hgb A1c- 11.7--continue Levemir bid with SSI every 4 hours for elevated BS. Will consult dietician to start bolus tube feeds.    CBG (last 3)  Recent Labs    04/06/19 2047 04/06/19 2230 04/07/19 0634  GLUCAP 256* 158* 100*   Good am control will cont to  monitor prior to additional changes     3/18- DM team added Novolog 3 units- will monitor  3/19- DM coordinator- decreased levemir to 25 units on 3/20 and SSI to sensitive  3/24- overall good control- con't regimen 9. Dizziness: Will change Cardura to bedtime to avoid SE. Will monitor orthostatic Bps  3/14- BP on soft side and slightly tachycardic, likely due to breahting meds and overall deconditioning- con't to monitor  3/24- denies dizziness now 10. 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   3/23- improving R sided strength 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   3/11- will add Duonebs QID- not prn-   3/12- improved SOB- con't regimen- discussed with resp therapy.  3/14- con't Duonebs-TID   3/17- will try and wean O2 to off if possible since pt's O2 sats 99-100% via TC- will consult resp for assistance.    3/24- not wearing O2 over trach this AM- asked nursing to wean off O2.  12. Resting tachycardia: Due to deconditioning. No BB due to soft BP/orthostatic symptoms.  Monitor for now.    Vitals:   04/07/19 0553 04/07/19 0741  BP: 116/75   Pulse: 94 86  Resp:  18  Temp:    SpO2:  98%  mild  Diastolic elevation   3/22- BP doing well, but Pulse still 98 today on exam 13. Gangrenous changes bilateral feet: Will order off loading shoes. Continue to monitor.  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  14. Abnormal LFTs: Recheck labs in am.   3/10- mild elevation of ALT only- will con't to monitor 15. ABLA: Due to critical illness. Will check anemia panel.   3/10- looking better- con't treatment 16. Right Vocal fold immobility, dysphonia:s/p transnasal fiberoptic laryngoscopy by ENT on 3/9 but was aborted due to gagging and vomiting. ENT will re-attempt in a couple of days.   3/11- could hear a few words using PMV this AM- will con't to monitor  Pt ok with ENT re attempting laryngoscopy   3/16- will call ENT to get scope done for pt.  3/17- waiting for ENT   3/18- ENT scoped pt- going to [place] next [place]- will arrange per ENT  3/22- don't know day- will find out from ENT.  3/23- injected vocal folds with Prolaryn R>L and kenalog on R due to being tethetered  3/24- speak MUCH more inteeligible, able to hear- MBS today at 9am- to see if can eat  18. Disposition: Patient lives with wife and 5 children aged 46-23 who will provide support upon discharge.    LOS: 15 days A FACE TO FACE EVALUATION WAS PERFORMED  Sohail Capraro 04/07/2019, 9:22 AM

## 2019-04-07 NOTE — Progress Notes (Signed)
Physical Therapy Weekly Progress Note  Patient Details  Name: Andrew Bruce MRN: [id number] Date of Birth: January 16, 1965  Beginning of progress report period: March 31, 2019 End of progress report period: April 07, 2019  Today's Date: 04/07/2019 PT Individual Time: 1300-1340, 1350-1400, and 6967-8938 PT Individual Time Calculation (min): 40 min, 10 min, and 20 min   Patient has met 1 of 3 short term goals.  Patient is progressing well with PT. He currently requires min A-supervision for bed mobility, CGA for transfers, gait >100 ft, and 4-6" steps with B rails with Darco post-op shoes donned, and performs w/c mobility 120 feet and static standing balance with supervision. He has initiated seated activities and ambulating short distances off oxygen while maintaining O2 >90%, will continue to progress mobility off O2 as tolerated.  Patient continues to demonstrate the following deficits muscle weakness, decreased cardiorespiratoy endurance and decreased oxygen support, abnormal tone, unbalanced muscle activation and decreased coordination and decreased memory and therefore will continue to benefit from skilled PT intervention to increase functional independence with mobility.  Patient progressing toward long term goals..  Plan of care revisions: upgraded patient's goals this week due to patient's progress. (please see Care Plan for updates).  PT Short Term Goals Week 2:  PT Short Term Goal 1 (Week 2): Patient will consistently perform all bed mobility with supervision without use of bed rails. PT Short Term Goal 1 - Progress (Week 2): Progressing toward goal PT Short Term Goal 2 (Week 2): Patient will perform basic transfers with close supervision using LRAD. PT Short Term Goal 2 - Progress (Week 2): Progressing toward goal PT Short Term Goal 3 (Week 2): Patient will ambulate >25 ft using LRAD. PT Short Term Goal 3 - Progress (Week 2): Met PT Short Term Goal 4 (Week 2): Patient will tolerate  sitting OOB >1 hour. PT Short Term Goal 4 - Progress (Week 2): Progressing toward goal Week 3:  PT Short Term Goal 1 (Week 3): Patient will perform bed mobility with supervision without use of bed rails. PT Short Term Goal 2 (Week 3): Patient will perform basic transfers with supervision using LRAD. PT Short Term Goal 3 (Week 3): Patient will sit OOB at least 30-60 min 2x per day for improved sitting tolerance. PT Short Term Goal 4 (Week 3): Patient will perform 2 steps with 1 rail with min A.  Skilled Therapeutic Interventions/Progress Updates:     Session 1 and Session 2: Patient in bed on 28% FiO2 with PMSV in place upon PT arrival. Patient alert and agreeable to PT session. Patient reported 6/10 L toe pain during session, RN made aware and reported that he would be due for pain medication in 1 hour. PT provided repositioning, rest breaks, and distraction as pain interventions throughout session. Place patient on RA with PMSV throughout session. Patient with increased vocalization and volume with his speech today. Interpreter arrived at beginning of session and remained throughout.  Therapeutic Activity: Bed Mobility: Patient performed supine to sit with supervision with min use of the bed rail. Provided verbal cues for rolling to R side and pushing to sit up with B UEs. PT donned B XXXL non-skid socks and Darco post op shoes with max-total A to protect patient's toes and manage pain. Transfers: Patient performed sit to/from stand x3 with CGA. Provided verbal cues for forward weight shift and heel weight bearing. Patient stood to void at the toilet during session. Performed clothing management independently with CGA for balance.   Gait  Training:  Patient ambulated to/from the bathroom and ~125 feet and ~70 feet in the hallway without an AD with CGA. Ambulated with step-through gait pattern, heel weight bearing, decreased R step height, decreased B step length, and decreased R DF with fatigue.  Provided verbal cues for increased R step height. SPO2 dropped to 89% on RA after longest ambulation, but recovered to >94% in <1 min with pursed lip breathing. Otherwise, SPO2 remained >92% with activity.   Wheelchair Mobility:  Patient propelled a manual wheelchair 55 feet with supervision using B UEs. Provided verbal cues for turning technique and avoiding objects.  [name], SLP arrived during session and spoke with patient about his new diet restrictions and positioning for eating. Patient missed 10 min of skilled PT in exchange for 10 min of skilled ST during session for patient education.   Patient in [place] w/c on 28% FiO2 at end of session with breaks locked, seat belt alarm set, music playing, and all needs within reach. Educated patient on benefits of sitting OOB to improve lung function and swallowing. Reinforced education from ST to eat in sitting. Patient agreeable to sit in TIS w/c slightly tilted back for sacral comfort x1 hour when PT was scheduled to return.  Session 2: Patient in TIS w/c on 28% FiO2 with PMSV in place with his wife in the room upon PT arrival. Patient alert and agreeable to PT session. Patient reported 5-6/10 L toe pain during session, RN made aware. PT provided repositioning, rest breaks, and distraction as pain interventions throughout session. No interpreter present and all [name] devices in use during session. Patient's wife stated that she speaks english and translated necessary cues to patient during session.  Therapeutic Activity: Bed Mobility: Patient performed sit to supine with supervision without use of bed rails.  Transfers: Patient performed sit to/from stand x1 and ambulated 4-5 steps to the bed with CGA in non-skid socks. Provided verbal cues for erect posture and to focus on balance rather than reaching for objects to work on balance with gait and functional mobility.  Updated patient's wife on new diet and having staff assess meals brought  in from home before having the patient eat them. Patient's wife in agreement. Also updated her on d/c date and PT goals during session. Will ask nursing staff to reinforce education with interpreter or [name] device present.   Patient reported increased fatigue once in bed and requested to continue with therapy tomorrow. Patient missed 10 min of skilled PT due to fatigue. Patient in bed with his wife at bedsideat end of session with breaks locked, bed alarm set, and all needs within reach.    Therapy Documentation Precautions:  Precautions Precautions: Fall, Other (comment) Precaution Comments: PMSV at all times, PEG;  NPO Restrictions Weight Bearing Restrictions: No Other Position/Activity Restrictions: HOB elevated 30 degrees at all times General: PT Amount of Missed Time (min): 10 Minutes PT Missed Treatment Reason: Patient fatigue   Therapy/Group: Individual Therapy  Banyan Goodchild L Amaka Gluth PT, DPT  04/07/2019, 5:25 PM

## 2019-04-07 NOTE — Progress Notes (Signed)
Speech Language Pathology Weekly Progress and Session Note  Patient Details  Name: Sirius Woodford MRN: [id number] Date of Birth: 05/03/1965  Beginning of progress report period: April 01, 2019 End of progress report period: April 07, 2019  Today's Date: 04/07/2019 SLP Individual Time: 1340-1350 SLP Individual Time Calculation (min): 10 min  Short Term Goals: Week 2: SLP Short Term Goal 1 (Week 2): Pt will complete 5 repetitions with EMST 150 device set at 30 cmH20 and IMST device set at 10 cmH20  with mod A verbal instruction. SLP Short Term Goal 1 - Progress (Week 2): Not met SLP Short Term Goal 2 (Week 2): Pt will demonstrate donning and doffing of PMSV with mod A multimodal cues SLP Short Term Goal 2 - Progress (Week 2): Progressing toward goal SLP Short Term Goal 3 (Week 2): Pt will demonstrate knowledge of vocal cord function/impairment by answering yes/no questions accuracy in 7 out of 10 opportunity with Min A cues. SLP Short Term Goal 3 - Progress (Week 2): Met SLP Short Term Goal 4 (Week 2): Pt will communicate basic wants and needs with supervision level questions for verification of message. SLP Short Term Goal 4 - Progress (Week 2): Met SLP Short Term Goal 5 (Week 2): Pt will consume ice chips at bedside to rehabilitate musculature and to increase ROM. SLP Short Term Goal 5 - Progress (Week 2): Met    New Short Term Goals: Week 3: SLP Short Term Goal 1 (Week 3): Pt will consume current diet with minimal overt s/s of dysphagia or aspiration and supervision cues for use of compensatory strategies. SLP Short Term Goal 2 (Week 3): Pt will consume trials of thin liquids with minimal overt s/s of aspiration and no decline in respiratory status to demonstrate readiness for repeat instrumental study. SLP Short Term Goal 3 (Week 3): Pt will phonate to achieve ~ 75% intelligibility at the phrase level with Min A cues. SLP Short Term Goal 4 (Week 3): Pt will complete 3 sets of 10  pharyngeal strengthening exercises (effortful swallow) with supervision cues. SLP Short Term Goal 5 (Week 3): With Min A multimodal cues, pt will donn and doff PMSV correctly in 5 out of 10 opportunities. SLP Short Term Goal 6 (Week 3): Given Min A cues, pt will complete 5 repetitions with EMST 150 device set at 30 cmH20 and  5 repeitions with IMST device set at 10 cmH20.  Weekly Progress Updates: Pt has made great progress over the course of the reporting period. He proceeded with ENT consult and procedure (3/23) as well as participated in [facility] (3/24). Post ENT procedure pt is able to achieve phonation with ease and increase speech intelligibility to ~ 90% at the word level and ~ 75% at the phrase level. Additionally, he demonstrated the ability to safety protect his airway when consuming dysphagia 2 diet and nectar thick liquids and medicine crushed in puree. Plan of care explained to pt and he voices understanding. Skilled ST continues to be required to target dysphagia and voice disorder.      Intensity: Minumum of 1-2 x/day, 30 to 90 minutes Frequency: 3 to 5 out of 7 days Duration/Length of Stay: 4/13 Treatment/Interventions: Patient/family education;Dysphagia/aspiration precaution training;Therapeutic Activities   Daily Session  Skilled Therapeutic Interventions: Skilled treatment session targeted education with pt on results of his MBS, rationale for dysphagia 3 diet with nectar thick liquids and this Probation officer provided education on need for double swallows, PMSV MUST BE IN PLACE for all PO intake,  he needs to be out of bed and in wheelchair for meals and nursing will be present to help him and also to monitor for any s/s of dysphagia or aspiration. Pt voiced understanding. Subjectively his ability to phonate is much improved and as a result his speech intelligibility is improved to ~ 90% at the word level. Pt handed off to PT.       General    Pain Pain Assessment Pain Scale: Faces Faces  Pain Scale: Hurts little more Pain Type: Acute pain Pain Location: Generalized  Therapy/Group: Individual Therapy  Ashanty Coltrane 04/07/2019, 2:14 PM

## 2019-04-07 NOTE — Progress Notes (Signed)
   Subjective:    Patient ID: Andrew Bruce, male    DOB: 1965-07-20, 54 y.o.   MRN: [id number]  HPI History and discussion via video interpreter.  Doing well with some voice improvement.  Had modified barium swallow today and has been advanced to a dysphagia 2 diet with nectar-thick liquids.  Review of Systems     Objective:   Physical Exam AF VSS Alert, NAD #6 cuffless Shiley in place with Passy-Muir valve Improved but breathy voice    Assessment & Plan:  Dysphonia, dysphagia, left vocal fold paresis, right vocal fold immobility  We discussed his surgical findings and interventions and I discussed details with his rehab provider, Rinaldo Cloud.  His voice and swallow have improved somewhat.  I described the challenge of working with bilateral vocal fold immobility in terms of balancing improvement in voice and swallow with maintaining airway patency.  His best hope for return of full function will be return of left vocal fold mobility with time.  This could take months.  For now, I recommended advancing his diet safely, as is being done, and proceeding with trach decannulation protocol.

## 2019-04-07 NOTE — Progress Notes (Signed)
Inpatient Diabetes Program Recommendations  AACE/ADA: New Consensus Statement on Inpatient Glycemic Control (2015)  Target Ranges:  Prepandial:   less than 140 mg/dL      Peak postprandial:   less than 180 mg/dL (1-2 hours)      Critically ill patients:  140 - 180 mg/dL   Lab Results  Component Value Date   GLUCAP 227 (H) 04/07/2019   HGBA1C 11.7 (H) 02/17/2019    Review of Glycemic Control  Diabetes history: DM 2  Current orders for Inpatient glycemic control:  Levemir 20 units bid Novolog 0-9 units 5x/day  Osmolite bolus feeds 5x/day  Inpatient Diabetes Program Recommendations:    Labile glucose trends, ok for pt to be slightly higher than goal to prevent hypoglycemia.  Could also decrease basal in order to add bolus feed coverage safely in the future. Will monitor trends for now to determine consistent patterns.  Thanks,  Christena Deem RN, MSN, BC-ADM Inpatient Diabetes Coordinator Team Pager [phone] (8a-5p)

## 2019-04-07 NOTE — Progress Notes (Signed)
Pt off floor for 12 pm trach check.

## 2019-04-08 ENCOUNTER — Inpatient Hospital Stay (HOSPITAL_COMMUNITY): Payer: Self-pay | Admitting: Occupational Therapy

## 2019-04-08 ENCOUNTER — Inpatient Hospital Stay (HOSPITAL_COMMUNITY): Payer: Self-pay

## 2019-04-08 ENCOUNTER — Inpatient Hospital Stay (HOSPITAL_COMMUNITY): Payer: Self-pay | Admitting: Speech Pathology

## 2019-04-08 LAB — GLUCOSE, CAPILLARY
Glucose-Capillary: 99 mg/dL (ref 70–99)
Glucose-Capillary: 96 mg/dL (ref 70–99)
Glucose-Capillary: 116 mg/dL — ABNORMAL HIGH (ref 70–99)
Glucose-Capillary: 156 mg/dL — ABNORMAL HIGH (ref 70–99)
Glucose-Capillary: 106 mg/dL — ABNORMAL HIGH (ref 70–99)

## 2019-04-08 MED ORDER — JUVEN PO PACK
1.0000 | PACK | Freq: Two times a day (BID) | ORAL | Status: DC
  Administered 2019-04-08 – 2019-04-19 (×22): 1
  Filled 2019-04-08 (×24): qty 1

## 2019-04-08 NOTE — Progress Notes (Signed)
Physical Therapy Session Note  Patient Details  Name: Andrew Bruce MRN: [id number] Date of Birth: Jun 15, 1965  Today's Date: 04/08/2019 PT Individual Time: 1102-1205 PT Individual Time Calculation (min): 63 min   Short Term Goals: Week 3:  PT Short Term Goal 1 (Week 3): Patient will perform bed mobility with supervision without use of bed rails. PT Short Term Goal 2 (Week 3): Patient will perform basic transfers with supervision using LRAD. PT Short Term Goal 3 (Week 3): Patient will sit OOB at least 30-60 min 2x per day for improved sitting tolerance. PT Short Term Goal 4 (Week 3): Patient will perform 2 steps with 1 rail with min A.  Skilled Therapeutic Interventions/Progress Updates:     Patient in bed with trach capped on RA upon PT arrival. Patient alert and agreeable to PT session. Patient reported 5/10 L toe pain during session, RN made aware. PT provided repositioning, rest breaks, and distraction as pain interventions throughout session.   Therapeutic Activity: Bed Mobility: Patient performed supine to/from sit with supervision in a flat bed without use of bed rails. Provided verbal cues for pushing through UEs to come to sitting. PT donned B non-skid socks and Darco post-op shoes with max A for time management and protection of patient's toes.  Transfers: Patient performed sit to/from stand x6 with CGA-close supervision. Provided verbal cues for reaching back to sit for controlled descent and forward weight shift to stand.  Gait Training:  Patient ambulated 125 feet x2 using RW due to decreased endurance with CGA-close supervision. He also ambulated ~40 feet, ~12 feet to/from the bathroom, and 5-10 feet in the room without an AD. Ambulated with decreased weight shift L, decreased R foot clearance, worse with fatigue, and decreased step length. Provided verbal cues for heel weight bearing and increased R step height. Provided tactile cues for L weight shift.  Neuromuscular  Re-ed: Patient performed the following functional activities with focus on motor control and balance: -sit to/from stands with UE support on thighs for increased LE motor control x2, noted patient with very wide BOS to complete stand -sit to/from stands with UE support on thighs with a ball between his thighs to decrease BOS with activity x5, and x1 without the ball with improved BOS -standing at the sink to rinse his mouth and brush the top of his mouth due to sensation of food being stuck there. Patient maintained balance without UE support throughout tasks with CGA-close supervision 2x2-3 min.   Wound care nurse came in to inspect patient's sacral wound at end of session. Set patient up on his side with min cues then returned him to lying in supine with B non-skid socks doffed. Noted very small amount of serosanguinous drainage from L third toe. Cleaned with gauze and made RN aware.   Patient in bed on RA at end of session with breaks locked, bed alarm set, and all needs within reach. SPO2>92% on RA throughout session.   Therapy Documentation Precautions:  Precautions Precautions: Fall, Other (comment) Precaution Comments: PMSV at all times, PEG;  NPO Restrictions Weight Bearing Restrictions: No Other Position/Activity Restrictions: HOB elevated 30 degrees at all times    Therapy/Group: Individual Therapy  Shellby Schlink L Davia Smyre PT, DPT  04/08/2019, 5:24 PM

## 2019-04-08 NOTE — Consult Note (Signed)
WOC Nurse wound follow up Patient receiving care in [facility] 4M13.  Interpreter in room. Wound type: stage 3 coccyx wound Measurement: 4.2 cm x 2.2 cm x 1.2 cm Wound bed: slick pink Drainage (amount, consistency, odor) serosanginous on foam dressing Periwound: intact Dressing procedure/placement/frequency: PA, P. Love has orders in for santyl and saline moistened gauze. Monitor the wound area(s) for worsening of condition such as: Signs/symptoms of infection,  Increase in size,  Development of or worsening of odor, Development of pain, or increased pain at the affected locations.  Notify the medical team if any of these develop. Helmut Muster, RN, MSN, CWOCN, CNS-BC, pager [phone]

## 2019-04-08 NOTE — Progress Notes (Signed)
Occupational Therapy Weekly Progress Note  Patient Details  Name: Andrew Bruce MRN: [id number] Date of Birth: 03-16-1965  Beginning of progress report period: April 02, 2019 End of progress report period: April 08, 2019  Today's Date: 04/08/2019 OT Individual Time: 0900-1000 OT Individual Time Calculation (min): 60 min    Patient has met 4 of 4 short term goals.  Patient continues to make gains in balance, endurance and functional use of right side with increased ability to perform self care and mobility tasks.  Patient continues to demonstrate the following deficits: muscle weakness, decreased cardiorespiratoy endurance, impaired timing and sequencing, decreased coordination and decreased motor planning and decreased standing balance and decreased postural control and therefore will continue to benefit from skilled OT intervention to enhance overall performance with BADL and iADL.  Patient progressing toward long term goals..  Continue plan of care.  OT Short Term Goals Week 2:  OT Short Term Goal 1 (Week 2): Pt will perform 3/3 toileting tasks wtih min  A OT Short Term Goal 1 - Progress (Week 2): Met OT Short Term Goal 2 (Week 2): Pt will perform bed mobility in prep for ADL with supervision OT Short Term Goal 2 - Progress (Week 2): Met OT Short Term Goal 3 (Week 2): Pt will stand to perform one grooming task at the sink with min A OT Short Term Goal 3 - Progress (Week 2): Met OT Short Term Goal 4 (Week 2): Pt will don LB clothing with min A OT Short Term Goal 4 - Progress (Week 2): Met Week 3:  OT Short Term Goal 1 (Week 3): patient will complete bathing and dressing CS/set up seated OT Short Term Goal 2 (Week 3): patient will complete functional transfers and ambulation with LRAD CS OT Short Term Goal 3 (Week 3): patient will improve standing tolerance to 10 minutes with light activity  Skilled Therapeutic Interventions/Progress Updates:    Patient in bed, alert and requests a  shower today.  Bed mobility performed with CS.  Mod A to donn post op shoes.  Sit to stand and ambulation without AD to bathroom/shower seat with CGA.  He performs shower seated with hand held shower and long handled sponge with CS (trach cover and IV covered).  Dressing completed seated on chair - min a for underwear over left foot, CS for pants.  CG A for CM in stance.  OH shirt set up.  Oral care with suction toothbrush CS and set up.   Ambulation with RW to/from therapy gym with CGA.  Completed seated UB conditioning activities with focus on end range OH reach, ER and scapular mobility/strength.  Returned to bed at close of session with CGA.  Bed alarm set, humidity at trach collar, pulse ox monitor and call bell in reach.    Therapy Documentation Precautions:  Precautions Precautions: Fall, Other (comment) Precaution Comments: PMSV at all times, PEG;  NPO Restrictions Weight Bearing Restrictions: No Other Position/Activity Restrictions: HOB elevated 30 degrees at all times General:   Vital Signs: Therapy Vitals Temp: 97.9 F (36.6 C) Pulse Rate: 99 Resp: 18 BP: 113/90 Patient Position (if appropriate): Lying Oxygen Therapy O2 Device: Room Air FiO2 (%): 96 %   Therapy/Group: Individual Therapy  Carlos Levering 04/08/2019, 3:57 PM

## 2019-04-08 NOTE — Progress Notes (Signed)
Speech Language Pathology Daily Session Note  Patient Details  Name: Andrew Bruce MRN: [id number] Date of Birth: 10-15-1965  Today's Date: 04/08/2019 SLP Individual Time: 1300-1400 SLP Individual Time Calculation (min): 60 min  Short Term Goals: Week 3: SLP Short Term Goal 1 (Week 3): Pt will consume current diet with minimal overt s/s of dysphagia or aspiration and supervision cues for use of compensatory strategies. SLP Short Term Goal 2 (Week 3): Pt will consume trials of thin liquids with minimal overt s/s of aspiration and no decline in respiratory status to demonstrate readiness for repeat instrumental study. SLP Short Term Goal 3 (Week 3): Pt will phonate to achieve ~ 75% intelligibility at the phrase level with Min A cues. SLP Short Term Goal 4 (Week 3): Pt will complete 3 sets of 10 pharyngeal strengthening exercises (effortful swallow) with supervision cues. SLP Short Term Goal 5 (Week 3): With Min A multimodal cues, pt will donn and doff PMSV correctly in 5 out of 10 opportunities. SLP Short Term Goal 6 (Week 3): Given Min A cues, pt will complete 5 repetitions with EMST 150 device set at 30 cmH20 and  5 repeitions with IMST device set at 10 cmH20.  Skilled Therapeutic Interventions:   Skilled treatment session targeted pt's dysphagia goals and education regarding cap on trach. SLP facilitated session by providing skilled observation of pt consuming dysphagia 3 lunch with nectar thick liquids. Pt consumed without overt s/s of dysphagia or aspiration. Pt's trach was capped today. He phonation is much improved and speech intelligibility is ~ 90%at the conversation level. He indicated that he doesn't understand this process. Information/educaiton provided. Pt requested further explanation at next session.   Since pt has had made greater than expected progress over the last 2 days, SLP specifically reviewed pt's MBS images and will target consumption of thin liquids in next session.    Pain    Therapy/Group: Individual Therapy  Lakesia Dahle 04/08/2019, 4:15 PM

## 2019-04-08 NOTE — Progress Notes (Signed)
[place] PHYSICAL MEDICINE & REHABILITATION PROGRESS NOTE   Subjective/Complaints:  Pt reports was NPO for 4 months- enjoying breakfast this AM.  Says toes hurt a little, but took meds.     ROS:   Pt denies SOB, abd pain, CP, N/V/C/D, and vision changes   Objective:   No results found. No results for input(s): WBC, HGB, HCT, PLT in the last 72 hours. No results for input(s): NA, K, CL, CO2, GLUCOSE, BUN, CREATININE, CALCIUM in the last 72 hours.  Intake/Output Summary (Last 24 hours) at 04/08/2019 0914 Last data filed at 04/08/2019 0700 Gross per 24 hour  Intake 360 ml  Output 750 ml  Net -390 ml     Physical Exam: Vital Signs Blood pressure (!) 148/100, pulse 91, temperature 98.9 F (37.2 C), temperature source Oral, resp. rate 18, height 5\' 6"  (1.676 m), weight 69 kg, SpO2 95 %.  Nursing note and vitals reviewed. General: sitting up EOB, enjoying breakfast- ON D3 with thins, CNA at bedside, NAD HEENT: trach in place; no secretions; O2 not over trach- wearing PMV- sats 94% pulse 107 (since eating) Bright affect Heart: tachycardic; regular rhythm Lungs:  CTA B/L- great air movement Abdomen: (+) PEG; soft, NT, ND,  (+)hyperactive BS since eating Extremities: Gangrenous 1st and 2nd toes bilaterally more extensive on the left - 1st toes still llook mainly black and developing some drainage around base of L 1st toe, L toes the same but other toes looking better daily.  Motor 4- BUE and 3- B HF, 4- Knee ext , 3- ADF , PF Skin: sacral wound a little more shallow appearing  Assessment/Plan: 1. 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  Physiatrist is providing close team supervision and 24 hour management of active medical problems listed below.  Physiatrist and rehab team continue to assess barriers to discharge/monitor  patient progress toward functional and medical goals  Care Tool:  Bathing    Body parts bathed by patient: Right arm, Left arm, Chest, Abdomen, Face, Front perineal area, Buttocks, Right upper leg, Left upper leg, Right lower leg, Left lower leg   Body parts bathed by helper: Front perineal area, Buttocks, Right upper leg, Left upper leg, Right lower leg, Left lower leg     Bathing assist Assist Level: Minimal Assistance - Patient > 75%     Upper Body Dressing/Undressing Upper body dressing   What is the patient wearing?: Pull over shirt    Upper body assist Assist Level: Supervision/Verbal cueing    Lower Body Dressing/Undressing Lower body dressing      What is the patient wearing?: Pants     Lower body assist Assist for lower body dressing: Minimal Assistance - Patient > 75%     Toileting Toileting Toileting Activity did not occur (Clothing management and hygiene only): N/A (no void or bm)(foley)  Toileting assist Assist for toileting: Contact Guard/Touching assist     Transfers Chair/bed transfer  Transfers assist  Chair/bed transfer activity did not occur: Safety/medical concerns(limited by dizziness, chest pain)  Chair/bed transfer assist level: Minimal Assistance - Patient > 75% Chair/bed transfer assistive device:   Ambulation assist   Ambulation activity did not occur: Safety/medical concerns(limited by dizziness, chest pain)  Assist level: Minimal Assistance - Patient > 75% Assistive device: No Device Max distance: 132'   Walk 10 feet activity  Assist  Walk 10 feet activity did not occur: Safety/medical concerns(limited by dizziness, chest pain)  Assist level: Minimal Assistance - Patient > 75% Assistive device: No Device   Walk 50 feet activity   Assist Walk 50 feet with 2 turns activity did not occur: Safety/medical concerns(limited by dizziness, chest pain)  Assist level: Minimal Assistance - Patient >  75% Assistive device: No Device    Walk 150 feet activity   Assist Walk 150 feet activity did not occur: Safety/medical concerns         Walk 10 feet on uneven surface  activity   Assist Walk 10 feet on uneven surfaces activity did not occur: Safety/medical concerns(limited by dizziness, chest pain)         Wheelchair     Assist     Wheelchair activity did not occur: Safety/medical concerns(limited by dizziness, chest pain)         Wheelchair 50 feet with 2 turns activity    Assist    Wheelchair 50 feet with 2 turns activity did not occur: Safety/medical concerns(limited by dizziness, chest pain)       Wheelchair 150 feet activity     Assist  Wheelchair 150 feet activity did not occur: Safety/medical concerns(limited by dizziness, chest pain)       Blood pressure (!) 148/100, pulse 91, temperature 98.9 F (37.2 C), temperature source Oral, resp. rate 18, height 5\' 6"  (1.676 m), weight 69 kg, SpO2 95 %.  1.  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              -patient may shower             -ELOS/Goals: 2-3 weeks minA , Cont CIR  PT, OT, SLP 2.  Antithrombotics: -DVT/anticoagulation:  Pharmaceutical: Other (comment)--Eliquis             -antiplatelet therapy: N/A 3. Pain Management: Fentanyl IV prior to hydrotherapy. Will add oxycodone prn for sacral pain.   3/23- having toe pain- explained due to getting increased blood flow- will get better eventually, but likely worse first- prn pain meds  3/24- off fentanyl since done with hydrotherapy-  3/25- took meds- toes only hurt a little  4. Mood: LCSW to follow for evaluation and support.              -antipsychotic agents: N/A 5. Neuropsych: This patient appears to be capable of making decisions on his own behalf. 6. Sacral decubitus/Skin/Wound Care- Was unstageable- initially, covered with eshar initially- looks deep Stage III currently. : continue  air mattress overlay.  Hydrotherapy 6 days a week. Tube feeds with additional protein supplement and vitamins to help promote wound healing.   3/22- stopped hydrotherapy Friday- d/c'd Fentanyl  3/24- healing still- a little more shallow 7. Fluids/Electrolytes/Nutrition: Monitor I/O. Check lytes in am and weekly.  8. T2DM: Hgb A1c- 11.7--continue Levemir bid with SSI every 4 hours for elevated BS. Will consult dietician to start bolus tube feeds.    CBG (last 3)  Recent Labs    04/07/19 1201 04/07/19 1658 04/08/19 0539  GLUCAP 244* 164* 96   Good am control will cont to  monitor prior to additional changes     3/18- DM team added Novolog 3 units- will monitor  3/19- DM coordinator- decreased levemir to 25 units on 3/20 and SSI to sensitive  3/25- variable control today 96-244- since just started ot eat- might require more titration with now eating real  food 9. Dizziness: Will change Cardura to bedtime to avoid SE. Will monitor orthostatic Bps  3/14- BP on soft side and slightly tachycardic, likely due to breahting meds and overall deconditioning- con't to monitor  3/24- denies dizziness now 10. 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   3/23- improving R sided strength 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   3/11- will add Duonebs QID- not prn-   3/12- improved SOB- con't regimen- discussed with resp therapy.  3/14- con't Duonebs-TID   3/17- will try and wean O2 to off if possible since pt's O2 sats 99-100% via TC- will consult resp for assistance.    3/24- not wearing O2 over trach this AM- asked nursing to wean off O2.   3/25- will plug pt and see if can decannulate next week, if tolerates plugging.  12. Resting tachycardia: Due to deconditioning. No BB due to soft BP/orthostatic symptoms.  Monitor for now.    Vitals:   04/08/19 0513 04/08/19 0748  BP: (!) 148/100   Pulse: 92 91  Resp: 20 18  Temp: 98.9 F (37.2 C)   SpO2: 99% 12%  mild  Diastolic elevation   2/48- BP doing  well, but Pulse still 98 today on exam  3/25- BP elevated for first time this AM- will monitor- pulse 107 when saw, since eating.  13. Gangrenous changes bilateral feet: Will order off loading shoes. Continue to monitor.  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   3/25- except L 1st toe, all healing more daily.  14. Abnormal LFTs: Recheck labs in am.   3/10- mild elevation of ALT only- will con't to monitor 15. ABLA: Due to critical illness. Will check anemia panel.   3/10- looking better- con't treatment 16. Right Vocal fold immobility, dysphonia:s/p transnasal fiberoptic laryngoscopy by ENT on 3/9 but was aborted due to gagging and vomiting. ENT will re-attempt in a couple of days.   3/11- could hear a few words using PMV this AM- will con't to monitor  Pt ok with ENT re attempting laryngoscopy   3/16- will call ENT to get scope done for pt.  3/17- waiting for ENT   3/18- ENT scoped pt- going to [place] next week- will arrange per ENT  3/22- don't know day- will find out from ENT.  3/23- injected vocal folds with Prolaryn R>L and kenalog on R due to being tethetered  3/24- speak MUCH more inteeligible, able to hear- MBS today at 9am- to see if can eat  3/25- is able to eat! D3 with thins- started yesterday afternoon-   18. Disposition: Patient lives with wife and 5 children aged 64-23 who will provide support upon discharge.    LOS: 16 days A FACE TO FACE EVALUATION WAS PERFORMED  Lisseth Brazeau 04/08/2019, 9:14 AM

## 2019-04-08 NOTE — Progress Notes (Signed)
Patient capped per order. Patient tolerating well at this time. Vitals are stable. RN aware. RT will continue to monitor.

## 2019-04-08 NOTE — Progress Notes (Signed)
Nutrition Follow-up  **RD working remotely**  DOCUMENTATION CODES:   Not applicable  INTERVENTION:  -Juven BID -Vital Cuisine shake TID, each supplement provides 500 kcal and 22 grams of protein  -Calorie Count per MD  Continue: - Osmolite 1.5 cal daily -Free water per MD/PA, currently QID  -MVI daily  NUTRITION DIAGNOSIS:   Increased nutrient needs related to wound healing as evidenced by estimated needs.   GOAL:   Patient will meet greater than or equal to 90% of their needs  MONITOR:   PO intake, Labs, Weight trends, Skin, Supplement acceptance  REASON FOR ASSESSMENT:   Consult Enteral/tube feeding initiation and management  ASSESSMENT:   Pt was originally admitted on 02/06/19 after 10 day history of Covid 19 and progressive shortness of breath x 48  Hours. Pt admitted to CIR on 03/23/19.  1/23 Admit 1/24 Intubated 1/25 B/L chest tubes placed 2/02 CT head: multiple ischemic infarcts  2/16 S/P tracheostomy and Cortrak placement (tip in the stomach) 2/24 S/P PEG 2/25 TF being resumed 3/24 DYS3/Nectar thick ordered  Calorie count was ordered by MD.  RD discussed pt with RN. Per RN, calorie count was initiated yesterday and pt consumed ~75% of dinner on 3/24. RD will follow-up tomorrow with results of calorie count.   PO Intake: 75-80% x2 recorded meals since dysphagia 3 diet was ordered  Admit weight: 69.8 kg Current weight: 69 kg  Medications reviewed and include: Vitamin C, Pepcid, Osmolite 1.5 cal daily, free water QID, SSI, Levemir, MVI, Juven TID, Senokot-S, Zinc Sulfate  Labs reviewed. CBGs 96-244  UOP: x24 hours I/O: -13,072ml since admit  Diet Order:   Diet Order            DIET DYS 3 Room service appropriate? Yes; Fluid consistency: Nectar Thick  Diet effective now        Diet - low sodium heart healthy              EDUCATION NEEDS:   No education needs have been identified at this time  Skin:  Skin  Assessment: Skin Integrity Issues: Skin Integrity Issues:: Other (Comment), Unstageable, Incisions Unstageable: coccyx Incisions: lip Other: necrotic toes  Last BM:  3/20  Height:   Ht Readings from Last 1 Encounters:  03/24/19 5\' 6"  (1.676 m)    Weight:   Wt Readings from Last 1 Encounters:  04/08/19 69 kg    BMI:  Body mass index is 24.55 kg/m.  Estimated Nutritional Needs:   Kcal:  2300-2600  Protein:  120-145 grams  Fluid:  >/=2L/d   04/10/19, MS, RD, LDN RD pager number and weekend/on-call pager number located in Amion.

## 2019-04-09 ENCOUNTER — Inpatient Hospital Stay (HOSPITAL_COMMUNITY): Payer: Self-pay | Admitting: Occupational Therapy

## 2019-04-09 ENCOUNTER — Inpatient Hospital Stay (HOSPITAL_COMMUNITY): Payer: Self-pay | Admitting: Physical Therapy

## 2019-04-09 ENCOUNTER — Inpatient Hospital Stay (HOSPITAL_COMMUNITY): Payer: Self-pay | Admitting: Speech Pathology

## 2019-04-09 LAB — GLUCOSE, CAPILLARY
Glucose-Capillary: 77 mg/dL (ref 70–99)
Glucose-Capillary: 166 mg/dL — ABNORMAL HIGH (ref 70–99)
Glucose-Capillary: 146 mg/dL — ABNORMAL HIGH (ref 70–99)

## 2019-04-09 MED ORDER — INSULIN ASPART 100 UNIT/ML ~~LOC~~ SOLN
0.0000 [IU] | SUBCUTANEOUS | Status: DC
  Administered 2019-04-09: 1 [IU] via SUBCUTANEOUS
  Administered 2019-04-10 (×2): 3 [IU] via SUBCUTANEOUS
  Administered 2019-04-10: 2 [IU] via SUBCUTANEOUS
  Administered 2019-04-10: 1 [IU] via SUBCUTANEOUS

## 2019-04-09 MED ORDER — INSULIN DETEMIR 100 UNIT/ML ~~LOC~~ SOLN
20.0000 [IU] | Freq: Every day | SUBCUTANEOUS | Status: DC
  Administered 2019-04-09 – 2019-04-11 (×3): 20 [IU] via SUBCUTANEOUS
  Filled 2019-04-09 (×4): qty 0.2

## 2019-04-09 MED ORDER — GLUCERNA SHAKE PO LIQD
237.0000 mL | Freq: Three times a day (TID) | ORAL | Status: DC
  Administered 2019-04-10 – 2019-04-14 (×7): 237 mL via ORAL

## 2019-04-09 MED ORDER — ENSURE ENLIVE PO LIQD: 237.0000 mL | Freq: Three times a day (TID) | ORAL | Status: DC

## 2019-04-09 NOTE — Progress Notes (Signed)
[facility] PHYSICAL MEDICINE & REHABILITATION PROGRESS NOTE   Subjective/Complaints:  Pt excited- is able to progress to thins! Pt reports something feels like stuck in throat- I explained this is cork/plug and will get better-   Pharm recs decreasing Levemir for DM to 20 units- done.    ROS:   Pt denies SOB, abd pain, CP, N/V/C/D, and vision changes   Objective:   No results found. No results for input(s): WBC, HGB, HCT, PLT in the last 72 hours. No results for input(s): NA, K, CL, CO2, GLUCOSE, BUN, CREATININE, CALCIUM in the last 72 hours.  Intake/Output Summary (Last 24 hours) at 04/09/2019 1637 Last data filed at 04/09/2019 1330 Gross per 24 hour  Intake 420 ml  Output 850 ml  Net -430 ml     Physical Exam: Vital Signs Blood pressure (!) 130/91, pulse (!) 101, temperature 98.3 F (36.8 C), temperature source Oral, resp. rate 20, height 5\' 6"  (1.676 m), weight 69 kg, SpO2 98 %.  Nursing note and vitals reviewed. General: sitting up in bed; interpretor at bedside, NAD HEENT: trach in place; corked; sats 96% and pulse 88 at rest,  Smiling big Heart: RRR; no JVD Lungs:  CTA B/L- corked-  Abdomen: (+) PEG- soft, NT; ND, (+)BS Extremities: Gangrenous 1st and 2nd toes bilaterally more extensive on the left - 1st toe looks like has pus draining around it; otherwise other toes look better  Motor 4- BUE and 3- B HF, 4- Knee ext , 3- ADF , PF Skin: sacral wound a little more shallow appearing  Assessment/Plan: 1. 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  Physiatrist is providing close team supervision and 24 hour management of active medical problems listed below.  Physiatrist and rehab team continue to assess barriers to discharge/monitor patient progress toward functional and medical goals  Care Tool:  Bathing     Body parts bathed by patient: Right arm, Left arm, Chest, Abdomen, Face, Front perineal area, Buttocks, Right upper leg, Left upper leg, Right lower leg, Left lower leg   Body parts bathed by helper: Front perineal area, Buttocks, Right upper leg, Left upper leg, Right lower leg, Left lower leg     Bathing assist Assist Level: Minimal Assistance - Patient > 75%     Upper Body Dressing/Undressing Upper body dressing   What is the patient wearing?: Pull over shirt    Upper body assist Assist Level: Supervision/Verbal cueing    Lower Body Dressing/Undressing Lower body dressing      What is the patient wearing?: Pants     Lower body assist Assist for lower body dressing: Minimal Assistance - Patient > 75%     Toileting Toileting Toileting Activity did not occur (Clothing management and hygiene only): N/A (no void or bm)(foley)  Toileting assist Assist for toileting: Supervision/Verbal cueing     Transfers Chair/bed transfer  Transfers assist  Chair/bed transfer activity did not occur: Safety/medical concerns(limited by dizziness, chest pain)  Chair/bed transfer assist level: Minimal Assistance - Patient > 75% Chair/bed transfer assistive device:   Ambulation assist   Ambulation activity did not occur: Safety/medical concerns(limited by dizziness, chest pain)  Assist level: Minimal Assistance - Patient > 75% Assistive device: No Device Max distance: 120'   Walk 10 feet activity   Assist  Walk 10 feet activity did not occur: Safety/medical concerns(limited  by dizziness, chest pain)  Assist level: Minimal Assistance - Patient > 75% Assistive device: No Device   Walk 50 feet activity   Assist Walk 50 feet with 2 turns activity did not occur: Safety/medical concerns(limited by dizziness, chest pain)  Assist level: Minimal Assistance - Patient > 75% Assistive device: No Device    Walk 150 feet activity   Assist Walk 150  feet activity did not occur: Safety/medical concerns         Walk 10 feet on uneven surface  activity   Assist Walk 10 feet on uneven surfaces activity did not occur: Safety/medical concerns(limited by dizziness, chest pain)         Wheelchair     Assist     Wheelchair activity did not occur: Safety/medical concerns(limited by dizziness, chest pain)         Wheelchair 50 feet with 2 turns activity    Assist    Wheelchair 50 feet with 2 turns activity did not occur: Safety/medical concerns(limited by dizziness, chest pain)       Wheelchair 150 feet activity     Assist  Wheelchair 150 feet activity did not occur: Safety/medical concerns(limited by dizziness, chest pain)       Blood pressure (!) 130/91, pulse (!) 101, temperature 98.3 F (36.8 C), temperature source Oral, resp. rate 20, height 5\' 6"  (1.676 m), weight 69 kg, SpO2 98 %.  1.  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              -patient may shower             -ELOS/Goals: 2-3 weeks minA , Cont CIR  PT, OT, SLP  3/26- tolerating capping- will remove next week- plan 2.  Antithrombotics: -DVT/anticoagulation:  Pharmaceutical: Other (comment)--Eliquis             -antiplatelet therapy: N/A 3. Pain Management: Fentanyl IV prior to hydrotherapy. Will add oxycodone prn for sacral pain.   3/23- having toe pain- explained due to getting increased blood flow- will get better eventually, but likely worse first- prn pain meds  3/26- off fentanyl- taking oxy prn for toes only 4. Mood: LCSW to follow for evaluation and support.              -antipsychotic agents: N/A 5. Neuropsych: This patient appears to be capable of making decisions on his own behalf. 6. Sacral decubitus/Skin/Wound Care- Was unstageable- initially, covered with eshar initially- looks deep Stage III currently. : continue air mattress overlay.  Hydrotherapy 6 days a week. Tube feeds with  additional protein supplement and vitamins to help promote wound healing.   3/22- stopped hydrotherapy Friday- d/c'd Fentanyl  3/24- healing still- a little more shallow 7. Fluids/Electrolytes/Nutrition: Monitor I/O. Check lytes in am and weekly.  8. T2DM: Hgb A1c- 11.7--continue Levemir bid with SSI every 4 hours for elevated BS. Will consult dietician to start bolus tube feeds.    CBG (last 3)  Recent Labs    04/08/19 2103 04/09/19 0606 04/09/19 1454  GLUCAP 106* 77 166*   Good am control will cont to  monitor prior to additional changes     3/18- DM team added Novolog 3 units- will monitor  3/26- decrease Levemir to 20 units BID since eating- is low 9. Dizziness: Will change Cardura to bedtime to avoid SE. Will monitor orthostatic Bps  3/14- BP on soft side and slightly tachycardic, likely due to breahting meds and overall deconditioning-  con't to monitor  3/24- denies dizziness now 10. 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   3/23- improving R sided strength 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   3/11- will add Duonebs QID- not prn-   3/12- improved SOB- con't regimen- discussed with resp therapy.  3/14- con't Duonebs-TID   3/26- plugged- tolerating well- decannulate next week 12. Resting tachycardia: Due to deconditioning. No BB due to soft BP/orthostatic symptoms.  Monitor for now.    Vitals:   04/09/19 1105 04/09/19 1457  BP:  (!) 130/91  Pulse: 97 (!) 101  Resp: 16 20  Temp:  98.3 F (36.8 C)  SpO2: 96% 98%  mild  Diastolic elevation   3/26- pulse 88 - low for first time in awhile- looks great 13. Gangrenous changes bilateral feet: Will order off loading shoes. Continue to monitor.  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   3/25- except L 1st toe, all healing more daily.  14. Abnormal LFTs: Recheck labs in am.   3/10- mild elevation of ALT only-  will con't to monitor 15. ABLA: Due to critical illness. Will check anemia panel.   3/10- looking better- con't treatment 16. Right Vocal fold immobility, dysphonia:s/p transnasal fiberoptic laryngoscopy by ENT on 3/9 but was aborted due to gagging and vomiting. ENT will re-attempt in a couple of days.   3/11- could hear a few words using PMV this AM- will con't to monitor  Pt ok with ENT re attempting laryngoscopy   3/16- will call ENT to get scope done for pt.  3/17- waiting for ENT   3/18- ENT scoped pt- going to [place] next week- will arrange per ENT  3/22- don't know day- will find out from ENT.  3/23- injected vocal folds with Prolaryn R>L and kenalog on R due to being tethetered  3/26- on thins NOW (was on nectar thick- doing great) 18. Disposition: Patient lives with wife and 5 children aged 4-23 who will provide support upon discharge.    LOS: 17 days A FACE TO FACE EVALUATION WAS PERFORMED  Jacoya Bauman 04/09/2019, 4:37 PM

## 2019-04-09 NOTE — Progress Notes (Signed)
Calorie Count Note  48 hour calorie count ordered.  Diet: Dysphagia 3 Low Sodium, Heart Healthy Supplements: Vital Cuisine Shakes TID, Juven   Per RN, pt is not drinking the Vital Cuisine shakes.   3/24 Dinner: 500 kcal, 17 grams protein  3/25 Breakfast: 502 kcal, 13 grams protein Lunch: 476 kcal, 19 grams protein Dinner: 149 kcal, 6 grams protein Supplements: 190 kcal, 5 grams protein  Total intake: 1317 kcal (57% of minimum estimated needs)  43 protein (35% of minimum estimated needs)  3/26 Breakfast: 471 kcal, 12 grams protein  Lunch: 1138 kcal, 55 grams protein Dinner: 721 kcal, 27 grams protein Supplements: 190 kcal, 5 grams protein  RN states pt ate 100% of lunch (not documented) and anticipates pt will eat 100% of dinner.   Total intake: 2520 kcal (100% of minimum estimated needs)  99 protein (82% of minimum estimated needs)  Nutrition Dx: Increased nutrient needs related to wound healing as evidenced by estimated needs.  Goal: Patient will meet greater than or equal to 90% of their needs  Intervention:  -Juven BID -d/c Vital Cuisine shake TID, pt now on thin liquids -Ensure Enlive TID, each supplement provide 350 kcal and 20 grams protein -Magic cup TID with meals, each supplement provides 290 kcal and 9 grams of protein   Eugene Gavia, MS, RD, LDN RD pager number and weekend/on-call pager number located in [place].

## 2019-04-09 NOTE — Progress Notes (Signed)
Occupational Therapy Session Note  Patient Details  Name: Andrew Bruce MRN: [id number] Date of Birth: [birth year]  Today's Date: 04/09/2019 OT Individual Time: 0900-1000 OT Individual Time Calculation (min): 60 min    Short Term Goals: Week 1:  OT Short Term Goal 1 (Week 1): Pt will tolerate out of bed for 2-3 hrs a day OT Short Term Goal 1 - Progress (Week 1): Met OT Short Term Goal 2 (Week 1): Pt will perform UB dressing wth mod A OT Short Term Goal 2 - Progress (Week 1): Met OT Short Term Goal 3 (Week 1): Pt will dress LB clothing (brief/ pants) with max A OT Short Term Goal 3 - Progress (Week 1): Met OT Short Term Goal 4 (Week 1): Pt will transfer to toilet with max A +1 OT Short Term Goal 4 - Progress (Week 1): Met Week 2:  OT Short Term Goal 1 (Week 2): Pt will perform 3/3 toileting tasks wtih min  A OT Short Term Goal 1 - Progress (Week 2): Met OT Short Term Goal 2 (Week 2): Pt will perform bed mobility in prep for ADL with supervision OT Short Term Goal 2 - Progress (Week 2): Met OT Short Term Goal 3 (Week 2): Pt will stand to perform one grooming task at the sink with min A OT Short Term Goal 3 - Progress (Week 2): Met OT Short Term Goal 4 (Week 2): Pt will don LB clothing with min A OT Short Term Goal 4 - Progress (Week 2): Met Week 3:  OT Short Term Goal 1 (Week 3): patient will complete bathing and dressing CS/set up seated OT Short Term Goal 2 (Week 3): patient will complete functional transfers and ambulation with LRAD CS OT Short Term Goal 3 (Week 3): patient will improve standing tolerance to 10 minutes with light activity  Skilled Therapeutic Interventions/Progress Updates:    1:1 Self care retraining at shower level. Pt already sitting EOB. Pt ambulated to bathroom with min A with RW to the bathroom. Doffed clothing with supervision with only A for doffing post op shoes. Pt able to shower all parts sit to stands with contact guard. PT continues to wear trach collar  protector. Pt's trach is capped and maintaining sats WFL. Pt dressed sit to stand with min guard. Pt able to don socks but not post op shoes. Pt's room rearranged to encourage sitting up in the recliner with sitting on pressure relieving cushion. Pt participated in helping to shave with his left hand. Discussed with him about having his wife coming in next week to see how he is doing.  Therapy Documentation Precautions:  Precautions Precautions: Fall, Other (comment) Precaution Comments: PMSV at all times, PEG;  NPO Restrictions Weight Bearing Restrictions: No Other Position/Activity Restrictions: HOB elevated 30 degrees at all times Pain: Pain Assessment Pain Scale: 0-10 Pain Score: 6  Pain Type: Acute pain Pain Location: Toe (Comment which one) Pain Orientation: Right;Left Pain Descriptors / Indicators: Throbbing Pain Frequency: Intermittent Pain Onset: On-going Patients Stated Pain Goal: 2 Pain Intervention(s): Medication (See eMAR);Repositioned   Therapy/Group: Individual Therapy  [name] [facility] 04/09/2019, 10:07 AM

## 2019-04-09 NOTE — Progress Notes (Signed)
Physical Therapy Session Note  Patient Details  Name: Andrew Bruce MRN: [id number] Date of Birth: 1965/02/05  Today's Date: 04/09/2019 PT Individual Time: 3825-0539 PT Individual Time Calculation (min): 69 min   Short Term Goals: Week 3:  PT Short Term Goal 1 (Week 3): Patient will perform bed mobility with supervision without use of bed rails. PT Short Term Goal 2 (Week 3): Patient will perform basic transfers with supervision using LRAD. PT Short Term Goal 3 (Week 3): Patient will sit OOB at least 30-60 min 2x per day for improved sitting tolerance. PT Short Term Goal 4 (Week 3): Patient will perform 2 steps with 1 rail with min A.  Skilled Therapeutic Interventions:   Pt presents reclined in recliner, ready for therapy.  Pt required assist to don bilateral Darco shoes over slipper socks.  Pt speaking more and on RA.  O2 sats remained > 90% throughout rx, although HR increased to 120's occasionally w/ activity, but returned to mid 100's.  Pt performed multiple sit to stand transfers w/ CGA.  Pt performed LE ther ex 3 x 10 for LAQ, hip flexion and abd/add w/ rest breaks between d/t c/o fatigue.  Pt amb multiple trials w/o AD and min A up to 120' w/ seated rest break between trials.  Pt amb into BR and sat on commode for urination.  Pt required CGA to SBA for sit to stand.  Pt amb multiple trials w/ RW up to 100' including turns to return to seat.  Pt performed sit to stand activities w/ hands on thighs for increased LE strength and coordination.  Pt returned to recliner w/ all needs in reach and reclined fully w/ LEs elevated.  Nursing to replace pulse ox to finger.  Spoke w/ nursing re: amb to BR w/ nursing and states pt is walking to BR w/ nursing assist of 1 and RW.  Therapy Documentation Precautions:  Precautions Precautions: Fall, Other (comment) Precaution Comments: PMSV at all times, PEG;  NPO Restrictions Weight Bearing Restrictions: No Other Position/Activity Restrictions: HOB  elevated 30 degrees at all times General:   Vital Signs: Therapy Vitals Temp: 98.3 F (36.8 C) Temp Source: Oral Pulse Rate: (!) 101 Resp: 20 BP: (!) 130/91 Patient Position (if appropriate): Sitting Oxygen Therapy SpO2: 98 % O2 Device: Room Air Pain: 5/10 toes.   Mobility:   Locomotion :    Trunk/Postural Assessment :    Balance:   Exercises:   Other Treatments:      Therapy/Group: Individual Therapy  [name] 04/09/2019, 3:38 PM

## 2019-04-09 NOTE — Progress Notes (Signed)
Speech Language Pathology Daily Session Note  Patient Details  Name: Rowdy Guerrini MRN: [id number] Date of Birth: 05/27/1965  Today's Date: 04/09/2019 SLP Individual Time: 1115-1203 SLP Individual Time Calculation (min): 48 min  Short Term Goals: Week 3: SLP Short Term Goal 1 (Week 3): Pt will consume current diet with minimal overt s/s of dysphagia or aspiration and supervision cues for use of compensatory strategies. SLP Short Term Goal 2 (Week 3): Pt will consume trials of thin liquids with minimal overt s/s of aspiration and no decline in respiratory status to demonstrate readiness for repeat instrumental study. SLP Short Term Goal 3 (Week 3): Pt will phonate to achieve ~ 75% intelligibility at the phrase level with Min A cues. SLP Short Term Goal 4 (Week 3): Pt will complete 3 sets of 10 pharyngeal strengthening exercises (effortful swallow) with supervision cues. SLP Short Term Goal 5 (Week 3): With Min A multimodal cues, pt will donn and doff PMSV correctly in 5 out of 10 opportunities. SLP Short Term Goal 6 (Week 3): Given Min A cues, pt will complete 5 repetitions with EMST 150 device set at 30 cmH20 and  5 repeitions with IMST device set at 10 cmH20.  Skilled Therapeutic Interventions: Skilled treatment session targeted dysphagia, speech communication and education on trach/capping trials. SLP facilitiated session by providing skilled observation of pt consuming water via cup. Pt was very cautious (almost afraid) so he is good at self-monitoring bolus size and rate. Pt consumed without any changes to voice, vitals, WOB and no overt s/s of aspiration. As such pt is appropriate for upgrade to thin liquids via cup. Clarification was also provided to pt's nurse that pt was to consume medicine orally rather than PEG. SLP also facilitated session by providing supervision cues for repetition with increased voicing to achieve ~ 90% speech intelligibility at the sentence level. SLP also provided  detailed education on trach and capping. Pocket T.O.M. was utilized for visual demonstration and comprehension of concepts. All questions were answered to pt's satisfaction. Pt left upright in recliner with all needs within reach. Continue per current plan of care.      Pain    Therapy/Group: Individual Therapy  Joeli Fenner 04/09/2019, 3:17 PM

## 2019-04-10 DIAGNOSIS — E1165 Type 2 diabetes mellitus with hyperglycemia: Secondary | ICD-10-CM

## 2019-04-10 DIAGNOSIS — D62 Acute posthemorrhagic anemia: Secondary | ICD-10-CM

## 2019-04-10 DIAGNOSIS — I96 Gangrene, not elsewhere classified: Secondary | ICD-10-CM

## 2019-04-10 DIAGNOSIS — E162 Hypoglycemia, unspecified: Secondary | ICD-10-CM

## 2019-04-10 DIAGNOSIS — R7309 Other abnormal glucose: Secondary | ICD-10-CM

## 2019-04-10 LAB — GLUCOSE, CAPILLARY
Glucose-Capillary: 127 mg/dL — ABNORMAL HIGH (ref 70–99)
Glucose-Capillary: 116 mg/dL — ABNORMAL HIGH (ref 70–99)
Glucose-Capillary: 245 mg/dL — ABNORMAL HIGH (ref 70–99)
Glucose-Capillary: 60 mg/dL — ABNORMAL LOW (ref 70–99)
Glucose-Capillary: 94 mg/dL (ref 70–99)
Glucose-Capillary: 158 mg/dL — ABNORMAL HIGH (ref 70–99)
Glucose-Capillary: 216 mg/dL — ABNORMAL HIGH (ref 70–99)
Glucose-Capillary: 132 mg/dL — ABNORMAL HIGH (ref 70–99)

## 2019-04-10 NOTE — Progress Notes (Signed)
Andrew Bruce PHYSICAL MEDICINE & REHABILITATION PROGRESS NOTE   Subjective/Complaints: Patient seen laying in bed this morning.  He indicates he slept well overnight.  ROS: Denies SOB, abd pain, CP, N/V/C/D, and vision changes  Objective:   No results found. No results for input(s): WBC, HGB, HCT, PLT in the last 72 hours. No results for input(s): NA, K, CL, CO2, GLUCOSE, BUN, CREATININE, CALCIUM in the last 72 hours.  Intake/Output Summary (Last 24 hours) at 04/10/2019 1152 Last data filed at 04/10/2019 0610 Gross per 24 hour  Intake 600 ml  Output 900 ml  Net -300 ml     Physical Exam: Vital Signs Blood pressure (!) 133/92, pulse 97, temperature 98.7 F (37.1 C), temperature source Oral, resp. rate 17, height 5\' 6"  (1.676 m), weight 69 kg, SpO2 97 %. Constitutional: No distress . Vital signs reviewed. HENT: Normocephalic.  Atraumatic.  Trach plugged. Eyes: EOMI. No discharge. Cardiovascular: No JVD. Respiratory: Normal effort.  No stridor. GI: Non-distended.  + PEG. Skin: Dry gangrene bilateral lower extremity digits 1 and 2 Psych: Normal mood.  Normal behavior. Musc: No edema in extremities.  No tenderness in extremities. Neuro: Alert Motor: Bilateral upper extremities: 4/5 proximal distal Bilateral lower extremities: 4 -/5 proximal to distal  Assessment/Plan: 1. 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  Physiatrist is providing close team supervision and 24 hour management of active medical problems listed below.  Physiatrist and rehab team continue to assess barriers to discharge/monitor patient progress toward functional and medical goals  Care Tool:  Bathing    Body parts bathed by patient: Right arm, Left arm, Chest, Abdomen, Face, Front perineal area, Buttocks, Right upper leg, Left upper leg, Right lower leg,  Left lower leg   Body parts bathed by helper: Front perineal area, Buttocks, Right upper leg, Left upper leg, Right lower leg, Left lower leg     Bathing assist Assist Level: Minimal Assistance - Patient > 75%     Upper Body Dressing/Undressing Upper body dressing   What is the patient wearing?: Pull over shirt    Upper body assist Assist Level: Supervision/Verbal cueing    Lower Body Dressing/Undressing Lower body dressing      What is the patient wearing?: Pants     Lower body assist Assist for lower body dressing: Minimal Assistance - Patient > 75%     Toileting Toileting Toileting Activity did not occur (Clothing management and hygiene only): N/A (no void or bm)(foley)  Toileting assist Assist for toileting: Supervision/Verbal cueing     Transfers Chair/bed transfer  Transfers assist  Chair/bed transfer activity did not occur: Safety/medical concerns(limited by dizziness, chest pain)  Chair/bed transfer assist level: Minimal Assistance - Patient > 75% Chair/bed transfer assistive device:   Ambulation assist   Ambulation activity did not occur: Safety/medical concerns(limited by dizziness, chest pain)  Assist level: Minimal Assistance - Patient > 75% Assistive device: No Device Max distance: 120'   Walk 10 feet activity   Assist  Walk 10 feet activity did not occur: Safety/medical concerns(limited by dizziness, chest pain)  Assist level: Minimal Assistance - Patient > 75% Assistive device: No Device   Walk 50 feet activity   Assist Walk 50 feet with 2 turns activity did not occur: Safety/medical concerns(limited by dizziness, chest pain)  Assist level: Minimal Assistance - Patient > 75% Assistive device: No  Device    Walk 150 feet activity   Assist Walk 150 feet activity did not occur: Safety/medical concerns         Walk 10 feet on uneven surface  activity   Assist Walk 10 feet on uneven surfaces  activity did not occur: Safety/medical concerns(limited by dizziness, chest pain)         Wheelchair     Assist     Wheelchair activity did not occur: Safety/medical concerns(limited by dizziness, chest pain)         Wheelchair 50 feet with 2 turns activity    Assist    Wheelchair 50 feet with 2 turns activity did not occur: Safety/medical concerns(limited by dizziness, chest pain)       Wheelchair 150 feet activity     Assist  Wheelchair 150 feet activity did not occur: Safety/medical concerns(limited by dizziness, chest pain)       Blood pressure (!) 133/92, pulse 97, temperature 98.7 F (37.1 C), temperature source Oral, resp. rate 17, height 5\' 6"  (1.676 m), weight 69 kg, SpO2 97 %.  1.  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   Continue CIR  2.  Antithrombotics: -DVT/anticoagulation:  Pharmaceutical: Other (comment)--Eliquis             -antiplatelet therapy: N/A 3. Pain Management: Added oxycodone prn for sacral pain.   Relatively controlled on 3/27  4. Mood: LCSW to follow for evaluation and support.              -antipsychotic agents: N/A 5. Neuropsych: This patient appears to be capable of making decisions on his own behalf. 6. Sacral decubitus/Skin/Wound Care- Was unstageable- initially, covered with eshar initially- deep Stage III currently. Continue air mattress overlay. 7. Fluids/Electrolytes/Nutrition: Monitor I/Os 8. T2DM with hyperglycemia: Hgb A1c- 11.7  Levemir bid with SSI   Consulted dietician to start bolus tube feeds.    CBG (last 3)  Recent Labs    04/10/19 0601 04/10/19 0640 04/10/19 1128  GLUCAP 60* 94 158*   Novolog 3 units- will monitor  Decrease Levemir to 20 units BID   P.o. intake improving, CBGs labile with hypoglycemia on 3/27  Continue to monitor 9. Dizziness: Changed Cardura to bedtime to avoid SE. Will monitor orthostatic Bps  Improving  10. Embolic bilateral  CVA: On on Eliquis bid.  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   Added Duonebs QID  Plan for decannulation next week 12. Resting tachycardia: Due to deconditioning. No BB due to soft BP/orthostatic symptoms.  Monitor for now.    Vitals:   04/10/19 0825 04/10/19 1140  BP:    Pulse: (!) 114 97  Resp: 18 17  Temp:    SpO2: 94% 97%   Slightly labile on 3/27 13. Gangrenous changes bilateral feet: off loading shoes. Continue to monitor.  14. Abnormal LFTs:   3/10- mild elevation of ALT only- will con't to monitor 15. ABLA: Due to critical illness.   Hemoglobin 11.4 on 3/22, labs ordered for Monday 16. Right Vocal fold immobility, dysphonia:s/p transnasal fiberoptic laryngoscopy by ENT on 3/9 but was aborted due to gagging and vomiting.   3/23- injected vocal folds with Prolaryn R>L and kenalog on R due to being tethetered  LOS: 18 days A FACE TO FACE EVALUATION WAS PERFORMED  Latrica Clowers Lorie Phenix 04/10/2019, 11:52 AM

## 2019-04-10 NOTE — Progress Notes (Signed)
Hypoglycemic Event  CBG:  60  Treatment: 4 oz of soda given to pt  Symptoms: None   Follow-up CBG: Time: 0640 CBG Result: 94  Possible Reasons for Event: 3am insulin coverage   Comments/MD notified:   Durene Romans

## 2019-04-10 NOTE — Plan of Care (Signed)
  Problem: RH SKIN INTEGRITY Goal: RH STG SKIN FREE OF INFECTION/BREAKDOWN Description: No new areas of break down while on CIR Outcome: Not Progressing; necrotic toes; bottom unstageable

## 2019-04-11 ENCOUNTER — Inpatient Hospital Stay (HOSPITAL_COMMUNITY): Payer: Self-pay

## 2019-04-11 DIAGNOSIS — R0989 Other specified symptoms and signs involving the circulatory and respiratory systems: Secondary | ICD-10-CM

## 2019-04-11 LAB — GLUCOSE, CAPILLARY
Glucose-Capillary: 105 mg/dL — ABNORMAL HIGH (ref 70–99)
Glucose-Capillary: 98 mg/dL (ref 70–99)
Glucose-Capillary: 128 mg/dL — ABNORMAL HIGH (ref 70–99)
Glucose-Capillary: 117 mg/dL — ABNORMAL HIGH (ref 70–99)
Glucose-Capillary: 190 mg/dL — ABNORMAL HIGH (ref 70–99)

## 2019-04-11 MED ORDER — INSULIN ASPART 100 UNIT/ML ~~LOC~~ SOLN
0.0000 [IU] | Freq: Three times a day (TID) | SUBCUTANEOUS | Status: DC
  Administered 2019-04-11 – 2019-04-12 (×2): 1 [IU] via SUBCUTANEOUS
  Administered 2019-04-14: 2 [IU] via SUBCUTANEOUS
  Administered 2019-04-15 – 2019-04-19 (×4): 1 [IU] via SUBCUTANEOUS

## 2019-04-11 NOTE — Progress Notes (Signed)
Andrew Bruce PHYSICAL MEDICINE & REHABILITATION PROGRESS NOTE   Subjective/Complaints: Patient seen laying in bed this morning.  He states he slept well overnight.  He denies complaints.  Per nursing notes, patient noted to have pain in his feet and was given medications with benefit.  ROS: Denies SOB, CP, N/V/D  Objective:   No results found. No results for input(s): WBC, HGB, HCT, PLT in the last 72 hours. No results for input(s): NA, K, CL, CO2, GLUCOSE, BUN, CREATININE, CALCIUM in the last 72 hours.  Intake/Output Summary (Last 24 hours) at 04/11/2019 1151 Last data filed at 04/11/2019 0700 Gross per 24 hour  Intake 120 ml  Output 900 ml  Net -780 ml     Physical Exam: Vital Signs Blood pressure (!) 145/81, pulse 95, temperature 98.6 F (37 C), temperature source Oral, resp. rate 17, height 5\' 6"  (1.676 m), weight 69 kg, SpO2 95 %.  Constitutional: No distress . Vital signs reviewed. HENT: Normocephalic.  Atraumatic.  Trach plugged. Eyes: EOMI. No discharge. Cardiovascular: No JVD.  Respiratory: Normal effort.  No stridor. GI: Non-distended.  + PEG. Skin: Dry gangrene to bilateral toes, stable Psych: Normal mood.  Normal behavior. Musc: No edema in extremities.  No tenderness in extremities. Neuro: Alert Motor: Bilateral upper extremities: 4/5 proximal distal Bilateral lower extremities: 4 --4/5 proximal to distal  Assessment/Plan: 1. 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  Physiatrist is providing close team supervision and 24 hour management of active medical problems listed below.  Physiatrist and rehab team continue to assess barriers to discharge/monitor patient progress toward functional and medical goals  Care Tool:  Bathing    Body parts bathed by patient: Right arm, Left arm, Chest, Abdomen, Face, Front  perineal area, Buttocks, Right upper leg, Left upper leg, Right lower leg, Left lower leg   Body parts bathed by helper: Front perineal area, Buttocks, Right upper leg, Left upper leg, Right lower leg, Left lower leg     Bathing assist Assist Level: Minimal Assistance - Patient > 75%     Upper Body Dressing/Undressing Upper body dressing   What is the patient wearing?: Pull over shirt    Upper body assist Assist Level: Supervision/Verbal cueing    Lower Body Dressing/Undressing Lower body dressing      What is the patient wearing?: Pants     Lower body assist Assist for lower body dressing: Minimal Assistance - Patient > 75%     Toileting Toileting Toileting Activity did not occur (Clothing management and hygiene only): N/A (no void or bm)(foley)  Toileting assist Assist for toileting: Supervision/Verbal cueing     Transfers Chair/bed transfer  Transfers assist  Chair/bed transfer activity did not occur: Safety/medical concerns(limited by dizziness, chest pain)  Chair/bed transfer assist level: Minimal Assistance - Patient > 75% Chair/bed transfer assistive device:   Ambulation assist   Ambulation activity did not occur: Safety/medical concerns(limited by dizziness, chest pain)  Assist level: Minimal Assistance - Patient > 75% Assistive device: No Device Max distance: 120'   Walk 10 feet activity   Assist  Walk 10 feet activity did not occur: Safety/medical concerns(limited by dizziness, chest pain)  Assist level: Minimal Assistance - Patient > 75% Assistive device: No Device   Walk 50 feet activity   Assist Walk 50 feet with 2 turns activity did not occur: Safety/medical concerns(limited  by dizziness, chest pain)  Assist level: Minimal Assistance - Patient > 75% Assistive device: No Device    Walk 150 feet activity   Assist Walk 150 feet activity did not occur: Safety/medical concerns         Walk 10 feet on  uneven surface  activity   Assist Walk 10 feet on uneven surfaces activity did not occur: Safety/medical concerns(limited by dizziness, chest pain)         Wheelchair     Assist     Wheelchair activity did not occur: Safety/medical concerns(limited by dizziness, chest pain)         Wheelchair 50 feet with 2 turns activity    Assist    Wheelchair 50 feet with 2 turns activity did not occur: Safety/medical concerns(limited by dizziness, chest pain)       Wheelchair 150 feet activity     Assist  Wheelchair 150 feet activity did not occur: Safety/medical concerns(limited by dizziness, chest pain)       Assessment/plan: 1.  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   Continue CIR  2.  Antithrombotics: -DVT/anticoagulation:  Pharmaceutical: Other (comment)--Eliquis             -antiplatelet therapy: N/A 3. Pain Management: Added oxycodone prn for sacral pain.   Relatively controlled with meds on 3/28 4. Mood: LCSW to follow for evaluation and support.              -antipsychotic agents: N/A 5. Neuropsych: This patient appears to be capable of making decisions on his own behalf. 6. Sacral decubitus/Skin/Wound Care  Sacral ulcer  Continue air mattress overlay. 7. Fluids/Electrolytes/Nutrition: Monitor I/Os 8. T2DM with hyperglycemia: Hgb A1c- 11.7  Levemir bid with SSI   Consulted dietician to start bolus tube feeds.    CBG (last 3)  Recent Labs    04/11/19 0405 04/11/19 0617 04/11/19 1148  GLUCAP 105* 98 128*   Novolog 3 units- will monitor  Decrease Levemir to 20 units BID   Labile, but?  Stabilizing.  Patient also appears to be eating food from home  Continue to monitor 9. Dizziness: Changed Cardura to bedtime to avoid SE. Will monitor orthostatic Bps  Improving  10. 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   Added Duonebs QID  Plan for decannulation this  week 12. Resting tachycardia: Due to deconditioning. No BB due to soft BP/orthostatic symptoms.  Monitor for now.    Vitals:   04/11/19 0824 04/11/19 1118  BP:    Pulse: (!) 103 95  Resp: 18 17  Temp:    SpO2: 94% 95%   Labile, but?  Improving on 3/28 13. Gangrenous changes bilateral feet: off loading shoes. Continue to monitor.  14. Abnormal LFTs:   3/10- mild elevation of ALT only- will con't to monitor 15. ABLA: Due to critical illness.   Hemoglobin 11.4 on 3/22, labs ordered for tomorrow 16. Right Vocal fold immobility, dysphonia:s/p transnasal fiberoptic laryngoscopy by ENT on 3/9 but was aborted due to gagging and vomiting.   3/23- injected vocal folds with Prolaryn R>L and kenalog on R due to being tethetered 17.  Labile blood pressure  Monitor for trend  LOS: 19 days A FACE TO FACE EVALUATION WAS PERFORMED  Andrew Bruce Lorie Phenix 04/11/2019, 11:51 AM

## 2019-04-11 NOTE — Progress Notes (Signed)
Patient was alert & oriented at the beginning of the shift, noted sitting in a chair in his room. His trach is capped, peg tube is patent. Refused his bolus feeding last night, stated he felt full. When I mentioned his food tray, which was untouched, he stated that he did eat what his wife brought to him. He also wanted his meds by peg. He did not c/o pain until this morning. The pain he expressed was from his feet. Both feet have necrotic looking toes that are dry but he can move them.It looks like there is some healing at the borders. Pedal pulses are present. Prn pain medication was given as per order. He turns well & follows commands. His sacral wound has some maceration around it. A smaller foam dressing was applied after very little moisture added to santyl & dry dressing to keep the gauze in the wound bed. No acute distress noted.

## 2019-04-11 NOTE — Plan of Care (Signed)
  Problem: RH SKIN INTEGRITY Goal: RH STG SKIN FREE OF INFECTION/BREAKDOWN Description: No new areas of break down while on CIR Outcome: Not Progressing; necrotic toes

## 2019-04-11 NOTE — Progress Notes (Signed)
Physical Therapy Note  Patient Details  Name: Andrew Bruce MRN: [id number] Date of Birth: 07/13/1965 Today's Date: 04/11/2019    Patient in recliner in the room upon PT arrival. Patient requested to rest today due to increased fatigue and feeling comfortable sitting in the chair. Discussed preference of recliner versus TIS w/c, patient stated he liked the recliner better. He also reported that he will have his trach out this week and is very excited for that. Offered ambulation to the bathroom, seated exercises, or other therapies. Patient declined, requesting to rest. Patient missed 1 hour of skilled PT due to fatigue.  Lexianna Weinrich L Tiann Saha PT, DPT  04/11/2019, 1:10 PM

## 2019-04-11 NOTE — Progress Notes (Addendum)
Feet washed with soap and water. Positive pulses noted. Patient refuses glucerna prefers meals.

## 2019-04-12 ENCOUNTER — Inpatient Hospital Stay (HOSPITAL_COMMUNITY): Payer: Self-pay | Admitting: Occupational Therapy

## 2019-04-12 ENCOUNTER — Inpatient Hospital Stay (HOSPITAL_COMMUNITY): Payer: Self-pay

## 2019-04-12 ENCOUNTER — Inpatient Hospital Stay (HOSPITAL_COMMUNITY): Payer: Self-pay | Admitting: Speech Pathology

## 2019-04-12 LAB — GLUCOSE, CAPILLARY
Glucose-Capillary: 92 mg/dL (ref 70–99)
Glucose-Capillary: 128 mg/dL — ABNORMAL HIGH (ref 70–99)
Glucose-Capillary: 107 mg/dL — ABNORMAL HIGH (ref 70–99)
Glucose-Capillary: 215 mg/dL — ABNORMAL HIGH (ref 70–99)

## 2019-04-12 LAB — CBC
HCT: 38.7 % — ABNORMAL LOW (ref 39.0–52.0)
Hemoglobin: 12.1 g/dL — ABNORMAL LOW (ref 13.0–17.0)
MCH: 26.4 pg (ref 26.0–34.0)
MCHC: 31.3 g/dL (ref 30.0–36.0)
MCV: 84.3 fL (ref 80.0–100.0)
Platelets: 503 10*3/uL — ABNORMAL HIGH (ref 150–400)
RBC: 4.59 MIL/uL (ref 4.22–5.81)
RDW: 16.9 % — ABNORMAL HIGH (ref 11.5–15.5)
WBC: 7.5 10*3/uL (ref 4.0–10.5)
nRBC: 0 % (ref 0.0–0.2)

## 2019-04-12 LAB — BASIC METABOLIC PANEL
Anion gap: 12 (ref 5–15)
BUN: 14 mg/dL (ref 6–20)
CO2: 26 mmol/L (ref 22–32)
Calcium: 9.3 mg/dL (ref 8.9–10.3)
Chloride: 102 mmol/L (ref 98–111)
Creatinine, Ser: 0.4 mg/dL — ABNORMAL LOW (ref 0.61–1.24)
GFR calc Af Amer: >60 mL/min (ref >60)
GFR calc non Af Amer: >60 mL/min (ref >60)
Glucose, Bld: 82 mg/dL (ref 70–99)
Potassium: 3.3 mmol/L — ABNORMAL LOW (ref 3.5–5.1)
Sodium: 140 mmol/L (ref 135–145)

## 2019-04-12 MED ORDER — METFORMIN HCL 500 MG PO TABS
500.0000 mg | ORAL_TABLET | Freq: Two times a day (BID) | ORAL | Status: DC
  Administered 2019-04-12 – 2019-04-19 (×16): 500 mg via ORAL
  Filled 2019-04-12 (×16): qty 1

## 2019-04-12 MED ORDER — GLIPIZIDE 5 MG PO TABS
2.5000 mg | ORAL_TABLET | Freq: Every day | ORAL | Status: DC
  Administered 2019-04-13 – 2019-04-19 (×7): 2.5 mg via ORAL
  Filled 2019-04-12 (×3): qty 0.5
  Filled 2019-04-12: qty 1
  Filled 2019-04-12 (×5): qty 0.5

## 2019-04-12 NOTE — Progress Notes (Signed)
RT removed trach per MD order. Stoma covered with gauze. Patient tolerated well. Sat 96% on RA. RT will continue to monitor.

## 2019-04-12 NOTE — Progress Notes (Signed)
Andrew Bruce is doing fine after decannulation earlier today. His vital signs are stable and he is clearing secretions when needed. Will continue to follow up.

## 2019-04-12 NOTE — Progress Notes (Signed)
Physical Therapy Session Note  Patient Details  Name: Andrew Bruce MRN: [id number] Date of Birth: Aug 16, 1965  Today's Date: 04/12/2019 PT Individual Time: 9509-3267 PT Individual Time Calculation (min): 72 min   Short Term Goals: Week 3:  PT Short Term Goal 1 (Week 3): Patient will perform bed mobility with supervision without use of bed rails. PT Short Term Goal 2 (Week 3): Patient will perform basic transfers with supervision using LRAD. PT Short Term Goal 3 (Week 3): Patient will sit OOB at least 30-60 min 2x per day for improved sitting tolerance. PT Short Term Goal 4 (Week 3): Patient will perform 2 steps with 1 rail with min A.  Skilled Therapeutic Interventions/Progress Updates:     Patient in recliner in room upon PT arrival. Patient alert and agreeable to PT session. Patient reported mild L toe and stomach pain during session, RN made aware. PT provided repositioning, rest breaks, and distraction as pain interventions throughout session. Assessed patient's toes at beginning of session. Noted dried blood from L third toe. Cleaned toes with salene cloth and allowed to air dry. Discussed family education and scheduled with is wife and his daughter for tomorrow from 25am-2pm to allow time for all therapies, scheduling notified. Donned B Darco post-op shoes with max A and applied gauze around toes to monitor for bleeding during gait activities. Wound care nurse came during session and assessed patient's sacral wound. PT assisted with mobility during assessment.   Therapeutic Activity: Bed Mobility: Patient performed supine to/from sit with supervision with use of bed rails to sit up in a flat bed. Provided verbal cues for pushing through elbows and hands to sit up rather than reaching for rails. He performed rolling R with mod I using the bed rail during wound care assessment. He donned pants sitting EOB to thread LEs with mod-max A due to concern for his toes and pulled them up in  standing with supervision and CGA for balance. Transfers: Patient performed sit to/from stand x6 using a RW and stand pivot x1 without an AD with CGA-close supervision. Provided verbal cues for hand placement and reaching back to sit. Patient performed a simulated van height car transfer with foam pad and a pillow placed on the car seat for elevated seat height with supervision using RW. Provided cues for safe technique of sitting then bringing LEs into the vehicle rather than stepping in.  Gait Training:  Patient ambulated ~130 feet, ~80 feet, and 140 feet using RW with close supervision. Ambulated with equal step length and foot placement this session, mild forward trunk lean, decreased R hip and knee flexion in swing with fatigue, and decreased R weigh shift with fatigue. Provided verbal cues for erect posture, staying close to the RW for safety, and increased R step height. Patient ascended/descended 4 steps using R rail with B UE support with CGA, except min A on first step due to mild knee buckling on L. Performed step-to gait pattern leading with L. Provided cues for technique and sequencing.   Discussed home set up including 7 STE with R rail and potentially putting in a second rail prior to d/c and d/c planning. Patient reports that he misses his family and would like to go home with some assist rather than stay longer to obtain independent mobility prior to d/c. Will discuss with rehab team in team conference tomorrow.   Patient in bed at end of session with breaks locked, bed alarm set, and all needs within reach. Assessed patient's toes  after gait trials and at end of session. No signs of change or bleeding throughout session. Left toes open to air at end of session.   Therapy Documentation Precautions:  Precautions Precautions: Fall, Other (comment) Precaution Comments: PMSV at all times, PEG;  NPO Restrictions Weight Bearing Restrictions: No Other Position/Activity Restrictions: HOB  elevated 30 degrees at all times    Therapy/Group: Individual Therapy  Messiah Ahr L Selina Tapper PT, DPT  04/12/2019, 5:23 PM

## 2019-04-12 NOTE — Progress Notes (Signed)
Occupational Therapy Session Note  Patient Details  Name: Andrew Bruce MRN: [id number] Date of Birth: [birth year]  Today's Date: 04/12/2019 OT Individual Time: 0858-1000 OT Individual Time Calculation (min): 62 min    Short Term Goals: Week 3:  OT Short Term Goal 1 (Week 3): patient will complete bathing and dressing CS/set up seated OT Short Term Goal 2 (Week 3): patient will complete functional transfers and ambulation with LRAD CS OT Short Term Goal 3 (Week 3): patient will improve standing tolerance to 10 minutes with light activity  Skilled Therapeutic Interventions/Progress Updates:    Patient seated in recliner, ready for therapy and denies pain.  Ambulation in room without AD with CGA/CS to/from bed, recliner, toilet and shower bench.  Shower completed with set up and CS.  Dressing completed seated on bedside commode - set up for [facility] shirt, set up for underwear and pants, CM in stance CS.  toileting CS.  He is able to donn loose fitting socks with set up but requires assist for post op shoes.  Oral care completed in stance with CS/set up.   Completed standing towel slides bilateral UEs, dowel ex elbow/wrist/shoulder, theraputty provided and practiced with emphasis on right hand dexterity and strength, dexterity tasks with moderate difficulty.  He returned to recliner at close of session, call bell and tray table in reach.    Therapy Documentation Precautions:  Precautions Precautions: Fall, Other (comment) Precaution Comments: PMSV at all times, PEG;  NPO Restrictions Weight Bearing Restrictions: No Other Position/Activity Restrictions: HOB elevated 30 degrees at all times General:   Vital Signs: Therapy Vitals Temp: 99.1 F (37.3 C) Temp Source: Oral Pulse Rate: (!) 105 Resp: 16 BP: 102/85 Patient Position (if appropriate): Lying Oxygen Therapy SpO2: 97 % O2 Device: Room Air FiO2 (%): 21 %  Therapy/Group: Individual Therapy  [name] 04/12/2019, 7:48 AM

## 2019-04-12 NOTE — Progress Notes (Signed)
Speech Language Pathology Daily Session Note  Patient Details  Name: Andrew Bruce MRN: [id number] Date of Birth: 1965/05/23  Today's Date: 04/12/2019 SLP Individual Time: 1115-1200, 1230-1250 SLP Individual Time Calculation (min): 45 min, 20 min  Short Term Goals: Week 3: SLP Short Term Goal 1 (Week 3): Pt will consume current diet with minimal overt s/s of dysphagia or aspiration and supervision cues for use of compensatory strategies. SLP Short Term Goal 2 (Week 3): Pt will consume trials of thin liquids with minimal overt s/s of aspiration and no decline in respiratory status to demonstrate readiness for repeat instrumental study. SLP Short Term Goal 3 (Week 3): Pt will phonate to achieve ~ 75% intelligibility at the phrase level with Min A cues. SLP Short Term Goal 4 (Week 3): Pt will complete 3 sets of 10 pharyngeal strengthening exercises (effortful swallow) with supervision cues. SLP Short Term Goal 5 (Week 3): With Min A multimodal cues, pt will donn and doff PMSV correctly in 5 out of 10 opportunities. SLP Short Term Goal 6 (Week 3): Given Min A cues, pt will complete 5 repetitions with EMST 150 device set at 30 cmH20 and  5 repeitions with IMST device set at 10 cmH20.  Skilled Therapeutic Interventions: AM session: Pt was seen for skilled ST intervention targeting aforementioned goals. Pt was pleasant and cooperative, interpreter present to facilitate communication. Respiratory therapy had just decannulated pt, so this session was spent on education and answering pt's questions. SLP facilitated session by providing visual and verbal information regarding decannulation, results and recommendations, from most recent MBS, and exercises for pharyngeal strengthening.  Interpreter wrote directions for [place] and effortful swallow in Spanish to maximize carryover between sessions. Pt was encouraged to hold on using EMST and IMST until stoma has healed. Pt was concerned about aspiration now  that he has been decannulated. SLP explained swallow function and trach placement, and rationale for double swallow with po intake.   PM session: Pt was observed during lunch with dysphagia 3 (mechanical soft) solids and thin liquids via cup sip. Safe swallow precautions updated and posted at [facility], and reviewed with pt. One cough response was noted during the meal. Pt was encouraged to place fingertips over bandaged stoma when coughing. Pt appears to tolerate current diet.  Pt was left in recliner with all needs within reach. Continue ST per current plan of care.    Pain Pain Assessment Pain Scale: 0-10 Pain Score: 0-No pain  Therapy/Group: Individual Therapy   Jacky Hartung B. Murvin Natal, [facility], CCC-SLP Speech Language Pathologist   Leigh Aurora 04/12/2019, 12:50 PM

## 2019-04-12 NOTE — Progress Notes (Signed)
[facility] PHYSICAL MEDICINE & REHABILITATION PROGRESS NOTE   Subjective/Complaints:  Pt reports hasn't been able to eat breakfast yet- on thin liquids.  Capped entire weekend with no issues.  Wondering about diet/trach.  Pharmacy asking about trying to switch to metformin and glipizide  ROS:  Pt denies SOB, abd pain, CP, N/V/C/D, and vision changes   Objective:   No results found. Recent Labs    04/12/19 0704  WBC 7.5  HGB 12.1*  HCT 38.7*  PLT 503*   No results for input(s): NA, K, CL, CO2, GLUCOSE, BUN, CREATININE, CALCIUM in the last 72 hours.  Intake/Output Summary (Last 24 hours) at 04/12/2019 1044 Last data filed at 04/12/2019 0700 Gross per 24 hour  Intake 570 ml  Output 400 ml  Net 170 ml     Physical Exam: Vital Signs Blood pressure 102/85, pulse 87, temperature 99.1 F (37.3 C), temperature source Oral, resp. rate 16, height 5\' 6"  (1.676 m), weight 68.9 kg, SpO2 96 %.  Gen: awake, alert, appropriate, laying supine, NAD HEENT: trach capped CV: RRR Pulm: CTA B/L- good air movement. GI: soft, NT, (+)PEG; (+)BS Skin: Dry gangrene to bilateral toes, L 1st toe better; otherwise much improved Psych: appropriate Musc: No edema in extremities.  No tenderness in extremities. Neuro: Ox3 Motor: Bilateral upper extremities: 4/5 proximal distal Bilateral lower extremities: 4 --4/5 proximal to distal  Assessment/Plan: 1. 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  Physiatrist is providing close team supervision and 24 hour management of active medical problems listed below.  Physiatrist and rehab team continue to assess barriers to discharge/monitor patient progress toward functional and medical goals  Care Tool:  Bathing    Body parts bathed by patient: Right arm, Left arm, Chest, Abdomen, Face, Front perineal  area, Buttocks, Right upper leg, Left upper leg, Right lower leg, Left lower leg   Body parts bathed by helper: Front perineal area, Buttocks, Right upper leg, Left upper leg, Right lower leg, Left lower leg     Bathing assist Assist Level: Minimal Assistance - Patient > 75%     Upper Body Dressing/Undressing Upper body dressing   What is the patient wearing?: Pull over shirt    Upper body assist Assist Level: Supervision/Verbal cueing    Lower Body Dressing/Undressing Lower body dressing      What is the patient wearing?: Pants     Lower body assist Assist for lower body dressing: Minimal Assistance - Patient > 75%     Toileting Toileting Toileting Activity did not occur (Clothing management and hygiene only): N/A (no void or bm)(foley)  Toileting assist Assist for toileting: Supervision/Verbal cueing     Transfers Chair/bed transfer  Transfers assist  Chair/bed transfer activity did not occur: Safety/medical concerns(limited by dizziness, chest pain)  Chair/bed transfer assist level: Minimal Assistance - Patient > 75% Chair/bed transfer assistive device:   Ambulation assist   Ambulation activity did not occur: Safety/medical concerns(limited by dizziness, chest pain)  Assist level: Minimal Assistance - Patient > 75% Assistive device: No Device Max distance: 120'   Walk 10 feet activity   Assist  Walk 10 feet activity did not occur: Safety/medical concerns(limited by dizziness, chest pain)  Assist level: Minimal Assistance - Patient > 75% Assistive device: No Device   Walk 50 feet activity   Assist Walk 50 feet with 2 turns  activity did not occur: Safety/medical concerns(limited by dizziness, chest pain)  Assist level: Minimal Assistance - Patient > 75% Assistive device: No Device    Walk 150 feet activity   Assist Walk 150 feet activity did not occur: Safety/medical concerns         Walk 10 feet on uneven  surface  activity   Assist Walk 10 feet on uneven surfaces activity did not occur: Safety/medical concerns(limited by dizziness, chest pain)         Wheelchair     Assist     Wheelchair activity did not occur: Safety/medical concerns(limited by dizziness, chest pain)         Wheelchair 50 feet with 2 turns activity    Assist    Wheelchair 50 feet with 2 turns activity did not occur: Safety/medical concerns(limited by dizziness, chest pain)       Wheelchair 150 feet activity     Assist  Wheelchair 150 feet activity did not occur: Safety/medical concerns(limited by dizziness, chest pain)       Assessment/plan: 1.  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   Continue CIR  2.  Antithrombotics: -DVT/anticoagulation:  Pharmaceutical: Other (comment)--Eliquis             -antiplatelet therapy: N/A 3. Pain Management: Added oxycodone prn for sacral pain.   3/28- pain controlled 4. Mood: LCSW to follow for evaluation and support.              -antipsychotic agents: N/A 5. Neuropsych: This patient appears to be capable of making decisions on his own behalf. 6. Sacral decubitus/Skin/Wound Care  Sacral ulcer  Continue air mattress overlay. 7. Fluids/Electrolytes/Nutrition: Monitor I/Os 8. T2DM with hyperglycemia: Hgb A1c- 11.7  Levemir bid with SSI   Consulted dietician to start bolus tube feeds.    CBG (last 3)  Recent Labs    04/11/19 1638 04/11/19 2126 04/12/19 0628  GLUCAP 117* 190* 92   Novolog 3 units- will monitor  Decrease Levemir to 20 units BID   Labile, but?  Stabilizing.  Patient also appears to be eating food from home  3/29- will switch to Glipizide and Metformin since has to pay out of pocket for meds  Continue to monitor 9. Dizziness: Changed Cardura to bedtime to avoid SE. Will monitor orthostatic Bps  Improving  10. 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  11. VDRF s/p Trach: Continue  ATC.   Added Duonebs QID  Plan for decannulation this week 12. Resting tachycardia: Due to deconditioning. No BB due to soft BP/orthostatic symptoms.  Monitor for now.    Vitals:   04/12/19 0552 04/12/19 0758  BP: 102/85   Pulse: (!) 105 87  Resp:  16  Temp:    SpO2:  96%   Labile, but?  Improving on 3/28 13. Gangrenous changes bilateral feet: off loading shoes.  3/29- will call Vascular to see toes- hasn't been seen by vascular yet.   Continue to monitor.  14. Abnormal LFTs:   3/10- mild elevation of ALT only- will con't to monitor 15. ABLA: Due to critical illness.   Hemoglobin 11.4 on 3/22, labs ordered for tomorrow 16. Right Vocal fold immobility, dysphonia:s/p transnasal fiberoptic laryngoscopy by ENT on 3/9 but was aborted due to gagging and vomiting.   3/23- injected vocal folds with Prolaryn R>L and kenalog on R due to being tethetered 17.  Labile blood pressure  Monitor for trend  LOS: 20 days A FACE TO  FACE EVALUATION WAS PERFORMED  Cincere Zorn 04/12/2019, 10:44 AM

## 2019-04-12 NOTE — Consult Note (Signed)
WOC Nurse wound follow up Patient receiving care in [facility] 4M13.  Interpreter in room. Wound type: stage 3 coccyx wound Measurement: 4.0 cm x 2.0 cm x 1.2 cm Wound bed: slick pink Drainage (amount, consistency, odor) serosanginous on foam dressing Periwound: intact Dressing procedure/placement/frequency: Current orders in for santyl and saline moistened gauze daily, continue with this treatment.  Monitor the wound area(s) for worsening of condition such as: Signs/symptoms of infection,  Increase in size,  Development of or worsening of odor, Development of pain, or increased pain at the affected locations.  Notify the medical team if any of these develop.   Silvio Pate, RN, [facility]

## 2019-04-13 ENCOUNTER — Encounter (HOSPITAL_COMMUNITY): Payer: Self-pay | Admitting: Occupational Therapy

## 2019-04-13 ENCOUNTER — Ambulatory Visit (HOSPITAL_COMMUNITY): Payer: Self-pay

## 2019-04-13 ENCOUNTER — Inpatient Hospital Stay (HOSPITAL_COMMUNITY): Payer: Self-pay

## 2019-04-13 ENCOUNTER — Encounter (HOSPITAL_COMMUNITY): Payer: Self-pay | Admitting: Speech Pathology

## 2019-04-13 LAB — GLUCOSE, CAPILLARY
Glucose-Capillary: 113 mg/dL — ABNORMAL HIGH (ref 70–99)
Glucose-Capillary: 92 mg/dL (ref 70–99)
Glucose-Capillary: 95 mg/dL (ref 70–99)
Glucose-Capillary: 152 mg/dL — ABNORMAL HIGH (ref 70–99)

## 2019-04-13 MED ORDER — HYDROCODONE-ACETAMINOPHEN 5-325 MG PO TABS
1.0000 | ORAL_TABLET | Freq: Four times a day (QID) | ORAL | Status: DC | PRN
  Administered 2019-04-13: 1 via ORAL
  Administered 2019-04-14 – 2019-04-16 (×3): 2 via ORAL
  Administered 2019-04-18: 1 via ORAL
  Filled 2019-04-13: qty 2
  Filled 2019-04-13 (×2): qty 1
  Filled 2019-04-13 (×2): qty 2

## 2019-04-13 MED ORDER — POTASSIUM CHLORIDE CRYS ER 20 MEQ PO TBCR
40.0000 meq | EXTENDED_RELEASE_TABLET | Freq: Two times a day (BID) | ORAL | Status: AC
  Administered 2019-04-13 (×2): 40 meq via ORAL
  Filled 2019-04-13 (×2): qty 2

## 2019-04-13 NOTE — Progress Notes (Addendum)
[facility] PHYSICAL MEDICINE & REHABILITATION PROGRESS NOTE   Subjective/Complaints:  Pt reports decannulated yesterday- did great.   Feet hurting him a little  Can't pull PEG yet- placed 2/24  ROS:   Pt denies SOB, abd pain, CP, N/V/C/D, and vision changes   Objective:   No results found. Recent Labs    04/12/19 0704  WBC 7.5  HGB 12.1*  HCT 38.7*  PLT 503*   Recent Labs    04/12/19 0704  NA 140  K 3.3*  CL 102  CO2 26  GLUCOSE 82  BUN 14  CREATININE 0.40*  CALCIUM 9.3    Intake/Output Summary (Last 24 hours) at 04/13/2019 0902 Last data filed at 04/13/2019 0500 Gross per 24 hour  Intake 210 ml  Output 300 ml  Net -90 ml     Physical Exam: Vital Signs Blood pressure (!) 145/99, pulse 94, temperature 98.9 F (37.2 C), temperature source Oral, resp. rate 16, height 5\' 6"  (1.676 m), weight 69.3 kg, SpO2 98 %.  Gen: awake, alert, appropriate, talking via interpretor, NAD HEENT: decannulated- trach site looks good CV: RRR- no JVD Pulm: CTA B/L- good air movement- no upper airway sounds. GI: soft, NT, ND, (+)BS- PEG Skin: dry gangrene on toes- 1st L toe still looks a little concerning; other toes sloughing a little Psych: appropriate Musc: No edema in extremities.  No tenderness in extremities. Neuro: Ox3 Motor: Bilateral upper extremities: 4/5 proximal distal Bilateral lower extremities: 4 --4/5 proximal to distal  Assessment/Plan: 1. 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  Physiatrist is providing close team supervision and 24 hour management of active medical problems listed below.  Physiatrist and rehab team continue to assess barriers to discharge/monitor patient progress toward functional and medical goals  Care Tool:  Bathing    Body parts bathed by patient: Right arm, Left arm, Chest,  Abdomen, Face, Front perineal area, Buttocks, Right upper leg, Left upper leg, Right lower leg, Left lower leg   Body parts bathed by helper: Front perineal area, Buttocks, Right upper leg, Left upper leg, Right lower leg, Left lower leg     Bathing assist Assist Level: Minimal Assistance - Patient > 75%     Upper Body Dressing/Undressing Upper body dressing   What is the patient wearing?: Pull over shirt    Upper body assist Assist Level: Supervision/Verbal cueing    Lower Body Dressing/Undressing Lower body dressing      What is the patient wearing?: Pants     Lower body assist Assist for lower body dressing: Minimal Assistance - Patient > 75%     Toileting Toileting Toileting Activity did not occur (Clothing management and hygiene only): N/A (no void or bm)(foley)  Toileting assist Assist for toileting: Supervision/Verbal cueing     Transfers Chair/bed transfer  Transfers assist  Chair/bed transfer activity did not occur: Safety/medical concerns(limited by dizziness, chest pain)  Chair/bed transfer assist level: Supervision/Verbal cueing Chair/bed transfer assistive device:   Ambulation assist   Ambulation activity did not occur: Safety/medical concerns(limited by dizziness, chest pain)  Assist level: Supervision/Verbal cueing Assistive device: Walker-rolling Max distance: 130'   Walk 10 feet activity   Assist  Walk 10 feet activity did not occur: Safety/medical concerns(limited by dizziness, chest pain)  Assist level: Supervision/Verbal cueing Assistive device: Walker-rolling   Walk 50 feet activity   Assist  Walk 50 feet with 2 turns activity did not occur: Safety/medical concerns(limited by dizziness, chest pain)  Assist level: Supervision/Verbal cueing Assistive device: Walker-rolling    Walk 150 feet activity   Assist Walk 150 feet activity did not occur: Safety/medical concerns         Walk 10 feet on  uneven surface  activity   Assist Walk 10 feet on uneven surfaces activity did not occur: Safety/medical concerns(limited by dizziness, chest pain)         Wheelchair     Assist     Wheelchair activity did not occur: Safety/medical concerns(limited by dizziness, chest pain)         Wheelchair 50 feet with 2 turns activity    Assist    Wheelchair 50 feet with 2 turns activity did not occur: Safety/medical concerns(limited by dizziness, chest pain)       Wheelchair 150 feet activity     Assist  Wheelchair 150 feet activity did not occur: Safety/medical concerns(limited by dizziness, chest pain)       Assessment/plan: 1.  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   Continue CIR  2.  Antithrombotics: -DVT/anticoagulation:  Pharmaceutical: Other (comment)--Eliquis             -antiplatelet therapy: N/A 3. Pain Management: Added oxycodone prn for sacral pain.   3/30- pain controlled- will switch to Norco for pain 4. Mood: LCSW to follow for evaluation and support.              -antipsychotic agents: N/A 5. Neuropsych: This patient appears to be capable of making decisions on his own behalf. 6. Sacral decubitus/Skin/Wound Care  Sacral ulcer  Continue air mattress overlay. 7. Fluids/Electrolytes/Nutrition: Monitor I/Os 8. T2DM with hyperglycemia: Hgb A1c- 11.7  Levemir bid with SSI   Consulted dietician to start bolus tube feeds.    CBG (last 3)  Recent Labs    04/12/19 1733 04/12/19 2053 04/13/19 0637  GLUCAP 107* 215* 113*   Novolog 3 units- will monitor  Decrease Levemir to 20 units BID   Labile, but?  Stabilizing.  Patient also appears to be eating food from home  3/29- will switch to Glipizide and Metformin since has to pay out of pocket for meds  3/30- BGs doing better- will con't meds  Continue to monitor 9. Dizziness: Changed Cardura to bedtime to avoid SE. Will monitor orthostatic  Bps  Improving  10. 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   Added Duonebs QID  Plan for decannulation this week  3/30- decannulated- tolerating and doing great 12. Resting tachycardia: Due to deconditioning. No BB due to soft BP/orthostatic symptoms.  Monitor for now.    Vitals:   04/13/19 0525 04/13/19 0826  BP: (!) 145/99   Pulse: 94   Resp: 18 16  Temp: 98.9 F (37.2 C)   SpO2: 98%    Labile up to 145/99- but overall controlled 3/30 13. Gangrenous changes bilateral feet: off loading shoes.  3/29- will call Vascular to see toes- hasn't been seen by vascular yet.   Continue to monitor.  14. Abnormal LFTs:   3/10- mild elevation of ALT only- will con't to monitor 15. ABLA: Due to critical illness.   Hemoglobin 11.4 on 3/22, labs ordered for tomorrow 16. Right Vocal fold immobility, dysphonia:s/p transnasal fiberoptic laryngoscopy by ENT on 3/9 but was aborted due to gagging and vomiting.   3/23- injected vocal folds  with Prolaryn R>L and kenalog on R due to being tethetered 17.  Labile blood pressure  Monitor for trend  3/30- as per #12 18. PEG  3/30- placed 2/24- so have to wait to pull  LOS: 21 days A FACE TO FACE EVALUATION WAS PERFORMED  Andrew Bruce 04/13/2019, 9:02 AM

## 2019-04-13 NOTE — Patient Care Conference (Signed)
Inpatient RehabilitationTeam Conference and Plan of Care Update Date: 04/13/2019   Time: 11:50 AM    Patient Name: Andrew Bruce      Medical Record Number: [id number]  Date of Birth: 23-Sep-1965 Sex: Male         Room/Bed: 4M13C/4M13C-01 Payor Info: Payor: /    Admit Date/Time:  03/23/2019  7:58 PM  Primary Diagnosis:  Embolic stroke [facility])  Patient Active Problem List   Diagnosis Date Noted  . Labile blood pressure   . Acute blood loss anemia   . Labile blood glucose   . Hypoglycemia   . Uncontrolled type 2 diabetes mellitus with hyperglycemia ([place])   . Dry gangrene ([place])   . PEG (percutaneous endoscopic gastrostomy) status ([place]) 03/24/2019  . Leukocytosis   . Embolic stroke ([place]) 58/25/1898  . Status post tracheostomy ([place])   . Pressure injury of skin 03/02/2019  . On mechanically assisted ventilation ([place])   . Goals of care, counseling/discussion   . Palliative care encounter   . ARDS (adult respiratory distress syndrome) ([place])   . Cerebral embolism with cerebral infarction 02/17/2019  . Acute respiratory failure with hypoxia ([place])   . Pneumothorax, right   . COVID-19 02/06/2019    Expected Discharge Date: Expected Discharge Date: 04/20/19  Team Members Present: Physician leading conference: Dr. Courtney Heys Care Coodinator Present: Loralee Pacas, LCSWA;Genie Fay Bagg, RN, MSN Nurse Present: Other (comment)(Kristy Lovena Le, RN) PT Present: Apolinar Junes, PT OT Present: Elisabeth Most, OT SLP Present: Colon Flattery, SLP PPS Coordinator present : Gunnar Fusi, SLP     Current Status/Progress Goal Weekly Team Focus  Bowel/Bladder   continent of bowel & bladder, LBM 3/27  remain continent  monitor & assist as needed   Swallow/Nutrition/ Hydration   Tolerating mechanical soft diet and thin liquids via cup (no straws)  Min A dysphagia therapy  diet tolerance and education   ADL's   bathing and dressing set up/CS (with exception of post op shoes), functional  ambulation and transfers CS with AD, CGA without AD  CS/DS  family education, R NMRE,   Mobility   Supervision bed mobility, transfers supervision with RW and CGA without AD, gait >150 ft with supervision with RW and CGA-min A without RW, min A-CGA 4-6" steps with 1 rail  Supervision overall  Strengthening/ROM, R side NMR, functional mobility, gait and stair training, balance, patient/caregiver education   Communication   communicates verbally with >75% intelligibility. Now decannulated.  Min A  education regarding improving intelligibility   Safety/Cognition/ Behavioral Observations            Pain   seldom c/o pain, has tylenol & oxycodone PRN  pain scale <3/10  assess & treat as neeed   Skin   coccyx unstageable pressure injury, necrotic toes, all look to be healing, trach stoma site dry drsg  no new areas of break down or infection  assess Q shift    Rehab Goals Patient on target to meet rehab goals: Yes *See Care Plan and progress notes for long and short-term goals.     Barriers to Discharge  Current Status/Progress Possible Resolutions Date Resolved   Nursing                  PT  Home environment access/layout  Patient reports that he has 7 STE his home with 1 rail  currently only able to perform 4 steps due to decreased activity tolerance. Patient now on RA during sessions.  OT                  SLP                SW Other (comments);Trach Uninsured            Discharge Planning/Teaching Needs:  D/c to home with support from his wife, children, and various family members  Family education as recommended by therapy   Team Discussion: MD doing well, decannulated yesterday, feet hurting, cannot DC PEG until 6 weeks post op, toes sloughing, has DM, BS good.  RN BM 3/27, miralax given, cont B/B.  OT shower and dressing S, needs help with post op shoes.  PT using walker S, S 150' RW, S bed, stairs min/CGA 8 steps with 1 rail.  SLP soft diet/thins, knows precautions,  met goals speech perspective, comm effectively.  Wants to go home.   Revisions to Treatment Plan: N/A     Medical Summary Current Status: had BM (LBM 3/27)- gave miralax already; continent; toes doing better Weekly Focus/Goal: shower/dress Supervision- can't bend over to put on post-op shoes; working on R shoulder, coordination- esp RUE;  Barriers to Discharge: Decreased family/caregiver support;Insurance for SNF coverage;Home enviroment access/layout;Medical stability  Barriers to Discharge Comments: PEG can't be removed until 1+ weeks placed 2/24 Possible Resolutions to Barriers: PT- using RW for balance- Supervison bed, and transfers; 150 ft RW supervison; stairs- now CGA 8 steps with 1 rail-   Continued Need for Acute Rehabilitation Level of Care: The patient requires daily medical management by a physician with specialized training in physical medicine and rehabilitation for the following reasons: Direction of a multidisciplinary physical rehabilitation program to maximize functional independence : Yes Medical management of patient stability for increased activity during participation in an intensive rehabilitation regime.: Yes Analysis of laboratory values and/or radiology reports with any subsequent need for medication adjustment and/or medical intervention. : Yes   I attest that I was present, lead the team conference, and concur with the assessment and plan of the team.   Jodell Cipro M 04/13/2019, 8:05 PM   Team conference was held via web/ teleconference due to [place] - 19

## 2019-04-13 NOTE — Progress Notes (Signed)
Occupational Therapy Session Note  Patient Details  Name: Andrew Bruce MRN: [id number] Date of Birth: September 17, 1965  Today's Date: 04/13/2019 OT Individual Time: 1130-1200 OT Individual Time Calculation (min): 30 min    Short Term Goals: Week 2:  OT Short Term Goal 1 (Week 2): Pt will perform 3/3 toileting tasks wtih min  A OT Short Term Goal 1 - Progress (Week 2): Met OT Short Term Goal 2 (Week 2): Pt will perform bed mobility in prep for ADL with supervision OT Short Term Goal 2 - Progress (Week 2): Met OT Short Term Goal 3 (Week 2): Pt will stand to perform one grooming task at the sink with min A OT Short Term Goal 3 - Progress (Week 2): Met OT Short Term Goal 4 (Week 2): Pt will don LB clothing with min A OT Short Term Goal 4 - Progress (Week 2): Met  Skilled Therapeutic Interventions/Progress Updates:    1:1 Pt was in bed when arrived. Pt's wife and daughter were present for family education. Provided education on self care tasks including functional ambulation around the room with RW and post op shoes, fatigue, energy conservation, showering, recommendation for a shower chair (they will purchase on their own), toileting and transfers, and d/c planning. Wife and daughter very supportive and wife assisted with ADL and feels comfortable with ADL tasks with pt. Pt left resting in the recliner. Nursing notified to come and change wound bandage.   Therapy Documentation Precautions:  Precautions Precautions: Fall, Other (comment) Precaution Comments: PMSV at all times, PEG;  NPO Restrictions Weight Bearing Restrictions: No Other Position/Activity Restrictions: HOB elevated 30 degrees at all times General:   Vital Signs: Therapy Vitals Temp: 98.9 F (37.2 C) Temp Source: Oral Pulse Rate: 99 Resp: 18 BP: (!) 141/91 Patient Position (if appropriate): Lying Oxygen Therapy SpO2: 97 % O2 Device: Room Air Pain: No c/o pain in session    Therapy/Group: Individual  Therapy  [name] [facility] 04/13/2019, 4:00 PM

## 2019-04-13 NOTE — Progress Notes (Signed)
Speech Language Pathology Daily Session Note  Patient Details  Name: Andrew Bruce MRN: [id number] Date of Birth: 08/21/1965  Today's Date: 04/13/2019 SLP Individual Time: 1050-1120 SLP Individual Time Calculation (min): 30 min  Short Term Goals: Week 3: SLP Short Term Goal 1 (Week 3): Pt will consume current diet with minimal overt s/s of dysphagia or aspiration and supervision cues for use of compensatory strategies. SLP Short Term Goal 2 (Week 3): Pt will consume trials of thin liquids with minimal overt s/s of aspiration and no decline in respiratory status to demonstrate readiness for repeat instrumental study. SLP Short Term Goal 3 (Week 3): Pt will phonate to achieve ~ 75% intelligibility at the phrase level with Min A cues. SLP Short Term Goal 4 (Week 3): Pt will complete 3 sets of 10 pharyngeal strengthening exercises (effortful swallow) with supervision cues. SLP Short Term Goal 5 (Week 3): With Min A multimodal cues, pt will donn and doff PMSV correctly in 5 out of 10 opportunities. SLP Short Term Goal 6 (Week 3): Given Min A cues, pt will complete 5 repetitions with EMST 150 device set at 30 cmH20 and  5 repetitions with IMST device set at 10 cmH20.  Skilled Therapeutic Interventions: Pt was seen for skilled ST intervention targeting family education regarding aforementioned goals. Pt was pleasant and cooperative, and family was appropriately engaged. SLP facilitated session by providing education regarding significant progress in speech therapy, pharyngeal strengthening exercises, diet tolerance and safe swallow precautions. Also reiterated not using IMST or EMST until stoma heals. SLP provided opportunity to ask questions.    Pt was left in bed with alarm on, all needs within reach, family present. Continue ST per current plan of care, changing priority to 3. Team conference completed.   Pain Pain Assessment Pain Scale: 0-10 Pain Score: 0-No pain  Therapy/Group: Individual  Therapy   Exodus Kutzer B. Murvin Natal, [facility], CCC-SLP Speech Language Pathologist  Leigh Aurora 04/13/2019, 11:41 AM

## 2019-04-13 NOTE — Progress Notes (Signed)
Physical Therapy Weekly Progress Note  Patient Details  Name: Andrew Bruce MRN: [id number] Date of Birth: 1965/03/02  Beginning of progress report period: April 07, 2019 End of progress report period: April 13, 2019  Today's Date: 04/13/2019 PT Individual Time: 0902-1000 and 1304-1404 PT Individual Time Calculation (min): 58 min and 60 min   Patient has met 4 of 4 short term goals.  Patient is making great progress with physical therapy. He is maintaining O2 saturations on RA since decannulation on 3/29. He currently requires supervision for bed mobility, transfers with supervision with RW and CGA without AD, gait >150 ft with supervision with RW and CGA-min A without RW, and requires min A-CGA 8-6" steps with 1 rail using B UEs.  Patient continues to demonstrate the following deficits muscle weakness and muscle joint tightness, decreased cardiorespiratoy endurance, abnormal tone, ataxia and decreased coordination and decreased sitting balance, decreased standing balance, decreased postural control and decreased balance strategies and therefore will continue to benefit from skilled PT intervention to increase functional independence with mobility.  Patient progressing toward long term goals..  Plan of care revisions: upgraded LTGs to supervision overall, see Care Plan for details, and moved patient's discharge date up to next week due to patient's progress with therapies.  PT Short Term Goals Week 3:  PT Short Term Goal 1 (Week 3): Patient will perform bed mobility with supervision without use of bed rails. PT Short Term Goal 1 - Progress (Week 3): Met PT Short Term Goal 2 (Week 3): Patient will perform basic transfers with supervision using LRAD. PT Short Term Goal 2 - Progress (Week 3): Met PT Short Term Goal 3 (Week 3): Patient will sit OOB at least 30-60 min 2x per day for improved sitting tolerance. PT Short Term Goal 3 - Progress (Week 3): Met PT Short Term Goal 4 (Week 3): Patient  will perform 2 steps with 1 rail with min A. PT Short Term Goal 4 - Progress (Week 3): Met Week 4:  PT Short Term Goal 1 (Week 4): STG=LTG due to ELOS.  Skilled Therapeutic Interventions/Progress Updates:     Session 1: Patient in recliner upon PT arrival. Patient alert and agreeable to PT session. Patient reported mild-moderate L foot/toe pain during session, RN made aware. PT provided repositioning, rest breaks, and distraction as pain interventions throughout session. Interpreter was present for entire session. PT assessed patient's toes, color looking better and no drainage today. Donned B socks and Darco post-op shoes with max A for time management and energy conservation prior to mobility.  Therapeutic Activity: Bed Mobility: Patient performed supine to/from sit with supervision in the ADL bed and sit to supine with supervision in the hospital bed without use of bed functions. Provided verbal cues for pushing through his elbows to his hands to sit up. Transfers: Patient performed sit to/from stand x6 with supervision using the RW. Provided verbal cues for hand placement and reaching back to sit.  Gait Training:  Patient ambulated 100-150 feet x3 using RW with supervision. Ambulated with decreased step length, mild forward trunk flexion, and decreased R foot clearance with fatigue. Provided verbal cues for erect posture, looking ahead, and increased step height on R for safety. Patient ascended/descended 8-6" steps using R rail with B UE support and side stepping technique with CGA. Performed step-to gait pattern leading with L. Provided cues for technique and sequencing.  Patient ascended/descended 1-7" step using RW first ascending backwards then again ascending forwards with min A for balance and  management of RW. Performed step-to gait pattern as above. Provided cues for technique and sequencing. Patient attempted to pull the RW up while ascending backwards with posterior LOB, determined  that it was safer to ascend forwards.  Discussed goals for family education later today and home set up throughout session.  Patient in bed at end of session with breaks locked, bed alarm set, and all needs within reach. SPO2 >90% on RA throughout session, HR 100-120 bpm with activity.  Session 2: Patient in the recliner with his wife and daughter present for family education upon PT arrival. Patient alert and agreeable to PT session. Patient reported mild L toe pain during session, RN made aware. PT provided repositioning, rest breaks, and distraction as pain interventions throughout session. Interpreter present for entire session. Patient's wife donned B Darco shoes without cues. Educated on leaving top strap off to reduce pressure/friction on his toes.  Therapeutic Activity: Bed Mobility: Patient performed sit to supine with supervision without use of bed rails. Discussed options to buy bed rails for a regular bed with patient's daughter, however, informed them that he was able to perform all bed mobility in a regular bed today without them and encouraged them to wait to buy them at this time. Transfers: Patient performed sit to/from stand x5 with supervision using a RW with PT or patient's wife providing assist. Provided verbal cues for bringing the RW with him and reaching back to sit. Patient performed a simulated sedan height car transfer with supervision using RW and car fram. Provided cues for safe technique, educated about not stepping into the vehicle and using a sedan rather than an SUV that he would need to step-up into for safety.  Gait Training:  Patient ambulated 100-150 feet x3 using RW with supervision provided by PT or patient's wife. Ambulated as above. Provided verbal cues for safe guarding technique and educated family on cues for heel weight bearing and increased R foot clearance for safety. Discussed home set up and viewed pictures of patient's home. Determined that there are  4-3" steps with a R rail to enter the home with ~10-15 foot walk way from the car and that there are 7 steps with a R rail inside the home to the second level. Patient will be able to stay on the first level of the home upon d/c. Patient ascended/descended 4-6" steps x2 using R rail with B UE support and side stepping technique with CGA. Performed step-to gait pattern leading with L. Provided cues for technique and sequencing.  Patient ascended/descended 1-7" step x2 using RW ascending forwards with min A-CGA for balance and management of RW. Performed step-to gait pattern as above. Provided cues for technique and sequencing and safe guarding. Patient's wife provided assist on second trial of stair training activities with min cues for safe guarding to assist patient.  Educated patient on fall risk/prevention, home modifications to prevent falls, and activation of emergency services in the event of a fall during session. Discussed signs of healing for patient's toes and the possibility of autoamputation of patient's toes over time. Educated them to communicate with the medical team if this occurs and activate emergency services if there is excessive bleeding or excessive pain. Discussed orthotic options that are available to assist patient to continue to progress with mobility in the event of autoamputation. Also educated on PEG tube removal and wait period of 6 weeks post placement. Patient and his family were receptive and appreciative of all education throughout session.  Patient ambulated to/from the bathroom with his wife assisting at end of session. Both demonstrated safe technique. Educated on patient wearing non-skid socks with or without Darco shoes due to short distance and heel weight bearing when ambulating in the room. Cleared patient's wife for transfers to/from the bathroom and recliner in the room, RN made aware.  Patient in bed with his family in the room at end of session with breaks  locked and all needs within reach.    Therapy Documentation Precautions:  Precautions Precautions: Fall, Other (comment) Precaution Comments: PMSV at all times, PEG;  NPO Restrictions Weight Bearing Restrictions: No   Therapy/Group: Individual Therapy  Arthor Gorter L Khaylee Mcevoy PT, DPT  04/13/2019, 4:34 PM

## 2019-04-13 NOTE — Progress Notes (Signed)
Occupational Therapy Session Note  Patient Details  Name: Andrew Bruce MRN: [id number] Date of Birth: Mar 02, 1965  Today's Date: 04/13/2019 OT Individual Time: 1402-1430 OT Individual Time Calculation (min): 28 min    Short Term Goals: Week 3:  OT Short Term Goal 1 (Week 3): patient will complete bathing and dressing CS/set up seated OT Short Term Goal 2 (Week 3): patient will complete functional transfers and ambulation with LRAD CS OT Short Term Goal 3 (Week 3): patient will improve standing tolerance to 10 minutes with light activity  Skilled Therapeutic Interventions/Progress Updates:    Patient in bed, alert and ready for family education session.   Patient's wife, daughter and interpreter present for session.  Reviewed adl (completed this am in therapy) and questions related to self care.  Daughter confirms that they have a shower seat already available to them.  Reviewed bed mobility and safe reach / use of walker in home environment.  He demonstrated towel slides OH flexion, abd and abd with ER on wall surface.  Reviewed and practiced theraputty HEP (provided written program in Spanish)  Reviewed additional activities to complete at home to promote increased dexterity and coordination in right UE.  Reviewed safety and to avoid sharps/glass etc at this time due to risk of injury.  Patient, daughter and wife demonstrate good understanding of all materials reviewed this session.  Wife supervised/provided CGA for ambulation in room and transfer to commode during end of session where he remained at close with supervision of wife and daughter.  Nursing aware.    Therapy Documentation Precautions:  Precautions Precautions: Fall, Other (comment) Precaution Comments: PMSV at all times, PEG;  NPO Restrictions Weight Bearing Restrictions: No Other Position/Activity Restrictions: HOB elevated 30 degrees at all times General:   Vital Signs: Therapy Vitals Temp: 98.9 F (37.2 C) Temp  Source: Oral Pulse Rate: 94 Resp: 18 BP: (!) 145/99 Oxygen Therapy SpO2: 98 % O2 Device: Room Air Other Treatments:     Therapy/Group: Individual Therapy  [name] 04/13/2019, 7:41 AM

## 2019-04-14 ENCOUNTER — Inpatient Hospital Stay (HOSPITAL_COMMUNITY): Payer: Self-pay | Admitting: Occupational Therapy

## 2019-04-14 ENCOUNTER — Inpatient Hospital Stay (HOSPITAL_COMMUNITY): Payer: Self-pay | Admitting: Speech Pathology

## 2019-04-14 ENCOUNTER — Inpatient Hospital Stay (HOSPITAL_COMMUNITY): Payer: Self-pay

## 2019-04-14 LAB — GLUCOSE, CAPILLARY
Glucose-Capillary: 107 mg/dL — ABNORMAL HIGH (ref 70–99)
Glucose-Capillary: 97 mg/dL (ref 70–99)
Glucose-Capillary: 156 mg/dL — ABNORMAL HIGH (ref 70–99)
Glucose-Capillary: 119 mg/dL — ABNORMAL HIGH (ref 70–99)

## 2019-04-14 MED ORDER — ENSURE ENLIVE PO LIQD
237.0000 mL | Freq: Three times a day (TID) | ORAL | Status: DC
  Administered 2019-04-14 – 2019-04-20 (×16): 237 mL via ORAL

## 2019-04-14 NOTE — Progress Notes (Signed)
Social Work Patient ID: Andrew Bruce, male   DOB: 07-16-1965, 54 y.o.   MRN: [id number]   DME: RW sent to [employer] via parachute.  [facility] referral for PT/OT/St; faxed to ([phone]/[phone]). *SW confirmed referral was received.   Cecile Sheerer, MSW, LCSWA Office: [phone] Cell: [phone] Fax: [phone]

## 2019-04-14 NOTE — Progress Notes (Signed)
Occupational Therapy Session Note  Patient Details  Name: Andrew Bruce MRN: [id number] Date of Birth: [birth year]  Today's Date: 04/14/2019 OT Individual Time: 1100-1130 OT Individual Time Calculation (min): 30 min    Short Term Goals: Week 1:  OT Short Term Goal 1 (Week 1): Pt will tolerate out of bed for 2-3 hrs a day OT Short Term Goal 1 - Progress (Week 1): Met OT Short Term Goal 2 (Week 1): Pt will perform UB dressing wth mod A OT Short Term Goal 2 - Progress (Week 1): Met OT Short Term Goal 3 (Week 1): Pt will dress LB clothing (brief/ pants) with max A OT Short Term Goal 3 - Progress (Week 1): Met OT Short Term Goal 4 (Week 1): Pt will transfer to toilet with max A +1 OT Short Term Goal 4 - Progress (Week 1): Met Week 2:  OT Short Term Goal 1 (Week 2): Pt will perform 3/3 toileting tasks wtih min  A OT Short Term Goal 1 - Progress (Week 2): Met OT Short Term Goal 2 (Week 2): Pt will perform bed mobility in prep for ADL with supervision OT Short Term Goal 2 - Progress (Week 2): Met OT Short Term Goal 3 (Week 2): Pt will stand to perform one grooming task at the sink with min A OT Short Term Goal 3 - Progress (Week 2): Met OT Short Term Goal 4 (Week 2): Pt will don LB clothing with min A OT Short Term Goal 4 - Progress (Week 2): Met Week 3:  OT Short Term Goal 1 (Week 3): patient will complete bathing and dressing CS/set up seated OT Short Term Goal 2 (Week 3): patient will complete functional transfers and ambulation with LRAD CS OT Short Term Goal 3 (Week 3): patient will improve standing tolerance to 10 minutes with light activity  Skilled Therapeutic Interventions/Progress Updates:    1:1 Pt reports fatigue today and wanting to go easy. Pt feels like he did a lot yesterday. Pt participated in UB exercises with 1-2 lb weighted bar in supine with low weight higher reps of shoulder/ elbow, wrist flexion and extension. Transitioning to EOB with supervision. Pt perform activity  with zoom ball laterally and horizontally again with muliple reps. Pt with increased right UE grasp and control.   Also performed [facility] of trying to fasten snaps (similar to wear jeans but smaller. Pt with difficulty due to decr sensation despite visual attention to task. Pt continue to practice tying. Another [facility] exercise provided - manipulation of paper clips. Pt able to accomplish with extra time.     Left resting in the bed - pt's choice.   Therapy Documentation Precautions:  Precautions Precautions: Fall, Other (comment) Precaution Comments: PEG, Darco Post Op shoes for ambulation Restrictions Weight Bearing Restrictions: No Other Position/Activity Restrictions: HOB elevated 30 degrees at all times General:   Vital Signs:  Pain:  no c/o pain in session just fatigue.  Therapy/Group: Individual Therapy  [name] [facility] 04/14/2019, 3:08 PM

## 2019-04-14 NOTE — Progress Notes (Signed)
Speech Language Pathology Daily Session Note  Patient Details  Name: Andrew Bruce MRN: [id number] Date of Birth: 08-Dec-1965  Today's Date: 04/14/2019 SLP Individual Time: 1300-1400 SLP Individual Time Calculation (min): 60 min  Short Term Goals: Week 3: SLP Short Term Goal 1 (Week 3): Pt will consume current diet with minimal overt s/s of dysphagia or aspiration and supervision cues for use of compensatory strategies. SLP Short Term Goal 2 (Week 3): Pt will consume trials of thin liquids with minimal overt s/s of aspiration and no decline in respiratory status to demonstrate readiness for repeat instrumental study. SLP Short Term Goal 3 (Week 3): Pt will phonate to achieve ~ 75% intelligibility at the phrase level with Min A cues. SLP Short Term Goal 4 (Week 3): Pt will complete 3 sets of 10 pharyngeal strengthening exercises (effortful swallow) with supervision cues. SLP Short Term Goal 5 (Week 3): With Min A multimodal cues, pt will donn and doff PMSV correctly in 5 out of 10 opportunities. SLP Short Term Goal 6 (Week 3): Given Min A cues, pt will complete 5 repetitions with EMST 150 device set at 30 cmH20 and  5 repeitions with IMST device set at 10 cmH20.  Skilled Therapeutic Interventions:  Skilled treatment session targeted education on decannulation, stoma healing and POC for the remainder of pt's admission. All questions were answered to pt's satisfaction. Plan of care created to target improved intelligibility with functional phrases. This Clinical research associate and pt created several functional phrases related to pt to use during voice therapy. Pt left in bed with all needs within reach.      Pain    Therapy/Group: Individual Therapy  [name] 04/14/2019, 3:51 PM

## 2019-04-14 NOTE — Progress Notes (Signed)
Physical Therapy Session Note  Patient Details  Name: Andrew Bruce MRN: [id number] Date of Birth: Jan 22, 1965  Today's Date: 04/14/2019 PT Individual Time: 1431-1515 PT Individual Time Calculation (min): 44 min    Short Term Goals: Week 4:  PT Short Term Goal 1 (Week 4): STG=LTG due to ELOS.  Skilled Therapeutic Interventions/Progress Updates:    Pt supine in bed upon PT arrival, agreeable to therapy tx and denies pain at rest. Interpreter present throughout session. Pt transferred to sitting with supervision, donned darco shoes. Pt worked on endurance to ambulate >500 ft this session with RW and supervision-CGA, SpO2 96% and HR 117 bpm following activity. Pt ascended/descended 4 steps with R rail using B hands for UE support going sideways, step to pattern with CGA. Pt worked on dynamic standing balance to perform x 10 heel taps on 6 inch step with L HHA, min assist. Pt ambulated x 80 ft to the mat with RW and supervision. Pt worked on dynamic standing balance to perform ball toss activity, reaching outside BOS for ball, x 2 trials with CGA. Pt performed squats this session with UE support on his knees (reliant on UE support) x 4, x 10 for strengthening and endurance, min assist - SpO2 96% and HR 125 bpm after this activity. Pt ambulated x 150 ft back to room with RW and supervision, transferred to bed and left with needs in reach and bed alarm set.    Therapy Documentation Precautions:  Precautions Precautions: Fall, Other (comment) Precaution Comments: PEG, Darco Post Op shoes for ambulation Restrictions Weight Bearing Restrictions: No Other Position/Activity Restrictions: HOB elevated 30 degrees at all times    Therapy/Group: Individual Therapy  Cresenciano Genre, PT, DPT, CSRS 04/14/2019, 7:48 AM

## 2019-04-14 NOTE — Progress Notes (Signed)
Physical Therapy Session Note  Patient Details  Name: Andrew Bruce MRN: [id number] Date of Birth: April 02, 1965  Today's Date: 04/14/2019 PT Individual Time: 0905-1000 PT Individual Time Calculation (min): 55 min   Short Term Goals: Week 4:  PT Short Term Goal 1 (Week 4): STG=LTG due to ELOS.  Skilled Therapeutic Interventions/Progress Updates:     Patient in recliner in the room upon PT arrival. Patient alert and agreeable to PT session. Patient reported 4-5/10 L toe pain during session, RN made aware. PT provided repositioning, rest breaks, and distraction as pain interventions throughout session.  Assessed patient's sensation in B feet. Sensation absent in B great toes and distal end of R second toe over necrotic area, otherwise sensation present in all other toes and dorsal and plantar surfaces of his feet.   Therapeutic Activity: Bed Mobility: Patient performed supine to/from sit with supervision-mod I.  Transfers: Patient performed sit to/from stand x8 with supervision with and without a RW. Provided verbal cues for bringing RW back to him prior to sitting after ambulating for safety. Patient removed incontinence brief during session to change into boxers. Performed LB dressing seated for threading LEs through boxers and pants and standing to pull them up with mod I and supervision for balance/safety. PT donned B Darco post-op shoes over non-skid socks with total A due to increased abdominal pain from PEG tube with bending over.  Gait Training:  Patient ambulated ~100 feet x3 using a RW x2 trials with supervision and without an AD x1 tiral with CGA and min A x4 due to mild LOB. Ambulated with decreased B step length, decreased R foot clearance, decreased L weight shift, and mild forward trunk lean with RW. Provided verbal cues for erect posture, heel weight bearing, increased L weight shift with tactile cues, and increased R step height.  Neuromuscular Re-ed: Patient performed the  following dynamic standing balance activities with CGA-supervision focusing on functional tasks related to work requirements: -standing reaching for visual target outside of BOS, focused on R UE use >L UE due to hypometria  -standing picking up bean bags off the floor x6 -standing opening cabinets and removing objects and putting them away in the ADL kitchen -pulling out a cup from a cabinet, filling it with water and ambulating to place it at the end of the counter, patient did not spill any water while ambulating, however, did spill the water when putting items away when using his R UE due to decreased motor control  Patient in bed at end of session with breaks locked, bed alarm set, and all needs within reach. Discussed d/c planning and recreational activities patient does at home during session. Patient stated that he likes playing basketball, volleyball, soccer, and swimming. Educated patient on refraining from swimming until cleared by the MD, patient stated understanding.    Therapy Documentation Precautions:  Precautions Precautions: Fall, Other (comment) Precaution Comments: PEG Restrictions Weight Bearing Restrictions: No    Therapy/Group: Individual Therapy  Allure Greaser L Jax Kentner PT, DPT  04/14/2019, 7:13 PM

## 2019-04-14 NOTE — Progress Notes (Signed)
Social Work Patient ID: Andrew Bruce, male   DOB: Apr 03, 1965, 54 y.o.   MRN: [id number]   AMN interpreters used:  [place]ria [place]ee [id number]/IRCVE [id number]/BPZWC [id number] (connection issues)  SW met with pt, pt wife, and pt dtr in room to provide updates from team conference and change in d/c date to 04/20/2019.SW informed on recommendation for outpatient PT/OT/SLP. SW informed pt that there will be updates once there are discharge recommendations.  Loralee Pacas, MSW, [place] Office: [phone] Cell: [phone] Fax: [phone]

## 2019-04-14 NOTE — Progress Notes (Signed)
PHYSICAL MEDICINE & REHABILITATION PROGRESS NOTE   Subjective/Complaints:  Doing very well- still an occ cough- says toes feel the same- a little bit of pain.  Hasn't seen vascular  ROS:   Pt denies SOB, abd pain, CP, N/V/C/D, and vision changes   Objective:   No results found. Recent Labs    04/12/19 0704  WBC 7.5  HGB 12.1*  HCT 38.7*  PLT 503*   Recent Labs    04/12/19 0704  NA 140  K 3.3*  CL 102  CO2 26  GLUCOSE 82  BUN 14  CREATININE 0.40*  CALCIUM 9.3    Intake/Output Summary (Last 24 hours) at 04/14/2019 0920 Last data filed at 04/14/2019 0700 Gross per 24 hour  Intake 580 ml  Output 400 ml  Net 180 ml     Physical Exam: Vital Signs Blood pressure (!) 137/96, pulse 91, temperature 98.7 F (37.1 C), temperature source Oral, resp. rate 18, height 5\' 6"  (1.676 m), weight 69.2 kg, SpO2 98 %.  Gen: awake, laying supine in bed; using interpretor to talk, NAD CV: borderline tachycardia- regular rhythm Pulm: CTA B/L- good iair movement GI- soft, NT, ND (+)BS Skin: dry gangrene B/L feet- L 1st toe- doesn't look great; better on other toes Psych:calm Musc: No edema in extremities.  No tenderness in extremities. Neuro: Ox3 Motor: Bilateral upper extremities: 4/5 proximal distal Bilateral lower extremities: 4 --4/5 proximal to distal  Assessment/Plan: 1. 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  Physiatrist is providing close team supervision and 24 hour management of active medical problems listed below.  Physiatrist and rehab team continue to assess barriers to discharge/monitor patient progress toward functional and medical goals  Care Tool:  Bathing    Body parts bathed by patient: Right arm, Left arm, Chest, Abdomen, Face, Front perineal area, Buttocks, Right upper leg, Left upper leg,  Right lower leg, Left lower leg   Body parts bathed by helper: Front perineal area, Buttocks, Right upper leg, Left upper leg, Right lower leg, Left lower leg     Bathing assist Assist Level: Minimal Assistance - Patient > 75%     Upper Body Dressing/Undressing Upper body dressing   What is the patient wearing?: Pull over shirt    Upper body assist Assist Level: Supervision/Verbal cueing    Lower Body Dressing/Undressing Lower body dressing      What is the patient wearing?: Pants     Lower body assist Assist for lower body dressing: Minimal Assistance - Patient > 75%     Toileting Toileting Toileting Activity did not occur (Clothing management and hygiene only): N/A (no void or bm)(foley)  Toileting assist Assist for toileting: Supervision/Verbal cueing     Transfers Chair/bed transfer  Transfers assist  Chair/bed transfer activity did not occur: Safety/medical concerns(limited by dizziness, chest pain)  Chair/bed transfer assist level: Supervision/Verbal cueing Chair/bed transfer assistive device: Programmer, multimedia   Ambulation assist   Ambulation activity did not occur: Safety/medical concerns(limited by dizziness, chest pain)  Assist level: Supervision/Verbal cueing Assistive device: Walker-rolling Max distance: 150 ft   Walk 10 feet activity   Assist  Walk 10 feet activity did not occur: Safety/medical concerns(limited by dizziness, chest pain)  Assist level: Supervision/Verbal cueing Assistive device: Walker-rolling   Walk 50 feet activity   Assist Walk 50 feet with 2 turns activity did not  occur: Safety/medical concerns(limited by dizziness, chest pain)  Assist level: Supervision/Verbal cueing Assistive device: Walker-rolling    Walk 150 feet activity   Assist Walk 150 feet activity did not occur: Safety/medical concerns  Assist level: Supervision/Verbal cueing Assistive device: Walker-rolling    Walk 10 feet on uneven  surface  activity   Assist Walk 10 feet on uneven surfaces activity did not occur: Safety/medical concerns(limited by dizziness, chest pain)         Wheelchair     Assist     Wheelchair activity did not occur: Safety/medical concerns(limited by dizziness, chest pain)         Wheelchair 50 feet with 2 turns activity    Assist    Wheelchair 50 feet with 2 turns activity did not occur: Safety/medical concerns(limited by dizziness, chest pain)       Wheelchair 150 feet activity     Assist  Wheelchair 150 feet activity did not occur: Safety/medical concerns(limited by dizziness, chest pain)       Assessment/plan: 1.  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   Continue CIR  2.  Antithrombotics: -DVT/anticoagulation:  Pharmaceutical: Other (comment)--Eliquis             -antiplatelet therapy: N/A 3. Pain Management: Added oxycodone prn for sacral pain.   3/30- pain controlled- will switch to Norco for pain 4. Mood: LCSW to follow for evaluation and support.              -antipsychotic agents: N/A 5. Neuropsych: This patient appears to be capable of making decisions on his own behalf. 6. Sacral decubitus/Skin/Wound Care  Sacral ulcer  Continue air mattress overlay.  3/31- will reassess tomorrow 7. Fluids/Electrolytes/Nutrition: Monitor I/Os 8. T2DM with hyperglycemia: Hgb A1c- 11.7  Levemir bid with SSI   Consulted dietician to start bolus tube feeds.    CBG (last 3)  Recent Labs    04/13/19 1627 04/13/19 2020 04/14/19 0614  GLUCAP 95 152* 107*   Novolog 3 units- will monitor  Decrease Levemir to 20 units BID   Labile, but?  Stabilizing.  Patient also appears to be eating food from home  3/29- will switch to Glipizide and Metformin since has to pay out of pocket for meds  3/31- doing great- con't meds  Continue to monitor 9. Dizziness: Changed Cardura to bedtime to avoid SE. Will monitor orthostatic  Bps  Improving  10. 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   Added Duonebs QID  Plan for decannulation this week  3/30- decannulated- tolerating and doing great 12. Resting tachycardia: Due to deconditioning. No BB due to soft BP/orthostatic symptoms.  Monitor for now.    Vitals:   04/13/19 2018 04/14/19 0617  BP: 114/83 (!) 137/96  Pulse: (!) 106 91  Resp: 16 18  Temp: 98.7 F (37.1 C) 98.7 F (37.1 C)  SpO2: 93% 98%   Labile up to 145/99- but overall controlled 3/30 13. Gangrenous changes bilateral feet: off loading shoes.  3/29- will call Vascular to see toes- hasn't been seen by vascular yet.   Continue to monitor.  14. Abnormal LFTs:   3/10- mild elevation of ALT only- will con't to monitor 15. ABLA: Due to critical illness.   Hemoglobin 11.4 on 3/22, labs ordered for tomorrow 16. Right Vocal fold immobility, dysphonia:s/p transnasal fiberoptic laryngoscopy by ENT on 3/9 but was aborted due to gagging and vomiting.   3/23- injected vocal  folds with Prolaryn R>L and kenalog on R due to being tethetered 17.  Labile blood pressure  Monitor for trend  3/31- controlled overall 18. PEG  3/30- placed 2/24- so have to wait to pull  3/31- d/w pt  LOS: 22 days A FACE TO FACE EVALUATION WAS PERFORMED  Andrew Bruce 04/14/2019, 9:20 AM

## 2019-04-14 NOTE — Progress Notes (Signed)
Nutrition Follow-up  DOCUMENTATION CODES:   Not applicable  INTERVENTION:   D/c Glucerna Shake po TID, each supplement provides 220 kcal and 10 grams of protein (does not provide enough calories/protein)  D/c bolus of Osmolite 1.5 cal daily  Ensure Enlive po TID, each supplement provides 350 kcal and 20 grams of protein  Continue Juven BID  Continue Magic cup TID with meals, each supplement provides 290 kcal and 9 grams of protein    NUTRITION DIAGNOSIS:   Increased nutrient needs related to wound healing as evidenced by estimated needs.  Ongoing.  GOAL:   Patient will meet greater than or equal to 90% of their needs  Progressing.  MONITOR:   PO intake, Labs, Weight trends, Skin, Supplement acceptance  REASON FOR ASSESSMENT:   Consult Enteral/tube feeding initiation and management  ASSESSMENT:   Pt was originally admitted on 02/06/19 after 10 day history of Covid 19 and progressive shortness of breath x 48  Hours. Pt admitted to CIR on 03/23/19.  1/23 Admit 1/24 Intubated 1/25 B/L chest tubes placed 2/02 CT head: multiple ischemic infarcts  2/16 S/P tracheostomy and Cortrak placement (tip in the stomach) 2/24 S/P PEG 2/25 TF being resumed 3/24 DYS3/Nectar thick ordered 3/26 DYS3/Thins ordered  3/30 Pt decannulated  Discussed pt with RN.   PO Intake: 15-100% x last 7 recorded meals (56% average intake)  Noted that pt was switched from Ensure Enlive to Glucerna TID by PA. Ensure Enlive provides more calories and protein than Glucerna. RD will transition pt back to Ensure Enlive TID given pt with poor PO intake.   Medications reviewed and include: Vitamin C, Pepcid, free water QID, Glucotrol, Novolog, Niferex, Glucophage, MVI, Juven BID, Senokot-S, Zinc sulfate, Osmolite 1.5 cal daily  Labs reviewed: K+ 3.3 (L) CBGs 92-152  UOP: x24 hours I/O: -17,813ml since admit  Admit wt: 69.8kg Current wt: 69.2 kg  Diet Order:   Diet Order        DIET DYS 3 Room service appropriate? Yes; Fluid consistency: Thin  Diet effective now        Diet - low sodium heart healthy              EDUCATION NEEDS:   No education needs have been identified at this time  Skin:  Skin Assessment: Skin Integrity Issues: Skin Integrity Issues:: Unstageable, Other (Comment) Unstageable: coccyx Incisions: lip Other: necrotic toes  Last BM:  3/30  Height:   Ht Readings from Last 1 Encounters:  03/24/19 5\' 6"  (1.676 m)    Weight:   Wt Readings from Last 1 Encounters:  04/14/19 69.2 kg    BMI:  Body mass index is 24.63 kg/m.  Estimated Nutritional Needs:   Kcal:  2300-2600  Protein:  120-145 grams  Fluid:  >/=2L/d   04/16/19, MS, RD, LDN RD pager number and weekend/on-call pager number located in Amion.

## 2019-04-15 ENCOUNTER — Inpatient Hospital Stay (HOSPITAL_COMMUNITY): Payer: Self-pay | Admitting: Speech Pathology

## 2019-04-15 ENCOUNTER — Inpatient Hospital Stay (HOSPITAL_COMMUNITY): Payer: Self-pay | Admitting: Occupational Therapy

## 2019-04-15 ENCOUNTER — Inpatient Hospital Stay (HOSPITAL_COMMUNITY): Payer: Self-pay

## 2019-04-15 DIAGNOSIS — Z8616 Personal history of COVID-19: Secondary | ICD-10-CM

## 2019-04-15 DIAGNOSIS — I96 Gangrene, not elsewhere classified: Secondary | ICD-10-CM

## 2019-04-15 LAB — CBC WITH DIFFERENTIAL/PLATELET
Abs Immature Granulocytes: 0.04 10*3/uL (ref 0.00–0.07)
Basophils Absolute: 0.1 10*3/uL (ref 0.0–0.1)
Basophils Relative: 1 %
Eosinophils Absolute: 0.2 10*3/uL (ref 0.0–0.5)
Eosinophils Relative: 2 %
HCT: 38.6 % — ABNORMAL LOW (ref 39.0–52.0)
Hemoglobin: 12 g/dL — ABNORMAL LOW (ref 13.0–17.0)
Immature Granulocytes: 1 %
Lymphocytes Relative: 27 %
Lymphs Abs: 2 10*3/uL (ref 0.7–4.0)
MCH: 26.5 pg (ref 26.0–34.0)
MCHC: 31.1 g/dL (ref 30.0–36.0)
MCV: 85.2 fL (ref 80.0–100.0)
Monocytes Absolute: 0.4 10*3/uL (ref 0.1–1.0)
Monocytes Relative: 6 %
Neutro Abs: 4.7 10*3/uL (ref 1.7–7.7)
Neutrophils Relative %: 63 %
Platelets: 476 10*3/uL — ABNORMAL HIGH (ref 150–400)
RBC: 4.53 MIL/uL (ref 4.22–5.81)
RDW: 16.9 % — ABNORMAL HIGH (ref 11.5–15.5)
WBC: 7.5 10*3/uL (ref 4.0–10.5)
nRBC: 0 % (ref 0.0–0.2)

## 2019-04-15 LAB — BASIC METABOLIC PANEL
Anion gap: 11 (ref 5–15)
BUN: 10 mg/dL (ref 6–20)
CO2: 24 mmol/L (ref 22–32)
Calcium: 9.3 mg/dL (ref 8.9–10.3)
Chloride: 103 mmol/L (ref 98–111)
Creatinine, Ser: 0.44 mg/dL — ABNORMAL LOW (ref 0.61–1.24)
GFR calc Af Amer: >60 mL/min (ref >60)
GFR calc non Af Amer: >60 mL/min (ref >60)
Glucose, Bld: 109 mg/dL — ABNORMAL HIGH (ref 70–99)
Potassium: 3.9 mmol/L (ref 3.5–5.1)
Sodium: 138 mmol/L (ref 135–145)

## 2019-04-15 LAB — GLUCOSE, CAPILLARY
Glucose-Capillary: 94 mg/dL (ref 70–99)
Glucose-Capillary: 83 mg/dL (ref 70–99)
Glucose-Capillary: 144 mg/dL — ABNORMAL HIGH (ref 70–99)
Glucose-Capillary: 119 mg/dL — ABNORMAL HIGH (ref 70–99)

## 2019-04-15 NOTE — Progress Notes (Signed)
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  Discussed with the patient that I would come back today and talk with him with an interpreter after my OR cases were complete.  I discussed further with him options of surgery with an interpreter this afternoon.  I discussed that I think he would require a TMA on the left given the dry gangrene extends further back and likely toe amputations of all 5 toes on the right.  Ultimately he does not want any surgery at this time.  The patient along with therapist feel that his toes have shown some signs of improvement.  My understanding is this has been ongoing on for several months.  I discussed with him that I do not think the tissue loss is going to resolve given that he has frankly necrotic tissue although this is dry gangrene and there is no active signs of infection.  Some of these toes could auto amputate.  I will arrange follow-up in our clinic in 3 to 4 weeks for wound checks and we can further discuss toe amputations at that time versus TMA.  I discussed with interpreter that if he gets fevers chills redness foul smell drainage etc. he needs to come back immediately to be evaluated.  We will take him off the OR schedule for Monday.  I will arrange follow-up.    Cephus Shelling, MD [facility] Office: [phone]   Cephus Shelling

## 2019-04-15 NOTE — Progress Notes (Signed)
Physical Therapy Session Note  Patient Details  Name: Andrew Bruce MRN: [id number] Date of Birth: 1966/01/11  Today's Date: 04/15/2019 PT Individual Time: 1112-1215  PT Individual Time Calculation (min): 63 min  Short Term Goals: Week 4:  PT Short Term Goal 1 (Week 4): STG=LTG due to ELOS.  Skilled Therapeutic Interventions/Progress Updates:     Patient in bed upon PT arrival. Patient alert and agreeable to PT session. Patient reported 5-6/10 L foot/toe pain during session, RN made aware and provided pain medication during session. PT provided repositioning, rest breaks, and distraction as pain interventions throughout session. Interpreter present for entire session.  Patient reported that he was not doing well today and he became very tearful. He stated that a PA and MD came to look at his toes this morning and indicated that he needed to have a transmetatarsal amputation. The patient did not understand why he needed to have an amputation at this time, as he had been informed that his toes were doing well. PT comforted patient and, per RN's suggestion, requested to have Dr. Chestine Spore, MD paged to discuss amputation with the patient with the interpreter and a therapist present to assist with patient's understanding and reinforce information with the patient following the discussion in therapy. MD came toward the end of this session and clarified with the patient about his options for surgery versus waiting and explained the risks/benefits. Interpreter present for discussion. PT asked clarifying questions based on mobility outcomes and burden of care with both options. Patient also asked if the MD could speak with his daughter about this, and stated that he would like to wait at this time. PT   Prior to discussion with Dr. Chestine Spore, patient ambulated >500 feet using a RW with close supervision around the unit, stating that he wanted to ambulate as long as he can. PT educated about orthotic options to  assist with walking in the event of amputation. Patient appeared encouraged during education. Patient then shot a ball into the basket ball hoop x3-4 min x2 with CGA-close supervision. He picked up the ball from the floor and caught the ball when it was passed to him throughout. He then ambulated from the main gym back to his room using a RW with supervision.   Patient in bed at end of session with breaks locked, bed alarm set, and all needs within reach.    Therapy Documentation Precautions:  Precautions Precautions: Fall, Other (comment) Precaution Comments: PEG, Darco Post Op shoes for ambulation Restrictions Weight Bearing Restrictions: No    Therapy/Group: Individual Therapy  Taylie Helder L Demetres Prochnow PT, DPT  04/15/2019, 4:51 PM

## 2019-04-15 NOTE — Consult Note (Addendum)
Hospital Consult    Reason for Consult:  Gangrenous toes Requesting Physician:  CIR MRN #:  [id number]  History of Present Illness: This is a 54 y.o. male who was recently hospitalized and treated for COVID-19.  Complications included hypercoagulability leading to an embolic event causing CVA as well as gangrenous toes of bilateral lower extremity.  Patient is ambulating with a walker.  He denies any pain in his toes at rest.  He has a Darco shoe for both feet to offload his toes.  He denies any fevers, chills, nausea/vomiting.  Based on periodic photos entered into the chart, toes seem to be demarcating well.  He denies tobacco use.  Patient is now on Eliquis.    Past Surgical History:  Procedure Laterality Date  . DIRECT LARYNGOSCOPY Bilateral 04/06/2019   Procedure: MICRO DIRECT LARYNGOSCOPY WITH PROLARYN INJECTION;  Surgeon: Christia Reading, MD;  Location: [facility] OR;  Service: ENT;  Laterality: Bilateral;  . IR GASTROSTOMY TUBE MOD SED  03/10/2019    No Known Allergies  Prior to Admission medications   Medication Sig Start Date End Date Taking? Authorizing Provider  Amino Acids-Protein Hydrolys (FEEDING SUPPLEMENT, PRO-STAT SUGAR FREE 64,) LIQD Place 30 mLs into feeding tube 3 (three) times daily. 03/23/19   Amin, Loura Halt, MD  apixaban (ELIQUIS) 5 MG TABS tablet Place 1 tablet (5 mg total) into feeding tube 2 (two) times daily. 03/23/19   Amin, Loura Halt, MD  atorvastatin (LIPITOR) 20 MG tablet Place 1 tablet (20 mg total) into feeding tube daily at 6 PM. 03/23/19   Amin, Loura Halt, MD  doxazosin (CARDURA) 1 MG tablet Place 1 tablet (1 mg total) into feeding tube daily. 03/24/19   Amin, Loura Halt, MD  famotidine (PEPCID) 20 MG tablet Place 1 tablet (20 mg total) into feeding tube 2 (two) times daily. 03/23/19   Amin, Loura Halt, MD  folic acid (FOLVITE) 1 MG tablet Place 1 tablet (1 mg total) into feeding tube daily. 03/24/19   Amin, Ankit Chirag, MD  insulin detemir (LEVEMIR) 100  UNIT/ML injection Inject 0.3 mLs (30 Units total) into the skin 2 (two) times daily. 03/23/19   Amin, Loura Halt, MD  Multiple Vitamin (MULTIVITAMIN) LIQD Place 15 mLs into feeding tube daily. 03/24/19   Amin, Loura Halt, MD  nutrition supplement, JUVEN, (JUVEN) PACK Place 1 packet into feeding tube 2 (two) times daily between meals. 03/24/19   Amin, Loura Halt, MD  Nutritional Supplements (FEEDING SUPPLEMENT, OSMOLITE 1.5 CAL,) LIQD Place 1,000 mLs into feeding tube continuous. 03/23/19   Amin, Ankit Chirag, MD  senna-docusate (SENOKOT-S) 8.6-50 MG tablet Place 1 tablet into feeding tube at bedtime as needed for mild constipation. 03/23/19   Amin, Loura Halt, MD  thiamine 100 MG tablet Place 1 tablet (100 mg total) into feeding tube daily. 03/24/19   Dimple Nanas, MD    Social History   Socioeconomic History  . Marital status: Married    Spouse name: Not on file  . Number of children: Not on file  . Years of education: Not on file  . Highest education level: Not on file  Occupational History  . Not on file  Tobacco Use  . Smoking status: Never Smoker  . Smokeless tobacco: Never Used  Substance and Sexual Activity  . Alcohol use: Yes  . Drug use: Never  . Sexual activity: Not on file  Other Topics Concern  . Not on file  Social History Narrative  . Not on file  Social Determinants of Health   Financial Resource Strain:   . Difficulty of Paying Living Expenses:   Food Insecurity:   . Worried About Programme researcher, broadcasting/film/video in the Last Year:   . Barista in the Last Year:   Transportation Needs:   . Freight forwarder (Medical):   Marland Kitchen Lack of Transportation (Non-Medical):   Physical Activity:   . Days of Exercise per Week:   . Minutes of Exercise per Session:   Stress:   . Feeling of Stress :   Social Connections:   . Frequency of Communication with Friends and Family:   . Frequency of Social Gatherings with Friends and Family:   . Attends Religious Services:     . Active Member of Clubs or Organizations:   . Attends Banker Meetings:   Marland Kitchen Marital Status:   Intimate Partner Violence:   . Fear of Current or Ex-Partner:   . Emotionally Abused:   Marland Kitchen Physically Abused:   . Sexually Abused:      Family History  Problem Relation Age of Onset  . Healthy Sister   . Healthy Brother     ROS: Otherwise negative unless mentioned in HPI  Physical Examination  Vitals:   04/15/19 0528 04/15/19 0603  BP: 113/84 (!) 147/95  Pulse: (!) 112 91  Resp: 19   Temp:    SpO2: 95% 98%   Body mass index is 23.57 kg/m.  General:  WDWN in NAD Gait: Not observed HENT: WNL, normocephalic Pulmonary: normal non-labored breathing, without Rales, rhonchi,  wheezing Cardiac: regular Abdomen:  soft, NT/ND, no masses Skin: without rashes Vascular Exam/Pulses: Symmetrical 2+ DP and PT pulses Extremities: Dry gangrenous toes without areas of purulence, drainage, or erythema Musculoskeletal: no muscle wasting or atrophy  Neurologic: A&O X 3;  No focal weakness or paresthesias are detected; speech is fluent/normal Psychiatric:  The pt has Normal affect. Lymph:  Unremarkable   Photo credit: Dr. Berline Chough   CBC    Component Value Date/Time   WBC 7.5 04/15/2019 0620   RBC 4.53 04/15/2019 0620   HGB 12.0 (L) 04/15/2019 0620   HCT 38.6 (L) 04/15/2019 0620   PLT 476 (H) 04/15/2019 0620   MCV 85.2 04/15/2019 0620   MCH 26.5 04/15/2019 0620   MCHC 31.1 04/15/2019 0620   RDW 16.9 (H) 04/15/2019 0620   LYMPHSABS 2.0 04/15/2019 0620   MONOABS 0.4 04/15/2019 0620   EOSABS 0.2 04/15/2019 0620   BASOSABS 0.1 04/15/2019 0620    BMET    Component Value Date/Time   NA 138 04/15/2019 0620   K 3.9 04/15/2019 0620   CL 103 04/15/2019 0620   CO2 24 04/15/2019 0620   GLUCOSE 109 (H) 04/15/2019 0620   BUN 10 04/15/2019 0620   CREATININE 0.44 (L) 04/15/2019 0620   CALCIUM 9.3 04/15/2019 0620   GFRNONAA >60 04/15/2019 0620   GFRAA >60 04/15/2019 0620     COAGS: Lab Results  Component Value Date   INR 1.1 03/02/2019   INR 1.1 02/26/2019      ASSESSMENT/PLAN: This is a 54 y.o. male with dry gangrenous toes of bilateral lower extremities  Feet are well-perfused with symmetrical 2+ DP and PT pulses No indication for further work-up of circulation Heel weight-bear only with Darco shoes No indication for toe amputation currently given it is dry gangrene without any sign of infection and without any pain Vascular will follow periodically while patient is in rehab and will continue to  follow outpatient in case toe amputation is needed   Dagoberto Ligas PA-C [facility] [phone]  I have seen and evaluated the patient. I agree with the PA note as documented above.  54 year old male the vascular surgery was consulted for gangrenous changes to bilateral lower extremities.  Has had a prolonged hospitalization in setting of Covid.  He has bounding dorsalis pedis and posterior tibial pulses bilaterally and I think this is all pressor induced changes from his earlier hospital course.  He told me that he was having pain in the left foot and this has been ongoing for several months.  The toes are fairly well demarcated and certainly no signs of infection that would make for a emergent or urgent intervention.  I have offered him right toe amputations and likely a left TMA as early as Monday (given demarcation extends more proximally on the left).  We need to hold his Eliquis through the weekend okay to transition to heparin drip that we can hold on call to the OR.  Certainly if he changes his mind there would be no urgent indication for amputation we can always follow-up as an outpatient if he can be discharged sooner.  Marty Heck, MD [facility] [place] Office: [phone]

## 2019-04-15 NOTE — Progress Notes (Signed)
[place] PHYSICAL MEDICINE & REHABILITATION PROGRESS NOTE   Subjective/Complaints:  Vascular came this AM- per note, has demarcation of dry gangrene- will have him f/u with Vascular to make sure doesn't need amputation- not now since pain controlled and no signs of infection.  Pt reports doing well - trach site healing per pt and pain controlled on toes with meds.   ROS:  Pt denies SOB, abd pain, CP, N/V/C/D, and vision changes   Objective:   No results found. Recent Labs    04/15/19 0620  WBC 7.5  HGB 12.0*  HCT 38.6*  PLT 476*   Recent Labs    04/15/19 0620  NA 138  K 3.9  CL 103  CO2 24  GLUCOSE 109*  BUN 10  CREATININE 0.44*  CALCIUM 9.3    Intake/Output Summary (Last 24 hours) at 04/15/2019 0858 Last data filed at 04/15/2019 0550 Gross per 24 hour  Intake 120 ml  Output 601 ml  Net -481 ml     Physical Exam: Vital Signs Blood pressure (!) 147/95, pulse 91, temperature 97.7 F (36.5 C), temperature source Oral, resp. rate 19, height 5\' 6"  (1.676 m), weight 66.2 kg, SpO2 98 %.  Gen: awake, supine in bed, used interpretor to communicate, NAD CV: RRR- no JVD Pulm: CTA B/L- no W/R/R- good air movement GI- Soft, NT, ND, (+)BS  Skin: dry gangrene B/L toes- L 1st toe looks OK, but other toes better Psych: appropriate Musc: No edema in extremities.  No tenderness in extremities. Neuro: Ox3 Motor: Bilateral upper extremities: 4/5 proximal distal Bilateral lower extremities: 4 --4/5 proximal to distal  Assessment/Plan: 1. 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  Physiatrist is providing close team supervision and 24 hour management of active medical problems listed below.  Physiatrist and rehab team continue to assess barriers to discharge/monitor patient progress toward functional and medical goals  Care  Tool:  Bathing    Body parts bathed by patient: Right arm, Left arm, Chest, Abdomen, Face, Front perineal area, Buttocks, Right upper leg, Left upper leg, Right lower leg, Left lower leg   Body parts bathed by helper: Front perineal area, Buttocks, Right upper leg, Left upper leg, Right lower leg, Left lower leg     Bathing assist Assist Level: Minimal Assistance - Patient > 75%     Upper Body Dressing/Undressing Upper body dressing   What is the patient wearing?: Pull over shirt    Upper body assist Assist Level: Supervision/Verbal cueing    Lower Body Dressing/Undressing Lower body dressing      What is the patient wearing?: Pants     Lower body assist Assist for lower body dressing: Minimal Assistance - Patient > 75%     Toileting Toileting Toileting Activity did not occur (Clothing management and hygiene only): N/A (no void or bm)(foley)  Toileting assist Assist for toileting: Supervision/Verbal cueing     Transfers Chair/bed transfer  Transfers assist  Chair/bed transfer activity did not occur: Safety/medical concerns(limited by dizziness, chest pain)  Chair/bed transfer assist level: Supervision/Verbal cueing Chair/bed transfer assistive device: Programmer, multimedia   Ambulation assist   Ambulation activity did not occur: Safety/medical concerns(limited by dizziness, chest pain)  Assist level: Supervision/Verbal cueing Assistive device: Walker-rolling Max distance: 100   Walk 10 feet activity   Assist  Walk 10 feet activity did not occur: Safety/medical concerns(limited  by dizziness, chest pain)  Assist level: Supervision/Verbal cueing Assistive device: Walker-rolling   Walk 50 feet activity   Assist Walk 50 feet with 2 turns activity did not occur: Safety/medical concerns(limited by dizziness, chest pain)  Assist level: Supervision/Verbal cueing Assistive device: Walker-rolling    Walk 150 feet activity   Assist Walk 150  feet activity did not occur: Safety/medical concerns  Assist level: Contact Guard/Touching assist Assistive device: Walker-rolling    Walk 10 feet on uneven surface  activity   Assist Walk 10 feet on uneven surfaces activity did not occur: Safety/medical concerns(limited by dizziness, chest pain)         Wheelchair     Assist     Wheelchair activity did not occur: Safety/medical concerns(limited by dizziness, chest pain)         Wheelchair 50 feet with 2 turns activity    Assist    Wheelchair 50 feet with 2 turns activity did not occur: Safety/medical concerns(limited by dizziness, chest pain)       Wheelchair 150 feet activity     Assist  Wheelchair 150 feet activity did not occur: Safety/medical concerns(limited by dizziness, chest pain)       Assessment/plan: 1.  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   Continue CIR  2.  Antithrombotics: -DVT/anticoagulation:  Pharmaceutical: Other (comment)--Eliquis             -antiplatelet therapy: N/A 3. Pain Management: Added oxycodone prn for sacral pain.   4/1- tolerating Norco- doing well- con't meds 4. Mood: LCSW to follow for evaluation and support.              -antipsychotic agents: N/A 5. Neuropsych: This patient appears to be capable of making decisions on his own behalf. 6. Sacral decubitus/Skin/Wound Care  Sacral ulcer  Continue air mattress overlay.  3/31- will reassess tomorrow 7. Fluids/Electrolytes/Nutrition: Monitor I/Os 8. T2DM with hyperglycemia: Hgb A1c- 11.7  Levemir bid with SSI   Consulted dietician to start bolus tube feeds.    CBG (last 3)  Recent Labs    04/14/19 1633 04/14/19 2126 04/15/19 0635  GLUCAP 156* 119* 94   Novolog 3 units- will monitor  Decrease Levemir to 20 units BID   Labile, but?  Stabilizing.  Patient also appears to be eating food from home  3/29- will switch to Glipizide and Metformin since has to pay  out of pocket for meds  4/1- doing great with BGs- con't meds  Continue to monitor 9. Dizziness: Changed Cardura to bedtime to avoid SE. Will monitor orthostatic Bps  Improving  10. 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   Added Duonebs QID  3/30- decannulated- tolerating and doing great  4/1- trach site looks good- healing 12. Resting tachycardia: Due to deconditioning. No BB due to soft BP/orthostatic symptoms.  Monitor for now.    Vitals:   04/15/19 0528 04/15/19 0603  BP: 113/84 (!) 147/95  Pulse: (!) 112 91  Resp: 19   Temp:    SpO2: 95% 98%   Labile up to 145/99- but overall controlled 3/30 13. Gangrenous changes bilateral feet: off loading shoes.  3/29- will call Vascular to see toes- hasn't been seen by vascular yet.   Continue to monitor.  14. Abnormal LFTs:   3/10- mild elevation of ALT only- will con't to monitor 15. ABLA: Due to critical illness.   4/1- Hb up to 12k-con't regimen 16. Right  Vocal fold immobility, dysphonia:s/p transnasal fiberoptic laryngoscopy by ENT on 3/9 but was aborted due to gagging and vomiting.   3/23- injected vocal folds with Prolaryn R>L and kenalog on R due to being tethetered  4/1- talking well- can understand and on D3 thins now 17.  Labile blood pressure  Monitor for trend  3/31- controlled overall 18. PEG  3/30- placed 2/24- so have to wait to pull  3/31- d/w pt  4/1- needs to be pulled 4/7- d/c 4/6- will check with GI if can pull on d/c. Will have IR pull it.   LOS: 23 days A FACE TO FACE EVALUATION WAS PERFORMED  Ruthanne Mcneish 04/15/2019, 8:58 AM

## 2019-04-15 NOTE — Progress Notes (Signed)
Occupational Therapy Weekly Progress Note  Patient Details  Name: Keylan Costabile MRN: [id number] Date of Birth: 1965/07/23  Beginning of progress report period: April 08, 2019 End of progress report period: April 15, 2019  Today's Date: 04/15/2019 OT Individual Time: 0900-1000 OT Individual Time Calculation (min): 60 min    Patient has met 2 of 3 short term goals.  Improving balance, ability to cross legs for LB dressing noted this week.    Patient continues to demonstrate the following deficits: muscle weakness, decreased cardiorespiratoy endurance and decreased standing balance and hemiplegia and therefore will continue to benefit from skilled OT intervention to enhance overall performance with BADL, iADL and Reduce care partner burden.  Patient progressing toward long term goals..  Continue plan of care.  OT Short Term Goals Week 3:  OT Short Term Goal 1 (Week 3): patient will complete bathing and dressing CS/set up seated OT Short Term Goal 1 - Progress (Week 3): Met OT Short Term Goal 2 (Week 3): patient will complete functional transfers and ambulation with LRAD CS OT Short Term Goal 2 - Progress (Week 3): Met OT Short Term Goal 3 (Week 3): patient will improve standing tolerance to 10 minutes with light activity OT Short Term Goal 3 - Progress (Week 3): Progressing toward goal Week 4:  OT Short Term Goal 1 (Week 4): Patient will increase standing tolerance for funcitonal activity to 10 minutes OT Short Term Goal 2 (Week 4): patient will complete HEP mod I  Skilled Therapeutic Interventions/Progress Updates:    Patient seated in recliner, notes mild pain in right foot/toes.  He is happy to participate in therapy session although he states that he is tired.  Functional transfers to/from shower seat, bed, arm chair, commode, recliner and ambulation in room with RW CS t/o session.  Shower completed with set up, UB and LB undressing/dressing completed with CS/set up (he is able to  remove post OP shoes, occ min A to fasten velcro due to structure of shoes) Completed UB dexterity and [place] activities with good endurance and focus on right side motor control/ROM.  He returned to bed at close of session - call bell in reach.    Therapy Documentation Precautions:  Precautions Precautions: Fall, Other (comment) Precaution Comments: PEG, Darco Post Op shoes for ambulation Restrictions Weight Bearing Restrictions: No Other Position/Activity Restrictions: HOB elevated 30 degrees at all times   Other Treatments:     Therapy/Group: Individual Therapy  Carlos Levering 04/15/2019, 12:22 PM

## 2019-04-15 NOTE — Progress Notes (Signed)
Patient ortho vitals this morning at 5:26 AM MEWs score was yellow d/t HR in sitting position was 101 and in standing position was 112. Writer re-check patient BP and HR at 6:03 AM, BP 147/95 HR 91 at R arm laying position. MEWs score is in green, patient is not in distress. Lajoyce Corners Danely Bayliss, LPN

## 2019-04-15 NOTE — Progress Notes (Signed)
Speech Language Pathology Weekly Progress and Session Note  Patient Details  Name: Andrew Bruce MRN: [id number] Date of Birth: 10/15/1965  Beginning of progress report period: April 08, 2019 End of progress report period: April 15, 2019  Today's Date: 04/15/2019 SLP Individual Time: 1300-1400 SLP Individual Time Calculation (min): 60 min  Short Term Goals: Week 3: SLP Short Term Goal 1 (Week 3): Pt will consume current diet with minimal overt s/s of dysphagia or aspiration and supervision cues for use of compensatory strategies. SLP Short Term Goal 1 - Progress (Week 3): Met SLP Short Term Goal 2 (Week 3): Pt will consume trials of thin liquids with minimal overt s/s of aspiration and no decline in respiratory status to demonstrate readiness for repeat instrumental study. SLP Short Term Goal 2 - Progress (Week 3): Met SLP Short Term Goal 3 (Week 3): Pt will phonate to achieve ~ 75% intelligibility at the phrase level with Min A cues. SLP Short Term Goal 3 - Progress (Week 3): Met SLP Short Term Goal 4 (Week 3): Pt will complete 3 sets of 10 pharyngeal strengthening exercises (effortful swallow) with supervision cues. SLP Short Term Goal 4 - Progress (Week 3): Progressing toward goal SLP Short Term Goal 5 (Week 3): With Min A multimodal cues, pt will donn and doff PMSV correctly in 5 out of 10 opportunities. SLP Short Term Goal 5 - Progress (Week 3): Discontinued (comment)(pt decannulated) SLP Short Term Goal 6 (Week 3): Given Min A cues, pt will complete 5 repetitions with EMST 150 device set at 30 cmH20 and  5 repeitions with IMST device set at 10 cmH20. SLP Short Term Goal 6 - Progress (Week 3): Progressing toward goal    New Short Term Goals: Week 4: SLP Short Term Goal 1 (Week 4): With Min a cues, pt will complete pharyngeal exercises. SLP Short Term Goal 2 (Week 4): Pt will utilize speech intelligibility strategies (specifically increased vocal intensity) to increase seepch  intelligibility >90% at the simple sentence level. SLP Short Term Goal 3 (Week 4): Given Min A cues, pt will complete 5 repetitions with EMST 150 device set at 30 cmH20 and  5 repeitions with IMST device set at 10 cmH20.  Weekly Progress Updates: Pt has made remarkable progress over this reporting period. As a result, he was advanced to thin liquids and he was decannulated. His current needs are increasing respiratory support and utilizing more vocal intensity. Skilled ST continues to be required to target these deficits in order to increase pt's functional independence and reduce caregiver burden.      Intensity: Minumum of 1-2 x/day, 30 to 90 minutes Frequency: 3 to 5 out of 7 days Duration/Length of Stay: (P) 4/6 Treatment/Interventions: Patient/family education;Dysphagia/aspiration precaution training;Therapeutic Activities   Daily Session  Skilled Therapeutic Interventions:  Skilled treatment session targeted pt's communication goals and education. With intermittent Min A cues were provided for pt to increase vocal intensity at the sentence level to achieve > 95% intelligibility when asking questions regarding vascular surgeons recent visit. Pt with apporpirate questions and education provided from MD's note. Continue per current plan of care.      General    Pain Pain Assessment Pain Score: 4   Therapy/Group: Individual Therapy  Aela Bohan 04/15/2019, 4:07 PM

## 2019-04-16 ENCOUNTER — Inpatient Hospital Stay (HOSPITAL_COMMUNITY): Payer: Self-pay | Admitting: Occupational Therapy

## 2019-04-16 ENCOUNTER — Inpatient Hospital Stay (HOSPITAL_COMMUNITY): Payer: Self-pay | Admitting: Physical Therapy

## 2019-04-16 ENCOUNTER — Inpatient Hospital Stay (HOSPITAL_COMMUNITY): Payer: Self-pay | Admitting: Speech Pathology

## 2019-04-16 LAB — GLUCOSE, CAPILLARY
Glucose-Capillary: 111 mg/dL — ABNORMAL HIGH (ref 70–99)
Glucose-Capillary: 89 mg/dL (ref 70–99)
Glucose-Capillary: 86 mg/dL (ref 70–99)
Glucose-Capillary: 135 mg/dL — ABNORMAL HIGH (ref 70–99)

## 2019-04-16 MED ORDER — METOPROLOL TARTRATE 12.5 MG HALF TABLET
12.5000 mg | ORAL_TABLET | Freq: Two times a day (BID) | ORAL | Status: DC
  Administered 2019-04-16 – 2019-04-20 (×9): 12.5 mg via ORAL
  Filled 2019-04-16 (×9): qty 1

## 2019-04-16 MED ORDER — AMLODIPINE BESYLATE 2.5 MG PO TABS: 2.5000 mg | ORAL_TABLET | Freq: Every day | ORAL | Status: DC

## 2019-04-16 NOTE — Progress Notes (Signed)
Social Work Patient ID: Andrew Bruce, male   DOB: 11-27-1965, 54 y.o.   MRN: [id number]    SW met with pt in room to provide updates on d/c recommendations: DME- RW and OPPT/OT/ST. Administrator, sports from [employer] present. SW informed RW ordered, and OPT referral sent to [facility] since he requires all therapies, and his dtr [name] was listed as primary contact to schedule appointment. No further questions/concerns reported.  *Pt is a MATCH Rx patient. Referral entered in system.  Loralee Pacas, MSW, [place] Office: [phone] Cell: [phone] Fax: [phone]

## 2019-04-16 NOTE — Progress Notes (Signed)
Physical Therapy Session Note  Patient Details  Name: Andrew Bruce MRN: [id number] Date of Birth: 04-Nov-1965  Today's Date: 04/16/2019 PT Individual Time: 1105-1200 PT Individual Time Calculation (min): 55 min   Short Term Goals: Week 4:  PT Short Term Goal 1 (Week 4): STG=LTG due to ELOS.  Skilled Therapeutic Interventions/Progress Updates:    Patient received up in chair, pleasant and willing to participate in session today. Able to consistently perform functional transfers with S and RW, gait distances of approximately 138f with RW and S today. Focused part of session on balance training including tandem stance with unilateral and cross-midline reaching to throw horseshoes while in semi-tandem stance on foam pad, and catching ball/shooting ball into basketball hoop while in narrow and semi-tandem stances with MinA for balance at most. Otherwise worked on improving functional activity tolerance with multiple rounds of gait [id number] RW and S with rest breaks in between each gait distance- able to perform 4 consecutive laps with a significant rest break between each lap. Left up in the chair with all needs met this morning.   Therapy Documentation Precautions:  Precautions Precautions: Fall, Other (comment) Precaution Comments: PEG, Darco Post Op shoes for ambulation Restrictions Weight Bearing Restrictions: No Other Position/Activity Restrictions: HOB elevated 30 degrees at all times Pain: Pain Assessment Pain Scale: 0-10 Pain Score: 0-No pain Faces Pain Scale: No hurt    Therapy/Group: Individual Therapy   KrWindell NorfolkDPT, PN1   Supplemental Physical Therapist [place]  Pager [phone] Office [phone]  04/16/2019, 12:20 PM

## 2019-04-16 NOTE — Progress Notes (Addendum)
[facility] PHYSICAL MEDICINE & REHABILITATION PROGRESS NOTE   Subjective/Complaints:  Pt had questions about possible autoamputation- doesn't want surgeon to amputate toes.   Explained I was most concerned about L great toe at this point.    ROS:   Pt denies SOB, abd pain, CP, N/V/C/D, and vision changes  Objective:   No results found. Recent Labs    04/15/19 0620  WBC 7.5  HGB 12.0*  HCT 38.6*  PLT 476*   Recent Labs    04/15/19 0620  NA 138  K 3.9  CL 103  CO2 24  GLUCOSE 109*  BUN 10  CREATININE 0.44*  CALCIUM 9.3    Intake/Output Summary (Last 24 hours) at 04/16/2019 1017 Last data filed at 04/16/2019 0900 Gross per 24 hour  Intake 240 ml  Output --  Net 240 ml     Physical Exam: Vital Signs Blood pressure (!) 156/133, pulse 100, temperature 97.8 F (36.6 C), temperature source Oral, resp. rate 18, height 5\' 6"  (1.676 m), weight 68.4 kg, SpO2 96 %.  Gen: awake, supine in bed; appropriate, NAD CV: RRR Pulm: CTA B/L GI- soft, NT, ND, (+)BS  Skin: demarcation noted even more of 1st L great toe- less on other toes.  Psych: appropriate Musc: No edema in extremities.  No tenderness in extremities. Neuro: Ox3 Motor: Bilateral upper extremities: 4/5 proximal distal Bilateral lower extremities: 4 --4/5 proximal to distal  Assessment/Plan: 1. 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  Physiatrist is providing close team supervision and 24 hour management of active medical problems listed below.  Physiatrist and rehab team continue to assess barriers to discharge/monitor patient progress toward functional and medical goals  Care Tool:  Bathing    Body parts bathed by patient: Right arm, Left arm, Chest, Abdomen, Face, Front perineal area, Buttocks, Right upper leg, Left upper leg, Right lower leg, Left lower  leg   Body parts bathed by helper: Front perineal area, Buttocks, Right upper leg, Left upper leg, Right lower leg, Left lower leg     Bathing assist Assist Level: Minimal Assistance - Patient > 75%     Upper Body Dressing/Undressing Upper body dressing   What is the patient wearing?: Pull over shirt    Upper body assist Assist Level: Supervision/Verbal cueing    Lower Body Dressing/Undressing Lower body dressing      What is the patient wearing?: Pants     Lower body assist Assist for lower body dressing: Minimal Assistance - Patient > 75%     Toileting Toileting Toileting Activity did not occur (Clothing management and hygiene only): N/A (no void or bm)(foley)  Toileting assist Assist for toileting: Supervision/Verbal cueing     Transfers Chair/bed transfer  Transfers assist  Chair/bed transfer activity did not occur: Safety/medical concerns(limited by dizziness, chest pain)  Chair/bed transfer assist level: Supervision/Verbal cueing Chair/bed transfer assistive device:   Ambulation assist   Ambulation activity did not occur: Safety/medical concerns(limited by dizziness, chest pain)  Assist level: Supervision/Verbal cueing Assistive device: Walker-rolling Max distance: 100   Walk 10 feet activity   Assist  Walk 10 feet activity did not occur: Safety/medical concerns(limited by dizziness, chest pain)  Assist level: Supervision/Verbal cueing Assistive device: Walker-rolling   Walk 50 feet activity   Assist Walk 50 feet with 2 turns activity did not occur: Safety/medical concerns(limited by  dizziness, chest pain)  Assist level: Supervision/Verbal cueing Assistive device: Walker-rolling    Walk 150 feet activity   Assist Walk 150 feet activity did not occur: Safety/medical concerns  Assist level: Contact Guard/Touching assist Assistive device: Walker-rolling    Walk 10 feet on uneven surface   activity   Assist Walk 10 feet on uneven surfaces activity did not occur: Safety/medical concerns(limited by dizziness, chest pain)         Wheelchair     Assist     Wheelchair activity did not occur: Safety/medical concerns(limited by dizziness, chest pain)         Wheelchair 50 feet with 2 turns activity    Assist    Wheelchair 50 feet with 2 turns activity did not occur: Safety/medical concerns(limited by dizziness, chest pain)       Wheelchair 150 feet activity     Assist  Wheelchair 150 feet activity did not occur: Safety/medical concerns(limited by dizziness, chest pain)       Assessment/plan: 1.  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   Continue CIR  2.  Antithrombotics: -DVT/anticoagulation:  Pharmaceutical: Other (comment)--Eliquis             -antiplatelet therapy: N/A 3. Pain Management: Added oxycodone prn for sacral pain.   4/2- tolerating Norco- con't meds- explained will need to con't with  4. Mood: LCSW to follow for evaluation and support.              -antipsychotic agents: N/A 5. Neuropsych: This patient appears to be capable of making decisions on his own behalf. 6. Sacral decubitus/Skin/Wound Care  Sacral ulcer  Continue air mattress overlay.  3/31- will reassess tomorrow 7. Fluids/Electrolytes/Nutrition: Monitor I/Os 8. T2DM with hyperglycemia: Hgb A1c- 11.7  Levemir bid with SSI   Consulted dietician to start bolus tube feeds.    CBG (last 3)  Recent Labs    04/15/19 1655 04/15/19 2103 04/16/19 0615  GLUCAP 144* 119* 111*   Novolog 3 units- will monitor  Decrease Levemir to 20 units BID   Labile, but?  Stabilizing.  Patient also appears to be eating food from home  3/29- will switch to Glipizide and Metformin since has to pay out of pocket for meds  4/1- doing great with BGs- con't meds  4/2- BGs great- con't meds  Continue to monitor 9. Dizziness: Changed Cardura to  bedtime to avoid SE. Will monitor orthostatic Bps  Improving  10. 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   Added Duonebs QID  3/30- decannulated- tolerating and doing great  4/2- healing well 12. Resting tachycardia: Due to deconditioning. No BB due to soft BP/orthostatic symptoms.  Monitor for now.    Vitals:   04/16/19 0546 04/16/19 0548  BP:    Pulse: (!) 113 100  Resp:    Temp:    SpO2: 96%    Labile up to 145/99- but overall controlled 3/30  4/2- BP 156/133- will add metoprolol 12.5 mg BID to help with elevated Bps- monitor closely 13. Gangrenous changes bilateral feet: off loading shoes.  3/29- will call Vascular to see toes- hasn't been seen by vascular yet.   4/2- pt refused amputaiton- discussed my concerns and what it means to "autoamputate"- via interpretor- pt felt like he understands.   Continue to monitor.  14. Abnormal LFTs:   3/10- mild elevation of ALT only- will con't to monitor 15. ABLA: Due to  critical illness.   4/1- Hb up to 12k-con't regimen 16. Right Vocal fold immobility, dysphonia:s/p transnasal fiberoptic laryngoscopy by ENT on 3/9 but was aborted due to gagging and vomiting.   3/23- injected vocal folds with Prolaryn R>L and kenalog on R due to being tethetered  4/1- talking well- can understand and on D3 thins now 17.  Labile blood pressure  Monitor for trend  3/31- controlled overall  4/2- will add Metoprolol 12.5 mg BID to help with pulse and elevated BP- will monitor closely   18. PEG  3/30- placed 2/24- so have to wait to pull  3/31- d/w pt  4/1- needs to be pulled 4/7- d/c 4/6- will check with GI if can pull on d/c. Will have IR pull it.   LOS: 24 days A FACE TO FACE EVALUATION WAS PERFORMED  Griffin Dewilde 04/16/2019, 10:17 AM

## 2019-04-16 NOTE — Progress Notes (Signed)
Occupational Therapy Session Note  Patient Details  Name: Andrew Bruce MRN: [id number] Date of Birth: 11-09-1965  Today's Date: 04/16/2019 OT Individual Time: 1005-1100 OT Individual Time Calculation (min): 55 min    Skilled Therapeutic Interventions/Progress Updates:    1:1 Therapeutic activities: Focus on dynamic balance. Pt ambulated to the gym with RW with supervision. Participated in balance and ability to weight shift with stable and unstable surface on the [employer]. Performed limits if stability, postural control without stable base, random control and weight shifting between two points (laterally, diagonally and vertically). Pt took seated rest breaks in between activities. Pt required min a to step up on large 6 inch platform with rails. Performed all tasks without UE support. Pt benefited from visual targets to weight shift to.  Returned to the room with supervision. Pt washed face and applied shaving cream and Ot assisting with shaving. Pt ambulated and sat in recliner to rest at conclusion to session.   Therapy Documentation Precautions:  Precautions Precautions: Fall, Other (comment) Precaution Comments: PEG, Darco Post Op shoes for ambulation Restrictions Weight Bearing Restrictions: No Other Position/Activity Restrictions: HOB elevated 30 degrees at all times Pain: No c/o pain in session   Therapy/Group: Individual Therapy  Roney Mans [facility] 04/16/2019, 10:19 AM

## 2019-04-16 NOTE — Progress Notes (Signed)
Speech Language Pathology Daily Session Note  Patient Details  Name: Andrew Bruce MRN: [id number] Date of Birth: Jul 20, 1965  Today's Date: 04/16/2019 SLP Individual Time: 1030-1100; 1300-1400 SLP Individual Time Calculation (min): 30 min; 60 min  Short Term Goals: Week 4: SLP Short Term Goal 1 (Week 4): With Min a cues, pt will complete pharyngeal exercises. SLP Short Term Goal 2 (Week 4): Pt will utilize speech intelligibility strategies (specifically increased vocal intensity) to increase seepch intelligibility >90% at the simple sentence level. SLP Short Term Goal 3 (Week 4): Given Min A cues, pt will complete 5 repetitions with EMST 150 device set at 30 cmH20 and  5 repeitions with IMST device set at 10 cmH20.  Skilled Therapeutic Interventions:  Skilled treatment session #1 targeted use of "strong voice" to read information related to himself. Pt was Mod I with use of improved voicing and as a result he was 100% intelligible at the word and phrase level.   Skilled treatment session#2 targeted speech intelligibility goals. SLP facilitated session by providing verbal description task with barrier. Pt with Mod I for use of increased vocal intensity but he spoke 1 word per breath. Description tasks simplified to answering questions related to pt. Pt was able to self-monitor and self-correct himself when speaking 1 word and then taking breath. Pt was MIn A for vocal loudness when distracted by producing more words per breath. Education provided on deficits and rationale for improving vocal loudness and speaking more words on a breath.       Pain Pain Assessment Pain Scale: 0-10 Pain Score: 0-No pain Faces Pain Scale: No hurt  Therapy/Group: Individual Therapy  Secily Walthour 04/16/2019, 2:28 PM

## 2019-04-17 DIAGNOSIS — L89150 Pressure ulcer of sacral region, unstageable: Secondary | ICD-10-CM

## 2019-04-17 LAB — GLUCOSE, CAPILLARY
Glucose-Capillary: 114 mg/dL — ABNORMAL HIGH (ref 70–99)
Glucose-Capillary: 88 mg/dL (ref 70–99)
Glucose-Capillary: 124 mg/dL — ABNORMAL HIGH (ref 70–99)
Glucose-Capillary: 105 mg/dL — ABNORMAL HIGH (ref 70–99)

## 2019-04-17 NOTE — Progress Notes (Signed)
PHYSICAL MEDICINE & REHABILITATION PROGRESS NOTE   Subjective/Complaints:  Pt without new issues. Says pain is controlled. Slept well.   ROS: limited due to language/communication    Objective:   No results found. Recent Labs    04/15/19 0620  WBC 7.5  HGB 12.0*  HCT 38.6*  PLT 476*   Recent Labs    04/15/19 0620  NA 138  K 3.9  CL 103  CO2 24  GLUCOSE 109*  BUN 10  CREATININE 0.44*  CALCIUM 9.3    Intake/Output Summary (Last 24 hours) at 04/17/2019 0936 Last data filed at 04/16/2019 1700 Gross per 24 hour  Intake 50 ml  Output --  Net 50 ml     Physical Exam: Vital Signs Blood pressure 124/90, pulse 90, temperature 98.1 F (36.7 C), resp. rate 18, height 5\' 6"  (1.676 m), weight 68.4 kg, SpO2 95 %.  Constitutional: No distress . Vital signs reviewed. HEENT: EOMI, oral membranes moist Neck: supple Cardiovascular: RRR without murmur. No JVD    Respiratory/Chest: CTA Bilaterally without wheezes or rales. Normal effort    GI/Abdomen: BS +, non-tender, non-distended Ext: no clubbing, cyanosis, or edema Psych: pleasant and cooperative Skin: demarcation noted even more of 1st L great toe and to lesser extent on other toes., 3cm sacral wound with fibronecrotic debris Psych: appropriate Musc: No edema in extremities.  No tenderness in extremities. Neuro: Ox3 Motor: Bilateral upper extremities: 4/5 proximal distal Bilateral lower extremities: 4- to 4/5 proximal to distal  Assessment/Plan: 1. 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  Physiatrist is providing close team supervision and 24 hour management of active medical problems listed below.  Physiatrist and rehab team continue to assess barriers to discharge/monitor patient progress toward functional and medical goals  Care Tool:  Bathing    Body  parts bathed by patient: Right arm, Left arm, Chest, Abdomen, Face, Front perineal area, Buttocks, Right upper leg, Left upper leg, Right lower leg, Left lower leg   Body parts bathed by helper: Front perineal area, Buttocks, Right upper leg, Left upper leg, Right lower leg, Left lower leg     Bathing assist Assist Level: Minimal Assistance - Patient > 75%     Upper Body Dressing/Undressing Upper body dressing   What is the patient wearing?: Pull over shirt    Upper body assist Assist Level: Supervision/Verbal cueing    Lower Body Dressing/Undressing Lower body dressing      What is the patient wearing?: Pants     Lower body assist Assist for lower body dressing: Minimal Assistance - Patient > 75%     Toileting Toileting Toileting Activity did not occur (Clothing management and hygiene only): N/A (no void or bm)(foley)  Toileting assist Assist for toileting: Supervision/Verbal cueing     Transfers Chair/bed transfer  Transfers assist  Chair/bed transfer activity did not occur: Safety/medical concerns(limited by dizziness, chest pain)  Chair/bed transfer assist level: Supervision/Verbal cueing Chair/bed transfer assistive device: Programmer, multimedia   Ambulation assist   Ambulation activity did not occur: Safety/medical concerns(limited by dizziness, chest pain)  Assist level: Supervision/Verbal cueing Assistive device: Walker-rolling Max distance: 157ft   Walk 10 feet activity   Assist  Walk 10 feet activity did not occur: Safety/medical concerns(limited by dizziness, chest pain)  Assist level: Supervision/Verbal cueing Assistive device: Walker-rolling   Walk 50 feet activity  Assist Walk 50 feet with 2 turns activity did not occur: Safety/medical concerns(limited by dizziness, chest pain)  Assist level: Supervision/Verbal cueing Assistive device: Walker-rolling    Walk 150 feet activity   Assist Walk 150 feet activity did not occur:  Safety/medical concerns  Assist level: Contact Guard/Touching assist Assistive device: Walker-rolling    Walk 10 feet on uneven surface  activity   Assist Walk 10 feet on uneven surfaces activity did not occur: Safety/medical concerns(limited by dizziness, chest pain)         Wheelchair     Assist     Wheelchair activity did not occur: Safety/medical concerns(limited by dizziness, chest pain)         Wheelchair 50 feet with 2 turns activity    Assist    Wheelchair 50 feet with 2 turns activity did not occur: Safety/medical concerns(limited by dizziness, chest pain)       Wheelchair 150 feet activity     Assist  Wheelchair 150 feet activity did not occur: Safety/medical concerns(limited by dizziness, chest pain)       Assessment/plan: 1.  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   Continue CIR  2.  Antithrombotics: -DVT/anticoagulation:  Pharmaceutical: Other (comment)--Eliquis             -antiplatelet therapy: N/A 3. Pain Management: Added oxycodone prn for sacral pain.   4/2- tolerating Norco- con't meds- explained will need to con't with  4. Mood: LCSW to follow for evaluation and support.              -antipsychotic agents: N/A 5. Neuropsych: This patient appears to be capable of making decisions on his own behalf. 6. Sacral decubitus/Skin/Wound Care  Sacral ulcer  Continue air mattress overlay and foam dressing, unstageable    7. Fluids/Electrolytes/Nutrition: Monitor I/Os 8. T2DM with hyperglycemia: Hgb A1c- 11.7  Levemir bid with SSI   Consulted dietician to start bolus tube feeds.    CBG (last 3)  Recent Labs    04/16/19 1657 04/16/19 2142 04/17/19 0613  GLUCAP 86 135* 114*   Novolog 3 units- will monitor  Decrease Levemir to 20 units BID   Labile, but?  Stabilizing.  Patient also appears to be eating food from home  3/29- will switch to Glipizide and Metformin since has to pay out  of pocket for meds  4/1- doing great with BGs- con't meds  4/2- BGs great- con't meds  Continue to monitor 9. Dizziness: Changed Cardura to bedtime to avoid SE. Will monitor orthostatic Bps  Improving  10. 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   Added Duonebs QID  3/30- decannulated- tolerating and doing great  4/2- healing well 12. Resting tachycardia: Due to deconditioning. No BB due to soft BP/orthostatic symptoms.  Monitor for now.    Vitals:   04/17/19 0601 04/17/19 0604  BP: 124/90   Pulse: 90   Resp: 18   Temp:    SpO2: 95% 95%   Labile up to 145/99- but overall controlled 3/30  4/2- BP 156/133- will add metoprolol 12.5 mg BID to help with elevated Bps- monitor closely 13. Gangrenous changes bilateral feet: off loading shoes.  3/29- will call Vascular to see toes- hasn't been seen by vascular yet.   4/2- pt refused amputaiton- discussed my concerns and what it means to "autoamputate"- via interpretor- pt felt like he understands.   Continue to monitor.  14. Abnormal LFTs:  3/10- mild elevation of ALT only- will con't to monitor 15. ABLA: Due to critical illness.   4/1- Hb up to 12k-con't regimen 16. Right Vocal fold immobility, dysphonia:s/p transnasal fiberoptic laryngoscopy by ENT on 3/9 but was aborted due to gagging and vomiting.   3/23- injected vocal folds with Prolaryn R>L and kenalog on R due to being tethetered  4/1- talking well- can understand and on D3 thins now 17.  Labile blood pressure  Monitor for trend  3/31- controlled overall  4/3: metoprolol added yesterday. Some improvement--observe for pattern today  18. PEG  3/30- placed 2/24- so have to wait to pull  3/31- d/w pt  4/1- needs to be pulled 4/7- d/c 4/6- will check with GI if can pull on d/c. Will have IR pull it.   LOS: 25 days A FACE TO FACE EVALUATION WAS PERFORMED  Ranelle Oyster 04/17/2019, 9:36 AM

## 2019-04-18 ENCOUNTER — Inpatient Hospital Stay (HOSPITAL_COMMUNITY): Payer: Self-pay

## 2019-04-18 LAB — GLUCOSE, CAPILLARY
Glucose-Capillary: 111 mg/dL — ABNORMAL HIGH (ref 70–99)
Glucose-Capillary: 92 mg/dL (ref 70–99)
Glucose-Capillary: 145 mg/dL — ABNORMAL HIGH (ref 70–99)
Glucose-Capillary: 121 mg/dL — ABNORMAL HIGH (ref 70–99)

## 2019-04-18 NOTE — Progress Notes (Signed)
[place] PHYSICAL MEDICINE & REHABILITATION PROGRESS NOTE   Subjective/Complaints:  Slept again. No pain. No breathing issues.   ROS: limited due to language/communication    Objective:   No results found. No results for input(s): WBC, HGB, HCT, PLT in the last 72 hours. No results for input(s): NA, K, CL, CO2, GLUCOSE, BUN, CREATININE, CALCIUM in the last 72 hours.  Intake/Output Summary (Last 24 hours) at 04/18/2019 0825 Last data filed at 04/18/2019 0603 Gross per 24 hour  Intake 240 ml  Output 550 ml  Net -310 ml     Physical Exam: Vital Signs Blood pressure 138/87, pulse 87, temperature 98.2 F (36.8 C), temperature source Oral, resp. rate 20, height 5\' 6"  (1.676 m), weight 68.4 kg, SpO2 96 %.  Constitutional: No distress . Vital signs reviewed. HEENT: EOMI, oral membranes moist Neck: supple Cardiovascular: RRR without murmur. No JVD    Respiratory/Chest: CTA Bilaterally without wheezes or rales. Normal effort    GI/Abdomen: BS +, non-tender, non-distended Ext: no clubbing, cyanosis, or edema Psych: pleasant and cooperative Skin: demarcation more so over left 1st toe than others.  3cm sacral wound with fibronecrotic debris Psych: appropriate Musc: No edema in extremities.  No tenderness in extremities. Neuro: Ox3 Motor: Bilateral upper extremities: 4/5 proximal distal Bilateral lower extremities: 4- to 4/5 proximal to distal  Assessment/Plan: 1. 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  Physiatrist is providing close team supervision and 24 hour management of active medical problems listed below.  Physiatrist and rehab team continue to assess barriers to discharge/monitor patient progress toward functional and medical goals  Care Tool:  Bathing    Body parts bathed by patient: Right arm, Left arm, Chest,  Abdomen, Face, Front perineal area, Buttocks, Right upper leg, Left upper leg, Right lower leg, Left lower leg   Body parts bathed by helper: Front perineal area, Buttocks, Right upper leg, Left upper leg, Right lower leg, Left lower leg     Bathing assist Assist Level: Minimal Assistance - Patient > 75%     Upper Body Dressing/Undressing Upper body dressing   What is the patient wearing?: Pull over shirt    Upper body assist Assist Level: Supervision/Verbal cueing    Lower Body Dressing/Undressing Lower body dressing      What is the patient wearing?: Pants     Lower body assist Assist for lower body dressing: Minimal Assistance - Patient > 75%     Toileting Toileting Toileting Activity did not occur (Clothing management and hygiene only): N/A (no void or bm)(foley)  Toileting assist Assist for toileting: Supervision/Verbal cueing     Transfers Chair/bed transfer  Transfers assist  Chair/bed transfer activity did not occur: Safety/medical concerns(limited by dizziness, chest pain)  Chair/bed transfer assist level: Supervision/Verbal cueing Chair/bed transfer assistive device: Programmer, multimedia   Ambulation assist   Ambulation activity did not occur: Safety/medical concerns(limited by dizziness, chest pain)  Assist level: Supervision/Verbal cueing Assistive device: Walker-rolling Max distance: 1105ft   Walk 10 feet activity   Assist  Walk 10 feet activity did not occur: Safety/medical concerns(limited by dizziness, chest pain)  Assist level: Supervision/Verbal cueing Assistive device: Walker-rolling   Walk 50 feet activity   Assist Walk 50 feet with 2 turns activity did not occur: Safety/medical concerns(limited by dizziness, chest pain)  Assist level: Supervision/Verbal cueing Assistive device: Walker-rolling    Walk  150 feet activity   Assist Walk 150 feet activity did not occur: Safety/medical concerns  Assist level: Contact  Guard/Touching assist Assistive device: Walker-rolling    Walk 10 feet on uneven surface  activity   Assist Walk 10 feet on uneven surfaces activity did not occur: Safety/medical concerns(limited by dizziness, chest pain)         Wheelchair     Assist     Wheelchair activity did not occur: Safety/medical concerns(limited by dizziness, chest pain)         Wheelchair 50 feet with 2 turns activity    Assist    Wheelchair 50 feet with 2 turns activity did not occur: Safety/medical concerns(limited by dizziness, chest pain)       Wheelchair 150 feet activity     Assist  Wheelchair 150 feet activity did not occur: Safety/medical concerns(limited by dizziness, chest pain)       Assessment/plan: 1.  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   Continue CIR  2.  Antithrombotics: -DVT/anticoagulation:  Pharmaceutical: Other (comment)--Eliquis             -antiplatelet therapy: N/A 3. Pain Management:  .   4/4- tolerating Norco for pain 4. Mood: LCSW to follow for evaluation and support.              -antipsychotic agents: N/A 5. Neuropsych: This patient appears to be capable of making decisions on his own behalf. 6. Sacral decubitus/Skin/Wound Care  Sacral ulcer  Continue air mattress overlay and foam dressing, unstageable    7. Fluids/Electrolytes/Nutrition: Monitor I/Os 8. T2DM with hyperglycemia: Hgb A1c- 11.7  Levemir bid with SSI   Consulted dietician to start bolus tube feeds.    CBG (last 3)  Recent Labs    04/17/19 1659 04/17/19 2135 04/18/19 0606  GLUCAP 124* 105* 111*   Novolog 3 units- will monitor  Decrease Levemir to 20 units BID   Labile, but?  Stabilizing.  Patient also appears to be eating food from home  3/29- will switch to Glipizide and Metformin since has to pay out of pocket for meds  4/1- doing great with BGs- con't meds  4/2-4- CBG's well controlled  Continue to monitor 9.  Dizziness: Changed Cardura to bedtime to avoid SE. Will monitor orthostatic Bps  Improving  10. 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   Added Duonebs QID  3/30- decannulated- tolerating and doing great  4/2- healing well 12. Resting tachycardia: Due to deconditioning. No BB due to soft BP/orthostatic symptoms.  Monitor for now.    Vitals:   04/17/19 2100 04/18/19 0556  BP:  138/87  Pulse:  87  Resp:  20  Temp:  98.2 F (36.8 C)  SpO2: 98% 96%   Labile up to 145/99- but overall controlled 3/30  4/2- BP 156/133- will add metoprolol 12.5 mg BID to help with elevated Bps- monitor closely 13. Gangrenous changes bilateral feet: off loading shoes.  3/29- will call Vascular to see toes- hasn't been seen by vascular yet.   4/2- pt refused amputaiton- discussed my concerns and what it means to "autoamputate"- via interpretor- pt felt like he understands.   Continue to monitor.  14. Abnormal LFTs:   3/10- mild elevation of ALT only- will con't to monitor 15. ABLA: Due to critical illness.   4/1- Hb up to 12k-con't regimen 16. Right Vocal fold immobility, dysphonia:s/p transnasal fiberoptic laryngoscopy by ENT on 3/9  but was aborted due to gagging and vomiting.   3/23- injected vocal folds with Prolaryn R>L and kenalog on R due to being tethetered  4/1- talking well- can understand and on D3 thins now 17.  Labile blood pressure  Monitor for trend  3/31- controlled overall  4/3: metoprolol added yesterday. Some improvement--observe for pattern today  18. PEG  3/30- placed 2/24- so have to wait to pull  3/31- d/w pt  4/1- needs to be pulled 4/7- d/c 4/6- will check with GI if can pull on d/c. Will have IR pull it.   LOS: 26 days A FACE TO FACE EVALUATION WAS PERFORMED  Ranelle Oyster 04/18/2019, 8:25 AM

## 2019-04-18 NOTE — Progress Notes (Signed)
Physical Therapy Session Note  Patient Details  Name: Andrew Bruce MRN: [id number] Date of Birth: 1965-05-27  Today's Date: 04/18/2019 PT Individual Time: 0907-1000 PT Individual Time Calculation (min): 53 min   Short Term Goals: Week 4:  PT Short Term Goal 1 (Week 4): STG=LTG due to ELOS.  Skilled Therapeutic Interventions/Progress Updates:     Patient in recliner in the room upon PT arrival. Patient alert and agreeable to PT session. Patient reported 6/10 L toe/foot pain during session, RN made aware. PT provided repositioning, rest breaks, and distraction as pain interventions throughout session. Utilized Stratus interpretation device ~10 min at beginning of session prior to arrival of live interpreter, who remained present the remainder of the session. Assessed patient's toes, noted increased dryness in B great toes and R second toe this session. Educated patient assessing his feet 2x per day and looking for signs and symptoms of infection upon d/c, will provide handout in Spanish during next session.   PT donned B Darco post-op shoes with total A for pain management due to PEG tube. Shoes remained donned until end of session.  Therapeutic Activity: Bed Mobility: Patient performed sit to supine independently in a flat bed. Transfers: Patient performed sit to/from stand x9 with supervision-mod I with and without an AD. Provided verbal cues for use of UEs to control descent x1.  Gait Training:  Patient ambulated ~70 feet, 484 feet during a Six Minute Walk Test, and 200 feet using RW with supervision. Ambulated with step-through gait pattern, decreased gait speed, decreased B step length, decreased knee and hip flexion on R during swing phase, rounded shoulders and intermittent downward head gaze. Provided verbal cues for erect posture and increased R step height. Six Minute Walk Test: Distance: 484 feet Time: 51 sec, ended test due to patient requiring sitting rest break Prior to  test: HR 105, SPO2 99% on RA, RPE 6/10 End of test: HR 115, SPO2 95% on RA, RPE 10/10  Neuromuscular Re-ed: Patient performed the Berg Balance Scale (see details below): Patient demonstrates increased fall risk as noted by score of   38/56 on Berg Balance Scale.  (<36= high risk for falls, close to 100%; 37-45 significant >80%; 46-51 moderate >50%; 52-55 lower >25%)  Patient in bed at end of session with breaks locked, bed alarm set, and all needs within reach.    Therapy Documentation Precautions:  Precautions Precautions: Fall, Other (comment) Precaution Comments: PEG, Darco Post Op shoes for ambulation Restrictions Weight Bearing Restrictions: No  Balance: Berg Balance Test Sit to Stand: Able to stand  independently using hands Standing Unsupported: Able to stand safely 2 minutes Sitting with Back Unsupported but Feet Supported on Floor or Stool: Able to sit safely and securely 2 minutes Stand to Sit: Controls descent by using hands Transfers: Able to transfer safely, definite need of hands Standing Unsupported with Eyes Closed: Able to stand 10 seconds safely Standing Ubsupported with Feet Together: Able to place feet together independently and stand 1 minute safely From Standing, Reach Forward with Outstretched Arm: Reaches forward but needs supervision From Standing Position, Pick up Object from Floor: Able to pick up shoe, needs supervision From Standing Position, Turn to Look Behind Over each Shoulder: Turn sideways only but maintains balance Turn 360 Degrees: Able to turn 360 degrees safely but slowly Standing Unsupported, Alternately Place Feet on Step/Stool: Able to complete >2 steps/needs minimal assist Standing Unsupported, One Foot in Front: Able to plae foot ahead of the other independently and hold  30 seconds Standing on One Leg: Tries to lift leg/unable to hold 3 seconds but remains standing independently Total Score: 38/56    Therapy/Group: Individual  Therapy  Ramyah Pankowski L Brittyn Salaz PT, DPT  04/18/2019, 12:37 PM

## 2019-04-19 ENCOUNTER — Inpatient Hospital Stay (HOSPITAL_COMMUNITY): Payer: Self-pay

## 2019-04-19 ENCOUNTER — Inpatient Hospital Stay (HOSPITAL_COMMUNITY): Payer: Self-pay | Admitting: Occupational Therapy

## 2019-04-19 ENCOUNTER — Inpatient Hospital Stay (HOSPITAL_COMMUNITY): Payer: Self-pay | Admitting: Speech Pathology

## 2019-04-19 LAB — GLUCOSE, CAPILLARY
Glucose-Capillary: 109 mg/dL — ABNORMAL HIGH (ref 70–99)
Glucose-Capillary: 139 mg/dL — ABNORMAL HIGH (ref 70–99)
Glucose-Capillary: 117 mg/dL — ABNORMAL HIGH (ref 70–99)
Glucose-Capillary: 90 mg/dL (ref 70–99)

## 2019-04-19 LAB — BASIC METABOLIC PANEL
Anion gap: 11 (ref 5–15)
BUN: 14 mg/dL (ref 6–20)
CO2: 25 mmol/L (ref 22–32)
Calcium: 9 mg/dL (ref 8.9–10.3)
Chloride: 103 mmol/L (ref 98–111)
Creatinine, Ser: 0.38 mg/dL — ABNORMAL LOW (ref 0.61–1.24)
GFR calc Af Amer: >60 mL/min (ref >60)
GFR calc non Af Amer: >60 mL/min (ref >60)
Glucose, Bld: 112 mg/dL — ABNORMAL HIGH (ref 70–99)
Potassium: 3.2 mmol/L — ABNORMAL LOW (ref 3.5–5.1)
Sodium: 139 mmol/L (ref 135–145)

## 2019-04-19 LAB — CBC
HCT: 38.3 % — ABNORMAL LOW (ref 39.0–52.0)
Hemoglobin: 11.8 g/dL — ABNORMAL LOW (ref 13.0–17.0)
MCH: 26.4 pg (ref 26.0–34.0)
MCHC: 30.8 g/dL (ref 30.0–36.0)
MCV: 85.7 fL (ref 80.0–100.0)
Platelets: 387 10*3/uL (ref 150–400)
RBC: 4.47 MIL/uL (ref 4.22–5.81)
RDW: 16.7 % — ABNORMAL HIGH (ref 11.5–15.5)
WBC: 7.3 10*3/uL (ref 4.0–10.5)
nRBC: 0 % (ref 0.0–0.2)

## 2019-04-19 MED ORDER — POTASSIUM CHLORIDE CRYS ER 20 MEQ PO TBCR
40.0000 meq | EXTENDED_RELEASE_TABLET | Freq: Two times a day (BID) | ORAL | Status: AC
  Administered 2019-04-19 (×2): 40 meq via ORAL
  Filled 2019-04-19 (×2): qty 2

## 2019-04-19 MED ORDER — POTASSIUM CHLORIDE CRYS ER 10 MEQ PO TBCR
10.0000 meq | EXTENDED_RELEASE_TABLET | Freq: Every day | ORAL | Status: DC
  Administered 2019-04-20: 10 meq via ORAL
  Filled 2019-04-19: qty 1

## 2019-04-19 NOTE — Progress Notes (Signed)
Physical Therapy Discharge Summary  Patient Details  Name: Andrew Bruce MRN: [id number] Date of Birth: 14-Apr-1965  Today's Date: 04/19/2019 PT Individual Time: 3220-2542 PT Individual Time Calculation (min): 75 min    Patient has met 10 of 13 long term goals due to improved activity tolerance, improved balance, improved postural control, increased strength, increased range of motion, decreased pain, ability to compensate for deficits, improved awareness and improved coordination.  Patient to discharge at an ambulatory level Supervision.   Patient's care partner is independent to provide the necessary physical assistance at discharge.  Reasons goals not met: Patient is a functional Ambulator and did not need to meet his w/c goals at d/c for household or community mobility.   Recommendation:  Patient will benefit from ongoing skilled PT services in outpatient setting to continue to advance safe functional mobility, address ongoing impairments in balance, strength, functional mobility, gait and stair training, coordination/motor planning, skin integrity, patient/caregiver education, and minimize fall risk.  Equipment: RW  Reasons for discharge: treatment goals met  Patient/family agrees with progress made and goals achieved: Yes  Skilled Therapeutic Intervention: Patient in recliner in the room upon PT arrival. Patient alert and agreeable to PT session. Patient reported 6/10 L foot pain during session, RN made aware. PT provided repositioning, rest breaks, and distraction as pain interventions throughout session.  Removed B non-skid socks to assess feet, no visible changes since yesterday. Lacking sensation in R fist and second toes and L first and third toes, otherwise able to detect light touch WNL. Educated patient on signs and symptoms of infection, performing daily foot checks, and having OP therapist assess his feet for changes. Also discussed following-up with the vascular surgeon in  the pain get worse or any signs of infection occur. Donned B non-skid socks and Darco post op shoes with total A for time and pain management.   Therapeutic Activity: Bed Mobility: Patient performed rolling R/L and supine to/from sit independently in the ADL bed.  Transfers: Patient performed sit to/from stand from the recliner, mat table, standard chair, ADL recliner, ADL couch, and ADL bed with mod I using a RW. He ambulated to the bathroom in the room and voided standing over the toilet with distant supervision for safety at the beginning of session. Patient performed a simulated sedan height car transfer with supervision using RW performing sitting then bringing LEs into the vehicle for safety without cues from therapist.   Gait Training:  Patient ambulated 100-150 feet several trials using RW with supervision for safety. Ambulated with decreased gait speed, decreased B step length, and mild forward trunk flexion. Provided verbal cues for erect posture and increased foot clearance R>L for safety. Patient ambulated up/down a ramp, over 10 feet of mulch (unlevel surface), and up/down a curb x2 to simulate community ambulation over unlevel surfaces with CGA-close supervision using RW.  Patient ascended/descended 8-3" steps and 8-6" steps using R rail with B UE support with CGA. Performed side stepping with step-to gait pattern. Educated on safe gaurding position for family at home, patient stated understanding and able to teach back to PT.  Therapeutic Exercise: Patient performed the following exercises with verbal and tactile cues for proper technique. Introduced HEP for [employer] exercises to address strength and balance. Pt performed 5 reps x 1 sets for BLE including LAQ with 5 second hold, standing hip abduction, standing hamstring curls, mini squats, and sit to stands. Provided as HEP handout to perform 8-10 reps, 1 set, 2x per day.  Patient in bed at end of session with breaks locked and all  needs within reach. Patient able to teach back education on fall risk/prevention and activation of emergency services in the event of a fall from previous family education session.    PT Discharge Precautions/Restrictions Precautions Precautions: Fall Precaution Comments: PEG (to be pulled prior to d/c) Required Braces or Orthoses: Other Brace Other Brace: Darco Post Op shoes Restrictions Weight Bearing Restrictions: Yes RLE Weight Bearing: Partial weight bearing RLE Partial Weight Bearing Percentage or Pounds: heel weight bearing only LLE Weight Bearing: Partial weight bearing LLE Partial Weight Bearing Percentage or Pounds: heel weight bearing only Vision/Perception  Vision - Assessment Eye Alignment: Within Functional Limits Alignment/Gaze Preference: Within Defined Limits Tracking/Visual Pursuits: Able to track stimulus in all quads without difficulty Saccades: Within functional limits Perception Perception: Within Functional Limits Praxis Praxis: Intact  Cognition Overall Cognitive Status: Within Functional Limits for tasks assessed Arousal/Alertness: Awake/alert Focused Attention: Appears intact Sustained Attention: Appears intact Memory: Appears intact Problem Solving: Appears intact Sequencing: Appears intact Self Monitoring: Appears intact Safety/Judgment: Appears intact Sensation Sensation Light Touch: Impaired Detail Light Touch Impaired Details: Impaired LLE;Impaired RLE Proprioception Impaired Details: Impaired RUE Additional Comments: R first and second toes and L first and third toes without sensation over necrotic tissue, otherwise sensation intact Coordination Gross Motor Movements are Fluid and Coordinated: No Fine Motor Movements are Fluid and Coordinated: No Coordination and Movement Description: Mild R UE dysmetria, generalized weakness and decreased balance strategies with Darco shoes Heel Shin Test: [facility] Motor  Motor Motor: Hemiplegia;Abnormal  tone Motor - Skilled Clinical Observations: mild R hemiplegia Motor - Discharge Observations: Strength and motor control/coordination of LEs much improved, R UE with continued dysmetria with functional tasks and fine motor skills  Mobility Bed Mobility Bed Mobility: Rolling Right;Rolling Left;Supine to Sit;Sit to Supine Rolling Right: Independent Rolling Left: Independent Supine to Sit: Independent Sit to Supine: Independent Transfers Transfers: Sit to Stand;Stand to Sit;Stand Pivot Transfers Sit to Stand: Independent with assistive device Stand to Sit: Independent with assistive device Stand Pivot Transfers: Supervision/Verbal cueing Transfer (Assistive device): Rolling walker Locomotion  Gait Ambulation: Yes Gait Assistance: Supervision/Verbal cueing Gait Distance (Feet): 484 Feet Assistive device: Rolling walker Gait Gait: Yes Gait Pattern: Decreased step length - left;Decreased step length - right;Decreased stride length;Decreased hip/knee flexion - right;Decreased dorsiflexion - right;Right foot flat;Left foot flat;Decreased trunk rotation;Trunk flexed;Narrow base of support Gait velocity: decreased Stairs / Additional Locomotion Stairs: Yes Stairs Assistance: Contact Guard/Touching assist Stair Management Technique: One rail Right Number of Stairs: 8 Height of Stairs: 6 Curb: Contact Guard/Touching assist(with RW) Wheelchair Mobility Wheelchair Mobility: No(Patient is a Engineer, petroleum)  Trunk/Postural Assessment  Cervical Assessment Cervical Assessment: Exceptions to WFL(forward head) Thoracic Assessment Thoracic Assessment: Exceptions to WFL(rounded shoulders) Lumbar Assessment Lumbar Assessment: Exceptions to WFL(posterior pelvic tilt) Postural Control Postural Control: Deficits on evaluation(decreased delayed) Righting Reactions: improved with slight delay Protective Responses: decreased/delayed and limited by Darco shoes  Balance Balance Balance  Assessed: Yes Static Sitting Balance Static Sitting - Balance Support: No upper extremity supported;Feet unsupported Static Sitting - Level of Assistance: 7: Independent Dynamic Sitting Balance Dynamic Sitting - Balance Support: No upper extremity supported;Feet supported Dynamic Sitting - Level of Assistance: 7: Independent Dynamic Sitting - Balance Activities: Lateral lean/weight shifting;Forward lean/weight shifting;Reaching for objects Static Standing Balance Static Standing - Balance Support: During functional activity;No upper extremity supported Static Standing - Level of Assistance: 7: Independent Dynamic Standing Balance Dynamic Standing - Balance Support: Right upper extremity  supported;Left upper extremity supported;During functional activity Dynamic Standing - Level of Assistance: 6: Modified independent (Device/Increase time) Dynamic Standing - Balance Activities: Lateral lean/weight shifting;Forward lean/weight shifting;Reaching for objects Extremity Assessment  RLE Assessment RLE Assessment: Exceptions to [facility] Active Range of Motion (AROM) Comments: WFL for all functional mobility with tight gastroc limiting DF around neutral General Strength Comments: Grossly in sitting: hip flexion 4/5, knee extension 5/5, knee flexion 4+/5, DF 4-/5, PF 4-/5 LLE Assessment LLE Assessment: Exceptions to [facility] Active Range of Motion (AROM) Comments: WFL for all functional mobility with tight gastroc limiting DF around neutral General Strength Comments: Grossly in sitting: hip flexion 4+/5, knee extension 5/5, knee flexion 4+/5, DF 4/5, PF 4/5 Outcome Measures Six Minute Walk Test: Distance: 484 feet Time: 6mn 51 sec, ended test due to patient requiring sitting rest break Prior to test: HR 105, SPO2 99% on RA, RPE 6/10 End of test: HR 115, SPO2 95% on RA, RPE 10/10 Berg Balance Test Sit to Stand: Able to stand  independently using hands Standing Unsupported: Able to stand safely 2  minutes Sitting with Back Unsupported but Feet Supported on Floor or Stool: Able to sit safely and securely 2 minutes Stand to Sit: Controls descent by using hands Transfers: Able to transfer safely, definite need of hands Standing Unsupported with Eyes Closed: Able to stand 10 seconds safely Standing Ubsupported with Feet Together: Able to place feet together independently and stand 1 minute safely From Standing, Reach Forward with Outstretched Arm: Reaches forward but needs supervision From Standing Position, Pick up Object from Floor: Able to pick up shoe, needs supervision From Standing Position, Turn to Look Behind Over each Shoulder: Turn sideways only but maintains balance Turn 360 Degrees: Able to turn 360 degrees safely but slowly Standing Unsupported, Alternately Place Feet on Step/Stool: Able to complete >2 steps/needs minimal assist Standing Unsupported, One Foot in Front: Able to plae foot ahead of the other independently and hold 30 seconds Standing on One Leg: Tries to lift leg/unable to hold 3 seconds but remains standing independently Total Score: 38/56 Patient demonstrates increased fall risk as noted by score of   38/56 on Berg Balance Scale.  (<36= high risk for falls, close to 100%; 37-45 significant >80%; 46-51 moderate >50%; 52-55 lower >25%)     Hanh Kertesz L Gaelle Adriance PT, DPT  04/19/2019, 6:30 AM

## 2019-04-19 NOTE — Progress Notes (Signed)
Speech Language Pathology Discharge Summary  Patient Details  Name: Andrew Bruce MRN: [id number] Date of Birth: 01-Dec-1965  Today's Date: 04/19/2019 SLP Individual Time: 1110-1140 SLP Individual Time Calculation (min): 30 min  Skilled Therapeutic Interventions:   Pt was seen for education regarding oropharyngeal strengthening exercises and strategies to increase intelligibility and vocal loudness. Pt was instructed to refrain from using IMST/EMST devices  due to trach stoma still healing. Will defer to outpatient speech therapist to recommend resuming this modality when appropriate. Pt has made significant improvement during this rehab stay. On admission, pt required Passy-Muir speaking valve for verbal communication. He was NPO with PEG tube feedings, and was profoundly weak and confused. At discharge, pt has been decannulated, tolerates mechanical soft diet with thin liquids, and is functional with cognitive-linguistic skills. Continued skilled speech therapy is recommended on an outpatient basis, focusing on pharyngeal strengthening, vocal strength s/p decannulation, and use of IMST/EMST when appropriate.   Patient has met 3 of 3 long term goals.  Patient to discharge at overall Modified Independent level.   Clinical Impression/Discharge Summary:    Care Partner:  Caregiver Able to Provide Assistance: Yes  Type of Caregiver Assistance: Physical  Recommendation:  Outpatient SLP  Rationale for SLP Follow Up: Maximize functional communication;Maximize swallowing safety   Equipment: IMST, EMST   Reasons for discharge: Discharged from hospital   Patient/Family Agrees with Progress Made and Goals Achieved: Yes   Sherl Yzaguirre B. Quentin Ore, [facility], CCC-SLP Speech Language Pathologist  Shonna Chock 04/19/2019, 12:34 PM

## 2019-04-19 NOTE — Plan of Care (Signed)
  Problem: Consults Goal: RH GENERAL PATIENT EDUCATION Description: See Patient Education module for education specifics. Outcome: Progressing Goal: Skin Care Protocol Initiated - if Braden Score 18 or less Description: If consults are not indicated, leave blank or document N/A Outcome: Progressing Goal: Nutrition Consult-if indicated Outcome: Progressing Goal: Diabetes Guidelines if Diabetic/Glucose > 140 Description: If diabetic or lab glucose is > 140 mg/dl - Initiate Diabetes/Hyperglycemia Guidelines & Document Interventions  Outcome: Progressing   Problem: RH BOWEL ELIMINATION Goal: RH STG MANAGE BOWEL WITH ASSISTANCE Description: STG Manage Bowel with mod I Assistance. Outcome: Progressing   Problem: RH BLADDER ELIMINATION Goal: RH STG MANAGE BLADDER WITH ASSISTANCE Description: STG Manage Bladder With mod I Assistance Outcome: Progressing   Problem: RH SKIN INTEGRITY Goal: RH STG SKIN FREE OF INFECTION/BREAKDOWN Description: No new areas of break down while on CIR Outcome: Progressing Goal: RH STG MAINTAIN SKIN INTEGRITY WITH ASSISTANCE Description: STG Maintain Skin Integrity With mod I Assistance. Outcome: Progressing

## 2019-04-19 NOTE — Progress Notes (Addendum)
[facility] PHYSICAL MEDICINE & REHABILITATION PROGRESS NOTE   Subjective/Complaints:  Pt reports he wants to know when PEG is being removed- it's bugging him- wants it out prior to dc.  Is being d/c'd tomorrow.  Still having some pain in toes, but meds helpful.   Since K+ is back down to 3.2- will replete and add daily KcL although no reason I can see to be wasting potassium.    ROS: limited due to language/communication    Objective:   No results found. Recent Labs    04/19/19 0523  WBC 7.3  HGB 11.8*  HCT 38.3*  PLT 387   Recent Labs    04/19/19 0523  NA 139  K 3.2*  CL 103  CO2 25  GLUCOSE 112*  BUN 14  CREATININE 0.38*  CALCIUM 9.0    Intake/Output Summary (Last 24 hours) at 04/19/2019 1516 Last data filed at 04/19/2019 1245 Gross per 24 hour  Intake 360 ml  Output 250 ml  Net 110 ml     Physical Exam: Vital Signs Blood pressure 128/88, pulse 86, temperature 97.7 F (36.5 C), resp. rate 20, height 5\' 6"  (1.676 m), weight 68.5 kg, SpO2 100 %.  Constitutional: laying supine in bed; hat on head, NAD HEENT: EOMI, oral membranes moist Neck: supple Cardiovascular: RRR_ no JVD    Respiratory/Chest: CTA b/L good air movement GI/Abdomen: soft, NT, ND, (+)BS Ext: no clubbing, cyanosis, or edema Psych: pleasant and cooperative Skin: demarcation on L great toe- stable- other toes stable but better than 1st L toe Psych: appropriate Musc: no LE edema Neuro: Ox3 Motor: Bilateral upper extremities: 4/5 proximal distal Bilateral lower extremities: 4- to 4/5 proximal to distal  Assessment/Plan: 1. 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  Physiatrist is providing close team supervision and 24 hour management of active medical problems listed below.  Physiatrist and rehab team continue to assess barriers to  discharge/monitor patient progress toward functional and medical goals  Care Tool:  Bathing    Body parts bathed by patient: Right arm, Left arm, Chest, Abdomen, Face, Front perineal area, Buttocks, Right upper leg, Left upper leg, Right lower leg, Left lower leg   Body parts bathed by helper: Front perineal area, Buttocks, Right upper leg, Left upper leg, Right lower leg, Left lower leg     Bathing assist Assist Level: Supervision/Verbal cueing     Upper Body Dressing/Undressing Upper body dressing   What is the patient wearing?: Pull over shirt    Upper body assist Assist Level: Independent    Lower Body Dressing/Undressing Lower body dressing      What is the patient wearing?: Pants     Lower body assist Assist for lower body dressing: Set up assist     Toileting Toileting Toileting Activity did not occur (Clothing management and hygiene only): N/A (no void or bm)(foley)  Toileting assist Assist for toileting: Supervision/Verbal cueing     Transfers Chair/bed transfer  Transfers assist  Chair/bed transfer activity did not occur: Safety/medical concerns(limited by dizziness, chest pain)  Chair/bed transfer assist level: Independent with assistive device Chair/bed transfer assistive device:   Ambulation assist   Ambulation activity did not occur: Safety/medical concerns(limited by dizziness, chest pain)  Assist level: Supervision/Verbal cueing Assistive device: Walker-rolling Max distance: 485 ft   Walk 10 feet activity   Assist  Walk  10 feet activity did not occur: Safety/medical concerns(limited by dizziness, chest pain)  Assist level: Supervision/Verbal cueing Assistive device: Walker-rolling   Walk 50 feet activity   Assist Walk 50 feet with 2 turns activity did not occur: Safety/medical concerns(limited by dizziness, chest pain)  Assist level: Supervision/Verbal cueing Assistive device: Walker-rolling     Walk 150 feet activity   Assist Walk 150 feet activity did not occur: Safety/medical concerns  Assist level: Supervision/Verbal cueing Assistive device: Walker-rolling    Walk 10 feet on uneven surface  activity   Assist Walk 10 feet on uneven surfaces activity did not occur: Safety/medical concerns(limited by dizziness, chest pain)         Wheelchair     Assist     Wheelchair activity did not occur: Safety/medical concerns(limited by dizziness, chest pain)         Wheelchair 50 feet with 2 turns activity    Assist    Wheelchair 50 feet with 2 turns activity did not occur: Safety/medical concerns(limited by dizziness, chest pain)       Wheelchair 150 feet activity     Assist  Wheelchair 150 feet activity did not occur: Safety/medical concerns(limited by dizziness, chest pain)       Assessment/plan: 1.  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   Continue CIR  2.  Antithrombotics: -DVT/anticoagulation:  Pharmaceutical: Other (comment)--Eliquis             -antiplatelet therapy: N/A 3. Pain Management:  .   4/4- tolerating Norco for pain 4. Mood: LCSW to follow for evaluation and support.              -antipsychotic agents: N/A 5. Neuropsych: This patient appears to be capable of making decisions on his own behalf. 6. Sacral decubitus/Skin/Wound Care  Sacral ulcer  Continue air mattress overlay and foam dressing, unstageable  4/5- will reassess tomorrow before d/c    7. Fluids/Electrolytes/Nutrition: Monitor I/Os 8. T2DM with hyperglycemia: Hgb A1c- 11.7  Levemir bid with SSI   Consulted dietician to start bolus tube feeds.    CBG (last 3)  Recent Labs    04/18/19 2058 04/19/19 0633 04/19/19 1149  GLUCAP 121* 109* 139*   Novolog 3 units- will monitor  Decrease Levemir to 20 units BID   Labile, but?  Stabilizing.  Patient also appears to be eating food from home  3/29- will switch to  Glipizide and Metformin since has to pay out of pocket for meds  4/1- doing great with BGs- con't meds  4/5- BGs controlled- con't meds  Continue to monitor 9. Dizziness: Changed Cardura to bedtime to avoid SE. Will monitor orthostatic Bps  Improving  10. 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   Added Duonebs QID  3/30- decannulated- tolerating and doing great  4/2- healing well 12. Resting tachycardia: Due to deconditioning. No BB due to soft BP/orthostatic symptoms.  Monitor for now.    Vitals:   04/19/19 0616 04/19/19 1458  BP: 93/75 128/88  Pulse: 99 86  Resp:  20  Temp:    SpO2: 98% 100%   Labile up to 145/99- but overall controlled 3/30  4/2- BP 156/133- will add metoprolol 12.5 mg BID to help with elevated Bps- monitor closely  4/5- BP's a little soft, but asymptomatic and pulse somewhat better 13. Gangrenous changes bilateral feet: off loading shoes.  3/29- will call Vascular to see toes- hasn't been seen  by vascular yet.   4/2- pt refused amputaiton- discussed my concerns and what it means to "autoamputate"- via interpretor- pt felt like he understands.  4/5- will con't "autoamputation"   Continue to monitor.  14. Abnormal LFTs:   3/10- mild elevation of ALT only- will con't to monitor 15. ABLA: Due to critical illness.   4/1- Hb up to 12k-con't regimen 16. Right Vocal fold immobility, dysphonia:s/p transnasal fiberoptic laryngoscopy by ENT on 3/9 but was aborted due to gagging and vomiting.   3/23- injected vocal folds with Prolaryn R>L and kenalog on R due to being tethetered  4/1- talking well- can understand and on D3 thins now 17.  Labile blood pressure  Monitor for trend  3/31- controlled overall  4/3: metoprolol added yesterday. Some improvement--observe for pattern today  18. PEG  3/30- placed 2/24- so have to wait to pull  3/31- d/w pt  4/1- needs to be pulled 4/7- d/c 4/6- will check with GI if can pull on d/c. Will  have IR pull it.   4/5- wrote for referral to have IR pull it. No note from IR as of yet. Did place referral 33. Hypokalemia  4/5- will replete 40 mEq x2 and then put on 10 mEq daily.    LOS: 27 days A FACE TO FACE EVALUATION WAS PERFORMED  Elianah Karis 04/19/2019, 3:16 PM

## 2019-04-19 NOTE — Progress Notes (Signed)
Occupational Therapy Discharge Summary  Patient Details  Name: Andrew Bruce MRN: [id number] Date of Birth: Mar 14, 1965  Today's Date: 04/19/2019 OT Individual Time: 0900-1000 OT Individual Time Calculation (min): 60 min   Patient participated in session today with activities reassess as documented below.  He completed functional mobility and transfers with CS, self care tasks with CS.  UB reassessment as documented below.  In addition completed hand - eye coordination activities with dynavision ( it was not working properly so results slower than his ability )  At admission his right UE reaction time was > 8.5 sec seated only using lower half of screen, today right UE in stance, whole screen > 2.6 sec.   Patient has made significant gains in all areas and had exceeded 12 of 12 long term goals due to improved activity tolerance, improved balance, postural control, functional use of  RIGHT upper and RIGHT lower extremity, improved attention, improved awareness and improved coordination.  Patient to discharge at overall Supervision level.  Patient's care partner is independent to provide the necessary physical assistance at discharge.  He requires incidental assist with darco post op shoes otherwise functioning at a CS level for all self care and mobility.    Reasons goals not met: na  Recommendation:  Patient will benefit from ongoing skilled OT services in outpatient setting to continue to advance functional skills in the area of BADL, iADL, Vocation and Reduce care partner burden.  Equipment: No equipment provided  Reasons for discharge: treatment goals met  Patient/family agrees with progress made and goals achieved: Yes  OT Discharge Precautions/Restrictions  Precautions Precautions: Fall Precaution Comments: PEG (to be pulled prior to d/c) Required Braces or Orthoses: Other Brace Other Brace: Darco Post Op shoes Restrictions Weight Bearing Restrictions: Yes RLE Weight Bearing:  Partial weight bearing RLE Partial Weight Bearing Percentage or Pounds: heel weight bearing only LLE Weight Bearing: Partial weight bearing LLE Partial Weight Bearing Percentage or Pounds: heel weight bearing only General   Vital Signs   Pain Pain Assessment Pain Scale: 0-10 Pain Score: 3  Pain Location: Foot Pain Orientation: Right Pain Descriptors / Indicators: Discomfort Pain Intervention(s): Repositioned ADL ADL Eating: Independent Where Assessed-Eating: Chair Grooming: Supervision/safety Where Assessed-Grooming: Standing at sink Upper Body Bathing: Supervision/safety Where Assessed-Upper Body Bathing: Shower Lower Body Bathing: Supervision/safety Where Assessed-Lower Body Bathing: Shower Upper Body Dressing: Independent Where Assessed-Upper Body Dressing: Edge of bed Lower Body Dressing: Setup Where Assessed-Lower Body Dressing: Edge of bed Toileting: Supervision/safety Where Assessed-Toileting: Glass blower/designer: Close supervision Toilet Transfer Method: Counselling psychologist: Energy manager: Close supervision Social research officer, government Method: Heritage manager: Civil engineer, contracting with back Vision Baseline Vision/History: No visual deficits Patient Visual Report: No change from baseline Vision Assessment?: No apparent visual deficits Perception  Perception: Within Functional Limits Praxis Praxis: Intact Cognition Overall Cognitive Status: Within Functional Limits for tasks assessed Arousal/Alertness: Awake/alert Orientation Level: Oriented X4 Safety/Judgment: Appears intact Sensation Sensation Additional Comments: UB sensation grossly intact Coordination Finger Nose Finger Test: mild dysmetria right persists, left [facility] 9 Hole Peg Test: L = 33 sec, R = 1 min 40 sec.        box and blocks:  L = 52, R = 28 Motor  Motor Motor: Hemiplegia;Abnormal tone Motor - Discharge Observations: Strength and motor  control/coordination of LEs much improved, R UE with continued dysmetria with functional tasks and fine motor skills Mobility  Bed Mobility Rolling Right: Independent Rolling Left: Independent Supine to Sit: Independent  Sit to Supine: Independent Transfers Sit to Stand: Independent Stand to Sit: Independent  Trunk/Postural Assessment  Cervical Assessment Cervical Assessment: Exceptions to WFL(forward head) Thoracic Assessment Thoracic Assessment: Exceptions to WFL(rounded shoulders) Lumbar Assessment Lumbar Assessment: Exceptions to WFL(posterior pelvic tilt) Postural Control Postural Control: Deficits on evaluation(decreased delayed) Righting Reactions: improved with slight delay Protective Responses: decreased/delayed and limited by Darco shoes  Balance Balance Balance Assessed: Yes Standardized Balance Assessment Standardized Balance Assessment: Berg Balance Test Berg Balance Test Sit to Stand: Able to stand  independently using hands Standing Unsupported: Able to stand safely 2 minutes Sitting with Back Unsupported but Feet Supported on Floor or Stool: Able to sit safely and securely 2 minutes Stand to Sit: Controls descent by using hands Transfers: Able to transfer safely, definite need of hands Standing Unsupported with Eyes Closed: Able to stand 10 seconds safely Standing Ubsupported with Feet Together: Able to place feet together independently and stand 1 minute safely From Standing, Reach Forward with Outstretched Arm: Reaches forward but needs supervision From Standing Position, Pick up Object from Floor: Able to pick up shoe, needs supervision From Standing Position, Turn to Look Behind Over each Shoulder: Turn sideways only but maintains balance Turn 360 Degrees: Able to turn 360 degrees safely but slowly Standing Unsupported, Alternately Place Feet on Step/Stool: Able to complete >2 steps/needs minimal assist Standing Unsupported, One Foot in Front: Able to plae  foot ahead of the other independently and hold 30 seconds Standing on One Leg: Tries to lift leg/unable to hold 3 seconds but remains standing independently Total Score: 38 Static Sitting Balance Static Sitting - Balance Support: No upper extremity supported;Feet unsupported Static Sitting - Level of Assistance: 7: Independent Dynamic Sitting Balance Dynamic Sitting - Balance Support: No upper extremity supported;Feet supported Dynamic Sitting - Level of Assistance: 7: Independent Dynamic Sitting - Balance Activities: Lateral lean/weight shifting;Forward lean/weight shifting;Reaching for objects Static Standing Balance Static Standing - Balance Support: During functional activity;No upper extremity supported Static Standing - Level of Assistance: 7: Independent Dynamic Standing Balance Dynamic Standing - Balance Support: Right upper extremity supported;Left upper extremity supported;During functional activity Dynamic Standing - Level of Assistance: 6: Modified independent (Device/Increase time) Dynamic Standing - Balance Activities: Lateral lean/weight shifting;Forward lean/weight shifting;Reaching for objects Extremity/Trunk Assessment RUE Assessment Passive Range of Motion (PROM) Comments: able to achieve full PROM after proximal stretch Active Range of Motion (AROM) Comments: shoulder 3/4 to full, distal WFL, index finger dip limited (otherwise hand full AROM) LUE Assessment LUE Assessment: Within Functional Limits   Andrew Bruce 04/19/2019, 10:27 AM

## 2019-04-19 NOTE — Progress Notes (Signed)
IR called RN this morning and they were notified patient finishes therapy after 1430. RN called IR at 1500 to get an update and IR stated if they can't fit him in today, they will get him early in the morning. IR said they spoke to the provider and that the provider was okay with it.

## 2019-04-19 NOTE — Progress Notes (Signed)
Social Work Patient ID: [name], male   DOB: March 06, 1965, 54 y.o.   MRN: [id number]   SW scheduled new patient appt with [facility] for Thursday, March 29 at [place] with Dionisio David NP. SW requested notation to discuss financial assistance with patient.   SW met with pt in room at bedside using [facility] ID [phone] to inform on new patient appt, and being enrolled into [facility] program. Pt provided appt sheet with address/contact information. Pt states he will discuss with his family the preferred pharmacy for his medications and update [place] tomorrow.   Loralee Pacas, MSW, [place] Office: [phone] Cell: [phone] Fax: [phone]

## 2019-04-20 ENCOUNTER — Inpatient Hospital Stay (HOSPITAL_COMMUNITY): Payer: Medicaid Other

## 2019-04-20 HISTORY — PX: IR GASTROSTOMY TUBE REMOVAL: IMG5492

## 2019-04-20 LAB — GLUCOSE, CAPILLARY
Glucose-Capillary: 92 mg/dL (ref 70–99)
Glucose-Capillary: 91 mg/dL (ref 70–99)

## 2019-04-20 MED ORDER — METFORMIN HCL 500 MG PO TABS: 500.0000 mg | ORAL_TABLET | Freq: Two times a day (BID) | ORAL | 0 refills | Status: DC

## 2019-04-20 MED ORDER — COLLAGENASE 250 UNIT/GM EX OINT: TOPICAL_OINTMENT | Freq: Every day | CUTANEOUS | 1 refills | Status: DC

## 2019-04-20 MED ORDER — DOXAZOSIN MESYLATE 1 MG PO TABS: 1.0000 mg | ORAL_TABLET | Freq: Every day | ORAL | 0 refills | Status: DC

## 2019-04-20 MED ORDER — ASCORBIC ACID 500 MG PO TABS: 500.0000 mg | ORAL_TABLET | Freq: Every day | ORAL | 0 refills | Status: DC

## 2019-04-20 MED ORDER — APIXABAN 5 MG PO TABS: 5.0000 mg | ORAL_TABLET | Freq: Two times a day (BID) | ORAL | 1 refills | Status: DC

## 2019-04-20 MED ORDER — POLYSACCHARIDE IRON COMPLEX 150 MG PO CAPS: 150.0000 mg | ORAL_CAPSULE | Freq: Two times a day (BID) | ORAL | 0 refills | Status: DC

## 2019-04-20 MED ORDER — ATORVASTATIN CALCIUM 20 MG PO TABS: 20.0000 mg | ORAL_TABLET | Freq: Every day | ORAL | 0 refills | Status: DC

## 2019-04-20 MED ORDER — BLOOD GLUCOSE MONITOR KIT: PACK | 0 refills | Status: DC

## 2019-04-20 MED ORDER — GUAIFENESIN 100 MG/5ML PO SOLN: 10.0000 mL | Freq: Four times a day (QID) | ORAL | 0 refills | Status: DC | PRN

## 2019-04-20 MED ORDER — PRO-STAT SUGAR FREE PO LIQD: 30.0000 mL | Freq: Three times a day (TID) | ORAL | 0 refills | Status: DC

## 2019-04-20 MED ORDER — SENNOSIDES-DOCUSATE SODIUM 8.6-50 MG PO TABS: 1.0000 | ORAL_TABLET | Freq: Every evening | ORAL | 0 refills | Status: DC | PRN

## 2019-04-20 MED ORDER — ZINC SULFATE 220 (50 ZN) MG PO CAPS: 220.0000 mg | ORAL_CAPSULE | Freq: Every day | ORAL | 0 refills | Status: DC

## 2019-04-20 MED ORDER — LIDOCAINE VISCOUS HCL 2 % MT SOLN
OROMUCOSAL | Status: AC
  Filled 2019-04-20: qty 15

## 2019-04-20 MED ORDER — ACETAMINOPHEN 325 MG PO TABS: 325.0000 mg | ORAL_TABLET | ORAL | 0 refills | Status: DC | PRN

## 2019-04-20 MED ORDER — METOPROLOL TARTRATE 25 MG PO TABS: 12.5000 mg | ORAL_TABLET | Freq: Two times a day (BID) | ORAL | 0 refills | Status: DC

## 2019-04-20 MED ORDER — TRAZODONE HCL 50 MG PO TABS: 25.0000 mg | ORAL_TABLET | Freq: Every evening | ORAL | 0 refills | Status: DC | PRN

## 2019-04-20 MED ORDER — HYPROMELLOSE (GONIOSCOPIC) 2.5 % OP SOLN: 1.0000 [drp] | Freq: Four times a day (QID) | OPHTHALMIC | 12 refills | Status: DC | PRN

## 2019-04-20 MED ORDER — POTASSIUM CHLORIDE CRYS ER 10 MEQ PO TBCR: 10.0000 meq | EXTENDED_RELEASE_TABLET | Freq: Every day | ORAL | 0 refills | Status: DC

## 2019-04-20 MED ORDER — LIDOCAINE VISCOUS HCL 2 % MT SOLN
OROMUCOSAL | Status: DC | PRN
  Administered 2019-04-20: 15 mL via OROMUCOSAL

## 2019-04-20 MED ORDER — FAMOTIDINE 20 MG PO TABS: 20.0000 mg | ORAL_TABLET | Freq: Two times a day (BID) | ORAL | 1 refills | Status: DC

## 2019-04-20 MED ORDER — GLIPIZIDE 5 MG PO TABS: 2.5000 mg | ORAL_TABLET | Freq: Every day | ORAL | 0 refills | Status: DC

## 2019-04-20 NOTE — Progress Notes (Signed)
[facility] PHYSICAL MEDICINE & REHABILITATION PROGRESS NOTE   Subjective/Complaints:  Pt got K+ repletion- didn't recheck since leaving today.   Is getting PEG pulled today by IR.  Pt reports bottom feeling good and wife trained in care.  Janina Mayo site still open a little- doesn't have to push on it to talk.   Size of bottom/sacral wound 3.8x1.8 x 0.8cm- granulating in well.   ROS: limited due to language-/lack of spanish   Objective:   No results found. Recent Labs    04/19/19 0523  WBC 7.3  HGB 11.8*  HCT 38.3*  PLT 387   Recent Labs    04/19/19 0523  NA 139  K 3.2*  CL 103  CO2 25  GLUCOSE 112*  BUN 14  CREATININE 0.38*  CALCIUM 9.0    Intake/Output Summary (Last 24 hours) at 04/20/2019 0950 Last data filed at 04/20/2019 0415 Gross per 24 hour  Intake 340 ml  Output 850 ml  Net -510 ml     Physical Exam: Vital Signs Blood pressure (!) 130/93, pulse 89, temperature 98.9 F (37.2 C), temperature source Oral, resp. rate 18, height 5\' 6"  (1.676 m), weight 68.6 kg, SpO2 97 %.  Constitutional: supine in bed; NAD HEENT:conjugate gaze Neck: supple Cardiovascular: RRR Respiratory/Chest: CTA b/L good air movement GI/Abdomen: soft, NT, ND, (+)BS Ext: no clubbing, cyanosis, or edema Psych: pleasant and cooperative Skin: demarcation on L great toe- stable- other toes stable but better than 1st L toe Wound on sacrum filling in well- 3.8x1.8c 0.8cm Psych: appropriate Musc: no LE edema Neuro: Ox3 Motor: Bilateral upper extremities: 4/5 proximal distal Bilateral lower extremities: 4- to 4/5 proximal to distal  Assessment/Plan: 1. 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  Physiatrist is providing close team supervision and 24 hour management of active medical problems listed below.  Physiatrist and rehab team  continue to assess barriers to discharge/monitor patient progress toward functional and medical goals  Care Tool:  Bathing    Body parts bathed by patient: Right arm, Left arm, Chest, Abdomen, Face, Front perineal area, Buttocks, Right upper leg, Left upper leg, Right lower leg, Left lower leg   Body parts bathed by helper: Front perineal area, Buttocks, Right upper leg, Left upper leg, Right lower leg, Left lower leg     Bathing assist Assist Level: Supervision/Verbal cueing     Upper Body Dressing/Undressing Upper body dressing   What is the patient wearing?: Pull over shirt    Upper body assist Assist Level: Independent    Lower Body Dressing/Undressing Lower body dressing      What is the patient wearing?: Pants     Lower body assist Assist for lower body dressing: Set up assist     Toileting Toileting Toileting Activity did not occur (Clothing management and hygiene only): N/A (no void or bm)(foley)  Toileting assist Assist for toileting: Supervision/Verbal cueing     Transfers Chair/bed transfer  Transfers assist  Chair/bed transfer activity did not occur: Safety/medical concerns(limited by dizziness, chest pain)  Chair/bed transfer assist level: Independent with assistive device Chair/bed transfer assistive device:   Ambulation assist   Ambulation activity did not occur: Safety/medical concerns(limited by dizziness, chest pain)  Assist level: Supervision/Verbal cueing Assistive device: Walker-rolling Max distance: 485 ft   Walk 10 feet activity   Assist  Walk 10 feet activity did  not occur: Safety/medical concerns(limited by dizziness, chest pain)  Assist level: Supervision/Verbal cueing Assistive device: Walker-rolling   Walk 50 feet activity   Assist Walk 50 feet with 2 turns activity did not occur: Safety/medical concerns(limited by dizziness, chest pain)  Assist level: Supervision/Verbal cueing Assistive  device: Walker-rolling    Walk 150 feet activity   Assist Walk 150 feet activity did not occur: Safety/medical concerns  Assist level: Supervision/Verbal cueing Assistive device: Walker-rolling    Walk 10 feet on uneven surface  activity   Assist Walk 10 feet on uneven surfaces activity did not occur: Safety/medical concerns(limited by dizziness, chest pain)   Assist level: Contact Guard/Touching assist Assistive device: Aeronautical engineer Will patient use wheelchair at discharge?: 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   Continue CIR  2.  Antithrombotics: -DVT/anticoagulation:  Pharmaceutical: Other (comment)--Eliquis             -antiplatelet therapy: N/A 3. Pain Management:  .   4/4- tolerating Norco for pain 4. Mood: LCSW to follow for evaluation and support.              -antipsychotic agents: N/A 5. Neuropsych: This patient appears to be capable of making decisions on his own behalf. 6. Sacral decubitus/Skin/Wound Care  Sacral ulcer  Continue air mattress overlay and foam dressing, unstageable  4/5- will reassess tomorrow before d/c    7. Fluids/Electrolytes/Nutrition: Monitor I/Os 8. T2DM with hyperglycemia: Hgb A1c- 11.7  Levemir bid with SSI   Consulted dietician to start bolus tube feeds.    CBG (last 3)  Recent Labs    04/19/19 1658 04/19/19 2054 04/20/19 0638  GLUCAP 117* 90 92   Novolog 3 units- will monitor  Decrease Levemir to 20 units BID   Labile, but?  Stabilizing.  Patient also  appears to be eating food from home  3/29- will switch to Glipizide and Metformin since has to pay out of pocket for meds  4/1- doing great with BGs- con't meds  4/5- BGs controlled- con't meds  Continue to monitor 9. Dizziness: Changed Cardura to bedtime to avoid SE. Will monitor orthostatic Bps  Improving  10. 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   Added Duonebs QID  3/30- decannulated- tolerating and doing great  4/2- healing well 12. Resting tachycardia: Due to deconditioning. No BB due to soft BP/orthostatic symptoms.  Monitor for now.    Vitals:   04/19/19 1943 04/20/19 0409  BP: (!) 151/98 (!) 130/93  Pulse: 89 89  Resp: 16 18  Temp: 98.7 F (37.1 C) 98.9 F (37.2 C)  SpO2: 97% 97%   Labile up to 145/99- but overall controlled 3/30  4/2- BP 156/133- will add metoprolol 12.5 mg BID to help with elevated Bps- monitor closely  4/5- BP's a little soft, but asymptomatic and pulse somewhat better 13. Gangrenous changes bilateral feet: off loading shoes.  3/29- will call  Vascular to see toes- hasn't been seen by vascular yet.   4/2- pt refused amputaiton- discussed my concerns and what it means to "autoamputate"- via interpretor- pt felt like he understands.  4/5- will con't "autoamputation"   Continue to monitor.  14. Abnormal LFTs:   3/10- mild elevation of ALT only- will con't to monitor 15. ABLA: Due to critical illness.   4/1- Hb up to 12k-con't regimen 16. Right Vocal fold immobility, dysphonia:s/p transnasal fiberoptic laryngoscopy by ENT on 3/9 but was aborted due to gagging and vomiting.   3/23- injected vocal folds with Prolaryn R>L and kenalog on R due to being tethetered  4/1- talking well- can understand and on D3 thins now 17.  Labile blood pressure  Monitor for trend  3/31- controlled overall  4/3: metoprolol added yesterday. Some improvement--observe for pattern today  18. PEG  3/30- placed 2/24- so have to wait to  pull  3/31- d/w pt  4/1- needs to be pulled 4/7- d/c 4/6- will check with GI if can pull on d/c. Will have IR pull it.   4/5- wrote for referral to have IR pull it. No note from IR as of yet. Did place referral 59. Hypokalemia  4/5- will replete 40 mEq x2 and then put on 10 mEq daily.  20. Dispo  4/6- d/c today- needs f/u with me; will need wife to do wound care on backside.   LOS: 28 days A FACE TO FACE EVALUATION WAS PERFORMED  Elis Sauber 04/20/2019, 9:50 AM

## 2019-04-20 NOTE — Consult Note (Signed)
WOC Nurse wound follow up Patient receiving care in [facility] 4M13. Wound type: healing stage 3 PI to coccyx Measurement: 3.8 cm x 1.8 cm x 0.8 cm Wound bed: 100% pink, granulation tissue filling in defect Drainage (amount, consistency, odor) none Periwound: intact Dressing procedure/placement/frequency: Continue current therapy. Monitor the wound area(s) for worsening of condition such as: Signs/symptoms of infection,  Increase in size,  Development of or worsening of odor, Development of pain, or increased pain at the affected locations.  Notify the medical team if any of these develop. Helmut Muster, RN, MSN, CWOCN, CNS-BC, pager [phone]

## 2019-04-20 NOTE — Discharge Summary (Signed)
Physician Discharge Summary  Patient ID: Andrew Bruce MRN: [id number] DOB/AGE: [birth year] 54 y.o.  Admit date: 03/23/2019 Discharge date: 04/20/2019  Discharge Diagnoses:  Principal Problem:   Embolic stroke [facility]) Active Problems:   Acute respiratory failure with hypoxia (HCC)   Pressure injury of skin   Status post tracheostomy ([place])   Leukocytosis   Acute blood loss anemia   Uncontrolled type 2 diabetes mellitus with hyperglycemia (HCC)   Dry gangrene ([place])   Discharged Condition: Improved.   Significant Diagnostic Studies: DG CHEST PORT 1 VIEW  Result Date: 03/24/2019 CLINICAL DATA:  Shortness of breath EXAM: PORTABLE CHEST 1 VIEW COMPARISON:  03/13/2019 FINDINGS: Cardiac shadow is stable. Right-sided PICC line is noted and stable. Tracheostomy tube is again seen and stable. Patchy infiltrates are identified bilaterally slightly increased when compared with the prior exam. No pneumothorax or sizable effusion is seen. IMPRESSION: Slight increase in patchy opacities bilaterally. Tubes and lines as described. Electronically Signed   By: Inez Catalina M.D.   On: 03/24/2019 19:18   IR GASTROSTOMY TUBE REMOVAL/REPAIR  Result Date: 04/20/2019 INDICATION: Patient with history of subacute infarct with a dysphagia presents for gastrostomy tube removal no longer needed. EXAM: IR GASTROSTOMY TUBE REMOVAL MEDICATIONS: Viscous lidocaine 10 mL; Antibiotics for not indicated for this procedure. ANESTHESIA/SEDATION: None. CONTRAST:  Not indicated- FLUOROSCOPY TIME:  None COMPLICATIONS: None immediate. PROCEDURE: Informed written consent was obtained from the patient after a thorough discussion of the procedural risks, benefits and alternatives. All questions were addressed. Maximal Sterile Barrier Technique was utilized including caps, mask, sterile gowns, sterile gloves, sterile drape, hand hygiene and skin antiseptic. A timeout was performed prior to the initiation of the procedure. Viscous lidocaine  was applied to the gastrostomy tube exit site approximately 10 minutes prior to procedure. The gastrostomy tube was removed using gentle traction. A gastrostomy tube removed intact. IMPRESSION: Successful removal 20 French pull-through gastrostomy tube intact. Read by Rushie Nyhan NP Electronically Signed   By: Lucrezia Europe M.D.   On: 04/20/2019 12:26    Labs:  Basic Metabolic Panel: BMP Latest Ref Rng & Units 04/19/2019 04/15/2019 04/12/2019  Glucose 70 - 99 mg/dL 112(H) 109(H) 82  BUN 6 - 20 mg/dL 14 10 14   Creatinine 0.61 - 1.24 mg/dL 0.38(L) 0.44(L) 0.40(L)  Sodium 135 - 145 mmol/L 139 138 140  Potassium 3.5 - 5.1 mmol/L 3.2(L) 3.9 3.3(L)  Chloride 98 - 111 mmol/L 103 103 102  CO2 22 - 32 mmol/L 25 24 26   Calcium 8.9 - 10.3 mg/dL 9.0 9.3 9.3    CBC: CBC Latest Ref Rng & Units 04/19/2019 04/15/2019 04/12/2019  WBC 4.0 - 10.5 K/uL 7.3 7.5 7.5  Hemoglobin 13.0 - 17.0 g/dL 11.8(L) 12.0(L) 12.1(L)  Hematocrit 39.0 - 52.0 % 38.3(L) 38.6(L) 38.7(L)  Platelets 150 - 400 K/uL 387 476(H) 503(H)    CBG: Recent Labs  Lab 04/19/19 1149 04/19/19 1658 04/19/19 2054 04/20/19 0638 04/20/19 1159  GLUCAP 139* 117* 90 92 91    Brief HPI:   Andrew Bruce is a 54 y.o. male RH Hispanic male who was originally admitted on 02/06/19 with progressive SOB due to Covid 19 infection. He was treated for ARDS with intubation and required bilateral chest tubes due to pneumothoraces. As sedation weaned, he was found to have decrease in LOC with right sided weakness and CT head showed large area of subacute infarct in left occipital lobe and additional smaller area in right frontal convexity.  2D echo showed EF of 55 to  60% with reduced systolic right ventricle function consistent with acute cor pulmonale.  CT angio revealed LLE segmental pulmonary artery embolus and he was started on treatment dose Lovenox and transition to Eliquis.  He continued to be encephalopathic and follow-up MRI brain showed subacute  infarcts in bilateral frontoparietal regions, splenium of corpus callosum on right and most significantly left occipital lobe.  Neurology felt stroke was embolic in setting of Covid related hypercoagulopathy versus vasculitis.  He did develop gangrenous changes of toes bilateral feet due to embolism.  He required tracheostomy as well as PEG placement as family elected on full scope of care.  He was extubated to ATC but continue to have copious secretions.  Unstageable sacral decub treated with hydrotherapy.  He was kept NPO due to severe dysphagia and FEES done on day of discharge showing fixed partially adducted right arytenoid with no movement and slightly more movement of left arytenoid.  ST felt patient was at severe risk for asphyxiation with swallowing activity and ENT was consulted for input.  Patient was started on Levemir for elevated blood sugars due to new diagnosis of diabetes.  Therapy was ongoing and he was noted to have issues with orthostatic symptoms, significant visual deficits and severe debility.  CIR was recommended due to functional decline.    Hospital Course: Daveon Arpino was admitted to rehab 03/23/2019 for inpatient therapies to consist of PT, ST and OT at least three hours five days a week. Past admission physiatrist, therapy team and rehab RN have worked together to provide customized collaborative inpatient rehab. Blood pressures were monitored on TID basis and cardura was changed to bedtime to avoid  orthostatic symptoms. Hypotension has resolved and resting tachycardia has improved with improvement in activity tolerance. Serial labs have shown hypokalemia which has been supplemented on prn basis. Last check showed recurrent drop in potassium therefore he was started on daily supplement at discharge. Diabetes has been monitored with ac/hs CBG checks and SSI was use prn for tighter BS control.  Due to concerns of finances, Levemir was changed to oral hypoglycemics. Metformin and  glipizide added and titrated with improvement in BS. Patient and family have been educated on CM diet and option of purchasing glucometer or following up with clinic for further assistance.   Transnasal laryngoscopy revealed that right vocal fold was immobile and left VC had very little mobility and Dr. Redmond Baseman felt that this was likely consequence of intubation. Patient was taken to OR suspended microdirect laryngoscopy with Prolaryn and kenalog injections on 03/23.  MBS done to evaluate swallow function next day and patient was started on modified diet and has been advanced to dysphagia 3, thin liquids.  He is tolerating this without difficulty and PEG tube was removed on 04/06 prior to discharge by IR.  Protein supplements and vitamins were added to help promote wound healing and due to protein calorie malnutrition. His sacral decub was treated with hydrotherapy followed by santyl with wet to dry dressing till wound bed was clear of white/yellow slough then transitioned to santyl with wet to dry dressing changes. The size of bottom/sacral wound 3.8x1.8 x 0.8cm and is granulating in well.  Family educated on continuing daily dressing changes after discharge.   He continues on Eliquis for embolic stroke and is tolerating this without SE. Serial CBC showed that H/H is stable and reactive thrombocytosis has resolved. Bilateral Darco off loading shoes were ordered with recommendation to WB on heel due to gangrenous toes. Multiple toes with dry gangrene  have been monitored and he reported foot pain with increase in activity.  Dr. Carlis Abbott was consulted for input on gangrenous toes and felt that toes were demarcating well without signs of infection and right toe amputation and left TMA was discussed but patient declined and will  follow up on outpatient basis for continued monitoring.  He is continent of bowel and bladder.  He  has made gains during rehab stay and is at modified independent level.  He will continue to  receive follow up outpatient PT, OT and ST at [facility] after discharge   Rehab course: During patient's stay in rehab weekly team conferences were held to monitor patient's progress, set goals and discuss barriers to discharge. At admission, patient required total assist with ADL tasks and with mobility. He exhibited dysphonia with poor awareness of deficits, impaired memory and impaired problem solving.  He  has had improvement in activity tolerance, balance, postural control as well as ability to compensate for deficits. He has had improvement in functional use RUE and RLE as well as improvement in awareness. He is able to complete ADL tasks with supervision.  He requires supervision for transfers and to ambulate 76' with RW and cues.  He is tolerating mechanical soft diet without s/s of aspiration and cognition has improved to baseline. IMST/EMST was used to help with improvement in vocal intensity--this has been held since decannulation to allow for stoma to heal.     Discharge disposition: 01-Home or Self Care  Diet: Diabetic diet   Wound Care: Cleanse area on buttock with normal saline/water. Apply a layer of santyl then cover with damp to dry dressing. Change daily and if soiled.   Special Instructions: 1.Continue pressure relief measures.  2. Recommend repeat BMET in a couple of weeks to monitor potassium levels.    Discharge Instructions    Ambulatory referral to Physical Medicine Rehab   Complete by: As directed    1-2 weeks TC appointment   Diet - low sodium heart healthy   Complete by: As directed    Discharge instructions   Complete by: As directed    Resume previous orders.     Allergies as of 04/20/2019   No Known Allergies     Medication List    STOP taking these medications   feeding supplement (OSMOLITE 1.5 CAL) Liqd   folic acid 1 MG tablet Commonly known as: FOLVITE   insulin detemir 100 UNIT/ML injection Commonly known as: LEVEMIR   multivitamin  Liqd   nutrition supplement (JUVEN) Pack   thiamine 100 MG tablet     TAKE these medications   acetaminophen 325 MG tablet Commonly known as: TYLENOL Take 1-2 tablets (325-650 mg total) by mouth every 4 (four) hours as needed for mild pain.   apixaban 5 MG Tabs tablet Commonly known as: ELIQUIS Take 1 tablet (5 mg total) by mouth 2 (two) times daily. What changed: how to take this   ascorbic acid 500 MG tablet Commonly known as: VITAMIN C Take 1 tablet (500 mg total) by mouth daily.   atorvastatin 20 MG tablet Commonly known as: LIPITOR Take 1 tablet (20 mg total) by mouth daily at 6 PM. What changed: how to take this   blood glucose meter kit and supplies Kit Dispense based on patient and insurance preference. Use up to four times daily as directed. (FOR ICD-9 250.00, 250.01).   collagenase ointment Commonly known as: SANTYL Apply topically daily. To sacrum and cover with damp to  dry dressing   doxazosin 1 MG tablet Commonly known as: CARDURA Take 1 tablet (1 mg total) by mouth at bedtime. What changed:   how to take this  when to take this   famotidine 20 MG tablet Commonly known as: PEPCID Take 1 tablet (20 mg total) by mouth 2 (two) times daily. What changed: how to take this   feeding supplement (PRO-STAT SUGAR FREE 64) Liqd Take 30 mLs by mouth 3 (three) times daily. What changed: how to take this   glipiZIDE 5 MG tablet Commonly known as: GLUCOTROL Take 0.5 tablets (2.5 mg total) by mouth daily before breakfast.   guaiFENesin 100 MG/5ML Soln Commonly known as: ROBITUSSIN Take 10 mLs (200 mg total) by mouth every 6 (six) hours as needed for cough or to loosen phlegm. Notes to patient: Over the counter for cough/congestion   hydroxypropyl methylcellulose / hypromellose 2.5 % ophthalmic solution Commonly known as: ISOPTO TEARS / GONIOVISC Place 1 drop into both eyes 4 (four) times daily as needed for dry eyes.   iron polysaccharides 150 MG  capsule Commonly known as: NIFEREX Take 1 capsule (150 mg total) by mouth 2 (two) times daily.   metFORMIN 500 MG tablet Commonly known as: GLUCOPHAGE Take 1 tablet (500 mg total) by mouth 2 (two) times daily with a meal.   metoprolol tartrate 25 MG tablet Commonly known as: LOPRESSOR Take 0.5 tablets (12.5 mg total) by mouth 2 (two) times daily.   potassium chloride 10 MEQ tablet Commonly known as: KLOR-CON Take 1 tablet (10 mEq total) by mouth daily.   senna-docusate 8.6-50 MG tablet Commonly known as: Senokot-S Place 1 tablet into feeding tube at bedtime as needed for mild constipation. Notes to patient: Take for constipation--may stop if bowels are moving well   traZODone 50 MG tablet Commonly known as: DESYREL Take 0.5 tablets (25 mg total) by mouth at bedtime as needed for sleep.   zinc sulfate 220 (50 Zn) MG capsule Take 1 capsule (220 mg total) by mouth daily.      Follow-up Information    [name], Jinny Blossom, MD Follow up.   Specialty: Physical Medicine and Rehabilitation Why: Office will Call with appointment Contact information: [address] [place] [address] [phone]        Marty Heck, MD. Call on 05/18/2019.   Specialty: Vascular Surgery Why: Appointment at 2:20 pm--follow up on toes Contact information: [place] [address] [phone]        Melida Quitter, MD. Call.   Specialty: Otolaryngology Why: for follow up on voice/vocal cord weakness Contact information: [address] [place] [address] [phone]        Vevelyn Francois, NP Follow up on 05/13/2019.   Specialty: Adult Health Nurse Practitioner Why: Appointment at 9:20 am Contact information: [address] [place] [address] [phone]           Signed: Bary Leriche 04/22/2019, 9:22 AM

## 2019-04-20 NOTE — Progress Notes (Signed)
Patient discharged home with wife and daughter. Discharge instructions given by Delle Reining PA. All questions answered. Patient discharged at 10 with all belongings and supplies.

## 2019-04-20 NOTE — Progress Notes (Signed)
Supplies provided to change dressing to abdomen and stoma site prn. Patient and wife instructed on how to change dressings. Translator at bedside. All questions answered.

## 2019-04-20 NOTE — Plan of Care (Signed)
  Problem: Consults Goal: RH GENERAL PATIENT EDUCATION Description: See Patient Education module for education specifics. Outcome: Completed/Met Goal: Skin Care Protocol Initiated - if Braden Score 18 or less Description: If consults are not indicated, leave blank or document N/A Outcome: Completed/Met Goal: Nutrition Consult-if indicated Outcome: Completed/Met Goal: Diabetes Guidelines if Diabetic/Glucose > 140 Description: If diabetic or lab glucose is > 140 mg/dl - Initiate Diabetes/Hyperglycemia Guidelines & Document Interventions  Outcome: Completed/Met   Problem: RH BOWEL ELIMINATION Goal: RH STG MANAGE BOWEL WITH ASSISTANCE Description: STG Manage Bowel with mod I Assistance. Outcome: Completed/Met   Problem: RH BLADDER ELIMINATION Goal: RH STG MANAGE BLADDER WITH ASSISTANCE Description: STG Manage Bladder With mod I Assistance Outcome: Completed/Met   Problem: RH SKIN INTEGRITY Goal: RH STG SKIN FREE OF INFECTION/BREAKDOWN Description: No new areas of break down while on CIR Outcome: Completed/Met Goal: RH STG MAINTAIN SKIN INTEGRITY WITH ASSISTANCE Description: STG Maintain Skin Integrity With mod I Assistance. Outcome: Completed/Met   Problem: RH SAFETY Goal: RH STG ADHERE TO SAFETY PRECAUTIONS W/ASSISTANCE/DEVICE Description: STG Adhere to Safety Precautions With mod I Assistance/Device. Outcome: Completed/Met   Problem: RH PAIN MANAGEMENT Goal: RH STG PAIN MANAGED AT OR BELOW PT'S PAIN GOAL Description: Pain goal of <3/10 Outcome: Completed/Met   Problem: RH KNOWLEDGE DEFICIT GENERAL Goal: RH STG INCREASE KNOWLEDGE OF SELF CARE AFTER HOSPITALIZATION Outcome: Completed/Met

## 2019-04-20 NOTE — Progress Notes (Signed)
Patient wife and translator at bedside. Wife reported has changed sacral dressing before and comfortable with doing at home. Wife given santyl cream and additional supplies.

## 2019-04-20 NOTE — Progress Notes (Signed)
Social Work Discharge Note   The overall goal for the admission was met for:   Discharge location: Yes. D/c to home with his wife.   Length of Stay: Yes. 28 days.  Discharge activity level: Yes. Mod I.     Home/community participation: Yes. Limited.  Services provided included: MD, RD, PT, OT, SLP, RN, CM, TR, Pharmacy, Neuropsych and SW  Financial Services: Other: Uninsured  Follow-up services arranged: Outpatient: [facility] for PT/OT/ST #[phone]; DME: [facility] [phone]  Comments (or additional information): Contact pt wife Juliene Pina [phone] (Spanish interpreter required) or pt dtr [name] [phone]  New pt appt: [place];Thursday, March 29 at [facility] with Dionisio David NP; [phone]  Patient/Family verbalized understanding of follow-up arrangements: Yes  Individual responsible for coordination of the follow-up plan: Pt will have assistance from his wife, dtr, and various family members to aide with care needs.   Confirmed correct DME delivered: Rana Snare 04/20/2019    Rana Snare

## 2019-04-20 NOTE — Procedures (Signed)
Pre procedural Dx: dysphagia Post procedural Dx: dysphagia  Successful removal of tunneled 20 Fr pull through gastrostomy tube. Catheter removed intact   EBL: None No immediate complications.  Please see imaging section of Epic for full dictation.  Marletta Lor NP 04/20/2019 11:55 AM

## 2019-04-22 ENCOUNTER — Encounter (HOSPITAL_COMMUNITY): Payer: Self-pay | Admitting: Physical Medicine and Rehabilitation

## 2019-04-22 ENCOUNTER — Telehealth: Payer: Self-pay | Admitting: Registered Nurse

## 2019-04-22 NOTE — Telephone Encounter (Signed)
Transitional Care call Transitional Questions asked via [facility] with Myra  Patient name: Andrew Bruce  DOB: 1965-03-14 1. Are you/is patient experiencing any problems since coming home? No a. Are there any questions regarding any aspect of care? No 2. Are there any questions regarding medications administration/dosing? No a. Are meds being taken as prescribed? YES b. "Patient should review meds with caller to confirm"  3. Have there been any falls? nO 4. Has Home Health been to the house and/or have they contacted you? Has appointments  With [facility]  a. If not, have you tried to contact them? NA b. Can we help you contact them? No 5. Are bowels and bladder emptying properly? Yes a. Are there any unexpected incontinence issues? No b. If applicable, is patient following bowel/bladder programs? NA 6. Any fevers, problems with breathing, unexpected pain? No 7. Are there any skin problems or new areas of breakdown? He has a sacral decubitus and family is changing the dressing,  8. Has the patient/family member arranged specialty MD follow up (ie cardiology/neurology/renal/surgical/etc.)?  [name] asked for assistance with calling [name] office, this provider will call Dr. Jenne Pane office and asked if the will call her with [facility]. She verbalizes understanding. Placed a call to [name] office obtain an appointment and will call [name] tomorrow 04/22/2019.  a. Can we help arrange? Yes, see above. 9. Does the patient need any other services or support that we can help arrange? No 10. Are caregivers following through as expected in assisting the patient? Yes 11. Has the patient quit smoking, drinking alcohol, or using drugs as recommended? (                        )  Appointment date/time 04/30/2019  arrival time 10:00 for 10:20 appointment with [name]. At [address] [employer] [facility] [address]

## 2019-04-23 ENCOUNTER — Telehealth: Payer: Self-pay | Admitting: Registered Nurse

## 2019-04-23 NOTE — Telephone Encounter (Signed)
This Provider placed a call to the [facility]  And spoke with [name]. Left message on her voicemail for Dr. Jenne Pane appointment:  04/29/2019 to arrive at 1:30 for 2:00 appointment. She was given [name] address and phone number. Her children speaks [place], this was verified with the translator from [facility]. She verbalizes understanding.

## 2019-04-29 DIAGNOSIS — J3801 Paralysis of vocal cords and larynx, unilateral: Secondary | ICD-10-CM | POA: Insufficient documentation

## 2019-04-29 DIAGNOSIS — J386 Stenosis of larynx: Secondary | ICD-10-CM | POA: Insufficient documentation

## 2019-04-30 ENCOUNTER — Other Ambulatory Visit: Payer: Self-pay

## 2019-04-30 ENCOUNTER — Encounter
Payer: Medicaid Other | Attending: Physical Medicine and Rehabilitation | Admitting: Physical Medicine and Rehabilitation

## 2019-04-30 ENCOUNTER — Telehealth: Payer: Self-pay | Admitting: *Deleted

## 2019-04-30 ENCOUNTER — Encounter: Payer: Self-pay | Admitting: Physical Medicine and Rehabilitation

## 2019-04-30 VITALS — BP 120/84 | HR 112 | Temp 97.7°F | Ht 66.0 in | Wt 157.0 lb

## 2019-04-30 DIAGNOSIS — G8191 Hemiplegia, unspecified affecting right dominant side: Secondary | ICD-10-CM | POA: Insufficient documentation

## 2019-04-30 DIAGNOSIS — R000 Tachycardia, unspecified: Secondary | ICD-10-CM | POA: Insufficient documentation

## 2019-04-30 DIAGNOSIS — Z9989 Dependence on other enabling machines and devices: Secondary | ICD-10-CM | POA: Diagnosis not present

## 2019-04-30 DIAGNOSIS — G8111 Spastic hemiplegia affecting right dominant side: Secondary | ICD-10-CM | POA: Insufficient documentation

## 2019-04-30 DIAGNOSIS — I6340 Cerebral infarction due to embolism of unspecified cerebral artery: Secondary | ICD-10-CM | POA: Diagnosis not present

## 2019-04-30 DIAGNOSIS — E1165 Type 2 diabetes mellitus with hyperglycemia: Secondary | ICD-10-CM | POA: Insufficient documentation

## 2019-04-30 DIAGNOSIS — I96 Gangrene, not elsewhere classified: Secondary | ICD-10-CM | POA: Insufficient documentation

## 2019-04-30 MED ORDER — TRAMADOL HCL 50 MG PO TABS: 50.0000 mg | ORAL_TABLET | Freq: Four times a day (QID) | ORAL | 0 refills | Status: DC | PRN

## 2019-04-30 NOTE — Telephone Encounter (Signed)
He has had Norco/Hydrocodone, so usually when I'm titrating someone DOWN off pain meds,  it's the next step and it has counted with almost every other pharmacy I send Rx's to.

## 2019-04-30 NOTE — Patient Instructions (Signed)
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  Here for hospital f/u.  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     1. Use tennis balls on rolling walker to make it easier to walk with it  2. Change Hydrocodone to Tramadol 1 tab q6 hours as needed OR 2 tabs 2x/day as needed for pain.   3. Tachycardia- won't increase Metoprolol because BP 120/84=- don't feel comfortable even though pulse 112.   4. Continue Apixiban- ask Neurology on 4/26 about if needs to continue past 1 more month.   5. F/U with PC- 4/29- as scheduled- don't miss appointment- should get refills from PCP for Diabetes, BP, etc.    6. F/U 8 weeks- make new PCP- new doctor 4/29 LOOK at toes!

## 2019-04-30 NOTE — Progress Notes (Signed)
Subjective:    Patient ID: Andrew Bruce, male    DOB: 03/29/1965, 54 y.o.   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  Here for hospital f/u.   5/10 pain on pain scale- the only place he hurts is on toes with some numbness associated.    Weakness on R side- doing OK/better.     PEG came out OK. Trach site healed- just a scab over the site now/scarring.    Today is first day came outside his house.  Hasn't walked outside house much- walked for 25 minutes- went well.   No falls since been home.   Wound on sacrum almost closed and only hurts occasionally.     Pain Inventory Average Pain 5 Pain Right Now 5 My pain is intermittent, burning and tingling  In the last 24 hours, has pain interfered with the following? General activity 0 Relation with others 0 Enjoyment of life 0 What TIME of day is your pain at its worst? night Sleep (in general) Good  Pain is worse with: walking Pain improves with: nothing Relief from Meds: 0  Mobility walk with assistance use a walker how many minutes can you walk? 45 ability to climb steps?  yes do you drive?  no Do you have any goals in this area?  yes  Function not employed: date last employed .  Neuro/Psych numbness tremor tingling trouble walking spasms  Prior Studies transitional  Physicians involved in your care transitional   Family History  Problem Relation Age of Onset  . Healthy Sister   . Healthy Brother    Social History   Socioeconomic History  . Marital status: Married    Spouse name: Not on file  . Number of children: Not on file  . Years of education: Not on file  . Highest education level: Not on file  Occupational History  . Not on file  Tobacco Use  . Smoking status: Never Smoker  . Smokeless tobacco: Never Used  Substance and Sexual Activity  . Alcohol use: Yes  .  Drug use: Never  . Sexual activity: Not on file  Other Topics Concern  . Not on file  Social History Narrative  . Not on file   Social Determinants of Health   Financial Resource Strain:   . Difficulty of Paying Living Expenses:   Food Insecurity:   . Worried About Programme researcher, broadcasting/film/video in the Last Year:   . Barista in the Last Year:   Transportation Needs:   . Freight forwarder (Medical):   Marland Kitchen Lack of Transportation (Non-Medical):   Physical Activity:   . Days of Exercise per Week:   . Minutes of Exercise per Session:   Stress:   . Feeling of Stress :   Social Connections:   . Frequency of Communication with Friends and Family:   . Frequency of Social Gatherings with Friends and Family:   . Attends Religious Services:   . Active Member of Clubs or Organizations:   . Attends Banker Meetings:   Marland Kitchen Marital Status:    Past Surgical History:  Procedure Laterality Date  . DIRECT LARYNGOSCOPY Bilateral 04/06/2019   Procedure: MICRO DIRECT LARYNGOSCOPY WITH PROLARYN INJECTION;  Surgeon: Christia Reading, MD;  Location: [facility] OR;  Service: ENT;  Laterality: Bilateral;  . IR GASTROSTOMY TUBE MOD  SED  03/10/2019  . IR GASTROSTOMY TUBE REMOVAL  04/20/2019   Past Medical History:  Diagnosis Date  . COVID-19 02/06/2019   BP 120/84   Pulse (!) 112   Temp 97.7 F (36.5 C)   Ht 5\' 6"  (1.676 m)   Wt 157 lb (71.2 kg)   SpO2 94%   BMI 25.34 kg/m   Opioid Risk Score:   Fall Risk Score:  `1  Depression screen PHQ 2/9  No flowsheet data found.  Review of Systems  Constitutional: Negative.   HENT: Negative.   Eyes: Negative.   Respiratory: Negative.   Cardiovascular: Negative.   Gastrointestinal: Negative.   Endocrine: Negative.   Genitourinary: Negative.   Musculoskeletal: Positive for gait problem.       Spasms   Skin: Negative.   Allergic/Immunologic: Negative.   Neurological: Positive for tremors and numbness.       Tingling  Hematological: Negative.    Psychiatric/Behavioral: Negative.   All other systems reviewed and are negative.      Objective:   Physical Exam Pulse 112  Awake, alert, appropriate, accompanied by interpretor, using RW, NAD MS: RUE- delt 4+/5, biceps 4+/5, triceps 5-/5, WE 5-/5, grip 5-/5, finger abd 4-/5 LUE- 5/5 in same muscles tested except finger abd 5-/5 RLE- HF- 4/5, KE 4+/5, KF 4-/5, DF 4-/5, PF 4/5 LLE- HF 5-/5, KE 5-/5, KF 4+/5, DF 4/5, PF 4+/5  Wearing healing shoes on feet B/L   Neuro: Facial sensation is equal RUE same as LUE except B/L 5th digits are numb- NOT any part of 4th digit- so not ulnar neuropathy  Skin: Dry skin on heels R 4tg and 5th toes looking better=more pink as well as proximal aspect of 1st toe L 1st toe has some ?tiny ?purulence around nail bed- but otherwise, no change- 2nd,4th and 5th toes more pink        Assessment & Plan:  Patient is a 54 yr old male with 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  Here for hospital f/u.  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     1. Use tennis balls on rolling walker to make it easier to walk with it  2. Change Hydrocodone to Tramadol 1 tab q6 hours as needed OR 2 tabs 2x/day as needed for pain.   3. Tachycardia- won't increase Metoprolol because BP 120/84=- don't feel comfortable even though pulse 112.   4. Continue Apixiban- ask Neurology on 4/26 about if needs to continue past 1 more month.   5. F/U with PC- 4/29- as scheduled- don't miss appointment- should get refills from PCP for Diabetes, BP, etc.    6. F/U 8 weeks- discussed autoamputation as risk for L great toe as well. If needs refill of pain meds, will need opiate contract and drug screen at that time   I spent a total of 30 minutes on appointment- more than 20 minutes discussing options for overall care, financial assistance, etc.

## 2019-04-30 NOTE — Telephone Encounter (Signed)
[facility] called and they said that can't see that Andrew Bruce has had any tramadol before, so they will have to dispense 7 days , unless you know something that they do not.  Please advise.

## 2019-05-03 NOTE — Telephone Encounter (Signed)
Per PMP they filled the Tramadol #120.

## 2019-05-10 ENCOUNTER — Other Ambulatory Visit: Payer: Self-pay

## 2019-05-10 ENCOUNTER — Ambulatory Visit: Payer: Medicaid Other | Attending: Physical Medicine and Rehabilitation

## 2019-05-10 ENCOUNTER — Ambulatory Visit: Payer: Medicaid Other | Admitting: Occupational Therapy

## 2019-05-10 DIAGNOSIS — R278 Other lack of coordination: Secondary | ICD-10-CM | POA: Insufficient documentation

## 2019-05-10 DIAGNOSIS — R2681 Unsteadiness on feet: Secondary | ICD-10-CM

## 2019-05-10 DIAGNOSIS — R208 Other disturbances of skin sensation: Secondary | ICD-10-CM

## 2019-05-10 DIAGNOSIS — R209 Unspecified disturbances of skin sensation: Secondary | ICD-10-CM | POA: Insufficient documentation

## 2019-05-10 DIAGNOSIS — R2689 Other abnormalities of gait and mobility: Secondary | ICD-10-CM | POA: Diagnosis present

## 2019-05-10 DIAGNOSIS — M6281 Muscle weakness (generalized): Secondary | ICD-10-CM | POA: Insufficient documentation

## 2019-05-10 DIAGNOSIS — R490 Dysphonia: Secondary | ICD-10-CM | POA: Insufficient documentation

## 2019-05-10 NOTE — Therapy (Signed)
[facility] [address] [place], [place], [address] Phone: [phone]   Fax:  [phone]  Physical Therapy Evaluation  Patient Details  Name: Andrew Bruce MRN: [id number] Date of Birth: [birth year] Referring Provider (PT): Delle Reining, [place]   Encounter Date: 05/10/2019  PT End of Session - 05/10/19 0926    Visit Number  1    Number of Visits  9    Date for PT Re-Evaluation  07/05/19    Authorization Type  Eval: 05/10/19 (medicaid)    Progress Note Due on Visit  8    PT Start Time  0815    PT Stop Time  0845    PT Time Calculation (min)  30 min    Equipment Utilized During Treatment  Gait belt    Activity Tolerance  Patient tolerated treatment well    Behavior During Therapy  [facility] for tasks assessed/performed       Past Medical History:  Diagnosis Date  . COVID-19 02/06/2019    Past Surgical History:  Procedure Laterality Date  . DIRECT LARYNGOSCOPY Bilateral 04/06/2019   Procedure: MICRO DIRECT LARYNGOSCOPY WITH PROLARYN INJECTION;  Surgeon: Christia Reading, MD;  Location: [facility] OR;  Service: ENT;  Laterality: Bilateral;  . IR GASTROSTOMY TUBE MOD SED  03/10/2019  . IR GASTROSTOMY TUBE REMOVAL  04/20/2019    There were no vitals filed for this visit.   Subjective Assessment - 05/10/19 0903    Subjective  Pt reports of being hospitalized due to Covid related symptoms of diffiulty to breath in 01/26/19.He was intubated on 02/07/19 and trach'd 03/02/19. ICU stay was complicated by PE and multiple CVAs. Pt also developed gangerene of bil toes during the inpatient stay. Currently patient has been home since 04/20/19. Has been doing exercises at home and trying to walk at home with walker. Wife assists with him with shower but he is able to dress himself.    Patient is accompained by:  Family member;Interpreter    Pertinent History  COVID in patient stay, hx of CVA, hx of PE, gangerene of toes    Limitations   Standing;Walking;House hold activities    How long can you stand comfortably?  5-10 min    How long can you walk comfortably?  inside home only    Patient Stated Goals  get stronger    Currently in Pain?  Yes    Pain Score  1     Pain Location  Toe (Comment which one)    Pain Orientation  Right;Left    Pain Descriptors / Indicators  Aching;Numbness    Pain Type  Chronic pain    Pain Onset  More than a month ago    Pain Frequency  Intermittent         [facility] PT Assessment - 05/10/19 0906      Assessment   Medical Diagnosis  CVA, deconditioning    Referring Provider (PT)  Delle Reining, PA-C    Onset Date/Surgical Date  01/26/19    Hand Dominance  Right    Prior Therapy  inpatient      Precautions   Precautions  Fall    Required Braces or Orthoses  Other Brace/Splint    Other Brace/Splint  Darco shoe      Restrictions   Weight Bearing Restrictions  No      Balance Screen   Has the patient fallen in the past 6 months  No    Has the patient had a decrease  in activity level because of a fear of falling?   Yes    Is the patient reluctant to leave their home because of a fear of falling?   No      Home Environment   Living Environment  Private residence    Living Arrangements  Spouse/significant other;Children    Available Help at Discharge  Family    Type of Home  House    Home Access  Stairs to enter    Entrance Stairs-Number of Steps  2    Entrance Stairs-Rails  Left;Right;Can reach both    Home Layout  Two level    Alternate Level Stairs-Number of Steps  8    Alternate Level Stairs-Rails  Right    Home Equipment  Walker - 2 wheels;Tub bench      Prior Function   Level of Independence  Independent    Vocation  Full time employment    Museum/gallery exhibitions officerVocation Requirements  chef      Cognition   Overall Cognitive Status  Within Functional Limits for tasks assessed    Attention  Focused    Focused Attention  Appears intact    Memory  Appears intact    Awareness  Appears intact       Observation/Other Assessments   Observations  Gangerene of bil toes: R 1st, 2nd, 3rd and 5th toes; L 1st to 4th toes      Functional Tests   Functional tests  Sit to Stand      Sit to Stand   Comments  5x sit to stand: 16.40 seconds must use bil hands      ROM / Strength   AROM / PROM / Strength  Strength      Strength   Strength Assessment Site  Hip;Knee;Ankle    Right/Left Hip  Right;Left    Right Hip Flexion  4/5    Right Hip Extension  4/5    Right Hip ABduction  4/5    Right Hip ADduction  4/5    Left Hip Flexion  4/5    Left Hip Extension  4/5    Left Hip ABduction  4/5    Left Hip ADduction  4/5    Right/Left Knee  Right;Left    Right Knee Flexion  5/5    Right Knee Extension  5/5    Left Knee Flexion  5/5    Left Knee Extension  5/5    Right/Left Ankle  Right;Left    Right Ankle Dorsiflexion  5/5    Left Ankle Dorsiflexion  5/5      Bed Mobility   Bed Mobility  Rolling Right;Rolling Left    Rolling Right  Independent    Rolling Left  Independent    Supine to Sit  Independent    Sit to Supine  Independent      Transfers   Transfers  Sit to Stand;Stand to Sit    Sit to Stand  6: Modified independent (Device/Increase time)    Five time sit to stand comments   16.40    Stand to Sit  6: Modified independent (Device/Increase time)      Ambulation/Gait   Ambulation/Gait  Yes    Ambulation/Gait Assistance  6: Modified independent (Device/Increase time)    Ambulation Distance (Feet)  100 Feet    Assistive device  Rolling walker    Gait Pattern  Wide base of support;Poor foot clearance - left    Ambulation Surface  Level      Standardized  Balance Assessment   Standardized Balance Assessment  Berg Balance Test      Berg Balance Test   Sit to Stand  Able to stand  independently using hands    Standing Unsupported  Able to stand safely 2 minutes    Sitting with Back Unsupported but Feet Supported on Floor or Stool  Able to sit safely and securely 2 minutes     Stand to Sit  Sits safely with minimal use of hands    Transfers  Able to transfer safely, minor use of hands    Standing Unsupported with Eyes Closed  Able to stand 10 seconds safely    Standing Unsupported with Feet Together  Able to place feet together independently and stand 1 minute safely    From Standing, Reach Forward with Outstretched Arm  Can reach confidently >25 cm (10")    From Standing Position, Pick up Object from Floor  Able to pick up shoe, needs supervision    From Standing Position, Turn to Look Behind Over each Shoulder  Looks behind from both sides and weight shifts well    Turn 360 Degrees  Able to turn 360 degrees safely in 4 seconds or less    Standing Unsupported, Alternately Place Feet on Step/Stool  Able to stand independently and safely and complete 8 steps in 20 seconds    Standing Unsupported, One Foot in Front  Able to plae foot ahead of the other independently and hold 30 seconds    Standing on One Leg  Able to lift leg independently and hold 5-10 seconds    Total Score  52    Berg comment:  low fall risk                Objective measurements completed on examination: See above findings.   Treatment: Calf stretch: 5 x 30" R and L on 2" board Pt educated on benefits of walking, educated on how to gradually increase his walking endurance, using walker and making sure he walks with family member being prsent at his side, 2x/day           PT Education - 05/10/19 0847    Education Details  Pt eduacted on doing small walking outside with his walker and family and slowly building walking endurance.    Person(s) Educated  Patient;Spouse    Methods  Explanation    Comprehension  Verbalized understanding       PT Short Term Goals - 05/10/19 0916      PT SHORT TERM GOAL #1   Title  Patient will be able to ambulate 200 feet with cane or no AD for 200 feet to improve household ambulation    Baseline  2 ww walker    Time  3    Period  Weeks     Status  New    Target Date  05/31/19      PT SHORT TERM GOAL #2   Title  Pt will be able to perform 5 sit to stands without use of UE for support to improve functional strength    Baseline  must use bil hands for support    Time  3    Period  Weeks    Status  New    Target Date  05/31/19      PT SHORT TERM GOAL #3   Title  Patient will be able to go up and down curb and ramp with cane of no AD to improve comminty navigation  PT Long Term Goals - 05/10/19 7824      PT LONG TERM GOAL #1   Title  Pt will be able to ambulate with cane or lesser AD for >1500 feet to improve community ambulation.    Baseline  must use 2 ww walker    Time  8    Period  Weeks    Status  New    Target Date  07/05/19      PT LONG TERM GOAL #2   Title  Pt will be able to stand/walk for >1 hour to improve ability to go shopping    Baseline  5-10 min    Time  8    Period  Weeks    Status  New    Target Date  07/05/19      PT LONG TERM GOAL #3   Title  Pt will demo 56/56 on BBS to improve overall balance and reduce fall risk    Baseline  52/56    Time  8    Period  Weeks    Status  New    Target Date  07/05/19             Plan - 05/10/19 0920    Clinical Impression Statement  Patient is a 54 y.o. male who was seen today for gait and mobility disorder and deconditioning after lengthy inpatient stay and complications due to COVID. Patient currently has gangerene of bil toes and he is able to ambulate with orthotic shoe and use of walker for household ambulation. He requires min A with ADLs per subjective report. He demonstrates mild decrease in his strength of bil LE and decreased balance and proprioception. According to [employer] of 52/56, he demo low fall risk. Patient will benefit from skilled PT to gradually improve his strength, balance and endurance to improve overall function to return back to PLOF.    Personal Factors and Comorbidities  Comorbidity 2;Time since onset of  injury/illness/exacerbation;Transportation    Comorbidities  Hx of CVA, Gangerene of toes    Examination-Activity Limitations  Bathing;Hygiene/Grooming;Patent attorney for Others    Examination-Participation Restrictions  Community Activity;Driving;Laundry;Yard Work;Shop;Meal Prep    Stability/Clinical Decision Making  Evolving/Moderate complexity    Clinical Decision Making  Moderate    Rehab Potential  Good    PT Frequency  1x / week    PT Duration  8 weeks    PT Treatment/Interventions  ADLs/Self Care Home Management;Gait training;Stair training;Functional mobility training;Therapeutic exercise;Therapeutic activities;Balance training;Patient/family education;Orthotic Fit/Training;Passive range of motion;Manual techniques;Energy conservation;Visual/perceptual remediation/compensation;Joint Manipulations    PT Next Visit Plan  Review HEP, assess to see if they are trying to walk outside couple of times a day, Gait training with st. cane, ramp and curb and stairs, develop a  generalized HEP    PT Home Exercise Plan  Access Code: [email or web address]: [email or web address]: 04/26/2021Prepared by: Gwenyth Bouillon PatelExercisesStanding Gastroc Stretch on Foam 1/2 Roll - 3 x daily - 7 x weekly - 1 sets - 3 reps - 30 hold    Consulted and Agree with Plan of Care  Patient;Family member/caregiver    Family Member Consulted  Wife       Patient will benefit from skilled therapeutic intervention in order to improve the following deficits and impairments:  Abnormal gait, Decreased balance, Decreased mobility, Decreased endurance, Decreased range of motion, Decreased strength, Difficulty walking, Impaired sensation, Pain  Visit Diagnosis: Muscle weakness (generalized)  Other abnormalities of gait and mobility  Unsteadiness on feet  Problem List Patient Active Problem List   Diagnosis Date Noted  . Dependent on walker for ambulation 04/30/2019  . Right hemiparesis (HCC) 04/30/2019   . Tachycardia 04/30/2019  . Acute blood loss anemia   . Uncontrolled type 2 diabetes mellitus with hyperglycemia (HCC)   . Dry gangrene (HCC)   . Leukocytosis   . 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 Status post tracheostomy (HCC)   . Pressure injury of skin 03/02/2019  . On mechanically assisted ventilation (HCC)   . Goals of care, counseling/discussion   . Palliative care encounter   . ARDS (adult respiratory distress syndrome) (HCC)   . Cerebral embolism with cerebral infarction 02/17/2019  . Acute respiratory failure with hypoxia (HCC)   . Pneumothorax, right     Ileana Ladd 05/10/2019, 9:29 AM  [facility] [address] [place], [place], [address] Phone: [phone]   Fax:  [phone]  Name: Shaan Rhoads MRN: [id number] Date of Birth: [birth year]

## 2019-05-10 NOTE — Therapy (Signed)
[facility] [address] [place], [place], [address] Phone: [phone]   Fax:  [phone]  Occupational Therapy Evaluation  Patient Details  Name: Andrew Bruce MRN: [id number] Date of Birth: 19-Jun-1965 Referring Provider (OT): Reesa Chew PA-C   Encounter Date: 05/10/2019  OT End of Session - 05/10/19 1350    Visit Number  1    Number of Visits  17    Date for OT Re-Evaluation  07/10/19    Authorization Type  ? MCD pending - pt did NOT have MCD at time of evaluation    OT Start Time  0845    OT Stop Time  0930    OT Time Calculation (min)  45 min    Activity Tolerance  Patient tolerated treatment well    Behavior During Therapy  [facility] for tasks assessed/performed       Past Medical History:  Diagnosis Date  . COVID-19 02/06/2019    Past Surgical History:  Procedure Laterality Date  . DIRECT LARYNGOSCOPY Bilateral 04/06/2019   Procedure: MICRO DIRECT LARYNGOSCOPY WITH PROLARYN INJECTION;  Surgeon: Melida Quitter, MD;  Location: [place];  Service: ENT;  Laterality: Bilateral;  . IR GASTROSTOMY TUBE MOD SED  03/10/2019  . IR GASTROSTOMY TUBE REMOVAL  04/20/2019    There were no vitals filed for this visit.  Subjective Assessment - 05/10/19 0855    Patient is accompanied by:  Family member;Interpreter   wife   Pertinent History  CVA w/ mild Rt hemiparesis secondary to COVID and ARF Jan 2021. Pt intubated 02/07/19 and trachecd 0/73/71 - ICU complicated by PE and multiple CVA'S, gangrene of toes Lt 1-4 and Rt 1, 2, 3, 5.    Limitations  no driving, no heavy lifting    Patient Stated Goals  go back to work    Currently in Pain?  Yes   Toes bilaterally d/t gangrene - O.T. not directly addressing       [facility] OT Assessment - 05/10/19 0001      Assessment   Medical Diagnosis  CVA due to covid   Rt hemiparesis   Referring Provider (OT)  Reesa Chew PA-C    Onset Date/Surgical Date  02/04/19    Hand Dominance  Right     Prior Therapy  Acute rehab, inpatient rehab      Precautions   Precautions  Fall   flat hospital boots d/t gangrenous toes   Precaution Comments  no driving, no heavy lifting    Other Brace/Splint  Darco shoe      Home  Environment   Bathroom Shower/Tub  Walk-in Shower   grab bars and shower seat   Additional Comments  Pt lives with wife and 3 kids (ages 65, 80, and 35) in 2 story house with 2 steps to enter. Bedrooms on 2nd floor. Upstairs carpeted    Lives With  Family      Prior Function   Level of Independence  Independent    Vocation  Full time employment    Vocation Requirements  cook at [employer]    Leisure  yardwork      ADL   Eating/Feeding  Independent    Grooming  Moderate assistance   independent brushing teeth, dep for shaving   Upper Body Bathing  + 1 Total asssestance    Lower Body Bathing  + 1 Total assistance    Upper Body Dressing  Independent    Lower Body Dressing  Modified independent  no tie up shoes, slip on pants   Toilet Transfer  Independent    Toileting - Recruitment consultant -  Hygiene  Independent    Tub/Shower Transfer  Minimal assistance      IADL   Shopping  Completely unable to shop    Light Housekeeping  Does not participate in any housekeeping tasks    Meal Prep  Needs to have meals prepared and served    [employer] on family or friends for transportation      Mobility   Mobility Status  Needs assist    Mobility Status Comments  uses FWW      Written Expression   Dominant Hand  Right    Handwriting  90% legible   Big block letters, ? expressive aphasia     Vision - History   Baseline Vision  No visual deficits    Additional Comments  denies change      Activity Tolerance   Activity Tolerance  Tolerates < 10 min activity with changes in vital signs      Cognition   Cognition Comments  Pt had to be asked questions twice sometimes in own language      Observation/Other  Assessments   Observations  Pt appears to have RUE weakness however reports Lt LE weaker. Pt deconditoned bilaterally from COVID and prolonged hospitalization      Sensation   Additional Comments  Pt reports numbness index finger on Rt hand, ring and small fingers Lt hand      Coordination   9 Hole Peg Test  Right;Left    Right 9 Hole Peg Test  63.94 sec   using thumb and long finger d/t numbness Rt index finger   Left 9 Hole Peg Test  37.94 sec      Edema   Edema  none      ROM / Strength   AROM / PROM / Strength  AROM;Strength      AROM   Overall AROM Comments  RUE end range flexion (last 10%) limited, also noted decreased index composite flexion and thumb opposition to 4th digit, all other ROM WFL's       Strength   Overall Strength Comments  RUE MMT grossly 4/5 with flex and abd, 5/5 w/ IR/ER, LUE MMT grossly 5/5 (however endurance bilaterally limited)      Hand Function   Right Hand Grip (lbs)  20 LBS    Left Hand Grip (lbs)  38 LBS                        OT Short Term Goals - 05/10/19 1404      OT SHORT TERM GOAL #1   Title  Independent with HEP for RT hand strength and coordination    Baseline  dependent - not yet issued    Time  4    Period  Weeks    Status  New      OT SHORT TERM GOAL #2   Title  Pt to be supervision only for bathing while seated    Baseline  dependent    Time  4    Period  Weeks    Status  New      OT SHORT TERM GOAL #3   Title  Pt to perform 10 min of activity with only 1 rest break and no significant change in HR    Baseline  Pt gets  SOB and requires frequent rest breaks    Time  4    Period  Weeks    Status  New      OT SHORT TERM GOAL #4   Title  Pt to improve grip strength Rt hand to 30 lbs, and Lt hand to 45 lbs    Baseline  Rt = 20, Lt = 38 lbs    Time  4    Period  Weeks    Status  New      OT SHORT TERM GOAL #5   Title  Improve coordination as evidenced by performing 9 hole peg test in 50 sec or less Rt  dominant hand    Baseline  Rt = 63.94 sec    Time  4    Period  Weeks    Status  New        OT Long Term Goals - 05/10/19 1406      OT LONG TERM GOAL #1   Title  Independent with BUE HEP for strength and endurance    Baseline  dependent - not yet issued    Time  8    Period  Weeks    Status  New      OT LONG TERM GOAL #2   Title  Pt to return to simple cooking tasks for 15 min or greater at mod I level    Baseline  dependent    Time  8    Period  Weeks    Status  New      OT LONG TERM GOAL #3   Title  Pt to return to light household work (taking out trash, washing dishes, etc) mod I level without rest breaks    Baseline  dependent    Time  8    Period  Weeks    Status  New      OT LONG TERM GOAL #4   Title  Pt to improve grip strength RT dominant hand to 40 lbs or greater    Baseline  Rt = 20 lbs    Time  8    Period  Weeks    Status  New      OT LONG TERM GOAL #5   Title  Pt to improve coordination as evidenced by performing 9 hole peg test in 40 sec or less Rt hand    Baseline  63.94 sec    Time  8    Period  Weeks    Status  New            Plan - 05/10/19 1351    Clinical Impression Statement  Pt is a 54 y.o. male who presents to OPOT for evaluation s/p complex recent medical history including: Covid w/ ARF 02/04/19 with intubation on 02/07/19 and trach 03/02/19 (both removed) with consequent CVA and mild Rt hemiparesis, PE, gangrene of toes bilaterally. Pt generally deconditioned, decreased strength and coordination RUE, altered sensation, unsteadiness on feet and possible cognitive deficts. Pt also Spanish speaking. Pt would benefit from OT to address these deficits, increase overall endurance and safety, and maximize function to return to PLOF    OT Occupational Profile and History  Detailed Assessment- Review of Records and additional review of physical, cognitive, psychosocial history related to current functional performance    Occupational performance  deficits (Please refer to evaluation for details):  ADL's;IADL's;Work;Leisure    Body Structure / Function / Physical Skills  ADL;ROM;IADL;Endurance;Body mechanics;Sensation;Mobility;Flexibility;Strength;Coordination;FMC;Pain;UE functional use;Decreased knowledge of use of DME  Rehab Potential  Good    Clinical Decision Making  Several treatment options, min-mod task modification necessary    Comorbidities Affecting Occupational Performance:  Presence of comorbidities impacting occupational performance    Comorbidities impacting occupational performance description:  Gangrenous toes, endurance/HR affected from covid    Modification or Assistance to Complete Evaluation   Min-Moderate modification of tasks or assist with assess necessary to complete eval    OT Frequency  2x / week    OT Duration  8 weeks   plus eval. May need to adjust frequency/duration if pt becomes MCD   OT Treatment/Interventions  Self-care/ADL training;Therapeutic exercise;Functional Mobility Training;Aquatic Therapy;Neuromuscular education;Manual Therapy;Energy conservation;Therapeutic activities;Coping strategies training;DME and/or AE instruction;Cognitive remediation/compensation;Visual/perceptual remediation/compensation;Moist Heat;Passive range of motion;Patient/family education    Plan  HEP for Rt hand coordination and strength    Consulted and Agree with Plan of Care  Patient;Family member/caregiver    Family Member Consulted  wife       Patient will benefit from skilled therapeutic intervention in order to improve the following deficits and impairments:   Body Structure / Function / Physical Skills: ADL, ROM, IADL, Endurance, Body mechanics, Sensation, Mobility, Flexibility, Strength, Coordination, FMC, Pain, UE functional use, Decreased knowledge of use of DME       Visit Diagnosis: Muscle weakness (generalized)  Unsteadiness on feet  Other lack of coordination  Other disturbances of skin  sensation    Problem List Patient Active Problem List   Diagnosis Date Noted  . Dependent on walker for ambulation 04/30/2019  . Right hemiparesis (HCC) 04/30/2019  . Tachycardia 04/30/2019  . Acute blood loss anemia   . Uncontrolled type 2 diabetes mellitus with hyperglycemia (HCC)   . Dry gangrene (HCC)   . Leukocytosis   . 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 Status post tracheostomy (HCC)   . Pressure injury of skin 03/02/2019  . On mechanically assisted ventilation (HCC)   . Goals of care, counseling/discussion   . Palliative care encounter   . ARDS (adult respiratory distress syndrome) (HCC)   . Cerebral embolism with cerebral infarction 02/17/2019  . Acute respiratory failure with hypoxia (HCC)   . Pneumothorax, right     Kelli Churn, OTR/L 05/10/2019, 2:12 PM  [facility] [address] [place], [place], [address] Phone: [phone]   Fax:  [phone]  Name: Andrew Bruce MRN: [id number] Date of Birth: Jul 05, 1965

## 2019-05-12 ENCOUNTER — Ambulatory Visit: Payer: Medicaid Other

## 2019-05-12 ENCOUNTER — Other Ambulatory Visit: Payer: Self-pay

## 2019-05-12 DIAGNOSIS — M6281 Muscle weakness (generalized): Secondary | ICD-10-CM | POA: Diagnosis not present

## 2019-05-12 DIAGNOSIS — R490 Dysphonia: Secondary | ICD-10-CM

## 2019-05-12 NOTE — Patient Instructions (Signed)
   Haga estas cosas para Counselling psychologist voz sana   Evite el uso excesivo de la voz   Multimedia programmer / Lexicographer la voz   Evite hablar por encima del ruido de fondo   Beba al menos 8-10 8 oz. vasos de agua al [employer]

## 2019-05-12 NOTE — Therapy (Addendum)
[facility] [address] [place], [place], [address] Phone: [phone]   Fax:  [phone]  Speech Language Pathology Evaluation  Patient Details  Name: Andrew Bruce MRN: [id number] Date of Birth: 1965/11/07 Referring Provider (SLP): Dionisio David, NP   Encounter Date: 05/12/2019  End of Session - 05/12/19 1714    Visit Number  1    Number of Visits  17    Date for SLP Re-Evaluation  08/10/19   90 days   SLP Start Time  33    SLP Stop Time   1401    SLP Time Calculation (min)  41 min    Activity Tolerance  Patient tolerated treatment well       Past Medical History:  Diagnosis Date  . COVID-19 02/06/2019    Past Surgical History:  Procedure Laterality Date  . DIRECT LARYNGOSCOPY Bilateral 04/06/2019   Procedure: MICRO DIRECT LARYNGOSCOPY WITH PROLARYN INJECTION;  Surgeon: Melida Quitter, MD;  Location: [facility];  Service: ENT;  Laterality: Bilateral;  . IR GASTROSTOMY TUBE MOD SED  03/10/2019  . IR GASTROSTOMY TUBE REMOVAL  04/20/2019    There were no vitals filed for this visit.  Subjective Assessment - 05/12/19 1332    Subjective  Pt denies using EMST at home, but was discharged with the device. Pt rated his voice 5/10 today (10=pt's normal voice)   Pain Score  1     Pain Location  Toe (Comment which one)    Pain Orientation  Right;Left    Pain Descriptors / Indicators  Aching;Numbness    Pain Type  Chronic pain    Pain Onset  More than a month ago    Pain Frequency  Intermittent         SLP Evaluation [facility] - 05/12/19 1332      SLP Visit Information   SLP Received On  05/12/19    Referring Provider (SLP)  Dionisio David, NP    Onset Date  January 2021    Medical Diagnosis  CVAs in light of COVID      Subjective   Patient/Family Stated Goal  Improve voice quality      General Information   HPI  [name] without a past medical history admitted 1/23 with 48 hours of  worsening shortness of breath in the context of a 10-day Covid illness (dry cough, lost taste/smell). Tested positive for Covid 01/26/19. Decompensated requiring intubation 1/24 and trach'd 2/16. 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 Additional smaller right frontal convexity infarct also noted, bilateral pneumothoraces & aortic clot. Pt has developed gangrene of toes as well. Pt without established PCP but reports he has a PCP establishment of care appt scheduled with Dionisio David, NP.       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);sustain/a/= 4.7 (below    Intelligibility  Intelligible    Phonation  Impaired    Vocal Abuses  Habitual Hyperphonia;Other (comment)   needs ENT consult for a vocal diagnosis   Tension Present  --   SLP cued pt for WNL vocal effort with phonation   Volume  Soft   upper 60s dB   Pitch  High  SLP Education - 05/12/19 1713    Education Details  vocal hygiene measures, need to use WNL effort    Person(s) Educated  Patient    Methods  Handout;Explanation    Comprehension  Verbalized understanding;Returned demonstration;Need further instruction         SLP Long Term Goals - 05/12/19 1727      SLP LONG TERM GOAL #1   Title  pt will report following 3 aspects of vocal conservation program between 4 sessions    Baseline  no aspects    Time  8    Period  Weeks   or visits, for all LTGs   Status  New      SLP LONG TERM GOAL #2   Title  pt will perform any HEP for voice, if deemed appropriate following ENT consult    Baseline  not provided yet    Time  8    Period  Weeks    Status  New      SLP LONG TERM GOAL #3   Title  pt will use abdominal breathing in 5  minutes simple conversation in 3 sessions    Baseline  chest breathing    Time  8    Period  Weeks    Status  New      SLP LONG TERM GOAL #4   Title  pt will use WNL vocal effort without strain/pushing for vocalization with rare min A during 3 ST sessions    Baseline  strainedpushed until SPL told pt not to do so    Time  8    Period  Weeks    Status  New      SLP LONG TERM GOAL #5   Title  pt will rate his voice at least 7/10 (where 10=WNL voice)    Baseline  5/10    Time  8    Period  Weeks    Status  New       Plan - 05/12/19 1715    Clinical Impression Statement  Pt presents with mod hoarseness (R49.0)  of unclear etiology. No ENT consult during inpatient stay. Today SLP provided pt with general voice hygiene measures, encouraging pt to follow guidelines to allow for optimal vocal health. SLP recommends ENT consult. SLP shared with pt today he should not push/strain to incr volume due to possibility of injuring voice in the future. SLP believes pt will benefit from at least x1/week ST for vocal hygiene and educton/training on abdominal breathing to maximize breath support for speech. Pt may have goals added following ENT consult.    Speech Therapy Frequency  1x /week    Duration  --   8 weeks   Treatment/Interventions  Functional tasks;Internal/external aids;Patient/family education;Compensatory strategies;SLP instruction and feedback;Other (comment)   vocal hygiene program   Potential to Achieve Goals  Fair   tentative potential- without ENT consult difficult to ascertain   SLP Home Exercise Plan  provided today    Consulted and Agree with Plan of Care  Patient       Patient will benefit from skilled therapeutic intervention in order to improve the following deficits and impairments:   Hoarseness of voice - Plan: SLP plan of care cert/re-cert    Problem List Patient Active Problem List   Diagnosis Date Noted  . Dependent on walker for ambulation 04/30/2019  . Right  hemiparesis (HCC) 04/30/2019  . Tachycardia 04/30/2019  . Acute blood loss anemia   . Uncontrolled type 2 diabetes  mellitus with hyperglycemia (HCC)   . Dry gangrene (HCC)   . Leukocytosis   . 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 Status post tracheostomy (HCC)   . Pressure injury of skin 03/02/2019  . On mechanically assisted ventilation (HCC)   . Goals of care, counseling/discussion   . Palliative care encounter   . ARDS (adult respiratory distress syndrome) (HCC)   . Cerebral embolism with cerebral infarction 02/17/2019  . Acute respiratory failure with hypoxia (HCC)   . Pneumothorax, right     [facility] 05/12/2019, 5:45 PM  [facility] [address] [place], [place], [address] Phone: [phone]   Fax:  [phone]  Name: Baer Hinton MRN: [id number] Date of Birth: [birth year]

## 2019-05-13 ENCOUNTER — Encounter: Payer: Self-pay | Admitting: Nurse Practitioner

## 2019-05-13 ENCOUNTER — Other Ambulatory Visit: Payer: Self-pay | Admitting: Nurse Practitioner

## 2019-05-13 ENCOUNTER — Ambulatory Visit (INDEPENDENT_AMBULATORY_CARE_PROVIDER_SITE_OTHER): Payer: Medicaid Other | Admitting: Nurse Practitioner

## 2019-05-13 VITALS — BP 141/91 | HR 80 | Temp 98.5°F | Ht 66.0 in | Wt 160.2 lb

## 2019-05-13 DIAGNOSIS — K5900 Constipation, unspecified: Secondary | ICD-10-CM

## 2019-05-13 DIAGNOSIS — L97509 Non-pressure chronic ulcer of other part of unspecified foot with unspecified severity: Secondary | ICD-10-CM | POA: Diagnosis not present

## 2019-05-13 DIAGNOSIS — E11621 Type 2 diabetes mellitus with foot ulcer: Secondary | ICD-10-CM | POA: Diagnosis not present

## 2019-05-13 DIAGNOSIS — I96 Gangrene, not elsewhere classified: Secondary | ICD-10-CM | POA: Diagnosis not present

## 2019-05-13 DIAGNOSIS — E1165 Type 2 diabetes mellitus with hyperglycemia: Secondary | ICD-10-CM | POA: Diagnosis not present

## 2019-05-13 DIAGNOSIS — H539 Unspecified visual disturbance: Secondary | ICD-10-CM

## 2019-05-13 DIAGNOSIS — I6310 Cerebral infarction due to embolism of unspecified precerebral artery: Secondary | ICD-10-CM

## 2019-05-13 LAB — POCT URINALYSIS DIPSTICK
Bilirubin, UA: NEGATIVE
Blood, UA: NEGATIVE
Glucose, UA: NEGATIVE
Leukocytes, UA: NEGATIVE
Nitrite, UA: NEGATIVE
Protein, UA: POSITIVE — AB
Spec Grav, UA: 1.025 (ref 1.010–1.025)
Urobilinogen, UA: 0.2 E.U./dL
pH, UA: 5.5 (ref 5.0–8.0)

## 2019-05-13 LAB — GLUCOSE, POCT (MANUAL RESULT ENTRY): POC Glucose: 142 mg/dl — AB (ref 70–99)

## 2019-05-13 LAB — POCT GLYCOSYLATED HEMOGLOBIN (HGB A1C): Hemoglobin A1C: 5.8 % — AB (ref 4.0–5.6)

## 2019-05-13 MED ORDER — DOCUSATE SODIUM 100 MG PO CAPS: 200.0000 mg | ORAL_CAPSULE | Freq: Every evening | ORAL | 0 refills | Status: AC | PRN

## 2019-05-13 MED ORDER — POLYETHYLENE GLYCOL 3350 17 GM/SCOOP PO POWD: 1.0000 | Freq: Once | ORAL | 0 refills | Status: AC

## 2019-05-13 NOTE — Progress Notes (Unsigned)
referr

## 2019-05-13 NOTE — Patient Instructions (Addendum)
Estreimiento en los adultos Constipation, Adult Se llama estreimiento cuando:  Tiene deposiciones (defeca) una menor cantidad de veces a la semana de lo normal.  Tiene dificultad para defecar.  Las heces son secas y duras o son ms grandes que lo normal. Siga estas indicaciones en su casa: Comida y bebida   Consuma alimentos con alto contenido de [place], por ejemplo: ? Lambert Mody y verduras frescas. ? Cereales integrales. ? Frijoles.  Consuma una menor cantidad de alimentos ricos en grasas, con bajo contenido de [place] o excesivamente procesados, como: ? Papas fritas. ? Hamburguesas. ? Galletas. ? Caramelos. ? Gaseosas.  Beba suficiente lquido para mantener el pis (orina) claro o de color amarillo plido. Instrucciones generales  Haga actividad fsica con regularidad o segn las indicaciones del mdico.  Vaya al bao cuando sienta la necesidad de defecar. No se aguante las ganas.  Tome los medicamentos de venta libre y los recetados solamente como se lo haya indicado el mdico. Estos incluyen los suplementos de [place].  Realice ejercicios de reentrenamiento del suelo plvico, como: ? Respirar profundamente mientras relaja la parte inferior del vientre (abdomen). ? Relajar el suelo plvico mientras defeca.  Controle su afeccin para ver si hay cambios.  Concurra a todas las visitas de control como se lo haya indicado el mdico. Esto es importante. Comunquese con un mdico si:  Siente un dolor que empeora.  Tiene fiebre.  No ha defecado por 4das.  Vomita.  No tiene hambre.  Pierde peso.  Tiene una hemorragia en el ano.  Las deposiciones Media planner) son delgadas como un lpiz. Solicite ayuda de inmediato si:  Jaclynn Guarneri, y los sntomas empeoran de repente.  Tiene prdida de materia fecal u observa [employer].  Siente el vientre ms duro o ms grande de lo normal (est hinchado).  Siente un dolor muy intenso en el vientre.  Se siente mareado o se  desmaya. Esta informacin no tiene Marine scientist el consejo del mdico. Asegrese de hacerle al mdico cualquier pregunta que tenga. Document Revised: 04/03/2016 Document Reviewed: 06/21/2015 Elsevier Patient Education  2020 [place] con diabetes Living With Diabetes La diabetes (diabetes mellitus tipo1 o diabetes mellitus tipo2) es una afeccin en la que el [place], o bien no tiene suficiente cantidad de una hormona llamada [place], o bien no responde a la insulina de forma correcta. Normalmente, la insulina permite que ciertos azcares (glucosa) ingresen a las clulas del cuerpo. Las clulas usan la glucosa para Dealer. En las personas que tienen diabetes, la glucosa se acumula en la sangre en lugar de ir a las clulas, lo que provoca un nivel alto de glucemia (hiperglucemia). Cmo manejar los cambios en el estilo de vida El control de la diabetes involucra tratamientos con medicamentos y cambios en el estilo de vida. Si la diabetes no se controla correctamente, pueden ocurrir complicaciones fsicas y Engineer, manufacturing systems graves. Cuidarse de manera responsable significa que se ocupa de lo siguiente:  Controlar la glucosa con regularidad.  Seguir una dieta saludable.  Hacer actividad fsica con regularidad.  Reunirse con los mdicos.  Tomar los medicamentos segn las indicaciones. Algunas personas podran sentir mucho estrs por el control de la diabetes. Esta situacin se conoce como estrs emocional y es muy comn. Vivir con diabetes puede implicar un riesgo de padecer estrs emocional, depresin o ansiedad. Estos trastornos pueden ser desconcertantes y pueden hacer que el control de la diabetes sea ms difcil. Cmo reconocer el estrs Estrs emocional Algunos de los  sntomas del estrs emocional:  Enojo por haber recibido el diagnstico de diabetes.  [place] o frustracin sobre el diagnstico y los cambios que debe hacer para Designer, industrial/product.  Preocuparse excesivamente por los cuidados que necesita o por el costo de [place].  Sentir que usted provoc la afeccin por haber hecho algo mal.  [place] a situaciones imprevisibles, como niveles altos o bajos de glucemia.  Sentirse juzgado [employer].  Sentirse muy solo para afrontar la enfermedad.  Cansarse demasiado o sentir que la situacin lo supera debido a las demandas de los cuidados diarios. Depresin Si tiene diabetes, tiene un riesgo mayor de Marine scientist depresin. Si tiene depresin, tambin tiene un riesgo mayor de padecer diabetes. El mdico podra evaluarlo (examinarlo) para detectar sntomas de depresin. Es Public librarian los sntomas de la depresin y Engineer, agricultural tratamiento para la depresin apenas se obtenga el diagnstico. A continuacin, se presentan algunos de los sntomas de la depresin:  Falta de inters por las cosas que antes disfrutaba.  Problemas para dormir o despertarse muy temprano con frecuencia y no poder volver a dormir.  Cambios en el apetito.  Sentirse cansado la mayor parte del da.  Sentirse nervioso y [employer].  Sentirse culpable y sentir que es una carga para los dems.  Sentirse deprimido la mayor parte del [place].  Pensamientos sobre lastimarse a usted mismo o sentir que quiere morir. Si tiene alguno de estos sntomas durante 2semanas o ms, comunquese con el mdico. Siga estas instrucciones en su casa: Control del estrs emocional A continuacin, se presentan algunos modos de controlar el estrs emocional:  Hable con el mdico o con el educador certificado en diabetes. Considere la posibilidad de trabajar con un asesor o un terapeuta.  Aprenda lo ms que pueda sobre la diabetes y Economist. Renase con un educador certificado en diabetes o tome clases para aprender a Passenger transport manager.  Lleve un diario donde anote sus pensamientos y sus preocupaciones.  Acepte que no puede controlar todo.  Hable con  otras personas que tienen diabetes. Puede ser til hablar con otros acerca del estrs emocional que siente.  Encuentre maneras de Sales executive estrs que le resulten tiles. Por ejemplo, arte, musicoterapia, actividad fsica, meditacin y pasatiempos.  Busque apoyo en lderes espirituales, familiares y amigos. Indicaciones generales  Siga su plan de control de la diabetes.  Concurra a todas las visitas de [address] se lo haya indicado el mdico. Esto es importante. Dnde encontrar apoyo   Pdale al [employer] recomiende un terapeuta que comprenda tanto la depresin como la diabetes.  Busque informacin y apoyo en la American Diabetes Association (Asociacin Americana de la Diabetes): www.diabetes.org  Busque un educador certificado en diabetes y programe una consulta a travs de la American Diabetes Association (Asociacin Americana de la Diabetes): www.diabeteseducator.org Solicite ayuda inmediatamente si:  Tiene pensamientos acerca de lastimarse a usted mismo o a Economist. Si alguna vez siente que puede lastimarse a usted mismo o a Economist, o tiene pensamientos de poner fin a su vida, busque ayuda de inmediato. Puede dirigirse al servicio de emergencias ms cercano o comunicarse con:  El servicio de emergencias de su localidad (911 en [place].).  Una lnea de asistencia al suicida y Visual merchandiser en crisis, como la National Suicide Prevention Lifeline (Lnea Nacional de Prevencin del Suicidio), al [phone]. Est disponible las 24 horas del da. Resumen  La diabetes (diabetes mellitus tipo1 o diabetes mellitus tipo2) es una afeccin en la que el  organismo, o bien no tiene suficiente cantidad de una hormona llamada insulina, o bien no responde a la insulina de Scientist, research (medical).  Vivir con diabetes puede aumentar el riesgo de padecer ciertos problemas mdicos, as como ciertos problemas emocionales, incluidos estrs emocional, depresin y ansiedad.  Reconocer  los sntomas del estrs emocional y de la depresin podra evitar tener problemas con el control de la diabetes. Es importante que comience el tratamiento para el estrs emocional y la depresin apenas se diagnostiquen.  Si tiene diabetes, tiene un riesgo mayor de Marine scientist depresin. Pdale al mdico que le recomiende un terapeuta que comprenda tanto la depresin como la diabetes.  Si tiene sntomas de Administrator, sports o de depresin, es importante que hable sobre ellos con el mdico, un educador certificado en diabetes o un terapeuta. Esta informacin no tiene Theme park manager el consejo del mdico. Asegrese de hacerle al mdico cualquier pregunta que tenga. Document Revised: 02/25/2018 Document Reviewed: 08/01/2016 Elsevier Patient Education  2020 [employer].   La diabetes mellitus y el cuidado de los pies Diabetes Mellitus and Foot Care El cuidado de los pies es un aspecto importante de la salud, especialmente si tiene diabetes. La diabetes puede generar problemas debido a que el flujo sanguneo (circulacin) es deficiente en las piernas y los pies, y esto puede hacer que la piel:  Se torne ms fina y Magazine features editor.  Se resquebraje ms fcilmente.  Cicatrice ms lentamente.  Se descame y agriete. Tambin pueden estar daados los nervios (neuropata) de las piernas y de los pies, lo que provoca una disminucin de la sensibilidad. En consecuencia, es posible que no advierta heridas pequeas en los pies que pueden causar problemas ms graves. Identificar y tratar cualquier complicacin lo antes posible es la mejor manera de evitar futuros problemas de pie. Cmo cuidar los pies Higiene de los pies  [employer] pies todos los [address] con agua tibia y un [place]. No use agua caliente. Luego squese los pies y [employer] dedos dando palmaditas, hasta que estn completamente secos. No remoje los pies, ya que esto puede resecar la piel.  Crtese las uas de los pies en lnea recta. No escarbe debajo  de las uas o alrededor [employer]. Lime los bordes de las uas con una lima o esmeril.  Aplique una locin hidratante o vaselina en la piel de los pies y en las uas secas y [place]. Use una locin que no contenga alcohol ni fragancias. No aplique locin entre los dedos. Zapatos y calcetines  Use calcetines de algodn o medias limpias todos los [place]. Asegrese de que no le [employer]. No use calcetines que le lleguen a las rodillas, ya que podran disminuir el flujo de sangre a las piernas.  Use zapatos de cuero que le queden bien y que sean acolchados. Revise siempre los zapatos antes de ponerlos para asegurarse de que no haya objetos en su interior.  Para amoldar los zapatos, clcelos solo algunas horas por da. Esto evitar lesiones en los pies. Heridas, rasguos, durezas y callosidades  Controle sus pies diariamente para observar si hay ampollas, cortes, moretones, llagas o enrojecimiento. Si no puede ver la planta del pie, use un espejo o pdale ayuda a Engineer, maintenance (IT).  No corte las durezas o callosidades, ni trate de quitarlas con medicamentos.  Si algo le ha raspado, cortado o lastimado la piel de los pies, mantenga la piel de esa zona limpia y [place]. Puede higienizar estas zonas con agua y un jabn suave.  No limpie la zona con agua oxigenada, alcohol ni yodo.  Si tiene una herida, un rasguo, una dureza o una callosidad en el pie, revsela varias veces al da para asegurarse de que se est curando y no se infecte. Est atento a los siguientes signos: ? Dolor, hinchazn o enrojecimiento. ? Lquido o sangre. ? Calor. ? Pus o mal olor. Instrucciones generales  No se cruce de piernas. Esto puede disminuir el flujo de sangre a los pies.  No use bolsas de agua caliente ni almohadillas trmicas en los pies. Podran causar quemaduras. Si ha perdido la sensibilidad en los pies o las piernas, no sabr lo que le est sucediendo hasta que sea demasiado tarde.  Proteja sus pies  del calor y del fro con calzado, en la playa o sobre el pavimento caliente.  Programe una cita para un examen completo de los pies por lo menos una vez al ao (anualmente) o con ms frecuencia si tiene [employer] Rx. Si tiene Care[employer] Rx, infrmele al mdico de [employer] cortes, las llagas o los moretones. Comunquese con un mdico si:  Tiene una afeccin que aumenta su riesgo de tener infecciones y tiene cortes, llagas o moretones en los pies.  Tiene una lesin que no se [place].  Tiene una zona irritada en las piernas o los pies.  Siente una sensacin de ardor u hormigueo en las piernas o los pies.  Siente dolor o calambres en las piernas o los pies.  Las piernas o los pies estn adormecidos.  Siente los pies siempre fros.  Siente dolor alrededor de una ua del pie. Solicite ayuda de inmediato si:  Tiene una herida, un rasguo, una dureza o una callosidad en el pie y: ? Licensed conveyancer, hinchazn o enrojecimiento que empeora. ? Le sale lquido o sangre de la herida, el rasguo, la dureza o la callosidad. ? La herida, el rasguo, la dureza o la callosidad est caliente al tacto. ? Le sale pus o mal olor de la herida, el rasguo, la dureza o la callosidad. ? Tiene fiebre. ? Tiene una lnea roja que sube por la pierna. Resumen  Controle todos los [address] de sus pies para observar si hay cortes, llagas, manchas rojas, hinchazn o ampollas.  Humctese los pies y las piernas a diario.  Use zapatos de cuero que le queden bien y que sean acolchados.  Si tiene [employer] Rx, infrmele al mdico de [employer] cortes, las llagas o los moretones.  Programe una cita para un examen completo de los pies por lo menos una vez al ao (anualmente) o con ms frecuencia si tiene Caremark Rx. Esta informacin no tiene Theme park manager el consejo del mdico. Asegrese de hacerle al mdico cualquier pregunta que tenga. Document Revised:  08/23/2016 Document Reviewed: 08/23/2016 Elsevier Patient Education  2020 [employer].

## 2019-05-13 NOTE — Progress Notes (Signed)
New Patient Office Visit  Subjective:  Patient ID: Andrew Bruce, male    DOB: Jan 23, 1965  Age: 54 y.o. MRN: [id number]  CC:  Chief Complaint  Patient presents with  . New Patient (Initial Visit)  . Hospitalization Follow-up    Hospital Discharge 04/19/2020 Stroke, COVID, DM     HPI Andrew Bruce presents for new patient. He  has a past medical history of COVID-19 (02/06/2019).  He had a 74-day hospitalization.  He is in today with his daughter. He was has been on home since 04/20/19.  He is newly diagnosed with diabetes mellitus.  Diabetes Mellitus Patient presents for follow up of diabetes. Current symptoms include:  tinlging on both hand digits 4 ad 5 and both feet; necrotic discoloration of both feet with a small ulcer. . Symptoms have stabilized. Patient denies hypoglycemia , increased appetite, nausea, polydipsia, polyuria and vomiting. Evaluation to date has included: fasting blood sugar, fasting lipid panel, hemoglobin A1C and microalbuminuria.  Home sugars: BGs consistently in an acceptable range. 140-150 Current treatment: Continued sulfonylurea which has been effective and Continued metformin which has been effective. Last dilated eye exam: unknown. He is unable see close up. He is following up with podiatry.    He is also in follow up of a stroke. Event occurred 2 months ago. He has residual symptoms of gait disturbance, numbness or tingling of both feet and numbness or tingling of both hands. He denies facial droop, cognitive impairment and inability to speak. Overall he feels his condition is stable. Stroke risk factors include: diabetes mellitus.  He denies aphasia, confusion, difficulty swallowing, difficulty understanding, dizziness, drowsiness, facial droop, global aphasia, headache, horizontal diplopia, lethargy, slurred speech, vertigo and word finding difficulties.  He is currently on Eliquis.  He denies any signs and symptoms of bleeding.  Past Medical History:   Diagnosis Date  . COVID-19 02/06/2019    Past Surgical History:  Procedure Laterality Date  . DIRECT LARYNGOSCOPY Bilateral 04/06/2019   Procedure: MICRO DIRECT LARYNGOSCOPY WITH PROLARYN INJECTION;  Surgeon: Melida Quitter, MD;  Location: [facility];  Service: ENT;  Laterality: Bilateral;  . IR GASTROSTOMY TUBE MOD SED  03/10/2019  . IR GASTROSTOMY TUBE REMOVAL  04/20/2019    Family History  Problem Relation Age of Onset  . Healthy Sister   . Healthy Brother     Social History   Socioeconomic History  . Marital status: Married    Spouse name: Not on file  . Number of children: Not on file  . Years of education: Not on file  . Highest education level: Not on file  Occupational History  . Not on file  Tobacco Use  . Smoking status: Former Research scientist (life sciences)  . Smokeless tobacco: Never Used  Substance and Sexual Activity  . Alcohol use: Yes  . Drug use: Never  . Sexual activity: Yes  Other Topics Concern  . Not on file  Social History Narrative  . Not on file   Social Determinants of Health   Financial Resource Strain:   . Difficulty of Paying Living Expenses:   Food Insecurity:   . Worried About Charity fundraiser in the Last Year:   . Arboriculturist in the Last Year:   Transportation Needs:   . Film/video editor (Medical):   Marland Kitchen Lack of Transportation (Non-Medical):   Physical Activity:   . Days of Exercise per Week:   . Minutes of Exercise per Session:   Stress:   . Feeling of  Stress :   Social Connections:   . Frequency of Communication with Friends and Family:   . Frequency of Social Gatherings with Friends and Family:   . Attends Religious Services:   . Active Member of Clubs or Organizations:   . Attends Archivist Meetings:   Marland Kitchen Marital Status:   Intimate Partner Violence:   . Fear of Current or Ex-Partner:   . Emotionally Abused:   Marland Kitchen Physically Abused:   . Sexually Abused:     ROS Review of Systems  Gastrointestinal: Positive for constipation.     Objective:   Today's Vitals: BP (!) 141/91   Pulse 80   Temp 98.5 F (36.9 C)   Ht 5' 6"  (1.676 m)   Wt 160 lb 3.2 oz (72.7 kg)   SpO2 98%   BMI 25.86 kg/m   Physical Exam Constitutional:      Appearance: Normal appearance.  HENT:     Head: Normocephalic and atraumatic.     Nose: Nose normal.     Mouth/Throat:     Mouth: Mucous membranes are moist.     Pharynx: Oropharynx is clear.  Eyes:     Extraocular Movements: Extraocular movements intact.     Pupils: Pupils are equal, round, and reactive to light.  Neck:     Comments: Well-healed surgical scar Cardiovascular:     Rate and Rhythm: Normal rate and regular rhythm.     Pulses: Normal pulses.     Heart sounds: Normal heart sounds.  Pulmonary:     Effort: Pulmonary effort is normal.     Breath sounds: Normal breath sounds.     Comments: Well-healed surgical scar Abdominal:     General: Abdomen is flat.     Palpations: Abdomen is soft.     Comments: Hypoactive bowel sounds Well-healed surgical scar  Musculoskeletal:        General: Tenderness present.     Cervical back: Normal range of motion.       Feet:     Comments: Right shoulder aching and tenderness  Feet:     Right foot:     Skin integrity: Ulcer, skin breakdown and erythema present.     Left foot:     Skin integrity: Ulcer, skin breakdown and erythema present.  Skin:    General: Skin is warm and dry.  Neurological:     Mental Status: He is alert and oriented to person, place, and time.     Cranial Nerves: Cranial nerve deficit present.     Gait: Gait abnormal.     Comments: Walker in use however able to ambulate without walker around the room  Psychiatric:        Mood and Affect: Mood normal.        Behavior: Behavior normal.        Thought Content: Thought content normal.        Judgment: Judgment normal.     Assessment & Plan:   Problem List Items Addressed This Visit      Unprioritized   Dry gangrene ([place])   Uncontrolled type 2  diabetes mellitus with hyperglycemia ([place])   Relevant Orders   POCT urinalysis dipstick (Completed)   POCT glucose (manual entry) (Completed)   POCT glycosylated hemoglobin (Hb A1C) (Completed)   CBC with Differential/Platelet (Completed)   Comp. Metabolic Panel (12) (Completed)    Other Visit Diagnoses    Type 2 diabetes, controlled, with ulcer of toe ([place])    -  Primary  Under surveillance to follow-up with podiatry as scheduled   Constipation, unspecified constipation type       Encouraged the use of stool softener twice daily however if this is not effective patient to use MiraLAX   Relevant Medications   docusate sodium (COLACE) 100 MG capsule      Outpatient Encounter Medications as of 05/13/2019  Medication Sig  . acetaminophen (TYLENOL) 325 MG tablet Take 1-2 tablets (325-650 mg total) by mouth every 4 (four) hours as needed for mild pain.  . Amino Acids-Protein Hydrolys (FEEDING SUPPLEMENT, PRO-STAT SUGAR FREE 64,) LIQD Take 30 mLs by mouth 3 (three) times daily.  Marland Kitchen apixaban (ELIQUIS) 5 MG TABS tablet Take 1 tablet (5 mg total) by mouth 2 (two) times daily.  Marland Kitchen ascorbic acid (VITAMIN C) 500 MG tablet Take 1 tablet (500 mg total) by mouth daily.  Marland Kitchen atorvastatin (LIPITOR) 20 MG tablet Take 1 tablet (20 mg total) by mouth daily at 6 PM.  . blood glucose meter kit and supplies KIT Dispense based on patient and insurance preference. Use up to four times daily as directed. (FOR ICD-9 250.00, 250.01).  . collagenase (SANTYL) ointment Apply topically daily. To sacrum and cover with damp to dry dressing  . doxazosin (CARDURA) 1 MG tablet Take 1 tablet (1 mg total) by mouth at bedtime.  . famotidine (PEPCID) 20 MG tablet Take 1 tablet (20 mg total) by mouth 2 (two) times daily.  Marland Kitchen glipiZIDE (GLUCOTROL) 5 MG tablet Take 0.5 tablets (2.5 mg total) by mouth daily before breakfast.  . guaiFENesin (ROBITUSSIN) 100 MG/5ML SOLN Take 10 mLs (200 mg total) by mouth every 6 (six) hours as needed  for cough or to loosen phlegm.  . hydroxypropyl methylcellulose / hypromellose (ISOPTO TEARS / GONIOVISC) 2.5 % ophthalmic solution Place 1 drop into both eyes 4 (four) times daily as needed for dry eyes.  . iron polysaccharides (NIFEREX) 150 MG capsule Take 1 capsule (150 mg total) by mouth 2 (two) times daily.  . metFORMIN (GLUCOPHAGE) 500 MG tablet Take 1 tablet (500 mg total) by mouth 2 (two) times daily with a meal.  . metoprolol tartrate (LOPRESSOR) 25 MG tablet Take 0.5 tablets (12.5 mg total) by mouth 2 (two) times daily.  . potassium chloride (KLOR-CON) 10 MEQ tablet Take 1 tablet (10 mEq total) by mouth daily.  Marland Kitchen senna-docusate (SENOKOT-S) 8.6-50 MG tablet Place 1 tablet into feeding tube at bedtime as needed for mild constipation.  . traMADol (ULTRAM) 50 MG tablet Take 1 tablet (50 mg total) by mouth every 6 (six) hours as needed for moderate pain or severe pain.  Marland Kitchen zinc sulfate 220 (50 Zn) MG capsule Take 1 capsule (220 mg total) by mouth daily.  Marland Kitchen docusate sodium (COLACE) 100 MG capsule Take 2 capsules (200 mg total) by mouth at bedtime as needed for moderate constipation. Do not use longer than 7 days.  . [EXPIRED] polyethylene glycol powder (MIRALAX) 17 GM/SCOOP powder Take 255 g by mouth once for 1 dose.  . traZODone (DESYREL) 50 MG tablet Take 0.5 tablets (25 mg total) by mouth at bedtime as needed for sleep. (Patient not taking: Reported on 05/13/2019)   No facility-administered encounter medications on file as of 05/13/2019.    Follow-up: Return in about 4 weeks (around 06/10/2019).   Vevelyn Francois, NP

## 2019-05-14 LAB — COMP. METABOLIC PANEL (12)
AST: 18 IU/L (ref 0–40)
Albumin/Globulin Ratio: 1.5 (ref 1.2–2.2)
Albumin: 4.4 g/dL (ref 3.8–4.9)
Alkaline Phosphatase: 87 IU/L (ref 39–117)
BUN/Creatinine Ratio: 17 (ref 9–20)
BUN: 10 mg/dL (ref 6–24)
Bilirubin Total: 0.3 mg/dL (ref 0.0–1.2)
Calcium: 10.1 mg/dL (ref 8.7–10.2)
Chloride: 102 mmol/L (ref 96–106)
Creatinine, Ser: 0.6 mg/dL — ABNORMAL LOW (ref 0.76–1.27)
GFR calc Af Amer: 134 mL/min/{1.73_m2} (ref >59)
GFR calc non Af Amer: 116 mL/min/{1.73_m2} (ref >59)
Globulin, Total: 3 g/dL (ref 1.5–4.5)
Glucose: 131 mg/dL — ABNORMAL HIGH (ref 65–99)
Potassium: 4.2 mmol/L (ref 3.5–5.2)
Sodium: 141 mmol/L (ref 134–144)
Total Protein: 7.4 g/dL (ref 6.0–8.5)

## 2019-05-14 LAB — CBC WITH DIFFERENTIAL/PLATELET
Basophils Absolute: 0.1 10*3/uL (ref 0.0–0.2)
Basos: 1 %
EOS (ABSOLUTE): 0.2 10*3/uL (ref 0.0–0.4)
Eos: 2 %
Hematocrit: 44.4 % (ref 37.5–51.0)
Hemoglobin: 14.1 g/dL (ref 13.0–17.7)
Immature Grans (Abs): 0.1 10*3/uL (ref 0.0–0.1)
Immature Granulocytes: 1 %
Lymphocytes Absolute: 2.6 10*3/uL (ref 0.7–3.1)
Lymphs: 29 %
MCH: 26.4 pg — ABNORMAL LOW (ref 26.6–33.0)
MCHC: 31.8 g/dL (ref 31.5–35.7)
MCV: 83 fL (ref 79–97)
Monocytes Absolute: 0.6 10*3/uL (ref 0.1–0.9)
Monocytes: 7 %
Neutrophils Absolute: 5.4 10*3/uL (ref 1.4–7.0)
Neutrophils: 60 %
Platelets: 386 10*3/uL (ref 150–450)
RBC: 5.35 x10E6/uL (ref 4.14–5.80)
RDW: 15.9 % — ABNORMAL HIGH (ref 11.6–15.4)
WBC: 8.9 10*3/uL (ref 3.4–10.8)

## 2019-05-18 ENCOUNTER — Encounter: Payer: Self-pay | Admitting: Vascular Surgery

## 2019-05-18 ENCOUNTER — Ambulatory Visit (INDEPENDENT_AMBULATORY_CARE_PROVIDER_SITE_OTHER): Payer: Medicaid Other | Admitting: Vascular Surgery

## 2019-05-18 ENCOUNTER — Other Ambulatory Visit: Payer: Self-pay

## 2019-05-18 VITALS — BP 129/84 | HR 86 | Temp 98.1°F | Resp 20 | Ht 66.0 in | Wt 160.0 lb

## 2019-05-18 DIAGNOSIS — I96 Gangrene, not elsewhere classified: Secondary | ICD-10-CM

## 2019-05-18 NOTE — Progress Notes (Signed)
Patient name: Andrew Bruce MRN: [id number] DOB: [birth year] Sex: male  REASON FOR CONSULT: Hospital follow-up for ischemic toes  HPI: Randolf Sansoucie is a 54 y.o. male, history of COVID-19 that presents for hospital follow-up for evaluation of gangrenous changes to bilateral lower extremities.  He was initiailly seen in consultation on 04/15/2019 and this was likely felt to be due to a pressure induced changes during his COVID-19 hospitalization.  He had ARDS and very prolonged hospitalization.  Ultimately we had recommended toe amputations versus TMA while he was in the hospital given all the tissue was well demarcated.  He wanted to avoid amputation and ultimately was discharged.  He's had easily palpable pulses.    Past Medical History:  Diagnosis Date  . COVID-19 02/06/2019    Past Surgical History:  Procedure Laterality Date  . DIRECT LARYNGOSCOPY Bilateral 04/06/2019   Procedure: MICRO DIRECT LARYNGOSCOPY WITH PROLARYN INJECTION;  Surgeon: Melida Quitter, MD;  Location: [place];  Service: ENT;  Laterality: Bilateral;  . IR GASTROSTOMY TUBE MOD SED  03/10/2019  . IR GASTROSTOMY TUBE REMOVAL  04/20/2019    Family History  Problem Relation Age of Onset  . Healthy Sister   . Healthy Brother     SOCIAL HISTORY: Social History   Socioeconomic History  . Marital status: Married    Spouse name: Not on file  . Number of children: Not on file  . Years of education: Not on file  . Highest education level: Not on file  Occupational History  . Not on file  Tobacco Use  . Smoking status: Former Research scientist (life sciences)  . Smokeless tobacco: Never Used  Substance and Sexual Activity  . Alcohol use: Yes  . Drug use: Never  . Sexual activity: Yes  Other Topics Concern  . Not on file  Social History Narrative  . Not on file   Social Determinants of Health   Financial Resource Strain:   . Difficulty of Paying Living Expenses:   Food Insecurity:   . Worried About Charity fundraiser in the Last  Year:   . Arboriculturist in the Last Year:   Transportation Needs:   . Film/video editor (Medical):   Marland Kitchen Lack of Transportation (Non-Medical):   Physical Activity:   . Days of Exercise per Week:   . Minutes of Exercise per Session:   Stress:   . Feeling of Stress :   Social Connections:   . Frequency of Communication with Friends and Family:   . Frequency of Social Gatherings with Friends and Family:   . Attends Religious Services:   . Active Member of Clubs or Organizations:   . Attends Archivist Meetings:   Marland Kitchen Marital Status:   Intimate Partner Violence:   . Fear of Current or Ex-Partner:   . Emotionally Abused:   Marland Kitchen Physically Abused:   . Sexually Abused:     No Known Allergies  Current Outpatient Medications  Medication Sig Dispense Refill  . acetaminophen (TYLENOL) 325 MG tablet Take 1-2 tablets (325-650 mg total) by mouth every 4 (four) hours as needed for mild pain. 100 tablet 0  . Amino Acids-Protein Hydrolys (FEEDING SUPPLEMENT, PRO-STAT SUGAR FREE 64,) LIQD Take 30 mLs by mouth 3 (three) times daily. 887 mL 0  . apixaban (ELIQUIS) 5 MG TABS tablet Take 1 tablet (5 mg total) by mouth 2 (two) times daily. 60 tablet 1  . ascorbic acid (VITAMIN C) 500 MG tablet Take 1 tablet (500 mg  total) by mouth daily. 30 tablet 0  . atorvastatin (LIPITOR) 20 MG tablet Take 1 tablet (20 mg total) by mouth daily at 6 PM. 30 tablet 0  . blood glucose meter kit and supplies KIT Dispense based on patient and insurance preference. Use up to four times daily as directed. (FOR ICD-9 250.00, 250.01). 1 each 0  . collagenase (SANTYL) ointment Apply topically daily. To sacrum and cover with damp to dry dressing 30 g 1  . docusate sodium (COLACE) 100 MG capsule Take 2 capsules (200 mg total) by mouth at bedtime as needed for moderate constipation. Do not use longer than 7 days. 60 capsule 0  . doxazosin (CARDURA) 1 MG tablet Take 1 tablet (1 mg total) by mouth at bedtime. 30 tablet 0    . famotidine (PEPCID) 20 MG tablet Take 1 tablet (20 mg total) by mouth 2 (two) times daily. 60 tablet 1  . glipiZIDE (GLUCOTROL) 5 MG tablet Take 0.5 tablets (2.5 mg total) by mouth daily before breakfast. 30 tablet 0  . guaiFENesin (ROBITUSSIN) 100 MG/5ML SOLN Take 10 mLs (200 mg total) by mouth every 6 (six) hours as needed for cough or to loosen phlegm. 236 mL 0  . hydroxypropyl methylcellulose / hypromellose (ISOPTO TEARS / GONIOVISC) 2.5 % ophthalmic solution Place 1 drop into both eyes 4 (four) times daily as needed for dry eyes. 15 mL 12  . iron polysaccharides (NIFEREX) 150 MG capsule Take 1 capsule (150 mg total) by mouth 2 (two) times daily. 60 capsule 0  . metFORMIN (GLUCOPHAGE) 500 MG tablet Take 1 tablet (500 mg total) by mouth 2 (two) times daily with a meal. 60 tablet 0  . metoprolol tartrate (LOPRESSOR) 25 MG tablet Take 0.5 tablets (12.5 mg total) by mouth 2 (two) times daily. 30 tablet 0  . potassium chloride (KLOR-CON) 10 MEQ tablet Take 1 tablet (10 mEq total) by mouth daily. 30 tablet 0  . senna-docusate (SENOKOT-S) 8.6-50 MG tablet Place 1 tablet into feeding tube at bedtime as needed for mild constipation. 30 tablet 0  . traMADol (ULTRAM) 50 MG tablet Take 1 tablet (50 mg total) by mouth every 6 (six) hours as needed for moderate pain or severe pain. 120 tablet 0  . zinc sulfate 220 (50 Zn) MG capsule Take 1 capsule (220 mg total) by mouth daily. 30 capsule 0  . traZODone (DESYREL) 50 MG tablet Take 0.5 tablets (25 mg total) by mouth at bedtime as needed for sleep. (Patient not taking: Reported on 05/13/2019) 15 tablet 0   No current facility-administered medications for this visit.    REVIEW OF SYSTEMS:  [X]  denotes positive finding, [ ]  denotes negative finding Cardiac  Comments:  Chest pain or chest pressure:    Shortness of breath upon exertion:    Short of breath when lying flat:    Irregular heart rhythm:        Vascular    Pain in calf, thigh, or hip brought  on by ambulation:    Pain in feet at night that wakes you up from your sleep:     Blood clot in your veins:    Leg swelling:         Pulmonary    Oxygen at home:    Productive cough:     Wheezing:         Neurologic    Sudden weakness in arms or legs:     Sudden numbness in arms or legs:  Sudden onset of difficulty speaking or slurred speech:    Temporary loss of vision in one eye:     Problems with dizziness:         Gastrointestinal    Blood in stool:     Vomited blood:         Genitourinary    Burning when urinating:     Blood in urine:        Psychiatric    Major depression:         Hematologic    Bleeding problems:    Problems with blood clotting too easily:        Skin    Rashes or ulcers:        Constitutional    Fever or chills:      PHYSICAL EXAM: Vitals:   05/18/19 1418  BP: 129/84  Pulse: 86  Resp: 20  Temp: 98.1 F (36.7 C)  SpO2: 98%  Weight: 160 lb (72.6 kg)  Height: 5' 6"  (1.676 m)    GENERAL: The patient is a well-nourished male, in no acute distress. The vital signs are documented above. CARDIAC: There is a regular rate and rhythm.  VASCULAR:  Palpable DP/Pt pulses bilaterally  Dry gangrene of toes and well demarcated as pictured below PULMONARY: There is good air exchange bilaterally without wheezing or rales. ABDOMEN: Soft and non-tender with normal pitched bowel sounds.  MUSCULOSKELETAL: There are no major deformities or cyanosis. NEUROLOGIC: No focal weakness or paresthesias are detected.       DATA:   None  Assessment/Plan:  54 year old male presents with ischemic changes to his bilateral lower extremities as pictured above after prolonged hospitalization with COVID-19 earlier this year.  He has palpable pedal pulses bilaterally.  He was previously offered attempts at bilateral toe amputations versus TMA's and patient has refused.  I offered him surgery again today and he thinks that his feet are getting better.  I  discussed I have low expectations that these will heal on their own but certainly could auto amputate over time.  We will bring him back in 1 month for wound check.  Discussed to give [place] a call if he has any worsening tissue loss or signs of infection.   Marty Heck, MD [facility] Office: [phone]

## 2019-05-24 ENCOUNTER — Other Ambulatory Visit: Payer: Self-pay

## 2019-05-24 ENCOUNTER — Ambulatory Visit: Payer: Medicaid Other | Attending: Physical Medicine and Rehabilitation

## 2019-05-24 DIAGNOSIS — R498 Other voice and resonance disorders: Secondary | ICD-10-CM | POA: Insufficient documentation

## 2019-05-24 DIAGNOSIS — R278 Other lack of coordination: Secondary | ICD-10-CM | POA: Insufficient documentation

## 2019-05-24 DIAGNOSIS — G8191 Hemiplegia, unspecified affecting right dominant side: Secondary | ICD-10-CM | POA: Insufficient documentation

## 2019-05-24 DIAGNOSIS — J3800 Paralysis of vocal cords and larynx, unspecified: Secondary | ICD-10-CM | POA: Diagnosis present

## 2019-05-24 DIAGNOSIS — R2689 Other abnormalities of gait and mobility: Secondary | ICD-10-CM | POA: Diagnosis present

## 2019-05-24 DIAGNOSIS — R490 Dysphonia: Secondary | ICD-10-CM | POA: Insufficient documentation

## 2019-05-24 DIAGNOSIS — M6281 Muscle weakness (generalized): Secondary | ICD-10-CM | POA: Insufficient documentation

## 2019-05-24 DIAGNOSIS — R1312 Dysphagia, oropharyngeal phase: Secondary | ICD-10-CM | POA: Insufficient documentation

## 2019-05-24 DIAGNOSIS — R2681 Unsteadiness on feet: Secondary | ICD-10-CM | POA: Diagnosis present

## 2019-05-24 DIAGNOSIS — R209 Unspecified disturbances of skin sensation: Secondary | ICD-10-CM | POA: Diagnosis present

## 2019-05-24 DIAGNOSIS — I639 Cerebral infarction, unspecified: Secondary | ICD-10-CM | POA: Diagnosis present

## 2019-05-24 NOTE — Therapy (Signed)
[facility] [address] [place], [place], [address] Phone: [phone]   Fax:  [phone]  Physical Therapy Treatment  Patient Details  Name: Andrew Bruce MRN: [id number] Date of Birth: 1965/12/12 Referring Provider (PT): Delle Reining, [place]   Encounter Date: 05/24/2019  PT End of Session - 05/24/19 1538    Visit Number  2    Number of Visits  9    Date for PT Re-Evaluation  07/05/19    Authorization Type  Eval: 05/10/19 (medicaid)    PT Start Time  1445    PT Stop Time  1530    PT Time Calculation (min)  45 min    Equipment Utilized During Treatment  Gait belt    Activity Tolerance  Patient tolerated treatment well    Behavior During Therapy  [facility] for tasks assessed/performed       Past Medical History:  Diagnosis Date  . COVID-19 02/06/2019    Past Surgical History:  Procedure Laterality Date  . DIRECT LARYNGOSCOPY Bilateral 04/06/2019   Procedure: MICRO DIRECT LARYNGOSCOPY WITH PROLARYN INJECTION;  Surgeon: Christia Reading, MD;  Location: [facility] OR;  Service: ENT;  Laterality: Bilateral;  . IR GASTROSTOMY TUBE MOD SED  03/10/2019  . IR GASTROSTOMY TUBE REMOVAL  04/20/2019    There were no vitals filed for this visit.  Subjective Assessment - 05/24/19 1508    Subjective  Pt reports he is walking 20 min every day    Patient is accompained by:  Family member;Interpreter    Pertinent History  COVID in patient stay, hx of CVA, hx of PE, gangerene of toes    Limitations  Standing;Walking;House hold activities    How long can you stand comfortably?  5-10 min    How long can you walk comfortably?  inside home only    Patient Stated Goals  get stronger    Currently in Pain?  No/denies    Pain Onset  More than a month ago             Treatment:  Nustep: level 5 for 10' Stairs: 8 steps: cues for going up with R LE and coming down with L LE first, pt performs step to pattern with using rail on R going up Sit to  stand: 10x Standing hip flexion, hip abduction, hip extension: without external support: 10x R and L, each direction, needed min A with hip extension Standing narrow BOS: biceps curls: 5lbs 10x R and L, standing OH military press: 5lbs 10x R and L Gait: ambulating without AD: 115'  Bwd walking: 4 x 10' cues for equal step length, looking down Lateral steps: 2 x 10' each way    Access Code: [email or web address] URL: [email or web address] Date: 05/24/2019 Prepared by: Lavone Nian  Exercises Sit to Stand with Arms Crossed - 2 x daily - 7 x weekly - 2 sets - 10 reps Standing Hip Flexion March - 2 x daily - 7 x weekly - 2 sets - 10 reps Standing Hip Abduction - 2 x daily - 7 x weekly - 2 sets - 10 reps Standing Hip Extension - 2 x daily - 7 x weekly - 2 sets - 10 reps                  PT Short Term Goals - 05/10/19 0916      PT SHORT TERM GOAL #1   Title  Patient will be able to ambulate 200 feet with cane or no AD for  200 feet to improve household ambulation    Baseline  2 ww walker    Time  3    Period  Weeks    Status  New    Target Date  05/31/19      PT SHORT TERM GOAL #2   Title  Pt will be able to perform 5 sit to stands without use of UE for support to improve functional strength    Baseline  must use bil hands for support    Time  3    Period  Weeks    Status  New    Target Date  05/31/19      PT SHORT TERM GOAL #3   Title  Patient will be able to go up and down curb and ramp with cane of no AD to improve comminty navigation        PT Long Term Goals - 05/10/19 0918      PT LONG TERM GOAL #1   Title  Pt will be able to ambulate with cane or lesser AD for >1500 feet to improve community ambulation.    Baseline  must use 2 ww walker    Time  8    Period  Weeks    Status  New    Target Date  07/05/19      PT LONG TERM GOAL #2   Title  Pt will be able to stand/walk for >1 hour to improve ability to go shopping    Baseline  5-10 min    Time   8    Period  Weeks    Status  New    Target Date  07/05/19      PT LONG TERM GOAL #3   Title  Pt will demo 56/56 on BBS to improve overall balance and reduce fall risk    Baseline  52/56    Time  8    Period  Weeks    Status  New    Target Date  07/05/19            Plan - 05/24/19 1518    Clinical Impression Statement  Pt tolerated session well. Pt had difficulty with maintaining balance while performing SLS and hip extension. Pt requires frequent rest breaks due to faigue.    Personal Factors and Comorbidities  Comorbidity 2;Time since onset of injury/illness/exacerbation;Transportation    Comorbidities  Hx of CVA, Gangerene of toes    Examination-Activity Limitations  Bathing;Hygiene/Grooming;Geophysical data processor for Others    Examination-Participation Restrictions  Community Activity;Driving;Laundry;Yard Work;Shop;Meal Prep    Stability/Clinical Decision Making  Evolving/Moderate complexity    Rehab Potential  Good    PT Frequency  1x / week    PT Duration  8 weeks    PT Treatment/Interventions  ADLs/Self Care Home Management;Gait training;Stair training;Functional mobility training;Therapeutic exercise;Therapeutic activities;Balance training;Patient/family education;Orthotic Fit/Training;Passive range of motion;Manual techniques;Energy conservation;Visual/perceptual remediation/compensation;Joint Manipulations    PT Next Visit Plan  Review HEP, assess to see if they are trying to walk outside couple of times a day, Gait training with st. cane, ramp and curb and stairs, develop a  generalized HEP    PT Home Exercise Plan  Access Code: [email or web address]: [email or web address]: 04/26/2021Prepared by: Theone Murdoch PatelExercisesStanding Gastroc Stretch on Foam 1/2 Roll - 3 x daily - 7 x weekly - 1 sets - 3 reps - 30 hold    Consulted and Agree with Plan of Care  Patient;Family member/caregiver    Family Member Consulted  Wife  Patient will benefit from skilled  therapeutic intervention in order to improve the following deficits and impairments:  Abnormal gait, Decreased balance, Decreased mobility, Decreased endurance, Decreased range of motion, Decreased strength, Difficulty walking, Impaired sensation, Pain  Visit Diagnosis: Muscle weakness (generalized)  Unsteadiness on feet  Other abnormalities of gait and mobility     Problem List Patient Active Problem List   Diagnosis Date Noted  . Dependent on walker for ambulation 04/30/2019  . Right hemiparesis ([place]) 04/30/2019  . Tachycardia 04/30/2019  . Acute blood loss anemia   . Uncontrolled type 2 diabetes mellitus with hyperglycemia ([place])   . Dry gangrene ([place])   . Leukocytosis   . Embolic stroke ([place]) 57/32/2025  . Status post tracheostomy ([place])   . Pressure injury of skin 03/02/2019  . On mechanically assisted ventilation ([place])   . Goals of care, counseling/discussion   . Palliative care encounter   . ARDS (adult respiratory distress syndrome) ([place])   . Cerebral embolism with cerebral infarction 02/17/2019  . Acute respiratory failure with hypoxia ([place])   . Pneumothorax, right     Kerrie Pleasure 05/24/2019, 3:40 PM  [facility] [address] [place], [place], [address] Phone: [phone]   Fax:  [phone]  Name: Andrew Bruce MRN: [id number] Date of Birth: 1965-04-11

## 2019-05-25 ENCOUNTER — Ambulatory Visit: Payer: Medicaid Other

## 2019-05-27 ENCOUNTER — Ambulatory Visit: Payer: Medicaid Other | Admitting: Occupational Therapy

## 2019-05-27 ENCOUNTER — Ambulatory Visit: Payer: Medicaid Other

## 2019-05-27 ENCOUNTER — Other Ambulatory Visit: Payer: Self-pay

## 2019-05-27 DIAGNOSIS — R208 Other disturbances of skin sensation: Secondary | ICD-10-CM

## 2019-05-27 DIAGNOSIS — R278 Other lack of coordination: Secondary | ICD-10-CM

## 2019-05-27 DIAGNOSIS — R2681 Unsteadiness on feet: Secondary | ICD-10-CM

## 2019-05-27 DIAGNOSIS — M6281 Muscle weakness (generalized): Secondary | ICD-10-CM

## 2019-05-27 NOTE — Therapy (Signed)
[facility] [address] [place], [place], [address] Phone: [phone]   Fax:  [phone]  Occupational Therapy Treatment  Patient Details  Name: Andrew Bruce MRN: [id number] Date of Birth: 1965-10-23 Referring Provider (OT): Delle Reining PA-C   Encounter Date: 05/27/2019  OT End of Session - 05/27/19 1201    Visit Number  2    Number of Visits  17    Date for OT Re-Evaluation  07/10/19    Authorization Type  ? MCD pending - pt did NOT have MCD at time of evaluation    OT Start Time  1155   pt late, not arrived until late   OT Stop Time  1230    OT Time Calculation (min)  35 min    Activity Tolerance  Patient tolerated treatment well    Behavior During Therapy  [facility] for tasks assessed/performed       Past Medical History:  Diagnosis Date  . COVID-19 02/06/2019    Past Surgical History:  Procedure Laterality Date  . DIRECT LARYNGOSCOPY Bilateral 04/06/2019   Procedure: MICRO DIRECT LARYNGOSCOPY WITH PROLARYN INJECTION;  Surgeon: Christia Reading, MD;  Location: [facility] OR;  Service: ENT;  Laterality: Bilateral;  . IR GASTROSTOMY TUBE MOD SED  03/10/2019  . IR GASTROSTOMY TUBE REMOVAL  04/20/2019    There were no vitals filed for this visit.  Subjective Assessment - 05/27/19 1244    Currently in Pain?  No/denies                           OT Treatment/ Education - 05/27/19 1224    Education Details  beginning coordination HEP, red putty HEP, 10 -20 reps each, see pt instructions.    Person(s) Educated  Patient;Child(ren)   dtr interpreted for pt.   Methods  Explanation;Demonstration;Handout;Verbal cues   dtr to translate handout for pt.   Comprehension  Verbalized understanding;Returned demonstration;Verbal cues required       OT Short Term Goals - 05/10/19 1404      OT SHORT TERM GOAL #1   Title  Independent with HEP for RT hand strength and coordination    Baseline  dependent - not yet  issued    Time  4    Period  Weeks    Status  New      OT SHORT TERM GOAL #2   Title  Pt to be supervision only for bathing while seated    Baseline  dependent    Time  4    Period  Weeks    Status  New      OT SHORT TERM GOAL #3   Title  Pt to perform 10 min of activity with only 1 rest break and no significant change in HR    Baseline  Pt gets SOB and requires frequent rest breaks    Time  4    Period  Weeks    Status  New      OT SHORT TERM GOAL #4   Title  Pt to improve grip strength Rt hand to 30 lbs, and Lt hand to 45 lbs    Baseline  Rt = 20, Lt = 38 lbs    Time  4    Period  Weeks    Status  New      OT SHORT TERM GOAL #5   Title  Improve coordination as evidenced by performing 9 hole peg test in 50  sec or less Rt dominant hand    Baseline  Rt = 63.94 sec    Time  4    Period  Weeks    Status  New        OT Long Term Goals - 05/10/19 1406      OT LONG TERM GOAL #1   Title  Independent with BUE HEP for strength and endurance    Baseline  dependent - not yet issued    Time  8    Period  Weeks    Status  New      OT LONG TERM GOAL #2   Title  Pt to return to simple cooking tasks for 15 min or greater at mod I level    Baseline  dependent    Time  8    Period  Weeks    Status  New      OT LONG TERM GOAL #3   Title  Pt to return to light household work (taking out trash, washing dishes, etc) mod I level without rest breaks    Baseline  dependent    Time  8    Period  Weeks    Status  New      OT LONG TERM GOAL #4   Title  Pt to improve grip strength RT dominant hand to 40 lbs or greater    Baseline  Rt = 20 lbs    Time  8    Period  Weeks    Status  New      OT LONG TERM GOAL #5   Title  Pt to improve coordination as evidenced by performing 9 hole peg test in 40 sec or less Rt hand    Baseline  63.94 sec    Time  8    Period  Weeks    Status  New            Plan - 05/27/19 1213    Clinical Impression Statement  Pt is progressing  towards goals. He demonstrates understanding of HEP for coordination.    OT Occupational Profile and History  Detailed Assessment- Review of Records and additional review of physical, cognitive, psychosocial history related to current functional performance    Occupational performance deficits (Please refer to evaluation for details):  ADL's;IADL's;Work;Leisure    Body Structure / Function / Physical Skills  ADL;ROM;IADL;Endurance;Body mechanics;Sensation;Mobility;Flexibility;Strength;Coordination;FMC;Pain;UE functional use;Decreased knowledge of use of DME    Rehab Potential  Good    Clinical Decision Making  Several treatment options, min-mod task modification necessary    Comorbidities Affecting Occupational Performance:  Presence of comorbidities impacting occupational performance    Comorbidities impacting occupational performance description:  Gangrenous toes, endurance/HR affected from covid    Modification or Assistance to Complete Evaluation   Min-Moderate modification of tasks or assist with assess necessary to complete eval    OT Frequency  2x / week    OT Duration  8 weeks   plus eval. May need to adjust frequency/duration if pt becomes MCD   OT Treatment/Interventions  Self-care/ADL training;Therapeutic exercise;Functional Mobility Training;Aquatic Therapy;Neuromuscular education;Manual Therapy;Energy conservation;Therapeutic activities;Coping strategies training;DME and/or AE instruction;Cognitive remediation/compensation;Visual/perceptual remediation/compensation;Moist Heat;Passive range of motion;Patient/family education    Plan  check on how HEP is going, NMR for RUE    Consulted and Agree with Plan of Care  Patient;Family member/caregiver    Family Member Consulted  wife       Patient will benefit from skilled therapeutic intervention in order to improve the following  deficits and impairments:   Body Structure / Function / Physical Skills: ADL, ROM, IADL, Endurance, Body  mechanics, Sensation, Mobility, Flexibility, Strength, Coordination, FMC, Pain, UE functional use, Decreased knowledge of use of DME       Visit Diagnosis: Muscle weakness (generalized)  Unsteadiness on feet  Other lack of coordination  Other disturbances of skin sensation    Problem List Patient Active Problem List   Diagnosis Date Noted  . Dependent on walker for ambulation 04/30/2019  . Right hemiparesis (HCC) 04/30/2019  . Tachycardia 04/30/2019  . Acute blood loss anemia   . Uncontrolled type 2 diabetes mellitus with hyperglycemia (HCC)   . Dry gangrene (HCC)   . Leukocytosis   . 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 Status post tracheostomy (HCC)   . Pressure injury of skin 03/02/2019  . On mechanically assisted ventilation (HCC)   . Goals of care, counseling/discussion   . Palliative care encounter   . ARDS (adult respiratory distress syndrome) (HCC)   . Cerebral embolism with cerebral infarction 02/17/2019  . Acute respiratory failure with hypoxia (HCC)   . Pneumothorax, right     Bowe Sidor 05/27/2019, 12:45 PM  [facility] [address] [place], [place], [address] Phone: [phone]   Fax:  [phone]  Name: Keven Osborn MRN: [id number] Date of Birth: 02-11-1965

## 2019-05-27 NOTE — Patient Instructions (Addendum)
  Coordination Activities  Perform the following activities for 20 minutes 1 times per day with right hand(s).   Flip cards 1 at a time as fast as you can.  Deal cards with your thumb (Hold deck in hand and push card off top with thumb).  Shuffle cards.  Pick up coins and place in container or coin bank.  Pick up coins and stack.  Pick up coins one at a time until you get 5-10 in your hand, then move coins from palm to fingertips to place in container    1. Grip Strengthening (Resistive Putty)   Squeeze putty using thumb and all fingers. Repeat _20___ times. Do __2__ sessions per day.   2. Roll putty into tube on table and pinch between each finger and thumb x 10 reps each. (can do ring and small finger together)     Copyright  VHI. All rights reserved.

## 2019-06-01 ENCOUNTER — Other Ambulatory Visit: Payer: Self-pay

## 2019-06-01 ENCOUNTER — Ambulatory Visit: Payer: Medicaid Other | Admitting: Speech Pathology

## 2019-06-01 ENCOUNTER — Ambulatory Visit: Payer: Medicaid Other

## 2019-06-01 DIAGNOSIS — R2689 Other abnormalities of gait and mobility: Secondary | ICD-10-CM

## 2019-06-01 DIAGNOSIS — R2681 Unsteadiness on feet: Secondary | ICD-10-CM

## 2019-06-01 DIAGNOSIS — M6281 Muscle weakness (generalized): Secondary | ICD-10-CM

## 2019-06-01 DIAGNOSIS — R490 Dysphonia: Secondary | ICD-10-CM

## 2019-06-01 NOTE — Therapy (Signed)
[facility] [address] [place], [place], [address] Phone: [phone]   Fax:  [phone]  Physical Therapy Treatment  Patient Details  Name: Andrew Bruce MRN: [id number] Date of Birth: July 04, 1965 Referring Provider (PT): Reesa Chew, [place]   Encounter Date: 06/01/2019  PT End of Session - 06/01/19 1053    Visit Number  3    Number of Visits  9    Date for PT Re-Evaluation  07/05/19    Authorization Type  Eval: 05/10/19 (medicaid)    Progress Note Due on Visit  8    PT Start Time  1015    PT Stop Time  1100    PT Time Calculation (min)  45 min    Equipment Utilized During Treatment  Gait belt    Activity Tolerance  Patient tolerated treatment well    Behavior During Therapy  [facility] for tasks assessed/performed       Past Medical History:  Diagnosis Date  . COVID-19 02/06/2019    Past Surgical History:  Procedure Laterality Date  . DIRECT LARYNGOSCOPY Bilateral 04/06/2019   Procedure: MICRO DIRECT LARYNGOSCOPY WITH PROLARYN INJECTION;  Surgeon: Melida Quitter, MD;  Location: [facility];  Service: ENT;  Laterality: Bilateral;  . IR GASTROSTOMY TUBE MOD SED  03/10/2019  . IR GASTROSTOMY TUBE REMOVAL  04/20/2019    There were no vitals filed for this visit.  Subjective Assessment - 06/01/19 1018    Subjective  Pt reports he is walking 35 min, 3x/day. Walking more makes the toes more numb.    Patient is accompained by:  Interpreter    Pertinent History  COVID in patient stay, hx of CVA, hx of PE, gangerene of toes    Pain Score  4     Pain Location  Toe (Comment which one)    Pain Orientation  Right;Left    Pain Descriptors / Indicators  Aching              treatment: 5x sit to stand: 23 seconds Sit to stand: 15lbs KB: 2 x 10 Gait training: ambulated 1025' without AD with walking 300 feet in grass Ramp and curb: 3x, cues to make sure he looks down and not stub his toes Standing on airex narrow BOS: biceps  curls: 10lbs 15x R and L                  PT Education - 06/01/19 1052    Education Details  Pt educated to walk 35 min without using cane (he can hold the cane in hand but not use it) and making sure family member is there with him.    Person(s) Educated  Patient   Interpreter   Methods  Explanation    Comprehension  Verbalized understanding       PT Short Term Goals - 06/01/19 1019      PT SHORT TERM GOAL #1   Title  Patient will be able to ambulate 200 feet with cane or no AD for 200 feet to improve household ambulation    Baseline  2 ww walker (eval); 220 feet without AD (06/01/19)    Time  3    Period  Weeks    Status  Achieved    Target Date  05/31/19      PT SHORT TERM GOAL #2   Title  Pt will be able to perform 5 sit to stands without use of UE for support to improve functional strength  Baseline  must use bil hands for support (Eval); 5x sit to stand in 23 seconds    Time  3    Period  Weeks    Status  Achieved    Target Date  05/31/19      PT SHORT TERM GOAL #3   Title  Patient will be able to go up and down curb and ramp with cane of no AD to improve comminty navigation    Baseline  Pt able to go up and down ramp and curb: 3x with SBA    Time  3    Period  Weeks    Status  Achieved    Target Date  05/31/19        PT Long Term Goals - 06/01/19 1029      PT LONG TERM GOAL #1   Title  Pt will be able to ambulate with cane or lesser AD for >1500 feet to improve community ambulation.    Baseline  must use 2 ww walker, 1025' without AD (06/01/19)    Time  8    Period  Weeks    Status  Partially Met    Target Date  07/05/19      PT LONG TERM GOAL #2   Title  Pt will be able to stand/walk for >1 hour to improve ability to go shopping    Baseline  5-10 min (eval); 30 min (06/01/19)    Time  8    Period  Weeks    Status  New    Target Date  07/05/19      PT LONG TERM GOAL #3   Title  Pt will demo 56/56 on BBS to improve overall balance and  reduce fall risk    Baseline  52/56    Time  8    Period  Weeks    Status  New    Target Date  07/05/19            Plan - 06/01/19 1048    Clinical Impression Statement  Pt has met all of his short term goals and making good progress towards his long term functional goals. Patient is demonstrating improving walking endurance.    Personal Factors and Comorbidities  Comorbidity 2;Time since onset of injury/illness/exacerbation;Transportation    Comorbidities  Hx of CVA, Gangerene of toes    Examination-Activity Limitations  Bathing;Hygiene/Grooming;Patent attorney for Others    Examination-Participation Restrictions  Community Activity;Driving;Laundry;Yard Work;Shop;Meal Prep    Stability/Clinical Decision Making  Evolving/Moderate complexity    Rehab Potential  Good    PT Frequency  1x / week    PT Duration  8 weeks    PT Treatment/Interventions  ADLs/Self Care Home Management;Gait training;Stair training;Functional mobility training;Therapeutic exercise;Therapeutic activities;Balance training;Patient/family education;Orthotic Fit/Training;Passive range of motion;Manual techniques;Energy conservation;Visual/perceptual remediation/compensation;Joint Manipulations    PT Next Visit Plan  Review HEP, assess to see if they are trying to walk outside couple of times a day, Gait training with st. cane, ramp and curb and stairs, develop a  generalized HEP    PT Home Exercise Plan  Access Code: [email or web address]: [email or web address]: 04/26/2021Prepared by: Gwenyth Bouillon PatelExercisesStanding Gastroc Stretch on Foam 1/2 Roll - 3 x daily - 7 x weekly - 1 sets - 3 reps - 30 hold    Consulted and Agree with Plan of Care  Patient;Family member/caregiver    Family Member Consulted  Wife       Patient will benefit from skilled therapeutic intervention in order to improve the following  deficits and impairments:  Abnormal gait, Decreased balance, Decreased mobility, Decreased endurance,  Decreased range of motion, Decreased strength, Difficulty walking, Impaired sensation, Pain  Visit Diagnosis: Muscle weakness (generalized)  Unsteadiness on feet  Other abnormalities of gait and mobility     Problem List Patient Active Problem List   Diagnosis Date Noted  . Dependent on walker for ambulation 04/30/2019  . Right hemiparesis ([place]) 04/30/2019  . Tachycardia 04/30/2019  . Acute blood loss anemia   . Uncontrolled type 2 diabetes mellitus with hyperglycemia ([place])   . Dry gangrene ([place])   . Leukocytosis   . Embolic stroke ([place]) 15/94/5859  . Status post tracheostomy ([place])   . Pressure injury of skin 03/02/2019  . On mechanically assisted ventilation ([place])   . Goals of care, counseling/discussion   . Palliative care encounter   . ARDS (adult respiratory distress syndrome) ([place])   . Cerebral embolism with cerebral infarction 02/17/2019  . Acute respiratory failure with hypoxia ([place])   . Pneumothorax, right     Kerrie Pleasure, PT 06/01/2019, 11:00 AM  [facility] [address] [place], [place], [address] Phone: [phone]   Fax:  [phone]  Name: Andrew Bruce MRN: [id number] Date of Birth: 1965-10-06

## 2019-06-01 NOTE — Patient Instructions (Signed)
Respirar su abdominal   . Relahar los hombros . Su mano en el estomago . Inhale y sienta su Forensic scientist . Hacer a fuera y sienta su estomago relajado  Imagine su estomago como un globo. Cuando inhale, el globo sa se mas grande. Cuando el [employer], el globo se desinfla.   Acuestase boca arriba para practicar respirar por 10 minutos. Despues, sientese y mirese al espejo como respira el estomago a dentro y a fuera por 5 segundos. (15 minutos total, 2x al dia)

## 2019-06-01 NOTE — Therapy (Signed)
[facility] [address] [place], [place], [address] Phone: [phone]   Fax:  [phone]  Speech Language Pathology Treatment  Patient Details  Name: Andrew Bruce MRN: [id number] Date of Birth: 18-Jul-1965 Referring Provider (SLP): Andrew David, NP   Encounter Date: 06/01/2019  End of Session - 06/01/19 1314    Visit Number  2    Number of Visits  17    Date for SLP Re-Evaluation  08/10/19    SLP Start Time  0930    SLP Stop Time   6962    SLP Time Calculation (min)  45 min    Activity Tolerance  Patient tolerated treatment well       Past Medical History:  Diagnosis Date  . COVID-19 02/06/2019    Past Surgical History:  Procedure Laterality Date  . DIRECT LARYNGOSCOPY Bilateral 04/06/2019   Procedure: MICRO DIRECT LARYNGOSCOPY WITH PROLARYN INJECTION;  Surgeon: Andrew Quitter, MD;  Location: [facility];  Service: ENT;  Laterality: Bilateral;  . IR GASTROSTOMY TUBE MOD SED  03/10/2019  . IR GASTROSTOMY TUBE REMOVAL  04/20/2019    There were no vitals filed for this visit.  Subjective Assessment - 06/01/19 0935    Subjective  "So so, a little better."    Patient is accompained by:  Interpreter   Andrew Bruce, interpreter   Currently in Pain?  No/denies            ADULT SLP TREATMENT - 06/01/19 0937      General Information   Behavior/Cognition  Alert;Cooperative;Pleasant mood      Treatment Provided   Treatment provided  Cognitive-Linquistic      Pain Assessment   Pain Assessment  No/denies pain      Cognitive-Linquistic Treatment   Treatment focused on  Voice;Patient/family/caregiver education    Skilled Treatment  SLP reviewed pt's history and notes in care everywhere; reviewed ENT report from Dr. Redmond Bruce on 04/29/19 which showed left vocal cord paralysis, glottic stenosis. SLP discussed these findings with pt and explained via interpreter how the vocal cords function. Introduced abdominal breathing,  which pt was unable to achieve in upright position so took pt to therapy mat in gym for further instruction. Achieved in supine with initial min A; gradually added automatic speech and simple responses while in supine to assist with respiration/phonation coordination. Progressed to upright usual mod verbal and tactile cues for AB at rest, fading to occasional min A. Coordinated with speech tasks with occasional min A, 90% success. Pt agreed vocal quality improved when using AB and increased intensity.      Assessment / Recommendations / Plan   Plan  Continue with current plan of care      Progression Toward Goals   Progression toward goals  Progressing toward goals       SLP Education - 06/01/19 1314    Education Details  abdominal breathing    Person(s) Educated  Patient    Methods  Explanation    Comprehension  Verbalized understanding         SLP Long Term Goals - 06/01/19 1316      SLP LONG TERM GOAL #1   Title  pt will report following 3 aspects of vocal conservation program between 4 sessions    Baseline  no aspects    Time  8    Period  Weeks   or visits, for all LTGs   Status  On-going      SLP LONG TERM GOAL #2  Title  pt will perform any HEP for voice, if deemed appropriate following ENT consult    Baseline  not provided yet    Time  8    Period  Weeks    Status  On-going      SLP LONG TERM GOAL #3   Title  pt will use abdominal breathing in 5 minutes simple conversation in 3 sessions    Baseline  chest breathing    Time  8    Period  Weeks    Status  On-going      SLP LONG TERM GOAL #4   Title  pt will use WNL vocal effort without strain/pushing for vocalization with rare min A during 3 ST sessions    Baseline  strainedpushed until SPL told pt not to do so    Time  8    Period  Weeks    Status  On-going      SLP LONG TERM GOAL #5   Title  pt will rate his voice at least 7/10 (where 10=WNL voice)    Baseline  5/10    Time  8    Period  Weeks     Status  On-going       Plan - 06/01/19 1315    Clinical Impression Statement  Pt presents with mod hoarseness (R49.0)  of unclear etiology. 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 Today SLP trained pt in abdominal breathing. May benefit from exercises for vocal fold adduction; will need to amend/add these to treatment plan prior to assigning to pt. SLP believes pt will benefit from at least x1/week ST for vocal hygiene and educton/training on abdominal breathing to maximize breath support for speech.    Speech Therapy Frequency  1x /week    Duration  --   8 weeks   Treatment/Interventions  Functional tasks;Internal/external aids;Patient/family education;Compensatory strategies;SLP instruction and feedback;Other (comment)   vocal hygiene program   Potential to Achieve Goals  Fair   tentative potential- without ENT consult difficult to ascertain   SLP Home Exercise Plan  provided today    Consulted and Agree with Plan of Care  Patient       Patient will benefit from skilled therapeutic intervention in order to improve the following deficits and impairments:   Hoarseness of voice    Problem List Patient Active Problem List   Diagnosis Date Noted  . Dependent on walker for ambulation 04/30/2019  . Right hemiparesis (HCC) 04/30/2019  . Tachycardia 04/30/2019  . Acute blood loss anemia   . Uncontrolled type 2 diabetes mellitus with hyperglycemia (HCC)   . Dry gangrene (HCC)   . Leukocytosis   . 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 Status post tracheostomy (HCC)   . Pressure injury of skin 03/02/2019  . On mechanically assisted ventilation (HCC)   . Goals of care, counseling/discussion   . Palliative care encounter   . ARDS (adult respiratory distress syndrome) (HCC)   . Cerebral embolism with cerebral infarction 02/17/2019  . Acute respiratory failure with hypoxia (HCC)   . Pneumothorax, right    Andrew Baton, MS, CCC-SLP Speech-Language Pathologist  Arlana Lindau 06/01/2019, 1:17 PM  [facility] [address] [place], [place], [address] Phone: [phone]   Fax:  [phone]   Name: Andrew Bruce MRN: [id number] Date of Birth: 1965-10-09

## 2019-06-03 ENCOUNTER — Encounter: Payer: Self-pay | Admitting: Occupational Therapy

## 2019-06-03 ENCOUNTER — Other Ambulatory Visit: Payer: Self-pay

## 2019-06-03 ENCOUNTER — Ambulatory Visit: Payer: Medicaid Other | Admitting: Occupational Therapy

## 2019-06-03 VITALS — HR 102

## 2019-06-03 DIAGNOSIS — M6281 Muscle weakness (generalized): Secondary | ICD-10-CM | POA: Diagnosis not present

## 2019-06-03 DIAGNOSIS — R278 Other lack of coordination: Secondary | ICD-10-CM

## 2019-06-03 DIAGNOSIS — R208 Other disturbances of skin sensation: Secondary | ICD-10-CM

## 2019-06-03 NOTE — Therapy (Signed)
[facility] [address] [place], [place], [address] Phone: [phone]   Fax:  [phone]  Occupational Therapy Treatment  Patient Details  Name: Andrew Bruce MRN: [id number] Date of Birth: 1965/10/05 Referring Provider (OT): Reesa Chew PA-C   Encounter Date: 06/03/2019  OT End of Session - 06/03/19 1539    Visit Number  3    Number of Visits  17    Date for OT Re-Evaluation  07/10/19    Authorization Type  ? MCD pending - pt did NOT have MCD at time of evaluation    OT Start Time  1537    OT Stop Time  1615    OT Time Calculation (min)  38 min    Activity Tolerance  Patient tolerated treatment well    Behavior During Therapy  WFL for tasks assessed/performed       Past Medical History:  Diagnosis Date  . COVID-19 02/06/2019    Past Surgical History:  Procedure Laterality Date  . DIRECT LARYNGOSCOPY Bilateral 04/06/2019   Procedure: MICRO DIRECT LARYNGOSCOPY WITH PROLARYN INJECTION;  Surgeon: Melida Quitter, MD;  Location: [facility];  Service: ENT;  Laterality: Bilateral;  . IR GASTROSTOMY TUBE MOD SED  03/10/2019  . IR GASTROSTOMY TUBE REMOVAL  04/20/2019    Vitals:   06/03/19 1613  Pulse: (!) 107  SpO2: 98%    Subjective Assessment - 06/03/19 1539    Subjective   shoulder pain only with malpositioning    Currently in Pain?  No/denies             Treatment: Placing medium pegs into pegboard with RUE, min-mod difficulty and min v.c Standing to place graded clothespins on vertical antennae with RUE, for sustained pinch then removing min v.c               OT Education - 06/03/19 1611    Education Details  Supine cane ex for shoulder flexion, chest press, then seated unilateral shoulder flexion low range holding cane, and closed chanin chest press with cane 10-20 reps each, min facilitation    Person(s) Educated  Patient;Child(ren)   dtr interpreted for pt.   Methods   Explanation;Demonstration;Verbal cues   dtr to translate handout for pt.   Comprehension  Verbalized understanding;Returned demonstration;Verbal cues required       OT Short Term Goals - 05/10/19 1404      OT SHORT TERM GOAL #1   Title  Independent with HEP for RT hand strength and coordination    Baseline  dependent - not yet issued    Time  4    Period  Weeks    Status  New      OT SHORT TERM GOAL #2   Title  Pt to be supervision only for bathing while seated    Baseline  dependent    Time  4    Period  Weeks    Status  New      OT SHORT TERM GOAL #3   Title  Pt to perform 10 min of activity with only 1 rest break and no significant change in HR    Baseline  Pt gets SOB and requires frequent rest breaks    Time  4    Period  Weeks    Status  New      OT SHORT TERM GOAL #4   Title  Pt to improve grip strength Rt hand to 30 lbs, and Lt hand to 45 lbs  Baseline  Rt = 20, Lt = 38 lbs    Time  4    Period  Weeks    Status  New      OT SHORT TERM GOAL #5   Title  Improve coordination as evidenced by performing 9 hole peg test in 50 sec or less Rt dominant hand    Baseline  Rt = 63.94 sec    Time  4    Period  Weeks    Status  New        OT Long Term Goals - 05/10/19 1406      OT LONG TERM GOAL #1   Title  Independent with BUE HEP for strength and endurance    Baseline  dependent - not yet issued    Time  8    Period  Weeks    Status  New      OT LONG TERM GOAL #2   Title  Pt to return to simple cooking tasks for 15 min or greater at mod I level    Baseline  dependent    Time  8    Period  Weeks    Status  New      OT LONG TERM GOAL #3   Title  Pt to return to light household work (taking out trash, washing dishes, etc) mod I level without rest breaks    Baseline  dependent    Time  8    Period  Weeks    Status  New      OT LONG TERM GOAL #4   Title  Pt to improve grip strength RT dominant hand to 40 lbs or greater    Baseline  Rt = 20 lbs    Time   8    Period  Weeks    Status  New      OT LONG TERM GOAL #5   Title  Pt to improve coordination as evidenced by performing 9 hole peg test in 40 sec or less Rt hand    Baseline  63.94 sec    Time  8    Period  Weeks    Status  New            Plan - 06/03/19 1613    Clinical Impression Statement  Pt is progressing towards goals. He demonstrates understanding of HEP for cane exercise.    OT Occupational Profile and History  Detailed Assessment- Review of Records and additional review of physical, cognitive, psychosocial history related to current functional performance    Occupational performance deficits (Please refer to evaluation for details):  ADL's;IADL's;Work;Leisure    Body Structure / Function / Physical Skills  ADL;ROM;IADL;Endurance;Body mechanics;Sensation;Mobility;Flexibility;Strength;Coordination;FMC;Pain;UE functional use;Decreased knowledge of use of DME    Rehab Potential  Good    Clinical Decision Making  Several treatment options, min-mod task modification necessary    Comorbidities Affecting Occupational Performance:  Presence of comorbidities impacting occupational performance    Comorbidities impacting occupational performance description:  Gangrenous toes, endurance/HR affected from covid    Modification or Assistance to Complete Evaluation   Min-Moderate modification of tasks or assist with assess necessary to complete eval    OT Frequency  2x / week    OT Duration  8 weeks   plus eval. May need to adjust frequency/duration if pt becomes MCD   OT Treatment/Interventions  Self-care/ADL training;Therapeutic exercise;Functional Mobility Training;Aquatic Therapy;Neuromuscular education;Manual Therapy;Energy conservation;Therapeutic activities;Coping strategies training;DME and/or AE instruction;Cognitive remediation/compensation;Visual/perceptual remediation/compensation;Moist Heat;Passive range of motion;Patient/family education  Plan  NMR for RUE    Consulted and  Agree with Plan of Care  Patient;Family member/caregiver    Family Member Consulted  wife       Patient will benefit from skilled therapeutic intervention in order to improve the following deficits and impairments:   Body Structure / Function / Physical Skills: ADL, ROM, IADL, Endurance, Body mechanics, Sensation, Mobility, Flexibility, Strength, Coordination, FMC, Pain, UE functional use, Decreased knowledge of use of DME       Visit Diagnosis: Other lack of coordination  Other disturbances of skin sensation  Muscle weakness (generalized)    Problem List Patient Active Problem List   Diagnosis Date Noted  . Dependent on walker for ambulation 04/30/2019  . Right hemiparesis (HCC) 04/30/2019  . Tachycardia 04/30/2019  . Acute blood loss anemia   . Uncontrolled type 2 diabetes mellitus with hyperglycemia (HCC)   . Dry gangrene (HCC)   . Leukocytosis   . 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 Status post tracheostomy (HCC)   . Pressure injury of skin 03/02/2019  . On mechanically assisted ventilation (HCC)   . Goals of care, counseling/discussion   . Palliative care encounter   . ARDS (adult respiratory distress syndrome) (HCC)   . Cerebral embolism with cerebral infarction 02/17/2019  . Acute respiratory failure with hypoxia (HCC)   . Pneumothorax, right     RINE,KATHRYN 06/03/2019, 4:14 PM  [facility] [address] [place], [place], [address] Phone: [phone]   Fax:  [phone]  Name: Andrew Bruce MRN: [id number] Date of Birth: [birth year]

## 2019-06-04 ENCOUNTER — Ambulatory Visit: Payer: Self-pay

## 2019-06-04 ENCOUNTER — Encounter: Payer: Self-pay | Admitting: Occupational Therapy

## 2019-06-08 ENCOUNTER — Ambulatory Visit: Payer: Medicaid Other | Admitting: Occupational Therapy

## 2019-06-08 ENCOUNTER — Other Ambulatory Visit: Payer: Self-pay

## 2019-06-08 ENCOUNTER — Ambulatory Visit: Payer: Medicaid Other

## 2019-06-08 ENCOUNTER — Ambulatory Visit: Payer: Medicaid Other | Admitting: Speech Pathology

## 2019-06-08 DIAGNOSIS — I639 Cerebral infarction, unspecified: Secondary | ICD-10-CM

## 2019-06-08 DIAGNOSIS — R278 Other lack of coordination: Secondary | ICD-10-CM

## 2019-06-08 DIAGNOSIS — R498 Other voice and resonance disorders: Secondary | ICD-10-CM

## 2019-06-08 DIAGNOSIS — G8191 Hemiplegia, unspecified affecting right dominant side: Secondary | ICD-10-CM

## 2019-06-08 DIAGNOSIS — R1312 Dysphagia, oropharyngeal phase: Secondary | ICD-10-CM

## 2019-06-08 DIAGNOSIS — M6281 Muscle weakness (generalized): Secondary | ICD-10-CM | POA: Diagnosis not present

## 2019-06-08 DIAGNOSIS — R2681 Unsteadiness on feet: Secondary | ICD-10-CM

## 2019-06-08 DIAGNOSIS — J3800 Paralysis of vocal cords and larynx, unspecified: Secondary | ICD-10-CM

## 2019-06-08 NOTE — Therapy (Signed)
[facility] [address] [place], [place], [address] Phone: [phone]   Fax:  [phone]  Occupational Therapy Treatment  Patient Details  Name: Andrew Bruce MRN: [id number] Date of Birth: 09-24-1965 Referring Provider (OT): Reesa Chew PA-C   Encounter Date: 06/08/2019  OT End of Session - 06/08/19 1407    Visit Number  4    Number of Visits  17    Date for OT Re-Evaluation  07/10/19    Authorization Type  ? MCD pending - pt did NOT have MCD at time of evaluation    OT Start Time  1230    OT Stop Time  1315    OT Time Calculation (min)  45 min    Activity Tolerance  Patient tolerated treatment well    Behavior During Therapy  WFL for tasks assessed/performed       Past Medical History:  Diagnosis Date  . COVID-19 02/06/2019    Past Surgical History:  Procedure Laterality Date  . DIRECT LARYNGOSCOPY Bilateral 04/06/2019   Procedure: MICRO DIRECT LARYNGOSCOPY WITH PROLARYN INJECTION;  Surgeon: Melida Quitter, MD;  Location: [facility];  Service: ENT;  Laterality: Bilateral;  . IR GASTROSTOMY TUBE MOD SED  03/10/2019  . IR GASTROSTOMY TUBE REMOVAL  04/20/2019    There were no vitals filed for this visit.  Subjective Assessment - 06/08/19 1231    Pertinent History  CVA w/ mild Rt hemiparesis secondary to COVID and ARF Jan 2021. Pt intubated 02/07/19 and trachecd 02/11/76 - ICU complicated by PE and multiple CVA'S, gangrene of toes Lt 1-4 and Rt 1, 2, 3, 5.    Limitations  no driving, no heavy lifting    Currently in Pain?  Yes   toes - O.T. not addressing   Pain Score  6     Pain Location  Shoulder    Pain Orientation  Right;Left    Pain Descriptors / Indicators  Aching    Pain Onset  More than a month ago    Pain Frequency  Intermittent    Aggravating Factors   mal positioning    Pain Relieving Factors  proper positioning                   OT Treatments/Exercises (OP) - 06/08/19 0001      ADLs    Writing  Practiced writing first name with and without built up foam with increased ease using built up pen - issued tan foam      Exercises   Exercises  Hand;Shoulder      Shoulder Exercises: ROM/Strengthening   UBE (Upper Arm Bike)  UBE x 5 min for UE conditioning/endurance, level 3. O2 sats dropped down to 85% after UBE but quickly went back up to 94%      Hand Exercises   Other Hand Exercises  Gripper set at level 1 resistance to pick up blocks Rt hand for sustained grip strength w/ min difficulty      Functional Reaching Activities   Mid Level  Mid level reaching RUE while standing on compliant surface for balance to copy peg design for coordination (using medium sized pegs d/t small pegs too difficult) w/ min drops and min assist to correct 2 mistakes. Pt standing for > 10 min. to place pegs. Pt then sat down w/ O2 = 94%, HR = 106. Pt stood again to remove pegs while incorporating reaching lower for dynamic standing  OT Short Term Goals - 06/08/19 1408      OT SHORT TERM GOAL #1   Title  Independent with HEP for RT hand strength and coordination    Baseline  dependent - not yet issued    Time  4    Period  Weeks    Status  Achieved      OT SHORT TERM GOAL #2   Title  Pt to be supervision only for bathing while seated    Baseline  dependent    Time  4    Period  Weeks    Status  Achieved   in standing w/ grab bar     OT SHORT TERM GOAL #3   Title  Pt to perform 10 min of activity with only 1 rest break and no significant change in HR    Baseline  Pt gets SOB and requires frequent rest breaks    Time  4    Period  Weeks    Status  On-going   Pt standing for 10 minutes with O2 sats 94%     OT SHORT TERM GOAL #4   Title  Pt to improve grip strength Rt hand to 30 lbs, and Lt hand to 45 lbs    Baseline  Rt = 20, Lt = 38 lbs    Time  4    Period  Weeks    Status  On-going      OT SHORT TERM GOAL #5   Title  Improve coordination as evidenced by  performing 9 hole peg test in 50 sec or less Rt dominant hand    Baseline  Rt = 63.94 sec    Time  4    Period  Weeks    Status  New        OT Long Term Goals - 05/10/19 1406      OT LONG TERM GOAL #1   Title  Independent with BUE HEP for strength and endurance    Baseline  dependent - not yet issued    Time  8    Period  Weeks    Status  New      OT LONG TERM GOAL #2   Title  Pt to return to simple cooking tasks for 15 min or greater at mod I level    Baseline  dependent    Time  8    Period  Weeks    Status  New      OT LONG TERM GOAL #3   Title  Pt to return to light household work (taking out trash, washing dishes, etc) mod I level without rest breaks    Baseline  dependent    Time  8    Period  Weeks    Status  New      OT LONG TERM GOAL #4   Title  Pt to improve grip strength RT dominant hand to 40 lbs or greater    Baseline  Rt = 20 lbs    Time  8    Period  Weeks    Status  New      OT LONG TERM GOAL #5   Title  Pt to improve coordination as evidenced by performing 9 hole peg test in 40 sec or less Rt hand    Baseline  63.94 sec    Time  8    Period  Weeks    Status  New  Plan - 06/08/19 1411    Clinical Impression Statement  Pt met STG's #1 and #2. Pt progressing with functional use of RUE and endurance.    Occupational performance deficits (Please refer to evaluation for details):  ADL's;IADL's;Work;Leisure    Body Structure / Function / Physical Skills  ADL;ROM;IADL;Endurance;Body mechanics;Sensation;Mobility;Flexibility;Strength;Coordination;FMC;Pain;UE functional use;Decreased knowledge of use of DME    Rehab Potential  Good    OT Frequency  2x / week    OT Duration  8 weeks    OT Treatment/Interventions  Self-care/ADL training;Therapeutic exercise;Functional Mobility Training;Aquatic Therapy;Neuromuscular education;Manual Therapy;Energy conservation;Therapeutic activities;Coping strategies training;DME and/or AE instruction;Cognitive  remediation/compensation;Visual/perceptual remediation/compensation;Moist Heat;Passive range of motion;Patient/family education    Plan  NMR for RUE    Consulted and Agree with Plan of Care  Patient       Patient will benefit from skilled therapeutic intervention in order to improve the following deficits and impairments:   Body Structure / Function / Physical Skills: ADL, ROM, IADL, Endurance, Body mechanics, Sensation, Mobility, Flexibility, Strength, Coordination, FMC, Pain, UE functional use, Decreased knowledge of use of DME       Visit Diagnosis: Muscle weakness (generalized)  Other lack of coordination  Unsteadiness on feet    Problem List Patient Active Problem List   Diagnosis Date Noted  . Dependent on walker for ambulation 04/30/2019  . Right hemiparesis ([facility]) 04/30/2019  . Tachycardia 04/30/2019  . Acute blood loss anemia   . Uncontrolled type 2 diabetes mellitus with hyperglycemia ([facility])   . Dry gangrene ([facility])   . Leukocytosis   . Embolic stroke ([facility]) 16/96/7893  . Status post tracheostomy ([facility])   . Pressure injury of skin 03/02/2019  . On mechanically assisted ventilation ([facility])   . Goals of care, counseling/discussion   . Palliative care encounter   . ARDS (adult respiratory distress syndrome) ([facility])   . Cerebral embolism with cerebral infarction 02/17/2019  . Acute respiratory failure with hypoxia ([facility])   . Pneumothorax, right     Carey Bullocks, OTR/L 06/08/2019, 2:13 PM  [facility] [address] [place], [place], [address] Phone: [phone]   Fax:  [phone]  Name: Cruzito Standre MRN: [id number] Date of Birth: [birth year]

## 2019-06-08 NOTE — Therapy (Signed)
[facility] [address] [place], [place], [address] Phone: [phone]   Fax:  [phone]  Physical Therapy Treatment  Patient Details  Name: Andrew Bruce MRN: [id number] Date of Birth: 1965-11-11 Referring Provider (PT): Reesa Chew, [place]   Encounter Date: 06/08/2019  PT End of Session - 06/08/19 1401    Visit Number  4    Number of Visits  9    Date for PT Re-Evaluation  07/05/19    Authorization Type  Eval: 05/10/19 (medicaid)    Progress Note Due on Visit  8    PT Start Time  1315    PT Stop Time  1400    PT Time Calculation (min)  45 min    Equipment Utilized During Treatment  Gait belt    Activity Tolerance  Patient tolerated treatment well       Past Medical History:  Diagnosis Date  . COVID-19 02/06/2019    Past Surgical History:  Procedure Laterality Date  . DIRECT LARYNGOSCOPY Bilateral 04/06/2019   Procedure: MICRO DIRECT LARYNGOSCOPY WITH PROLARYN INJECTION;  Surgeon: Melida Quitter, MD;  Location: [facility];  Service: ENT;  Laterality: Bilateral;  . IR GASTROSTOMY TUBE MOD SED  03/10/2019  . IR GASTROSTOMY TUBE REMOVAL  04/20/2019    There were no vitals filed for this visit.          Treatment:  SpO2: 97%  Resisted walking: 220' with red theraband: x 1 SpO2: 97% Resisted wlaking: 680' with red band SpO2 97%  Neuro re-ed Narrow BOS: on floor: EO: 1' Narrow BOS: on floor: EC: 1' Narrow BOS: on foam: EO: 1' Narrow BOS: on foam: EC: 1' Partial tandem on foam: EC: 1' R and L Partial tandem on foam, EO, Horiontal head turns: 5x R and L, vertical head turns: 5x R and L-added to HEP4  Resisted walking: black sport cord: 3x lateral, 3x bwd, 3 x fwd                       PT Short Term Goals - 06/01/19 1019      PT SHORT TERM GOAL #1   Title  Patient will be able to ambulate 200 feet with cane or no AD for 200 feet to improve household ambulation    Baseline  2 ww  walker (eval); 220 feet without AD (06/01/19)    Time  3    Period  Weeks    Status  Achieved    Target Date  05/31/19      PT SHORT TERM GOAL #2   Title  Pt will be able to perform 5 sit to stands without use of UE for support to improve functional strength    Baseline  must use bil hands for support (Eval); 5x sit to stand in 23 seconds    Time  3    Period  Weeks    Status  Achieved    Target Date  05/31/19      PT SHORT TERM GOAL #3   Title  Patient will be able to go up and down curb and ramp with cane of no AD to improve comminty navigation    Baseline  Pt able to go up and down ramp and curb: 3x with SBA    Time  3    Period  Weeks    Status  Achieved    Target Date  05/31/19        PT Long  Term Goals - 06/01/19 1029      PT LONG TERM GOAL #1   Title  Pt will be able to ambulate with cane or lesser AD for >1500 feet to improve community ambulation.    Baseline  must use 2 ww walker, 1025' without AD (06/01/19)    Time  8    Period  Weeks    Status  Partially Met    Target Date  07/05/19      PT LONG TERM GOAL #2   Title  Pt will be able to stand/walk for >1 hour to improve ability to go shopping    Baseline  5-10 min (eval); 30 min (06/01/19)    Time  8    Period  Weeks    Status  New    Target Date  07/05/19      PT LONG TERM GOAL #3   Title  Pt will demo 56/56 on BBS to improve overall balance and reduce fall risk    Baseline  52/56    Time  8    Period  Weeks    Status  New    Target Date  07/05/19            Plan - 06/08/19 1400    Clinical Impression Statement  Pt's SPO2 maintained at 97% during the session. Pt challanged with balance with EC and non compliant surface. Pt is demonstrating improving walking endurance overall.    Personal Factors and Comorbidities  Comorbidity 2;Time since onset of injury/illness/exacerbation;Transportation    Comorbidities  Hx of CVA, Gangerene of toes    Examination-Activity Limitations   Bathing;Hygiene/Grooming;Patent attorney for Others    Examination-Participation Restrictions  Community Activity;Driving;Laundry;Yard Work;Shop;Meal Prep    Stability/Clinical Decision Making  Evolving/Moderate complexity    Rehab Potential  Good    PT Frequency  1x / week    PT Duration  8 weeks    PT Treatment/Interventions  ADLs/Self Care Home Management;Gait training;Stair training;Functional mobility training;Therapeutic exercise;Therapeutic activities;Balance training;Patient/family education;Orthotic Fit/Training;Passive range of motion;Manual techniques;Energy conservation;Visual/perceptual remediation/compensation;Joint Manipulations    PT Next Visit Plan  Review partial tandem on floor with head turns- progress it to non compliant surface.    PT Home Exercise Plan  Access Code: [email or web address]: [email or web address]: 04/26/2021Prepared by: Gwenyth Bouillon PatelExercisesStanding Gastroc Stretch on Foam 1/2 Roll - 3 x daily - 7 x weekly - 1 sets - 3 reps - 30 hold    Consulted and Agree with Plan of Care  Patient;Family member/caregiver    Family Member Consulted  Wife       Patient will benefit from skilled therapeutic intervention in order to improve the following deficits and impairments:  Abnormal gait, Decreased balance, Decreased mobility, Decreased endurance, Decreased range of motion, Decreased strength, Difficulty walking, Impaired sensation, Pain  Visit Diagnosis: Right hemiparesis [facility])     Problem List Patient Active Problem List   Diagnosis Date Noted  . Dependent on walker for ambulation 04/30/2019  . Right hemiparesis ([employer]) 04/30/2019  . Tachycardia 04/30/2019  . Acute blood loss anemia   . Uncontrolled type 2 diabetes mellitus with hyperglycemia ([place])   . Dry gangrene ([place])   . Leukocytosis   . Embolic stroke ([place]) 69/45/0388  . Status post tracheostomy ([place])   . Pressure injury of skin 03/02/2019  . On mechanically assisted ventilation ([place])    . Goals of care, counseling/discussion   . Palliative care encounter   . ARDS (adult respiratory distress syndrome) ([place])   . Cerebral embolism with  cerebral infarction 02/17/2019  . Acute respiratory failure with hypoxia ([place])   . Pneumothorax, right     Kerrie Pleasure, PT 06/08/2019, 2:03 PM  [facility] [address] [place], [place], [address] Phone: [phone]   Fax:  [phone]  Name: Joanne Brander MRN: [id number] Date of Birth: Jan 01, 1966

## 2019-06-08 NOTE — Therapy (Signed)
[facility] [address] [place], [place], [address] Phone: [phone]   Fax:  [phone]  Speech Language Pathology Treatment  Patient Details  Name: Andrew Bruce MRN: [id number] Date of Birth: 14-Nov-1965 Referring Provider (SLP): Dionisio David, NP   Encounter Date: 06/08/2019  End of Session - 06/08/19 1242    Visit Number  3    Number of Visits  17    Date for SLP Re-Evaluation  08/10/19    SLP Start Time  1148    SLP Stop Time   1230    SLP Time Calculation (min)  42 min    Activity Tolerance  Patient tolerated treatment well       Past Medical History:  Diagnosis Date  . COVID-19 02/06/2019    Past Surgical History:  Procedure Laterality Date  . DIRECT LARYNGOSCOPY Bilateral 04/06/2019   Procedure: MICRO DIRECT LARYNGOSCOPY WITH PROLARYN INJECTION;  Surgeon: Melida Quitter, MD;  Location: [facility];  Service: ENT;  Laterality: Bilateral;  . IR GASTROSTOMY TUBE MOD SED  03/10/2019  . IR GASTROSTOMY TUBE REMOVAL  04/20/2019    There were no vitals filed for this visit.  Subjective Assessment - 06/08/19 1149    Subjective  "It's a little bit better."    Patient is accompained by:  Interpreter   Elta Guadeloupe, interpreter   Currently in Pain?  No/denies            ADULT SLP TREATMENT - 06/08/19 1150      General Information   Behavior/Cognition  Alert;Cooperative;Pleasant mood      Treatment Provided   Treatment provided  Cognitive-Linquistic      Pain Assessment   Pain Assessment  No/denies pain      Cognitive-Linquistic Treatment   Treatment focused on  Voice;Patient/family/caregiver education    Skilled Treatment  Pt success with abdominal breathing at rest approximately 85%; reports he is practicing 4x daily at home. SLP suggested pt use a mirror so that he can monitor for chest/shoulder rise. Use of AB/increased vocal effort in automatic speech tasks 80% accuracy with min-mod cues progressing to  sentence level tasks. Pt with questions about swallowing; notes "I can't drink fast." SLP educated pt using visual aid re: left vocal cord paralysis, physiology of swallowing and voice. Pt progressed to dysphagia 3, thin liquid textures while on rehab; has EMST/IMST at home which he reports he is still using. He is unsure of how to adjust the resistance; SLP instructed him to bring these to next session.       Assessment / Recommendations / Plan   Plan  Goals updated      Progression Toward Goals   Progression toward goals  Progressing toward goals       SLP Education - 06/08/19 1242    Education Details  left vocal cord paralysis, role of VFs in swallowing and voice production    Person(s) Educated  Patient    Methods  Explanation;Demonstration;Verbal cues    Comprehension  Verbalized understanding;Need further instruction         SLP Long Term Goals - 06/08/19 1246      SLP LONG TERM GOAL #1   Title  pt will report following 3 aspects of vocal conservation program between 4 sessions    Baseline  no aspects    Time  7    Period  Weeks   or visits, for all LTGs   Status  On-going      SLP LONG TERM  GOAL #2   Title  Pt will complete exercises for vocal fold adduction with rare min A x3 visits.    Baseline  not provided yet    Time  7    Period  Weeks    Status  Revised      SLP LONG TERM GOAL #3   Title  pt will use abdominal breathing in 5 minutes simple conversation in 3 sessions    Baseline  chest breathing    Time  7    Period  Weeks    Status  On-going      SLP LONG TERM GOAL #4   Title  pt will use WNL vocal effort without strain/pushing for vocalization with rare min A during 3 ST sessions    Baseline  strainedpushed until SPL told pt not to do so    Time  7    Period  Weeks    Status  On-going      SLP LONG TERM GOAL #5   Title  pt will rate his voice at least 7/10 (where 10=WNL voice)    Baseline  5/10    Time  8    Period  Weeks    Status  On-going       Additional Long Term Goals   Additional Long Term Goals  Yes      SLP LONG TERM GOAL #6   Title  Patient will tell SLP 3 signs of aspiration PNA.    Time  8    Period  Weeks    Status  New       Plan - 06/08/19 1243    Clinical Impression Statement  Pt presents with mod hoarseness (R49.0)  of unclear etiology. 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 Today SLP continued training pt in abdominal breathing. May benefit from exercises for vocal fold adduction; have amended goals and POC to add these interventions; to be assigned next session. SLP believes pt will benefit from at least x1/week ST for exercises for vocal fold adduction, pharyngeal dysphagia, vocal hygiene and education/training on abdominal breathing to maximize breath support for speech.    Speech Therapy Frequency  1x /week    Duration  --   8 weeks   Treatment/Interventions  Functional tasks;Internal/external aids;Patient/family education;Compensatory strategies;SLP instruction and feedback;Other (comment);Aspiration precaution training;Pharyngeal strengthening exercises;Diet toleration management by SLP;Trials of upgraded texture/liquids   vocal hygiene program, vocal function/adduction exercises   Potential to Achieve Goals  Good   tentative potential- without ENT consult difficult to ascertain   Potential Considerations  Financial resources    SLP Home Exercise Plan  provided today    Consulted and Agree with Plan of Care  Patient       Patient will benefit from skilled therapeutic intervention in order to improve the following deficits and impairments:   Other voice and resonance disorders  Dysphagia, oropharyngeal phase  Cerebral infarction, unspecified mechanism (HCC)  Vocal cord paralysis    Problem List Patient Active Problem List   Diagnosis Date Noted  . Dependent on walker for ambulation 04/30/2019  . Right hemiparesis  (HCC) 04/30/2019  . Tachycardia 04/30/2019  . Acute blood loss anemia   . Uncontrolled type 2 diabetes mellitus with hyperglycemia (H[facility])   . Dry gangrene (HCC)   . Leukocytosis   . 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 Status post tracheostomy (HCC)   . Pressure injury of skin 03/02/2019  .  On mechanically assisted ventilation (HCC)   . Goals of care, counseling/discussion   . Palliative care encounter   . ARDS (adult respiratory distress syndrome) (HCC)   . Cerebral embolism with cerebral infarction 02/17/2019  . Acute respiratory failure with hypoxia (HCC)   . Pneumothorax, right    Rondel Baton, MS, CCC-SLP Speech-Language Pathologist   Arlana Lindau 06/08/2019, 12:51 PM  [facility] [address] [place], [place], [address] Phone: [phone]   Fax:  [phone]   Name: Andrew Bruce MRN: [id number] Date of Birth: 08-14-1965

## 2019-06-10 ENCOUNTER — Encounter: Payer: Self-pay | Admitting: Speech Pathology

## 2019-06-10 ENCOUNTER — Ambulatory Visit (INDEPENDENT_AMBULATORY_CARE_PROVIDER_SITE_OTHER): Payer: Medicaid Other | Admitting: Nurse Practitioner

## 2019-06-10 ENCOUNTER — Other Ambulatory Visit: Payer: Self-pay

## 2019-06-10 ENCOUNTER — Encounter: Payer: Self-pay | Admitting: Nurse Practitioner

## 2019-06-10 VITALS — BP 140/86 | HR 94 | Temp 98.5°F | Ht 66.0 in | Wt 164.2 lb

## 2019-06-10 DIAGNOSIS — I96 Gangrene, not elsewhere classified: Secondary | ICD-10-CM

## 2019-06-10 DIAGNOSIS — R262 Difficulty in walking, not elsewhere classified: Secondary | ICD-10-CM

## 2019-06-10 DIAGNOSIS — E119 Type 2 diabetes mellitus without complications: Secondary | ICD-10-CM

## 2019-06-10 DIAGNOSIS — Z8616 Personal history of COVID-19: Secondary | ICD-10-CM

## 2019-06-10 DIAGNOSIS — Z8673 Personal history of transient ischemic attack (TIA), and cerebral infarction without residual deficits: Secondary | ICD-10-CM

## 2019-06-10 DIAGNOSIS — H539 Unspecified visual disturbance: Secondary | ICD-10-CM | POA: Diagnosis not present

## 2019-06-10 DIAGNOSIS — R490 Dysphonia: Secondary | ICD-10-CM

## 2019-06-10 NOTE — Progress Notes (Signed)
[facility] [place], [place]  [address] Phone:  [phone]   Fax:  [phone]  Established Patient Office Visit  Subjective:  Patient ID: Andrew Bruce, male    DOB: October 14, 1965  Age: 54 y.o. MRN: [id number]  CC:  Chief Complaint  Patient presents with  . Follow-up    HPI Andrew Bruce presents for follow up. He  has a past medical history of COVID-19 (02/06/2019).   Diabetes Mellitus Patient presents for follow up of diabetes. Current symptoms include: foot ulcerations and paresthesia of the feet. Symptoms have progressed to a point and plateaued. Patient denies hypoglycemia , increased appetite, nausea, polydipsia, polyuria, vomiting and weight loss. Evaluation to date has included: fasting blood sugar, fasting lipid panel, hemoglobin A1C and microalbuminuria.  Home sugars: BGs have been labile ranging between 141-142  and 140. Current treatment: Continued sulfonylurea which has been effective and Continued metformin which has been effective. Last dilated eye exam: na.  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  He applied for unemployment which was denied. He was informed by his current employer that he needs a return to work note. He has been out of work for 5 months.   He has applied for the [facility] card application however he has not heard any response.   Past Medical History:  Diagnosis Date  . COVID-19 02/06/2019    Past Surgical History:  Procedure Laterality Date  . DIRECT LARYNGOSCOPY Bilateral 04/06/2019   Procedure: MICRO DIRECT LARYNGOSCOPY WITH PROLARYN INJECTION;  Surgeon: Melida Quitter, MD;  Location: [facility];  Service: ENT;  Laterality: Bilateral;  . IR GASTROSTOMY TUBE MOD SED  03/10/2019  . IR GASTROSTOMY TUBE REMOVAL  04/20/2019    Family History  Problem Relation Age of Onset  . Healthy Sister   . Healthy Brother     Social History   Socioeconomic History  . Marital status: Married    Spouse name: Not on file  . Number  of children: Not on file  . Years of education: Not on file  . Highest education level: Not on file  Occupational History  . Not on file  Tobacco Use  . Smoking status: Former Research scientist (life sciences)  . Smokeless tobacco: Never Used  Substance and Sexual Activity  . Alcohol use: Yes  . Drug use: Never  . Sexual activity: Yes  Other Topics Concern  . Not on file  Social History Narrative  . Not on file   Social Determinants of Health   Financial Resource Strain:   . Difficulty of Paying Living Expenses:   Food Insecurity:   . Worried About Charity fundraiser in the Last Year:   . Arboriculturist in the Last Year:   Transportation Needs:   . Film/video editor (Medical):   Marland Kitchen Lack of Transportation (Non-Medical):   Physical Activity:   . Days of Exercise per Week:   . Minutes of Exercise per Session:   Stress:   . Feeling of Stress :   Social Connections:   . Frequency of Communication with Friends and Family:   . Frequency of Social Gatherings with Friends and Family:   . Attends Religious Services:   . Active Member of Clubs or Organizations:   . Attends Archivist Meetings:   Marland Kitchen Marital Status:   Intimate Partner Violence:   . Fear of Current or Ex-Partner:   . Emotionally Abused:   Marland Kitchen Physically Abused:   .  Sexually Abused:     Outpatient Medications Prior to Visit  Medication Sig Dispense Refill  . acetaminophen (TYLENOL) 325 MG tablet Take 1-2 tablets (325-650 mg total) by mouth every 4 (four) hours as needed for mild pain. 100 tablet 0  . Amino Acids-Protein Hydrolys (FEEDING SUPPLEMENT, PRO-STAT SUGAR FREE 64,) LIQD Take 30 mLs by mouth 3 (three) times daily. 887 mL 0  . apixaban (ELIQUIS) 5 MG TABS tablet Take 1 tablet (5 mg total) by mouth 2 (two) times daily. 60 tablet 1  . ascorbic acid (VITAMIN C) 500 MG tablet Take 1 tablet (500 mg total) by mouth daily. 30 tablet 0  . atorvastatin (LIPITOR) 20 MG tablet Take 1 tablet (20 mg total) by mouth daily at 6 PM. 30  tablet 0  . blood glucose meter kit and supplies KIT Dispense based on patient and insurance preference. Use up to four times daily as directed. (FOR ICD-9 250.00, 250.01). 1 each 0  . collagenase (SANTYL) ointment Apply topically daily. To sacrum and cover with damp to dry dressing 30 g 1  . docusate sodium (COLACE) 100 MG capsule Take 2 capsules (200 mg total) by mouth at bedtime as needed for moderate constipation. Do not use longer than 7 days. 60 capsule 0  . doxazosin (CARDURA) 1 MG tablet Take 1 tablet (1 mg total) by mouth at bedtime. 30 tablet 0  . famotidine (PEPCID) 20 MG tablet Take 1 tablet (20 mg total) by mouth 2 (two) times daily. 60 tablet 1  . glipiZIDE (GLUCOTROL) 5 MG tablet Take 0.5 tablets (2.5 mg total) by mouth daily before breakfast. 30 tablet 0  . iron polysaccharides (NIFEREX) 150 MG capsule Take 1 capsule (150 mg total) by mouth 2 (two) times daily. 60 capsule 0  . metFORMIN (GLUCOPHAGE) 500 MG tablet Take 1 tablet (500 mg total) by mouth 2 (two) times daily with a meal. 60 tablet 0  . metoprolol tartrate (LOPRESSOR) 25 MG tablet Take 0.5 tablets (12.5 mg total) by mouth 2 (two) times daily. 30 tablet 0  . potassium chloride (KLOR-CON) 10 MEQ tablet Take 1 tablet (10 mEq total) by mouth daily. 30 tablet 0  . senna-docusate (SENOKOT-S) 8.6-50 MG tablet Place 1 tablet into feeding tube at bedtime as needed for mild constipation. 30 tablet 0  . traMADol (ULTRAM) 50 MG tablet Take 1 tablet (50 mg total) by mouth every 6 (six) hours as needed for moderate pain or severe pain. 120 tablet 0  . traZODone (DESYREL) 50 MG tablet Take 0.5 tablets (25 mg total) by mouth at bedtime as needed for sleep. 15 tablet 0  . zinc sulfate 220 (50 Zn) MG capsule Take 1 capsule (220 mg total) by mouth daily. 30 capsule 0  . guaiFENesin (ROBITUSSIN) 100 MG/5ML SOLN Take 10 mLs (200 mg total) by mouth every 6 (six) hours as needed for cough or to loosen phlegm. (Patient not taking: Reported on  06/10/2019) 236 mL 0  . hydroxypropyl methylcellulose / hypromellose (ISOPTO TEARS / GONIOVISC) 2.5 % ophthalmic solution Place 1 drop into both eyes 4 (four) times daily as needed for dry eyes. (Patient not taking: Reported on 06/10/2019) 15 mL 12   No facility-administered medications prior to visit.    No Known Allergies  ROS Review of Systems  All other systems reviewed and are negative.     Objective:    Physical Exam  Constitutional: He appears well-developed.  HENT:  Head: Normocephalic and atraumatic.  Cardiovascular: Normal rate, regular rhythm  and normal heart sounds.  Pulmonary/Chest: Effort normal and breath sounds normal.  Abdominal: Soft. Bowel sounds are normal.  Musculoskeletal:     Cervical back: Normal range of motion.     Right foot: Decreased range of motion.     Left foot: Decreased range of motion.       Feet:  Skin: Skin is warm and dry.    BP 140/86   Pulse 94   Temp 98.5 F (36.9 C)   Ht _0  (1.676 m)   Wt 164 lb 3.2 oz (74.5 kg)   SpO2 99%   BMI 26.50 kg/m  Wt Readings from Last 3 Encounters:  06/10/19 164 lb 3.2 oz (74.5 kg)  05/18/19 160 lb (72.6 kg)  05/13/19 160 lb 3.2 oz (72.7 kg)     Health Maintenance Due  Topic Date Due  . FOOT EXAM  Never done  . OPHTHALMOLOGY EXAM  Never done  . COVID-19 Vaccine (1) Never done  . HIV Screening  Never done  . TETANUS/TDAP  Never done  . COLONOSCOPY  Never done    There are no preventive care reminders to display for this patient.  Lab Results  Component Value Date   TSH 4.156 03/21/2019   Lab Results  Component Value Date   WBC 8.9 05/13/2019   HGB 14.1 05/13/2019   HCT 44.4 05/13/2019   MCV 83 05/13/2019   PLT 386 05/13/2019   Lab Results  Component Value Date   NA 141 05/13/2019   K 4.2 05/13/2019   CO2 25 04/19/2019   GLUCOSE 131 (H) 05/13/2019   BUN 10 05/13/2019   CREATININE 0.60 (L) 05/13/2019   BILITOT 0.3 05/13/2019   ALKPHOS 87 05/13/2019   AST 18 05/13/2019     ALT 76 (H) 03/24/2019   PROT 7.4 05/13/2019   ALBUMIN 4.4 05/13/2019   CALCIUM 10.1 05/13/2019   ANIONGAP 11 04/19/2019   Lab Results  Component Value Date   CHOL 134 02/17/2019   Lab Results  Component Value Date   HDL 33 (L) 02/17/2019   Lab Results  Component Value Date   LDLCALC 75 02/17/2019   Lab Results  Component Value Date   TRIG 551 (H) 03/01/2019   Lab Results  Component Value Date   CHOLHDL 4.1 02/17/2019   Lab Results  Component Value Date   HGBA1C 5.8 (A) 05/13/2019      Assessment & Plan:   Problem List Items Addressed This Visit      Other   Dry gangrene ([place]) - Primary   Impaired ambulation    Other Visit Diagnoses    Visual changes       History of stroke       Continue with therapy as scheduled Continue with anticoagulant   History of COVID-19       DM type 2, goal HbA1c < 7% (HCC)       Continue with current regimen   Hoarseness of voice          No orders of the defined types were placed in this encounter.   Follow-up: Return in about 2 months (around 08/10/2019).    Vevelyn Francois, NP

## 2019-06-10 NOTE — Patient Instructions (Addendum)
Andrew Bruce needs to get FMLA form from his employer    [name] complicaciones de la diabetes mellitus Preventing Diabetes Mellitus Complications Usted puede actuar para prevenir o disminuir los problemas causados por la diabetes (diabetes mellitus). Seguir un plan para la diabetes y cuidarse usted mismo puede reducir el riesgo de complicaciones graves o potencialmente mortales. Qu puedo hacer para prevenir las complicaciones de la diabetes? Controle su diabetes   Siga las indicaciones del mdico acerca de cmo tratar la diabetes. Su diabetes puede ser tratada por un equipo de profesionales de la salud que le pueden ensear a cuidarse y pueden responder las preguntas que tenga.  Instryase sobre su afeccin para tomar decisiones saludables en relacin a los alimentos y la actividad fsica.  Controle su nivel de azcar en la sangre (glucosa) con la frecuencia que le hayan indicado. El mdico lo ayudar a decidir con qu frecuencia debe revisar su nivel de glucemia, en funcin de los objetivos de su tratamiento y el xito en cumplirlos.  Consltele a su mdico si debe tomar aspirina en dosis bajas a diario y cul es la dosis recomendada para usted. Tomar aspirina en dosis bajas a diario se recomienda para ayudar a prevenir la enfermedad cardiovascular. No consuma nicotina ni tabaco No consuma ningn producto que contenga nicotina o tabaco, como cigarrillos y cigarrillos electrnicos. Si necesita ayuda para dejar de fumar, consulte al mdico. La nicotina aumenta el riesgo de problemas con la diabetes. Si deja de consumir nicotina:  Se reduce el riesgo de infarto de miocardio, accidente cerebrovascular, enfermedades del sistema nervioso y enfermedad renal.  Pueden mejorar el colesterol y sus niveles de presin arterial.  La circulacin de la sangre mejorar. Mantenga su presin arterial bajo control Su objetivo de presin arterial personal se determina sobre la base de:  Su  edad.  Los medicamentos que toma.  El tiempo transcurrido desde que tiene diabetes.  Otras afecciones mdicas que tenga. Para controlar su presin arterial:  Siga las indicaciones del mdico sobre la planificacin de las comidas, el ejercicio y los medicamentos.  Asegrese de que el [employer] mida la presin arterial en cada visita mdica.  Contrlese la presin arterial en su casa segn las indicaciones del mdico.  Mantenga los niveles de colesterol bajo control Para controlar su colesterol:  Siga las indicaciones del mdico sobre la planificacin de las comidas, el ejercicio y los medicamentos.  Controle su nivel de colesterol por lo menos una vez al ao.  Es posible que le receten medicamentos para bajar sus niveles de colesterol (estatinas). Si no est tomando estatinas, pregntele a su mdico si debera tomar una. Controlar su colesterol, puede:  Ayudarlo a prevenir enfermedades cardacas y un accidente cerebrovascular. Estos son los problemas de salud ms comunes para las personas con diabetes.  Mejorar el flujo de [place]. Planifique y cumpla con sus exmenes fsicos y oculares anuales Su mdico le informar con qu frecuencia deber asistir a visitas mdicas, dependiendo de su plan de control de la diabetes. Concurra a todas las visitas de seguimiento como se le haya indicado. Esto es importante para identificar rpidamente posibles problemas y poder evitar o tratar las complicaciones.  Cada visita a su mdico deber incluir la medicin de: ? Su peso. ? Presin arterial. ? Nivel de glucemia.  Su nivel de A1c (hemoglobina A1c) debe controlarse: ? Al menos 2 veces al ao, si cumple los objetivos del tratamiento. ? [employer], si no cumple los objetivos del [place] o  si sus objetivos han cambiado.  Los lpidos de la sangre (perfil lipdico) deben controlarse anualmente. Tambin se debe controlar anualmente la presencia de protenas en la orina  (microalbuminuria).  Si tiene diabetes tipo1, hgase un examen ocular en el trmino de 3 a 5aos despus del diagnstico y, luego, [place]omia Dear vez al ao despus del Risk manager.  Si tiene diabetes tipo2, hgase un examen ocular tan pronto como le diagnostiquen la enfermedad y, [place], una vez por ao despus del Risk manager. Mantngase al da con las vacunas Se recomienda que reciba:  [place] antigripal (gripe) todos los aos.  Vacuna contra la neumona (vacuna antineumoccica) y contra la hepatitisB. Si es mayor de 65aos, puede recibir la vacuna antineumoccica como una serie de dos inyecciones [place]. Pregntele al mdico qu otras vacunas puede recomendar para usted. Cuide sus pies La diabetes puede hacer que la circulacin sangunea en las piernas y los pies sea deficiente. Por eso, el cuidado de los pies es muy importante. La diabetes puede provocar:  Que la piel de los pies se vuelva ms fina, se rompa ms fcilmente y cicatrice ms lentamente.  Dao nervioso en las piernas y los pies, lo que puede Forensic scientist en una disminucin de la sensibilidad. Es posible que no advierta las heridas ms pequeas que pueden conducir a Systems developer graves. Para evitar problemas en los pies:  Examine a diario su piel y pies en busca de cortes, moretones, enrojecimiento, ampollas o llagas.  Programe una cita para que el [employer] controle los pies una vez por ao. Este examen incluye: ? Inspeccionar la estructura y la piel de sus pies. ? Revisar los pulsos y sensaciones de sus pies.  Asegrese de que su mdico realice un examen visual de los pies en cada visita mdica.  Cuide sus [employer] con diabetes mal controlada son ms propensas a Immunologist (periodontales). La diabetes puede hacer que las enfermedades periodontales sean ms difciles de Chief Operating Officer. Las enfermedades periodontales, si no se tratan, pueden conducir a la prdida de dientes. Para  prevenirlas:  Cepllese los [employer].  Use hilo dental al menos una vez al da.  Visite al [employer] por ao. Beba de manera responsable Limite el consumo de alcohol a no ms de por da si es mujer y no est [place], y a por da si es hombre. Una medida equivale a 12oz ( ) de cerveza, 5oz ( ) de vino o 1oz (8ml) de bebidas alcohlicas de alta graduacin.  Es importante que consuma alimentos cuando bebe alcohol para evitar la glucemia baja (hipoglucemia). Evite consumir alcohol si:  Tiene antecedentes de consumo excesivo o dependencia de alcohol.  Est embarazada.  Tiene enfermedad heptica, pancreatitis, neuropata avanzada, o hipertrigliceridemia grave. Disminuya el nivel de estrs Vivir con diabetes puede ser estresante. Cuando usted est bajo estrs, la glucemia puede verse afectada de dos maneras:  Las hormonas del estrs pueden hacer que el nivel de glucemia suba.  Probablemente deje de cuidarse como debera. Est al tanto del nivel de estrs y haga los cambios que sean necesarios para ayudar a Company secretary las situaciones difciles. Para disminuir sus niveles de estrs:  Considere la posibilidad de Advertising account planner en un grupo de apoyo.  Realice relajacin o meditacin planificada.  Adopte un pasatiempo que disfrute.  Mantenga relaciones saludables.  Hacer ejercicio regularmente.  Trabaje con su mdico o un profesional de la salud mental. Resumen  Usted puede actuar para prevenir o disminuir los problemas causados  por la diabetes (diabetes mellitus). Seguir un plan para la diabetes y cuidarse usted mismo puede reducir el riesgo de complicaciones graves o potencialmente mortales.  Siga las indicaciones del mdico acerca de cmo tratar la diabetes. Su diabetes puede ser tratada por un equipo de profesionales de la salud que le pueden ensear a cuidarse y pueden responder las preguntas que tenga.  Su mdico le informar  con qu frecuencia deber asistir a visitas mdicas, dependiendo de su plan de control de la diabetes. Concurra a todas las visitas de seguimiento como se le haya indicado. Esto es importante para identificar rpidamente posibles problemas y poder evitar o tratar las complicaciones. Esta informacin no tiene Theme park manager el consejo del mdico. Asegrese de hacerle al mdico cualquier pregunta que tenga. Document Revised: 10/31/2016 Document Reviewed: 02/24/2013 Elsevier Patient Education  2020 [employer].

## 2019-06-11 ENCOUNTER — Ambulatory Visit: Payer: Self-pay

## 2019-06-11 DIAGNOSIS — R262 Difficulty in walking, not elsewhere classified: Secondary | ICD-10-CM | POA: Insufficient documentation

## 2019-06-15 ENCOUNTER — Ambulatory Visit: Payer: Medicaid Other | Attending: Physical Medicine and Rehabilitation

## 2019-06-15 ENCOUNTER — Ambulatory Visit: Payer: Medicaid Other

## 2019-06-15 ENCOUNTER — Other Ambulatory Visit: Payer: Self-pay

## 2019-06-15 ENCOUNTER — Ambulatory Visit: Payer: Medicaid Other | Admitting: Occupational Therapy

## 2019-06-15 DIAGNOSIS — R1312 Dysphagia, oropharyngeal phase: Secondary | ICD-10-CM | POA: Diagnosis present

## 2019-06-15 DIAGNOSIS — R278 Other lack of coordination: Secondary | ICD-10-CM | POA: Insufficient documentation

## 2019-06-15 DIAGNOSIS — R209 Unspecified disturbances of skin sensation: Secondary | ICD-10-CM | POA: Diagnosis present

## 2019-06-15 DIAGNOSIS — R490 Dysphonia: Secondary | ICD-10-CM | POA: Diagnosis present

## 2019-06-15 DIAGNOSIS — M6281 Muscle weakness (generalized): Secondary | ICD-10-CM

## 2019-06-15 DIAGNOSIS — R2681 Unsteadiness on feet: Secondary | ICD-10-CM | POA: Insufficient documentation

## 2019-06-15 DIAGNOSIS — I639 Cerebral infarction, unspecified: Secondary | ICD-10-CM | POA: Diagnosis present

## 2019-06-15 DIAGNOSIS — R2689 Other abnormalities of gait and mobility: Secondary | ICD-10-CM | POA: Insufficient documentation

## 2019-06-15 DIAGNOSIS — J3800 Paralysis of vocal cords and larynx, unspecified: Secondary | ICD-10-CM | POA: Diagnosis present

## 2019-06-15 DIAGNOSIS — G8191 Hemiplegia, unspecified affecting right dominant side: Secondary | ICD-10-CM | POA: Insufficient documentation

## 2019-06-15 DIAGNOSIS — R498 Other voice and resonance disorders: Secondary | ICD-10-CM | POA: Diagnosis present

## 2019-06-15 NOTE — Therapy (Signed)
[facility] [address] [place], [place], [address] Phone: [phone]   Fax:  [phone]  Occupational Therapy Treatment  Patient Details  Name: Andrew Bruce MRN: [id number] Date of Birth: 1965/05/19 Referring Provider (OT): Reesa Chew PA-C   Encounter Date: 06/15/2019  OT End of Session - 06/15/19 6144    Visit Number  5    Number of Visits  17    Date for OT Re-Evaluation  07/10/19    Authorization Type  ? MCD pending - pt did NOT have MCD at time of evaluation    OT Start Time  1315    OT Stop Time  1400    OT Time Calculation (min)  45 min    Activity Tolerance  Patient tolerated treatment well    Behavior During Therapy  WFL for tasks assessed/performed       Past Medical History:  Diagnosis Date  . COVID-19 02/06/2019    Past Surgical History:  Procedure Laterality Date  . DIRECT LARYNGOSCOPY Bilateral 04/06/2019   Procedure: MICRO DIRECT LARYNGOSCOPY WITH PROLARYN INJECTION;  Surgeon: Melida Quitter, MD;  Location: [place] [facility] [place];  Service: ENT;  Laterality: Bilateral;  . IR GASTROSTOMY TUBE MOD SED  03/10/2019  . IR GASTROSTOMY TUBE REMOVAL  04/20/2019    There were no vitals filed for this visit.  Subjective Assessment - 06/15/19 1312    Patient is accompanied by:  Interpreter    Pertinent History  CVA w/ mild Rt hemiparesis secondary to COVID and ARF Jan 2021. Pt intubated 02/07/19 and trachecd 03/29/38 - ICU complicated by PE and multiple CVA'S, gangrene of toes Lt 1-4 and Rt 1, 2, 3, 5.    Limitations  no driving, no heavy lifting    Patient Stated Goals  go back to work    Currently in Pain?  Yes   Toes - O.T. not addressing                  OT Treatments/Exercises (OP) - 06/15/19 0001      Exercises   Exercises  Hand      Shoulder Exercises: ROM/Strengthening   UBE (Upper Arm Bike)  UBE x 8 min, level 1 for UE conditioning/endurance with O2 sats 96%     Fine Motor Coordination  (Hand/Wrist)   Fine Motor Coordination  Small Pegboard;Handwriting;Manipulation of small objects    Small Pegboard  Pt using Rt hand to pick up small pegs to place in pegboard for coordination Rt hand w/ mod to max drops and compensations using middle finger d/t numbness index finger, copying design with min cues for correct color, spacing, and diagonal pattern. Pt then removing pegs up to 5 for in hand manipulation (fingertip to/from palm translation)     Manipulation of small objects  Pt manipulating 1" block and dii between first 3 fingers in numerical order to force use of index finger w/ mod difficulty    Handwriting  Practiced writing name and address in print w/ cues to stay on line and separate "e" from "n". Pt had the most difficulty w/ letters: n, r, g (pt writes in all caps)               OT Short Term Goals - 06/08/19 1408      OT SHORT TERM GOAL #1   Title  Independent with HEP for RT hand strength and coordination    Baseline  dependent - not yet issued    Time  4  Period  Weeks    Status  Achieved      OT SHORT TERM GOAL #2   Title  Pt to be supervision only for bathing while seated    Baseline  dependent    Time  4    Period  Weeks    Status  Achieved   in standing w/ grab bar     OT SHORT TERM GOAL #3   Title  Pt to perform 10 min of activity with only 1 rest break and no significant change in HR    Baseline  Pt gets SOB and requires frequent rest breaks    Time  4    Period  Weeks    Status  On-going   Pt standing for 10 minutes with O2 sats 94%     OT SHORT TERM GOAL #4   Title  Pt to improve grip strength Rt hand to 30 lbs, and Lt hand to 45 lbs    Baseline  Rt = 20, Lt = 38 lbs    Time  4    Period  Weeks    Status  On-going      OT SHORT TERM GOAL #5   Title  Improve coordination as evidenced by performing 9 hole peg test in 50 sec or less Rt dominant hand    Baseline  Rt = 63.94 sec    Time  4    Period  Weeks    Status  New        OT  Long Term Goals - 05/10/19 1406      OT LONG TERM GOAL #1   Title  Independent with BUE HEP for strength and endurance    Baseline  dependent - not yet issued    Time  8    Period  Weeks    Status  New      OT LONG TERM GOAL #2   Title  Pt to return to simple cooking tasks for 15 min or greater at mod I level    Baseline  dependent    Time  8    Period  Weeks    Status  New      OT LONG TERM GOAL #3   Title  Pt to return to light household work (taking out trash, washing dishes, etc) mod I level without rest breaks    Baseline  dependent    Time  8    Period  Weeks    Status  New      OT LONG TERM GOAL #4   Title  Pt to improve grip strength RT dominant hand to 40 lbs or greater    Baseline  Rt = 20 lbs    Time  8    Period  Weeks    Status  New      OT LONG TERM GOAL #5   Title  Pt to improve coordination as evidenced by performing 9 hole peg test in 40 sec or less Rt hand    Baseline  63.94 sec    Time  8    Period  Weeks    Status  New            Plan - 06/15/19 1359    Clinical Impression Statement  Pt progressing with Rt hand coordination and handwriting. Pt has numbness index finger and thumb which makes manipulating small items difficult    Occupational performance deficits (Please refer to evaluation for details):  ADL's;IADL's;Work;Leisure  Body Structure / Function / Physical Skills  ADL;ROM;IADL;Endurance;Body mechanics;Sensation;Mobility;Flexibility;Strength;Coordination;FMC;Pain;UE functional use;Decreased knowledge of use of DME    Rehab Potential  Good    OT Frequency  2x / week    OT Duration  8 weeks    OT Treatment/Interventions  Self-care/ADL training;Therapeutic exercise;Functional Mobility Training;Aquatic Therapy;Neuromuscular education;Manual Therapy;Energy conservation;Therapeutic activities;Coping strategies training;DME and/or AE instruction;Cognitive remediation/compensation;Visual/perceptual remediation/compensation;Moist Heat;Passive  range of motion;Patient/family education    Plan  continue coordination and strength RUE, endurance/conditioning, monitor O2 sats    Consulted and Agree with Plan of Care  Patient       Patient will benefit from skilled therapeutic intervention in order to improve the following deficits and impairments:   Body Structure / Function / Physical Skills: ADL, ROM, IADL, Endurance, Body mechanics, Sensation, Mobility, Flexibility, Strength, Coordination, FMC, Pain, UE functional use, Decreased knowledge of use of DME       Visit Diagnosis: Other lack of coordination  Muscle weakness (generalized)    Problem List Patient Active Problem List   Diagnosis Date Noted  . Impaired ambulation 06/11/2019  . Dependent on walker for ambulation 04/30/2019  . Right hemiparesis (HCC) 04/30/2019  . Tachycardia 04/30/2019  . Acute blood loss anemia   . Uncontrolled type 2 diabetes mellitus with hyperglycemia (HCC)   . Dry gangrene (HCC)   . Leukocytosis   . 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 Status post tracheostomy (HCC)   . Pressure injury of skin 03/02/2019  . On mechanically assisted ventilation (HCC)   . Goals of care, counseling/discussion   . Palliative care encounter   . ARDS (adult respiratory distress syndrome) (HCC)   . Cerebral embolism with cerebral infarction 02/17/2019  . Acute respiratory failure with hypoxia (HCC)   . Pneumothorax, right     Kelli Churn, OTR/L 06/15/2019, 2:04 PM  [facility] [address] [place], [place], [address] Phone: [phone]   Fax:  [phone]  Name: Andrew Bruce MRN: [id number] Date of Birth: 1965-07-09

## 2019-06-15 NOTE — Therapy (Signed)
[facility] [address] [place], [place], [address] Phone: [phone]   Fax:  [phone]  Speech Language Pathology Treatment  Patient Details  Name: Andrew Bruce MRN: [id number] Date of Birth: 11/15/1965 Referring Provider (SLP): Thad Ranger, NP   Encounter Date: 06/15/2019  End of Session - 06/15/19 1709    Visit Number  4    Number of Visits  17    Date for SLP Re-Evaluation  08/10/19    SLP Start Time  1403    SLP Stop Time   1445    SLP Time Calculation (min)  42 min    Activity Tolerance  Patient tolerated treatment well       Past Medical History:  Diagnosis Date  . COVID-19 02/06/2019    Past Surgical History:  Procedure Laterality Date  . DIRECT LARYNGOSCOPY Bilateral 04/06/2019   Procedure: MICRO DIRECT LARYNGOSCOPY WITH PROLARYN INJECTION;  Surgeon: Christia Reading, MD;  [facility];  Service: ENT;  Laterality: Bilateral;  . IR GASTROSTOMY TUBE MOD SED  03/10/2019  . IR GASTROSTOMY TUBE REMOVAL  04/20/2019    There were no vitals filed for this visit.         ADULT SLP TREATMENT - 06/15/19 1429      General Information   Behavior/Cognition  Alert;Cooperative;Pleasant mood      Treatment Provided   Treatment provided  Cognitive-Linquistic      Cognitive-Linquistic Treatment   Treatment focused on  Voice;Patient/family/caregiver education    Skilled Treatment  Pt did not bring EMST or IMST. SLP verified pt also had IMST as well as EMST. SLP educated pt on vocal adduction exercises today - pt req'd mod-max A usually faded to rare min A. SLP expained to pt compensatory strategies for speech loudness and necessity to speak with soft and gentle tone in order to facilitate good vocal hygiene as the voice continues to heal.       Assessment / Recommendations / Plan   Plan  Other (Comment);Continue with current plan of care   goal added for HEP voice adduction     Progression Toward Goals    Progression toward goals  Progressing toward goals       SLP Education - 06/15/19 1709    Education Details  HEP procedure    Person(s) Educated  Patient    Methods  Explanation;Demonstration;Verbal cues;Handout    Comprehension  Returned demonstration;Verbal cues required;Verbalized understanding;Need further instruction         SLP Long Term Goals - 06/15/19 1712      SLP LONG TERM GOAL #1   Title  pt will report following 3 aspects of vocal conservation program between 4 sessions    Baseline  no aspects    Time  6    Period  Weeks   or visits, for all LTGs   Status  On-going      SLP LONG TERM GOAL #2   Title  Pt will complete exercises for vocal fold adduction with rare min A x3 visits.    Baseline  not provided yet    Time  6    Period  Weeks    Status  Revised      SLP LONG TERM GOAL #3   Title  pt will use abdominal breathing in 5 minutes simple conversation in 3 sessions    Baseline  chest breathing    Time  6    Period  Weeks    Status  On-going      SLP LONG TERM GOAL #4   Title  pt will use WNL vocal effort without strain/pushing for vocalization with rare min A during 3 ST sessions    Baseline  strainedpushed until SPL told pt not to do so    Time  6    Period  Weeks    Status  On-going      SLP LONG TERM GOAL #5   Title  pt will rate his voice at least 7/10 (where 10=WNL voice)    Baseline  5/10    Time  7    Period  Weeks    Status  On-going      Additional Long Term Goals   Additional Long Term Goals  Yes      SLP LONG TERM GOAL #6   Title  Patient will tell SLP 3 signs of aspiration PNA.    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 Today SLP trained pt with vocal fold adduction exercises.  SLP believes pt will benefit from at least x1/week ST for exercises for vocal fold adduction, pharyngeal dysphagia, vocal hygiene and education/training on abdominal breathing to maximize breath support for speech.    Speech Therapy Frequency  1x /week    Duration  --   8 weeks   Treatment/Interventions  Functional tasks;Internal/external aids;Patient/family education;Compensatory strategies;SLP instruction and feedback;Other (comment);Aspiration precaution training;Pharyngeal strengthening exercises;Diet toleration management by SLP;Trials of upgraded texture/liquids   vocal hygiene program, vocal function/adduction exercises   Potential to Achieve Goals  Good   tentative potential- without ENT consult difficult to ascertain   Potential Considerations  Financial resources    SLP Home Exercise Plan  provided today    Consulted and Agree with Plan of Care  Patient       Patient will benefit from skilled therapeutic intervention in order to improve the following deficits and impairments:   Dysphagia, oropharyngeal phase  Hoarseness of voice    Problem List Patient Active Problem List   Diagnosis Date Noted  . Impaired ambulation 06/11/2019  . Dependent on walker for ambulation 04/30/2019  . Right hemiparesis ([place]) 04/30/2019  . Tachycardia 04/30/2019  . Acute blood loss anemia   . Uncontrolled type 2 diabetes mellitus with hyperglycemia ([place])   . Dry gangrene ([place])   . Leukocytosis   . Embolic stroke ([place]) 25/95/6387  . Status post tracheostomy ([place])   . Pressure injury of skin 03/02/2019  . On mechanically assisted ventilation ([place])   . Goals of care, counseling/discussion   . Palliative care encounter   . ARDS (adult respiratory distress syndrome) ([place])   . Cerebral embolism with cerebral infarction 02/17/2019  . Acute respiratory  failure with hypoxia ([place])   . Pneumothorax, right     [facility] ,MS, CCC-SLP  06/15/2019, 5:15 PM  [place] [address] [place], [place], [address] Phone: [phone]   Fax:  [phone]   Name: Andrew Bruce MRN: [id number] Date of Birth: 1965/09/17

## 2019-06-15 NOTE — Patient Instructions (Addendum)
Ejercicios de voz para fortalecer su voz: hgalo 2-3 veces al da  1) Respira y aguanta              Puja tus intestinos              Engineer, manufacturing y toser              - repetir 10-15 veces  2) Empuje hacia abajo los brazos de la silla del asiento de una silla              Di "Hey!" fuerte              -repetir 15 veces  3) Tire hacia arriba de los brazos de la silla o del asiento de una silla              Di "Hey!" fuerte              - repetir 15 veces  4) Di "HUH!" en voz alta y Games developer la respiracin durante 3 segundos              -repetir 15 veces

## 2019-06-15 NOTE — Therapy (Signed)
[facility] [address] [place], [place], [address] Phone: [phone]   Fax:  [phone]  Physical Therapy Treatment  Patient Details  Name: Andrew Bruce MRN: [id number] Date of Birth: May 02, 1965 Referring Provider (PT): Reesa Chew, [place]   Encounter Date: 06/15/2019  PT End of Session - 06/15/19 1529    Visit Number  5    Number of Visits  9    Date for PT Re-Evaluation  07/05/19    Authorization Type  Eval: 05/10/19 (medicaid)    Progress Note Due on Visit  8    PT Start Time  9563    PT Stop Time  1530    PT Time Calculation (min)  45 min    Equipment Utilized During Treatment  Gait belt    Activity Tolerance  Patient tolerated treatment well    Behavior During Therapy  [facility] for tasks assessed/performed       Past Medical History:  Diagnosis Date   COVID-19 02/06/2019    Past Surgical History:  Procedure Laterality Date   DIRECT LARYNGOSCOPY Bilateral 04/06/2019   Procedure: MICRO DIRECT LARYNGOSCOPY WITH PROLARYN INJECTION;  Surgeon: Melida Quitter, MD;  Location: [facility];  Service: ENT;  Laterality: Bilateral;   IR GASTROSTOMY TUBE MOD SED  03/10/2019   IR GASTROSTOMY TUBE REMOVAL  04/20/2019    There were no vitals filed for this visit.  Subjective Assessment - 06/15/19 1528    Subjective  Pt reports he walks a lot at home. He is going up and down 15 steps about 5x/day    Patient is accompained by:  Interpreter    Pertinent History  COVID in patient stay, hx of CVA, hx of PE, gangerene of toes    Limitations  Standing;Walking;House hold activities    How long can you stand comfortably?  5-10 min    How long can you walk comfortably?  inside home only    Patient Stated Goals  get stronger                 Treatment Fwd step up: 8" box: 10x R and L no HHA Fwd step up: 8" box: 10x R and L no HHA, 12lb KB in hand Narrow BOS on foam: EC: 2 x 1' Tandem balance on foam: EC: 2 x 1' Side  steps and monster walks (fwd/bwd): red band around ankles: 3 x 10 R and L SLS : on floor: EO, 3 x 30" R and L  Access Code: [email or web address] URL: [email or web address] Date: 06/15/2019 Prepared by: Markus Jarvis  Exercises Side Stepping with Resistance at Ankles - 1 x daily - 7 x weekly - 3 sets - 10 reps Forward Monster Walks - 1 x daily - 7 x weekly - 3 sets - 10 reps Backward Monster Walks - 1 x daily - 7 x weekly - 3 sets - 10 reps                  PT Short Term Goals - 06/01/19 1019      PT SHORT TERM GOAL #1   Title  Patient will be able to ambulate 200 feet with cane or no AD for 200 feet to improve household ambulation    Baseline  2 ww walker (eval); 220 feet without AD (06/01/19)    Time  3    Period  Weeks    Status  Achieved    Target Date  05/31/19      PT SHORT TERM  GOAL #2   Title  Pt will be able to perform 5 sit to stands without use of UE for support to improve functional strength    Baseline  must use bil hands for support (Eval); 5x sit to stand in 23 seconds    Time  3    Period  Weeks    Status  Achieved    Target Date  05/31/19      PT SHORT TERM GOAL #3   Title  Patient will be able to go up and down curb and ramp with cane of no AD to improve comminty navigation    Baseline  Pt able to go up and down ramp and curb: 3x with SBA    Time  3    Period  Weeks    Status  Achieved    Target Date  05/31/19        PT Long Term Goals - 06/01/19 1029      PT LONG TERM GOAL #1   Title  Pt will be able to ambulate with cane or lesser AD for >1500 feet to improve community ambulation.    Baseline  must use 2 ww walker, 1025' without AD (06/01/19)    Time  8    Period  Weeks    Status  Partially Met    Target Date  07/05/19      PT LONG TERM GOAL #2   Title  Pt will be able to stand/walk for >1 hour to improve ability to go shopping    Baseline  5-10 min (eval); 30 min (06/01/19)    Time  8    Period  Weeks    Status  New     Target Date  07/05/19      PT LONG TERM GOAL #3   Title  Pt will demo 56/56 on BBS to improve overall balance and reduce fall risk    Baseline  52/56    Time  8    Period  Weeks    Status  New    Target Date  07/05/19            Plan - 06/15/19 1529    Clinical Impression Statement  Pt demonstrates fatigue due to decreased endurance. Overall, tolerated session well.    Personal Factors and Comorbidities  Comorbidity 2;Time since onset of injury/illness/exacerbation;Transportation    Comorbidities  Hx of CVA, Gangerene of toes    Examination-Activity Limitations  Bathing;Hygiene/Grooming;Patent attorney for Others    Examination-Participation Restrictions  Community Activity;Driving;Laundry;Yard Work;Shop;Meal Prep    Stability/Clinical Decision Making  Evolving/Moderate complexity    Rehab Potential  Good    PT Frequency  1x / week    PT Duration  8 weeks    PT Treatment/Interventions  ADLs/Self Care Home Management;Gait training;Stair training;Functional mobility training;Therapeutic exercise;Therapeutic activities;Balance training;Patient/family education;Orthotic Fit/Training;Passive range of motion;Manual techniques;Energy conservation;Visual/perceptual remediation/compensation;Joint Manipulations    PT Next Visit Plan  Review partial tandem on floor with head turns- progress it to non compliant surface.    PT Home Exercise Plan  Access Code: [email or web address]: [email or web address]: 04/26/2021Prepared by: Gwenyth Bouillon PatelExercisesStanding Gastroc Stretch on Foam 1/2 Roll - 3 x daily - 7 x weekly - 1 sets - 3 reps - 30 hold    Consulted and Agree with Plan of Care  Patient;Family member/caregiver    Family Member Consulted  Wife       Patient will benefit from skilled therapeutic intervention in order to improve the following deficits and impairments:  Abnormal gait, Decreased balance, Decreased mobility, Decreased endurance, Decreased range of motion,  Decreased strength, Difficulty walking, Impaired sensation, Pain  Visit Diagnosis: Right hemiparesis (HCC)  Other abnormalities of gait and mobility  Muscle weakness (generalized)  Unsteadiness on feet     Problem List Patient Active Problem List   Diagnosis Date Noted   Impaired ambulation 06/11/2019   Dependent on walker for ambulation 04/30/2019   Right hemiparesis ([facility]) 04/30/2019   Tachycardia 04/30/2019   Acute blood loss anemia    Uncontrolled type 2 diabetes mellitus with hyperglycemia (HCC)    Dry gangrene (HCC)    Leukocytosis    Embolic stroke ([facility]) 07/07/7626   Status post tracheostomy ([facility])    Pressure injury of skin 03/02/2019   On mechanically assisted ventilation ([facility])    Goals of care, counseling/discussion    Palliative care encounter    ARDS (adult respiratory distress syndrome) ([facility])    Cerebral embolism with cerebral infarction 02/17/2019   Acute respiratory failure with hypoxia ([facility])    Pneumothorax, right     Kerrie Pleasure 06/15/2019, 3:33 PM  [facility] [address] [place], [place], [address] Phone: [phone]   Fax:  [phone]  Name: Andrew Bruce MRN: [id number] Date of Birth: 08/05/1965

## 2019-06-16 ENCOUNTER — Telehealth: Payer: Self-pay | Admitting: Clinical

## 2019-06-16 NOTE — Telephone Encounter (Signed)
[facility]ated [facility]al Health Case Management Referral Note  06/16/2019 Name: Andrew Bruce MRN: [id number] DOB: 1965/08/09 Andrew Bruce is a 54 y.o. year old male who sees Patient, No Pcp Per for primary care. LCSW was consulted to assess patient's needs and assist the patient with Financial Difficulties related to lack of health coverage.  Assessment: Unsuccessful phone contact today. Patient experiencing Financial constraints related to lack of health coverage. Referred by PCP during last visit. Patient would like to apply for [facility] and was provided with an application at last appointment with PCP. CSW coordinated with [facility] and had patient scheduled for a financial assistance appointment there 07/05/19. CSW attempted to follow up with patient by phone with [place] interpreter today regarding the appointment and application. Interpreter assisted in leaving a voicemail. No return call from patient. Will continue to follow.  Review of patient status, including review of consultants reports, relevant laboratory and other test results, and collaboration with appropriate care team members and the patient's provider was performed as part of comprehensive patient evaluation and provision of services.    SDOH (Social Determinants of Health) assessments performed: No  Outpatient Encounter Medications as of 06/16/2019  Medication Sig  . acetaminophen (TYLENOL) 325 MG tablet Take 1-2 tablets (325-650 mg total) by mouth every 4 (four) hours as needed for mild pain.  . Amino Acids-Protein Hydrolys (FEEDING SUPPLEMENT, PRO-STAT SUGAR FREE 64,) LIQD Take 30 mLs by mouth 3 (three) times daily.  Marland Kitchen apixaban (ELIQUIS) 5 MG TABS tablet Take 1 tablet (5 mg total) by mouth 2 (two) times daily.  Marland Kitchen ascorbic acid (VITAMIN C) 500 MG tablet Take 1 tablet (500 mg total) by mouth daily.  Marland Kitchen atorvastatin (LIPITOR) 20 MG tablet Take 1 tablet (20 mg total) by mouth daily at 6 PM.  . blood glucose  meter kit and supplies KIT Dispense based on patient and insurance preference. Use up to four times daily as directed. (FOR ICD-9 250.00, 250.01).  . collagenase (SANTYL) ointment Apply topically daily. To sacrum and cover with damp to dry dressing  . doxazosin (CARDURA) 1 MG tablet Take 1 tablet (1 mg total) by mouth at bedtime.  . famotidine (PEPCID) 20 MG tablet Take 1 tablet (20 mg total) by mouth 2 (two) times daily.  Marland Kitchen glipiZIDE (GLUCOTROL) 5 MG tablet Take 0.5 tablets (2.5 mg total) by mouth daily before breakfast.  . guaiFENesin (ROBITUSSIN) 100 MG/5ML SOLN Take 10 mLs (200 mg total) by mouth every 6 (six) hours as needed for cough or to loosen phlegm. (Patient not taking: Reported on 06/10/2019)  . hydroxypropyl methylcellulose / hypromellose (ISOPTO TEARS / GONIOVISC) 2.5 % ophthalmic solution Place 1 drop into both eyes 4 (four) times daily as needed for dry eyes. (Patient not taking: Reported on 06/10/2019)  . iron polysaccharides (NIFEREX) 150 MG capsule Take 1 capsule (150 mg total) by mouth 2 (two) times daily.  . metFORMIN (GLUCOPHAGE) 500 MG tablet Take 1 tablet (500 mg total) by mouth 2 (two) times daily with a meal.  . metoprolol tartrate (LOPRESSOR) 25 MG tablet Take 0.5 tablets (12.5 mg total) by mouth 2 (two) times daily.  . potassium chloride (KLOR-CON) 10 MEQ tablet Take 1 tablet (10 mEq total) by mouth daily.  Marland Kitchen senna-docusate (SENOKOT-S) 8.6-50 MG tablet Place 1 tablet into feeding tube at bedtime as needed for mild constipation.  . traMADol (ULTRAM) 50 MG tablet Take 1 tablet (50 mg total) by mouth every 6 (six) hours as needed for moderate pain or  severe pain.  . traZODone (DESYREL) 50 MG tablet Take 0.5 tablets (25 mg total) by mouth at bedtime as needed for sleep.  Marland Kitchen zinc sulfate 220 (50 Zn) MG capsule Take 1 capsule (220 mg total) by mouth daily.   No facility-administered encounter medications on file as of 06/16/2019.    Goals Addressed   None      Follow up  Plan: 1. Will continue to outreach regarding orange card application and appointment.  Estanislado Emms, [facility] [phone]

## 2019-06-17 ENCOUNTER — Ambulatory Visit: Payer: Medicaid Other | Admitting: Occupational Therapy

## 2019-06-17 ENCOUNTER — Ambulatory Visit: Payer: Self-pay

## 2019-06-17 ENCOUNTER — Encounter: Payer: Self-pay | Admitting: Speech Pathology

## 2019-06-17 ENCOUNTER — Telehealth: Payer: Self-pay | Admitting: Clinical

## 2019-06-17 ENCOUNTER — Other Ambulatory Visit: Payer: Self-pay

## 2019-06-17 DIAGNOSIS — R278 Other lack of coordination: Secondary | ICD-10-CM

## 2019-06-17 DIAGNOSIS — M6281 Muscle weakness (generalized): Secondary | ICD-10-CM

## 2019-06-17 DIAGNOSIS — G8191 Hemiplegia, unspecified affecting right dominant side: Secondary | ICD-10-CM | POA: Diagnosis not present

## 2019-06-17 NOTE — Therapy (Signed)
[facility] [address] [place], [place], [address] Phone: [phone]   Fax:  [phone]  Occupational Therapy Treatment  Patient Details  Name: Andrew Bruce MRN: [id number] Date of Birth: [birth year] Referring Provider (OT): Delle Reining PA-C   Encounter Date: 06/17/2019  OT End of Session - 06/17/19 1607    Visit Number  6    Number of Visits  17    Date for OT Re-Evaluation  07/10/19    Authorization Type  ? MCD pending - pt did NOT have MCD at time of evaluation    OT Start Time  1533    OT Stop Time  1615    OT Time Calculation (min)  42 min    Activity Tolerance  Patient tolerated treatment well    Behavior During Therapy  WFL for tasks assessed/performed       Past Medical History:  Diagnosis Date  . COVID-19 02/06/2019    Past Surgical History:  Procedure Laterality Date  . DIRECT LARYNGOSCOPY Bilateral 04/06/2019   Procedure: MICRO DIRECT LARYNGOSCOPY WITH PROLARYN INJECTION;  Surgeon: Christia Reading, MD;  Location: [facility] OR;  Service: ENT;  Laterality: Bilateral;  . IR GASTROSTOMY TUBE MOD SED  03/10/2019  . IR GASTROSTOMY TUBE REMOVAL  04/20/2019    There were no vitals filed for this visit.  Subjective Assessment - 06/17/19 1538    Subjective   Denies pain today    Patient is accompanied by:  Interpreter   via [place]   Pertinent History  CVA w/ mild Rt hemiparesis secondary to COVID and ARF Jan 2021. 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    Limitations  no driving, no heavy lifting    Patient Stated Goals  go back to work    Currently in Pain?  No/denies                   OT Treatments/Exercises (OP) - 06/17/19 0001      Hand Exercises   Other Hand Exercises  Gripper set at level 2 resistance to pick up blocks Rt hand for sustained grip strength w/ min difficulty      Fine Motor Coordination  (Hand/Wrist)   Fine Motor Coordination  O'Connor pegs    Manipulation of small objects  Pt rotating stress balls in Rt hand w/ mod to max difficulty - pt improved w/ reps    Handwriting  Tracing capital letters w/ high lighter and working distal finger control worksheet for coordination, precision, and pre-writing ex's. Pt with max difficulty and extra time required    O'Connor pegs  Pt placing small O'connor pegs in pegboard Rt hand w/ min to mod difficulty and drops then removing with tweezers               OT Short Term Goals - 06/08/19 1408      OT SHORT TERM GOAL #1   Title  Independent with HEP for RT hand strength and coordination    Baseline  dependent - not yet issued    Time  4    Period  Weeks    Status  Achieved      OT SHORT TERM GOAL #2   Title  Pt to be supervision only for bathing while seated    Baseline  dependent    Time  4    Period  Weeks  Status  Achieved   in standing w/ grab bar     OT SHORT TERM GOAL #3   Title  Pt to perform 10 min of activity with only 1 rest break and no significant change in HR    Baseline  Pt gets SOB and requires frequent rest breaks    Time  4    Period  Weeks    Status  On-going   Pt standing for 10 minutes with O2 sats 94%     OT SHORT TERM GOAL #4   Title  Pt to improve grip strength Rt hand to 30 lbs, and Lt hand to 45 lbs    Baseline  Rt = 20, Lt = 38 lbs    Time  4    Period  Weeks    Status  On-going      OT SHORT TERM GOAL #5   Title  Improve coordination as evidenced by performing 9 hole peg test in 50 sec or less Rt dominant hand    Baseline  Rt = 63.94 sec    Time  4    Period  Weeks    Status  New        OT Long Term Goals - 05/10/19 1406      OT LONG TERM GOAL #1   Title  Independent with BUE HEP for strength and endurance    Baseline  dependent - not yet issued    Time  8    Period  Weeks    Status  New      OT LONG TERM GOAL #2   Title  Pt to return to simple cooking tasks for 15 min  or greater at mod I level    Baseline  dependent    Time  8    Period  Weeks    Status  New      OT LONG TERM GOAL #3   Title  Pt to return to light household work (taking out trash, washing dishes, etc) mod I level without rest breaks    Baseline  dependent    Time  8    Period  Weeks    Status  New      OT LONG TERM GOAL #4   Title  Pt to improve grip strength RT dominant hand to 40 lbs or greater    Baseline  Rt = 20 lbs    Time  8    Period  Weeks    Status  New      OT LONG TERM GOAL #5   Title  Pt to improve coordination as evidenced by performing 9 hole peg test in 40 sec or less Rt hand    Baseline  63.94 sec    Time  8    Period  Weeks    Status  New            Plan - 06/17/19 1608    Clinical Impression Statement  Pt steadily progressing with Rt hand coordination and strength. Pt w/ difficulty performing pre-writing ex's today    Occupational performance deficits (Please refer to evaluation for details):  ADL's;IADL's;Work;Leisure    Body Structure / Function / Physical Skills  ADL;ROM;IADL;Endurance;Body mechanics;Sensation;Mobility;Flexibility;Strength;Coordination;FMC;Pain;UE functional use;Decreased knowledge of use of DME    Rehab Potential  Good    OT Frequency  2x / week    OT Duration  8 weeks    OT Treatment/Interventions  Self-care/ADL training;Therapeutic exercise;Functional Mobility Training;Aquatic Therapy;Neuromuscular education;Manual Therapy;Energy conservation;Therapeutic  activities;Coping strategies training;DME and/or AE instruction;Cognitive remediation/compensation;Visual/perceptual remediation/compensation;Moist Heat;Passive range of motion;Patient/family education    Plan  letter cancellation, further assess vision prn, continue coordination and strength RUE, endurance/conditioning, monitor O2 sats    Consulted and Agree with Plan of Care  Patient       Patient will benefit from skilled therapeutic intervention in order to improve the  following deficits and impairments:   Body Structure / Function / Physical Skills: ADL, ROM, IADL, Endurance, Body mechanics, Sensation, Mobility, Flexibility, Strength, Coordination, FMC, Pain, UE functional use, Decreased knowledge of use of DME       Visit Diagnosis: Other lack of coordination  Muscle weakness (generalized)    Problem List Patient Active Problem List   Diagnosis Date Noted  . Impaired ambulation 06/11/2019  . Dependent on walker for ambulation 04/30/2019  . Right hemiparesis (HCC) 04/30/2019  . Tachycardia 04/30/2019  . Acute blood loss anemia   . Uncontrolled type 2 diabetes mellitus with hyperglycemia (HCC)   . Dry gangrene (HCC)   . Leukocytosis   . 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 Status post tracheostomy (HCC)   . Pressure injury of skin 03/02/2019  . On mechanically assisted ventilation (HCC)   . Goals of care, counseling/discussion   . Palliative care encounter   . ARDS (adult respiratory distress syndrome) (HCC)   . Cerebral embolism with cerebral infarction 02/17/2019  . Acute respiratory failure with hypoxia (HCC)   . Pneumothorax, right     Kelli Churn, OTR/L 06/17/2019, 4:17 PM  [facility] [address] [place], [place], [address] Phone: [phone]   Fax:  [phone]  Name: Andrew Bruce MRN: [id number] Date of Birth: 01-08-1966

## 2019-06-17 NOTE — Telephone Encounter (Addendum)
Integrated Behavioral Health General Follow Up Note  06/17/2019 Name: Andrew Bruce MRN: [id number] DOB: 10-Dec-1965 Andrew Bruce is a 54 y.o. year old male who sees Patient, No Pcp Per for primary care. LCSW was initially consulted to assist with financial Difficulties related to lack of health coverage.  Interpreter: Yes.     Interpreter Name & Language: [employer] [name] [phone] - Spanish   Ongoing Intervention: Patient experiencing financial constraints related to lack of health coverage. Patient was referred by Thad Ranger, NP after office visit 06/10/19. Scheduled patient an appointment for [facility] application at [facility] 07/05/19. Called patient with interpreter today and LVM about the appointment. Patient's daughter [name] called back and confirmed receipt of the message. However she indicated patient has a pending Medicaid application. Advised that patient will not be seen for [facility] application unless denied for Medicaid. Advised daughter to call back to CSW if patient receives Medicaid denial letter and patient will be rescheduled for [facility] application.  Review of patient status, including review of consultants reports, relevant laboratory and other test results, and collaboration with appropriate care team members and the patient's provider was performed as part of comprehensive patient evaluation and provision of services.     Follow up Plan: 1. Available from clinic as needed  Abigail Butts, LCSW [facility] [phone]

## 2019-06-18 ENCOUNTER — Ambulatory Visit: Payer: Self-pay

## 2019-06-22 ENCOUNTER — Ambulatory Visit: Payer: Medicaid Other | Admitting: Occupational Therapy

## 2019-06-22 ENCOUNTER — Other Ambulatory Visit: Payer: Self-pay

## 2019-06-22 ENCOUNTER — Encounter: Payer: Self-pay | Admitting: Occupational Therapy

## 2019-06-22 ENCOUNTER — Ambulatory Visit: Payer: Medicaid Other

## 2019-06-22 DIAGNOSIS — M6281 Muscle weakness (generalized): Secondary | ICD-10-CM

## 2019-06-22 DIAGNOSIS — R498 Other voice and resonance disorders: Secondary | ICD-10-CM

## 2019-06-22 DIAGNOSIS — G8191 Hemiplegia, unspecified affecting right dominant side: Secondary | ICD-10-CM | POA: Diagnosis not present

## 2019-06-22 DIAGNOSIS — R208 Other disturbances of skin sensation: Secondary | ICD-10-CM

## 2019-06-22 DIAGNOSIS — R2689 Other abnormalities of gait and mobility: Secondary | ICD-10-CM

## 2019-06-22 DIAGNOSIS — R2681 Unsteadiness on feet: Secondary | ICD-10-CM

## 2019-06-22 DIAGNOSIS — R1312 Dysphagia, oropharyngeal phase: Secondary | ICD-10-CM

## 2019-06-22 DIAGNOSIS — R490 Dysphonia: Secondary | ICD-10-CM

## 2019-06-22 DIAGNOSIS — R278 Other lack of coordination: Secondary | ICD-10-CM

## 2019-06-22 NOTE — Therapy (Signed)
[facility] [address] [place], [place], [address] Phone: [phone]   Fax:  [phone]  Occupational Therapy Treatment  Patient Details  Name: Andrew Bruce MRN: [id number] Date of Birth: 05-30-1965 Referring Provider (OT): Delle Reining PA-C   Encounter Date: 06/22/2019  OT End of Session - 06/22/19 1345    Visit Number  7    Number of Visits  17    Date for OT Re-Evaluation  07/10/19    Authorization Type  ? MCD pending - pt did NOT have MCD at time of evaluation    OT Start Time  1325    OT Stop Time  1358    OT Time Calculation (min)  33 min    Activity Tolerance  Patient tolerated treatment well    Behavior During Therapy  WFL for tasks assessed/performed       Past Medical History:  Diagnosis Date  . COVID-19 02/06/2019    Past Surgical History:  Procedure Laterality Date  . DIRECT LARYNGOSCOPY Bilateral 04/06/2019   Procedure: MICRO DIRECT LARYNGOSCOPY WITH PROLARYN INJECTION;  Surgeon: Christia Reading, MD;  Location: [facility] OR;  Service: ENT;  Laterality: Bilateral;  . IR GASTROSTOMY TUBE MOD SED  03/10/2019  . IR GASTROSTOMY TUBE REMOVAL  04/20/2019    There were no vitals filed for this visit.  Subjective Assessment - 06/22/19 1326    Subjective   Denies pain today    Patient is accompanied by:  Interpreter   via [facility]   Pertinent History  CVA w/ mild Rt hemiparesis secondary to COVID and ARF Jan 2021. 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    Limitations  no driving, no heavy lifting    Patient Stated Goals  go back to work    Currently in Pain?  Yes    Pain Score  5     Pain Location  Foot    Pain Orientation  Right;Left    Pain Descriptors / Indicators  Aching    Pain Type  Chronic pain    Pain Onset  More than a month ago    Pain Frequency  Intermittent    Aggravating Factors   walking    Pain  Relieving Factors  rest          Treatment: Number cancellation 30/80 missed for 1.5 M, majority were missed on right side, pt reports blurry vision. Pt reports he sees better at a distance, not up close.  Prewriting activities, tracing shapes, distal finger control worksheet using foam grip on pen, min-mod difficulty. Pt was able to print his name legibly. Purdue pegboard with RUE for increased fine motor coordination, min difficulty/ v.c                   OT Short Term Goals - 06/08/19 1408      OT SHORT TERM GOAL #1   Title  Independent with HEP for RT hand strength and coordination    Baseline  dependent - not yet issued    Time  4    Period  Weeks    Status  Achieved      OT SHORT TERM GOAL #2   Title  Pt to be supervision only for bathing while seated    Baseline  dependent    Time  4    Period  Weeks    Status  Achieved   in standing w/ grab bar     OT SHORT TERM GOAL #3   Title  Pt to perform 10 min of activity with only 1 rest break and no significant change in HR    Baseline  Pt gets SOB and requires frequent rest breaks    Time  4    Period  Weeks    Status  On-going   Pt standing for 10 minutes with O2 sats 94%     OT SHORT TERM GOAL #4   Title  Pt to improve grip strength Rt hand to 30 lbs, and Lt hand to 45 lbs    Baseline  Rt = 20, Lt = 38 lbs    Time  4    Period  Weeks    Status  On-going      OT SHORT TERM GOAL #5   Title  Improve coordination as evidenced by performing 9 hole peg test in 50 sec or less Rt dominant hand    Baseline  Rt = 63.94 sec    Time  4    Period  Weeks    Status  New        OT Long Term Goals - 05/10/19 1406      OT LONG TERM GOAL #1   Title  Independent with BUE HEP for strength and endurance    Baseline  dependent - not yet issued    Time  8    Period  Weeks    Status  New      OT LONG TERM GOAL #2   Title  Pt to return to simple cooking tasks for 15 min or greater at mod I level    Baseline   dependent    Time  8    Period  Weeks    Status  New      OT LONG TERM GOAL #3   Title  Pt to return to light household work (taking out trash, washing dishes, etc) mod I level without rest breaks    Baseline  dependent    Time  8    Period  Weeks    Status  New      OT LONG TERM GOAL #4   Title  Pt to improve grip strength RT dominant hand to 40 lbs or greater    Baseline  Rt = 20 lbs    Time  8    Period  Weeks    Status  New      OT LONG TERM GOAL #5   Title  Pt to improve coordination as evidenced by performing 9 hole peg test in 40 sec or less Rt hand    Baseline  63.94 sec    Time  8    Period  Weeks    Status  New            Plan - 06/22/19 1346    Clinical Impression Statement  Pt is progressing toward goals. He missed grossly30/ 80 items on number cancellation.    Occupational performance deficits (Please refer to evaluation for details):  ADL's;IADL's;Work;Leisure    Body Structure / Function / Physical Skills  ADL;ROM;IADL;Endurance;Body mechanics;Sensation;Mobility;Flexibility;Strength;Coordination;FMC;Pain;UE functional use;Decreased knowledge of use of DME    Rehab Potential  Good    OT Frequency  2x / week    OT Duration  8 weeks    OT Treatment/Interventions  Self-care/ADL training;Therapeutic exercise;Functional Mobility Training;Aquatic Therapy;Neuromuscular education;Manual Therapy;Energy conservation;Therapeutic activities;Coping strategies training;DME and/or AE  instruction;Cognitive remediation/compensation;Visual/perceptual remediation/compensation;Moist Heat;Passive range of motion;Patient/family education    Plan  further assess vision prn, continue coordination and strength RUE, endurance/conditioning, monitor O2 sats    Consulted and Agree with Plan of Care  Patient       Patient will benefit from skilled therapeutic intervention in order to improve the following deficits and impairments:   Body Structure / Function / Physical Skills: ADL,  ROM, IADL, Endurance, Body mechanics, Sensation, Mobility, Flexibility, Strength, Coordination, FMC, Pain, UE functional use, Decreased knowledge of use of DME       Visit Diagnosis: Other lack of coordination  Muscle weakness (generalized)  Other disturbances of skin sensation    Problem List Patient Active Problem List   Diagnosis Date Noted  . Impaired ambulation 06/11/2019  . Dependent on walker for ambulation 04/30/2019  . Right hemiparesis ([place]) 04/30/2019  . Tachycardia 04/30/2019  . Acute blood loss anemia   . Uncontrolled type 2 diabetes mellitus with hyperglycemia ([place])   . Dry gangrene ([place])   . Leukocytosis   . Embolic stroke ([place]) 53/61/4431  . Status post tracheostomy ([place])   . Pressure injury of skin 03/02/2019  . On mechanically assisted ventilation ([place])   . Goals of care, counseling/discussion   . Palliative care encounter   . ARDS (adult respiratory distress syndrome) ([place])   . Cerebral embolism with cerebral infarction 02/17/2019  . Acute respiratory failure with hypoxia ([place])   . Pneumothorax, right     Westlee Devita 06/22/2019, 1:49 PM  [facility] [address] [place], [place], [address] Phone: [phone]   Fax:  [phone]  Name: Andrew Bruce MRN: [id number] Date of Birth: [birth year]

## 2019-06-22 NOTE — Therapy (Signed)
[facility] [address] [place], [place], [address] Phone: [phone]   Fax:  [phone]  Speech Language Pathology Treatment  Patient Details  Name: Andrew Bruce MRN: [id number] Date of Birth: 09-10-1965 Referring Provider (SLP): Dionisio David, NP   Encounter Date: 06/22/2019  End of Session - 06/22/19 1712    Visit Number  5    Number of Visits  17    Date for SLP Re-Evaluation  08/10/19    SLP Start Time  0347    SLP Stop Time   1445    SLP Time Calculation (min)  40 min    Activity Tolerance  Patient tolerated treatment well       Past Medical History:  Diagnosis Date   COVID-19 02/06/2019    Past Surgical History:  Procedure Laterality Date   DIRECT LARYNGOSCOPY Bilateral 04/06/2019   Procedure: MICRO DIRECT LARYNGOSCOPY WITH PROLARYN INJECTION;  Surgeon: Melida Quitter, MD;  Location: [facility];  Service: ENT;  Laterality: Bilateral;   IR GASTROSTOMY TUBE MOD SED  03/10/2019   IR GASTROSTOMY TUBE REMOVAL  04/20/2019    There were no vitals filed for this visit.         ADULT SLP TREATMENT - 06/22/19 1705      General Information   Behavior/Cognition  Alert;Cooperative;Pleasant mood      Treatment Provided   Treatment provided  Cognitive-Linquistic      Cognitive-Linquistic Treatment   Treatment focused on  Voice;Patient/family/caregiver education    Skilled Treatment  Pt with EMST and IMST, not performing correctly. SLP said for pt to manually plug nose and take full breath (or empty his lungs) and blow hard and fast (or suck in hard and fast). With fading cues from occasional mod to rare min, pt was able to improve his accuracy with correct production. SLP suggested with EMST pt do 10 reps, take a 15 second break, then do 5 more due to hyperventilating. Pt told SLP he is no longer coughing with POs. Education re: movement or non-movement of lt paralyzed fold and how this affects ability to do  vocal adduction exercise. Pt voice appeared less breathy, and stronger after adduction exercises but quickly became more breathy after ~60 total seconds of vocaliztion in conversation.SLP reminded pt of vocal hygiene measures - unsure if pt voluntarily following these at home.       Assessment / Recommendations / Plan   Plan  Continue with current plan of care   when pt requires min/no cues-decr frequency;pt agrees     Progression Toward Goals   Progression toward goals  Progressing toward goals       SLP Education - 06/22/19 1710    Education Details  HEP for IMST, EMST; HEP for vocal adduction, vocal hygiene    Person(s) Educated  Patient    Methods  Explanation;Demonstration;Verbal cues    Comprehension  Verbalized understanding;Returned demonstration;Verbal cues required;Need further instruction         SLP Long Term Goals - 06/22/19 1714      SLP LONG TERM GOAL #1   Title  pt will report following 3 aspects of vocal conservation program between 4 sessions    Baseline  no aspects    Time  5    Period  Weeks   or visits, for all LTGs   Status  On-going      SLP LONG TERM GOAL #2   Title  Pt will complete exercises for vocal fold adduction with  rare min A x3 visits.    Baseline  not provided yet; 06-22-19    Time  5    Period  Weeks    Status  Revised      SLP LONG TERM GOAL #3   Title  pt will use abdominal breathing in 5 minutes simple conversation in 3 sessions    Baseline  chest breathing    Time  5    Period  Weeks    Status  On-going      SLP LONG TERM GOAL #4   Title  pt will use WNL vocal effort without strain/pushing for vocalization with rare min A during 3 ST sessions    Baseline  strainedpushed until SPL told pt not to do so    Time  5    Period  Weeks    Status  On-going      SLP LONG TERM GOAL #5   Title  pt will rate his voice at least 7/10 (where 10=WNL voice)    Baseline  5/10    Time  6    Period  Weeks    Status  On-going      SLP LONG  TERM GOAL #6   Title  Patient will tell SLP 3 signs of aspiration PNA.    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 Today SLP trained pt with IMST and EMST exercises, and reviewed vocal fold adduction exercises. Pt denies ongoing coughing/choking with POs at this time. SLP believes pt will benefit from at least x1/week ST for exercises for vocal fold adduction, vocal hygiene and education/training on abdominal breathing to maximize breath support for speech.    Speech Therapy Frequency  1x /week    Duration  --   8 weeks   Treatment/Interventions  Functional tasks;Internal/external aids;Patient/family education;Compensatory strategies;SLP instruction and feedback;Other (comment);Aspiration precaution training;Pharyngeal strengthening exercises;Diet toleration management by SLP;Trials of upgraded texture/liquids   vocal hygiene program, vocal function/adduction exercises   Potential to Achieve Goals  Good   tentative potential- without ENT consult difficult to ascertain   Potential Considerations  Financial resources    SLP Home Exercise Plan  provided today    Consulted and Agree with Plan of Care  Patient       Patient will benefit from skilled therapeutic intervention in order to improve the following deficits and impairments:   Other voice and resonance disorders  Hoarseness of voice  Dysphagia, oropharyngeal phase    Problem List Patient Active Problem List   Diagnosis Date Noted   Impaired ambulation 06/11/2019   Dependent on walker for ambulation 04/30/2019   Right hemiparesis (HCC)  04/30/2019   Tachycardia 04/30/2019   Acute blood loss anemia    Uncontrolled type 2 diabetes mellitus with hyperglycemia (HCC)    Dry gangrene (HCC)    Leukocytosis    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   Status post tracheostomy (HCC)    Pressure injury of skin 03/02/2019  On mechanically assisted ventilation (HCC)    Goals of care, counseling/discussion    Palliative care encounter    ARDS (adult respiratory distress syndrome) (HCC)    Cerebral embolism with cerebral infarction 02/17/2019   Acute respiratory failure with hypoxia (HCC)    Pneumothorax, right     [facility] ,MS, CCC-SLP  06/22/2019, 5:15 PM  [facility] [address] [place], [place], [address] Phone: [phone]   Fax:  [phone]   Name: Dontee Jaso MRN: [id number] Date of Birth: 11-19-1965

## 2019-06-22 NOTE — Therapy (Signed)
[facility] [address] [place], [place], [address] Phone: [phone]   Fax:  [phone]  Physical Therapy Treatment  Patient Details  Name: Andrew Bruce MRN: [id number] Date of Birth: 1965/05/13 Referring Provider (PT): Reesa Chew, [place]   Encounter Date: 06/22/2019  PT End of Session - 06/22/19 1712    Visit Number  6    Number of Visits  9    Date for PT Re-Evaluation  07/05/19    Authorization Type  Eval: 05/10/19 (medicaid)    Progress Note Due on Visit  8    PT Start Time  1500    PT Stop Time  1545    PT Time Calculation (min)  45 min    Equipment Utilized During Treatment  Gait belt    Activity Tolerance  Patient tolerated treatment well    Behavior During Therapy  [facility] for tasks assessed/performed       Past Medical History:  Diagnosis Date  . COVID-19 02/06/2019    Past Surgical History:  Procedure Laterality Date  . DIRECT LARYNGOSCOPY Bilateral 04/06/2019   Procedure: MICRO DIRECT LARYNGOSCOPY WITH PROLARYN INJECTION;  Surgeon: Melida Quitter, MD;  Location: [facility];  Service: ENT;  Laterality: Bilateral;  . IR GASTROSTOMY TUBE MOD SED  03/10/2019  . IR GASTROSTOMY TUBE REMOVAL  04/20/2019    There were no vitals filed for this visit.  Subjective Assessment - 06/22/19 1510    Subjective  He walks 1 hour everyday. He is able to go up and down stairs. Toes hurt a little today of 3/10.    Patient is accompained by:  Interpreter    Pertinent History  COVID in patient stay, hx of CVA, hx of PE, gangerene of toes    Limitations  Standing;Walking;House hold activities    How long can you stand comfortably?  5-10 min    How long can you walk comfortably?  inside home only    Patient Stated Goals  get stronger               Deep friction massage to proximal biceps tendon: 2', demo pt how to do self massage. Discussed how to do wall slides into scaptions with hands behind back internal rotation  stretch. Discussed with patient not to stretch into pain to avoid soreness.  Resisted walking: black sport cord Fwd with box tap at end of the walk: 5x R and L, total 10 laps Lateral with box tap at end of the walk: 5x R and L Bwd: 5x  Standing hip flexion, abduction, extension: 10x each facing fwd, bwd, laterally with black sport cord pulling with moderate resistance                     PT Short Term Goals - 06/01/19 1019      PT SHORT TERM GOAL #1   Title  Patient will be able to ambulate 200 feet with cane or no AD for 200 feet to improve household ambulation    Baseline  2 ww walker (eval); 220 feet without AD (06/01/19)    Time  3    Period  Weeks    Status  Achieved    Target Date  05/31/19      PT SHORT TERM GOAL #2   Title  Pt will be able to perform 5 sit to stands without use of UE for support to improve functional strength    Baseline  must use bil hands for  support (Eval); 5x sit to stand in 23 seconds    Time  3    Period  Weeks    Status  Achieved    Target Date  05/31/19      PT SHORT TERM GOAL #3   Title  Patient will be able to go up and down curb and ramp with cane of no AD to improve comminty navigation    Baseline  Pt able to go up and down ramp and curb: 3x with SBA    Time  3    Period  Weeks    Status  Achieved    Target Date  05/31/19        PT Long Term Goals - 06/01/19 1029      PT LONG TERM GOAL #1   Title  Pt will be able to ambulate with cane or lesser AD for >1500 feet to improve community ambulation.    Baseline  must use 2 ww walker, 1025' without AD (06/01/19)    Time  8    Period  Weeks    Status  Partially Met    Target Date  07/05/19      PT LONG TERM GOAL #2   Title  Pt will be able to stand/walk for >1 hour to improve ability to go shopping    Baseline  5-10 min (eval); 30 min (06/01/19)    Time  8    Period  Weeks    Status  New    Target Date  07/05/19      PT LONG TERM GOAL #3   Title  Pt will demo 56/56  on BBS to improve overall balance and reduce fall risk    Baseline  52/56    Time  8    Period  Weeks    Status  New    Target Date  07/05/19            Plan - 06/22/19 1711    Clinical Impression Statement  Pt demonstrated fatigue with exercises today. We worked on only taking one sitting rest break during the session to work on improving standing/walking endurance.    Personal Factors and Comorbidities  Comorbidity 2;Time since onset of injury/illness/exacerbation;Transportation    Comorbidities  Hx of CVA, Gangerene of toes    Examination-Activity Limitations  Bathing;Hygiene/Grooming;Patent attorney for Others    Examination-Participation Restrictions  Community Activity;Driving;Laundry;Yard Work;Shop;Meal Prep    Stability/Clinical Decision Making  Evolving/Moderate complexity    Rehab Potential  Good    PT Frequency  1x / week    PT Duration  8 weeks    PT Treatment/Interventions  ADLs/Self Care Home Management;Gait training;Stair training;Functional mobility training;Therapeutic exercise;Therapeutic activities;Balance training;Patient/family education;Orthotic Fit/Training;Passive range of motion;Manual techniques;Energy conservation;Visual/perceptual remediation/compensation;Joint Manipulations    PT Next Visit Plan  Review partial tandem on floor with head turns- progress it to non compliant surface.    PT Home Exercise Plan  Access Code: [email or web address]: [email or web address]: 04/26/2021Prepared by: Gwenyth Bouillon PatelExercisesStanding Gastroc Stretch on Foam 1/2 Roll - 3 x daily - 7 x weekly - 1 sets - 3 reps - 30 hold    Consulted and Agree with Plan of Care  Patient;Family member/caregiver    Family Member Consulted  Wife       Patient will benefit from skilled therapeutic intervention in order to improve the following deficits and impairments:  Abnormal gait, Decreased balance, Decreased mobility, Decreased endurance, Decreased range of motion, Decreased  strength, Difficulty walking, Impaired sensation, Pain  Visit  Diagnosis: Muscle weakness (generalized)  Right hemiparesis (HCC)  Other abnormalities of gait and mobility  Unsteadiness on feet     Problem List Patient Active Problem List   Diagnosis Date Noted  . Impaired ambulation 06/11/2019  . Dependent on walker for ambulation 04/30/2019  . Right hemiparesis ([place]) 04/30/2019  . Tachycardia 04/30/2019  . Acute blood loss anemia   . Uncontrolled type 2 diabetes mellitus with hyperglycemia ([place])   . Dry gangrene ([place])   . Leukocytosis   . Embolic stroke ([place]) 69/24/9324  . Status post tracheostomy ([place])   . Pressure injury of skin 03/02/2019  . On mechanically assisted ventilation ([place])   . Goals of care, counseling/discussion   . Palliative care encounter   . ARDS (adult respiratory distress syndrome) ([place])   . Cerebral embolism with cerebral infarction 02/17/2019  . Acute respiratory failure with hypoxia ([place])   . Pneumothorax, right     Kerrie Pleasure 06/22/2019, 5:12 PM  [facility] [address] [place], [place], [address] Phone: [phone]   Fax:  [phone]  Name: Andrew Bruce MRN: [id number] Date of Birth: [birth year]

## 2019-06-24 ENCOUNTER — Ambulatory Visit: Payer: Self-pay

## 2019-06-24 ENCOUNTER — Other Ambulatory Visit: Payer: Self-pay

## 2019-06-24 ENCOUNTER — Ambulatory Visit: Payer: Medicaid Other | Admitting: Occupational Therapy

## 2019-06-24 ENCOUNTER — Encounter: Payer: Self-pay | Admitting: Speech Pathology

## 2019-06-24 DIAGNOSIS — M6281 Muscle weakness (generalized): Secondary | ICD-10-CM

## 2019-06-24 DIAGNOSIS — G8191 Hemiplegia, unspecified affecting right dominant side: Secondary | ICD-10-CM | POA: Diagnosis not present

## 2019-06-24 DIAGNOSIS — R208 Other disturbances of skin sensation: Secondary | ICD-10-CM

## 2019-06-24 DIAGNOSIS — R278 Other lack of coordination: Secondary | ICD-10-CM

## 2019-06-24 NOTE — Therapy (Signed)
[facility] [address] [place], [place], [address] Phone: [phone]   Fax:  [phone]  Occupational Therapy Treatment  Patient Details  Name: Andrew Bruce MRN: [id number] Date of Birth: 04/07/1965 Referring Provider (OT): Reesa Chew PA-C   Encounter Date: 06/24/2019   OT End of Session - 06/24/19 0900    Visit Number 8    Number of Visits 17    Date for OT Re-Evaluation 07/10/19    Authorization Type ? MCD pending - pt did NOT have MCD at time of evaluation    OT Start Time 1021    OT Stop Time 1100    OT Time Calculation (min) 39 min    Activity Tolerance Patient tolerated treatment well    Behavior During Therapy WFL for tasks assessed/performed           Past Medical History:  Diagnosis Date  . COVID-19 02/06/2019    Past Surgical History:  Procedure Laterality Date  . DIRECT LARYNGOSCOPY Bilateral 04/06/2019   Procedure: MICRO DIRECT LARYNGOSCOPY WITH PROLARYN INJECTION;  Surgeon: Melida Quitter, MD;  Location: [facility];  Service: ENT;  Laterality: Bilateral;  . IR GASTROSTOMY TUBE MOD SED  03/10/2019  . IR GASTROSTOMY TUBE REMOVAL  04/20/2019    There were no vitals filed for this visit.   Subjective Assessment - 06/24/19 0939    Subjective  Denies pain today    Patient is accompanied by: Interpreter   via Bethanie Dicker   Pertinent History CVA w/ mild Rt hemiparesis secondary to COVID and ARF Jan 2021. Pt intubated 02/07/19 and trachecd 07/26/43 - ICU complicated by PE and multiple CVA'S, gangrene of toes Lt 1-4 and Rt 1, 2, 3, 5.    Limitations no driving, no heavy lifting    Patient Stated Goals go back to work    Currently in Pain? Yes    Pain Score 8     Pain Location Shoulder    Pain Orientation Right    Pain Descriptors / Indicators Aching    Pain Type Acute pain    Pain Onset In the past 7 days    Pain Frequency Intermittent    Aggravating Factors  malpositioning    Pain Relieving Factors  repositioning    Multiple Pain Sites Yes    Pain Score 5    Pain Location Foot    Pain Orientation Right;Left    Pain Descriptors / Indicators Aching    Pain Type Chronic pain    Pain Onset More than a month ago    Pain Frequency Constant    Aggravating Factors  walking    Pain Relieving Factors rest              Treatment: UE ranger, mid range for RUE, min-mod facilitation to scapula, no reports of pain. Therapist  checked grip strength and 9 hole peg test, pt met these short term goals. Fine motor coordination task to copy small peg design, mod difficulty / drops. Pt reports no pain in shoulder at end of session.                  OT Education - 06/24/19 0900    Education Details Reviewed Supine cane ex for shoulder flexion, chest press, then seated unilateral shoulder abduction, closed chain shoulder flexion low range holding cane, and closed chain chest press with cane, added shoulder extension in standing  15 reps each, min facilitation/ v.c for proper positioning to minimize pain  Person(s) Educated Patient;Child(ren) Status interpreter   Methods Explanation;Demonstration, verbalized understanding   Comprehension Verbalized understanding;Returned demonstration;Verbal cues required            OT Short Term Goals - 06/24/19 0924      OT SHORT TERM GOAL #1   Title Independent with HEP for RT hand strength and coordination    Baseline dependent - not yet issued    Time 4    Period Weeks    Status Achieved      OT SHORT TERM GOAL #2   Title Pt to be supervision only for bathing while seated    Baseline dependent    Time 4    Period Weeks    Status Achieved   in standing w/ grab bar     OT SHORT TERM GOAL #3   Title Pt to perform 10 min of activity with only 1 rest break and no significant change in HR    Baseline Pt gets SOB and requires frequent rest breaks    Time 4    Period Weeks    Status On-going   Pt standing for 10 minutes with O2 sats  94%     OT SHORT TERM GOAL #4   Title Pt to improve grip strength Rt hand to 30 lbs, and Lt hand to 45 lbs    Baseline Rt = 20, Lt = 38 lbs    Time 4    Period Weeks    Status Achieved   36.8-RUE, 45.4-LUE     OT SHORT TERM GOAL #5   Title Improve coordination as evidenced by performing 9 hole peg test in 50 sec or less Rt dominant hand    Baseline Rt = 63.94 sec    Time 4    Period Weeks    Status Achieved   46.25            OT Long Term Goals - 05/10/19 1406      OT LONG TERM GOAL #1   Title Independent with BUE HEP for strength and endurance    Baseline dependent - not yet issued    Time 8    Period Weeks    Status New      OT LONG TERM GOAL #2   Title Pt to return to simple cooking tasks for 15 min or greater at mod I level    Baseline dependent    Time 8    Period Weeks    Status New      OT LONG TERM GOAL #3   Title Pt to return to light household work (taking out trash, washing dishes, etc) mod I level without rest breaks    Baseline dependent    Time 8    Period Weeks    Status New      OT LONG TERM GOAL #4   Title Pt to improve grip strength RT dominant hand to 40 lbs or greater    Baseline Rt = 20 lbs    Time 8    Period Weeks    Status New      OT LONG TERM GOAL #5   Title Pt to improve coordination as evidenced by performing 9 hole peg test in 40 sec or less Rt hand    Baseline 63.94 sec    Time 8    Period Weeks    Status New                 Plan - 06/24/19  1135    Clinical Impression Statement Pt is progressing towards goals with improving grip strenght and fine motor coordination. Pt met short term goals.    Occupational performance deficits (Please refer to evaluation for details): ADL's;IADL's;Work;Leisure    Body Structure / Function / Physical Skills ADL;ROM;IADL;Endurance;Body mechanics;Sensation;Mobility;Flexibility;Strength;Coordination;FMC;Pain;UE functional use;Decreased knowledge of use of DME    Rehab Potential Good     OT Frequency 2x / week    OT Duration 8 weeks    OT Treatment/Interventions Self-care/ADL training;Therapeutic exercise;Functional Mobility Training;Aquatic Therapy;Neuromuscular education;Manual Therapy;Energy conservation;Therapeutic activities;Coping strategies training;DME and/or AE instruction;Cognitive remediation/compensation;Visual/perceptual remediation/compensation;Moist Heat;Passive range of motion;Patient/family education    Plan coordination, RUE NMR /pain reduction    Consulted and Agree with Plan of Care Patient           Patient will benefit from skilled therapeutic intervention in order to improve the following deficits and impairments:   Body Structure / Function / Physical Skills: ADL, ROM, IADL, Endurance, Body mechanics, Sensation, Mobility, Flexibility, Strength, Coordination, FMC, Pain, UE functional use, Decreased knowledge of use of DME       Visit Diagnosis: Other lack of coordination  Muscle weakness (generalized)  Other disturbances of skin sensation  Right hemiparesis ([facility])    Problem List Patient Active Problem List   Diagnosis Date Noted  . Impaired ambulation 06/11/2019  . Dependent on walker for ambulation 04/30/2019  . Right hemiparesis ([facility]) 04/30/2019  . Tachycardia 04/30/2019  . Acute blood loss anemia   . Uncontrolled type 2 diabetes mellitus with hyperglycemia ([facility])   . 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 Leukocytosis   . Embolic stroke ([facility]) 01/75/1025  . Status post tracheostomy ([facility])   . Pressure injury of skin 03/02/2019  . On mechanically assisted ventilation ([facility])   . Goals of care, counseling/discussion   . Palliative care encounter   . ARDS (adult respiratory distress syndrome) ([facility])   . Cerebral embolism with cerebral infarction 02/17/2019  . Acute respiratory failure with hypoxia ([facility])   . Pneumothorax, right     Lyza Houseworth 06/24/2019, 11:37 AM  [facility] [address]  [place], [place], [address] Phone: [phone]   Fax:  [phone]  Name: Andrew Bruce MRN: [id number] Date of Birth: December 23, 1965

## 2019-06-25 ENCOUNTER — Encounter: Payer: Self-pay | Admitting: Physical Medicine and Rehabilitation

## 2019-06-25 ENCOUNTER — Other Ambulatory Visit: Payer: Self-pay

## 2019-06-25 ENCOUNTER — Encounter
Payer: Medicaid Other | Attending: Physical Medicine and Rehabilitation | Admitting: Physical Medicine and Rehabilitation

## 2019-06-25 VITALS — BP 144/98 | HR 85 | Temp 97.7°F | Ht 66.0 in | Wt 163.0 lb

## 2019-06-25 DIAGNOSIS — J3800 Paralysis of vocal cords and larynx, unspecified: Secondary | ICD-10-CM | POA: Diagnosis not present

## 2019-06-25 DIAGNOSIS — I96 Gangrene, not elsewhere classified: Secondary | ICD-10-CM | POA: Diagnosis not present

## 2019-06-25 DIAGNOSIS — I6340 Cerebral infarction due to embolism of unspecified cerebral artery: Secondary | ICD-10-CM | POA: Diagnosis not present

## 2019-06-25 DIAGNOSIS — G8191 Hemiplegia, unspecified affecting right dominant side: Secondary | ICD-10-CM | POA: Insufficient documentation

## 2019-06-25 DIAGNOSIS — Z9989 Dependence on other enabling machines and devices: Secondary | ICD-10-CM | POA: Diagnosis not present

## 2019-06-25 DIAGNOSIS — E1165 Type 2 diabetes mellitus with hyperglycemia: Secondary | ICD-10-CM | POA: Diagnosis not present

## 2019-06-25 DIAGNOSIS — R000 Tachycardia, unspecified: Secondary | ICD-10-CM | POA: Diagnosis not present

## 2019-06-25 DIAGNOSIS — R262 Difficulty in walking, not elsewhere classified: Secondary | ICD-10-CM

## 2019-06-25 MED ORDER — TRAMADOL HCL 50 MG PO TABS: 50.0000 mg | ORAL_TABLET | Freq: Four times a day (QID) | ORAL | 3 refills | Status: DC | PRN

## 2019-06-25 NOTE — Patient Instructions (Signed)
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  Here for hospital f/u.  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 Still hoarse, quiet voice  1. Renewed Tramadol q6 hours prn- #120- 3 RFs  2. Can cut toenails- be CAREFUL  3. Use vaseline on toes 2x/day to moisturize  4. Suggest using vitamin E 1-2x/day on toes- to help with healing.  Put on Vit E first and cover with vaseline.   5. Continue therapies with goal to improve pt's function to Mod I with dressing, writing, and bathing and fine motor control.   6. F/U 2 months

## 2019-06-25 NOTE — Progress Notes (Signed)
Subjective:    Patient ID: [name], male    DOB: [birth year], 54 y.o.   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    Here for f/u-  Sometimes toes hurt 50/50.   Taking pain meds (tramadol)- only when it hurts bad.  Still has some- was given 120 tabs 2 months ago- and hasn't run out yet.    Stopped using RW ~ 2 weeks ago.  Using a cane sometimes-  Therapists told him ot walk and not use RW as much-   Using cane for long distances.   Still doing outpt therapy- OT, PT and SLP still.   When showers, toes will start to peel- esp the big toe.Isn't using cream on feet/toes currently    Still taking pain meds   Needs some paperwork filled out.  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   Unable to button up shirts/shorts- wearing tshirts and sweats as a result-  Wears only sandals, but couldn't tie shoes.   Still cannot open up a bottle/water bottle/coke bottles.  Can go up and down stairs, but cannot write more than his name yet.      Pain Inventory Average Pain 5 Pain Right Now 5 My pain is intermittent  In the last 24 hours, has pain interfered with the following? General activity 6 Relation with others 0 Enjoyment of life 0 What TIME of day is your pain at its worst? daytime Sleep (in general) Fair  Pain is worse with: walking Pain improves with: rest Relief from Meds: na  Mobility walk without assistance  Function not employed: date last employed .  Neuro/Psych No problems in this area  Prior Studies Any changes since last visit?  no  Physicians involved in your  care Any changes since last visit?  no   Family History  Problem Relation Age of Onset  . Healthy Sister   . Healthy Brother    Social History   Socioeconomic History  . Marital status: Married    Spouse name: Not on file  . Number of children: Not on file  . Years of education: Not on file  . Highest education level: Not on file  Occupational History  . Not on file  Tobacco Use  . Smoking status: Former Research scientist (life sciences)  . Smokeless tobacco: Never Used  Vaping Use  . Vaping Use: Never used  Substance and Sexual Activity  . Alcohol use: Yes  . Drug use: Never  . Sexual activity: Yes  Other Topics Concern  . Not on file  Social History Narrative  . Not on file   Social Determinants of Health   Financial Resource Strain:   . Difficulty of Paying Living Expenses:   Food Insecurity:   . Worried About Charity fundraiser in the Last Year:   . Arboriculturist in the Last Year:   Transportation Needs:   . Film/video editor (Medical):   Marland Kitchen Lack of Transportation (Non-Medical):   Physical Activity:   . Days of Exercise per Week:   .  Minutes of Exercise per Session:   Stress:   . Feeling of Stress :   Social Connections:   . Frequency of Communication with Friends and Family:   . Frequency of Social Gatherings with Friends and Family:   . Attends Religious Services:   . Active Member of Clubs or Organizations:   . Attends Banker Meetings:   Marland Kitchen Marital Status:    Past Surgical History:  Procedure Laterality Date  . DIRECT LARYNGOSCOPY Bilateral 04/06/2019   Procedure: MICRO DIRECT LARYNGOSCOPY WITH PROLARYN INJECTION;  Surgeon: Christia Reading, MD;  Location: [facility] OR;  Service: ENT;  Laterality: Bilateral;  . IR GASTROSTOMY TUBE MOD SED  03/10/2019  . IR GASTROSTOMY TUBE REMOVAL  04/20/2019   Past Medical History:  Diagnosis Date  . COVID-19 02/06/2019   There were no vitals taken for this visit.  Opioid Risk Score:   Fall Risk Score:  `1  Depression screen  PHQ 2/9  Depression screen [facility] 2/9 06/10/2019 05/13/2019  Decreased Interest 0 0  Down, Depressed, Hopeless 0 0  PHQ - 2 Score 0 0   Review of Systems  Constitutional: Negative.   HENT: Negative.   Eyes: Negative.   Respiratory: Negative.   Cardiovascular: Positive for leg swelling.  Gastrointestinal: Negative.   Endocrine: Negative.   Genitourinary: Negative.   Musculoskeletal: Negative.   Skin:       Toes on both feet are discolored  Allergic/Immunologic: Negative.   Neurological: Negative.   Hematological: Bruises/bleeds easily.       Eliquis  Psychiatric/Behavioral: Negative.   All other systems reviewed and are negative.      Objective:   Physical Exam Awake, alert, appropriate, accompanied by son; walking with sandals on-  NAD R 1st/2nd toes are still gangrenous L- 1st and 3rd and little tip of 4th toe- still gangrenous- smaller/atrophied compared to non-gangrenous toes.  MS UEs- deltoids 4/5, biceps 4+/5, triceps 4+/5, WE 4+/5, grip 4/5, finger abd 3/5 LEs- HF 4/5 on R 4+/5 on L; KE/KF 4/5 B/L, DF and PF 4+/5 B/L Neuro: Sensation decreased/to absent on R 1-3rd digits (median neuropathy) and L lateral 4th and 5th digits (ulnar neuropathy) Nml sensation on LEs except B/L all 10 toes are decreased to absent B/L  Very decreased rapid alternating movements in hands and unable ot touch thumb to all fingers of hand at all B/L Still hoarse, quiet voice, but better than hospital.  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  Here for hospital f/u.  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 Still hoarse, quiet voice  1. Renewed Tramadol q6 hours prn- #120- 3 RFs  2. Can cut toenails- be CAREFUL  3. Use vaseline on  toes 2x/day to moisturize  4. Suggest using vitamin E 1-2x/day on toes- to help with healing.  Put on Vit E first and cover with vaseline.   5. Continue therapies with goal to improve pt's function to Mod I with dressing, writing, and bathing and fine motor control.   6. F/U 2 months   I spent a total of 30 minutes on visit- as detailed above.  Marland Kitchen

## 2019-06-29 ENCOUNTER — Other Ambulatory Visit: Payer: Self-pay

## 2019-06-29 ENCOUNTER — Ambulatory Visit: Payer: Medicaid Other | Admitting: Occupational Therapy

## 2019-06-29 ENCOUNTER — Ambulatory Visit: Payer: Self-pay

## 2019-06-29 DIAGNOSIS — G8191 Hemiplegia, unspecified affecting right dominant side: Secondary | ICD-10-CM | POA: Diagnosis not present

## 2019-06-29 DIAGNOSIS — R278 Other lack of coordination: Secondary | ICD-10-CM

## 2019-06-29 DIAGNOSIS — M6281 Muscle weakness (generalized): Secondary | ICD-10-CM

## 2019-06-29 NOTE — Therapy (Signed)
[facility] [address] [place], [place], [address] Phone: [phone]   Fax:  [phone]  Occupational Therapy Treatment  Patient Details  Name: Andrew Bruce MRN: [id number] Date of Birth: [birth year] Referring Provider (OT): Delle Reining PA-C   Encounter Date: 06/29/2019   OT End of Session - 06/29/19 1421    Visit Number 9    Number of Visits 17    Date for OT Re-Evaluation 07/10/19    Authorization Type ? MCD pending - pt did NOT have MCD at time of evaluation    OT Start Time 1315    OT Stop Time 1400    OT Time Calculation (min) 45 min    Activity Tolerance Patient tolerated treatment well    Behavior During Therapy WFL for tasks assessed/performed           Past Medical History:  Diagnosis Date  . COVID-19 02/06/2019    Past Surgical History:  Procedure Laterality Date  . DIRECT LARYNGOSCOPY Bilateral 04/06/2019   Procedure: MICRO DIRECT LARYNGOSCOPY WITH PROLARYN INJECTION;  Surgeon: Christia Reading, MD;  Location: [facility] OR;  Service: ENT;  Laterality: Bilateral;  . IR GASTROSTOMY TUBE MOD SED  03/10/2019  . IR GASTROSTOMY TUBE REMOVAL  04/20/2019    There were no vitals filed for this visit.   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    Limitations no driving, no heavy lifting    Patient Stated Goals go back to work    Currently in Pain? Yes    Pain Score 4     Pain Location Toe (Comment which one)    Pain Orientation Right;Left    Pain Descriptors / Indicators Aching    Pain Type Acute pain    Pain Onset More than a month ago    Pain Frequency Intermittent           Pt placing medium sized pegs in pegboard on vertical surface to incorporate RUE ROM/Reaching and endurance with [facility] - pt  w/ min drops but also often off by one spot when placing pegs - pt reports changes in vision. Pt reports diplopia in near vision - when assessed, appears more in central and lower ranges of near vision and pt does have impaired convergence. Pt is not scheduled with neuro-opthamalogist until August. Pt shown convergence ex (via interpreter) and instructed to perform at home but stop if/when image is double.                        OT Short Term Goals - 06/24/19 1324      OT SHORT TERM GOAL #1   Title Independent with HEP for RT hand strength and coordination    Baseline dependent - not yet issued    Time 4    Period Weeks    Status Achieved      OT SHORT TERM GOAL #2   Title Pt to be supervision only for bathing while seated    Baseline dependent    Time 4    Period Weeks    Status Achieved   in standing w/ grab bar     OT SHORT TERM GOAL #3   Title Pt to perform 10 min of activity  with only 1 rest break and no significant change in HR    Baseline Pt gets SOB and requires frequent rest breaks    Time 4    Period Weeks    Status On-going   Pt standing for 10 minutes with O2 sats 94%     OT SHORT TERM GOAL #4   Title Pt to improve grip strength Rt hand to 30 lbs, and Lt hand to 45 lbs    Baseline Rt = 20, Lt = 38 lbs    Time 4    Period Weeks    Status Achieved   36.8-RUE, 45.4-LUE     OT SHORT TERM GOAL #5   Title Improve coordination as evidenced by performing 9 hole peg test in 50 sec or less Rt dominant hand    Baseline Rt = 63.94 sec    Time 4    Period Weeks    Status Achieved   46.25            OT Long Term Goals - 05/10/19 1406      OT LONG TERM GOAL #1   Title Independent with BUE HEP for strength and endurance    Baseline dependent - not yet issued    Time 8    Period Weeks    Status New      OT LONG TERM GOAL #2   Title Pt to return to simple cooking tasks for 15 min or greater at mod I level    Baseline dependent    Time 8    Period  Weeks    Status New      OT LONG TERM GOAL #3   Title Pt to return to light household work (taking out trash, washing dishes, etc) mod I level without rest breaks    Baseline dependent    Time 8    Period Weeks    Status New      OT LONG TERM GOAL #4   Title Pt to improve grip strength RT dominant hand to 40 lbs or greater    Baseline Rt = 20 lbs    Time 8    Period Weeks    Status New      OT LONG TERM GOAL #5   Title Pt to improve coordination as evidenced by performing 9 hole peg test in 40 sec or less Rt hand    Baseline 63.94 sec    Time 8    Period Weeks    Status New                 Plan - 06/29/19 1422    Clinical Impression Statement Pt progressing with RUE function. Pt with diplopia in near vision and more central and lower vision (better in superior quadrants and distance vision)    Occupational performance deficits (Please refer to evaluation for details): ADL's;IADL's;Work;Leisure    Body Structure / Function / Physical Skills ADL;ROM;IADL;Endurance;Body mechanics;Sensation;Mobility;Flexibility;Strength;Coordination;FMC;Pain;UE functional use;Decreased knowledge of use of DME    Rehab Potential Good    OT Frequency 2x / week    OT Duration 8 weeks    OT Treatment/Interventions Self-care/ADL training;Therapeutic exercise;Functional Mobility Training;Aquatic Therapy;Neuromuscular education;Manual Therapy;Energy conservation;Therapeutic activities;Coping strategies training;DME and/or AE instruction;Cognitive remediation/compensation;Visual/perceptual remediation/compensation;Moist Heat;Passive range of motion;Patient/family education    Plan issue diplopia HEP for scanning and convergence, letter cancellation, consider taping glasses for reading (diplopia only w/ near vision)    Consulted and Agree with Plan of Care Patient  Patient will benefit from skilled therapeutic intervention in order to improve the following deficits and impairments:   Body  Structure / Function / Physical Skills: ADL, ROM, IADL, Endurance, Body mechanics, Sensation, Mobility, Flexibility, Strength, Coordination, FMC, Pain, UE functional use, Decreased knowledge of use of DME       Visit Diagnosis: Other lack of coordination  Muscle weakness (generalized)  Right hemiparesis ([facility])    Problem List Patient Active Problem List   Diagnosis Date Noted  . 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 Impaired ambulation 06/11/2019  . Dependent on walker for ambulation 04/30/2019  . Right hemiparesis ([facility]) 04/30/2019  . Tachycardia 04/30/2019  . Acute blood loss anemia   . Uncontrolled type 2 diabetes mellitus with hyperglycemia ([facility])   . Dry gangrene ([facility])   . Leukocytosis   . 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 Status post tracheostomy ([facility])   . Pressure injury of skin 03/02/2019  . On mechanically assisted ventilation ([facility])   . Goals of care, counseling/discussion   . Palliative care encounter   . ARDS (adult respiratory distress syndrome) ([facility])   . Cerebral embolism with cerebral infarction 02/17/2019  . Acute respiratory failure with hypoxia ([facility])   . Pneumothorax, right     Kelli Churn, OTR/L 06/29/2019, 2:30 PM  [facility] [address] [place], [place], [address] Phone: [phone]   Fax:  [phone]  Name: Andrew Bruce MRN: [id number] Date of Birth: 25-Jul-1965

## 2019-06-30 ENCOUNTER — Encounter: Payer: Self-pay | Admitting: Surgery

## 2019-07-01 ENCOUNTER — Ambulatory Visit: Payer: Medicaid Other

## 2019-07-01 ENCOUNTER — Other Ambulatory Visit: Payer: Self-pay

## 2019-07-01 ENCOUNTER — Encounter: Payer: Self-pay | Admitting: Speech Pathology

## 2019-07-01 ENCOUNTER — Ambulatory Visit: Payer: Medicaid Other | Admitting: Speech Pathology

## 2019-07-01 ENCOUNTER — Ambulatory Visit: Payer: Medicaid Other | Admitting: Occupational Therapy

## 2019-07-01 DIAGNOSIS — G8191 Hemiplegia, unspecified affecting right dominant side: Secondary | ICD-10-CM | POA: Diagnosis not present

## 2019-07-01 DIAGNOSIS — I639 Cerebral infarction, unspecified: Secondary | ICD-10-CM

## 2019-07-01 DIAGNOSIS — R498 Other voice and resonance disorders: Secondary | ICD-10-CM

## 2019-07-01 DIAGNOSIS — R2689 Other abnormalities of gait and mobility: Secondary | ICD-10-CM

## 2019-07-01 DIAGNOSIS — R2681 Unsteadiness on feet: Secondary | ICD-10-CM

## 2019-07-01 DIAGNOSIS — J3800 Paralysis of vocal cords and larynx, unspecified: Secondary | ICD-10-CM

## 2019-07-01 NOTE — Therapy (Signed)
[facility] [address] [place], [place], [address] Phone: [phone]   Fax:  [phone]  Physical Therapy Treatment  Patient Details  Name: Andrew Bruce MRN: [id number] Date of Birth: 1965/03/25 Referring Provider (PT): Reesa Chew, [place]   Encounter Date: 07/01/2019   PT End of Session - 07/01/19 1515    Visit Number 7    Number of Visits 9    Date for PT Re-Evaluation 07/05/19    Authorization Type Eval: 05/10/19 (medicaid)    Progress Note Due on Visit 8    PT Start Time 1450    PT Stop Time 1535    PT Time Calculation (min) 45 min    Equipment Utilized During Treatment Gait belt    Activity Tolerance Patient tolerated treatment well    Behavior During Therapy [facility] for tasks assessed/performed           Past Medical History:  Diagnosis Date  . COVID-19 02/06/2019    Past Surgical History:  Procedure Laterality Date  . DIRECT LARYNGOSCOPY Bilateral 04/06/2019   Procedure: MICRO DIRECT LARYNGOSCOPY WITH PROLARYN INJECTION;  Surgeon: Melida Quitter, MD;  Location: [facility];  Service: ENT;  Laterality: Bilateral;  . IR GASTROSTOMY TUBE MOD SED  03/10/2019  . IR GASTROSTOMY TUBE REMOVAL  04/20/2019    There were no vitals filed for this visit.   Subjective Assessment - 07/01/19 1503    Subjective Pt reports he walks 1 hour, 2x/day.    Patient is accompained by: Interpreter    Pertinent History COVID in patient stay, hx of CVA, hx of PE, gangerene of toes    Limitations Standing;Walking;House hold activities    How long can you stand comfortably? 5-10 min    How long can you walk comfortably? inside home only    Patient Stated Goals get stronger              Standing heel raises: 3 x 10 Standing toe raises: 3 x 10 Mini squats: 2 x 10 Side steps: 2 x 10 green band issued Monster walk: fwd and bwd: 2 x 10 Green band Single leg stance: 5 x 20" R and L Tandem walking: fwd and bwd: 10 feet Lateral  step downs: 2 x 10 R and L    Home exercise program:  Standing Heel Raise with Support - 1 x daily - 7 x weekly - 3 sets - 10 reps Standing Toe Raises at Chair - 1 x daily - 7 x weekly - 3 sets - 10 reps Mini Squat with Counter Support - 1 x daily - 7 x weekly - 3 sets - 10 reps Side Stepping with Resistance at Ankles - 1 x daily - 7 x weekly - 3 sets - 10 reps Forward Monster Walks - 1 x daily - 7 x weekly - 3 sets - 10 reps Backward Monster Walks - 1 x daily - 7 x weekly - 3 sets - 10 reps Single Leg Stance - 1 x daily - 7 x weekly - 1 sets - 5 reps - 20 hold Walking Tandem Stance - 1 x daily - 7 x weekly - 3 sets - 10 reps Lateral Step Down - 1 x daily - 7 x weekly - 3 sets - 10 reps                     PT Education - 07/01/19 1504    Education Details PT educated to work on exercises for  30 min to 1 hour. He should only do walking for once a day for 1 hour.    Person(s) Educated Patient;Other (comment)   Interpreter   Methods Explanation    Comprehension Verbalized understanding            PT Short Term Goals - 06/01/19 1019      PT SHORT TERM GOAL #1   Title Patient will be able to ambulate 200 feet with cane or no AD for 200 feet to improve household ambulation    Baseline 2 ww walker (eval); 220 feet without AD (06/01/19)    Time 3    Period Weeks    Status Achieved    Target Date 05/31/19      PT SHORT TERM GOAL #2   Title Pt will be able to perform 5 sit to stands without use of UE for support to improve functional strength    Baseline must use bil hands for support (Eval); 5x sit to stand in 23 seconds    Time 3    Period Weeks    Status Achieved    Target Date 05/31/19      PT SHORT TERM GOAL #3   Title Patient will be able to go up and down curb and ramp with cane of no AD to improve comminty navigation    Baseline Pt able to go up and down ramp and curb: 3x with SBA    Time 3    Period Weeks    Status Achieved    Target Date 05/31/19               PT Long Term Goals - 06/01/19 1029      PT LONG TERM GOAL #1   Title Pt will be able to ambulate with cane or lesser AD for >1500 feet to improve community ambulation.    Baseline must use 2 ww walker, 1025' without AD (06/01/19)    Time 8    Period Weeks    Status Partially Met    Target Date 07/05/19      PT LONG TERM GOAL #2   Title Pt will be able to stand/walk for >1 hour to improve ability to go shopping    Baseline 5-10 min (eval); 30 min (06/01/19)    Time 8    Period Weeks    Status New    Target Date 07/05/19      PT LONG TERM GOAL #3   Title Pt will demo 56/56 on BBS to improve overall balance and reduce fall risk    Baseline 52/56    Time 8    Period Weeks    Status New    Target Date 07/05/19                 Plan - 07/01/19 1529    Clinical Impression Statement Today's session was focused on working on standing/exercise endurance and then updating HEP to improve continued management of strength and endurance.    Personal Factors and Comorbidities Comorbidity 2;Time since onset of injury/illness/exacerbation;Transportation    Comorbidities Hx of CVA, Gangerene of toes    Examination-Activity Limitations Bathing;Hygiene/Grooming;Patent attorney for Others    Examination-Participation Restrictions Community Activity;Driving;Laundry;Yard Work;Shop;Meal Prep    Stability/Clinical Decision Making Evolving/Moderate complexity    Rehab Potential Good    PT Frequency 1x / week    PT Duration 8 weeks    PT Treatment/Interventions ADLs/Self Care Home Management;Gait training;Stair training;Functional mobility training;Therapeutic exercise;Therapeutic activities;Balance training;Patient/family education;Orthotic Fit/Training;Passive  range of motion;Manual techniques;Energy conservation;Visual/perceptual remediation/compensation;Joint Manipulations    PT Next Visit Plan Review partial tandem on floor with head turns- progress it to non compliant  surface.    PT Home Exercise Plan Access Code: [email or web address]: [email or web address]: 06/17/2021Prepared by: Gwenyth Bouillon PatelExercisesStanding Heel Raise with Support - 1 x daily - 7 x weekly - 3 sets - 10 repsStanding Toe Raises at Chair - 1 x daily - 7 x weekly - 3 sets - 10 repsMini Squat with Counter Support - 1 x daily - 7 x weekly - 3 sets - 10 repsSide Stepping with Resistance at Ankles - 1 x daily - 7 x weekly - 3 sets - 10 repsForward Monster Walks - 1 x daily - 7 x weekly - 3 sets - 10 repsBackward Monster Walks - 1 x daily - 7 x weekly - 3 sets - 10 repsSingle Leg Stance - 1 x daily - 7 x weekly - 1 sets - 5 reps - 20 holdWalking Tandem Stance - 1 x daily - 7 x weekly - 3 sets - 10 repsLateral Step Down - 1 x daily - 7 x weekly - 3 sets - 10 reps    Consulted and Agree with Plan of Care Patient;Family member/caregiver    Family Member Consulted Wife           Patient will benefit from skilled therapeutic intervention in order to improve the following deficits and impairments:  Abnormal gait, Decreased balance, Decreased mobility, Decreased endurance, Decreased range of motion, Decreased strength, Difficulty walking, Impaired sensation, Pain  Visit Diagnosis: Unsteadiness on feet  Cerebral infarction, unspecified mechanism (HCC)  Other abnormalities of gait and mobility     Problem List Patient Active Problem List   Diagnosis Date Noted  . 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 Impaired ambulation 06/11/2019  . Dependent on walker for ambulation 04/30/2019  . Right hemiparesis ([place]) 04/30/2019  . Tachycardia 04/30/2019  . Acute blood loss anemia   . Uncontrolled type 2 diabetes mellitus with hyperglycemia ([place])   . Dry gangrene ([place])   . Leukocytosis   . Embolic stroke ([place]) 91/66/0600  . Status post tracheostomy ([place])   . Pressure injury of skin 03/02/2019  . On mechanically assisted ventilation ([place])   . Goals of care, counseling/discussion   . Palliative care  encounter   . ARDS (adult respiratory distress syndrome) ([place])   . Cerebral embolism with cerebral infarction 02/17/2019  . Acute respiratory failure with hypoxia ([place])   . Pneumothorax, right     Kerrie Pleasure 07/01/2019, 3:37 PM  [facility] [address] [place], [place], [address] Phone: [phone]   Fax:  [phone]  Name: Andrew Bruce MRN: [id number] Date of Birth: 10/26/1965

## 2019-07-01 NOTE — Therapy (Signed)
[facility] [address] [place], [place], [address] Phone: [phone]   Fax:  [phone]  Occupational Therapy Treatment  Patient Details  Name: Andrew Bruce MRN: [id number] Date of Birth: [birth year] Referring Provider (OT): Delle Reining PA-C   Encounter Date: 07/01/2019   OT End of Session - 07/01/19 1624    Visit Number 10    Number of Visits 17    Date for OT Re-Evaluation 07/10/19    Authorization Type ? MCD pending - pt did NOT have MCD at time of evaluation    OT Start Time 1535    OT Stop Time 1622    OT Time Calculation (min) 47 min    Activity Tolerance Patient tolerated treatment well    Behavior During Therapy WFL for tasks assessed/performed           Past Medical History:  Diagnosis Date  . COVID-19 02/06/2019    Past Surgical History:  Procedure Laterality Date  . DIRECT LARYNGOSCOPY Bilateral 04/06/2019   Procedure: MICRO DIRECT LARYNGOSCOPY WITH PROLARYN INJECTION;  Surgeon: Christia Reading, MD;  Location: [facility] OR;  Service: ENT;  Laterality: Bilateral;  . IR GASTROSTOMY TUBE MOD SED  03/10/2019  . IR GASTROSTOMY TUBE REMOVAL  04/20/2019    There were no vitals filed for this visit.   Subjective Assessment - 07/01/19 1526    Patient is accompanied by: Interpreter    Pertinent History CVA w/ mild Rt hemiparesis secondary to COVID and ARF Jan 2021. 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    Limitations no driving, no heavy lifting    Patient Stated Goals go back to work    Currently in Pain? Yes   toes - O.T. not addressing          Issued diplopia HEP and thoroughly reviewed with patient with live interpreter. Pt return demo of each x 5 reps. This took majority of session. Pt encouraged to get $1 reading glasses (lowest magnification) for taping/partial occlusion next session to eliminate diplopia with  reading.  Letter cancellation 50M print size, lower case (a-l) with 15 errors. Pt to try same task next session w/ partial occlusion glasses to see if accuracy increases                     OT Education - 07/01/19 1531    Education Details Diplopia HEP    Person(s) Educated Patient    Methods Explanation;Demonstration;Verbal cues   via live interpreter   Comprehension Verbalized understanding;Returned demonstration;Verbal cues required            OT Short Term Goals - 06/24/19 0924      OT SHORT TERM GOAL #1   Title Independent with HEP for RT hand strength and coordination    Baseline dependent - not yet issued    Time 4    Period Weeks    Status Achieved      OT SHORT TERM GOAL #2   Title Pt to be supervision only for bathing while seated    Baseline dependent    Time 4    Period Weeks    Status Achieved   in standing w/ grab bar     OT SHORT TERM GOAL #3   Title Pt to perform 10 min of activity with only 1 rest break and no significant change in HR  Baseline Pt gets SOB and requires frequent rest breaks    Time 4    Period Weeks    Status On-going   Pt standing for 10 minutes with O2 sats 94%     OT SHORT TERM GOAL #4   Title Pt to improve grip strength Rt hand to 30 lbs, and Lt hand to 45 lbs    Baseline Rt = 20, Lt = 38 lbs    Time 4    Period Weeks    Status Achieved   36.8-RUE, 45.4-LUE     OT SHORT TERM GOAL #5   Title Improve coordination as evidenced by performing 9 hole peg test in 50 sec or less Rt dominant hand    Baseline Rt = 63.94 sec    Time 4    Period Weeks    Status Achieved   46.25            OT Long Term Goals - 05/10/19 1406      OT LONG TERM GOAL #1   Title Independent with BUE HEP for strength and endurance    Baseline dependent - not yet issued    Time 8    Period Weeks    Status New      OT LONG TERM GOAL #2   Title Pt to return to simple cooking tasks for 15 min or greater at mod I level    Baseline  dependent    Time 8    Period Weeks    Status New      OT LONG TERM GOAL #3   Title Pt to return to light household work (taking out trash, washing dishes, etc) mod I level without rest breaks    Baseline dependent    Time 8    Period Weeks    Status New      OT LONG TERM GOAL #4   Title Pt to improve grip strength RT dominant hand to 40 lbs or greater    Baseline Rt = 20 lbs    Time 8    Period Weeks    Status New      OT LONG TERM GOAL #5   Title Pt to improve coordination as evidenced by performing 9 hole peg test in 40 sec or less Rt hand    Baseline 63.94 sec    Time 8    Period Weeks    Status New                 Plan - 07/01/19 1625    Clinical Impression Statement Pt progressing with understanding of diplopia and fields where he can control/merge image.    Occupational performance deficits (Please refer to evaluation for details): ADL's;IADL's;Work;Leisure    Body Structure / Function / Physical Skills ADL;ROM;IADL;Endurance;Body mechanics;Sensation;Mobility;Flexibility;Strength;Coordination;FMC;Pain;UE functional use;Decreased knowledge of use of DME    Rehab Potential Good    OT Frequency 2x / week    OT Duration 8 weeks    OT Treatment/Interventions Self-care/ADL training;Therapeutic exercise;Functional Mobility Training;Aquatic Therapy;Neuromuscular education;Manual Therapy;Energy conservation;Therapeutic activities;Coping strategies training;DME and/or AE instruction;Cognitive remediation/compensation;Visual/perceptual remediation/compensation;Moist Heat;Passive range of motion;Patient/family education    Plan Pt to bring in reader glasses (lowest magnification) for therapist to tape for parital occlusion to resolve diplopia w/ reading task - compare letter cancellation task to today w/ occlusion    Consulted and Agree with Plan of Care Patient           Patient will benefit from skilled therapeutic intervention in order to  improve the following deficits  and impairments:   Body Structure / Function / Physical Skills: ADL, ROM, IADL, Endurance, Body mechanics, Sensation, Mobility, Flexibility, Strength, Coordination, FMC, Pain, UE functional use, Decreased knowledge of use of DME       Visit Diagnosis: Right hemiparesis [facility])    Problem List Patient Active Problem List   Diagnosis Date Noted  . 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 Impaired ambulation 06/11/2019  . Dependent on walker for ambulation 04/30/2019  . Right hemiparesis ([place]) 04/30/2019  . Tachycardia 04/30/2019  . Acute blood loss anemia   . Uncontrolled type 2 diabetes mellitus with hyperglycemia ([place])   . Dry gangrene ([place])   . Leukocytosis   . Embolic stroke ([place]) 03/55/9741  . Status post tracheostomy ([place])   . Pressure injury of skin 03/02/2019  . On mechanically assisted ventilation ([place])   . Goals of care, counseling/discussion   . Palliative care encounter   . ARDS (adult respiratory distress syndrome) ([place])   . Cerebral embolism with cerebral infarction 02/17/2019  . Acute respiratory failure with hypoxia ([place])   . Pneumothorax, right     Carey Bullocks, OTR/L 07/01/2019, 4:27 PM  [facility] [address] [place], [place], [address] Phone: [phone]   Fax:  [phone]  Name: Andrew Bruce MRN: [id number] Date of Birth: 1965/04/29

## 2019-07-01 NOTE — Therapy (Signed)
[facility] [address] [place], [place], [address] Phone: [phone]   Fax:  [phone]  Speech Language Pathology Treatment  Patient Details  Name: Andrew Bruce MRN: [id number] Date of Birth: 1965-03-07 Referring Provider (SLP): Dionisio David, NP   Encounter Date: 07/01/2019   End of Session - 07/01/19 1729    Visit Number 6    Number of Visits 17    Date for SLP Re-Evaluation 08/10/19    SLP Start Time 1620    SLP Stop Time  1705    SLP Time Calculation (min) 45 min    Activity Tolerance Patient tolerated treatment well           Past Medical History:  Diagnosis Date  . COVID-19 02/06/2019    Past Surgical History:  Procedure Laterality Date  . DIRECT LARYNGOSCOPY Bilateral 04/06/2019   Procedure: MICRO DIRECT LARYNGOSCOPY WITH PROLARYN INJECTION;  Surgeon: Melida Quitter, MD;  Location: [facility];  Service: ENT;  Laterality: Bilateral;  . IR GASTROSTOMY TUBE MOD SED  03/10/2019  . IR GASTROSTOMY TUBE REMOVAL  04/20/2019    There were no vitals filed for this visit.   Subjective Assessment - 07/01/19 1623    Subjective Pt did not bring EMST/ IMST devices today                 ADULT SLP TREATMENT - 07/01/19 1721      General Information   Behavior/Cognition Alert;Cooperative;Pleasant mood      Treatment Provided   Treatment provided Cognitive-Linquistic      Cognitive-Linquistic Treatment   Treatment focused on Voice;Patient/family/caregiver education    Skilled Treatment Patient did not bring EMST IMST devices so SLP reviewed procedure/ frequency with pt. He stated he is performing 30 reps with breaks after each 10, denies hyperventilating. Patient reports family members commenting on his voice being stronger. He demo'd VF adduction with occasional min-mod A for procedure. When SLP had pt attempt to read out loud (strong voice), patient had difficulty; while there is diplopia per OT, SLP  suspects possibly language-related deficits as well. Very difficult to assess given language barrier, visual deficits. He reports having trouble reading text messages, so SLP attempted to turn on "Speak selection" accessibility feature on pt's phone, however unable to locate this in settings, which were in [place]. Will provide handout in Spanish for family to assist pt with this in subsequent session. SLP discussed plan with pt and that only one more visit of ST is scheduled; he did not wish to add additional visits at this time.       Assessment / Recommendations / Plan   Plan Continue with current plan of care      Progression Toward Goals   Progression toward goals Progressing toward goals                SLP Long Term Goals - 07/01/19 1731      SLP LONG TERM GOAL #1   Title pt will report following 3 aspects of vocal conservation program between 4 sessions    Baseline no aspects    Time 4    Period Weeks   or visits, for all LTGs   Status On-going      SLP LONG TERM GOAL #2   Title Pt will complete exercises for vocal fold adduction with rare min A x3 visits.    Baseline not provided yet; 06-22-19    Time 4    Period Weeks  Status Revised      SLP LONG TERM GOAL #3   Title pt will use abdominal breathing in 5 minutes simple conversation in 3 sessions    Baseline chest breathing    Time 4    Period Weeks    Status On-going      SLP LONG TERM GOAL #4   Title pt will use WNL vocal effort without strain/pushing for vocalization with rare min A during 3 ST sessions    Baseline strainedpushed until SPL told pt not to do so    Time 4    Period Weeks    Status On-going      SLP LONG TERM GOAL #5   Title pt will rate his voice at least 7/10 (where 10=WNL voice)    Baseline 5/10    Time 5    Period Weeks    Status On-going      SLP LONG TERM GOAL #6   Title Patient will tell SLP 3 signs of aspiration PNA.    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 Today SLP reviewed vocal fold adduction exercises; question mild expressive language/reading impairments which pt mentioned in session today. Pt denies ongoing coughing/choking with POs at this time. SLP believes pt will benefit from at least x1/week ST for exercises for vocal fold adduction, vocal hygiene and education/training on abdominal breathing to maximize breath support for speech.    Speech Therapy Frequency 1x /week    Duration --   8 weeks   Treatment/Interventions Functional tasks;Internal/external aids;Patient/family education;Compensatory strategies;SLP instruction and feedback;Other (comment);Aspiration precaution training;Pharyngeal strengthening exercises;Diet toleration management by SLP;Trials of upgraded texture/liquids   vocal hygiene program, vocal function/adduction exercises   Potential to Achieve Goals Good   tentative potential- without ENT consult difficult to ascertain   Potential Considerations Financial resources    SLP Home Exercise Plan provided today    Consulted and Agree with Plan of Care Patient           Patient will benefit from skilled therapeutic intervention in order to improve the following deficits and impairments:   Other voice and resonance disorders  Vocal cord paralysis    Problem List Patient Active Problem List   Diagnosis Date Noted  . 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 Impaired ambulation 06/11/2019  . Dependent on walker for ambulation 04/30/2019  . Right hemiparesis (HCC) 04/30/2019  . Tachycardia 04/30/2019  . Acute blood loss anemia   . Uncontrolled type 2  diabetes mellitus with hyperglycemia (HCC)   . Dry gangrene (HCC)   . Leukocytosis   . 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 Status post tracheostomy (HCC)   . Pressure injury of skin 03/02/2019  . On mechanically assisted ventilation (HCC)   . Goals of care, counseling/discussion   . Palliative care encounter   . ARDS (adult respiratory distress syndrome) (HCC)   . Cerebral embolism with cerebral infarction 02/17/2019  . Acute respiratory failure with hypoxia (HCC)   .  Pneumothorax, right    Rondel Baton, MS, CCC-SLP Speech-Language Pathologist   Arlana Lindau 07/01/2019, 5:32 PM  [facility] [address] [place], [place], [address] Phone: [phone]   Fax:  [phone]   Name: Giankarlo Leamer MRN: [id number] Date of Birth: 04/16/1965

## 2019-07-01 NOTE — Patient Instructions (Signed)
  Diplopia HEP:  Perform at least 3 times per day. Stop if your eye becomes fatigued or hurts and try again later.  1. Hold a small object/card in front of you.  Hold it in the middle at arm's length away.    2. Cover your LEFT eye and look at the object with your RIGHT eye.  3. Slowly move the object side to side in front of you while continuing to watch it with your RIGHT eye.  4.  Remember to keep your head still and only move your eye.  5.  Repeat 5-10 times.  6.  Then, move object up and down while watching it 5-10 times.  7. Cover your RIGHT eye and look at the object with your LEFT eye while you repeat #1-6 above.  8.  Now, uncover both eyes and try to focus on the object while holding it in the middle.  Try to make it 1 image.   9.  If you can, try to hold it for 10-30 sec increasing as able.    10.  Once you can make the image 1 for at least 30 sec in the middle, repeat #1-6 above with both eyes moving slowly and only in the range that you can keep the image 1.  11. Hold image at arm's length and slowly bring image closer to face, stop if image becomes double. Slowly move back out. Repeat 5-10 times

## 2019-07-05 ENCOUNTER — Ambulatory Visit: Payer: Self-pay

## 2019-07-06 ENCOUNTER — Ambulatory Visit: Payer: Medicaid Other

## 2019-07-06 ENCOUNTER — Other Ambulatory Visit: Payer: Self-pay

## 2019-07-06 ENCOUNTER — Encounter: Payer: Self-pay | Admitting: Speech Pathology

## 2019-07-06 ENCOUNTER — Ambulatory Visit: Payer: Medicaid Other | Admitting: Occupational Therapy

## 2019-07-06 ENCOUNTER — Encounter: Payer: Self-pay | Admitting: Occupational Therapy

## 2019-07-06 DIAGNOSIS — I639 Cerebral infarction, unspecified: Secondary | ICD-10-CM

## 2019-07-06 DIAGNOSIS — R278 Other lack of coordination: Secondary | ICD-10-CM

## 2019-07-06 DIAGNOSIS — G8191 Hemiplegia, unspecified affecting right dominant side: Secondary | ICD-10-CM

## 2019-07-06 DIAGNOSIS — M6281 Muscle weakness (generalized): Secondary | ICD-10-CM

## 2019-07-06 DIAGNOSIS — R2689 Other abnormalities of gait and mobility: Secondary | ICD-10-CM

## 2019-07-06 DIAGNOSIS — R2681 Unsteadiness on feet: Secondary | ICD-10-CM

## 2019-07-06 NOTE — Therapy (Signed)
[facility] [address] [place], [place], [address] Phone: [phone]   Fax:  [phone]  Physical Therapy Treatment  Patient Details  Name: Andrew Bruce MRN: [id number] Date of Birth: 03-17-1965 Referring Provider (PT): Delle Reining, [place]   Encounter Date: 07/06/2019   PT End of Session - 07/06/19 1811    Visit Number 8    Number of Visits 9    Date for PT Re-Evaluation 07/05/19    Authorization Type Eval: 05/10/19 (medicaid)    Progress Note Due on Visit 8    PT Start Time 1530    PT Stop Time 1615    PT Time Calculation (min) 45 min    Equipment Utilized During Treatment Gait belt    Activity Tolerance Patient tolerated treatment well    Behavior During Therapy [facility] for tasks assessed/performed           Past Medical History:  Diagnosis Date  . COVID-19 02/06/2019    Past Surgical History:  Procedure Laterality Date  . DIRECT LARYNGOSCOPY Bilateral 04/06/2019   Procedure: MICRO DIRECT LARYNGOSCOPY WITH PROLARYN INJECTION;  Surgeon: Christia Reading, MD;  Location: [facility] OR;  Service: ENT;  Laterality: Bilateral;  . IR GASTROSTOMY TUBE MOD SED  03/10/2019  . IR GASTROSTOMY TUBE REMOVAL  04/20/2019    There were no vitals filed for this visit.   Subjective Assessment - 07/06/19 1807    Subjective Pt reports I saw the foot surgeon and he thinks toes are healing. I am following up with themm in 2 months. Currently no surgery needed on toes.    Patient is accompained by: Interpreter    Pertinent History COVID in patient stay, hx of CVA, hx of PE, gangerene of toes    Limitations Standing;Walking;House hold activities    How long can you stand comfortably? 5-10 min    How long can you walk comfortably? able to walk 1 hour    Patient Stated Goals get stronger    Currently in Pain? No/denies                  Treatment Resisted wlaking with black sport cord and stepping over 3 x 8" hurdles - walking  fwd and bwd: leading with L LE over hurdles: 2 laps, leading with R LE over hurdles: 2 laps - walking laterally: 2 laps each way (occaisonal min A required for balance) Bil leg press: 90lbs 2 x 10 Uni leg prss: 50lbs 2 x 10 R and L SLS 3 x 30" R and L Tandem walking: fwd and bwd: 10 feet x 2 Stairs training: going up and down without rail, CGA, reciprocating pattern 5 x 4 steps                  PT Education - 07/06/19 1810    Education Details Pt educated to always look down when goin up and down stairs to make sure he doesn't hit his toes on steps. Pt educated to never go up and down stairs at home or in community without holding on to the rail    Person(s) Educated Patient    Methods Explanation    Comprehension Verbalized understanding            PT Short Term Goals - 06/01/19 1019      PT SHORT TERM GOAL #1   Title Patient will be able to ambulate 200 feet with cane or no AD for 200 feet to improve household ambulation  Baseline 2 ww walker (eval); 220 feet without AD (06/01/19)    Time 3    Period Weeks    Status Achieved    Target Date 05/31/19      PT SHORT TERM GOAL #2   Title Pt will be able to perform 5 sit to stands without use of UE for support to improve functional strength    Baseline must use bil hands for support (Eval); 5x sit to stand in 23 seconds    Time 3    Period Weeks    Status Achieved    Target Date 05/31/19      PT SHORT TERM GOAL #3   Title Patient will be able to go up and down curb and ramp with cane of no AD to improve comminty navigation    Baseline Pt able to go up and down ramp and curb: 3x with SBA    Time 3    Period Weeks    Status Achieved    Target Date 05/31/19             PT Long Term Goals - 07/06/19 1808      PT LONG TERM GOAL #1   Title Pt will be able to ambulate with cane or lesser AD for >1500 feet to improve community ambulation.    Baseline must use 2 ww walker, 1025' without AD (06/01/19)    Time 8     Period Weeks    Status Achieved      PT LONG TERM GOAL #2   Title Pt will be able to stand/walk for >1 hour to improve ability to go shopping    Baseline 5-10 min (eval); 30 min (06/01/19)    Time 8    Period Weeks    Status Achieved      PT LONG TERM GOAL #3   Title Pt will demo 56/56 on BBS to improve overall balance and reduce fall risk    Baseline 52/56    Time 8    Period Weeks    Status New                 Plan - 07/06/19 1809    Clinical Impression Statement patient demonstrates demonstrated fatigue with strengthening exercises He needed min A with resisted walking and stepping over hurdles (especially with lateral walks) He has reached all of the STG and mosf of the LTG.    Personal Factors and Comorbidities Comorbidity 2;Time since onset of injury/illness/exacerbation;Transportation    Comorbidities Hx of CVA, Gangerene of toes    Examination-Activity Limitations Bathing;Hygiene/Grooming;Geophysical data processor for Others    Examination-Participation Restrictions Community Activity;Driving;Laundry;Yard Work;Shop;Meal Prep    Stability/Clinical Decision Making Evolving/Moderate complexity    Rehab Potential Good    PT Frequency 1x / week    PT Duration 8 weeks    PT Treatment/Interventions ADLs/Self Care Home Management;Gait training;Stair training;Functional mobility training;Therapeutic exercise;Therapeutic activities;Balance training;Patient/family education;Orthotic Fit/Training;Passive range of motion;Manual techniques;Energy conservation;Visual/perceptual remediation/compensation;Joint Manipulations    PT Next Visit Plan BBS, discuss discharge planning    PT Home Exercise Plan Access Code: [email or web address]: [email or web address]: 06/17/2021Prepared by: Theone Murdoch PatelExercisesStanding Heel Raise with Support - 1 x daily - 7 x weekly - 3 sets - 10 repsStanding Toe Raises at Chair - 1 x daily - 7 x weekly - 3 sets - 10 repsMini Squat with Counter Support  - 1 x daily - 7 x weekly - 3 sets - 10 repsSide Stepping with Resistance at Ankles - 1 x daily -  7 x weekly - 3 sets - 10 repsForward Monster Walks - 1 x daily - 7 x weekly - 3 sets - 10 repsBackward Monster Walks - 1 x daily - 7 x weekly - 3 sets - 10 repsSingle Leg Stance - 1 x daily - 7 x weekly - 1 sets - 5 reps - 20 holdWalking Tandem Stance - 1 x daily - 7 x weekly - 3 sets - 10 repsLateral Step Down - 1 x daily - 7 x weekly - 3 sets - 10 reps    Consulted and Agree with Plan of Care Patient;Family member/caregiver    Family Member Consulted Wife           Patient will benefit from skilled therapeutic intervention in order to improve the following deficits and impairments:  Abnormal gait, Decreased balance, Decreased mobility, Decreased endurance, Decreased range of motion, Decreased strength, Difficulty walking, Impaired sensation, Pain  Visit Diagnosis: Right hemiparesis (HCC)  Unsteadiness on feet  Other abnormalities of gait and mobility  Cerebral infarction, unspecified mechanism (HCC)  Muscle weakness (generalized)     Problem List Patient Active Problem List   Diagnosis Date Noted  . 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 Impaired ambulation 06/11/2019  . Dependent on walker for ambulation 04/30/2019  . Right hemiparesis ([place]) 04/30/2019  . Tachycardia 04/30/2019  . Acute blood loss anemia   . Uncontrolled type 2 diabetes mellitus with hyperglycemia ([place])   . Dry gangrene ([place])   . Leukocytosis   . Embolic stroke ([place]) 29/79/8921  . Status post tracheostomy ([place])   . Pressure injury of skin 03/02/2019  . On mechanically assisted ventilation ([place])   . Goals of care, counseling/discussion   . Palliative care encounter   . ARDS (adult respiratory distress syndrome) ([place])   . Cerebral embolism with cerebral infarction 02/17/2019  . Acute respiratory failure with hypoxia ([place])   . Pneumothorax, right     Kerrie Pleasure 07/06/2019, 6:12 PM  [facility] [address] [place], [place], [address] Phone: [phone]   Fax:  [phone]  Name: Andrew Bruce MRN: [id number] Date of Birth: Mar 17, 1965

## 2019-07-06 NOTE — Therapy (Signed)
[facility] [address] [place], [place], [address] Phone: [phone]   Fax:  [phone]  Occupational Therapy Treatment  Patient Details  Name: Andrew Bruce MRN: [id number] Date of Birth: 11-03-1965 Referring Provider (OT): Delle Reining PA-C   Encounter Date: 07/06/2019   OT End of Session - 07/06/19 1505    Visit Number 11    Number of Visits 17    Date for OT Re-Evaluation 07/10/19    Authorization Type ? MCD pending - pt did NOT have MCD at time of evaluation    OT Start Time 1452    OT Stop Time 1530    OT Time Calculation (min) 38 min    Activity Tolerance Patient tolerated treatment well    Behavior During Therapy WFL for tasks assessed/performed           Past Medical History:  Diagnosis Date  . COVID-19 02/06/2019    Past Surgical History:  Procedure Laterality Date  . DIRECT LARYNGOSCOPY Bilateral 04/06/2019   Procedure: MICRO DIRECT LARYNGOSCOPY WITH PROLARYN INJECTION;  Surgeon: Christia Reading, MD;  Location: [facility] OR;  Service: ENT;  Laterality: Bilateral;  . IR GASTROSTOMY TUBE MOD SED  03/10/2019  . IR GASTROSTOMY TUBE REMOVAL  04/20/2019    There were no vitals filed for this visit.   Subjective Assessment - 07/06/19 1504    Patient is accompanied by: Interpreter    Pertinent History CVA w/ mild Rt hemiparesis secondary to COVID and ARF Jan 2021. 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    Limitations no driving, no heavy lifting    Patient Stated Goals go back to work    Currently in Pain? Yes    Pain Score 5     Pain Location Shoulder    Pain Orientation Right    Pain Descriptors / Indicators Aching    Pain Type Acute pain    Pain Onset 1 to 4 weeks ago    Pain Frequency Intermittent    Aggravating Factors  malpositioning    Pain Relieving Factors repositioning                 Treatment:  Number cancellation, pt made only 1 error in 1/2 page while wearing glasses with partial occlusion. Pt was instructed via interpreter to wear his partial occlusion glasses only when reading not when up walking. Pt verbalized understanding. Supine closed chain chest press, shoulder flexion and seated closed chain shoulder flexion, min v.c for shoulder/ scapular positioning, then shoulder extension. Arm bike x 5 mins level 1 for conditioning. Functional mid-high range reach with RUE, to place graded clothespins on vertical antennae, no reports of pain                 OT Short Term Goals - 06/24/19 0924      OT SHORT TERM GOAL #1   Title Independent with HEP for RT hand strength and coordination    Baseline dependent - not yet issued    Time 4    Period Weeks    Status Achieved      OT SHORT TERM GOAL #2   Title Pt to be supervision only for bathing while seated    Baseline dependent    Time 4    Period Weeks    Status Achieved   in standing w/ grab bar     OT  SHORT TERM GOAL #3   Title Pt to perform 10 min of activity with only 1 rest break and no significant change in HR    Baseline Pt gets SOB and requires frequent rest breaks    Time 4    Period Weeks    Status On-going   Pt standing for 10 minutes with O2 sats 94%     OT SHORT TERM GOAL #4   Title Pt to improve grip strength Rt hand to 30 lbs, and Lt hand to 45 lbs    Baseline Rt = 20, Lt = 38 lbs    Time 4    Period Weeks    Status Achieved   36.8-RUE, 45.4-LUE     OT SHORT TERM GOAL #5   Title Improve coordination as evidenced by performing 9 hole peg test in 50 sec or less Rt dominant hand    Baseline Rt = 63.94 sec    Time 4    Period Weeks    Status Achieved   46.25            OT Long Term Goals - 05/10/19 1406      OT LONG TERM GOAL #1   Title Independent with BUE HEP for strength and endurance    Baseline dependent - not yet issued    Time 8    Period Weeks    Status New      OT LONG  TERM GOAL #2   Title Pt to return to simple cooking tasks for 15 min or greater at mod I level    Baseline dependent    Time 8    Period Weeks    Status New      OT LONG TERM GOAL #3   Title Pt to return to light household work (taking out trash, washing dishes, etc) mod I level without rest breaks    Baseline dependent    Time 8    Period Weeks    Status New      OT LONG TERM GOAL #4   Title Pt to improve grip strength RT dominant hand to 40 lbs or greater    Baseline Rt = 20 lbs    Time 8    Period Weeks    Status New      OT LONG TERM GOAL #5   Title Pt to improve coordination as evidenced by performing 9 hole peg test in 40 sec or less Rt hand    Baseline 63.94 sec    Time 8    Period Weeks    Status New                 Plan - 07/06/19 1948    Clinical Impression Statement Pt brought in his reader glasses, and therapist taped the glasses for partial occlusion . Pt reports no diplopia when wearing taped glasses.    Occupational performance deficits (Please refer to evaluation for details): ADL's;IADL's;Work;Leisure    Body Structure / Function / Physical Skills ADL;ROM;IADL;Endurance;Body mechanics;Sensation;Mobility;Flexibility;Strength;Coordination;FMC;Pain;UE functional use;Decreased knowledge of use of DME    Rehab Potential Good    OT Frequency 2x / week    OT Duration 8 weeks    OT Treatment/Interventions Self-care/ADL training;Therapeutic exercise;Functional Mobility Training;Aquatic Therapy;Neuromuscular education;Manual Therapy;Energy conservation;Therapeutic activities;Coping strategies training;DME and/or AE instruction;Cognitive remediation/compensation;Visual/perceptual remediation/compensation;Moist Heat;Passive range of motion;Patient/family education    Plan monitor/ address shoulder pain, visual perceptual skills with taped reading glasses.    Consulted and Agree with Plan of Care Patient  Patient will benefit from skilled therapeutic  intervention in order to improve the following deficits and impairments:   Body Structure / Function / Physical Skills: ADL, ROM, IADL, Endurance, Body mechanics, Sensation, Mobility, Flexibility, Strength, Coordination, FMC, Pain, UE functional use, Decreased knowledge of use of DME       Visit Diagnosis: Right hemiparesis (HCC)  Other lack of coordination  Muscle weakness (generalized)    Problem List Patient Active Problem List   Diagnosis Date Noted  . 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 Impaired ambulation 06/11/2019  . Dependent on walker for ambulation 04/30/2019  . Right hemiparesis ([place]) 04/30/2019  . Tachycardia 04/30/2019  . Acute blood loss anemia   . Uncontrolled type 2 diabetes mellitus with hyperglycemia ([place])   . Dry gangrene ([place])   . Leukocytosis   . Embolic stroke ([place]) 33/54/5625  . Status post tracheostomy ([place])   . Pressure injury of skin 03/02/2019  . On mechanically assisted ventilation ([place])   . Goals of care, counseling/discussion   . Palliative care encounter   . ARDS (adult respiratory distress syndrome) ([place])   . Cerebral embolism with cerebral infarction 02/17/2019  . Acute respiratory failure with hypoxia ([place])   . Pneumothorax, right     Andrew Bruce 07/06/2019, 7:55 PM  [facility] [address] [place], [place], [address] Phone: [phone]   Fax:  [phone]  Name: Andrew Bruce MRN: [id number] Date of Birth: December 03, 1965

## 2019-07-08 ENCOUNTER — Ambulatory Visit: Payer: Self-pay

## 2019-07-08 ENCOUNTER — Ambulatory Visit: Payer: Medicaid Other | Admitting: Occupational Therapy

## 2019-07-08 ENCOUNTER — Encounter: Payer: Self-pay | Admitting: Speech Pathology

## 2019-07-08 ENCOUNTER — Other Ambulatory Visit: Payer: Self-pay

## 2019-07-08 DIAGNOSIS — R278 Other lack of coordination: Secondary | ICD-10-CM

## 2019-07-08 DIAGNOSIS — G8191 Hemiplegia, unspecified affecting right dominant side: Secondary | ICD-10-CM | POA: Diagnosis not present

## 2019-07-08 DIAGNOSIS — M6281 Muscle weakness (generalized): Secondary | ICD-10-CM

## 2019-07-08 NOTE — Patient Instructions (Signed)
Cranial Flexion: Overhead Arm Extension - Supine ([place])    Lie with knees bent, arms beyond head, holding paper towel roll. Pull ball up to above face. Repeat _10___ times per set. Do __2__ sets per session.   Scapular Retraction (Prone)    Lie with arms at sides. Pinch shoulder blades together and raise arms a few inches from floor. Repeat __10__ times per set.  Do _2___ sessions per day.  Extension - Prone ([place])    Lie with right arm hanging off side of bed. Lift hand back and up. Repeat __10__ times per set.  Do _2___ sessions per day

## 2019-07-08 NOTE — Therapy (Signed)
[facility] [address] [place], [place], [address] Phone: [phone]   Fax:  [phone]  Occupational Therapy Treatment  Patient Details  Name: Andrew Bruce MRN: [id number] Date of Birth: 1965/08/23 Referring Provider (OT): Reesa Chew PA-C   Encounter Date: 07/08/2019   OT End of Session - 07/08/19 1326    Visit Number 12    Number of Visits 17    Date for OT Re-Evaluation 07/10/19    Authorization Type ? MCD pending - pt did NOT have MCD at time of evaluation    Authorization Time Period week 7/8    OT Start Time 1230    OT Stop Time 1315    OT Time Calculation (min) 45 min    Activity Tolerance Patient tolerated treatment well    Behavior During Therapy WFL for tasks assessed/performed           Past Medical History:  Diagnosis Date  . COVID-19 02/06/2019    Past Surgical History:  Procedure Laterality Date  . DIRECT LARYNGOSCOPY Bilateral 04/06/2019   Procedure: MICRO DIRECT LARYNGOSCOPY WITH PROLARYN INJECTION;  Surgeon: Melida Quitter, MD;  Location: [facility];  Service: ENT;  Laterality: Bilateral;  . IR GASTROSTOMY TUBE MOD SED  03/10/2019  . IR GASTROSTOMY TUBE REMOVAL  04/20/2019    There were no vitals filed for this visit.   Subjective Assessment - 07/08/19 1235    Subjective  Pt has been reporting Rt shoulder pain recently (addressed today). Pt also reports coldness and numbness Rt hand at thumb and index finger and Lt hand at small finger (therapist felt Rt side d/t stroke, but advised to discuss w/ MD - possibly from covid or IV in LUE while hospitalized for Lt hand)    Patient is accompanied by: Interpreter    Pertinent History CVA w/ mild Rt hemiparesis secondary to COVID and ARF Jan 2021. Pt intubated 02/07/19 and trachecd 9/38/18 - ICU complicated by PE and multiple CVA'S, gangrene of toes Lt 1-4 and Rt 1, 2, 3, 5.    Limitations no driving, no heavy lifting    Patient Stated Goals go back to  work    Currently in Pain? Yes    Pain Score 5     Pain Location Shoulder    Pain Orientation Right    Pain Descriptors / Indicators Aching    Pain Type Acute pain    Pain Onset 1 to 4 weeks ago    Pain Frequency Intermittent    Aggravating Factors  malpositioning    Pain Relieving Factors repositioning           Supine: bilateral shoulder flexion holding thick foam w/ cues and min facilitation for positioning and to reduce/prevent pain Rt shoulder. Discussed proper body mechanics and scapulohumeral rhythm w/ reaching and therapist provided mod mobilization and tactile cues to Rt scapula with open chain mid to high level reaching w/ distal support provided as well - as pt is noted to have elevated scapula. Provided downward depression and upward rotation with reaching.  Prone: bilateral scapula retraction/depression w/ initial cueing both verbally (via interpreter) and tactile. Followed by Rt shoulder extension (over edge of mat) w/ min facilitation. Provided HEP to include all of these ex's to promote proper reaching and stabilize scapula/posterior sh girdle. Pain went from 5/10 down to 1/10 t/o session. Briefly discussed at end of session gradually decreasing the amount of partial occlusion in reading glasses and may remove minimal amount of tape next week.  **  Noted after patient left that he is out of appointments after next week.                      OT Education - 07/08/19 1310    Education Details shoulder/scapula HEP    Person(s) Educated Patient    Methods Explanation;Demonstration;Verbal cues;Handout    Comprehension Verbalized understanding;Returned demonstration;Verbal cues required            OT Short Term Goals - 06/24/19 0924      OT SHORT TERM GOAL #1   Title Independent with HEP for RT hand strength and coordination    Baseline dependent - not yet issued    Time 4    Period Weeks    Status Achieved      OT SHORT TERM GOAL #2   Title Pt to be  supervision only for bathing while seated    Baseline dependent    Time 4    Period Weeks    Status Achieved   in standing w/ grab bar     OT SHORT TERM GOAL #3   Title Pt to perform 10 min of activity with only 1 rest break and no significant change in HR    Baseline Pt gets SOB and requires frequent rest breaks    Time 4    Period Weeks    Status On-going   Pt standing for 10 minutes with O2 sats 94%     OT SHORT TERM GOAL #4   Title Pt to improve grip strength Rt hand to 30 lbs, and Lt hand to 45 lbs    Baseline Rt = 20, Lt = 38 lbs    Time 4    Period Weeks    Status Achieved   36.8-RUE, 45.4-LUE     OT SHORT TERM GOAL #5   Title Improve coordination as evidenced by performing 9 hole peg test in 50 sec or less Rt dominant hand    Baseline Rt = 63.94 sec    Time 4    Period Weeks    Status Achieved   46.25            OT Long Term Goals - 05/10/19 1406      OT LONG TERM GOAL #1   Title Independent with BUE HEP for strength and endurance    Baseline dependent - not yet issued    Time 8    Period Weeks    Status New      OT LONG TERM GOAL #2   Title Pt to return to simple cooking tasks for 15 min or greater at mod I level    Baseline dependent    Time 8    Period Weeks    Status New      OT LONG TERM GOAL #3   Title Pt to return to light household work (taking out trash, washing dishes, etc) mod I level without rest breaks    Baseline dependent    Time 8    Period Weeks    Status New      OT LONG TERM GOAL #4   Title Pt to improve grip strength RT dominant hand to 40 lbs or greater    Baseline Rt = 20 lbs    Time 8    Period Weeks    Status New      OT LONG TERM GOAL #5   Title Pt to improve coordination as evidenced by performing 9 hole peg  test in 40 sec or less Rt hand    Baseline 63.94 sec    Time 8    Period Weeks    Status New                 Plan - 07/08/19 1327    Clinical Impression Statement Pain Rt shoulder 5/10 at beginning of  session however went down to 1/10 with proper positioning and facilitation during movement.    Occupational performance deficits (Please refer to evaluation for details): ADL's;IADL's;Work;Leisure    Body Structure / Function / Physical Skills ADL;ROM;IADL;Endurance;Body mechanics;Sensation;Mobility;Flexibility;Strength;Coordination;FMC;Pain;UE functional use;Decreased knowledge of use of DME    Rehab Potential Good    OT Frequency 2x / week    OT Duration 8 weeks    OT Treatment/Interventions Self-care/ADL training;Therapeutic exercise;Functional Mobility Training;Aquatic Therapy;Neuromuscular education;Manual Therapy;Energy conservation;Therapeutic activities;Coping strategies training;DME and/or AE instruction;Cognitive remediation/compensation;Visual/perceptual remediation/compensation;Moist Heat;Passive range of motion;Patient/family education    Plan continue NMR RUE/Shoulder girdle, work on coordination, grip strength, visual/perceptual skills w/ glasses, begin checking goals (will need renewal after next week and more appointments scheduled - discuss frequency as pt is self pay?)    Consulted and Agree with Plan of Care Patient           Patient will benefit from skilled therapeutic intervention in order to improve the following deficits and impairments:   Body Structure / Function / Physical Skills: ADL, ROM, IADL, Endurance, Body mechanics, Sensation, Mobility, Flexibility, Strength, Coordination, FMC, Pain, UE functional use, Decreased knowledge of use of DME       Visit Diagnosis: Right hemiparesis (HCC)  Other lack of coordination  Muscle weakness (generalized)    Problem List Patient Active Problem List   Diagnosis Date Noted  . 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 Impaired ambulation 06/11/2019  . Dependent on walker for ambulation 04/30/2019  . Right hemiparesis (HCC) 04/30/2019  . Tachycardia 04/30/2019  . Acute blood loss anemia   . Uncontrolled type 2 diabetes  mellitus with hyperglycemia (HCC)   . Dry gangrene (HCC)   . Leukocytosis   . 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 Status post tracheostomy (HCC)   . Pressure injury of skin 03/02/2019  . On mechanically assisted ventilation (HCC)   . Goals of care, counseling/discussion   . Palliative care encounter   . ARDS (adult respiratory distress syndrome) (HCC)   . Cerebral embolism with cerebral infarction 02/17/2019  . Acute respiratory failure with hypoxia (HCC)   . Pneumothorax, right     Kelli Churn, OTR/L 07/08/2019, 1:33 PM  [facility] [address] [place], [place], [address] Phone: [phone]   Fax:  [phone]  Name: Ulas Zuercher MRN: [id number] Date of Birth: Jan 14, 1966

## 2019-07-13 ENCOUNTER — Other Ambulatory Visit: Payer: Self-pay

## 2019-07-13 ENCOUNTER — Ambulatory Visit: Payer: Self-pay

## 2019-07-13 ENCOUNTER — Encounter: Payer: Self-pay | Admitting: Occupational Therapy

## 2019-07-13 ENCOUNTER — Ambulatory Visit: Payer: Medicaid Other | Admitting: Occupational Therapy

## 2019-07-13 DIAGNOSIS — G8191 Hemiplegia, unspecified affecting right dominant side: Secondary | ICD-10-CM | POA: Diagnosis not present

## 2019-07-13 DIAGNOSIS — M6281 Muscle weakness (generalized): Secondary | ICD-10-CM

## 2019-07-13 DIAGNOSIS — R278 Other lack of coordination: Secondary | ICD-10-CM

## 2019-07-13 NOTE — Therapy (Signed)
[facility] [address] [place], [place], [address] Phone: [phone]   Fax:  [phone]  Occupational Therapy Treatment  Patient Details  Name: Andrew Bruce MRN: [id number] Date of Birth: 07/11/1965 Referring Provider (OT): Reesa Chew PA-C   Encounter Date: 07/13/2019   OT End of Session - 07/13/19 1415    Visit Number 13    Number of Visits 17    Date for OT Re-Evaluation 07/10/19    Authorization Type ? MCD pending - pt did NOT have MCD at time of evaluation    Authorization Time Period week 8/8    OT Start Time 1320    OT Stop Time 1405    OT Time Calculation (min) 45 min    Activity Tolerance Patient tolerated treatment well    Behavior During Therapy WFL for tasks assessed/performed           Past Medical History:  Diagnosis Date  . COVID-19 02/06/2019    Past Surgical History:  Procedure Laterality Date  . DIRECT LARYNGOSCOPY Bilateral 04/06/2019   Procedure: MICRO DIRECT LARYNGOSCOPY WITH PROLARYN INJECTION;  Surgeon: Melida Quitter, MD;  Location: [facility];  Service: ENT;  Laterality: Bilateral;  . IR GASTROSTOMY TUBE MOD SED  03/10/2019  . IR GASTROSTOMY TUBE REMOVAL  04/20/2019    There were no vitals filed for this visit.   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 Pt intubated 02/07/19 and trachecd 08/08/34 - ICU complicated by PE and multiple CVA'S, gangrene of toes Lt 1-4 and Rt 1, 2, 3, 5.    Limitations no driving, no heavy lifting    Patient Stated Goals go back to work    Currently in Pain? Yes    Pain Score 2     Pain Location Shoulder    Pain Orientation Right    Pain Descriptors / Indicators Aching    Pain Type Acute pain    Pain Onset 1 to 4 weeks ago    Pain Frequency  Intermittent    Aggravating Factors  malpositioning    Pain Relieving Factors repositioning           Assessed progress towards LTG's - see goal section below. Pt fluctuates in grip strength (when last assessed 36 lbs, today 30 lbs and 33 lbs). Pt improving with coordination (9 hole peg test = 42.69 sec) but limited by continued numbness Rt index finger and often compensates for [facility] by picking up small objects (coins, pegs) with thumb and long finger. Pt also noted to have visual/perceptual deficits when copying peg design from this session and previous sessions requiring cues to correct errors and mod drops continued Rt hand (pt often picking up peg b/t thumb and long finger d/t numbness index finger). Pt required extra time to copy peg design and occasional cues throughout task to either correct or prevent errors.  UBE x 5 min for reciprocal movement pattern with no pain reported.  Also discussed proper bed positioning and provided handout for this as pt reports pain in Rt shoulder in the am.  Discussed via interpreter that pt is out of appointments after this week and if he wishes to continue O.T. as self pay - pt wishes to be placed on hold until  MCD approved. Explained that pt needs to call front office when MCD is approved and that we will have to d/c if pt does not return within 30 days, however he can always get new referral to come back to O.T. - pt verbalized understanding via interpreter.                         OT Short Term Goals - 06/24/19 4034      OT SHORT TERM GOAL #1   Title Independent with HEP for RT hand strength and coordination    Baseline dependent - not yet issued    Time 4    Period Weeks    Status Achieved      OT SHORT TERM GOAL #2   Title Pt to be supervision only for bathing while seated    Baseline dependent    Time 4    Period Weeks    Status Achieved   in standing w/ grab bar     OT SHORT TERM GOAL #3   Title Pt to perform 10 min of  activity with only 1 rest break and no significant change in HR    Baseline Pt gets SOB and requires frequent rest breaks    Time 4    Period Weeks    Status On-going   Pt standing for 10 minutes with O2 sats 94%     OT SHORT TERM GOAL #4   Title Pt to improve grip strength Rt hand to 30 lbs, and Lt hand to 45 lbs    Baseline Rt = 20, Lt = 38 lbs    Time 4    Period Weeks    Status Achieved   36.8-RUE, 45.4-LUE     OT SHORT TERM GOAL #5   Title Improve coordination as evidenced by performing 9 hole peg test in 50 sec or less Rt dominant hand    Baseline Rt = 63.94 sec    Time 4    Period Weeks    Status Achieved   46.25            OT Long Term Goals - 07/13/19 1415      OT LONG TERM GOAL #1   Title Independent with BUE HEP for strength and endurance    Baseline dependent - not yet issued    Time 8    Period Weeks    Status On-going      OT LONG TERM GOAL #2   Title Pt to return to simple cooking tasks for 15 min or greater at mod I level    Baseline dependent    Time 8    Period Weeks    Status Partially Met   with supervision     OT LONG TERM GOAL #3   Title Pt to return to light household work (taking out trash, washing dishes, etc) mod I level without rest breaks    Baseline dependent    Time 8    Period Weeks    Status Achieved      OT LONG TERM GOAL #4   Title Pt to improve grip strength RT dominant hand to 40 lbs or greater    Baseline Rt = 20 lbs    Time 8    Period Weeks    Status On-going   between 30-36 lbs     OT LONG TERM GOAL #5   Title Pt to improve coordination as evidenced by performing 9 hole  peg test in 40 sec or less Rt hand    Baseline 63.94 sec    Time 8    Period Weeks    Status On-going   42.69 sec                Plan - 07/13/19 1416    Clinical Impression Statement See goal section for updates/progress. Pt continues to demo mild shoulder pain which improves w/ proper reaching pattern, deficits in Rt hand coordination,  strength, numbness at index finger, visual (near vision diplopia) and visual/perceptual deficits. Pt would benefit from continued O.T. however wishes to be placed on hold until he gets MCD (He applied over a month ago per pt report).    Occupational performance deficits (Please refer to evaluation for details): ADL's;IADL's;Work;Leisure    Body Structure / Function / Physical Skills ADL;ROM;IADL;Endurance;Body mechanics;Sensation;Mobility;Flexibility;Strength;Coordination;FMC;Pain;UE functional use;Decreased knowledge of use of DME    Rehab Potential Good    OT Frequency 2x / week    OT Duration 8 weeks    OT Treatment/Interventions Self-care/ADL training;Therapeutic exercise;Functional Mobility Training;Aquatic Therapy;Neuromuscular education;Manual Therapy;Energy conservation;Therapeutic activities;Coping strategies training;DME and/or AE instruction;Cognitive remediation/compensation;Visual/perceptual remediation/compensation;Moist Heat;Passive range of motion;Patient/family education    Plan Pt to be place on hold after next session per pt request as pt is currently self pay and awaiting MCD. Pt will need renewal if pt returns within 30 days; otherwise will d/c (pt aware via interpreter)    Consulted and Agree with Plan of Care Patient           Patient will benefit from skilled therapeutic intervention in order to improve the following deficits and impairments:   Body Structure / Function / Physical Skills: ADL, ROM, IADL, Endurance, Body mechanics, Sensation, Mobility, Flexibility, Strength, Coordination, FMC, Pain, UE functional use, Decreased knowledge of use of DME       Visit Diagnosis: Right hemiparesis (HCC)  Other lack of coordination  Muscle weakness (generalized)    Problem List Patient Active Problem List   Diagnosis Date Noted  . 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 Impaired ambulation 06/11/2019  . Dependent on walker for ambulation 04/30/2019  . Right hemiparesis  ([facility]) 04/30/2019  . Tachycardia 04/30/2019  . Acute blood loss anemia   . Uncontrolled type 2 diabetes mellitus with hyperglycemia ([facility])   . 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 Leukocytosis   . Embolic stroke ([facility]) 37/85/8850  . Status post tracheostomy ([facility])   . Pressure injury of skin 03/02/2019  . On mechanically assisted ventilation ([facility])   . Goals of care, counseling/discussion   . Palliative care encounter   . ARDS (adult respiratory distress syndrome) ([facility])   . Cerebral embolism with cerebral infarction 02/17/2019  . Acute respiratory failure with hypoxia ([facility])   . Pneumothorax, right     Carey Bullocks, OTR/L 07/13/2019, 2:23 PM  [facility] [address] [place], [place], [address] Phone: [phone]   Fax:  [phone]  Name: Baylee Mccorkel MRN: [id number] Date of Birth: November 29, 1965

## 2019-07-15 ENCOUNTER — Other Ambulatory Visit: Payer: Self-pay

## 2019-07-15 ENCOUNTER — Ambulatory Visit: Payer: Medicaid Other | Attending: Physical Medicine and Rehabilitation | Admitting: Occupational Therapy

## 2019-07-15 DIAGNOSIS — R490 Dysphonia: Secondary | ICD-10-CM | POA: Diagnosis present

## 2019-07-15 DIAGNOSIS — G8191 Hemiplegia, unspecified affecting right dominant side: Secondary | ICD-10-CM | POA: Insufficient documentation

## 2019-07-15 DIAGNOSIS — M6281 Muscle weakness (generalized): Secondary | ICD-10-CM | POA: Diagnosis present

## 2019-07-15 DIAGNOSIS — R2689 Other abnormalities of gait and mobility: Secondary | ICD-10-CM | POA: Diagnosis present

## 2019-07-15 DIAGNOSIS — R278 Other lack of coordination: Secondary | ICD-10-CM | POA: Diagnosis present

## 2019-07-15 DIAGNOSIS — R2681 Unsteadiness on feet: Secondary | ICD-10-CM | POA: Insufficient documentation

## 2019-07-15 NOTE — Therapy (Signed)
[facility] [address] [place], [place], [address] Phone: [phone]   Fax:  [phone]  Occupational Therapy Treatment  Patient Details  Name: Andrew Bruce MRN: [id number] Date of Birth: 1965/02/11 Referring Provider (OT): Reesa Chew PA-C   Encounter Date: 07/15/2019   OT End of Session - 07/15/19 1313    Visit Number 14    Number of Visits 17    Date for OT Re-Evaluation 07/10/19    Authorization Type ? MCD pending - pt did NOT have MCD at time of evaluation    Authorization Time Period week 8/8    OT Start Time 1235    OT Stop Time 1315    OT Time Calculation (min) 40 min    Activity Tolerance Patient tolerated treatment well    Behavior During Therapy WFL for tasks assessed/performed           Past Medical History:  Diagnosis Date  . COVID-19 02/06/2019    Past Surgical History:  Procedure Laterality Date  . DIRECT LARYNGOSCOPY Bilateral 04/06/2019   Procedure: MICRO DIRECT LARYNGOSCOPY WITH PROLARYN INJECTION;  Surgeon: Melida Quitter, MD;  Location: [facility];  Service: ENT;  Laterality: Bilateral;  . IR GASTROSTOMY TUBE MOD SED  03/10/2019  . IR GASTROSTOMY TUBE REMOVAL  04/20/2019    There were no vitals filed for this visit.   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 Pt intubated 02/07/19 and trachecd 9/75/88 - ICU complicated by PE and multiple CVA'S, gangrene of toes Lt 1-4 and Rt 1, 2, 3, 5.    Limitations no driving, no heavy lifting    Patient Stated Goals go back to work    Currently in Pain? Yes    Pain Score 3     Pain Location Shoulder    Pain Orientation Right    Pain Descriptors / Indicators Aching    Pain Type Acute pain    Pain Onset 1 to 4 weeks ago    Pain Frequency Intermittent    Aggravating Factors  malpositioning    Pain Relieving Factors repositioning           Reviewed previously issued  HEP for neuro re-education RUE and performed each. Discussed/reviewed proper positioning of RUE with reaching to prevent pain.  Functional mid to high level reaching to place pegs in pegboard vertical surface then removing 3 at a time for in hand manipulation.  UBE x 5 min, level 3, for reciprocal movement pattern and endurance/conditioning                      OT Short Term Goals - 06/24/19 0924      OT SHORT TERM GOAL #1   Title Independent with HEP for RT hand strength and coordination    Baseline dependent - not yet issued    Time 4    Period Weeks    Status Achieved      OT SHORT TERM GOAL #2   Title Pt to be supervision only for bathing while seated    Baseline dependent    Time 4    Period Weeks    Status Achieved   in standing w/ grab bar     OT SHORT TERM GOAL #3   Title Pt to perform 10 min of activity with only 1 rest break and no significant change in HR    Baseline  Pt gets SOB and requires frequent rest breaks    Time 4    Period Weeks    Status On-going   Pt standing for 10 minutes with O2 sats 94%     OT SHORT TERM GOAL #4   Title Pt to improve grip strength Rt hand to 30 lbs, and Lt hand to 45 lbs    Baseline Rt = 20, Lt = 38 lbs    Time 4    Period Weeks    Status Achieved   36.8-RUE, 45.4-LUE     OT SHORT TERM GOAL #5   Title Improve coordination as evidenced by performing 9 hole peg test in 50 sec or less Rt dominant hand    Baseline Rt = 63.94 sec    Time 4    Period Weeks    Status Achieved   46.25            OT Long Term Goals - 07/13/19 1415      OT LONG TERM GOAL #1   Title Independent with BUE HEP for strength and endurance    Baseline dependent - not yet issued    Time 8    Period Weeks    Status On-going      OT LONG TERM GOAL #2   Title Pt to return to simple cooking tasks for 15 min or greater at mod I level    Baseline dependent    Time 8    Period Weeks    Status Partially Met   with supervision     OT  LONG TERM GOAL #3   Title Pt to return to light household work (taking out trash, washing dishes, etc) mod I level without rest breaks    Baseline dependent    Time 8    Period Weeks    Status Achieved      OT LONG TERM GOAL #4   Title Pt to improve grip strength RT dominant hand to 40 lbs or greater    Baseline Rt = 20 lbs    Time 8    Period Weeks    Status On-going   between 30-36 lbs     OT LONG TERM GOAL #5   Title Pt to improve coordination as evidenced by performing 9 hole peg test in 40 sec or less Rt hand    Baseline 63.94 sec    Time 8    Period Weeks    Status On-going   42.69 sec                Plan - 07/15/19 1314    Clinical Impression Statement See goal section for updates/progress. Pt continues to demo mild shoulder pain which improves w/ proper reaching pattern, deficits in Rt hand coordination, strength, numbness at index finger, visual (near vision diplopia) and visual/perceptual deficits. Pt would benefit from continued O.T. however wishes to be placed on hold until he gets MCD (He applied over a month ago per pt report).    Occupational performance deficits (Please refer to evaluation for details): ADL's;IADL's;Work;Leisure    Body Structure / Function / Physical Skills ADL;ROM;IADL;Endurance;Body mechanics;Sensation;Mobility;Flexibility;Strength;Coordination;FMC;Pain;UE functional use;Decreased knowledge of use of DME    Rehab Potential Good    Comorbidities impacting occupational performance description: Gangrenous toes, endurance/HR affected from covid    OT Frequency 2x / week    OT Duration 8 weeks    OT Treatment/Interventions Self-care/ADL training;Therapeutic exercise;Functional Mobility Training;Aquatic Therapy;Neuromuscular education;Manual Therapy;Energy conservation;Therapeutic activities;Coping strategies training;DME and/or AE instruction;Cognitive  remediation/compensation;Visual/perceptual remediation/compensation;Moist Heat;Passive range of  motion;Patient/family education    Plan Pt to be place on hold per pt request as pt is currently self pay and awaiting MCD. Pt will need renewal if pt returns within 30 days; otherwise will d/c (pt aware via interpreter)    Consulted and Agree with Plan of Care Patient           Patient will benefit from skilled therapeutic intervention in order to improve the following deficits and impairments:   Body Structure / Function / Physical Skills: ADL, ROM, IADL, Endurance, Body mechanics, Sensation, Mobility, Flexibility, Strength, Coordination, FMC, Pain, UE functional use, Decreased knowledge of use of DME       Visit Diagnosis: Right hemiparesis (HCC)  Other lack of coordination  Muscle weakness (generalized)    Problem List Patient Active Problem List   Diagnosis Date Noted  . 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 Impaired ambulation 06/11/2019  . Dependent on walker for ambulation 04/30/2019  . Right hemiparesis ([facility]) 04/30/2019  . Tachycardia 04/30/2019  . Acute blood loss anemia   . Uncontrolled type 2 diabetes mellitus with hyperglycemia ([facility])   . Dry gangrene ([facility])   . Leukocytosis   . Embolic stroke ([facility]) 28/36/6294  . Status post tracheostomy ([facility])   . Pressure injury of skin 03/02/2019  . On mechanically assisted ventilation ([facility])   . Goals of care, counseling/discussion   . Palliative care encounter   . ARDS (adult respiratory distress syndrome) ([facility])   . Cerebral embolism with cerebral infarction 02/17/2019  . Acute respiratory failure with hypoxia ([facility])   . Pneumothorax, right     Carey Bullocks, OTR/L 07/15/2019, 1:15 PM  [facility] [address] [place], [place], [address] Phone: [phone]   Fax:  [phone]  Name: Andrew Bruce MRN: [id number] Date of Birth: Jan 12, 1966

## 2019-07-16 ENCOUNTER — Ambulatory Visit: Payer: Medicaid Other

## 2019-07-16 DIAGNOSIS — R2689 Other abnormalities of gait and mobility: Secondary | ICD-10-CM

## 2019-07-16 DIAGNOSIS — M6281 Muscle weakness (generalized): Secondary | ICD-10-CM

## 2019-07-16 DIAGNOSIS — R2681 Unsteadiness on feet: Secondary | ICD-10-CM

## 2019-07-16 DIAGNOSIS — G8191 Hemiplegia, unspecified affecting right dominant side: Secondary | ICD-10-CM

## 2019-07-16 DIAGNOSIS — R490 Dysphonia: Secondary | ICD-10-CM

## 2019-07-16 NOTE — Therapy (Signed)
[facility] [address] [place], [place], [address] Phone: [phone]   Fax:  [phone]  Physical Therapy Discharge Summary  Patient Details  Name: Andrew Bruce MRN: [id number] Date of Birth: 1965-04-08 Referring Provider (PT): Reesa Chew, [place]   Encounter Date: 07/16/2019   PT End of Session - 07/16/19 1503    Visit Number 9    Number of Visits 9    Date for PT Re-Evaluation 07/05/19    Authorization Type Eval: 05/10/19 (medicaid)    PT Start Time 1445    PT Stop Time 1530    PT Time Calculation (min) 45 min    Equipment Utilized During Treatment Gait belt    Activity Tolerance Patient tolerated treatment well    Behavior During Therapy [facility] for tasks assessed/performed           Past Medical History:  Diagnosis Date  . COVID-19 02/06/2019    Past Surgical History:  Procedure Laterality Date  . DIRECT LARYNGOSCOPY Bilateral 04/06/2019   Procedure: MICRO DIRECT LARYNGOSCOPY WITH PROLARYN INJECTION;  Surgeon: Melida Quitter, MD;  Location: [facility];  Service: ENT;  Laterality: Bilateral;  . IR GASTROSTOMY TUBE MOD SED  03/10/2019  . IR GASTROSTOMY TUBE REMOVAL  04/20/2019    There were no vitals filed for this visit.     Following exercises reviewed with patient   Home exercise program:  Standing Heel Raise with Support - 1 x daily - 7 x weekly - 3 sets - 10 reps, also reviewed unilateral heel raises: 10x R and L Standing Toe Raises at Chair - 1 x daily - 7 x weekly - 3 sets - 10 reps Mini Squat with Counter Support - 1 x daily - 7 x weekly - 3 sets - 10 reps Side Stepping with Resistance at Ankles - 1 x daily - 7 x weekly - 3 sets - 10 reps Forward Monster Walks - 1 x daily - 7 x weekly - 3 sets - 10 reps Backward Monster Walks - 1 x daily - 7 x weekly - 3 sets - 10 reps Single Leg Stance - 1 x daily - 7 x weekly - 1 sets - 5 reps - 20 hold, educated to work on EYEs closed with HEP Walking Tandem Stance  - 1 x daily - 7 x weekly - 3 sets - 10 reps Lateral Step Down - 1 x daily - 7 x weekly - 3 sets - 10 reps                          PT Short Term Goals - 06/01/19 1019      PT SHORT TERM GOAL #1   Title Patient will be able to ambulate 200 feet with cane or no AD for 200 feet to improve household ambulation    Baseline 2 ww walker (eval); 220 feet without AD (06/01/19)    Time 3    Period Weeks    Status Achieved    Target Date 05/31/19      PT SHORT TERM GOAL #2   Title Pt will be able to perform 5 sit to stands without use of UE for support to improve functional strength    Baseline must use bil hands for support (Eval); 5x sit to stand in 23 seconds    Time 3    Period Weeks    Status Achieved    Target Date 05/31/19  PT SHORT TERM GOAL #3   Title Patient will be able to go up and down curb and ramp with cane of no AD to improve comminty navigation    Baseline Pt able to go up and down ramp and curb: 3x with SBA    Time 3    Period Weeks    Status Achieved    Target Date 05/31/19             PT Long Term Goals - 07/06/19 1808      PT LONG TERM GOAL #1   Title Pt will be able to ambulate with cane or lesser AD for >1500 feet to improve community ambulation.    Baseline must use 2 ww walker, 1025' without AD (06/01/19)    Time 8    Period Weeks    Status Achieved      PT LONG TERM GOAL #2   Title Pt will be able to stand/walk for >1 hour to improve ability to go shopping    Baseline 5-10 min (eval); 30 min (06/01/19)    Time 8    Period Weeks    Status Achieved      PT LONG TERM GOAL #3   Title Pt will demo 56/56 on BBS to improve overall balance and reduce fall risk    Baseline 52/56    Time 8    Period Weeks    Status New                 Plan - 07/16/19 1503    Clinical Impression Statement Patient has been seen for total of 9 sessions for generalized weakness and mobility disorder from 05/10/19 to 07/16/19. Patient has met all  of his short term and long term goal. Patient will be discharged from skilled PT    Personal Factors and Comorbidities Comorbidity 2;Time since onset of injury/illness/exacerbation;Transportation    Comorbidities Hx of CVA, Gangerene of toes    Examination-Activity Limitations Bathing;Hygiene/Grooming;Patent attorney for Others    Examination-Participation Restrictions Community Activity;Driving;Laundry;Yard Work;Shop;Meal Prep    Stability/Clinical Decision Making Evolving/Moderate complexity    Rehab Potential Good    PT Frequency --    PT Duration --    PT Treatment/Interventions ADLs/Self Care Home Management;Gait training;Stair training;Functional mobility training;Therapeutic exercise;Therapeutic activities;Balance training;Patient/family education;Orthotic Fit/Training;Passive range of motion;Manual techniques;Energy conservation;Visual/perceptual remediation/compensation;Joint Manipulations    PT Next Visit Plan Pt discharged.    PT Home Exercise Plan Access Code: [email or web address]: [email or web address]: 06/17/2021Prepared by: Gwenyth Bouillon PatelExercisesStanding Heel Raise with Support - 1 x daily - 7 x weekly - 3 sets - 10 repsStanding Toe Raises at Chair - 1 x daily - 7 x weekly - 3 sets - 10 repsMini Squat with Counter Support - 1 x daily - 7 x weekly - 3 sets - 10 repsSide Stepping with Resistance at Ankles - 1 x daily - 7 x weekly - 3 sets - 10 repsForward Monster Walks - 1 x daily - 7 x weekly - 3 sets - 10 repsBackward Monster Walks - 1 x daily - 7 x weekly - 3 sets - 10 repsSingle Leg Stance - 1 x daily - 7 x weekly - 1 sets - 5 reps - 20 holdWalking Tandem Stance - 1 x daily - 7 x weekly - 3 sets - 10 repsLateral Step Down - 1 x daily - 7 x weekly - 3 sets - 10 reps    Consulted and Agree with Plan of Care Patient;Family member/caregiver    Family Member Consulted  Wife           Patient will benefit from skilled therapeutic intervention in order to improve the  following deficits and impairments:  Abnormal gait, Decreased balance, Decreased mobility, Decreased endurance, Decreased range of motion, Decreased strength, Difficulty walking, Impaired sensation, Pain  Visit Diagnosis: No diagnosis found.     Problem List Patient Active Problem List   Diagnosis Date Noted  . 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 Impaired ambulation 06/11/2019  . Dependent on walker for ambulation 04/30/2019  . Right hemiparesis ([facility]) 04/30/2019  . Tachycardia 04/30/2019  . Acute blood loss anemia   . Uncontrolled type 2 diabetes mellitus with hyperglycemia ([facility])   . Dry gangrene ([facility])   . Leukocytosis   . Embolic stroke ([facility]) 41/32/4401  . Status post tracheostomy ([facility])   . Pressure injury of skin 03/02/2019  . On mechanically assisted ventilation ([facility])   . Goals of care, counseling/discussion   . Palliative care encounter   . ARDS (adult respiratory distress syndrome) ([facility])   . Cerebral embolism with cerebral infarction 02/17/2019  . Acute respiratory failure with hypoxia ([facility])   . Pneumothorax, right     Kerrie Pleasure, PT 07/16/2019, 3:19 PM  [facility] [address] [place], [place], [address] Phone: [phone]   Fax:  [phone]  Name: Andrew Bruce MRN: [id number] Date of Birth: [birth year]

## 2019-07-16 NOTE — Therapy (Signed)
[facility] [address] [place], [place], [address] Phone: [phone]   Fax:  [phone]  Speech Language Pathology Treatment  Patient Details  Name: Andrew Bruce MRN: [id number] Date of Birth: [birth year] Referring Provider (SLP): Thad Ranger, NP   Encounter Date: 07/16/2019   End of Session - 07/16/19 2257    Visit Number 7    Number of Visits 17    Date for SLP Re-Evaluation 08/10/19    SLP Start Time 1404    SLP Stop Time  1445    SLP Time Calculation (min) 41 min    Activity Tolerance Patient tolerated treatment well           Past Medical History:  Diagnosis Date  . COVID-19 02/06/2019    Past Surgical History:  Procedure Laterality Date  . DIRECT LARYNGOSCOPY Bilateral 04/06/2019   Procedure: MICRO DIRECT LARYNGOSCOPY WITH PROLARYN INJECTION;  Surgeon: Christia Reading, MD;  Location: [facility] OR;  Service: ENT;  Laterality: Bilateral;  . IR GASTROSTOMY TUBE MOD SED  03/10/2019  . IR GASTROSTOMY TUBE REMOVAL  04/20/2019    There were no vitals filed for this visit.          ADULT SLP TREATMENT - 07/16/19 1415      General Information   Behavior/Cognition Alert;Cooperative;Pleasant mood      Treatment Provided   Treatment provided Cognitive-Linquistic      Cognitive-Linquistic Treatment   Treatment focused on Voice;Patient/family/caregiver education    Skilled Treatment With Andrew Bruce, interpreter, SLP explained that handout from previous SLP Skeet Latch) explains how to access the "speaking" functionon pt's phone to read text messages to him. SLP guided pt through EMST exercises - pt req'd initial cues for quick strong breath as he was blowing as long as he could. Pt also became light-headed quickly with 10 reps at a time. SLP had pt change to 6 reps with a rest x5 sets. With IMST, pt again req'd initial cue for strong and quick inhalation. SLP again told pt to have 6 reps x5 sets. Pt had more difficulty  with inhalation, SLP asked and pt stated he practiced EMST more frequently than IMST. SLP reiterated IMST is just as important as EMST. SLP made sure pt did not want to schedule more visits at this time "II'm sitll waitng to hear from the MEdicaid" SLP explained that ST will do as OT and will hold pt chart for 30 days and then d/c if not heard from pt. AT that time pt would need another prescription to be seen again.       Assessment / Recommendations / Plan   Plan --   pt on hold for 30 days then d/c if no more appointments     Progression Toward Goals   Progression toward goals Progressing toward goals            SLP Education - 07/16/19 2256    Education Details on hold status, IMST, EMST procedure    Person(s) Educated Patient    Methods Explanation    Comprehension Verbalized understanding              SLP Long Term Goals - 07/16/19 1438      SLP LONG TERM GOAL #1   Title pt will report following 3 aspects of vocal conservation program between 4 sessions    Baseline no aspects;    Time 3    Period Weeks   or visits, for all LTGs   Status On-going  SLP LONG TERM GOAL #2   Title Pt will complete exercises for vocal fold adduction with rare min A x3 visits.    Baseline not provided yet; 06-22-19, 07-16-19    Time 3    Period Weeks    Status On-going      SLP LONG TERM GOAL #3   Title pt will use abdominal breathing in 5 minutes simple conversation in 3 sessions    Baseline chest breathing    Time 3    Period Weeks    Status On-going      SLP LONG TERM GOAL #4   Title pt will use WNL vocal effort without strain/pushing for vocalization with rare min A during 3 ST sessions    Baseline strainedpushed until SPL told pt not to do so    Time 3    Period Weeks    Status On-going      SLP LONG TERM GOAL #5   Title pt will rate his voice at least 7/10 (where 10=WNL voice)    Baseline 5/10    Time 4    Period Weeks    Status On-going      SLP LONG TERM GOAL #6    Title Patient will tell SLP 3 signs of aspiration PNA.    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 Today SLP reviewed vocal fold adduction exercises; question mild expressive language/reading impairments. SLP to assess PRN if pt returns later to skilled ST. He will be placed on hold currently for 30 days and will be d/c'd if no more ST appointments are made. SLP believes pt will benefit from at least x1/week ST for exercises for vocal fold adduction, vocal hygiene and education/training on abdominal breathing to maximize breath support for speech.    Speech Therapy Frequency 1x /week    Duration --   8 weeks   Treatment/Interventions Functional tasks;Internal/external aids;Patient/family education;Compensatory strategies;SLP instruction and feedback;Other (comment);Aspiration precaution training;Pharyngeal strengthening exercises;Diet toleration management by SLP;Trials of upgraded texture/liquids   vocal hygiene program, vocal function/adduction exercises   Potential to Achieve Goals Good   tentative potential- without ENT consult difficult to ascertain   Potential Considerations Financial resources    SLP Home Exercise Plan provided today    Consulted and Agree with Plan of Care Patient           Patient will benefit from skilled therapeutic intervention in order to improve the following deficits and impairments:   Hoarseness of voice    Problem List Patient Active Problem List   Diagnosis Date Noted  . 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 Impaired ambulation 06/11/2019  . Dependent on walker for  ambulation 04/30/2019  . Right hemiparesis (HCC) 04/30/2019  . Tachycardia 04/30/2019  . Acute blood loss anemia   . Uncontrolled type 2 diabetes mellitus with hyperglycemia (HCC)   . Dry gangrene (HCC)   . Leukocytosis   . 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 Status post tracheostomy (HCC)   .  Pressure injury of skin 03/02/2019  . On mechanically assisted ventilation (HCC)   . Goals of care, counseling/discussion   . Palliative care encounter   . ARDS (adult respiratory distress syndrome) (HCC)   . Cerebral embolism with cerebral infarction 02/17/2019  . Acute respiratory failure with hypoxia (HCC)   . Pneumothorax, right     [facility] ,MS, CCC-SLP  07/16/2019, 10:59 PM  [facility] [address] [place], [place], [address] Phone: [phone]   Fax:  [phone]   Name: Andrew Bruce MRN: [id number] Date of Birth: 03-Aug-1965

## 2019-07-29 ENCOUNTER — Other Ambulatory Visit: Payer: Self-pay | Admitting: Nurse Practitioner

## 2019-07-29 DIAGNOSIS — R262 Difficulty in walking, not elsewhere classified: Secondary | ICD-10-CM

## 2019-07-29 DIAGNOSIS — I96 Gangrene, not elsewhere classified: Secondary | ICD-10-CM

## 2019-07-29 DIAGNOSIS — G8191 Hemiplegia, unspecified affecting right dominant side: Secondary | ICD-10-CM

## 2019-07-30 ENCOUNTER — Other Ambulatory Visit: Payer: Self-pay

## 2019-07-30 ENCOUNTER — Ambulatory Visit (INDEPENDENT_AMBULATORY_CARE_PROVIDER_SITE_OTHER): Payer: Medicaid Other | Admitting: Physician Assistant

## 2019-07-30 VITALS — BP 144/89 | HR 90 | Temp 98.2°F | Resp 20 | Ht 66.0 in | Wt 166.4 lb

## 2019-07-30 DIAGNOSIS — I96 Gangrene, not elsewhere classified: Secondary | ICD-10-CM | POA: Diagnosis not present

## 2019-07-30 DIAGNOSIS — L97529 Non-pressure chronic ulcer of other part of left foot with unspecified severity: Secondary | ICD-10-CM | POA: Diagnosis not present

## 2019-07-30 DIAGNOSIS — L97519 Non-pressure chronic ulcer of other part of right foot with unspecified severity: Secondary | ICD-10-CM

## 2019-07-30 NOTE — H&P (View-Only) (Signed)
Office Note     CC:  follow up Requesting Provider:  No ref. provider found Interview and exam conducted with the use of interpreter services and the patient's wife, who does speak English, is also in attendance.  HPI: Andrew Bruce is a 54 y.o. (1965-11-09) male who presents for follow-up of gangrenous changes to multiple toes bilaterally.  The patient has a history of COVID-19 pneumonia with ARDS with a prolonged hospitalization including tracheostomy mechanical ventilation.  He was seen in the hospital secondary to what was felt to be pressure induced ischemic changes to his toes.  He currently complains of pain in both feet.  He is ambulating with Darco shoes.  No rest pain.  He and his wife endorse foul odor from toe wounds.  The pt is on BB, alpha-1 blocker for hypertension His wife states he is no longer taking any other of the medications that are currently on his medication list including his Eliquis. Tobacco hx:  no  Past Medical History:  Diagnosis Date  . COVID-19 02/06/2019    Past Surgical History:  Procedure Laterality Date  . DIRECT LARYNGOSCOPY Bilateral 04/06/2019   Procedure: MICRO DIRECT LARYNGOSCOPY WITH PROLARYN INJECTION;  Surgeon: Melida Quitter, MD;  Location: [facility];  Service: ENT;  Laterality: Bilateral;  . IR GASTROSTOMY TUBE MOD SED  03/10/2019  . IR GASTROSTOMY TUBE REMOVAL  04/20/2019    Social History   Socioeconomic History  . Marital status: Married    Spouse name: Not on file  . Number of children: Not on file  . Years of education: Not on file  . Highest education level: Not on file  Occupational History  . Not on file  Tobacco Use  . Smoking status: Former Research scientist (life sciences)  . Smokeless tobacco: Never Used  Vaping Use  . Vaping Use: Never used  Substance and Sexual Activity  . Alcohol use: Yes  . Drug use: Never  . Sexual activity: Yes  Other Topics Concern  . Not on file  Social History Narrative  . Not on file   Social Determinants of Health     Financial Resource Strain:   . Difficulty of Paying Living Expenses:   Food Insecurity:   . Worried About Charity fundraiser in the Last Year:   . Arboriculturist in the Last Year:   Transportation Needs:   . Film/video editor (Medical):   Marland Kitchen Lack of Transportation (Non-Medical):   Physical Activity:   . Days of Exercise per Week:   . Minutes of Exercise per Session:   Stress:   . Feeling of Stress :   Social Connections:   . Frequency of Communication with Friends and Family:   . Frequency of Social Gatherings with Friends and Family:   . Attends Religious Services:   . Active Member of Clubs or Organizations:   . Attends Archivist Meetings:   Marland Kitchen Marital Status:   Intimate Partner Violence:   . Fear of Current or Ex-Partner:   . Emotionally Abused:   Marland Kitchen Physically Abused:   . Sexually Abused:     Family History  Problem Relation Age of Onset  . Healthy Sister   . Healthy Brother    Medications not reconciled. Patient states only taking "blood pressure" medication. Current Outpatient Medications  Medication Sig Dispense Refill  . acetaminophen (TYLENOL) 325 MG tablet Take 1-2 tablets (325-650 mg total) by mouth every 4 (four) hours as needed for mild pain. 100 tablet 0  .  Amino Acids-Protein Hydrolys (FEEDING SUPPLEMENT, PRO-STAT SUGAR FREE 64,) LIQD Take 30 mLs by mouth 3 (three) times daily. 887 mL 0  . apixaban (ELIQUIS) 5 MG TABS tablet Take 1 tablet (5 mg total) by mouth 2 (two) times daily. 60 tablet 1  . ascorbic acid (VITAMIN C) 500 MG tablet Take 1 tablet (500 mg total) by mouth daily. 30 tablet 0  . atorvastatin (LIPITOR) 20 MG tablet Take 1 tablet (20 mg total) by mouth daily at 6 PM. 30 tablet 0  . blood glucose meter kit and supplies KIT Dispense based on patient and insurance preference. Use up to four times daily as directed. (FOR ICD-9 250.00, 250.01). 1 each 0  . doxazosin (CARDURA) 1 MG tablet Take 1 tablet (1 mg total) by mouth at bedtime.  30 tablet 0  . famotidine (PEPCID) 20 MG tablet Take 1 tablet (20 mg total) by mouth 2 (two) times daily. 60 tablet 1  . glipiZIDE (GLUCOTROL) 5 MG tablet Take 0.5 tablets (2.5 mg total) by mouth daily before breakfast. 30 tablet 0  . guaiFENesin (ROBITUSSIN) 100 MG/5ML SOLN Take 10 mLs (200 mg total) by mouth every 6 (six) hours as needed for cough or to loosen phlegm. 236 mL 0  . hydroxypropyl methylcellulose / hypromellose (ISOPTO TEARS / GONIOVISC) 2.5 % ophthalmic solution Place 1 drop into both eyes 4 (four) times daily as needed for dry eyes. 15 mL 12  . iron polysaccharides (NIFEREX) 150 MG capsule Take 1 capsule (150 mg total) by mouth 2 (two) times daily. 60 capsule 0  . metFORMIN (GLUCOPHAGE) 500 MG tablet Take 1 tablet (500 mg total) by mouth 2 (two) times daily with a meal. 60 tablet 0  . metoprolol tartrate (LOPRESSOR) 25 MG tablet Take 0.5 tablets (12.5 mg total) by mouth 2 (two) times daily. 30 tablet 0  . potassium chloride (KLOR-CON) 10 MEQ tablet Take 1 tablet (10 mEq total) by mouth daily. 30 tablet 0  . senna-docusate (SENOKOT-S) 8.6-50 MG tablet Place 1 tablet into feeding tube at bedtime as needed for mild constipation. 30 tablet 0  . traMADol (ULTRAM) 50 MG tablet Take 1 tablet (50 mg total) by mouth every 6 (six) hours as needed for moderate pain or severe pain. 120 tablet 3  . traZODone (DESYREL) 50 MG tablet Take 0.5 tablets (25 mg total) by mouth at bedtime as needed for sleep. 15 tablet 0  . zinc sulfate 220 (50 Zn) MG capsule Take 1 capsule (220 mg total) by mouth daily. 30 capsule 0   No current facility-administered medications for this visit.    No Known Allergies   REVIEW OF SYSTEMS:   [X]  denotes positive finding, [ ]  denotes negative finding Cardiac  Comments:  Chest pain or chest pressure:    Shortness of breath upon exertion:    Short of breath when lying flat:    Irregular heart rhythm:        Vascular    Pain in calf, thigh, or hip brought on by  ambulation:    Pain in feet at night that wakes you up from your sleep:     Blood clot in your veins:    Leg swelling:         Pulmonary    Oxygen at home:    Productive cough:     Wheezing:         Neurologic    Sudden weakness in arms or legs:     Sudden numbness in arms or  legs:     Sudden onset of difficulty speaking or slurred speech:    Temporary loss of vision in one eye:     Problems with dizziness:         Gastrointestinal    Blood in stool:     Vomited blood:         Genitourinary    Burning when urinating:     Blood in urine:        Psychiatric    Major depression:         Hematologic    Bleeding problems:    Problems with blood clotting too easily:        Skin    Rashes or ulcers:        Constitutional    Fever or chills:      PHYSICAL EXAMINATION:  Vitals:   07/30/19 0915  BP: (!) 144/89  Pulse: 90  Resp: 20  Temp: 98.2 F (36.8 C)  SpO2: 98%   General:  WDWN in NAD; vital signs documented above Gait: Not observed HENT: WNL, normocephalic Pulmonary: normal non-labored breathing , without Rales, rhonchi,  wheezing Cardiac: regular HR, without  Murmurs  Skin: with rashes Extremities: with ischemic changes, with Gangrene , without cellulitis; with open wounds;  There are 2+ dorsalis pedis pulses bilaterally Musculoskeletal: no muscle wasting or atrophy  Neurologic: A&O X 3;  No focal weakness or paresthesias are detected Psychiatric:  The pt has Normal affect.        ASSESSMENT/PLAN:: 54 y.o. male here for follow up for ischemic changes to bilateral toes including dry gangrene, edema and drainage from right first and second toes, left first toe.  We discussed amputation of at least his right first, second and left first toes to speed healing and remove devitalized tissue.  I explained he also may need debridement and/or amputation of left second, third fourth toes.  The patient and his wife are in agreement with this plan.  Recommended  application of Betadine solution to affected toes daily.   Barbie Banner, PA-C [facility] [phone]  Clinic MD:   Donzetta Matters

## 2019-07-30 NOTE — Progress Notes (Signed)
Office Note     CC:  follow up Requesting Provider:  No ref. provider found Interview and exam conducted with the use of interpreter services and the patient's wife, who does speak English, is also in attendance.  HPI: Andrew Bruce is a 54 y.o. (1965/02/07) male who presents for follow-up of gangrenous changes to multiple toes bilaterally.  The patient has a history of COVID-19 pneumonia with ARDS with a prolonged hospitalization including tracheostomy mechanical ventilation.  He was seen in the hospital secondary to what was felt to be pressure induced ischemic changes to his toes.  He currently complains of pain in both feet.  He is ambulating with Darco shoes.  No rest pain.  He and his wife endorse foul odor from toe wounds.  The pt is on BB, alpha-1 blocker for hypertension His wife states he is no longer taking any other of the medications that are currently on his medication list including his Eliquis. Tobacco hx:  no  Past Medical History:  Diagnosis Date  . COVID-19 02/06/2019    Past Surgical History:  Procedure Laterality Date  . DIRECT LARYNGOSCOPY Bilateral 04/06/2019   Procedure: MICRO DIRECT LARYNGOSCOPY WITH PROLARYN INJECTION;  Surgeon: Melida Quitter, MD;  Location: [facility];  Service: ENT;  Laterality: Bilateral;  . IR GASTROSTOMY TUBE MOD SED  03/10/2019  . IR GASTROSTOMY TUBE REMOVAL  04/20/2019    Social History   Socioeconomic History  . Marital status: Married    Spouse name: Not on file  . Number of children: Not on file  . Years of education: Not on file  . Highest education level: Not on file  Occupational History  . Not on file  Tobacco Use  . Smoking status: Former Research scientist (life sciences)  . Smokeless tobacco: Never Used  Vaping Use  . Vaping Use: Never used  Substance and Sexual Activity  . Alcohol use: Yes  . Drug use: Never  . Sexual activity: Yes  Other Topics Concern  . Not on file  Social History Narrative  . Not on file   Social Determinants of Health     Financial Resource Strain:   . Difficulty of Paying Living Expenses:   Food Insecurity:   . Worried About Charity fundraiser in the Last Year:   . Arboriculturist in the Last Year:   Transportation Needs:   . Film/video editor (Medical):   Marland Kitchen Lack of Transportation (Non-Medical):   Physical Activity:   . Days of Exercise per Week:   . Minutes of Exercise per Session:   Stress:   . Feeling of Stress :   Social Connections:   . Frequency of Communication with Friends and Family:   . Frequency of Social Gatherings with Friends and Family:   . Attends Religious Services:   . Active Member of Clubs or Organizations:   . Attends Archivist Meetings:   Marland Kitchen Marital Status:   Intimate Partner Violence:   . Fear of Current or Ex-Partner:   . Emotionally Abused:   Marland Kitchen Physically Abused:   . Sexually Abused:     Family History  Problem Relation Age of Onset  . Healthy Sister   . Healthy Brother    Medications not reconciled. Patient states only taking "blood pressure" medication. Current Outpatient Medications  Medication Sig Dispense Refill  . acetaminophen (TYLENOL) 325 MG tablet Take 1-2 tablets (325-650 mg total) by mouth every 4 (four) hours as needed for mild pain. 100 tablet 0  .  Amino Acids-Protein Hydrolys (FEEDING SUPPLEMENT, PRO-STAT SUGAR FREE 64,) LIQD Take 30 mLs by mouth 3 (three) times daily. 887 mL 0  . apixaban (ELIQUIS) 5 MG TABS tablet Take 1 tablet (5 mg total) by mouth 2 (two) times daily. 60 tablet 1  . ascorbic acid (VITAMIN C) 500 MG tablet Take 1 tablet (500 mg total) by mouth daily. 30 tablet 0  . atorvastatin (LIPITOR) 20 MG tablet Take 1 tablet (20 mg total) by mouth daily at 6 PM. 30 tablet 0  . blood glucose meter kit and supplies KIT Dispense based on patient and insurance preference. Use up to four times daily as directed. (FOR ICD-9 250.00, 250.01). 1 each 0  . doxazosin (CARDURA) 1 MG tablet Take 1 tablet (1 mg total) by mouth at bedtime.  30 tablet 0  . famotidine (PEPCID) 20 MG tablet Take 1 tablet (20 mg total) by mouth 2 (two) times daily. 60 tablet 1  . glipiZIDE (GLUCOTROL) 5 MG tablet Take 0.5 tablets (2.5 mg total) by mouth daily before breakfast. 30 tablet 0  . guaiFENesin (ROBITUSSIN) 100 MG/5ML SOLN Take 10 mLs (200 mg total) by mouth every 6 (six) hours as needed for cough or to loosen phlegm. 236 mL 0  . hydroxypropyl methylcellulose / hypromellose (ISOPTO TEARS / GONIOVISC) 2.5 % ophthalmic solution Place 1 drop into both eyes 4 (four) times daily as needed for dry eyes. 15 mL 12  . iron polysaccharides (NIFEREX) 150 MG capsule Take 1 capsule (150 mg total) by mouth 2 (two) times daily. 60 capsule 0  . metFORMIN (GLUCOPHAGE) 500 MG tablet Take 1 tablet (500 mg total) by mouth 2 (two) times daily with a meal. 60 tablet 0  . metoprolol tartrate (LOPRESSOR) 25 MG tablet Take 0.5 tablets (12.5 mg total) by mouth 2 (two) times daily. 30 tablet 0  . potassium chloride (KLOR-CON) 10 MEQ tablet Take 1 tablet (10 mEq total) by mouth daily. 30 tablet 0  . senna-docusate (SENOKOT-S) 8.6-50 MG tablet Place 1 tablet into feeding tube at bedtime as needed for mild constipation. 30 tablet 0  . traMADol (ULTRAM) 50 MG tablet Take 1 tablet (50 mg total) by mouth every 6 (six) hours as needed for moderate pain or severe pain. 120 tablet 3  . traZODone (DESYREL) 50 MG tablet Take 0.5 tablets (25 mg total) by mouth at bedtime as needed for sleep. 15 tablet 0  . zinc sulfate 220 (50 Zn) MG capsule Take 1 capsule (220 mg total) by mouth daily. 30 capsule 0   No current facility-administered medications for this visit.    No Known Allergies   REVIEW OF SYSTEMS:   [X]  denotes positive finding, [ ]  denotes negative finding Cardiac  Comments:  Chest pain or chest pressure:    Shortness of breath upon exertion:    Short of breath when lying flat:    Irregular heart rhythm:        Vascular    Pain in calf, thigh, or hip brought on by  ambulation:    Pain in feet at night that wakes you up from your sleep:     Blood clot in your veins:    Leg swelling:         Pulmonary    Oxygen at home:    Productive cough:     Wheezing:         Neurologic    Sudden weakness in arms or legs:     Sudden numbness in arms or  legs:     Sudden onset of difficulty speaking or slurred speech:    Temporary loss of vision in one eye:     Problems with dizziness:         Gastrointestinal    Blood in stool:     Vomited blood:         Genitourinary    Burning when urinating:     Blood in urine:        Psychiatric    Major depression:         Hematologic    Bleeding problems:    Problems with blood clotting too easily:        Skin    Rashes or ulcers:        Constitutional    Fever or chills:      PHYSICAL EXAMINATION:  Vitals:   07/30/19 0915  BP: (!) 144/89  Pulse: 90  Resp: 20  Temp: 98.2 F (36.8 C)  SpO2: 98%   General:  WDWN in NAD; vital signs documented above Gait: Not observed HENT: WNL, normocephalic Pulmonary: normal non-labored breathing , without Rales, rhonchi,  wheezing Cardiac: regular HR, without  Murmurs  Skin: with rashes Extremities: with ischemic changes, with Gangrene , without cellulitis; with open wounds;  There are 2+ dorsalis pedis pulses bilaterally Musculoskeletal: no muscle wasting or atrophy  Neurologic: A&O X 3;  No focal weakness or paresthesias are detected Psychiatric:  The pt has Normal affect.        ASSESSMENT/PLAN:: 54 y.o. male here for follow up for ischemic changes to bilateral toes including dry gangrene, edema and drainage from right first and second toes, left first toe.  We discussed amputation of at least his right first, second and left first toes to speed healing and remove devitalized tissue.  I explained he also may need debridement and/or amputation of left second, third fourth toes.  The patient and his wife are in agreement with this plan.  Recommended  application of Betadine solution to affected toes daily.   Barbie Banner, PA-C [facility] [phone]  Clinic MD:   Donzetta Matters

## 2019-08-02 ENCOUNTER — Other Ambulatory Visit: Payer: Self-pay

## 2019-08-10 ENCOUNTER — Telehealth: Payer: Self-pay

## 2019-08-10 NOTE — Addendum Note (Signed)
Addended by: Primitivo Gauze on: 08/10/2019 12:26 PM   Modules accepted: Orders

## 2019-08-10 NOTE — Telephone Encounter (Signed)
Pt's daughter called with concerns regarding her father's toes that are scheduled for amputation 8/12. He had a fever over the weekend. They have been applying betadine daily. Pt has been moved up to surgery this Friday.

## 2019-08-11 ENCOUNTER — Ambulatory Visit: Payer: Self-pay | Admitting: Nurse Practitioner

## 2019-08-12 ENCOUNTER — Other Ambulatory Visit: Payer: Self-pay

## 2019-08-12 ENCOUNTER — Encounter (HOSPITAL_COMMUNITY): Payer: Self-pay | Admitting: Vascular Surgery

## 2019-08-12 ENCOUNTER — Other Ambulatory Visit (HOSPITAL_COMMUNITY)
Admission: RE | Admit: 2019-08-12 | Discharge: 2019-08-12 | Disposition: A | Discharge status: Home / Self Care | Payer: Medicaid Other | Source: Ambulatory Visit | Attending: Vascular Surgery | Admitting: Vascular Surgery

## 2019-08-12 DIAGNOSIS — Z20822 Contact with and (suspected) exposure to covid-19: Secondary | ICD-10-CM | POA: Insufficient documentation

## 2019-08-12 DIAGNOSIS — Z01812 Encounter for preprocedural laboratory examination: Secondary | ICD-10-CM | POA: Diagnosis not present

## 2019-08-12 LAB — SARS CORONAVIRUS 2 (TAT 6-24 HRS): SARS Coronavirus 2: NEGATIVE

## 2019-08-12 NOTE — Progress Notes (Signed)
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  History includes former smoker, HTN, DM2 (diagnosed 02/2019), CVA (02/17/19), NGEXB-28 (04/27/22, complicated by COVID ARDS, thromboembolic disease with CVA, PE, aortic thrombus with ischemic toes).   Hospitalized 02/06/19-03/23/19 for COVID ARDS. He was initially seen in ED on 01/26/19 with cough, fever, chills, body aches and was diagnosed with COVID-19 and discharged on doxycycline. He presented again on 02/06/19 with worsening SOB and hypoxia consistent with COVID ARDS. He was started on remdesivir, dexamethasone, and Lovenox prophylaxis. He was intubated 02/07/19 and required bilateral chest tubes on 02/08/19 for bilateral pneumothoraces extensive subcutaneous emphysema. Also hyperglycemic on admission (without prior history of DM) with A1c 12.6% and was started on Levemir plus SSI. On 02/17/19 he was diagnosed thromboembolic disease including left occipital CVA (multi-focal ischemic infarct likely due to cardiac emboli in setting of COVID) and LLL PE. Echo consistent with cor pulmonale. He was transitioned to full doe anticoagulation. He also was noted to have aortic thrombus with ischemic toes (thrombus not seen on follow-up 02/25/19 CT). He underwent tracheostomy 03/02/19 and removal of chest tubes 03/03/19. He had dysphagia and vocal cord dysfunction and PEG tube placed 03/10/19 and underwent treatments by ST.He received wound care for a sacral pressure ulcer. He was discharged to [facility] for on-going rehab  (03/23/19-04/20/19). ENT Melida Quitter, MD performed suspended microdirect laryngoscopy with Prolaryn and Kenalog injections on 04/06/19 for vocal cord paralysis. Tracheostomy decannulated 04/12/19. Gastrostomy tube removed 04/20/19.   He had vascular surgery follow-up on 07/30/19 for ischemic toes with worsening changes including gangrene, edema, and drainage. The above procedures recommended. He is to hold Eliqluis for 3 days prior to surgery per [facility]--however, he is no longer taking medications consistently. Reportedly, he felt they were keeping his pain medications from working. He takes metformin periodically. He was instructed to take metoprolol on the day of surgery.   He is a same day work-up, so anesthesia team to evaluate on the day of surgery. His last A1c was 5.8 on 05/13/19. He periodically checks home CBGs with readings ~ 120-140 fasting. His EKG is from March and done in the setting of prolonged hospitalization for COVID PNA/ARDS, PE).   VS:  BP Readings from Last 3 Encounters:  07/30/19 (!) 144/89  06/25/19 (!) 144/98  06/10/19 140/86   Pulse Readings from Last 3 Encounters:  07/30/19 90  06/25/19 85  06/10/19 94     PROVIDERS: Patient, No Pcp Per   LABS: He is for updated labs on arrival. As of 05/13/19, glucose 131, Cr 0.60, H/H 14.1/44.4, PLT 386, A1c 5.8 (down from 11.7 02/17/19 and 12.6 on 02/06/19)   IMAGES: 1V CXR 03/24/19 (in setting of prolonged hospitalization for COVID PNA/ARDS): FINDINGS: Cardiac shadow is stable. Right-sided PICC line is noted and stable. Tracheostomy tube is again seen and stable. Patchy infiltrates are identified bilaterally slightly increased when compared with the prior exam. No pneumothorax or sizable effusion is seen. IMPRESSION: Slight increase  in patchy opacities bilaterally. Tubes and lines as described.   EKG: EKG 03/19/19 (in setting of prolonged hospitalization for COVID PNA/ARDS): Sinus tachycardia at 104 bpm Possible Right  ventricular hypertrophy Lateral infarct , age undetermined Inferior infarct , age undetermined Abnormal ECG Since last tracing anterior T wave changes have resolved Confirmed by Larae Grooms [phone]) on 03/21/2019 12:58:02 AM   CV: Echo (limited) 02/17/19 (in setting of COVID ARDS, PE): IMPRESSIONS  1. Left ventricular ejection fraction, by visual estimation, is 55 to  60%. The left ventricle has normal function. There is no increased left  ventricular wall thickness.  2. The left ventricle has no regional wall motion abnormalities.  3. Global right ventricle has moderately reduced systolic function.The  right ventricular size is mildly enlarged. no increase in right  ventricular wall thickness.  4. Hypokinetic right ventricular free wall. McConnell's sign is present.  5. Right ventricle findings are consistent with acute cor pulmonale, such  as pulmonary embolism.  6. Small pericardial effusion.  7. The pericardial effusion is anterior to the right ventricle.  8. The tricuspid valve was normal in structure. Tricuspid valve  regurgitation is trivial.  9. The aortic root was not well visualized.  10. Mildly elevated pulmonary artery systolic pressure.  11. The inferior vena cava is dilated in size with <50% respiratory  variability, suggesting severely increased right atrial pressure, butt his  is a nonspecific finding due to positive pressure ventilation.  12. Findings reviewed with Dr. Charlsie Quest, MD.  - Pulmonology thought cor pulmonale was due to PE with ARDS   Past Medical History:  Diagnosis Date  . COVID-19 02/06/2019  . Diabetes mellitus without complication ([place])   . Hypertension   . PE (pulmonary thromboembolism) ([place])    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  . Pneumonia    with Covid  . Stroke [facility])    weakness on right side, occurred while he had Covid    Past Surgical History:  Procedure Laterality Date  . DIRECT LARYNGOSCOPY  Bilateral 04/06/2019   Procedure: MICRO DIRECT LARYNGOSCOPY WITH PROLARYN INJECTION;  Surgeon: Melida Quitter, MD;  Location: [place];  Service: ENT;  Laterality: Bilateral;  . IR GASTROSTOMY TUBE MOD SED  03/10/2019  . IR GASTROSTOMY TUBE REMOVAL  04/20/2019    MEDICATIONS: No current facility-administered medications for this encounter.   Marland Kitchen acetaminophen (TYLENOL) 325 MG tablet  . traMADol (ULTRAM) 50 MG tablet  . Amino Acids-Protein Hydrolys (FEEDING SUPPLEMENT, PRO-STAT SUGAR FREE 64,) LIQD  . apixaban (ELIQUIS) 5 MG TABS tablet  . ascorbic acid (VITAMIN C) 500 MG tablet  . atorvastatin (LIPITOR) 20 MG tablet  . blood glucose meter kit and supplies KIT  . doxazosin (CARDURA) 1 MG tablet  . famotidine (PEPCID) 20 MG tablet  . glipiZIDE (GLUCOTROL) 5 MG tablet  . guaiFENesin (ROBITUSSIN) 100 MG/5ML SOLN  . hydroxypropyl methylcellulose / hypromellose (ISOPTO TEARS / GONIOVISC) 2.5 % ophthalmic solution  . iron polysaccharides (NIFEREX) 150 MG capsule  . metFORMIN (GLUCOPHAGE) 500 MG tablet  . metoprolol tartrate (LOPRESSOR) 25 MG tablet  . potassium chloride (KLOR-CON) 10 MEQ tablet  . senna-docusate (SENOKOT-S) 8.6-50 MG tablet  . traZODone (DESYREL) 50 MG tablet  . zinc sulfate 220 (50 Zn) MG capsule   Currently, he is not consistently taking any of his medications other than as needed pain medications.   Myra Gianotti, PA-C Surgical Short Stay/Anesthesiology [facility] Phone [phone] [facility] Phone [phone] 08/12/2019 1:03 PM

## 2019-08-12 NOTE — Progress Notes (Signed)
Spoke with pt's daughter, [name] for pre-op call. Her number was listed as the number to call for pt. He does not speak [place]. Pt had Covid in February, in hospital for 74 days. Pt had a stroke during this time and was diagnosed with HTN and Diabetes. Pt's last A1C was 11.7 on 02/17/19. [name] states he had another one done in May but she doesn't know what it was or exactly where it was done. She states her father checks his blood sugar occasionally, runs around 120-140 fasting. Pt has stopped all his medications because he thought they were keeping the pain medication from working. She states he has taken his Metformin occasionally. His last dose of Eliquis was approx 2 weeks ago. I did instruct [name] to have pt take his Metoprolol in the AM prior to surgery. Instructed her to have pt check his blood sugar in the AM when he gets up and every 2 hours until he leaves for the hospital. If blood sugar is 70 or below, treat with 1/2 cup of clear juice (apple or cranberry) and recheck blood sugar 15 minutes after drinking juice. If blood sugar continues to be 70 or below, call the Short Stay department and ask to speak to a nurse. [name] voiced understanding.  Pt to have Covid test done today. [name] given quarantine instructions and voiced understanding.  Chart sent to Anesthesia PA for review.

## 2019-08-12 NOTE — Anesthesia Preprocedure Evaluation (Addendum)
Anesthesia Evaluation  Patient identified by MRN, date of birth, ID band Patient awake    Reviewed: Allergy & Precautions, NPO status , Patient's Chart, lab work & pertinent test results  Airway Mallampati: II  TM Distance: >3 FB Neck ROM: Full    Dental  (+) Poor Dentition, Dental Advisory Given   Pulmonary former smoker,    Pulmonary exam normal        Cardiovascular hypertension, Normal cardiovascular exam  Echo (limited) 02/17/19 (in setting of COVID ARDS, PE): IMPRESSIONS  1. Left ventricular ejection fraction, by visual estimation, is 55 to  60%. The left ventricle has normal function. There is no increased left  ventricular wall thickness.  2. The left ventricle has no regional wall motion abnormalities.  3. Global right ventricle has moderately reduced systolic function.The  right ventricular size is mildly enlarged. no increase in right  ventricular wall thickness.  4. Hypokinetic right ventricular free wall. McConnell's sign is present.  5. Right ventricle findings are consistent with acute cor pulmonale, such  as pulmonary embolism.  6. Small pericardial effusion.  7. The pericardial effusion is anterior to the right ventricle.  8. The tricuspid valve was normal in structure. Tricuspid valve  regurgitation is trivial.  9. The aortic root was not well visualized.  10. Mildly elevated pulmonary artery systolic pressure.  11. The inferior vena cava is dilated in size with <50% respiratory  variability, suggesting severely increased right atrial pressure, butt his  is a nonspecific finding due to positive pressure ventilation.  12. Findings reviewed with Dr. Adaline Sill, MD.  - Pulmonology thought cor pulmonale was due to PE with ARDS    Neuro/Psych negative neurological ROS  negative psych ROS   GI/Hepatic negative GI ROS, Neg liver ROS,   Endo/Other  diabetes  Renal/GU negative Renal ROS  negative  genitourinary   Musculoskeletal negative musculoskeletal ROS (+)   Abdominal   Peds negative pediatric ROS (+)  Hematology negative hematology ROS (+)   Anesthesia Other Findings   Reproductive/Obstetrics negative OB ROS                           Anesthesia Physical Anesthesia Plan  ASA: III  Anesthesia Plan: General   Post-op Pain Management:    Induction: Intravenous  PONV Risk Score and Plan: 3 and Ondansetron, Dexamethasone and Midazolam  Airway Management Planned: LMA  Additional Equipment:   Intra-op Plan:   Post-operative Plan: Extubation in OR  Informed Consent: I have reviewed the patients History and Physical, chart, labs and discussed the procedure including the risks, benefits and alternatives for the proposed anesthesia with the patient or authorized representative who has indicated his/her understanding and acceptance.     Dental advisory given and Interpreter used for interveiw  Plan Discussed with: CRNA and Anesthesiologist  Anesthesia Plan Comments: (See PAT note written 08/12/2019 by Shonna Chock, PA-C regarding prolonged hospitalization for COVID ARDS. )       Anesthesia Quick Evaluation

## 2019-08-13 ENCOUNTER — Ambulatory Visit (HOSPITAL_COMMUNITY)
Admission: RE | Admit: 2019-08-13 | Discharge: 2019-08-13 | Disposition: A | Discharge status: Home / Self Care | Payer: Medicaid Other | Attending: Vascular Surgery | Admitting: Vascular Surgery

## 2019-08-13 ENCOUNTER — Encounter (HOSPITAL_COMMUNITY): Payer: Self-pay | Admitting: Vascular Surgery

## 2019-08-13 ENCOUNTER — Encounter (HOSPITAL_COMMUNITY)
Admission: RE | Disposition: A | Discharge status: Home / Self Care | Payer: Self-pay | Source: Home / Self Care | Attending: Vascular Surgery

## 2019-08-13 ENCOUNTER — Inpatient Hospital Stay (HOSPITAL_COMMUNITY): Payer: Medicaid Other | Admitting: Vascular Surgery

## 2019-08-13 ENCOUNTER — Other Ambulatory Visit: Payer: Self-pay

## 2019-08-13 DIAGNOSIS — E119 Type 2 diabetes mellitus without complications: Secondary | ICD-10-CM | POA: Diagnosis not present

## 2019-08-13 DIAGNOSIS — J939 Pneumothorax, unspecified: Secondary | ICD-10-CM | POA: Diagnosis not present

## 2019-08-13 DIAGNOSIS — Z79899 Other long term (current) drug therapy: Secondary | ICD-10-CM | POA: Diagnosis not present

## 2019-08-13 DIAGNOSIS — I10 Essential (primary) hypertension: Secondary | ICD-10-CM | POA: Insufficient documentation

## 2019-08-13 DIAGNOSIS — B948 Sequelae of other specified infectious and parasitic diseases: Secondary | ICD-10-CM | POA: Diagnosis not present

## 2019-08-13 DIAGNOSIS — I96 Gangrene, not elsewhere classified: Secondary | ICD-10-CM | POA: Diagnosis not present

## 2019-08-13 DIAGNOSIS — Z8673 Personal history of transient ischemic attack (TIA), and cerebral infarction without residual deficits: Secondary | ICD-10-CM | POA: Diagnosis not present

## 2019-08-13 DIAGNOSIS — Z8616 Personal history of COVID-19: Secondary | ICD-10-CM | POA: Diagnosis not present

## 2019-08-13 DIAGNOSIS — Z86711 Personal history of pulmonary embolism: Secondary | ICD-10-CM | POA: Insufficient documentation

## 2019-08-13 DIAGNOSIS — Z87891 Personal history of nicotine dependence: Secondary | ICD-10-CM | POA: Diagnosis not present

## 2019-08-13 DIAGNOSIS — E1152 Type 2 diabetes mellitus with diabetic peripheral angiopathy with gangrene: Secondary | ICD-10-CM | POA: Diagnosis not present

## 2019-08-13 DIAGNOSIS — D62 Acute posthemorrhagic anemia: Secondary | ICD-10-CM | POA: Diagnosis not present

## 2019-08-13 DIAGNOSIS — D72829 Elevated white blood cell count, unspecified: Secondary | ICD-10-CM | POA: Diagnosis not present

## 2019-08-13 HISTORY — DX: Pneumonia, unspecified organism: J18.9

## 2019-08-13 HISTORY — PX: AMPUTATION: SHX166

## 2019-08-13 HISTORY — DX: Essential (primary) hypertension: I10

## 2019-08-13 HISTORY — PX: WOUND DEBRIDEMENT: SHX247

## 2019-08-13 HISTORY — DX: Type 2 diabetes mellitus without complications: E11.9

## 2019-08-13 HISTORY — DX: Other pulmonary embolism without acute cor pulmonale: I26.99

## 2019-08-13 HISTORY — DX: Cerebral infarction, unspecified: I63.9

## 2019-08-13 LAB — CBC
HCT: 42.1 % (ref 39.0–52.0)
Hemoglobin: 13.5 g/dL (ref 13.0–17.0)
MCH: 28 pg (ref 26.0–34.0)
MCHC: 32.1 g/dL (ref 30.0–36.0)
MCV: 87.3 fL (ref 80.0–100.0)
Platelets: 364 10*3/uL (ref 150–400)
RBC: 4.82 MIL/uL (ref 4.22–5.81)
RDW: 14 % (ref 11.5–15.5)
WBC: 6.1 10*3/uL (ref 4.0–10.5)
nRBC: 0 % (ref 0.0–0.2)

## 2019-08-13 LAB — COMPREHENSIVE METABOLIC PANEL
ALT: 15 U/L (ref 0–44)
AST: 13 U/L — ABNORMAL LOW (ref 15–41)
Albumin: 3.4 g/dL — ABNORMAL LOW (ref 3.5–5.0)
Alkaline Phosphatase: 60 U/L (ref 38–126)
Anion gap: 12 (ref 5–15)
BUN: 12 mg/dL (ref 6–20)
CO2: 22 mmol/L (ref 22–32)
Calcium: 9.2 mg/dL (ref 8.9–10.3)
Chloride: 104 mmol/L (ref 98–111)
Creatinine, Ser: 0.64 mg/dL (ref 0.61–1.24)
GFR calc Af Amer: >60 mL/min (ref >60)
GFR calc non Af Amer: >60 mL/min (ref >60)
Glucose, Bld: 119 mg/dL — ABNORMAL HIGH (ref 70–99)
Potassium: 3.6 mmol/L (ref 3.5–5.1)
Sodium: 138 mmol/L (ref 135–145)
Total Bilirubin: 0.9 mg/dL (ref 0.3–1.2)
Total Protein: 6.8 g/dL (ref 6.5–8.1)

## 2019-08-13 LAB — PROTIME-INR
INR: 1 (ref 0.8–1.2)
Prothrombin Time: 12.9 seconds (ref 11.4–15.2)

## 2019-08-13 LAB — TYPE AND SCREEN
ABO/RH(D): O POS
Antibody Screen: NEGATIVE

## 2019-08-13 LAB — GLUCOSE, CAPILLARY
Glucose-Capillary: 119 mg/dL — ABNORMAL HIGH (ref 70–99)
Glucose-Capillary: 125 mg/dL — ABNORMAL HIGH (ref 70–99)

## 2019-08-13 LAB — APTT: aPTT: 29 seconds (ref 24–36)

## 2019-08-13 SURGERY — AMPUTATION DIGIT
Anesthesia: General | Site: Foot | Laterality: Right

## 2019-08-13 MED ORDER — LIDOCAINE 2% (20 MG/ML) 5 ML SYRINGE
INTRAMUSCULAR | Status: AC
  Filled 2019-08-13: qty 5

## 2019-08-13 MED ORDER — PROMETHAZINE HCL 25 MG/ML IJ SOLN: 6.2500 mg | INTRAMUSCULAR | Status: DC | PRN

## 2019-08-13 MED ORDER — OXYCODONE-ACETAMINOPHEN 5-325 MG PO TABS
ORAL_TABLET | ORAL | Status: AC
  Filled 2019-08-13: qty 1

## 2019-08-13 MED ORDER — ACETAMINOPHEN 500 MG PO TABS
1000.0000 mg | ORAL_TABLET | Freq: Once | ORAL | Status: AC
  Administered 2019-08-13: 1000 mg via ORAL
  Filled 2019-08-13: qty 2

## 2019-08-13 MED ORDER — LACTATED RINGERS IV SOLN: INTRAVENOUS | Status: DC

## 2019-08-13 MED ORDER — FENTANYL CITRATE (PF) 250 MCG/5ML IJ SOLN
INTRAMUSCULAR | Status: AC
  Filled 2019-08-13: qty 5

## 2019-08-13 MED ORDER — FENTANYL CITRATE (PF) 100 MCG/2ML IJ SOLN
INTRAMUSCULAR | Status: DC | PRN
  Administered 2019-08-13 (×2): 50 ug via INTRAVENOUS

## 2019-08-13 MED ORDER — CEFAZOLIN SODIUM-DEXTROSE 2-4 GM/100ML-% IV SOLN
2.0000 g | INTRAVENOUS | Status: AC
  Administered 2019-08-13: 2 g via INTRAVENOUS
  Filled 2019-08-13: qty 100

## 2019-08-13 MED ORDER — CHLORHEXIDINE GLUCONATE CLOTH 2 % EX PADS: 6.0000 | MEDICATED_PAD | Freq: Once | CUTANEOUS | Status: DC

## 2019-08-13 MED ORDER — EPHEDRINE 5 MG/ML INJ
INTRAVENOUS | Status: AC
  Filled 2019-08-13: qty 10

## 2019-08-13 MED ORDER — PHENYLEPHRINE 40 MCG/ML (10ML) SYRINGE FOR IV PUSH (FOR BLOOD PRESSURE SUPPORT)
PREFILLED_SYRINGE | INTRAVENOUS | Status: DC | PRN
  Administered 2019-08-13: 80 ug via INTRAVENOUS
  Administered 2019-08-13: 120 ug via INTRAVENOUS
  Administered 2019-08-13: 40 ug via INTRAVENOUS
  Administered 2019-08-13: 160 ug via INTRAVENOUS

## 2019-08-13 MED ORDER — PROPOFOL 10 MG/ML IV BOLUS
INTRAVENOUS | Status: DC | PRN
  Administered 2019-08-13: 80 mg via INTRAVENOUS
  Administered 2019-08-13: 120 mg via INTRAVENOUS

## 2019-08-13 MED ORDER — EPHEDRINE SULFATE-NACL 50-0.9 MG/10ML-% IV SOSY
PREFILLED_SYRINGE | INTRAVENOUS | Status: DC | PRN
  Administered 2019-08-13: 10 mg via INTRAVENOUS

## 2019-08-13 MED ORDER — CEFAZOLIN SODIUM-DEXTROSE 2-4 GM/100ML-% IV SOLN
2.0000 g | INTRAVENOUS | Status: DC
Start: 2019-08-13 — End: 2019-08-13

## 2019-08-13 MED ORDER — 0.9 % SODIUM CHLORIDE (POUR BTL) OPTIME
TOPICAL | Status: DC | PRN
  Administered 2019-08-13: 1000 mL

## 2019-08-13 MED ORDER — PHENYLEPHRINE 40 MCG/ML (10ML) SYRINGE FOR IV PUSH (FOR BLOOD PRESSURE SUPPORT)
PREFILLED_SYRINGE | INTRAVENOUS | Status: AC
  Filled 2019-08-13: qty 10

## 2019-08-13 MED ORDER — FENTANYL CITRATE (PF) 100 MCG/2ML IJ SOLN
25.0000 ug | INTRAMUSCULAR | Status: DC | PRN
  Administered 2019-08-13 (×2): 50 ug via INTRAVENOUS

## 2019-08-13 MED ORDER — MIDAZOLAM HCL 5 MG/5ML IJ SOLN
INTRAMUSCULAR | Status: DC | PRN
  Administered 2019-08-13: 2 mg via INTRAVENOUS

## 2019-08-13 MED ORDER — OXYCODONE-ACETAMINOPHEN 5-325 MG PO TABS: 1.0000 | ORAL_TABLET | ORAL | 0 refills | Status: DC | PRN

## 2019-08-13 MED ORDER — DEXAMETHASONE SODIUM PHOSPHATE 4 MG/ML IJ SOLN
INTRAMUSCULAR | Status: DC | PRN
Start: 2019-08-13 — End: 2019-08-13
  Administered 2019-08-13: 4 mg via INTRAVENOUS

## 2019-08-13 MED ORDER — PHENYLEPHRINE HCL-NACL 10-0.9 MG/250ML-% IV SOLN
INTRAVENOUS | Status: DC | PRN
Start: 2019-08-13 — End: 2019-08-13
  Administered 2019-08-13: 25 ug/min via INTRAVENOUS

## 2019-08-13 MED ORDER — ONDANSETRON HCL 4 MG/2ML IJ SOLN
INTRAMUSCULAR | Status: AC
  Filled 2019-08-13: qty 2

## 2019-08-13 MED ORDER — OXYCODONE-ACETAMINOPHEN 5-325 MG PO TABS
1.0000 | ORAL_TABLET | Freq: Once | ORAL | Status: AC
  Administered 2019-08-13: 1 via ORAL

## 2019-08-13 MED ORDER — CHLORHEXIDINE GLUCONATE 0.12 % MT SOLN
OROMUCOSAL | Status: AC
  Administered 2019-08-13: 15 mL
  Filled 2019-08-13: qty 15

## 2019-08-13 MED ORDER — MIDAZOLAM HCL 2 MG/2ML IJ SOLN
INTRAMUSCULAR | Status: AC
  Filled 2019-08-13: qty 2

## 2019-08-13 MED ORDER — DEXAMETHASONE SODIUM PHOSPHATE 10 MG/ML IJ SOLN
INTRAMUSCULAR | Status: AC
  Filled 2019-08-13: qty 1

## 2019-08-13 MED ORDER — CHLORHEXIDINE GLUCONATE CLOTH 2 % EX PADS
6.0000 | MEDICATED_PAD | Freq: Once | CUTANEOUS | Status: DC
Start: 2019-08-13 — End: 2019-08-13

## 2019-08-13 MED ORDER — FENTANYL CITRATE (PF) 100 MCG/2ML IJ SOLN
INTRAMUSCULAR | Status: AC
  Filled 2019-08-13: qty 2

## 2019-08-13 MED ORDER — ONDANSETRON HCL 4 MG/2ML IJ SOLN
INTRAMUSCULAR | Status: DC | PRN
  Administered 2019-08-13: 4 mg via INTRAVENOUS

## 2019-08-13 MED ORDER — CELECOXIB 200 MG PO CAPS
200.0000 mg | ORAL_CAPSULE | Freq: Once | ORAL | Status: AC
  Administered 2019-08-13: 200 mg via ORAL
  Filled 2019-08-13: qty 1

## 2019-08-13 MED ORDER — LIDOCAINE HCL (CARDIAC) PF 100 MG/5ML IV SOSY
PREFILLED_SYRINGE | INTRAVENOUS | Status: DC | PRN
  Administered 2019-08-13: 100 mg via INTRAVENOUS

## 2019-08-13 MED ORDER — LACTATED RINGERS IV SOLN: INTRAVENOUS | Status: DC | PRN

## 2019-08-13 SURGICAL SUPPLY — 41 items
BNDG COHESIVE 4X5 TAN STRL (GAUZE/BANDAGES/DRESSINGS) ×8 IMPLANT
BNDG GAUZE ELAST 4 BULKY (GAUZE/BANDAGES/DRESSINGS) ×8 IMPLANT
BRUSH SCRUB EZ PLAIN DRY (MISCELLANEOUS) ×12 IMPLANT
CANISTER SUCT 3000ML PPV (MISCELLANEOUS) ×4 IMPLANT
CLIP LIGATING EXTRA MED SLVR (CLIP) ×4 IMPLANT
CLIP LIGATING EXTRA SM BLUE (MISCELLANEOUS) ×4 IMPLANT
COVER SURGICAL LIGHT HANDLE (MISCELLANEOUS) ×12 IMPLANT
COVER WAND RF STERILE (DRAPES) IMPLANT
DRAPE BILATERAL LIMB T (DRAPES) ×4 IMPLANT
DRAPE EXTREMITY T 121X128X90 (DISPOSABLE) ×4 IMPLANT
DRSG XEROFORM 1X8 (GAUZE/BANDAGES/DRESSINGS) ×8 IMPLANT
ELECT REM PT RETURN 9FT ADLT (ELECTROSURGICAL) ×4
ELECTRODE REM PT RTRN 9FT ADLT (ELECTROSURGICAL) ×2 IMPLANT
GAUZE SPONGE 4X4 12PLY STRL (GAUZE/BANDAGES/DRESSINGS) ×4 IMPLANT
GAUZE SPONGE 4X4 12PLY STRL LF (GAUZE/BANDAGES/DRESSINGS) ×8 IMPLANT
GLOVE BIO SURGEON STRL SZ7 (GLOVE) ×4 IMPLANT
GLOVE BIOGEL PI IND STRL 6.5 (GLOVE) ×2 IMPLANT
GLOVE BIOGEL PI IND STRL 7.5 (GLOVE) ×2 IMPLANT
GLOVE BIOGEL PI IND STRL 8.5 (GLOVE) ×2 IMPLANT
GLOVE BIOGEL PI INDICATOR 6.5 (GLOVE) ×2
GLOVE BIOGEL PI INDICATOR 7.5 (GLOVE) ×2
GLOVE BIOGEL PI INDICATOR 8.5 (GLOVE) ×2
GLOVE ECLIPSE 7.5 STRL STRAW (GLOVE) ×12 IMPLANT
GLOVE SS BIOGEL STRL SZ 7.5 (GLOVE) ×2 IMPLANT
GLOVE SUPERSENSE BIOGEL SZ 7.5 (GLOVE) ×2
GOWN STRL REUS W/ TWL LRG LVL3 (GOWN DISPOSABLE) ×6 IMPLANT
GOWN STRL REUS W/TWL LRG LVL3 (GOWN DISPOSABLE) ×12
KIT BASIN OR (CUSTOM PROCEDURE TRAY) ×4 IMPLANT
KIT TURNOVER KIT B (KITS) ×4 IMPLANT
NEEDLE 22X1 1/2 (OR ONLY) (NEEDLE) IMPLANT
NS IRRIG 1000ML POUR BTL (IV SOLUTION) ×4 IMPLANT
PACK GENERAL/GYN (CUSTOM PROCEDURE TRAY) ×4 IMPLANT
PAD ARMBOARD 7.5X6 YLW CONV (MISCELLANEOUS) ×8 IMPLANT
SUT ETHILON 3 0 PS 1 (SUTURE) ×16 IMPLANT
SUT VIC AB 3-0 SH 27 (SUTURE) ×4
SUT VIC AB 3-0 SH 27X BRD (SUTURE) ×2 IMPLANT
SYR BULB IRRIG 60ML STRL (SYRINGE) ×4 IMPLANT
SYR CONTROL 10ML LL (SYRINGE) IMPLANT
TOWEL GREEN STERILE (TOWEL DISPOSABLE) ×8 IMPLANT
UNDERPAD 30X36 HEAVY ABSORB (UNDERPADS AND DIAPERS) ×4 IMPLANT
WATER STERILE IRR 1000ML POUR (IV SOLUTION) ×4 IMPLANT

## 2019-08-13 NOTE — Op Note (Signed)
    OPERATIVE REPORT  DATE OF SURGERY: 08/13/2019  PATIENT: Andrew Bruce, 54 y.o. male MRN: [id number]  DOB: 1965-01-25  PRE-OPERATIVE DIAGNOSIS: Gangrene left first second third and fourth toe tips, gangrene right great and second toe  POST-OPERATIVE DIAGNOSIS:  Same  PROCEDURE: Amputation of left first second third and fourth toes and right first and second toe  SURGEON:  Gretta Began, M.D.  PHYSICIAN ASSISTANT: Nurse  The assistant was needed for exposure and to expedite the case  ANESTHESIA: General  EBL: per anesthesia record  Total I/O In: 700 [I.V.:600; IV Piggyback:100] Out: 430 [Urine:400; Blood:30]  BLOOD ADMINISTERED: none  DRAINS: none  SPECIMEN: none  COUNTS CORRECT:  YES  PATIENT DISPOSITION:  PACU - hemodynamically stable  PROCEDURE DETAILS: Patient had severe Covid infection in April of this year and was hypotensive and suffered "Covid toes".  He had gangrenous changes of his toes at that time and has been allowed to demarcate.  He is now admitted to outpatient surgery for debridement of necrotic tissue.  This involves his entire right great and second toe and involves his left great toe and then the tips of his second third and fourth toes.  These have demarcated with a dry gangrene and bone is exposed on most of these toes.  Both lower extremities and feet were prepped draped you sterile fashion.  The area of necrotic eschar was debrided on the fourth third and second toes on the left.  Rongeur was used to debride the bone back further proximally.  The patient had excellent bleeding with normal flow to his feet bilaterally.  The great toe was amputated with a fishmouth incision at the base of the toe.  The metatarsal head was not removed.  The wounds were irrigated with saline and wounds were closed with 3-0 nylon mattress sutures.  Attention was then turned to the right foot.  The right great and second toe are gangrenous with bone exposed at the base.   This was debrided and again a fishmouth was incision was made at the base of the great toe on the right.  The digital bone was divided and rongeur was used for proximal debridement of the bone to allow closure without tension.  Similarly the bone was debrided proximally on the second toe.  These wounds were likewise closed with 3-0 nylon mattress sutures.  Sterile dressing was applied and the patient was transferred to the recovery room in stable condition   Larina Earthly, M.D., [facility] 08/13/2019 2:04 PM

## 2019-08-13 NOTE — Interval H&P Note (Signed)
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  The various methods of treatment have been discussed with the patient and family. After consideration of risks, benefits and other options for treatment, the patient has consented to  Procedure(s): AMPUTATION RIGHT FIRST AND SECOND TOES AND LEFT FIRST TOE (Bilateral) POSSIBLE AMPUTATION AND DEBRIDEMENT OF LEFT SECOND, THIRD AND FOURTH TOES (Left) as a surgical intervention.  The patient's history has been reviewed, patient examined, no change in status, stable for surgery.  I have reviewed the patient's chart and labs.  Questions were answered to the patient's satisfaction.     Gretta Began

## 2019-08-13 NOTE — Transfer of Care (Signed)
Immediate Anesthesia Transfer of Care Note  Patient: Andrew Bruce  Procedure(s) Performed: AMPUTATION RIGHT FIRST AND SECOND TOES (Right Foot) AMPUTATION  OF LEFT FIRST TOE, AND DEBRIDEMENT OF LEFT SECOND, THIRD AND FOURTH TOES (Left Foot)  Patient Location: PACU  Anesthesia Type:General  Level of Consciousness: awake, oriented and patient cooperative  Airway & Oxygen Therapy: Patient Spontanous Breathing and Patient connected to nasal cannula oxygen  Post-op Assessment: Report given to RN and Post -op Vital signs reviewed and stable  Post vital signs: Reviewed  Last Vitals:  Vitals Value Taken Time  BP 171/97 08/13/19 1424  Temp    Pulse 77 08/13/19 1427  Resp 19 08/13/19 1427  SpO2 99 % 08/13/19 1427  Vitals shown include unvalidated device data.  Last Pain:  Vitals:   08/13/19 0923  TempSrc:   PainSc: 3       Patients Stated Pain Goal: 3 (08/13/19 5427)  Complications: No complications documented.

## 2019-08-13 NOTE — Interval H&P Note (Signed)
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  The various methods of treatment have been discussed with the patient and family. After consideration of risks, benefits and other options for treatment, the patient has consented to  Procedure(s): AMPUTATION RIGHT FIRST AND SECOND TOES AND LEFT FIRST TOE (Bilateral) POSSIBLE AMPUTATION AND DEBRIDEMENT OF LEFT SECOND, THIRD AND FOURTH TOES (Left) as a surgical intervention.  The patient's history has been reviewed, patient examined, no change in status, stable for surgery.  I have reviewed the patient's chart and labs.  Questions were answered to the patient's satisfaction.     Gretta Began

## 2019-08-13 NOTE — Anesthesia Postprocedure Evaluation (Signed)
Anesthesia Post Note  Patient: Eduar Kumpf  Procedure(s) Performed: AMPUTATION RIGHT FIRST AND SECOND TOES (Right Foot) AMPUTATION  OF LEFT FIRST TOE, AND DEBRIDEMENT OF LEFT SECOND, THIRD AND FOURTH TOES (Left Foot)     Patient location during evaluation: PACU Anesthesia Type: General Level of consciousness: sedated Pain management: pain level controlled Vital Signs Assessment: post-procedure vital signs reviewed and stable Respiratory status: spontaneous breathing and respiratory function stable Cardiovascular status: stable Postop Assessment: no apparent nausea or vomiting Anesthetic complications: no   No complications documented.  Last Vitals:  Vitals:   08/13/19 1500 08/13/19 1510  BP:  (!) 151/94  Pulse: 86 76  Resp: 17 14  Temp:    SpO2: 98% 96%    Last Pain:  Vitals:   08/13/19 1455  TempSrc:   PainSc: 5                  Nolberto Cheuvront DANIEL

## 2019-08-13 NOTE — Discharge Instructions (Signed)
Amputacin de un dedo del pie, cuidados posteriores Toe Amputation, Care After Esta hoja le brinda informacin sobre cmo cuidarse despus del procedimiento. Su mdico tambin podr darle instrucciones ms especficas. Comunquese con el mdico si tiene problemas o preguntas. Qu puedo esperar despus del procedimiento? Despus del procedimiento, es comn tener un poco de Engineer, mining. El dolor, en general, mejora en el transcurso de [place]. Siga estas instrucciones en su casa: Medicamentos  [employer] de venta libre y los recetados solamente como se lo haya indicado el mdico.  Si le recetaron un antibitico, tmelo como se lo haya indicado el mdico. No deje de tomar el antibitico aunque comience a sentirse mejor.  Pregntele al mdico si el medicamento recetado: ? Hace que sea necesario que evite conducir o usar maquinaria pesada. ? Puede causarle estreimiento. Es posible que deba tomar medidas para prevenir o tratar el estreimiento, por ejemplo:  Beber suficiente lquido como para Pharmacologist la orina de color amarillo plido.  Tomar medicamentos recetados o de [employer].  Consumir alimentos ricos en fibra, como frijoles, cereales integrales, y frutas y verduras frescas.  Limitar el consumo de alimentos ricos en grasa y azcares procesados, como los alimentos fritos o dulces. Control del dolor, la rigidez y la hinchazn   Si se lo indican, aplique hielo sobre la zona dolorida. ? Ponga el hielo en una bolsa plstica. ? Coloque una [employer] piel y Copy. ? Coloque el hielo durante , 2 a 3veces por da.  Mueva los otros dedos del pie con frecuencia para reducir la rigidez y la hinchazn.  Cuando est sentado o acostado, levante (eleve) el pie por encima del nivel del corazn.  Baos  No tome baos de inmersin, no nade, no use el jacuzzi ni sumerja el pie en agua hasta que el mdico lo autorice. Pregntele al mdico si puede ducharse. Delle Reining solo  le permitan darse baos de [place].  Si le han quitado el vendaje, puede lavarse la piel con jabn y agua tibia. Actividad  Haga reposo como se lo haya indicado el mdico.  Evite estar sentado durante largos perodos sin moverse. Levntese y camine un poco cada 1 a 2 horas. Esto es importante para mejorar el flujo sanguneo y la respiracin. Pida ayuda si se siente dbil o inestable.  Si le dieron [employer], selas como se lo haya indicado el mdico.  Haga ejercicios como se lo haya indicado el mdico.  Retome sus actividades normales segn lo indicado por el mdico. Pregntele al mdico qu actividades son seguras para usted. Instrucciones generales  No conduzca durante 24horas si le administraron un sedante durante el procedimiento.  No consuma ningn producto que contenga nicotina o tabaco, como cigarrillos, cigarrillos electrnicos y tabaco de Theatre manager. Si necesita ayuda para dejar de fumar, consulte al [employer].  Consulte al [employer] uso de calzado especial o de plantillas para que lo ayuden a Pharmacologist el equilibrio al Advertising account planner.  Si tiene diabetes, mantenga el nivel de azcar en la sangre bajo control y revsese los pies diariamente para ver si aparecen llagas o reas abiertas.  Concurra a todas las visitas de [address] se lo haya indicado el mdico. Esto es importante. Comunquese con un mdico si:  Tiene signos de infeccin en el lugar de la incisin, tales como: ? Enrojecimiento, hinchazn o dolor. ? Lquido o sangre. ? Calor. ? Pus o mal olor.  Tiene fiebre o escalofros.  El vendaje se empapa con sangre.  Las suturas  se abren o se separan.  Tiene adormecimiento u hormigueo en los dedos del pie o en el pie.  Tiene el pie fro o plido, o cambia de color.  El dolor no mejora despus de Designer, industrial/product. Solicite ayuda inmediatamente si tiene:  Dolor o hinchazn cerca de la incisin que empeora o que no desaparece.  Lneas rojas en la piel cerca de  los dedos del pie, o cerca del pie o de la pierna.  Dolor en la pantorrilla o detrs de la rodilla.  Falta de aire.  Dolor en el pecho. Resumen  Despus del procedimiento, es comn tener un poco de Engineer, mining. El dolor, en general, mejora en el transcurso de [place].  Si le recetaron un antibitico, tmelo como se lo haya indicado el mdico. No deje de tomar el antibitico aunque comience a sentirse mejor.  Cambie los vendajes como se lo haya indicado el mdico.  Comunquese con un mdico si tiene signos de infeccin.  Concurra a todas las visitas de [address] se lo haya indicado el mdico. Esto es importante.  Esta informacin no tiene Theme park manager el consejo del mdico. Asegrese de hacerle al mdico cualquier pregunta que tenga.

## 2019-08-13 NOTE — Anesthesia Procedure Notes (Signed)
Procedure Name: LMA Insertion Date/Time: 08/13/2019 12:47 PM Performed by: Lovie Chol, CRNA Pre-anesthesia Checklist: Patient identified, Emergency Drugs available, Suction available and Patient being monitored Patient Re-evaluated:Patient Re-evaluated prior to induction Oxygen Delivery Method: Circle System Utilized Preoxygenation: Pre-oxygenation with 100% oxygen Induction Type: IV induction Ventilation: Mask ventilation without difficulty LMA: LMA inserted LMA Size: 4.0 Number of attempts: 1 Airway Equipment and Method: Bite block Placement Confirmation: positive ETCO2 and breath sounds checked- equal and bilateral Tube secured with: Tape Dental Injury: Teeth and Oropharynx as per pre-operative assessment

## 2019-08-14 ENCOUNTER — Encounter (HOSPITAL_COMMUNITY): Payer: Self-pay | Admitting: Vascular Surgery

## 2019-08-17 ENCOUNTER — Ambulatory Visit: Payer: Medicaid Other | Admitting: Vascular Surgery

## 2019-08-19 ENCOUNTER — Other Ambulatory Visit (HOSPITAL_COMMUNITY): Payer: Medicaid Other

## 2019-08-19 ENCOUNTER — Ambulatory Visit (INDEPENDENT_AMBULATORY_CARE_PROVIDER_SITE_OTHER): Payer: Self-pay | Admitting: Physician Assistant

## 2019-08-19 VITALS — BP 150/94 | HR 84 | Temp 97.8°F | Resp 16 | Ht 66.0 in | Wt 170.0 lb

## 2019-08-19 DIAGNOSIS — I96 Gangrene, not elsewhere classified: Secondary | ICD-10-CM

## 2019-08-19 NOTE — Progress Notes (Signed)
  POST OPERATIVE OFFICE NOTE    CC:  F/u for surgery  HPI:  This is a 54 y.o. male who is s/p second toe amputation  on 08/13/2019 by Dr. Arbie Cookey.   He was diagnosed with COVID and developed pneumonia with hypotension requiring pressors.  He developed ischemic toe tips.  He maintained palpable pedal pulses.  He had an embolic stroke and now will start out patient rehab since he has Medicaid now for insurance.    Pt returns today for follow up.   He denise fever and chills.  He denise much pain and his wife is doing his dressing changes.    No Known Allergies  No current facility-administered medications for this visit.   Current Outpatient Medications  Medication Sig Dispense Refill  . oxyCODONE-acetaminophen (PERCOCET) 5-325 MG tablet Take 1 tablet by mouth every 4 (four) hours as needed for severe pain. 20 tablet 0   Facility-Administered Medications Ordered in Other Visits  Medication Dose Route Frequency Provider Last Rate Last Admin  . Chlorhexidine Gluconate Cloth 2 % PADS 6 each  6 each Topical Once Cephus Shelling, MD      . Chlorhexidine Gluconate Cloth 2 % PADS 6 each  6 each Topical Once Early, Kristen Loader, MD      . fentaNYL (SUBLIMAZE) injection 25-50 mcg  25-50 mcg Intravenous Q5 min PRN Heather Roberts, MD   50 mcg at 08/13/19 1456  . lactated ringers infusion   Intravenous Continuous Early, Kristen Loader, MD      . promethazine (PHENERGAN) injection 6.25-12.5 mg  6.25-12.5 mg Intravenous Q15 min PRN Heather Roberts, MD         ROS:  See HPI  Physical Exam:    Incision:  Well healing toe incisions with bleeding after dressing is removed.  Intact nylon sutures. Extremities:  Palpable DP pulses B.   Lungs non labored breathing   Assessment/Plan:  This is a 54 y.o. male who is s/p:Amputation of left first second third and fourth toes and right first and second toe    He will f/u in 1-2 weeks for suture removal.  Weight bearing as tolerates in sandals.  Daily dressing  changes.  Shower as needed and wash feet.  He may need to see [employer] for shoe needs or inserts after the sutures are removed.   Mosetta Pigeon PA-C [facility] [phone]  Clinic MD:  Darrick Penna

## 2019-08-23 ENCOUNTER — Encounter
Payer: Medicaid Other | Attending: Physical Medicine and Rehabilitation | Admitting: Physical Medicine and Rehabilitation

## 2019-08-23 ENCOUNTER — Encounter: Payer: Self-pay | Admitting: Physical Medicine and Rehabilitation

## 2019-08-23 ENCOUNTER — Other Ambulatory Visit: Payer: Self-pay

## 2019-08-23 VITALS — BP 166/112 | HR 74 | Temp 97.9°F | Ht 66.0 in | Wt 169.0 lb

## 2019-08-23 DIAGNOSIS — H538 Other visual disturbances: Secondary | ICD-10-CM | POA: Diagnosis not present

## 2019-08-23 DIAGNOSIS — E1165 Type 2 diabetes mellitus with hyperglycemia: Secondary | ICD-10-CM | POA: Insufficient documentation

## 2019-08-23 DIAGNOSIS — G8191 Hemiplegia, unspecified affecting right dominant side: Secondary | ICD-10-CM | POA: Diagnosis present

## 2019-08-23 DIAGNOSIS — Z7409 Other reduced mobility: Secondary | ICD-10-CM | POA: Diagnosis not present

## 2019-08-23 DIAGNOSIS — Z9989 Dependence on other enabling machines and devices: Secondary | ICD-10-CM | POA: Diagnosis present

## 2019-08-23 DIAGNOSIS — I6340 Cerebral infarction due to embolism of unspecified cerebral artery: Secondary | ICD-10-CM | POA: Insufficient documentation

## 2019-08-23 DIAGNOSIS — S98119A Complete traumatic amputation of unspecified great toe, initial encounter: Secondary | ICD-10-CM | POA: Diagnosis not present

## 2019-08-23 DIAGNOSIS — R000 Tachycardia, unspecified: Secondary | ICD-10-CM | POA: Diagnosis present

## 2019-08-23 DIAGNOSIS — I96 Gangrene, not elsewhere classified: Secondary | ICD-10-CM | POA: Diagnosis not present

## 2019-08-23 DIAGNOSIS — H524 Presbyopia: Secondary | ICD-10-CM | POA: Diagnosis not present

## 2019-08-23 MED ORDER — TRAMADOL HCL 50 MG PO TABS: 50.0000 mg | ORAL_TABLET | Freq: Four times a day (QID) | ORAL | 3 refills | Status: DC | PRN

## 2019-08-23 NOTE — Progress Notes (Signed)
Subjective:    Patient ID: Andrew Bruce, male    DOB: 07-01-1965, 54 y.o.   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  Here for hospital f/u.  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 Still hoarse, quiet voice Also s/p 1st/2nd toe amputation on R and 1st on L toe amputation.   Here for rehab f/u.  Pain 2-3/10- right now.  Got small Rx for Percocet, but ran out- taking tramadol now- is working.    Walking with the cane.  Amputated the 3 toes 08/13/19.   Wants me to look at amputation sites, but don't have dressings to dress them again.    A little bit better than last visit.  Wants more therapies, but was cut off since didn't get medicaid. Just got it in last few days, so   Need refill on tramadol.      Pain Inventory Average Pain 3 Pain Right Now 3 My pain is tingling  In the last 24 hours, has pain interfered with the following? General activity 3 Relation with others 3 Enjoyment of life 3 What TIME of day is your pain at its worst? varies Sleep (in general) Fair  Pain is worse with: walking and standing Pain improves with: medication Relief from Meds: 9  Mobility walk with assistance use a cane ability to climb steps?  yes do you drive?  no  Function disabled: date disabled . I need assistance with the following:  bathing, meal prep, household duties and shopping  Neuro/Psych weakness trouble walking  Prior Studies Any changes since last visit?  no  Physicians involved in your care Any changes since last visit?  no   Family History  Problem Relation Age of Onset  . Healthy Sister   . Healthy Brother    Social History   Socioeconomic History  . Marital status: Married    Spouse name: Not on file  . Number of  children: Not on file  . Years of education: Not on file  . Highest education level: Not on file  Occupational History  . Not on file  Tobacco Use  . Smoking status: Former Games developer  . Smokeless tobacco: Never Used  Vaping Use  . Vaping Use: Never used  Substance and Sexual Activity  . Alcohol use: Yes    Comment: rare to occasional  . Drug use: Never  . Sexual activity: Yes  Other Topics Concern  . Not on file  Social History Narrative  . Not on file   Social Determinants of Health   Financial Resource Strain:   . Difficulty of Paying Living Expenses:   Food Insecurity:   . Worried About Programme researcher, broadcasting/film/video in the Last Year:   . Barista in the Last Year:   Transportation Needs:   . Freight forwarder (Medical):   Marland Kitchen Lack of Transportation (Non-Medical):   Physical Activity:   . Days of Exercise per Week:   . Minutes of Exercise per Session:   Stress:   . Feeling of Stress :   Social Connections:   . Frequency of Communication with Friends and Family:   . Frequency of Social Gatherings with Friends and Family:   . Attends Religious Services:   .  Active Member of Clubs or Organizations:   . Attends Banker Meetings:   Marland Kitchen Marital Status:    Past Surgical History:  Procedure Laterality Date  . AMPUTATION Right 08/13/2019   Procedure: AMPUTATION RIGHT FIRST AND SECOND TOES;  Surgeon: Larina Earthly, MD;  Location: [facility] OR;  Service: Vascular;  Laterality: Right;  . DIRECT LARYNGOSCOPY Bilateral 04/06/2019   Procedure: MICRO DIRECT LARYNGOSCOPY WITH PROLARYN INJECTION;  Surgeon: Christia Reading, MD;  Location: [facility];  Service: ENT;  Laterality: Bilateral;  . IR GASTROSTOMY TUBE MOD SED  03/10/2019  . IR GASTROSTOMY TUBE REMOVAL  04/20/2019  . WOUND DEBRIDEMENT Left 08/13/2019   Procedure: AMPUTATION  OF LEFT FIRST TOE, AND DEBRIDEMENT OF LEFT SECOND, THIRD AND FOURTH TOES;  Surgeon: Larina Earthly, MD;  Location: [facility] OR;  Service: Vascular;  Laterality: Left;    Past Medical History:  Diagnosis Date  . COVID-19 02/06/2019  . Diabetes mellitus without complication (HCC)   . Hypertension   . 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 Pneumonia    with Covid  . Stroke [facility])    weakness on right side, occurred while he had Covid   BP (!) 166/112   Pulse 74   Temp 97.9 F (36.6 C)   Ht 5\' 6"  (1.676 m)   Wt 169 lb (76.7 kg)   SpO2 99%   BMI 27.28 kg/m   Opioid Risk Score:   Fall Risk Score:  `1  Depression screen PHQ 2/9  Depression screen [facility] 2/9 06/25/2019 06/10/2019 05/13/2019  Decreased Interest 0 0 0  Down, Depressed, Hopeless 0 0 0  PHQ - 2 Score 0 0 0    Review of Systems  Musculoskeletal: Positive for gait problem.  Neurological: Positive for weakness.  All other systems reviewed and are negative.      Objective:   Physical Exam   Awake, alert, appropriate, using tele-interpretor, using cane, NAD Wearing sandals- 1st toes amputated and 2nd toe on R- has them dressed with kerlex- and ACE wraps- some drainage on dressing-  MS: HF 4+/5 B/L, KE and KF 4+/5 B/L DF- and PF 4-/5- due to pain?  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  Here for hospital f/u.  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 Still hoarse, quiet voice Also s/p 1st/2nd toe amputation on R and 1st on L toe amputation.   1. Restart PT and OT- placed referrals  2. Refill Tramadol 1-2 tabs q6 hours prn- #180 with 3 RFs  3. Foot amputations per surgeon.    4. Needs to  progress with therapy more to be functional.    5. F/U in 2 months  I spent a total of 25 minutes on visit- due to language barrier.

## 2019-08-23 NOTE — Patient Instructions (Signed)
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  Here for hospital f/u.  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 Still hoarse, quiet voice Also s/p 1st/2nd toe amputation on R and 1st on L toe amputation.   1. Restart PT and OT- placed referrals  2. Refill Tramadol 1-2 tabs q6 hours prn- #180 with 3 RFs  3. Foot amputations per surgeon.    4. Needs to progress with therapy more to be functional.    5. F/U in 2 months

## 2019-08-24 ENCOUNTER — Other Ambulatory Visit (HOSPITAL_COMMUNITY): Payer: Medicaid Other

## 2019-08-24 DIAGNOSIS — H5213 Myopia, bilateral: Secondary | ICD-10-CM | POA: Diagnosis not present

## 2019-08-26 ENCOUNTER — Ambulatory Visit: Payer: Medicaid Other

## 2019-08-31 ENCOUNTER — Ambulatory Visit: Payer: Medicaid Other

## 2019-09-01 ENCOUNTER — Ambulatory Visit (INDEPENDENT_AMBULATORY_CARE_PROVIDER_SITE_OTHER): Payer: Medicaid Other | Admitting: Physician Assistant

## 2019-09-01 ENCOUNTER — Other Ambulatory Visit: Payer: Self-pay

## 2019-09-01 ENCOUNTER — Telehealth: Payer: Self-pay | Admitting: *Deleted

## 2019-09-01 VITALS — BP 161/103 | HR 90 | Temp 98.4°F | Resp 20 | Ht 66.0 in | Wt 170.4 lb

## 2019-09-01 DIAGNOSIS — I96 Gangrene, not elsewhere classified: Secondary | ICD-10-CM

## 2019-09-01 NOTE — Telephone Encounter (Signed)
Prior auth approved for tramadol through 02/27/2020

## 2019-09-01 NOTE — Progress Notes (Signed)
  POST OPERATIVE OFFICE NOTE    CC:  F/u for surgery  HPI:  This is a 54 y.o. male who is s/p amputation of left first, second, third, and fourth toes and right first and second toes on 08/13/2019.  He denies pain, fever or chills. His wifes accompanies him and keeps his feet washed and dressed.  The patient has not yet looked at his feet since the amputations.  No Known Allergies  Current Outpatient Medications  Medication Sig Dispense Refill  . acetaminophen (TYLENOL) 325 MG tablet Take 1-2 tablets (325-650 mg total) by mouth every 4 (four) hours as needed for mild pain. 100 tablet 0  . apixaban (ELIQUIS) 5 MG TABS tablet Take 1 tablet (5 mg total) by mouth 2 (two) times daily. 60 tablet 1  . atorvastatin (LIPITOR) 20 MG tablet Take 1 tablet (20 mg total) by mouth daily at 6 PM. 30 tablet 0  . blood glucose meter kit and supplies KIT Dispense based on patient and insurance preference. Use up to four times daily as directed. (FOR ICD-9 250.00, 250.01). 1 each 0  . doxazosin (CARDURA) 1 MG tablet Take 1 tablet (1 mg total) by mouth at bedtime. 30 tablet 0  . famotidine (PEPCID) 20 MG tablet Take 1 tablet (20 mg total) by mouth 2 (two) times daily. 60 tablet 1  . glipiZIDE (GLUCOTROL) 5 MG tablet Take 0.5 tablets (2.5 mg total) by mouth daily before breakfast. 30 tablet 0  . iron polysaccharides (NIFEREX) 150 MG capsule Take 1 capsule (150 mg total) by mouth 2 (two) times daily. 60 capsule 0  . metFORMIN (GLUCOPHAGE) 500 MG tablet Take 1 tablet (500 mg total) by mouth 2 (two) times daily with a meal. 60 tablet 0  . metoprolol tartrate (LOPRESSOR) 25 MG tablet Take 0.5 tablets (12.5 mg total) by mouth 2 (two) times daily. 30 tablet 0  . oxyCODONE-acetaminophen (PERCOCET) 5-325 MG tablet Take 1 tablet by mouth every 4 (four) hours as needed for severe pain. 20 tablet 0  . potassium chloride (KLOR-CON) 10 MEQ tablet Take 1 tablet (10 mEq total) by mouth daily. 30 tablet 0  . traMADol (ULTRAM) 50  MG tablet Take 1-2 tablets (50-100 mg total) by mouth every 6 (six) hours as needed for moderate pain or severe pain. 180 tablet 3  . traZODone (DESYREL) 50 MG tablet Take 0.5 tablets (25 mg total) by mouth at bedtime as needed for sleep. 15 tablet 0   No current facility-administered medications for this visit.     ROS:  See HPI  Vitals:   09/01/19 1544  BP: (!) 161/103  Pulse: 90  Resp: 20  Temp: 98.4 F (36.9 C)  SpO2: 97%   Physical Exam:  General appearance: WD, WN in NAD Cardiac:RRR Respiratory:nonlabored Incision:  Healing without signs of infection. Sensation intact. Extremities:  2+ pedal pulses bilaterally Neuro: A and O times 4   Assessment/Plan:  This is a 54 y.o. male who is s/p: amputation of left first second third and fourth toes and right first and second toes. Approx. 1 cm granulating area on dorsum of left 1st toe. 2 and 1/2 weeks post-op.  Sutures removed. Rx given for orthosis but instructed his wife to wait another 3-4 weeks for toes to fully heal  Follow-up again in 3 weeks  Risa Grill, PA-C [facility] [phone]  Clinic MD:  Scot Dock

## 2019-09-03 ENCOUNTER — Ambulatory Visit: Payer: Medicaid Other | Attending: Physical Medicine and Rehabilitation

## 2019-09-03 ENCOUNTER — Other Ambulatory Visit: Payer: Self-pay

## 2019-09-03 DIAGNOSIS — M6281 Muscle weakness (generalized): Secondary | ICD-10-CM | POA: Diagnosis present

## 2019-09-03 DIAGNOSIS — R209 Unspecified disturbances of skin sensation: Secondary | ICD-10-CM | POA: Insufficient documentation

## 2019-09-03 DIAGNOSIS — I69351 Hemiplegia and hemiparesis following cerebral infarction affecting right dominant side: Secondary | ICD-10-CM | POA: Diagnosis present

## 2019-09-03 DIAGNOSIS — R2689 Other abnormalities of gait and mobility: Secondary | ICD-10-CM | POA: Insufficient documentation

## 2019-09-03 DIAGNOSIS — R2681 Unsteadiness on feet: Secondary | ICD-10-CM | POA: Insufficient documentation

## 2019-09-03 DIAGNOSIS — R278 Other lack of coordination: Secondary | ICD-10-CM | POA: Insufficient documentation

## 2019-09-03 NOTE — Therapy (Signed)
[facility] [address] [place], [place], [address] Phone: [phone]   Fax:  [phone]  Physical Therapy Evaluation  Patient Details  Name: Andrew Bruce MRN: [id number] Date of Birth: Dec 19, 1965 Referring Provider (PT): Genice Rouge, MD   Encounter Date: 09/03/2019   PT End of Session - 09/03/19 1527    Visit Number 1    Number of Visits 9    Date for PT Re-Evaluation 12/02/19   POC for 8 weeks, Cert for 90 days   Authorization Type [employer] (27 visit limit)    Authorization Time Period no auth required currently    PT Start Time 1425    PT Stop Time 1515    PT Time Calculation (min) 50 min    Equipment Utilized During Treatment Gait belt    Activity Tolerance Patient tolerated treatment well    Behavior During Therapy WFL for tasks assessed/performed   Tearful          Past Medical History:  Diagnosis Date  . COVID-19 02/06/2019  . Diabetes mellitus without complication (HCC)   . Hypertension   . 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 Pneumonia    with Covid  . Stroke [facility])    weakness on right side, occurred while he had Covid    Past Surgical History:  Procedure Laterality Date  . AMPUTATION Right 08/13/2019   Procedure: AMPUTATION RIGHT FIRST AND SECOND TOES;  Surgeon: Larina Earthly, MD;  Location: [facility] OR;  Service: Vascular;  Laterality: Right;  . DIRECT LARYNGOSCOPY Bilateral 04/06/2019   Procedure: MICRO DIRECT LARYNGOSCOPY WITH PROLARYN INJECTION;  Surgeon: Christia Reading, MD;  Location: [facility] OR;  Service: ENT;  Laterality: Bilateral;  . IR GASTROSTOMY TUBE MOD SED  03/10/2019  . IR GASTROSTOMY TUBE REMOVAL  04/20/2019  . WOUND DEBRIDEMENT Left 08/13/2019   Procedure: AMPUTATION  OF LEFT FIRST TOE, AND DEBRIDEMENT OF LEFT SECOND, THIRD AND FOURTH TOES;  Surgeon: Larina Earthly, MD;  Location: [facility] OR;  Service: Vascular;  Laterality:  Left;    There were no vitals filed for this visit.    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 Patient reports increased fatigue in the legs and arms. Patient reports that the wounds are healing okay. Sutures were removed two days. Patient is weight bearing as tolerated. Reports he is changing the dressing on the wounds three times a week. Patient reports he was using a cane but did not bring it to session today, wants to get rid of it. Is going to get special shoes through [facility]    Patient is accompained by: --   Video Interpreter   Pertinent History COVID infection, hx of CVA (January 2021), hx of PE, gangerene of toes    Limitations Standing;Walking    Currently in Pain? 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 Has appt with [facility] scheduled      Balance Screen   Has the patient fallen in the past 6 months No    Has the patient had a decrease in activity level because of a fear of falling?  Yes    Is the patient reluctant to leave their home because of a fear of falling?  Yes      Home Environment   Living  Environment Private residence    Living Arrangements Spouse/significant other;Children    Available Help at Discharge Family    Type of Home House    Home Access Stairs to enter    Entrance Stairs-Number of Steps 2    Entrance Stairs-Rails Left;Right;Can reach both    Home Layout Two level    Alternate Level Stairs-Number of Steps 8    Alternate Level Stairs-Rails Right    Home Equipment Walker - 2 wheels;Cane - single point      Prior Function   Level of Independence Independent    Vocation Full time employment      Cognition   Overall Cognitive Status Within Functional Limits for tasks assessed      Observation/Other Assessments   Observations Patient tearful and reports no courage to look at feet at this time. Reports that had sutures removed and reports that was healing good. Patient's wife continues to assess area daily and complete dressing changes.       Sensation   Light Touch Appears Intact    Additional Comments for BLE's. Patient reports pain to touch on top of BLE's. Patient also report numbness on index and thumb of right hand      Coordination   Gross Motor Movements are Fluid and Coordinated Yes      ROM / Strength   AROM / PROM / Strength Strength      Strength   Overall Strength Comments 3/5 on Bilateral DF, no formal pressure given due to pain reports       Transfers   Transfers Sit to Stand;Stand to Sit    Sit to Stand 5: Supervision    Stand to Sit 5: Supervision    Comments Pt completed sit <> stand with UE from chair, supervision for safety      Ambulation/Gait   Ambulation/Gait Yes    Ambulation/Gait Assistance 5: Supervision    Ambulation/Gait Assistance Details supervision for safety    Ambulation Distance (Feet) 115 Feet    Assistive device None    Gait Pattern Wide base of support;Poor foot clearance - left;Antalgic;Decreased step length - right;Decreased step length - left    Ambulation Surface Level;Indoor    Gait Comments patient  ambulating with short steps, antalgic gait pattern due to pain in B feet.      Standardized Balance Assessment   Standardized Balance Assessment Berg Balance Test      Berg Balance Test   Sit to Stand Able to stand without using hands and stabilize independently  Standing Unsupported Able to stand safely 2 minutes    Sitting with Back Unsupported but Feet Supported on Floor or Stool Able to sit safely and securely 2 minutes    Stand to Sit Sits safely with minimal use of hands    Transfers Able to transfer safely, minor use of hands    Standing Unsupported with Eyes Closed Able to stand 10 seconds safely    Standing Unsupported with Feet Together Able to place feet together independently and stand for 1 minute with supervision    From Standing, Reach Forward with Outstretched Arm Can reach forward >12 cm safely (5")    From Standing Position, Pick up Object from Floor Able to pick up shoe, needs supervision    From Standing Position, Turn to Look Behind Over each Shoulder Turn sideways only but maintains balance    Turn 360 Degrees Able to turn 360 degrees safely but slowly    Standing Unsupported, Alternately Place Feet on Step/Stool Able to stand independently and complete 8 steps >20 seconds    Standing Unsupported, One Foot in Front Able to take small step independently and hold 30 seconds    Standing on One Leg Able to lift leg independently and hold 5-10 seconds    Total Score 45    Berg comment: 45/56 = Signficant fall risk. has decreased since discharge and having more difficulty with balance challenge due to amputation of B 1st toe                      Objective measurements completed on examination: See above findings.               PT Education - 09/03/19 1525    Education Details Educated on POC and Evaluation Findings    Person(s) Educated Patient    Methods Explanation    Comprehension Verbalized understanding            PT Short Term Goals  - 09/03/19 1535      PT SHORT TERM GOAL #1   Title Patient will be independent with initial HEP for Balance/Strengthening (All STG due on 4th Visit)    Baseline no HEP established    Time 4    Period Weeks    Status New    Target Date 10/01/19   after 4 visits     PT SHORT TERM GOAL #2   Title Patient will report understanding of fall prevention strategies    Baseline dependent    Time 4    Period Weeks    Status New      PT SHORT TERM GOAL #3   Title Patient will demo ability to ambulate >500 ft on indoor surfaces, Mod I with no AD for improved ambulation    Baseline 115 ft supervision    Time 4    Period Weeks    Status New             PT Long Term Goals - 09/03/19 1537      PT LONG TERM GOAL #1   Title Patient will be independent with Final HEP for Balance/Strengthening (All LTG due after 8th Visit)    Baseline no HEP established    Time 8    Period Weeks    Status New    Target Date 10/29/19      PT LONG TERM GOAL #2   Title Patient will improve Berg Balance to >/= 52/56 to demonstrate reduced fall risk and improved  balance    Baseline 45/56    Time 8    Period Weeks    Status New      PT LONG TERM GOAL #3   Title Patient will demo ability to ambulate >1000 ft on indoor/outdoor surfaces with no AD at Mod I    Baseline 115 supervision    Time 8    Period Weeks    Status New      PT LONG TERM GOAL #4   Title Patient will demo ability to ascend/descend 12 stairs at Mod I level for improved ambulation within home    Baseline no baseline established    Time 8    Period Weeks    Status New      PT LONG TERM GOAL #5   Title Patient will report being able to stand for >10 minutes with no increase in pain in bilateral feet    Baseline 3/10 pain with standing    Time 8    Period Weeks    Status New                  Plan - 09/03/19 1532    Clinical Impression Statement Patient is a 54 y.o. 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 Patient also has residual mild hemiparesis due to CVA that occurred in January 2021. Patient is known to this clinic for prior PT/OT services. Patient is currently weight bearing as tolerated, with reports wounds are healing well, recently had sutures removed on 8/18. Patient presents with decreased balance, abnormal gait, and increased pain since discharge at end of prior POC. At discharge patient scored a 56/56 on Berg Balance, and today scored a 45/56 demonstrating increased fall risk. Patient is ambulating without an AD at this time but does demonstrate antalgic pattern due to pain in B feet. Patient will benefit from skilled PT services to address deficits, reduce fall risk, and maximize functional mobility.    Personal Factors and Comorbidities Comorbidity 2;Time since onset of injury/illness/exacerbation;Transportation    Comorbidities Hx of CVA, Gangerene of toes, COVID - 19    Examination-Activity Limitations Squat;Stairs;Stand;Locomotion Level    Examination-Participation Restrictions Community Activity;Driving;Yard Work;Shop    Stability/Clinical Decision Making Evolving/Moderate complexity    Clinical Decision Making Moderate    Rehab Potential Fair    PT Frequency 1x / week    PT Duration 8 weeks    PT Treatment/Interventions ADLs/Self Care Home Management;Gait training;Stair training;Functional mobility training;Therapeutic exercise;Therapeutic activities;Balance training;Patient/family education;Orthotic Fit/Training;Passive range of motion;Manual techniques;Energy conservation;Visual/perceptual remediation/compensation;Joint Manipulations;DME Instruction;Neuromuscular re-education    PT Next Visit Plan Continue Gait Training, Educate on Fall Prevention, Initiate Balance HEP    Consulted and Agree with Plan of Care Patient           Patient will  benefit from skilled therapeutic intervention in order to improve the following deficits and impairments:  Abnormal gait, Decreased balance, Decreased mobility, Decreased endurance, Decreased range of motion, Decreased strength, Difficulty walking, Impaired sensation, Pain, Decreased activity tolerance, Decreased knowledge of use of DME  Visit Diagnosis: Unsteadiness on feet  Other abnormalities of gait and mobility  Muscle weakness (generalized)     Problem List Patient Active Problem List   Diagnosis Date Noted  . Impaired functional mobility, balance, gait, and endurance 08/23/2019  . Amputation of great toe (HCC) 08/23/2019  . 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 Impaired ambulation 06/11/2019  . Dependent on walker for ambulation 04/30/2019  . Right hemiparesis (HCC) 04/30/2019  . Tachycardia 04/30/2019  . Acute blood loss anemia   . Uncontrolled type 2 diabetes mellitus with hyperglycemia (HCC)   . Dry gangrene (HCC)   . Leukocytosis   . 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 Status post tracheostomy (HCC)   . Pressure injury of skin 03/02/2019  . On mechanically assisted ventilation (HCC)   . Goals of care, counseling/discussion   . Palliative care encounter   . ARDS (adult respiratory distress syndrome) (HCC)   . Cerebral embolism with cerebral infarction 02/17/2019  . Acute respiratory failure with hypoxia (HCC)   . Pneumothorax, right     Tempie Donning, PT, DPT 09/03/2019, 3:43 PM  [facility] [address] [place], [place], [address] Phone: [phone]   Fax:  [phone]  Name: Gaetano Romberger MRN: [id number] Date of Birth: [birth year]

## 2019-09-07 ENCOUNTER — Encounter: Payer: Medicaid Other | Admitting: Speech Pathology

## 2019-09-07 ENCOUNTER — Ambulatory Visit: Payer: Medicaid Other

## 2019-09-09 ENCOUNTER — Other Ambulatory Visit: Payer: Self-pay

## 2019-09-09 ENCOUNTER — Encounter: Payer: Self-pay | Admitting: Occupational Therapy

## 2019-09-09 ENCOUNTER — Ambulatory Visit: Payer: Medicaid Other

## 2019-09-09 ENCOUNTER — Ambulatory Visit: Payer: Medicaid Other | Admitting: Occupational Therapy

## 2019-09-09 DIAGNOSIS — R2681 Unsteadiness on feet: Secondary | ICD-10-CM | POA: Diagnosis not present

## 2019-09-09 DIAGNOSIS — R208 Other disturbances of skin sensation: Secondary | ICD-10-CM

## 2019-09-09 DIAGNOSIS — M6281 Muscle weakness (generalized): Secondary | ICD-10-CM

## 2019-09-09 DIAGNOSIS — R278 Other lack of coordination: Secondary | ICD-10-CM

## 2019-09-09 DIAGNOSIS — I69351 Hemiplegia and hemiparesis following cerebral infarction affecting right dominant side: Secondary | ICD-10-CM

## 2019-09-09 DIAGNOSIS — R2689 Other abnormalities of gait and mobility: Secondary | ICD-10-CM

## 2019-09-09 NOTE — Therapy (Signed)
[facility] [address] [place], [place], [address] Phone: [phone]   Fax:  [phone]  Physical Therapy Treatment  Patient Details  Name: Andrew Bruce MRN: [id number] Date of Birth: 26-Mar-1965 Referring Provider (PT): Genice Rouge, MD   Encounter Date: 09/09/2019   PT End of Session - 09/09/19 0847    Visit Number 2    Number of Visits 9    Date for PT Re-Evaluation 12/02/19   POC for 8 weeks, Cert for 90 days   Authorization Type UHC Medicaid (27 visit limit)    Authorization Time Period no auth required currently    PT Start Time 0845    PT Stop Time 0928    PT Time Calculation (min) 43 min    Equipment Utilized During Treatment Gait belt    Activity Tolerance Patient tolerated treatment well    Behavior During Therapy WFL for tasks assessed/performed   Tearful          Past Medical History:  Diagnosis Date  . COVID-19 02/06/2019  . Diabetes mellitus without complication (HCC)   . Hypertension   . 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 Pneumonia    with Covid  . Stroke [facility])    weakness on right side, occurred while he had Covid    Past Surgical History:  Procedure Laterality Date  . AMPUTATION Right 08/13/2019   Procedure: AMPUTATION RIGHT FIRST AND SECOND TOES;  Surgeon: Larina Earthly, MD;  Location: [facility] OR;  Service: Vascular;  Laterality: Right;  . DIRECT LARYNGOSCOPY Bilateral 04/06/2019   Procedure: MICRO DIRECT LARYNGOSCOPY WITH PROLARYN INJECTION;  Surgeon: Christia Reading, MD;  Location: [facility] OR;  Service: ENT;  Laterality: Bilateral;  . IR GASTROSTOMY TUBE MOD SED  03/10/2019  . IR GASTROSTOMY TUBE REMOVAL  04/20/2019  . WOUND DEBRIDEMENT Left 08/13/2019   Procedure: AMPUTATION  OF LEFT FIRST TOE, AND DEBRIDEMENT OF LEFT SECOND, THIRD AND FOURTH TOES;  Surgeon: Larina Earthly, MD;  Location: [facility] OR;  Service: Vascular;  Laterality:  Left;    There were no vitals filed for this visit.   Subjective Assessment - 09/09/19 0847    Subjective Pt reports that his new custom shoes will be coming 09/23/19. Reports still has pain in feet mostly when up on them.    Patient is accompained by: Interpreter   in person interpreter, Ana   Pertinent History COVID infection, hx of CVA (January 2021), hx of PE, gangerene of toes    Limitations Standing;Walking    Currently in Pain? Yes    Pain Score 4     Pain Location Foot   and lower legs   Pain Orientation Right;Left    Pain Descriptors / Indicators Aching    Pain Type Acute pain    Pain Onset 1 to 4 weeks ago    Pain Frequency Intermittent    Aggravating Factors  being up on feet    Pain Relieving Factors tylenol                             [facility] Adult PT Treatment/Exercise - 09/09/19 0849      Ambulation/Gait   Ambulation/Gait Yes    Ambulation/Gait Assistance 5: Supervision    Ambulation/Gait Assistance Details Pt has decreased toe off bilateral due to amputations. Does report feels least steady on right foot  Ambulation Distance (Feet) 230 Feet    Assistive device None    Gait Pattern Step-through pattern;Decreased step length - right;Decreased step length - left;Antalgic    Ambulation Surface Level;Indoor      Neuro Re-ed    Neuro Re-ed Details  Balance exercises in corner: Standing on pillows feet apart with eye open x 30 sec, eyes closed x 30 sec, head turns left/right and up/down x 10 each then repeated with feet together. Standing on rockerboard positioned ant/post trying to maintain level x 30 sec then added in rocking board x 15 with less shift forward. Then on rockerboard eyes closed 30 sec x 2 with increased sway CGA. Tandem stance 30 sec x 2 each position. Along counter: side stepping 6' x 6 without UE support, marching gait 6' x 6 with close SBA.                  PT Education - 09/09/19 1320    Education Details Started on balance  HEP    Person(s) Educated Patient    Methods Explanation;Demonstration;Handout    Comprehension Verbalized understanding;Returned demonstration            PT Short Term Goals - 09/03/19 1535      PT SHORT TERM GOAL #1   Title Patient will be independent with initial HEP for Balance/Strengthening (All STG due on 4th Visit)    Baseline no HEP established    Time 4    Period Weeks    Status New    Target Date 10/01/19   after 4 visits     PT SHORT TERM GOAL #2   Title Patient will report understanding of fall prevention strategies    Baseline dependent    Time 4    Period Weeks    Status New      PT SHORT TERM GOAL #3   Title Patient will demo ability to ambulate >500 ft on indoor surfaces, Mod I with no AD for improved ambulation    Baseline 115 ft supervision    Time 4    Period Weeks    Status New             PT Long Term Goals - 09/03/19 1537      PT LONG TERM GOAL #1   Title Patient will be independent with Final HEP for Balance/Strengthening (All LTG due after 8th Visit)    Baseline no HEP established    Time 8    Period Weeks    Status New    Target Date 10/29/19      PT LONG TERM GOAL #2   Title Patient will improve Berg Balance to >/= 52/56 to demonstrate reduced fall risk and improved balance    Baseline 45/56    Time 8    Period Weeks    Status New      PT LONG TERM GOAL #3   Title Patient will demo ability to ambulate >1000 ft on indoor/outdoor surfaces with no AD at Mod I    Baseline 115 supervision    Time 8    Period Weeks    Status New      PT LONG TERM GOAL #4   Title Patient will demo ability to ascend/descend 12 stairs at Mod I level for improved ambulation within home    Baseline no baseline established    Time 8    Period Weeks    Status New      PT LONG TERM GOAL #  5   Title Patient will report being able to stand for >10 minutes with no increase in pain in bilateral feet    Baseline 3/10 pain with standing    Time 8    Period  Weeks    Status New                 Plan - 09/09/19 1321    Clinical Impression Statement PT started pt on balance exercises at visit today. Pt was challenged with eyes closed on compliant surface. Worked on ankle strategies on compliant surfaces to also add in some strengthening. Pt took a couple seated rest breaks to rest feet due to some pain.    Personal Factors and Comorbidities Comorbidity 2;Time since onset of injury/illness/exacerbation;Transportation    Comorbidities Hx of CVA, Gangerene of toes, COVID - 19    Examination-Activity Limitations Squat;Stairs;Stand;Locomotion Level    Examination-Participation Restrictions Community Activity;Driving;Yard Work;Shop    Stability/Clinical Decision Making Evolving/Moderate complexity    Rehab Potential Fair    PT Frequency 1x / week    PT Duration 8 weeks    PT Treatment/Interventions ADLs/Self Care Home Management;Gait training;Stair training;Functional mobility training;Therapeutic exercise;Therapeutic activities;Balance training;Patient/family education;Orthotic Fit/Training;Passive range of motion;Manual techniques;Energy conservation;Visual/perceptual remediation/compensation;Joint Manipulations;DME Instruction;Neuromuscular re-education    PT Next Visit Plan Continue Gait Training, Educate on Fall Prevention, How are initial baalnce exercises going? Continue to progress balance. Try wall bumps for ankle strategy.    Consulted and Agree with Plan of Care Patient           Patient will benefit from skilled therapeutic intervention in order to improve the following deficits and impairments:  Abnormal gait, Decreased balance, Decreased mobility, Decreased endurance, Decreased range of motion, Decreased strength, Difficulty walking, Impaired sensation, Pain, Decreased activity tolerance, Decreased knowledge of use of DME  Visit Diagnosis: Other abnormalities of gait and mobility  Muscle weakness (generalized)  Unsteadiness on  feet     Problem List Patient Active Problem List   Diagnosis Date Noted  . Impaired functional mobility, balance, gait, and endurance 08/23/2019  . Amputation of great toe (HCC) 08/23/2019  . 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 Impaired ambulation 06/11/2019  . Dependent on walker for ambulation 04/30/2019  . Right hemiparesis (HCC) 04/30/2019  . Tachycardia 04/30/2019  . Acute blood loss anemia   . Uncontrolled type 2 diabetes mellitus with hyperglycemia (HCC)   . Dry gangrene (HCC)   . Leukocytosis   . 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 Status post tracheostomy (HCC)   . Pressure injury of skin 03/02/2019  . On mechanically assisted ventilation (HCC)   . Goals of care, counseling/discussion   . Palliative care encounter   . ARDS (adult respiratory distress syndrome) (HCC)   . Cerebral embolism with cerebral infarction 02/17/2019  . Acute respiratory failure with hypoxia (HCC)   . Pneumothorax, right     Ronn Melena, PT, DPT, NCS 09/09/2019, 1:25 PM  [facility] [address] [place], [place], [address] Phone: [phone]   Fax:  [phone]  Name: Duard Spiewak MRN: [id number] Date of Birth: Feb 21, 1965

## 2019-09-09 NOTE — Therapy (Addendum)
[facility] [address] [place], [place], [address] Phone: [phone]   Fax:  [phone]  Occupational Therapy Evaluation  Patient Details  Name: Andrew Bruce MRN: [id number] Date of Birth: 1965-08-11 Referring Provider (OT): Dr. Genice Rouge   Encounter Date: 09/09/2019   OT End of Session - 09/09/19 1424    Visit Number 1    Number of Visits 9    Date for OT Re-Evaluation 11/08/19    Authorization Type UHC Medicaid, 27 visit limit combined (used 4 previously)    Authorization - Visit Number 1    Authorization - Number of Visits 8    OT Start Time 0805    OT Stop Time 0845    OT Time Calculation (min) 40 min    Activity Tolerance Patient tolerated treatment well    Behavior During Therapy [facility] for tasks assessed/performed           Past Medical History:  Diagnosis Date  . COVID-19 02/06/2019  . Diabetes mellitus without complication (HCC)   . Hypertension   . 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 Pneumonia    with Covid  . Stroke [facility])    weakness on right side, occurred while he had Covid    Past Surgical History:  Procedure Laterality Date  . AMPUTATION Right 08/13/2019   Procedure: AMPUTATION RIGHT FIRST AND SECOND TOES;  Surgeon: Larina Earthly, MD;  Location: [facility] OR;  Service: Vascular;  Laterality: Right;  . DIRECT LARYNGOSCOPY Bilateral 04/06/2019   Procedure: MICRO DIRECT LARYNGOSCOPY WITH PROLARYN INJECTION;  Surgeon: Christia Reading, MD;  Location: [facility] OR;  Service: ENT;  Laterality: Bilateral;  . IR GASTROSTOMY TUBE MOD SED  03/10/2019  . IR GASTROSTOMY TUBE REMOVAL  04/20/2019  . WOUND DEBRIDEMENT Left 08/13/2019   Procedure: AMPUTATION  OF LEFT FIRST TOE, AND DEBRIDEMENT OF LEFT SECOND, THIRD AND FOURTH TOES;  Surgeon: Larina Earthly, MD;  Location: [facility] OR;  Service: Vascular;  Laterality: Left;    There were no vitals filed for this  visit.   Subjective Assessment - 09/09/19 0812    Subjective  no shoulder pain today    Patient is accompanied by: Interpreter    Pertinent History CVA w/ mild Rt hemiparesis secondary to COVID and ARF Jan 2021. Pt intubated 02/07/19 and trachecd 03/02/19 - ICU complicated by PE and multiple CVA'S, gangrene of toes Lt 1-4 and Rt 1, 2, 3, 5 s/p amputation of R 1,2 toes and L 1st toe    Limitations fall risk    Patient Stated Goals be able to write better, walk better, reach higher    Currently in Pain? Yes    Pain Score 4     Pain Location Leg   lower proximal legs   Pain Orientation Right;Left    Pain Descriptors / Indicators Aching    Pain Type Acute pain    Pain Onset 1 to 4 weeks ago    Pain Frequency Intermittent    Aggravating Factors  walking a lot    Pain Relieving Factors tylenol    Effect of Pain on Daily Activities OT will not directly address due to location, but will monitor as relates to treatment             [facility] OT Assessment - 09/09/19 0001      Assessment   Medical Diagnosis CVA, long Covid    Referring  Provider (OT) Dr. Genice Rouge    Onset Date/Surgical Date 01/26/19    Hand Dominance Right    Prior Therapy Known to This Clinic for Prior PT/OT      Precautions   Precautions Fall      Restrictions   Weight Bearing Restrictions Yes    RLE Weight Bearing Weight bearing as tolerated    LLE Weight Bearing Weight bearing as tolerated      Balance Screen   Has the patient fallen in the past 6 months No      Home  Environment   Family/patient expects to be discharged to: Private residence      Prior Function   Level of Independence Independent    Vocation Full time employment    Leisure yardwork      ADL   ADL comments performing BADLs mod I      IADL   Prior Level of Function Light Housekeeping now Mod I slowly in the home, not yet returned to yardwork    Prior Level of Function Meal Prep performing simple meal prep mod I, needs A to chop/cut and  difficulty opening packages/containers, unable to llift heavier pots/pans    Meal Prep --   mod I   Community Mobility Relies on family or friends for transportation      Mobility   Mobility Status --   ambulating without device   Mobility Status Comments see PT eval for details      Written Expression   Dominant Hand Right    Handwriting 100% legible   continued difficulty writing per pt report     Vision - History   Additional Comments Pt reports that he is getting new glasses next week       Activity Tolerance   Activity Tolerance Comments pt reports 15-20 mins of activity at home prior to rest break needed      Cognition   Overall Cognitive Status Within Functional Limits for tasks assessed   will assess in functional context prn     Sensation   Light Touch Impaired by gross assessment    Additional Comments Pt reports numbness in R thumb/2nd digit and L 5th digit      Coordination   Right 9 Hole Peg Test 28.81    Left 9 Hole Peg Test 27.28      ROM / Strength   AROM / PROM / Strength AROM;Strength      AROM   Overall AROM Comments BUE grossly WFL except R shoulder flex/abduction 115*, ER grossly WFL, approx 50% IR      Strength   Overall Strength Deficits    Overall Strength Comments LUE proximal strength 5/5, RUE proximal strength grossly 4/5      Hand Function   Right Hand Grip (lbs) 36.3    Right Hand Lateral Pinch 9 lbs    Right Hand 3 Point Pinch 5 lbs    Left Hand Grip (lbs) 56.6    Left Hand Lateral Pinch 16 lbs    Left 3 point pinch 13 lbs                             OT Short Term Goals - 09/09/19 1434      OT SHORT TERM GOAL #1   Title Pt will be independent with updated HEP for RUE strength and coordination.--check STGs 10/09/19    Baseline needs updates to previous HEP for progression  Time 4    Period Weeks    Status New      OT SHORT TERM GOAL #2   Title Pt will demo at least 120* R shoulder flex for functional reaching  without pain.    Baseline 115*    Time 4    Period Weeks    Status New      OT SHORT TERM GOAL #3   Title Pt will demo at least 40lbs R grip strength to assist with opening containers/lifting objects.    Baseline R- 36lbs, L-56.6lbs    Time 4    Period Weeks    Status New      OT SHORT TERM GOAL #4   Title Pt will improve R lateral pinch strength to at least 12lbs to assist with ADLs/opening packages.    Baseline R-9lbs, L-16lbs    Time 4    Period Weeks    Status New      OT SHORT TERM GOAL #5   Title --             OT Long Term Goals - 09/09/19 1444      OT LONG TERM GOAL #1   Title Pt wil improve RUE strength/ROM as shown by being able to retrieve/replace 3lb object on overhead shelf safely.    Baseline unable    Time 8    Period Weeks    Status New      OT LONG TERM GOAL #2   Title Pt will demo at least 125* R shoulder flex for functional reaching without pain.    Baseline 115*    Time 8    Period Weeks    Status New      OT LONG TERM GOAL #3   Title Pt will demo at least 45lbs R grip strength to assist with opening containers/gripping objects.    Baseline R-36lbs, L 56lbs    Time 8    Period Weeks    Status New      OT LONG TERM GOAL #4   Title Pt will demo at least 9lbs R tip pinch strength to assist with ADLs/holding objects for IADLs.    Baseline R-5lbs, L-13lbs    Time 8    Period Weeks    Status New      OT LONG TERM GOAL #5   Title --                 Plan - 09/09/19 1426    Clinical Impression Statement Pt is a 54 y.o. male with hx of Covid with ARF 02/04/19 with intubation and trach (both removed) with consequent CVA and R hemiparesis, PE, gangrene and subsequent bilateral toe amputations.  Pt previously had outpatient occupational therapy (last seen 07/15/19) and made good progress, but did not compete POC due to pt request to hold OT until Medicaid approval and recent bilateral toe amputations (07/17/19).  Pt returns today with desire to  continue to improve RUE strength, ROM.  Pt presents with decr strength, RUE ROM, decr sensation, decr coordination, and decr balance.  Pt would benefit from occupational therapy to address these deficits for improved RUE functional use and IADL performance.    OT Occupational Profile and History Detailed Assessment- Review of Records and additional review of physical, cognitive, psychosocial history related to current functional performance    Occupational performance deficits (Please refer to evaluation for details): ADL's;IADL's;Work;Leisure    Body Structure / Function / Physical Skills ADL;ROM;IADL;Endurance;Body mechanics;Sensation;Mobility;Flexibility;Strength;Coordination;FMC;Pain;UE functional use;Decreased  knowledge of use of DME;Dexterity    Rehab Potential Good    Clinical Decision Making Several treatment options, min-mod task modification necessary    Comorbidities Affecting Occupational Performance: Presence of comorbidities impacting occupational performance    Comorbidities impacting occupational performance description: long Covid, toe amputation    Modification or Assistance to Complete Evaluation  Min-Moderate modification of tasks or assist with assess necessary to complete eval    OT Frequency 1x / week    OT Duration 8 weeks   +eval   OT Treatment/Interventions Self-care/ADL training;Therapeutic exercise;Functional Mobility Training;Aquatic Therapy;Neuromuscular education;Manual Therapy;Energy conservation;Therapeutic activities;DME and/or AE instruction;Moist Heat;Passive range of motion;Patient/family education;Fluidtherapy    Plan initiate updated HEP for RUE    Consulted and Agree with Plan of Care Patient           Patient will benefit from skilled therapeutic intervention in order to improve the following deficits and impairments:   Body Structure / Function / Physical Skills: ADL, ROM, IADL, Endurance, Body mechanics, Sensation, Mobility, Flexibility, Strength,  Coordination, FMC, Pain, UE functional use, Decreased knowledge of use of DME, Dexterity       Visit Diagnosis: Hemiplegia and hemiparesis following cerebral infarction affecting right dominant side (HCC)  Other lack of coordination  Muscle weakness (generalized)  Other disturbances of skin sensation  Unsteadiness on feet    Problem List Patient Active Problem List   Diagnosis Date Noted  . Impaired functional mobility, balance, gait, and endurance 08/23/2019  . Amputation of great toe (HCC) 08/23/2019  . 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 Impaired ambulation 06/11/2019  . Dependent on walker for ambulation 04/30/2019  . Right hemiparesis (HCC) 04/30/2019  . Tachycardia 04/30/2019  . Acute blood loss anemia   . Uncontrolled type 2 diabetes mellitus with hyperglycemia (HCC)   . Dry gangrene (HCC)   . Leukocytosis   . 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 Status post tracheostomy (HCC)   . Pressure injury of skin 03/02/2019  . On mechanically assisted ventilation (HCC)   . Goals of care, counseling/discussion   . Palliative care encounter   . ARDS (adult respiratory distress syndrome) (HCC)   . Cerebral embolism with cerebral infarction 02/17/2019  . Acute respiratory failure with hypoxia (HCC)   . Pneumothorax, right    Check all possible CPT codes:      [x]  97110 (Therapeutic Exercise)  []  92507 (SLP Treatment)  [x]  97112 (Neuro Re-ed)   []  92526 (Swallowing Treatment)   []  97116 (Gait Training)   []  [id number] (Cognitive Training, 1st 15 minutes) [x]  97140 (Manual Therapy)   []  97130 (Cognitive Training, each add'l 15 minutes)  [x]  97530 (Therapeutic Activities)  []  Other, List CPT Code ____________    [x]  1610997535 (Self Care)       []  All codes above (97110 - 97535)  []  97012 (Mechanical Traction)  []  97014 (E-stim Unattended)  []  97032 (E-stim manual)  []  97033 (Ionto)  []  97035 (Ultrasound)  []  [id number] (Vaso)  []  97760 (Orthotic Fit) []  [id number] (Prosthetic Training) []   [id number] (Physical Performance Training) [x]  [id number] (Aquatic Therapy) []  [id number] (Canalith Repositioning) []  [id number] (Contrast Bath) []  [id number] (Paraffin) []  97597 (Wound Care 1st 20 sq cm) []  97598 (Wound Care each add'l 20 sq cm)       Manhattan Psychiatric CenterFREEMAN,Arvine Clayburn 09/09/2019, 2:51 PM  [facility] [address] [place], [place], [address] Phone: [phone]   Fax:  [phone]  Name: Andrew ShamesLeonel Bruce MRN: [id number] Date of Birth: [birth year]  Willa Frater, OTR/L [facility] [address] [place], [place]  [address] [phone] phone [phone] 09/09/19 2:51 PM

## 2019-09-09 NOTE — Patient Instructions (Signed)
Access Code: [email or web address] URL: [email or web address] Date: 09/09/2019 Prepared by: Elmer Bales  Exercises Standing on foam eyes open - 2 x daily - 7 x weekly - 1 sets - 3 reps - 30 sec hold Romberg Stance Eyes Closed on Foam Pad - 2 x daily - 7 x weekly - 1 sets - 3 reps - 30 sec hold Standing head turns - 2 x daily - 7 x weekly - 1 sets - 10 reps Tandem Stance in Corner - 2 x daily - 7 x weekly - 1 sets - 2 reps - 30 sec hold Side Stepping with Counter Support - 2 x daily - 7 x weekly - 1 sets - 3 reps Walking March - 2 x daily - 7 x weekly - 1 sets - 3 reps

## 2019-09-10 ENCOUNTER — Ambulatory Visit: Payer: Medicaid Other

## 2019-09-14 ENCOUNTER — Ambulatory Visit: Payer: Medicaid Other

## 2019-09-14 ENCOUNTER — Other Ambulatory Visit: Payer: Self-pay

## 2019-09-14 ENCOUNTER — Ambulatory Visit: Payer: Medicaid Other | Admitting: Occupational Therapy

## 2019-09-14 DIAGNOSIS — R2689 Other abnormalities of gait and mobility: Secondary | ICD-10-CM

## 2019-09-14 DIAGNOSIS — R208 Other disturbances of skin sensation: Secondary | ICD-10-CM

## 2019-09-14 DIAGNOSIS — M6281 Muscle weakness (generalized): Secondary | ICD-10-CM

## 2019-09-14 DIAGNOSIS — R278 Other lack of coordination: Secondary | ICD-10-CM

## 2019-09-14 DIAGNOSIS — R2681 Unsteadiness on feet: Secondary | ICD-10-CM | POA: Diagnosis not present

## 2019-09-14 DIAGNOSIS — I69351 Hemiplegia and hemiparesis following cerebral infarction affecting right dominant side: Secondary | ICD-10-CM

## 2019-09-14 NOTE — Therapy (Signed)
[facility] [address] [place], [place], [address] Phone: [phone]   Fax:  [phone]  Occupational Therapy Treatment  Patient Details  Name: Andrew Bruce MRN: [id number] Date of Birth: [birth year] Referring Provider (OT): Dr. Genice Rouge   Encounter Date: 09/14/2019   OT End of Session - 09/14/19 0936    Visit Number 2    Number of Visits 9    Date for OT Re-Evaluation 11/08/19    Authorization Type [employer] Medicaid, 27 visit limit combined (used 4 previously)    Authorization - Visit Number 1    Authorization - Number of Visits 8    OT Start Time 0935    OT Stop Time 1015    OT Time Calculation (min) 40 min    Activity Tolerance Patient tolerated treatment well    Behavior During Therapy [facility] for tasks assessed/performed           Past Medical History:  Diagnosis Date  . COVID-19 02/06/2019  . Diabetes mellitus without complication (HCC)   . Hypertension   . 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 Pneumonia    with Covid  . Stroke [facility])    weakness on right side, occurred while he had Covid    Past Surgical History:  Procedure Laterality Date  . AMPUTATION Right 08/13/2019   Procedure: AMPUTATION RIGHT FIRST AND SECOND TOES;  Surgeon: Larina Earthly, MD;  Location: [facility] OR;  Service: Vascular;  Laterality: Right;  . DIRECT LARYNGOSCOPY Bilateral 04/06/2019   Procedure: MICRO DIRECT LARYNGOSCOPY WITH PROLARYN INJECTION;  Surgeon: Christia Reading, MD;  Location: [facility] OR;  Service: ENT;  Laterality: Bilateral;  . IR GASTROSTOMY TUBE MOD SED  03/10/2019  . IR GASTROSTOMY TUBE REMOVAL  04/20/2019  . WOUND DEBRIDEMENT Left 08/13/2019   Procedure: AMPUTATION  OF LEFT FIRST TOE, AND DEBRIDEMENT OF LEFT SECOND, THIRD AND FOURTH TOES;  Surgeon: Larina Earthly, MD;  Location: [facility] OR;  Service: Vascular;  Laterality: Left;    There were no vitals filed for this  visit.   Subjective Assessment - 09/14/19 0935    Subjective  Pt reports pain in toes    Pertinent History CVA w/ mild Rt hemiparesis secondary to COVID and ARF Jan 2021. Pt intubated 02/07/19 and trachecd 03/02/19 - ICU complicated by PE and multiple CVA'S, gangrene of toes Lt 1-4 and Rt 1, 2, 3, 5 s/p amputation of R 1,2 toes and L 1st toe    Limitations fall risk    Patient Stated Goals be able to write better, walk better, reach higher    Currently in Pain? Yes    Pain Score 4     Pain Location Toe (Comment which one)    Pain Orientation Right;Left    Pain Descriptors / Indicators Aching    Pain Type Acute pain    Pain Onset 1 to 4 weeks ago    Aggravating Factors  standing    Pain Relieving Factors tylenol                  Treatment: Grooved pegboard with RUE for increased fine motor coordination , min-mod difficulty, min v.c to avoid compensations with RUE shoulder.              OT Education - 09/14/19 0953    Education Details shoulder/scapula HEP, coordination HEP-see pt instructions    Person(s) Educated Patient  Methods Explanation;Demonstration;Verbal cues;Handout    Comprehension Verbalized understanding;Returned demonstration;Verbal cues required            OT Short Term Goals - 09/09/19 1434      OT SHORT TERM GOAL #1   Title Pt will be independent with updated HEP for RUE strength and coordination.--check STGs 10/09/19    Baseline needs updates to previous HEP for progression    Time 4    Period Weeks    Status New      OT SHORT TERM GOAL #2   Title Pt will demo at least 120* R shoulder flex for functional reaching without pain.    Baseline 115*    Time 4    Period Weeks    Status New      OT SHORT TERM GOAL #3   Title Pt will demo at least 40lbs R grip strength to assist with opening containers/lifting objects.    Baseline R- 36lbs, L-56.6lbs    Time 4    Period Weeks    Status New      OT SHORT TERM GOAL #4   Title Pt will  improve R lateral pinch strength to at least 12lbs to assist with ADLs/opening packages.    Baseline R-9lbs, L-16lbs    Time 4    Period Weeks    Status New      OT SHORT TERM GOAL #5   Title --             OT Long Term Goals - 09/09/19 1444      OT LONG TERM GOAL #1   Title Pt wil improve RUE strength/ROM as shown by being able to retrieve/replace 3lb object on overhead shelf safely.    Baseline unable    Time 8    Period Weeks    Status New      OT LONG TERM GOAL #2   Title Pt will demo at least 125* R shoulder flex for functional reaching without pain.    Baseline 115*    Time 8    Period Weeks    Status New      OT LONG TERM GOAL #3   Title Pt will demo at least 45lbs R grip strength to assist with opening containers/gripping objects.    Baseline R-36lbs, L 56lbs    Time 8    Period Weeks    Status New      OT LONG TERM GOAL #4   Title Pt will demo at least 9lbs R tip pinch strength to assist with ADLs/holding objects for IADLs.    Baseline R-5lbs, L-13lbs    Time 8    Period Weeks    Status New      OT LONG TERM GOAL #5   Title --                 Plan - 09/14/19 0950    Clinical Impression Statement Pt is progressing towards goals. Pt demonstrates understanding of HEP.    OT Occupational Profile and History Detailed Assessment- Review of Records and additional review of physical, cognitive, psychosocial history related to current functional performance    Occupational performance deficits (Please refer to evaluation for details): ADL's;IADL's;Work;Leisure    Body Structure / Function / Physical Skills ADL;ROM;IADL;Endurance;Body mechanics;Sensation;Mobility;Flexibility;Strength;Coordination;FMC;Pain;UE functional use;Decreased knowledge of use of DME;Dexterity    Rehab Potential Good    Clinical Decision Making Several treatment options, min-mod task modification necessary    Comorbidities Affecting Occupational Performance: Presence of  comorbidities impacting occupational performance    Comorbidities impacting occupational performance description: long Covid, toe amputation    Modification or Assistance to Complete Evaluation  Min-Moderate modification of tasks or assist with assess necessary to complete eval    OT Frequency 1x / week    OT Duration 8 weeks   +eval   OT Treatment/Interventions Self-care/ADL training;Therapeutic exercise;Functional Mobility Training;Aquatic Therapy;Neuromuscular education;Manual Therapy;Energy conservation;Therapeutic activities;DME and/or AE instruction;Moist Heat;Passive range of motion;Patient/family education;Fluidtherapy    Plan NMR for RUE    Consulted and Agree with Plan of Care Patient           Patient will benefit from skilled therapeutic intervention in order to improve the following deficits and impairments:   Body Structure / Function / Physical Skills: ADL, ROM, IADL, Endurance, Body mechanics, Sensation, Mobility, Flexibility, Strength, Coordination, FMC, Pain, UE functional use, Decreased knowledge of use of DME, Dexterity       Visit Diagnosis: Muscle weakness (generalized)  Hemiplegia and hemiparesis following cerebral infarction affecting right dominant side (HCC)  Other lack of coordination  Other disturbances of skin sensation    Problem List Patient Active Problem List   Diagnosis Date Noted  . Impaired functional mobility, balance, gait, and endurance 08/23/2019  . Amputation of great toe (HCC) 08/23/2019  . 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 Impaired ambulation 06/11/2019  . Dependent on walker for ambulation 04/30/2019  . Right hemiparesis (HCC) 04/30/2019  . Tachycardia 04/30/2019  . Acute blood loss anemia   . Uncontrolled type 2 diabetes mellitus with hyperglycemia (HCC)   . Dry gangrene (HCC)   . Leukocytosis   . 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 Status post tracheostomy (HCC)   . Pressure injury of skin 03/02/2019  . On mechanically  assisted ventilation (HCC)   . Goals of care, counseling/discussion   . Palliative care encounter   . ARDS (adult respiratory distress syndrome) (HCC)   . Cerebral embolism with cerebral infarction 02/17/2019  . Acute respiratory failure with hypoxia (HCC)   . Pneumothorax, right     Yanelie Abraha 09/14/2019, 10:12 AM  [facility] [address] [place], [place], [address] Phone: [phone]   Fax:  [phone]  Name: Andrew Bruce MRN: [id number] Date of Birth: Nov 05, 1965

## 2019-09-14 NOTE — Patient Instructions (Signed)
Access Code: [email or web address] URL: [email or web address] Date: 09/14/2019 Prepared by: Elmer Bales  Exercises Standing on foam eyes open - 2 x daily - 7 x weekly - 1 sets - 3 reps - 30 sec hold Romberg Stance Eyes Closed on Foam Pad - 2 x daily - 7 x weekly - 1 sets - 3 reps - 30 sec hold Standing head turns - 2 x daily - 7 x weekly - 1 sets - 10 reps Tandem Stance in Corner - 2 x daily - 7 x weekly - 1 sets - 2 reps - 30 sec hold Side Stepping with Counter Support - 2 x daily - 7 x weekly - 1 sets - 3 reps Walking March - 2 x daily - 7 x weekly - 1 sets - 3 reps Wall bumps with ankles - 1 x daily - 7 x weekly - 2 sets - 10 reps

## 2019-09-14 NOTE — Patient Instructions (Addendum)
Cranial Flexion: Overhead Arm Extension - Supine (Medicine [employer]ies)    Lie with knees bent, arms beyond head, holding paper towel roll. Pull ball up to above face. Can repeat in seated  Repeat _10___ times per set. Do __2__ sets per session.   Scapular Retraction (Prone)    Lie with arms at sides. Pinch shoulder blades together and raise arms a few inches from floor. Repeat __10__ times per set.  Do _2___ sessions per day.  Extension - Prone (Dumbbell)    Lie with right arm hanging off side of bed. Lift hand back and up. No weight! Repeat __10__ times per set.  Do _2___ sessions per day     Coordination Activities  Perform the following activities for 20 minutes1times per day with right hand(s).   Rotate ball in fingertips (clockwise and counter-clockwise).  Toss ball between hands.  Toss ball in air and catch with the same hand.  Flip cards 1 at a time as fast as you can.  Deal cards with your thumb (Hold deck in hand and push card off top with thumb).  Pick up coins and stack.  Pick up coins one at a time until you get 5-10 in your hand, then move coins from palm to fingertips to stack one at a time.

## 2019-09-14 NOTE — Therapy (Signed)
[facility] [address] [place], [place], [address] Phone: [phone]   Fax:  [phone]  Physical Therapy Treatment  Patient Details  Name: Andrew Bruce MRN: [id number] Date of Birth: 12-Nov-1965 Referring Provider (PT): Genice Rouge, MD   Encounter Date: 09/14/2019   PT End of Session - 09/14/19 0848    Visit Number 3    Number of Visits 9    Date for PT Re-Evaluation 12/02/19   POC for 8 weeks, Cert for 90 days   Authorization Type [employer] Medicaid (27 visit limit)    Authorization Time Period no auth required currently    PT Start Time 0845    PT Stop Time 0930    PT Time Calculation (min) 45 min    Equipment Utilized During Treatment Gait belt    Activity Tolerance Patient tolerated treatment well    Behavior During Therapy WFL for tasks assessed/performed   Tearful          Past Medical History:  Diagnosis Date   COVID-19 02/06/2019   Diabetes mellitus without complication (HCC)    Hypertension    PE (pulmonary thromboembolism) (HCC)    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   Pneumonia    with Covid   Stroke (HCC)    weakness on right side, occurred while he had Covid    Past Surgical History:  Procedure Laterality Date   AMPUTATION Right 08/13/2019   Procedure: AMPUTATION RIGHT FIRST AND SECOND TOES;  Surgeon: Larina Earthly, MD;  Location: [facility] OR;  Service: Vascular;  Laterality: Right;   DIRECT LARYNGOSCOPY Bilateral 04/06/2019   Procedure: MICRO DIRECT LARYNGOSCOPY WITH PROLARYN INJECTION;  Surgeon: Christia Reading, MD;  Location: [facility] OR;  Service: ENT;  Laterality: Bilateral;   IR GASTROSTOMY TUBE MOD SED  03/10/2019   IR GASTROSTOMY TUBE REMOVAL  04/20/2019   WOUND DEBRIDEMENT Left 08/13/2019   Procedure: AMPUTATION  OF LEFT FIRST TOE, AND DEBRIDEMENT OF LEFT SECOND, THIRD AND FOURTH TOES;  Surgeon: Larina Earthly, MD;  Location: [facility] OR;  Service: Vascular;  Laterality:  Left;    There were no vitals filed for this visit.   Subjective Assessment - 09/14/19 0848    Subjective Pt reports he is doing well. Gets his custom shoes next week.    Patient is accompained by: Interpreter   in person interpreter, [name]   Pertinent History COVID infection, hx of CVA (January 2021), hx of PE, gangerene of toes    Limitations Standing;Walking    Currently in Pain? Yes    Pain Score 4     Pain Location Foot    Pain Orientation Right;Left    Pain Descriptors / Indicators Aching    Pain Type Acute pain    Pain Onset 1 to 4 weeks ago    Pain Frequency Intermittent    Aggravating Factors  being up on feet    Pain Relieving Factors tylenol                             [facility] Adult PT Treatment/Exercise - 09/14/19 0850      Ambulation/Gait   Ambulation/Gait Yes    Ambulation/Gait Assistance 5: Supervision    Ambulation/Gait Assistance Details Pt has decreased toe off due to amputations. Reports some numbness in bottom of glut area with prolonged walking or sitting since amputation    Ambulation Distance (Feet) 850 Feet  Assistive device None    Gait Pattern Step-through pattern    Ambulation Surface Level;Unlevel;Indoor;Outdoor;Paved;Grass      Neuro Re-ed    Neuro Re-ed Details  In corner: standing on airex with feet together eyes open x 30 sec then eyes closed x 30 sec then head turns up/down and left/right x 10 each. Marching in place x 10 Standing on roockerboard positioned ant/post maintaining level x 30 sec then 30 sec x 2 eyes closed with increased sway. Rocking board ant/post x 10. Wall bumps x 15 with verbal cues for form to utilize ankle strategy. CGA with balance activities for safety.                   PT Education - 09/14/19 1224    Education Details Added wall bump to HEP    Person(s) Educated Patient    Methods Explanation;Demonstration;Handout    Comprehension Verbalized understanding;Returned demonstration             PT Short Term Goals - 09/03/19 1535      PT SHORT TERM GOAL #1   Title Patient will be independent with initial HEP for Balance/Strengthening (All STG due on 4th Visit)    Baseline no HEP established    Time 4    Period Weeks    Status New    Target Date 10/01/19   after 4 visits     PT SHORT TERM GOAL #2   Title Patient will report understanding of fall prevention strategies    Baseline dependent    Time 4    Period Weeks    Status New      PT SHORT TERM GOAL #3   Title Patient will demo ability to ambulate >500 ft on indoor surfaces, Mod I with no AD for improved ambulation    Baseline 115 ft supervision    Time 4    Period Weeks    Status New             PT Long Term Goals - 09/03/19 1537      PT LONG TERM GOAL #1   Title Patient will be independent with Final HEP for Balance/Strengthening (All LTG due after 8th Visit)    Baseline no HEP established    Time 8    Period Weeks    Status New    Target Date 10/29/19      PT LONG TERM GOAL #2   Title Patient will improve Berg Balance to >/= 52/56 to demonstrate reduced fall risk and improved balance    Baseline 45/56    Time 8    Period Weeks    Status New      PT LONG TERM GOAL #3   Title Patient will demo ability to ambulate >1000 ft on indoor/outdoor surfaces with no AD at Mod I    Baseline 115 supervision    Time 8    Period Weeks    Status New      PT LONG TERM GOAL #4   Title Patient will demo ability to ascend/descend 12 stairs at Mod I level for improved ambulation within home    Baseline no baseline established    Time 8    Period Weeks    Status New      PT LONG TERM GOAL #5   Title Patient will report being able to stand for >10 minutes with no increase in pain in bilateral feet    Baseline 3/10 pain with standing  Time 8    Period Weeks    Status New                 Plan - 09/14/19 1225    Clinical Impression Statement Pt showing improvement in stability with gait and  balance activities. Reporting less pain in feet. Does report some numbness in gluteal area when walking or sitting a lot.    Personal Factors and Comorbidities Comorbidity 2;Time since onset of injury/illness/exacerbation;Transportation    Comorbidities Hx of CVA, Gangerene of toes, COVID - 19    Examination-Activity Limitations Squat;Stairs;Stand;Locomotion Level    Examination-Participation Restrictions Community Activity;Driving;Yard Work;Shop    Stability/Clinical Decision Making Evolving/Moderate complexity    Rehab Potential Fair    PT Frequency 1x / week    PT Duration 8 weeks    PT Treatment/Interventions ADLs/Self Care Home Management;Gait training;Stair training;Functional mobility training;Therapeutic exercise;Therapeutic activities;Balance training;Patient/family education;Orthotic Fit/Training;Passive range of motion;Manual techniques;Energy conservation;Visual/perceptual remediation/compensation;Joint Manipulations;DME Instruction;Neuromuscular re-education    PT Next Visit Plan Continue Gait Training on varied surfaces, Educate on Fall Prevention,  Continue to progress balance.    Consulted and Agree with Plan of Care Patient           Patient will benefit from skilled therapeutic intervention in order to improve the following deficits and impairments:  Abnormal gait, Decreased balance, Decreased mobility, Decreased endurance, Decreased range of motion, Decreased strength, Difficulty walking, Impaired sensation, Pain, Decreased activity tolerance, Decreased knowledge of use of DME  Visit Diagnosis: Other abnormalities of gait and mobility  Muscle weakness (generalized)     Problem List Patient Active Problem List   Diagnosis Date Noted   Impaired functional mobility, balance, gait, and endurance 08/23/2019   Amputation of great toe (HCC) 08/23/2019   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   Impaired ambulation 06/11/2019   Dependent on walker for ambulation 04/30/2019    Right hemiparesis (HCC) 04/30/2019   Tachycardia 04/30/2019   Acute blood loss anemia    Uncontrolled type 2 diabetes mellitus with hyperglycemia (HCC)    Dry gangrene (HCC)    Leukocytosis    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   Status post tracheostomy (HCC)    Pressure injury of skin 03/02/2019   On mechanically assisted ventilation (HCC)    Goals of care, counseling/discussion    Palliative care encounter    ARDS (adult respiratory distress syndrome) (HCC)    Cerebral embolism with cerebral infarction 02/17/2019   Acute respiratory failure with hypoxia (HCC)    Pneumothorax, right     Ronn Melena, PT, DPT, NCS 09/14/2019, 12:34 PM  [facility] [address] [place], [place], [address] Phone: [phone]   Fax:  [phone]  Name: Andrew Bruce MRN: [id number] Date of Birth: March 09, 1965

## 2019-09-15 DIAGNOSIS — H5213 Myopia, bilateral: Secondary | ICD-10-CM | POA: Diagnosis not present

## 2019-09-16 ENCOUNTER — Ambulatory Visit: Payer: Medicaid Other

## 2019-09-16 ENCOUNTER — Encounter: Payer: Medicaid Other | Admitting: Occupational Therapy

## 2019-09-21 ENCOUNTER — Ambulatory Visit: Payer: Medicaid Other

## 2019-09-21 ENCOUNTER — Encounter: Payer: Medicaid Other | Admitting: Speech Pathology

## 2019-09-21 ENCOUNTER — Other Ambulatory Visit: Payer: Self-pay

## 2019-09-21 ENCOUNTER — Ambulatory Visit: Payer: Medicaid Other | Attending: Physical Medicine and Rehabilitation

## 2019-09-21 ENCOUNTER — Encounter: Payer: Medicaid Other | Admitting: Occupational Therapy

## 2019-09-21 DIAGNOSIS — G8191 Hemiplegia, unspecified affecting right dominant side: Secondary | ICD-10-CM | POA: Diagnosis present

## 2019-09-21 DIAGNOSIS — I69351 Hemiplegia and hemiparesis following cerebral infarction affecting right dominant side: Secondary | ICD-10-CM | POA: Diagnosis present

## 2019-09-21 DIAGNOSIS — M6281 Muscle weakness (generalized): Secondary | ICD-10-CM | POA: Diagnosis present

## 2019-09-21 DIAGNOSIS — R2689 Other abnormalities of gait and mobility: Secondary | ICD-10-CM | POA: Diagnosis present

## 2019-09-21 DIAGNOSIS — R2681 Unsteadiness on feet: Secondary | ICD-10-CM | POA: Diagnosis present

## 2019-09-21 DIAGNOSIS — R278 Other lack of coordination: Secondary | ICD-10-CM | POA: Diagnosis present

## 2019-09-21 NOTE — Therapy (Signed)
[facility] [address] [place], [place], [address] Phone: [phone]   Fax:  [phone]  Physical Therapy Treatment  Patient Details  Name: Andrew Bruce MRN: [id number] Date of Birth: Mar 22, 1965 Referring Provider (PT): Genice Rouge, MD   Encounter Date: 09/21/2019   PT End of Session - 09/21/19 1553    Visit Number 4    Number of Visits 9    Date for PT Re-Evaluation 12/02/19   POC for 8 weeks, Cert for 90 days   Authorization Type [employer] Medicaid (27 visit limit)    Authorization Time Period no auth required currently    Progress Note Due on Visit 8    PT Start Time 1535    PT Stop Time 1615    PT Time Calculation (min) 40 min    Equipment Utilized During Treatment Gait belt    Activity Tolerance Patient tolerated treatment well    Behavior During Therapy WFL for tasks assessed/performed   Tearful          Past Medical History:  Diagnosis Date  . COVID-19 02/06/2019  . Diabetes mellitus without complication (HCC)   . Hypertension   . 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 Pneumonia    with Covid  . Stroke [facility])    weakness on right side, occurred while he had Covid    Past Surgical History:  Procedure Laterality Date  . AMPUTATION Right 08/13/2019   Procedure: AMPUTATION RIGHT FIRST AND SECOND TOES;  Surgeon: Larina Earthly, MD;  Location: [facility] OR;  Service: Vascular;  Laterality: Right;  . DIRECT LARYNGOSCOPY Bilateral 04/06/2019   Procedure: MICRO DIRECT LARYNGOSCOPY WITH PROLARYN INJECTION;  Surgeon: Christia Reading, MD;  Location: [facility] OR;  Service: ENT;  Laterality: Bilateral;  . IR GASTROSTOMY TUBE MOD SED  03/10/2019  . IR GASTROSTOMY TUBE REMOVAL  04/20/2019  . WOUND DEBRIDEMENT Left 08/13/2019   Procedure: AMPUTATION  OF LEFT FIRST TOE, AND DEBRIDEMENT OF LEFT SECOND, THIRD AND FOURTH TOES;  Surgeon: Larina Earthly, MD;  Location: [facility] OR;   Service: Vascular;  Laterality: Left;    There were no vitals filed for this visit.   Subjective Assessment - 09/21/19 1552    Subjective I am doing okay. I feel numbness on top of both feet. Pain is a 3/10 in amputated toes. He has been having some R hip groin pain for past week.    Patient is accompained by: Interpreter   in person interpreter, [name]   Pertinent History COVID infection, hx of CVA (January 2021), hx of PE, gangerene of toes    Limitations Standing;Walking    Pain Onset 1 to 4 weeks ago               Manually stretched toes into flexion and extension for varying times Isolated MTP and IP flexion with forefoot stabilized: AROM: 20x Self tennis ball massage: 4' each foot Self toe flexion and extension stretching: 20" x 4 R and L Tandem standing: 3 x 30" R and L SLS: R and L 5 x 15" R and L Walking fwd and bwd: EC: 5 x 10 feet Stnading with narrow BOS, horizontal head turns and EC on foam: 1' Partial tandem on decline with vertical head turns: 10x R and L  PT Short Term Goals - 09/03/19 1535      PT SHORT TERM GOAL #1   Title Patient will be independent with initial HEP for Balance/Strengthening (All STG due on 4th Visit)    Baseline no HEP established    Time 4    Period Weeks    Status New    Target Date 10/01/19   after 4 visits     PT SHORT TERM GOAL #2   Title Patient will report understanding of fall prevention strategies    Baseline dependent    Time 4    Period Weeks    Status New      PT SHORT TERM GOAL #3   Title Patient will demo ability to ambulate >500 ft on indoor surfaces, Mod I with no AD for improved ambulation    Baseline 115 ft supervision    Time 4    Period Weeks    Status New             PT Long Term Goals - 09/03/19 1537      PT LONG TERM GOAL #1   Title Patient will be independent with Final HEP for Balance/Strengthening (All LTG due after 8th Visit)    Baseline no HEP  established    Time 8    Period Weeks    Status New    Target Date 10/29/19      PT LONG TERM GOAL #2   Title Patient will improve Berg Balance to >/= 52/56 to demonstrate reduced fall risk and improved balance    Baseline 45/56    Time 8    Period Weeks    Status New      PT LONG TERM GOAL #3   Title Patient will demo ability to ambulate >1000 ft on indoor/outdoor surfaces with no AD at Mod I    Baseline 115 supervision    Time 8    Period Weeks    Status New      PT LONG TERM GOAL #4   Title Patient will demo ability to ascend/descend 12 stairs at Mod I level for improved ambulation within home    Baseline no baseline established    Time 8    Period Weeks    Status New      PT LONG TERM GOAL #5   Title Patient will report being able to stand for >10 minutes with no increase in pain in bilateral feet    Baseline 3/10 pain with standing    Time 8    Period Weeks    Status New                 Plan - 09/21/19 1553    Clinical Impression Statement Toda's skilled session was focused on improving bil foot mobility and ankle mobility to imporve pain, stiffness and gait. Pt reported no numbness on top of the feet but reported numbness on plantar aspect at end of the session.    Personal Factors and Comorbidities Comorbidity 2;Time since onset of injury/illness/exacerbation;Transportation    Comorbidities Hx of CVA, Gangerene of toes, COVID - 19    Examination-Activity Limitations Squat;Stairs;Stand;Locomotion Level    Examination-Participation Restrictions Community Activity;Driving;Yard Work;Shop    Stability/Clinical Decision Making Evolving/Moderate complexity    Rehab Potential Fair    PT Frequency 1x / week    PT Duration 8 weeks    PT Treatment/Interventions ADLs/Self Care Home Management;Gait training;Stair training;Functional mobility training;Therapeutic exercise;Therapeutic activities;Balance training;Patient/family education;Orthotic Fit/Training;Passive range  of  motion;Manual techniques;Energy conservation;Visual/perceptual remediation/compensation;Joint Manipulations;DME Instruction;Neuromuscular re-education    PT Next Visit Plan Continue Gait Training on varied surfaces, Educate on Fall Prevention,  Continue to progress balance.    Consulted and Agree with Plan of Care Patient           Patient will benefit from skilled therapeutic intervention in order to improve the following deficits and impairments:  Abnormal gait, Decreased balance, Decreased mobility, Decreased endurance, Decreased range of motion, Decreased strength, Difficulty walking, Impaired sensation, Pain, Decreased activity tolerance, Decreased knowledge of use of DME  Visit Diagnosis: Other abnormalities of gait and mobility  Muscle weakness (generalized)  Hemiplegia and hemiparesis following cerebral infarction affecting right dominant side [facility])     Problem List Patient Active Problem List   Diagnosis Date Noted  . Impaired functional mobility, balance, gait, and endurance 08/23/2019  . Amputation of great toe (HCC) 08/23/2019  . 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 Impaired ambulation 06/11/2019  . Dependent on walker for ambulation 04/30/2019  . Right hemiparesis (HCC) 04/30/2019  . Tachycardia 04/30/2019  . Acute blood loss anemia   . Uncontrolled type 2 diabetes mellitus with hyperglycemia (HCC)   . Dry gangrene (HCC)   . Leukocytosis   . 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 Status post tracheostomy (HCC)   . Pressure injury of skin 03/02/2019  . On mechanically assisted ventilation (HCC)   . Goals of care, counseling/discussion   . Palliative care encounter   . ARDS (adult respiratory distress syndrome) (HCC)   . Cerebral embolism with cerebral infarction 02/17/2019  . Acute respiratory failure with hypoxia (HCC)   . Pneumothorax, right     Ileana Ladd 09/21/2019, 4:17 PM  [facility] [address] [place], [place], [address] Phone: [phone]   Fax:  [phone]  Name: Andrew Bruce MRN: [id number] Date of Birth: [birth year]

## 2019-09-21 NOTE — Patient Instructions (Signed)
Access Code: [email or web address] URL: [email or web address] Date: 09/14/2019 Prepared by: Elmer Bales  Exercises Standing on foam eyes open - 2 x daily - 7 x weekly - 1 sets - 3 reps - 30 sec hold Romberg Stance Eyes Closed on Foam Pad - 2 x daily - 7 x weekly - 1 sets - 3 reps - 30 sec hold Standing head turns - 2 x daily - 7 x weekly - 1 sets - 10 reps Tandem Stance in Corner - 2 x daily - 7 x weekly - 1 sets - 2 reps - 30 sec hold Side Stepping with Counter Support - 2 x daily - 7 x weekly - 1 sets - 3 reps Walking March - 2 x daily - 7 x weekly - 1 sets - 3 reps Wall bumps with ankles - 1 x daily - 7 x weekly - 2 sets - 10 reps   Access Code: [email or web address] (unable to look up above access code) URL: [email or web address] Date: 09/21/2019 Prepared by: Lavone Nian  Exercises Seated Toe Flexion Extension PROM - 2 x daily - 7 x weekly - 10 reps - 10 hold Toe Extension and Flexion Caregiver PROM - 2 x daily - 7 x weekly - 10 reps - 10 hold Foot Roller Plantar Massage - 1 x daily - 7 x weekly - 2 sets - 10 reps

## 2019-09-23 ENCOUNTER — Encounter: Payer: Medicaid Other | Admitting: Occupational Therapy

## 2019-09-23 ENCOUNTER — Ambulatory Visit: Payer: Medicaid Other

## 2019-09-23 ENCOUNTER — Encounter: Payer: Medicaid Other | Admitting: Speech Pathology

## 2019-09-23 ENCOUNTER — Ambulatory Visit: Payer: Medicaid Other | Admitting: Occupational Therapy

## 2019-09-23 NOTE — Progress Notes (Signed)
POST OPERATIVE OFFICE NOTE    CC:  F/u for surgery  HPI:  This is a 54 y.o. male who is s/p amputation of left first, second, third, and fourth toes and right first and second toes on 08/13/2019 by Dr. Donnetta Hutching.    Pt was last seen on 09/01/2019 and at that time, he was doing well.  He had an area that was ~ 1cm granulating on the dorsum of the left first toe.  His sutures were removed.  He was given Rx for orthosis but instructed to wait another 3-4 weeks to allow toe amputation sites to fully heal.  He was instructed to f/u with [place] again in 3 weeks to check his wounds.   Pt returns today for follow up and is accompanied by his son.   He states he is doing well.  He still doesn't want to look at his feet.  He states he does have numbness in his feet.  Otherwise, he is doing well.    No Known Allergies  Current Outpatient Medications  Medication Sig Dispense Refill  . acetaminophen (TYLENOL) 325 MG tablet Take 1-2 tablets (325-650 mg total) by mouth every 4 (four) hours as needed for mild pain. 100 tablet 0  . apixaban (ELIQUIS) 5 MG TABS tablet Take 1 tablet (5 mg total) by mouth 2 (two) times daily. 60 tablet 1  . atorvastatin (LIPITOR) 20 MG tablet Take 1 tablet (20 mg total) by mouth daily at 6 PM. 30 tablet 0  . blood glucose meter kit and supplies KIT Dispense based on patient and insurance preference. Use up to four times daily as directed. (FOR ICD-9 250.00, 250.01). 1 each 0  . doxazosin (CARDURA) 1 MG tablet Take 1 tablet (1 mg total) by mouth at bedtime. 30 tablet 0  . famotidine (PEPCID) 20 MG tablet Take 1 tablet (20 mg total) by mouth 2 (two) times daily. 60 tablet 1  . glipiZIDE (GLUCOTROL) 5 MG tablet Take 0.5 tablets (2.5 mg total) by mouth daily before breakfast. 30 tablet 0  . ibuprofen (ADVIL) 800 MG tablet Take 800 mg by mouth every 6 (six) hours as needed.    . iron polysaccharides (NIFEREX) 150 MG capsule Take 1 capsule (150 mg total) by mouth 2 (two) times daily. 60  capsule 0  . metFORMIN (GLUCOPHAGE) 500 MG tablet Take 1 tablet (500 mg total) by mouth 2 (two) times daily with a meal. 60 tablet 0  . metoprolol tartrate (LOPRESSOR) 25 MG tablet Take 0.5 tablets (12.5 mg total) by mouth 2 (two) times daily. 30 tablet 0  . oxyCODONE-acetaminophen (PERCOCET) 5-325 MG tablet Take 1 tablet by mouth every 4 (four) hours as needed for severe pain. 20 tablet 0  . potassium chloride (KLOR-CON) 10 MEQ tablet Take 1 tablet (10 mEq total) by mouth daily. 30 tablet 0  . traMADol (ULTRAM) 50 MG tablet Take 1-2 tablets (50-100 mg total) by mouth every 6 (six) hours as needed for moderate pain or severe pain. 180 tablet 3  . traZODone (DESYREL) 50 MG tablet Take 0.5 tablets (25 mg total) by mouth at bedtime as needed for sleep. 15 tablet 0   No current facility-administered medications for this visit.     ROS:  See HPI  Physical Exam:  Today's Vitals   09/24/19 1533  BP: (!) 159/98  Pulse: 89  Resp: 20  Temp: 98.7 F (37.1 C)  TempSrc: Temporal  SpO2: 97%  Weight: 177 lb 11.2 oz (80.6 kg)  Height:  5' 6"  (1.676 m)  PainSc: 2    Body mass index is 28.68 kg/m.   Incision:    Right foot   Left foot    Extremities:  Easily palpable DP pulses bilaterally; bilateral feet are warm and well perfused.    Assessment/Plan:  This is a 54 y.o. male who is s/p: amputation of left first, second, third, and fourth toes and right first and second toes on 08/13/2019 by Dr. Donnetta Hutching.     -pt's amputation sites are healing nicely.  There is still a scab present on the left great toe site.  Will have him return in 4-6 weeks to check his wounds.   Anticipate these will be healed by that visit.  Discussed with he and his son that if he has any issues before his next visit that he should call us sooner. -discussed with him to not wear shoe inserts until his wounds have 100% healed. He is wearing his sandals.   Leontine Locket, [facility] [phone]  Clinic MD:  Donzetta Matters

## 2019-09-24 ENCOUNTER — Other Ambulatory Visit: Payer: Self-pay

## 2019-09-24 ENCOUNTER — Ambulatory Visit (INDEPENDENT_AMBULATORY_CARE_PROVIDER_SITE_OTHER): Payer: Self-pay | Admitting: Physician Assistant

## 2019-09-24 VITALS — BP 159/98 | HR 89 | Temp 98.7°F | Resp 20 | Ht 66.0 in | Wt 177.7 lb

## 2019-09-24 DIAGNOSIS — L97519 Non-pressure chronic ulcer of other part of right foot with unspecified severity: Secondary | ICD-10-CM

## 2019-09-24 DIAGNOSIS — L97529 Non-pressure chronic ulcer of other part of left foot with unspecified severity: Secondary | ICD-10-CM

## 2019-09-28 ENCOUNTER — Encounter: Payer: Medicaid Other | Admitting: Speech Pathology

## 2019-09-28 ENCOUNTER — Encounter: Payer: Medicaid Other | Admitting: Occupational Therapy

## 2019-09-28 ENCOUNTER — Ambulatory Visit: Payer: Medicaid Other

## 2019-09-29 ENCOUNTER — Other Ambulatory Visit: Payer: Self-pay

## 2019-09-29 ENCOUNTER — Ambulatory Visit: Payer: Medicaid Other

## 2019-09-29 DIAGNOSIS — R2681 Unsteadiness on feet: Secondary | ICD-10-CM

## 2019-09-29 DIAGNOSIS — R2689 Other abnormalities of gait and mobility: Secondary | ICD-10-CM | POA: Diagnosis not present

## 2019-09-29 DIAGNOSIS — M6281 Muscle weakness (generalized): Secondary | ICD-10-CM

## 2019-09-29 DIAGNOSIS — I69351 Hemiplegia and hemiparesis following cerebral infarction affecting right dominant side: Secondary | ICD-10-CM

## 2019-09-29 NOTE — Therapy (Signed)
[facility] [address] [place], [place], [address] Phone: [phone]   Fax:  [phone]  Physical Therapy Treatment  Patient Details  Name: Andrew Bruce MRN: [id number] Date of Birth: August 26, 1965 Referring Provider (PT): Genice Rouge, MD   Encounter Date: 09/29/2019   PT End of Session - 09/29/19 1644    Visit Number 5    Number of Visits 9    Date for PT Re-Evaluation 12/02/19   POC for 8 weeks, Cert for 90 days   Authorization Type [employer] Medicaid (27 visit limit)    Authorization Time Period no auth required currently    Progress Note Due on Visit 8    PT Start Time 1615    PT Stop Time 1700    PT Time Calculation (min) 45 min    Equipment Utilized During Treatment Gait belt    Activity Tolerance Patient tolerated treatment well    Behavior During Therapy WFL for tasks assessed/performed   Tearful          Past Medical History:  Diagnosis Date  . COVID-19 02/06/2019  . Diabetes mellitus without complication (HCC)   . Hypertension   . 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 Pneumonia    with Covid  . Stroke [facility])    weakness on right side, occurred while he had Covid    Past Surgical History:  Procedure Laterality Date  . AMPUTATION Right 08/13/2019   Procedure: AMPUTATION RIGHT FIRST AND SECOND TOES;  Surgeon: Larina Earthly, MD;  Location: [facility] OR;  Service: Vascular;  Laterality: Right;  . DIRECT LARYNGOSCOPY Bilateral 04/06/2019   Procedure: MICRO DIRECT LARYNGOSCOPY WITH PROLARYN INJECTION;  Surgeon: Christia Reading, MD;  Location: [facility] OR;  Service: ENT;  Laterality: Bilateral;  . IR GASTROSTOMY TUBE MOD SED  03/10/2019  . IR GASTROSTOMY TUBE REMOVAL  04/20/2019  . WOUND DEBRIDEMENT Left 08/13/2019   Procedure: AMPUTATION  OF LEFT FIRST TOE, AND DEBRIDEMENT OF LEFT SECOND, THIRD AND FOURTH TOES;  Surgeon: Larina Earthly, MD;  Location: [facility] OR;   Service: Vascular;  Laterality: Left;    There were no vitals filed for this visit.   Subjective Assessment - 09/29/19 1625    Subjective I got my shoes but they were too narrow so they ordered new ones and I should be getting them back next week, 10/05/19. Pt reports he feels pain and tingling that is worst at night under big toe and plantar aspect of foot.    Patient is accompained by: Interpreter   in person interpreter, [name]   Pertinent History COVID infection, hx of CVA (January 2021), hx of PE, gangerene of toes    Limitations Standing;Walking    Pain Onset 1 to 4 weeks ago            Grade IV calcaneal eversion mobilization bil Grade IV mid foot mobilization Intertarsal mobilization bil Manually resisted DF and PF with emphasis on neutral foot position to reduce excessive inversion 3 x 10 R and L Manually resisted inversion and eversion with plantarflexion bias: 3 x 10 R and L Manually resisted hip internal and external rotation in seated position: 3 x 10 R and L Tandem stance on decline with head turns: 2 x 10 Standing unilateral wobble board: foot inversion and eversion 9contralateral leg on floor: 2 x 10 R and L  PT Short Term Goals - 09/03/19 1535      PT SHORT TERM GOAL #1   Title Patient will be independent with initial HEP for Balance/Strengthening (All STG due on 4th Visit)    Baseline no HEP established    Time 4    Period Weeks    Status New    Target Date 10/01/19   after 4 visits     PT SHORT TERM GOAL #2   Title Patient will report understanding of fall prevention strategies    Baseline dependent    Time 4    Period Weeks    Status New      PT SHORT TERM GOAL #3   Title Patient will demo ability to ambulate >500 ft on indoor surfaces, Mod I with no AD for improved ambulation    Baseline 115 ft supervision    Time 4    Period Weeks    Status New             PT Long Term Goals - 09/03/19 1537       PT LONG TERM GOAL #1   Title Patient will be independent with Final HEP for Balance/Strengthening (All LTG due after 8th Visit)    Baseline no HEP established    Time 8    Period Weeks    Status New    Target Date 10/29/19      PT LONG TERM GOAL #2   Title Patient will improve Berg Balance to >/= 52/56 to demonstrate reduced fall risk and improved balance    Baseline 45/56    Time 8    Period Weeks    Status New      PT LONG TERM GOAL #3   Title Patient will demo ability to ambulate >1000 ft on indoor/outdoor surfaces with no AD at Mod I    Baseline 115 supervision    Time 8    Period Weeks    Status New      PT LONG TERM GOAL #4   Title Patient will demo ability to ascend/descend 12 stairs at Mod I level for improved ambulation within home    Baseline no baseline established    Time 8    Period Weeks    Status New      PT LONG TERM GOAL #5   Title Patient will report being able to stand for >10 minutes with no increase in pain in bilateral feet    Baseline 3/10 pain with standing    Time 8    Period Weeks    Status New                 Plan - 09/29/19 1628    Clinical Impression Statement We continued to focus on improving foot mobility and muscle control in bil feet and ankles. Pt tends to have weakness in bil ankle everters compared t inverters. AFter anual therapy and exercises, patient reported pain decreased to 0-1/10 from 3/10    Personal Factors and Comorbidities Comorbidity 2;Time since onset of injury/illness/exacerbation;Transportation    Comorbidities Hx of CVA, Gangerene of toes, COVID - 19    Examination-Activity Limitations Squat;Stairs;Stand;Locomotion Level    Examination-Participation Restrictions Community Activity;Driving;Yard Work;Shop    Stability/Clinical Decision Making Evolving/Moderate complexity    Rehab Potential Fair    PT Frequency 1x / week    PT Duration 8 weeks    PT Treatment/Interventions ADLs/Self Care Home Management;Gait  training;Stair training;Functional mobility training;Therapeutic exercise;Therapeutic activities;Balance training;Patient/family education;Orthotic Fit/Training;Passive  range of motion;Manual techniques;Energy conservation;Visual/perceptual remediation/compensation;Joint Manipulations;DME Instruction;Neuromuscular re-education    PT Next Visit Plan Continue Gait Training on varied surfaces, Educate on Fall Prevention,  Continue to progress balance.    Consulted and Agree with Plan of Care Patient           Patient will benefit from skilled therapeutic intervention in order to improve the following deficits and impairments:  Abnormal gait, Decreased balance, Decreased mobility, Decreased endurance, Decreased range of motion, Decreased strength, Difficulty walking, Impaired sensation, Pain, Decreased activity tolerance, Decreased knowledge of use of DME  Visit Diagnosis: Other abnormalities of gait and mobility  Muscle weakness (generalized)  Hemiplegia and hemiparesis following cerebral infarction affecting right dominant side (HCC)  Unsteadiness on feet     Problem List Patient Active Problem List   Diagnosis Date Noted  . Impaired functional mobility, balance, gait, and endurance 08/23/2019  . Amputation of great toe (HCC) 08/23/2019  . 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 Impaired ambulation 06/11/2019  . Dependent on walker for ambulation 04/30/2019  . Right hemiparesis (HCC) 04/30/2019  . Tachycardia 04/30/2019  . 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 Acute blood loss anemia   . Uncontrolled type 2 diabetes mellitus with hyperglycemia (HCC)   . Dry gangrene (HCC)   . Leukocytosis   . 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 Status post tracheostomy (HCC)   . Pressure injury of skin 03/02/2019  . On mechanically assisted ventilation (HCC)   . Goals of care, counseling/discussion   . Palliative care encounter   . ARDS (adult respiratory distress  syndrome) (HCC)   . Cerebral embolism with cerebral infarction 02/17/2019  . Acute respiratory failure with hypoxia (HCC)   . Pneumothorax, right     Ileana Ladd 09/29/2019, 5:42 PM  [facility] [address] [place], [place], [address] Phone: [phone]   Fax:  [phone]  Name: Caedon Bond MRN: [id number] Date of Birth: 1965/10/06

## 2019-09-30 ENCOUNTER — Encounter: Payer: Medicaid Other | Admitting: Occupational Therapy

## 2019-09-30 ENCOUNTER — Ambulatory Visit: Payer: Medicaid Other

## 2019-09-30 ENCOUNTER — Ambulatory Visit: Payer: Medicaid Other | Admitting: Occupational Therapy

## 2019-10-05 ENCOUNTER — Ambulatory Visit: Payer: Medicaid Other

## 2019-10-05 ENCOUNTER — Encounter: Payer: Medicaid Other | Admitting: Speech Pathology

## 2019-10-05 ENCOUNTER — Encounter: Payer: Medicaid Other | Admitting: Occupational Therapy

## 2019-10-07 ENCOUNTER — Encounter: Payer: Medicaid Other | Admitting: Occupational Therapy

## 2019-10-07 ENCOUNTER — Encounter: Payer: Medicaid Other | Admitting: Speech Pathology

## 2019-10-07 ENCOUNTER — Ambulatory Visit: Payer: Medicaid Other

## 2019-10-08 ENCOUNTER — Other Ambulatory Visit: Payer: Self-pay

## 2019-10-08 ENCOUNTER — Ambulatory Visit: Payer: Medicaid Other | Admitting: Occupational Therapy

## 2019-10-08 ENCOUNTER — Ambulatory Visit: Payer: Medicaid Other

## 2019-10-08 ENCOUNTER — Encounter: Payer: Self-pay | Admitting: Occupational Therapy

## 2019-10-08 DIAGNOSIS — R2681 Unsteadiness on feet: Secondary | ICD-10-CM

## 2019-10-08 DIAGNOSIS — M6281 Muscle weakness (generalized): Secondary | ICD-10-CM

## 2019-10-08 DIAGNOSIS — R278 Other lack of coordination: Secondary | ICD-10-CM

## 2019-10-08 DIAGNOSIS — G8191 Hemiplegia, unspecified affecting right dominant side: Secondary | ICD-10-CM

## 2019-10-08 DIAGNOSIS — R2689 Other abnormalities of gait and mobility: Secondary | ICD-10-CM | POA: Diagnosis not present

## 2019-10-08 DIAGNOSIS — I69351 Hemiplegia and hemiparesis following cerebral infarction affecting right dominant side: Secondary | ICD-10-CM

## 2019-10-08 NOTE — Therapy (Signed)
[facility] [address] [place], [place], [address] Phone: [phone]   Fax:  [phone]  Occupational Therapy Treatment  Patient Details  Name: Andrew Bruce MRN: [id number] Date of Birth: Aug 14, 1965 Referring Provider (OT): Dr. Courtney Heys   Encounter Date: 10/08/2019   OT End of Session - 10/08/19 1406    Visit Number 3    Number of Visits 9    Date for OT Re-Evaluation 11/08/19    Authorization Type U[employer] Medicaid, 27 visit limit combined (used 4 previously)    Authorization - Visit Number 3    Authorization - Number of Visits 8    OT Start Time 1402    OT Stop Time 1445    OT Time Calculation (min) 43 min    Activity Tolerance Patient tolerated treatment well    Behavior During Therapy [facility] for tasks assessed/performed           Past Medical History:  Diagnosis Date  . COVID-19 02/06/2019  . Diabetes mellitus without complication ([place])   . Hypertension   . PE (pulmonary thromboembolism) ([place])    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  . Pneumonia    with Covid  . Stroke [facility])    weakness on right side, occurred while he had Covid    Past Surgical History:  Procedure Laterality Date  . AMPUTATION Right 08/13/2019   Procedure: AMPUTATION RIGHT FIRST AND SECOND TOES;  Surgeon: Rosetta Posner, MD;  Location: [facility];  Service: Vascular;  Laterality: Right;  . DIRECT LARYNGOSCOPY Bilateral 04/06/2019   Procedure: MICRO DIRECT LARYNGOSCOPY WITH PROLARYN INJECTION;  Surgeon: Melida Quitter, MD;  Location: [facility];  Service: ENT;  Laterality: Bilateral;  . IR GASTROSTOMY TUBE MOD SED  03/10/2019  . IR GASTROSTOMY TUBE REMOVAL  04/20/2019  . WOUND DEBRIDEMENT Left 08/13/2019   Procedure: AMPUTATION  OF LEFT FIRST TOE, AND DEBRIDEMENT OF LEFT SECOND, THIRD AND FOURTH TOES;  Surgeon: Rosetta Posner, MD;  Location: [facility];  Service: Vascular;  Laterality: Left;    There were no vitals filed for this  visit.   Subjective Assessment - 10/08/19 1403    Subjective  Pt reports a little bit of pain but not much. Pain in both his feet. Pt reports doing his HEP exercises at home consistently. Reviewed HEP and he was familiar with the exercises.    Pertinent History CVA w/ mild Rt hemiparesis secondary to COVID and ARF Jan 2021. Pt intubated 02/07/19 and trachecd 07/19/21 - ICU complicated by PE and multiple CVA'S, gangrene of toes Lt 1-4 and Rt 1, 2, 3, 5 s/p amputation of R 1,2 toes and L 1st toe    Limitations fall risk    Patient Stated Goals be able to write better, walk better, reach higher    Currently in Pain? Yes    Pain Score 2     Pain Location Foot    Pain Orientation Left;Right    Pain Descriptors / Indicators Numbness    Pain Type Acute pain    Pain Onset 1 to 4 weeks ago    Pain Frequency Constant    Aggravating Factors  just sitting    Pain Relieving Factors moving it around                  Treatment:  Spanish InterpreterLincoln Maxin #[id number]  Reviewed HEP - see 09/14/19 pt instructions.    Functional Reaching with resistance clothespins 1-8# with  RUE.  Achieved approximately 110 degrees of active shoulder flexion with RUE.   Gripper on level 2 - 3 with RUE with 1 inch blocks. Pt had much difficulty at level 3.  OT assessed STGs. Patient has met 2/4 at this time.      OT Education - 10/08/19 1409    Education Details reviewed HEP see note/pt instructions from 09/14/19    Person(s) Educated Patient    Methods Explanation;Demonstration;Verbal cues;Handout    Comprehension Verbalized understanding;Returned demonstration;Verbal cues required            OT Short Term Goals - 10/08/19 1436      OT SHORT TERM GOAL #1   Title Pt will be independent with updated HEP for RUE strength and coordination.--check STGs 10/09/19    Baseline needs updates to previous HEP for progression    Time 4    Period Weeks    Status On-going   pt has been consistently doing HEP -  supervision with exercises 10/08/19     OT SHORT TERM GOAL #2   Title Pt will demo at least 120* R shoulder flex for functional reaching without pain.    Baseline 115*    Time 4    Period Weeks    Status On-going   126* R shoulder flexion 10/08/19 pain 2/10     OT SHORT TERM GOAL #3   Title Pt will demo at least 40lbs R grip strength to assist with opening containers/lifting objects.    Baseline R- 36lbs, L-56.6lbs    Time 4    Period Weeks    Status Achieved   42.3 lbs RUE 10/08/19     OT SHORT TERM GOAL #4   Title Pt will improve R lateral pinch strength to at least 12lbs to assist with ADLs/opening packages.    Baseline R-9lbs, L-16lbs    Time 4    Period Weeks    Status Achieved   12 lbs RUE lateral pinch 10/08/19     OT SHORT TERM GOAL #5   Title --             OT Long Term Goals - 09/09/19 1444      OT LONG TERM GOAL #1   Title Pt wil improve RUE strength/ROM as shown by being able to retrieve/replace 3lb object on overhead shelf safely.    Baseline unable    Time 8    Period Weeks    Status New      OT LONG TERM GOAL #2   Title Pt will demo at least 125* R shoulder flex for functional reaching without pain.    Baseline 115*    Time 8    Period Weeks    Status New      OT LONG TERM GOAL #3   Title Pt will demo at least 45lbs R grip strength to assist with opening containers/gripping objects.    Baseline R-36lbs, L 56lbs    Time 8    Period Weeks    Status New      OT LONG TERM GOAL #4   Title Pt will demo at least 9lbs R tip pinch strength to assist with ADLs/holding objects for IADLs.    Baseline R-5lbs, L-13lbs    Time 8    Period Weeks    Status New      OT LONG TERM GOAL #5   Title --  Plan - 10/08/19 1412    Clinical Impression Statement Pt is progressing towards his goals. Pt has not been seen in a few weeks but has been consistent with HEP as evidenced by reviewing with min A.    OT Occupational Profile and History  Detailed Assessment- Review of Records and additional review of physical, cognitive, psychosocial history related to current functional performance    Occupational performance deficits (Please refer to evaluation for details): ADL's;IADL's;Work;Leisure    Body Structure / Function / Physical Skills ADL;ROM;IADL;Endurance;Body mechanics;Sensation;Mobility;Flexibility;Strength;Coordination;FMC;Pain;UE functional use;Decreased knowledge of use of DME;Dexterity    Rehab Potential Good    Clinical Decision Making Several treatment options, min-mod task modification necessary    Comorbidities Affecting Occupational Performance: Presence of comorbidities impacting occupational performance    Comorbidities impacting occupational performance description: long Covid, toe amputation    Modification or Assistance to Complete Evaluation  Min-Moderate modification of tasks or assist with assess necessary to complete eval    OT Frequency 1x / week    OT Duration 8 weeks   +eval   OT Treatment/Interventions Self-care/ADL training;Therapeutic exercise;Functional Mobility Training;Aquatic Therapy;Neuromuscular education;Manual Therapy;Energy conservation;Therapeutic activities;DME and/or AE instruction;Moist Heat;Passive range of motion;Patient/family education;Fluidtherapy    Plan functional reaching with dynamic balance, grip strength    Consulted and Agree with Plan of Care Patient           Patient will benefit from skilled therapeutic intervention in order to improve the following deficits and impairments:   Body Structure / Function / Physical Skills: ADL, ROM, IADL, Endurance, Body mechanics, Sensation, Mobility, Flexibility, Strength, Coordination, FMC, Pain, UE functional use, Decreased knowledge of use of DME, Dexterity       Visit Diagnosis: Other abnormalities of gait and mobility  Muscle weakness (generalized)  Hemiplegia and hemiparesis following cerebral infarction affecting right dominant  side (HCC)  Unsteadiness on feet  Other lack of coordination    Problem List Patient Active Problem List   Diagnosis Date Noted  . Impaired functional mobility, balance, gait, and endurance 08/23/2019  . Amputation of great toe ([place]) 08/23/2019  . 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 Impaired ambulation 06/11/2019  . Dependent on walker for ambulation 04/30/2019  . Right hemiparesis ([place]) 04/30/2019  . Tachycardia 04/30/2019  . 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 Acute blood loss anemia   . Uncontrolled type 2 diabetes mellitus with hyperglycemia ([place])   . Dry gangrene ([place])   . Leukocytosis   . Embolic stroke ([place]) 16/10/9602  . Status post tracheostomy ([place])   . Pressure injury of skin 03/02/2019  . On mechanically assisted ventilation ([place])   . Goals of care, counseling/discussion   . Palliative care encounter   . ARDS (adult respiratory distress syndrome) ([place])   . Cerebral embolism with cerebral infarction 02/17/2019  . Acute respiratory failure with hypoxia ([place])   . Pneumothorax, right     Zachery Conch MOT, OTR/L  10/08/2019, 3:11 PM  [facility] [address] [place], [place], [address] Phone: [phone]   Fax:  [phone]  Name: Andrew Bruce MRN: [id number] Date of Birth: 1965-02-08

## 2019-10-08 NOTE — Therapy (Signed)
[facility] [address] [place], [place], [address] Phone: [phone]   Fax:  [phone]  Physical Therapy Treatment  Patient Details  Name: Andrew Bruce MRN: [id number] Date of Birth: 1965/02/26 Referring Provider (PT): Genice Rouge, MD   Encounter Date: 10/08/2019   PT End of Session - 10/08/19 1632    Visit Number 6    Number of Visits 9    Date for PT Re-Evaluation 12/02/19   POC for 8 weeks, Cert for 90 days   Authorization Type UHC Medicaid (27 visit limit)    Authorization Time Period no auth required currently    Progress Note Due on Visit 8    PT Start Time 1445    PT Stop Time 1530    PT Time Calculation (min) 45 min    Equipment Utilized During Treatment Gait belt    Activity Tolerance Patient tolerated treatment well    Behavior During Therapy WFL for tasks assessed/performed   Tearful          Past Medical History:  Diagnosis Date  . COVID-19 02/06/2019  . Diabetes mellitus without complication (HCC)   . Hypertension   . 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 Pneumonia    with Covid  . Stroke [facility])    weakness on right side, occurred while he had Covid    Past Surgical History:  Procedure Laterality Date  . AMPUTATION Right 08/13/2019   Procedure: AMPUTATION RIGHT FIRST AND SECOND TOES;  Surgeon: Larina Earthly, MD;  [facility];  Service: Vascular;  Laterality: Right;  . DIRECT LARYNGOSCOPY Bilateral 04/06/2019   Procedure: MICRO DIRECT LARYNGOSCOPY WITH PROLARYN INJECTION;  Surgeon: Christia Reading, MD;  Location: [facility] OR;  Service: ENT;  Laterality: Bilateral;  . IR GASTROSTOMY TUBE MOD SED  03/10/2019  . IR GASTROSTOMY TUBE REMOVAL  04/20/2019  . WOUND DEBRIDEMENT Left 08/13/2019   Procedure: AMPUTATION  OF LEFT FIRST TOE, AND DEBRIDEMENT OF LEFT SECOND, THIRD AND FOURTH TOES;  Surgeon: Larina Earthly, MD;  Location: [facility] OR;   Service: Vascular;  Laterality: Left;    There were no vitals filed for this visit.   Subjective Assessment - 10/08/19 1628    Subjective Pt reports that he has been having pain bil calves for past 4 days. Pain is not changed with activitiy. Pain can be worst at night.    Patient is accompained by: Interpreter   in person interpreter, [name]   Pertinent History COVID infection, hx of CVA (January 2021), hx of PE, gangerene of toes    Limitations Standing;Walking    Currently in Pain? Yes    Pain Location Leg    Pain Orientation Right;Left    Pain Descriptors / Indicators Aching;Tightness    Pain Onset In the past 7 days    Pain Frequency Constant               Nustep: level 5 for 10' pt reported slight improvement in pain afterwards.   Manual therapy: Grade IV cuboid mobilization, subtalar eversion mobilization, mid foot mobilization  See clinical impression statement for further details of the session.                     PT Short Term Goals - 09/03/19 1535      PT SHORT TERM GOAL #1   Title Patient will be independent with initial HEP for  Balance/Strengthening (All STG due on 4th Visit)    Baseline no HEP established    Time 4    Period Weeks    Status New    Target Date 10/01/19   after 4 visits     PT SHORT TERM GOAL #2   Title Patient will report understanding of fall prevention strategies    Baseline dependent    Time 4    Period Weeks    Status New      PT SHORT TERM GOAL #3   Title Patient will demo ability to ambulate >500 ft on indoor surfaces, Mod I with no AD for improved ambulation    Baseline 115 ft supervision    Time 4    Period Weeks    Status New             PT Long Term Goals - 09/03/19 1537      PT LONG TERM GOAL #1   Title Patient will be independent with Final HEP for Balance/Strengthening (All LTG due after 8th Visit)    Baseline no HEP established    Time 8    Period Weeks    Status New    Target Date  10/29/19      PT LONG TERM GOAL #2   Title Patient will improve Berg Balance to >/= 52/56 to demonstrate reduced fall risk and improved balance    Baseline 45/56    Time 8    Period Weeks    Status New      PT LONG TERM GOAL #3   Title Patient will demo ability to ambulate >1000 ft on indoor/outdoor surfaces with no AD at Mod I    Baseline 115 supervision    Time 8    Period Weeks    Status New      PT LONG TERM GOAL #4   Title Patient will demo ability to ascend/descend 12 stairs at Mod I level for improved ambulation within home    Baseline no baseline established    Time 8    Period Weeks    Status New      PT LONG TERM GOAL #5   Title Patient will report being able to stand for >10 minutes with no increase in pain in bilateral feet    Baseline 3/10 pain with standing    Time 8    Period Weeks    Status New                 Plan - 10/08/19 1629    Clinical Impression Statement Today's session was focused on evaluating for patient's bil calf pain that started 4 days ago. patient has no abnormal swelling, redness or warmth in bil loewr legs. There was negative Homan's sign bil. Pt felt increased pain with stretch of calf muscles. Upon further investigating, patient reported that he got new shoes with new orthotics about 4 days ago. Pain started about the same time. Patient did not go through proper break in period for new shoe and orthotics. We mobilized R foot to improve foot and ankle mechanics. At the end of the session, pt reported no pain in R calf. Pt was advised to start with wearing his new shoe and new orthotcis for 1 hour and adding 1 hour every day as tolerated. If it is painful to progress, he should continue at the previous wear time for few more days before progressing.    Personal Factors and Comorbidities Comorbidity 2;Time since onset  of injury/illness/exacerbation;Transportation    Comorbidities Hx of CVA, Gangerene of toes, COVID - 19     Examination-Activity Limitations Squat;Stairs;Stand;Locomotion Level    Examination-Participation Restrictions Community Activity;Driving;Yard Work;Shop    Stability/Clinical Decision Making Evolving/Moderate complexity    Rehab Potential Fair    PT Frequency 1x / week    PT Duration 8 weeks    PT Treatment/Interventions ADLs/Self Care Home Management;Gait training;Stair training;Functional mobility training;Therapeutic exercise;Therapeutic activities;Balance training;Patient/family education;Orthotic Fit/Training;Passive range of motion;Manual techniques;Energy conservation;Visual/perceptual remediation/compensation;Joint Manipulations;DME Instruction;Neuromuscular re-education    PT Next Visit Plan Continue Gait Training on varied surfaces, Educate on Fall Prevention,  Continue to progress balance.    Consulted and Agree with Plan of Care Patient           Patient will benefit from skilled therapeutic intervention in order to improve the following deficits and impairments:  Abnormal gait, Decreased balance, Decreased mobility, Decreased endurance, Decreased range of motion, Decreased strength, Difficulty walking, Impaired sensation, Pain, Decreased activity tolerance, Decreased knowledge of use of DME  Visit Diagnosis: Other abnormalities of gait and mobility  Muscle weakness (generalized)  Hemiplegia and hemiparesis following cerebral infarction affecting right dominant side (HCC)  Unsteadiness on feet  Other lack of coordination  Right hemiparesis [facility])     Problem List Patient Active Problem List   Diagnosis Date Noted  . Impaired functional mobility, balance, gait, and endurance 08/23/2019  . Amputation of great toe (HCC) 08/23/2019  . 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 Impaired ambulation 06/11/2019  . Dependent on walker for ambulation 04/30/2019  . Right hemiparesis (HCC) 04/30/2019  . Tachycardia 04/30/2019  . 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 Acute blood loss anemia   . Uncontrolled type 2 diabetes mellitus with hyperglycemia (HCC)   . Dry gangrene (HCC)   . Leukocytosis   . 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 Status post tracheostomy (HCC)   . Pressure injury of skin 03/02/2019  . On mechanically assisted ventilation (HCC)   . Goals of care, counseling/discussion   . Palliative care encounter   . ARDS (adult respiratory distress syndrome) (HCC)   . Cerebral embolism with cerebral infarction 02/17/2019  . Acute respiratory failure with hypoxia (HCC)   . Pneumothorax, right     Ileana Ladd, PT 10/08/2019, 4:36 PM  [facility] [address] [place], [place], [address] Phone: [phone]   Fax:  [phone]  Name: Andrew Bruce MRN: [id number] Date of Birth: 1965/06/22

## 2019-10-12 ENCOUNTER — Ambulatory Visit: Payer: Medicaid Other

## 2019-10-12 ENCOUNTER — Encounter: Payer: Medicaid Other | Admitting: Occupational Therapy

## 2019-10-14 ENCOUNTER — Encounter: Payer: Medicaid Other | Admitting: Speech Pathology

## 2019-10-14 ENCOUNTER — Ambulatory Visit: Payer: Medicaid Other

## 2019-10-18 ENCOUNTER — Encounter: Payer: Medicaid Other | Admitting: Physical Medicine and Rehabilitation

## 2019-10-19 ENCOUNTER — Encounter: Payer: Medicaid Other | Admitting: Occupational Therapy

## 2019-10-19 ENCOUNTER — Encounter: Payer: Self-pay | Admitting: Occupational Therapy

## 2019-10-19 ENCOUNTER — Ambulatory Visit: Payer: Medicaid Other | Admitting: Occupational Therapy

## 2019-10-19 ENCOUNTER — Other Ambulatory Visit: Payer: Self-pay

## 2019-10-19 ENCOUNTER — Ambulatory Visit: Payer: Medicaid Other

## 2019-10-19 ENCOUNTER — Ambulatory Visit: Payer: Medicaid Other | Attending: Physical Medicine and Rehabilitation

## 2019-10-19 DIAGNOSIS — R278 Other lack of coordination: Secondary | ICD-10-CM | POA: Insufficient documentation

## 2019-10-19 DIAGNOSIS — R2681 Unsteadiness on feet: Secondary | ICD-10-CM

## 2019-10-19 DIAGNOSIS — R2689 Other abnormalities of gait and mobility: Secondary | ICD-10-CM | POA: Insufficient documentation

## 2019-10-19 DIAGNOSIS — M6281 Muscle weakness (generalized): Secondary | ICD-10-CM | POA: Insufficient documentation

## 2019-10-19 DIAGNOSIS — R208 Other disturbances of skin sensation: Secondary | ICD-10-CM | POA: Diagnosis present

## 2019-10-19 DIAGNOSIS — I69351 Hemiplegia and hemiparesis following cerebral infarction affecting right dominant side: Secondary | ICD-10-CM | POA: Insufficient documentation

## 2019-10-19 NOTE — Therapy (Signed)
[facility] [address] [place], [place], [address] Phone: [phone]   Fax:  [phone]  Physical Therapy Treatment  Patient Details  Name: Andrew ShamesLeonel Nicks MRN: [id number] Date of Birth: Aug 12, 1965 Referring Provider (PT): Genice RougeMegan [name], MD   Encounter Date: 10/19/2019   PT End of Session - 10/19/19 1629    Visit Number 7    Number of Visits 9    Date for PT Re-Evaluation 12/02/19   POC for 8 weeks, Cert for 90 days   Authorization Type UHC Medicaid (27 visit limit)    Authorization Time Period no auth required currently    Progress Note Due on Visit 8    PT Start Time 1623   pt using bathroom prior to session   PT Stop Time 1658    PT Time Calculation (min) 35 min    Equipment Utilized During Treatment Gait belt    Activity Tolerance Patient tolerated treatment well    Behavior During Therapy WFL for tasks assessed/performed   Tearful          Past Medical History:  Diagnosis Date  . COVID-19 02/06/2019  . Diabetes mellitus without complication (HCC)   . Hypertension   . 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 Pneumonia    with Covid  . Stroke [facility](HCC)    weakness on right side, occurred while he had Covid    Past Surgical History:  Procedure Laterality Date  . AMPUTATION Right 08/13/2019   Procedure: AMPUTATION RIGHT FIRST AND SECOND TOES;  Surgeon: Larina EarthlyEarly, Todd F, MD;  Location: [facility] OR;  Service: Vascular;  Laterality: Right;  . DIRECT LARYNGOSCOPY Bilateral 04/06/2019   Procedure: MICRO DIRECT LARYNGOSCOPY WITH PROLARYN INJECTION;  Surgeon: Christia ReadingBates, Dwight, MD;  [facility];  Service: ENT;  Laterality: Bilateral;  . IR GASTROSTOMY TUBE MOD SED  03/10/2019  . IR GASTROSTOMY TUBE REMOVAL  04/20/2019  . WOUND DEBRIDEMENT Left 08/13/2019   Procedure: AMPUTATION  OF LEFT FIRST TOE, AND DEBRIDEMENT OF LEFT SECOND, THIRD AND FOURTH TOES;  Surgeon:  Larina EarthlyEarly, Todd F, MD;  Location: [facility] OR;  Service: Vascular;  Laterality: Left;    There were no vitals filed for this visit.   Subjective Assessment - 10/19/19 1624    Subjective Patient reports feeling better since last session, still continues to report some pain in the calves. Patient reports that he is only wearing the orthotics for 3 hrs at a time1x/day. Patient has no swelling in BLE/redness/temperature changes. No falls to report.    Patient is accompained by: Interpreter   in person interpreter, Renae Ficklepaul   Pertinent History COVID infection, hx of CVA (January 2021), hx of PE, gangerene of toes    Limitations Standing;Walking    Currently in Pain? Yes    Pain Score 4     Pain Location Calf    Pain Orientation Right;Left    Pain Type Chronic pain    Pain Onset In the past 7 days                             [facility] Adult PT Treatment/Exercise - 10/19/19 0001      Transfers   Transfers Sit to Stand;Stand to Sit    Sit to Stand 5: Supervision    Stand to Sit 5: Supervision      Ambulation/Gait   Ambulation/Gait Yes  Ambulation/Gait Assistance 6: Modified independent (Device/Increase time)    Ambulation/Gait Assistance Details Competed ambulation x 545 ft without AD at Mod I level on level surfaces, no instances of imbalance noted. Patient demo improved gait pattern and improved step length.      Ambulation Distance (Feet) 545 Feet    Assistive device None    Gait Pattern Step-through pattern    Ambulation Surface Level;Indoor      Self-Care   Self-Care Other Self-Care Comments    Other Self-Care Comments  PT educating on fall prevention strategies at home to further reduce fall risk. PT unable to get handout in spanish during session. Patient requesting it in english, and will have daughter to review it at home with him again. Handout provided.                Balance Exercises - 10/19/19 0001      Balance Exercises: Standing   Standing Eyes Closed Narrow  base of support (BOS);Head turns;Foam/compliant surface;3 reps;30 secs;Limitations    Standing Eyes Closed Limitations completed standing with narrow BOS on airex, 3 x 30 seconds with eyes closed. Progressed to completing horizontal/vertical head turns 1 x 10 reps each with eyes closed, increased balance challenge noted.     Sidestepping Foam/compliant support;2 reps;Limitations    Sidestepping Limitations completd side stepping on blue balance beam, down and back x 2 reps without UE support.              PT Education - 10/19/19 1659    Education Details progress toward STG; fall prevention handout (provided in english, patient report daughter will review with him at home again)    Person(s) Educated Patient    Methods Explanation;Handout    Comprehension Verbalized understanding            PT Short Term Goals - 10/19/19 1629      PT SHORT TERM GOAL #1   Title Patient will be independent with initial HEP for Balance/Strengthening (All STG due on 4th Visit)    Baseline reports independence with HEP    Time 4    Period Weeks    Status Achieved    Target Date 10/01/19   after 4 visits     PT SHORT TERM GOAL #2   Title Patient will report understanding of fall prevention strategies    Baseline educated/provided handout    Time 4    Period Weeks    Status Achieved      PT SHORT TERM GOAL #3   Title Patient will demo ability to ambulate >500 ft on indoor surfaces, Mod I with no AD for improved ambulation    Baseline 545 ft Mod I    Time 4    Period Weeks    Status Achieved             PT Long Term Goals - 09/03/19 1537      PT LONG TERM GOAL #1   Title Patient will be independent with Final HEP for Balance/Strengthening (All LTG due after 8th Visit)    Baseline no HEP established    Time 8    Period Weeks    Status New    Target Date 10/29/19      PT LONG TERM GOAL #2   Title Patient will improve Berg Balance to >/= 52/56 to demonstrate reduced fall risk and  improved balance    Baseline 45/56    Time 8    Period Weeks    Status  New      PT LONG TERM GOAL #3   Title Patient will demo ability to ambulate >1000 ft on indoor/outdoor surfaces with no AD at Mod I    Baseline 115 supervision    Time 8    Period Weeks    Status New      PT LONG TERM GOAL #4   Title Patient will demo ability to ascend/descend 12 stairs at Mod I level for improved ambulation within home    Baseline no baseline established    Time 8    Period Weeks    Status New      PT LONG TERM GOAL #5   Title Patient will report being able to stand for >10 minutes with no increase in pain in bilateral feet    Baseline 3/10 pain with standing    Time 8    Period Weeks    Status New                 Plan - 10/19/19 1657    Clinical Impression Statement Today's skilled PT session included assessment of patient's progress toward all STG's. Patient able to meet all STG's today during session demonstrating compliance with HEP and improved ambulation. PT providing handout for patient to review with daughter, as PT unable to translate to Spanish during session. Continued balance exercises as tolerated by patient. Will continue to progress toward all LTGs.    Personal Factors and Comorbidities Comorbidity 2;Time since onset of injury/illness/exacerbation;Transportation    Comorbidities Hx of CVA, Gangerene of toes, COVID - 19    Examination-Activity Limitations Squat;Stairs;Stand;Locomotion Level    Examination-Participation Restrictions Community Activity;Driving;Yard Work;Shop    Stability/Clinical Decision Making Evolving/Moderate complexity    Rehab Potential Fair    PT Frequency 1x / week    PT Duration 8 weeks    PT Treatment/Interventions ADLs/Self Care Home Management;Gait training;Stair training;Functional mobility training;Therapeutic exercise;Therapeutic activities;Balance training;Patient/family education;Orthotic Fit/Training;Passive range of motion;Manual  techniques;Energy conservation;Visual/perceptual remediation/compensation;Joint Manipulations;DME Instruction;Neuromuscular re-education    PT Next Visit Plan Continue balance exercises (focused on vision removed/complaint surfaces) assess foot mechanics? If pt is continuing to have pain, follow up with PCP.    Consulted and Agree with Plan of Care Patient           Patient will benefit from skilled therapeutic intervention in order to improve the following deficits and impairments:  Abnormal gait, Decreased balance, Decreased mobility, Decreased endurance, Decreased range of motion, Decreased strength, Difficulty walking, Impaired sensation, Pain, Decreased activity tolerance, Decreased knowledge of use of DME  Visit Diagnosis: Other abnormalities of gait and mobility  Muscle weakness (generalized)  Hemiplegia and hemiparesis following cerebral infarction affecting right dominant side (HCC)  Unsteadiness on feet     Problem List Patient Active Problem List   Diagnosis Date Noted  . Impaired functional mobility, balance, gait, and endurance 08/23/2019  . Amputation of great toe (HCC) 08/23/2019  . 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 Impaired ambulation 06/11/2019  . Dependent on walker for ambulation 04/30/2019  . Right hemiparesis (HCC) 04/30/2019  . Tachycardia 04/30/2019  . 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 Acute blood loss anemia   . Uncontrolled type 2 diabetes mellitus with hyperglycemia (HCC)   . Dry gangrene (HCC)   . Leukocytosis   . 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 Status post tracheostomy (HCC)   . Pressure injury of skin 03/02/2019  . On mechanically assisted ventilation (HCC)   . Goals  of care, counseling/discussion   . Palliative care encounter   . ARDS (adult respiratory distress syndrome) (HCC)   . Cerebral embolism with cerebral infarction 02/17/2019  . Acute respiratory failure with hypoxia (HCC)   . Pneumothorax,  right     Tempie Donning, PT, DPT 10/19/2019, 7:07 PM  [facility] [address] [place], [place], [address] Phone: [phone]   Fax:  [phone]  Name: Keyonte Cookston MRN: [id number] Date of Birth: 03/04/1965

## 2019-10-19 NOTE — Therapy (Signed)
[facility] [address] [place], [place], [address] Phone: [phone]   Fax:  [phone]  Occupational Therapy Treatment  Patient Details  Name: Andrew Bruce MRN: [id number] Date of Birth: 03/12/1965 Referring Provider (OT): Dr. Courtney Heys   Encounter Date: 10/19/2019   OT End of Session - 10/19/19 1538    Visit Number 4    Number of Visits 9    Date for OT Re-Evaluation 11/08/19    Authorization Type UHC Medicaid, 27 visit limit combined (used 4 previously)    Authorization Time Period week 3/8 (10/5)    Authorization - Visit Number 4    Authorization - Number of Visits 8    OT Start Time 9381    OT Stop Time 1615    OT Time Calculation (min) 40 min    Activity Tolerance Patient tolerated treatment well    Behavior During Therapy [facility] for tasks assessed/performed           Past Medical History:  Diagnosis Date  . COVID-19 02/06/2019  . Diabetes mellitus without complication ([place])   . Hypertension   . PE (pulmonary thromboembolism) ([place])    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  . Pneumonia    with Covid  . Stroke [facility])    weakness on right side, occurred while he had Covid    Past Surgical History:  Procedure Laterality Date  . AMPUTATION Right 08/13/2019   Procedure: AMPUTATION RIGHT FIRST AND SECOND TOES;  Surgeon: Rosetta Posner, MD;  Location: [place];  Service: Vascular;  Laterality: Right;  . DIRECT LARYNGOSCOPY Bilateral 04/06/2019   Procedure: MICRO DIRECT LARYNGOSCOPY WITH PROLARYN INJECTION;  Surgeon: Melida Quitter, MD;  Location: [place];  Service: ENT;  Laterality: Bilateral;  . IR GASTROSTOMY TUBE MOD SED  03/10/2019  . IR GASTROSTOMY TUBE REMOVAL  04/20/2019  . WOUND DEBRIDEMENT Left 08/13/2019   Procedure: AMPUTATION  OF LEFT FIRST TOE, AND DEBRIDEMENT OF LEFT SECOND, THIRD AND FOURTH TOES;  Surgeon: Rosetta Posner, MD;  Location: [place];  Service: Vascular;  Laterality:  Left;    There were no vitals filed for this visit.   Subjective Assessment - 10/19/19 1534    Subjective  Pt reports numbness on the toes. Pt reports he is doing his HEP at home and they are going well.    Pertinent History CVA w/ mild Rt hemiparesis secondary to COVID and ARF Jan 2021. Pt intubated 02/07/19 and trachecd 09/12/91 - ICU complicated by PE and multiple CVA'S, gangrene of toes Lt 1-4 and Rt 1, 2, 3, 5 s/p amputation of R 1,2 toes and L 1st toe    Limitations fall risk    Patient Stated Goals be able to write better, walk better, reach higher    Currently in Pain? Yes    Pain Score 3     Pain Location Calf    Pain Orientation Right;Left    Pain Descriptors / Indicators Shooting    Pain Type Chronic pain    Pain Onset More than a month ago    Pain Frequency Constant            Treatment: Pt completed resistance clothespins with 1-8# resistance with RUE. Reports no pain with approx 140 degrees shoulder flexion.   Nuts and Bolts - min difficulty with vision occluded.  Retrieve and Replace 3 and 4 lb dumbbell in overhead cabinet - no difficulty and no report of pain  Manipulation of pennies and coins. Pt reports index and thumb fingers (pincer/tip) feel like "glue"  Grooved Pegboard - min drops and difficulty with manipulation and rotating pegs     OT Education - 10/19/19 1609    Education Details education on goals being met and possible discharge from OT after scheduled visits.    Person(s) Educated Patient    Methods Explanation    Comprehension Verbalized understanding;Verbal cues required            OT Short Term Goals - 10/19/19 1540      OT SHORT TERM GOAL #1   Title Pt will be independent with updated HEP for RUE strength and coordination.--check STGs 10/09/19    Baseline needs updates to previous HEP for progression    Time 4    Period Weeks    Status Achieved   pt has been consistently doing HEP - supervision with exercises 10/08/19     OT SHORT  TERM GOAL #2   Title Pt will demo at least 120* R shoulder flex for functional reaching without pain.    Baseline 115*    Time 4    Period Weeks    Status Achieved   126* R shoulder flexion 10/08/19 pain 2/10     OT SHORT TERM GOAL #3   Title Pt will demo at least 40lbs R grip strength to assist with opening containers/lifting objects.    Baseline R- 36lbs, L-56.6lbs    Time 4    Period Weeks    Status Achieved   42.3 lbs RUE 10/08/19     OT SHORT TERM GOAL #4   Title Pt will improve R lateral pinch strength to at least 12lbs to assist with ADLs/opening packages.    Baseline R-9lbs, L-16lbs    Time 4    Period Weeks    Status Achieved   12 lbs RUE lateral pinch 10/08/19     OT SHORT TERM GOAL #5   Title --             OT Long Term Goals - 10/19/19 1541      OT LONG TERM GOAL #1   Title Pt wil improve RUE strength/ROM as shown by being able to retrieve/replace 3lb object on overhead shelf safely.    Baseline unable    Time 8    Period Weeks    Status Achieved   3-4# dumbbells to and from [place] #2   Title Pt will demo at least 125* R shoulder flex for functional reaching without pain.    Baseline 115*    Time 8    Period Weeks    Status Achieved      OT LONG TERM GOAL #3   Title Pt will demo at least 45lbs R grip strength to assist with opening containers/gripping objects.    Baseline R-36lbs, L 56lbs    Time 8    Period Weeks    Status On-going   44.5 lbs RUE 10/19/19     OT LONG TERM GOAL #4   Title Pt will demo at least 9lbs R tip pinch strength to assist with ADLs/holding objects for IADLs.    Baseline R-5lbs, L-13lbs    Time 8    Period Weeks    Status Achieved   11lb 10/19/19     OT LONG TERM GOAL #5   Title --  Plan - 10/19/19 1544    Clinical Impression Statement Pt is progressing towards goals. Pt has met all of his STGs and is working towards meeting [place].    OT Occupational Profile and History Detailed  Assessment- Review of Records and additional review of physical, cognitive, psychosocial history related to current functional performance    Occupational performance deficits (Please refer to evaluation for details): ADL's;IADL's;Work;Leisure    Body Structure / Function / Physical Skills ADL;ROM;IADL;Endurance;Body mechanics;Sensation;Mobility;Flexibility;Strength;Coordination;FMC;Pain;UE functional use;Decreased knowledge of use of DME;Dexterity    Rehab Potential Good    Clinical Decision Making Several treatment options, min-mod task modification necessary    Comorbidities Affecting Occupational Performance: Presence of comorbidities impacting occupational performance    Comorbidities impacting occupational performance description: long Covid, toe amputation    Modification or Assistance to Complete Evaluation  Min-Moderate modification of tasks or assist with assess necessary to complete eval    OT Frequency 1x / week    OT Duration 8 weeks   +eval   OT Treatment/Interventions Self-care/ADL training;Therapeutic exercise;Functional Mobility Training;Aquatic Therapy;Neuromuscular education;Manual Therapy;Energy conservation;Therapeutic activities;DME and/or AE instruction;Moist Heat;Passive range of motion;Patient/family education;Fluidtherapy    Plan functional reaching with dynamic balance, grip strength    Consulted and Agree with Plan of Care Patient           Patient will benefit from skilled therapeutic intervention in order to improve the following deficits and impairments:   Body Structure / Function / Physical Skills: ADL, ROM, IADL, Endurance, Body mechanics, Sensation, Mobility, Flexibility, Strength, Coordination, FMC, Pain, UE functional use, Decreased knowledge of use of DME, Dexterity       Visit Diagnosis: Other abnormalities of gait and mobility  Muscle weakness (generalized)  Hemiplegia and hemiparesis following cerebral infarction affecting right dominant side  (HCC)  Unsteadiness on feet  Other lack of coordination    Problem List Patient Active Problem List   Diagnosis Date Noted  . Impaired functional mobility, balance, gait, and endurance 08/23/2019  . Amputation of great toe ([place]) 08/23/2019  . 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 Impaired ambulation 06/11/2019  . Dependent on walker for ambulation 04/30/2019  . Right hemiparesis ([place]) 04/30/2019  . Tachycardia 04/30/2019  . 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 Acute blood loss anemia   . Uncontrolled type 2 diabetes mellitus with hyperglycemia ([place])   . Dry gangrene ([place])   . Leukocytosis   . Embolic stroke ([place]) 30/07/6224  . Status post tracheostomy ([place])   . Pressure injury of skin 03/02/2019  . On mechanically assisted ventilation ([place])   . Goals of care, counseling/discussion   . Palliative care encounter   . ARDS (adult respiratory distress syndrome) ([place])   . Cerebral embolism with cerebral infarction 02/17/2019  . Acute respiratory failure with hypoxia ([place])   . Pneumothorax, right     Zachery Conch MOT, OTR/L  10/19/2019, 4:16 PM  [facility] [address] [place], [place], [address] Phone: [phone]   Fax:  [phone]  Name: Andrew Bruce MRN: [id number] Date of Birth: [birth year]

## 2019-10-19 NOTE — Patient Instructions (Signed)
FALLS AT HOME Each year, thousands of older Americans fall at home. Many of them are seriously injured, and some are  disabled. In 2002, more than 12,800 people over age 53 died and 1.6 million were treated in emergency  departments because of falls. Falls are often due to hazards that are easy to overlook but easy to fix.  This checklist will help you find and fix those hazards in your home. The checklist asks about hazards  found in each room of your home. For each hazard, the checklist tells you how to fix the problem. At the  end of the checklist, you'll find other tips for preventing falls. FLOORS: Look at the floor in each room. Q: When you walk through a room, do you have to walk around furniture?  ? Ask someone to move the furniture so your path is clear. Q: Do you have throw rugs on the floor?  ? Remove the rugs or use double-sided tape or a non-slip backing so the rugs won't slip. Q: Are there papers, books, towels, shoes, magazines, boxes, blankets, or other objects on the floor? ? Pick up things that are on the floor. Always keep objects off the floor. Q: Do you have to walk over or around wires or cords (like lamp, telephone, or extension cords)?  ? Coil or tape cords and wires next to the wall so you can't trip over them. If needed, have an   electrician put in another outlet. STAIRS AND STEPS: Look at the stairs you use both inside and outside your home.  Q: Are there papers, shoes, books, or other objects on the stairs?  ? Pick up things on the stairs. Always keep objects off stairs. Q: Are some steps broken or uneven?  ? Fix loose or uneven steps.  Q: Are you missing a light over the stairway?  ? Have an electrician put in an over-head light at the top and bottom of the stairs. Q: Do you have only one light switch for your stairs (only at the top or at the bottom of the stairs)?  ? Have an electrician put in a light switch at the top and bottom of the stairs. You can get  light   switches that glow.  Q: Has the stairway light bulb burned out?  ? Have a friend or family member change the light bulb.Q: Is the carpet on the steps loose or torn?  ? Make sure the carpet is firmly attached to every step, or remove the carpet and attach nonslip rubber treads to the stairs.  Q: Are the handrails loose or broken? Is there a handrail on only one side of the stairs? ? Fix loose handrails or put in new ones. Make sure handrails are on both sides of the stairs and   are as long as the stairs. KITCHEN: Look at your kitchen and eating area. Q: Are the things you use often on high shelves?  ? Move items in your cabinets. Keep things you use often on the lower shelves (about waist   level). Q: Is your step stool unsteady?  ? If you must use a step stool, get one with a bar to hold on to. Never use a chair as a step   stool. BATHROOMS: Look at all your bathrooms. Q: Is the tub or shower floor slippery?  ? Put a non-slip rubber mat or self-stick strips on the floor of the tub or shower. Q: Do you need some support when you  get in and out of the tub or up from the toilet?  ? Have a carpenter put grab bars inside the tub and next to the toilet. BEDROOMS: Look at all your bedrooms. Q: Is the light near the bed hard to reach?  ? Place a lamp close to the bed where it's easy to reach. Q: Is the path from your bed to the bathroom dark?  ? Put in a night-light so you can see where you're walking. Some night-lights go on by   themselves after dark. Q: Is the path from your bed to the bathroom dark?  ? Put in a night-light so you can see where you're walking. Some night-lights go on by   themselves after dark.Other Things You Can Do to Prevent Falls . Exercise regularly. Exercise makes you stronger and improves your balance and coordination.  . Have your doctor or pharmacist look at all the medicines you take, even over-the-counter  medicines. Some medicines can make you sleepy or  dizzy.  Marland Kitchen Have your vision checked at least once a year by an eye doctor. Poor vision can increase your  risk of falling.  . Get up slowly after you sit or lie down.  . Wear shoes both inside and outside the house. Avoid going barefoot or wearing slippers.  . Improve the lighting in your home. Put in brighter light bulbs. Florescent bulbs are bright and  cost less to use.  . It's safest to have uniform lighting in a room. Add lighting to dark areas.  Sherri Rad lightweight curtains or shades to reduce glare.  . Paint a contrasting color on the top edge of all steps so you can see the stairs better. For  example, use a light color paint on dark wood. Other Safety Tips  . Keep emergency numbers in large print near each phone.  . Put a phone near the floor in case you fall and can't get up.  . Think about wearing an alarm device that will bring help in case you fall and can't get up

## 2019-10-21 ENCOUNTER — Ambulatory Visit: Payer: Medicaid Other

## 2019-10-21 ENCOUNTER — Encounter: Payer: Medicaid Other | Admitting: Occupational Therapy

## 2019-10-26 ENCOUNTER — Ambulatory Visit: Payer: Medicaid Other

## 2019-10-26 ENCOUNTER — Other Ambulatory Visit: Payer: Self-pay

## 2019-10-26 ENCOUNTER — Encounter: Payer: Medicaid Other | Admitting: Occupational Therapy

## 2019-10-26 ENCOUNTER — Encounter: Payer: Self-pay | Admitting: Occupational Therapy

## 2019-10-26 ENCOUNTER — Ambulatory Visit: Payer: Medicaid Other | Admitting: Occupational Therapy

## 2019-10-26 DIAGNOSIS — M6281 Muscle weakness (generalized): Secondary | ICD-10-CM

## 2019-10-26 DIAGNOSIS — R2689 Other abnormalities of gait and mobility: Secondary | ICD-10-CM

## 2019-10-26 DIAGNOSIS — R2681 Unsteadiness on feet: Secondary | ICD-10-CM

## 2019-10-26 DIAGNOSIS — R208 Other disturbances of skin sensation: Secondary | ICD-10-CM

## 2019-10-26 DIAGNOSIS — R278 Other lack of coordination: Secondary | ICD-10-CM

## 2019-10-26 DIAGNOSIS — I69351 Hemiplegia and hemiparesis following cerebral infarction affecting right dominant side: Secondary | ICD-10-CM

## 2019-10-26 NOTE — Patient Instructions (Signed)
1. Grip Strengthening (Resistive Putty)   Squeeze putty using thumb and all fingers. Repeat _20___ times. Do __2__ sessions per day.   2. Roll putty into tube on table and pinch between each finger and thumb x 10 reps each. (can do ring and small finger together)     Copyright  VHI. All rights reserved.   

## 2019-10-26 NOTE — Therapy (Signed)
[facility] [address] [place], [place], [address] Phone: [phone]   Fax:  [phone]  Physical Therapy Treatment  Patient Details  Name: Andrew Bruce MRN: [id number] Date of Birth: 08-27-1965 Referring Provider (PT): Genice Rouge, MD   Encounter Date: 10/26/2019   PT End of Session - 10/26/19 1903    Visit Number 8    Number of Visits 9    Date for PT Re-Evaluation 12/02/19   POC for 8 weeks, Cert for 90 days   Authorization Type UHC Medicaid (27 visit limit)    Authorization Time Period no auth required currently    Progress Note Due on Visit 8    PT Start Time 1615    PT Stop Time 1700    PT Time Calculation (min) 45 min    Equipment Utilized During Treatment Gait belt    Activity Tolerance Patient tolerated treatment well    Behavior During Therapy WFL for tasks assessed/performed   Tearful          Past Medical History:  Diagnosis Date  . COVID-19 02/06/2019  . Diabetes mellitus without complication (HCC)   . Hypertension   . 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 Pneumonia    with Covid  . Stroke [facility])    weakness on right side, occurred while he had Covid    Past Surgical History:  Procedure Laterality Date  . AMPUTATION Right 08/13/2019   Procedure: AMPUTATION RIGHT FIRST AND SECOND TOES;  Surgeon: Larina Earthly, MD;  Location: [facility] OR;  Service: Vascular;  Laterality: Right;  . DIRECT LARYNGOSCOPY Bilateral 04/06/2019   Procedure: MICRO DIRECT LARYNGOSCOPY WITH PROLARYN INJECTION;  Surgeon: Christia Reading, MD;  Location: [facility] OR;  Service: ENT;  Laterality: Bilateral;  . IR GASTROSTOMY TUBE MOD SED  03/10/2019  . IR GASTROSTOMY TUBE REMOVAL  04/20/2019  . WOUND DEBRIDEMENT Left 08/13/2019   Procedure: AMPUTATION  OF LEFT FIRST TOE, AND DEBRIDEMENT OF LEFT SECOND, THIRD AND FOURTH TOES;  Surgeon: Larina Earthly, MD;  Location: [facility] OR;   Service: Vascular;  Laterality: Left;    There were no vitals filed for this visit.   Subjective Assessment - 10/26/19 1858    Subjective Pt reports he has 4/10 pain in his calf, shins and foot on both side. His feet feel heavy and numb. Pt reports that he hasn't looked at his feet since surgery. He takes shower in his bathtub with socks on. When asked, pt reported that he can feel his left big toe (amputated0    Patient is accompained by: Interpreter   in person interpreter, [name]   Pertinent History COVID infection, hx of CVA (January 2021), hx of PE, gangerene of toes    Limitations Standing;Walking    Currently in Pain? 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 Rubbing cloth lightly in different motions of ankle and foot. Starting proximally and working distally (less sensitiveto more sensitive). Gradually starting with slow speed of rubbing and increasing as tolerated. His progression should not increase his pain level (very important and emphasized with patient)  Pt was hesitant to look athis foot but with education during the session, he was able to see his foot for the first time after his surgery today. We started with showing him to picture of big toe ampuation and hwat foot likes (a picture from google search(. When he was ready he was  able to look at his foot. He has an emotional response which pt was educated was normal as this will help him process thorugh this difficult acceptance period. Pt was educated that he needs to be comfortable with his foot apperance first to himself and be comfortable to show to his immediate family and distant family as he feels comfortable and talk about this with his family to help him process through this difficult time  With above conversation: PT performed desensitization with his sock from his proximal ankle to distal toes working in various parts of the L foot only.                      PT Short Term Goals - 10/19/19 1629      PT SHORT TERM GOAL #1   Title Patient will be independent with initial HEP for Balance/Strengthening (All STG due on 4th Visit)    Baseline reports independence with HEP    Time 4    Period Weeks    Status Achieved    Target Date 10/01/19   after 4 visits     PT SHORT TERM GOAL #2   Title Patient will report understanding of fall prevention strategies    Baseline educated/provided handout    Time 4    Period Weeks    Status Achieved      PT SHORT TERM GOAL #3   Title Patient will demo ability to ambulate >500 ft on indoor surfaces, Mod I with no AD for improved ambulation    Baseline 545 ft Mod I    Time 4    Period Weeks    Status Achieved             PT Long Term Goals - 09/03/19 1537      PT LONG TERM GOAL #1   Title Patient will be independent with Final HEP for Balance/Strengthening (All LTG due after 8th Visit)    Baseline no HEP established    Time 8    Period Weeks    Status New    Target Date 10/29/19      PT LONG TERM GOAL #2   Title Patient will improve Berg Balance to >/= 52/56 to demonstrate reduced fall risk and improved balance    Baseline 45/56    Time 8    Period Weeks    Status New      PT LONG TERM GOAL #3   Title Patient will demo ability to ambulate >1000 ft on indoor/outdoor surfaces with no AD  at Mod I    Baseline 115 supervision    Time 8    Period Weeks    Status New      PT LONG TERM GOAL #4   Title Patient will demo  ability to ascend/descend 12 stairs at Mod I level for improved ambulation within home    Baseline no baseline established    Time 8    Period Weeks    Status New      PT LONG TERM GOAL #5   Title Patient will report being able to stand for >10 minutes with no increase in pain in bilateral feet    Baseline 3/10 pain with standing    Time 8    Period Weeks    Status New                 Plan - 10/26/19 1859    Clinical Impression Statement Today' s session was focused on educating patient on possible phantom limb pain and how to improve that pain by improving his knowledge regarding phantom limb pain, neurophysiology (nerve healting time, increased nerve sensitivity, imporper communication via nerve between tissue and brain) and possible treatment options of neuroimagery and desensitization. Patient was needed motivation but encouragement to look at his foot for the first time during PT session today. HE was emotional but he was able to process the information better and he felt better after looking at his foot today. Patient reported his neuropathic pain decrease from 4/10 to 1/10. Patient was educated on how to properly desensitize his foot at home and provide stimulation by touch, pressure and visual feedback.    Personal Factors and Comorbidities Comorbidity 2;Time since onset of injury/illness/exacerbation;Transportation    Comorbidities Hx of CVA, Gangerene of toes, COVID - 19    Examination-Activity Limitations Squat;Stairs;Stand;Locomotion Level    Examination-Participation Restrictions Community Activity;Driving;Yard Work;Shop    Stability/Clinical Decision Making Evolving/Moderate complexity    Rehab Potential Fair    PT Frequency 1x / week    PT Duration 8 weeks    PT Treatment/Interventions ADLs/Self Care Home Management;Gait training;Stair  training;Functional mobility training;Therapeutic exercise;Therapeutic activities;Balance training;Patient/family education;Orthotic Fit/Training;Passive range of motion;Manual techniques;Energy conservation;Visual/perceptual remediation/compensation;Joint Manipulations;DME Instruction;Neuromuscular re-education    PT Next Visit Plan Did neuro desensitization help from last session? has patient been looking at his foot more? is he actively doing desensitization at home?    Consulted and Agree with Plan of Care Patient           Patient will benefit from skilled therapeutic intervention in order to improve the following deficits and impairments:  Abnormal gait, Decreased balance, Decreased mobility, Decreased endurance, Decreased range of motion, Decreased strength, Difficulty walking, Impaired sensation, Pain, Decreased activity tolerance, Decreased knowledge of use of DME  Visit Diagnosis: Other abnormalities of gait and mobility  Muscle weakness (generalized)  Unsteadiness on feet     Problem List Patient Active Problem List   Diagnosis Date Noted  . Impaired functional mobility, balance, gait, and endurance 08/23/2019  . Amputation of great toe (HCC) 08/23/2019  . 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 Impaired ambulation 06/11/2019  . Dependent on walker for ambulation 04/30/2019  . Right hemiparesis (HCC) 04/30/2019  . Tachycardia 04/30/2019  . 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 Acute blood loss anemia   . Uncontrolled type 2 diabetes mellitus with hyperglycemia (HCC)   . Dry gangrene (HCC)   . Leukocytosis   . 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 Status post tracheostomy (HCC)   . Pressure injury of skin 03/02/2019  . On mechanically assisted ventilation (HCC)   . Goals of care, counseling/discussion   . Palliative care encounter   . ARDS (adult respiratory distress syndrome) (HCC)   .  Cerebral embolism with cerebral infarction  02/17/2019  . Acute respiratory failure with hypoxia (HCC)   . Pneumothorax, right     Ileana Ladd, PT 10/26/2019, 7:05 PM  [facility] [address] [place], [place], [address] Phone: [phone]   Fax:  [phone]  Name: Allan Minotti MRN: [id number] Date of Birth: 1965/03/09

## 2019-10-26 NOTE — Therapy (Signed)
[facility] [address] [place], [place], [address] Phone: [phone]   Fax:  [phone]  Occupational Therapy Treatment  Patient Details  Name: Andrew Bruce MRN: [id number] Date of Birth: [birth year] Referring Provider (OT): Dr. Courtney Heys   Encounter Date: 10/26/2019   OT End of Session - 10/26/19 1537    Visit Number 5    Number of Visits 9    Date for OT Re-Evaluation 11/08/19    Authorization Type UHC Medicaid, 27 visit limit combined (used 4 previously)    Authorization Time Period week 4/8 (10/5)    Authorization - Visit Number 5    Authorization - Number of Visits 8    OT Start Time 3354    OT Stop Time 1615    OT Time Calculation (min) 43 min    Activity Tolerance Patient tolerated treatment well    Behavior During Therapy [facility] for tasks assessed/performed           Past Medical History:  Diagnosis Date   COVID-19 02/06/2019   Diabetes mellitus without complication ([place])    Hypertension    PE (pulmonary thromboembolism) ([place])    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   Pneumonia    with Covid   Stroke ([place])    weakness on right side, occurred while he had Covid    Past Surgical History:  Procedure Laterality Date   AMPUTATION Right 08/13/2019   Procedure: AMPUTATION RIGHT FIRST AND SECOND TOES;  Surgeon: Rosetta Posner, MD;  Location: [facility];  Service: Vascular;  Laterality: Right;   DIRECT LARYNGOSCOPY Bilateral 04/06/2019   Procedure: MICRO DIRECT LARYNGOSCOPY WITH PROLARYN INJECTION;  Surgeon: Melida Quitter, MD;  Location: [facility];  Service: ENT;  Laterality: Bilateral;   IR GASTROSTOMY TUBE MOD SED  03/10/2019   IR GASTROSTOMY TUBE REMOVAL  04/20/2019   WOUND DEBRIDEMENT Left 08/13/2019   Procedure: AMPUTATION  OF LEFT FIRST TOE, AND DEBRIDEMENT OF LEFT SECOND, THIRD AND FOURTH TOES;  Surgeon: Rosetta Posner, MD;  Location: [facility];  Service: Vascular;  Laterality:  Left;    There were no vitals filed for this visit.   Subjective Assessment - 10/26/19 1535    Subjective  Pt reports pain on the top of the his feet. He reports numbness in the index and thumb on RUE and "sticky" feeling.    Pertinent History CVA w/ mild Rt hemiparesis secondary to COVID and ARF Jan 2021. Pt intubated 02/07/19 and trachecd 5/62/56 - ICU complicated by PE and multiple CVA'S, gangrene of toes Lt 1-4 and Rt 1, 2, 3, 5 s/p amputation of R 1,2 toes and L 1st toe    Limitations fall risk    Patient Stated Goals be able to write better, walk better, reach higher    Currently in Pain? Yes    Pain Score 4     Pain Location Calf    Pain Orientation Left;Right    Pain Descriptors / Indicators Aching    Pain Onset More than a month ago    Pain Frequency Constant    Aggravating Factors  walking, too much time sitting down    Pain Relieving Factors elevating them                        OT Treatments/Exercises (OP) - 10/26/19 1544      ADLs   Bathing pt reports bathing in socks secondary to not  wanting to see his feet s/p amputation      Exercises   Exercises Theraputty      Shoulder Exercises: ROM/Strengthening   UBE (Upper Arm Bike) 5 minutes BUE level 2      Hand Exercises   Other Hand Exercises Resistance Clothespins RUE 1-8#    Other Hand Exercises --      Theraputty   Theraputty - Flatten issued red theraputty      Fine Motor Coordination (Hand/Wrist)   Fine Motor Coordination Grooved pegs    Grooved pegs RUE - difficulty with rotating pegs and reports feelings of his fingers feeling "sticky"                  OT Education - 10/26/19 1604    Education Details issued red theraputty - see pt instructions    Person(s) Educated Patient    Methods Explanation;Demonstration    Comprehension Verbalized understanding;Returned demonstration            OT Short Term Goals - 10/19/19 1540      OT SHORT TERM GOAL #1   Title Pt will be  independent with updated HEP for RUE strength and coordination.--check STGs 10/09/19    Baseline needs updates to previous HEP for progression    Time 4    Period Weeks    Status Achieved   pt has been consistently doing HEP - supervision with exercises 10/08/19     OT SHORT TERM GOAL #2   Title Pt will demo at least 120* R shoulder flex for functional reaching without pain.    Baseline 115*    Time 4    Period Weeks    Status Achieved   126* R shoulder flexion 10/08/19 pain 2/10     OT SHORT TERM GOAL #3   Title Pt will demo at least 40lbs R grip strength to assist with opening containers/lifting objects.    Baseline R- 36lbs, L-56.6lbs    Time 4    Period Weeks    Status Achieved   42.3 lbs RUE 10/08/19     OT SHORT TERM GOAL #4   Title Pt will improve R lateral pinch strength to at least 12lbs to assist with ADLs/opening packages.    Baseline R-9lbs, L-16lbs    Time 4    Period Weeks    Status Achieved   12 lbs RUE lateral pinch 10/08/19     OT SHORT TERM GOAL #5   Title --             OT Long Term Goals - 10/19/19 1541      OT LONG TERM GOAL #1   Title Pt wil improve RUE strength/ROM as shown by being able to retrieve/replace 3lb object on overhead shelf safely.    Baseline unable    Time 8    Period Weeks    Status Achieved   3-4# dumbbells to and from [place] #2   Title Pt will demo at least 125* R shoulder flex for functional reaching without pain.    Baseline 115*    Time 8    Period Weeks    Status Achieved      OT LONG TERM GOAL #3   Title Pt will demo at least 45lbs R grip strength to assist with opening containers/gripping objects.    Baseline R-36lbs, L 56lbs    Time 8    Period Weeks    Status On-going  44.5 lbs RUE 10/19/19     OT LONG TERM GOAL #4   Title Pt will demo at least 9lbs R tip pinch strength to assist with ADLs/holding objects for IADLs.    Baseline R-5lbs, L-13lbs    Time 8    Period Weeks    Status Achieved    11lb 10/19/19     OT LONG TERM GOAL #5   Title --                 Plan - 10/26/19 1541    Clinical Impression Statement Pt is progressing towards goals. Pt has met all of his STGs and is working towards meeting [place].    OT Occupational Profile and History Detailed Assessment- Review of Records and additional review of physical, cognitive, psychosocial history related to current functional performance    Occupational performance deficits (Please refer to evaluation for details): ADL's;IADL's;Work;Leisure    Body Structure / Function / Physical Skills ADL;ROM;IADL;Endurance;Body mechanics;Sensation;Mobility;Flexibility;Strength;Coordination;FMC;Pain;UE functional use;Decreased knowledge of use of DME;Dexterity    Rehab Potential Good    Clinical Decision Making Several treatment options, min-mod task modification necessary    Comorbidities Affecting Occupational Performance: Presence of comorbidities impacting occupational performance    Comorbidities impacting occupational performance description: long Covid, toe amputation    Modification or Assistance to Complete Evaluation  Min-Moderate modification of tasks or assist with assess necessary to complete eval    OT Frequency 1x / week    OT Duration 8 weeks   +eval   OT Treatment/Interventions Self-care/ADL training;Therapeutic exercise;Functional Mobility Training;Aquatic Therapy;Neuromuscular education;Manual Therapy;Energy conservation;Therapeutic activities;DME and/or AE instruction;Moist Heat;Passive range of motion;Patient/family education;Fluidtherapy    Plan functional reaching with dynamic balance, grip strength    Consulted and Agree with Plan of Care Patient           Patient will benefit from skilled therapeutic intervention in order to improve the following deficits and impairments:   Body Structure / Function / Physical Skills: ADL, ROM, IADL, Endurance, Body mechanics, Sensation, Mobility, Flexibility, Strength,  Coordination, FMC, Pain, UE functional use, Decreased knowledge of use of DME, Dexterity       Visit Diagnosis: Other abnormalities of gait and mobility  Muscle weakness (generalized)  Hemiplegia and hemiparesis following cerebral infarction affecting right dominant side (HCC)  Unsteadiness on feet  Other lack of coordination  Other disturbances of skin sensation    Problem List Patient Active Problem List   Diagnosis Date Noted   Impaired functional mobility, balance, gait, and endurance 08/23/2019   Amputation of great toe ([place]) 08/23/2019   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   Impaired ambulation 06/11/2019   Dependent on walker for ambulation 04/30/2019   Right hemiparesis ([place]) 04/30/2019   Tachycardia 04/30/2019   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   Acute blood loss anemia    Uncontrolled type 2 diabetes mellitus with hyperglycemia (HCC)    Dry gangrene (HCC)    Leukocytosis    Embolic stroke ([place]) 54/62/7035   Status post tracheostomy ([place])    Pressure injury of skin 03/02/2019   On mechanically assisted ventilation (HCC)    Goals of care, counseling/discussion    Palliative care encounter    ARDS (adult respiratory distress syndrome) ([place])    Cerebral embolism with cerebral infarction 02/17/2019   Acute respiratory failure with hypoxia (HCC)    Pneumothorax, right     Zachery Conch MOT, OTR/L  10/26/2019, 4:48 PM  [facility] [phone]  [place], [place], [address] Phone: [phone]   Fax:  [phone]  Name: Andrew Bruce MRN: [id number] Date of Birth: 10-18-1965

## 2019-10-29 ENCOUNTER — Ambulatory Visit (INDEPENDENT_AMBULATORY_CARE_PROVIDER_SITE_OTHER): Payer: Medicaid Other | Admitting: Physician Assistant

## 2019-10-29 ENCOUNTER — Other Ambulatory Visit: Payer: Self-pay

## 2019-10-29 VITALS — BP 138/90 | HR 85 | Temp 98.8°F | Resp 20 | Ht 66.0 in | Wt 182.8 lb

## 2019-10-29 DIAGNOSIS — L97529 Non-pressure chronic ulcer of other part of left foot with unspecified severity: Secondary | ICD-10-CM | POA: Diagnosis not present

## 2019-10-29 DIAGNOSIS — L97519 Non-pressure chronic ulcer of other part of right foot with unspecified severity: Secondary | ICD-10-CM

## 2019-10-29 NOTE — Progress Notes (Signed)
POST OPERATIVE OFFICE NOTE    CC:  F/u for surgery  HPI:  This is a 54 y.o. male who is s/p amputation of left first, second, third, and fourth toes and right first and second toes on 08/13/2019.  He presents for follow-up of his incisions.  He is undergoing physical therapy and has some residual lower extremity pain with exercise.  This is not claudication in nature.  He has obtained orthotics.  Interview and exam conducted with language interpreter video services No Known Allergies  Current Outpatient Medications  Medication Sig Dispense Refill  . acetaminophen (TYLENOL) 325 MG tablet Take 1-2 tablets (325-650 mg total) by mouth every 4 (four) hours as needed for mild pain. 100 tablet 0  . apixaban (ELIQUIS) 5 MG TABS tablet Take 1 tablet (5 mg total) by mouth 2 (two) times daily. 60 tablet 1  . atorvastatin (LIPITOR) 20 MG tablet Take 1 tablet (20 mg total) by mouth daily at 6 PM. 30 tablet 0  . blood glucose meter kit and supplies KIT Dispense based on patient and insurance preference. Use up to four times daily as directed. (FOR ICD-9 250.00, 250.01). 1 each 0  . chlorhexidine (PERIDEX) 0.12 % solution 15 mLs 2 (two) times daily.    Marland Kitchen doxazosin (CARDURA) 1 MG tablet Take 1 tablet (1 mg total) by mouth at bedtime. 30 tablet 0  . famotidine (PEPCID) 20 MG tablet Take 1 tablet (20 mg total) by mouth 2 (two) times daily. 60 tablet 1  . glipiZIDE (GLUCOTROL) 5 MG tablet Take 0.5 tablets (2.5 mg total) by mouth daily before breakfast. 30 tablet 0  . ibuprofen (ADVIL) 800 MG tablet Take 800 mg by mouth every 6 (six) hours as needed.    . iron polysaccharides (NIFEREX) 150 MG capsule Take 1 capsule (150 mg total) by mouth 2 (two) times daily. 60 capsule 0  . metFORMIN (GLUCOPHAGE) 500 MG tablet Take 1 tablet (500 mg total) by mouth 2 (two) times daily with a meal. 60 tablet 0  . metoprolol tartrate (LOPRESSOR) 25 MG tablet Take 0.5 tablets (12.5 mg total) by mouth 2 (two) times daily. 30 tablet  0  . oxyCODONE-acetaminophen (PERCOCET) 5-325 MG tablet Take 1 tablet by mouth every 4 (four) hours as needed for severe pain. 20 tablet 0  . potassium chloride (KLOR-CON) 10 MEQ tablet Take 1 tablet (10 mEq total) by mouth daily. 30 tablet 0  . traMADol (ULTRAM) 50 MG tablet Take 1-2 tablets (50-100 mg total) by mouth every 6 (six) hours as needed for moderate pain or severe pain. 180 tablet 3  . traZODone (DESYREL) 50 MG tablet Take 0.5 tablets (25 mg total) by mouth at bedtime as needed for sleep. 15 tablet 0   No current facility-administered medications for this visit.     ROS:  See HPI  BP 138/90 (BP Location: Left Arm, Patient Position: Sitting, Cuff Size: Normal)   Pulse 85   Temp 98.8 F (37.1 C) (Temporal)   Resp 20   Ht $R'5\' 6"'ZT$  (1.676 m)   Wt 182 lb 12.8 oz (82.9 kg)   SpO2 98%   BMI 29.50 kg/m   Physical Exam:  General appearance: Well-developed well-nourished male in no apparent distress Cardiac: Rate and rhythm regular Respiratory: Nonlabored Incision: Well-healed Extremities: Right lower extremity without evidence of wound breakdown or ischemia.  He has 2+ dorsalis pedis and posterior tibial pulses bilaterally.  Skin is warm feet well perfused Neuro: Alert and oriented x4   Assessment/Plan:  This is a 54 y.o. male who is s/p: Bilateral toe amputation secondary to critical illness requiring vasopressors.  His amputation sites have healed.  He has some residual numbness.  I explained this may resolve with time but he may have some residual numbness. Follow-up as needed  Risa Grill, PA-C [facility] [phone]  Clinic MD:  Donzetta Matters

## 2019-11-02 ENCOUNTER — Encounter: Payer: Self-pay | Admitting: Occupational Therapy

## 2019-11-02 ENCOUNTER — Ambulatory Visit: Payer: Medicaid Other | Admitting: Occupational Therapy

## 2019-11-02 ENCOUNTER — Ambulatory Visit: Payer: Medicaid Other

## 2019-11-02 ENCOUNTER — Other Ambulatory Visit: Payer: Self-pay

## 2019-11-02 ENCOUNTER — Encounter: Payer: Medicaid Other | Admitting: Occupational Therapy

## 2019-11-02 DIAGNOSIS — M6281 Muscle weakness (generalized): Secondary | ICD-10-CM

## 2019-11-02 DIAGNOSIS — R278 Other lack of coordination: Secondary | ICD-10-CM

## 2019-11-02 DIAGNOSIS — R2689 Other abnormalities of gait and mobility: Secondary | ICD-10-CM

## 2019-11-02 DIAGNOSIS — I69351 Hemiplegia and hemiparesis following cerebral infarction affecting right dominant side: Secondary | ICD-10-CM

## 2019-11-02 DIAGNOSIS — R2681 Unsteadiness on feet: Secondary | ICD-10-CM

## 2019-11-02 NOTE — Therapy (Signed)
[facility] [address] [place], [place], [address] Phone: [phone]   Fax:  [phone]  Physical Therapy Discharge Summary  Patient Details  Name: Andrew Bruce MRN: [id number] Date of Birth: 1965/07/17 Referring Provider (PT): Courtney Heys, MD   Encounter Date: 11/02/2019   PT End of Session - 11/02/19 1907    Visit Number 9    Number of Visits 9    Date for PT Re-Evaluation 12/02/19   POC for 8 weeks, Cert for 90 days   Authorization Type UHC Medicaid (27 visit limit)    Authorization Time Period no auth required currently    Progress Note Due on Visit 8    PT Start Time 1615    PT Stop Time 1700    PT Time Calculation (min) 45 min    Equipment Utilized During Treatment Gait belt    Activity Tolerance Patient tolerated treatment well    Behavior During Therapy WFL for tasks assessed/performed   Tearful          Past Medical History:  Diagnosis Date  . COVID-19 02/06/2019  . Diabetes mellitus without complication ([place])   . Hypertension   . 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 Pneumonia    with Covid  . Stroke [facility])    weakness on right side, occurred while he had Covid    Past Surgical History:  Procedure Laterality Date  . AMPUTATION Right 08/13/2019   Procedure: AMPUTATION RIGHT FIRST AND SECOND TOES;  Surgeon: Rosetta Posner, MD;  Location: [facility];  Service: Vascular;  Laterality: Right;  . DIRECT LARYNGOSCOPY Bilateral 04/06/2019   Procedure: MICRO DIRECT LARYNGOSCOPY WITH PROLARYN INJECTION;  Surgeon: Melida Quitter, MD;  Location: [facility];  Service: ENT;  Laterality: Bilateral;  . IR GASTROSTOMY TUBE MOD SED  03/10/2019  . IR GASTROSTOMY TUBE REMOVAL  04/20/2019  . WOUND DEBRIDEMENT Left 08/13/2019   Procedure: AMPUTATION  OF LEFT FIRST TOE, AND DEBRIDEMENT OF LEFT SECOND, THIRD AND FOURTH TOES;  Surgeon: Rosetta Posner, MD;  Location: [place];  Service: Vascular;  Laterality: Left;    There were no vitals filed for this visit.       [facility] PT Assessment - 11/02/19 0001      Berg Balance Test   Sit to Stand Able to stand without using hands and stabilize independently    Standing Unsupported Able to stand safely 2 minutes    Sitting with Back Unsupported but Feet Supported on Floor or Stool Able to sit safely and securely 2 minutes    Stand to Sit Sits safely with minimal use of hands    Transfers Able to transfer safely, minor use of hands    Standing Unsupported with Eyes Closed Able to stand 10 seconds safely    Standing Unsupported with Feet Together Able to place feet together independently and stand 1 minute safely    From Standing, Reach Forward with Outstretched Arm Can reach confidently >25 cm (10")    From Standing Position, Pick up Object from Floor Able to pick up shoe safely and easily    From Standing Position, Turn to Look Behind Over each Shoulder Looks behind from both sides and weight shifts well    Turn 360 Degrees Able to turn 360 degrees safely in 4 seconds or less    Standing Unsupported, Alternately Place Feet on Step/Stool Able to stand independently and  safely and complete 8 steps in 20 seconds    Standing Unsupported, One Foot in Front Able to plae foot ahead of the other independently and hold 30 seconds    Standing on One Leg Able to lift leg independently and hold equal to or more than 3 seconds    Total Score 53    Berg comment: 53/56 low fall risk               Neuro desensitization: To bil ankles and feet with socks. 20' Intertarsal mobilization bil  TherEx: Sci fit stepper: constant work 70 watts: 10' UE and LE Seated towel scunches 50x R and L  Berg balance scale performed                    PT Short Term Goals - 10/19/19 1629      PT SHORT TERM GOAL #1   Title Patient will be independent with initial HEP for Balance/Strengthening (All STG due on 4th  Visit)    Baseline reports independence with HEP    Time 4    Period Weeks    Status Achieved    Target Date 10/01/19   after 4 visits     PT SHORT TERM GOAL #2   Title Patient will report understanding of fall prevention strategies    Baseline educated/provided handout    Time 4    Period Weeks    Status Achieved      PT SHORT TERM GOAL #3   Title Patient will demo ability to ambulate >500 ft on indoor surfaces, Mod I with no AD for improved ambulation    Baseline 545 ft Mod I    Time 4    Period Weeks    Status Achieved             PT Long Term Goals - 11/02/19 1902      PT LONG TERM GOAL #1   Title Patient will be independent with Final HEP for Balance/Strengthening (All LTG due after 8th Visit)    Baseline no HEP established    Time 8    Period Weeks    Status Achieved      PT LONG TERM GOAL #2   Title Patient will improve Berg Balance to >/= 52/56 to demonstrate reduced fall risk and improved balance    Baseline 45/56; 53/56 11/02/19    Time 8    Period Weeks    Status Achieved      PT LONG TERM GOAL #3   Title Patient will demo ability to ambulate >1000 ft on indoor/outdoor surfaces with no AD at Mod I    Baseline 115 supervision    Time 8    Period Weeks    Status Achieved      PT LONG TERM GOAL #4   Title Patient will demo ability to ascend/descend 12 stairs at Mod I level for improved ambulation within home    Baseline no baseline established; able to with use of one rail    Time 8    Period Weeks    Status Achieved      PT LONG TERM GOAL #5   Title Patient will report being able to stand for >10 minutes with no increase in pain in bilateral feet    Baseline 3/10 pain with standing    Time 8    Period Weeks    Status Achieved  Plan - 11/02/19 1904    Clinical Impression Statement Patient has been seen for total of 9 session from 09/03/19 to 11/02/19 after toes amputaion in bil LE. Patient has met all of his Short term and  long term functional goals. Patient is currently experiencing 3-4/10 phantom neuropathic pain in bil feet. patient is responding well with neruodesensitizationof bil feet. Patient will be discharged from skilled PT with independent HEP.    Personal Factors and Comorbidities Comorbidity 2;Time since onset of injury/illness/exacerbation;Transportation    Comorbidities Hx of CVA, Gangerene of toes, COVID - 19    Examination-Activity Limitations Squat;Stairs;Stand;Locomotion Level    Examination-Participation Restrictions Community Activity;Driving;Yard Work;Shop    Stability/Clinical Decision Making Evolving/Moderate complexity    Rehab Potential Fair    PT Frequency 1x / week    PT Duration 8 weeks    PT Treatment/Interventions Patient/family education    PT Next Visit Plan --    Consulted and Agree with Plan of Care Patient;Other (Comment)   Interpreter present          Patient will benefit from skilled therapeutic intervention in order to improve the following deficits and impairments:  Abnormal gait, Decreased balance, Decreased mobility, Decreased endurance, Decreased range of motion, Decreased strength, Difficulty walking, Impaired sensation, Pain, Decreased activity tolerance, Decreased knowledge of use of DME  Visit Diagnosis: Other abnormalities of gait and mobility  Muscle weakness (generalized)  Unsteadiness on feet  Hemiplegia and hemiparesis following cerebral infarction affecting right dominant side [facility])     Problem List Patient Active Problem List   Diagnosis Date Noted  . Impaired functional mobility, balance, gait, and endurance 08/23/2019  . Amputation of great toe ([place]) 08/23/2019  . 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 Impaired ambulation 06/11/2019  . Dependent on walker for ambulation 04/30/2019  . Right hemiparesis ([place]) 04/30/2019  . Tachycardia 04/30/2019  . 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 Acute blood loss anemia    . Uncontrolled type 2 diabetes mellitus with hyperglycemia ([place])   . Dry gangrene ([place])   . Leukocytosis   . Embolic stroke ([place]) 42/10/3126  . Status post tracheostomy ([place])   . Pressure injury of skin 03/02/2019  . On mechanically assisted ventilation ([place])   . Goals of care, counseling/discussion   . Palliative care encounter   . ARDS (adult respiratory distress syndrome) ([place])   . Cerebral embolism with cerebral infarction 02/17/2019  . Acute respiratory failure with hypoxia ([place])   . Pneumothorax, right     Kerrie Pleasure, PT 11/02/2019, 7:09 PM  [facility] [address] [place], [place], [address] Phone: [phone]   Fax:  [phone]  Name: Romond Pipkins MRN: [id number] Date of Birth: 1965/07/16

## 2019-11-02 NOTE — Therapy (Signed)
[facility] [address] [place], [place], [address] Phone: [phone]   Fax:  [phone]  Occupational Therapy Treatment  Patient Details  Name: Andrew Bruce MRN: [id number] Date of Birth: 1965-02-07 Referring Provider (OT): Dr. Courtney Heys   Encounter Date: 11/02/2019   OT End of Session - 11/02/19 1537    Visit Number 6    Number of Visits 9    Date for OT Re-Evaluation 11/08/19    Authorization Type UHC Medicaid, 27 visit limit combined (used 4 previously)    Authorization Time Period week 7/8 (10/19)    Authorization - Visit Number 6    Authorization - Number of Visits 8    OT Start Time 4628    OT Stop Time 1615    OT Time Calculation (min) 42 min    Activity Tolerance Patient tolerated treatment well    Behavior During Therapy [facility] for tasks assessed/performed           Past Medical History:  Diagnosis Date  . COVID-19 02/06/2019  . Diabetes mellitus without complication ([place])   . Hypertension   . PE (pulmonary thromboembolism) ([place])    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  . Pneumonia    with Covid  . Stroke [facility])    weakness on right side, occurred while he had Covid    Past Surgical History:  Procedure Laterality Date  . AMPUTATION Right 08/13/2019   Procedure: AMPUTATION RIGHT FIRST AND SECOND TOES;  Surgeon: Rosetta Posner, MD;  Location: [facility];  Service: Vascular;  Laterality: Right;  . DIRECT LARYNGOSCOPY Bilateral 04/06/2019   Procedure: MICRO DIRECT LARYNGOSCOPY WITH PROLARYN INJECTION;  Surgeon: Melida Quitter, MD;  Location: [facility];  Service: ENT;  Laterality: Bilateral;  . IR GASTROSTOMY TUBE MOD SED  03/10/2019  . IR GASTROSTOMY TUBE REMOVAL  04/20/2019  . WOUND DEBRIDEMENT Left 08/13/2019   Procedure: AMPUTATION  OF LEFT FIRST TOE, AND DEBRIDEMENT OF LEFT SECOND, THIRD AND FOURTH TOES;  Surgeon: Rosetta Posner, MD;  Location: [employer];  Service: Vascular;  Laterality:  Left;    There were no vitals filed for this visit.   Subjective Assessment - 11/02/19 1538    Subjective  Pt reports looking at toes on the left more. Pt reports pain always in his legs.    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 Pt intubated 02/07/19 and trachecd 01/30/55 - ICU complicated by PE and multiple CVA'S, gangrene of toes Lt 1-4 and Rt 1, 2, 3, 5 s/p amputation of R 1,2 toes and L 1st toe    Limitations fall risk    Patient Stated Goals be able to write better, walk better, reach higher    Currently in Pain? Yes    Pain Score 3     Pain Location Calf    Pain Orientation Left;Right    Pain Descriptors / Indicators Aching    Pain Type Chronic pain    Pain Onset More than a month ago    Pain Frequency Constant                        OT Treatments/Exercises (OP) - 11/02/19 0001      Exercises   Exercises Work Hardening      Careers information officer  Other Hand Exercises Digi Flex 7.0 lb RUE ; resistance clothespins 1-8# with RUE and reaching overhead with RUE    Other Hand Exercises Medicine ball for grip strength and shoulder press 3.3 lbs x10      Work Hardening Exercises   UBE (Upper Arm Bike) 8 minutes Level 3      Neurological Re-education Exercises   Hand Gripper with Medium Beads 1 inch blocks with RUE level 2; second set level 3      Functional Reaching Activities   High Level able to reach over head approx 170* with resistance clothespins                  OT Education - 11/02/19 1737    Education Details education provided on discharge and plan.    Person(s) Educated Patient    Methods Explanation    Comprehension Verbalized understanding            OT Short Term Goals - 11/02/19 1546      OT SHORT TERM GOAL #1   Title Pt will be independent with updated HEP for RUE strength and coordination.--check STGs  10/09/19    Baseline needs updates to previous HEP for progression    Time 4    Period Weeks    Status Achieved   pt has been consistently doing HEP - supervision with exercises 10/08/19     OT SHORT TERM GOAL #2   Title Pt will demo at least 120* R shoulder flex for functional reaching without pain.    Baseline 115*    Time 4    Period Weeks    Status Achieved   126* R shoulder flexion 10/08/19 pain 2/10     OT SHORT TERM GOAL #3   Title Pt will demo at least 40lbs R grip strength to assist with opening containers/lifting objects.    Baseline R- 36lbs, L-56.6lbs    Time 4    Period Weeks    Status Achieved   42.3 lbs RUE 10/08/19     OT SHORT TERM GOAL #4   Title Pt will improve R lateral pinch strength to at least 12lbs to assist with ADLs/opening packages.    Baseline R-9lbs, L-16lbs    Time 4    Period Weeks    Status Achieved   12 lbs RUE lateral pinch 10/08/19     OT SHORT TERM GOAL #5   Title --             OT Long Term Goals - 11/02/19 1543      OT LONG TERM GOAL #1   Title Pt wil improve RUE strength/ROM as shown by being able to retrieve/replace 3lb object on overhead shelf safely.    Baseline unable    Time 8    Period Weeks    Status Achieved   3-4# dumbbells to and from [place] #2   Title Pt will demo at least 125* R shoulder flex for functional reaching without pain.    Baseline 115*    Time 8    Period Weeks    Status Achieved      OT LONG TERM GOAL #3   Title Pt will demo at least 45lbs R grip strength to assist with opening containers/gripping objects.    Baseline R-36lbs, L 56lbs    Time 8    Period Weeks    Status Not Met   41.9 lbs  OT LONG TERM GOAL #4   Title Pt will demo at least 9lbs R tip pinch strength to assist with ADLs/holding objects for IADLs.    Baseline R-5lbs, L-13lbs    Time 8    Period Weeks    Status Achieved   11lb 10/19/19     OT LONG TERM GOAL #5   Title --                 Plan -  11/02/19 1547    Clinical Impression Statement Pt is progressing towards goals. Pt has met all of his STGs and has met 3/4 of LTGs. Pt continues to present with deficits with RUE strength.    OT Occupational Profile and History Detailed Assessment- Review of Records and additional review of physical, cognitive, psychosocial history related to current functional performance    Occupational performance deficits (Please refer to evaluation for details): ADL's;IADL's;Work;Leisure    Body Structure / Function / Physical Skills ADL;ROM;IADL;Endurance;Body mechanics;Sensation;Mobility;Flexibility;Strength;Coordination;FMC;Pain;UE functional use;Decreased knowledge of use of DME;Dexterity    Rehab Potential Good    Clinical Decision Making Several treatment options, min-mod task modification necessary    Comorbidities Affecting Occupational Performance: Presence of comorbidities impacting occupational performance    Comorbidities impacting occupational performance description: long Covid, toe amputation    Modification or Assistance to Complete Evaluation  Min-Moderate modification of tasks or assist with assess necessary to complete eval    OT Frequency 1x / week    OT Duration 8 weeks   +eval   OT Treatment/Interventions Self-care/ADL training;Therapeutic exercise;Functional Mobility Training;Aquatic Therapy;Neuromuscular education;Manual Therapy;Energy conservation;Therapeutic activities;DME and/or AE instruction;Moist Heat;Passive range of motion;Patient/family education;Fluidtherapy    Plan discharge    Consulted and Agree with Plan of Care Patient           OCCUPATIONAL THERAPY DISCHARGE SUMMARY  Visits from Start of Care: 6  Current functional level related to goals / functional outcomes: Mod I     Remaining deficits: Some RUE weakness   Education / Equipment: HEP for strengthening and coordination  Plan: Patient agrees to discharge.  Patient goals were partially met. Patient is being  discharged due to meeting the stated rehab goals.  ?????       Patient will benefit from skilled therapeutic intervention in order to improve the following deficits and impairments:   Body Structure / Function / Physical Skills: ADL, ROM, IADL, Endurance, Body mechanics, Sensation, Mobility, Flexibility, Strength, Coordination, FMC, Pain, UE functional use, Decreased knowledge of use of DME, Dexterity       Visit Diagnosis: Other abnormalities of gait and mobility  Muscle weakness (generalized)  Hemiplegia and hemiparesis following cerebral infarction affecting right dominant side ([facility])  Unsteadiness on feet  Other lack of coordination    Problem List Patient Active Problem List   Diagnosis Date Noted  . Impaired functional mobility, balance, gait, and endurance 08/23/2019  . Amputation of great toe ([place]) 08/23/2019  . 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 Impaired ambulation 06/11/2019  . Dependent on walker for ambulation 04/30/2019  . Right hemiparesis ([place]) 04/30/2019  . Tachycardia 04/30/2019  . 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 Acute blood loss anemia   . Uncontrolled type 2 diabetes mellitus with hyperglycemia ([place])   . Dry gangrene ([place])   . Leukocytosis   . Embolic stroke ([place]) 09/32/6712  . Status post tracheostomy ([place])   . Pressure injury of skin 03/02/2019  . On mechanically assisted ventilation ([place])   .  Goals of care, counseling/discussion   . Palliative care encounter   . ARDS (adult respiratory distress syndrome) ([place])   . Cerebral embolism with cerebral infarction 02/17/2019  . Acute respiratory failure with hypoxia ([place])   . Pneumothorax, right     Zachery Conch MOT, OTR/L  11/02/2019, 5:38 PM  [facility] [address] [place], [place], [address] Phone: [phone]   Fax:  [phone]  Name: Andrew Bruce MRN: [id number] Date of  Birth: December 29, 1965

## 2019-11-19 ENCOUNTER — Encounter: Payer: Self-pay | Admitting: Physical Medicine and Rehabilitation

## 2019-11-19 ENCOUNTER — Encounter
Payer: Medicaid Other | Attending: Physical Medicine and Rehabilitation | Admitting: Physical Medicine and Rehabilitation

## 2019-11-19 ENCOUNTER — Other Ambulatory Visit: Payer: Self-pay

## 2019-11-19 VITALS — BP 185/110 | HR 90 | Temp 98.4°F | Ht 66.0 in | Wt 186.6 lb

## 2019-11-19 DIAGNOSIS — L89890 Pressure ulcer of other site, unstageable: Secondary | ICD-10-CM | POA: Diagnosis not present

## 2019-11-19 DIAGNOSIS — E1165 Type 2 diabetes mellitus with hyperglycemia: Secondary | ICD-10-CM | POA: Insufficient documentation

## 2019-11-19 DIAGNOSIS — R262 Difficulty in walking, not elsewhere classified: Secondary | ICD-10-CM | POA: Diagnosis not present

## 2019-11-19 DIAGNOSIS — S98139A Complete traumatic amputation of one unspecified lesser toe, initial encounter: Secondary | ICD-10-CM

## 2019-11-19 DIAGNOSIS — G8191 Hemiplegia, unspecified affecting right dominant side: Secondary | ICD-10-CM | POA: Insufficient documentation

## 2019-11-19 DIAGNOSIS — I96 Gangrene, not elsewhere classified: Secondary | ICD-10-CM | POA: Diagnosis not present

## 2019-11-19 MED ORDER — METFORMIN HCL 500 MG PO TABS: 500.0000 mg | ORAL_TABLET | Freq: Two times a day (BID) | ORAL | 5 refills | Status: DC

## 2019-11-19 MED ORDER — TRAMADOL HCL 50 MG PO TABS: 50.0000 mg | ORAL_TABLET | Freq: Four times a day (QID) | ORAL | 5 refills | Status: DC | PRN

## 2019-11-19 MED ORDER — METOPROLOL TARTRATE 25 MG PO TABS
12.5000 mg | ORAL_TABLET | Freq: Two times a day (BID) | ORAL | 5 refills | Status: DC
Start: 2019-11-19 — End: 2020-02-18

## 2019-11-19 MED ORDER — ATORVASTATIN CALCIUM 20 MG PO TABS: 20.0000 mg | ORAL_TABLET | Freq: Every day | ORAL | 5 refills | Status: DC

## 2019-11-19 MED ORDER — GLIPIZIDE 5 MG PO TABS: 2.5000 mg | ORAL_TABLET | Freq: Every day | ORAL | 5 refills | Status: DC

## 2019-11-19 NOTE — Progress Notes (Signed)
Subjective:    Patient ID: Andrew Bruce, male    DOB: [birth year], 54 y.o.   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   Here for f/u.     Ran out of tramadol 1 week ago- hurting much more since then.   BP is 185/110- today- ran out of BP and DM- meds- explained  Firs t sign of BP/DM problems is a stroke and BP is high enough to have a stroke.   Also having nerve pain and needs nails cut on toes.  Also c/o phantom pain form toe amputations.   Tips of fingers are still numb-    Pain Inventory Average Pain 6 Pain Right Now 5 My pain is tingling  In the last 24 hours, has pain interfered with the following? General activity 5 Relation with others 5 Enjoyment of life 5 What TIME of day is your pain at its worst? morning  Sleep (in general) Fair  Pain is worse with: sitting and laying down Pain improves with: therapy/exercise and medication Relief from Meds: 5  Family History  Problem Relation Age of Onset  . Healthy Sister   . Healthy Brother    Social History   Socioeconomic History  . Marital status: Married    Spouse name: Not on file  . Number of children: Not on file  . Years of education: Not on file  . Highest education level: Not on file  Occupational History  . Not on file  Tobacco Use  . Smoking status: Former Games developer  . Smokeless tobacco: Never Used  Vaping Use  . Vaping Use: Never used  Substance and Sexual Activity  . Alcohol use: Yes    Comment: rare to occasional  . Drug use: Never  . Sexual activity: Yes  Other Topics Concern  . Not on file  Social History Narrative  . Not on file   Social Determinants of Health   Financial Resource Strain:   . Difficulty of Paying Living Expenses: Not on file  Food Insecurity:   . Worried About Programme researcher, broadcasting/film/video in the Last Year: Not on  file  . Ran Out of Food in the Last Year: Not on file  Transportation Needs:   . Lack of Transportation (Medical): Not on file  . Lack of Transportation (Non-Medical): Not on file  Physical Activity:   . Days of Exercise per Week: Not on file  . Minutes of Exercise per Session: Not on file  Stress:   . Feeling of Stress : Not on file  Social Connections:   . Frequency of Communication with Friends and Family: Not on file  . Frequency of Social Gatherings with Friends and Family: Not on file  . Attends Religious Services: Not on file  . Active Member of Clubs or Organizations: Not on file  . Attends Banker Meetings: Not on file  . Marital Status: Not on file   Past Surgical History:  Procedure Laterality Date  . AMPUTATION Right 08/13/2019   Procedure: AMPUTATION RIGHT FIRST AND SECOND TOES;  Surgeon: Larina Earthly, MD;  Location: [facility] OR;  Service: Vascular;  Laterality: Right;  . DIRECT LARYNGOSCOPY Bilateral 04/06/2019   Procedure: MICRO DIRECT LARYNGOSCOPY WITH PROLARYN INJECTION;  Surgeon: Christia Reading, MD;  Location: [facility] OR;  Service: ENT;  Laterality: Bilateral;  . IR GASTROSTOMY TUBE MOD SED  03/10/2019  . IR GASTROSTOMY TUBE REMOVAL  04/20/2019  . WOUND DEBRIDEMENT Left 08/13/2019   Procedure: AMPUTATION  OF LEFT FIRST TOE, AND DEBRIDEMENT OF LEFT SECOND, THIRD AND FOURTH TOES;  Surgeon: Larina Earthly, MD;  Location: [facility] OR;  Service: Vascular;  Laterality: Left;   Past Surgical History:  Procedure Laterality Date  . AMPUTATION Right 08/13/2019   Procedure: AMPUTATION RIGHT FIRST AND SECOND TOES;  Surgeon: Larina Earthly, MD;  Location: [facility] OR;  Service: Vascular;  Laterality: Right;  . DIRECT LARYNGOSCOPY Bilateral 04/06/2019   Procedure: MICRO DIRECT LARYNGOSCOPY WITH PROLARYN INJECTION;  Surgeon: Christia Reading, MD;  Location: [facility] OR;  Service: ENT;  Laterality: Bilateral;  . IR GASTROSTOMY TUBE MOD SED  03/10/2019  . IR GASTROSTOMY TUBE REMOVAL  04/20/2019  . WOUND  DEBRIDEMENT Left 08/13/2019   Procedure: AMPUTATION  OF LEFT FIRST TOE, AND DEBRIDEMENT OF LEFT SECOND, THIRD AND FOURTH TOES;  Surgeon: Larina Earthly, MD;  Location: [facility] OR;  Service: Vascular;  Laterality: Left;   Past Medical History:  Diagnosis Date  . COVID-19 02/06/2019  . Diabetes mellitus without complication (HCC)   . Hypertension   . 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 Pneumonia    with Covid  . Stroke [facility])    weakness on right side, occurred while he had Covid   There were no vitals taken for this visit.  Opioid Risk Score:   Fall Risk Score:  `1  Depression screen PHQ 2/9  Depression screen [facility] 2/9 06/25/2019 06/10/2019 05/13/2019  Decreased Interest 0 0 0  Down, Depressed, Hopeless 0 0 0  PHQ - 2 Score 0 0 0    Review of Systems  Musculoskeletal:       Back of legs  Neurological: Positive for numbness.  All other systems reviewed and are negative.      Objective:   Physical Exam  Awake, alert, appropriate, spanish speaking, interpretor in room, NAD  Amputation of R foot- 1st/2nd toes- others are pink- looks good- completely healed amputations L foot-  Small wound on L 2nd toe- tiny- a little open     Assessment & Plan:     1. Neosporin- antibiotic ointment- over the counter- apply 2x/day for wound on tip /bottom of L 2nd toe to prevent wound from getting worse.    2. Put vaseline or thick lotion/cream on feet 2x/day for dry skin.   3. Send to Dr Ovid Curd- [phone]- referral placed  4. Needs to get a primary care doctor- [facility]- to get a family doctor/provider.  [phone]- can get doctor's appointment there, usually.  5. Will refill his: Glipizide 2.5 mg daily with breakfast- for blood sugars  - his Metformin 500 mg 2x/day with meals- for blood sugars  - his Metoprolol 12.5 mg 2x/day with meals- for Blood pressure  - Atorvastatin 20 mg  daily in evening (with dinner)- for cholesterol  Gave refills of 6 months to  get a primary care physician.    6. Also, renewed his Tramadol - can take 1/2 to 1 pill every 6 hours as needed for nerve and regular pain.   7. Should be covered by medicaid- DON'T stop medicines, please!      8. Won't add another nerve pain medicine- until f/u at next visit- to see how doing on tramadol and see  if needs another pill- also could be less expensive at [facility].    9. F/U in 2 months-   10. Send me forms for disability, or unemployment, etc- I will fill them out.

## 2019-11-19 NOTE — Patient Instructions (Signed)
1. Neosporin- antibiotic ointment- over the counter- apply 2x/day for wound on tip /bottom of L 2nd toe to prevent wound from getting worse.    2. Put vaseline or thick lotion/cream on feet 2x/day for dry skin.   3. Send to Dr Ovid Curd- [phone]- referral placed  4. Needs to get a primary care doctor- [facility]- to get a family doctor/provider.  [phone]- can get doctor's appointment there, usually.  5. Will refill his: Glipizide 2.5 mg daily with breakfast- for blood sugars  - his Metformin 500 mg 2x/day with meals- for blood sugars  - his Metoprolol 12.5 mg 2x/day with meals- for Blood pressure  - Atorvastatin 20 mg daily in evening (with dinner)- for cholesterol  Gave refills of 6 months to  get a primary care physician.    6. Also, renewed his Tramadol - can take 1/2 to 1 pill every 6 hours as needed for nerve and regular pain.   7. Should be covered by medicaid- DON'T stop medicines, please!      8. Won't add another nerve pain medicine- until f/u at next visit- to see how doing on tramadol and see if needs another pill- also could be less expensive at [facility].    9. F/U in 2 months-   10. Send me forms for disability, or unemployment, etc- I will fill them out.

## 2019-11-22 ENCOUNTER — Encounter: Payer: Self-pay | Admitting: Podiatry

## 2019-12-01 ENCOUNTER — Other Ambulatory Visit: Payer: Self-pay

## 2019-12-01 ENCOUNTER — Ambulatory Visit (INDEPENDENT_AMBULATORY_CARE_PROVIDER_SITE_OTHER): Payer: Medicaid Other | Admitting: Podiatry

## 2019-12-01 DIAGNOSIS — E1351 Other specified diabetes mellitus with diabetic peripheral angiopathy without gangrene: Secondary | ICD-10-CM

## 2019-12-01 DIAGNOSIS — I739 Peripheral vascular disease, unspecified: Secondary | ICD-10-CM

## 2019-12-01 DIAGNOSIS — E0843 Diabetes mellitus due to underlying condition with diabetic autonomic (poly)neuropathy: Secondary | ICD-10-CM

## 2019-12-01 NOTE — Progress Notes (Signed)
   HPI: 54 y.o. male PMHx DM type II presenting today as a new patient for evaluation of bilateral foot exam.  Patient has history of Covid at which time he was admitted into the hospital and intubated for approximately 3 months.  Patient states that when he became conscious again after Covid he has had toe amputations performed to the bilateral feet.  This was secondary to ischemia.  Patient states that he is also on anticoagulant medication.  He developed pulmonary thromboembolism and stroke while in the hospital.  He presents for further treatment and evaluation  Past Medical History:  Diagnosis Date  . COVID-19 02/06/2019  . Diabetes mellitus without complication (HCC)   . Hypertension   . 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 Pneumonia    with Covid  . Stroke [facility])    weakness on right side, occurred while he had Covid     Physical Exam: General: The patient is alert and oriented x3 in no acute distress.  Dermatology: Skin is warm, dry and supple bilateral lower extremities. Negative for open lesions or macerations.  Vascular: Palpable pedal pulses bilaterally. No edema or erythema noted.   Neurological: Epicritic and protective threshold grossly intact bilaterally.   Musculoskeletal Exam: Amputation of the bilateral great toes as well as lesser digits noted to the bilateral feet  Assessment: 1.  Diabetes mellitus type 2 2.  History of bilateral toe amputations   Plan of Care:  1. Patient evaluated. 2.  Comprehensive diabetic foot exam was performed today. 3.  Recommend that the patient wear good supportive diabetic shoes.  The patient states that he has diabetic shoes at home. 4.  Patient has follow-up appointment with vascular in January 2022.   5.  Return to clinic annually      Felecia Shelling, DPM [facility]  Dr. Felecia Shelling, DPM    [address] [place], [place] [address]                Office [phone]  Fax [phone]

## 2020-01-17 ENCOUNTER — Encounter: Payer: Medicaid Other | Admitting: Physical Medicine and Rehabilitation

## 2020-01-24 ENCOUNTER — Telehealth: Payer: Self-pay | Admitting: *Deleted

## 2020-01-24 NOTE — Telephone Encounter (Signed)
Can't put in therapy referral because haven't seen pt lately- he missed a recent appointment- Happy to at his next visit- ML

## 2020-01-24 NOTE — Telephone Encounter (Signed)
Sheria Lang called for refill for her father Andrew Bruce on his tramadol.  Per PMP his last refill was 11/19/19 #20. I informed her that there is another Rx at the pharmacy with refills that was sent in by [name] 11/19/19 #120 5 RF.  She asks if another referral can be placed for PT as he is having problems with his hands.  His next appt is 02/18/20.

## 2020-01-24 NOTE — Telephone Encounter (Signed)
Notified. 

## 2020-01-25 NOTE — Telephone Encounter (Signed)
Daughter called back stating no prescription at the pharmacy. I called the pharmacy and the pharmacy tech states that a note from the pharmacist states that patient agreed to pick up #20 a 5 day supply and void the rest of the prescription so the pharmacy has no prescription for Tramadol for patient. I called daughter back and she said she don't recall. Patient states he is in pain and needs the medication.

## 2020-01-28 ENCOUNTER — Other Ambulatory Visit: Payer: Self-pay | Admitting: *Deleted

## 2020-01-28 MED ORDER — TRAMADOL HCL 50 MG PO TABS: 50.0000 mg | ORAL_TABLET | Freq: Four times a day (QID) | ORAL | 5 refills | Status: DC | PRN

## 2020-01-28 NOTE — Telephone Encounter (Signed)
Ms Cadenhead called back about Mr Comas tramadol.  By PMP he filled #20 in November off the Rx [name] sent in and [facility] deleted the Rx from system.  I have verified and called a new Rx to [facility] with the 5 refills and notified Ms Schrieber.  He has an appt coming up 02/18/20 with [name].

## 2020-02-18 ENCOUNTER — Encounter
Payer: Medicaid Other | Attending: Physical Medicine and Rehabilitation | Admitting: Physical Medicine and Rehabilitation

## 2020-02-18 ENCOUNTER — Other Ambulatory Visit: Payer: Self-pay

## 2020-02-18 ENCOUNTER — Encounter: Payer: Self-pay | Admitting: Physical Medicine and Rehabilitation

## 2020-02-18 VITALS — BP 198/133 | HR 89 | Temp 98.4°F | Resp 18 | Ht 66.0 in | Wt 193.6 lb

## 2020-02-18 DIAGNOSIS — I10 Essential (primary) hypertension: Secondary | ICD-10-CM | POA: Diagnosis not present

## 2020-02-18 DIAGNOSIS — Z7409 Other reduced mobility: Secondary | ICD-10-CM

## 2020-02-18 DIAGNOSIS — I96 Gangrene, not elsewhere classified: Secondary | ICD-10-CM | POA: Diagnosis not present

## 2020-02-18 DIAGNOSIS — R262 Difficulty in walking, not elsewhere classified: Secondary | ICD-10-CM | POA: Diagnosis not present

## 2020-02-18 DIAGNOSIS — G8191 Hemiplegia, unspecified affecting right dominant side: Secondary | ICD-10-CM | POA: Diagnosis not present

## 2020-02-18 DIAGNOSIS — U099 Post covid-19 condition, unspecified: Secondary | ICD-10-CM

## 2020-02-18 MED ORDER — METOPROLOL TARTRATE 25 MG PO TABS: 25.0000 mg | ORAL_TABLET | Freq: Two times a day (BID) | ORAL | 5 refills | Status: DC

## 2020-02-18 MED ORDER — DULOXETINE HCL 30 MG PO CPEP
30.0000 mg | ORAL_CAPSULE | Freq: Every day | ORAL | 5 refills | Status: DC
Start: 2020-02-18 — End: 2020-04-21

## 2020-02-18 MED ORDER — PROMETHAZINE HCL 12.5 MG PO TABS
12.5000 mg | ORAL_TABLET | Freq: Four times a day (QID) | ORAL | 1 refills | Status: DC | PRN
Start: 2020-02-18 — End: 2020-04-12

## 2020-02-18 MED ORDER — TRAMADOL HCL 50 MG PO TABS: 100.0000 mg | ORAL_TABLET | Freq: Three times a day (TID) | ORAL | 5 refills | Status: DC | PRN

## 2020-02-18 NOTE — Patient Instructions (Signed)
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    1. HTN- very uncontrolled in spite of being compliant with meds- at high risk for another stroke.  BP was 198/133 today even taking Metoprolol 12.5 mg BID- will increase to 25 mg 2x/day- para la presion arterial- call primary doctor to get earlier appointment- [place]!!!! If goes down on his BP meds TOO fast, could also cause a stroke-   2. Since Tramadol isn't helping ENOUGH-  Duloxetine- 30 mg nightly for nerve pain- x 1 week  Then 60 mg nightly- dolor del nervio  3. Renew and increase Tramadol 100 mg 3x/day as needed- max dose 100 mg (2 tabs) 3x/day. - for leg pain.  Pierna Dolor  4.  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   5. Pt still hasn't heard from disability- Happy to fill out paperwork to get him whatever needs for disability.   6. Long COVID can cause brain fog, weird sense of smell or taste, headaches, and very bad cough that just  won't stop; increases risk for Stroke and other vascular issues.  (has HTN/and DM).   7. Needs HbA1c checked by PCP and titrated along with Better BP control-   8. Have PCP call me if any questions- [phone].   9. Get COVID vaccine- please  10. F/U in 2 months- call if any questions. If Duloxetine doesn't work/has side effects.   11. Send in Rx for Phenergan 12.5 mg q6 hours as needed- for Nausea

## 2020-02-18 NOTE — Progress Notes (Signed)
Subjective:    Patient ID: Andrew Bruce, male    DOB: 03-Aug-1965, 55 y.o.   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   Has been taking meds as prescribed since November Found a PCP-  [facility] 3rd floor- is where they are- doesn't remember their name.  Saw 2 months ago and plan to see again next month.    Eyes feel heavy- and wants to keep them shut.  Right now- every day.  Tired- every day.   Legs have been hurting- and stretching to get relief.  Both legs are tight.   Still tired when takes Tramadol- takes as needed- so taking a little more than prescribed.   Able to raise B/L shoulders now- if hurts some- takes 1 tramadol- if hurts a LOT- takes 2 tabs-    Feet feel swollen- but not swollen!   Tramadol Rx Remaining: 12 tabs Prescribed: 01/29/2020 Taking faster than expected.      Pain Inventory Average Pain 5 Pain Right Now 5 My pain is constant and dull  In the last 24 hours, has pain interfered with the following? General activity 0 Relation with others 0 Enjoyment of life 0 What TIME of day is your pain at its worst? evening Sleep (in general) Poor  Pain is worse with: walking Pain improves with: Has not tried anthing other than medication. Relief from Meds: 7  Family History  Problem Relation Age of Onset  . Healthy Sister   . Healthy Brother    Social History   Socioeconomic History  . Marital status: Married    Spouse name: Not on file  . Number of children: Not on file  . Years of education: Not on file  . Highest education level: Not on file  Occupational History  . Not on file  Tobacco Use  . Smoking status: Former Games developer  . Smokeless tobacco: Never Used  Vaping Use  . Vaping Use: Never used  Substance and Sexual Activity  . Alcohol use: Yes    Comment: rare to occasional  . Drug use: Never  . Sexual activity:  Yes  Other Topics Concern  . Not on file  Social History Narrative  . Not on file   Social Determinants of Health   Financial Resource Strain: Not on file  Food Insecurity: Not on file  Transportation Needs: Not on file  Physical Activity: Not on file  Stress: Not on file  Social Connections: Not on file   Past Surgical History:  Procedure Laterality Date  . AMPUTATION Right 08/13/2019   Procedure: AMPUTATION RIGHT FIRST AND SECOND TOES;  Surgeon: Larina Earthly, MD;  Location: [facility] OR;  Service: Vascular;  Laterality: Right;  . DIRECT LARYNGOSCOPY Bilateral 04/06/2019   Procedure: MICRO DIRECT LARYNGOSCOPY WITH PROLARYN INJECTION;  Surgeon: Christia Reading, MD;  Location: [facility] OR;  Service: ENT;  Laterality: Bilateral;  . IR GASTROSTOMY TUBE MOD SED  03/10/2019  . IR GASTROSTOMY TUBE REMOVAL  04/20/2019  . WOUND DEBRIDEMENT Left 08/13/2019   Procedure: AMPUTATION  OF LEFT FIRST TOE, AND DEBRIDEMENT OF LEFT SECOND, THIRD AND FOURTH TOES;  Surgeon: Larina Earthly, MD;  Location: [facility] OR;  Service: Vascular;  Laterality: Left;   Past Surgical History:  Procedure Laterality Date  . AMPUTATION Right 08/13/2019   Procedure: AMPUTATION RIGHT FIRST AND SECOND TOES;  Surgeon: Larina Earthly, MD;  Location: [facility] OR;  Service: Vascular;  Laterality: Right;  . DIRECT LARYNGOSCOPY Bilateral 04/06/2019   Procedure: MICRO DIRECT LARYNGOSCOPY WITH PROLARYN INJECTION;  Surgeon: Christia Reading, MD;  Location: [facility] OR;  Service: ENT;  Laterality: Bilateral;  . IR GASTROSTOMY TUBE MOD SED  03/10/2019  . IR GASTROSTOMY TUBE REMOVAL  04/20/2019  . WOUND DEBRIDEMENT Left 08/13/2019   Procedure: AMPUTATION  OF LEFT FIRST TOE, AND DEBRIDEMENT OF LEFT SECOND, THIRD AND FOURTH TOES;  Surgeon: Larina Earthly, MD;  Location: [facility] OR;  Service: Vascular;  Laterality: Left;   Past Medical History:  Diagnosis Date  . COVID-19 02/06/2019  . Diabetes mellitus without complication (HCC)   . Hypertension   . PE (pulmonary thromboembolism)  (HCC)    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  . Pneumonia    with Covid  . Stroke [facility])    weakness on right side, occurred while he had Covid   There were no vitals taken for this visit.  Opioid Risk Score:   Fall Risk Score:  `1  Depression screen PHQ 2/9  Depression screen [facility] 2/9 06/25/2019 06/10/2019 05/13/2019  Decreased Interest 0 0 0  Down, Depressed, Hopeless 0 0 0  PHQ - 2 Score 0 0 0   Review of Systems    an entire Review of systems was completed and found to be negative, except for HPI.   Objective:   Physical Exam  BP 198/133 Of note, had pt take 1 more 1/2 tab of Metoprolol in appointment.  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    1. HTN- very uncontrolled in spite of being compliant with meds- at high risk for another stroke.  BP was 198/133 today even taking Metoprolol 12.5 mg BID- will increase to 25 mg 2x/day- para la presion arterial- call primary doctor to get earlier appointment- [place]!!!! If goes down on his BP meds TOO fast, could also cause a stroke-   2. Since Tramadol isn't helping ENOUGH-  Duloxetine- 30 mg nightly for nerve pain- x 1 week  Then 60 mg nightly- dolor del nervio  3. Renew and increase Tramadol 100 mg 3x/day as needed- max dose 100 mg (2 tabs) 3x/day. - for leg pain.  Pierna Dolor  4.  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   5. Pt still hasn't heard from disability- Happy to fill out paperwork to get him whatever needs for disability.   6. Long COVID can cause brain fog, weird sense of smell or taste, headaches, and very bad cough that just  won't stop; increases risk for Stroke and other vascular issues.  (has HTN/and  DM).   7. Needs HbA1c checked by PCP and titrated along with Better BP control-   8. Have PCP call me if any questions- [phone].   9. Get COVID vaccine- please  10. F/U in 2 months- call if any questions. If Duloxetine doesn't work/has side effects.   11. Send in Rx for Phenergan 12.5 mg q6 hours as needed- for Nausea   I spent a total of 40 minutes on visit-as detailed above

## 2020-03-06 ENCOUNTER — Encounter: Payer: Self-pay | Admitting: Nurse Practitioner

## 2020-03-06 ENCOUNTER — Other Ambulatory Visit: Payer: Self-pay

## 2020-03-06 ENCOUNTER — Ambulatory Visit (INDEPENDENT_AMBULATORY_CARE_PROVIDER_SITE_OTHER): Payer: Medicaid Other | Admitting: Nurse Practitioner

## 2020-03-06 VITALS — BP 160/91 | HR 83 | Temp 98.1°F | Resp 16 | Ht 66.0 in | Wt 190.2 lb

## 2020-03-06 DIAGNOSIS — D509 Iron deficiency anemia, unspecified: Secondary | ICD-10-CM | POA: Diagnosis not present

## 2020-03-06 DIAGNOSIS — Z8616 Personal history of COVID-19: Secondary | ICD-10-CM | POA: Diagnosis not present

## 2020-03-06 DIAGNOSIS — Z1211 Encounter for screening for malignant neoplasm of colon: Secondary | ICD-10-CM

## 2020-03-06 DIAGNOSIS — E119 Type 2 diabetes mellitus without complications: Secondary | ICD-10-CM

## 2020-03-06 DIAGNOSIS — E1165 Type 2 diabetes mellitus with hyperglycemia: Secondary | ICD-10-CM

## 2020-03-06 DIAGNOSIS — G629 Polyneuropathy, unspecified: Secondary | ICD-10-CM

## 2020-03-06 LAB — POCT GLYCOSYLATED HEMOGLOBIN (HGB A1C): Hemoglobin A1C: 6.9 % — AB (ref 4.0–5.6)

## 2020-03-06 MED ORDER — LISINOPRIL 10 MG PO TABS
10.0000 mg | ORAL_TABLET | Freq: Every day | ORAL | 3 refills | Status: DC
Start: 2020-03-06 — End: 2020-07-20

## 2020-03-06 NOTE — Progress Notes (Signed)
Reports stopped taking iron capsules x 1 wk ago due to severe constipation   Reports bilateral feet swelling x 6 months

## 2020-03-06 NOTE — Patient Instructions (Signed)
Hipertensin en los adultos Hypertension, Adult El trmino hipertensin es otra forma de denominar a la presin arterial elevada. La presin arterial elevada fuerza al corazn a trabajar ms para bombear la sangre. Esto puede causar problemas con el paso del tiempo. Una lectura de presin arterial est compuesta por 2 nmeros. Hay un nmero superior (sistlico) sobre un nmero inferior (diastlico). Lo ideal es tener la presin arterial por debajo de 120/80. Las elecciones saludables pueden ayudar a bajar la presin arterial, o tal vez necesite medicamentos para bajarla. Cules son las causas? Se desconoce la causa de esta afeccin. Algunas afecciones pueden estar relacionadas con la presin arterial alta. Qu incrementa el riesgo?  Fumar.  Tener diabetes mellitus tipo 2, colesterol alto, o ambos.  No hacer la cantidad suficiente de actividad fsica o ejercicio.  Tener sobrepeso.  Consumir mucha grasa, azcar, caloras o sal (sodio) en su dieta.  Beber alcohol en exceso.  Tener una enfermedad renal a largo plazo (crnica).  Tener antecedentes familiares de presin arterial alta.  Edad. Los riesgos aumentan con la edad.  Raza. El riesgo es mayor para las personas afroamericanas.  Sexo. Antes de los 45aos, los hombres corren ms riesgo que las mujeres. Despus de los 65aos, las mujeres corren ms riesgo que los hombres.  Tener apnea obstructiva del sueo.  Estrs. Cules son los signos o los sntomas?  Es posible que la presin arterial alta puede no cause sntomas. La presin arterial muy alta (crisis hipertensiva) puede provocar: ? Dolor de cabeza. ? Sensaciones de preocupacin o nerviosismo (ansiedad). ? Falta de aire. ? Hemorragia nasal. ? Sensacin de malestar en el estmago (nuseas). ? Vmitos. ? Cambios en la forma de ver. ? Dolor muy intenso en el pecho. ? Convulsiones. Cmo se trata?  Esta afeccin se trata haciendo cambios saludables en el estilo de  vida, por ejemplo: ? Consumir alimentos saludables. ? Hacer ms ejercicio. ? Beber menos alcohol.  El mdico puede recetarle medicamentos si los cambios en el estilo de vida no son suficientes para lograr controlar la presin arterial y si: ? El nmero de arriba est por encima de 130. ? El nmero de abajo est por encima de 80.  Su presin arterial personal ideal puede variar. Siga estas instrucciones en su casa: Comida y bebida  Si se lo dicen, siga el plan de alimentacin de DASH (Dietary Approaches to Stop Hypertension, Maneras de alimentarse para detener la hipertensin). Para seguir este plan: ? Llene la mitad del plato de cada comida con frutas y verduras. ? Llene un cuarto del plato de cada comida con cereales integrales. Los cereales integrales incluyen pasta integral, arroz integral y pan integral. ? Coma y beba productos lcteos con bajo contenido de grasa, como leche descremada o yogur bajo en grasas. ? Llene un cuarto del plato de cada comida con protenas bajas en grasa (magras). Las protenas bajas en grasa incluyen pescado, pollo sin piel, huevos, frijoles y tofu. ? Evite consumir carne grasa, carne curada y procesada, o pollo con piel. ? Evite consumir alimentos prehechos o procesados.  Consuma menos de 1500 mg de sal por da.  No beba alcohol si: ? El mdico le indica que no lo haga. ? Est embarazada, puede estar embarazada o est tratando de quedar embarazada.  Si bebe alcohol: ? Limite la cantidad que bebe a lo siguiente:  De 0 a 1 medida por da para las mujeres.  De 0 a 2 medidas por da para los hombres. ? Est atento a la   cantidad de alcohol que hay en las bebidas que toma. En los [place], una medida equivale a una botella de cerveza de 12oz (355ml), un vaso de vino de 5oz (148ml) o un vaso de una bebida alcohlica de alta graduacin de 1oz (44ml).   Estilo de vida  Trabaje con su mdico para mantenerse en un peso saludable o para perder  peso. Pregntele a su mdico cul es el peso recomendable para usted.  Haga al menos 30minutos de ejercicio la mayora de los das de la semana. Estos pueden incluir caminar, nadar o andar en bicicleta.  Realice al menos 30 minutos de ejercicio que fortalezca sus msculos (ejercicios de resistencia) al menos 3 das a la semana. Estos pueden incluir levantar pesas o hacer Pilates.  No consuma ningn producto que contenga nicotina o tabaco, como cigarrillos, cigarrillos electrnicos y tabaco de mascar. Si necesita ayuda para dejar de fumar, consulte al mdico.  Controle su presin arterial en su casa tal como le indic el mdico.  Concurra a todas las visitas de seguimiento como se lo haya indicado el mdico. Esto es importante.   Medicamentos  Tome los medicamentos de venta libre y los recetados solamente como se lo haya indicado el mdico. Siga cuidadosamente las indicaciones.  No omita las dosis de medicamentos para la presin arterial. Los medicamentos pierden eficacia si omite dosis. El hecho de omitir las dosis tambin aumenta el riesgo de otros problemas.  Pregntele a su mdico a qu efectos secundarios o reacciones a los medicamentos debe prestar atencin. Comunquese con un mdico si:  Piensa que tiene una reaccin a los medicamentos que est tomando.  Tiene dolores de cabeza frecuentes (recurrentes).  Se siente mareado.  Tiene hinchazn en los tobillos.  Tiene problemas de visin. Solicite ayuda inmediatamente si:  Siente un dolor de cabeza muy intenso.  Empieza a sentirse desorientado (confundido).  Se siente dbil o adormecido.  Siente que va a desmayarse.  Tiene un dolor muy intenso en las siguientes zonas: ? Pecho. ? Vientre (abdomen).  Vomita ms de una vez.  Tiene dificultad para respirar. Resumen  El trmino hipertensin es otra forma de denominar a la presin arterial elevada.  La presin arterial elevada fuerza al corazn a trabajar ms para  bombear la sangre.  Para la mayora de las personas, una presin arterial normal es menor que 120/80.  Las decisiones saludables pueden ayudarle a disminuir su presin arterial. Si no puede bajar su presin arterial mediante decisiones saludables, es posible que deba tomar medicamentos. Esta informacin no tiene como fin reemplazar el consejo del mdico. Asegrese de hacerle al mdico cualquier pregunta que tenga. Document Revised: 10/16/2017 Document Reviewed: 10/16/2017 Elsevier Patient Education  2021 Elsevier Inc.  

## 2020-03-06 NOTE — Progress Notes (Signed)
Established Patient Office Visit  Subjective:  Patient ID: Andrew Bruce, male    DOB: 02-27-1965  Age: 55 y.o. MRN: [id number]  CC:  Chief Complaint  Patient presents with  . Follow-up    HPI Andrew Bruce presents for follow up. He  has a past medical history of COVID-19 (02/06/2019), Diabetes mellitus without complication ([place]), Hypertension, PE (pulmonary thromboembolism) ([place]), Pneumonia, and Stroke ([place]).   Diabetes Mellitus Patient presents for follow up of diabetes. He is today with spouse and stratus.  Current symptoms include: paresthesia of the feet. Symptoms have stabilized. Patient denies foot ulcerations, hyperglycemia, hypoglycemia , increased appetite, nausea, polydipsia, polyuria, vomiting and weight loss. Evaluation to date has included: fasting blood sugar, fasting lipid panel, hemoglobin A1C and microalbuminuria.  Home sugars: BGs have been labile ranging between 120 and 160. Current treatment: Continued sulfonylurea which has been effective, Continued metformin which has been effective, Continued statin which has been effective and Continued ACE inhibitor/ARB which has been not very effective. He admits that his BP is elevated because of the stress of his hospital bills.   Past Medical History:  Diagnosis Date  . COVID-19 02/06/2019  . Diabetes mellitus without complication ([place])   . Hypertension   . PE (pulmonary thromboembolism) ([place])    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  . Pneumonia    with Covid  . Stroke [facility])    weakness on right side, occurred while he had Covid    Past Surgical History:  Procedure Laterality Date  . AMPUTATION Right 08/13/2019   Procedure: AMPUTATION RIGHT FIRST AND SECOND TOES;  Surgeon: Rosetta Posner, MD;  Location: [place];  Service: Vascular;  Laterality: Right;  . DIRECT LARYNGOSCOPY Bilateral 04/06/2019   Procedure: MICRO DIRECT LARYNGOSCOPY WITH PROLARYN INJECTION;  Surgeon: Melida Quitter, MD;  Location:  [place];  Service: ENT;  Laterality: Bilateral;  . IR GASTROSTOMY TUBE MOD SED  03/10/2019  . IR GASTROSTOMY TUBE REMOVAL  04/20/2019  . WOUND DEBRIDEMENT Left 08/13/2019   Procedure: AMPUTATION  OF LEFT FIRST TOE, AND DEBRIDEMENT OF LEFT SECOND, THIRD AND FOURTH TOES;  Surgeon: Rosetta Posner, MD;  [facility];  Service: Vascular;  Laterality: Left;    Family History  Problem Relation Age of Onset  . Healthy Sister   . Healthy Brother     Social History   Socioeconomic History  . Marital status: Married    Spouse name: Not on file  . Number of children: Not on file  . Years of education: Not on file  . Highest education level: Not on file  Occupational History  . Not on file  Tobacco Use  . Smoking status: Former Research scientist (life sciences)  . Smokeless tobacco: Never Used  Vaping Use  . Vaping Use: Never used  Substance and Sexual Activity  . Alcohol use: Yes    Comment: rare to occasional  . Drug use: Never  . Sexual activity: Yes  Other Topics Concern  . Not on file  Social History Narrative  . Not on file   Social Determinants of Health   Financial Resource Strain: Not on file  Food Insecurity: Not on file  Transportation Needs: Not on file  Physical Activity: Not on file  Stress: Not on file  Social Connections: Not on file  Intimate Partner Violence: Not on file    Outpatient Medications Prior to Visit  Medication Sig Dispense Refill  . acetaminophen (TYLENOL) 325 MG tablet Take 1-2 tablets (  325-650 mg total) by mouth every 4 (four) hours as needed for mild pain. 100 tablet 0  . apixaban (ELIQUIS) 5 MG TABS tablet Take 1 tablet (5 mg total) by mouth 2 (two) times daily. 60 tablet 1  . atorvastatin (LIPITOR) 20 MG tablet Take 1 tablet (20 mg total) by mouth daily at 6 PM. 30 tablet 5  . blood glucose meter kit and supplies KIT Dispense based on patient and insurance preference. Use up to four times daily as directed. (FOR ICD-9 250.00, 250.01). 1 each 0  . chlorhexidine (PERIDEX)  0.12 % solution 15 mLs 2 (two) times daily.    . DULoxetine (CYMBALTA) 30 MG capsule Take 1 capsule (30 mg total) by mouth at bedtime. X 1 week then 2 capsules- 60 mg nightly- if it wakes you up, take in morning- for nerve pain 60 capsule 5  . glipiZIDE (GLUCOTROL) 5 MG tablet Take 0.5 tablets (2.5 mg total) by mouth daily before breakfast. 15 tablet 5  . ibuprofen (ADVIL) 800 MG tablet Take 800 mg by mouth every 6 (six) hours as needed.    . metFORMIN (GLUCOPHAGE) 500 MG tablet Take 1 tablet (500 mg total) by mouth 2 (two) times daily with a meal. 60 tablet 5  . metoprolol tartrate (LOPRESSOR) 25 MG tablet Take 1 tablet (25 mg total) by mouth 2 (two) times daily. For blood pressure 60 tablet 5  . potassium chloride (KLOR-CON) 10 MEQ tablet Take 1 tablet (10 mEq total) by mouth daily. 30 tablet 0  . promethazine (PHENERGAN) 12.5 MG tablet Take 1 tablet (12.5 mg total) by mouth every 6 (six) hours as needed for nausea or vomiting. 60 tablet 1  . traMADol (ULTRAM) 50 MG tablet Take 2 tablets (100 mg total) by mouth 3 (three) times daily as needed for moderate pain or severe pain. 180 tablet 5  . traZODone (DESYREL) 50 MG tablet Take 0.5 tablets (25 mg total) by mouth at bedtime as needed for sleep. 15 tablet 0  . doxazosin (CARDURA) 1 MG tablet Take 1 tablet (1 mg total) by mouth at bedtime. (Patient not taking: No sig reported) 30 tablet 0  . famotidine (PEPCID) 20 MG tablet Take 1 tablet (20 mg total) by mouth 2 (two) times daily. (Patient not taking: No sig reported) 60 tablet 1  . glipiZIDE (GLUCOTROL) 5 MG tablet Take 0.5 tablets (2.5 mg total) by mouth daily before breakfast. (Patient not taking: Reported on 03/08/2020) 30 tablet 0  . iron polysaccharides (NIFEREX) 150 MG capsule Take 1 capsule (150 mg total) by mouth 2 (two) times daily. (Patient not taking: No sig reported) 60 capsule 0   No facility-administered medications prior to visit.    No Known Allergies  ROS Review of Systems   Constitutional: Negative.   HENT: Positive for voice change. Negative for trouble swallowing.   Eyes: Positive for visual disturbance.  Respiratory: Positive for chest tightness and shortness of breath (only with speaking ).   Cardiovascular: Positive for leg swelling (feet).  Gastrointestinal: Negative for diarrhea, nausea and vomiting.  Endocrine: Negative.   Genitourinary: Negative.   Allergic/Immunologic: Negative.   Neurological: Positive for numbness.      Objective:    Physical Exam HENT:     Head: Normocephalic and atraumatic.  Cardiovascular:     Rate and Rhythm: Normal rate and regular rhythm.     Pulses: Normal pulses.     Heart sounds: Normal heart sounds.  Pulmonary:     Effort: Pulmonary effort  is normal.     Breath sounds: Normal breath sounds.  Abdominal:     Palpations: Abdomen is soft.  Musculoskeletal:        General: Normal range of motion.     Cervical back: Normal range of motion.     Right Lower Extremity: (right foot amputation 1&2 left to 1 and 2 half ) Skin:    General: Skin is warm and dry.     Capillary Refill: Capillary refill takes less than 2 seconds.  Neurological:     General: No focal deficit present.     Mental Status: He is alert and oriented to person, place, and time.  Psychiatric:        Mood and Affect: Mood normal.        Behavior: Behavior normal.        Thought Content: Thought content normal.        Judgment: Judgment normal.     BP (!) 160/91 (BP Location: Right Arm, Patient Position: Sitting, Cuff Size: Normal)   Pulse 83   Temp 98.1 F (36.7 C) (Temporal)   Resp 16   Ht _0  (1.676 m)   Wt 190 lb 3.2 oz (86.3 kg)   SpO2 99%   BMI 30.70 kg/m  Wt Readings from Last 3 Encounters:  03/06/20 190 lb 3.2 oz (86.3 kg)  02/18/20 193 lb 9.6 oz (87.8 kg)  11/19/19 186 lb 9.6 oz (84.6 kg)     Health Maintenance Due  Topic Date Due  . FOOT EXAM  Never done    There are no preventive care reminders to display for  this patient.  Lab Results  Component Value Date   TSH 4.156 03/21/2019   Lab Results  Component Value Date   WBC 6.7 03/06/2020   HGB 14.7 03/06/2020   HCT 43.5 03/06/2020   MCV 85 03/06/2020   PLT 314 03/06/2020   Lab Results  Component Value Date   NA 140 03/06/2020   K 4.3 03/06/2020   CO2 22 08/13/2019   GLUCOSE 90 03/06/2020   BUN 15 03/06/2020   CREATININE 0.77 03/06/2020   BILITOT 0.6 03/06/2020   ALKPHOS 81 03/06/2020   AST 16 03/06/2020   ALT 15 08/13/2019   PROT 7.4 03/06/2020   ALBUMIN 4.7 03/06/2020   CALCIUM 9.5 03/06/2020   ANIONGAP 12 08/13/2019   Lab Results  Component Value Date   CHOL 134 02/17/2019   Lab Results  Component Value Date   HDL 33 (L) 02/17/2019   Lab Results  Component Value Date   LDLCALC 75 02/17/2019   Lab Results  Component Value Date   TRIG 551 (H) 03/01/2019   Lab Results  Component Value Date   CHOLHDL 4.1 02/17/2019   Lab Results  Component Value Date   HGBA1C 6.9 (A) 03/06/2020      Assessment & Plan:   Problem List Items Addressed This Visit      Endocrine   Uncontrolled type 2 diabetes mellitus with hyperglycemia ([place]) Stable at goal Encourage compliance with current treatment regimen   Encourage regular CBG monitoring Encourage contacting office if excessive hyperglycemia and or hypoglycemia Lifestyle modification with healthy diet (fewer calories, more high fiber foods, whole grains and non-starchy vegetables, lower fat meat and fish, low-fat diary include healthy oils) regular exercise (physical activity) and weight loss Opthalmology exam discussed  Regular dental visits encouraged Home BP monitoring also encouraged goal <130/80     Relevant Medications   lisinopril (ZESTRIL) 10  MG tablet   Other Relevant Orders   Comp. Metabolic Panel (12) (Completed)    Other Visit Diagnoses    DM type 2, goal HbA1c < 7% (HCC)    -  Primary   Relevant Medications   lisinopril (ZESTRIL) 10 MG tablet    Other Relevant Orders   HgB A1c (Completed)   Neuropathy       Personal history of COVID-19       Iron deficiency anemia, unspecified iron deficiency anemia type     Labs pending   Relevant Orders   CBC with Differential/Platelet (Completed)   Colon cancer screening       Relevant Orders   Ambulatory referral to Gastroenterology      Meds ordered this encounter  Medications  . lisinopril (ZESTRIL) 10 MG tablet    Sig: Take 1 tablet (10 mg total) by mouth daily.    Dispense:  90 tablet    Refill:  3    Order Specific Question:   Supervising Provider    Answer:   Tresa Garter [id number]    Follow-up: Return in about 6 weeks (around 04/17/2020).    Vevelyn Francois, NP

## 2020-03-07 LAB — COMP. METABOLIC PANEL (12)
AST: 16 IU/L (ref 0–40)
Albumin/Globulin Ratio: 1.7 (ref 1.2–2.2)
Albumin: 4.7 g/dL (ref 3.8–4.9)
Alkaline Phosphatase: 81 IU/L (ref 44–121)
BUN/Creatinine Ratio: 19 (ref 9–20)
BUN: 15 mg/dL (ref 6–24)
Bilirubin Total: 0.6 mg/dL (ref 0.0–1.2)
Calcium: 9.5 mg/dL (ref 8.7–10.2)
Chloride: 102 mmol/L (ref 96–106)
Creatinine, Ser: 0.77 mg/dL (ref 0.76–1.27)
GFR calc Af Amer: 120 mL/min/{1.73_m2} (ref >59)
GFR calc non Af Amer: 104 mL/min/{1.73_m2} (ref >59)
Globulin, Total: 2.7 g/dL (ref 1.5–4.5)
Glucose: 90 mg/dL (ref 65–99)
Potassium: 4.3 mmol/L (ref 3.5–5.2)
Sodium: 140 mmol/L (ref 134–144)
Total Protein: 7.4 g/dL (ref 6.0–8.5)

## 2020-03-07 LAB — CBC WITH DIFFERENTIAL/PLATELET
Basophils Absolute: 0 10*3/uL (ref 0.0–0.2)
Basos: 1 %
EOS (ABSOLUTE): 0.1 10*3/uL (ref 0.0–0.4)
Eos: 1 %
Hematocrit: 43.5 % (ref 37.5–51.0)
Hemoglobin: 14.7 g/dL (ref 13.0–17.7)
Immature Grans (Abs): 0 10*3/uL (ref 0.0–0.1)
Immature Granulocytes: 0 %
Lymphocytes Absolute: 2.3 10*3/uL (ref 0.7–3.1)
Lymphs: 35 %
MCH: 28.8 pg (ref 26.6–33.0)
MCHC: 33.8 g/dL (ref 31.5–35.7)
MCV: 85 fL (ref 79–97)
Monocytes Absolute: 0.5 10*3/uL (ref 0.1–0.9)
Monocytes: 8 %
Neutrophils Absolute: 3.7 10*3/uL (ref 1.4–7.0)
Neutrophils: 55 %
Platelets: 314 10*3/uL (ref 150–450)
RBC: 5.11 x10E6/uL (ref 4.14–5.80)
RDW: 12.8 % (ref 11.6–15.4)
WBC: 6.7 10*3/uL (ref 3.4–10.8)

## 2020-03-14 ENCOUNTER — Telehealth: Payer: Self-pay

## 2020-03-14 NOTE — Telephone Encounter (Signed)
Attempted to reach patient using interpreter service.  Left message saying labs are within normal range.

## 2020-03-14 NOTE — Telephone Encounter (Signed)
-----   Message from Barbette Merino, NP sent at 03/13/2020 12:13 PM EST ----- Please make patient aware that his labs are all within a normal range

## 2020-03-17 ENCOUNTER — Encounter: Payer: Self-pay | Admitting: Internal Medicine

## 2020-03-28 ENCOUNTER — Encounter: Payer: Self-pay | Admitting: Nurse Practitioner

## 2020-04-12 ENCOUNTER — Other Ambulatory Visit: Payer: Self-pay

## 2020-04-12 ENCOUNTER — Encounter: Payer: Self-pay | Admitting: Nurse Practitioner

## 2020-04-12 ENCOUNTER — Ambulatory Visit (INDEPENDENT_AMBULATORY_CARE_PROVIDER_SITE_OTHER): Payer: Medicaid Other | Admitting: Nurse Practitioner

## 2020-04-12 VITALS — BP 146/84 | HR 67 | Wt 195.0 lb

## 2020-04-12 DIAGNOSIS — Z1211 Encounter for screening for malignant neoplasm of colon: Secondary | ICD-10-CM

## 2020-04-12 NOTE — Patient Instructions (Addendum)
Si tiene 64 aos o menos, su ndice de [employer] corporal debe estar entre 19 y 47. Su ndice de masa corporal es de 31,47 kg/m. Si esto est fuera del rango mencionado anteriormente, considere hacer un seguimiento con su proveedor de Marine scientist.  PROCEDIMIENTOS: Andrew Bruce han programado una colonoscopia. Siga las instrucciones escritas que se le dieron en su visita de hoy. Si [place] inhaladores (aunque solo sea necesario), trigalos el da de su procedimiento.   Fue genial verte hoy! Gracias por confiarme su atencin y elegir [facility].  Darron Doom, NP

## 2020-04-12 NOTE — Progress Notes (Signed)
ASSESSMENT AND PLAN     # Colon cancer screening.  No alarm features.  Our office made this appointment prior to scheduling patient for a colonoscopy because we thought he was on Eliquis. It turns out that Eliquis was discontinued 6 months ago.  --Patient will be scheduled for a colonoscopy. The risks and benefits of colonoscopy with possible polypectomy / biopsies were discussed and the patient agrees to proceed.    # History of COVID-19 PNA March 2019. 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  He also underwent PEG placement. 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  Treated with Eliquis for 6 months  # Hoarse voice.  Inpatient evaluation showed some vocal cord dysfunction.   HISTORY OF PRESENT ILLNESS     Chief Complaint : Colon cancer screening  Andrew Bruce is a 55 y.o. male with a past medical history significant for HTN, DM2, [id number] complicated by respiratory failure, multiple thromboembolic complications including an aortic thrombus, PE, CVA   Patient is non-English speaking, here with an interpreter.  He is referred by PCP for colon cancer screening  Patient has no [facility] of colon cancer.  He denies bowel changes, abdominal pain or  blood in stool.     Data Reviewed: 03/06/20  hgb 14.7, platelets 314 Hgb A1c  6.9    Past Medical History:  Diagnosis Date  . COVID-19 02/06/2019  . Diabetes mellitus without complication ([place])   . Hypertension   . 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 Pneumonia    with Covid  . Stroke [facility])    weakness on right side, occurred while he had Covid     Past Surgical History:  Procedure Laterality Date  . AMPUTATION Right 08/13/2019   Procedure: AMPUTATION RIGHT FIRST AND SECOND TOES;  Surgeon:  Rosetta Posner, MD;  Location: [place];  Service: Vascular;  Laterality: Right;  . DIRECT LARYNGOSCOPY Bilateral 04/06/2019   Procedure: MICRO DIRECT LARYNGOSCOPY WITH PROLARYN INJECTION;  Surgeon: Melida Quitter, MD;  Location: [place];  Service: ENT;  Laterality: Bilateral;  . IR GASTROSTOMY TUBE MOD SED  03/10/2019  . IR GASTROSTOMY TUBE REMOVAL  04/20/2019  . WOUND DEBRIDEMENT Left 08/13/2019   Procedure: AMPUTATION  OF LEFT FIRST TOE, AND DEBRIDEMENT OF LEFT SECOND, THIRD AND FOURTH TOES;  Surgeon: Rosetta Posner, MD;  [facility];  Service: Vascular;  Laterality: Left;   Family History  Problem Relation Age of Onset  . Healthy Sister   . Healthy Brother    Social History   Tobacco Use  . Smoking status: Former Research scientist (life sciences)  . Smokeless tobacco: Never Used  Vaping Use  . Vaping Use: Never used  Substance Use Topics  . Alcohol use: Yes    Comment: rare to occasional  . Drug use: Never   Current Outpatient Medications  Medication Sig Dispense Refill  . blood glucose meter kit and supplies KIT Dispense based on patient and insurance preference. Use up to four times daily as directed. (FOR ICD-9 250.00, 250.01). 1 each 0  . DULoxetine (CYMBALTA) 30 MG capsule Take 1 capsule (30 mg total) by mouth at bedtime. X 1 week then 2 capsules- 60 mg nightly- if it wakes you up, take in morning- for nerve pain 60 capsule 5  . glipiZIDE (GLUCOTROL) 5 MG tablet  Take 0.5 tablets (2.5 mg total) by mouth daily before breakfast. 15 tablet 5  . lisinopril (ZESTRIL) 10 MG tablet Take 1 tablet (10 mg total) by mouth daily. 90 tablet 3  . metFORMIN (GLUCOPHAGE) 500 MG tablet Take 1 tablet (500 mg total) by mouth 2 (two) times daily with a meal. 60 tablet 5  . metoprolol tartrate (LOPRESSOR) 25 MG tablet Take 1 tablet (25 mg total) by mouth 2 (two) times daily. For blood pressure 60 tablet 5  . traMADol (ULTRAM) 50 MG tablet Take 2 tablets (100 mg total) by mouth 3 (three) times daily as needed for moderate pain or  severe pain. 180 tablet 5   No current facility-administered medications for this visit.   No Known Allergies   Review of Systems: Positive for body aches, depression, swelling of feet and legs.   All other systems reviewed and negative except where noted in HPI.   PHYSICAL EXAM :    Wt Readings from Last 3 Encounters:  04/12/20 195 lb (88.5 kg)  03/06/20 190 lb 3.2 oz (86.3 kg)  02/18/20 193 lb 9.6 oz (87.8 kg)    BP (!) 146/84   Pulse 67   Wt 195 lb (88.5 kg)   BMI 31.47 kg/m  Constitutional:  Pleasant well developed male in no acute distress. Psychiatric: Normal mood and affect. Behavior is normal. EENT: Pupils normal.  Conjunctivae are normal. No scleral icterus. Neck supple.  Cardiovascular: Normal rate, regular rhythm. No edema Pulmonary/chest: Effort normal and breath sounds normal. No wheezing, rales or rhonchi. Abdominal: Soft, nondistended, nontender. Bowel sounds active throughout. There are no masses palpable. No hepatomegaly. Neurological: Alert and oriented to person place and time. Skin: Skin is warm and dry. No rashes noted.  [name], NP  04/12/2020, 3:56 PM  Cc:  Referring Provider Vevelyn Francois, NP

## 2020-04-17 ENCOUNTER — Encounter: Payer: Self-pay | Admitting: Nurse Practitioner

## 2020-04-17 ENCOUNTER — Ambulatory Visit (INDEPENDENT_AMBULATORY_CARE_PROVIDER_SITE_OTHER): Payer: Medicaid Other | Admitting: Nurse Practitioner

## 2020-04-17 ENCOUNTER — Other Ambulatory Visit: Payer: Self-pay

## 2020-04-17 VITALS — BP 144/88 | HR 71 | Temp 98.1°F | Ht 66.0 in | Wt 194.0 lb

## 2020-04-17 DIAGNOSIS — I10 Essential (primary) hypertension: Secondary | ICD-10-CM | POA: Diagnosis not present

## 2020-04-17 DIAGNOSIS — G629 Polyneuropathy, unspecified: Secondary | ICD-10-CM

## 2020-04-17 DIAGNOSIS — J3800 Paralysis of vocal cords and larynx, unspecified: Secondary | ICD-10-CM | POA: Diagnosis not present

## 2020-04-17 DIAGNOSIS — S98131A Complete traumatic amputation of one right lesser toe, initial encounter: Secondary | ICD-10-CM | POA: Diagnosis not present

## 2020-04-17 DIAGNOSIS — J301 Allergic rhinitis due to pollen: Secondary | ICD-10-CM

## 2020-04-17 NOTE — Progress Notes (Signed)
Reviewed and agree with management plans. ? ?Zeenat Jeanbaptiste L. Raden Byington, MD, MPH  ?

## 2020-04-17 NOTE — Progress Notes (Signed)
[place], [place]  [address] Phone:  [phone]   Fax:  [phone]   Established Patient Office Visit  Subjective:  Patient ID: Andrew Bruce, male    DOB: 1965/11/19  Age: 55 y.o. MRN: [id number]  CC:  Chief Complaint  Patient presents with  . Follow-up    Follow up , 6 week follow up , eyes  dry and heavy  ,runny nose, drainage back of throat  need a referral to dr duda , having pain when talking from having trach     HPI Andrew Bruce presents for follow up. He  has a past medical history of COVID-19 (02/06/2019), Diabetes mellitus without complication ([facility]), Hypertension, PE (pulmonary thromboembolism) ([place]), Pneumonia, and Stroke ([place]).   Hypertension Patient is here for follow-up of elevated blood pressure. He is not exercising and is adherent to a low-salt diet. Blood pressure is not monitored at home. Cardiac symptoms: none. Patient denies chest pain, dyspnea, exertional chest pressure/discomfort, near-syncope and palpitations. Cardiovascular risk factors: diabetes mellitus, dyslipidemia, hypertension, male gender and sedentary lifestyle. Use of agents associated with hypertension: none. History of target organ damage: Stroke.  Past Medical History:  Diagnosis Date  . COVID-19 02/06/2019  . Diabetes mellitus without complication ([place])   . Hypertension   . 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 Pneumonia    with Covid  . Stroke [facility])    weakness on right side, occurred while he had Covid    Past Surgical History:  Procedure Laterality Date  . AMPUTATION Right 08/13/2019   Procedure: AMPUTATION RIGHT FIRST AND SECOND TOES;  Surgeon: Rosetta Posner, MD;  Location: [place];  Service: Vascular;  Laterality: Right;  . DIRECT LARYNGOSCOPY Bilateral 04/06/2019   Procedure: MICRO DIRECT LARYNGOSCOPY WITH PROLARYN INJECTION;  Surgeon: Melida Quitter, MD;  Location: [place];   Service: ENT;  Laterality: Bilateral;  . IR GASTROSTOMY TUBE MOD SED  03/10/2019  . IR GASTROSTOMY TUBE REMOVAL  04/20/2019  . WOUND DEBRIDEMENT Left 08/13/2019   Procedure: AMPUTATION  OF LEFT FIRST TOE, AND DEBRIDEMENT OF LEFT SECOND, THIRD AND FOURTH TOES;  Surgeon: Rosetta Posner, MD;  [facility];  Service: Vascular;  Laterality: Left;    Family History  Problem Relation Age of Onset  . Healthy Sister   . Healthy Brother     Social History   Socioeconomic History  . Marital status: Married    Spouse name: Not on file  . Number of children: Not on file  . Years of education: Not on file  . Highest education level: Not on file  Occupational History  . Not on file  Tobacco Use  . Smoking status: Former Research scientist (life sciences)  . Smokeless tobacco: Never Used  Vaping Use  . Vaping Use: Never used  Substance and Sexual Activity  . Alcohol use: Yes    Comment: rare to occasional  . Drug use: Never  . Sexual activity: Yes  Other Topics Concern  . Not on file  Social History Narrative  . Not on file   Social Determinants of Health   Financial Resource Strain: Not on file  Food Insecurity: Not on file  Transportation Needs: Not on file  Physical Activity: Not on file  Stress: Not on file  Social Connections: Not on file  Intimate Partner Violence: Not on file    Outpatient Medications Prior to Visit  Medication Sig Dispense Refill  . blood glucose meter kit and supplies KIT Dispense based on patient and insurance preference. Use up to four times daily as directed. (FOR ICD-9 250.00, 250.01). 1 each 0  . DULoxetine (CYMBALTA) 30 MG capsule Take 1 capsule (30 mg total) by mouth at bedtime. X 1 week then 2 capsules- 60 mg nightly- if it wakes you up, take in morning- for nerve pain 60 capsule 5  . glipiZIDE (GLUCOTROL) 5 MG tablet Take 0.5 tablets (2.5 mg total) by mouth daily before breakfast. 15 tablet 5  . lisinopril (ZESTRIL) 10 MG tablet Take 1 tablet (10 mg total) by mouth daily.  90 tablet 3  . metFORMIN (GLUCOPHAGE) 500 MG tablet Take 1 tablet (500 mg total) by mouth 2 (two) times daily with a meal. 60 tablet 5  . metoprolol tartrate (LOPRESSOR) 25 MG tablet Take 1 tablet (25 mg total) by mouth 2 (two) times daily. For blood pressure 60 tablet 5  . traMADol (ULTRAM) 50 MG tablet Take 2 tablets (100 mg total) by mouth 3 (three) times daily as needed for moderate pain or severe pain. 180 tablet 5   No facility-administered medications prior to visit.    No Known Allergies  ROS Review of Systems  Constitutional: Positive for chills and fever (98 and 99).  HENT: Positive for sore throat.        Sinus stuffiness  Eyes: Positive for pain (heaviness for one month). Negative for photophobia, discharge and visual disturbance.       Exam up to date Eye drops used a little   Respiratory: Positive for cough.   Cardiovascular: Positive for leg swelling.  Neurological: Positive for dizziness, weakness (on eyelids feels like he needs to kep them closed  all the time. ) and headaches (frontal comes and goes; Advil daily).      Objective:    Physical Exam Musculoskeletal:     Right Lower Extremity: Right leg is amputated below ankle. (1 and 2)    Left Lower Extremity: Left leg is amputated below ankle. (1 digit)    BP (!) 144/88 (BP Location: Left Arm, Patient Position: Sitting, Cuff Size: Normal)   Pulse 71   Temp 98.1 F (36.7 C) (Temporal)   Ht 5' 6"  (1.676 m)   Wt 194 lb (88 kg)   SpO2 96%   BMI 31.31 kg/m  Wt Readings from Last 3 Encounters:  04/17/20 194 lb (88 kg)  04/12/20 195 lb (88.5 kg)  03/06/20 190 lb 3.2 oz (86.3 kg)     Health Maintenance Due  Topic Date Due  . COVID-19 Vaccine (1) Never done  . FOOT EXAM  Never done  . OPHTHALMOLOGY EXAM  Never done    There are no preventive care reminders to display for this patient.  Lab Results  Component Value Date   TSH 4.156 03/21/2019   Lab Results  Component Value Date   WBC 6.7  03/06/2020   HGB 14.7 03/06/2020   HCT 43.5 03/06/2020   MCV 85 03/06/2020   PLT 314 03/06/2020   Lab Results  Component Value Date   NA 140 03/06/2020   K 4.3 03/06/2020   CO2 22 08/13/2019   GLUCOSE 90 03/06/2020   BUN 15 03/06/2020   CREATININE 0.77 03/06/2020   BILITOT 0.6 03/06/2020   ALKPHOS 81 03/06/2020   AST 16 03/06/2020   ALT 15 08/13/2019   PROT 7.4 03/06/2020   ALBUMIN 4.7 03/06/2020   CALCIUM 9.5 03/06/2020  ANIONGAP 12 08/13/2019   Lab Results  Component Value Date   CHOL 134 02/17/2019   Lab Results  Component Value Date   HDL 33 (L) 02/17/2019   Lab Results  Component Value Date   LDLCALC 75 02/17/2019   Lab Results  Component Value Date   TRIG 551 (H) 03/01/2019   Lab Results  Component Value Date   CHOLHDL 4.1 02/17/2019   Lab Results  Component Value Date   HGBA1C 6.9 (A) 03/06/2020      Assessment & Plan:   Problem List Items Addressed This Visit      Other   Vocal cord paresis Persistent  Completed therapy however continues to complain would like further evaluation. Previous ENT referral placed no visit established.  New referral     Other Visit Diagnoses    Hypertension, unspecified type    -  Primary Stable per patient history of Stroke form uncontrolled HTN Encouraged on going compliance with current medication regimen Encouraged home monitoring and recording BP <130/80 Eating a heart-healthy diet with less salt Encouraged regular physical activity  Recommend Weight loss   Amputation of toe of right foot (HCC)     Persistent swelling to right foot  Requesting orthotic for toes. Referral placed   Seasonal allergic rhinitis due to pollen   Persistent OTC antihistamine recommended     Neuropathy     Persistent       No orders of the defined types were placed in this encounter.   Follow-up: Return in about 6 months (around 10/17/2020).    Vevelyn Francois, NP

## 2020-04-21 ENCOUNTER — Encounter: Payer: Self-pay | Admitting: Physical Medicine and Rehabilitation

## 2020-04-21 ENCOUNTER — Other Ambulatory Visit: Payer: Self-pay

## 2020-04-21 ENCOUNTER — Encounter
Payer: Medicaid Other | Attending: Physical Medicine and Rehabilitation | Admitting: Physical Medicine and Rehabilitation

## 2020-04-21 VITALS — BP 149/90 | HR 67 | Temp 97.6°F | Ht 66.0 in | Wt 194.0 lb

## 2020-04-21 DIAGNOSIS — G894 Chronic pain syndrome: Secondary | ICD-10-CM | POA: Diagnosis not present

## 2020-04-21 DIAGNOSIS — Z5181 Encounter for therapeutic drug level monitoring: Secondary | ICD-10-CM | POA: Diagnosis not present

## 2020-04-21 DIAGNOSIS — Z79899 Other long term (current) drug therapy: Secondary | ICD-10-CM | POA: Insufficient documentation

## 2020-04-21 DIAGNOSIS — R490 Dysphonia: Secondary | ICD-10-CM | POA: Insufficient documentation

## 2020-04-21 MED ORDER — DULOXETINE HCL 60 MG PO CPEP
60.0000 mg | ORAL_CAPSULE | Freq: Every day | ORAL | 11 refills | Status: DC
Start: 2020-04-21 — End: 2021-06-26

## 2020-04-21 NOTE — Patient Instructions (Signed)
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   1. Will try outpatient SLP- speech therapy- - placed order to work on vocal intensity and breath support- might need referral to ENT- will see what SLP recommends.   2. Send pt to [employer] for shoes inserts B/L. Wrote written Rx.    3. Pt still meets criteria for disability- will be happy to fill out paperwork when they send it to me. 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 Although is walking a little better.   4. Change Duloxetine to 60 mg daily- with 11 refills for nerve pain  5. Con't Tramadol as needed-   6. F/U in 3 months since doing better

## 2020-04-21 NOTE — Progress Notes (Signed)
Subjective:    Patient ID: Andrew Bruce, male    DOB: [birth year], 55 y.o.   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   BP today is 149/90- got a different medicine- has PCP now. Much better than last visit- was 196/133 at last visit.  New medicine is Lisinopril 10 mg daily.   Nerve pain MUCH better- 3/10 now- was 7-8/10 last visit before the Duloxetine.   No side effects from Duloxetine now- had a little constipation at the beginning.  Is better.   If talks too much, has some pain in chest-  Feels like voice is forced and that's what hurts, when he talks a lot.   Mentioned to someone- needs inserts for TMA/toe amputations.      Pain Inventory Average Pain 4 Pain Right Now 4 My pain is constant and aching  In the last 24 hours, has pain interfered with the following? General activity 4 Relation with others 0 Enjoyment of life 0 What TIME of day is your pain at its worst? daytime Sleep (in general) Good  Pain is worse with: standing Pain improves with: medication Relief from Meds: 2  Family History  Problem Relation Age of Onset  . Healthy Sister   . Healthy Brother    Social History   Socioeconomic History  . Marital status: Married    Spouse name: Not on file  . Number of children: Not on file  . Years of education: Not on file  . Highest education level: Not on file  Occupational History  . Not on file  Tobacco Use  . Smoking status: Former Games developer  . Smokeless tobacco: Never Used  Vaping Use  . Vaping Use: Never used  Substance and Sexual Activity  . Alcohol use: Yes    Comment: rare to occasional  . Drug use: Never  . Sexual activity: Yes  Other Topics Concern  . Not on file  Social History Narrative  . Not on file   Social Determinants of Health   Financial Resource Strain: Not on file  Food  Insecurity: Not on file  Transportation Needs: Not on file  Physical Activity: Not on file  Stress: Not on file  Social Connections: Not on file   Past Surgical History:  Procedure Laterality Date  . AMPUTATION Right 08/13/2019   Procedure: AMPUTATION RIGHT FIRST AND SECOND TOES;  Surgeon: Larina Earthly, MD;  [facility];  Service: Vascular;  Laterality: Right;  . DIRECT LARYNGOSCOPY Bilateral 04/06/2019   Procedure: MICRO DIRECT LARYNGOSCOPY WITH PROLARYN INJECTION;  Surgeon: Christia Reading, MD;  Location: [facility] OR;  Service: ENT;  Laterality: Bilateral;  . IR GASTROSTOMY TUBE MOD SED  03/10/2019  . IR GASTROSTOMY TUBE REMOVAL  04/20/2019  . WOUND DEBRIDEMENT Left 08/13/2019   Procedure: AMPUTATION  OF LEFT FIRST TOE, AND DEBRIDEMENT OF LEFT SECOND, THIRD AND FOURTH TOES;  Surgeon: Larina Earthly, MD;  [facility];  Service: Vascular;  Laterality: Left;   Past Surgical History:  Procedure Laterality Date  . AMPUTATION Right 08/13/2019   Procedure: AMPUTATION RIGHT FIRST AND SECOND TOES;  Surgeon: Larina Earthly, MD;  [facility];  Service: Vascular;  Laterality: Right;  . DIRECT LARYNGOSCOPY Bilateral 04/06/2019   Procedure: MICRO DIRECT LARYNGOSCOPY WITH PROLARYN INJECTION;  Surgeon: Christia Reading,  MD;  [facility];  Service: ENT;  Laterality: Bilateral;  . IR GASTROSTOMY TUBE MOD SED  03/10/2019  . IR GASTROSTOMY TUBE REMOVAL  04/20/2019  . WOUND DEBRIDEMENT Left 08/13/2019   Procedure: AMPUTATION  OF LEFT FIRST TOE, AND DEBRIDEMENT OF LEFT SECOND, THIRD AND FOURTH TOES;  Surgeon: Larina Earthly, MD;  [facility];  Service: Vascular;  Laterality: Left;   Past Medical History:  Diagnosis Date  . COVID-19 02/06/2019  . Diabetes mellitus without complication (HCC)   . Hypertension   . 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 Pneumonia    with Covid  . Stroke [facility])    weakness on right side, occurred while he had Covid    There were no vitals taken for this visit.  Opioid Risk Score:   Fall Risk Score:  `1  Depression screen PHQ 2/9  Depression screen [facility] 2/9 03/06/2020 06/25/2019 06/10/2019 05/13/2019  Decreased Interest 0 0 0 0  Down, Depressed, Hopeless 0 0 0 0  PHQ - 2 Score 0 0 0 0   Review of Systems  Eyes: Positive for discharge and itching.  Respiratory: Positive for cough and shortness of breath.   All other systems reviewed and are negative.      Objective:   Physical Exam  Awake, alert, appropriate, interpretor in room,  Vocal quality is forced- hoarse all the time- worse than last visit, NAD Worse when asked to speak numbers more loudly- hoarseness got worse.  No assistive device to walk with currently.  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   1. Will try outpatient SLP- speech therapy- - placed order to work on vocal intensity and breath support- might need referral to ENT- will see what SLP recommends.   2. Send pt to [employer] for shoes inserts B/L. Wrote written Rx.    3. Pt still meets criteria for disability- will be happy to fill out paperwork when they send it to me. 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 Although is walking a little better. We discussed that pt needs ot have disability send me paperwork to have me fill out- I cannot contact them.    4. Change Duloxetine to 60 mg daily- with 11 refills for nerve pain  5. Con't Tramadol as needed-   6. F/U in 3 months since doing better

## 2020-04-24 ENCOUNTER — Other Ambulatory Visit: Payer: Self-pay

## 2020-04-24 ENCOUNTER — Ambulatory Visit: Payer: Medicaid Other | Admitting: Podiatry

## 2020-04-24 DIAGNOSIS — Z89411 Acquired absence of right great toe: Secondary | ICD-10-CM | POA: Diagnosis not present

## 2020-04-24 DIAGNOSIS — I739 Peripheral vascular disease, unspecified: Secondary | ICD-10-CM

## 2020-04-24 DIAGNOSIS — Z89412 Acquired absence of left great toe: Secondary | ICD-10-CM | POA: Diagnosis not present

## 2020-04-24 DIAGNOSIS — E0843 Diabetes mellitus due to underlying condition with diabetic autonomic (poly)neuropathy: Secondary | ICD-10-CM

## 2020-04-24 NOTE — Progress Notes (Signed)
   HPI: 55 y.o. male PMHx DM type II presenting for follow-up evaluation of history of bilateral toe amputations.  Patient is a diabetic and is inquiring today about custom molded diabetic shoes with a toe filler to help prevent slipping inside of his shoes.  He presents for further treatment and evaluation  Past Medical History:  Diagnosis Date  . COVID-19 02/06/2019  . Diabetes mellitus without complication (HCC)   . Hypertension   . 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 Pneumonia    with Covid  . Stroke [facility])    weakness on right side, occurred while he had Covid     Physical Exam: General: The patient is alert and oriented x3 in no acute distress.  Dermatology: Skin is warm, dry and supple bilateral lower extremities. Negative for open lesions or macerations.  Vascular: Palpable pedal pulses bilaterally. No edema or erythema noted.   Neurological: Epicritic and protective threshold grossly intact bilaterally.   Musculoskeletal Exam: Amputation of the bilateral great toes as well as the right second toe  Assessment: 1.  Diabetes mellitus type 2 2.  History of bilateral toe amputations   Plan of Care:  1. Patient evaluated. 2.  Comprehensive diabetic foot exam was performed today. 3.  Recommend that the patient wear good supportive diabetic shoes.  The patient states that he has diabetic shoes at home. 4.  The patient would benefit from custom diabetic insoles with toe fillers to alleviate slipping and help support his foot better. 5.  Appointment with Pedorthist for custom molded diabetic insoles and toe fillers 6.  Return to clinic as needed     Felecia Shelling, DPM [facility]  Dr. Felecia Shelling, DPM    [address] [place], [place] [address]                Office [phone]  Fax [phone]

## 2020-04-26 ENCOUNTER — Encounter: Payer: Medicaid Other | Admitting: Internal Medicine

## 2020-04-27 LAB — TOXASSURE SELECT,+ANTIDEPR,UR

## 2020-05-03 ENCOUNTER — Telehealth: Payer: Self-pay | Admitting: *Deleted

## 2020-05-03 NOTE — Telephone Encounter (Signed)
Urine drug screen for this encounter is consistent for prescribed medication 

## 2020-05-22 ENCOUNTER — Telehealth: Payer: Self-pay | Admitting: Podiatry

## 2020-05-22 NOTE — Telephone Encounter (Signed)
Called pt because he was scheduled to see EJ but has [employer] and they do not cover it thru our office. Pt needs a rx to go to  for toe filler insert and diabetic shoes. Possibly to go to [facility] in [place]. I told pt I would mail rx to him.

## 2020-05-26 ENCOUNTER — Other Ambulatory Visit: Payer: Medicaid Other

## 2020-06-06 ENCOUNTER — Telehealth: Payer: Self-pay

## 2020-06-06 NOTE — Telephone Encounter (Signed)
Pt asking for Physical therapy (feet pain)  Patient speak spanish and will need a call.  Also check for his form

## 2020-06-07 ENCOUNTER — Other Ambulatory Visit: Payer: Self-pay | Admitting: Nurse Practitioner

## 2020-06-07 DIAGNOSIS — M79672 Pain in left foot: Secondary | ICD-10-CM

## 2020-06-07 NOTE — Progress Notes (Signed)
   [facility] [address] [place], [place]  [address] Phone:  [phone]   Fax:  [phone] 

## 2020-06-07 NOTE — Telephone Encounter (Signed)
Order in.

## 2020-06-19 ENCOUNTER — Ambulatory Visit (INDEPENDENT_AMBULATORY_CARE_PROVIDER_SITE_OTHER): Payer: Medicaid Other

## 2020-06-19 ENCOUNTER — Ambulatory Visit
Admission: EM | Admit: 2020-06-19 | Discharge: 2020-06-19 | Disposition: A | Discharge status: Home / Self Care | Payer: Medicaid Other | Attending: Emergency Medicine | Admitting: Emergency Medicine

## 2020-06-19 ENCOUNTER — Other Ambulatory Visit: Payer: Self-pay

## 2020-06-19 DIAGNOSIS — R059 Cough, unspecified: Secondary | ICD-10-CM

## 2020-06-19 DIAGNOSIS — H04121 Dry eye syndrome of right lacrimal gland: Secondary | ICD-10-CM | POA: Diagnosis not present

## 2020-06-19 DIAGNOSIS — J22 Unspecified acute lower respiratory infection: Secondary | ICD-10-CM | POA: Diagnosis not present

## 2020-06-19 MED ORDER — BENZONATATE 200 MG PO CAPS: 200.0000 mg | ORAL_CAPSULE | Freq: Three times a day (TID) | ORAL | 0 refills | Status: AC | PRN

## 2020-06-19 MED ORDER — DM-GUAIFENESIN ER 30-600 MG PO TB12: 1.0000 | ORAL_TABLET | Freq: Two times a day (BID) | ORAL | 0 refills | Status: DC

## 2020-06-19 MED ORDER — CARBOXYMETHYLCELLULOSE SODIUM 1 % OP SOLN: 1.0000 [drp] | Freq: Three times a day (TID) | OPHTHALMIC | 0 refills | Status: DC

## 2020-06-19 MED ORDER — DOXYCYCLINE HYCLATE 100 MG PO CAPS
100.0000 mg | ORAL_CAPSULE | Freq: Two times a day (BID) | ORAL | 0 refills | Status: AC
Start: 2020-06-19 — End: 2020-06-26

## 2020-06-19 NOTE — ED Triage Notes (Addendum)
Pt presents today with a 2 week h/o productive cough, congestion and sore throat. Notes some pain when swallowing and sob with extensive movement. No meds taken. No known exposures to illnesses. Denies n/v/d.  Pt also has right eye concerns. He is requesting a referral to ophthalmology.

## 2020-06-19 NOTE — Discharge Instructions (Signed)
No pneumonia en su radiografia Empiece doxycyline 2 veces cada dia x 1 semana Tessalon/benzonatate para tos si necesita cada 8 horas [place] mucinex DM para ayuda con tos y congestion Refresh/carboxymethylcellulose gotas de ojos 3 veces cada dia para ayuda con seca Regrese si no mejoran

## 2020-06-19 NOTE — ED Provider Notes (Signed)
[name]    CSN: [id number] Arrival date & time: 06/19/20  1032      History   Chief Complaint Chief Complaint  Patient presents with  . Cough  . Nasal Congestion    HPI Andrew Bruce is a 55 y.o. male history of DM type II, hypertension, prior PE, presenting today for evaluation of cough and fatigue.  Patient reports over the past 2 weeks he has had cough congestion sore throat, fatigue and eye dryness.  Denies any fevers.  Has had some shortness of breath with exertion as well as reports some chest discomfort in the morning.  He had acute respiratory failure secondary to COVID causing multiple long-term complications including stroke causing one-sided weakness, in January 2021.  He feels he has not fully recovered since then.  HPI  Past Medical History:  Diagnosis Date  . COVID-19 02/06/2019  . Diabetes mellitus without complication ([place])   . Hypertension   . PE (pulmonary thromboembolism) ([place])    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  . Pneumonia    with Covid  . Stroke [facility])    weakness on right side, occurred while he had Covid    Patient Active Problem List   Diagnosis Date Noted  . Uncontrolled hypertension 02/18/2020  . Impaired functional mobility, balance, gait, and endurance 08/23/2019  . Amputation of great toe ([place]) 08/23/2019  . 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 Impaired ambulation 06/11/2019  . Dependent on walker for ambulation 04/30/2019  . Right hemiparesis ([place]) 04/30/2019  . Tachycardia 04/30/2019  . 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 Acute blood loss anemia   . Uncontrolled type 2 diabetes mellitus with hyperglycemia ([place])   . Dry gangrene ([place])   . Leukocytosis   . Embolic stroke ([place]) 25/85/2778  . Status post tracheostomy ([place])   . Pressure injury of skin 03/02/2019  . On mechanically assisted ventilation ([place])   . Goals of care, counseling/discussion   .  Palliative care encounter   . ARDS (adult respiratory distress syndrome) ([place])   . Cerebral embolism with cerebral infarction 02/17/2019  . Acute respiratory failure with hypoxia ([place])   . Pneumothorax, right     Past Surgical History:  Procedure Laterality Date  . AMPUTATION Right 08/13/2019   Procedure: AMPUTATION RIGHT FIRST AND SECOND TOES;  Surgeon: Rosetta Posner, MD;  Location: [place];  Service: Vascular;  Laterality: Right;  . DIRECT LARYNGOSCOPY Bilateral 04/06/2019   Procedure: MICRO DIRECT LARYNGOSCOPY WITH PROLARYN INJECTION;  Surgeon: Melida Quitter, MD;  Location: [place];  Service: ENT;  Laterality: Bilateral;  . IR GASTROSTOMY TUBE MOD SED  03/10/2019  . IR GASTROSTOMY TUBE REMOVAL  04/20/2019  . WOUND DEBRIDEMENT Left 08/13/2019   Procedure: AMPUTATION  OF LEFT FIRST TOE, AND DEBRIDEMENT OF LEFT SECOND, THIRD AND FOURTH TOES;  Surgeon: Rosetta Posner, MD;  Location: [place] [facility];  Service: Vascular;  Laterality: Left;       Home Medications    Prior to Admission medications   Medication Sig Start Date End Date Taking? Authorizing Provider  benzonatate (TESSALON) 200 MG capsule Take 1 capsule (200 mg total) by mouth 3 (three) times daily as needed for up to 7 days for cough. 06/19/20 06/26/20 Yes Chaka Jefferys C, PA-C  carboxymethylcellulose 1 % ophthalmic solution Apply 1 drop to eye 3 (three) times daily. 06/19/20  Yes Tamecka Milham C, PA-C  dextromethorphan-guaiFENesin (MUCINEX DM) 30-600 MG  12hr tablet Take 1 tablet by mouth 2 (two) times daily. 06/19/20  Yes Davaughn Hillyard C, PA-C  doxycycline (VIBRAMYCIN) 100 MG capsule Take 1 capsule (100 mg total) by mouth 2 (two) times daily for 7 days. 06/19/20 06/26/20 Yes Robbyn Hodkinson C, PA-C  blood glucose meter kit and supplies KIT Dispense based on patient and insurance preference. Use up to four times daily as directed. (FOR ICD-9 250.00, 250.01). 04/20/19   Love, Ivan Anchors, PA-C  DULoxetine (CYMBALTA) 60 MG capsule Take 1 capsule (60 mg total)  by mouth daily. For nerve pain 04/21/20   [name], Jinny Blossom, MD  glipiZIDE (GLUCOTROL) 5 MG tablet Take 0.5 tablets (2.5 mg total) by mouth daily before breakfast. 11/19/19   [name], Jinny Blossom, MD  lisinopril (ZESTRIL) 10 MG tablet Take 1 tablet (10 mg total) by mouth daily. 03/06/20   Vevelyn Francois, NP  metFORMIN (GLUCOPHAGE) 500 MG tablet Take 1 tablet (500 mg total) by mouth 2 (two) times daily with a meal. 11/19/19   [name], Jinny Blossom, MD  metoprolol tartrate (LOPRESSOR) 25 MG tablet Take 1 tablet (25 mg total) by mouth 2 (two) times daily. For blood pressure 02/18/20   [name], Jinny Blossom, MD  traMADol (ULTRAM) 50 MG tablet Take 2 tablets (100 mg total) by mouth 3 (three) times daily as needed for moderate pain or severe pain. 02/18/20   Courtney Heys, MD    Family History Family History  Problem Relation Age of Onset  . Healthy Sister   . Healthy Brother     Social History Social History   Tobacco Use  . Smoking status: Former Research scientist (life sciences)  . Smokeless tobacco: Never Used  Vaping Use  . Vaping Use: Never used  Substance Use Topics  . Alcohol use: Yes    Comment: rare to occasional  . Drug use: Never     Allergies   Patient has no known allergies.   Review of Systems Review of Systems  Constitutional: Positive for appetite change and fatigue. Negative for activity change, chills and fever.  HENT: Positive for congestion and sore throat. Negative for ear pain, rhinorrhea, sinus pressure and trouble swallowing.   Eyes: Negative for discharge and redness.  Respiratory: Positive for cough. Negative for chest tightness and shortness of breath.   Cardiovascular: Negative for chest pain.  Gastrointestinal: Negative for abdominal pain, diarrhea, nausea and vomiting.  Musculoskeletal: Negative for myalgias.  Skin: Negative for rash.  Neurological: Negative for dizziness, light-headedness and headaches.     Physical Exam Triage Vital Signs ED Triage Vitals  Enc Vitals Group     BP      Pulse       Resp      Temp      Temp src      SpO2      Weight      Height      Head Circumference      Peak Flow      Pain Score      Pain Loc      Pain Edu?      Excl. in [place]?    No data found.  Updated Vital Signs BP (!) 174/96 (BP Location: Left Arm)   Pulse 68   Temp 97.9 F (36.6 C) (Oral)   Resp 18   SpO2 96%   Visual Acuity Right Eye Distance:   Left Eye Distance:   Bilateral Distance:    Right Eye Near:   Left Eye Near:    Bilateral Near:  Physical Exam Vitals and nursing note reviewed.  Constitutional:      Appearance: He is well-developed.     Comments: No acute distress  HENT:     Head: Normocephalic and atraumatic.     Ears:     Comments: Bilateral ears without tenderness to palpation of external auricle, tragus and mastoid, EAC's without erythema or swelling, TM's with good bony landmarks and cone of light. Non erythematous.     Nose: Nose normal.     Mouth/Throat:     Comments: Oral mucosa pink and moist, no tonsillar enlargement or exudate. Posterior pharynx patent and nonerythematous, no uvula deviation or swelling. Normal phonation. Eyes:     Extraocular Movements: Extraocular movements intact.     Conjunctiva/sclera: Conjunctivae normal.     Pupils: Pupils are equal, round, and reactive to light.     Comments: Bilateral eyes without any conjunctival erythema, anterior chamber clear, no photophobia with exam, no discharge noted, no periorbital erythema or swelling  Cardiovascular:     Rate and Rhythm: Normal rate.  Pulmonary:     Effort: Pulmonary effort is normal. No respiratory distress.     Comments: Breathing comfortably at rest, faint crackles noted to bilateral bases Abdominal:     General: There is no distension.  Musculoskeletal:        General: Normal range of motion.     Cervical back: Neck supple.  Skin:    General: Skin is warm and dry.  Neurological:     Mental Status: He is alert and oriented to person, place, and time.      UC  Treatments / Results  Labs (all labs ordered are listed, but only abnormal results are displayed) Labs Reviewed - No data to display  EKG   Radiology DG Chest 2 View  Result Date: 06/19/2020 CLINICAL DATA:  Cough for 2 weeks EXAM: CHEST - 2 VIEW COMPARISON:  03/24/2019 FINDINGS: Mild cardiac enlargement, stable. Bilateral, peripheral and lower lung zone predominant reticular interstitial opacities with architectural distortion noted. Findings are favored to reflect post COVID fibrotic changes. The lung volumes appear low. No pleural effusion or edema. Chronic pleural thickening identified overlying the lateral right lung. No superimposed airspace consolidation. 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 No definite evidence for acute superimposed infection. Electronically Signed   By: Kerby Moors M.D.   On: 06/19/2020 11:36    Procedures Procedures (including critical care time)  Medications Ordered in UC Medications - No data to display  Initial Impression / Assessment and Plan / UC Course  I have reviewed the triage vital signs and the nursing notes.  Pertinent labs & imaging results that were available during my care of the patient were reviewed by me and considered in my medical decision making (see chart for details).     X-ray consistent with chronic changes from COVID, no new pneumonia noted.  Given history and symptoms x2 weeks opting to still proceed with antibiotic coverage with doxycycline, Tessalon and Mucinex for cough and congestion, refresh eyedrops for eye dryness, no sign of eye infection at this time, recommended following up with optometry/ophthalmology for further evaluation of eye problems if persistent.  Discussed strict return precautions. Patient verbalized understanding and is agreeable with plan.  Final Clinical Impressions(s) / UC Diagnoses   Final diagnoses:  Cough  Dry eye of right side  Lower  respiratory infection (e.g., bronchitis, pneumonia, pneumonitis, pulmonitis)     Discharge Instructions     No  pneumonia en su radiografia Empiece doxycyline 2 veces cada dia x 1 semana Tessalon/benzonatate para tos si necesita cada 8 horas [place] mucinex DM para ayuda con tos y congestion Refresh/carboxymethylcellulose gotas de ojos 3 veces cada dia para ayuda con seca Regrese si no mejoran    ED Prescriptions    Medication Sig Dispense Auth. Provider   doxycycline (VIBRAMYCIN) 100 MG capsule Take 1 capsule (100 mg total) by mouth 2 (two) times daily for 7 days. 14 capsule Maddy Graham C, PA-C   benzonatate (TESSALON) 200 MG capsule Take 1 capsule (200 mg total) by mouth 3 (three) times daily as needed for up to 7 days for cough. 28 capsule Annelise Mccoy C, PA-C   carboxymethylcellulose 1 % ophthalmic solution Apply 1 drop to eye 3 (three) times daily. 30 mL Orland Visconti C, PA-C   dextromethorphan-guaiFENesin (MUCINEX DM) 30-600 MG 12hr tablet Take 1 tablet by mouth 2 (two) times daily. 30 tablet Tationa Stech, [place] C, PA-C     PDMP not reviewed this encounter.   Joneen Caraway [place] C, PA-C 06/19/20 1206

## 2020-06-21 ENCOUNTER — Other Ambulatory Visit: Payer: Self-pay

## 2020-06-21 ENCOUNTER — Ambulatory Visit: Payer: Medicaid Other | Attending: Nurse Practitioner

## 2020-06-21 DIAGNOSIS — M6281 Muscle weakness (generalized): Secondary | ICD-10-CM | POA: Diagnosis present

## 2020-06-21 DIAGNOSIS — M79672 Pain in left foot: Secondary | ICD-10-CM | POA: Diagnosis present

## 2020-06-21 DIAGNOSIS — R2689 Other abnormalities of gait and mobility: Secondary | ICD-10-CM | POA: Insufficient documentation

## 2020-06-21 DIAGNOSIS — M79671 Pain in right foot: Secondary | ICD-10-CM | POA: Diagnosis present

## 2020-06-21 DIAGNOSIS — R2681 Unsteadiness on feet: Secondary | ICD-10-CM | POA: Insufficient documentation

## 2020-06-22 NOTE — Therapy (Addendum)
[facility] [address] [place], [place], [address] Phone: [phone]   Fax:  [phone]  Physical Therapy Evaluation  Patient Details  Name: Andrew Bruce MRN: [id number] Date of Birth: December 07, 1965 Referring Provider (PT): Barbette Merino, NP   Encounter Date: 06/21/2020   PT End of Session - 06/21/20 1526     Visit Number 1    Number of Visits 13    Date for PT Re-Evaluation 08/03/20    Authorization Type [facility] [place] - eval on 06/21/20    PT Start Time 1527    PT Stop Time 1612    PT Time Calculation (min) 45 min    Activity Tolerance Patient tolerated treatment well    Behavior During Therapy [facility] for tasks assessed/performed             Past Medical History:  Diagnosis Date   COVID-19 02/06/2019   Diabetes mellitus without complication (HCC)    Hypertension    PE (pulmonary thromboembolism) (HCC)    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   Pneumonia    with Covid   Stroke (HCC)    weakness on right side, occurred while he had Covid    Past Surgical History:  Procedure Laterality Date   AMPUTATION Right 08/13/2019   Procedure: AMPUTATION RIGHT FIRST AND SECOND TOES;  Surgeon: Larina Earthly, MD;  [facility];  Service: Vascular;  Laterality: Right;   DIRECT LARYNGOSCOPY Bilateral 04/06/2019   Procedure: MICRO DIRECT LARYNGOSCOPY WITH PROLARYN INJECTION;  Surgeon: Christia Reading, MD;  Location: [place] [facility];  Service: ENT;  Laterality: Bilateral;   IR GASTROSTOMY TUBE MOD SED  03/10/2019   IR GASTROSTOMY TUBE REMOVAL  04/20/2019   WOUND DEBRIDEMENT Left 08/13/2019   Procedure: AMPUTATION  OF LEFT FIRST TOE, AND DEBRIDEMENT OF LEFT SECOND, THIRD AND FOURTH TOES;  Surgeon: Larina Earthly, MD;  Location: [facility];  Service: Vascular;  Laterality: Left;    There were no vitals filed for this visit.    Subjective Assessment - 06/21/20 1535     Subjective Pt is a pleasant 55 y/o M who presesnts to PT with  reports of chronic bilateral foot pain. He notes that "covid really affected me" and he has had numerous health issues including CVA and gangreene with toe amputations as complications. Pt was seen last year in neuro PT s/p CVA and has been ambulating using SPC with no issues. Pt notes that foot pain is worst first thing in the morning and when he ambulates for prolonged periods.    Patient is accompained by: Interpreter   [employer] video interpreter   Pertinent History COVID in patient stay, hx of CVA, hx of PE, gangerene of toes    Limitations Walking;Standing;Lifting;House hold activities    How long can you stand comfortably? 30 minutes    How long can you walk comfortably? 15 min    Patient Stated Goals decrease foot pain and be able to do things that he was able to do before covid    Currently in Pain? Yes    Pain Score 7    10/10 at worst   Pain Location Foot    Pain Orientation Right;Left    Pain Type Chronic pain    Pain Onset More than a month ago    Pain Frequency Constant    Aggravating Factors  walking, first step in the morning    Pain Relieving Factors movement of toes and joints  in foot                [facility] PT Assessment - 06/22/20 0001       Assessment   Medical Diagnosis M79.671,M79.672 (ICD-10-CM) - Foot pain, bilateral    Referring Provider (PT) Barbette Merino, NP    Hand Dominance Right      Precautions   Precautions Fall      Restrictions   Weight Bearing Restrictions No      Balance Screen   Has the patient fallen in the past 6 months No    Has the patient had a decrease in activity level because of a fear of falling?  No    Is the patient reluctant to leave their home because of a fear of falling?  No      Home Environment   Living Environment Private residence    Living Arrangements Spouse/significant other;Children    Type of Home House    Home Access Stairs to enter    Entrance Stairs-Number of Steps 2    Entrance Stairs-Rails Left;Right;Can  reach both    Home Layout Two level    Alternate Level Stairs-Number of Steps 14    Alternate Level Stairs-Rails Right      Prior Function   Level of Independence Independent      Cognition   Overall Cognitive Status Within Functional Limits for tasks assessed    Attention Focused      Observation/Other Assessments   Observations bilateral toe amputations noted    Focus on Therapeutic Outcomes (FOTO)  No FOTO MCD      AROM   Right Ankle Dorsiflexion 8    Left Ankle Dorsiflexion 5      Strength   Right Ankle Dorsiflexion 4/5    Right Ankle Plantar Flexion 4/5    Right Ankle Inversion 4/5    Right Ankle Eversion 4/5    Left Ankle Dorsiflexion 4/5    Left Ankle Plantar Flexion 4/5    Left Ankle Inversion 4/5    Left Ankle Eversion 4/5      Palpation   Palpation comment TTP to bilateral plantar fasica, trigger points noted                        Objective measurements completed on examination: See above findings.       [facility] Adult PT Treatment/Exercise - 06/22/20 0001       Exercises   Exercises Ankle      Ankle Exercises: Stretches   Gastroc Stretch Limitations x 30 sec ea with towel      Ankle Exercises: Seated   Other Seated Ankle Exercises seated tennis ball massage to ea plantar fasica      Ankle Exercises: Supine   T-Band ankle 4-way YTB x 10 ea                    PT Education - 06/22/20 1012     Education Details eval findings, HEP, POC    Person(s) Educated Patient    Methods Explanation;Demonstration;Handout    Comprehension Returned demonstration;Verbalized understanding              PT Short Term Goals - 06/22/20 1018       PT SHORT TERM GOAL #1   Title Pt will be compliant and knowledgeable with 90% of HEP in order to improve carryover    Baseline initial HEP given    Time 3  Period Weeks    Status New    Target Date 07/13/20      PT SHORT TERM GOAL #2   Title Pt will self report bilateral foot pain no  greater than 5/10 at worst in order to improve comfort and function    Baseline 10/10 at worst    Time 3    Period Weeks    Status New    Target Date 07/13/20               PT Long Term Goals - 06/22/20 1019       PT LONG TERM GOAL #1   Title Pt will self report bilateral foot pain no greater than 3/10 at worst in order to improve comfort and function    Baseline 10/10 at worst    Time 6    Period Weeks    Status New    Target Date 08/03/20      PT LONG TERM GOAL #2   Title Pt will improve bilateral DF AROM to no less than 12 degrees for improved mobility and decreased pain    Baseline see flowsheet    Time 6    Period Weeks    Status New    Target Date 08/03/20      PT LONG TERM GOAL #3   Title Pt will be compliant and knowledgeable with advanced HEP for continued improvement post discharge    Time 6    Period Weeks    Status New    Target Date 08/03/20                    Plan - 06/22/20 1023     Clinical Impression Statement Pt is a pleasant 55 y/o M who presents to PT with reports of bilateral foot pain. His s/s are consistent with provider referral, with palpable tenderness to bilateral arches and calves. Pt also demos decreased DF and due to other subjective s/s, unable to rule out possibility of contribution from plantar fascitis. Pt would benefit from skilled PT services working on improve strength of foot intrinsics and bilateral distal LE musculature to improve function and decrease pain. Will assess response to initial HEP and progress as able.    Personal Factors and Comorbidities Comorbidity 2;Time since onset of injury/illness/exacerbation;Transportation    Comorbidities Hx of CVA, Gangerene of toes    Examination-Activity Limitations Hygiene/Grooming;Geophysical data processor for Others;Squat;Stand;Lift    Camera operator;Yard Work;Driving;Community Activity    Stability/Clinical Decision Making  Stable/Uncomplicated    Clinical Decision Making Low    Rehab Potential Good    PT Frequency 2x / week    PT Duration 6 weeks    PT Treatment/Interventions ADLs/Self Care Home Management;Gait training;Stair training;Functional mobility training;Therapeutic exercise;Therapeutic activities;Balance training;Patient/family education;Passive range of motion;Manual techniques;Energy conservation;Visual/perceptual remediation/compensation;Joint Manipulations;Neuromuscular re-education    PT Next Visit Plan assess response to HEP; progress as able    PT Home Exercise Plan [email or web address]    Consulted and Agree with Plan of Care Patient             Patient will benefit from skilled therapeutic intervention in order to improve the following deficits and impairments:  Abnormal gait, Decreased balance, Decreased mobility, Decreased endurance, Decreased range of motion, Decreased strength, Difficulty walking, Impaired sensation, Pain, Decreased activity tolerance  Visit Diagnosis: Muscle weakness (generalized)  Pain in left foot  Pain in right foot  Unsteadiness on feet     Problem List Patient Active Problem List   Diagnosis  Date Noted   Uncontrolled hypertension 02/18/2020   Impaired functional mobility, balance, gait, and endurance 08/23/2019   Amputation of great toe (HCC) 08/23/2019   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   Impaired ambulation 06/11/2019   Dependent on walker for ambulation 04/30/2019   Right hemiparesis (HCC) 04/30/2019   Tachycardia 04/30/2019   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   Acute blood loss anemia    Uncontrolled type 2 diabetes mellitus with hyperglycemia (HCC)    Dry gangrene (HCC)    Leukocytosis    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   Status post tracheostomy (HCC)    Pressure injury of skin 03/02/2019   On mechanically assisted ventilation (HCC)    Goals of care, counseling/discussion    Palliative care encounter    ARDS  (adult respiratory distress syndrome) (HCC)    Cerebral embolism with cerebral infarction 02/17/2019   Acute respiratory failure with hypoxia (HCC)    Pneumothorax, right     Eloy Endavid C Alynn Ellithorpe, PT, DPT 06/22/20 10:29 AM  [facility] [address] [place], [place], [address] Phone: [phone]   Fax:  [phone]  Name: Andrew ShamesLeonel Bruce MRN: [id number] Date of Birth: 03-27-1965  Check all possible CPT codes: 97110- Therapeutic Exercise, [phone]- Neuro Re-education, [phone] - Gait Training, [phone] - Manual Therapy, [phone] - Therapeutic Activities, and 97535 - Self Care

## 2020-06-23 ENCOUNTER — Telehealth: Payer: Self-pay

## 2020-06-23 ENCOUNTER — Other Ambulatory Visit: Payer: Self-pay

## 2020-06-23 DIAGNOSIS — L97529 Non-pressure chronic ulcer of other part of left foot with unspecified severity: Secondary | ICD-10-CM

## 2020-06-23 NOTE — Telephone Encounter (Signed)
Patient's daughter called to report her dad is having pain in bilateral feet. He started c/o this a few days ago. She says he doesn't have any wounds, but is unsure if feet are cold. Says the pain occurs sometimes when he is not doing anything. His legs are not swollen. Placed patient on schedule for ABIs and follow up.

## 2020-06-23 NOTE — Progress Notes (Signed)
error 

## 2020-06-28 ENCOUNTER — Ambulatory Visit: Payer: Medicaid Other | Admitting: Physical Therapy

## 2020-06-28 ENCOUNTER — Encounter: Payer: Self-pay | Admitting: Physical Therapy

## 2020-06-28 ENCOUNTER — Other Ambulatory Visit: Payer: Self-pay

## 2020-06-28 DIAGNOSIS — M6281 Muscle weakness (generalized): Secondary | ICD-10-CM | POA: Diagnosis not present

## 2020-06-28 DIAGNOSIS — R2689 Other abnormalities of gait and mobility: Secondary | ICD-10-CM

## 2020-06-28 DIAGNOSIS — M79672 Pain in left foot: Secondary | ICD-10-CM

## 2020-06-28 DIAGNOSIS — R2681 Unsteadiness on feet: Secondary | ICD-10-CM

## 2020-06-28 DIAGNOSIS — M79671 Pain in right foot: Secondary | ICD-10-CM

## 2020-06-28 NOTE — Therapy (Signed)
[facility] [address] [place], [place], [address] Phone: [phone]   Fax:  [phone]  Physical Therapy Treatment  Patient Details  Name: Andrew Bruce MRN: [id number] Date of Birth: 1965-10-20 Referring Provider (PT): Barbette Merino, NP   Encounter Date: 06/28/2020   PT End of Session - 06/28/20 1702     Visit Number 2    Number of Visits 13    Date for PT Re-Evaluation 08/03/20    Authorization Type M[employer] [place] - eval on 06/21/20    PT Start Time 0500    PT Stop Time 0543    PT Time Calculation (min) 43 min    Activity Tolerance Patient tolerated treatment well    Behavior During Therapy [facility] for tasks assessed/performed             Past Medical History:  Diagnosis Date   COVID-19 02/06/2019   Diabetes mellitus without complication (HCC)    Hypertension    PE (pulmonary thromboembolism) (HCC)    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   Pneumonia    with Covid   Stroke (HCC)    weakness on right side, occurred while he had Covid    Past Surgical History:  Procedure Laterality Date   AMPUTATION Right 08/13/2019   Procedure: AMPUTATION RIGHT FIRST AND SECOND TOES;  Surgeon: Larina Earthly, MD;  [facility] OR;  Service: Vascular;  Laterality: Right;   DIRECT LARYNGOSCOPY Bilateral 04/06/2019   Procedure: MICRO DIRECT LARYNGOSCOPY WITH PROLARYN INJECTION;  Surgeon: Christia Reading, MD;  [facility] OR;  Service: ENT;  Laterality: Bilateral;   IR GASTROSTOMY TUBE MOD SED  03/10/2019   IR GASTROSTOMY TUBE REMOVAL  04/20/2019   WOUND DEBRIDEMENT Left 08/13/2019   Procedure: AMPUTATION  OF LEFT FIRST TOE, AND DEBRIDEMENT OF LEFT SECOND, THIRD AND FOURTH TOES;  Surgeon: Larina Earthly, MD;  Location: [facility] OR;  Service: Vascular;  Laterality: Left;    There were no vitals filed for this visit.   Subjective Assessment - 06/28/20 1703     Subjective Pt reports that his feet are feeling somewhat better.  He  has been able to complete the exercises.    Patient is accompained by: Interpreter   [employer] video interpreter   Pertinent History COVID in patient stay, hx of CVA, hx of PE, gangerene of toes    Limitations Walking;Standing;Lifting;House hold activities    How long can you stand comfortably? 30 minutes    How long can you walk comfortably? 15 min    Patient Stated Goals decrease foot pain and be able to do things that he was able to do before covid    Pain Score 6     Pain Location Foot    Pain Orientation Right;Left    Pain Onset More than a month ago                               [facility] Adult PT Treatment/Exercise - 06/28/20 0001       Exercises   Exercises Ankle;Knee/Hip      Knee/Hip Exercises: Aerobic   Nustep 5 min L4 S9      Knee/Hip Exercises: Standing   Heel Raises Limitations 3x10      Manual Therapy   Manual therapy comments IASTM bilateral PF and gastroc      Ankle Exercises: Stretches   Gastroc Stretch Limitations 2 x  30 sec ea with towel      Ankle Exercises: Supine   T-Band ankle 4-way GTB x 10 ea                      PT Short Term Goals - 06/22/20 1018       PT SHORT TERM GOAL #1   Title Pt will be compliant and knowledgeable with 90% of HEP in order to improve carryover    Baseline initial HEP given    Time 3    Period Weeks    Status New    Target Date 07/13/20      PT SHORT TERM GOAL #2   Title Pt will self report bilateral foot pain no greater than 5/10 at worst in order to improve comfort and function    Baseline 10/10 at worst    Time 3    Period Weeks    Status New    Target Date 07/13/20               PT Long Term Goals - 06/22/20 1019       PT LONG TERM GOAL #1   Title Pt will self report bilateral foot pain no greater than 3/10 at worst in order to improve comfort and function    Baseline 10/10 at worst    Time 6    Period Weeks    Status New    Target Date 08/03/20      PT LONG TERM GOAL  #2   Title Pt will improve bilateral DF AROM to no less than 12 degrees for improved mobility and decreased pain    Baseline see flowsheet    Time 6    Period Weeks    Status New    Target Date 08/03/20      PT LONG TERM GOAL #3   Title Pt will be compliant and knowledgeable with advanced HEP for continued improvement post discharge    Time 6    Period Weeks    Status New    Target Date 08/03/20                   Plan - 06/28/20 1730     Clinical Impression Statement Pt seems to be progressing well with therapy.  We discussed the importance of stretching and therex at home; he confirms understanding.  Issued green TB to profress HEP.  Pt reports reduced pain after MT.    Personal Factors and Comorbidities Comorbidity 2;Time since onset of injury/illness/exacerbation;Transportation    Comorbidities Hx of CVA, Gangerene of toes    Examination-Activity Limitations Hygiene/Grooming;Geophysical data processor for Others;Squat;Stand;Lift    Camera operator;Yard Work;Driving;Community Activity    Stability/Clinical Decision Making Stable/Uncomplicated    Rehab Potential Good    PT Frequency 2x / week    PT Duration 6 weeks    PT Treatment/Interventions ADLs/Self Care Home Management;Gait training;Stair training;Functional mobility training;Therapeutic exercise;Therapeutic activities;Balance training;Patient/family education;Passive range of motion;Manual techniques;Energy conservation;Visual/perceptual remediation/compensation;Joint Manipulations;Neuromuscular re-education    PT Next Visit Plan assess response to HEP; progress as able    PT Home Exercise Plan [email or web address]    Consulted and Agree with Plan of Care Patient             Patient will benefit from skilled therapeutic intervention in order to improve the following deficits and impairments:  Abnormal gait, Decreased balance, Decreased mobility, Decreased endurance, Decreased range of motion,  Decreased strength, Difficulty walking, Impaired sensation, Pain, Decreased activity  tolerance  Visit Diagnosis: Muscle weakness (generalized)  Pain in left foot  Pain in right foot  Unsteadiness on feet  Other abnormalities of gait and mobility     Problem List Patient Active Problem List   Diagnosis Date Noted   Uncontrolled hypertension 02/18/2020   Impaired functional mobility, balance, gait, and endurance 08/23/2019   Amputation of great toe (HCC) 08/23/2019   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   Impaired ambulation 06/11/2019   Dependent on walker for ambulation 04/30/2019   Right hemiparesis (HCC) 04/30/2019   Tachycardia 04/30/2019   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 [address] [place], [place], [address] Phone: [phone]   Fax:  [phone]  Name: Andrew Bruce MRN: [id number] Date of Birth: [birth year]

## 2020-06-29 ENCOUNTER — Telehealth: Payer: Self-pay | Admitting: Podiatry

## 2020-06-29 ENCOUNTER — Telehealth: Payer: Self-pay

## 2020-06-29 NOTE — Telephone Encounter (Signed)
Received a voicemail from [employer] with [facility] stating pt stated he was denied diabetic shoes and prosthetic insert and she would like a call back. HEr # is [phone].  I returned call and explained to Mrs Natale Milch he was not denied but we are not able to bill medicaid for dme thru our office and pt was sent a rx to go to [facility] in [place]. I alos told her there was a new place that we just found out about called [facility] that he could try as well. She said she understood.

## 2020-06-29 NOTE — Telephone Encounter (Signed)
[name] Caseworker [facility] [phone]  Needs to know about his hemoglobin  Also pt is asking about a prosthetic shoes info.  Please call her.

## 2020-06-29 NOTE — Telephone Encounter (Signed)
Questioning about diabetic shoes, advised that the form patient dropped off was faxed to his podiatrist office [facility]. Provided that phone number. Confirmed last A1C and next appointment.

## 2020-07-04 ENCOUNTER — Encounter: Payer: Self-pay | Admitting: Physical Therapy

## 2020-07-04 ENCOUNTER — Other Ambulatory Visit: Payer: Self-pay

## 2020-07-04 ENCOUNTER — Ambulatory Visit: Payer: Medicaid Other | Admitting: Physical Therapy

## 2020-07-04 DIAGNOSIS — M6281 Muscle weakness (generalized): Secondary | ICD-10-CM | POA: Diagnosis not present

## 2020-07-04 DIAGNOSIS — M79672 Pain in left foot: Secondary | ICD-10-CM

## 2020-07-04 DIAGNOSIS — R2681 Unsteadiness on feet: Secondary | ICD-10-CM

## 2020-07-04 DIAGNOSIS — M79671 Pain in right foot: Secondary | ICD-10-CM

## 2020-07-04 DIAGNOSIS — R2689 Other abnormalities of gait and mobility: Secondary | ICD-10-CM

## 2020-07-04 NOTE — Patient Instructions (Signed)
Access Code: [email or web address] URL: [email or web address] Date: 07/04/2020 Prepared by: Alphonzo Severance  Exercises Seated Calf Towel Stretch - 1-2 x daily - 7 x weekly - 1 sets - 3 reps - 30 hold Long Sitting Ankle Dorsiflexion with Anchored Resistance - 1 x daily - 7 x weekly - 3 sets - 10 reps - 2 sec hold Long Sitting Ankle Plantar Flexion with Resistance - 1 x daily - 7 x weekly - 3 sets - 10 reps - 2 sec hold Long Sitting Ankle Inversion with Resistance - 1 x daily - 7 x weekly - 3 sets - 10 reps - 2 sec hold Long Sitting Ankle Eversion with Resistance - 1 x daily - 7 x weekly - 3 sets - 10 reps - 2 sec hold Foot Roller Plantar Massage - 1 x daily - 7 x weekly - 1 sets - 1 reps - 2 min hold Seated Toe Towel Scrunches - 1 x daily - 7 x weekly - 3 sets - 10 reps

## 2020-07-04 NOTE — Therapy (Signed)
[facility] [address] [place], [place], [address] Phone: [phone]   Fax:  [phone]  Physical Therapy Treatment  Patient Details  Name: Andrew Bruce MRN: [id number] Date of Birth: January 17, 1965 Referring Provider (PT): Barbette Merino, NP   Encounter Date: 07/04/2020   PT End of Session - 07/04/20 1617     Visit Number 3    Number of Visits 13    Date for PT Re-Evaluation 08/03/20    Authorization Type MCD [place] - eval on 06/21/20    Activity Tolerance Patient tolerated treatment well    Behavior During Therapy [facility] for tasks assessed/performed             Past Medical History:  Diagnosis Date   COVID-19 02/06/2019   Diabetes mellitus without complication (HCC)    Hypertension    PE (pulmonary thromboembolism) (HCC)    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   Pneumonia    with Covid   Stroke (HCC)    weakness on right side, occurred while he had Covid    Past Surgical History:  Procedure Laterality Date   AMPUTATION Right 08/13/2019   Procedure: AMPUTATION RIGHT FIRST AND SECOND TOES;  Surgeon: Larina Earthly, MD;  Location: [facility] OR;  Service: Vascular;  Laterality: Right;   DIRECT LARYNGOSCOPY Bilateral 04/06/2019   Procedure: MICRO DIRECT LARYNGOSCOPY WITH PROLARYN INJECTION;  Surgeon: Christia Reading, MD;  Location: [facility] OR;  Service: ENT;  Laterality: Bilateral;   IR GASTROSTOMY TUBE MOD SED  03/10/2019   IR GASTROSTOMY TUBE REMOVAL  04/20/2019   WOUND DEBRIDEMENT Left 08/13/2019   Procedure: AMPUTATION  OF LEFT FIRST TOE, AND DEBRIDEMENT OF LEFT SECOND, THIRD AND FOURTH TOES;  Surgeon: Larina Earthly, MD;  Location: [facility] OR;  Service: Vascular;  Laterality: Left;    There were no vitals filed for this visit.   Subjective Assessment - 07/04/20 1617     Subjective Pt reports that he is feeling stronger, but continues to have about the same amount of pain.  He reports compliance with HEP.    Patient is  accompained by: Interpreter   [phone]   Pertinent History COVID in patient stay, hx of CVA, hx of PE, gangerene of toes    Limitations Walking;Standing;Lifting;House hold activities    How long can you stand comfortably? 30 minutes    How long can you walk comfortably? 15 min    Patient Stated Goals decrease foot pain and be able to do things that he was able to do before covid    Currently in Pain? Yes    Pain Score 6     Pain Location Foot    Pain Orientation Right;Left    Pain Onset More than a month ago                                [facility] Adult PT Treatment/Exercise - 07/04/20 0001       High Level Balance   High Level Balance Comments semi tandem 30'' x 3 ea with      Knee/Hip Exercises: Aerobic   Nustep 5 min L4 S9      Knee/Hip Exercises: Standing   Heel Raises Limitations 3x10      Ankle Exercises: Seated   Other Seated Ankle Exercises towel crunch - 2x2' ea      Ankle Exercises: Supine   T-Band ankle 4-way Blue  TB x 10 ea      Ankle Exercises: Stretches   Gastroc Stretch Limitations 2 x 30 sec ea with towel                      PT Short Term Goals - 06/22/20 1018       PT SHORT TERM GOAL #1   Title Pt will be compliant and knowledgeable with 90% of HEP in order to improve carryover    Baseline initial HEP given    Time 3    Period Weeks    Status New    Target Date 07/13/20      PT SHORT TERM GOAL #2   Title Pt will self report bilateral foot pain no greater than 5/10 at worst in order to improve comfort and function    Baseline 10/10 at worst    Time 3    Period Weeks    Status New    Target Date 07/13/20               PT Long Term Goals - 06/22/20 1019       PT LONG TERM GOAL #1   Title Pt will self report bilateral foot pain no greater than 3/10 at worst in order to improve comfort and function    Baseline 10/10 at worst    Time 6    Period Weeks    Status New    Target Date 08/03/20      PT LONG  TERM GOAL #2   Title Pt will improve bilateral DF AROM to no less than 12 degrees for improved mobility and decreased pain    Baseline see flowsheet    Time 6    Period Weeks    Status New    Target Date 08/03/20      PT LONG TERM GOAL #3   Title Pt will be compliant and knowledgeable with advanced HEP for continued improvement post discharge    Time 6    Period Weeks    Status New    Target Date 08/03/20                   Plan - 07/04/20 1831     Clinical Impression Statement Octavion Mollenkopf is progressing well with therapy.  Today we concentrated on ankle strengthening and balance/proprioception.  Pt shows consistent progress with strengthening, but also continues to endorse pain.  Pt reports no change in baseline pain following therapy.  Next session we will continue current course, progressing as able/appropriate.  HEP was updated and reissued to patient.  Pt will continue to benefit from skilled physical therapy to address remaining deficits and achieve listed goals.  Continue per POC.    Personal Factors and Comorbidities Comorbidity 2;Time since onset of injury/illness/exacerbation;Transportation    Comorbidities Hx of CVA, Gangerene of toes    Examination-Activity Limitations Hygiene/Grooming;Geophysical data processor for Others;Squat;Stand;Lift    Camera operator;Yard Work;Driving;Community Activity    Stability/Clinical Decision Making Stable/Uncomplicated    Rehab Potential Good    PT Frequency 2x / week    PT Duration 6 weeks    PT Treatment/Interventions ADLs/Self Care Home Management;Gait training;Stair training;Functional mobility training;Therapeutic exercise;Therapeutic activities;Balance training;Patient/family education;Passive range of motion;Manual techniques;Energy conservation;Visual/perceptual remediation/compensation;Joint Manipulations;Neuromuscular re-education    PT Next Visit Plan assess response to HEP; progress as  able    PT Home Exercise Plan [email or web address]    Consulted and Agree with Plan of Care Patient  Patient will benefit from skilled therapeutic intervention in order to improve the following deficits and impairments:  Abnormal gait, Decreased balance, Decreased mobility, Decreased endurance, Decreased range of motion, Decreased strength, Difficulty walking, Impaired sensation, Pain, Decreased activity tolerance  Visit Diagnosis: Muscle weakness (generalized)  Pain in left foot  Pain in right foot  Unsteadiness on feet  Other abnormalities of gait and mobility     Problem List Patient Active Problem List   Diagnosis Date Noted   Uncontrolled hypertension 02/18/2020   Impaired functional mobility, balance, gait, and endurance 08/23/2019   Amputation of great toe (HCC) 08/23/2019   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   Impaired ambulation 06/11/2019   Dependent on walker for ambulation 04/30/2019   Right hemiparesis (HCC) 04/30/2019   Tachycardia 04/30/2019   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   Acute blood loss anemia    Uncontrolled type 2 diabetes mellitus with hyperglycemia (HCC)    Dry gangrene (HCC)    Leukocytosis    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   Status post tracheostomy (HCC)    Pressure injury of skin 03/02/2019   On mechanically assisted ventilation (HCC)    Goals of care, counseling/discussion    Palliative care encounter    ARDS (adult respiratory distress syndrome) (HCC)    Cerebral embolism with cerebral infarction 02/17/2019   Acute respiratory failure with hypoxia [facility])    Pneumothorax, right     Fredderick Phenix 07/04/2020, 6:31 PM  [facility] Outpatient Rehabilitation [facility] [address] [place], [place], [address] Phone: [phone]   Fax:  [phone]  Name: Nike Southwell MRN: [id number] Date of Birth: [birth year]

## 2020-07-05 ENCOUNTER — Encounter: Payer: Medicaid Other | Admitting: Gastroenterology

## 2020-07-05 ENCOUNTER — Telehealth: Payer: Self-pay | Admitting: Gastroenterology

## 2020-07-05 NOTE — Telephone Encounter (Signed)
Noted  

## 2020-07-06 ENCOUNTER — Other Ambulatory Visit: Payer: Self-pay

## 2020-07-06 ENCOUNTER — Ambulatory Visit (HOSPITAL_COMMUNITY)
Admission: RE | Admit: 2020-07-06 | Discharge: 2020-07-06 | Disposition: A | Discharge status: Home / Self Care | Payer: Medicaid Other | Source: Ambulatory Visit | Attending: Vascular Surgery | Admitting: Vascular Surgery

## 2020-07-06 ENCOUNTER — Ambulatory Visit (INDEPENDENT_AMBULATORY_CARE_PROVIDER_SITE_OTHER): Payer: Medicaid Other | Admitting: Physician Assistant

## 2020-07-06 ENCOUNTER — Ambulatory Visit: Payer: Medicaid Other

## 2020-07-06 VITALS — BP 173/101 | HR 78 | Temp 98.4°F | Resp 20 | Ht 66.0 in | Wt 199.9 lb

## 2020-07-06 DIAGNOSIS — M79672 Pain in left foot: Secondary | ICD-10-CM

## 2020-07-06 DIAGNOSIS — R2689 Other abnormalities of gait and mobility: Secondary | ICD-10-CM

## 2020-07-06 DIAGNOSIS — L97529 Non-pressure chronic ulcer of other part of left foot with unspecified severity: Secondary | ICD-10-CM | POA: Insufficient documentation

## 2020-07-06 DIAGNOSIS — M6281 Muscle weakness (generalized): Secondary | ICD-10-CM | POA: Diagnosis not present

## 2020-07-06 DIAGNOSIS — R2681 Unsteadiness on feet: Secondary | ICD-10-CM

## 2020-07-06 DIAGNOSIS — M76821 Posterior tibial tendinitis, right leg: Secondary | ICD-10-CM

## 2020-07-06 DIAGNOSIS — L97519 Non-pressure chronic ulcer of other part of right foot with unspecified severity: Secondary | ICD-10-CM | POA: Diagnosis not present

## 2020-07-06 DIAGNOSIS — M79671 Pain in right foot: Secondary | ICD-10-CM

## 2020-07-06 DIAGNOSIS — M76822 Posterior tibial tendinitis, left leg: Secondary | ICD-10-CM

## 2020-07-06 NOTE — Therapy (Signed)
[facility] Rehabilitation [facility] [address] [place], [place], [address] Phone: [phone]   Fax:  [phone]  Physical Therapy Treatment  Patient Details  Name: Andrew Bruce MRN: [id number] Date of Birth: 15-Mar-1965 Referring Provider (PT): Barbette Merino, NP   Encounter Date: 07/06/2020   PT End of Session - 07/06/20 1648     Visit Number 4    Number of Visits 13    Date for PT Re-Evaluation 08/03/20    Authorization Type MCD [place] - eval on 06/21/20    PT Start Time 1650    PT Stop Time 1730    PT Time Calculation (min) 40 min    Activity Tolerance Patient tolerated treatment well    Behavior During Therapy [facility] for tasks assessed/performed             Past Medical History:  Diagnosis Date   COVID-19 02/06/2019   Diabetes mellitus without complication (HCC)    Hypertension    PE (pulmonary thromboembolism) (HCC)    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   Pneumonia    with Covid   Stroke (HCC)    weakness on right side, occurred while he had Covid    Past Surgical History:  Procedure Laterality Date   AMPUTATION Right 08/13/2019   Procedure: AMPUTATION RIGHT FIRST AND SECOND TOES;  Surgeon: Larina Earthly, MD;  [facility] OR;  Service: Vascular;  Laterality: Right;   DIRECT LARYNGOSCOPY Bilateral 04/06/2019   Procedure: MICRO DIRECT LARYNGOSCOPY WITH PROLARYN INJECTION;  Surgeon: Christia Reading, MD;  Location: [facility] OR;  Service: ENT;  Laterality: Bilateral;   IR GASTROSTOMY TUBE MOD SED  03/10/2019   IR GASTROSTOMY TUBE REMOVAL  04/20/2019   WOUND DEBRIDEMENT Left 08/13/2019   Procedure: AMPUTATION  OF LEFT FIRST TOE, AND DEBRIDEMENT OF LEFT SECOND, THIRD AND FOURTH TOES;  Surgeon: Larina Earthly, MD;  Location: [facility] OR;  Service: Vascular;  Laterality: Left;    There were no vitals filed for this visit.   Subjective Assessment - 07/06/20 1649     Subjective Pt presents to PT stating he is feeling a little better.  He has continued to be compliant with his HEP with no adverse effect. Pt is ready to begin PT treatment at this time.    Patient is accompained by: Interpreter   Noemi [phone]   Currently in Pain? Yes    Pain Score 6     Pain Location Foot    Pain Orientation Right;Left                               [facility] Adult PT Treatment/Exercise - 07/06/20 0001       Knee/Hip Exercises: Aerobic   Nustep lvl 5 x 5 min UE/LE while taking subjective      Ankle Exercises: Seated   Other Seated Ankle Exercises towel crunch - 2x2' ea      Ankle Exercises: Stretches   Gastroc Stretch Limitations 2x60 sec ea with towel    Slant Board Stretch 30 seconds      Ankle Exercises: Supine   T-Band ankle 4-way Blue TB x 10 ea      Ankle Exercises: Standing   Other Standing Ankle Exercises heel raises 2x15                      PT Short Term Goals - 06/22/20 1018  PT SHORT TERM GOAL #1   Title Pt will be compliant and knowledgeable with 90% of HEP in order to improve carryover    Baseline initial HEP given    Time 3    Period Weeks    Status New    Target Date 07/13/20      PT SHORT TERM GOAL #2   Title Pt will self report bilateral foot pain no greater than 5/10 at worst in order to improve comfort and function    Baseline 10/10 at worst    Time 3    Period Weeks    Status New    Target Date 07/13/20               PT Long Term Goals - 06/22/20 1019       PT LONG TERM GOAL #1   Title Pt will self report bilateral foot pain no greater than 3/10 at worst in order to improve comfort and function    Baseline 10/10 at worst    Time 6    Period Weeks    Status New    Target Date 08/03/20      PT LONG TERM GOAL #2   Title Pt will improve bilateral DF AROM to no less than 12 degrees for improved mobility and decreased pain    Baseline see flowsheet    Time 6    Period Weeks    Status New    Target Date 08/03/20      PT LONG TERM GOAL #3   Title  Pt will be compliant and knowledgeable with advanced HEP for continued improvement post discharge    Time 6    Period Weeks    Status New    Target Date 08/03/20                   Plan - 07/06/20 1733     Clinical Impression Statement Pt was able to complete all prescribed exercises with no adverse effect or increase in pain. He continues to benefit from skilled PT services and is showing improved functional mobility. Will continue to progress as able per POC.    PT Treatment/Interventions ADLs/Self Care Home Management;Gait training;Stair training;Functional mobility training;Therapeutic exercise;Therapeutic activities;Balance training;Patient/family education;Passive range of motion;Manual techniques;Energy conservation;Visual/perceptual remediation/compensation;Joint Manipulations;Neuromuscular re-education    PT Next Visit Plan assess response to HEP; progress as able    PT Home Exercise Plan [email or web address]             Patient will benefit from skilled therapeutic intervention in order to improve the following deficits and impairments:  Abnormal gait, Decreased balance, Decreased mobility, Decreased endurance, Decreased range of motion, Decreased strength, Difficulty walking, Impaired sensation, Pain, Decreased activity tolerance  Visit Diagnosis: Muscle weakness (generalized)  Pain in left foot  Pain in right foot  Unsteadiness on feet  Other abnormalities of gait and mobility     Problem List Patient Active Problem List   Diagnosis Date Noted   Uncontrolled hypertension 02/18/2020   Impaired functional mobility, balance, gait, and endurance 08/23/2019   Amputation of great toe (HCC) 08/23/2019   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   Impaired ambulation 06/11/2019   Dependent on walker for ambulation 04/30/2019   Right hemiparesis (HCC) 04/30/2019   Tachycardia 04/30/2019   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   Acute blood loss  anemia    Uncontrolled type 2 diabetes mellitus with hyperglycemia (HCC)    Dry gangrene (HCC)  Leukocytosis    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   Status post tracheostomy (HCC)    Pressure injury of skin 03/02/2019   On mechanically assisted ventilation (HCC)    Goals of care, counseling/discussion    Palliative care encounter    ARDS (adult respiratory distress syndrome) (HCC)    Cerebral embolism with cerebral infarction 02/17/2019   Acute respiratory failure with hypoxia (HCC)    Pneumothorax, right     Eloy End, PT, DPT 07/06/20 5:37 PM  [facility] [address] [place], [place], [address] Phone: [phone]   Fax:  [phone]  Name: Andrew Bruce MRN: [id number] Date of Birth: 08-07-1965

## 2020-07-06 NOTE — Progress Notes (Signed)
History of Present Illness:  Patient is a 55 y.o. year old male who presents for evaluation of B lower leg and ankle pain.  He is s/p Amputation of left first second third and fourth toes and right first and second toe due to Gangrene.    The patient has a history of COVID-19 pneumonia with ARDS with a prolonged hospitalization including tracheostomy mechanical ventilation.  He was seen in the hospital secondary to what was felt to be pressure induced ischemic changes to his toes. He states the pain started 2 weeks ago and is medially on B ankles and runs up his calf.  He continues to do OP PT.   He denise new wounds, rest pain or loss of sensation.    Past Medical History:  Diagnosis Date   COVID-19 02/06/2019   Diabetes mellitus without complication (HCC)    Hypertension    PE (pulmonary thromboembolism) ([place])    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   Pneumonia    with Covid   Stroke ([place])    weakness on right side, occurred while he had Covid    Past Surgical History:  Procedure Laterality Date   AMPUTATION Right 08/13/2019   Procedure: AMPUTATION RIGHT FIRST AND SECOND TOES;  Surgeon: Rosetta Posner, MD;  Location: [facility];  Service: Vascular;  Laterality: Right;   DIRECT LARYNGOSCOPY Bilateral 04/06/2019   Procedure: MICRO DIRECT LARYNGOSCOPY WITH PROLARYN INJECTION;  Surgeon: Melida Quitter, MD;  Location: [facility];  Service: ENT;  Laterality: Bilateral;   IR GASTROSTOMY TUBE MOD SED  03/10/2019   IR GASTROSTOMY TUBE REMOVAL  04/20/2019   WOUND DEBRIDEMENT Left 08/13/2019   Procedure: AMPUTATION  OF LEFT FIRST TOE, AND DEBRIDEMENT OF LEFT SECOND, THIRD AND FOURTH TOES;  Surgeon: Rosetta Posner, MD;  [facility];  Service: Vascular;  Laterality: Left;    ROS:   General:  No weight loss, Fever, chills  HEENT: No recent headaches, no nasal bleeding, no visual changes, no sore throat  Neurologic: No dizziness, blackouts, seizures. No recent symptoms of stroke or  mini- stroke. No recent episodes of slurred speech, or temporary blindness.  Cardiac: No recent episodes of chest pain/pressure, no shortness of breath at rest.  No shortness of breath with exertion.  Denies history of atrial fibrillation or irregular heartbeat  Vascular: No history of rest pain in feet.  No history of claudication.  No history of non-healing ulcer, No history of DVT   Pulmonary: No home oxygen, no productive cough, no hemoptysis,  No asthma or wheezing  Musculoskeletal:  [ ]  Arthritis, [ ]  Low back pain,  [ ]  Joint pain  Hematologic:No history of hypercoagulable state.  No history of easy bleeding.  No history of anemia  Gastrointestinal: No hematochezia or melena,  No gastroesophageal reflux, no trouble swallowing  Urinary: [ ]  chronic Kidney disease, [ ]  on HD - [ ]  MWF or [ ]  TTHS, [ ]  Burning with urination, [ ]  Frequent urination, [ ]  Difficulty urinating;   Skin: No rashes  Psychological: No history of anxiety,  No history of depression  Social History Social History   Tobacco Use   Smoking status: Former    Pack years: 0.00   Smokeless tobacco: Never  Vaping Use   Vaping Use: Never used  Substance Use Topics   Alcohol use: Yes    Comment: rare to occasional   Drug use: Never    Family History Family  History  Problem Relation Age of Onset   Healthy Sister    Healthy Brother     Allergies  No Known Allergies   Current Outpatient Medications  Medication Sig Dispense Refill   blood glucose meter kit and supplies KIT Dispense based on patient and insurance preference. Use up to four times daily as directed. (FOR ICD-9 250.00, 250.01). 1 each 0   carboxymethylcellulose 1 % ophthalmic solution Apply 1 drop to eye 3 (three) times daily. 30 mL 0   dextromethorphan-guaiFENesin (MUCINEX DM) 30-600 MG 12hr tablet Take 1 tablet by mouth 2 (two) times daily. 30 tablet 0   DULoxetine (CYMBALTA) 60 MG capsule Take 1 capsule (60 mg total) by mouth daily.  For nerve pain 30 capsule 11   glipiZIDE (GLUCOTROL) 5 MG tablet Take 0.5 tablets (2.5 mg total) by mouth daily before breakfast. 15 tablet 5   lisinopril (ZESTRIL) 10 MG tablet Take 1 tablet (10 mg total) by mouth daily. 90 tablet 3   metFORMIN (GLUCOPHAGE) 500 MG tablet Take 1 tablet (500 mg total) by mouth 2 (two) times daily with a meal. 60 tablet 5   metoprolol tartrate (LOPRESSOR) 25 MG tablet Take 1 tablet (25 mg total) by mouth 2 (two) times daily. For blood pressure 60 tablet 5   traMADol (ULTRAM) 50 MG tablet Take 2 tablets (100 mg total) by mouth 3 (three) times daily as needed for moderate pain or severe pain. 180 tablet 5   No current facility-administered medications for this visit.    Physical Examination  Vitals:   07/06/20 0829  BP: (!) 173/101  Pulse: 78  Resp: 20  Temp: 98.4 F (36.9 C)  TempSrc: Temporal  SpO2: 97%  Weight: 199 lb 14.4 oz (90.7 kg)  Height: 5' 6"  (1.676 m)    Body mass index is 32.26 kg/m.  General:  Alert and oriented, no acute distress HEENT: Normal Neck: No bruit or JVD Pulmonary: Clear to auscultation bilaterally Cardiac: Regular Rate and Rhythm without murmur Abdomen: Soft, non-tender, non-distended, no mass, no scars Skin: No rash Extremity Pulses:  2+ radial, brachial, femoral, dorsalis pedis, posterior tibial pulses bilaterally Musculoskeletal: No  edema, tenderness to palpation along the PT tendon B.  Neurologic: Upper and lower extremity motor 5/5 and symmetric  DATA:  ABI Findings:  +---------+------------------+-----+----------+--------+  Right    Rt Pressure (mmHg)IndexWaveform  Comment   +---------+------------------+-----+----------+--------+  Brachial 209                                        +---------+------------------+-----+----------+--------+  PTA      252               1.21 triphasic           +---------+------------------+-----+----------+--------+  DP       250               1.20  triphasic           +---------+------------------+-----+----------+--------+  Great Toe                       amputation          +---------+------------------+-----+----------+--------+   +---------+------------------+-----+----------+-------+  Left     Lt Pressure (mmHg)IndexWaveform  Comment  +---------+------------------+-----+----------+-------+  PTA      249               1.19 triphasic          +---------+------------------+-----+----------+-------+  DP       238               1.14 triphasic          +---------+------------------+-----+----------+-------+  Great Toe                       amputation         +---------+------------------+-----+----------+-------+   +-------+-----------+-----------+------------+------------+  ABI/TBIToday's ABIToday's TBIPrevious ABIPrevious TBI  +-------+-----------+-----------+------------+------------+  Right  1.18       amputation                           +-------+-----------+-----------+------------+------------+  Left   1.16       amputation                           +-------+-----------+-----------+------------+------------+    Summary:  Right: Resting right ankle-brachial index is within normal range. No  evidence of significant right lower extremity arterial disease.   Left: Resting left ankle-brachial index is within normal range. No  evidence of significant left lower extremity arterial disease.   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  He is working with PT for mobility and has developed PT tendonitis.  His gait has had to change significantly now that he has lost multiple toes.  I gave him a prescription for orthopaedic shoes with inserts and toe spacers to assit with the gait andhelp the tendonitis.  He will f/u PRN.  He has excellent palpable pedal pulses B without symptoms of ischemia.       Roxy Horseman PA-C [facility] Office: [phone]  MD in clinic [place]

## 2020-07-11 ENCOUNTER — Other Ambulatory Visit: Payer: Self-pay

## 2020-07-11 ENCOUNTER — Encounter: Payer: Self-pay | Admitting: Physical Therapy

## 2020-07-11 ENCOUNTER — Ambulatory Visit: Payer: Medicaid Other | Admitting: Physical Therapy

## 2020-07-11 DIAGNOSIS — M79672 Pain in left foot: Secondary | ICD-10-CM

## 2020-07-11 DIAGNOSIS — R2681 Unsteadiness on feet: Secondary | ICD-10-CM

## 2020-07-11 DIAGNOSIS — M6281 Muscle weakness (generalized): Secondary | ICD-10-CM | POA: Diagnosis not present

## 2020-07-11 DIAGNOSIS — R2689 Other abnormalities of gait and mobility: Secondary | ICD-10-CM

## 2020-07-11 DIAGNOSIS — M79671 Pain in right foot: Secondary | ICD-10-CM

## 2020-07-11 NOTE — Therapy (Signed)
[facility] Outpatient Rehabilitation [facility] [address] [place], [place], [address] Phone: [phone]   Fax:  [phone]  Physical Therapy Treatment  Patient Details  Name: Andrew Bruce MRN: [id number] Date of Birth: Apr 02, 1965 Referring Provider (PT): Barbette Merino, NP    Encounter Date: 07/11/2020   PT End of Session - 07/11/20 1646     Visit Number 5    Number of Visits 13    Date for PT Re-Evaluation 08/03/20    Authorization Type MCD [employer] - eval on 06/21/20    PT Start Time 1645    PT Stop Time 1730    PT Time Calculation (min) 45 min    Activity Tolerance Patient tolerated treatment well    Behavior During Therapy [facility] for tasks assessed/performed             Past Medical History:  Diagnosis Date   COVID-19 02/06/2019   Diabetes mellitus without complication (HCC)    Hypertension    PE (pulmonary thromboembolism) (HCC)    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   Pneumonia    with Covid   Stroke (HCC)    weakness on right side, occurred while he had Covid    Past Surgical History:  Procedure Laterality Date   AMPUTATION Right 08/13/2019   Procedure: AMPUTATION RIGHT FIRST AND SECOND TOES;  Surgeon: Larina Earthly, MD;  [facility];  Service: Vascular;  Laterality: Right;   DIRECT LARYNGOSCOPY Bilateral 04/06/2019   Procedure: MICRO DIRECT LARYNGOSCOPY WITH PROLARYN INJECTION;  Surgeon: Christia Reading, MD;  Location: [facility] OR;  Service: ENT;  Laterality: Bilateral;   IR GASTROSTOMY TUBE MOD SED  03/10/2019   IR GASTROSTOMY TUBE REMOVAL  04/20/2019   WOUND DEBRIDEMENT Left 08/13/2019   Procedure: AMPUTATION  OF LEFT FIRST TOE, AND DEBRIDEMENT OF LEFT SECOND, THIRD AND FOURTH TOES;  Surgeon: Larina Earthly, MD;  [facility];  Service: Vascular;  Laterality: Left;    There were no vitals filed for this visit.   Subjective Assessment - 07/11/20 1648     Subjective Pt reports that he is feeling better.  He feels his pain  is improving.  He has been HEP compliant.  He reports these are challenging.    Patient is accompained by: Interpreter   Lanora Manis [phone]   Currently in Pain? Yes    Pain Score 5     Pain Location Foot    Pain Orientation Right;Left                               [facility] Adult PT Treatment/Exercise - 07/11/20 0001       High Level Balance   High Level Balance Comments semi tandem on foam 30'' x4 ea with, rocker board no UE sagital plane      Knee/Hip Exercises: Aerobic   Recumbent Bike lvl 3 x 5 min UE/LE while taking subjective      Knee/Hip Exercises: Machines for Strengthening   Cybex Leg Press 85# 3x5      Knee/Hip Exercises: Standing   Heel Raises Limitations 3x10 on step 1.5''    Other Standing Knee Exercises lunge to bosu 2x10 ea      Ankle Exercises: Stretches   Slant Board Stretch 3 reps;30 seconds                      PT Short Term Goals -  06/22/20 1018       PT SHORT TERM GOAL #1   Title Pt will be compliant and knowledgeable with 90% of HEP in order to improve carryover    Baseline initial HEP given    Time 3    Period Weeks    Status New    Target Date 07/13/20      PT SHORT TERM GOAL #2   Title Pt will self report bilateral foot pain no greater than 5/10 at worst in order to improve comfort and function    Baseline 10/10 at worst    Time 3    Period Weeks    Status New    Target Date 07/13/20               PT Long Term Goals - 06/22/20 1019       PT LONG TERM GOAL #1   Title Pt will self report bilateral foot pain no greater than 3/10 at worst in order to improve comfort and function    Baseline 10/10 at worst    Time 6    Period Weeks    Status New    Target Date 08/03/20      PT LONG TERM GOAL #2   Title Pt will improve bilateral DF AROM to no less than 12 degrees for improved mobility and decreased pain    Baseline see flowsheet    Time 6    Period Weeks    Status New    Target Date 08/03/20       PT LONG TERM GOAL #3   Title Pt will be compliant and knowledgeable with advanced HEP for continued improvement post discharge    Time 6    Period Weeks    Status New    Target Date 08/03/20                   Plan - 07/11/20 1643     Clinical Impression Statement Pt reports no increase in baseline pain following therapy  HEP was updated and reissued to patient    Overall, Zyren Sevigny is progressing well with therapy.  Today we concentrated on lower extremity strengthening, balance/proprioception, and foot strength .  Pt is able to progress heel raises to step and progress balance significantly with minimal LOB.  Pt will continue to benefit from skilled physical therapy to address remaining deficits and achieve listed goals.  Continue per POC.    Examination-Participation Restrictions Laundry    PT Treatment/Interventions ADLs/Self Care Home Management;Gait training;Stair training;Functional mobility training;Therapeutic exercise;Therapeutic activities;Balance training;Patient/family education;Passive range of motion;Manual techniques;Energy conservation;Visual/perceptual remediation/compensation;Joint Manipulations;Neuromuscular re-education    PT Next Visit Plan assess response to HEP; progress as able    PT Home Exercise Plan [email or web address]             Patient will benefit from skilled therapeutic intervention in order to improve the following deficits and impairments:  Abnormal gait, Decreased balance, Decreased mobility, Decreased endurance, Decreased range of motion, Decreased strength, Difficulty walking, Impaired sensation, Pain, Decreased activity tolerance  Visit Diagnosis: Muscle weakness (generalized)  Pain in left foot  Pain in right foot  Unsteadiness on feet  Other abnormalities of gait and mobility     Problem List Patient Active Problem List   Diagnosis Date Noted   Uncontrolled hypertension 02/18/2020   Impaired functional mobility, balance,  gait, and endurance 08/23/2019   Amputation of great toe (HCC) 08/23/2019   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   Impaired ambulation 06/11/2019  Dependent on walker for ambulation 04/30/2019   Right hemiparesis (HCC) 04/30/2019   Tachycardia 04/30/2019   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   Acute blood loss anemia    Uncontrolled type 2 diabetes mellitus with hyperglycemia (HCC)    Dry gangrene (HCC)    Leukocytosis    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   Status post tracheostomy (HCC)    Pressure injury of skin 03/02/2019   On mechanically assisted ventilation (HCC)    Goals of care, counseling/discussion    Palliative care encounter    ARDS (adult respiratory distress syndrome) (HCC)    Cerebral embolism with cerebral infarction 02/17/2019   Acute respiratory failure with hypoxia (HCC)    Pneumothorax, right     Fredderick Phenix 07/11/2020, 5:29 PM  [facility] [address] [place], [place], [address] Phone: [phone]   Fax:  [phone]  Name: Malvin Morrish MRN: [id number] Date of Birth: 1965-12-09

## 2020-07-13 ENCOUNTER — Ambulatory Visit: Payer: Medicaid Other

## 2020-07-13 ENCOUNTER — Other Ambulatory Visit: Payer: Self-pay

## 2020-07-13 DIAGNOSIS — M79671 Pain in right foot: Secondary | ICD-10-CM

## 2020-07-13 DIAGNOSIS — R2681 Unsteadiness on feet: Secondary | ICD-10-CM

## 2020-07-13 DIAGNOSIS — M6281 Muscle weakness (generalized): Secondary | ICD-10-CM | POA: Diagnosis not present

## 2020-07-13 DIAGNOSIS — R2689 Other abnormalities of gait and mobility: Secondary | ICD-10-CM

## 2020-07-13 DIAGNOSIS — M79672 Pain in left foot: Secondary | ICD-10-CM

## 2020-07-13 NOTE — Therapy (Signed)
[facility] Outpatient Rehabilitation [facility] [address] [place], [place], [address] Phone: [phone]   Fax:  [phone]  Physical Therapy Treatment  Patient Details  Name: Andrew Bruce MRN: [id number] Date of Birth: 30-Apr-1965 Referring Provider (PT): Barbette Merino, NP   Encounter Date: 07/13/2020   PT End of Session - 07/13/20 1522     Visit Number 6    Number of Visits 13    Date for PT Re-Evaluation 08/03/20    Authorization Type MCD [place] - eval on 06/21/20    PT Start Time 1530    PT Stop Time 1608    PT Time Calculation (min) 38 min    Activity Tolerance Patient tolerated treatment well    Behavior During Therapy [facility] for tasks assessed/performed             Past Medical History:  Diagnosis Date   COVID-19 02/06/2019   Diabetes mellitus without complication (HCC)    Hypertension    PE (pulmonary thromboembolism) (HCC)    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   Pneumonia    with Covid   Stroke (HCC)    weakness on right side, occurred while he had Covid    Past Surgical History:  Procedure Laterality Date   AMPUTATION Right 08/13/2019   Procedure: AMPUTATION RIGHT FIRST AND SECOND TOES;  Surgeon: Larina Earthly, MD;  [facility];  Service: Vascular;  Laterality: Right;   DIRECT LARYNGOSCOPY Bilateral 04/06/2019   Procedure: MICRO DIRECT LARYNGOSCOPY WITH PROLARYN INJECTION;  Surgeon: Christia Reading, MD;  Location: [facility] OR;  Service: ENT;  Laterality: Bilateral;   IR GASTROSTOMY TUBE MOD SED  03/10/2019   IR GASTROSTOMY TUBE REMOVAL  04/20/2019   WOUND DEBRIDEMENT Left 08/13/2019   Procedure: AMPUTATION  OF LEFT FIRST TOE, AND DEBRIDEMENT OF LEFT SECOND, THIRD AND FOURTH TOES;  Surgeon: Larina Earthly, MD;  [facility];  Service: Vascular;  Laterality: Left;    There were no vitals filed for this visit.   Subjective Assessment - 07/13/20 1524     Subjective Pt presents to PT with reports of continued bilateral feet  pain, although it continues to improve. Pt is ready to begin PT at this time.    Patient is accompained by: Interpreter   Drinda Butts [phone]   Currently in Pain? Yes    Pain Score 4     Pain Location Foot    Pain Orientation Right;Left                               [facility] Adult PT Treatment/Exercise - 07/13/20 0001       Knee/Hip Exercises: Machines for Strengthening   Cybex Leg Press 85# 3x6      Knee/Hip Exercises: Standing   Heel Raises Limitations 3x10 on step 1.5''    Other Standing Knee Exercises lunge to bosu 2x10 ea    Other Standing Knee Exercises tandem stance on foam 2x30 sec ea      Ankle Exercises: Supine   T-Band ankle 4-way Blue TB x 10 ea      Ankle Exercises: Seated   Towel Crunch --   x 60 sec ea                     PT Short Term Goals - 06/22/20 1018       PT SHORT TERM GOAL #1   Title  Pt will be compliant and knowledgeable with 90% of HEP in order to improve carryover    Baseline initial HEP given    Time 3    Period Weeks    Status New    Target Date 07/13/20      PT SHORT TERM GOAL #2   Title Pt will self report bilateral foot pain no greater than 5/10 at worst in order to improve comfort and function    Baseline 10/10 at worst    Time 3    Period Weeks    Status New    Target Date 07/13/20               PT Long Term Goals - 06/22/20 1019       PT LONG TERM GOAL #1   Title Pt will self report bilateral foot pain no greater than 3/10 at worst in order to improve comfort and function    Baseline 10/10 at worst    Time 6    Period Weeks    Status New    Target Date 08/03/20      PT LONG TERM GOAL #2   Title Pt will improve bilateral DF AROM to no less than 12 degrees for improved mobility and decreased pain    Baseline see flowsheet    Time 6    Period Weeks    Status New    Target Date 08/03/20      PT LONG TERM GOAL #3   Title Pt will be compliant and knowledgeable with advanced HEP for  continued improvement post discharge    Time 6    Period Weeks    Status New    Target Date 08/03/20                   Plan - 07/13/20 1615     Clinical Impression Statement Pt is progressing as expected with therapy, with decreased pain and improving strength/balance noted. He continues to benefit from skilled PT services and should continue to be seen per POC as prescribed.    PT Treatment/Interventions ADLs/Self Care Home Management;Gait training;Stair training;Functional mobility training;Therapeutic exercise;Therapeutic activities;Balance training;Patient/family education;Passive range of motion;Manual techniques;Energy conservation;Visual/perceptual remediation/compensation;Joint Manipulations;Neuromuscular re-education    PT Next Visit Plan continue to prorgress as able    PT Home Exercise Plan [id number]             Patient will benefit from skilled therapeutic intervention in order to improve the following deficits and impairments:  Abnormal gait, Decreased balance, Decreased mobility, Decreased endurance, Decreased range of motion, Decreased strength, Difficulty walking, Impaired sensation, Pain, Decreased activity tolerance  Visit Diagnosis: Muscle weakness (generalized)  Pain in left foot  Pain in right foot  Unsteadiness on feet  Other abnormalities of gait and mobility     Problem List Patient Active Problem List   Diagnosis Date Noted   Uncontrolled hypertension 02/18/2020   Impaired functional mobility, balance, gait, and endurance 08/23/2019   Amputation of great toe (HCC) 08/23/2019   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   Impaired ambulation 06/11/2019   Dependent on walker for ambulation 04/30/2019   Right hemiparesis (HCC) 04/30/2019   Tachycardia 04/30/2019   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   Acute blood loss anemia    Uncontrolled type 2 diabetes mellitus with hyperglycemia (HCC)    Dry gangrene (HCC)     Leukocytosis    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   Status post tracheostomy (HCC)  Pressure injury of skin 03/02/2019   On mechanically assisted ventilation (HCC)    Goals of care, counseling/discussion    Palliative care encounter    ARDS (adult respiratory distress syndrome) (HCC)    Cerebral embolism with cerebral infarction 02/17/2019   Acute respiratory failure with hypoxia (HCC)    Pneumothorax, right     Eloy End, PT, DPT 07/13/20 4:18 PM  [facility] [address] [place], [place], [address] Phone: [phone]   Fax:  [phone]  Name: Andrew Bruce MRN: [id number] Date of Birth: [birth year]

## 2020-07-14 ENCOUNTER — Encounter: Payer: Self-pay | Admitting: Physical Medicine and Rehabilitation

## 2020-07-14 ENCOUNTER — Encounter
Payer: Medicaid Other | Attending: Physical Medicine and Rehabilitation | Admitting: Physical Medicine and Rehabilitation

## 2020-07-14 ENCOUNTER — Telehealth: Payer: Self-pay

## 2020-07-14 ENCOUNTER — Other Ambulatory Visit: Payer: Self-pay

## 2020-07-14 VITALS — BP 191/105 | HR 76 | Temp 98.4°F | Ht 67.0 in | Wt 199.0 lb

## 2020-07-14 DIAGNOSIS — J3801 Paralysis of vocal cords and larynx, unilateral: Secondary | ICD-10-CM | POA: Insufficient documentation

## 2020-07-14 DIAGNOSIS — G8191 Hemiplegia, unspecified affecting right dominant side: Secondary | ICD-10-CM | POA: Diagnosis not present

## 2020-07-14 DIAGNOSIS — R2689 Other abnormalities of gait and mobility: Secondary | ICD-10-CM | POA: Diagnosis not present

## 2020-07-14 DIAGNOSIS — I69398 Other sequelae of cerebral infarction: Secondary | ICD-10-CM | POA: Insufficient documentation

## 2020-07-14 DIAGNOSIS — I63119 Cerebral infarction due to embolism of unspecified vertebral artery: Secondary | ICD-10-CM | POA: Diagnosis not present

## 2020-07-14 DIAGNOSIS — S98119A Complete traumatic amputation of unspecified great toe, initial encounter: Secondary | ICD-10-CM | POA: Diagnosis not present

## 2020-07-14 MED ORDER — TRAMADOL HCL 50 MG PO TABS: 100.0000 mg | ORAL_TABLET | Freq: Three times a day (TID) | ORAL | 5 refills | Status: DC | PRN

## 2020-07-14 NOTE — Patient Instructions (Addendum)
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 Original admission 01/26/19- and came to CIR 03/23/19- discharged- 04/20/19.  Has visual difficulties- double vision with   Needs to see PCP as soon as possible- - due to very elevated BP- 180s-190s/100s- even on BP meds.  Went back to [employer]- per their request and still has he doesn't qualify- is $500- which is ridiculous. Tried both at [employer]- and neither would cover it.  Needs Immigration paperwork filled out. Is 9 pages of paperwork-  Needs to use a cane! to help with balance.  Spent a prolonged period filling out paperwork and discussing what is necessary to fill out these documents- 9 pages worth. Has to be done by 08/02/20 and sent back.  Needs refills- of Cymbalta/Duloxetine (had 11 refills) and Tramadol- had 5 refills- but will be due before next appointment,  Stick to Tramadol 100 mg (2 tabs) 3x/day- not 4x/day- or can cause kidney issues.  "Disability has refused to help him"- needs to find a Clinical research associate.  F/U in 73months Cannot increase Duloxetine- is on for depression AND pain.

## 2020-07-14 NOTE — Telephone Encounter (Signed)
Pt is asking a form of his labs for medicaid Can you please call him

## 2020-07-14 NOTE — Progress Notes (Signed)
Subjective:    Patient ID: Andrew Bruce, male    DOB: 12/04/1965, 55 y.o.   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    BP is poorly controlled- is 180s-190s over 100s here today-  Has been taking BP meds- but BP doesn't go down.  Has appointment in October 2022-  Pt has had difficulties with R eye- ever since his stroke- difficulty to read- sees double when looking at a piece of paper.  Also has associated headaches.   Difficulty writing due to fine motor coordination impairment since stroke.   Didn't get shoe inserts- Medicaid declined it 3 times. It costs $500.  Don't know why they declined them    No falls, but has had many near falls- last one Monday.    Taking tramadol 2 tabs 3-4x/day-    Pain Inventory Average Pain 7 Pain Right Now 8 My pain is tingling and aching  In the last 24 hours, has pain interfered with the following? General activity 8 Relation with others 0 Enjoyment of life 0 What TIME of day is your pain at its worst? evening Sleep (in general) Fair  Pain is worse with: walking, bending, sitting, standing, and some activites Pain improves with: rest, heat/ice, therapy/exercise, pacing activities, medication, and injections Relief from Meds: 5  Family History  Problem Relation Age of Onset   Healthy Sister    Healthy Brother    Social History   Socioeconomic History   Marital status: Married    Spouse name: Not on file   Number of children: Not on file   Years of education: Not on file   Highest education level: Not on file  Occupational History    Not on file  Tobacco Use   Smoking status: Former    Pack years: 0.00   Smokeless tobacco: Never  Vaping Use   Vaping Use: Never used  Substance and Sexual Activity   Alcohol use: Yes    Comment: rare to occasional   Drug use: Never   Sexual activity: Yes  Other Topics Concern   Not on file  Social History Narrative   Not on file   Social Determinants of Health   Financial Resource Strain: Not on file  Food Insecurity: Not on file  Transportation Needs: Not on file  Physical Activity: Not on file  Stress: Not on file  Social Connections: Not on file   Past Surgical History:  Procedure Laterality Date   AMPUTATION Right 08/13/2019   Procedure: AMPUTATION RIGHT FIRST AND SECOND TOES;  Surgeon: Larina Earthly, MD;  [facility];  Service: Vascular;  Laterality: Right;   DIRECT LARYNGOSCOPY Bilateral 04/06/2019   Procedure: MICRO DIRECT LARYNGOSCOPY WITH PROLARYN INJECTION;  Surgeon: Christia Reading, MD;  Location: [facility] OR;  Service: ENT;  Laterality: Bilateral;   IR GASTROSTOMY TUBE MOD SED  03/10/2019   IR GASTROSTOMY TUBE REMOVAL  04/20/2019   WOUND DEBRIDEMENT Left 08/13/2019   Procedure: AMPUTATION  OF LEFT FIRST TOE, AND DEBRIDEMENT OF LEFT SECOND, THIRD AND FOURTH TOES;  Surgeon: Larina Earthly,  MD;  Location: [facility] OR;  Service: Vascular;  Laterality: Left;   Past Surgical History:  Procedure Laterality Date   AMPUTATION Right 08/13/2019   Procedure: AMPUTATION RIGHT FIRST AND SECOND TOES;  Surgeon: Larina Earthly, MD;  Location: [facility] OR;  Service: Vascular;  Laterality: Right;   DIRECT LARYNGOSCOPY Bilateral 04/06/2019   Procedure: MICRO DIRECT LARYNGOSCOPY WITH PROLARYN INJECTION;  Surgeon: Christia Reading, MD;  Location: [facility] OR;  Service: ENT;  Laterality: Bilateral;   IR GASTROSTOMY TUBE MOD SED  03/10/2019   IR GASTROSTOMY TUBE REMOVAL  04/20/2019   WOUND DEBRIDEMENT Left 08/13/2019   Procedure: AMPUTATION  OF LEFT FIRST TOE, AND DEBRIDEMENT OF LEFT SECOND, THIRD AND FOURTH TOES;   Surgeon: Larina Earthly, MD;  Location: [facility] OR;  Service: Vascular;  Laterality: Left;   Past Medical History:  Diagnosis Date   COVID-19 02/06/2019   Diabetes mellitus without complication (HCC)    Hypertension    PE (pulmonary thromboembolism) (HCC)    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   Pneumonia    with Covid   Stroke (HCC)    weakness on right side, occurred while he had Covid   BP (!) 191/105   Pulse 76   Temp 98.4 F (36.9 C)   Ht 5\' 7"  (1.702 m)   Wt 199 lb (90.3 kg)   SpO2 92%   BMI 31.17 kg/m   Opioid Risk Score:   Fall Risk Score:  `1  Depression screen PHQ 2/9  Depression screen [facility] 2/9 04/21/2020 03/06/2020 06/25/2019 06/10/2019 05/13/2019  Decreased Interest 0 0 0 0 0  Down, Depressed, Hopeless 0 0 0 0 0  PHQ - 2 Score 0 0 0 0 0    Review of Systems  Constitutional: Negative.   HENT: Negative.    Eyes: Negative.   Respiratory: Negative.    Cardiovascular: Negative.   Gastrointestinal: Negative.   Endocrine: Negative.   Genitourinary: Negative.   Musculoskeletal:  Positive for arthralgias, back pain, myalgias and neck pain.  Skin: Negative.   Allergic/Immunologic: Negative.   Neurological: Negative.   Hematological: Negative.   Psychiatric/Behavioral: Negative.    All other systems reviewed and are negative.     Objective:   Physical Exam  Awake, alert, appropriate, interpretor in room, NAD Difficulty holding pen with R hand (R handed) or L hand- kept dropping it and writing is impossible to read- also cannot see where line is to write his name,  Missing L 1st toe and R 1st 2 toes- due to previous gangrene- Poor balance with standing- cannot stand with feet together very well- however when closed eyes, was swaying so much, almost fell over.  Cannot touch fingertips to tip of thumb- and VERY slow to try  due to awkward/fine motor incoordination.  MS: Ue's deltoids, biceps, triceps, WE, grip and finger abd 5/5 on LUE;  However  4+/5 on RUE.  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 Original admission 01/26/19- and came to [facility] 03/23/19- discharged- 04/20/19.  Has visual difficulties- double vision with   Needs to see PCP as soon as possible- - due to very elevated BP- 180s-190s/100s- even on BP meds.  Went back to [employer]- per their request and still has he doesn't qualify- is $500- which is ridiculous. Tried both at [employer]- and neither would cover it.  Needs Immigration paperwork filled out. Is 9 pages of paperwork-  Needs to use a cane! to help with balance.  Spent a prolonged period filling out paperwork and discussing what is necessary to fill out these documents- 9 pages worth. Has to be done by 08/02/20 and sent back.  Needs refills- of Cymbalta/Duloxetine (had 11 refills) and Tramadol- had 5 refills- but will be due before next appointment,  Stick to Tramadol 100 mg (2 tabs) 3x/day- not 4x/day- or can cause kidney issues.  "Disability has refused to help him"- needs to find a Clinical research associate.  F/U in 3 months Cannot increase Duloxetine- is on  for depression AND pain.   I spent a total of visit - 37 minutes - more than half spent going over immigration paperwork and nerve pain/depression

## 2020-07-14 NOTE — Telephone Encounter (Signed)
Left voice mail

## 2020-07-18 ENCOUNTER — Other Ambulatory Visit: Payer: Self-pay

## 2020-07-18 ENCOUNTER — Ambulatory Visit: Payer: Medicaid Other | Attending: Nurse Practitioner

## 2020-07-18 VITALS — BP 178/108

## 2020-07-18 DIAGNOSIS — R2681 Unsteadiness on feet: Secondary | ICD-10-CM | POA: Insufficient documentation

## 2020-07-18 DIAGNOSIS — M6281 Muscle weakness (generalized): Secondary | ICD-10-CM | POA: Insufficient documentation

## 2020-07-18 DIAGNOSIS — R2689 Other abnormalities of gait and mobility: Secondary | ICD-10-CM | POA: Insufficient documentation

## 2020-07-18 DIAGNOSIS — M79671 Pain in right foot: Secondary | ICD-10-CM | POA: Diagnosis present

## 2020-07-18 DIAGNOSIS — M79672 Pain in left foot: Secondary | ICD-10-CM | POA: Diagnosis present

## 2020-07-18 NOTE — Therapy (Addendum)
[place], [place], [address]  Phone: [phone]   Fax:  [phone]  PHYSICAL THERAPY UNPLANNED DISCHARGE SUMMARY   Visits from Start of Care: 7  Current functional level related to goals / functional outcomes: Current status unknown   Remaining deficits: Current status unknown   Education / Equipment: Pt has not returned since visit listed below  Patient goals were not assessed. Patient is being discharged due to not returning since the last visit.  (the below note was addended to include the above D/C summary on 08/30/20)   Physical Therapy Treatment  Patient Details  Name: Andrew Bruce MRN: [id number] Date of Birth: [birth year] Referring Provider (PT): Vevelyn Francois, NP   Encounter Date: 07/18/2020   PT End of Session - 07/18/20 1645     Visit Number 7    Number of Visits 13    Date for PT Re-Evaluation 08/03/20    Authorization Type [facility] - eval on 06/21/20    PT Start Time 1650    PT Stop Time 1710    PT Time Calculation (min) 20 min    Activity Tolerance Patient tolerated treatment well    Behavior During Therapy [facility] for tasks assessed/performed             Past Medical History:  Diagnosis Date   COVID-19 02/06/2019   Diabetes mellitus without complication ([place])    Hypertension    PE (pulmonary thromboembolism) ([place])    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   Pneumonia    with Covid   Stroke ([place])    weakness on right side, occurred while he had Covid    Past Surgical History:  Procedure Laterality Date   AMPUTATION Right 08/13/2019   Procedure: AMPUTATION RIGHT FIRST AND SECOND TOES;  Surgeon: Rosetta Posner, MD;  Location: [place];  Service: Vascular;  Laterality: Right;   DIRECT LARYNGOSCOPY Bilateral 04/06/2019   Procedure: MICRO DIRECT LARYNGOSCOPY WITH PROLARYN INJECTION;  Surgeon: Melida Quitter, MD;  Location: [facility] OR;  Service: ENT;  Laterality:  Bilateral;   IR GASTROSTOMY TUBE MOD SED  03/10/2019   IR GASTROSTOMY TUBE REMOVAL  04/20/2019   WOUND DEBRIDEMENT Left 08/13/2019   Procedure: AMPUTATION  OF LEFT FIRST TOE, AND DEBRIDEMENT OF LEFT SECOND, THIRD AND FOURTH TOES;  Surgeon: Rosetta Posner, MD;  [facility];  Service: Vascular;  Laterality: Left;    Vitals:   07/18/20 1646  BP: (!) 178/108     Subjective Assessment - 07/18/20 1646     Subjective Pt presents to PT with continued reports of decrease in foot pain. He had visit with MD last week with BP elevated at this visit.    Patient is accompained by: Interpreter    Currently in Pain? Yes    Pain Score 4     Pain Location Foot    Pain Orientation Right;Left                               [facility] Adult PT Treatment/Exercise - 07/18/20 0001       Self-Care   Self-Care Other Self-Care Comments    Other Self-Care Comments  discussion with patient on dangers of elevated BP, especially with exercise and PT treatment; Review of HEP                    PT Education -  07/18/20 1717     Education Details risk factors associated with elevated BP; HEP    Person(s) Educated Patient    Methods Explanation;Demonstration;Handout    Comprehension Verbalized understanding;Returned demonstration              PT Short Term Goals - 07/18/20 1719       PT SHORT TERM GOAL #1   Title Pt will be compliant and knowledgeable with 90% of HEP in order to improve carryover    Baseline initial HEP given    Time 3    Period Weeks    Status Achieved    Target Date 07/13/20      PT SHORT TERM GOAL #2   Title Pt will self report bilateral foot pain no greater than 5/10 at worst in order to improve comfort and function    Baseline 10/10 at worst    Time 3    Period Weeks    Status Achieved    Target Date 07/13/20               PT Long Term Goals - 06/22/20 1019       PT LONG TERM GOAL #1   Title Pt will self report bilateral foot pain no  greater than 3/10 at worst in order to improve comfort and function    Baseline 10/10 at worst    Time 6    Period Weeks    Status New    Target Date 08/03/20      PT LONG TERM GOAL #2   Title Pt will improve bilateral DF AROM to no less than 12 degrees for improved mobility and decreased pain    Baseline see flowsheet    Time 6    Period Weeks    Status New    Target Date 08/03/20      PT LONG TERM GOAL #3   Title Pt will be compliant and knowledgeable with advanced HEP for continued improvement post discharge    Time 6    Period Weeks    Status New    Target Date 08/03/20                   Plan - 07/18/20 1714     Clinical Impression Statement Patient presented to clinic with continued elevation of BP after recent MD visit on initial and repeat testing today. After discussion on dangers and risk factors associated with higher BP, along with patient PMH of CVA, best practice would be to hold PT services until patient has seen primary care to address elevated BP. He has made progress with PT thus far, with all STGs met and progression towards meeting all LTGs. Once cleared, pt would continue to benefit from skilled PT services for decreasing bilateral foot pain.    PT Treatment/Interventions ADLs/Self Care Home Management;Gait training;Stair training;Functional mobility training;Therapeutic exercise;Therapeutic activities;Balance training;Patient/family education;Passive range of motion;Manual techniques;Energy conservation;Visual/perceptual remediation/compensation;Joint Manipulations;Neuromuscular re-education    PT Next Visit Plan will hold until clearance from primary care regarding BP    PT Home Exercise Plan [id number]    Consulted and Agree with Plan of Care Patient             Patient will benefit from skilled therapeutic intervention in order to improve the following deficits and impairments:  Abnormal gait, Decreased balance, Decreased mobility, Decreased  endurance, Decreased range of motion, Decreased strength, Difficulty walking, Impaired sensation, Pain, Decreased activity tolerance  Visit Diagnosis: Muscle weakness (generalized) - Plan: PT plan  of care cert/re-cert  Pain in left foot - Plan: PT plan of care cert/re-cert  Pain in right foot - Plan: PT plan of care cert/re-cert  Unsteadiness on feet - Plan: PT plan of care cert/re-cert  Other abnormalities of gait and mobility - Plan: PT plan of care cert/re-cert     Problem List Patient Active Problem List   Diagnosis Date Noted   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 07/14/2020   Uncontrolled hypertension 02/18/2020   Impaired functional mobility, balance, gait, and endurance 08/23/2019   Amputation of great toe ([place]) 08/23/2019   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   Impaired ambulation 06/11/2019   Dependent on walker for ambulation 04/30/2019   Right hemiparesis ([place]) 04/30/2019   Tachycardia 04/30/2019   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   Acute blood loss anemia    Uncontrolled type 2 diabetes mellitus with hyperglycemia (HCC)    Dry gangrene (HCC)    Leukocytosis    Embolic stroke ([place]) 96/75/9163   Status post tracheostomy ([place])    Pressure injury of skin 03/02/2019   On mechanically assisted ventilation (HCC)    Goals of care, counseling/discussion    Palliative care encounter    ARDS (adult respiratory distress syndrome) ([place])    Cerebral embolism with cerebral infarction 02/17/2019   Acute respiratory failure with hypoxia (HCC)    Pneumothorax, right     Ward Chatters, PT, DPT 07/18/20 5:21 PM  [facility] [address] [place], [place], [address] Phone: [phone]   Fax:  [phone]  Name: Andrew Bruce MRN: [id number] Date of Birth: 04-Mar-1965

## 2020-07-20 ENCOUNTER — Ambulatory Visit: Payer: Medicaid Other | Admitting: Nurse Practitioner

## 2020-07-20 ENCOUNTER — Other Ambulatory Visit: Payer: Self-pay | Admitting: Nurse Practitioner

## 2020-07-20 ENCOUNTER — Other Ambulatory Visit: Payer: Self-pay

## 2020-07-20 VITALS — BP 191/93 | HR 55

## 2020-07-20 DIAGNOSIS — I10 Essential (primary) hypertension: Secondary | ICD-10-CM

## 2020-07-20 MED ORDER — LISINOPRIL 20 MG PO TABS: 20.0000 mg | ORAL_TABLET | Freq: Every day | ORAL | 11 refills | Status: DC

## 2020-07-20 NOTE — Progress Notes (Signed)
Patient in office today for blood pressure check. Patient stated he last took his Lisinopril 10 mg and Metoprolol 25 mg 1 hour ago.

## 2020-07-20 NOTE — Progress Notes (Signed)
Per Crystal King,NP Lisinopril increased to 20 mg daily. Patient notified verbally agreed and understood. Will scheduled 1 week follow up recheck blood pressure.

## 2020-07-27 ENCOUNTER — Ambulatory Visit: Payer: Medicaid Other | Admitting: Nurse Practitioner

## 2020-07-27 ENCOUNTER — Other Ambulatory Visit: Payer: Self-pay

## 2020-07-27 VITALS — BP 158/98 | HR 65

## 2020-07-27 DIAGNOSIS — I10 Essential (primary) hypertension: Secondary | ICD-10-CM

## 2020-07-27 MED ORDER — LISINOPRIL 40 MG PO TABS: 40.0000 mg | ORAL_TABLET | Freq: Every day | ORAL | 3 refills | Status: DC

## 2020-07-27 NOTE — Patient Instructions (Signed)
Hipertensi?n en los adultos ?Hypertension, Adult ?El t?rmino hipertensi?n es otra forma de denominar a la presi?n arterial elevada. La presi?n arterial elevada fuerza al coraz?n a trabajar m?s para bombear la sangre. Esto puede causar problemas con el paso del tiempo. ?Una lectura de presi?n arterial est? compuesta por 2 n?meros. Hay un n?mero superior (sist?lico) sobre un n?mero inferior (diast?lico). Lo ideal es tener la presi?n arterial por debajo de 120/80. Las elecciones saludables pueden ayudar a bajar la presi?n arterial, o tal vez necesite medicamentos para bajarla. ??Cu?les son las causas? ?Se desconoce la causa de esta afecci?n. Algunas afecciones pueden estar relacionadas con la presi?n arterial alta. ??Qu? incrementa el riesgo? ?Fumar. ?Tener diabetes mellitus tipo 2, colesterol alto, o ambos. ?No hacer la cantidad suficiente de actividad f?sica o ejercicio. ?Tener sobrepeso. ?Consumir mucha grasa, az?car, calor?as o sal (sodio) en su dieta. ?Beber alcohol en exceso. ?Tener una enfermedad renal a largo plazo (cr?nica). ?Tener antecedentes familiares de presi?n arterial alta. ?Edad. Los riesgos aumentan con la edad. ?Raza. El riesgo es mayor para las personas afroamericanas. ?Sexo. Antes de los 45 a?os, los hombres corren m?s riesgo que las mujeres. Despu?s de los 65 a?os, las mujeres corren m?s riesgo que los hombres. ?Tener apnea obstructiva del sue?o. ?Estr?s. ??Cu?les son los signos o los s?ntomas? ?Es posible que la presi?n arterial alta puede no cause s?ntomas. La presi?n arterial muy alta (crisis hipertensiva) puede provocar: ?Dolor de cabeza. ?Sensaciones de preocupaci?n o nerviosismo (ansiedad). ?Falta de aire. ?Hemorragia nasal. ?Sensaci?n de malestar en el est?mago (n?useas). ?V?mitos. ?Cambios en la forma de ver. ?Dolor muy intenso en el pecho. ?Convulsiones. ??C?mo se trata? ?Esta afecci?n se trata haciendo cambios saludables en el estilo de vida, por ejemplo: ?Consumir alimentos  saludables. ?Hacer m?s ejercicio. ?Beber menos alcohol. ?El m?dico puede recetarle medicamentos si los cambios en el estilo de vida no son suficientes para lograr controlar la presi?n arterial y si: ?El n?mero de arriba est? por encima de 130. ?El n?mero de abajo est? por encima de 80. ?Su presi?n arterial personal ideal puede variar. ?Siga estas instrucciones en su casa: ?Comida y bebida ? ?Si se lo dicen, siga el plan de alimentaci?n de DASH (Dietary Approaches to Stop Hypertension, Maneras de alimentarse para detener la hipertensi?n). Para seguir este plan: ?Llene la mitad del plato de cada comida con frutas y verduras. ?Llene un cuarto del plato de cada comida con cereales integrales. Los cereales integrales incluyen pasta integral, arroz integral y pan integral. ?Coma y beba productos l?cteos con bajo contenido de grasa, como leche descremada o yogur bajo en grasas. ?Llene un cuarto del plato de cada comida con prote?nas bajas en grasa (magras). Las prote?nas bajas en grasa incluyen pescado, pollo sin piel, huevos, frijoles y tofu. ?Evite consumir carne grasa, carne curada y procesada, o pollo con piel. ?Evite consumir alimentos prehechos o procesados. ?Consuma menos de 1500 mg de sal por d?a. ?No beba alcohol si: ?El m?dico le indica que no lo haga. ?Est? embarazada, puede estar embarazada o est? tratando de quedar embarazada. ?Si bebe alcohol: ?Limite la cantidad que bebe a lo siguiente: ?De 0 a 1 medida por d?a para las mujeres. ?De 0 a 2 medidas por d?a para los hombres. ?Est? atento a la cantidad de alcohol que hay en las bebidas que toma. En los [place], una medida equivale a una botella de cerveza de 12 oz (355 ml), un vaso de vino de 5 oz (148 ml) o un vaso de una bebida alcoh?lica de   alta graduaci?n de 1? oz (44 ml). ?Estilo de vida ? ?Trabaje con su m?dico para mantenerse en un peso saludable o para perder peso. Preg?ntele a su m?dico cu?l es el peso recomendable para usted. ?Haga al menos 30  minutos de ejercicio la mayor?a de los d?as de la semana. Estos pueden incluir caminar, nadar o andar en bicicleta. ?Realice al menos 30 minutos de ejercicio que fortalezca sus m?sculos (ejercicios de resistencia) al menos 3 d?as a la semana. Estos pueden incluir levantar pesas o hacer [place]. ?No consuma ning?n producto que contenga nicotina o tabaco, como cigarrillos, cigarrillos electr?nicos y tabaco de mascar. Si necesita ayuda para dejar de fumar, consulte al m?dico. ?Controle su presi?n arterial en su casa tal como le indic? el m?dico. ?Concurra a todas las visitas de seguimiento como se lo haya indicado el m?dico. Esto es importante. ?Medicamentos ?Tome los medicamentos de venta libre y los recetados solamente como se lo haya indicado el m?dico. Siga cuidadosamente las indicaciones. ?No omita las dosis de medicamentos para la presi?n arterial. Los medicamentos pierden eficacia si omite dosis. El hecho de omitir las dosis tambi?n aumenta el riesgo de otros problemas. ?Preg?ntele a su m?dico a qu? efectos secundarios o reacciones a los medicamentos debe prestar atenci?n. ?Comun?quese con un m?dico si: ?Piensa que tiene una reacci?n a los medicamentos que est? tomando. ?Tiene dolores de cabeza frecuentes (recurrentes). ?Se siente mareado. ?Tiene hinchaz?n en los tobillos. ?Tiene problemas de visi?n. ?Solicite ayuda inmediatamente si: ?Siente un dolor de cabeza muy intenso. ?Empieza a sentirse desorientado (confundido). ?Se siente d?bil o adormecido. ?Siente que va a desmayarse. ?Tiene un dolor muy intenso en las siguientes zonas: ?Pecho. ?Vientre (abdomen). ?Vomita m?s de una vez. ?Tiene dificultad para respirar. ?Resumen ?El t?rmino hipertensi?n es otra forma de denominar a la presi?n arterial elevada. ?La presi?n arterial elevada fuerza al coraz?n a trabajar m?s para bombear la sangre. ?Para la mayor?a de las personas, una presi?n arterial normal es menor que 120/80. ?Las decisiones saludables pueden ayudarle  a disminuir su presi?n arterial. Si no puede bajar su presi?n arterial mediante decisiones saludables, es posible que deba tomar medicamentos. ?Esta informaci?n no tiene como fin reemplazar el consejo del m?dico. Aseg?rese de hacerle al m?dico cualquier pregunta que tenga. ?Document Revised: 10/16/2017 Document Reviewed: 10/16/2017 ?Elsevier Patient Education ? 2022 Elsevier Inc. ? ?

## 2020-07-27 NOTE — Progress Notes (Signed)
Patient in today for 1 week blood pressure check.

## 2020-08-03 ENCOUNTER — Other Ambulatory Visit: Payer: Self-pay

## 2020-08-03 ENCOUNTER — Other Ambulatory Visit: Payer: Self-pay | Admitting: Nurse Practitioner

## 2020-08-03 ENCOUNTER — Ambulatory Visit (INDEPENDENT_AMBULATORY_CARE_PROVIDER_SITE_OTHER): Payer: Medicaid Other | Admitting: Nurse Practitioner

## 2020-08-03 VITALS — BP 177/95 | HR 92

## 2020-08-03 DIAGNOSIS — E1165 Type 2 diabetes mellitus with hyperglycemia: Secondary | ICD-10-CM | POA: Diagnosis not present

## 2020-08-03 LAB — POCT GLYCOSYLATED HEMOGLOBIN (HGB A1C)
HbA1c POC (<> result, manual entry): 9.9 % (ref 4.0–5.6)
HbA1c, POC (controlled diabetic range): 9.9 % — AB (ref 0.0–7.0)
HbA1c, POC (prediabetic range): 9.9 % — AB (ref 5.7–6.4)
Hemoglobin A1C: 9.9 % — AB (ref 4.0–5.6)

## 2020-08-03 MED ORDER — AMLODIPINE BESYLATE 5 MG PO TABS: 5.0000 mg | ORAL_TABLET | Freq: Every day | ORAL | 3 refills | Status: DC

## 2020-08-03 MED ORDER — ATORVASTATIN CALCIUM 20 MG PO TABS: 20.0000 mg | ORAL_TABLET | Freq: Every day | ORAL | 3 refills | Status: DC

## 2020-08-03 NOTE — Progress Notes (Signed)
Patient in for blood pressure check. Per verbal by Thad Ranger, NP patient had been started on norvasc 5mg . Patient verbally understood. No additional questions.

## 2020-08-03 NOTE — Progress Notes (Signed)
   [facility] [address] [place], [place]  [address] Phone:  [phone]   Fax:  [phone] 

## 2020-08-10 ENCOUNTER — Ambulatory Visit: Payer: Medicaid Other | Admitting: Nurse Practitioner

## 2020-08-10 ENCOUNTER — Other Ambulatory Visit: Payer: Self-pay

## 2020-08-10 VITALS — BP 137/81 | HR 70

## 2020-08-10 DIAGNOSIS — I10 Essential (primary) hypertension: Secondary | ICD-10-CM

## 2020-08-10 NOTE — Progress Notes (Signed)
Patient in for blood pressure check.  

## 2020-08-22 DIAGNOSIS — H538 Other visual disturbances: Secondary | ICD-10-CM | POA: Diagnosis not present

## 2020-08-25 DIAGNOSIS — Z89412 Acquired absence of left great toe: Secondary | ICD-10-CM | POA: Diagnosis not present

## 2020-08-25 DIAGNOSIS — E1142 Type 2 diabetes mellitus with diabetic polyneuropathy: Secondary | ICD-10-CM | POA: Diagnosis not present

## 2020-08-25 DIAGNOSIS — Z89411 Acquired absence of right great toe: Secondary | ICD-10-CM | POA: Diagnosis not present

## 2020-09-20 ENCOUNTER — Other Ambulatory Visit: Payer: Self-pay | Admitting: Physical Medicine and Rehabilitation

## 2020-10-18 ENCOUNTER — Encounter: Payer: Self-pay | Admitting: Nurse Practitioner

## 2020-10-18 ENCOUNTER — Other Ambulatory Visit: Payer: Self-pay

## 2020-10-18 ENCOUNTER — Ambulatory Visit (INDEPENDENT_AMBULATORY_CARE_PROVIDER_SITE_OTHER): Payer: Medicaid Other | Admitting: Nurse Practitioner

## 2020-10-18 VITALS — BP 115/80 | HR 88 | Temp 97.9°F | Ht 66.0 in | Wt 192.2 lb

## 2020-10-18 DIAGNOSIS — H04129 Dry eye syndrome of unspecified lacrimal gland: Secondary | ICD-10-CM

## 2020-10-18 DIAGNOSIS — R7309 Other abnormal glucose: Secondary | ICD-10-CM | POA: Diagnosis not present

## 2020-10-18 DIAGNOSIS — I10 Essential (primary) hypertension: Secondary | ICD-10-CM | POA: Diagnosis not present

## 2020-10-18 DIAGNOSIS — E1165 Type 2 diabetes mellitus with hyperglycemia: Secondary | ICD-10-CM

## 2020-10-18 LAB — POCT URINALYSIS DIP (CLINITEK)
Blood, UA: NEGATIVE
Glucose, UA: 500 mg/dL — AB
Leukocytes, UA: NEGATIVE
Nitrite, UA: NEGATIVE
POC PROTEIN,UA: 100 — AB
Spec Grav, UA: 1.02 (ref 1.010–1.025)
Urobilinogen, UA: 1 E.U./dL
pH, UA: 5.5 (ref 5.0–8.0)

## 2020-10-18 LAB — POCT GLYCOSYLATED HEMOGLOBIN (HGB A1C)
HbA1c POC (<> result, manual entry): 12.3 % (ref 4.0–5.6)
HbA1c, POC (controlled diabetic range): 12.3 % — AB (ref 0.0–7.0)
HbA1c, POC (prediabetic range): 12.3 % — AB (ref 5.7–6.4)
Hemoglobin A1C: 12.3 % — AB (ref 4.0–5.6)

## 2020-10-18 MED ORDER — GLIPIZIDE 5 MG PO TABS: 5.0000 mg | ORAL_TABLET | Freq: Every day | ORAL | 3 refills | Status: DC

## 2020-10-18 MED ORDER — OLOPATADINE HCL 0.1 % OP SOLN: 1.0000 [drp] | Freq: Two times a day (BID) | OPHTHALMIC | 12 refills | Status: DC

## 2020-10-18 MED ORDER — FREESTYLE LIBRE 2 SENSOR MISC: 1.0000 "application " | 11 refills | Status: DC

## 2020-10-18 MED ORDER — METFORMIN HCL 500 MG PO TABS: 500.0000 mg | ORAL_TABLET | Freq: Two times a day (BID) | ORAL | 3 refills | Status: DC

## 2020-10-18 MED ORDER — MEDICAL COMPRESSION SOCKS MISC: 1.0000 "application " | Freq: Two times a day (BID) | 0 refills | Status: AC

## 2020-10-18 MED ORDER — FREESTYLE LIBRE 2 READER DEVI: 1.0000 | 1 refills | Status: DC

## 2020-10-18 NOTE — Progress Notes (Signed)
[place], [place]  [address] Phone:  [phone]   Fax:  [phone]   Established Patient Office Visit  Subjective:  Patient ID: Andrew Bruce, male    DOB: April 05, 1965  Age: 55 y.o. MRN: [id number]  CC:  Chief Complaint  Patient presents with   Follow-up    Pt is here today for his follow up visit for diabetes and hypertension. Pt states that he has been having swelling in both feet when he is on them for a long period of time.    HPI Andrew Bruce presents for follow up. He  has a past medical history of COVID-19 (02/06/2019), Diabetes mellitus without complication ([place]), Hypertension, PE (pulmonary thromboembolism) ([place]), Pneumonia, and Stroke ([place]).   He is in today for follow up. He has diabetes and HTN. His BP is stable due to adjustments in his medication. He is currently on Glipizide and Metformin. He does monitor CBG at home range 127 to 140's. He has had a 7 pounds weight loss.  However his current A1c is 12.3%. He has been monitoring his blood pressure at home which has been within normal range after several adjustments with his medication. He has been having some swelling in his ankle. He feels this is worse in the morning.   Denies headache, dizziness, visual changes, shortness of breath, dyspnea on exertion, chest pain, nausea, vomiting or any edema.   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   Pneumonia    with Covid   Stroke (HCC)    weakness on right side, occurred while he had Covid    Past Surgical History:  Procedure Laterality Date   AMPUTATION Right 08/13/2019   Procedure: AMPUTATION RIGHT FIRST AND SECOND TOES;  Surgeon: Rosetta Posner, MD;  Location: [facility] OR;  Service: Vascular;  Laterality: Right;   DIRECT LARYNGOSCOPY  Bilateral 04/06/2019   Procedure: MICRO DIRECT LARYNGOSCOPY WITH PROLARYN INJECTION;  Surgeon: Melida Quitter, MD;  [facility];  Service: ENT;  Laterality: Bilateral;   IR GASTROSTOMY TUBE MOD SED  03/10/2019   IR GASTROSTOMY TUBE REMOVAL  04/20/2019   WOUND DEBRIDEMENT Left 08/13/2019   Procedure: AMPUTATION  OF LEFT FIRST TOE, AND DEBRIDEMENT OF LEFT SECOND, THIRD AND FOURTH TOES;  Surgeon: Rosetta Posner, MD;  Location: [facility] OR;  Service: Vascular;  Laterality: Left;    Family History  Problem Relation Age of Onset   Healthy Sister    Healthy Brother     Social History   Socioeconomic History   Marital status: Married    Spouse name: Not on file   Number of children: Not on file   Years of education: Not on file   Highest education level: Not on file  Occupational History   Not on file  Tobacco Use   Smoking status: Former   Smokeless tobacco: Never  Vaping Use   Vaping Use: Never used  Substance and Sexual Activity   Alcohol use: Yes    Comment: rare to occasional   Drug use: Never   Sexual activity: Yes    Birth control/protection: None  Other Topics Concern   Not on file  Social History Narrative   Not on file   Social Determinants of Radio broadcast assistant  Strain: Not on file  Food Insecurity: Not on file  Transportation Needs: Not on file  Physical Activity: Not on file  Stress: Not on file  Social Connections: Not on file  Intimate Partner Violence: Not on file    Outpatient Medications Prior to Visit  Medication Sig Dispense Refill   amLODipine (NORVASC) 5 MG tablet Take 1 tablet (5 mg total) by mouth daily. 90 tablet 3   atorvastatin (LIPITOR) 20 MG tablet Take 1 tablet (20 mg total) by mouth daily at 6 PM. 90 tablet 3   blood glucose meter kit and supplies KIT Dispense based on patient and insurance preference. Use up to four times daily as directed. (FOR ICD-9 250.00, 250.01). 1 each 0   carboxymethylcellulose 1 % ophthalmic solution Apply 1 drop  to eye 3 (three) times daily. 30 mL 0   dextromethorphan-guaiFENesin (MUCINEX DM) 30-600 MG 12hr tablet Take 1 tablet by mouth 2 (two) times daily. 30 tablet 0   DULoxetine (CYMBALTA) 60 MG capsule Take 1 capsule (60 mg total) by mouth daily. For nerve pain 30 capsule 11   lisinopril (ZESTRIL) 40 MG tablet Take 1 tablet (40 mg total) by mouth daily. 90 tablet 3   metoprolol tartrate (LOPRESSOR) 25 MG tablet Take 1 tablet (25 mg total) by mouth 2 (two) times daily. For blood pressure 60 tablet 5   traMADol (ULTRAM) 50 MG tablet Take 2 tablets (100 mg total) by mouth 3 (three) times daily as needed for moderate pain or severe pain. 180 tablet 5   glipiZIDE (GLUCOTROL) 5 MG tablet Take 0.5 tablets (2.5 mg total) by mouth daily before breakfast. 15 tablet 5   metFORMIN (GLUCOPHAGE) 500 MG tablet Take 1 tablet (500 mg total) by mouth 2 (two) times daily with a meal. 60 tablet 5   No facility-administered medications prior to visit.    No Known Allergies  ROS Review of Systems    Objective:    Physical Exam HENT:     Head: Normocephalic.     Nose: Nose normal.     Mouth/Throat:     Mouth: Mucous membranes are moist.     Comments: hoarseness Cardiovascular:     Rate and Rhythm: Normal rate and regular rhythm.     Pulses: Normal pulses.     Heart sounds: Normal heart sounds.  Pulmonary:     Effort: Pulmonary effort is normal.     Breath sounds: Normal breath sounds.  Abdominal:     Palpations: Abdomen is soft.  Musculoskeletal:     Right lower leg: No edema.     Left lower leg: No edema.  Skin:    General: Skin is warm and dry.     Capillary Refill: Capillary refill takes less than 2 seconds.     Findings: No rash.  Neurological:     General: No focal deficit present.     Mental Status: He is alert and oriented to person, place, and time.  Psychiatric:        Mood and Affect: Mood normal.        Behavior: Behavior normal.        Thought Content: Thought content normal.         Judgment: Judgment normal.    BP 115/80   Pulse 88   Temp 97.9 F (36.6 C)   Ht _0  (1.676 m)   Wt 192 lb 3.2 oz (87.2 kg)   SpO2 98%   BMI 31.02 kg/m  Wt Readings from  Last 3 Encounters:  10/18/20 192 lb 3.2 oz (87.2 kg)  07/14/20 199 lb (90.3 kg)  07/06/20 199 lb 14.4 oz (90.7 kg)     Health Maintenance Due  Topic Date Due   COVID-19 Vaccine (1) Never done   FOOT EXAM  Never done   OPHTHALMOLOGY EXAM  Never done   Zoster Vaccines- Shingrix (1 of 2) Never done   INFLUENZA VACCINE  08/14/2020    There are no preventive care reminders to display for this patient.  Lab Results  Component Value Date   TSH 4.156 03/21/2019   Lab Results  Component Value Date   WBC 6.7 03/06/2020   HGB 14.7 03/06/2020   HCT 43.5 03/06/2020   MCV 85 03/06/2020   PLT 314 03/06/2020   Lab Results  Component Value Date   NA 140 03/06/2020   K 4.3 03/06/2020   CO2 22 08/13/2019   GLUCOSE 90 03/06/2020   BUN 15 03/06/2020   CREATININE 0.77 03/06/2020   BILITOT 0.6 03/06/2020   ALKPHOS 81 03/06/2020   AST 16 03/06/2020   ALT 15 08/13/2019   PROT 7.4 03/06/2020   ALBUMIN 4.7 03/06/2020   CALCIUM 9.5 03/06/2020   ANIONGAP 12 08/13/2019   Lab Results  Component Value Date   CHOL 134 02/17/2019   Lab Results  Component Value Date   HDL 33 (L) 02/17/2019   Lab Results  Component Value Date   LDLCALC 75 02/17/2019   Lab Results  Component Value Date   TRIG 551 (H) 03/01/2019   Lab Results  Component Value Date   CHOLHDL 4.1 02/17/2019   Lab Results  Component Value Date   HGBA1C 12.3 (A) 10/18/2020   HGBA1C 12.3 10/18/2020   HGBA1C 12.3 (A) 10/18/2020   HGBA1C 12.3 (A) 10/18/2020      Assessment & Plan:   Problem List Items Addressed This Visit       Endocrine   Uncontrolled type 2 diabetes mellitus with hyperglycemia ([place]) - Primary Worsening Concerned about patient's compliance however will not make adjustments to medication based on what he reports  as his regular CBG We will have patient continue to monitor and bring in meter 2 to 4 weeks for evaluation   Relevant Medications   glipiZIDE (GLUCOTROL) 5 MG tablet   metFORMIN (GLUCOPHAGE) 500 MG tablet   Other Relevant Orders   HgB A1c (Completed)   POCT URINALYSIS DIP (CLINITEK) (Completed)   Other Visit Diagnoses     Hypertension, unspecified type     Stable Encouraged on going compliance with current medication regimen Encouraged home monitoring and recording BP <130/80 Eating a heart-healthy diet with less salt Encouraged regular physical activity  Recommend Weight loss      Dry eye     Persistent [place] 0.1%   Hemoglobin A1C greater than 9%, indicating poor diabetic control       Relevant Medications   glipiZIDE (GLUCOTROL) 5 MG tablet   metFORMIN (GLUCOPHAGE) 500 MG tablet       Meds ordered this encounter  Medications   Continuous Blood Gluc Sensor (FREESTYLE LIBRE 2 SENSOR) MISC    Sig: 1 application by Does not apply route every 14 (fourteen) days.    Dispense:  2 each    Refill:  11    Order Specific Question:   Supervising Provider    Answer:   Tresa Garter [[id number]]   Continuous Blood Gluc Receiver (FREESTYLE LIBRE 2 READER) DEVI    Sig: 1 Device by  Does not apply route every 14 (fourteen) days.    Dispense:  1 each    Refill:  1    Order Specific Question:   Supervising Provider    Answer:   Tresa Garter [[id number]]   olopatadine (PATADAY) 0.1 % ophthalmic solution    Sig: Place 1 drop into both eyes 2 (two) times daily.    Dispense:  5 mL    Refill:  12    Order Specific Question:   Supervising Provider    Answer:   Tresa Garter [[id number]]   Elastic Bandages & Supports (MEDICAL COMPRESSION SOCKS) MISC    Sig: 1 application by Does not apply route every 12 (twelve) hours.    Dispense:  1 each    Refill:  0    Order Specific Question:   Supervising Provider    Answer:   Tresa Garter [[id number]]   glipiZIDE  (GLUCOTROL) 5 MG tablet    Sig: Take 1 tablet (5 mg total) by mouth daily before breakfast.    Dispense:  90 tablet    Refill:  3    Order Specific Question:   Supervising Provider    Answer:   Tresa Garter [[id number]]   metFORMIN (GLUCOPHAGE) 500 MG tablet    Sig: Take 1 tablet (500 mg total) by mouth 2 (two) times daily with a meal.    Dispense:  180 tablet    Refill:  3    Order Specific Question:   Supervising Provider    Answer:   Tresa Garter [[id number]]    Follow-up: Return in about 4 weeks (around 11/15/2020) for follow up [id number].    Vevelyn Francois, NP

## 2020-10-18 NOTE — Patient Instructions (Signed)
Control de la glucemia, en adultos Blood Glucose Monitoring, Adult Controlar el nivel de azcar en la sangre (glucosa) es una parte importante del tratamiento de la diabetes. El control de la glucemia implica realizar controles regulares como se lo hayan indicado, y Catering manager un registro o diario de los [place] a lo largo del [place]. Controlar la glucemia en forma peridica y llevar un registro diario puede: Ayudarlos a usted y al mdico a Programmer, applications de control de la diabetes segn sea necesario, lo que incluye medicamentos o insulina. Ayudarlo a usted a comprender de [employer], la actividad fsica, las enfermedades y los medicamentos inciden en la glucemia. Permitirle a usted saber cul es su nivel de glucemia en cualquier momento. Averiguar rpidamente si su nivel de glucemia es bajo (hipoglucemia) o alto (hiperglucemia). El [employer] objetivos personalizados de su tratamiento. Los objetivos estarn basados en su edad, en otras afecciones que tenga y en cmo responde al tratamiento de la diabetes. Generalmente, el objetivo del tratamiento es [employer] siguientes niveles de glucemia: Antes de las comidas (preprandial): de 80 a 130 mg/dl (de 4.4 a 7.2 mmol/l). Despus de las comidas (posprandial): por debajo de 180 mg/dl (10 mmol/l). Nivel de A1c: menos del 7 %. Materiales necesarios: Medidor de glucemia. Tiras reactivas para el medidor. Cada medidor tiene sus propias tiras reactivas. Debe usar las tiras reactivas que trajo su medidor. Una aguja para pincharse el dedo (lanceta). No use una lanceta ms de una vez. Un dispositivo que sujeta la lanceta (dispositivo de puncin). Un diario o cuaderno de anotaciones para Pensions consultant. Cmo controlar su glucemia Controlarse la glucemia  [employer] durante al menos 20 segundos con agua y [place]. Pnchese el costado del dedo (no en la punta) con la lanceta. No use el mismo dedo de forma  consecutiva. Frote suavemente el dedo [employer] aparezca una pequea gota de [place]. Siga las instrucciones que vienen con el medidor para Garment/textile technologist tira Comptroller, Midwife la sangre sobre la tira y usar el medidor de glucemia. Anote su resultado y las observaciones que desee en su registro. Usar sitios alternativos Algunos medidores le permiten tomar sangre para la prueba de otras zonas del cuerpo que no son el dedo (sitios alternativos). Los sitios alternativos ms frecuentes son [place], el muslo y la palma de la mano. Es posible que los sitios alternativos no sean tan precisos como los dedos porque el flujo de sangre es ms lento en esas zonas. Esto significa que el resultado que obtiene de estas zonas puede estar retrasado y ser un poco diferente del resultado que obtendra de su dedo. Use el dedo solamente, y no utilice sitios alternativos si: Cree que tiene hipoglucemia. A veces no sabe que su nivel de glucemia est bajando (hipoglucemia asintomtica). Recomendaciones y consejos generales Registro diario de glucemia  Cada vez que controle su nivel de glucemia, anote el resultado. Tambin anote aquellos factores que puedan estar afectando su nivel de glucemia, como la dieta y la actividad fsica Designer, multimedia. Esta informacin puede ayudarlos a usted y al pediatra a: Charity fundraiser patrones en su glucemia durante el transcurso del [place]. Ajustar su plan de control de la diabetes segn sea necesario. Averige si su medidor le permite descargar sus registros en una computadora o si hay una aplicacin para Nature conservation officer. [employer] de los medidores de glucosa guardan un registro de las lecturas realizadas con el medidor. Si usted tiene diabetes tipo 1: Contrlese la  glucemia 4 o ms veces por da si recibe tratamiento intensivo con insulina con mltiples inyecciones diarias o si [place] una bomba de insulina. Controle su nivel de glucemia: Antes de cada comida y colacin. Antes de ir a  acostarse. Tambin controle su nivel de glucemia: Si tiene sntomas de hipoglucemia. Despus de tratar un nivel bajo de glucemia. Antes de realizar [employer] generan un riesgo de lesiones, como conducir o usar [place]. Antes y despus de hacer ejercicio. Dos horas despus de una comida. Ocasionalmente, entre las 2:00 a. m. y las 3:00 a. m., como se lo hayan indicado. Es posible que deba controlarse la glucemia con ms frecuencia, de 6 a 10 veces por da, si: Tiene diabetes que no est bien controlada. Est enfermo. Tiene antecedentes de hipoglucemia grave. Tiene hipoglucemia asintomtica. Si usted tiene diabetes tipo 2: Contrlese la glucemia 2 o ms veces al da si se aplica insulina u otros medicamentos para la diabetes. Contrlese la glucemia 4 o ms veces al da si recibe tratamiento intensivo con insulina. Ocasionalmente, tambin es posible que deba controlarse la glucosa entre las 2:00 a. m. y las 3:00 a. m., como se lo hayan indicado. Tambin controle su nivel de glucemia: Antes y despus de hacer ejercicio. Antes de realizar [employer] generan un riesgo de lesiones, como conducir o usar [place]. Es posible que Geophysicist/field seismologist con ms frecuencia su nivel de glucemia si: Necesita ajustar la dosis de sus medicamentos. Su diabetes no est bien controlada. Est enfermo. Consejos generales Asegrese de tener siempre a mano sus suministros. Despus de usar algunas cajas de tiras reactivas, ajuste (calibre) el medidor de glucemia segn las instrucciones del medidor. Todos los medidores de glucemia incluyen un nmero de telfono directo, disponible las 24 horas, al que podr llamar si tiene preguntas o [place]. Adems, comunquese con su mdico si tiene alguna pregunta o inquietud. Dnde buscar ms informacin American Diabetes Association (Asociacin Estadounidense de la Diabetes): www.diabetes.org The Association of Diabetes Care & Education Specialists  (Asociacin de Especialistas en Atencin y Educacin sobre la Diabetes): www.diabeteseducator.org Comunquese con un mdico si: El nivel de glucemia es mayor o igual que 240 mg/dl (13.3 mmol/dl) durante 2 das seguidos. Ha estado enfermo o ha tenido fiebre durante 2 das o ms, y no [place]. Tiene alguno de los siguientes problemas durante ms de 6 horas: No puede comer ni beber. Tiene nuseas o vmitos. Tiene diarrea. Solicite ayuda de inmediato si: Su nivel de glucemia est por debajo de 54 mg/dl (3 mmol/l). Siente confusin o tiene dificultad para pensar con claridad. Tiene dificultad para respirar. Tiene un nivel moderado o alto de cetonas en la [place]. Estos sntomas pueden representar un problema grave que constituye Engineer, maintenance (IT). No espere a ver si los sntomas desaparecen. Solicite atencin mdica de inmediato. Comunquese con el servicio de emergencias de su localidad (911 en los [place]). No conduzca por sus propios medios [employer]. Resumen Controlar su glucemia es una parte importante del tratamiento de su diabetes. El control de la glucemia implica realizar controles regulares como se lo hayan indicado, y Catering manager un registro o diario de los [place] a lo largo del [place]. El [employer] objetivos personalizados de su tratamiento. Los objetivos estarn basados en su edad, en otras afecciones que tenga y en cmo responde al tratamiento de la diabetes. Cada vez que controle su nivel de glucemia, anote el resultado. Tambin anote aquellos factores que puedan estar afectando su glucemia, como la dieta y la [place]  fsica Dietitian. Esta informacin no tiene Theme park manager el consejo del mdico. Asegrese de hacerle al mdico cualquier pregunta que tenga. Document Revised: 10/26/2019 Document Reviewed: 10/26/2019 Elsevier Patient Education  2022 [employer].

## 2020-10-20 ENCOUNTER — Encounter: Payer: Self-pay | Admitting: Physical Medicine and Rehabilitation

## 2020-10-20 ENCOUNTER — Encounter
Payer: Medicaid Other | Attending: Physical Medicine and Rehabilitation | Admitting: Physical Medicine and Rehabilitation

## 2020-10-20 ENCOUNTER — Other Ambulatory Visit: Payer: Self-pay

## 2020-10-20 ENCOUNTER — Other Ambulatory Visit: Payer: Self-pay | Admitting: Physical Medicine and Rehabilitation

## 2020-10-20 VITALS — BP 147/97 | HR 84 | Temp 98.9°F | Ht 68.0 in | Wt 196.0 lb

## 2020-10-20 DIAGNOSIS — R2689 Other abnormalities of gait and mobility: Secondary | ICD-10-CM | POA: Diagnosis not present

## 2020-10-20 DIAGNOSIS — I69398 Other sequelae of cerebral infarction: Secondary | ICD-10-CM

## 2020-10-20 DIAGNOSIS — G8191 Hemiplegia, unspecified affecting right dominant side: Secondary | ICD-10-CM | POA: Diagnosis not present

## 2020-10-20 DIAGNOSIS — R262 Difficulty in walking, not elsewhere classified: Secondary | ICD-10-CM | POA: Diagnosis not present

## 2020-10-20 DIAGNOSIS — H532 Diplopia: Secondary | ICD-10-CM | POA: Diagnosis not present

## 2020-10-20 MED ORDER — TRAMADOL HCL 50 MG PO TABS: 100.0000 mg | ORAL_TABLET | Freq: Three times a day (TID) | ORAL | 5 refills | Status: DC | PRN

## 2020-10-20 NOTE — Progress Notes (Signed)
Subjective:    Patient ID: Andrew Bruce, male    DOB: 1965-08-11, 55 y.o.   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 Original admission 01/26/19- and came to CIR 03/23/19- discharged- 04/20/19.  Has visual difficulties- double vision due to stroke.  Here for f/u on stroke, etc as above.   Tramadol- 100 mg in AM and afternoon - but not taking at night since causes itching-   Causing itching only at night- and went away when not taking at night.   A lot of weakness- in vision-  Likes to be with eyes closed as much as possible.   Also feeling weaker when does things- feels like he's lost his breath- saw PCP last week- they told him "it takes time to heal".   After COVID, has a lot of pain- and pressure on his skin and had no pain prior to COVID. Very hard for him.       Fingers are numb- have been numb.  No change.       Pain Inventory Average Pain 8 Pain Right Now 8 My pain is burning, tingling, and aching  In the last 24 hours, has pain interfered with the following? General activity 10 Relation with others 10 Enjoyment of life 10 What TIME of day is your pain at its worst? morning  and night Sleep (in general) Poor  Pain is worse with: sitting and inactivity Pain improves with: heat/ice, therapy/exercise, and medication Relief from Meds: 5  Family History  Problem Relation Age of Onset   Healthy Sister    Healthy Brother    Social History   Socioeconomic History   Marital status: Married    Spouse name: Not on file   Number of children: Not on file   Years of  education: Not on file   Highest education level: Not on file  Occupational History   Not on file  Tobacco Use   Smoking status: Former   Smokeless tobacco: Never  Vaping Use   Vaping Use: Never used  Substance and Sexual Activity   Alcohol use: Yes    Comment: rare to occasional   Drug use: Never   Sexual activity: Yes    Birth control/protection: None  Other Topics Concern   Not on file  Social History Narrative   Not on file   Social Determinants of Health   Financial Resource Strain: Not on file  Food Insecurity: Not on file  Transportation Needs: Not on file  Physical Activity: Not on file  Stress: Not on file  Social Connections: Not on file   Past Surgical History:  Procedure Laterality Date   AMPUTATION Right 08/13/2019   Procedure: AMPUTATION RIGHT FIRST AND SECOND TOES;  Surgeon: Larina Earthly, MD;  Location: [facility] OR;  Service: Vascular;  Laterality: Right;   DIRECT LARYNGOSCOPY Bilateral 04/06/2019   Procedure: MICRO DIRECT LARYNGOSCOPY WITH PROLARYN INJECTION;  Surgeon: Christia Reading, MD;  Location: [facility] OR;  Service: ENT;  Laterality: Bilateral;   IR GASTROSTOMY TUBE MOD SED  03/10/2019   IR  GASTROSTOMY TUBE REMOVAL  04/20/2019   WOUND DEBRIDEMENT Left 08/13/2019   Procedure: AMPUTATION  OF LEFT FIRST TOE, AND DEBRIDEMENT OF LEFT SECOND, THIRD AND FOURTH TOES;  Surgeon: Larina Earthly, MD;  Location: [facility] OR;  Service: Vascular;  Laterality: Left;   Past Surgical History:  Procedure Laterality Date   AMPUTATION Right 08/13/2019   Procedure: AMPUTATION RIGHT FIRST AND SECOND TOES;  Surgeon: Larina Earthly, MD;  Location: [facility] OR;  Service: Vascular;  Laterality: Right;   DIRECT LARYNGOSCOPY Bilateral 04/06/2019   Procedure: MICRO DIRECT LARYNGOSCOPY WITH PROLARYN INJECTION;  Surgeon: Christia Reading, MD;  Location: [facility] OR;  Service: ENT;  Laterality: Bilateral;   IR GASTROSTOMY TUBE MOD SED  03/10/2019   IR GASTROSTOMY TUBE REMOVAL  04/20/2019   WOUND DEBRIDEMENT Left 08/13/2019    Procedure: AMPUTATION  OF LEFT FIRST TOE, AND DEBRIDEMENT OF LEFT SECOND, THIRD AND FOURTH TOES;  Surgeon: Larina Earthly, MD;  Location: [facility] OR;  Service: Vascular;  Laterality: Left;   Past Medical History:  Diagnosis Date   COVID-19 02/06/2019   Diabetes mellitus without complication (HCC)    Hypertension    PE (pulmonary thromboembolism) (HCC)    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   Pneumonia    with Covid   Stroke (HCC)    weakness on right side, occurred while he had Covid   BP (!) 147/97   Pulse 84   Temp 98.9 F (37.2 C) (Oral)   Ht 5\' 8"  (1.727 m)   Wt 196 lb (88.9 kg)   SpO2 95%   BMI 29.80 kg/m   Opioid Risk Score:   Fall Risk Score:  `1  Depression screen PHQ 2/9  Depression screen [facility] 2/9 10/18/2020 04/21/2020 03/06/2020 06/25/2019 06/10/2019 05/13/2019  Decreased Interest 0 0 0 0 0 0  Down, Depressed, Hopeless 0 0 0 0 0 0  PHQ - 2 Score 0 0 0 0 0 0     Review of Systems  Musculoskeletal:        Calf and feet pain  Neurological:  Positive for tremors and weakness.  All other systems reviewed and are negative.     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 Original admission 01/26/19- and came to CIR 03/23/19- discharged-  04/20/19.  Has visual difficulties- double vision due to stroke.  Here for f/u on stroke, etc as above.   Tramadol 100 mg TID as needed- #180- with 5 refills- pt only getting 1 week at a time- I have prescribed for 1 MONTH at a time- not 1 week at a time- and switch pharmacies if they won't give 1  month at a time.   2.  Went over that he's disabled and needs disability- since he's not going to get much better from where he is and unable to work with double vision and continued weakness, which puts him at high risk for falls. - needs to use his cane still advised pt.    3. Con't Duloxetine and Tramadol- con't meds- call when needs refills.    4. Did get disability lawyer.    5. BP 147/97 not great, but better- con't with PCP.    6. Is taking meds for Diabetes now. Per PCP.   7. F/U  in 3 months- double appointment due to interpretor/complicated medical issues.   I spent a total of 25 minutes on visit- as detailed above.

## 2020-10-20 NOTE — Patient Instructions (Signed)
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 Original admission 01/26/19- and came to CIR 03/23/19- discharged- 04/20/19.  Has visual difficulties- double vision due to stroke.  Here for f/u on stroke, etc as above.   Tramadol 100 mg TID as needed- #180- with 5 refills- pt only getting 1 week at a time- I have prescribed for 1 MONTH at a time- not 1 week at a time- and switch pharmacies if they won't give 1 month at a time.   2.  Went over that he's disabled and needs disability- since he's not going to get much better from where he is and unable to work with double vision and continued weakness, which puts him at high risk for falls. - needs to use his cane still advised pt.    3. Con't Duloxetine and Tramadol- con't meds- call when needs refills.    4. Did get disability lawyer.    5. BP 147/97 not great, but better- con't with PCP.    6. Is taking meds for Diabetes now. Per PCP.   7. F/U  in 3 months- double appointment due to interpretor/complicated medical issues.

## 2020-10-24 ENCOUNTER — Telehealth: Payer: Self-pay

## 2020-10-24 NOTE — Telephone Encounter (Signed)
Submitted PA for Tramadol

## 2020-10-25 NOTE — Telephone Encounter (Signed)
Approved 10/24/20-04/24/21

## 2020-11-17 ENCOUNTER — Other Ambulatory Visit: Payer: Self-pay

## 2020-11-17 ENCOUNTER — Encounter: Payer: Self-pay | Admitting: Nurse Practitioner

## 2020-11-17 ENCOUNTER — Ambulatory Visit (INDEPENDENT_AMBULATORY_CARE_PROVIDER_SITE_OTHER): Payer: Medicaid Other | Admitting: Nurse Practitioner

## 2020-11-17 VITALS — BP 162/96 | HR 76 | Temp 81.0°F | Ht 66.0 in | Wt 198.0 lb

## 2020-11-17 DIAGNOSIS — Z23 Encounter for immunization: Secondary | ICD-10-CM

## 2020-11-17 DIAGNOSIS — I10 Essential (primary) hypertension: Secondary | ICD-10-CM | POA: Diagnosis not present

## 2020-11-17 DIAGNOSIS — E1165 Type 2 diabetes mellitus with hyperglycemia: Secondary | ICD-10-CM | POA: Diagnosis not present

## 2020-11-17 MED ORDER — GLIPIZIDE 5 MG PO TABS: 5.0000 mg | ORAL_TABLET | Freq: Two times a day (BID) | ORAL | 3 refills | Status: DC

## 2020-11-17 MED ORDER — METFORMIN HCL 500 MG PO TABS: 500.0000 mg | ORAL_TABLET | Freq: Two times a day (BID) | ORAL | 3 refills | Status: DC

## 2020-11-17 NOTE — Progress Notes (Signed)
[name], [place]  [id number] Phone:  [phone]   Fax:  [phone]   Established Patient Office Visit  Subjective:  Patient ID: Andrew Bruce, male    DOB: 1965/09/13  Age: 55 y.o. MRN: [id number]  CC:  Chief Complaint  Patient presents with   Follow-up    4 week follow up; elevated blood sugars 200, earlier today is was 326 1 hour after eating some beans     HPI Andrew Bruce presents for follow up. He  has a past medical history of COVID-19 (02/06/2019), Diabetes mellitus without complication ([place]), Hypertension, PE (pulmonary thromboembolism) ([place]), Pneumonia, and Stroke ([place]).   He is following up for his DM . He is prescribed metformin 500 mg daily and glipizide 5 mg daily.  He is CBG is elevated 200-300. He reports itching with one of his medications possibly tramadol. He decided to take this the metformin and glipizide once a day to see which was causing the itch.  His BP is elevated today. He reports being compliant with his amlodipine 5 mg and lisinopril 40 mg. His home BP ranges 130-140/70-80. Denies headache, dizziness, visual changes, shortness of breath, dyspnea on exertion, chest pain, nausea, vomiting or any edema.   Past Medical History:  Diagnosis Date   COVID-19 02/06/2019   Diabetes mellitus without complication (HCC)    Hypertension    PE (pulmonary thromboembolism) ([place])    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   Pneumonia    with Covid   Stroke (HCC)    weakness on right side, occurred while he had Covid    Past Surgical History:  Procedure Laterality Date   AMPUTATION Right 08/13/2019   Procedure: AMPUTATION RIGHT FIRST AND SECOND TOES;  Surgeon: Rosetta Posner, MD;  Location: [facility] OR;  Service: Vascular;  Laterality: Right;   DIRECT LARYNGOSCOPY Bilateral 04/06/2019   Procedure: MICRO DIRECT LARYNGOSCOPY WITH PROLARYN INJECTION;  Surgeon: Melida Quitter, MD;  Location: [facility] OR;   Service: ENT;  Laterality: Bilateral;   IR GASTROSTOMY TUBE MOD SED  03/10/2019   IR GASTROSTOMY TUBE REMOVAL  04/20/2019   WOUND DEBRIDEMENT Left 08/13/2019   Procedure: AMPUTATION  OF LEFT FIRST TOE, AND DEBRIDEMENT OF LEFT SECOND, THIRD AND FOURTH TOES;  Surgeon: Rosetta Posner, MD;  Location: [facility] OR;  Service: Vascular;  Laterality: Left;    Family History  Problem Relation Age of Onset   Healthy Sister    Healthy Brother     Social History   Socioeconomic History   Marital status: Married    Spouse name: Not on file   Number of children: Not on file   Years of education: Not on file   Highest education level: Not on file  Occupational History   Not on file  Tobacco Use   Smoking status: Former   Smokeless tobacco: Never  Vaping Use   Vaping Use: Never used  Substance and Sexual Activity   Alcohol use: Yes    Comment: rare to occasional   Drug use: Never   Sexual activity: Yes    Birth control/protection: None  Other Topics Concern   Not on file  Social History Narrative   Not on file   Social Determinants of Health   Financial Resource Strain: Not on file  Food Insecurity: Not on file  Transportation Needs: Not on file  Physical Activity: Not on file  Stress: Not on file  Social  Connections: Not on file  Intimate Partner Violence: Not on file    Outpatient Medications Prior to Visit  Medication Sig Dispense Refill   amLODipine (NORVASC) 5 MG tablet Take 1 tablet (5 mg total) by mouth daily. 90 tablet 3   atorvastatin (LIPITOR) 20 MG tablet Take 1 tablet (20 mg total) by mouth daily at 6 PM. 90 tablet 3   blood glucose meter kit and supplies KIT Dispense based on patient and insurance preference. Use up to four times daily as directed. (FOR ICD-9 250.00, 250.01). 1 each 0   carboxymethylcellulose 1 % ophthalmic solution Apply 1 drop to eye 3 (three) times daily. 30 mL 0   Continuous Blood Gluc Receiver (FREESTYLE LIBRE 2 READER) DEVI 1 Device by Does not apply  route every 14 (fourteen) days. 1 each 1   Continuous Blood Gluc Sensor (FREESTYLE LIBRE 2 SENSOR) MISC 1 application by Does not apply route every 14 (fourteen) days. 2 each 11   dextromethorphan-guaiFENesin (MUCINEX DM) 30-600 MG 12hr tablet Take 1 tablet by mouth 2 (two) times daily. 30 tablet 0   DULoxetine (CYMBALTA) 60 MG capsule Take 1 capsule (60 mg total) by mouth daily. For nerve pain 30 capsule 11   Elastic Bandages & Supports (MEDICAL COMPRESSION SOCKS) MISC 1 application by Does not apply route every 12 (twelve) hours. 1 each 0   lisinopril (ZESTRIL) 40 MG tablet Take 1 tablet (40 mg total) by mouth daily. 90 tablet 3   metoprolol tartrate (LOPRESSOR) 25 MG tablet Take 1 tablet (25 mg total) by mouth 2 (two) times daily. For blood pressure 60 tablet 5   olopatadine (PATADAY) 0.1 % ophthalmic solution Place 1 drop into both eyes 2 (two) times daily. 5 mL 12   traMADol (ULTRAM) 50 MG tablet Take 2 tablets (100 mg total) by mouth 3 (three) times daily as needed for moderate pain or severe pain. Please give 1 month at a time- not 1 week! It's prescribed! 180 tablet 5   glipiZIDE (GLUCOTROL) 5 MG tablet Take 1 tablet (5 mg total) by mouth daily before breakfast. 90 tablet 3   metFORMIN (GLUCOPHAGE) 500 MG tablet Take 1 tablet (500 mg total) by mouth 2 (two) times daily with a meal. 180 tablet 3   No facility-administered medications prior to visit.    No Known Allergies  ROS Review of Systems    Objective:    Physical Exam HENT:     Head: Normocephalic.     Nose: Nose normal.     Mouth/Throat:     Mouth: Mucous membranes are moist.     Comments: hoarseness Cardiovascular:     Rate and Rhythm: Normal rate.     Pulses: Normal pulses.  Pulmonary:     Effort: Pulmonary effort is normal.  Abdominal:     Palpations: Abdomen is soft.  Musculoskeletal:     Right lower leg: No edema.     Left lower leg: No edema.  Skin:    General: Skin is warm and dry.     Capillary Refill:  Capillary refill takes less than 2 seconds.     Findings: No rash.  Neurological:     General: No focal deficit present.     Mental Status: He is alert and oriented to person, place, and time.  Psychiatric:        Mood and Affect: Mood normal.        Behavior: Behavior normal.        Thought Content: Thought  content normal.        Judgment: Judgment normal.    BP (!) 162/96   Pulse 76   Temp (!) 81 F (27.2 C)   Ht _0  (1.676 m)   Wt 198 lb 0.6 oz (89.8 kg)   SpO2 97%   BMI 31.96 kg/m  Wt Readings from Last 3 Encounters:  11/17/20 198 lb 0.6 oz (89.8 kg)  10/20/20 196 lb (88.9 kg)  10/18/20 192 lb 3.2 oz (87.2 kg)     Health Maintenance Due  Topic Date Due   FOOT EXAM  Never done   OPHTHALMOLOGY EXAM  Never done    There are no preventive care reminders to display for this patient.  Lab Results  Component Value Date   TSH 4.156 03/21/2019   Lab Results  Component Value Date   WBC 6.7 03/06/2020   HGB 14.7 03/06/2020   HCT 43.5 03/06/2020   MCV 85 03/06/2020   PLT 314 03/06/2020   Lab Results  Component Value Date   NA 140 03/06/2020   K 4.3 03/06/2020   CO2 22 08/13/2019   GLUCOSE 90 03/06/2020   BUN 15 03/06/2020   CREATININE 0.77 03/06/2020   BILITOT 0.6 03/06/2020   ALKPHOS 81 03/06/2020   AST 16 03/06/2020   ALT 15 08/13/2019   PROT 7.4 03/06/2020   ALBUMIN 4.7 03/06/2020   CALCIUM 9.5 03/06/2020   ANIONGAP 12 08/13/2019   Lab Results  Component Value Date   CHOL 134 02/17/2019   Lab Results  Component Value Date   HDL 33 (L) 02/17/2019   Lab Results  Component Value Date   LDLCALC 75 02/17/2019   Lab Results  Component Value Date   TRIG 551 (H) 03/01/2019   Lab Results  Component Value Date   CHOLHDL 4.1 02/17/2019   Lab Results  Component Value Date   HGBA1C 12.3 (A) 10/18/2020   HGBA1C 12.3 10/18/2020   HGBA1C 12.3 (A) 10/18/2020   HGBA1C 12.3 (A) 10/18/2020      Assessment & Plan:   Problem List Items Addressed  This Visit       Endocrine   Uncontrolled type 2 diabetes mellitus with hyperglycemia ([place]) - Primary Persistent  Due to self medicating  Discussed with patient the importance of compliance    Relevant Medications   glipiZIDE (GLUCOTROL) 5 MG tablet   metFORMIN (GLUCOPHAGE) 500 MG tablet   Other Visit Diagnoses     Flu vaccine need       Relevant Orders   Flu Vaccine QUAD 6+ mos PF IM (Fluarix Quad PF) (Completed)   Pneumococcal vaccination given       Relevant Orders   Pneumococcal polysaccharide vaccine 23-valent greater than or equal to 2yo subcutaneous/IM (Completed)   Hypertension, unspecified type     Persistent  Encouraged on going compliance with current medication regimen Encouraged home monitoring and recording BP <130/80 Eating a heart-healthy diet with less salt Encouraged regular physical activity  Recommend Weight loss         Meds ordered this encounter  Medications   glipiZIDE (GLUCOTROL) 5 MG tablet    Sig: Take 1 tablet (5 mg total) by mouth 2 (two) times daily before a meal.    Dispense:  180 tablet    Refill:  3    Order Specific Question:   Supervising Provider    Answer:   Tresa Garter [[id number]]   metFORMIN (GLUCOPHAGE) 500 MG tablet    Sig: Take 1  tablet (500 mg total) by mouth 2 (two) times daily with a meal.    Dispense:  180 tablet    Refill:  3    Order Specific Question:   Supervising Provider    Answer:   Tresa Garter [id number]    Follow-up: Return in about 6 weeks (around 12/29/2020) for follow up [id number].    Vevelyn Francois, NP

## 2020-11-17 NOTE — Patient Instructions (Signed)
Conceptos bsicos de la diabetes mellitus Diabetes Mellitus Basics La diabetes mellitus, o (diabetes) es una enfermedad prolongada (crnica). Se produce cuando el cuerpo no utiliza Occupational hygienist (glucosa) que se libera de los alimentos despus de comer. La diabetes mellitus puede deberse a uno de Mirant o a ambos: El pncreas no produce suficiente cantidad de una hormona llamada insulina. El cuerpo no reacciona de forma normal a la insulina que produce. La insulina permite que la glucosa ingrese a las clulas del cuerpo. Esto le proporciona energa. Si tiene diabetes, la glucosa no puede ingresar a las clulas. Esto produce un aumento de la glucemia (hiperglucemia). Cmo tratar y Chief Technology Officer la diabetes Es posible que tenga que administrarse insulina u otros medicamentos para la diabetes todos los [place] para mantener el nivel de glucosa en la sangre equilibrado. Si le recetan insulina, aprender cmo aplicrsela con inyecciones. Es posible que deba ajustar la cantidad de insulina en funcin de los alimentos que coma. Tendr que controlarse los niveles de glucemia con un monitor de glucosa como se lo haya indicado el mdico. Las lecturas pueden ayudar a Teacher, adult education si tiene un nivel bajo o alto de glucemia. Generalmente, los resultados de los niveles de glucemia deben ser los siguientes: Antes de las comidas (preprandial): de 80 a 130 mg/dl (de 4,4 a 7,2 mmol/l). Despus de las comidas (posprandial): por debajo de 180 mg/dl (10 mmol/l). Nivel de hemoglobina A1c (HbA1c): menos del 7 %. El mdico fijar objetivos de tratamiento para usted. Cumpla con todas las visitas de seguimiento. Esto es importante. Siga estas instrucciones en su casa: Medicamentos para la diabetes Use los medicamentos para la diabetes como se lo haya indicado el mdico. Haga una lista de los medicamentos para la diabetes aqu: Nombre del medicamento: ______________________________ [employer] (dosis):  ________________ Mellody Drown (a. m./p. m.): _______________ Wilford Grist: ___________________________________ Valrie Hart del medicamento: ______________________________ Cantidad (dosis): ________________ Mellody Drown (a. m./p. m.): _______________ Notas: ___________________________________ Valrie Hart del medicamento: ______________________________ Cantidad (dosis): ________________ Mellody Drown (a. m./p. m.): _______________ Notas: ___________________________________ Missy Sabins [place] insulina, haga una lista de los tipos de [place] aqu: Tipo de insulina: ______________________________ Cantidad (dosis): ________________ Mellody Drown (a. m./p. m.): _______________Notas: ___________________________________ Suezanne Jacquet: ______________________________ Cantidad (dosis): ________________ Mellody Drown (a. m./p. m.): _______________ Notas: ___________________________________ Suezanne Jacquet: ______________________________ Cantidad (dosis): ________________ Mellody Drown (a. m./p. m.): _______________ Notas: ___________________________________ Suezanne Jacquet: ______________________________ Cantidad (dosis): ________________ Mellody Drown (a. m./p. m.): _______________ Notas: ___________________________________ Suezanne Jacquet: ______________________________ Cantidad (dosis): ________________ Mellody Drown (a. m./p. m.): _______________ Notas: ___________________________________ Cmo controlar la glucemia Contrlese los niveles de glucemia con un monitor de glucosa como se lo haya indicado el mdico. Anote las veces que se controla los Dennis de glucosa: Hora: _______________ Wilford Grist: ___________________________________ Hora: _______________ Notas: ___________________________________ Hora: _______________ Notas: ___________________________________ Hora: _______________ Notas: ___________________________________ Hora: _______________ Notas: ___________________________________ Hora: _______________ Notas: ___________________________________  Rosalyn Charters de  glucemia El nivel bajo de glucemia (hipoglucemia) se produce cuando la glucosa es igual o menor que 70 mg/dl (3.9 mmol/l). Entre los sntomas, se pueden incluir los siguientes: Sentir: Rudolph. Sudoracin y piel hmeda. Irritabilidad o enfado con facilidad. Mareos. Somnolencia. Tener: Latidos cardacos acelerados. Dolor de [place]. Cambios en la visin. Hormigueo o adormecimiento alrededor de la boca, labios o lengua. Dificultades para hacer lo siguiente: Moverse (coordinacin). Dormir. Cmo tratar un nivel bajo de glucemia Para tratar un nivel bajo de glucemia, ingiera un alimento o una bebida azucarada de inmediato. Si puede pensar con claridad y tragar de manera segura, siga la regla 15/15, que consiste en lo siguiente:  Tome 15 gramos de un hidrato de carbono (carbohidrato) de accin rpida, como le indic su mdico. Algunos hidratos de carbono de accin rpida son: Comprimidos de glucosa: tome 3 o 4 comprimidos. Caramelos duros: coma 3 a 5 unidades. Jugo de fruta: beba 4 onzas (120 ml). Refresco comn (no diettico): beba de 4 a 6 onzas (de 120 a 180 ml). Miel o azcar: coma 1 cucharada (15 ml). Verifique sus niveles de glucemia 15 minutes despus de ingerir el hidrato de carbono. Si la glucosa todava es igual o menor que 70 mg/dl (3.9 mmol/l), ingiera nuevamente 15 gramos de un hidrato de carbono. Si la glucosa no supera los 70 mg/dl (3.9 mmol/l) despus de 3 intentos, solicite ayuda de inmediato. Ingiera una comida o una colacin en el transcurso de 1 hora despus de que la glucosa se haya normalizado. Cmo tratar un nivel muy bajo de glucemia Si la glucosa es igual o menor que 54 mg/dl (3 mmol/l), significa que su nivel de glucemia est muy bajo (hipoglucemia grave). Esto es Radio broadcast assistant. No espere a ver si los sntomas desaparecen. Solicite atencin mdica de inmediato. Comunquese con el servicio de emergencias de su localidad (911 en los [place]). No conduzca por sus  propios medios [facility]. Preguntas para hacerle al mdico Es necesario que converse con un educador en el cuidado de la diabetes? Qu equipos necesitar para cuidarme en casa? Qu medicamentos para la diabetes necesito? Cundo debo tomarlos? Con qu frecuencia debo comprobar mis niveles de glucemia? A qu nmero puedo llamar si tengo preguntas? Cundo es mi visita de seguimiento? Dnde puedo encontrar un grupo de apoyo para las personas con diabetes? Dnde buscar ms informacin American Diabetes Association (Asociacin Estadounidense de la Diabetes): www.diabetes.org Association of Diabetes Care and Education Specialists (Asociacin de Especialistas en Atencin y Educacin sobre la Diabetes): www.diabeteseducator.org Comunquese con un mdico si: El nivel de glucemia es mayor o igual que 240 mg/dl (16.1 mmol/dl) durante 2 das seguidos. Ha estado enfermo o ha tenido fiebre durante 2 [address]  [address] o ms y no [employer]. Tiene alguno de estos problemas durante ms de 6 horas: No puede comer ni beber. Siente nuseas. Vomita. Tiene diarrea. Solicite ayuda de inmediato si: Su nivel de glucemia est por debajo de 54 mg/dl (3 mmol/l). Se siente confundido. Tiene problemas para pensar con claridad. Tiene dificultad para respirar. Estos sntomas pueden representar un problema grave que constituye Radio broadcast assistant. No espere a ver si los sntomas desaparecen. Solicite atencin mdica de inmediato. Comunquese con el servicio de emergencias de su localidad (911 en los [place]). No conduzca por sus propios medios [employer]. Resumen La diabetes mellitus es una enfermedad crnica que se produce cuando el cuerpo no utiliza Artist (glucosa) que se libera de los alimentos despus de comer. Aplquese la insulina y tome los medicamentos para la diabetes como se lo hayan indicado. Controle su nivel de glucemia todos los [place], con la frecuencia que le hayan  indicado. Cumpla con todas las visitas de seguimiento. Esto es importante. Esta informacin no tiene Theme park manager el consejo del mdico. Asegrese de hacerle al mdico cualquier pregunta que tenga. Document Revised: 06/30/2019 Document Reviewed: 06/30/2019 Elsevier Patient Education  2022 Elsevier Inc.   Diabetes mellitus y nutricin, en adultos Diabetes Mellitus and Nutrition, Adult Si sufre de diabetes, o diabetes mellitus, es muy importante tener hbitos alimenticios saludables debido a que sus niveles de Psychologist, counselling sangre (glucosa) se ven afectados en gran medida por lo  que come y bebe. Comer alimentos saludables en las cantidades correctas, aproximadamente a la misma hora todos los [place], [place] ayudar a: Chief Operating Officer su glucemia. Disminuir el riesgo de sufrir una enfermedad cardaca. Mejorar la presin arterial. Barista o mantener un peso saludable. Qu puede afectar mi plan de alimentacin? Todas las personas que sufren de diabetes son diferentes y cada una tiene necesidades diferentes en cuanto a un plan de alimentacin. El mdico puede recomendarle que trabaje con un nutricionista para elaborar el mejor plan para usted. Su plan de alimentacin puede variar segn factores como: Las caloras que necesita. Los medicamentos que toma. Su peso. Sus niveles de glucemia, presin arterial y colesterol. Su nivel de [place]. Otras afecciones que tenga, como enfermedades cardacas o renales. Cmo me afectan los carbohidratos? Los carbohidratos, o hidratos de carbono, afectan su nivel de glucemia ms que cualquier otro tipo de alimento. La ingesta de carbohidratos aumenta la cantidad de [employer]. Es importante conocer la cantidad de carbohidratos que se pueden ingerir en cada comida sin correr Surveyor, minerals. Esto es Government social research officer. Su nutricionista puede ayudarlo a calcular la cantidad de carbohidratos que debe ingerir en cada comida y en cada refrigerio. Cmo me  afecta el alcohol? El alcohol puede provocar una disminucin de la glucemia (hipoglucemia), especialmente si [place] insulina o toma determinados medicamentos por va oral para la diabetes. La hipoglucemia es una afeccin potencialmente mortal. Los sntomas de la hipoglucemia, como somnolencia, mareos y confusin, son similares a los sntomas de haber consumido demasiado alcohol. No beba alcohol si: Su mdico le indica no hacerlo. Est embarazada, puede estar embarazada o est tratando de [place]. Si bebe alcohol: Limite la cantidad que bebe a lo siguiente: De 0 a 1 medida por da para las mujeres. De 0 a 2 medidas por da para los hombres. Sepa cunta cantidad de alcohol hay en las bebidas que toma. En los [address], una medida equivale a una botella de cerveza de 12 oz (355 ml), un vaso de vino de 5 oz (148 ml) o un vaso de una bebida alcohlica de alta graduacin de 1 oz (44 ml). Mantngase hidratado bebiendo agua, refrescos dietticos o t helado sin azcar. Tenga en cuenta que los refrescos comunes, los jugos y otras bebidas para mezclar pueden contener Product/process development scientist y se deben contar como carbohidratos. Consejos para seguir Consulting civil engineer las etiquetas de los alimentos Comience por leer el tamao de la porcin en la etiqueta de Informacin nutricional de los alimentos envasados y las bebidas. La cantidad de caloras, carbohidratos, grasas y otros nutrientes detallados en la etiqueta se basan en una porcin del alimento. Muchos alimentos contienen ms de una porcin por envase. Verifique la cantidad total de gramos (g) de carbohidratos totales en una porcin. Verifique la cantidad de gramos de grasas saturadas y grasas trans en una porcin. Escoja alimentos que no contengan estas grasas o que su contenido de estas sea [place]. Verifique la cantidad de miligramos (mg) de sal (sodio) en una porcin. La [employer]ley[employer]son de las personas deben limitar la ingesta de sodio total a menos de 2300 mg NIKE. Siempre consulte la informacin nutricional de los alimentos etiquetados como "con bajo contenido de grasa" o "sin grasa". Estos alimentos pueden tener un mayor contenido de International aid/development worker agregada o carbohidratos refinados, y deben evitarse. Hable con su nutricionista para identificar sus objetivos diarios en cuanto a los nutrientes mencionados en la etiqueta. Al ir de compras Evite comprar alimentos procesados, enlatados o  precocidos. Estos alimentos tienden a Counselling psychologist mayor cantidad de [place], sodio y azcar agregada. Compre en la zona exterior de la tienda de comestibles. Esta es la zona donde se encuentran con mayor frecuencia las frutas y las verduras frescas, los cereales a granel, las carnes frescas y los productos lcteos frescos. Al cocinar Use mtodos de coccin a baja temperatura, como hornear, en lugar de mtodos de coccin a alta temperatura, como frer en abundante aceite. Cocine con aceites saludables, como el aceite de [place], canola o [employer]. Evite cocinar con manteca, crema o carnes con alto contenido de grasa. Planificacin de las comidas Coma las comidas y los refrigerios regularmente, preferentemente a la misma hora todos [place]. Evite pasar largos perodos de tiempo sin comer. Consuma alimentos ricos en fibra, como frutas frescas, verduras, frijoles y cereales integrales. Consuma entre 4 y 6 onzas (entre 112 y 168 g) de protenas magras por da, como carnes [place], pollo, pescado, huevos o tofu. Una onza (oz) (28 g) de protena magra equivale a: 1 onza (28 g) de carne, pollo o pescado. 1 huevo.  taza (62 g) de tofu. Coma algunos alimentos por da que contengan grasas saludables, como aguacates, frutos secos, semillas y pescado. Qu alimentos debo comer? Nils Pyle Bayas. Manzanas. Naranjas. Duraznos. Damascos. Ciruelas. Uvas. Mangos. Papayas. Granadas. Kiwi. Cerezas. Verduras Verduras de [employer]ott, que incluyen [place], [place], col rizada, acelga, hojas de berza, hojas de  mostaza y repollo. Remolachas. Coliflor. Brcoli. Zanahorias. Judas verdes. Tomates. Pimientos. Cebollas. Pepinos. Coles de Bruselas. Granos Granos integrales, como panes, galletas, tortillas, cereales y pastas de salvado o integrales. Avena sin azcar. Quinua. Arroz integral o salvaje. Carnes y otras protenas Frutos de mar. Carne de ave sin piel. Cortes magros de ave y carne de res. Tofu. Frutos secos. Semillas. Lcteos Productos lcteos sin grasa o con bajo contenido de [place], [place], yogur y [place]. Es posible que los productos detallados arriba no constituyan una lista completa de los alimentos y las bebidas que puede tomar. Consulte a un nutricionista para obtener ms informacin. Qu alimentos debo evitar? Nils Pyle Frutas enlatadas al almbar. Verduras Verduras enlatadas. Verduras congeladas con mantequilla o salsa de crema. Granos Productos elaborados con [place] y [place], como panes, pastas, bocadillos y cereales. Evite todos los alimentos procesados. Carnes y [address] de carne con alto contenido de Holiday representative. Carne de ave con piel. Carnes empanizadas o fritas. Carne procesada. Evite las grasas saturadas. Lcteos Yogur, queso o [employer] Health. Bebidas Bebidas azucaradas, como gaseosas o t helado. Es posible que los productos que se enumeran ms [place] no constituyan una lista completa de los alimentos y las bebidas que Personnel officer. Consulte a un nutricionista para obtener ms informacin. Preguntas para hacerle al mdico Debo consultar con un especialista certificado en atencin y educacin sobre la diabetes? Es necesario que me rena con un nutricionista? A qu nmero puedo llamar si tengo preguntas? Cules son los mejores momentos para controlar la glucemia? Dnde encontrar ms informacin: American Diabetes Association (Asociacin Estadounidense de la Diabetes): diabetes.org Academy of Nutrition and Dietetics (Academia de Nutricin y  Pension scheme manager): eatright.Dana Corporation of Diabetes and Digestive and Kidney Diseases [employer] Company de la Diabetes y las Enfermedades Digestivas y Renales): StageSync.si Association of Diabetes Care & Education Specialists (Asociacin de Especialistas en Atencin y Educacin sobre la Diabetes): diabeteseducator.org Resumen Es importante tener hbitos alimenticios saludables debido a que sus niveles de Psychologist, counselling sangre (glucosa) se ven afectados en gran medida por lo que come y  bebe. Es importante consumir alcohol con prudencia. Un plan de comidas saludable lo ayudar a controlar la glucosa en sangre y a reducir el riesgo de enfermedades cardacas. El mdico puede recomendarle que trabaje con un nutricionista para elaborar el mejor plan para usted. Esta informacin no tiene Theme park manager el consejo del mdico. Asegrese de hacerle al mdico cualquier pregunta que tenga. Document Revised: 09/08/2019 Document Reviewed: 09/08/2019 Elsevier Patient Education  2022 [employer].

## 2020-12-04 ENCOUNTER — Other Ambulatory Visit: Payer: Self-pay

## 2020-12-04 ENCOUNTER — Ambulatory Visit: Payer: Medicaid Other | Admitting: Podiatry

## 2020-12-04 ENCOUNTER — Ambulatory Visit (INDEPENDENT_AMBULATORY_CARE_PROVIDER_SITE_OTHER): Payer: Medicaid Other

## 2020-12-04 DIAGNOSIS — Z89412 Acquired absence of left great toe: Secondary | ICD-10-CM

## 2020-12-04 DIAGNOSIS — E0843 Diabetes mellitus due to underlying condition with diabetic autonomic (poly)neuropathy: Secondary | ICD-10-CM

## 2020-12-04 DIAGNOSIS — Z89411 Acquired absence of right great toe: Secondary | ICD-10-CM | POA: Diagnosis not present

## 2020-12-04 NOTE — Progress Notes (Signed)
   HPI: 55 y.o. male PMHx DM type II presenting for follow-up evaluation of history of bilateral toe amputations.  Patient states that he has tightness to his feet bilaterally.  He is wondering about possible toe prosthetics.  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  Dermatology: Skin is warm, dry and supple bilateral lower extremities. Negative for open lesions or macerations.  Vascular: Diminished but palpable pedal pulses bilaterally. No edema or erythema noted.   Neurological: Epicritic and protective threshold grossly intact bilaterally.   Musculoskeletal Exam: Amputation of the bilateral great toes as well as the right second toe  Assessment: 1.  Diabetes mellitus type 2 2.  History of bilateral toe amputations   Plan of Care:  1. Patient evaluated. 2.  Comprehensive diabetic foot exam was performed today. 3.  Continue diabetic shoes with diabetic insoles and a toe filler 4.  Continue management of the gabapentin and tramadol with her PCP 5.  Return to clinic annually   Felecia Shelling, DPM [facility]  Dr. Felecia Shelling, DPM    [address] [place], [place] [address]                Office [phone]  Fax [phone]

## 2020-12-05 ENCOUNTER — Telehealth: Payer: Self-pay

## 2020-12-05 NOTE — Telephone Encounter (Signed)
Patient daughter Nelva Bush called stating patient is still having a lot of pain and wanted to increase the Tramadol.

## 2020-12-11 NOTE — Telephone Encounter (Signed)
He's literally at the max dose of Tramadol- and we discussed that the next "medication" is Hydrocodone- he didn't bring this up at the last appointment- I don't usually change types of meds in between appointments, but if we can arrange a phone call- with translator, can try- thanks _ML

## 2020-12-15 ENCOUNTER — Encounter: Payer: Self-pay | Admitting: Physical Medicine and Rehabilitation

## 2020-12-15 ENCOUNTER — Other Ambulatory Visit: Payer: Self-pay

## 2020-12-15 ENCOUNTER — Encounter
Payer: Medicaid Other | Attending: Physical Medicine and Rehabilitation | Admitting: Physical Medicine and Rehabilitation

## 2020-12-15 VITALS — BP 160/95 | HR 82 | Ht 66.0 in | Wt 198.6 lb

## 2020-12-15 DIAGNOSIS — I63119 Cerebral infarction due to embolism of unspecified vertebral artery: Secondary | ICD-10-CM | POA: Diagnosis not present

## 2020-12-15 DIAGNOSIS — M792 Neuralgia and neuritis, unspecified: Secondary | ICD-10-CM | POA: Diagnosis not present

## 2020-12-15 DIAGNOSIS — I69398 Other sequelae of cerebral infarction: Secondary | ICD-10-CM | POA: Diagnosis not present

## 2020-12-15 DIAGNOSIS — Z9989 Dependence on other enabling machines and devices: Secondary | ICD-10-CM | POA: Insufficient documentation

## 2020-12-15 DIAGNOSIS — R2689 Other abnormalities of gait and mobility: Secondary | ICD-10-CM | POA: Insufficient documentation

## 2020-12-15 DIAGNOSIS — G8191 Hemiplegia, unspecified affecting right dominant side: Secondary | ICD-10-CM | POA: Diagnosis not present

## 2020-12-15 MED ORDER — PREGABALIN 50 MG PO CAPS: 50.0000 mg | ORAL_CAPSULE | Freq: Two times a day (BID) | ORAL | 5 refills | Status: DC

## 2020-12-15 NOTE — Progress Notes (Signed)
Subjective:    Patient ID: Andrew Bruce, male    DOB: April 20, 1965, 55 y.o.   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 Original admission 01/26/19- and came to CIR 03/23/19- discharged- 04/20/19.  Has visual difficulties- double vision due to stroke.  Here for f/u on stroke, etc as above.    Having more pain in RLE-  Burning pain- all the time- constant-  Has never tried Lyrica-    Theres another medicine that makes him so sleepy.  Still taking Cymbalta 60 mg daily.   Still very sleepy- but taking Cymbalta in AM.   Difficulty getting disability- but he won't call pt back.  Went to [place]. And physical.    Pain Inventory Average Pain 7 Pain Right Now 8 My pain is sharp and tingling  In the last 24 hours, has pain interfered with the following? General activity 1 Relation with others 5 Enjoyment of life 1 What TIME of day is your pain at its worst? morning  and daytime Sleep (in general) NA  Pain is worse with: walking and sitting Pain improves with: heat/ice, therapy/exercise, pacing activities, and medication Relief from Meds: 2  Family History  Problem Relation Age of Onset   Healthy Sister    Healthy Brother    Social History   Socioeconomic History   Marital status: Married    Spouse name: Not on file   Number of children: Not on file   Years of education: Not on file   Highest education level: Not on file  Occupational History   Not on file  Tobacco Use   Smoking status: Former   Smokeless tobacco: Never  Vaping Use   Vaping Use: Never used   Substance and Sexual Activity   Alcohol use: Yes    Comment: rare to occasional   Drug use: Never   Sexual activity: Yes    Birth control/protection: None  Other Topics Concern   Not on file  Social History Narrative   Not on file   Social Determinants of Health   Financial Resource Strain: Not on file  Food Insecurity: Not on file  Transportation Needs: Not on file  Physical Activity: Not on file  Stress: Not on file  Social Connections: Not on file   Past Surgical History:  Procedure Laterality Date   AMPUTATION Right 08/13/2019   Procedure: AMPUTATION RIGHT FIRST AND SECOND TOES;  Surgeon: Larina Earthly, MD;  Location: [facility] OR;  Service: Vascular;  Laterality: Right;   DIRECT LARYNGOSCOPY Bilateral 04/06/2019   Procedure: MICRO DIRECT LARYNGOSCOPY WITH PROLARYN INJECTION;  Surgeon: Christia Reading, MD;  Location: [facility] OR;  Service: ENT;  Laterality: Bilateral;   IR GASTROSTOMY TUBE MOD SED  03/10/2019   IR GASTROSTOMY TUBE REMOVAL  04/20/2019   WOUND DEBRIDEMENT Left 08/13/2019   Procedure: AMPUTATION  OF LEFT FIRST TOE, AND DEBRIDEMENT OF LEFT SECOND, THIRD AND FOURTH TOES;  Surgeon: Larina Earthly, MD;  Location: [facility] OR;  Service: Vascular;  Laterality: Left;   Past Surgical History:  Procedure Laterality Date  AMPUTATION Right 08/13/2019   Procedure: AMPUTATION RIGHT FIRST AND SECOND TOES;  Surgeon: Larina EarthlyEarly, Todd F, MD;  Location: [facility] OR;  Service: Vascular;  Laterality: Right;   DIRECT LARYNGOSCOPY Bilateral 04/06/2019   Procedure: MICRO DIRECT LARYNGOSCOPY WITH PROLARYN INJECTION;  Surgeon: Christia ReadingBates, Dwight, MD;  [facility];  Service: ENT;  Laterality: Bilateral;   IR GASTROSTOMY TUBE MOD SED  03/10/2019   IR GASTROSTOMY TUBE REMOVAL  04/20/2019   WOUND DEBRIDEMENT Left 08/13/2019   Procedure: AMPUTATION  OF LEFT FIRST TOE, AND DEBRIDEMENT OF LEFT SECOND, THIRD AND FOURTH TOES;  Surgeon: Larina EarthlyEarly, Todd F, MD;  Location: [facility] OR;  Service: Vascular;  Laterality: Left;   Past Medical  History:  Diagnosis Date   COVID-19 02/06/2019   Diabetes mellitus without complication (HCC)    Hypertension    PE (pulmonary thromboembolism) (HCC)    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   Pneumonia    with Covid   Stroke (HCC)    weakness on right side, occurred while he had Covid   BP (!) 160/95   Pulse 82   Ht 5\' 6"  (1.676 m)   Wt 198 lb 9.6 oz (90.1 kg)   SpO2 95%   BMI 32.05 kg/m   Opioid Risk Score:   Fall Risk Score:  `1  Depression screen PHQ 2/9  [facility] 12/15/2020 10/18/2020 04/21/2020 03/06/2020 06/25/2019 06/10/2019 05/13/2019  Decreased Interest 3 0 0 0 0 0 0  Down, Depressed, Hopeless 3 0 0 0 0 0 0  PHQ - 2 Score 6 0 0 0 0 0 0     Review of Systems  Constitutional: Negative.   HENT: Negative.    Eyes: Negative.   Respiratory: Negative.    Cardiovascular: Negative.   Gastrointestinal: Negative.   Endocrine: Negative.   Genitourinary: Negative.   Musculoskeletal:  Positive for gait problem.       Bilateral leg pain and feels like foot swelling  Skin: Negative.   Allergic/Immunologic: Negative.   Neurological:        Tingling  Hematological: Negative.   Psychiatric/Behavioral:  Positive for dysphoric mood.   All other systems reviewed and are negative.     Objective:   Physical Exam BP 160/95- pulse 82 Awake, alert, appropriate, interpretor in room, using quad cane to walk, NAD  Skin on feet too dry- missing L 1st toe and R 1st/2nd toes- healed but dry/cracking  MS: RUE- Biceps 4/5; Triceps 4-/5; WE 4+/5 grip 4/5 and Finger abd 4+/5 LUE- 5/5 proximally and 5-/5 in distal muscles RLE- HF 4/5; KE/KF 4-/5; DF 4/5 and PF 3+/5 LLE- HF 5/5; KE/KF 5-/5; DF 4+/5 and PF 4+/5 Cannot bend 4th/5th digits well on R hand  Neuro: Impaired to light touch in entire R side.  Impaired coordination- very slowed and impaired on R hand and R foot  Very slowed getting shoes and socks back on.   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 Original admission 01/26/19- and  came to CIR 03/23/19- discharged- 04/20/19.  Has visual difficulties- double vision due to stroke.  Here for f/u on stroke, etc as above.   Change Duloxetine/Cymbalta to bedtime-  en la noche.  Can make people sleepy on Cymbalta-   2. Nerve pain from stroke- Lyrica/Pregabalin-  50 mg 2x/day x 1 week then 100 mg 2x/day- for nerve pain  3. Keep using Tramadol if needs it- won't stop it.    4. Won't give a stronger narcotic" because they don't treat nerve pain.    5.  Check with lawyer about disability-    6. F/U in 3 months- let me know by phone how Lyrica doing.    I spent a total of 26 minutes on total care/visit- due to interpretor and discussing pain meds.

## 2020-12-15 NOTE — Patient Instructions (Signed)
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 Original admission 01/26/19- and came to CIR 03/23/19- discharged- 04/20/19.  Has visual difficulties- double vision due to stroke.  Here for f/u on stroke, etc as above.   Change Duloxetine/Cymbalta to bedtime-  en la noche.  Can make people sleepy on Cymbalta-   2. Nerve pain from stroke- Lyrica/Pregabalin-  50 mg 2x/day x 1 week then 100 mg 2x/day- for nerve pain  3. Keep using Tramadol if needs it- won't stop it.    4. Won't give a stronger narcotic" because they don't treat nerve pain.    5.  Check with lawyer about disability-    6. F/U in 3 months- let me know by phone how Lyrica doing.

## 2020-12-28 ENCOUNTER — Ambulatory Visit: Payer: Self-pay | Admitting: Nurse Practitioner

## 2020-12-29 ENCOUNTER — Ambulatory Visit: Payer: Self-pay | Admitting: Nurse Practitioner

## 2021-01-04 ENCOUNTER — Other Ambulatory Visit: Payer: Self-pay

## 2021-01-04 ENCOUNTER — Encounter: Payer: Self-pay | Admitting: Nurse Practitioner

## 2021-01-04 ENCOUNTER — Ambulatory Visit (INDEPENDENT_AMBULATORY_CARE_PROVIDER_SITE_OTHER): Payer: Medicaid Other | Admitting: Nurse Practitioner

## 2021-01-04 VITALS — BP 153/93 | HR 78 | Temp 98.2°F | Ht 66.0 in | Wt 201.0 lb

## 2021-01-04 DIAGNOSIS — E119 Type 2 diabetes mellitus without complications: Secondary | ICD-10-CM | POA: Diagnosis not present

## 2021-01-04 DIAGNOSIS — E1165 Type 2 diabetes mellitus with hyperglycemia: Secondary | ICD-10-CM

## 2021-01-04 DIAGNOSIS — R7309 Other abnormal glucose: Secondary | ICD-10-CM

## 2021-01-04 DIAGNOSIS — I10 Essential (primary) hypertension: Secondary | ICD-10-CM

## 2021-01-04 DIAGNOSIS — J3800 Paralysis of vocal cords and larynx, unspecified: Secondary | ICD-10-CM | POA: Diagnosis not present

## 2021-01-04 LAB — POCT URINALYSIS DIP (CLINITEK)
Bilirubin, UA: NEGATIVE
Blood, UA: NEGATIVE
Glucose, UA: NEGATIVE mg/dL
Ketones, POC UA: NEGATIVE mg/dL
Leukocytes, UA: NEGATIVE
Nitrite, UA: NEGATIVE
POC PROTEIN,UA: NEGATIVE
Spec Grav, UA: >=1.03 — AB (ref 1.010–1.025)
Urobilinogen, UA: 0.2 E.U./dL
pH, UA: 6 (ref 5.0–8.0)

## 2021-01-04 LAB — POCT GLYCOSYLATED HEMOGLOBIN (HGB A1C)
HbA1c POC (<> result, manual entry): 7.9 % (ref 4.0–5.6)
HbA1c, POC (controlled diabetic range): 7.9 % — AB (ref 0.0–7.0)
HbA1c, POC (prediabetic range): 7.9 % — AB (ref 5.7–6.4)
Hemoglobin A1C: 7.9 % — AB (ref 4.0–5.6)

## 2021-01-04 MED ORDER — AMLODIPINE BESYLATE 10 MG PO TABS: 10.0000 mg | ORAL_TABLET | Freq: Every day | ORAL | 1 refills | Status: DC

## 2021-01-04 NOTE — Patient Instructions (Signed)
Hipertensi?n en los adultos ?Hypertension, Adult ?El t?rmino hipertensi?n es otra forma de denominar a la presi?n arterial elevada. La presi?n arterial elevada fuerza al coraz?n a trabajar m?s para bombear la sangre. Esto puede causar problemas con el paso del tiempo. ?Una lectura de presi?n arterial est? compuesta por 2 n?meros. Hay un n?mero superior (sist?lico) sobre un n?mero inferior (diast?lico). Lo ideal es tener la presi?n arterial por debajo de 120/80. Las elecciones saludables pueden ayudar a bajar la presi?n arterial, o tal vez necesite medicamentos para bajarla. ??Cu?les son las causas? ?Se desconoce la causa de esta afecci?n. Algunas afecciones pueden estar relacionadas con la presi?n arterial alta. ??Qu? incrementa el riesgo? ?Fumar. ?Tener diabetes mellitus tipo 2, colesterol alto, o ambos. ?No hacer la cantidad suficiente de actividad f?sica o ejercicio. ?Tener sobrepeso. ?Consumir mucha grasa, az?car, calor?as o sal (sodio) en su dieta. ?Beber alcohol en exceso. ?Tener una enfermedad renal a largo plazo (cr?nica). ?Tener antecedentes familiares de presi?n arterial alta. ?Edad. Los riesgos aumentan con la edad. ?Raza. El riesgo es mayor para las personas afroamericanas. ?Sexo. Antes de los 45 a?os, los hombres corren m?s riesgo que las mujeres. Despu?s de los 65 a?os, las mujeres corren m?s riesgo que los hombres. ?Tener apnea obstructiva del sue?o. ?Estr?s. ??Cu?les son los signos o los s?ntomas? ?Es posible que la presi?n arterial alta puede no cause s?ntomas. La presi?n arterial muy alta (crisis hipertensiva) puede provocar: ?Dolor de cabeza. ?Sensaciones de preocupaci?n o nerviosismo (ansiedad). ?Falta de aire. ?Hemorragia nasal. ?Sensaci?n de malestar en el est?mago (n?useas). ?V?mitos. ?Cambios en la forma de ver. ?Dolor muy intenso en el pecho. ?Convulsiones. ??C?mo se trata? ?Esta afecci?n se trata haciendo cambios saludables en el estilo de vida, por ejemplo: ?Consumir alimentos  saludables. ?Hacer m?s ejercicio. ?Beber menos alcohol. ?El m?dico puede recetarle medicamentos si los cambios en el estilo de vida no son suficientes para lograr controlar la presi?n arterial y si: ?El n?mero de arriba est? por encima de 130. ?El n?mero de abajo est? por encima de 80. ?Su presi?n arterial personal ideal puede variar. ?Siga estas instrucciones en su casa: ?Comida y bebida ? ?Si se lo dicen, siga el plan de alimentaci?n de DASH (Dietary Approaches to Stop Hypertension, Maneras de alimentarse para detener la hipertensi?n). Para seguir este plan: ?Llene la mitad del plato de cada comida con frutas y verduras. ?Llene un cuarto del plato de cada comida con cereales integrales. Los cereales integrales incluyen pasta integral, arroz integral y pan integral. ?Coma y beba productos l?cteos con bajo contenido de grasa, como leche descremada o yogur bajo en grasas. ?Llene un cuarto del plato de cada comida con prote?nas bajas en grasa (magras). Las prote?nas bajas en grasa incluyen pescado, pollo sin piel, huevos, frijoles y tofu. ?Evite consumir carne grasa, carne curada y procesada, o pollo con piel. ?Evite consumir alimentos prehechos o procesados. ?Consuma menos de 1500 mg de sal por d?a. ?No beba alcohol si: ?El m?dico le indica que no lo haga. ?Est? embarazada, puede estar embarazada o est? tratando de quedar embarazada. ?Si bebe alcohol: ?Limite la cantidad que bebe a lo siguiente: ?De 0 a 1 medida por d?a para las mujeres. ?De 0 a 2 medidas por d?a para los hombres. ?Est? atento a la cantidad de alcohol que hay en las bebidas que toma. En los [place], una medida equivale a una botella de cerveza de 12 oz (355 ml), un vaso de vino de 5 oz (148 ml) o un vaso de una bebida alcoh?lica de   alta graduaci?n de 1? oz (44 ml). ?Estilo de vida ? ?Trabaje con su m?dico para mantenerse en un peso saludable o para perder peso. Preg?ntele a su m?dico cu?l es el peso recomendable para usted. ?Haga al menos 30  minutos de ejercicio la mayor?a de los d?as de la semana. Estos pueden incluir caminar, nadar o andar en bicicleta. ?Realice al menos 30 minutos de ejercicio que fortalezca sus m?sculos (ejercicios de resistencia) al menos 3 d?as a la semana. Estos pueden incluir levantar pesas o hacer [place]. ?No consuma ning?n producto que contenga nicotina o tabaco, como cigarrillos, cigarrillos electr?nicos y tabaco de mascar. Si necesita ayuda para dejar de fumar, consulte al m?dico. ?Controle su presi?n arterial en su casa tal como le indic? el m?dico. ?Concurra a todas las visitas de seguimiento como se lo haya indicado el m?dico. Esto es importante. ?Medicamentos ?Tome los medicamentos de venta libre y los recetados solamente como se lo haya indicado el m?dico. Siga cuidadosamente las indicaciones. ?No omita las dosis de medicamentos para la presi?n arterial. Los medicamentos pierden eficacia si omite dosis. El hecho de omitir las dosis tambi?n aumenta el riesgo de otros problemas. ?Preg?ntele a su m?dico a qu? efectos secundarios o reacciones a los medicamentos debe prestar atenci?n. ?Comun?quese con un m?dico si: ?Piensa que tiene una reacci?n a los medicamentos que est? tomando. ?Tiene dolores de cabeza frecuentes (recurrentes). ?Se siente mareado. ?Tiene hinchaz?n en los tobillos. ?Tiene problemas de visi?n. ?Solicite ayuda inmediatamente si: ?Siente un dolor de cabeza muy intenso. ?Empieza a sentirse desorientado (confundido). ?Se siente d?bil o adormecido. ?Siente que va a desmayarse. ?Tiene un dolor muy intenso en las siguientes zonas: ?Pecho. ?Vientre (abdomen). ?Vomita m?s de una vez. ?Tiene dificultad para respirar. ?Resumen ?El t?rmino hipertensi?n es otra forma de denominar a la presi?n arterial elevada. ?La presi?n arterial elevada fuerza al coraz?n a trabajar m?s para bombear la sangre. ?Para la mayor?a de las personas, una presi?n arterial normal es menor que 120/80. ?Las decisiones saludables pueden ayudarle  a disminuir su presi?n arterial. Si no puede bajar su presi?n arterial mediante decisiones saludables, es posible que deba tomar medicamentos. ?Esta informaci?n no tiene como fin reemplazar el consejo del m?dico. Aseg?rese de hacerle al m?dico cualquier pregunta que tenga. ?Document Revised: 10/16/2017 Document Reviewed: 10/16/2017 ?Elsevier Patient Education ? 2022 Elsevier Inc. ? ?

## 2021-01-04 NOTE — Progress Notes (Signed)
[place], [place]  [address] Phone:  [phone]   Fax:  [phone]   Established Patient Office Visit  Subjective:  Patient ID: Andrew Bruce, male    DOB: [birth year]  Age: 55 y.o. MRN: [id number]  CC:  Chief Complaint  Patient presents with   Follow-up    Pt is here for 6 weeks FU. Pt stated he has a lot a pain in his leg and feet    HPI Andrew Bruce presents for follow up. He  has a past medical history of COVID-19 (02/06/2019), Diabetes mellitus without complication ([place]), Hypertension, PE (pulmonary thromboembolism) ([place]), Pneumonia, and Stroke ([place]).   He is in today dor DM. He reports that his CBG 126-139.  He is prescribed metformin 500 mg daily and glipizide 5 mg daily. He current A1c is 7.9 down from 12.3 %   He continues to monitor BP at 145-150/82-83. He has occasional dizziness. He continues to have shortness of breath this is improving a little bite. This is slowly but is it working.  Denies headache, visual changes, shortness of breath, dyspnea on exertion, chest pain, nausea, vomiting or any edema.   Past Medical History:  Diagnosis Date   COVID-19 02/06/2019   Diabetes mellitus without complication (HCC)    Hypertension    PE (pulmonary thromboembolism) ([place])    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   Pneumonia    with Covid   Stroke (HCC)    weakness on right side, occurred while he had Covid    Past Surgical History:  Procedure Laterality Date   AMPUTATION Right 08/13/2019   Procedure: AMPUTATION RIGHT FIRST AND SECOND TOES;  Surgeon: Rosetta Posner, MD;  [facility]: [facility] OR;  Service: Vascular;  Laterality: Right;   DIRECT LARYNGOSCOPY Bilateral 04/06/2019   Procedure: MICRO DIRECT LARYNGOSCOPY WITH PROLARYN INJECTION;  Surgeon: Melida Quitter, MD;  Location: [facility] OR;  Service: ENT;  Laterality: Bilateral;   IR GASTROSTOMY TUBE MOD SED  03/10/2019   IR GASTROSTOMY TUBE REMOVAL  04/20/2019    WOUND DEBRIDEMENT Left 08/13/2019   Procedure: AMPUTATION  OF LEFT FIRST TOE, AND DEBRIDEMENT OF LEFT SECOND, THIRD AND FOURTH TOES;  Surgeon: Rosetta Posner, MD;  Location: [facility] OR;  Service: Vascular;  Laterality: Left;    Family History  Problem Relation Age of Onset   Healthy Sister    Healthy Brother     Social History   Socioeconomic History   Marital status: Married    Spouse name: Not on file   Number of children: Not on file   Years of education: Not on file   Highest education level: Not on file  Occupational History   Not on file  Tobacco Use   Smoking status: Former   Smokeless tobacco: Never  Vaping Use   Vaping Use: Never used  Substance and Sexual Activity   Alcohol use: Yes    Comment: rare to occasional   Drug use: Never   Sexual activity: Yes    Birth control/protection: None  Other Topics Concern   Not on file  Social History Narrative   Not on file   Social Determinants of Health   Financial Resource Strain: Not on file  Food Insecurity: Not on file  Transportation Needs: Not on file  Physical Activity: Not on file  Stress: Not on file  Social Connections: Not on file  Intimate Partner Violence: Not on file  Outpatient Medications Prior to Visit  Medication Sig Dispense Refill   atorvastatin (LIPITOR) 20 MG tablet Take 1 tablet (20 mg total) by mouth daily at 6 PM. 90 tablet 3   blood glucose meter kit and supplies KIT Dispense based on patient and insurance preference. Use up to four times daily as directed. (FOR ICD-9 250.00, 250.01). 1 each 0   carboxymethylcellulose 1 % ophthalmic solution Apply 1 drop to eye 3 (three) times daily. 30 mL 0   Continuous Blood Gluc Receiver (FREESTYLE LIBRE 2 READER) DEVI 1 Device by Does not apply route every 14 (fourteen) days. 1 each 1   Continuous Blood Gluc Sensor (FREESTYLE LIBRE 2 SENSOR) MISC 1 application by Does not apply route every 14 (fourteen) days. 2 each 11   dextromethorphan-guaiFENesin  (MUCINEX DM) 30-600 MG 12hr tablet Take 1 tablet by mouth 2 (two) times daily. 30 tablet 0   DULoxetine (CYMBALTA) 60 MG capsule Take 1 capsule (60 mg total) by mouth daily. For nerve pain 30 capsule 11   Elastic Bandages & Supports (MEDICAL COMPRESSION SOCKS) MISC 1 application by Does not apply route every 12 (twelve) hours. 1 each 0   glipiZIDE (GLUCOTROL) 5 MG tablet Take 1 tablet (5 mg total) by mouth 2 (two) times daily before a meal. 180 tablet 3   lisinopril (ZESTRIL) 40 MG tablet Take 1 tablet (40 mg total) by mouth daily. 90 tablet 3   metFORMIN (GLUCOPHAGE) 500 MG tablet Take 1 tablet (500 mg total) by mouth 2 (two) times daily with a meal. 180 tablet 3   metoprolol tartrate (LOPRESSOR) 25 MG tablet Take 1 tablet (25 mg total) by mouth 2 (two) times daily. For blood pressure 60 tablet 5   olopatadine (PATADAY) 0.1 % ophthalmic solution Place 1 drop into both eyes 2 (two) times daily. 5 mL 12   pregabalin (LYRICA) 50 MG capsule Take 1 capsule (50 mg total) by mouth 2 (two) times daily. X 1 week; then 100 mg 2x/day- for nerve pain- 120 capsule 5   traMADol (ULTRAM) 50 MG tablet Take 2 tablets (100 mg total) by mouth 3 (three) times daily as needed for moderate pain or severe pain. Please give 1 month at a time- not 1 week! It's prescribed! 180 tablet 5   amLODipine (NORVASC) 5 MG tablet Take 1 tablet (5 mg total) by mouth daily. 90 tablet 3   No facility-administered medications prior to visit.    No Known Allergies  ROS Review of Systems    Objective:    Physical Exam Vitals reviewed: interprerter.  Constitutional:      Appearance: He is obese.  HENT:     Head: Normocephalic and atraumatic.  Cardiovascular:     Rate and Rhythm: Normal rate and regular rhythm.     Pulses: Normal pulses.     Heart sounds: Normal heart sounds.  Pulmonary:     Effort: Pulmonary effort is normal.     Breath sounds: Normal breath sounds.  Musculoskeletal:        General: Normal range of  motion.     Cervical back: Normal range of motion.     Right lower leg: Edema present.     Left lower leg: No edema.     Comments: cane  Skin:    General: Skin is warm and dry.     Capillary Refill: Capillary refill takes less than 2 seconds.  Neurological:     General: No focal deficit present.     Mental  Status: He is alert and oriented to person, place, and time.  Psychiatric:        Mood and Affect: Mood normal.        Behavior: Behavior normal.        Thought Content: Thought content normal.        Judgment: Judgment normal.    BP (!) 153/93 (BP Location: Right Arm, Patient Position: Sitting, Cuff Size: Normal)    Pulse 78    Temp 98.2 F (36.8 C)    Ht 5' 6"  (1.676 m)    Wt 201 lb (91.2 kg)    SpO2 100%    BMI 32.44 kg/m  Wt Readings from Last 3 Encounters:  01/04/21 201 lb (91.2 kg)  12/15/20 198 lb 9.6 oz (90.1 kg)  11/17/20 198 lb 0.6 oz (89.8 kg)     Health Maintenance Due  Topic Date Due   COVID-19 Vaccine (1) Never done   FOOT EXAM  Never done   OPHTHALMOLOGY EXAM  Never done    There are no preventive care reminders to display for this patient.  Lab Results  Component Value Date   TSH 4.156 03/21/2019   Lab Results  Component Value Date   WBC 6.7 03/06/2020   HGB 14.7 03/06/2020   HCT 43.5 03/06/2020   MCV 85 03/06/2020   PLT 314 03/06/2020   Lab Results  Component Value Date   NA 140 03/06/2020   K 4.3 03/06/2020   CO2 22 08/13/2019   GLUCOSE 90 03/06/2020   BUN 15 03/06/2020   CREATININE 0.77 03/06/2020   BILITOT 0.6 03/06/2020   ALKPHOS 81 03/06/2020   AST 16 03/06/2020   ALT 15 08/13/2019   PROT 7.4 03/06/2020   ALBUMIN 4.7 03/06/2020   CALCIUM 9.5 03/06/2020   ANIONGAP 12 08/13/2019   Lab Results  Component Value Date   CHOL 134 02/17/2019   Lab Results  Component Value Date   HDL 33 (L) 02/17/2019   Lab Results  Component Value Date   LDLCALC 75 02/17/2019   Lab Results  Component Value Date   TRIG 551 (H) 03/01/2019    Lab Results  Component Value Date   CHOLHDL 4.1 02/17/2019   Lab Results  Component Value Date   HGBA1C 7.9 (A) 01/04/2021   HGBA1C 7.9 01/04/2021   HGBA1C 7.9 (A) 01/04/2021   HGBA1C 7.9 (A) 01/04/2021      Assessment & Plan:   Problem List Items Addressed This Visit       Endocrine   Uncontrolled type 2 diabetes mellitus with hyperglycemia (HCC) - Primary Stable  A1c improved with treatment will continue with current regimen   Relevant Orders   HgB A1c (Completed)   POCT URINALYSIS DIP (CLINITEK) (Completed)     Other   Vocal cord paresis Persistent   Relevant Orders   Ambulatory referral to Speech Therapy   Other Visit Diagnoses     Hemoglobin A1C greater than 9%, indicating poor diabetic control       Hypertension, unspecified type     Persistent  Increased Amlodipine 10 mg daily.    Relevant Medications   amLODipine (NORVASC) 10 MG tablet   Hemoglobin A1C between 7% and 9% indicating borderline diabetic control ([place])           Meds ordered this encounter  Medications   amLODipine (NORVASC) 10 MG tablet    Sig: Take 1 tablet (10 mg total) by mouth daily.    Dispense:  90 tablet  Refill:  1    Order Specific Question:   Supervising Provider    Answer:   Tresa Garter [id number]    Follow-up: Return in about 2 months (around 03/07/2021) for Follow up HTN [id number].    Vevelyn Francois, NP

## 2021-01-16 ENCOUNTER — Encounter: Payer: Self-pay | Admitting: Speech Pathology

## 2021-01-16 NOTE — Therapy (Signed)
[place] [address] [place], [place], [address] Phone: [phone]   Fax:  [phone]  Patient Details  Name: Andrew Bruce MRN: [id number] Date of Birth: 1965-11-23 Referring Provider:  No ref. provider found  Encounter Date: 01/16/2021   SPEECH THERAPY DISCHARGE SUMMARY  Visits from Start of Care: 7  Current functional level related to goals / functional outcomes: See goals below   Remaining deficits: dysphonia   Education / Equipment:   Voice hygiene, RMST,, HEP for dysphonia  Pt agrees to discharge. Patient goals were not met. Patient is being discharged due to not returning since the last visit.   SLP Long Term Goals - 01/16/21 1710       SLP LONG TERM GOAL #1   Title pt will report following 3 aspects of vocal conservation program between 4 sessions    Baseline no aspects;    Time 3    Period Weeks   or visits, for all LTGs   Status Not met     SLP LONG TERM GOAL #2   Title Pt will complete exercises for vocal fold adduction with rare min A x3 visits.    Baseline not provided yet; 06-22-19, 07-16-19    Time 3    Period Weeks    Status Not met     SLP LONG TERM GOAL #3   Title pt will use abdominal breathing in 5 minutes simple conversation in 3 sessions    Baseline chest breathing    Time 3    Period Weeks    Status Not met     SLP LONG TERM GOAL #4   Title pt will use WNL vocal effort without strain/pushing for vocalization with rare min A during 3 ST sessions    Baseline strainedpushed until SPL told pt not to do so    Time 3    Period Weeks    Status Not met     SLP LONG TERM GOAL #5   Title pt will rate his voice at least 7/10 (where 10=WNL voice)    Baseline 5/10    Time 4    Period Weeks    Status Not met     SLP LONG TERM GOAL #6   Title Patient will tell SLP 3 signs of aspiration PNA.    Time 4    Period Weeks    Status Not met     SLP LONG TERM GOAL #7   Title pt to  demo vocal fold adduction exercises with modified independence in 2 sessions    Time 1    Period Weeks    Status Not met                Shevon Sian, Annye Rusk, CCC-SLP 01/16/2021, 5:11 PM  [place] [address] [place], [place], [address] Phone: [phone]   Fax:  [phone]

## 2021-01-17 ENCOUNTER — Ambulatory Visit: Payer: Medicaid Other | Attending: Nurse Practitioner | Admitting: Speech Pathology

## 2021-01-17 ENCOUNTER — Other Ambulatory Visit: Payer: Self-pay

## 2021-01-17 DIAGNOSIS — R498 Other voice and resonance disorders: Secondary | ICD-10-CM | POA: Diagnosis not present

## 2021-01-17 NOTE — Therapy (Signed)
[facility] [address] [place], [place], [address] Phone: [phone]   Fax:  [phone]  Speech Language Pathology Evaluation  Patient Details  Name: Andrew Bruce MRN: [id number] Date of Birth: [birth year] Referring Provider (SLP): Dionisio David, NP   Encounter Date: 01/17/2021   End of Session - 01/17/21 1210     Visit Number 1    Number of Visits 9    Date for SLP Re-Evaluation 04/11/21    Authorization Type mediciad - will request initial 3 visits in 3 weeks, hoping he can see ENT before returning    SLP Start Time 43    SLP Stop Time  1150    SLP Time Calculation (min) 48 min    Activity Tolerance Patient tolerated treatment well             Past Medical History:  Diagnosis Date   COVID-19 02/06/2019   Diabetes mellitus without complication ([place])    Hypertension    PE (pulmonary thromboembolism) ([place])    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   Pneumonia    with Covid   Stroke ([place])    weakness on right side, occurred while he had Covid    Past Surgical History:  Procedure Laterality Date   AMPUTATION Right 08/13/2019   Procedure: AMPUTATION RIGHT FIRST AND SECOND TOES;  Surgeon: Rosetta Posner, MD;  Location: [facility];  Service: Vascular;  Laterality: Right;   DIRECT LARYNGOSCOPY Bilateral 04/06/2019   Procedure: MICRO DIRECT LARYNGOSCOPY WITH PROLARYN INJECTION;  Surgeon: Melida Quitter, MD;  Location: [facility];  Service: ENT;  Laterality: Bilateral;   IR GASTROSTOMY TUBE MOD SED  03/10/2019   IR GASTROSTOMY TUBE REMOVAL  04/20/2019   WOUND DEBRIDEMENT Left 08/13/2019   Procedure: AMPUTATION  OF LEFT FIRST TOE, AND DEBRIDEMENT OF LEFT SECOND, THIRD AND FOURTH TOES;  Surgeon: Rosetta Posner, MD;  Location: [facility];  Service: Vascular;  Laterality: Left;    There were no vitals filed for this visit.       SLP Evaluation [facility] - 01/17/21 1121       SLP Visit Information   SLP  Received On 01/17/21    Referring Provider (SLP) Dionisio David, NP    Onset Date January 2021    Medical Diagnosis L vocal fold paralysis      Subjective   Patient/Family Stated Goal "To talk clear and sing in church"      Pain Assessment   Currently in Pain? 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 Original admission 01/26/19- and came to CIR 03/23/19- discharged- 04/20/19.   Has visual difficulties- double vision due to stroke. Dr. 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 His outpt  f/u with Dr. Redmond Baseman 04/29/19 revealed improvement, however, pt has not been scoped by ENT since March 2021.    Mobility Status cane      Balance Screen   Has the  patient fallen in the past 6 months No    Has the patient had a decrease in activity level because of a fear of falling?  No    Is the patient reluctant to leave their home because of a fear of falling?  No      Prior Functional Status   Cognitive/Linguistic Baseline Within functional limits    Type of Home House     Lives With Family;Spouse    Available Support Family    Vocation --   Full time prior to Covid, trying to get disability     Cognition   Overall Cognitive Status Within Functional Limits for tasks assessed      Auditory Comprehension   Overall Auditory Comprehension Appears within functional limits for tasks assessed      Verbal Expression   Overall Verbal Expression Appears within functional limits for tasks assessed      Oral Motor/Sensory  Function   Overall Oral Motor/Sensory Function Appears within functional limits for tasks assessed      Motor Speech   Phonation Aphonic;Hoarse;Breathy    Resonance Within functional limits    Articulation Within functional limitis    Intelligibility Intelligibility reduced    Word 75-100% accurate    Phrase 75-100% accurate    Sentence 50-74% accurate    Conversation 50-74% accurate    Motor Planning Witnin functional limits    Phonation Impaired    Vocal Abuses Habitual Hyperphonia;Habitual Cough/Throat Clear    Tension Present Neck;Shoulder    Volume Soft    Pitch Appropriate   mid 60db"s, intermittent aphonia, harsh, breathy, hoarse                            SLP Education - 01/17/21 1154     Education Details IMST only, IMST instructions; stop push/pull phonatoin. See Dr. Raelyn Number) Educated Patient    Methods Explanation;Demonstration;Verbal cues;Handout    Comprehension Verbalized understanding;Returned demonstration;Verbal cues required;Need further instruction              SLP Short Term Goals - 01/17/21 1214       SLP SHORT TERM GOAL #1   Title Pt will complete RMST with rare min A over 2 sessions    Baseline required frequent mod A and modeling to complete 5 reps of IMST    Time 4    Period Weeks    Status New      SLP SHORT TERM GOAL #2   Title Pt will carryover 2 environmental modifications to reduce requests for repetitions to every 5 utterances at home due to aphonic voice    Baseline no environmetal modifications - reports he gets request for repeats every 3 utterances    Time 4    Period Weeks    Status New              SLP Long Term Goals - 01/17/21 1222       SLP LONG TERM GOAL #1   Title Pt will report 50% reduction in vocal fatigue when talking more than 5 turns in a conversation    Baseline Pt reporting vocal fatigue with 2-3 short utterances    Time 8    Period Weeks    Status New      SLP LONG TERM  GOAL #2   Title Pt will complete HEP for voice with rare min A    Baseline no HEP  Time 8    Period Weeks    Status New      SLP LONG TERM GOAL #3   Title pt will use abdominal breathing in 5 minutes simple conversation in 3 sessions    Baseline chest breathing    Time 8    Period Weeks    Status New      SLP LONG TERM GOAL #4   Title Pt will improve score on Voice-Related Quality of Voice Measure by 3 points    Baseline initial score 10/100 - severe voice problem    Time 8    Period Weeks    Status New              Plan - 01/17/21 1211     Clinical Impression Statement Andrew Bruce is referred by primary care practioner due to ongoing hoarseness and difficulty communicating affecting interactions with friends, family and at church. Voice problem began when pt was hospialized 02/04/19 to 04/20/19 requiring intubation with Covid and subsequent CVA. Today he presents with moderate hoarseness (R49.0) due to vocal fold paralysis and glottic stenosis which occurred during his hopitalization for COVID and CVA. He has not seen ENT since 04/2019. Notified PCP that he needs to see ENT, ideally prior to completing ST  Perceptual Voice Evaluation    Voice Case History        Characteristic voice use: uses voice approximately 5 hours per day for talking with family and friends     Misuse: clavicular breathing pattern, speaking on residual capacity,    Phonotraumatic behaviors:  frequent throat clearing, hyperphonia, glottal attack,   Vocal characteristics:  hoarse,  strained, breathy, reduced vocal intensity, reduced ability to project, vocal fatigue  Objective Voice Measurements   Maximum phonation time for sustained ah: 4.2 seconds   Average fundamental frequency during sustained ah: 185Hz    high for gender         Highest dynamic pitch in conversational speech: 233.1 Hz   Lowest dynamic pitch in conversational speech: 174.6 Hz   Average time patient was able  to sustain /s/:  9.8 seconds   Average time patient was able to sustain /z/: 3.5 seconds   s/z ratio:  (suggestive of dysfunction > 1.0) 2.8   V-RQOL Score:  (46 /50) (10-15 =excellent, 16-20=very good, 21-25 = good, 26-30 = fair, 30+ = poor) Calculated score 10/100  The Voice-Related Quality of Life (V-RQOL) measure is a patient-reported outcome measure assessing voice-related problems. Patient reported severe problems with speaking loudly, running out of air when talking, feeling anxious and depressed due to voice, difficulty using the phone, difficulty working, avoiding social interactions due to voice, needing to repeat self.    Speech Therapy Frequency 1x /week    Duration 8 weeks    Treatment/Interventions Functional tasks;Internal/external aids;Patient/family education;Compensatory strategies;SLP instruction and feedback;Other (comment);Aspiration precaution training;Pharyngeal strengthening exercises;Diet toleration management by SLP;Trials of upgraded texture/liquids    Potential to Achieve Goals Good   pending ENT re-exam   Potential Considerations Financial resources    SLP Home Exercise Plan IMST    Consulted and Agree with Plan of Care Patient             Patient will benefit from skilled therapeutic intervention in order to improve the following deficits and impairments:   Other voice and resonance disorders    Problem List Patient Active Problem List   Diagnosis Date Noted   Nerve pain 12/15/2020   Diplopia 10/20/2020   Impaired balance as late  effect of cerebrovascular accident 07/14/2020   Uncontrolled hypertension 02/18/2020   Impaired functional mobility, balance, gait, and endurance 08/23/2019   Amputation of great toe ([facility]) 08/23/2019   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   Impaired ambulation 06/11/2019   Dependent on walker for ambulation 04/30/2019   Right hemiparesis ([facility]) 04/30/2019   Tachycardia 04/30/2019   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   Paralysis of  left vocal cord 04/29/2019   Acute blood loss anemia    Uncontrolled type 2 diabetes mellitus with hyperglycemia ([facility])    Dry gangrene ([facility])    Leukocytosis    Embolic stroke ([place]) [id number]   Status post tracheostomy ([place])    Pressure injury of skin 03/02/2019   On mechanically assisted ventilation ([place])    Goals of care, counseling/discussion    Palliative care encounter    ARDS (adult respiratory distress syndrome) ([place])    Cerebral embolism with cerebral infarction 02/17/2019   Acute respiratory failure with hypoxia ([facility])    Pneumothorax, right     Tylen Leverich, Annye Rusk, CCC-SLP 01/17/2021, 12:26 PM  [facility] [address] [place], [place], [address] Phone: [phone]   Fax:  [phone]  Name: Cassandra Onderdonk MRN: [id number] Date of Birth: [birth year]

## 2021-01-17 NOTE — Patient Instructions (Signed)
° ° °  You will need to see and Ear, Nose and Throat Doctor (ENT)  - Dr. Jenne Pane is who you saw in the past Please let him know that you are still having trouble with your voice - be honest   With the inhalation tool, breathe all of your air out (exhale fully) then take a quick sip in (1-2 seconds)  then take a break, Repeat 5 sets of 5 reps  Stop the pushing and pulling on the chair exercises   When you are talking at home, try to not have the TV on, step away from noisy appliances, turn off radio etc

## 2021-01-18 ENCOUNTER — Other Ambulatory Visit: Payer: Self-pay | Admitting: Nurse Practitioner

## 2021-01-18 DIAGNOSIS — J3800 Paralysis of vocal cords and larynx, unspecified: Secondary | ICD-10-CM

## 2021-01-22 ENCOUNTER — Ambulatory Visit: Payer: Medicaid Other | Admitting: Physical Medicine and Rehabilitation

## 2021-01-29 ENCOUNTER — Encounter: Payer: Self-pay | Admitting: Physical Medicine and Rehabilitation

## 2021-01-29 ENCOUNTER — Other Ambulatory Visit: Payer: Self-pay

## 2021-01-29 ENCOUNTER — Encounter
Payer: Medicaid Other | Attending: Physical Medicine and Rehabilitation | Admitting: Physical Medicine and Rehabilitation

## 2021-01-29 VITALS — BP 166/89 | HR 71 | Temp 98.0°F | Ht 66.0 in | Wt 205.0 lb

## 2021-01-29 DIAGNOSIS — Z5181 Encounter for therapeutic drug level monitoring: Secondary | ICD-10-CM | POA: Insufficient documentation

## 2021-01-29 DIAGNOSIS — Z79891 Long term (current) use of opiate analgesic: Secondary | ICD-10-CM | POA: Insufficient documentation

## 2021-01-29 DIAGNOSIS — R262 Difficulty in walking, not elsewhere classified: Secondary | ICD-10-CM | POA: Insufficient documentation

## 2021-01-29 DIAGNOSIS — M792 Neuralgia and neuritis, unspecified: Secondary | ICD-10-CM | POA: Diagnosis not present

## 2021-01-29 DIAGNOSIS — G894 Chronic pain syndrome: Secondary | ICD-10-CM | POA: Insufficient documentation

## 2021-01-29 DIAGNOSIS — G8191 Hemiplegia, unspecified affecting right dominant side: Secondary | ICD-10-CM | POA: Insufficient documentation

## 2021-01-29 DIAGNOSIS — E1165 Type 2 diabetes mellitus with hyperglycemia: Secondary | ICD-10-CM | POA: Insufficient documentation

## 2021-01-29 MED ORDER — PREGABALIN 150 MG PO CAPS: 150.0000 mg | ORAL_CAPSULE | Freq: Three times a day (TID) | ORAL | 5 refills | Status: DC

## 2021-01-29 NOTE — Progress Notes (Signed)
Subjective:    Patient ID: Andrew Bruce, male    DOB: 12-15-1965, 56 y.o.   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 Original admission 01/26/19- and came to CIR 03/23/19- discharged- 04/20/19.  Has visual difficulties- double vision due to stroke.  Here for f/u on stroke, etc as above. And nerve pain   Is taking Lyrica-  Leg pain is burning sensation from knee to feet- B/L-  Hasn't helped at all-   When wakes up in the AM and stands up- feels like feet are on fire.   BG's 120-125- per pt. But A1c 7.9- 3 weeks ago. Which doesn't match- is spiking more than he thinks.   Prior was 12.3-      Pain Inventory Average Pain 8 Pain Right Now 8 My pain is burning  LOCATION OF PAIN  back, knee, leg, foot  BOWEL Number of stools per week: 10 Oral laxative use No  Type of laxative . Enema or suppository use No  History of colostomy No  Incontinent No   BLADDER Normal In and out cath, frequency . Able to self cath  . Bladder incontinence No  Frequent urination No  Leakage with coughing No  Difficulty starting stream No  Incomplete bladder emptying No    Mobility use a cane use a walker how many minutes can you walk? 45 ability to climb steps?  yes do you drive?  no  Function not employed: date last employed .  Neuro/Psych depression  Prior Studies Any changes since last visit?  no  Physicians involved in your care Any changes since last visit?  no   Family History  Problem Relation Age of Onset   Healthy Sister    Healthy Brother    Social History    Socioeconomic History   Marital status: Married    Spouse name: Not on file   Number of children: Not on file   Years of education: Not on file   Highest education level: Not on file  Occupational History   Not on file  Tobacco Use   Smoking status: Former   Smokeless tobacco: Never  Vaping Use   Vaping Use: Never used  Substance and Sexual Activity   Alcohol use: Yes    Comment: rare to occasional   Drug use: Never   Sexual activity: Yes    Birth control/protection: None  Other Topics Concern   Not on file  Social History Narrative   Not on file   Social Determinants of Health   Financial Resource Strain: Not on file  Food Insecurity: Not on file  Transportation Needs: Not on file  Physical Activity: Not on file  Stress: Not on file  Social Connections: Not on file   Past Surgical History:  Procedure Laterality Date   AMPUTATION Right 08/13/2019   Procedure: AMPUTATION RIGHT FIRST AND SECOND TOES;  Surgeon: Rosetta Posner, MD;  Location: [facility];  Service: Vascular;  Laterality: Right;   DIRECT LARYNGOSCOPY Bilateral 04/06/2019   Procedure: MICRO DIRECT LARYNGOSCOPY WITH PROLARYN INJECTION;  Surgeon: Melida Quitter,  MD;  Location[facility];  Service: ENT;  Laterality: Bilateral;   IR GASTROSTOMY TUBE MOD SED  03/10/2019   IR GASTROSTOMY TUBE REMOVAL  04/20/2019   WOUND DEBRIDEMENT Left 08/13/2019   Procedure: AMPUTATION  OF LEFT FIRST TOE, AND DEBRIDEMENT OF LEFT SECOND, THIRD AND FOURTH TOES;  Surgeon: Rosetta Posner, MD;  Location[facility];  Service: Vascular;  Laterality: Left;   Past Medical History:  Diagnosis Date   COVID-19 02/06/2019   Diabetes mellitus without complication ([facility])    Hypertension    PE (pulmonary thromboembolism) ([place])    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   Pneumonia    with Covid   Stroke ([facility])    weakness on right side, occurred while he had Covid   BP (!) 166/89    Pulse 71    Temp 98 F (36.7 C) (Oral)    Ht 5\' 6"  (1.676  m)    Wt 205 lb (93 kg)    SpO2 97%    BMI 33.09 kg/m   Opioid Risk Score:   Fall Risk Score:  `1  Depression screen PHQ 2/9  Depression screen [facility] 2/9 01/29/2021 01/04/2021 12/15/2020 10/18/2020 04/21/2020 03/06/2020 06/25/2019  Decreased Interest 1 0 3 0 0 0 0  Down, Depressed, Hopeless 1 0 3 0 0 0 0  PHQ - 2 Score 2 0 6 0 0 0 0     Review of Systems  Musculoskeletal:  Positive for back pain.       Knee pain Leg pain  Psychiatric/Behavioral:  Positive for dysphoric mood.   All other systems reviewed and are negative.     Objective:   Physical Exam  Awake, alert, appropriate, using cane to get around, NAD Amputations healed well on feet B/L .        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 Original admission 01/26/19- and came to CIR 03/23/19- discharged- 04/20/19.  Has visual difficulties- double vision due to stroke.  Here for f/u on stroke as well as nerve pain and DM.    Discussed how Diabetes is the cause of nerve pain- not his stroke- as his BG's become MORE controlled, it actually makes nerve pain worse since nerve wake up more in burning fashion.   2. Is on Glipizide and Metformin- for DM- needs to get a better control to get nerve pain under better controlled. Suggest speaking with Crystal to get better BG control.   3.  Can try to increase Lyrica- or Try Keppra- which helps ~ 60% of patients.  Discussed options- will increase Lyrica to 150 mg 2x/day x 3 days, then increase to 150 mg 3x/day. For nerve pain- sent new Rx for 150 mg capsule 3x/day! But use up what he has of 50 mg  capsules- so 3 capsules 2x/day x 3 days then 3 tabs 3x/day.   4.  Walk up to 30 minutes daily for DM control.   5. F/U in 3 months.    I spent a total of 26 minutes on total  visit- as detailed above- done with interpretor.

## 2021-01-29 NOTE — Patient Instructions (Signed)
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 Original admission 01/26/19- and came to CIR 03/23/19- discharged- 04/20/19.  Has visual difficulties- double vision due to stroke.  Here for f/u on stroke as well as nerve pain and DM.    Discussed how Diabetes is the cause of nerve pain- not his stroke- as his BG's become MORE controlled, it actually makes nerve pain worse since nerve wake up more in burning fashion.   2. Is on Glipizide and Metformin- for DM- needs to get a better control to get nerve pain under better controlled. Suggest speaking with Crystal to get better BG control.   3.  Can try to increase Lyrica- or Try Keppra- which helps ~ 60% of patients.  Discussed options- will increase Lyrica to 150 mg 2x/day x 3 days, then increase to 150 mg 3x/day. For nerve pain- sent new Rx for 150 mg capsule 3x/day! But use up what he has of 50 mg capsules- so 3 capsules 2x/day x 3 days then 3 tabs 3x/day.   4.  Walk up to 30 minutes daily for DM control.   5. F/U in 3 months.

## 2021-01-30 ENCOUNTER — Telehealth: Payer: Self-pay

## 2021-02-05 ENCOUNTER — Ambulatory Visit: Payer: Medicaid Other

## 2021-02-05 ENCOUNTER — Other Ambulatory Visit: Payer: Self-pay

## 2021-02-05 DIAGNOSIS — R498 Other voice and resonance disorders: Secondary | ICD-10-CM

## 2021-02-05 NOTE — Patient Instructions (Signed)
IMST:  Puede inhalar / exhalar el dispositivo sin quitarle la boca. Inhale rpidamente 1-3 segundos y luego exhale a travs del dispositivo   Ejercicios de paja: 5 minutos por la maana y 5 minutos por la noche  Concntrese en su respiracin (inhale por la Clinical cytogeneticist / exhale a travs de la pajita). Tmate tu tiempo    Mervyn Gay del agua:  -Inhalar por la Darene Lamer y exhalar a travs de la pajita -Inhala por la nariz y tararea o "ooo" a travs de la pajita -Elevar el tono de bajo a alto x3 -Paso ms bajo de alto a bajo x3 -Acentos x3-4 seguidos ("woo woo woo")  En el agua:  -Inhale por la Darene Lamer y exhale a travs de la pajita en agua  -Respire por la Clinical cytogeneticist y tararee o "ooo" a travs de la pajita en agua -Elevar el tono de bajo a alto x3 en el agua -Paso ms bajo de alto a bajo x3 en el agua  -Acentos x3-4 seguidos ("woo woo woo")

## 2021-02-05 NOTE — Therapy (Signed)
[facility] [address] [place], [place], [address] Phone: [phone]   Fax:  [phone]  Speech Language Pathology Treatment  Patient Details  Name: Andrew Bruce MRN: [id number] Date of Birth: 03/27/1965 Referring Provider (SLP): Thad Ranger, NP   Encounter Date: 02/05/2021   End of Session - 02/05/21 1526     Visit Number 2    Number of Visits 9    Date for SLP Re-Evaluation 04/11/21    Authorization Type mediciad - will request initial 3 visits in 3 weeks, hoping he can see ENT before returning    SLP Start Time 1530    SLP Stop Time  1615    SLP Time Calculation (min) 45 min    Activity Tolerance Patient tolerated treatment well             Past Medical History:  Diagnosis Date   COVID-19 02/06/2019   Diabetes mellitus without complication (HCC)    Hypertension    PE (pulmonary thromboembolism) (HCC)    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   Pneumonia    with Covid   Stroke (HCC)    weakness on right side, occurred while he had Covid    Past Surgical History:  Procedure Laterality Date   AMPUTATION Right 08/13/2019   Procedure: AMPUTATION RIGHT FIRST AND SECOND TOES;  Surgeon: Larina Earthly, MD;  Location: [facility] OR;  Service: Vascular;  Laterality: Right;   DIRECT LARYNGOSCOPY Bilateral 04/06/2019   Procedure: MICRO DIRECT LARYNGOSCOPY WITH PROLARYN INJECTION;  Surgeon: Christia Reading, MD;  Location: [facility] OR;  Service: ENT;  Laterality: Bilateral;   IR GASTROSTOMY TUBE MOD SED  03/10/2019   IR GASTROSTOMY TUBE REMOVAL  04/20/2019   WOUND DEBRIDEMENT Left 08/13/2019   Procedure: AMPUTATION  OF LEFT FIRST TOE, AND DEBRIDEMENT OF LEFT SECOND, THIRD AND FOURTH TOES;  Surgeon: Larina Earthly, MD;  Location: [facility] OR;  Service: Vascular;  Laterality: Left;    There were no vitals filed for this visit.   Subjective Assessment - 02/05/21 1526     Subjective "so so"    Currently in Pain?  No/denies                   ADULT SLP TREATMENT - 02/05/21 1525       General Information   Behavior/Cognition Alert;Cooperative;Pleasant mood      Treatment Provided   Treatment provided Cognitive-Linquistic      Cognitive-Linquistic Treatment   Treatment focused on Voice;Patient/family/caregiver education    Skilled Treatment Pt endorsed voice is "getting better." Inconsistent carryover of SLP recommendations from evaluation indicated as pt has stopped pull/push per recommendation and completing IMST; however, pt had been completing inhalation for 20 seconds versus 1-2 seconds. SLP provided re-education and modeling to optimize usage of IMT device to maximize benefit. SLP introduced SOVTE exercises today. Reduced breath support exhibited with intermittent to usual SLP cues required to utilize abdominal breath and slow rate of exercise completion. Pt endorsed usual coughing throughout day, especially if he consumes cold water. Presence of phlegm also indicated, in which SLP suggested f/u with MD re: acid reflux managment as pt endorsed hx of acid reflux. Handout provided with education re: IMT and SOVTE.      Assessment / Recommendations / Plan   Plan Continue with current plan of care      Progression Toward Goals   Progression toward goals Progressing toward goals  SLP Education - 02/05/21 1620     Education Details IMST duration, ENT recommendations, SOVTE    Person(s) Educated Patient    Methods Explanation;Demonstration;Verbal cues;Handout    Comprehension Verbalized understanding;Returned demonstration;Need further instruction              SLP Short Term Goals - 02/05/21 1526       SLP SHORT TERM GOAL #1   Title Pt will complete RMST with rare min A over 2 sessions    Baseline required frequent mod A and modeling to complete 5 reps of IMST    Time 4    Period Weeks    Status On-going      SLP SHORT TERM GOAL #2   Title Pt will carryover  2 environmental modifications to reduce requests for repetitions to every 5 utterances at home due to aphonic voice    Baseline no environmetal modifications - reports he gets request for repeats every 3 utterances    Time 4    Period Weeks    Status On-going              SLP Long Term Goals - 02/05/21 1526       SLP LONG TERM GOAL #1   Title Pt will report 50% reduction in vocal fatigue when talking more than 5 turns in a conversation    Baseline Pt reporting vocal fatigue with 2-3 short utterances    Time 8    Period Weeks    Status On-going      SLP LONG TERM GOAL #2   Title Pt will complete HEP for voice with rare min A    Baseline no HEP    Time 8    Period Weeks    Status On-going      SLP LONG TERM GOAL #3   Title pt will use abdominal breathing in 5 minutes simple conversation in 3 sessions    Baseline chest breathing    Time 8    Period Weeks    Status On-going      SLP LONG TERM GOAL #4   Title Pt will improve score on Voice-Related Quality of Voice Measure by 3 points    Baseline initial score 10/100 - severe voice problem    Time 8    Period Weeks    Status On-going              Plan - 02/05/21 1526     Clinical Impression Statement Andrew Bruce is referred by primary care practioner due to ongoing hoarseness and difficulty communicating affecting interactions with friends, family and at church. Today he presents with moderate hoarseness (R49.0) due to vocal fold paralysis and glottic stenosis which occurred during his hopitalization for COVID and CVA. He has not seen ENT since 04/2019. SLP provided contact information for ENT. SLP conducted education and instruction of IMST and SOVTE to address hoarse vocal quality and reduced breath support.    Speech Therapy Frequency 1x /week    Duration 8 weeks    Treatment/Interventions Functional tasks;Internal/external aids;Patient/family education;Compensatory strategies;SLP instruction and feedback;Other  (comment);Aspiration precaution training;Pharyngeal strengthening exercises;Diet toleration management by SLP;Trials of upgraded texture/liquids    Potential to Achieve Goals Good   pending ENT re-exam   Potential Considerations Financial resources    SLP Home Exercise Plan IMST    Consulted and Agree with Plan of Care Patient             Patient will benefit from skilled therapeutic intervention in  order to improve the following deficits and impairments:   Other voice and resonance disorders    Problem List Patient Active Problem List   Diagnosis Date Noted   Nerve pain 12/15/2020   Diplopia 10/20/2020   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 07/14/2020   Uncontrolled hypertension 02/18/2020   Impaired functional mobility, balance, gait, and endurance 08/23/2019   Amputation of great toe (HCC) 08/23/2019   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   Impaired ambulation 06/11/2019   Dependent on walker for ambulation 04/30/2019   Right hemiparesis (HCC) 04/30/2019   Tachycardia 04/30/2019   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   Acute blood loss anemia    Uncontrolled type 2 diabetes mellitus with hyperglycemia (HCC)    Dry gangrene (HCC)    Leukocytosis    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   Status post tracheostomy (HCC)    Pressure injury of skin 03/02/2019   On mechanically assisted ventilation (HCC)    Goals of care, counseling/discussion    Palliative care encounter    ARDS (adult respiratory distress syndrome) (HCC)    Cerebral embolism with cerebral infarction 02/17/2019   Acute respiratory failure with hypoxia (HCC)    Pneumothorax, right     Janann Colonel, CCC-SLP 02/05/2021, 4:22 PM  [facility] [address] [place], [place], [address] Phone: [phone]   Fax:  [phone]   Name: Andrew Bruce MRN: [id number] Date of Birth: 1965-02-16

## 2021-02-12 ENCOUNTER — Other Ambulatory Visit: Payer: Self-pay

## 2021-02-12 ENCOUNTER — Ambulatory Visit: Payer: Medicaid Other

## 2021-02-12 DIAGNOSIS — R498 Other voice and resonance disorders: Secondary | ICD-10-CM | POA: Diagnosis not present

## 2021-02-12 NOTE — Patient Instructions (Addendum)
VOICE CONSERVATION PROGRAM  1. Avoid overuse of voice or excessive use of the voice The vocal cords can become easily fatigued. Try to sort out what is "necessary" versus "unnecessary" talking in your environment. You do not need to STOP talking, but try to limit it as much as possible.  Think of resting your voice just as long as you talk. For example, if you talk for 5 minutes, rest your voice completely for 5 minutes. If your voice feels "tired" during the day, try to rest it as much as possible.  Avoid using an excessively loud voice or shouting/raising your voice When you yell or raise your voice, the vocal cords slam into each other, much like a strong hand clap. This causes irritation, and if this irritation continues, hoarseness may increase. If people in your home talk loudly, ask them to reduce the volume of their voices to help you decrease your volume as well. Sit near or face the person to whom you are speaking.   3. Avoid talking over background noise When talking in background noise, speech automatically has increased loudness, and as in number 2 above, continued loud speech can result in increased hoarseness due to irritation to the vocal cords.  Do not talk over the radio or the TV. Mute them before speaking, go to a quieter place to talk, or sit next to the person with whom you are watching.  4. Talk in a voice that is soft, smooth, and gentle This allows the vocal cords to come together in a gentle way and allows the air being exhaled from the lungs to do most/all of the work when you are speaking.  5. Avoid excessive throat clearing, coughing, and loud laughter During these activities the vocal cords can also be slammed together in a hard way and foster irritation or swelling, which can cause hoarseness. Try taking sips of room temperature/cool water with hard swallows to clear secretions from the throat, instead of throat clearing or coughing. If you absolutely must  clear your throat, do so as gently as possible. If you find yourself clearing your throat or coughing a lot, consult your physician. You will want to laugh. Continue to do so, but softly and gently. Do not laugh loudly for long periods of time because this can increase the chances for vocal fold irritation and thus hoarseness. Lozenges can help reduce the need to clear throat/cough during cold/allergy season.  6. Keep the mouth and throat lubricated Drink at least 8-10 8 oz. glasses of water per day (64-80 oz.). This water can come in the form of drink mixes.  Caffeinated beverages such as colas, coffee, and tea (hot tea AND sweet tea) dry out the vocal cords and then can cause irritation and thus hoarseness.  Drinking water will keep your body hydrated. This will help to decrease secretions in the throat, which cause people to clear their throats or cough. If you are exercising outside (especially during drier weather), try to breathe through your nose as much as possible. If this is not possible, lifting the tongue up behind the upper teeth when breathing through the mouth will add some moisture to the air breathed in past the vocal cords.  7. Avoid mouth breathing - breathe through your nose Your nose serves as a natural filter for dust and dirt particles from the air, and as a humidifier to moisten the air. Vocal cords like to work in moistened, filtered air. When you breathe through your mouth you lose   the air filtering and moistening benefits of breathing through the nose. Be aware of breathing patterns when sitting quietly (e.g., reading or watching TV). Increase your awareness and try to change habits from mouth breathing to nose breathing during those times.  8. Avoid environmental and/or ingested irritants Try to avoid smoke-filled and or dusty environments. These items dry out the vocal cords and cause irritation.  9. Use an air filter if the home is dusty, and/or a humidifier if the air  is dry  This will help to maintain clean, humid air to breathe. Remember, this type of air is what the vocal cords like best.  

## 2021-02-13 NOTE — Therapy (Signed)
[facility] [address] [place], [place], [address] Phone: [phone]   Fax:  [phone]  Speech Language Pathology Treatment  Patient Details  Name: Andrew Bruce MRN: [id number] Date of Birth: 1965/05/22 Referring Provider (SLP): Thad Rangerrystal King, NP   Encounter Date: 02/12/2021   End of Session - 02/12/21 1521     Visit Number 3    Number of Visits 9    Date for SLP Re-Evaluation 04/11/21    Authorization Type mediciad - will request initial 3 visits in 3 weeks, hoping he can see ENT before returning    SLP Start Time 1524    SLP Stop Time  1608    SLP Time Calculation (min) 44 min             Past Medical History:  Diagnosis Date   COVID-19 02/06/2019   Diabetes mellitus without complication (HCC)    Hypertension    PE (pulmonary thromboembolism) (HCC)    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   Pneumonia    with Covid   Stroke (HCC)    weakness on right side, occurred while he had Covid    Past Surgical History:  Procedure Laterality Date   AMPUTATION Right 08/13/2019   Procedure: AMPUTATION RIGHT FIRST AND SECOND TOES;  Surgeon: Larina EarthlyEarly, Todd F, MD;  Location: [facility] OR;  Service: Vascular;  Laterality: Right;   DIRECT LARYNGOSCOPY Bilateral 04/06/2019   Procedure: MICRO DIRECT LARYNGOSCOPY WITH PROLARYN INJECTION;  Surgeon: Christia ReadingBates, Dwight, MD;  [facility];  Service: ENT;  Laterality: Bilateral;   IR GASTROSTOMY TUBE MOD SED  03/10/2019   IR GASTROSTOMY TUBE REMOVAL  04/20/2019   WOUND DEBRIDEMENT Left 08/13/2019   Procedure: AMPUTATION  OF LEFT FIRST TOE, AND DEBRIDEMENT OF LEFT SECOND, THIRD AND FOURTH TOES;  Surgeon: Larina EarthlyEarly, Todd F, MD;  Location: [facility] OR;  Service: Vascular;  Laterality: Left;    There were no vitals filed for this visit.   Subjective Assessment - 02/12/21 1524     Subjective "I'm actually getting better"    Patient is accompained by: Interpreter    Currently in  Pain? Yes    Pain Score 5     Pain Location Throat                   ADULT SLP TREATMENT - 02/12/21 1521       General Information   Behavior/Cognition Alert;Cooperative;Pleasant mood      Treatment Provided   Treatment provided Cognitive-Linquistic      Cognitive-Linquistic Treatment   Treatment focused on Voice;Patient/family/caregiver education    Skilled Treatment Pt c/o sore throat secondary to ongoing cold. SLP educated and instructed vocal hygiene protocol to implement at home. Pt reportedly avoids talking loudly or talking over background noise. Pt endorsed staying hydrated. SLP instructed avoiding throat clearing/talking, not excessively talking, and using humidifer per discussion with education provided. SLP re-educated abdominal breathing, SOVTE, and IMST recommendations. Pt verbalized understanding and able to demonstrate with occasional min A. SLP again recommended pt f/u with ENT as pt has not been re-evaluated since 2021.      Assessment / Recommendations / Plan   Plan Continue with current plan of care      Progression Toward Goals   Progression toward goals Progressing toward goals              SLP Education - 02/12/21 1608     Education Details abdominal breathing, vocal  hygiene protocol, SOVTE, trial head turns for vocal cord adduction    Person(s) Educated Patient    Methods Explanation;Demonstration;Handout;Verbal cues    Comprehension Verbalized understanding;Returned demonstration;Need further instruction              SLP Short Term Goals - 02/12/21 1522       SLP SHORT TERM GOAL #1   Title Pt will complete RMST with rare min A over 2 sessions    Baseline required frequent mod A and modeling to complete 5 reps of IMST    Time 3    Period Weeks    Status On-going      SLP SHORT TERM GOAL #2   Title Pt will carryover 2 environmental modifications to reduce requests for repetitions to every 5 utterances at home due to aphonic voice     Baseline no environmetal modifications - reports he gets request for repeats every 3 utterances    Time 3    Period Weeks    Status On-going              SLP Long Term Goals - 02/12/21 1522       SLP LONG TERM GOAL #1   Title Pt will report 50% reduction in vocal fatigue when talking more than 5 turns in a conversation    Baseline Pt reporting vocal fatigue with 2-3 short utterances    Time 7    Period Weeks    Status On-going      SLP LONG TERM GOAL #2   Title Pt will complete HEP for voice with rare min A    Baseline no HEP    Time 7    Period Weeks    Status On-going      SLP LONG TERM GOAL #3   Title pt will use abdominal breathing in 5 minutes simple conversation in 3 sessions    Baseline chest breathing    Time 7    Period Weeks    Status On-going      SLP LONG TERM GOAL #4   Title Pt will improve score on Voice-Related Quality of Voice Measure by 3 points    Baseline initial score 10/100 - severe voice problem    Time 7    Period Weeks    Status On-going              Plan - 02/12/21 1522     Clinical Impression Statement Andrew Bruce is referred by primary care practioner due to ongoing hoarseness and difficulty communicating affecting interactions with friends, family and at church. Today he presents with moderate hoarseness (R49.0) due to vocal fold paralysis and glottic stenosis which occurred during his hopitalization for COVID and CVA. He has not seen ENT since 04/2019. SLP again recommended pt reach out to preferred ENT to obtain f/u visit. SLP conducted ongoing education and instruction for vocal hygiene protocol, IMST and SOVTE to address hoarse vocal quality and reduced breath support. Pt able to demonstrate witih occasional min A.    Speech Therapy Frequency 1x /week    Duration 8 weeks    Treatment/Interventions Functional tasks;Internal/external aids;Patient/family education;Compensatory strategies;SLP instruction and feedback;Other  (comment);Aspiration precaution training;Pharyngeal strengthening exercises;Diet toleration management by SLP;Trials of upgraded texture/liquids    Potential to Achieve Goals Good   pending ENT re-exam   Potential Considerations Financial resources    SLP Home Exercise Plan IMST    Consulted and Agree with Plan of Care Patient  Patient will benefit from skilled therapeutic intervention in order to improve the following deficits and impairments:   Other voice and resonance disorders    Problem List Patient Active Problem List   Diagnosis Date Noted   Nerve pain 12/15/2020   Diplopia 10/20/2020   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 07/14/2020   Uncontrolled hypertension 02/18/2020   Impaired functional mobility, balance, gait, and endurance 08/23/2019   Amputation of great toe (HCC) 08/23/2019   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   Impaired ambulation 06/11/2019   Dependent on walker for ambulation 04/30/2019   Right hemiparesis (HCC) 04/30/2019   Tachycardia 04/30/2019   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   Acute blood loss anemia    Uncontrolled type 2 diabetes mellitus with hyperglycemia (HCC)    Dry gangrene (HCC)    Leukocytosis    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   Status post tracheostomy (HCC)    Pressure injury of skin 03/02/2019   On mechanically assisted ventilation (HCC)    Goals of care, counseling/discussion    Palliative care encounter    ARDS (adult respiratory distress syndrome) (HCC)    Cerebral embolism with cerebral infarction 02/17/2019   Acute respiratory failure with hypoxia (HCC)    Pneumothorax, right     Janann Colonel, CCC-SLP 02/13/2021, 8:32 AM  [facility] [address] [place], [place], [address] Phone: [phone]   Fax:  [phone]   Name: Andrew Bruce MRN: [id number] Date of Birth: 02-12-1965

## 2021-02-19 ENCOUNTER — Ambulatory Visit: Payer: Medicaid Other | Attending: Nurse Practitioner

## 2021-02-19 ENCOUNTER — Other Ambulatory Visit: Payer: Self-pay

## 2021-02-19 DIAGNOSIS — R498 Other voice and resonance disorders: Secondary | ICD-10-CM | POA: Insufficient documentation

## 2021-02-19 NOTE — Patient Instructions (Addendum)
Hable con su mdico acerca de su pheglm. Es posible que tenga algunos medicamentos que pueden ayudar. Tambin vea si tiene otras opciones para un otorrinolaringlogo, si puede comunicarse con el Dr. Jenne Pane al [phone]

## 2021-02-19 NOTE — Therapy (Signed)
[facility] [address] [place], [place], [address] Phone: [phone]   Fax:  [phone]  Speech Language Pathology Treatment  Patient Details  Name: Andrew Bruce MRN: [id number] Date of Birth: 24-Jun-1965 Referring Provider (SLP): Thad Ranger, NP   Encounter Date: 02/19/2021   End of Session - 02/19/21 1523     Visit Number 4    Number of Visits 9    Date for SLP Re-Evaluation 04/11/21    Authorization Type mediciad    SLP Start Time 1525    SLP Stop Time  1609    SLP Time Calculation (min) 44 min    Activity Tolerance Patient tolerated treatment well             Past Medical History:  Diagnosis Date   COVID-19 02/06/2019   Diabetes mellitus without complication (HCC)    Hypertension    PE (pulmonary thromboembolism) (HCC)    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   Pneumonia    with Covid   Stroke (HCC)    weakness on right side, occurred while he had Covid    Past Surgical History:  Procedure Laterality Date   AMPUTATION Right 08/13/2019   Procedure: AMPUTATION RIGHT FIRST AND SECOND TOES;  Surgeon: Larina Earthly, MD;  Location: [facility] OR;  Service: Vascular;  Laterality: Right;   DIRECT LARYNGOSCOPY Bilateral 04/06/2019   Procedure: MICRO DIRECT LARYNGOSCOPY WITH PROLARYN INJECTION;  Surgeon: Christia Reading, MD;  Location: [facility] OR;  Service: ENT;  Laterality: Bilateral;   IR GASTROSTOMY TUBE MOD SED  03/10/2019   IR GASTROSTOMY TUBE REMOVAL  04/20/2019   WOUND DEBRIDEMENT Left 08/13/2019   Procedure: AMPUTATION  OF LEFT FIRST TOE, AND DEBRIDEMENT OF LEFT SECOND, THIRD AND FOURTH TOES;  Surgeon: Larina Earthly, MD;  Location: [facility] OR;  Service: Vascular;  Laterality: Left;    There were no vitals filed for this visit.   Subjective Assessment - 02/19/21 1526     Subjective "so so"    Patient is accompained by: Interpreter    Currently in Pain? Yes    Pain Score 6     Pain Location Leg                    ADULT SLP TREATMENT - 02/19/21 1523       General Information   Behavior/Cognition Alert;Cooperative;Pleasant mood      Treatment Provided   Treatment provided Cognitive-Linquistic      Cognitive-Linquistic Treatment   Treatment focused on Voice;Patient/family/caregiver education    Skilled Treatment Pt's family member has attempted to contact ENT with no success. Pt was not interested in [facility] or [facility] at this time due to travel distance. SLP suggested MD advise ENT recommendation. Pt does present with some subjective improvements in voice, including improved vocal intensity and clarity although persistent breathiness persists. Pt reportedly completing SOVTE and IMST and declined any further questions. SLP trialed Flow Phonation today, in which usual breaks reduced due to breath support. SLP continued to educate and cue abdominal breath to maximize breath support. SLP attempted oral reading, in which pt unable to accurately complete due to vision. For minimal words visualized, vocal intensity was improved. SLP recommended pt continue IMST, SOVTE, and abdominal breathing exercises and f/u with SLP in 1 month pending ENT appointment.      Assessment / Recommendations / Plan   Plan Continue with current plan of care   f/u in  1 month; pending ENT     Progression Toward Goals   Progression toward goals Progressing toward goals              SLP Education - 02/19/21 1710     Education Details ENT appointment but to breathiness (may need medical intervention), f/u with ENT    Person(s) Educated Patient    Methods Explanation;Demonstration;Handout;Verbal cues    Comprehension Verbalized understanding;Returned demonstration;Need further instruction              SLP Short Term Goals - 02/19/21 1523       SLP SHORT TERM GOAL #1   Title Pt will complete RMST with rare min A over 2 sessions    Baseline required frequent mod A and modeling to complete 5  reps of IMST; 02-19-21    Time 2    Period Weeks    Status On-going      SLP SHORT TERM GOAL #2   Title Pt will carryover 2 environmental modifications to reduce requests for repetitions to every 5 utterances at home due to aphonic voice    Baseline no environmetal modifications - reports he gets request for repeats every 3 utterances    Time 2    Period Weeks    Status On-going              SLP Long Term Goals - 02/19/21 1524       SLP LONG TERM GOAL #1   Title Pt will report 50% reduction in vocal fatigue when talking more than 5 turns in a conversation    Baseline Pt reporting vocal fatigue with 2-3 short utterances    Time 6    Period Weeks    Status On-going      SLP LONG TERM GOAL #2   Title Pt will complete HEP for voice with rare min A    Baseline no HEP    Time 6    Period Weeks    Status On-going      SLP LONG TERM GOAL #3   Title pt will use abdominal breathing in 5 minutes simple conversation in 3 sessions    Baseline chest breathing    Time 6    Period Weeks    Status On-going      SLP LONG TERM GOAL #4   Title Pt will improve score on Voice-Related Quality of Voice Measure by 3 points    Baseline initial score 10/100 - severe voice problem    Time 6    Period Weeks    Status On-going              Plan - 02/19/21 1523     Clinical Impression Statement Andrew Bruce is referred by primary care practioner due to ongoing hoarseness and difficulty communicating affecting interactions with friends, family and at church. Today he presents with improvements in vocal intensity and clarity (limited hoarseness) but ongoing breathy vocal quality due to vocal fold paralysis and glottic stenosis which occurred during his hopitalization for COVID and CVA. He has not seen ENT since 04/2019. SLP again recommended pt reach out to preferred ENT to obtain f/u visit. SLP conducted ongoing education and instruction for IMST and SOVTE to address hoarse vocal quality and  reduced breath support, which is mildly improving. Pt able to demonstrate with occasional min A.    Speech Therapy Frequency 1x /week    Duration 8 weeks    Treatment/Interventions Functional tasks;Internal/external aids;Patient/family education;Compensatory strategies;SLP instruction and feedback;Other (  comment);Aspiration precaution training;Pharyngeal strengthening exercises;Diet toleration management by SLP;Trials of upgraded texture/liquids    Potential to Achieve Goals Good   pending ENT re-exam   Potential Considerations Financial resources    SLP Home Exercise Plan IMST    Consulted and Agree with Plan of Care Patient             Patient will benefit from skilled therapeutic intervention in order to improve the following deficits and impairments:   Other voice and resonance disorders    Problem List Patient Active Problem List   Diagnosis Date Noted   Nerve pain 12/15/2020   Diplopia 10/20/2020   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 07/14/2020   Uncontrolled hypertension 02/18/2020   Impaired functional mobility, balance, gait, and endurance 08/23/2019   Amputation of great toe (HCC) 08/23/2019   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   Impaired ambulation 06/11/2019   Dependent on walker for ambulation 04/30/2019   Right hemiparesis (HCC) 04/30/2019   Tachycardia 04/30/2019   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   Acute blood loss anemia    Uncontrolled type 2 diabetes mellitus with hyperglycemia (HCC)    Dry gangrene (HCC)    Leukocytosis    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   Status post tracheostomy (HCC)    Pressure injury of skin 03/02/2019   On mechanically assisted ventilation (HCC)    Goals of care, counseling/discussion    Palliative care encounter    ARDS (adult respiratory distress syndrome) (HCC)    Cerebral embolism with cerebral infarction 02/17/2019   Acute respiratory failure with hypoxia (HCC)     Pneumothorax, right     Janann Colonel, CCC-SLP 02/19/2021, 5:13 PM  [facility] [address] [place], [place], [address] Phone: [phone]   Fax:  [phone]   Name: Andrew Bruce MRN: [id number] Date of Birth: Sep 12, 1965

## 2021-02-23 NOTE — Telephone Encounter (Signed)
Entered in error

## 2021-03-07 ENCOUNTER — Ambulatory Visit: Payer: Self-pay | Admitting: Nurse Practitioner

## 2021-03-14 ENCOUNTER — Ambulatory Visit (INDEPENDENT_AMBULATORY_CARE_PROVIDER_SITE_OTHER): Payer: Medicaid Other | Admitting: Nurse Practitioner

## 2021-03-14 ENCOUNTER — Encounter: Payer: Self-pay | Admitting: Nurse Practitioner

## 2021-03-14 ENCOUNTER — Other Ambulatory Visit: Payer: Self-pay

## 2021-03-14 VITALS — BP 144/90 | HR 77 | Temp 99.0°F | Ht 66.0 in | Wt 207.4 lb

## 2021-03-14 DIAGNOSIS — R7309 Other abnormal glucose: Secondary | ICD-10-CM

## 2021-03-14 DIAGNOSIS — I10 Essential (primary) hypertension: Secondary | ICD-10-CM

## 2021-03-14 DIAGNOSIS — S90415A Abrasion, left lesser toe(s), initial encounter: Secondary | ICD-10-CM | POA: Diagnosis not present

## 2021-03-14 DIAGNOSIS — E1165 Type 2 diabetes mellitus with hyperglycemia: Secondary | ICD-10-CM | POA: Diagnosis not present

## 2021-03-14 LAB — POCT GLYCOSYLATED HEMOGLOBIN (HGB A1C)
HbA1c POC (<> result, manual entry): 7 % (ref 4.0–5.6)
HbA1c, POC (controlled diabetic range): 7 % (ref 0.0–7.0)
HbA1c, POC (prediabetic range): 7 % — AB (ref 5.7–6.4)
Hemoglobin A1C: 7 % — AB (ref 4.0–5.6)

## 2021-03-14 MED ORDER — DOXYCYCLINE HYCLATE 100 MG PO TABS: 100.0000 mg | ORAL_TABLET | Freq: Two times a day (BID) | ORAL | 0 refills | Status: AC

## 2021-03-14 NOTE — Progress Notes (Signed)
[place], [place]  [address] Phone:  [phone]   Fax:  [phone]    Established Patient Office Visit  Subjective:  Patient ID: Andrew Bruce, male    DOB: 09/30/1965  Age: 56 y.o. MRN: [id number]  CC:  Chief Complaint  Patient presents with   Follow-up    Pt is here today for his 2 month follow up  and to discuss swelling and pain in both feet.    HPI Andrew Bruce presents for follow up. He  has a past medical history of COVID-19 (02/06/2019), Diabetes mellitus without complication ([place]), Hypertension, PE (pulmonary thromboembolism) ([place]), Pneumonia, and Stroke ([place]).   [name] is in today for follow up for Hypertension. The current prescribed treatment is lisinopril 40 mg daily.  He is also on metoprolol 25 mg twice daily.  Compliance is reported and home blood pressure monitoring is done with a range of 130s to 150s over 85. The  DASH diet is being followed. An exercise regimen is not ongoing. There is a goal to establish and maintain blood pressure control. Denies headache, dizziness, visual changes, shortness of breath, dyspnea on exertion, chest pain, nausea, vomiting or any edema.   Foot Ulcer Patient complains of a foot ulcer. The patient has had an ulcer of left 2nd toe for 2 weeks. This has been no at home with treatment.  Past history significant for prior amputation of left great toe and long COVID . The patient reports fever, pain, and redness and denies drainage. Patient does have a history of diabetes mellitus.   Past Medical History:  Diagnosis Date   COVID-19 02/06/2019   Diabetes mellitus without complication (HCC)    Hypertension    PE (pulmonary thromboembolism) (Andrew Bruce)    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   Pneumonia    with Covid   Stroke (Andrew Bruce)    weakness on right side, occurred while he had Covid    Past Surgical History:  Procedure Laterality Date   AMPUTATION Right  08/13/2019   Procedure: AMPUTATION RIGHT FIRST AND SECOND TOES;  Surgeon: Andrew Posner, MD;  Location: [facility] OR;  Service: Vascular;  Laterality: Right;   DIRECT LARYNGOSCOPY Bilateral 04/06/2019   Procedure: MICRO DIRECT LARYNGOSCOPY WITH PROLARYN INJECTION;  Surgeon: Andrew Quitter, MD;  Location: [facility] OR;  Service: ENT;  Laterality: Bilateral;   IR GASTROSTOMY TUBE MOD SED  03/10/2019   IR GASTROSTOMY TUBE REMOVAL  04/20/2019   WOUND DEBRIDEMENT Left 08/13/2019   Procedure: AMPUTATION  OF LEFT FIRST TOE, AND DEBRIDEMENT OF LEFT SECOND, THIRD AND FOURTH TOES;  Surgeon: Andrew Posner, MD;  Location: [facility] OR;  Service: Vascular;  Laterality: Left;    Family History  Problem Relation Age of Onset   Healthy Sister    Healthy Brother     Social History   Socioeconomic History   Marital status: Married    Spouse name: Not on file   Number of children: Not on file   Years of education: Not on file   Highest education level: Not on file  Occupational History   Not on file  Tobacco Use   Smoking status: Former   Smokeless tobacco: Never  Vaping Use   Vaping Use: Never used  Substance and Sexual Activity   Alcohol use: Yes    Comment: rare to occasional   Drug use: Never   Sexual activity: Yes    Birth control/protection:  None  Other Topics Concern   Not on file  Social History Narrative   Not on file   Social Determinants of Health   Financial Resource Strain: Not on file  Food Insecurity: Not on file  Transportation Needs: Not on file  Physical Activity: Not on file  Stress: Not on file  Social Connections: Not on file  Intimate Partner Violence: Not on file    Outpatient Medications Prior to Visit  Medication Sig Dispense Refill   amLODipine (NORVASC) 10 MG tablet Take 1 tablet (10 mg total) by mouth daily. 90 tablet 1   atorvastatin (LIPITOR) 20 MG tablet Take 1 tablet (20 mg total) by mouth daily at 6 PM. 90 tablet 3   blood glucose meter kit and supplies KIT Dispense based  on patient and insurance preference. Use up to four times daily as directed. (FOR ICD-9 250.00, 250.01). 1 each 0   carboxymethylcellulose 1 % ophthalmic solution Apply 1 drop to eye 3 (three) times daily. 30 mL 0   Continuous Blood Gluc Receiver (FREESTYLE LIBRE 2 READER) DEVI 1 Device by Does not apply route every 14 (fourteen) days. 1 each 1   Continuous Blood Gluc Sensor (FREESTYLE LIBRE 2 SENSOR) MISC 1 application by Does not apply route every 14 (fourteen) days. 2 each 11   dextromethorphan-guaiFENesin (MUCINEX DM) 30-600 MG 12hr tablet Take 1 tablet by mouth 2 (two) times daily. 30 tablet 0   DULoxetine (CYMBALTA) 60 MG capsule Take 1 capsule (60 mg total) by mouth daily. For nerve pain 30 capsule 11   Elastic Bandages & Supports (MEDICAL COMPRESSION SOCKS) MISC 1 application by Does not apply route every 12 (twelve) hours. 1 each 0   glipiZIDE (GLUCOTROL) 5 MG tablet Take 1 tablet (5 mg total) by mouth 2 (two) times daily before a meal. 180 tablet 3   lisinopril (ZESTRIL) 40 MG tablet Take 1 tablet (40 mg total) by mouth daily. 90 tablet 3   metFORMIN (GLUCOPHAGE) 500 MG tablet Take 1 tablet (500 mg total) by mouth 2 (two) times daily with a meal. 180 tablet 3   metoprolol tartrate (LOPRESSOR) 25 MG tablet Take 1 tablet (25 mg total) by mouth 2 (two) times daily. For blood pressure 60 tablet 5   olopatadine (PATADAY) 0.1 % ophthalmic solution Place 1 drop into both eyes 2 (two) times daily. 5 mL 12   pregabalin (LYRICA) 150 MG capsule Take 1 capsule (150 mg total) by mouth 3 (three) times daily. - for nerve pain 90 capsule 5   No facility-administered medications prior to visit.    No Known Allergies  ROS Review of Systems    Objective:    Physical Exam Constitutional:      General: He is not in acute distress.    Appearance: He is obese.  HENT:     Head: Normocephalic and atraumatic.  Cardiovascular:     Rate and Rhythm: Normal rate and regular rhythm.     Pulses: Normal  pulses.     Heart sounds: Normal heart sounds.  Pulmonary:     Effort: Pulmonary effort is normal.     Breath sounds: Normal breath sounds.  Musculoskeletal:        General: Tenderness present. No swelling.     Cervical back: Normal range of motion.     Right lower leg: No edema.     Left lower leg: No edema.  Skin:    Capillary Refill: Capillary refill takes less than 2 seconds.  Findings: Erythema present.     Comments: No active bleeding or drainage.   Neurological:     Mental Status: He is alert.  Psychiatric:        Mood and Affect: Mood normal.        Behavior: Behavior normal.        Thought Content: Thought content normal.        Judgment: Judgment normal.    BP (!) 144/90 (BP Location: Left Arm, Cuff Size: Normal)    Pulse 77    Temp 99 F (37.2 C)    Ht 5' 6"  (1.676 m)    Wt 207 lb 6.4 oz (94.1 kg)    SpO2 98%    BMI 33.48 kg/m  Wt Readings from Last 3 Encounters:  03/14/21 207 lb 6.4 oz (94.1 kg)  01/29/21 205 lb (93 kg)  01/04/21 201 lb (91.2 kg)     Health Maintenance Due  Topic Date Due   COVID-19 Vaccine (1) Never done   FOOT EXAM  Never done   OPHTHALMOLOGY EXAM  Never done   HIV Screening  Never done   Hepatitis C Screening  Never done   TETANUS/TDAP  Never done   Zoster Vaccines- Shingrix (1 of 2) Never done   COLONOSCOPY (Pts 45-6yr Insurance coverage will need to be confirmed)  Never done    There are no preventive care reminders to display for this patient.  Lab Results  Component Value Date   TSH 4.156 03/21/2019   Lab Results  Component Value Date   WBC 6.7 03/06/2020   HGB 14.7 03/06/2020   HCT 43.5 03/06/2020   MCV 85 03/06/2020   PLT 314 03/06/2020   Lab Results  Component Value Date   NA 140 03/06/2020   K 4.3 03/06/2020   CO2 22 08/13/2019   GLUCOSE 90 03/06/2020   BUN 15 03/06/2020   CREATININE 0.77 03/06/2020   BILITOT 0.6 03/06/2020   ALKPHOS 81 03/06/2020   AST 16 03/06/2020   ALT 15 08/13/2019   PROT 7.4  03/06/2020   ALBUMIN 4.7 03/06/2020   CALCIUM 9.5 03/06/2020   ANIONGAP 12 08/13/2019   Lab Results  Component Value Date   CHOL 134 02/17/2019   Lab Results  Component Value Date   HDL 33 (L) 02/17/2019   Lab Results  Component Value Date   LDLCALC 75 02/17/2019   Lab Results  Component Value Date   TRIG 551 (H) 03/01/2019   Lab Results  Component Value Date   CHOLHDL 4.1 02/17/2019   Lab Results  Component Value Date   HGBA1C 7.0 (A) 03/14/2021   HGBA1C 7.0 03/14/2021   HGBA1C 7.0 (A) 03/14/2021   HGBA1C 7.0 03/14/2021      Assessment & Plan:   Problem List Items Addressed This Visit       Cardiovascular and Mediastinum   Uncontrolled hypertension Improving No adjustment today Encouraged on going compliance with current medication regimen Encouraged home monitoring and recording BP <130/80 Eating a heart-healthy diet with less salt Encouraged regular physical activity  Recommend Weight loss     Relevant Orders   Comp. Metabolic Panel (12) (Completed)     Endocrine   Uncontrolled type 2 diabetes mellitus with hyperglycemia (HCC) Controlled A1c 7.0 Encourage compliance with current treatment regimen  Encourage regular CBG monitoring Encourage contacting office if excessive hyperglycemia and or hypoglycemia Lifestyle modification with healthy diet (fewer calories, more high fiber foods, whole grains and non-starchy vegetables, lower fat meat and  fish, low-fat diary include healthy oils) regular exercise (physical activity) and weight loss    Relevant Orders   HgB A1c (Completed)   Comp. Metabolic Panel (12) (Completed)   Lipid panel (Completed)   Other Visit Diagnoses     Hypertension, unspecified type    -  Primary   Hemoglobin A1C greater than 9%, indicating poor diabetic control       Abrasion of toe of left foot, initial encounter     Doxycyline 100 mg BID  Due to previous history;amputation, poor circulation and DM Encouraged follow up with  podiatry for ongoing surveillance    Relevant Orders   CBC with Differential/Platelet (Completed)       Meds ordered this encounter  Medications   doxycycline (VIBRA-TABS) 100 MG tablet    Sig: Take 1 tablet (100 mg total) by mouth 2 (two) times daily for 5 days.    Dispense:  10 tablet    Refill:  0    Order Specific Question:   Supervising Provider    Answer:   Tresa Garter [id number]    Follow-up: No follow-ups on file.    Vevelyn Francois, NP

## 2021-03-15 LAB — CBC WITH DIFFERENTIAL/PLATELET
Basophils Absolute: 0 10*3/uL (ref 0.0–0.2)
Basos: 1 %
EOS (ABSOLUTE): 0.2 10*3/uL (ref 0.0–0.4)
Eos: 2 %
Hematocrit: 40.7 % (ref 37.5–51.0)
Hemoglobin: 14.1 g/dL (ref 13.0–17.7)
Immature Grans (Abs): 0.1 10*3/uL (ref 0.0–0.1)
Immature Granulocytes: 1 %
Lymphocytes Absolute: 2.6 10*3/uL (ref 0.7–3.1)
Lymphs: 33 %
MCH: 29 pg (ref 26.6–33.0)
MCHC: 34.6 g/dL (ref 31.5–35.7)
MCV: 84 fL (ref 79–97)
Monocytes Absolute: 0.7 10*3/uL (ref 0.1–0.9)
Monocytes: 9 %
Neutrophils Absolute: 4.3 10*3/uL (ref 1.4–7.0)
Neutrophils: 54 %
Platelets: 326 10*3/uL (ref 150–450)
RBC: 4.87 x10E6/uL (ref 4.14–5.80)
RDW: 12.7 % (ref 11.6–15.4)
WBC: 7.8 10*3/uL (ref 3.4–10.8)

## 2021-03-15 LAB — LIPID PANEL
Chol/HDL Ratio: 4.7 ratio (ref 0.0–5.0)
Cholesterol, Total: 232 mg/dL — ABNORMAL HIGH (ref 100–199)
HDL: 49 mg/dL (ref >39)
LDL Chol Calc (NIH): 141 mg/dL — ABNORMAL HIGH (ref 0–99)
Triglycerides: 236 mg/dL — ABNORMAL HIGH (ref 0–149)
VLDL Cholesterol Cal: 42 mg/dL — ABNORMAL HIGH (ref 5–40)

## 2021-03-15 LAB — COMP. METABOLIC PANEL (12)
AST: 28 IU/L (ref 0–40)
Albumin/Globulin Ratio: 1.8 (ref 1.2–2.2)
Albumin: 4.6 g/dL (ref 3.8–4.9)
Alkaline Phosphatase: 64 IU/L (ref 44–121)
BUN/Creatinine Ratio: 24 — ABNORMAL HIGH (ref 9–20)
BUN: 18 mg/dL (ref 6–24)
Bilirubin Total: 0.2 mg/dL (ref 0.0–1.2)
Calcium: 9.5 mg/dL (ref 8.7–10.2)
Chloride: 102 mmol/L (ref 96–106)
Creatinine, Ser: 0.76 mg/dL (ref 0.76–1.27)
Globulin, Total: 2.5 g/dL (ref 1.5–4.5)
Glucose: 114 mg/dL — ABNORMAL HIGH (ref 70–99)
Potassium: 4.6 mmol/L (ref 3.5–5.2)
Sodium: 140 mmol/L (ref 134–144)
Total Protein: 7.1 g/dL (ref 6.0–8.5)
eGFR: 107 mL/min/{1.73_m2} (ref >59)

## 2021-03-16 ENCOUNTER — Ambulatory Visit: Payer: Medicaid Other | Admitting: Physical Medicine and Rehabilitation

## 2021-03-19 ENCOUNTER — Other Ambulatory Visit: Payer: Self-pay

## 2021-03-19 ENCOUNTER — Ambulatory Visit: Payer: Medicaid Other | Admitting: Speech Pathology

## 2021-03-19 ENCOUNTER — Ambulatory Visit: Payer: Medicaid Other | Attending: Nurse Practitioner

## 2021-03-19 DIAGNOSIS — R498 Other voice and resonance disorders: Secondary | ICD-10-CM | POA: Insufficient documentation

## 2021-03-19 NOTE — Patient Instructions (Addendum)
[facility] -  Dr. Beverlee Nims ?[address], [address] ?[place], [place] [address] ?[phone] ? ?Tengo una referencia para ver a un otorrinolaring?logo de [employer] para la paresia de las cuerdas vocales. Me preguntaba si podr?a programarme aqu?.  ? ?Contin?e con sus ejercicios del habla ?-Uso de un dispositivo de respiraci?n. Inhalaciones cortas de 1-2 segundos ?-Use la pajita dentro y fuera en agua. Practicar soplar aire / burbujas, tararear, [place] / bajar el tono e intentar tararear Neomia Dear canci?n  ?-Descansa tu voz si comienza a ceder  ?

## 2021-03-19 NOTE — Therapy (Signed)
[facility] [address] [place], [place], [address] Phone: [phone]   Fax:  [phone]  Speech Language Pathology Treatment/Discharge  Patient Details  Name: Andrew Bruce MRN: [id number] Date of Birth: Jul 15, 1965 Referring Provider (SLP): Dionisio David, NP   Encounter Date: 03/19/2021   End of Session - 03/19/21 1523     Visit Number 5    Number of Visits 9    Date for SLP Re-Evaluation 04/11/21    Authorization Type mediciad    SLP Start Time 1527    SLP Stop Time  1600    SLP Time Calculation (min) 33 min    Activity Tolerance Patient tolerated treatment well             Past Medical History:  Diagnosis Date   COVID-19 02/06/2019   Diabetes mellitus without complication ([place])    Hypertension    PE (pulmonary thromboembolism) ([place])    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   Pneumonia    with Covid   Stroke ([place])    weakness on right side, occurred while he had Covid    Past Surgical History:  Procedure Laterality Date   AMPUTATION Right 08/13/2019   Procedure: AMPUTATION RIGHT FIRST AND SECOND TOES;  Surgeon: Rosetta Posner, MD;  Location: [facility];  Service: Vascular;  Laterality: Right;   DIRECT LARYNGOSCOPY Bilateral 04/06/2019   Procedure: MICRO DIRECT LARYNGOSCOPY WITH PROLARYN INJECTION;  Surgeon: Melida Quitter, MD;  Location: [facility];  Service: ENT;  Laterality: Bilateral;   IR GASTROSTOMY TUBE MOD SED  03/10/2019   IR GASTROSTOMY TUBE REMOVAL  04/20/2019   WOUND DEBRIDEMENT Left 08/13/2019   Procedure: AMPUTATION  OF LEFT FIRST TOE, AND DEBRIDEMENT OF LEFT SECOND, THIRD AND FOURTH TOES;  Surgeon: Rosetta Posner, MD;  Location: [facility];  Service: Vascular;  Laterality: Left;    There were no vitals filed for this visit.   Subjective Assessment - 03/19/21 1532     Subjective "My voice has improved a lot"    Patient is accompained by: Interpreter    Currently in Pain? Yes     Pain Score 4     Pain Location Foot             SPEECH THERAPY DISCHARGE SUMMARY  Visits from Start of Care: 5  Current functional level related to goals / functional outcomes: Pt presents with some subjective improvements in voice with some improved vocal intensity and clarity; however, limited progress suspected due to questionable vocal cord mobility/closure. Pt was most recently seen by ENT in 2021 and received vocal cord injection; therefore, SLP team requested ENT referral for re-examination to determine need for further medical intervention. Pt verbalized understanding and agreement with SLP recommendations.     Remaining deficits: Requested ENT referral; awaiting appointment    Education / Equipment: Vocal hygiene, SOVTE, IMST, recommendation for ENT   Patient agrees to discharge. Patient goals were partially met. Patient is being discharged due to maximized current rehab potential given need for repeat ENT consult.      ADULT SLP TREATMENT - 03/19/21 1523       General Information   Behavior/Cognition Alert;Cooperative;Pleasant mood      Treatment Provided   Treatment provided Cognitive-Linquistic      Cognitive-Linquistic Treatment   Treatment focused on Voice;Patient/family/caregiver education    Skilled Treatment Pt reports subjective improvements in voice but continues to complain of breathy vocal quality and lack of  air. SLP educated and demonstrated impact of vocal cord immobility on voice. Pt verbalized understanding. SLP provided additional information for ENT. SLP recommended continuation for IMST, SOVTE, and vocal hygiene protocol. SLP recommended f/u with ENT and new referral for ST after ENT assessment to determine if medical intervention needed. Pt verbalized understanding and agreement.      Assessment / Recommendations / Plan   Plan Discharge SLP treatment due to (comment)   Recommend ENT to determine if medical intervention warranted     Progression  Toward Goals   Progression toward goals Goals partially met, education completed, patient discharged from [facility] - 03/19/21 1607     Education Details ENT recommendations, continue recommended speech exercises in mean time    Person(s) Educated Patient    Methods Explanation;Demonstration;Handout    Comprehension Verbalized understanding;Returned demonstration              SLP Short Term Goals - 03/19/21 1524       SLP SHORT TERM GOAL #1   Title Pt will complete RMST with rare min A over 2 sessions    Baseline required frequent mod A and modeling to complete 5 reps of IMST; 02-19-21, 03-19-21    Time --    Period --    Status Achieved      SLP SHORT TERM GOAL #2   Title Pt will carryover 2 environmental modifications to reduce requests for repetitions to every 5 utterances at home due to aphonic voice    Baseline no environmetal modifications - reports he gets request for repeats every 3 utterances; 03-19-21    Time --    Period --    Status Partially Met              SLP Long Term Goals - 03/19/21 1610       SLP LONG TERM GOAL #1   Title Pt will report 50% reduction in vocal fatigue when talking more than 5 turns in a conversation    Baseline Pt reporting vocal fatigue with 2-3 short utterances    Status Partially Met      SLP LONG TERM GOAL #2   Title Pt will complete HEP for voice with rare min A    Baseline no HEP    Status Achieved      SLP LONG TERM GOAL #3   Title pt will use abdominal breathing in 5 minutes simple conversation in 3 sessions    Baseline chest breathing    Status Achieved      SLP LONG TERM GOAL #4   Title Pt will improve score on Voice-Related Quality of Voice Measure by 3 points    Baseline initial score 10/100 - severe voice problem    Status Partially Met              Plan - 03/19/21 1524     Clinical Impression Statement Andrew Bruce is referred by primary care practioner due to vocal cord  paresis. Today he presents with some improvements in vocal intensity and clarity (limited hoarseness) but ongoing breathy vocal quality seemingly due to vocal fold paralysis and glottic stenosis which occurred during his hopitalization for COVID and CVA. He has not seen ENT since 04/2019. SLP again recommended pt reach out to ENT to obtain f/u visit as medical intervention may be warranted. SLP recommended continuation of abdominal breathing, IMST, and SOVTE. Pt may benefit from additional ST  services pending ENT consult and intervention.    Speech Therapy Frequency 1x /week    Duration 8 weeks    Treatment/Interventions Functional tasks;Internal/external aids;Patient/family education;Compensatory strategies;SLP instruction and feedback;Other (comment);Aspiration precaution training;Pharyngeal strengthening exercises;Diet toleration management by SLP;Trials of upgraded texture/liquids    Potential to Achieve Goals Good   pending ENT re-exam   Potential Considerations Financial resources    SLP Home Exercise Plan IMST    Consulted and Agree with Plan of Care Patient             Patient will benefit from skilled therapeutic intervention in order to improve the following deficits and impairments:   Other voice and resonance disorders    Problem List Patient Active Problem List   Diagnosis Date Noted   Nerve pain 12/15/2020   Diplopia 10/20/2020   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 07/14/2020   Uncontrolled hypertension 02/18/2020   Impaired functional mobility, balance, gait, and endurance 08/23/2019   Amputation of great toe ([facility]) 08/23/2019   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   Impaired ambulation 06/11/2019   Dependent on walker for ambulation 04/30/2019   Right hemiparesis ([facility]) 04/30/2019   Tachycardia 04/30/2019   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   Acute blood loss anemia    Uncontrolled type 2 diabetes mellitus with  hyperglycemia ([facility])    Dry gangrene ([facility])    Leukocytosis    Embolic stroke ([facility]) 88/89/1694   Status post tracheostomy ([facility])    Pressure injury of skin 03/02/2019   On mechanically assisted ventilation ([facility])    Goals of care, counseling/discussion    Palliative care encounter    ARDS (adult respiratory distress syndrome) ([facility])    Cerebral embolism with cerebral infarction 02/17/2019   Acute respiratory failure with hypoxia ([facility])    Pneumothorax, right     Marzetta Board, CCC-SLP 03/19/2021, 4:11 PM  [facility] [address] [place], [place], [address] Phone: [phone]   Fax:  [phone]   Name: Andrew Bruce MRN: [id number] Date of Birth: [birth year]

## 2021-03-20 IMAGING — DX DG ABD PORTABLE 1V
1 series · 1 of 1 positions shown · non-contrast
Comparison: None.

CLINICAL DATA: OG tube placement

EXAM:
PORTABLE ABDOMEN - 1 VIEW

[abdomen]
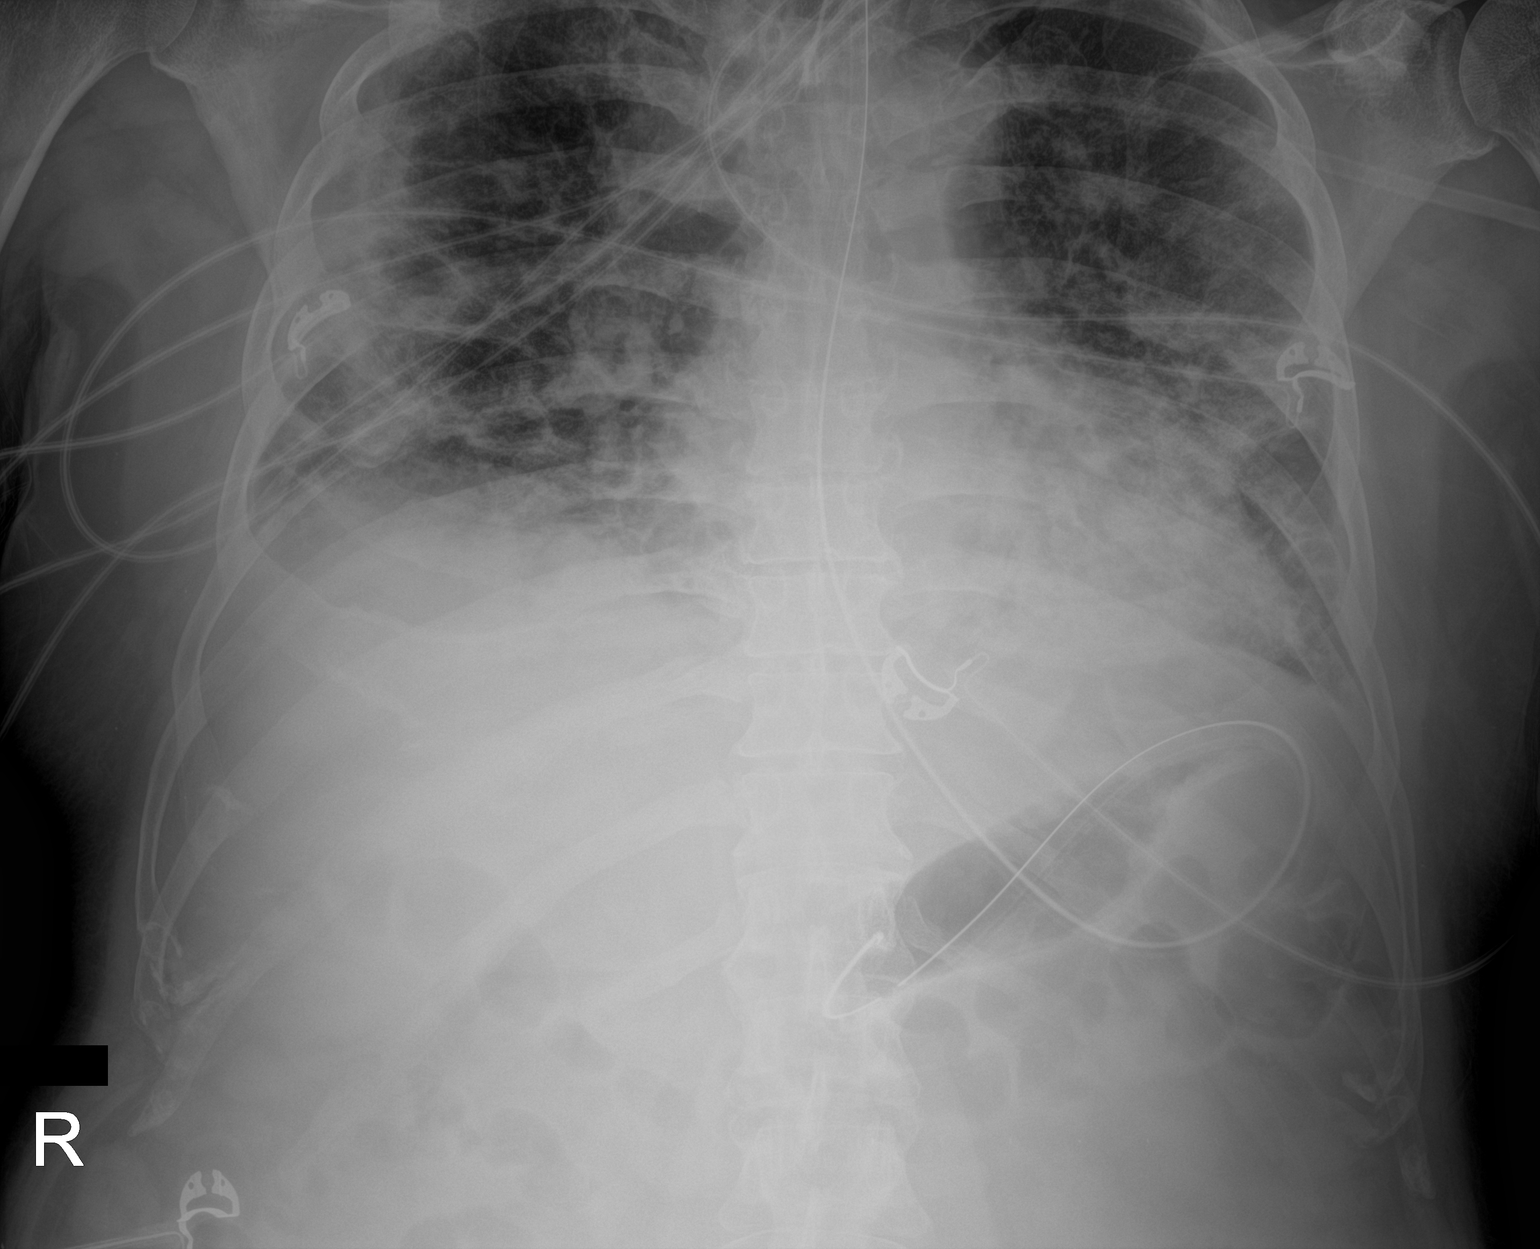

[1 of 1 positions shown; findings below may reference images not displayed]

FINDINGS: 2266 hours. The NG tube is coiled in the stomach. Bowel gas pattern
is nonspecific. The visualized lower lung show patchy bilateral
airspace disease.
IMPRESSION: NG tube is coiled in the stomach with the tip in the antrum. Stomach
is nondistended.

## 2021-03-20 IMAGING — DX DG CHEST 1V PORT
1 series · 1 of 1 positions shown · non-contrast
Comparison: 02/06/2019

CLINICAL DATA: ETT placement.

EXAM:
PORTABLE CHEST 1 VIEW

[chest]
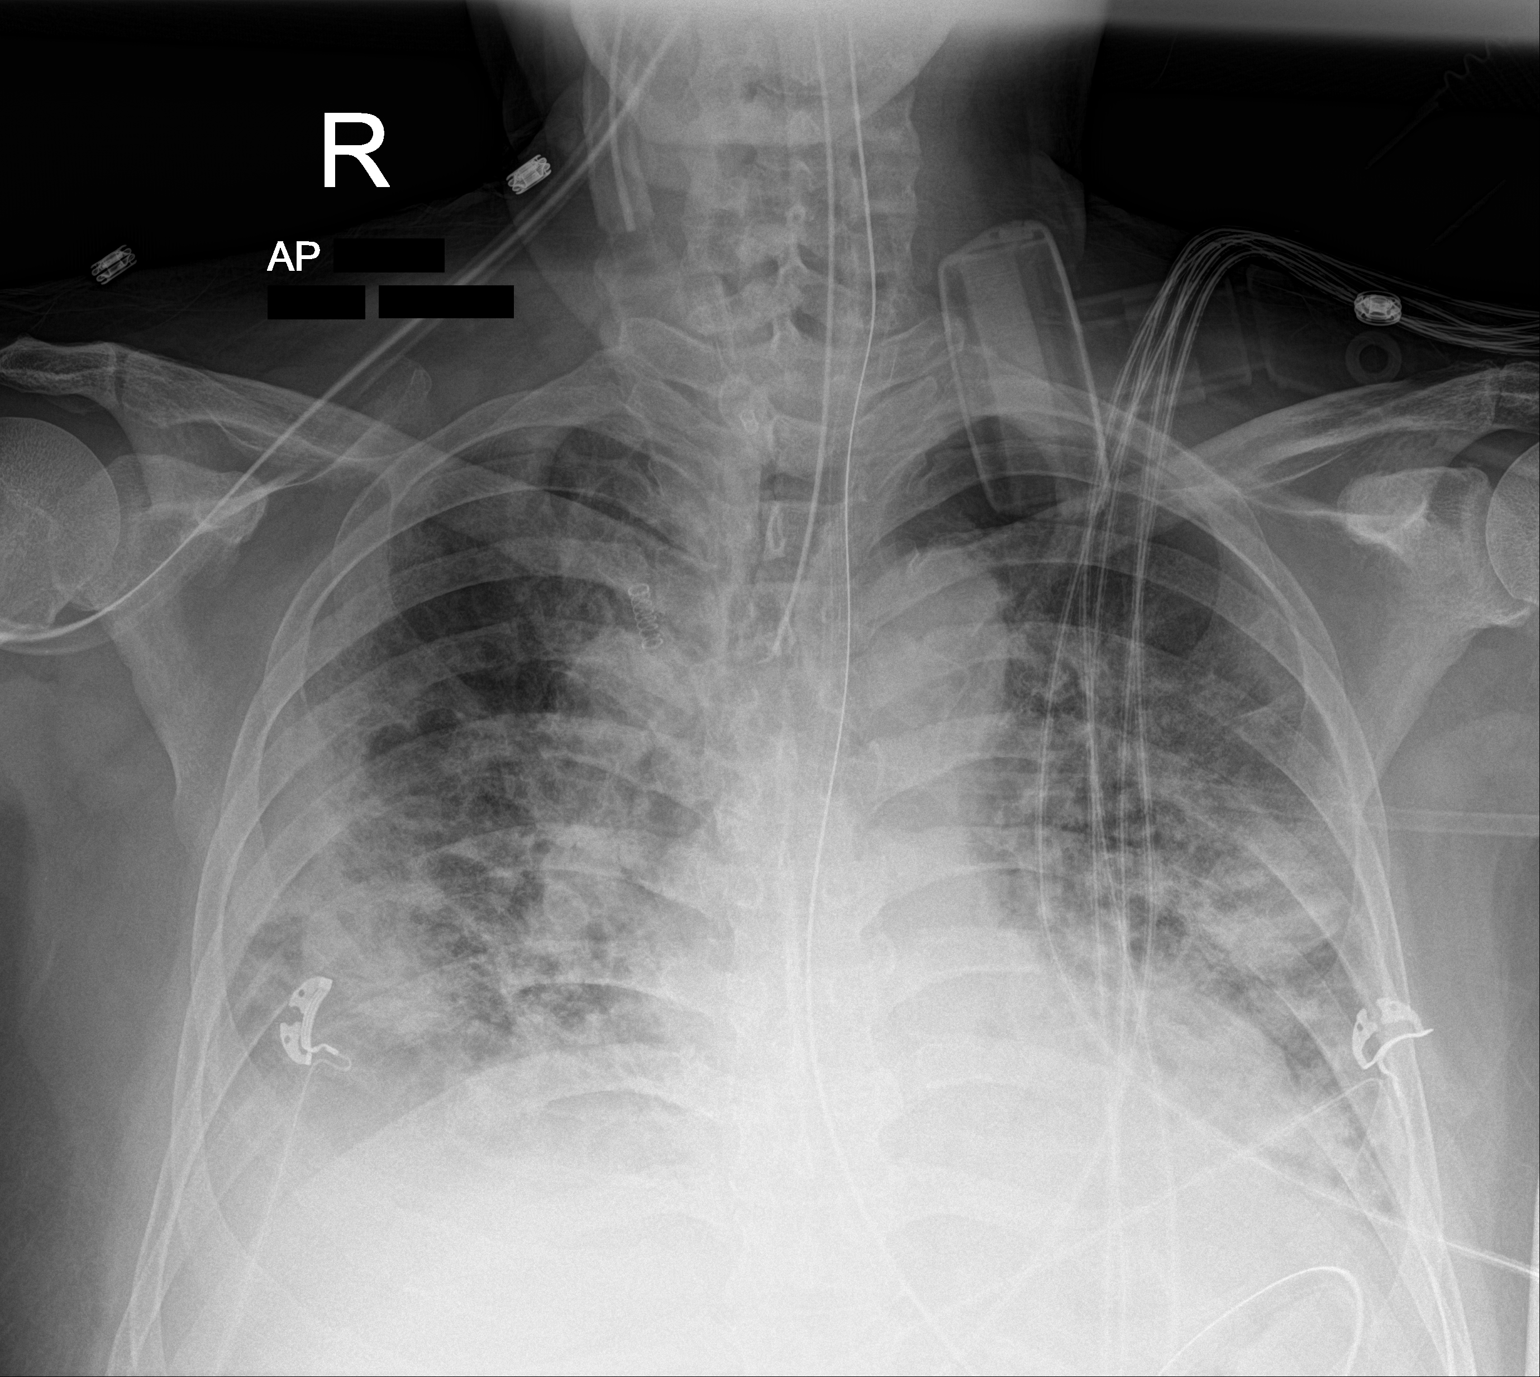

[1 of 1 positions shown; findings below may reference images not displayed]

FINDINGS: 6666 hours. Endotracheal tube tip is 2.5 cm above the base of the
carina. The NG tube passes into the stomach although the distal tip
position is not included on the film. The bilateral patchy airspace
disease with peripheral predominance is similar to prior.
Cardiopericardial silhouette is at upper limits of normal for size.
Lung volumes remain low. The visualized bony structures of the
thorax are intact. Telemetry leads overlie the chest.
IMPRESSION: 1. Endotracheal tube tip is 2.5 cm above the base of the carina.
2. Similar appearance of patchy bilateral airspace disease
compatible with multifocal pneumonia.

## 2021-03-21 IMAGING — DX DG CHEST 1V PORT
1 series · 1 of 1 positions shown · non-contrast
Comparison: 02/08/2019

CLINICAL DATA: Chest tube placement

EXAM:
PORTABLE CHEST 1 VIEW

[chest]
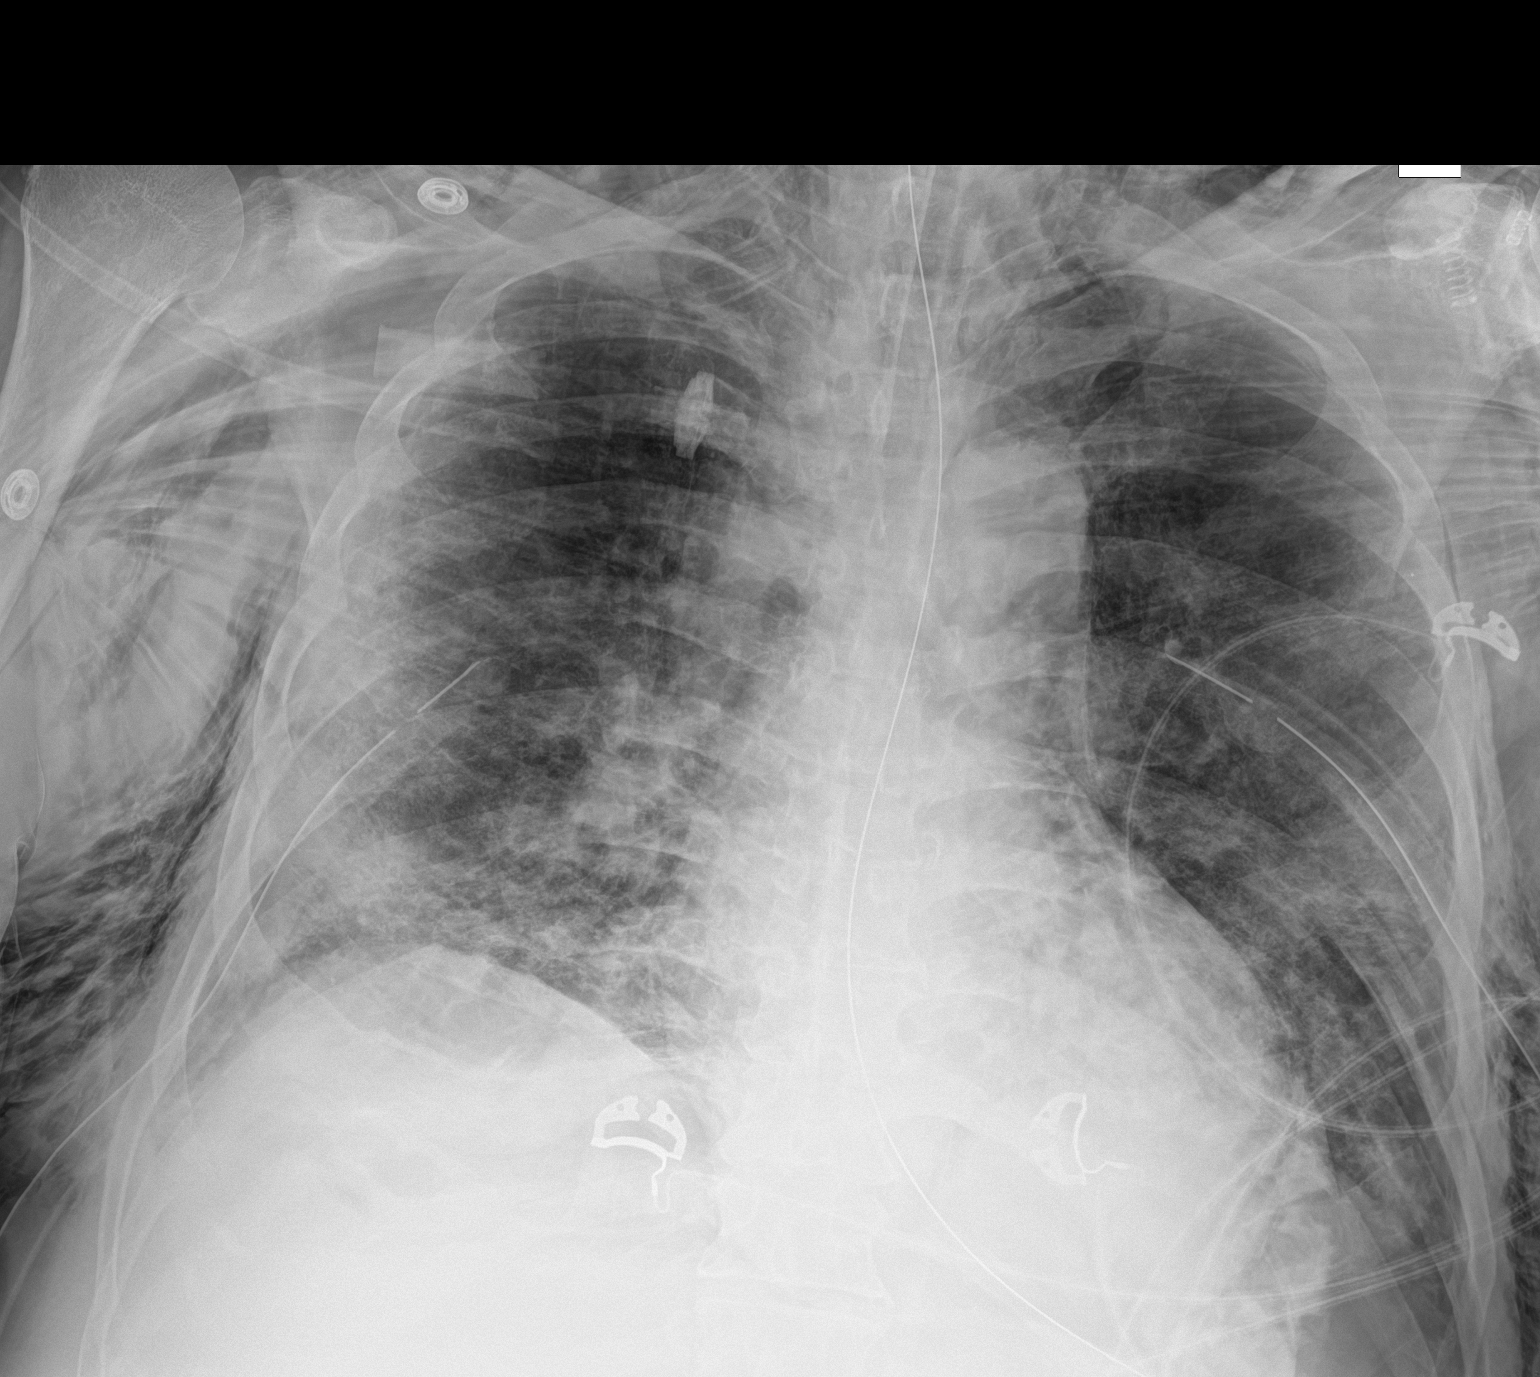

[1 of 1 positions shown; findings below may reference images not displayed]

FINDINGS: Interval placement of right chest tube in good position. Decreased
right pneumothorax which is now small.

Left chest tube placed in the interval. No residual pneumothorax on
the left.

Extensive subcutaneous emphysema unchanged. NG tube enters the
stomach. Endotracheal tube in good position.

Diffuse bilateral airspace disease with progression in the lung
bases. No effusion
IMPRESSION: Bilateral chest tube placement. Small right pneumothorax. No
residual pneumothorax on the left. Extensive subcutaneous emphysema

Diffuse bilateral airspace disease. Progression of bibasilar
infiltrates/atelectasis since earlier today.

## 2021-03-22 IMAGING — DX DG CHEST 1V
1 series · 1 of 1 positions shown · non-contrast
Comparison: Earlier same day

CLINICAL DATA: Pneumothorax, tube flushed

EXAM:
CHEST  1 VIEW

[chest ap]
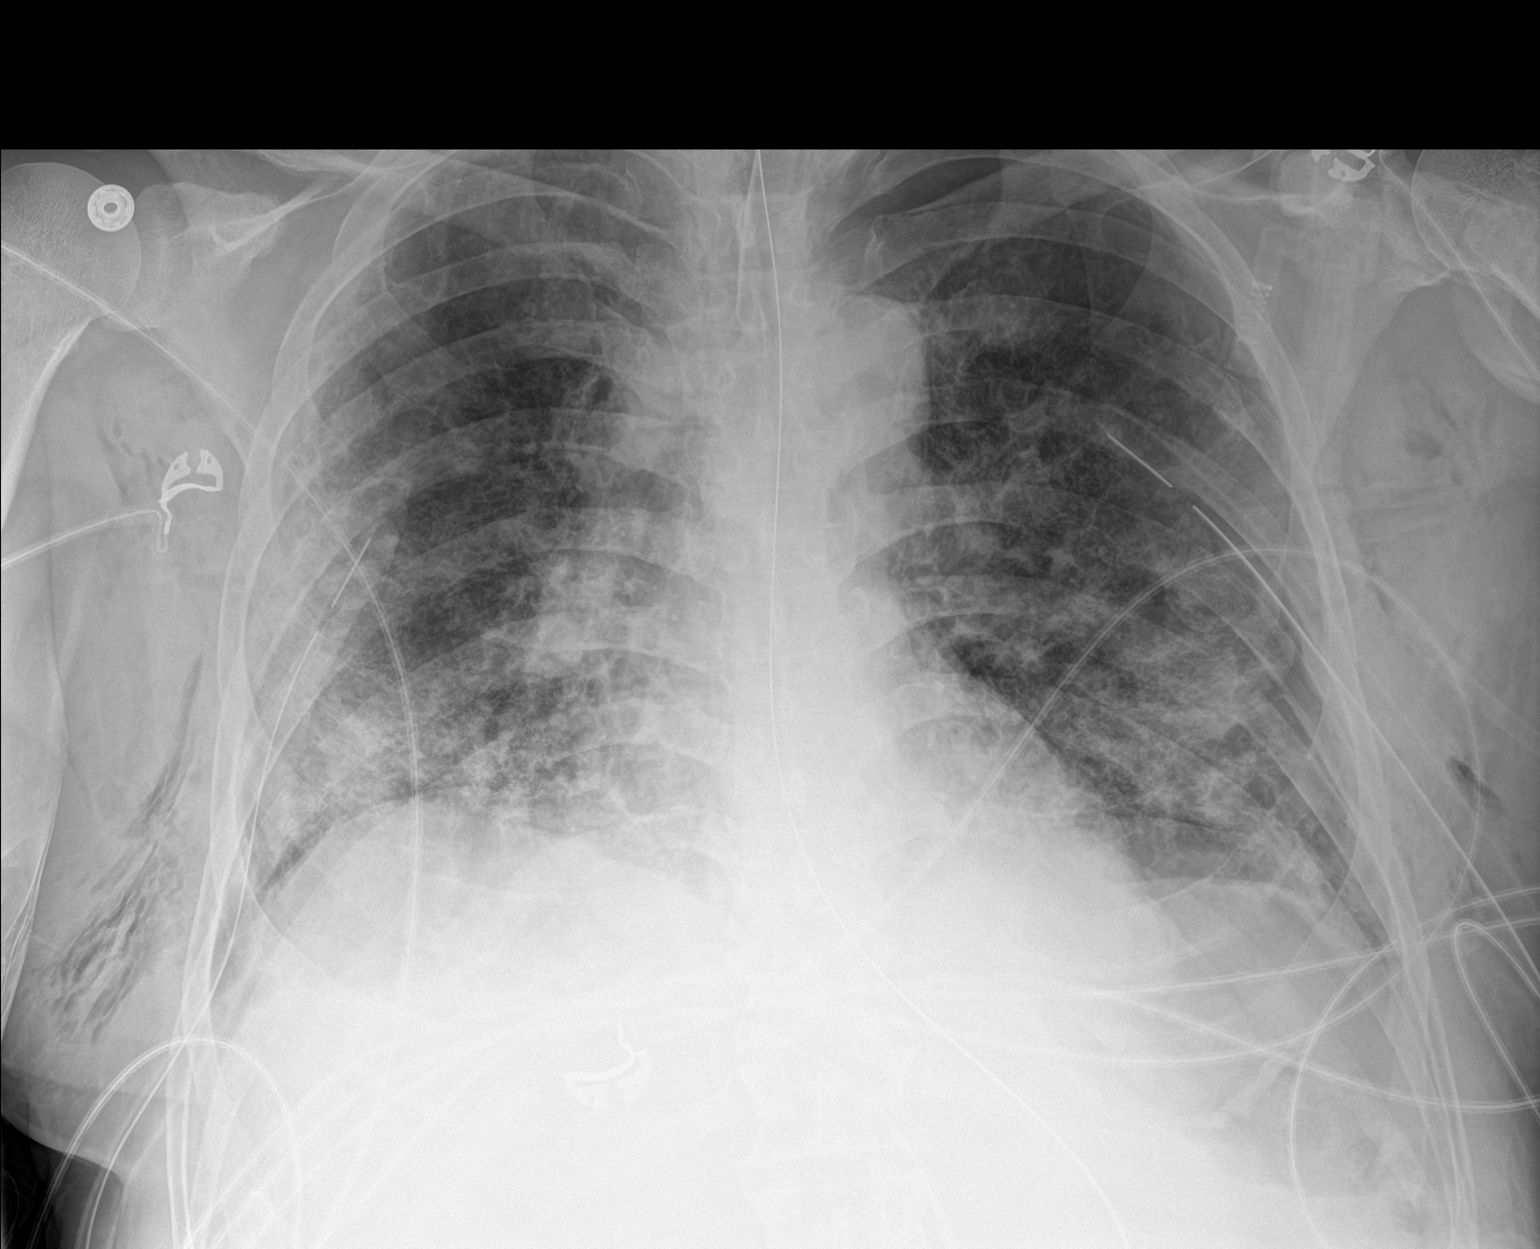

[1 of 1 positions shown; findings below may reference images not displayed]

FINDINGS: Bilateral chest tubes, endotracheal tube, and enteric tube are again
present.

Possible small residual left pneumothorax. No right pneumothorax.
Persistent bilateral opacities. Stable cardiomediastinal contours.
Bilateral chest wall emphysema.
IMPRESSION: Possible small residual left pneumothorax.  Stable lung aeration.

## 2021-03-26 ENCOUNTER — Telehealth: Payer: Self-pay

## 2021-03-26 NOTE — Telephone Encounter (Signed)
I contact patient again to discuss lab results we which we already talked about on 03/21/21.  ?

## 2021-03-26 NOTE — Telephone Encounter (Signed)
Pt is SPANISH however needs someone to call with Lab results ?

## 2021-04-29 IMAGING — MR MR HEAD W/O CM
8 of 11 series · 27 of 48 positions shown · non-contrast
Comparison: Head CT February 16, 2019

CLINICAL DATA: Encephalopathy. Dysphagia. Acute hypoxic respiratory
failure related to [id number] pneumonia with secondary thromboembolic
events.

EXAM:
MRI HEAD WITHOUT CONTRAST
TECHNIQUE: Multiplanar, multiecho pulse sequences of the brain and surrounding
structures were obtained without intravenous contrast.

[Series 2: DWI · axial · 3.0mm · 0.94mm/px · z∈[-65,+81]mm · 7 of 100 slices shown (1 of 2)]
[im 1/100]
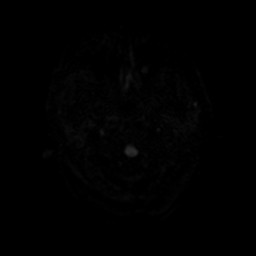
[im 17/100]
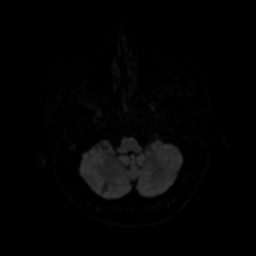
[im 34/100]
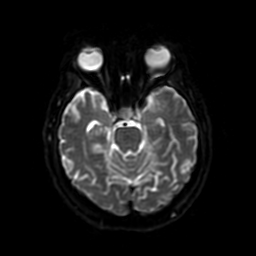
[im 50/100]
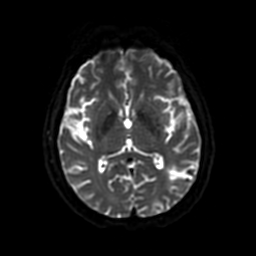
[im 67/100]
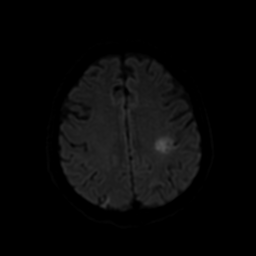
[im 83/100]
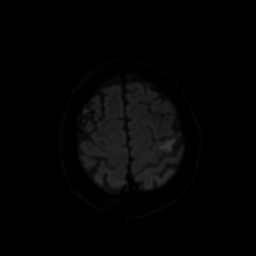
[im 100/100]
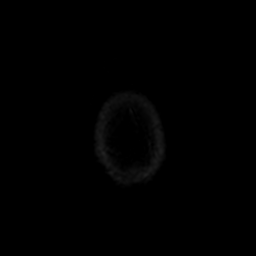

[Series 3: DWI · coronal · 4.0mm · 0.94mm/px · 6 of 70 slices shown (2 of 2)]
[im 1/70]
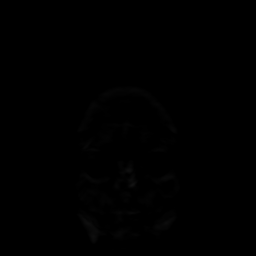
[im 14/70]
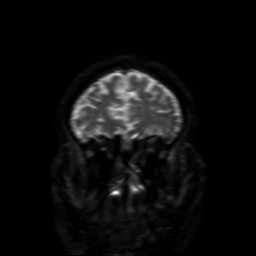
[im 28/70]
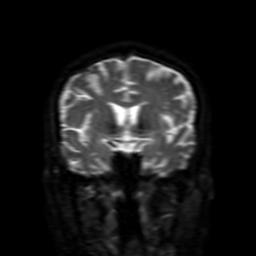
[im 42/70]
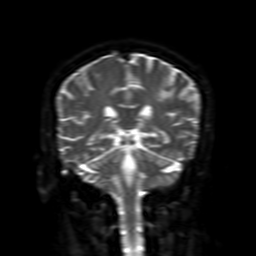
[im 56/70]
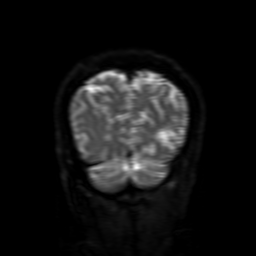
[im 70/70]
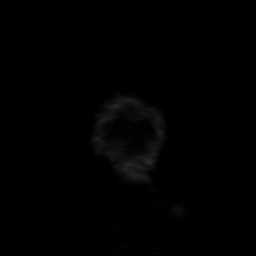

[Series 4: FLAIR · sagittal · 5.0mm · 0.23mm/px · 2 of 25 slices shown (1 of 2)]
[im 1/25]
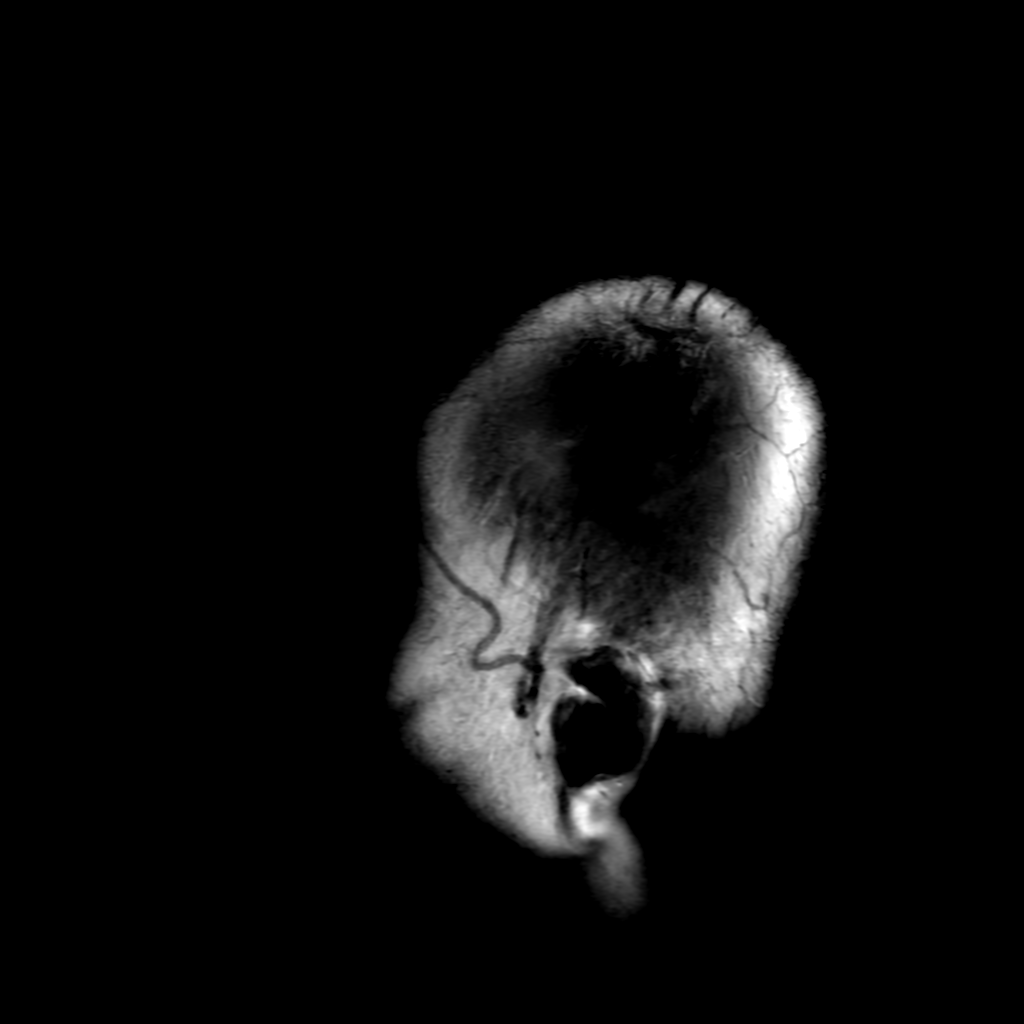
[im 25/25]
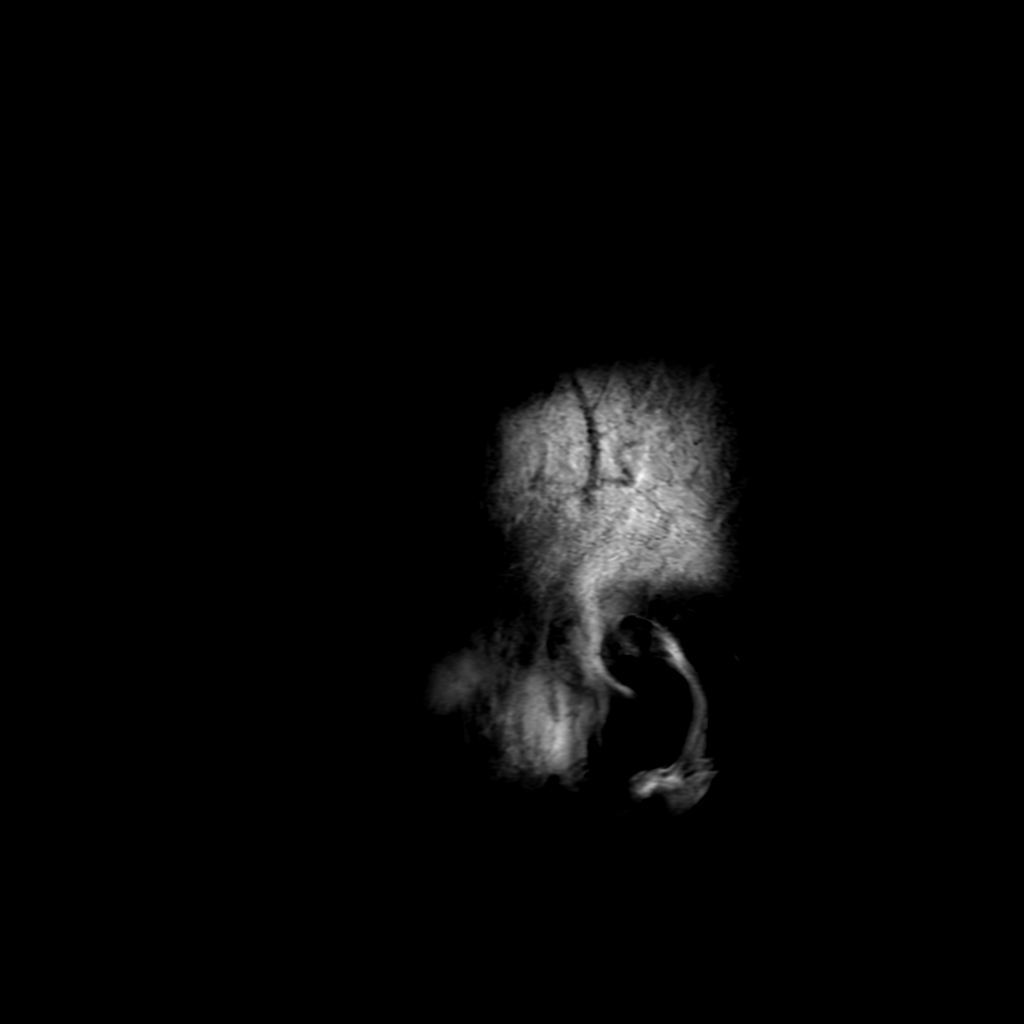

[Series 7: T2 · axial · 5.0mm · 0.47mm/px · z∈[-65,+84]mm · 2 of 26 slices shown (1 of 2)]
[im 1/26]
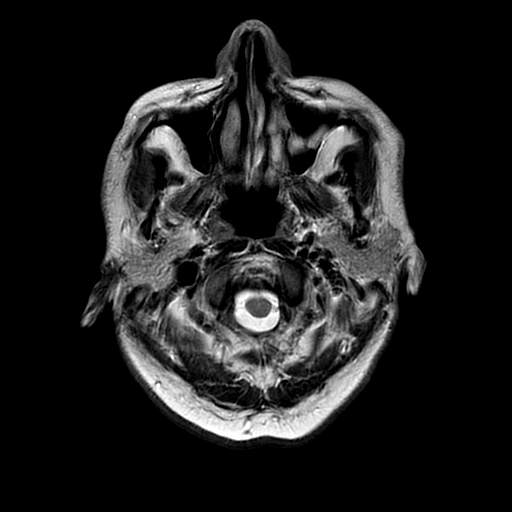
[im 26/26]
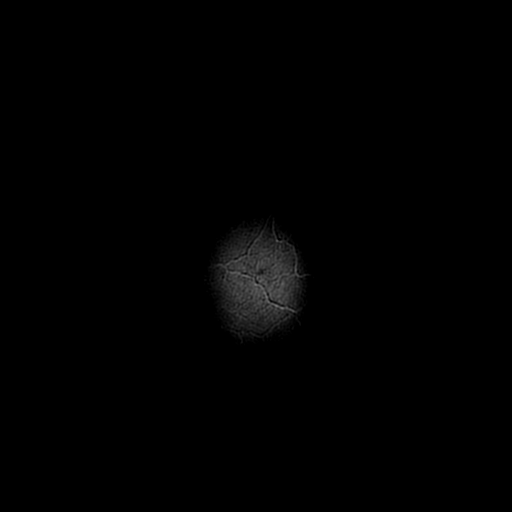

[Series 8: FLAIR · axial · 5.0mm · 0.43mm/px · z∈[-65,+84]mm · 2 of 26 slices shown (2 of 2)]
[im 1/26]
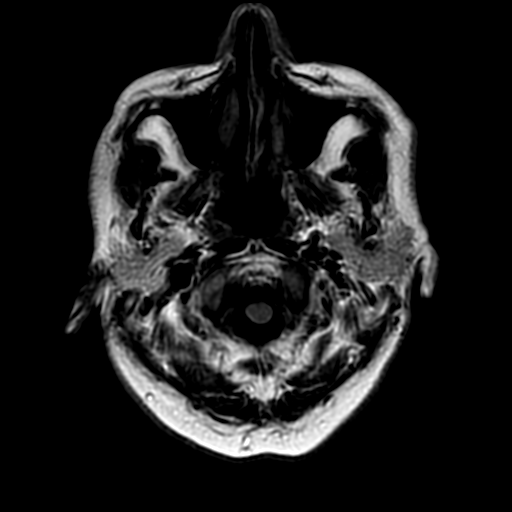
[im 26/26]
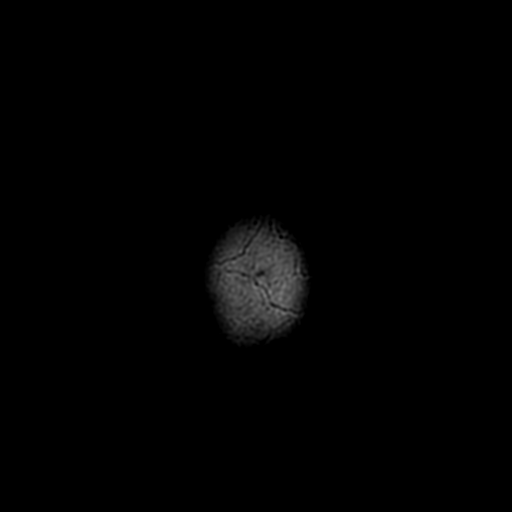

[Series 11: T2 · coronal · 5.0mm · 0.47mm/px · 1 of 27 slices shown (2 of 2)]
[im 1/27]
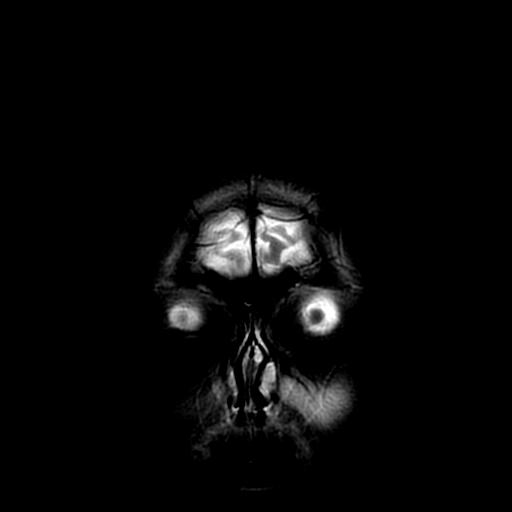

[Series 250: ADC · axial · 3.0mm · 0.94mm/px · z∈[-65,+81]mm · 4 of 49 slices shown (1 of 2)]
[im 1/49]
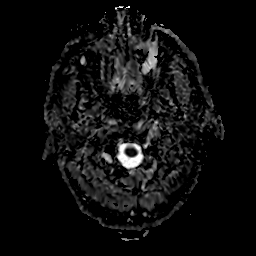
[im 17/49]
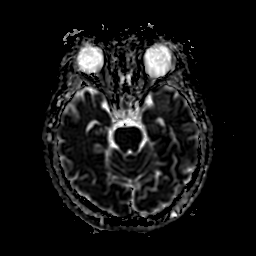
[im 33/49]
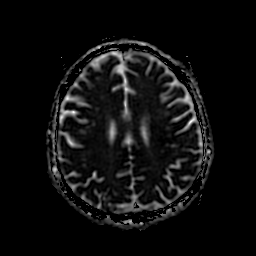
[im 49/49]
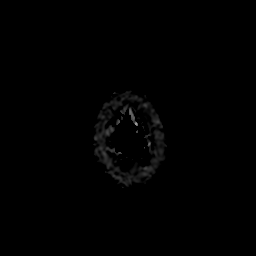

[Series 350: ADC · coronal · 4.0mm · 0.94mm/px · 3 of 35 slices shown (2 of 2)]
[im 1/35]
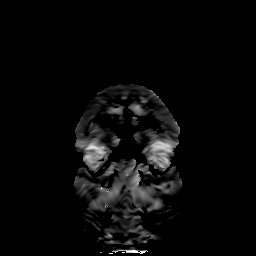
[im 18/35]
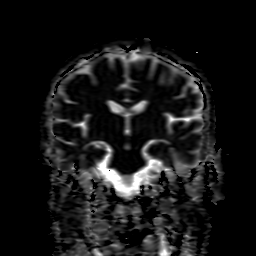
[im 35/35]
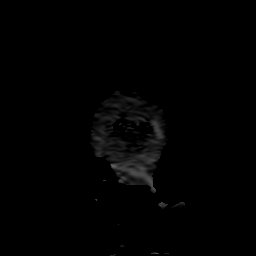

[27 of 48 positions shown; findings below may reference images not displayed]

FINDINGS: Brain: No acute infarction, hemorrhage, hydrocephalus, extra-axial
collection or mass lesion.

Subacute infarcts are noted scattered throughout the brain
parenchyma including bilateral frontoparietal regions, splenium of
the corpus callosum on the right side and most significantly left
occipital lobe. There are also chronic bilateral cerebellar
infarcts. Susceptibility artifact is noted on the susceptibility
weighted images associated with these infarcts, consistent with
hemosiderin deposit.

Vascular: Normal flow voids.

Skull and upper cervical spine: Normal marrow signal.

Sinuses/Orbits: Mucosal thickening in the left maxillary sinus.

Other: Bilateral mastoid effusion.
IMPRESSION: 1. No acute intracranial abnormality identified.
2. Subacute infarcts scattered throughout the brain parenchyma
including bilateral frontoparietal regions, splenium of corpus
callosum on the right side and most significantly left occipital
lobe.
3. Chronic bilateral cerebellar infarcts.

## 2021-04-30 ENCOUNTER — Ambulatory Visit: Payer: Self-pay | Admitting: Nurse Practitioner

## 2021-04-30 ENCOUNTER — Encounter
Payer: Medicaid Other | Attending: Physical Medicine and Rehabilitation | Admitting: Physical Medicine and Rehabilitation

## 2021-04-30 ENCOUNTER — Encounter: Payer: Self-pay | Admitting: Physical Medicine and Rehabilitation

## 2021-04-30 VITALS — BP 126/82 | HR 85 | Ht 66.0 in | Wt 200.0 lb

## 2021-04-30 DIAGNOSIS — I69398 Other sequelae of cerebral infarction: Secondary | ICD-10-CM | POA: Diagnosis not present

## 2021-04-30 DIAGNOSIS — S98119A Complete traumatic amputation of unspecified great toe, initial encounter: Secondary | ICD-10-CM | POA: Diagnosis not present

## 2021-04-30 DIAGNOSIS — K5903 Drug induced constipation: Secondary | ICD-10-CM | POA: Insufficient documentation

## 2021-04-30 DIAGNOSIS — R262 Difficulty in walking, not elsewhere classified: Secondary | ICD-10-CM | POA: Insufficient documentation

## 2021-04-30 DIAGNOSIS — G8191 Hemiplegia, unspecified affecting right dominant side: Secondary | ICD-10-CM | POA: Insufficient documentation

## 2021-04-30 DIAGNOSIS — R2689 Other abnormalities of gait and mobility: Secondary | ICD-10-CM | POA: Diagnosis not present

## 2021-04-30 DIAGNOSIS — I63119 Cerebral infarction due to embolism of unspecified vertebral artery: Secondary | ICD-10-CM | POA: Diagnosis not present

## 2021-04-30 DIAGNOSIS — M792 Neuralgia and neuritis, unspecified: Secondary | ICD-10-CM | POA: Insufficient documentation

## 2021-04-30 MED ORDER — LEVETIRACETAM 250 MG PO TABS: 250.0000 mg | ORAL_TABLET | Freq: Two times a day (BID) | ORAL | 5 refills | Status: DC

## 2021-04-30 MED ORDER — PREGABALIN 150 MG PO CAPS: 150.0000 mg | ORAL_CAPSULE | Freq: Three times a day (TID) | ORAL | 5 refills | Status: DC

## 2021-04-30 MED ORDER — SENNA 8.6 MG PO TABS: 1.0000 | ORAL_TABLET | Freq: Two times a day (BID) | ORAL | 5 refills | Status: DC

## 2021-04-30 NOTE — Progress Notes (Signed)
? ?Subjective:  ? ? Patient ID: Andrew Bruce, male    DOB: 1965-08-05, 56 y.o.   MRN: [id number] ? ?HPI ? 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 Original admission 01/26/19- and came to CIR 03/23/19- discharged- 04/20/19.  ?Has visual difficulties- double vision due to stroke.  ?Here for f/u on stroke as well as nerve pain and DM.   ? ?Ran out of Lyrica.  ?1 week ago.  ? ?Was helping a little bit- was a little better ~ 10% better.  ?Was taking 150 mg TID.  ?Was having some swelling- mainly notable by leaving marks from socks.  ? ? ?Experiencing constipation- want to know what could help.  ? ?Really bothersome.  ? ?Hasn't approved disability- still hasn't gotten disability, and has been 2 years.  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 ? ?Pain Inventory ?Average Pain 8 ?Pain Right Now 8 ?My pain is burning ? ?LOCATION OF PAIN  back, fingers, leg, foot ? ?BOWEL ?Number of stools per week: 2 ?Oral laxative use No  ?Type of laxative na ?Enema or suppository use No  ?History of colostomy No  ?Incontinent No  ? ?BLADDER ?Normal ?In and out cath, frequency . ?Able to self cath  . ?Bladder incontinence No  ?Frequent urination No  ?Leakage with coughing No  ?Difficulty starting stream No  ?Incomplete bladder emptying No  ? ? ?Mobility ?use a cane ?how many minutes can you walk? 45 ?ability to climb steps?  yes ?do you drive?  no ? ?Function ?disabled: date disabled . ?I need assistance with the following:  meal prep, household duties, and shopping ? ?Neuro/Psych ?bowel control problems ?weakness ?numbness ?trouble walking ?depression ? ?Prior  Studies ?Any changes since last visit?  no ? ?Physicians involved in your care ?Any changes since last visit?  no ? ? ?Family History  ?Problem Relation Age of Onset  ? Healthy Sister   ? Healthy Brother   ? ?Social History  ? ?Socioeconomic History  ? Marital status: Married  ?  Spouse name: Not on file  ? Number of children: Not on file  ? Years of education: Not on file  ? Highest education level: Not on file  ?Occupational History  ? Not on file  ?Tobacco Use  ? Smoking status: Former  ? Smokeless tobacco: Never  ?Vaping Use  ? Vaping Use: Never used  ?Substance and Sexual Activity  ? Alcohol use: Yes  ?  Comment: rare to occasional  ? Drug use: Never  ? Sexual activity: Yes  ?  Birth control/protection: None  ?Other Topics Concern  ? Not on file  ?Social History Narrative  ? Not on file  ? ?Social Determinants of Health  ? ?Financial Resource Strain: Not on file  ?Food Insecurity: Not on file  ?Transportation Needs: Not on file  ?Physical Activity: Not on file  ?Stress: Not on file  ?Social Connections: Not on file  ? ?Past Surgical History:  ?Procedure Laterality Date  ? AMPUTATION Right 08/13/2019  ? Procedure: AMPUTATION RIGHT FIRST AND SECOND TOES;  Surgeon: Rosetta Posner,  MD;  Location: [facility];  Service: Vascular;  Laterality: Right;  ? DIRECT LARYNGOSCOPY Bilateral 04/06/2019  ? Procedure: MICRO DIRECT LARYNGOSCOPY WITH PROLARYN INJECTION;  Surgeon: Melida Quitter, MD;  Location: [facility];  Service: ENT;  Laterality: Bilateral;  ? IR GASTROSTOMY TUBE MOD SED  03/10/2019  ? IR GASTROSTOMY TUBE REMOVAL  04/20/2019  ? WOUND DEBRIDEMENT Left 08/13/2019  ? Procedure: AMPUTATION  OF LEFT FIRST TOE, AND DEBRIDEMENT OF LEFT SECOND, THIRD AND FOURTH TOES;  Surgeon: Rosetta Posner, MD;  Location: [facility];  Service: Vascular;  Laterality: Left;  ? ?Past Medical History:  ?Diagnosis Date  ? COVID-19 02/06/2019  ? Diabetes mellitus without complication ([facility])   ? Hypertension   ? PE (pulmonary thromboembolism) ([facility])   ? 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/19  with cor pulmonale in setting of COVID ARDS  ? Pneumonia   ? with Covid  ? Stroke [facility])   ? weakness on right side, occurred while he had Covid  ? ?BP 126/82   Pulse 85   Ht 5\' 6"  (1.676 m)   Wt 200 lb (90.7 kg)   SpO2 94%   BMI 32.28 kg/m?  ? ?Opioid Risk Score:   ?Fall Risk Score:  `1 ? ?Depression screen PHQ 2/9 ? ? ?  03/14/2021  ?  3:21 PM 01/29/2021  ?  3:24 PM 01/04/2021  ?  2:00 PM 12/15/2020  ? 10:54 AM 10/18/2020  ?  2:57 PM 04/21/2020  ?  3:27 PM 03/06/2020  ?  1:23 PM  ?Depression screen PHQ 2/9  ?Decreased Interest 0 1 0 3 0 0 0  ?Down, Depressed, Hopeless 0 1 0 3 0 0 0  ?PHQ - 2 Score 0 2 0 6 0 0 0  ?  ? ?Review of Systems  ?Respiratory:  Positive for shortness of breath.   ?Gastrointestinal:  Positive for constipation.  ?Musculoskeletal:  Positive for back pain.  ?     Foot pain ?Finger numbness ?Leg pain  ?Neurological:  Positive for weakness and numbness.  ?All other systems reviewed and are negative. ? ?   ?Objective:  ? Physical Exam ?Awake, alert, appropriate, sitting on table; accompanied by interpretor; using Single point cane, NAD ?Missing toes/TMA on other foot ?Chronic ?Has gained weight ? ? ? ?   ?Assessment & Plan:  ? 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 Original admission 01/26/19- and came to CIR 03/23/19- discharged- 04/20/19.  ?Has visual difficulties- double vision due to stroke.  ?Here for f/u on stroke as well as nerve pain and DM.   ? ?Cbgs running 120s- doing much better. A1c is 7.0- as of 3/23.  ? ? ?Maintain Lyrica 150 mg TID/3x/day for nerve pain.  ? ?2.  A1c doing better- should help nerve pain in the long  run-  ? ?3. Keppra/Levecetracem- 250 mg 2x/day x 1 week, then 500 mg BID/2x/day- for nerve pain.  ?Add to taking Lyrica/Pregabalin for nerve pain.  ? ?4. Explained narcotics won't help nerve pain- doesn't help burning pain.  ? ? ?5.  For constipation- will try Senna 1-2 tabs 2x/day for  hard stools and difficulty having BM.  ?Will try to send through insurance- if doesn't work, is over the counter.  ? ?6.  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   ? ?7. Continue to walk 30 minutes/day for Blood sugar control.  ? ? ?8.  F/U in 3 months- on nerve pain ? ? ? ?I spent a total of  32  minutes on total care today- >50% coordination of care- due to interpretor and discussing disability nerve pain and constipation.  ? ?

## 2021-04-30 NOTE — Patient Instructions (Signed)
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 Original admission 01/26/19- and came to CIR 03/23/19- discharged- 04/20/19.  ?Has visual difficulties- double vision due to stroke.  ?Here for f/u on stroke as well as nerve pain and DM.   ? ?Cbgs running 120s- doing much better. A1c is 7.0- as of 3/23.  ? ? ?Maintain Lyrica 150 mg TID/3x/day for nerve pain.  ? ?2.  A1c doing better- should help nerve pain in the long run-  ? ?3. Keppra/Levecetracem- 250 mg 2x/day x 1 week, then 500 mg BID/2x/day- for nerve pain.  ?Add to taking Lyrica/Pregabalin for nerve pain.  ? ?4. Explained narcotics won't help nerve pain- doesn't help burning pain.  ? ? ?5.  For constipation- will try Senna 1-2 tabs 2x/day for hard stools and difficulty having BM.  ?Will try to send through insurance- if doesn't work, is over the counter.  ? ?6.  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   ? ?7. Continue to walk 30 minutes/day for Blood sugar control.  ? ? ?8.  F/U in 3 months- on nerve pain- double appt-  ?

## 2021-05-01 ENCOUNTER — Ambulatory Visit (INDEPENDENT_AMBULATORY_CARE_PROVIDER_SITE_OTHER): Payer: Medicaid Other | Admitting: Nurse Practitioner

## 2021-05-01 ENCOUNTER — Encounter: Payer: Self-pay | Admitting: Nurse Practitioner

## 2021-05-01 VITALS — BP 133/87 | HR 78 | Temp 98.0°F | Ht 66.0 in | Wt 200.5 lb

## 2021-05-01 DIAGNOSIS — M79671 Pain in right foot: Secondary | ICD-10-CM

## 2021-05-01 DIAGNOSIS — E11621 Type 2 diabetes mellitus with foot ulcer: Secondary | ICD-10-CM

## 2021-05-01 DIAGNOSIS — L97509 Non-pressure chronic ulcer of other part of unspecified foot with unspecified severity: Secondary | ICD-10-CM | POA: Diagnosis not present

## 2021-05-01 DIAGNOSIS — M79672 Pain in left foot: Secondary | ICD-10-CM

## 2021-05-01 DIAGNOSIS — J3800 Paralysis of vocal cords and larynx, unspecified: Secondary | ICD-10-CM | POA: Diagnosis not present

## 2021-05-01 NOTE — Patient Instructions (Signed)
You were seen today in the [facility] for follow up. Labs were collected, results will be available via MyChart or, if abnormal, you will be contacted by clinic staff.  Please follow up in 3 mths for reevaluation of DM2 ?

## 2021-05-01 NOTE — Progress Notes (Signed)
? ?[place] ?[place], [place]  [address] ?Phone:  [phone]   Fax:  [phone] ?Subjective:  ? Patient ID: Andrew Bruce, male    DOB: 1965-08-05, 56 y.o.   MRN: [id number] ? ?Chief Complaint  ?Patient presents with  ? Follow-up  ?  Pt is here for 6 weeks follow up. Pt stated he still has a pinching  pain in his feet and hands. Pt would like a referral for ENT  ? ?HPI ?Andrew Bruce 56 y.o. male  has a past medical history of COVID-19 (02/06/2019), Diabetes mellitus without complication ([place]), Hypertension, PE (pulmonary thromboembolism) ([place]), Pneumonia, and Stroke ([place]). To the [facility] for 6 wk follow up. ? ?Patient requesting new referral to ENT, has  past history of throat surgery. States that he has been unable to get in contact with primary ENT specialist. ? ?Also concerned about bilateral continuous pinching in bilateral feet. But states that these symptoms are being well managed by pain management. ? ?Denies any other concerns today. States that his blood glucose at home is between 118-120. Currently compliant with all medications.  ? ?Denies any fatigue, chest pain, shortness of breath, HA or dizziness. Denies any blurred vision, numbness or tingling. ? ? ?Past Medical History:  ?Diagnosis Date  ? COVID-19 02/06/2019  ? Diabetes mellitus without complication ([place])   ? Hypertension   ? PE (pulmonary thromboembolism) ([place])   ? 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 Pneumonia   ? with Covid  ? Stroke [facility])   ? weakness on right side, occurred while he had Covid  ? ? ?Past Surgical History:  ?Procedure Laterality Date  ? AMPUTATION Right 08/13/2019  ? Procedure: AMPUTATION RIGHT FIRST AND SECOND TOES;  Surgeon: Rosetta Posner, MD;  Location: [place];  Service: Vascular;  Laterality: Right;  ? DIRECT LARYNGOSCOPY Bilateral 04/06/2019  ? Procedure: MICRO DIRECT LARYNGOSCOPY WITH PROLARYN INJECTION;  Surgeon: Melida Quitter, MD;  Location: [place];   Service: ENT;  Laterality: Bilateral;  ? IR GASTROSTOMY TUBE MOD SED  03/10/2019  ? IR GASTROSTOMY TUBE REMOVAL  04/20/2019  ? WOUND DEBRIDEMENT Left 08/13/2019  ? Procedure: AMPUTATION  OF LEFT FIRST TOE, AND DEBRIDEMENT OF LEFT SECOND, THIRD AND FOURTH TOES;  Surgeon: Rosetta Posner, MD;  Location: [place];  Service: Vascular;  Laterality: Left;  ? ? ?Family History  ?Problem Relation Age of Onset  ? Healthy Sister   ? Healthy Brother   ? ? ?Social History  ? ?Socioeconomic History  ? Marital status: Married  ?  Spouse name: Not on file  ? Number of children: Not on file  ? Years of education: Not on file  ? Highest education level: Not on file  ?Occupational History  ? Not on file  ?Tobacco Use  ? Smoking status: Former  ? Smokeless tobacco: Never  ?Vaping Use  ? Vaping Use: Never used  ?Substance and Sexual Activity  ? Alcohol use: Yes  ?  Comment: rare to occasional  ? Drug use: Never  ? Sexual activity: Yes  ?  Birth control/protection: None  ?Other Topics Concern  ? Not on file  ?Social History Narrative  ? Not on file  ? ?Social Determinants of Health  ? ?Financial Resource Strain: Not on file  ?Food Insecurity: Not on file  ?Transportation Needs: Not on file  ?Physical Activity: Not on file  ?Stress: Not on file  ?Social Connections: Not on  file  ?Intimate Partner Violence: Not on file  ? ? ?Outpatient Medications Prior to Visit  ?Medication Sig Dispense Refill  ? amLODipine (NORVASC) 10 MG tablet Take 1 tablet (10 mg total) by mouth daily. 90 tablet 1  ? blood glucose meter kit and supplies KIT Dispense based on patient and insurance preference. Use up to four times daily as directed. (FOR ICD-9 250.00, 250.01). 1 each 0  ? carboxymethylcellulose 1 % ophthalmic solution Apply 1 drop to eye 3 (three) times daily. 30 mL 0  ? Continuous Blood Gluc Receiver (FREESTYLE LIBRE 2 READER) DEVI 1 Device by Does not apply route every 14 (fourteen) days. 1 each 1  ? Continuous Blood Gluc Sensor (FREESTYLE LIBRE 2 SENSOR)  MISC 1 application by Does not apply route every 14 (fourteen) days. 2 each 11  ? dextromethorphan-guaiFENesin (MUCINEX DM) 30-600 MG 12hr tablet Take 1 tablet by mouth 2 (two) times daily. 30 tablet 0  ? DULoxetine (CYMBALTA) 60 MG capsule Take 1 capsule (60 mg total) by mouth daily. For nerve pain 30 capsule 11  ? Elastic Bandages & Supports (MEDICAL COMPRESSION SOCKS) MISC 1 application by Does not apply route every 12 (twelve) hours. 1 each 0  ? glipiZIDE (GLUCOTROL) 5 MG tablet Take 1 tablet (5 mg total) by mouth 2 (two) times daily before a meal. 180 tablet 3  ? levETIRAcetam (KEPPRA) 250 MG tablet Take 1 tablet (250 mg total) by mouth 2 (two) times daily. X 1 week, then 500 mg BID- for nerve pain 120 tablet 5  ? lisinopril (ZESTRIL) 40 MG tablet Take 1 tablet (40 mg total) by mouth daily. 90 tablet 3  ? metFORMIN (GLUCOPHAGE) 500 MG tablet Take 1 tablet (500 mg total) by mouth 2 (two) times daily with a meal. 180 tablet 3  ? metoprolol tartrate (LOPRESSOR) 25 MG tablet Take 1 tablet (25 mg total) by mouth 2 (two) times daily. For blood pressure 60 tablet 5  ? olopatadine (PATADAY) 0.1 % ophthalmic solution Place 1 drop into both eyes 2 (two) times daily. 5 mL 12  ? pregabalin (LYRICA) 150 MG capsule Take 1 capsule (150 mg total) by mouth 3 (three) times daily. - for nerve pain 90 capsule 5  ? senna (SENOKOT) 8.6 MG TABS tablet Take 1-2 tablets (8.6-17.2 mg total) by mouth 2 (two) times daily. For constipation 120 tablet 5  ? atorvastatin (LIPITOR) 20 MG tablet Take 1 tablet (20 mg total) by mouth daily at 6 PM. (Patient not taking: Reported on 05/01/2021) 90 tablet 3  ? ?No facility-administered medications prior to visit.  ? ? ?No Known Allergies ? ?Review of Systems  ?Constitutional:  Negative for chills, fever and malaise/fatigue.  ?HENT: Negative.    ?     See HPI  ?Respiratory:  Negative for cough and shortness of breath.   ?Cardiovascular:  Negative for chest pain, palpitations and leg swelling.   ?Gastrointestinal:  Negative for abdominal pain, blood in stool, constipation, diarrhea, nausea and vomiting.  ?Musculoskeletal:   ?     See HPI  ?Skin: Negative.   ?Neurological: Negative.   ?Psychiatric/Behavioral:  Negative for depression. The patient is not nervous/anxious.   ?All other systems reviewed and are negative. ? ?   ?Objective:  ?  ?Physical Exam ?Vitals reviewed.  ?Constitutional:   ?   General: He is not in acute distress. ?   Appearance: Normal appearance. He is obese.  ?HENT:  ?   Head: Normocephalic.  ?Eyes:  ?  General: No scleral icterus.    ?   Right eye: No discharge.     ?   Left eye: No discharge.  ?   Extraocular Movements: Extraocular movements intact.  ?   Conjunctiva/sclera: Conjunctivae normal.  ?   Pupils: Pupils are equal, round, and reactive to light.  ?Neck:  ?   Vascular: No carotid bruit.  ?Cardiovascular:  ?   Rate and Rhythm: Normal rate and regular rhythm.  ?   Pulses: Normal pulses.  ?   Heart sounds: Normal heart sounds.  ?   Comments: No obvious peripheral edema ?Pulmonary:  ?   Effort: Pulmonary effort is normal.  ?   Breath sounds: Normal breath sounds.  ?Musculoskeletal:     ?   General: No swelling, tenderness, deformity or signs of injury. Normal range of motion.  ?   Cervical back: Normal range of motion and neck supple. No rigidity or tenderness.  ?   Right lower leg: No edema.  ?   Left lower leg: No edema.  ?Lymphadenopathy:  ?   Cervical: No cervical adenopathy.  ?Skin: ?   General: Skin is warm and dry.  ?   Capillary Refill: Capillary refill takes less than 2 seconds.  ?Neurological:  ?   General: No focal deficit present.  ?   Mental Status: He is alert and oriented to person, place, and time.  ?Psychiatric:     ?   Mood and Affect: Mood normal.     ?   Behavior: Behavior normal.     ?   Thought Content: Thought content normal.     ?   Judgment: Judgment normal.  ? ? ?BP 133/87 (BP Location: Right Arm, Patient Position: Sitting, Cuff Size: Normal)   Pulse 78    Temp 98 ?F (36.7 ?C)   Ht 5' 6"  (1.676 m)   Wt 200 lb 8 oz (90.9 kg)   SpO2 100%   BMI 32.36 kg/m?  ?Wt Readings from Last 3 Encounters:  ?05/01/21 200 lb 8 oz (90.9 kg)  ?04/30/21 200 lb (90.7 kg)  ?03/14/21 207 lb

## 2021-06-25 ENCOUNTER — Ambulatory Visit: Payer: Medicaid Other

## 2021-06-25 ENCOUNTER — Ambulatory Visit: Payer: Medicaid Other | Admitting: Podiatry

## 2021-06-25 ENCOUNTER — Ambulatory Visit (INDEPENDENT_AMBULATORY_CARE_PROVIDER_SITE_OTHER): Payer: Medicaid Other

## 2021-06-25 DIAGNOSIS — B351 Tinea unguium: Secondary | ICD-10-CM

## 2021-06-25 DIAGNOSIS — E1351 Other specified diabetes mellitus with diabetic peripheral angiopathy without gangrene: Secondary | ICD-10-CM | POA: Diagnosis not present

## 2021-06-25 DIAGNOSIS — Z89412 Acquired absence of left great toe: Secondary | ICD-10-CM

## 2021-06-25 DIAGNOSIS — M79674 Pain in right toe(s): Secondary | ICD-10-CM

## 2021-06-25 DIAGNOSIS — M79675 Pain in left toe(s): Secondary | ICD-10-CM | POA: Diagnosis not present

## 2021-06-25 DIAGNOSIS — Z89411 Acquired absence of right great toe: Secondary | ICD-10-CM | POA: Diagnosis not present

## 2021-06-25 NOTE — Progress Notes (Signed)
   HPI: 56 y.o. male PMHx DM type II presenting for follow-up evaluation of history of bilateral toe amputations.  Patient states that he has tightness to his feet bilaterally.  He is wondering about possible toe prosthetics.  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  Dermatology: Skin is warm, dry and supple bilateral lower extremities. Negative for open lesions or macerations.  Vascular: Diminished but palpable pedal pulses bilaterally. No edema or erythema noted.   Neurological: Epicritic and protective threshold grossly intact bilaterally.   Musculoskeletal Exam: Amputation of the bilateral great toes as well as the right second toe  Assessment: 1.  Diabetes mellitus type 2 2.  History of bilateral toe amputations 3.  Nocturnal calf pain bilateral lower extremities   Plan of Care:  1. Patient evaluated. 2.  Comprehensive diabetic foot exam was performed today. 3.  Continue diabetic shoes with diabetic insoles and a toe filler.  Appointment with Pedorthist for new custom molded diabetic insoles 4.  Continue management of the Lyrica and tramadol with her PCP 5.  Order placed today for B/L ABIs.  Recommend vascular consult if abnormal 6.  Return to clinic annually   Felecia Shelling, DPM [facility]  Dr. Felecia Shelling, DPM    [address] [place], [place] [address]                Office [phone]  Fax [phone]

## 2021-06-26 ENCOUNTER — Ambulatory Visit (INDEPENDENT_AMBULATORY_CARE_PROVIDER_SITE_OTHER): Payer: Medicaid Other | Admitting: Nurse Practitioner

## 2021-06-26 ENCOUNTER — Encounter: Payer: Self-pay | Admitting: Nurse Practitioner

## 2021-06-26 VITALS — BP 157/87 | HR 61 | Temp 97.3°F | Ht 66.0 in | Wt 203.6 lb

## 2021-06-26 DIAGNOSIS — E11621 Type 2 diabetes mellitus with foot ulcer: Secondary | ICD-10-CM

## 2021-06-26 DIAGNOSIS — I10 Essential (primary) hypertension: Secondary | ICD-10-CM | POA: Diagnosis not present

## 2021-06-26 DIAGNOSIS — E7849 Other hyperlipidemia: Secondary | ICD-10-CM

## 2021-06-26 DIAGNOSIS — I63119 Cerebral infarction due to embolism of unspecified vertebral artery: Secondary | ICD-10-CM | POA: Diagnosis not present

## 2021-06-26 DIAGNOSIS — F3289 Other specified depressive episodes: Secondary | ICD-10-CM

## 2021-06-26 DIAGNOSIS — H04129 Dry eye syndrome of unspecified lacrimal gland: Secondary | ICD-10-CM

## 2021-06-26 DIAGNOSIS — J3800 Paralysis of vocal cords and larynx, unspecified: Secondary | ICD-10-CM

## 2021-06-26 DIAGNOSIS — L97509 Non-pressure chronic ulcer of other part of unspecified foot with unspecified severity: Secondary | ICD-10-CM | POA: Diagnosis not present

## 2021-06-26 DIAGNOSIS — Z789 Other specified health status: Secondary | ICD-10-CM

## 2021-06-26 LAB — POCT GLYCOSYLATED HEMOGLOBIN (HGB A1C)
HbA1c POC (<> result, manual entry): 6.5 % (ref 4.0–5.6)
HbA1c, POC (controlled diabetic range): 6.5 % (ref 0.0–7.0)
HbA1c, POC (prediabetic range): 6.5 % — AB (ref 5.7–6.4)
Hemoglobin A1C: 6.5 % — AB (ref 4.0–5.6)

## 2021-06-26 MED ORDER — AMLODIPINE BESYLATE 10 MG PO TABS: 10.0000 mg | ORAL_TABLET | Freq: Every day | ORAL | 1 refills | Status: DC

## 2021-06-26 MED ORDER — METFORMIN HCL 500 MG PO TABS: 500.0000 mg | ORAL_TABLET | Freq: Two times a day (BID) | ORAL | 3 refills | Status: DC

## 2021-06-26 MED ORDER — GLIPIZIDE 5 MG PO TABS: 5.0000 mg | ORAL_TABLET | Freq: Two times a day (BID) | ORAL | 3 refills | Status: DC

## 2021-06-26 MED ORDER — OLOPATADINE HCL 0.1 % OP SOLN: 1.0000 [drp] | Freq: Two times a day (BID) | OPHTHALMIC | 0 refills | Status: DC

## 2021-06-26 MED ORDER — METOPROLOL TARTRATE 25 MG PO TABS: 25.0000 mg | ORAL_TABLET | Freq: Two times a day (BID) | ORAL | 5 refills | Status: DC

## 2021-06-26 MED ORDER — ATORVASTATIN CALCIUM 20 MG PO TABS: 20.0000 mg | ORAL_TABLET | Freq: Every day | ORAL | 3 refills | Status: DC

## 2021-06-26 MED ORDER — LISINOPRIL 40 MG PO TABS: 40.0000 mg | ORAL_TABLET | Freq: Every day | ORAL | 3 refills | Status: DC

## 2021-06-26 MED ORDER — DULOXETINE HCL 60 MG PO CPEP: 60.0000 mg | ORAL_CAPSULE | Freq: Every day | ORAL | 11 refills | Status: DC

## 2021-06-26 NOTE — Patient Instructions (Signed)
You were seen today in the [facility] for multiple complaints. Labs were collected, results will be available via MyChart or, if abnormal, you will be contacted by clinic staff. You were prescribed medications, please take as directed. Please follow up in 3 mths for reevaluation of chronic illness

## 2021-06-26 NOTE — Progress Notes (Signed)
[place] [place], [place]  [address] Phone:  [phone]   Fax:  [phone] Subjective:   Patient ID: Andrew Bruce, male    DOB: May 12, 1965, 56 y.o.   MRN: [id number]  Chief Complaint  Patient presents with   Follow-up    Pt is here for 3 month follow up visit. Pt stated he has dry eyes, numbness on his right arm and hip, swelling in both feet and throbbing pain.   HPI Candice Lunney 56 y.o. male  has a past medical history of COVID-19 (02/06/2019), Diabetes mellitus without complication ([place]), Hypertension, PE (pulmonary thromboembolism) ([place]), Pneumonia, and Stroke ([place]). To the [facility] for 3 mth follow up and other complaints.   States that he has been having numbness in right arm, face and RLE near hip. Also endorses reduces strength/ weakness in RLE. Symptoms began worsening 1 wk ago. Has had symptoms since stroke in 2021. Has been using a cane for the past 2 yrs since being discharged from the hospital. When questioned about reasoning for not going to the ED, states that he made an appointment in clinic instead. Endorses some blurred vision, but denies any HA or dizziness. Denies any recent trauma or injury. Uncertain of last visit with neurologist.   States that for the past week he has had worsening dry eyes, states that they feel heavy. Has been using eye drops with no relief in symptoms. Has appointment in August for eye doctor.   When questioned about B/P today, states that he is compliant with all medications. Suspects that B/P is elevated due to recent increase in stressors.   Requesting referral for ENT doctor due to raspy voice. States that he was intubated at time of stroke and suspects there may have been some damage to vocal cords. Denies any other concerns today.   Denies any fatigue, chest pain, shortness of breath, HA or dizziness. Denies any blurred vision, numbness or tingling.   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   Pneumonia    with Covid   Stroke ([place])    weakness on right side, occurred while he had Covid    Past Surgical History:  Procedure Laterality Date   AMPUTATION Right 08/13/2019   Procedure: AMPUTATION RIGHT FIRST AND SECOND TOES;  Surgeon: Rosetta Posner, MD;  Location: [facility] OR;  Service: Vascular;  Laterality: Right;   DIRECT LARYNGOSCOPY Bilateral 04/06/2019   Procedure: MICRO DIRECT LARYNGOSCOPY WITH PROLARYN INJECTION;  Surgeon: Melida Quitter, MD;  Location: [facility];  Service: ENT;  Laterality: Bilateral;   IR GASTROSTOMY TUBE MOD SED  03/10/2019   IR GASTROSTOMY TUBE REMOVAL  04/20/2019   WOUND DEBRIDEMENT Left 08/13/2019   Procedure: AMPUTATION  OF LEFT FIRST TOE, AND DEBRIDEMENT OF LEFT SECOND, THIRD AND FOURTH TOES;  Surgeon: Rosetta Posner, MD;  Location: [facility] OR;  Service: Vascular;  Laterality: Left;    Family History  Problem Relation Age of Onset   Healthy Sister    Healthy Brother     Social History   Socioeconomic History   Marital status: Married    Spouse name: Not on file   Number of children: Not on file   Years of education: Not on file   Highest education level: Not on  file  Occupational History   Not on file  Tobacco Use   Smoking status: Former   Smokeless tobacco: Never  Vaping Use   Vaping Use: Never used  Substance and Sexual Activity   Alcohol use: Yes    Comment: rare to occasional   Drug use: Never   Sexual activity: Yes    Birth control/protection: None  Other Topics Concern   Not on file  Social History Narrative   Not on file   Social Determinants of Health   Financial Resource Strain: Not on file  Food Insecurity: Not on file  Transportation Needs: Not on file  Physical Activity: Not on file  Stress: Not on file  Social Connections: Not on file  Intimate  Partner Violence: Not on file    Outpatient Medications Prior to Visit  Medication Sig Dispense Refill   blood glucose meter kit and supplies KIT Dispense based on patient and insurance preference. Use up to four times daily as directed. (FOR ICD-9 250.00, 250.01). 1 each 0   levETIRAcetam (KEPPRA) 250 MG tablet Take 1 tablet (250 mg total) by mouth 2 (two) times daily. X 1 week, then 500 mg BID- for nerve pain 120 tablet 5   pregabalin (LYRICA) 150 MG capsule Take 1 capsule (150 mg total) by mouth 3 (three) times daily. - for nerve pain 90 capsule 5   glipiZIDE (GLUCOTROL) 5 MG tablet Take 1 tablet (5 mg total) by mouth 2 (two) times daily before a meal. 180 tablet 3   lisinopril (ZESTRIL) 40 MG tablet Take 1 tablet (40 mg total) by mouth daily. 90 tablet 3   metFORMIN (GLUCOPHAGE) 500 MG tablet Take 1 tablet (500 mg total) by mouth 2 (two) times daily with a meal. 180 tablet 3   carboxymethylcellulose 1 % ophthalmic solution Apply 1 drop to eye 3 (three) times daily. (Patient not taking: Reported on 06/26/2021) 30 mL 0   Continuous Blood Gluc Receiver (FREESTYLE LIBRE 2 READER) DEVI 1 Device by Does not apply route every 14 (fourteen) days. 1 each 1   Continuous Blood Gluc Sensor (FREESTYLE LIBRE 2 SENSOR) MISC 1 application by Does not apply route every 14 (fourteen) days. 2 each 11   dextromethorphan-guaiFENesin (MUCINEX DM) 30-600 MG 12hr tablet Take 1 tablet by mouth 2 (two) times daily. (Patient not taking: Reported on 06/26/2021) 30 tablet 0   Elastic Bandages & Supports (MEDICAL COMPRESSION SOCKS) MISC 1 application by Does not apply route every 12 (twelve) hours. 1 each 0   senna (SENOKOT) 8.6 MG TABS tablet Take 1-2 tablets (8.6-17.2 mg total) by mouth 2 (two) times daily. For constipation (Patient not taking: Reported on 06/26/2021) 120 tablet 5   amLODipine (NORVASC) 10 MG tablet Take 1 tablet (10 mg total) by mouth daily. (Patient not taking: Reported on 06/26/2021) 90 tablet 1    atorvastatin (LIPITOR) 20 MG tablet Take 1 tablet (20 mg total) by mouth daily at 6 PM. (Patient not taking: Reported on 05/01/2021) 90 tablet 3   DULoxetine (CYMBALTA) 60 MG capsule Take 1 capsule (60 mg total) by mouth daily. For nerve pain (Patient not taking: Reported on 06/26/2021) 30 capsule 11   metoprolol tartrate (LOPRESSOR) 25 MG tablet Take 1 tablet (25 mg total) by mouth 2 (two) times daily. For blood pressure (Patient not taking: Reported on 06/26/2021) 60 tablet 5   olopatadine (PATADAY) 0.1 % ophthalmic solution Place 1 drop into both eyes 2 (two) times daily. (Patient not taking: Reported on 06/26/2021) 5  mL 12   No facility-administered medications prior to visit.    No Known Allergies  Review of Systems  Constitutional:  Negative for chills, fever and malaise/fatigue.  Eyes:  Negative for blurred vision, double vision, photophobia, pain, discharge and redness.  Respiratory:  Negative for cough and shortness of breath.   Cardiovascular:  Negative for chest pain, palpitations and leg swelling.  Gastrointestinal:  Negative for abdominal pain, blood in stool, constipation, diarrhea, nausea and vomiting.  Musculoskeletal: Negative.   Skin: Negative.   Neurological:  Positive for sensory change, focal weakness and weakness.  Psychiatric/Behavioral:  Negative for depression. The patient is not nervous/anxious.   All other systems reviewed and are negative.      Objective:    Physical Exam Vitals reviewed.  Constitutional:      General: He is not in acute distress.    Appearance: Normal appearance. He is obese.     Comments: Cane used as assistive device during visit  HENT:     Head: Normocephalic.  Neck:     Vascular: No carotid bruit.  Cardiovascular:     Rate and Rhythm: Normal rate and regular rhythm.     Pulses: Normal pulses.     Heart sounds: Normal heart sounds.     Comments: No obvious peripheral edema Pulmonary:     Effort: Pulmonary effort is normal.      Breath sounds: Normal breath sounds.  Musculoskeletal:        General: No swelling, tenderness, deformity or signs of injury. Normal range of motion.     Cervical back: Normal range of motion and neck supple. No rigidity or tenderness.     Right lower leg: No edema.     Left lower leg: No edema.  Lymphadenopathy:     Cervical: No cervical adenopathy.  Skin:    General: Skin is warm and dry.     Capillary Refill: Capillary refill takes less than 2 seconds.  Neurological:     General: No focal deficit present.     Mental Status: He is alert and oriented to person, place, and time.  Psychiatric:        Mood and Affect: Mood normal.        Behavior: Behavior normal.        Thought Content: Thought content normal.        Judgment: Judgment normal.     BP (!) 157/87 (BP Location: Left Arm, Patient Position: Sitting, Cuff Size: Normal)   Pulse 61   Temp (!) 97.3 F (36.3 C)   Ht 5' 6"  (1.676 m)   Wt 203 lb 9.6 oz (92.4 kg)   SpO2 100%   BMI 32.86 kg/m  Wt Readings from Last 3 Encounters:  06/26/21 203 lb 9.6 oz (92.4 kg)  05/01/21 200 lb 8 oz (90.9 kg)  04/30/21 200 lb (90.7 kg)    Immunization History  Administered Date(s) Administered   Influenza,inj,Quad PF,6+ Mos 03/25/2019, 11/17/2020   Pneumococcal Polysaccharide-23 03/25/2019, 11/17/2020    Diabetic Foot Exam - Simple   No data filed     Lab Results  Component Value Date   TSH 4.156 03/21/2019   Lab Results  Component Value Date   WBC 7.8 03/14/2021   HGB 14.1 03/14/2021   HCT 40.7 03/14/2021   MCV 84 03/14/2021   PLT 326 03/14/2021   Lab Results  Component Value Date   NA 140 03/14/2021   K 4.6 03/14/2021   CO2 22 08/13/2019   GLUCOSE  114 (H) 03/14/2021   BUN 18 03/14/2021   CREATININE 0.76 03/14/2021   BILITOT 0.2 03/14/2021   ALKPHOS 64 03/14/2021   AST 28 03/14/2021   ALT 15 08/13/2019   PROT 7.1 03/14/2021   ALBUMIN 4.6 03/14/2021   CALCIUM 9.5 03/14/2021   ANIONGAP 12 08/13/2019   EGFR  107 03/14/2021   Lab Results  Component Value Date   CHOL 212 (H) 06/26/2021   CHOL 232 (H) 03/14/2021   CHOL 134 02/17/2019   Lab Results  Component Value Date   HDL 49 06/26/2021   HDL 49 03/14/2021   HDL 33 (L) 02/17/2019   Lab Results  Component Value Date   LDLCALC 131 (H) 06/26/2021   LDLCALC 141 (H) 03/14/2021   LDLCALC 75 02/17/2019   Lab Results  Component Value Date   TRIG 182 (H) 06/26/2021   TRIG 236 (H) 03/14/2021   TRIG 551 (H) 03/01/2019   Lab Results  Component Value Date   CHOLHDL 4.3 06/26/2021   CHOLHDL 4.7 03/14/2021   CHOLHDL 4.1 02/17/2019   Lab Results  Component Value Date   HGBA1C 6.5 (A) 06/26/2021   HGBA1C 6.5 06/26/2021   HGBA1C 6.5 (A) 06/26/2021   HGBA1C 6.5 06/26/2021       Assessment & Plan:   Problem List Items Addressed This Visit       Cardiovascular and Mediastinum   Embolic stroke (HCC)   Relevant Medications   metoprolol tartrate (LOPRESSOR) 25 MG tablet   atorvastatin (LIPITOR) 20 MG tablet   amLODipine (NORVASC) 10 MG tablet   lisinopril (ZESTRIL) 40 MG tablet   Other Relevant Orders   Ambulatory referral to Neurology     Other   Vocal cord paresis   Relevant Orders   Ambulatory referral to ENT   Other Visit Diagnoses     Type 2 diabetes, controlled, with ulcer of toe (HCC)    -  Primary   Relevant Medications   metoprolol tartrate (LOPRESSOR) 25 MG tablet   atorvastatin (LIPITOR) 20 MG tablet   lisinopril (ZESTRIL) 40 MG tablet   metFORMIN (GLUCOPHAGE) 500 MG tablet   glipiZIDE (GLUCOTROL) 5 MG tablet   Other Relevant Orders   POCT glycosylated hemoglobin (Hb A1C) (Completed) Encouraged continued diet and exercise efforts  Encouraged continued compliance with medication     Dry eye       Relevant Medications   olopatadine (PATADAY) 0.1 % ophthalmic solution   Hypertension, unspecified type       Relevant Medications   metoprolol tartrate (LOPRESSOR) 25 MG tablet   atorvastatin (LIPITOR) 20 MG  tablet   amLODipine (NORVASC) 10 MG tablet   lisinopril (ZESTRIL) 40 MG tablet Encouraged continued diet and exercise efforts  Encouraged continued compliance with medication     Other hyperlipidemia       Relevant Medications   metoprolol tartrate (LOPRESSOR) 25 MG tablet   atorvastatin (LIPITOR) 20 MG tablet   amLODipine (NORVASC) 10 MG tablet   lisinopril (ZESTRIL) 40 MG tablet   Other Relevant Orders   Lipid panel (Completed)   Other depression       Relevant Medications   DULoxetine (CYMBALTA) 60 MG capsule   Language barrier       Follow up in 3 mths for reevaluation of chronic illness, sooner as needed     I have discontinued Jasiah Micale's olopatadine. I am also having him start on olopatadine. Additionally, I am having him maintain his blood glucose meter kit and supplies, carboxymethylcellulose,  dextromethorphan-guaiFENesin, FreeStyle Libre 2 Sensor, [employer] 2 Reader, Medical Compression Socks, pregabalin, levETIRAcetam, senna, metoprolol tartrate, DULoxetine, atorvastatin, amLODipine, lisinopril, metFORMIN, and glipiZIDE.  Meds ordered this encounter  Medications   olopatadine (PATADAY) 0.1 % ophthalmic solution    Sig: Place 1 drop into both eyes 2 (two) times daily.    Dispense:  5 mL    Refill:  0   metoprolol tartrate (LOPRESSOR) 25 MG tablet    Sig: Take 1 tablet (25 mg total) by mouth 2 (two) times daily. For blood pressure    Dispense:  60 tablet    Refill:  5   DULoxetine (CYMBALTA) 60 MG capsule    Sig: Take 1 capsule (60 mg total) by mouth daily. For nerve pain    Dispense:  30 capsule    Refill:  11   atorvastatin (LIPITOR) 20 MG tablet    Sig: Take 1 tablet (20 mg total) by mouth daily at 6 PM.    Dispense:  90 tablet    Refill:  3   amLODipine (NORVASC) 10 MG tablet    Sig: Take 1 tablet (10 mg total) by mouth daily.    Dispense:  90 tablet    Refill:  1   lisinopril (ZESTRIL) 40 MG tablet    Sig: Take 1 tablet (40 mg total) by mouth  daily.    Dispense:  90 tablet    Refill:  3   metFORMIN (GLUCOPHAGE) 500 MG tablet    Sig: Take 1 tablet (500 mg total) by mouth 2 (two) times daily with a meal.    Dispense:  180 tablet    Refill:  3   glipiZIDE (GLUCOTROL) 5 MG tablet    Sig: Take 1 tablet (5 mg total) by mouth 2 (two) times daily before a meal.    Dispense:  180 tablet    Refill:  3     Teena Dunk, NP

## 2021-06-27 LAB — LIPID PANEL
Chol/HDL Ratio: 4.3 ratio (ref 0.0–5.0)
Cholesterol, Total: 212 mg/dL — ABNORMAL HIGH (ref 100–199)
HDL: 49 mg/dL (ref >39)
LDL Chol Calc (NIH): 131 mg/dL — ABNORMAL HIGH (ref 0–99)
Triglycerides: 182 mg/dL — ABNORMAL HIGH (ref 0–149)
VLDL Cholesterol Cal: 32 mg/dL (ref 5–40)

## 2021-06-28 ENCOUNTER — Telehealth: Payer: Self-pay | Admitting: *Deleted

## 2021-06-28 NOTE — Telephone Encounter (Signed)
Marena Chancy calling from [employer] , cpt code [id number], no prior authorization is required for study-#[id number],06/28/21

## 2021-06-28 NOTE — Telephone Encounter (Signed)
Insurance authorization # [id number],GAYGEFU:  Mariane Baumgarten # 90 days. Have notified [facility] to schedule patient.06/27/21.

## 2021-06-29 ENCOUNTER — Other Ambulatory Visit: Payer: Self-pay | Admitting: Nurse Practitioner

## 2021-06-29 DIAGNOSIS — E7849 Other hyperlipidemia: Secondary | ICD-10-CM

## 2021-06-29 MED ORDER — ATORVASTATIN CALCIUM 20 MG PO TABS: 40.0000 mg | ORAL_TABLET | Freq: Every day | ORAL | 0 refills | Status: DC

## 2021-07-02 ENCOUNTER — Encounter (HOSPITAL_COMMUNITY): Payer: Medicaid Other

## 2021-07-03 ENCOUNTER — Other Ambulatory Visit: Payer: Medicaid Other

## 2021-07-18 ENCOUNTER — Other Ambulatory Visit: Payer: Self-pay | Admitting: Nurse Practitioner

## 2021-07-18 DIAGNOSIS — E11621 Type 2 diabetes mellitus with foot ulcer: Secondary | ICD-10-CM

## 2021-07-20 DIAGNOSIS — G6281 Critical illness polyneuropathy: Secondary | ICD-10-CM | POA: Diagnosis not present

## 2021-07-20 DIAGNOSIS — F32A Depression, unspecified: Secondary | ICD-10-CM | POA: Insufficient documentation

## 2021-07-20 DIAGNOSIS — I2699 Other pulmonary embolism without acute cor pulmonale: Secondary | ICD-10-CM | POA: Diagnosis not present

## 2021-07-20 DIAGNOSIS — U099 Post covid-19 condition, unspecified: Secondary | ICD-10-CM | POA: Insufficient documentation

## 2021-07-20 DIAGNOSIS — F419 Anxiety disorder, unspecified: Secondary | ICD-10-CM | POA: Diagnosis not present

## 2021-07-20 DIAGNOSIS — F5101 Primary insomnia: Secondary | ICD-10-CM | POA: Diagnosis not present

## 2021-07-20 DIAGNOSIS — I739 Peripheral vascular disease, unspecified: Secondary | ICD-10-CM | POA: Diagnosis not present

## 2021-07-20 DIAGNOSIS — M2550 Pain in unspecified joint: Secondary | ICD-10-CM | POA: Diagnosis not present

## 2021-07-20 DIAGNOSIS — G8929 Other chronic pain: Secondary | ICD-10-CM | POA: Diagnosis not present

## 2021-07-20 DIAGNOSIS — Z8616 Personal history of COVID-19: Secondary | ICD-10-CM | POA: Insufficient documentation

## 2021-07-21 ENCOUNTER — Ambulatory Visit (INDEPENDENT_AMBULATORY_CARE_PROVIDER_SITE_OTHER): Payer: Medicaid Other | Admitting: Podiatry

## 2021-07-21 DIAGNOSIS — E0843 Diabetes mellitus due to underlying condition with diabetic autonomic (poly)neuropathy: Secondary | ICD-10-CM | POA: Diagnosis not present

## 2021-07-21 DIAGNOSIS — M79671 Pain in right foot: Secondary | ICD-10-CM

## 2021-07-21 NOTE — Progress Notes (Signed)
   HPI: 56 y.o. male PMHx DM type II presenting for follow-up evaluation of history of bilateral toe amputations and chronic pain especially to the right lower extremity.  Patient continues to have cramping into the thigh and leg of the right lower extremity.  Presenting for further treatment and evaluation  Past Medical History:  Diagnosis Date   COVID-19 02/06/2019   Diabetes mellitus without complication (HCC)    Hypertension    PE (pulmonary thromboembolism) (HCC)    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   Pneumonia    with Covid   Stroke (HCC)    weakness on right side, occurred while he had Covid     Physical Exam: General: The patient is alert and oriented x3 in no acute distress.  Dermatology: Skin is warm, dry and supple bilateral lower extremities. Negative for open lesions or macerations.  Vascular: Diminished but palpable pedal pulses bilaterally. No edema or erythema noted.   VAS [place] ABI W/WO TBI 07/06/2020 Summary:  Right: Resting right ankle-brachial index is within normal range. No  evidence of significant right lower extremity arterial disease.   Left: Resting left ankle-brachial index is within normal range. No  evidence of significant left lower extremity arterial disease.   Neurological: Epicritic and protective threshold grossly intact bilaterally.   Musculoskeletal Exam: Amputation of the bilateral great toes as well as the right second toe  Assessment: 1.  Diabetes mellitus type 2 2.  History of bilateral toe amputations 3.  Nocturnal calf pain bilateral lower extremities 4.  PMHx stroke RLE 5.  Sciatica RLE   Plan of Care:  1. Patient evaluated.  ABIs reviewed 2.  Continue management of the Lyrica and tramadol with her PCP 3.  Continue diabetic shoes and insoles 4.  Recommend follow-up with his PCP for possible spinal specialist consult 5.  Recommend OTC Biofreeze topical  6.  Return to clinic as needed   Felecia Shelling,  DPM [facility]  Dr. Felecia Shelling, DPM    [address] [place], [place] [address]                Office [phone]  Fax [phone]

## 2021-07-21 NOTE — Patient Instructions (Signed)
Biofreeze topical.

## 2021-07-25 ENCOUNTER — Ambulatory Visit: Payer: Medicaid Other | Admitting: Neurology

## 2021-07-25 ENCOUNTER — Encounter: Payer: Self-pay | Admitting: Neurology

## 2021-07-25 VITALS — BP 131/78 | HR 72 | Ht 66.0 in | Wt 199.5 lb

## 2021-07-25 DIAGNOSIS — I69359 Hemiplegia and hemiparesis following cerebral infarction affecting unspecified side: Secondary | ICD-10-CM

## 2021-07-25 DIAGNOSIS — I63433 Cerebral infarction due to embolism of bilateral posterior cerebral arteries: Secondary | ICD-10-CM | POA: Diagnosis not present

## 2021-07-25 DIAGNOSIS — U071 COVID-19: Secondary | ICD-10-CM | POA: Diagnosis not present

## 2021-07-25 MED ORDER — ASPIRIN 81 MG PO TBEC: 81.0000 mg | DELAYED_RELEASE_TABLET | Freq: Every day | ORAL | 0 refills | Status: AC

## 2021-07-25 NOTE — Patient Instructions (Signed)
Continue current medications  Add aspirin 81 mg daily  Continue with aggressive physical therapy  Follow up in a year

## 2021-07-25 NOTE — Progress Notes (Signed)
[facility]  PATIENT: Andrew Bruce DOB: March 10, 1965  REQUESTING CLINICIAN: Teena Dunk, NP HISTORY FROM: Patient via interpretor [place] VISIT: Stroke follow up    HISTORICAL  CHIEF COMPLAINT:  Chief Complaint  Patient presents with   New Patient (Initial Visit)    Rm 15. Accompanied by interpreter. NP/internal referral for hx stroke, RLE weakness/numbness, blurry vision.    HISTORY OF PRESENT ILLNESS:  This is a 56 year old gentleman with past medical history of multiple strokes in the setting of COVID infection, hypertension, hyperlipidemia and diabetes mellitus type 2 who is presenting with complaint of right-sided weakness and numbness, bilateral legs pain and throat pain.  Patient reports prior to his COVID infection in 2021 he was working at [employer], independent and has no deficit.  With the COVID infection, he was in the coma for 38-month, he was intubated and had a tracheostomy.  During this time, he did have MRI brain which showed multiple cardioembolic strokes involving both cerebral and cerebellar hemispheres.  Since discharge from the hospital he has completed rehab but still have lingering right-sided deficit.  He reported deficit is mainly weakness on the right and right side pain.  He is compliant with his medication.  He is pending referral to ENT regarding his throat pain.     OTHER MEDICAL CONDITIONS: Multiple strokes, Hypertension, DMII, Hyperlipidemia    REVIEW OF SYSTEMS: Full 14 system review of systems performed and negative with exception of: as noted in the HPI   ALLERGIES: No Known Allergies  HOME MEDICATIONS: Outpatient Medications Prior to Visit  Medication Sig Dispense Refill   amLODipine (NORVASC) 10 MG tablet Take 1 tablet (10 mg total) by mouth daily. 90 tablet 1   atorvastatin (LIPITOR) 20 MG tablet Take 2 tablets (40 mg total) by mouth daily at 6 PM. 180 tablet 0   dextromethorphan-guaiFENesin (MUCINEX  DM) 30-600 MG 12hr tablet Take 1 tablet by mouth 2 (two) times daily. 30 tablet 0   DULoxetine (CYMBALTA) 60 MG capsule Take 1 capsule (60 mg total) by mouth daily. For nerve pain 30 capsule 11   Elastic Bandages & Supports (MEDICAL COMPRESSION SOCKS) MISC 1 application by Does not apply route every 12 (twelve) hours. 1 each 0   glipiZIDE (GLUCOTROL) 5 MG tablet TAKE 1 TABLET BY MOUTH ONCE DAILY BEFORE BREAKFAST 90 tablet 0   levETIRAcetam (KEPPRA) 250 MG tablet Take 1 tablet (250 mg total) by mouth 2 (two) times daily. X 1 week, then 500 mg BID- for nerve pain 120 tablet 5   lisinopril (ZESTRIL) 40 MG tablet Take 1 tablet (40 mg total) by mouth daily. 90 tablet 3   metFORMIN (GLUCOPHAGE) 500 MG tablet Take 1 tablet (500 mg total) by mouth 2 (two) times daily with a meal. 180 tablet 3   metoprolol tartrate (LOPRESSOR) 25 MG tablet Take 1 tablet (25 mg total) by mouth 2 (two) times daily. For blood pressure 60 tablet 5   pregabalin (LYRICA) 150 MG capsule Take 1 capsule (150 mg total) by mouth 3 (three) times daily. - for nerve pain 90 capsule 5   blood glucose meter kit and supplies KIT Dispense based on patient and insurance preference. Use up to four times daily as directed. (FOR ICD-9 250.00, 250.01). 1 each 0   carboxymethylcellulose 1 % ophthalmic solution Apply 1 drop to eye 3 (three) times daily. (Patient not taking: Reported on 06/26/2021) 30 mL 0   Continuous Blood Gluc Receiver (FREESTYLE LIBRE 2 READER)  DEVI 1 Device by Does not apply route every 14 (fourteen) days. 1 each 1   Continuous Blood Gluc Sensor (FREESTYLE LIBRE 2 SENSOR) MISC 1 application by Does not apply route every 14 (fourteen) days. 2 each 11   olopatadine (PATADAY) 0.1 % ophthalmic solution Place 1 drop into both eyes 2 (two) times daily. 5 mL 0   senna (SENOKOT) 8.6 MG TABS tablet Take 1-2 tablets (8.6-17.2 mg total) by mouth 2 (two) times daily. For constipation (Patient not taking: Reported on 06/26/2021) 120 tablet 5    No facility-administered medications prior to visit.    PAST MEDICAL HISTORY: Past Medical History:  Diagnosis Date   COVID-19 02/06/2019   Diabetes mellitus without complication (HCC)    Hypertension    PE (pulmonary thromboembolism) ([place])    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   Pneumonia    with Covid   Stroke (HCC)    weakness on right side, occurred while he had Covid    PAST SURGICAL HISTORY: Past Surgical History:  Procedure Laterality Date   AMPUTATION Right 08/13/2019   Procedure: AMPUTATION RIGHT FIRST AND SECOND TOES;  Surgeon: Rosetta Posner, MD;  Location: [facility] OR;  Service: Vascular;  Laterality: Right;   DIRECT LARYNGOSCOPY Bilateral 04/06/2019   Procedure: MICRO DIRECT LARYNGOSCOPY WITH PROLARYN INJECTION;  Surgeon: Melida Quitter, MD;  Location[facility];  Service: ENT;  Laterality: Bilateral;   IR GASTROSTOMY TUBE MOD SED  03/10/2019   IR GASTROSTOMY TUBE REMOVAL  04/20/2019   WOUND DEBRIDEMENT Left 08/13/2019   Procedure: AMPUTATION  OF LEFT FIRST TOE, AND DEBRIDEMENT OF LEFT SECOND, THIRD AND FOURTH TOES;  Surgeon: Rosetta Posner, MD;  Location: [facility] OR;  Service: Vascular;  Laterality: Left;    FAMILY HISTORY: Family History  Problem Relation Age of Onset   Healthy Sister    Healthy Brother     SOCIAL HISTORY: Social History   Socioeconomic History   Marital status: Married    Spouse name: Not on file   Number of children: Not on file   Years of education: Not on file   Highest education level: Not on file  Occupational History   Not on file  Tobacco Use   Smoking status: Former   Smokeless tobacco: Never  Vaping Use   Vaping Use: Never used  Substance and Sexual Activity   Alcohol use: Yes    Comment: rare to occasional   Drug use: Never   Sexual activity: Yes    Birth control/protection: None  Other Topics Concern   Not on file  Social History Narrative   Not on file   Social Determinants of Health   Financial Resource  Strain: Not on file  Food Insecurity: Not on file  Transportation Needs: Not on file  Physical Activity: Not on file  Stress: Not on file  Social Connections: Not on file  Intimate Partner Violence: Not on file    PHYSICAL EXAM  GENERAL EXAM/CONSTITUTIONAL: Vitals:  Vitals:   07/25/21 1400  BP: 131/78  Pulse: 72  Weight: 199 lb 8 oz (90.5 kg)  Height: _0  (1.676 m)   Body mass index is 32.2 kg/m. Wt Readings from Last 3 Encounters:  07/25/21 199 lb 8 oz (90.5 kg)  06/26/21 203 lb 9.6 oz (92.4 kg)  05/01/21 200 lb 8 oz (90.9 kg)   Patient is in no distress; well developed, nourished and groomed; neck is supple  EYES: Pupils round and reactive  to light, Visual fields full to confrontation, Extraocular movements intacts,   MUSCULOSKELETAL: Gait, strength, tone, movements noted in Neurologic exam below  NEUROLOGIC: MENTAL STATUS:      No data to display         awake, alert, oriented to person, place and time recent and remote memory intact normal attention and concentration language fluent, comprehension intact, naming intact fund of knowledge appropriate  CRANIAL NERVE:  2nd, 3rd, 4th, 6th - pupils equal and reactive to light, He has a right visual field cut, extraocular muscles intact, no nystagmus 5th - facial sensation symmetric 7th - facial strength symmetric 8th - hearing intact 9th - palate elevates symmetrically, uvula midline 11th - shoulder shrug symmetric 12th - tongue protrusion midline  MOTOR:  normal bulk and tone, full strength in the LUE, LLE. RUE and LLE 4+/5 strenght in all muscle groups, sometimes he will have give away weakness due to pain  SENSORY:  normal and symmetric to light touch  COORDINATION:  Slow finger-nose-finger, fine finger movements normal  REFLEXES:  deep tendon reflexes present and symmetric  GAIT/STATION:  Mild hemiplegic gait, walks with a cane     DIAGNOSTIC DATA (LABS, IMAGING, TESTING) - I reviewed  patient records, labs, notes, testing and imaging myself where available.  Lab Results  Component Value Date   WBC 7.8 03/14/2021   HGB 14.1 03/14/2021   HCT 40.7 03/14/2021   MCV 84 03/14/2021   PLT 326 03/14/2021      Component Value Date/Time   NA 140 03/14/2021 1627   K 4.6 03/14/2021 1627   CL 102 03/14/2021 1627   CO2 22 08/13/2019 0844   GLUCOSE 114 (H) 03/14/2021 1627   GLUCOSE 119 (H) 08/13/2019 0844   BUN 18 03/14/2021 1627   CREATININE 0.76 03/14/2021 1627   CALCIUM 9.5 03/14/2021 1627   PROT 7.1 03/14/2021 1627   ALBUMIN 4.6 03/14/2021 1627   AST 28 03/14/2021 1627   ALT 15 08/13/2019 0844   ALKPHOS 64 03/14/2021 1627   BILITOT 0.2 03/14/2021 1627   GFRNONAA 104 03/06/2020 1414   GFRAA 120 03/06/2020 1414   Lab Results  Component Value Date   CHOL 212 (H) 06/26/2021   HDL 49 06/26/2021   LDLCALC 131 (H) 06/26/2021   LDLDIRECT 73.0 02/18/2019   TRIG 182 (H) 06/26/2021   CHOLHDL 4.3 06/26/2021   Lab Results  Component Value Date   HGBA1C 6.5 (A) 06/26/2021   HGBA1C 6.5 06/26/2021   HGBA1C 6.5 (A) 06/26/2021   HGBA1C 6.5 06/26/2021   Lab Results  Component Value Date   HQIONGEX52 841 03/24/2019   Lab Results  Component Value Date   TSH 4.156 03/21/2019    MRI Brain 03/19/2019 1. No acute intracranial abnormality identified. 2. Subacute infarcts scattered throughout the brain parenchyma including bilateral frontoparietal regions, splenium of corpus callosum on the right side and most significantly left occipital lobe. 3. Chronic bilateral cerebellar infarcts.    ASSESSMENT AND PLAN  56 y.o. year old male with history of hypertension, hyperlipidemia, diabetes mellitus, COVID infection in 3244 complicated by multiple cardioembolic strokes.  Patient stroke etiology likely hypercoagulable state due to COVID 19.  He did completed physical therapy but still have mild right-sided deficit and bilateral lower extremities pain.  At this time my  recommendations for patient is to continue current medications, to add aspirin 81 mg daily and to continue with aggressive physical therapy.  Advised him to exercise at least 20 minutes a day 5 days  a week.  He voices understanding.  He reports applying for disability, his initial disability claim was denied but he is appealing it.  Informing him that if he needs his paperwork to be completed he can drop them in the office and I will complete it my section.  I will see him in 1 year for follow-up or sooner if worse.   1. Cerebrovascular accident (CVA) due to bilateral embolism of posterior cerebral arteries ([place])   2. Cardioembolic stroke, old, with hemiparesis ([place])   3. COVID-19 virus infection      Patient Instructions  Continue current medications  Add aspirin 81 mg daily  Continue with aggressive physical therapy  Follow up in a year   No orders of the defined types were placed in this encounter.   Meds ordered this encounter  Medications   aspirin EC 81 MG tablet    Sig: Take 1 tablet (81 mg total) by mouth daily. Swallow whole.    Dispense:  390 tablet    Refill:  0    Return in about 1 year (around 07/26/2022).  I have spent a total of 65 minutes dedicated to this patient today, preparing to see patient, performing a medically appropriate examination and evaluation, ordering tests and/or medications and procedures, and counseling and educating the patient/family/caregiver; independently interpreting result and communicating results to the family/patient/caregiver; and documenting clinical information in the electronic medical record.   Alric Ran, MD 07/25/2021, 5:38 PM  [facility] [address] [place], [place] [address] [phone]

## 2021-07-26 DIAGNOSIS — I739 Peripheral vascular disease, unspecified: Secondary | ICD-10-CM | POA: Diagnosis not present

## 2021-07-26 DIAGNOSIS — M79604 Pain in right leg: Secondary | ICD-10-CM | POA: Diagnosis not present

## 2021-07-26 DIAGNOSIS — M79605 Pain in left leg: Secondary | ICD-10-CM | POA: Diagnosis not present

## 2021-08-01 ENCOUNTER — Ambulatory Visit: Payer: Self-pay | Admitting: Nurse Practitioner

## 2021-08-13 ENCOUNTER — Encounter
Payer: Medicaid Other | Attending: Physical Medicine and Rehabilitation | Admitting: Physical Medicine and Rehabilitation

## 2021-08-13 ENCOUNTER — Encounter: Payer: Self-pay | Admitting: Physical Medicine and Rehabilitation

## 2021-08-13 VITALS — BP 135/82 | HR 74 | Ht 66.0 in

## 2021-08-13 DIAGNOSIS — G6281 Critical illness polyneuropathy: Secondary | ICD-10-CM | POA: Diagnosis not present

## 2021-08-13 DIAGNOSIS — M792 Neuralgia and neuritis, unspecified: Secondary | ICD-10-CM | POA: Diagnosis not present

## 2021-08-13 DIAGNOSIS — Z7409 Other reduced mobility: Secondary | ICD-10-CM | POA: Insufficient documentation

## 2021-08-13 DIAGNOSIS — H532 Diplopia: Secondary | ICD-10-CM | POA: Insufficient documentation

## 2021-08-13 DIAGNOSIS — S98119A Complete traumatic amputation of unspecified great toe, initial encounter: Secondary | ICD-10-CM | POA: Insufficient documentation

## 2021-08-13 DIAGNOSIS — G8111 Spastic hemiplegia affecting right dominant side: Secondary | ICD-10-CM | POA: Insufficient documentation

## 2021-08-13 DIAGNOSIS — I63119 Cerebral infarction due to embolism of unspecified vertebral artery: Secondary | ICD-10-CM | POA: Insufficient documentation

## 2021-08-13 MED ORDER — TRAMADOL HCL 50 MG PO TABS: 100.0000 mg | ORAL_TABLET | Freq: Two times a day (BID) | ORAL | 5 refills | Status: DC

## 2021-08-13 NOTE — Progress Notes (Signed)
Subjective:    Patient ID: Andrew Bruce, male    DOB: [birth year], 56 y.o.   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 Original admission 01/26/19- and came to CIR 03/23/19- discharged- 04/20/19.  Has visual difficulties- double vision due to stroke.  Here for f/u on stroke as well as nerve pain and DM.     Went to doctor at [facility] and feet always cold and has associated cramping.  He feels like there's something on bottom on feet when steps-  And gets very tired and doesn't have an strength in calves.   Last went to PT 4 months ago-  Mainly weaker on R side- still weak and hasn't gotten better.  Frustrated about this.    Hasn't been able to tolerate working- cannot tolerate- even for 20 hours/week.  Cannot even tolerate standing for 25 minutes at a time.   Said filed for disability 2 years ago.  Filled out 10/25/19- denied it in 10/22.  Filled it out again in December 2022.   Nerve pain- has helped - Keppra 10% of nerve pain meds- but also stopped taking tramadol, so likely more effective.  Still taking Lyrica for nerve pain.   Stopped taking Tramadol-   Pain Inventory Average Pain 9 Pain Right Now 9 My pain is constant, burning, stabbing, and aching  LOCATION OF PAIN  Pain in right shoulder, right side of back, right hand, right leg, right foot  BOWEL Number of stools per week: 7  BLADDER Normal  Frequent urination Yes  Leakage with coughing No  Difficulty starting stream No  Incomplete bladder emptying Yes    Mobility walk with assistance use a cane how many  minutes can you walk? 20 ability to climb steps?  yes do you drive?  yes Do you have any goals in this area?  yes  Function I need assistance with the following:  household duties and shopping Do you have any goals in this area?  yes  Neuro/Psych bladder control problems weakness numbness tremor tingling trouble walking spasms dizziness confusion depression loss of taste or smell  Prior Studies Any changes since last visit?  yes In [place] 07/26/2021  Physicians involved in your care Any changes since last visit?  yes  At [facility]  Family History  Problem Relation Age of Onset   Healthy Sister    Healthy Brother    Social History   Socioeconomic History   Marital status: Married    Spouse name: Not on file   Number of children: Not on file   Years of education: Not on file   Highest education level: Not on file  Occupational History   Not on file  Tobacco Use   Smoking status: Former   Smokeless tobacco: Never  Vaping Use   Vaping Use: Never used  Substance and Sexual Activity   Alcohol use: Yes    Comment: rare to occasional   Drug use: Never   Sexual activity: Yes    Birth control/protection: None  Other Topics Concern   Not on file  Social History Narrative   Not on file   Social Determinants of Health   Financial Resource Strain: Not on file  Food Insecurity: Not on file  Transportation Needs: Not on file  Physical Activity: Not on file  Stress: Not on file  Social Connections: Not on file   Past Surgical History:  Procedure Laterality Date   AMPUTATION Right 08/13/2019   Procedure: [place];  Surgeon: Rosetta Posner, MD;  Location: [facility];  Service: Vascular;  Laterality: Right;   DIRECT LARYNGOSCOPY Bilateral 04/06/2019   Procedure: MICRO DIRECT LARYNGOSCOPY WITH PROLARYN INJECTION;  Surgeon: Melida Quitter, MD;  Location: [facility];  Service: ENT;  Laterality: Bilateral;   IR GASTROSTOMY TUBE MOD SED   03/10/2019   IR GASTROSTOMY TUBE REMOVAL  04/20/2019   WOUND DEBRIDEMENT Left 08/13/2019   Procedure: AMPUTATION  OF LEFT FIRST TOE, AND DEBRIDEMENT OF LEFT SECOND, THIRD AND FOURTH TOES;  Surgeon: Rosetta Posner, MD;  Location: [facility] OR;  Service: Vascular;  Laterality: Left;   Past Medical History:  Diagnosis Date   COVID-19 02/06/2019   Diabetes mellitus without complication ([place])    Hypertension    PE (pulmonary thromboembolism) ([place])    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   Pneumonia    with Covid   Stroke ([place])    weakness on right side, occurred while he had Covid   Ht 5\' 6"  (1.676 m)   BMI 32.20 kg/m   Opioid Risk Score:   Fall Risk Score:  `1  Depression screen PHQ 2/9     06/26/2021    1:23 PM 05/01/2021    3:46 PM 04/30/2021    3:21 PM 03/14/2021    3:21 PM 01/29/2021    3:24 PM 01/04/2021    2:00 PM 12/15/2020   10:54 AM  Depression screen PHQ 2/9  Decreased Interest 0 1 1 0 1 0 3  Down, Depressed, Hopeless 0 1 1 0 1 0 3  PHQ - 2 Score 0 2 2 0 2 0 6  Altered sleeping 0 1       Tired, decreased energy 0 1       Change in appetite 0 1       Trouble concentrating 0 0       Moving slowly or fidgety/restless 0 0       Suicidal thoughts 0 0       PHQ-9 Score 0 5         Review of Systems  Constitutional:        No taste  Musculoskeletal:  Positive for gait problem.       Pain on right side from shoulder down to right foot.   All other systems reviewed and are negative.      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 Original admission 01/26/19- and came to CIR 03/23/19- discharged- 04/20/19.  Has visual difficulties- double vision due to stroke.  Here for f/u on stroke as well as nerve pain and DM.    File for disability- needs to put me down as person to fill out paperwork. And needs to check on it.   2. Pt is not appropriate to work- needs disability. He has had a stroke, multiple amputation; double vision due to his stroke. And R vocal fold immobility- he literally cannot close up his buttons on clothes or zipper-s needs help for dressing and bathing, much less walking and cannot read due to double vision.  -happy to speak with lawyer about his disability. .    3. Will con't Keppra/Levicetracem-  1000 mg BID- for nerve pain   4. Con't Lyrica- for nerve pain   5. Restart tramadol- but will change to 2 tabs 2x/day- for nerve pain- for nerve pain  6. Needs refills on Diabetic meds- asked pt to call PCP.    7. F/U in 3 months- double appt.   8. Let me look into shoes from medicaid for B/L TMA of feet- I haven't had this covered before, but will look into it.

## 2021-08-13 NOTE — Patient Instructions (Signed)
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 Original admission 01/26/19- and came to CIR 03/23/19- discharged- 04/20/19.  Has visual difficulties- double vision due to stroke.  Here for f/u on stroke as well as nerve pain and DM.    File for disability- needs to put me down as person to fill out paperwork. And needs to check on it.   2. Pt is not appropriate to work- needs disability. He has had a stroke, multiple amputation; double vision due to his stroke. And R vocal fold immobility- he literally cannot close up his buttons on clothes or zipper-s needs help for dressing and bathing, much less walking and cannot read due to double vision.  -happy to speak with lawyer about his disability. .    3. Will con't Keppra/Levicetracem-  1000 mg BID- for nerve pain   4. Con't Lyrica- for nerve pain   5. Restart tramadol- but will change to 2 tabs 2x/day- for nerve pain- for nerve pain  6. Needs refills on Diabetic meds- asked pt to call PCP.    7. F/U in 3 months- double appt.   8. Let me look into shoes from medicaid for B/L TMA of feet- I haven't had this covered before, but will look into it.

## 2021-08-15 ENCOUNTER — Telehealth: Payer: Self-pay

## 2021-08-15 NOTE — Telephone Encounter (Signed)
Tramadol approved through 02/15/22

## 2021-08-25 DIAGNOSIS — H5213 Myopia, bilateral: Secondary | ICD-10-CM | POA: Diagnosis not present

## 2021-08-30 DIAGNOSIS — M79672 Pain in left foot: Secondary | ICD-10-CM | POA: Diagnosis not present

## 2021-08-30 DIAGNOSIS — M79671 Pain in right foot: Secondary | ICD-10-CM | POA: Diagnosis not present

## 2021-08-30 DIAGNOSIS — F321 Major depressive disorder, single episode, moderate: Secondary | ICD-10-CM | POA: Diagnosis not present

## 2021-08-30 DIAGNOSIS — M5489 Other dorsalgia: Secondary | ICD-10-CM | POA: Diagnosis not present

## 2021-08-30 DIAGNOSIS — E118 Type 2 diabetes mellitus with unspecified complications: Secondary | ICD-10-CM | POA: Diagnosis not present

## 2021-09-06 DIAGNOSIS — S2341XA Sprain of ribs, initial encounter: Secondary | ICD-10-CM | POA: Diagnosis not present

## 2021-09-06 DIAGNOSIS — Z89411 Acquired absence of right great toe: Secondary | ICD-10-CM | POA: Diagnosis not present

## 2021-09-06 DIAGNOSIS — M5489 Other dorsalgia: Secondary | ICD-10-CM | POA: Diagnosis not present

## 2021-09-06 DIAGNOSIS — Z89412 Acquired absence of left great toe: Secondary | ICD-10-CM | POA: Diagnosis not present

## 2021-09-11 DIAGNOSIS — J386 Stenosis of larynx: Secondary | ICD-10-CM | POA: Diagnosis not present

## 2021-09-11 DIAGNOSIS — S46001A Unspecified injury of muscle(s) and tendon(s) of the rotator cuff of right shoulder, initial encounter: Secondary | ICD-10-CM | POA: Diagnosis not present

## 2021-09-11 DIAGNOSIS — M5441 Lumbago with sciatica, right side: Secondary | ICD-10-CM | POA: Diagnosis not present

## 2021-09-11 DIAGNOSIS — R499 Unspecified voice and resonance disorder: Secondary | ICD-10-CM | POA: Diagnosis not present

## 2021-09-11 DIAGNOSIS — U099 Post covid-19 condition, unspecified: Secondary | ICD-10-CM | POA: Diagnosis not present

## 2021-09-11 DIAGNOSIS — M25511 Pain in right shoulder: Secondary | ICD-10-CM | POA: Diagnosis not present

## 2021-09-11 DIAGNOSIS — M5136 Other intervertebral disc degeneration, lumbar region: Secondary | ICD-10-CM | POA: Diagnosis not present

## 2021-09-11 DIAGNOSIS — M19011 Primary osteoarthritis, right shoulder: Secondary | ICD-10-CM | POA: Diagnosis not present

## 2021-09-11 DIAGNOSIS — G8929 Other chronic pain: Secondary | ICD-10-CM | POA: Diagnosis not present

## 2021-09-11 DIAGNOSIS — G6281 Critical illness polyneuropathy: Secondary | ICD-10-CM | POA: Diagnosis not present

## 2021-09-11 DIAGNOSIS — E1165 Type 2 diabetes mellitus with hyperglycemia: Secondary | ICD-10-CM | POA: Diagnosis not present

## 2021-09-25 DIAGNOSIS — B948 Sequelae of other specified infectious and parasitic diseases: Secondary | ICD-10-CM | POA: Diagnosis not present

## 2021-09-25 DIAGNOSIS — I10 Essential (primary) hypertension: Secondary | ICD-10-CM | POA: Diagnosis not present

## 2021-09-25 DIAGNOSIS — G629 Polyneuropathy, unspecified: Secondary | ICD-10-CM | POA: Diagnosis not present

## 2021-09-25 DIAGNOSIS — R2689 Other abnormalities of gait and mobility: Secondary | ICD-10-CM | POA: Diagnosis not present

## 2021-09-25 DIAGNOSIS — Z89429 Acquired absence of other toe(s), unspecified side: Secondary | ICD-10-CM | POA: Diagnosis not present

## 2021-09-25 DIAGNOSIS — R490 Dysphonia: Secondary | ICD-10-CM | POA: Diagnosis not present

## 2021-09-25 DIAGNOSIS — R0789 Other chest pain: Secondary | ICD-10-CM | POA: Diagnosis not present

## 2021-09-25 DIAGNOSIS — J386 Stenosis of larynx: Secondary | ICD-10-CM | POA: Diagnosis not present

## 2021-09-25 DIAGNOSIS — R5381 Other malaise: Secondary | ICD-10-CM | POA: Diagnosis not present

## 2021-09-25 DIAGNOSIS — G9332 Myalgic encephalomyelitis/chronic fatigue syndrome: Secondary | ICD-10-CM | POA: Diagnosis not present

## 2021-09-25 DIAGNOSIS — U099 Post covid-19 condition, unspecified: Secondary | ICD-10-CM | POA: Diagnosis not present

## 2021-09-26 ENCOUNTER — Encounter: Payer: Self-pay | Admitting: Nurse Practitioner

## 2021-09-26 ENCOUNTER — Ambulatory Visit (INDEPENDENT_AMBULATORY_CARE_PROVIDER_SITE_OTHER): Payer: Medicaid Other | Admitting: Nurse Practitioner

## 2021-09-26 VITALS — BP 113/78 | HR 75 | Temp 97.7°F | Ht 66.0 in | Wt 193.2 lb

## 2021-09-26 DIAGNOSIS — G629 Polyneuropathy, unspecified: Secondary | ICD-10-CM | POA: Diagnosis not present

## 2021-09-26 DIAGNOSIS — R9389 Abnormal findings on diagnostic imaging of other specified body structures: Secondary | ICD-10-CM | POA: Diagnosis not present

## 2021-09-26 DIAGNOSIS — E11621 Type 2 diabetes mellitus with foot ulcer: Secondary | ICD-10-CM

## 2021-09-26 DIAGNOSIS — G8929 Other chronic pain: Secondary | ICD-10-CM

## 2021-09-26 DIAGNOSIS — L97509 Non-pressure chronic ulcer of other part of unspecified foot with unspecified severity: Secondary | ICD-10-CM

## 2021-09-26 DIAGNOSIS — M545 Low back pain, unspecified: Secondary | ICD-10-CM | POA: Diagnosis not present

## 2021-09-26 MED ORDER — METFORMIN HCL 500 MG PO TABS: 500.0000 mg | ORAL_TABLET | Freq: Two times a day (BID) | ORAL | 3 refills | Status: DC

## 2021-09-26 MED ORDER — AMLODIPINE BESYLATE 10 MG PO TABS
10.0000 mg | ORAL_TABLET | Freq: Every day | ORAL | 5 refills | Status: DC
Start: 2021-09-26 — End: 2021-12-26

## 2021-09-26 MED ORDER — GLIPIZIDE 5 MG PO TABS: 5.0000 mg | ORAL_TABLET | Freq: Every day | ORAL | 2 refills | Status: DC

## 2021-09-26 NOTE — Progress Notes (Signed)
@Patient  ID: , male    DOB: August 29, 1965, 56 y.o.   MRN: [id number]  Chief Complaint  Patient presents with   Diabetes    Pt is here 3 month's DM follow up visit. Pt states both feet is swollen,pain, numbness, fatigue pt states he has been feeling like this since Covid 3 year's ago. Pt is requesting refill on metFORMIN,amLODipine     Referring provider: [id number] I, NP   HPI  Andrew Bruce 56 y.o. male  has a past medical history of COVID-19 (02/06/2019), Diabetes mellitus without complication (HCC), Hypertension, PE (pulmonary thromboembolism) (HCC), Pneumonia, and Stroke (HCC). To the [facility] for 3 mth follow up   Diabetes:  Patient presents for diabetic follow up. Currently taking metformin and glipizide. Blood sugars at home 120-122. Complaint with diabetic diet. Taking medications. A1C is much higher at 9.4 today. Was confused about medications has only been taking metformin once daily. We discussed that he does need to take this as prescribed BID.   Hypertension:  Taking medications as directed. Checking blood pressures at home - within normal range. No issues or concerns.  Patient does still complain of ongoing foot pain. He is seeing podiatry. Was advised to follow up with podiatry regarding foot pain.   Denies f/c/s, n/v/d, hemoptysis, PND, leg swelling Denies chest pain or edema    No Known Allergies  Immunization History  Administered Date(s) Administered   Influenza,inj,Quad PF,6+ Mos 03/25/2019, 11/17/2020   Pneumococcal Polysaccharide-23 03/25/2019, 11/17/2020    Past Medical History:  Diagnosis Date   COVID-19 02/06/2019   Diabetes mellitus without complication (HCC)    Hypertension    PE (pulmonary thromboembolism) (HCC)    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   Pneumonia    with Covid   Stroke (HCC)    weakness on right side, occurred while he had Covid    Tobacco History: Social History   Tobacco Use  Smoking  Status Former  Smokeless Tobacco Never   Counseling given: Not Answered   Outpatient Encounter Medications as of 09/26/2021  Medication Sig   acetaminophen (TYLENOL) 500 MG tablet Take by mouth.   aspirin EC 81 MG tablet Take 1 tablet (81 mg total) by mouth daily. Swallow whole.   atorvastatin (LIPITOR) 20 MG tablet Take 2 tablets (40 mg total) by mouth daily at 6 PM.   DULoxetine (CYMBALTA) 60 MG capsule Take 1 capsule (60 mg total) by mouth daily. For nerve pain   Elastic Bandages & Supports (MEDICAL COMPRESSION SOCKS) MISC 1 application by Does not apply route every 12 (twelve) hours.   ibuprofen (ADVIL) 800 MG tablet Take by mouth.   levETIRAcetam (KEPPRA) 250 MG tablet Take 1 tablet (250 mg total) by mouth 2 (two) times daily. X 1 week, then 500 mg BID- for nerve pain   lisinopril (ZESTRIL) 40 MG tablet Take 1 tablet by mouth daily.   metoprolol tartrate (LOPRESSOR) 25 MG tablet Take 1 tablet (25 mg total) by mouth 2 (two) times daily. For blood pressure   pregabalin (LYRICA) 150 MG capsule Take 1 capsule (150 mg total) by mouth 3 (three) times daily. - for nerve pain   traMADol (ULTRAM) 50 MG tablet Take 2 tablets (100 mg total) by mouth 2 (two) times daily.   [DISCONTINUED] amLODipine (NORVASC) 10 MG tablet Take 1 tablet by mouth daily.   [DISCONTINUED] metFORMIN (GLUCOPHAGE) 500 MG tablet Take 1 tablet (500 mg total) by mouth 2 (two) times daily with  a meal.   amLODipine (NORVASC) 10 MG tablet Take 1 tablet (10 mg total) by mouth daily.   glipiZIDE (GLUCOTROL) 5 MG tablet Take 1 tablet (5 mg total) by mouth daily before breakfast.   melatonin (MELATONIN MAXIMUM STRENGTH) 5 MG TABS Take by mouth.   metFORMIN (GLUCOPHAGE) 500 MG tablet Take 1 tablet (500 mg total) by mouth 2 (two) times daily with a meal.   [DISCONTINUED] glipiZIDE (GLUCOTROL) 5 MG tablet Take 1 tablet by mouth daily before breakfast. (Patient not taking: Reported on 09/26/2021)   No facility-administered encounter  medications on file as of 09/26/2021.     Review of Systems  Review of Systems  Constitutional: Negative.   HENT: Negative.    Cardiovascular: Negative.   Gastrointestinal: Negative.   Allergic/Immunologic: Negative.   Neurological: Negative.   Psychiatric/Behavioral: Negative.         Physical Exam  BP 113/78 (BP Location: Left Arm, Patient Position: Sitting, Cuff Size: Large)   Pulse 75   Temp 97.7 F (36.5 C)   Ht 5\' 6"  (1.676 m)   Wt 193 lb 4 oz (87.7 kg)   SpO2 97%   BMI 31.19 kg/m   Wt Readings from Last 5 Encounters:  09/26/21 193 lb 4 oz (87.7 kg)  07/25/21 199 lb 8 oz (90.5 kg)  06/26/21 203 lb 9.6 oz (92.4 kg)  05/01/21 200 lb 8 oz (90.9 kg)  04/30/21 200 lb (90.7 kg)     Physical Exam Vitals and nursing note reviewed.  Constitutional:      General: He is not in acute distress.    Appearance: He is well-developed.  Cardiovascular:     Rate and Rhythm: Normal rate and regular rhythm.  Pulmonary:     Effort: Pulmonary effort is normal.     Breath sounds: Normal breath sounds.  Skin:    General: Skin is warm and dry.  Neurological:     Mental Status: He is alert and oriented to person, place, and time.      Lab Results:  CBC    Component Value Date/Time   WBC 8.5 09/26/2021 1620   WBC 6.1 08/13/2019 0844   RBC 4.97 09/26/2021 1620   RBC 4.82 08/13/2019 0844   HGB 14.4 09/26/2021 1620   HCT 42.5 09/26/2021 1620   PLT 292 09/26/2021 1620   MCV 86 09/26/2021 1620   MCH 29.0 09/26/2021 1620   MCH 28.0 08/13/2019 0844   MCHC 33.9 09/26/2021 1620   MCHC 32.1 08/13/2019 0844   RDW 11.8 09/26/2021 1620   LYMPHSABS 2.6 03/14/2021 1627   MONOABS 0.4 04/15/2019 0620   EOSABS 0.2 03/14/2021 1627   BASOSABS 0.0 03/14/2021 1627    BMET    Component Value Date/Time   NA 140 09/26/2021 1620   K 4.9 09/26/2021 1620   CL 103 09/26/2021 1620   CO2 21 09/26/2021 1620   GLUCOSE 271 (H) 09/26/2021 1620   GLUCOSE 119 (H) 08/13/2019 0844   BUN  25 (H) 09/26/2021 1620   CREATININE 1.06 09/26/2021 1620   CALCIUM 9.4 09/26/2021 1620   GFRNONAA 104 03/06/2020 1414   GFRAA 120 03/06/2020 1414    BNP    Component Value Date/Time   BNP 65.4 03/18/2019 0427    ProBNP No results found for: "PROBNP"  Imaging: No results found.   Assessment & Plan:   Type 2 diabetes, controlled, with ulcer of toe (HCC) - POCT glycosylated hemoglobin (Hb A1C) - metFORMIN (GLUCOPHAGE) 500 MG tablet; Take 1  tablet (500 mg total) by mouth 2 (two) times daily with a meal.  Dispense: 180 tablet; Refill: 3 - glipiZIDE (GLUCOTROL) 5 MG tablet; Take 1 tablet (5 mg total) by mouth daily before breakfast.  Dispense: 30 tablet; Refill: 2  2. Foot pain  Please follow up with podiatry  Follow up:  Follow up in 3 months or sooner if needed     [name], NP 09/27/2021

## 2021-09-26 NOTE — Patient Instructions (Addendum)
1. Type 2 diabetes, controlled, with ulcer of toe (HCC)  - POCT glycosylated hemoglobin (Hb A1C) - metFORMIN (GLUCOPHAGE) 500 MG tablet; Take 1 tablet (500 mg total) by mouth 2 (two) times daily with a meal.  Dispense: 180 tablet; Refill: 3 - glipiZIDE (GLUCOTROL) 5 MG tablet; Take 1 tablet (5 mg total) by mouth daily before breakfast.  Dispense: 30 tablet; Refill: 2  2. Foot pain  Please follow up with podiatry  Follow up:  Follow up in 3 months or sooner if needed

## 2021-09-27 ENCOUNTER — Encounter: Payer: Self-pay | Admitting: Nurse Practitioner

## 2021-09-27 DIAGNOSIS — E11621 Type 2 diabetes mellitus with foot ulcer: Secondary | ICD-10-CM | POA: Insufficient documentation

## 2021-09-27 LAB — COMPREHENSIVE METABOLIC PANEL
ALT: 33 IU/L (ref 0–44)
AST: 17 IU/L (ref 0–40)
Albumin/Globulin Ratio: 1.7 (ref 1.2–2.2)
Albumin: 4.5 g/dL (ref 3.8–4.9)
Alkaline Phosphatase: 100 IU/L (ref 44–121)
BUN/Creatinine Ratio: 24 — ABNORMAL HIGH (ref 9–20)
BUN: 25 mg/dL — ABNORMAL HIGH (ref 6–24)
Bilirubin Total: 0.3 mg/dL (ref 0.0–1.2)
CO2: 21 mmol/L (ref 20–29)
Calcium: 9.4 mg/dL (ref 8.7–10.2)
Chloride: 103 mmol/L (ref 96–106)
Creatinine, Ser: 1.06 mg/dL (ref 0.76–1.27)
Globulin, Total: 2.6 g/dL (ref 1.5–4.5)
Glucose: 271 mg/dL — ABNORMAL HIGH (ref 70–99)
Potassium: 4.9 mmol/L (ref 3.5–5.2)
Sodium: 140 mmol/L (ref 134–144)
Total Protein: 7.1 g/dL (ref 6.0–8.5)
eGFR: 83 mL/min/{1.73_m2} (ref >59)

## 2021-09-27 LAB — POCT GLYCOSYLATED HEMOGLOBIN (HGB A1C)
HbA1c POC (<> result, manual entry): 9.4 % (ref 4.0–5.6)
HbA1c, POC (controlled diabetic range): 9.4 % — AB (ref 0.0–7.0)
HbA1c, POC (prediabetic range): 9.4 % — AB (ref 5.7–6.4)
Hemoglobin A1C: 9.4 % — AB (ref 4.0–5.6)

## 2021-09-27 LAB — CBC
Hematocrit: 42.5 % (ref 37.5–51.0)
Hemoglobin: 14.4 g/dL (ref 13.0–17.7)
MCH: 29 pg (ref 26.6–33.0)
MCHC: 33.9 g/dL (ref 31.5–35.7)
MCV: 86 fL (ref 79–97)
Platelets: 292 10*3/uL (ref 150–450)
RBC: 4.97 x10E6/uL (ref 4.14–5.80)
RDW: 11.8 % (ref 11.6–15.4)
WBC: 8.5 10*3/uL (ref 3.4–10.8)

## 2021-09-27 NOTE — Assessment & Plan Note (Signed)
-   POCT glycosylated hemoglobin (Hb A1C) - metFORMIN (GLUCOPHAGE) 500 MG tablet; Take 1 tablet (500 mg total) by mouth 2 (two) times daily with a meal.  Dispense: 180 tablet; Refill: 3 - glipiZIDE (GLUCOTROL) 5 MG tablet; Take 1 tablet (5 mg total) by mouth daily before breakfast.  Dispense: 30 tablet; Refill: 2  2. Foot pain  Please follow up with podiatry  Follow up:  Follow up in 3 months or sooner if needed

## 2021-09-28 ENCOUNTER — Ambulatory Visit: Payer: Medicaid Other | Admitting: Sports Medicine

## 2021-09-28 ENCOUNTER — Encounter: Payer: Self-pay | Admitting: Sports Medicine

## 2021-09-28 VITALS — BP 131/82 | HR 76 | Ht 67.0 in | Wt 193.0 lb

## 2021-09-28 DIAGNOSIS — U099 Post covid-19 condition, unspecified: Secondary | ICD-10-CM

## 2021-09-28 DIAGNOSIS — I69398 Other sequelae of cerebral infarction: Secondary | ICD-10-CM

## 2021-09-28 DIAGNOSIS — M5441 Lumbago with sciatica, right side: Secondary | ICD-10-CM | POA: Diagnosis not present

## 2021-09-28 DIAGNOSIS — R5383 Other fatigue: Secondary | ICD-10-CM | POA: Diagnosis not present

## 2021-09-28 DIAGNOSIS — R2689 Other abnormalities of gait and mobility: Secondary | ICD-10-CM | POA: Diagnosis not present

## 2021-09-28 DIAGNOSIS — G5793 Unspecified mononeuropathy of bilateral lower limbs: Secondary | ICD-10-CM

## 2021-09-28 DIAGNOSIS — E1165 Type 2 diabetes mellitus with hyperglycemia: Secondary | ICD-10-CM | POA: Diagnosis not present

## 2021-09-28 DIAGNOSIS — M5442 Lumbago with sciatica, left side: Secondary | ICD-10-CM

## 2021-09-28 DIAGNOSIS — G8929 Other chronic pain: Secondary | ICD-10-CM

## 2021-09-28 DIAGNOSIS — Z89421 Acquired absence of other right toe(s): Secondary | ICD-10-CM

## 2021-09-28 DIAGNOSIS — S98119A Complete traumatic amputation of unspecified great toe, initial encounter: Secondary | ICD-10-CM

## 2021-09-28 MED ORDER — AMITRIPTYLINE HCL 25 MG PO TABS: 25.0000 mg | ORAL_TABLET | Freq: Every day | ORAL | 1 refills | Status: DC

## 2021-09-28 NOTE — Progress Notes (Signed)
Patient is here for bilateral leg pain and foot pain. Numbness primarily in the feet.  The top of the left foot/toe had a cut on it and is painful. This has been going on for 2.5 years.

## 2021-09-28 NOTE — Patient Instructions (Signed)
-   Stop Cymbalta - Begin taking Elavil (Amitriptyline) 25mg  (1 tablet) at nighttime  - See Podiatry - Someone will call you for swimming pool/aquatic therapy   *Dr. 

## 2021-09-28 NOTE — Progress Notes (Signed)
Andrew Bruce - 56 y.o. male MRN [id number]  Date of birth: 03-Dec-1965  Office Visit Note: Visit Date: 09/28/2021 PCP: Kathrynn Speed, NP Referred by: [name], NP  Subjective: Chief Complaint  Patient presents with   Left Leg - Pain   Right Leg - Pain   Right Foot - Numbness   Left Foot - Numbness   HPI: Andrew Bruce is a pleasant 56 y.o. male who presents today for low back pain, bilateral foot pain and numbness/tingling.  The use of an inpatient Spanish interpreter was used throughout the entirety of the visit.   He was referred to me by Andrew Seller, NP for low back pain with right-sided sciatica.  The patient underwent an extremely complicated course of COVID-19 about 3 years ago.  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  He does follow with [facility] at [facility] for this.  He does do some physical therapy for his general pains.  His back pain is not as bothersome as his bilateral foot pain today.  Occasionally he will get some right-sided back pain that radiates into the lateral thigh but nothing goes down the leg the way into the feet.  He is on a rather extensive medication list such as tramadol, Lyrica, Keppra, Cymbalta, as well as Tylenol.  He has had bilateral neuropathic pain of both feet all throughout the dorsum and sometimes plantar aspect of the feet since his complicated COVID course.  He did have transmet amputations of the right foot toes 1 and 2, left foot first right toe.  He states he never had neuropathy until this was performed.  He does have rather uncontrolled diabetes as well.  He has seen podiatry in the past, but was referred back there and has an appointment upcoming on 11/30/2021.  Pertinent ROS were reviewed with the patient and found to be negative unless otherwise specified above in HPI.   Assessment & Plan: Visit Diagnoses:  1. Chronic  right-sided low back pain with bilateral sciatica   2. Amputated great toe, unspecified laterality (HCC)   3. Uncontrolled type 2 diabetes mellitus with hyperglycemia (HCC)   4. 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 Neuropathy of both feet    Plan: I discussed with [name] today his multiple conditions, his pain was certainly complicated by his severe COVID infection and hospital course a few years ago.  In terms of his low back pain, I do not see any red flag symptoms.  I think this is exacerbated by his relative right-sided hemiparesis.  Did recommend he get into formalized physical therapy, will refer him to aquatic-based therapy at our [place] location.  Discussed his neuropathy of the bilateral feet, I think this is moreso post-amputation "phantom pain" neuropathy, but his uncontrolled diabetes is likely contributing as well.  We will switch him from his Cymbalta to amitriptyline 25 mg nightly.  He will discontinue the Cymbalta but may continue his other pain medications such as Lyrica and tramadol.  Encouraged to continue to tighten his glucose control.  I think it is essential for his bilateral foot pain to get him back in with podiatry, I think prosthetic or modified orthotics would greatly help his pain as well as his function.  I will see if I can send a message through MyChart to see if they are able to get him into the office any sooner than late November.  He  will follow-up with me after his visit with podiatry.  Follow-up: F/u after podiatry visit   Meds & Orders:  Meds ordered this encounter  Medications   amitriptyline (ELAVIL) 25 MG tablet    Sig: Take 1 tablet (25 mg total) by mouth at bedtime.    Dispense:  30 tablet    Refill:  1    Orders Placed This Encounter  Procedures   Ambulatory referral to Physical Therapy     Procedures: No procedures performed      Clinical History: No specialty comments available.  He reports that he has quit  smoking. He has never used smokeless tobacco.  Recent Labs    09/26/21 1554  HGBA1C 9.4*  9.4*  9.4  9.4*    Objective:   Vital Signs: BP 131/82   Pulse 76   Ht 5\' 7"  (1.702 m)   Wt 193 lb (87.5 kg)   BMI 30.23 kg/m   Physical Exam  Gen: Well-appearing, in no acute distress; non-toxic CV: Well-perfused. Warm.  Resp: Breathing unlabored on room air; no wheezing. Psych: Fluid speech in conversation; appropriate affect; normal thought process Neuro/MSK: Relative right-sided upper and lower hemiparesis.  Ambulates with a cane.  Ortho Exam - Low back: No midline spinous process TTP.  He has rather Ginger motion with flexion and extension.  Negative modified slump's test.  He does have right-sided hemiparesis of the lower extremity which is his baseline.  -Bilateral feet: There is trace bilateral pedal edema.  There is well-healed surgical incision from right first and second transmet amputation, left foot first great toe amputation.  No signs of redness, erythema or infection.  The patient does ambulate with in a supinated position.  Good pulses of the posterior tibial artery bilaterally.  Imaging:  *Independent review of left complete foot x-ray from 06/25/2021 which shows prior trans amputation of the first great toe. *Independent review of right complete foot x-ray from 06/25/2021 shows prior amputation of first and second toe.  There is notable vascular calcification within the posterior aspect of the ankle and foot.  *Independent review of lumbar x-rays AP and lateral films from 09/12/2021 show mild anterior wedging and bridging of T11 and T12 vertebrae.  There is relatively well served disc bases of throughout the lumbar spine.  Mild degenerative changes throughout the lower thoracic and mid lumbar spine.  No significant retro-/anterograde listhesis.  There are scattered aortic iliac atherosclerotic calcifications noted.  Past Medical/Family/Surgical/Social History: Medications &  Allergies reviewed per EMR, new medications updated. 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 07/14/2020   Uncontrolled hypertension 02/18/2020   Impaired functional mobility, balance, gait, and endurance 08/23/2019   Amputation of great toe (HCC) 08/23/2019   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   Impaired ambulation 06/11/2019   Dependent on walker for ambulation 04/30/2019   Right spastic hemiparesis (HCC) 04/30/2019   Tachycardia 04/30/2019   Dependence on cane 04/30/2019   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   Acute blood loss anemia    Uncontrolled type 2 diabetes mellitus with hyperglycemia (HCC)  Dry gangrene (HCC)    Leukocytosis    Embolic stroke ([place]) [id number]   Status post tracheostomy ([place])    Pressure injury of skin 03/02/2019   On mechanically assisted ventilation (HCC)    Goals of care, counseling/discussion    Palliative care encounter    ARDS (adult respiratory distress syndrome) (HCC)    Cerebral embolism with cerebral infarction 02/17/2019   Acute respiratory failure with hypoxia (HCC)    Pneumothorax, right    Past Medical History:  Diagnosis Date   COVID-19 02/06/2019   Diabetes mellitus without complication (HCC)    Hypertension    PE (pulmonary thromboembolism) ([place])    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   Pneumonia    with Covid   Stroke (HCC)    weakness on right side, occurred while he had Covid   Family History  Problem Relation Age of Onset   Healthy Sister    Healthy  Brother    Past Surgical History:  Procedure Laterality Date   AMPUTATION Right 08/13/2019   Procedure: AMPUTATION RIGHT FIRST AND SECOND TOES;  Surgeon: Rosetta Posner, MD;  Location: [facility] OR;  Service: Vascular;  Laterality: Right;   DIRECT LARYNGOSCOPY Bilateral 04/06/2019   Procedure: MICRO DIRECT LARYNGOSCOPY WITH PROLARYN INJECTION;  Surgeon: Melida Quitter, MD;  Location: [facility];  Service: ENT;  Laterality: Bilateral;   IR GASTROSTOMY TUBE MOD SED  03/10/2019   IR GASTROSTOMY TUBE REMOVAL  04/20/2019   WOUND DEBRIDEMENT Left 08/13/2019   Procedure: AMPUTATION  OF LEFT FIRST TOE, AND DEBRIDEMENT OF LEFT SECOND, THIRD AND FOURTH TOES;  Surgeon: Rosetta Posner, MD;  Location: [facility] OR;  Service: Vascular;  Laterality: Left;   Social History   Occupational History   Not on file  Tobacco Use   Smoking status: Former   Smokeless tobacco: Never  Vaping Use   Vaping Use: Never used  Substance and Sexual Activity   Alcohol use: Yes    Comment: rare to occasional   Drug use: Never   Sexual activity: Yes    Birth control/protection: None

## 2021-10-02 ENCOUNTER — Ambulatory Visit: Payer: Medicaid Other | Attending: Family Medicine | Admitting: Physical Therapy

## 2021-10-02 ENCOUNTER — Telehealth: Payer: Self-pay | Admitting: Physical Therapy

## 2021-10-02 DIAGNOSIS — I69351 Hemiplegia and hemiparesis following cerebral infarction affecting right dominant side: Secondary | ICD-10-CM | POA: Diagnosis present

## 2021-10-02 DIAGNOSIS — R2681 Unsteadiness on feet: Secondary | ICD-10-CM | POA: Diagnosis present

## 2021-10-02 DIAGNOSIS — M6281 Muscle weakness (generalized): Secondary | ICD-10-CM | POA: Diagnosis present

## 2021-10-02 DIAGNOSIS — R2689 Other abnormalities of gait and mobility: Secondary | ICD-10-CM | POA: Insufficient documentation

## 2021-10-02 NOTE — Therapy (Signed)
OUTPATIENT PHYSICAL THERAPY THORACOLUMBAR EVALUATION   Patient Name: Ebert Forrester MRN: [id number] DOB:09-21-1965, 56 y.o., male Today's Date: 10/02/2021   PT End of Session - 10/02/21 1516     Visit Number 1    Number of Visits 13   Plus eval   Date for PT Re-Evaluation 12/04/21    Authorization Type [employer] Medicaid    Authorization - Number of Visits 27    Progress Note Due on Visit 10    PT Start Time 1511    PT Stop Time 1608    PT Time Calculation (min) 57 min    Activity Tolerance Patient tolerated treatment well    Behavior During Therapy WFL for tasks assessed/performed             Past Medical History:  Diagnosis Date   COVID-19 02/06/2019   Diabetes mellitus without complication (HCC)    Hypertension    PE (pulmonary thromboembolism) (HCC)    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   Pneumonia    with Covid   Stroke (HCC)    weakness on right side, occurred while he had Covid   Past Surgical History:  Procedure Laterality Date   AMPUTATION Right 08/13/2019   Procedure: AMPUTATION RIGHT FIRST AND SECOND TOES;  Surgeon: Larina Earthly, MD;  Location: [facility] OR;  Service: Vascular;  Laterality: Right;   DIRECT LARYNGOSCOPY Bilateral 04/06/2019   Procedure: MICRO DIRECT LARYNGOSCOPY WITH PROLARYN INJECTION;  Surgeon: Christia Reading, MD;  Location: [facility] OR;  Service: ENT;  Laterality: Bilateral;   IR GASTROSTOMY TUBE MOD SED  03/10/2019   IR GASTROSTOMY TUBE REMOVAL  04/20/2019   WOUND DEBRIDEMENT Left 08/13/2019   Procedure: AMPUTATION  OF LEFT FIRST TOE, AND DEBRIDEMENT OF LEFT SECOND, THIRD AND FOURTH TOES;  Surgeon: Larina Earthly, MD;  Location: [facility] OR;  Service: Vascular;  Laterality: Left;   Patient Active Problem List   Diagnosis Date Noted   Type 2 diabetes, controlled, with ulcer of toe (HCC) 09/27/2021   Anxiety 07/20/2021   COVID-19 long hauler 07/20/2021   Depression 07/20/2021   History of severe acute respiratory syndrome coronavirus 2  (SARS-CoV-2) disease 07/20/2021   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   Nerve pain 12/15/2020   Diplopia 10/20/2020   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 07/14/2020   Uncontrolled hypertension 02/18/2020   Impaired functional mobility, balance, gait, and endurance 08/23/2019   Amputation of great toe (HCC) 08/23/2019   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   Impaired ambulation 06/11/2019   Dependent on walker for ambulation 04/30/2019   Right spastic hemiparesis (HCC) 04/30/2019   Tachycardia 04/30/2019   Dependence on cane 04/30/2019   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   Acute blood loss anemia    Uncontrolled type 2 diabetes mellitus with hyperglycemia (HCC)    Dry gangrene (HCC)    Leukocytosis    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   Status post tracheostomy (HCC)    Pressure injury of skin 03/02/2019   On mechanically assisted ventilation (HCC)    Goals of care, counseling/discussion    Palliative care encounter    ARDS (adult respiratory distress syndrome) (HCC)    Cerebral embolism with cerebral infarction 02/17/2019   Acute respiratory failure with hypoxia (HCC)    Pneumothorax, right     PCP: Kathrynn Speed, NP  REFERRING PROVIDER: [name], DO   REFERRING DIAG: M54.41,M54.42,G89.29 (ICD-10-CM) - Chronic right-sided  low back pain with bilateral sciatica;  G62.81 (ICD-10-CM)- polyneuropathy associated with critical illness   Rationale for Evaluation and Treatment Rehabilitation  THERAPY DIAG:  Hemiplegia and hemiparesis following cerebral infarction affecting right dominant side (HCC)  Unsteadiness on feet  Muscle weakness (generalized)  ONSET DATE: 09/28/2021   SUBJECTIVE:                                                                                                                                                                                           SUBJECTIVE  STATEMENT: Pt presents to eval w/interpreter, Nicole Kindred. Pt reports he is wanting to primarily work on his RUE as it is numb and painful, does not currently have OT referral. "I need strength in my legs and get rid of the pain in my back". Pt reports all of his symptoms started when he had Covid ~3 years ago, did not have pain before. Pt denies falls and reports he does not do much during the day due to fear of falling. Pt reports it is painful to sit for long periods due to sacral ulcer-pt not sure it is healed yet but states he feels it pulling when he is walking.    PERTINENT HISTORY:  The patient underwent an extremely complicated course of [id number] about 3 years ago.  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 He has had bilateral neuropathic pain of both feet all throughout the dorsum and sometimes plantar aspect of the feet since his complicated COVID course. He did have transmet amputations of the right foot toes 1 and 2, left foot first right toe. He states he never had neuropathy until this was performed. Low back pain, bilateral foot pain and numbness/tingling.   PAIN:  Are you having pain? Yes: NPRS scale: 7/10 Pain location: distal BLEs Pain description: burning Aggravating factors: pain is worse in the morning Relieving factors: medicine    PRECAUTIONS: Fall  WEIGHT BEARING RESTRICTIONS No  FALLS:  Has patient fallen in last 6 months? 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 No acute intracranial abnormality identified. 2. Subacute infarcts scattered throughout the brain parenchyma including bilateral frontoparietal regions, splenium of corpus callosum on the right side and most significantly left occipital lobe. 3. Chronic bilateral cerebellar infarcts.    PATIENT SURVEYS:  Modified Oswestry 38/50 (completely disabled)   SCREENING FOR RED FLAGS: Bowel or bladder incontinence: No Spinal tumors: No Cauda equina syndrome: No Compression fracture: No Abdominal aneurysm: No  COGNITION:  Overall cognitive status: Within functional limits for tasks assessed     SENSATION: Pt reports constant numbness/tingling/burning in bilateral feet   POSTURE: rounded shoulders, forward head, increased thoracic kyphosis, and posterior pelvic tilt  LOWER EXTREMITY MMT:  Tested in seated position   MMT Right eval Left eval  Hip flexion 3+ 4-  Hip extension    Hip abduction 3+ 4  Hip adduction 3 4  Hip internal rotation    Hip external rotation    Knee flexion 3+ 4  Knee extension 4- 4  Ankle dorsiflexion 3 4+  Ankle plantarflexion    Ankle inversion    Ankle eversion     (Blank rows = not tested)  LUMBAR SPECIAL TESTS:  Slump test: pt reports pain but seems to be of muscular origin bilaterally  FUNCTIONAL TESTS:  To be assessed   GAIT: Distance walked: Various clinic distances  Assistive device utilized: Single point cane Level of assistance: SBA Comments: Noted pt's cane too tall for pt and pt reports his previous therapist informed him that he would become "permanently hunched over" if his cane was not tall. Readjusted cane for pt and pt tolerated height well. Also noted pt holding cane on R side of body rather than L, so will benefit from extensive AD training.     TODAY'S TREATMENT  Next Session     PATIENT EDUCATION:  Education details: Eval findings, obtaining OT eval, proper cane height  Person educated: Patient Education method: Medical illustratorxplanation and Demonstration Education comprehension: verbalized understanding and needs further education   HOME EXERCISE PROGRAM: To be established   ASSESSMENT:  CLINICAL IMPRESSION: Patient is a 56 year old male referred to Neuro OPPT for right-sided low back pain w/bilateral sciatica. Pt's PMH is significant for: The patient underwent an extremely complicated course of COVID-19 about 3 years ago.  During this hospitalization he did have multiple cardioembolic strokes with right-sided CVA hemiparesis, multiple toe amputations, pulmonary embolism.He has had bilateral neuropathic pain of both feet all throughout the dorsum and sometimes plantar aspect of the feet since his complicated COVID course. He did have transmet amputations of the right foot toes 1 and 2, left foot first right toe. He states he never had neuropathy until this was performed. The following deficits were present during the exam: decreased strength, decreased sensation, decreased activity tolerance, decreased knowledge of condition and DME, impaired balance, decreased safety awareness and impaired gait kinematics 2/2 multiple toe amputations and history of CVA. Based on complex PMH and, pt is an incr risk for falls. Balance to be further assessed next session. Pt would benefit from skilled PT to address these impairments and functional limitations to maximize functional mobility independence.   OBJECTIVE IMPAIRMENTS Abnormal gait, cardiopulmonary status limiting activity, decreased activity tolerance, decreased balance, decreased coordination, decreased endurance, decreased knowledge of condition, decreased knowledge of use of DME, decreased mobility, difficulty walking, decreased ROM, decreased strength, decreased safety awareness, impaired perceived functional ability, impaired  sensation, impaired UE functional use, improper body mechanics, and pain.  ACTIVITY LIMITATIONS carrying, lifting, bending, squatting, stairs, transfers, bed mobility, bathing, toileting, dressing, reach over head, hygiene/grooming, locomotion level, and caring for others  PARTICIPATION LIMITATIONS: meal prep, cleaning, laundry, medication management, personal finances, interpersonal relationship, driving, shopping, community activity, occupation, and yard work  PERSONAL FACTORS Education, Fitness, Past/current experiences, Time since onset of injury/illness/exacerbation, Transportation, and 1-2 comorbidities: multiple toe amputations and history of CVA   are also affecting patient's functional outcome.   REHAB POTENTIAL: Good  CLINICAL DECISION MAKING: Evolving/moderate complexity  EVALUATION COMPLEXITY: Moderate   GOALS: Goals reviewed with patient? Yes  SHORT TERM GOALS: Target date: 10/30/2021  Pt will be independent with initial HEP for improved strength, balance, transfers and gait.  Baseline: Goal status: INITIAL  2.  Berg to be assessed and STG/LTG written  Baseline:  Goal status: INITIAL  3.  Pt will ascend/descend 12 steps using step-to pattern and L handrail w/min A and LRAD for improved independence w/household mobility  Baseline:  Goal status: INITIAL  4.  5x STS to be assessed and STG/LTG written  Baseline:  Goal status: INITIAL  5.  to be assessed and STG/LTG written  Baseline:  Goal status: INITIAL  6.  Pt will perform w/LRAD and S* on 30 day assessment and LTG written  Baseline:  Goal status: INITIAL  LONG TERM GOALS: Target date: 11/27/2021  Pt will be independent with final HEP for improved strength, balance, transfers and gait.  Baseline:  Goal status: INITIAL  2.  Berg goal  Baseline:  Goal status: INITIAL  3.  goal Baseline:  Goal status: INITIAL  4.  5xSTS goal Baseline:  Goal status: INITIAL  5.  goal   Baseline:  Goal status: INITIAL  6.  Pt will ascend/descend 12 steps using step-to pattern and L handrail w/S* for improved independence at home  Baseline:  Goal status: INITIAL   PLAN: PT FREQUENCY:  2x/week for 4 weeks and 1x/week for 4 weeks  PT DURATION: 8 weeks  PLANNED INTERVENTIONS: Therapeutic exercises, Therapeutic activity, Neuromuscular re-education, Balance training, Gait training, Patient/Family education, Self Care, Joint mobilization, Joint manipulation, Stair training, Vestibular training, Canalith repositioning, Orthotic/Fit training, DME instructions, Aquatic Therapy, Electrical stimulation, Manual therapy, and Re-evaluation.  PLAN FOR NEXT SESSION: , 5xSTS, Berg, , assess stairs, establish initial HEP, gait training w/cane    Jill Alexanders Niko Jakel, PT, DPT 10/02/2021, 4:16 PM

## 2021-10-02 NOTE — Telephone Encounter (Signed)
Dr. Rolena Infante,  [name] was evaluated by physical therapy on 10/02/21.  The patient would benefit from an occupational therapy evaluation for RUE weakness/pain due to history of CVA.    If you agree, please place an order in [facility] workque in [facility] or fax the order to [phone].  Thank you, Charlett Nose, PT, [place] [address] [place], [place]  [address] Phone:  [phone] Fax:  [phone]

## 2021-10-02 NOTE — Addendum Note (Signed)
Addended by: Renne Musca III on: 10/02/2021 05:02 PM   Modules accepted: Orders

## 2021-10-08 ENCOUNTER — Ambulatory Visit: Payer: Medicaid Other | Admitting: Physical Therapy

## 2021-10-08 DIAGNOSIS — R2689 Other abnormalities of gait and mobility: Secondary | ICD-10-CM

## 2021-10-08 DIAGNOSIS — M6281 Muscle weakness (generalized): Secondary | ICD-10-CM

## 2021-10-08 DIAGNOSIS — R2681 Unsteadiness on feet: Secondary | ICD-10-CM

## 2021-10-08 DIAGNOSIS — I69351 Hemiplegia and hemiparesis following cerebral infarction affecting right dominant side: Secondary | ICD-10-CM | POA: Diagnosis not present

## 2021-10-08 NOTE — Therapy (Signed)
OUTPATIENT PHYSICAL THERAPY THORACOLUMBAR EVALUATION   Patient Name: Andrew Bruce MRN: [id number] DOB:09-02-1965, 56 y.o., male Today's Date: 10/08/2021   PT End of Session - 10/08/21 1535     Visit Number 2    Number of Visits 13   Plus eval   Date for PT Re-Evaluation 12/04/21    Authorization Type UHC Medicaid    Authorization - Number of Visits 27    Progress Note Due on Visit 10    PT Start Time 1532    PT Stop Time 1615    PT Time Calculation (min) 43 min    Activity Tolerance Patient tolerated treatment well    Behavior During Therapy WFL for tasks assessed/performed              Past Medical History:  Diagnosis Date   COVID-19 02/06/2019   Diabetes mellitus without complication ([place])    Hypertension    PE (pulmonary thromboembolism) ([place])    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   Pneumonia    with Covid   Stroke ([place])    weakness on right side, occurred while he had Covid   Past Surgical History:  Procedure Laterality Date   AMPUTATION Right 08/13/2019   Procedure: AMPUTATION RIGHT FIRST AND SECOND TOES;  Surgeon: Rosetta Posner, MD;  Location: [facility];  Service: Vascular;  Laterality: Right;   DIRECT LARYNGOSCOPY Bilateral 04/06/2019   Procedure: MICRO DIRECT LARYNGOSCOPY WITH PROLARYN INJECTION;  Surgeon: Melida Quitter, MD;  Location: [facility];  Service: ENT;  Laterality: Bilateral;   IR GASTROSTOMY TUBE MOD SED  03/10/2019   IR GASTROSTOMY TUBE REMOVAL  04/20/2019   WOUND DEBRIDEMENT Left 08/13/2019   Procedure: AMPUTATION  OF LEFT FIRST TOE, AND DEBRIDEMENT OF LEFT SECOND, THIRD AND FOURTH TOES;  Surgeon: Rosetta Posner, MD;  Location: [facility] OR;  Service: Vascular;  Laterality: Left;   Patient Active Problem List   Diagnosis Date Noted   Type 2 diabetes, controlled, with ulcer of toe ([place]) 09/27/2021   Anxiety 07/20/2021   COVID-19 long hauler 07/20/2021   Depression 07/20/2021   History of severe acute respiratory syndrome coronavirus 2  (SARS-CoV-2) disease 07/20/2021   Polyneuropathy associated with critical illness ([place]) 07/20/2021   Nerve pain 12/15/2020   Diplopia 10/20/2020   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 07/14/2020   Uncontrolled hypertension 02/18/2020   Impaired functional mobility, balance, gait, and endurance 08/23/2019   Amputation of great toe ([place]) 08/23/2019   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   Impaired ambulation 06/11/2019   Dependent on walker for ambulation 04/30/2019   Right spastic hemiparesis ([place]) 04/30/2019   Tachycardia 04/30/2019   Dependence on cane 04/30/2019   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   Acute blood loss anemia    Uncontrolled type 2 diabetes mellitus with hyperglycemia ([place])    Dry gangrene (HCC)    Leukocytosis    Embolic stroke ([place]) 16/60/6301   Status post tracheostomy ([place])    Pressure injury of skin 03/02/2019   On mechanically assisted ventilation (HCC)    Goals of care, counseling/discussion    Palliative care encounter    ARDS (adult respiratory distress syndrome) ([place])    Cerebral embolism with cerebral infarction 02/17/2019   Acute respiratory failure with hypoxia (HCC)    Pneumothorax, right     PCP: Teena Dunk, NP  REFERRING PROVIDER: Elba Barman, DO   REFERRING DIAG: M54.41,M54.42,G89.29 (ICD-10-CM) - Chronic  right-sided low back pain with bilateral sciatica;  G62.81 (ICD-10-CM)- polyneuropathy associated with critical illness   Rationale for Evaluation and Treatment Rehabilitation  THERAPY DIAG:  Unsteadiness on feet  Muscle weakness (generalized)  Other abnormalities of gait and mobility  ONSET DATE: 09/28/2021   SUBJECTIVE:                                                                                                                                                                                           SUBJECTIVE STATEMENT: Pt presents with interpreter. Pt reports he is  feeling "so-so" today. Pt reports ongoing 7/10 pain in feet and low back that has been constant since having COVID. Pt also with some complaints of RUE pain, has OT evaluation scheduled for next week.   PERTINENT HISTORY:  The patient underwent an extremely complicated course of [id number] about 3 years ago.  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 He has had bilateral neuropathic pain of both feet all throughout the dorsum and sometimes plantar aspect of the feet since his complicated COVID course. He did have transmet amputations of the right foot toes 1 and 2, left foot first right toe. He states he never had neuropathy until this was performed. Low back pain, bilateral foot pain and numbness/tingling.   PAIN:  Are you having pain? Yes: NPRS scale: 7/10 Pain location: distal BLEs Pain description: burning Aggravating factors: pain is worse in the morning Relieving factors: medicine    PRECAUTIONS: Fall  WEIGHT BEARING RESTRICTIONS No  FALLS:  Has patient fallen in last 6 months? 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   PLOF: Independent  PATIENT GOALS "Strength in my legs and all of my body, especially my right arm and keep fighting life"    OBJECTIVE:    TODAY'S TREATMENT  THER ACT:  [facility] PT Assessment - 10/08/21 1545       Ambulation/Gait   Gait velocity 32.9 ft over 13.22 sec = 2.49 ft/sec      Standardized Balance Assessment   Standardized Balance Assessment Timed Up and Go Test;Five Times Sit to Stand    Five times sit  to stand comments  28.32 sec      Timed Up and Go  Test   TUG Normal TUG    Normal TUG (seconds) 19.41   with SPC           x 830 ft with 2 standing rest breaks, RPE of 8/10  THER EX: Initiated stretching HEP, see bolded below   PATIENT EDUCATION:  Education details: proper cane height, difference between OT and PT, OM findings, initiated HEP Person educated: Patient Education method: Explanation, Demonstration, and Handouts Education comprehension: verbalized understanding and needs further education   HOME EXERCISE PROGRAM: (Provided in Spanish to patient)  Access Code: [email or web address] URL: [email or web address] Date: 10/08/2021 Prepared by: Peter Congo  Exercises - Supine Single Knee to Chest Stretch  - 1-2 x daily - 7 x weekly - 2 sets - 5 reps - 30-60 hold - Supine Piriformis Stretch with Leg Straight  - 1 x daily - 7 x weekly - 2 sets - 5 reps - 30-60 sec hold  ASSESSMENT:  CLINICAL IMPRESSION: Emphasis of skilled PT session on assessing several functional outcome measures including 5xSTS, TUG, , and gait speed as well as initiating stretching HEP with patient. Pt exhibits increased fall risk, decreased functional LE strength, and decreased endurance based on scores as noted above. Updated STG and LTG based on scores. Pt continues to benefit from skilled therapy services to address these impairments as well as ongoing LE neuropathic pain as a result of COVID. Continue POC.   OBJECTIVE IMPAIRMENTS Abnormal gait, cardiopulmonary status limiting activity, decreased activity tolerance, decreased balance, decreased coordination, decreased endurance, decreased knowledge of condition, decreased knowledge of use of DME, decreased mobility, difficulty walking, decreased ROM, decreased strength, decreased safety awareness, impaired perceived functional ability, impaired sensation, impaired UE functional use, improper body mechanics, and pain.   ACTIVITY  LIMITATIONS carrying, lifting, bending, squatting, stairs, transfers, bed mobility, bathing, toileting, dressing, reach over head, hygiene/grooming, locomotion level, and caring for others  PARTICIPATION LIMITATIONS: meal prep, cleaning, laundry, medication management, personal finances, interpersonal relationship, driving, shopping, community activity, occupation, and yard work  PERSONAL FACTORS Education, Fitness, Past/current experiences, Time since onset of injury/illness/exacerbation, Transportation, and 1-2 comorbidities: multiple toe amputations and history of CVA   are also affecting patient's functional outcome.   REHAB POTENTIAL: Good  CLINICAL DECISION MAKING: Evolving/moderate complexity  EVALUATION COMPLEXITY: Moderate   GOALS: Goals reviewed with patient? Yes  SHORT TERM GOALS: Target date: 10/30/2021  Pt will be independent with initial HEP for improved strength, balance, transfers and gait.  Baseline: Goal status: INITIAL  2.  Berg to be assessed and STG/LTG written  Baseline:  Goal status: INITIAL  3.  Pt will ascend/descend 12 steps using step-to pattern and L handrail w/min A and LRAD for improved independence w/household mobility  Baseline:  Goal status: INITIAL  4.  Pt will improve 5 x STS to less than or equal to 25 seconds to demonstrate improved functional strength and transfer efficiency.   Baseline: 28.32 sec (9/25) Goal status: INITIAL  5.  Pt will improve gait velocity to at least 2.75 ft/sec for improved gait efficiency and performance at mod I level   Baseline: 2.49 ft/sec (9/25) Goal status: INITIAL  6.  Pt will perform w/LRAD and S* on 30 day assessment and LTG written  Baseline: 830 ft with 2 standing rest breaks (9/25) Goal status: INITIAL  LONG TERM GOALS: Target date: 11/27/2021  Pt will be independent with final HEP for improved strength, balance, transfers and gait.  Baseline:  Goal status: INITIAL  2.  Berg goal  Baseline:  Goal status: INITIAL  3.  Pt will improve gait velocity to at least 3.0 ft/sec for improved gait efficiency and performance at mod I level   Baseline: 2.49 ft/sec (9/25) Goal status: INITIAL  4.  Pt will improve 5 x STS to less than or equal to 20 seconds to demonstrate improved functional strength and transfer efficiency.   Baseline: 28.32 sec (9/25) Goal status: INITIAL  5.  Pt will ambulate greater than or equal to 1000 feet on 6MWT with LRAD at mod I level for improved cardiovascular endurance and BLE strength.   Baseline: 830 ft with 2 standing rest breaks (9/25) Goal status: INITIAL  6.  Pt will ascend/descend 12 steps using step-to pattern and L handrail w/S* for improved independence at home  Baseline:  Goal status: INITIAL   PLAN: PT FREQUENCY:  2x/week for 4 weeks and 1x/week for 4 weeks  PT DURATION: 8 weeks  PLANNED INTERVENTIONS: Therapeutic exercises, Therapeutic activity, Neuromuscular re-education, Balance training, Gait training, Patient/Family education, Self Care, Joint mobilization, Joint manipulation, Stair training, Vestibular training, Canalith repositioning, Orthotic/Fit training, DME instructions, Aquatic Therapy, Electrical stimulation, Manual therapy, and Re-evaluation.  PLAN FOR NEXT SESSION: assess Merrilee Jansky, assess stairs, add to HEP for LE stretching, balance, endurance, gait training w/cane    Excell Seltzer, PT, DPT, CSRS 10/08/2021, 4:33 PM

## 2021-10-11 ENCOUNTER — Ambulatory Visit: Payer: Medicaid Other | Admitting: Physical Therapy

## 2021-10-11 ENCOUNTER — Encounter: Payer: Self-pay | Admitting: Physical Therapy

## 2021-10-11 DIAGNOSIS — R2689 Other abnormalities of gait and mobility: Secondary | ICD-10-CM

## 2021-10-11 DIAGNOSIS — I69351 Hemiplegia and hemiparesis following cerebral infarction affecting right dominant side: Secondary | ICD-10-CM

## 2021-10-11 DIAGNOSIS — M6281 Muscle weakness (generalized): Secondary | ICD-10-CM

## 2021-10-11 DIAGNOSIS — R2681 Unsteadiness on feet: Secondary | ICD-10-CM

## 2021-10-11 NOTE — Therapy (Deleted)
OUTPATIENT PHYSICAL THERAPY THORACOLUMBAR EVALUATION   Patient Name: Andrew Bruce MRN: [id number] DOB:[birth year], 56 y.o., male Today's Date: 10/11/2021   PT End of Session - 10/11/21 1541     Visit Number 3    Number of Visits 13   Plus eval   Date for PT Re-Evaluation 12/04/21    Authorization Type [employer]    Authorization - Number of Visits 27    Progress Note Due on Visit 10    PT Start Time 1539   PT ran over w/ eval prior.   Activity Tolerance Patient tolerated treatment well    Behavior During Therapy [facility] for tasks assessed/performed              Past Medical History:  Diagnosis Date   COVID-19 02/06/2019   Diabetes mellitus without complication (HCC)    Hypertension    PE (pulmonary thromboembolism) (HCC)    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   Pneumonia    with Covid   Stroke (HCC)    weakness on right side, occurred while he had Covid   Past Surgical History:  Procedure Laterality Date   AMPUTATION Right 08/13/2019   Procedure: AMPUTATION RIGHT FIRST AND SECOND TOES;  Surgeon: Larina Earthly, MD;  Location: [facility] OR;  Service: Vascular;  Laterality: Right;   DIRECT LARYNGOSCOPY Bilateral 04/06/2019   Procedure: MICRO DIRECT LARYNGOSCOPY WITH PROLARYN INJECTION;  Surgeon: Christia Reading, MD;  Location: [facility] OR;  Service: ENT;  Laterality: Bilateral;   IR GASTROSTOMY TUBE MOD SED  03/10/2019   IR GASTROSTOMY TUBE REMOVAL  04/20/2019   WOUND DEBRIDEMENT Left 08/13/2019   Procedure: AMPUTATION  OF LEFT FIRST TOE, AND DEBRIDEMENT OF LEFT SECOND, THIRD AND FOURTH TOES;  Surgeon: Larina Earthly, MD;  Location: [facility] OR;  Service: Vascular;  Laterality: Left;   Patient Active Problem List   Diagnosis Date Noted   Type 2 diabetes, controlled, with ulcer of toe (HCC) 09/27/2021   Anxiety 07/20/2021   COVID-19 long hauler 07/20/2021   Depression 07/20/2021   History of severe acute respiratory syndrome coronavirus 2 (SARS-CoV-2) disease 07/20/2021    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   Nerve pain 12/15/2020   Diplopia 10/20/2020   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 07/14/2020   Uncontrolled hypertension 02/18/2020   Impaired functional mobility, balance, gait, and endurance 08/23/2019   Amputation of great toe (HCC) 08/23/2019   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   Impaired ambulation 06/11/2019   Dependent on walker for ambulation 04/30/2019   Right spastic hemiparesis (HCC) 04/30/2019   Tachycardia 04/30/2019   Dependence on cane 04/30/2019   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   Acute blood loss anemia    Uncontrolled type 2 diabetes mellitus with hyperglycemia (HCC)    Dry gangrene (HCC)    Leukocytosis    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   Status post tracheostomy (HCC)    Pressure injury of skin 03/02/2019   On mechanically assisted ventilation (HCC)    Goals of care, counseling/discussion    Palliative care encounter    ARDS (adult respiratory distress syndrome) (HCC)    Cerebral embolism with cerebral infarction 02/17/2019   Acute respiratory failure with hypoxia (HCC)    Pneumothorax, right     PCP: Kathrynn Speed, NP  REFERRING PROVIDER: [name], DO   REFERRING DIAG: M54.41,M54.42,G89.29 (ICD-10-CM) - Chronic right-sided low back pain with bilateral sciatica;  G62.81 (  ICD-10-CM)- polyneuropathy associated with critical illness   Rationale for Evaluation and Treatment Rehabilitation  THERAPY DIAG:  Unsteadiness on feet  Muscle weakness (generalized)  Other abnormalities of gait and mobility  Hemiplegia and hemiparesis following cerebral infarction affecting right dominant side ([place])  ONSET DATE: 09/28/2021   SUBJECTIVE:                                                                                                                                                                                            SUBJECTIVE STATEMENT: Pt presents with interpreter. Pt reports he is feeling "so-so" today. Pt states he is worried because his legs are still week for so long.  He has a blister on his left fifth ray from walking w/ pressure that he attributes to the toe amputations.  He has an appt on October 6 in [place] for "special shoes", unsure who it is with.  He sees an ENT on October 30.   PERTINENT HISTORY:  The patient underwent an extremely complicated course of MVHQI-[id number] about 3 years ago.  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 He has had bilateral neuropathic pain of both feet all throughout the dorsum and sometimes plantar aspect of the feet since his complicated COVID course. He did have transmet amputations of the right foot toes 1 and 2, left foot first right toe. He states he never had neuropathy until this was performed. Low back pain, bilateral foot pain and numbness/tingling.   PAIN:  Are you having pain? Yes: NPRS scale: 7/10 Pain location: distal BLEs Pain description: burning, tingling Aggravating factors: pain is worse in the morning Relieving factors: medicine    PRECAUTIONS: Fall  WEIGHT BEARING RESTRICTIONS No  FALLS:  Has patient fallen in last 6 months? 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  Discussed HEP.  Provided encouragement and edu related to slow strength gains over longer time with  chronicity of diagnosis.  THER ACT: -Assessed stairs:  pt ascends w/ reciprocal steps w/ 2 fingertip touch on right rail (he does not use at home due to N/T in hand) and poor sequencing of SPC, descends requesting CGA though he performs at a SBA level w/ LLE leading step to gait.  -Assessed BERG:  [facility] PT Assessment - 10/11/21 1554       Berg Balance Test   Sit to Stand Able to stand  independently using hands   hands on knees   Standing Unsupported Able to stand 30 seconds unsupported   stood just over 1 minute   Sitting with Back Unsupported but Feet Supported on Floor or Stool Able to sit safely and securely 2 minutes    Stand to Sit Controls descent by using hands    Transfers Able to transfer safely, definite need of hands    Standing Unsupported with Eyes Closed Able to stand 10 seconds with supervision   pt dizzy   Standing Unsupported with Feet Together Able to place feet together independently and stand for 1 minute with supervision    From Standing, Reach Forward with Outstretched Arm Can reach forward >5 cm safely (2")    From Standing Position, Pick up Object from Floor Able to pick up shoe, needs supervision   uses hands on thighs to return upright   From Standing Position, Turn to Look Behind Over each Shoulder Looks behind from both sides and weight shifts well    Turn 360 Degrees Able to turn 360 degrees safely but slowly   left 8 sec, right 7 sec   Standing Unsupported, Alternately Place Feet on Step/Stool Able to stand independently and complete 8 steps >20 seconds    Standing Unsupported, One Foot in Front Able to take small step independently and hold 30 seconds    Standing on One Leg Able to lift leg independently and hold equal to or more than 3 seconds   21 sec on RLE, 4 sec on LLE limited by pain    Total Score 39            THER EX: -Hamstring stretch 2x45 sec each LE -STS no UE x10 Added both to HEP.   PATIENT EDUCATION:  Education details: Additions to HEP, provided resources for purchasing corn/bunion pads until fitted shoe arrives. Person educated: Patient Education method: Explanation, Demonstration, and Handouts Education comprehension: verbalized understanding and needs further education   HOME EXERCISE PROGRAM: (Provided in Spanish to patient)  Access Code: [email or web address] URL: [email or web address] Date: 10/08/2021 Prepared by: Peter Congo  Exercises - Supine Single Knee to Chest Stretch  - 1-2 x daily - 7 x weekly - 2 sets - 5 reps - 30-60 hold - Supine Piriformis Stretch with Leg Straight  - 1 x daily - 7 x weekly - 2 sets - 5 reps - 30-60 sec hold  ASSESSMENT:  CLINICAL IMPRESSION:    OBJECTIVE IMPAIRMENTS Abnormal gait, cardiopulmonary status limiting activity, decreased activity tolerance, decreased balance, decreased coordination, decreased endurance, decreased knowledge of condition, decreased knowledge of use of DME, decreased mobility, difficulty walking, decreased ROM, decreased strength, decreased safety awareness, impaired perceived functional ability, impaired sensation, impaired UE functional use, improper body mechanics, and pain.   ACTIVITY LIMITATIONS carrying, lifting, bending, squatting, stairs, transfers, bed mobility, bathing, toileting, dressing, reach over head, hygiene/grooming, locomotion level, and caring for others  PARTICIPATION LIMITATIONS: meal prep, cleaning, laundry, medication management, personal finances, interpersonal relationship, driving, shopping, community activity, occupation, and yard work  PERSONAL FACTORS Education, Fitness, Past/current experiences, Time since onset of injury/illness/exacerbation, Transportation, and 1-2 comorbidities: multiple toe amputations and history of CVA   are also affecting  patient's functional outcome.   REHAB POTENTIAL: Good  CLINICAL DECISION MAKING: Evolving/moderate complexity  EVALUATION COMPLEXITY: Moderate   GOALS: Goals reviewed with patient? Yes  SHORT TERM GOALS: Target date: 10/30/2021  Pt will be independent with initial HEP for improved strength, balance, transfers and gait.  Baseline: Goal status: INITIAL  2.  Berg to be assessed and STG/LTG written  Baseline:  Goal status: INITIAL  3.  Pt will ascend/descend 12 steps using step-to pattern and L handrail w/min A and LRAD for improved independence w/household mobility  Baseline:  Goal status: INITIAL  4.  Pt will improve 5 x STS to less than or equal to 25 seconds to demonstrate improved functional strength and transfer efficiency.   Baseline: 28.32 sec (9/25) Goal status: INITIAL  5.  Pt will improve gait velocity to at least 2.75 ft/sec for improved gait efficiency and performance at mod I level   Baseline: 2.49 ft/sec (9/25) Goal status: INITIAL  6.  Pt will perform w/LRAD and S* on 30 day assessment and LTG written  Baseline: 830 ft with 2 standing rest breaks (9/25) Goal status: INITIAL  LONG TERM GOALS: Target date: 11/27/2021  Pt will be independent with final HEP for improved strength, balance, transfers and gait.  Baseline:  Goal status: INITIAL  2.  Berg goal  Baseline:  Goal status: INITIAL  3.  Pt will improve gait velocity to at least 3.0 ft/sec for improved gait efficiency and performance at mod I level   Baseline: 2.49 ft/sec (9/25) Goal status: INITIAL  4.  Pt will improve 5 x STS to less than or equal to 20 seconds to demonstrate improved functional strength and transfer efficiency.   Baseline: 28.32 sec (9/25) Goal status: INITIAL  5.  Pt will ambulate greater than or equal to 1000 feet on with LRAD at mod I level for improved cardiovascular endurance and BLE strength.   Baseline: 830 ft with 2 standing rest breaks (9/25) Goal  status: INITIAL  6.  Pt will ascend/descend 12 steps using step-to pattern and L handrail w/S* for improved independence at home  Baseline:  Goal status: INITIAL   PLAN: PT FREQUENCY:  2x/week for 4 weeks and 1x/week for 4 weeks  PT DURATION: 8 weeks  PLANNED INTERVENTIONS: Therapeutic exercises, Therapeutic activity, Neuromuscular re-education, Balance training, Gait training, Patient/Family education, Self Care, Joint mobilization, Joint manipulation, Stair training, Vestibular training, Canalith repositioning, Orthotic/Fit training, DME instructions, Aquatic Therapy, Electrical stimulation, Manual therapy, and Re-evaluation.  PLAN FOR NEXT SESSION:  add to HEP for LE stretching, balance, endurance, gait training w/cane    Sadie Haber, PT, DPT 10/11/2021, 3:42 PM

## 2021-10-11 NOTE — Therapy (Signed)
OUTPATIENT PHYSICAL THERAPY THORACOLUMBAR EVALUATION   Patient Name: Andrew Bruce MRN: [id number] DOB:11-01-1965, 56 y.o., male Today's Date: 10/11/2021   PT End of Session - 10/11/21 1541     Visit Number 3    Number of Visits 13   Plus eval   Date for PT Re-Evaluation 12/04/21    Authorization Type [employer]    Authorization - Number of Visits 27    Progress Note Due on Visit 10    PT Start Time 1539   PT ran over w/ eval prior.   PT Stop Time 1623    PT Time Calculation (min) 44 min    Activity Tolerance Patient tolerated treatment well    Behavior During Therapy WFL for tasks assessed/performed              Past Medical History:  Diagnosis Date   COVID-19 02/06/2019   Diabetes mellitus without complication (HCC)    Hypertension    PE (pulmonary thromboembolism) (HCC)    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   Pneumonia    with Covid   Stroke (HCC)    weakness on right side, occurred while he had Covid   Past Surgical History:  Procedure Laterality Date   AMPUTATION Right 08/13/2019   Procedure: AMPUTATION RIGHT FIRST AND SECOND TOES;  Surgeon: Larina Earthly, MD;  Location: [facility] OR;  Service: Vascular;  Laterality: Right;   DIRECT LARYNGOSCOPY Bilateral 04/06/2019   Procedure: MICRO DIRECT LARYNGOSCOPY WITH PROLARYN INJECTION;  Surgeon: Christia Reading, MD;  Location: [facility];  Service: ENT;  Laterality: Bilateral;   IR GASTROSTOMY TUBE MOD SED  03/10/2019   IR GASTROSTOMY TUBE REMOVAL  04/20/2019   WOUND DEBRIDEMENT Left 08/13/2019   Procedure: AMPUTATION  OF LEFT FIRST TOE, AND DEBRIDEMENT OF LEFT SECOND, THIRD AND FOURTH TOES;  Surgeon: Larina Earthly, MD;  Location: [facility] OR;  Service: Vascular;  Laterality: Left;   Patient Active Problem List   Diagnosis Date Noted   Type 2 diabetes, controlled, with ulcer of toe (HCC) 09/27/2021   Anxiety 07/20/2021   COVID-19 long hauler 07/20/2021   Depression 07/20/2021   History of severe acute  respiratory syndrome coronavirus 2 (SARS-CoV-2) disease 07/20/2021   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   Nerve pain 12/15/2020   Diplopia 10/20/2020   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 07/14/2020   Uncontrolled hypertension 02/18/2020   Impaired functional mobility, balance, gait, and endurance 08/23/2019   Amputation of great toe (HCC) 08/23/2019   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   Impaired ambulation 06/11/2019   Dependent on walker for ambulation 04/30/2019   Right spastic hemiparesis (HCC) 04/30/2019   Tachycardia 04/30/2019   Dependence on cane 04/30/2019   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   Acute blood loss anemia    Uncontrolled type 2 diabetes mellitus with hyperglycemia (HCC)    Dry gangrene (HCC)    Leukocytosis    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   Status post tracheostomy (HCC)    Pressure injury of skin 03/02/2019   On mechanically assisted ventilation (HCC)    Goals of care, counseling/discussion    Palliative care encounter    ARDS (adult respiratory distress syndrome) (HCC)    Cerebral embolism with cerebral infarction 02/17/2019   Acute respiratory failure with hypoxia (HCC)    Pneumothorax, right     PCP: Kathrynn Speed, NP  REFERRING PROVIDER: [name], DO  REFERRING DIAG: M54.41,M54.42,G89.29 (ICD-10-CM) - Chronic right-sided low back pain with bilateral sciatica;  G62.81 (ICD-10-CM)- polyneuropathy associated with critical illness   Rationale for Evaluation and Treatment Rehabilitation  THERAPY DIAG:  Unsteadiness on feet  Muscle weakness (generalized)  Other abnormalities of gait and mobility  Hemiplegia and hemiparesis following cerebral infarction affecting right dominant side (HCC)  ONSET DATE: 09/28/2021   SUBJECTIVE:                                                                                                                                                                                            SUBJECTIVE STATEMENT: Pt presents with interpreter-Mairani. Pt reports he is feeling "so-so" today. Pt states he is worried because his legs are still week for so long.  He has a blister on his left fifth ray from walking w/ pressure that he attributes to the toe amputations.  He has an appt on October 6 in [place] for "special shoes", unsure who it is with.  He sees an ENT on October 30.   PERTINENT HISTORY:  The patient underwent an extremely complicated course of COVID-19 about 3 years ago.  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 He has had bilateral neuropathic pain of both feet all throughout the dorsum and sometimes plantar aspect of the feet since his complicated COVID course. He did have transmet amputations of the right foot toes 1 and 2, left foot first right toe. He states he never had neuropathy until this was performed. Low back pain, bilateral foot pain and numbness/tingling.   PAIN:  Are you having pain? Yes: NPRS scale: 7/10 Pain location: distal BLEs Pain description: burning, tingling Aggravating factors: pain is worse in the morning Relieving factors: medicine    PRECAUTIONS: Fall  WEIGHT BEARING RESTRICTIONS No  FALLS:  Has patient fallen in last 6 months? 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  Discussed  HEP.  Provided encouragement and edu related to slow strength gains over longer time with chronicity of diagnosis.  THER ACT: Assessed stairs:  pt ascends w/ reciprocal steps w/ 2 fingertip touch on right rail (he does not use at home due to N/T in hand) and poor sequencing of SPC, descends requesting CGA though he performs at a SBA level w/ LLE leading step to gait.  -Assessed BERG (seated rest provided throughout due to nervousness/dizziness):  [facility] 10[facility]11/21 1554       Berg Balance Test   Sit to Stand Able to stand  independently using hands   hands on knees   Standing Unsupported Able to stand 30 seconds unsupported   stood just over 1 minute   Sitting with Back Unsupported but Feet Supported on Floor or Stool Able to sit safely and securely 2 minutes    Stand to Sit Controls descent by using hands    Transfers Able to transfer safely, definite need of hands    Standing Unsupported with Eyes Closed Able to stand 10 seconds with supervision   pt dizzy   Standing Unsupported with Feet Together Able to place feet together independently and stand for 1 minute with supervision    From Standing, Reach Forward with Outstretched Arm Can reach forward >5 cm safely (2")    From Standing Position, Pick up Object from Floor Able to pick up shoe, needs supervision   uses hands on thighs to return upright   From Standing Position, Turn to Look Behind Over each Shoulder Looks behind from both sides and weight shifts well    Turn 360 Degrees Able to turn 360 degrees safely but slowly   left 8 sec, right 7 sec   Standing Unsupported, Alternately Place Feet on Step/Stool Able to stand independently and complete 8 steps >20 seconds    Standing Unsupported, One Foot in Front Able to take small step independently and hold 30 seconds    Standing on One  Leg Able to lift leg independently and hold equal to or more than 3 seconds   21 sec on RLE, 4 sec on LLE limited by pain   Total Score 39            THER EX: -Hamstring stretch 2x45 sec each LE -STS no UE x10 Added both to HEP.   PATIENT EDUCATION:  Education details: Additions to HEP, provided resources for purchasing corn/bunion pads until fitted shoe arrives. Person educated: Patient Education method: Explanation, Demonstration, and Handouts Education comprehension: verbalized understanding and needs further education   HOME EXERCISE PROGRAM: (Provided in Spanish to patient)  Access Code: [email or web address] URL: [email or web address] Date: 10/08/2021 Prepared by: Peter Congoaylor Turkalo  Exercises - Supine Single Knee to Chest Stretch  - 1-2 x daily - 7 x weekly - 2 sets - 5 reps - 30-60 hold - Supine Piriformis Stretch with Leg Straight  - 1 x daily - 7 x weekly - 2 sets - 5 reps - 30-60 sec hold - Seated Hamstring Stretch  - 1 x daily - 7 x weekly - 1 sets - 2-3 reps - 30-45 seconds hold - Sit to Stand  - 1 x daily - 7 x weekly - 2 sets - 10 reps ASSESSMENT:  CLINICAL IMPRESSION: Assessed BERG this session with patient scoring in the significant fall risk category at a 39/56.  Pt notably anxious through all standing tasks w/o an AD.  He performs stairs demonstrating some hesitancy without CGA as he does  it at home though he does well at SBA level.  Added 2 exercises to HEP to promote flexibility and improved performance of functional transfers.  He continues to benefit from skilled PT to progress towards LTGs and decreased fall risk and restore some confidence with upright mobility.   OBJECTIVE IMPAIRMENTS Abnormal gait, cardiopulmonary status limiting activity, decreased activity tolerance, decreased balance, decreased coordination, decreased endurance, decreased knowledge of condition, decreased knowledge of use of DME, decreased mobility, difficulty walking, decreased ROM,  decreased strength, decreased safety awareness, impaired perceived functional ability, impaired sensation, impaired UE functional use, improper body mechanics, and pain.   ACTIVITY LIMITATIONS carrying, lifting, bending, squatting, stairs, transfers, bed mobility, bathing, toileting, dressing, reach over head, hygiene/grooming, locomotion level, and caring for others  PARTICIPATION LIMITATIONS: meal prep, cleaning, laundry, medication management, personal finances, interpersonal relationship, driving, shopping, community activity, occupation, and yard work  PERSONAL FACTORS Education, Fitness, Past/current experiences, Time since onset of injury/illness/exacerbation, Transportation, and 1-2 comorbidities: multiple toe amputations and history of CVA   are also affecting patient's functional outcome.   REHAB POTENTIAL: Good  CLINICAL DECISION MAKING: Evolving/moderate complexity  EVALUATION COMPLEXITY: Moderate   GOALS: Goals reviewed with patient? Yes  SHORT TERM GOALS: Target date: 10/30/2021  Pt will be independent with initial HEP for improved strength, balance, transfers and gait.  Baseline: Goal status: INITIAL  2.  Pt will increase BERG balance score to >/=45/56 to demonstrate improved static balance. Baseline: 39/56 Goal status: INITIAL  3.  Pt will ascend/descend 12 steps using step-to pattern and L handrail w/min A and LRAD for improved independence w/household mobility  Baseline:  Goal status: INITIAL  4.  Pt will improve 5 x STS to less than or equal to 25 seconds to demonstrate improved functional strength and transfer efficiency.   Baseline: 28.32 sec (9/25) Goal status: INITIAL  5.  Pt will improve gait velocity to at least 2.75 ft/sec for improved gait efficiency and performance at mod I level   Baseline: 2.49 ft/sec (9/25) Goal status: INITIAL  6.  Pt will perform w/LRAD and S* on 30 day assessment and LTG written  Baseline: 830 ft with 2 standing rest  breaks (9/25) Goal status: INITIAL  LONG TERM GOALS: Target date: 11/27/2021  Pt will be independent with final HEP for improved strength, balance, transfers and gait.  Baseline:  Goal status: INITIAL  2.  Pt will increase BERG balance score to >/=52/56 to demonstrate improved static balance. Baseline: 39/56 Goal status: INITIAL  3.  Pt will improve gait velocity to at least 3.0 ft/sec for improved gait efficiency and performance at mod I level   Baseline: 2.49 ft/sec (9/25) Goal status: INITIAL  4.  Pt will improve 5 x STS to less than or equal to 20 seconds to demonstrate improved functional strength and transfer efficiency.   Baseline: 28.32 sec (9/25) Goal status: INITIAL  5.  Pt will ambulate greater than or equal to 1000 feet on with LRAD at mod I level for improved cardiovascular endurance and BLE strength.   Baseline: 830 ft with 2 standing rest breaks (9/25) Goal status: INITIAL  6.  Pt will ascend/descend 12 steps using step-to pattern and L handrail w/S* for improved independence at home  Baseline: 10/11/2021 4 stairs CGA-SBA step-to on descent only w/ SPC and 2 fingertips on R rail. Goal status: INITIAL   PLAN: PT FREQUENCY:  2x/week for 4 weeks and 1x/week for 4 weeks  PT DURATION: 8 weeks  PLANNED INTERVENTIONS: Therapeutic  exercises, Therapeutic activity, Neuromuscular re-education, Balance training, Gait training, Patient/Family education, Self Care, Joint mobilization, Joint manipulation, Stair training, Vestibular training, Canalith repositioning, Orthotic/Fit training, DME instructions, Aquatic Therapy, Electrical stimulation, Manual therapy, and Re-evaluation.  PLAN FOR NEXT SESSION:  add to HEP for LE stretching, balance, endurance, gait training w/cane    Bary Richard, PT, DPT 10/11/2021, 4:39 PM

## 2021-10-11 NOTE — Patient Instructions (Signed)
-   Seated Hamstring Stretch  - 1 x daily - 7 x weekly - 1 sets - 2-3 reps - 30-45 seconds hold - Sit to Stand  - 1 x daily - 7 x weekly - 2 sets - 10 reps

## 2021-10-15 ENCOUNTER — Encounter: Payer: Self-pay | Admitting: Physical Therapy

## 2021-10-15 ENCOUNTER — Ambulatory Visit: Payer: Medicaid Other | Attending: Family Medicine | Admitting: Physical Therapy

## 2021-10-15 DIAGNOSIS — R208 Other disturbances of skin sensation: Secondary | ICD-10-CM | POA: Diagnosis present

## 2021-10-15 DIAGNOSIS — M6281 Muscle weakness (generalized): Secondary | ICD-10-CM | POA: Diagnosis present

## 2021-10-15 DIAGNOSIS — R41842 Visuospatial deficit: Secondary | ICD-10-CM | POA: Diagnosis present

## 2021-10-15 DIAGNOSIS — I69315 Cognitive social or emotional deficit following cerebral infarction: Secondary | ICD-10-CM | POA: Diagnosis present

## 2021-10-15 DIAGNOSIS — R278 Other lack of coordination: Secondary | ICD-10-CM | POA: Insufficient documentation

## 2021-10-15 DIAGNOSIS — R2681 Unsteadiness on feet: Secondary | ICD-10-CM | POA: Insufficient documentation

## 2021-10-15 DIAGNOSIS — R2689 Other abnormalities of gait and mobility: Secondary | ICD-10-CM | POA: Insufficient documentation

## 2021-10-15 DIAGNOSIS — I69351 Hemiplegia and hemiparesis following cerebral infarction affecting right dominant side: Secondary | ICD-10-CM | POA: Diagnosis present

## 2021-10-15 NOTE — Patient Instructions (Signed)
-   Standing Single Leg Stance with Counter Support  - 1 x daily - 4-5 x weekly - 1 sets - 3 reps - 30 seconds hold - Standing Tandem Balance with Counter Support  - 1 x daily - 5 x weekly - 1 sets - 3 reps - 30 seconds hold - Backward Walking with Counter Support  - 1 x daily - 5 x weekly - 3 sets - 10 reps - Walking with Counter Support  - 1 x daily - 5 x weekly - 3 sets - 10 reps

## 2021-10-15 NOTE — Therapy (Signed)
OUTPATIENT PHYSICAL THERAPY THORACOLUMBAR EVALUATION   Patient Name: Andrew Bruce MRN: [id number] DOB:July 29, 1965, 56 y.o., male Today's Date: 10/15/2021   PT End of Session - 10/15/21 1541     Visit Number 4    Number of Visits 13   Plus eval   Date for PT Re-Evaluation 12/04/21    Authorization Type [employer]    Authorization - Number of Visits 27    Progress Note Due on Visit 10    PT Start Time 4008   PT ran over w/ pt prior.   PT Stop Time 1618    PT Time Calculation (min) 40 min    Activity Tolerance Patient tolerated treatment well    Behavior During Therapy WFL for tasks assessed/performed              Past Medical History:  Diagnosis Date   COVID-19 02/06/2019   Diabetes mellitus without complication ([place])    Hypertension    PE (pulmonary thromboembolism) ([place])    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   Pneumonia    with Covid   Stroke ([place])    weakness on right side, occurred while he had Covid   Past Surgical History:  Procedure Laterality Date   AMPUTATION Right 08/13/2019   Procedure: AMPUTATION RIGHT FIRST AND SECOND TOES;  Surgeon: Rosetta Posner, MD;  Location: [place];  Service: Vascular;  Laterality: Right;   DIRECT LARYNGOSCOPY Bilateral 04/06/2019   Procedure: MICRO DIRECT LARYNGOSCOPY WITH PROLARYN INJECTION;  Surgeon: Melida Quitter, MD;  Location: [place];  Service: ENT;  Laterality: Bilateral;   IR GASTROSTOMY TUBE MOD SED  03/10/2019   IR GASTROSTOMY TUBE REMOVAL  04/20/2019   WOUND DEBRIDEMENT Left 08/13/2019   Procedure: AMPUTATION  OF LEFT FIRST TOE, AND DEBRIDEMENT OF LEFT SECOND, THIRD AND FOURTH TOES;  Surgeon: Rosetta Posner, MD;  Location: [facility];  Service: Vascular;  Laterality: Left;   Patient Active Problem List   Diagnosis Date Noted   Type 2 diabetes, controlled, with ulcer of toe ([place]) 09/27/2021   Anxiety 07/20/2021   COVID-19 long hauler 07/20/2021   Depression 07/20/2021   History of severe acute respiratory  syndrome coronavirus 2 (SARS-CoV-2) disease 07/20/2021   Polyneuropathy associated with critical illness ([place]) 07/20/2021   Nerve pain 12/15/2020   Diplopia 10/20/2020   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 07/14/2020   Uncontrolled hypertension 02/18/2020   Impaired functional mobility, balance, gait, and endurance 08/23/2019   Amputation of great toe ([place]) 08/23/2019   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   Impaired ambulation 06/11/2019   Dependent on walker for ambulation 04/30/2019   Right spastic hemiparesis ([place]) 04/30/2019   Tachycardia 04/30/2019   Dependence on cane 04/30/2019   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   Acute blood loss anemia    Uncontrolled type 2 diabetes mellitus with hyperglycemia ([place])    Dry gangrene (H[facility])    Leukocytosis    Embolic stroke ([address]) 67/61/9509   Status post tracheostomy ([place])    Pressure injury of skin 03/02/2019   On mechanically assisted ventilation ([place])    Goals of care, counseling/discussion    Palliative care encounter    ARDS (adult respiratory distress syndrome) ([place])    Cerebral embolism with cerebral infarction 02/17/2019   Acute respiratory failure with hypoxia ([place])    Pneumothorax, right     PCP: Teena Dunk, NP  REFERRING PROVIDER: Elba Barman, DO  REFERRING DIAG: M54.41,M54.42,G89.29 (ICD-10-CM) - Chronic right-sided low back pain with bilateral sciatica;  G62.81 (ICD-10-CM)- polyneuropathy associated with critical illness   Rationale for Evaluation and Treatment Rehabilitation  THERAPY DIAG:  Unsteadiness on feet  Muscle weakness (generalized)  Other abnormalities of gait and mobility  Hemiplegia and hemiparesis following cerebral infarction affecting right dominant side (H[facility])  ONSET DATE: 09/28/2021   SUBJECTIVE:                                                                                                                                                                                            SUBJECTIVE STATEMENT: Pt presents with interpreter-Mairani. Pt reports he is feeling "so-so" today. The blister on his fifth ray noted last visit is healing and less painful.  "I didn't have to buy the pads from last session (bunion pads).  I have been putting my feet in hot water and cold water for 20 minutes each and this has helped."   PERTINENT HISTORY:  The patient underwent an extremely complicated course of COVID-19 about 3 years ago.  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 He has had bilateral neuropathic pain of both feet all throughout the dorsum and sometimes plantar aspect of the feet since his complicated COVID course. He did have transmet amputations of the right foot toes 1 and 2, left foot first right toe. He states he never had neuropathy until this was performed. Low back pain, bilateral foot pain and numbness/tingling.   PAIN:  Are you having pain? Yes: NPRS scale: 6/10 Pain location: medial aspect of bilateral feet Pain description: numb, heavy Aggravating factors: pain is worse in the morning Relieving factors: medicine    PRECAUTIONS: Fall  WEIGHT BEARING RESTRICTIONS No  FALLS:  Has patient fallen in last 6 months? 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   PLOF: Independent  PATIENT GOALS "Strength in my legs and all of my body, especially my right arm and keep fighting life"    OBJECTIVE:   TODAY'S TREATMENT  -Pt able to complete 345' for dynamic ambulatory warmup  on level surface using SPC prior to stating dizziness; returned to sitting, PT provided water w/ symptoms returning to baseline ~3 minutes -Pt progresses from  RLE step overs of various hurdle heights 1-4" progressed to alternating LE step overs 4x25' w/ SPC and min cues for sequencing -Pt ambulates around progressively narrowed cones to practice variable turns 4x12' w/ pt reporting nervousness and visible shakiness during second bout, provided seated rest x3 minutes so pt could return to baseline, states he was more nervous with ambulation at onset of session due to feeling of imbalance. Added to HEP: -SLS 2x30 sec w/ BUE support on counter -tandem 2x30 sec w/ BUE support progressed to 2 fingertips -fwd and backward walking at countertop progressing from RUE support to none 4x10' each direction   PATIENT EDUCATION:  Education details: Additions to HEP, breathing for prevention of dizziness with activity especially when he feels nervous. Person educated: Patient Education method: Explanation, Demonstration, and Handouts Education comprehension: verbalized understanding and needs further education   HOME EXERCISE PROGRAM: (Provided in Spanish to patient)  Access Code: [email or web address] URL: [email or web address] Date: 10/08/2021 Prepared by: Peter Congo  Exercises - Supine Single Knee to Chest Stretch  - 1-2 x daily - 7 x weekly - 2 sets - 5 reps - 30-60 hold - Supine Piriformis Stretch with Leg Straight  - 1 x daily - 7 x weekly - 2 sets - 5 reps - 30-60 sec hold - Seated Hamstring Stretch  - 1 x daily - 7 x weekly - 1 sets - 2-3 reps - 30-45 seconds hold - Sit to Stand  - 1 x daily - 7 x weekly - 2 sets - 10 reps - Standing Single Leg Stance with Counter Support  - 1 x daily - 4-5 x weekly - 1 sets - 3 reps - 30 seconds hold - Standing Tandem Balance with Counter Support  - 1 x daily - 5 x weekly - 1 sets - 3 reps - 30 seconds hold - Backward Walking with Counter Support  - 1 x  daily - 5 x weekly - 3 sets - 10 reps - Walking with Counter Support  - 1 x daily - 5 x weekly - 3 sets - 10 reps   ASSESSMENT:  CLINICAL IMPRESSION: Focus of skilled session on continuing to address fear of falling and sense of imbalance w/ static and dynamic activities.  Pt continues to demonstrates mild weakness of RLE during SLS and prolonged tandem activity.  He continues to benefit from skilled PT to address ongoing deficits per POC.   OBJECTIVE IMPAIRMENTS Abnormal gait, cardiopulmonary status limiting activity, decreased activity tolerance, decreased balance, decreased coordination, decreased endurance, decreased knowledge of condition, decreased knowledge of use of DME, decreased mobility, difficulty walking, decreased ROM, decreased strength, decreased safety awareness, impaired perceived functional ability, impaired sensation, impaired UE functional use, improper body mechanics, and pain.   ACTIVITY LIMITATIONS carrying, lifting, bending, squatting, stairs, transfers, bed mobility, bathing, toileting, dressing, reach over head, hygiene/grooming, locomotion level, and caring for others  PARTICIPATION LIMITATIONS: meal prep, cleaning, laundry, medication management, personal finances, interpersonal relationship, driving, shopping, community activity, occupation, and yard work  PERSONAL FACTORS Education, Fitness, Past/current experiences, Time since onset of injury/illness/exacerbation, Transportation, and 1-2 comorbidities: multiple toe amputations and history of CVA   are also affecting patient's functional outcome.   REHAB POTENTIAL: Good  CLINICAL DECISION MAKING: Evolving/moderate complexity  EVALUATION COMPLEXITY: Moderate   GOALS: Goals reviewed with patient? Yes  SHORT TERM GOALS: Target date: 10/30/2021  Pt will be independent with initial HEP for improved strength, balance, transfers and gait.  Baseline: Goal status: INITIAL  2.  Pt will increase  BERG balance  score to >/=45/56 to demonstrate improved static balance. Baseline: 39/56 Goal status: INITIAL  3.  Pt will ascend/descend 12 steps using step-to pattern and L handrail w/min A and LRAD for improved independence w/household mobility  Baseline:  Goal status: INITIAL  4.  Pt will improve 5 x STS to less than or equal to 25 seconds to demonstrate improved functional strength and transfer efficiency.   Baseline: 28.32 sec (9/25) Goal status: INITIAL  5.  Pt will improve gait velocity to at least 2.75 ft/sec for improved gait efficiency and performance at mod I level   Baseline: 2.49 ft/sec (9/25) Goal status: INITIAL  6.  Pt will perform w/LRAD and S* on 30 day assessment and LTG written  Baseline: 830 ft with 2 standing rest breaks (9/25) Goal status: INITIAL  LONG TERM GOALS: Target date: 11/27/2021  Pt will be independent with final HEP for improved strength, balance, transfers and gait.  Baseline:  Goal status: INITIAL  2.  Pt will increase BERG balance score to >/=52/56 to demonstrate improved static balance. Baseline: 39/56 Goal status: INITIAL  3.  Pt will improve gait velocity to at least 3.0 ft/sec for improved gait efficiency and performance at mod I level   Baseline: 2.49 ft/sec (9/25) Goal status: INITIAL  4.  Pt will improve 5 x STS to less than or equal to 20 seconds to demonstrate improved functional strength and transfer efficiency.   Baseline: 28.32 sec (9/25) Goal status: INITIAL  5.  Pt will ambulate greater than or equal to 1000 feet on with LRAD at mod I level for improved cardiovascular endurance and BLE strength.   Baseline: 830 ft with 2 standing rest breaks (9/25) Goal status: INITIAL  6.  Pt will ascend/descend 12 steps using step-to pattern and L handrail w/S* for improved independence at home  Baseline: 10/11/2021 4 stairs CGA-SBA step-to on descent only w/ SPC and 2 fingertips on R rail. Goal status: INITIAL   PLAN: PT FREQUENCY:   2x/week for 4 weeks and 1x/week for 4 weeks  PT DURATION: 8 weeks  PLANNED INTERVENTIONS: Therapeutic exercises, Therapeutic activity, Neuromuscular re-education, Balance training, Gait training, Patient/Family education, Self Care, Joint mobilization, Joint manipulation, Stair training, Vestibular training, Canalith repositioning, Orthotic/Fit training, DME instructions, Aquatic Therapy, Electrical stimulation, Manual therapy, and Re-evaluation.  PLAN FOR NEXT SESSION:  add to HEP for LE stretching, balance, endurance, gait training w/cane vs w/o in // bars, high hurdles, tilt board, SciFit for endurance, foam beam/airex, eccentric heel raises   Sadie Haber, PT, DPT 10/15/2021, 4:37 PM

## 2021-10-18 ENCOUNTER — Ambulatory Visit: Payer: Medicaid Other | Admitting: Physical Therapy

## 2021-10-18 ENCOUNTER — Ambulatory Visit: Payer: Medicaid Other | Admitting: Occupational Therapy

## 2021-10-18 ENCOUNTER — Encounter: Payer: Self-pay | Admitting: Occupational Therapy

## 2021-10-18 ENCOUNTER — Encounter: Payer: Self-pay | Admitting: Physical Therapy

## 2021-10-18 DIAGNOSIS — R2681 Unsteadiness on feet: Secondary | ICD-10-CM | POA: Diagnosis not present

## 2021-10-18 DIAGNOSIS — M6281 Muscle weakness (generalized): Secondary | ICD-10-CM

## 2021-10-18 DIAGNOSIS — I69315 Cognitive social or emotional deficit following cerebral infarction: Secondary | ICD-10-CM

## 2021-10-18 DIAGNOSIS — R2689 Other abnormalities of gait and mobility: Secondary | ICD-10-CM

## 2021-10-18 DIAGNOSIS — R208 Other disturbances of skin sensation: Secondary | ICD-10-CM

## 2021-10-18 DIAGNOSIS — R278 Other lack of coordination: Secondary | ICD-10-CM

## 2021-10-18 DIAGNOSIS — I69351 Hemiplegia and hemiparesis following cerebral infarction affecting right dominant side: Secondary | ICD-10-CM

## 2021-10-18 DIAGNOSIS — R41842 Visuospatial deficit: Secondary | ICD-10-CM

## 2021-10-18 NOTE — Therapy (Signed)
OUTPATIENT PHYSICAL THERAPY THORACOLUMBAR EVALUATION   Patient Name: Andrew Bruce MRN: [id number] DOB:1965/12/08, 56 y.o., male Today's Date: 10/18/2021   PT End of Session - 10/18/21 1540     Visit Number 5    Number of Visits 13   Plus eval   Date for PT Re-Evaluation 12/04/21    Authorization Type [employer]    Authorization - Number of Visits 27    Progress Note Due on Visit 10    PT Start Time 1537    PT Stop Time 1615    PT Time Calculation (min) 38 min    Activity Tolerance Patient tolerated treatment well    Behavior During Therapy WFL for tasks assessed/performed              Past Medical History:  Diagnosis Date   COVID-19 02/06/2019   Diabetes mellitus without complication ([place])    Hypertension    PE (pulmonary thromboembolism) ([place])    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   Pneumonia    with Covid   Stroke ([place])    weakness on right side, occurred while he had Covid   Past Surgical History:  Procedure Laterality Date   AMPUTATION Right 08/13/2019   Procedure: AMPUTATION RIGHT FIRST AND SECOND TOES;  Surgeon: Andrew Posner, MD;  Location: [facility];  Service: Vascular;  Laterality: Right;   DIRECT LARYNGOSCOPY Bilateral 04/06/2019   Procedure: MICRO DIRECT LARYNGOSCOPY WITH PROLARYN INJECTION;  Surgeon: Andrew Quitter, MD;  Location: [facility];  Service: ENT;  Laterality: Bilateral;   IR GASTROSTOMY TUBE MOD SED  03/10/2019   IR GASTROSTOMY TUBE REMOVAL  04/20/2019   WOUND DEBRIDEMENT Left 08/13/2019   Procedure: AMPUTATION  OF LEFT FIRST TOE, AND DEBRIDEMENT OF LEFT SECOND, THIRD AND FOURTH TOES;  Surgeon: Andrew Posner, MD;  Location: [facility];  Service: Vascular;  Laterality: Left;   Patient Active Problem List   Diagnosis Date Noted   Type 2 diabetes, controlled, with ulcer of toe ([place]) 09/27/2021   Anxiety 07/20/2021   COVID-19 long hauler 07/20/2021   Depression 07/20/2021   History of severe acute respiratory syndrome coronavirus 2  (SARS-CoV-2) disease 07/20/2021   Polyneuropathy associated with critical illness ([place]) 07/20/2021   Nerve pain 12/15/2020   Diplopia 10/20/2020   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 07/14/2020   Uncontrolled hypertension 02/18/2020   Impaired functional mobility, balance, gait, and endurance 08/23/2019   Amputation of great toe ([place]) 08/23/2019   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   Impaired ambulation 06/11/2019   Dependent on walker for ambulation 04/30/2019   Right spastic hemiparesis ([place]) 04/30/2019   Tachycardia 04/30/2019   Dependence on cane 04/30/2019   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   Acute blood loss anemia    Uncontrolled type 2 diabetes mellitus with hyperglycemia ([place])    Dry gangrene (H[facility])    Leukocytosis    Embolic stroke ([place]) 47/65/4650   Status post tracheostomy ([place])    Pressure injury of skin 03/02/2019   On mechanically assisted ventilation ([place])    Goals of care, counseling/discussion    Palliative care encounter    ARDS (adult respiratory distress syndrome) ([place])    Cerebral embolism with cerebral infarction 02/17/2019   Acute respiratory failure with hypoxia (H[facility])    Pneumothorax, right     PCP: Andrew Dunk, NP  REFERRING PROVIDER: Elba Barman, DO   REFERRING DIAG: M54.41,M54.42,G89.29 (ICD-10-CM) - Chronic  right-sided low back pain with bilateral sciatica;  G62.81 (ICD-10-CM)- polyneuropathy associated with critical illness   Rationale for Evaluation and Treatment Rehabilitation  THERAPY DIAG:  Unsteadiness on feet  Muscle weakness (generalized)  Other abnormalities of gait and mobility  Hemiplegia and hemiparesis following cerebral infarction affecting right dominant side (HCC)  ONSET DATE: 09/28/2021   SUBJECTIVE:                                                                                                                                                                                            SUBJECTIVE STATEMENT: Pt presents with interpreter-Andrew Bruce. Pt reports he is starting to feel better.  The blister is fully closed and almost fully healed, some mild burning at the site still.  Pt reports his HEP is going well, but he still gets nervous.   PERTINENT HISTORY:  The patient underwent an extremely complicated course of COVID-19 about 3 years ago.  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 He has had bilateral neuropathic pain of both feet all throughout the dorsum and sometimes plantar aspect of the feet since his complicated COVID course. He did have transmet amputations of the right foot toes 1 and 2, left foot first right toe. He states he never had neuropathy until this was performed. Low back pain, bilateral foot pain and numbness/tingling.   PAIN:  Are you having pain? Yes: NPRS scale: 7/10 Pain location: medial aspect of bilateral feet and ankles Pain description: heavy Aggravating factors: pain is worse in the morning Relieving factors: medicine    PRECAUTIONS: Fall  WEIGHT BEARING RESTRICTIONS No  FALLS:  Has patient fallen in last 6 months? 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   PLOF: Independent  PATIENT GOALS "Strength in my legs and all of my body, especially my right arm and keep fighting life"    OBJECTIVE:   TODAY'S TREATMENT  -Bilateral gastroc stretch off 6" step 2x30sec w/ 2x12 eccentric calf raises between stretches -6" step ups progressing from LUE support to none x20 alt  LE, pt requires standing rest and coaching for breathing pattern when he becomes nervous w/ task -8" hurdles fwd w/ cane alt leading LE > no cane 4x8' each direction w/  and w/o cane > lateral stepping over hurdle w/ cues for approximation to obstacle and appropriate step size and foot placement for safety and glut engagement.  STAIRS:  Level of Assistance: SBA and CGA  Stair Negotiation Technique: Step to Pattern Alternating Pattern  Forwards with Single Rail on Right  Number of Stairs: 24   Height of Stairs: 6"  Comments: Discussion w/ pt about stair safety and guarding position for son, edu to perform stairs w/ shoes on at home for protection of TM amputated toes as pt is worried about hitting incision site on stair rise.  Discussed using rail for safety and practicing w/o the cane.  Pt is able to progress from using right rail like home setup to none w/ close supervision/SBA.  He has single posterior LOB when descending stairs due to fear of falling preventing anterior weight shifting onto leading LE.  GAIT: Gait pattern: step through pattern, decreased arm swing- Right, decreased arm swing- Left, lateral lean- Left, and wide BOS Distance walked: various clinic distances Assistive device utilized: None Level of assistance: SBA Comments: Pt grabs left thigh w/ left hand creating mild trunk lean to left.    PATIENT EDUCATION:  Education details: Continue HEP-stop and breathe when nervous.  Practice stairs w/ rail, shoes on, and son guarding, take standing rests as needed. Person educated: Patient Education method: Explanation, Demonstration, and Handouts Education comprehension: verbalized understanding and needs further education   HOME EXERCISE PROGRAM: (Provided in Spanish to patient)  Access Code: [email or web address] URL: [email or web address] Date: 10/08/2021 Prepared by: Peter Congo  Exercises - Supine Single Knee to Chest Stretch  - 1-2 x daily - 7 x weekly - 2 sets  - 5 reps - 30-60 hold - Supine Piriformis Stretch with Leg Straight  - 1 x daily - 7 x weekly - 2 sets - 5 reps - 30-60 sec hold - Seated Hamstring Stretch  - 1 x daily - 7 x weekly - 1 sets - 2-3 reps - 30-45 seconds hold - Sit to Stand  - 1 x daily - 7 x weekly - 2 sets - 10 reps - Standing Single Leg Stance with Counter Support  - 1 x daily - 4-5 x weekly - 1 sets - 3 reps - 30 seconds hold - Standing Tandem Balance with Counter Support  - 1 x daily - 5 x weekly - 1 sets - 3 reps - 30 seconds hold - Backward Walking with Counter Support  - 1 x daily - 5 x weekly - 3 sets - 10 reps - Walking with Counter Support  - 1 x daily - 5 x weekly - 3 sets - 10 reps   ASSESSMENT:  CLINICAL IMPRESSION: Focus of skilled session on progressing pt to high level balance tasks w/o reliance on cane.  His strength, though mildy asymmetrical, has progressed to the point he can perform stairs with a reciprocal pattern safely.  He continues to be limited by fear of falling and a lingering feeling of labored breathing with all activity requiring increased encouragement to adequately and safely engage to task.  Will continue to address deficits as outlined in ongoing PT POC.   OBJECTIVE IMPAIRMENTS Abnormal gait, cardiopulmonary status limiting activity, decreased activity tolerance, decreased balance, decreased coordination, decreased endurance, decreased knowledge of condition, decreased knowledge of use of DME, decreased mobility, difficulty walking, decreased ROM, decreased strength, decreased safety awareness, impaired perceived functional ability, impaired sensation, impaired UE functional use, improper body mechanics, and pain.   ACTIVITY LIMITATIONS  carrying, lifting, bending, squatting, stairs, transfers, bed mobility, bathing, toileting, dressing, reach over head, hygiene/grooming, locomotion level, and caring for others  PARTICIPATION LIMITATIONS: meal prep, cleaning, laundry, medication management,  personal finances, interpersonal relationship, driving, shopping, community activity, occupation, and yard work  PERSONAL FACTORS Education, Fitness, Past/current experiences, Time since onset of injury/illness/exacerbation, Transportation, and 1-2 comorbidities: multiple toe amputations and history of CVA   are also affecting patient's functional outcome.   REHAB POTENTIAL: Good  CLINICAL DECISION MAKING: Evolving/moderate complexity  EVALUATION COMPLEXITY: Moderate   GOALS: Goals reviewed with patient? Yes  SHORT TERM GOALS: Target date: 10/30/2021  Pt will be independent with initial HEP for improved strength, balance, transfers and gait.  Baseline: Goal status: INITIAL  2.  Pt will increase BERG balance score to >/=45/56 to demonstrate improved static balance. Baseline: 39/56 Goal status: INITIAL  3.  Pt will ascend/descend 12 steps using step-to pattern and L handrail w/min A and LRAD for improved independence w/household mobility  Baseline:  Goal status: INITIAL  4.  Pt will improve 5 x STS to less than or equal to 25 seconds to demonstrate improved functional strength and transfer efficiency.   Baseline: 28.32 sec (9/25) Goal status: INITIAL  5.  Pt will improve gait velocity to at least 2.75 ft/sec for improved gait efficiency and performance at mod I level   Baseline: 2.49 ft/sec (9/25) Goal status: INITIAL  6.  Pt will perform w/LRAD and S* on 30 day assessment and LTG written  Baseline: 830 ft with 2 standing rest breaks (9/25) Goal status: INITIAL  LONG TERM GOALS: Target date: 11/27/2021  Pt will be independent with final HEP for improved strength, balance, transfers and gait.  Baseline:  Goal status: INITIAL  2.  Pt will increase BERG balance score to >/=52/56 to demonstrate improved static balance. Baseline: 39/56 Goal status: INITIAL  3.  Pt will improve gait velocity to at least 3.0 ft/sec for improved gait efficiency and performance at mod  I level   Baseline: 2.49 ft/sec (9/25) Goal status: INITIAL  4.  Pt will improve 5 x STS to less than or equal to 20 seconds to demonstrate improved functional strength and transfer efficiency.   Baseline: 28.32 sec (9/25) Goal status: INITIAL  5.  Pt will ambulate greater than or equal to 1000 feet on with LRAD at mod I level for improved cardiovascular endurance and BLE strength.   Baseline: 830 ft with 2 standing rest breaks (9/25) Goal status: INITIAL  6.  Pt will ascend/descend 12 steps using step-to pattern and L handrail w/S* for improved independence at home  Baseline: 10/11/2021 4 stairs CGA-SBA step-to on descent only w/ SPC and 2 fingertips on R rail. Goal status: INITIAL   PLAN: PT FREQUENCY:  2x/week for 4 weeks and 1x/week for 4 weeks  PT DURATION: 8 weeks  PLANNED INTERVENTIONS: Therapeutic exercises, Therapeutic activity, Neuromuscular re-education, Balance training, Gait training, Patient/Family education, Self Care, Joint mobilization, Joint manipulation, Stair training, Vestibular training, Canalith repositioning, Orthotic/Fit training, DME instructions, Aquatic Therapy, Electrical stimulation, Manual therapy, and Re-evaluation.  PLAN FOR NEXT SESSION:  add to HEP for LE stretching, balance, endurance, gait training w/o cane-outside, tilt board, SciFit for endurance, foam beam/airex, obstacle course no AD, quad/glut strength-squats/lifting/leg press   Sadie Haber, PT, DPT 10/18/2021, 4:43 PM

## 2021-10-18 NOTE — Therapy (Signed)
OUTPATIENT OCCUPATIONAL THERAPY NEURO EVALUATION  Patient Name: Andrew Bruce MRN: [id number] DOB:[birth year], 56 y.o., male Today's Date: 10/18/2021  PCP: Teena Dunk, NP  REFERRING PROVIDER: Elba Barman, DO     OT End of Session - 10/18/21 1940     Visit Number 1    Number of Visits 13    Date for OT Re-Evaluation 01/13/22    Authorization Type [employer], 27 visit limit - no auth required - seeing PT currently    Authorization Time Period xxx    Progress Note Due on Visit 10    OT Start Time 1615    OT Stop Time 1703    OT Time Calculation (min) 48 min    Activity Tolerance Patient tolerated treatment well    Behavior During Therapy WFL for tasks assessed/performed             Past Medical History:  Diagnosis Date   COVID-19 02/06/2019   Diabetes mellitus without complication ([place])    Hypertension    PE (pulmonary thromboembolism) ([place])    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   Pneumonia    with Covid   Stroke ([place])    weakness on right side, occurred while he had Covid   Past Surgical History:  Procedure Laterality Date   AMPUTATION Right 08/13/2019   Procedure: AMPUTATION RIGHT FIRST AND SECOND TOES;  Surgeon: Rosetta Posner, MD;  Location: [facility] OR;  Service: Vascular;  Laterality: Right;   DIRECT LARYNGOSCOPY Bilateral 04/06/2019   Procedure: MICRO DIRECT LARYNGOSCOPY WITH PROLARYN INJECTION;  Surgeon: Melida Quitter, MD;  Location: [facility] OR;  Service: ENT;  Laterality: Bilateral;   IR GASTROSTOMY TUBE MOD SED  03/10/2019   IR GASTROSTOMY TUBE REMOVAL  04/20/2019   WOUND DEBRIDEMENT Left 08/13/2019   Procedure: AMPUTATION  OF LEFT FIRST TOE, AND DEBRIDEMENT OF LEFT SECOND, THIRD AND FOURTH TOES;  Surgeon: Rosetta Posner, MD;  Location: [facility] OR;  Service: Vascular;  Laterality: Left;   Patient Active Problem List   Diagnosis Date Noted   Type 2 diabetes, controlled, with ulcer of toe ([place]) 09/27/2021   Anxiety 07/20/2021   COVID-19 long  hauler 07/20/2021   Depression 07/20/2021   History of severe acute respiratory syndrome coronavirus 2 (SARS-CoV-2) disease 07/20/2021   Polyneuropathy associated with critical illness ([place]) 07/20/2021   Nerve pain 12/15/2020   Diplopia 10/20/2020   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 07/14/2020   Uncontrolled hypertension 02/18/2020   Impaired functional mobility, balance, gait, and endurance 08/23/2019   Amputation of great toe ([place]) 08/23/2019   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   Impaired ambulation 06/11/2019   Dependent on walker for ambulation 04/30/2019   Right spastic hemiparesis ([place]) 04/30/2019   Tachycardia 04/30/2019   Dependence on cane 04/30/2019   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   Acute blood loss anemia    Uncontrolled type 2 diabetes mellitus with hyperglycemia ([place])    Dry gangrene (HCC)    Leukocytosis    Embolic stroke ([place]) [id number]   Status post tracheostomy ([place])    Pressure injury of skin 03/02/2019   On mechanically assisted ventilation (HCC)    Goals of care, counseling/discussion    Palliative care encounter    ARDS (adult respiratory distress syndrome) ([place])    Cerebral embolism with cerebral infarction 02/17/2019   Acute respiratory failure with hypoxia (HCC)    Pneumothorax, right     ONSET  DATE: 09/28/2021   REFERRING DIAG:  [id number] (ICD-10-CM) - Hemiplegia and hemiparesis following cerebral infarction affecting right dominant side (HCC) R26.81 (ICD-10-CM) - Unsteadiness on feet M62.81 (ICD-10-CM) - Muscle weakness (generalized)  THERAPY DIAG:  Unsteadiness on feet  Muscle weakness (generalized)  Hemiplegia and hemiparesis following cerebral infarction affecting right dominant side (HCC)  Other lack of coordination  Other disturbances of skin sensation  Visuospatial deficit  Cognitive social or emotional deficit following cerebral infarction  Rationale for Evaluation and  Treatment Rehabilitation  SUBJECTIVE:   SUBJECTIVE STATEMENT: Gain strength in legs and arm so I don't have to depend on my family for so much.  Family is helping me with shower sometimes. Pt accompanied by: interpreter: Sarita Bottom  PERTINENT HISTORY: The patient underwent an extremely complicated course of [id number] about 3 years ago.  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 He has had bilateral neuropathic pain of both feet all throughout the dorsum and sometimes plantar aspect of the feet since his complicated COVID course. He did have transmet amputations of the right foot toes 1 and 2, left foot first right toe. He states he never had neuropathy until this was performed. Low back pain, bilateral foot pain and numbness/tingling.    PRECAUTIONS: Fall, hemiplegia  WEIGHT BEARING RESTRICTIONS No  PAIN:  Are you having pain? Yes: Pain location: 7-8/10 Pain description: aching Aggravating factors: sitting, sleeping Relieving factors: medication  FALLS: Has patient fallen in last 6 months? No  LIVING ENVIRONMENT: Lives with: lives with their family and lives with their spouse Lives in: House/apartment Stairs: Yes Has following equipment at home: shower chair and Grab bars  PLOF: Independent with basic ADLs  PATIENT GOALS Be more independent with BAD  OBJECTIVE:   HAND DOMINANCE: Right  ADLs: Overall ADLs: Min assist Transfers/ambulation related to ADLs:walks with cane Eating: Uses right hand reports it takes a long time Grooming: Uses left hand for shaving, teeth, hair, wash hair UB Dressing: cannot button shirt LB Dressing: cannot tie shoes Toileting: difficulty getting up from toilet, and pulling pants over hips Bathing: Assistance for bathing and drying self Tub Shower transfers: Hydrographic surveyor chair with back Equipment: Shower seat with back and  Grab bars   IADLs:  Light housekeeping: Fearful of falling does not participate Meal Prep: Does not cook - afraid of cutting or burning himself Community mobility: walks with cane Medication management: Relies on wife Financial management: Relies on wife Handwriting: 75% legible and Increased time   POSTURE COMMENTS:  Patient with limited trunk rotation, moves stiffly through trunk and limbs   ACTIVITY TOLERANCE: Activity tolerance: Patient fatigues quickly with activity   UPPER EXTREMITY ROM     Active ROM Right eval Left eval  Shoulder flexion 90 WFL Throughout  Shoulder abduction 110   Shoulder adduction    Shoulder extension    Shoulder internal rotation    Shoulder external rotation    Elbow flexion WFL   Elbow extension WFL   Wrist flexion WFL   Wrist extension 75%   Wrist ulnar deviation    Wrist radial deviation    Wrist pronation WFL   Wrist supination 75%   (Blank rows = not tested)   HAND FUNCTION: Grip strength: Right: 20 lbs; Left: 83.1 lbs and Lateral pinch: Right: 12 lbs, Left: 18 lbs  COORDINATION: 9 Hole Peg test: Right: 85min 06 sec; Left: NA sec  SENSATION: Light touch: Impaired  Nearly absent in thumb,and index fingers.  Did not feel deep nailbed pressure    MUSCLE TONE: RUE: Mild and Hypertonic  COGNITION: Overall cognitive status: Impaired, reports decreased memory, displays decreased insight, problem solving  VISION: Subjective report: No problems before the stroke Baseline vision: No visual deficits   VISION ASSESSMENT: Impaired Ocular ROM: restricted on looking right, restricted on looking up, and restricted on looking down Gaze preference/alignment: WDL Tracking/Visual pursuits: Left eye does not track laterally, Decreased smoothness with horizontal tracking, and Requires cues, head turns, or add eye shifts to track Diplopia assessment: objects splits side to side  Patient has difficulty with following activities due to  following visual impairments: Reading, env't navigation, driving  PERCEPTION: Impaired: Spatial orientation:    PRAXIS: WFL    TODAY'S TREATMENT:  Reviewed potential areas where OT could focus to improve function.     PATIENT EDUCATION: Education details: Potential goals Person educated: Patient Education method: Explanation Education comprehension: verbalized understanding and needs further education   HOME EXERCISE PROGRAM: TBD    GOALS: Goals reviewed with patient? No  SHORT TERM GOALS: Target date: 12/18/21 - Delaying goals x 1 month due to inability to schedule appointments until 11/30 within his timeframe needs.    Patient will complete HEP for RUE with min cueing Baseline:  Using HEP from 2021 Goal status: INITIAL  2.  Patient will demonstrate ability to tie shoes when placed in visual field (versus on feet)  Baseline: Cannot tie shoes Goal status: INITIAL  3.  Patient will report ability to shower with supervision versus physical assistance Baseline: Min assist Goal status: INITIAL  4.  Patient will demonstrate low level reach pattern without increase in pain.   Baseline: 7-8/10 pain with RUE movement Goal status: INITIAL  5.  Patient will demonstrate awareness of compensatory strategies to help with visual deficits Baseline: Reports diplopia, and demonstrates decreased gaze stabilization Goal status: INITIAL   LONG TERM GOALS: Target date: 01/13/22  Patient will complete updated HEP designed to improve active range of motion, reduce pain, and improve functional use of RUE.   Baseline: No updated HEP Goal status: INITIAL  2.  Patient will bathe and dress himself in athletic type clothing with modified independence Baseline: min assist Goal status: INITIAL  3.  Patient will tie his shoes (on his feet) and button a shirt with supervision/ verbal cueing.   Baseline: Cannot tie or button Goal status: INITIAL  4.  Patient will demonstrate ability to  read at sentence level, compensating for visual impairments.   Baseline: Patient reports missing words and then text does not make sense.   Goal status: INITIAL  5.  Patient will demonstrate mid to high level reach (100-130*) with RUE to obtain a lightweight object (less than 2 lbs) off shelf without a significant increase in pain in R arm.   Baseline: 7-8/10 Goal status: INITIAL  6.  Patient will demonstrate 10 lb increase in grip strength in Right hand to aide with his ability to carry items Baseline: 20 lb Goal status: INITIAL  ASSESSMENT:  CLINICAL IMPRESSION: Patient is a 56 y.o. male who was seen today for occupational therapy evaluation for Hemiplegia following multiple infarcts.   PERFORMANCE DEFICITS in functional skills including ADLs, IADLs, coordination, dexterity, proprioception, sensation, tone, ROM, strength, pain, fascial restrictions, flexibility, FMC, GMC, mobility, balance, body mechanics, endurance, cardiopulmonary status limiting function, vision, and UE functional use, cognitive skills including attention, emotional, learn, memory, perception, problem solving, and safety awareness, and psychosocial  skills including coping strategies, habits, and routines and behaviors.   IMPAIRMENTS are limiting patient from ADLs, IADLs, rest and sleep, work, and leisure.   COMORBIDITIES may have co-morbidities  that affects occupational performance. Patient will benefit from skilled OT to address above impairments and improve overall function.  MODIFICATION OR ASSISTANCE TO COMPLETE EVALUATION: Min-Moderate modification of tasks or assist with assess necessary to complete an evaluation.  OT OCCUPATIONAL PROFILE AND HISTORY: Detailed assessment: Review of records and additional review of physical, cognitive, psychosocial history related to current functional performance.  CLINICAL DECISION MAKING: Moderate - several treatment options, min-mod task modification necessary  REHAB  POTENTIAL: Good  EVALUATION COMPLEXITY: Moderate    PLAN: OT FREQUENCY: 2x/week  OT DURATION: 6 weeks  PLANNED INTERVENTIONS: self care/ADL training, therapeutic exercise, therapeutic activity, neuromuscular re-education, manual therapy, passive range of motion, balance training, functional mobility training, aquatic therapy, splinting, ultrasound, fluidotherapy, moist heat, cryotherapy, contrast bath, patient/family education, cognitive remediation/compensation, visual/perceptual remediation/compensation, energy conservation, coping strategies training, and DME and/or AE instructions  RECOMMENDED OTHER SERVICES: Neuropsych, Neuro-optometry  CONSULTED AND AGREED WITH PLAN OF CARE: Patient  PLAN FOR NEXT SESSION: Assess current HEP Supine shoulder exercises (from 2021) and determine if still appropriate, needs trunk mobility and shoulder range passive to active assisted.  Needs functional use of RUE - has movement but lacks sensation so limited use.     Mariah Milling, OT 10/18/2021, 7:42 PM

## 2021-10-19 DIAGNOSIS — U099 Post covid-19 condition, unspecified: Secondary | ICD-10-CM | POA: Diagnosis not present

## 2021-10-19 DIAGNOSIS — M2550 Pain in unspecified joint: Secondary | ICD-10-CM | POA: Diagnosis not present

## 2021-10-19 DIAGNOSIS — E1142 Type 2 diabetes mellitus with diabetic polyneuropathy: Secondary | ICD-10-CM | POA: Diagnosis not present

## 2021-10-19 DIAGNOSIS — G8929 Other chronic pain: Secondary | ICD-10-CM | POA: Diagnosis not present

## 2021-10-19 DIAGNOSIS — G6281 Critical illness polyneuropathy: Secondary | ICD-10-CM | POA: Diagnosis not present

## 2021-10-22 ENCOUNTER — Ambulatory Visit: Payer: Medicaid Other | Admitting: Physical Therapy

## 2021-10-22 VITALS — BP 118/78 | HR 79

## 2021-10-22 DIAGNOSIS — R2681 Unsteadiness on feet: Secondary | ICD-10-CM

## 2021-10-22 DIAGNOSIS — M6281 Muscle weakness (generalized): Secondary | ICD-10-CM

## 2021-10-22 DIAGNOSIS — R278 Other lack of coordination: Secondary | ICD-10-CM

## 2021-10-22 NOTE — Therapy (Signed)
OUTPATIENT PHYSICAL THERAPY THORACOLUMBAR TREATMENT   Patient Name: Andrew Bruce MRN: [id number] DOB:[birth year], 56 y.o., male Today's Date: 10/22/2021   PT End of Session - 10/22/21 1535     Visit Number 6    Number of Visits 13   Plus eval   Date for PT Re-Evaluation 12/04/21    Authorization Type [employer]    Authorization - Number of Visits 27    Progress Note Due on Visit 10    PT Start Time 1533    PT Stop Time 1620    PT Time Calculation (min) 47 min    Equipment Utilized During Treatment Gait belt    Activity Tolerance Patient tolerated treatment well    Behavior During Therapy WFL for tasks assessed/performed;Anxious               Past Medical History:  Diagnosis Date   COVID-19 02/06/2019   Diabetes mellitus without complication (HCC)    Hypertension    PE (pulmonary thromboembolism) (HCC)    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   Pneumonia    with Covid   Stroke (HCC)    weakness on right side, occurred while he had Covid   Past Surgical History:  Procedure Laterality Date   AMPUTATION Right 08/13/2019   Procedure: AMPUTATION RIGHT FIRST AND SECOND TOES;  Surgeon: Larina Earthly, MD;  Location: [facility] OR;  Service: Vascular;  Laterality: Right;   DIRECT LARYNGOSCOPY Bilateral 04/06/2019   Procedure: MICRO DIRECT LARYNGOSCOPY WITH PROLARYN INJECTION;  Surgeon: Christia Reading, MD;  Location: [facility] OR;  Service: ENT;  Laterality: Bilateral;   IR GASTROSTOMY TUBE MOD SED  03/10/2019   IR GASTROSTOMY TUBE REMOVAL  04/20/2019   WOUND DEBRIDEMENT Left 08/13/2019   Procedure: AMPUTATION  OF LEFT FIRST TOE, AND DEBRIDEMENT OF LEFT SECOND, THIRD AND FOURTH TOES;  Surgeon: Larina Earthly, MD;  Location: [facility] OR;  Service: Vascular;  Laterality: Left;   Patient Active Problem List   Diagnosis Date Noted   Type 2 diabetes, controlled, with ulcer of toe (HCC) 09/27/2021   Anxiety 07/20/2021   COVID-19 long hauler 07/20/2021   Depression 07/20/2021    History of severe acute respiratory syndrome coronavirus 2 (SARS-CoV-2) disease 07/20/2021   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   Nerve pain 12/15/2020   Diplopia 10/20/2020   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 07/14/2020   Uncontrolled hypertension 02/18/2020   Impaired functional mobility, balance, gait, and endurance 08/23/2019   Amputation of great toe (HCC) 08/23/2019   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   Impaired ambulation 06/11/2019   Dependent on walker for ambulation 04/30/2019   Right spastic hemiparesis (HCC) 04/30/2019   Tachycardia 04/30/2019   Dependence on cane 04/30/2019   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   Acute blood loss anemia    Uncontrolled type 2 diabetes mellitus with hyperglycemia (HCC)    Dry gangrene (HCC)    Leukocytosis    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   Status post tracheostomy (HCC)    Pressure injury of skin 03/02/2019   On mechanically assisted ventilation (HCC)    Goals of care, counseling/discussion    Palliative care encounter    ARDS (adult respiratory distress syndrome) (HCC)    Cerebral embolism with cerebral infarction 02/17/2019   Acute respiratory failure with hypoxia (HCC)    Pneumothorax, right     PCP: Kathrynn Speed, NP  REFERRING PROVIDER: Shon Baton,  Hinton Dyer, DO   REFERRING DIAG: M54.41,M54.42,G89.29 (ICD-10-CM) - Chronic right-sided low back pain with bilateral sciatica;  G62.81 (ICD-10-CM)- polyneuropathy associated with critical illness   Rationale for Evaluation and Treatment Rehabilitation  THERAPY DIAG:  Unsteadiness on feet  Muscle weakness (generalized)  Other lack of coordination  ONSET DATE: 09/28/2021   SUBJECTIVE:                                                                                                                                                                                           SUBJECTIVE  STATEMENT: Pt presents with interpreter-Lucy. Pt reports he feels as though he is getting stronger in his legs. Still having high pain levels but is doing his exercises. States he gets shortness of breath with his HEP, has a surgery to repair his vocal cords on 10/30 and he is hopeful that will help.   PERTINENT HISTORY:  The patient underwent an extremely complicated course of OEUMP-53 about 3 years ago.  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 He has had bilateral neuropathic pain of both feet all throughout the dorsum and sometimes plantar aspect of the feet since his complicated COVID course. He did have transmet amputations of the right foot toes 1 and 2, left foot first right toe. He states he never had neuropathy until this was performed. Low back pain, bilateral foot pain and numbness/tingling.   PAIN:  Are you having pain? Yes: NPRS scale: 7/10 Pain location: medial aspect of bilateral feet and ankles Pain description: heavy Aggravating factors: pain is worse in the morning Relieving factors: medicine    PRECAUTIONS: Fall  WEIGHT BEARING RESTRICTIONS No  FALLS:  Has patient fallen in last 6 months? 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  Progressed to minisquats, 2x8 reps. Pt continued to require cues for breathing and demonstrated  weight shift to L side due to fear of RLE buckling on him  On rockerboard, alt cone taps without UE support, x10  per side. Noted increased difficulty tapping w/LLE and poor foot placement of LLE on board. Min cues for proper placement (hip width apart, side by side) for improved stability.  Progressed to double cone taps for added single leg stability, x10 per side w/intermittent UE support for balance. Pt able to correct L foot placement throughout without additional cues  With Bosu blue side up, alt step up w/contralateral march, x15 per side. Light BUE support throughout for stability, but pt able to hold at top of rep for 2-3s without UE support.   Ther Ex  SciFit multi-peaks level 2 for 8 minutes using BUE/BLEs for dynamic cardiovascular conditioning, BLE strength and UE/LE coordination. RPE of 9/10 following activity    GAIT: Gait pattern: step through pattern, decreased arm swing- Right, decreased arm swing- Left, lateral lean- Left, and wide BOS Distance walked: various clinic distances Assistive device utilized: Single point cane Level of assistance: SBA Comments: No instability noted this date     PATIENT EDUCATION:  Education details: Continue HEP-stop and breathe when nervous.   Person educated: Patient Education method: Explanation, Demonstration, and Handouts Education comprehension: verbalized understanding and needs further education   HOME EXERCISE PROGRAM: (Provided in Spanish to patient)  Access Code: [email or web address] URL: [email or web address] Date: 10/08/2021 Prepared by: Peter Congo  Exercises - Supine Single Knee to Chest Stretch  - 1-2 x daily - 7 x weekly - 2 sets - 5 reps - 30-60 hold - Supine Piriformis Stretch with Leg Straight  - 1 x daily - 7 x weekly - 2 sets - 5 reps - 30-60 sec hold - Seated Hamstring Stretch  - 1 x daily - 7 x weekly - 1 sets - 2-3 reps - 30-45 seconds hold - Sit to Stand  - 1 x daily - 7 x weekly - 2 sets - 10 reps -  Standing Single Leg Stance with Counter Support  - 1 x daily - 4-5 x weekly - 1 sets - 3 reps - 30 seconds hold - Standing Tandem Balance with Counter Support  - 1 x daily - 5 x weekly - 1 sets - 3 reps - 30 seconds hold - Backward Walking with Counter Support  - 1 x daily - 5 x weekly - 3 sets - 10 reps - Walking with Counter Support  - 1 x daily - 5 x weekly - 3 sets - 10 reps   ASSESSMENT:  CLINICAL IMPRESSION: Emphasis of skilled PT session on single leg stability, ankle strategy and global strengthening. Pt continues to demonstrate fear-avoidance behavior w/difficult tasks (unlevel surfaces, single leg stance) and requires mod-max cues for diaphragmatic breathing. Pt demonstrates improved stability of RLE but poor foot placement w/LLE, resulting in narrow BOS. Pt able to better place L foot w/cues. Continue POC.    OBJECTIVE IMPAIRMENTS Abnormal gait, cardiopulmonary status limiting activity, decreased activity tolerance, decreased balance, decreased coordination, decreased endurance, decreased knowledge of condition, decreased knowledge of use of DME, decreased mobility, difficulty walking, decreased ROM, decreased strength, decreased safety awareness, impaired perceived functional ability, impaired sensation, impaired UE functional use, improper body mechanics, and pain.   ACTIVITY LIMITATIONS carrying, lifting, bending, squatting, stairs, transfers, bed mobility, bathing, toileting, dressing, reach over head, hygiene/grooming, locomotion level, and caring for others  PARTICIPATION LIMITATIONS: meal prep, cleaning, laundry, medication management, personal finances, interpersonal relationship, driving, shopping, community activity, occupation, and yard work  PERSONAL FACTORS Education, Fitness, Past/current experiences, Time since onset of injury/illness/exacerbation, Transportation, and 1-2 comorbidities: multiple toe amputations and history of  CVA   are also affecting patient's functional  outcome.   REHAB POTENTIAL: Good  CLINICAL DECISION MAKING: Evolving/moderate complexity  EVALUATION COMPLEXITY: Moderate   GOALS: Goals reviewed with patient? Yes  SHORT TERM GOALS: Target date: 10/30/2021  Pt will be independent with initial HEP for improved strength, balance, transfers and gait.  Baseline: Goal status: INITIAL  2.  Pt will increase BERG balance score to >/=45/56 to demonstrate improved static balance. Baseline: 39/56 Goal status: INITIAL  3.  Pt will ascend/descend 12 steps using step-to pattern and L handrail w/min A and LRAD for improved independence w/household mobility  Baseline:  Goal status: INITIAL  4.  Pt will improve 5 x STS to less than or equal to 25 seconds to demonstrate improved functional strength and transfer efficiency.   Baseline: 28.32 sec (9/25) Goal status: INITIAL  5.  Pt will improve gait velocity to at least 2.75 ft/sec for improved gait efficiency and performance at mod I level   Baseline: 2.49 ft/sec (9/25) Goal status: INITIAL  6.  Pt will perform w/LRAD and S* on 30 day assessment and LTG written  Baseline: 830 ft with 2 standing rest breaks (9/25) Goal status: INITIAL  LONG TERM GOALS: Target date: 11/27/2021  Pt will be independent with final HEP for improved strength, balance, transfers and gait.  Baseline:  Goal status: INITIAL  2.  Pt will increase BERG balance score to >/=52/56 to demonstrate improved static balance. Baseline: 39/56 Goal status: INITIAL  3.  Pt will improve gait velocity to at least 3.0 ft/sec for improved gait efficiency and performance at mod I level   Baseline: 2.49 ft/sec (9/25) Goal status: INITIAL  4.  Pt will improve 5 x STS to less than or equal to 20 seconds to demonstrate improved functional strength and transfer efficiency.   Baseline: 28.32 sec (9/25) Goal status: INITIAL  5.  Pt will ambulate greater than or equal to 1000 feet on with LRAD at mod I level for  improved cardiovascular endurance and BLE strength.   Baseline: 830 ft with 2 standing rest breaks (9/25) Goal status: INITIAL  6.  Pt will ascend/descend 12 steps using step-to pattern and L handrail w/S* for improved independence at home  Baseline: 10/11/2021 4 stairs CGA-SBA step-to on descent only w/ SPC and 2 fingertips on R rail. Goal status: INITIAL   PLAN: PT FREQUENCY:  2x/week for 4 weeks and 1x/week for 4 weeks  PT DURATION: 8 weeks  PLANNED INTERVENTIONS: Therapeutic exercises, Therapeutic activity, Neuromuscular re-education, Balance training, Gait training, Patient/Family education, Self Care, Joint mobilization, Joint manipulation, Stair training, Vestibular training, Canalith repositioning, Orthotic/Fit training, DME instructions, Aquatic Therapy, Electrical stimulation, Manual therapy, and Re-evaluation.  PLAN FOR NEXT SESSION:  add to HEP for LE stretching, balance, endurance, gait training w/o cane-outside, tilt board, SciFit for endurance, foam beam/airex, obstacle course no AD, quad/glut strength-squats/lifting/leg press   Jill Alexanders Dariane Natzke, PT, DPT 10/22/2021, 4:24 PM

## 2021-10-25 ENCOUNTER — Ambulatory Visit: Payer: Medicaid Other | Admitting: Physical Therapy

## 2021-10-29 ENCOUNTER — Ambulatory Visit: Payer: Medicaid Other | Admitting: Physical Therapy

## 2021-10-29 VITALS — BP 111/73 | HR 72

## 2021-10-29 DIAGNOSIS — M6281 Muscle weakness (generalized): Secondary | ICD-10-CM

## 2021-10-29 DIAGNOSIS — R2681 Unsteadiness on feet: Secondary | ICD-10-CM

## 2021-10-29 DIAGNOSIS — R278 Other lack of coordination: Secondary | ICD-10-CM

## 2021-10-29 NOTE — Therapy (Signed)
OUTPATIENT PHYSICAL THERAPY THORACOLUMBAR TREATMENT   Patient Name: Bilaal Leib MRN: [id number] DOB:[birth year], 56 y.o., male Today's Date: 10/29/2021   PT End of Session - 10/29/21 1537     Visit Number 7    Number of Visits 13   Plus eval   Date for PT Re-Evaluation 12/04/21    Authorization Type [employer]    Authorization - Number of Visits 27    Progress Note Due on Visit 10    PT Start Time 1535   PT running late   PT Stop Time 1618    PT Time Calculation (min) 43 min    Equipment Utilized During Treatment Gait belt    Activity Tolerance Patient tolerated treatment well    Behavior During Therapy WFL for tasks assessed/performed                Past Medical History:  Diagnosis Date   COVID-19 02/06/2019   Diabetes mellitus without complication ([place])    Hypertension    PE (pulmonary thromboembolism) ([place])    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   Pneumonia    with Covid   Stroke ([place])    weakness on right side, occurred while he had Covid   Past Surgical History:  Procedure Laterality Date   AMPUTATION Right 08/13/2019   Procedure: AMPUTATION RIGHT FIRST AND SECOND TOES;  Surgeon: Rosetta Posner, MD;  Location: [facility];  Service: Vascular;  Laterality: Right;   DIRECT LARYNGOSCOPY Bilateral 04/06/2019   Procedure: MICRO DIRECT LARYNGOSCOPY WITH PROLARYN INJECTION;  Surgeon: Melida Quitter, MD;  Location: [facility];  Service: ENT;  Laterality: Bilateral;   IR GASTROSTOMY TUBE MOD SED  03/10/2019   IR GASTROSTOMY TUBE REMOVAL  04/20/2019   WOUND DEBRIDEMENT Left 08/13/2019   Procedure: AMPUTATION  OF LEFT FIRST TOE, AND DEBRIDEMENT OF LEFT SECOND, THIRD AND FOURTH TOES;  Surgeon: Rosetta Posner, MD;  Location: [facility];  Service: Vascular;  Laterality: Left;   Patient Active Problem List   Diagnosis Date Noted   Type 2 diabetes, controlled, with ulcer of toe ([place]) 09/27/2021   Anxiety 07/20/2021   COVID-19 long hauler 07/20/2021   Depression  07/20/2021   History of severe acute respiratory syndrome coronavirus 2 (SARS-CoV-2) disease 07/20/2021   Polyneuropathy associated with critical illness ([place]) 07/20/2021   Nerve pain 12/15/2020   Diplopia 10/20/2020   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 07/14/2020   Uncontrolled hypertension 02/18/2020   Impaired functional mobility, balance, gait, and endurance 08/23/2019   Amputation of great toe ([place]) 08/23/2019   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   Impaired ambulation 06/11/2019   Dependent on walker for ambulation 04/30/2019   Right spastic hemiparesis ([place]) 04/30/2019   Tachycardia 04/30/2019   Dependence on cane 04/30/2019   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   Acute blood loss anemia    Uncontrolled type 2 diabetes mellitus with hyperglycemia ([facility])    Dry gangrene (H[facility])    Leukocytosis    Embolic stroke ([address]) 82/99/3716   Status post tracheostomy ([place])    Pressure injury of skin 03/02/2019   On mechanically assisted ventilation ([facility])    Goals of care, counseling/discussion    Palliative care encounter    ARDS (adult respiratory distress syndrome) ([place])    Cerebral embolism with cerebral infarction 02/17/2019   Acute respiratory failure with hypoxia ([place])    Pneumothorax, right     PCP: Bo Merino I,  NP  REFERRING PROVIDER: Elba Barman, DO   REFERRING DIAG: M54.41,M54.42,G89.29 (ICD-10-CM) - Chronic right-sided low back pain with bilateral sciatica;  G62.81 (ICD-10-CM)- polyneuropathy associated with critical illness   Rationale for Evaluation and Treatment Rehabilitation  THERAPY DIAG:  Unsteadiness on feet  Muscle weakness (generalized)  Other lack of coordination  ONSET DATE: 09/28/2021   SUBJECTIVE:                                                                                                                                                                                           SUBJECTIVE  STATEMENT: Pt presents with interpreter-[name]. Pt reports he feels as though he is getting stronger in his legs. Having a lot of phantom pain in B feet at night. Doing his exercises every 2 hours every day. "I do not want to be a burden on my family". No new changes   PERTINENT HISTORY:  The patient underwent an extremely complicated course of [id number] about 3 years ago.  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 He has had bilateral neuropathic pain of both feet all throughout the dorsum and sometimes plantar aspect of the feet since his complicated COVID course. He did have transmet amputations of the right foot toes 1 and 2, left foot first right toe. He states he never had neuropathy until this was performed. Low back pain, bilateral foot pain and numbness/tingling.   PAIN:  Are you having pain? Yes: NPRS scale: 6/10 Pain location: medial aspect of bilateral feet and ankles Pain description: heavy Aggravating factors: pain is worse in the morning Relieving factors: medicine    PRECAUTIONS: Fall  WEIGHT BEARING RESTRICTIONS No  FALLS:  Has patient fallen in last 6 months? 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 Pt reported he feels as though he "has water across" his foot that moves when he walks and throws him off balance. Has appointment in [facility] soon to do "exams" on this.   STG Assessment   [facility] PT Assessment - 10/29/21 1552        Ambulation/Gait   Gait  velocity 32.8' over 11.53s w/SPC = 2.84 ft/s      Standardized Balance Assessment   Five times sit to stand comments  25.03s w/BUE support            Pt reported feeling out of breath following 5x STS, so assessed vitals (see above). Noted SpO2 on room air is hovering at 90-91%, so closely monitored through remainder of session.   STAIRS:  Level of Assistance: SBA  Stair Negotiation Technique: Step to Pattern Alternating Pattern  with Single Rail on Left and SPC on R  Number of Stairs: 12   Height of Stairs: 6"  Comments: Pt initially demonstrated alternating pattern but quickly regressed to step-to pattern due to fatigue and anxiety. Pt reports he practiced stairs at home and tried to perform alternating pattern and almost fell 2x, so would benefit from added practice to improved confidence at home.    Ther Ex  SciFit multi-peaks level 2 for 6 minutes using BUE/BLEs for dynamic cardiovascular conditioning, BLE strength and UE/LE coordination. RPE of9/10 following activity    GAIT: Gait pattern: step through pattern, decreased arm swing- Right, decreased arm swing- Left, lateral lean- Left, and wide BOS Distance walked: various clinic distances Assistive device utilized: Single point cane Level of assistance: SBA Comments: No instability noted this date     PATIENT EDUCATION:  Education details: Continue HEP, goal outcomes, plan to DC next session  Person educated: Patient Education method: Explanation, Demonstration, and Handouts Education comprehension: verbalized understanding and needs further education   HOME EXERCISE PROGRAM: (Provided in Spanish to patient)  Access Code: [email or web address] URL: [email or web address] Date: 10/08/2021 Prepared by: Excell Seltzer  Exercises - Supine Single Knee to Chest Stretch  - 1-2 x daily - 7 x weekly - 2 sets - 5 reps - 30-60 hold - Supine Piriformis Stretch with Leg Straight  - 1 x daily - 7 x  weekly - 2 sets - 5 reps - 30-60 sec hold - Seated Hamstring Stretch  - 1 x daily - 7 x weekly - 1 sets - 2-3 reps - 30-45 seconds hold - Sit to Stand  - 1 x daily - 7 x weekly - 2 sets - 10 reps - Standing Single Leg Stance with Counter Support  - 1 x daily - 4-5 x weekly - 1 sets - 3 reps - 30 seconds hold - Standing Tandem Balance with Counter Support  - 1 x daily - 5 x weekly - 1 sets - 3 reps - 30 seconds hold - Backward Walking with Counter Support  - 1 x daily - 5 x weekly - 3 sets - 10 reps - Walking with Counter Support  - 1 x daily - 5 x weekly - 3 sets - 10 reps   ASSESSMENT:  CLINICAL IMPRESSION: Emphasis of skilled PT session on beginning STG assessment and pt education. Pt has met 4/6 STGs so far, improving his gait speed w/SPC and requiring less time to perform 5x STS. Pt continues to demonstrate step-to pattern w/stair navigation, so will continue to progress to goal of using alternating pattern with single rail to imitate home set up. Pt inquiring about ways to desensitize foot, which therapist provided at beginning of session. Pt requesting to DC next session, therapist verbalized understanding. Continue POC.   OBJECTIVE IMPAIRMENTS Abnormal gait, cardiopulmonary status limiting activity, decreased activity tolerance, decreased balance, decreased coordination, decreased endurance, decreased knowledge of condition, decreased knowledge of use of DME, decreased mobility, difficulty walking, decreased ROM, decreased strength,  decreased safety awareness, impaired perceived functional ability, impaired sensation, impaired UE functional use, improper body mechanics, and pain.   ACTIVITY LIMITATIONS carrying, lifting, bending, squatting, stairs, transfers, bed mobility, bathing, toileting, dressing, reach over head, hygiene/grooming, locomotion level, and caring for others  PARTICIPATION LIMITATIONS: meal prep, cleaning, laundry, medication management, personal finances, interpersonal  relationship, driving, shopping, community activity, occupation, and yard work  PERSONAL FACTORS Education, Fitness, Past/current experiences, Time since onset of injury/illness/exacerbation, Transportation, and 1-2 comorbidities: multiple toe amputations and history of CVA   are also affecting patient's functional outcome.   REHAB POTENTIAL: Good  CLINICAL DECISION MAKING: Evolving/moderate complexity  EVALUATION COMPLEXITY: Moderate   GOALS: Goals reviewed with patient? Yes  SHORT TERM GOALS: Target date: 10/30/2021  Pt will be independent with initial HEP for improved strength, balance, transfers and gait.  Baseline: Goal status: MET  2.  Pt will increase BERG balance score to >/=45/56 to demonstrate improved static balance. Baseline: 39/56 Goal status: INITIAL  3.  Pt will ascend/descend 12 steps using step-to pattern and L handrail w/min A and LRAD for improved independence w/household mobility  Baseline:  Goal status: IN PROGRESS  4.  Pt will improve 5 x STS to less than or equal to 25 seconds to demonstrate improved functional strength and transfer efficiency.   Baseline: 28.32 sec (9/25); 25.03s w/BUE support  Goal status: MET  5.  Pt will improve gait velocity to at least 2.75 ft/sec for improved gait efficiency and performance at mod I level   Baseline: 2.49 ft/sec (9/25); 2.84 ft/s w/SPC Goal status: MET  6.  Pt will perform 6MWT w/LRAD and S* on 30 day assessment and LTG written  Baseline: 830 ft with 2 standing rest breaks (9/25) Goal status: MET  LONG TERM GOALS: Target date: 11/27/2021  Pt will be independent with final HEP for improved strength, balance, transfers and gait.  Baseline:  Goal status: INITIAL  2.  Pt will increase BERG balance score to >/=52/56 to demonstrate improved static balance. Baseline: 39/56 Goal status: INITIAL  3.  Pt will improve gait velocity to at least 3.0 ft/sec for improved gait efficiency and performance at mod I  level   Baseline: 2.49 ft/sec (9/25) Goal status: INITIAL  4.  Pt will improve 5 x STS to less than or equal to 20 seconds to demonstrate improved functional strength and transfer efficiency.   Baseline: 28.32 sec (9/25) Goal status: INITIAL  5.  Pt will ambulate greater than or equal to 1000 feet on 6MWT with LRAD at mod I level for improved cardiovascular endurance and BLE strength.   Baseline: 830 ft with 2 standing rest breaks (9/25) Goal status: INITIAL  6.  Pt will ascend/descend 12 steps using step-to pattern and L handrail w/S* for improved independence at home  Baseline: 10/11/2021 4 stairs CGA-SBA step-to on descent only w/ SPC and 2 fingertips on R rail. Goal status: INITIAL   PLAN: PT FREQUENCY:  2x/week for 4 weeks and 1x/week for 4 weeks  PT DURATION: 8 weeks  PLANNED INTERVENTIONS: Therapeutic exercises, Therapeutic activity, Neuromuscular re-education, Balance training, Gait training, Patient/Family education, Self Care, Joint mobilization, Joint manipulation, Stair training, Vestibular training, Canalith repositioning, Orthotic/Fit training, DME instructions, Aquatic Therapy, Electrical stimulation, Manual therapy, and Re-evaluation.  PLAN FOR NEXT SESSION:  Finish goal assessment Merrilee Jansky) and DC    Cruzita Lederer Destyne Goodreau, PT, DPT 10/29/2021, 4:20 PM

## 2021-11-01 ENCOUNTER — Ambulatory Visit: Payer: Medicaid Other | Admitting: Physical Therapy

## 2021-11-01 ENCOUNTER — Encounter: Payer: Self-pay | Admitting: Physical Therapy

## 2021-11-01 VITALS — BP 114/72 | HR 78

## 2021-11-01 DIAGNOSIS — I69351 Hemiplegia and hemiparesis following cerebral infarction affecting right dominant side: Secondary | ICD-10-CM

## 2021-11-01 DIAGNOSIS — R2681 Unsteadiness on feet: Secondary | ICD-10-CM

## 2021-11-01 DIAGNOSIS — M6281 Muscle weakness (generalized): Secondary | ICD-10-CM

## 2021-11-01 DIAGNOSIS — R278 Other lack of coordination: Secondary | ICD-10-CM

## 2021-11-01 NOTE — Therapy (Signed)
OUTPATIENT PHYSICAL THERAPY THORACOLUMBAR TREATMENT/DISCHARGE SUMMARY   Patient Name: Andrew Bruce MRN: [id number] DOB:06-14-1965, 56 y.o., male Today's Date: 11/01/2021  PHYSICAL THERAPY DISCHARGE SUMMARY  Visits from Start of Care: 8  Current functional level related to goals / functional outcomes: See clinical impression statement.   Remaining deficits: Static and dynamic balance deficits, reliance on [facility] for ambulatory tasks, generalized weakness and decreased endurance   Education / Equipment: Continue HEP, progress towards goals, obtaining new referral if desired, and discharge plan.   Patient agrees to discharge. Patient goals were partially met. Patient is being discharged due to the patient's request.    PT End of Session - 11/01/21 1611     Visit Number 8    Number of Visits 13   Plus eval   Date for PT Re-Evaluation 12/04/21    Authorization Type [employer] Medicaid    Authorization - Number of Visits 27    Progress Note Due on Visit 10    PT Start Time 1609   pt agreeable to being seen early   PT Stop Time 1640    PT Time Calculation (min) 31 min    Equipment Utilized During Treatment Gait belt    Activity Tolerance Patient tolerated treatment well    Behavior During Therapy WFL for tasks assessed/performed;Anxious                Past Medical History:  Diagnosis Date   COVID-19 02/06/2019   Diabetes mellitus without complication (HCC)    Hypertension    PE (pulmonary thromboembolism) ([place])    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   Pneumonia    with Covid   Stroke ([place])    weakness on right side, occurred while he had Covid   Past Surgical History:  Procedure Laterality Date   AMPUTATION Right 08/13/2019   Procedure: AMPUTATION RIGHT FIRST AND SECOND TOES;  Surgeon: Rosetta Posner, MD;  Location: [place];  Service: Vascular;  Laterality: Right;   DIRECT LARYNGOSCOPY Bilateral 04/06/2019   Procedure: MICRO DIRECT LARYNGOSCOPY WITH  PROLARYN INJECTION;  Surgeon: Melida Quitter, MD;  Location: [place];  Service: ENT;  Laterality: Bilateral;   IR GASTROSTOMY TUBE MOD SED  03/10/2019   IR GASTROSTOMY TUBE REMOVAL  04/20/2019   WOUND DEBRIDEMENT Left 08/13/2019   Procedure: AMPUTATION  OF LEFT FIRST TOE, AND DEBRIDEMENT OF LEFT SECOND, THIRD AND FOURTH TOES;  Surgeon: Rosetta Posner, MD;  Location: [place];  Service: Vascular;  Laterality: Left;   Patient Active Problem List   Diagnosis Date Noted   Type 2 diabetes, controlled, with ulcer of toe ([place]) 09/27/2021   Anxiety 07/20/2021   COVID-19 long hauler 07/20/2021   Depression 07/20/2021   History of severe acute respiratory syndrome coronavirus 2 (SARS-CoV-2) disease 07/20/2021   Polyneuropathy associated with critical illness ([place]) 07/20/2021   Nerve pain 12/15/2020   Diplopia 10/20/2020   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 07/14/2020   Uncontrolled hypertension 02/18/2020   Impaired functional mobility, balance, gait, and endurance 08/23/2019   Amputation of great toe ([place]) 08/23/2019   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   Impaired ambulation 06/11/2019   Dependent on walker for ambulation 04/30/2019   Right spastic hemiparesis ([place]) 04/30/2019   Tachycardia 04/30/2019   Dependence on cane 04/30/2019   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   Acute blood loss anemia    Uncontrolled type 2 diabetes mellitus with hyperglycemia ([place])  Dry gangrene (HCC)    Leukocytosis    Embolic stroke ([place]) 97/94/8016   Status post tracheostomy ([place])    Pressure injury of skin 03/02/2019   On mechanically assisted ventilation (HCC)    Goals of care, counseling/discussion    Palliative care encounter    ARDS (adult respiratory distress syndrome) ([place])    Cerebral embolism with cerebral infarction 02/17/2019   Acute respiratory failure with hypoxia (HCC)    Pneumothorax, right     PCP: Teena Dunk, NP  REFERRING  PROVIDER: Elba Barman, DO   REFERRING DIAG: M54.41,M54.42,G89.29 (ICD-10-CM) - Chronic right-sided low back pain with bilateral sciatica;  G62.81 (ICD-10-CM)- polyneuropathy associated with critical illness   Rationale for Evaluation and Treatment Rehabilitation  THERAPY DIAG:  Unsteadiness on feet  Muscle weakness (generalized)  Other lack of coordination  Hemiplegia and hemiparesis following cerebral infarction affecting right dominant side ([place])  ONSET DATE: 09/28/2021   SUBJECTIVE:                                                                                                                                                                                           SUBJECTIVE STATEMENT: Pt presents with interpreter-Sylvia.  Pt reports he would like to be done with PT today as he starts OT soon and is worried about Medicaid coverage.  PERTINENT HISTORY:  The patient underwent an extremely complicated course of [id number]-[id number] about 3 years ago.  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 He has had bilateral neuropathic pain of both feet all throughout the dorsum and sometimes plantar aspect of the feet since his complicated COVID course. He did have transmet amputations of the right foot toes 1 and 2, left foot first right toe. He states he never had neuropathy until this was performed. Low back pain, bilateral foot pain and numbness/tingling.   PAIN:  Are you having pain? Yes: NPRS scale: 6/10 Pain location: medial aspect of bilateral feet and ankles Pain description: heavy Aggravating factors: pain is worse in the morning Relieving factors: medicine    PRECAUTIONS: Fall  WEIGHT BEARING RESTRICTIONS No  FALLS:  Has patient fallen in last 6 months? 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  Pt states the stretches help a lot and that all exercises remain moderately challenging.  He still relies on hand support for all balance exercises, discussed safe progression to decrease hand support and challenge balance more and how this will translate to better quality of gait.  -Assessed BERG:    [facility] PT Assessment - 11/01/21 1622       Berg Balance Test   Sit to Stand Able to stand without using hands and stabilize independently   pt hesistant, required 2 attempts   Standing Unsupported Able to stand safely 2 minutes    Sitting with Back Unsupported but Feet Supported on Floor or Stool Able to sit safely and securely 2 minutes    Stand to Sit Sits safely with minimal use of hands    Transfers Able to transfer safely, definite need of hands    Standing Unsupported with Eyes Closed Able to stand 10 seconds safely    Standing Unsupported with Feet Together Able to place feet together independently and stand 1 minute safely    From Standing, Reach Forward with Outstretched Arm Can reach forward >5 cm safely (2")    From Standing Position, Pick up Object from Floor Able to pick up shoe, needs supervision    From Standing Position, Turn to Look Behind Over each Shoulder Looks behind from both sides and weight shifts well    Turn 360 Degrees Able to turn 360 degrees safely but slowly    Standing Unsupported, Alternately Place Feet on Step/Stool Able to stand independently and complete 8 steps >20 seconds    Standing Unsupported, One Foot in Front Able to plae foot ahead of the other independently and hold 30 seconds    Standing on One Leg Able to lift leg independently and hold > 10 seconds    Total Score 48           Pt requires several rest breaks due  to nervousness w/ challenging balance tasks w/o UE support.  SpO2 97%, HR 79bpm following.  PATIENT EDUCATION:  Education details: Continue HEP, progress towards goals, obtaining new referral if desired, and discharge plan. Person educated: Patient Education method: Explanation, Demonstration, and Handouts Education comprehension: verbalized understanding and needs further education   HOME EXERCISE PROGRAM: (Provided in Spanish to patient)  Access Code: [email or web address] URL: [email or web address] Date: 10/08/2021 Prepared by: Excell Seltzer  Exercises - Supine Single Knee to Chest Stretch  - 1-2 x daily - 7 x weekly - 2 sets - 5 reps - 30-60 hold - Supine Piriformis Stretch with Leg Straight  - 1 x daily - 7 x weekly - 2 sets - 5 reps - 30-60 sec hold - Seated Hamstring Stretch  - 1 x daily - 7 x weekly - 1 sets - 2-3 reps - 30-45 seconds hold - Sit to Stand  - 1 x daily - 7 x weekly - 2 sets - 10 reps - Standing Single Leg Stance with Counter Support  - 1 x daily - 4-5 x weekly - 1 sets - 3 reps - 30 seconds hold - Standing Tandem Balance with Counter Support  - 1 x daily - 5 x weekly - 1 sets - 3 reps - 30 seconds hold - Backward Walking with Counter Support  - 1 x daily - 5 x weekly - 3 sets - 10 reps - Walking with Counter Support  - 1 x daily - 5 x weekly - 3 sets - 10 reps   ASSESSMENT:  CLINICAL  IMPRESSION: Pt self-discharging this session due to worry about visit limit, Medicaid charges, and remaining need for OT.  Pt met 5 of 6 STGs overall with pt improving BERG score to 48/56 this session.  See note from session prior for details on remaining goals.  He remains motivated outside the clinic and HEP is up-to-date with patient's current functional level.  He continues to be challenged by anxiety related to fear of falling due to imbalance which PT has addressed in several visits.  Provided edu to return for further services if needed following period of self-management at  home.   OBJECTIVE IMPAIRMENTS Abnormal gait, cardiopulmonary status limiting activity, decreased activity tolerance, decreased balance, decreased coordination, decreased endurance, decreased knowledge of condition, decreased knowledge of use of DME, decreased mobility, difficulty walking, decreased ROM, decreased strength, decreased safety awareness, impaired perceived functional ability, impaired sensation, impaired UE functional use, improper body mechanics, and pain.   ACTIVITY LIMITATIONS carrying, lifting, bending, squatting, stairs, transfers, bed mobility, bathing, toileting, dressing, reach over head, hygiene/grooming, locomotion level, and caring for others  PARTICIPATION LIMITATIONS: meal prep, cleaning, laundry, medication management, personal finances, interpersonal relationship, driving, shopping, community activity, occupation, and yard work  PERSONAL FACTORS Education, Fitness, Past/current experiences, Time since onset of injury/illness/exacerbation, Transportation, and 1-2 comorbidities: multiple toe amputations and history of CVA   are also affecting patient's functional outcome.   REHAB POTENTIAL: Good  CLINICAL DECISION MAKING: Evolving/moderate complexity  EVALUATION COMPLEXITY: Moderate   GOALS: Goals reviewed with patient? Yes  SHORT TERM GOALS: Target date: 10/30/2021  Pt will be independent with initial HEP for improved strength, balance, transfers and gait.  Baseline: Goal status: MET  2.  Pt will increase BERG balance score to >/=45/56 to demonstrate improved static balance. Baseline: 39/56; 48/56 11/01/2021 Goal status: MET  3.  Pt will ascend/descend 12 steps using step-to pattern and L handrail w/min A and LRAD for improved independence w/household mobility  Baseline:  Goal status: IN PROGRESS  4.  Pt will improve 5 x STS to less than or equal to 25 seconds to demonstrate improved functional strength and transfer efficiency.   Baseline: 28.32 sec  (9/25); 25.03s w/BUE support  Goal status: MET  5.  Pt will improve gait velocity to at least 2.75 ft/sec for improved gait efficiency and performance at mod I level   Baseline: 2.49 ft/sec (9/25); 2.84 ft/s w/SPC Goal status: MET  6.  Pt will perform 6MWT w/LRAD and S* on 30 day assessment and LTG written  Baseline: 830 ft with 2 standing rest breaks (9/25) Goal status: MET  LONG TERM GOALS: Target date: 11/27/2021  Pt will be independent with final HEP for improved strength, balance, transfers and gait.  Baseline:  Goal status: INITIAL  2.  Pt will increase BERG balance score to >/=52/56 to demonstrate improved static balance. Baseline: 39/56 Goal status: INITIAL  3.  Pt will improve gait velocity to at least 3.0 ft/sec for improved gait efficiency and performance at mod I level   Baseline: 2.49 ft/sec (9/25) Goal status: INITIAL  4.  Pt will improve 5 x STS to less than or equal to 20 seconds to demonstrate improved functional strength and transfer efficiency.   Baseline: 28.32 sec (9/25) Goal status: INITIAL  5.  Pt will ambulate greater than or equal to 1000 feet on 6MWT with LRAD at mod I level for improved cardiovascular endurance and BLE strength.   Baseline: 830 ft with 2 standing rest breaks (9/25) Goal status: INITIAL  6.  Pt will ascend/descend 12 steps using step-to pattern and L handrail w/S* for improved independence at home  Baseline: 10/11/2021 4 stairs CGA-SBA step-to on descent only w/ SPC and 2 fingertips on R rail. Goal status: INITIAL   PLAN: PT FREQUENCY:  2x/week for 4 weeks and 1x/week for 4 weeks  PT DURATION: 8 weeks  PLANNED INTERVENTIONS: Therapeutic exercises, Therapeutic activity, Neuromuscular re-education, Balance training, Gait training, Patient/Family education, Self Care, Joint mobilization, Joint manipulation, Stair training, Vestibular training, Canalith repositioning, Orthotic/Fit training, DME instructions, Aquatic Therapy,  Electrical stimulation, Manual therapy, and Re-evaluation.  PLAN FOR NEXT SESSION:  N/A   Bary Richard, PT, DPT 11/01/2021, 4:49 PM

## 2021-11-12 DIAGNOSIS — R499 Unspecified voice and resonance disorder: Secondary | ICD-10-CM | POA: Diagnosis not present

## 2021-11-12 DIAGNOSIS — R490 Dysphonia: Secondary | ICD-10-CM | POA: Diagnosis not present

## 2021-11-30 ENCOUNTER — Encounter: Payer: Self-pay | Admitting: Physical Medicine and Rehabilitation

## 2021-11-30 ENCOUNTER — Encounter
Payer: Medicaid Other | Attending: Physical Medicine and Rehabilitation | Admitting: Physical Medicine and Rehabilitation

## 2021-11-30 VITALS — BP 123/76 | HR 72 | Ht 67.0 in | Wt 197.6 lb

## 2021-11-30 DIAGNOSIS — I63119 Cerebral infarction due to embolism of unspecified vertebral artery: Secondary | ICD-10-CM | POA: Diagnosis not present

## 2021-11-30 DIAGNOSIS — E1165 Type 2 diabetes mellitus with hyperglycemia: Secondary | ICD-10-CM | POA: Diagnosis not present

## 2021-11-30 DIAGNOSIS — S98119A Complete traumatic amputation of unspecified great toe, initial encounter: Secondary | ICD-10-CM | POA: Insufficient documentation

## 2021-11-30 DIAGNOSIS — Z9989 Dependence on other enabling machines and devices: Secondary | ICD-10-CM | POA: Diagnosis not present

## 2021-11-30 MED ORDER — CITALOPRAM HYDROBROMIDE 20 MG PO TABS: 20.0000 mg | ORAL_TABLET | Freq: Every day | ORAL | 5 refills | Status: DC

## 2021-11-30 MED ORDER — TRAMADOL HCL 50 MG PO TABS: 100.0000 mg | ORAL_TABLET | Freq: Two times a day (BID) | ORAL | 5 refills | Status: DC

## 2021-11-30 MED ORDER — LEVETIRACETAM 250 MG PO TABS: 250.0000 mg | ORAL_TABLET | Freq: Two times a day (BID) | ORAL | 5 refills | Status: DC

## 2021-11-30 MED ORDER — PREGABALIN 150 MG PO CAPS: 150.0000 mg | ORAL_CAPSULE | Freq: Three times a day (TID) | ORAL | 5 refills | Status: DC

## 2021-11-30 NOTE — Patient Instructions (Signed)
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 Original admission 01/26/19- and came to CIR 03/23/19- discharged- 04/20/19.  Has visual difficulties- double vision due to stroke.  Here for f/u on stroke as well as nerve pain and DM.    Needs  refill on tramadol- ran out- 100 mg 2x/day for nerve and joint pain.   2   A1c is 9.4- need to cut down on carbohydrates and sugars and check blood sugars. I cannot fix this- you are doing damage to your feet and causing more pain and numbness.  Is a marathon, not a short race.   3  eat a little  salt, not a lot- salt from your hand. Will make food boring!  4. Had a hard conversation about diet- if it tastes good- it's probably not good for you!  5. Chicken, not ham or sausage or beef; can also eat seafood; only pork chops or pork loin, not processed pork.  Veggies and a little fruit-   6. Celexa/Citalopram 20 mg daily- for mood  7. Will refill Lyrica 150 mg TID and Keppra 500 mg BID for nerve pain- sent to pharmacy  8. Strength is the same- will not get better since stroke anymore- we are where it will be.   9. Pt still meets criteria for disability- has talked to attorney- will give letter discussing how he's not appropriate to work.    10. F/U in 3 months for f/u on chronic pain.

## 2021-11-30 NOTE — Progress Notes (Signed)
Subjective:    Patient ID: Andrew Bruce, male    DOB: January 13, 1966, 56 y.o.   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 Original admission 01/26/19- and came to CIR 03/23/19- discharged- 04/20/19.  Has visual difficulties- double vision due to stroke.  Here for f/u on stroke as well as nerve pain and DM.   Not any better than last time here.   Restarted tramadol at last visit-  Finished them- ran out 15 days ago.  Feet feels numb and heavy- due to diabetic neuropathy , but doesn't understand this.   Feels like strength is "weak"-   Also depressed.    Pain Inventory Average Pain 9 Pain Right Now 7 My pain is burning  In the last 24 hours, has pain interfered with the following? General activity 7 Relation with others 0 Enjoyment of life 7 What TIME of day is your pain at its worst? morning  Sleep (in general) Poor  Pain is worse with: inactivity Pain improves with:  na Relief from Meds:  na  Family History  Problem Relation Age of Onset   Healthy Sister    Healthy Brother    Social History   Socioeconomic History   Marital status: Married    Spouse name: Not on file   Number of children: Not on file   Years of education: Not on file   Highest education level: Not on file  Occupational History   Not on file  Tobacco Use   Smoking status: Former   Smokeless tobacco: Never  Vaping Use   Vaping Use: Never used  Substance and Sexual Activity   Alcohol use: Yes    Comment: rare to occasional   Drug use: Never   Sexual activity: Yes    Birth control/protection: None   Other Topics Concern   Not on file  Social History Narrative   Not on file   Social Determinants of Health   Financial Resource Strain: Not on file  Food Insecurity: Not on file  Transportation Needs: Not on file  Physical Activity: Not on file  Stress: Not on file  Social Connections: Not on file   Past Surgical History:  Procedure Laterality Date   AMPUTATION Right 08/13/2019   Procedure: AMPUTATION RIGHT FIRST [place];  Surgeon: Rosetta Posner, MD;  Location: [facility];  Service: Vascular;  Laterality: Right;   DIRECT LARYNGOSCOPY Bilateral 04/06/2019   Procedure: MICRO DIRECT LARYNGOSCOPY WITH PROLARYN INJECTION;  Surgeon: Melida Quitter, MD;  Location: [place];  Service: ENT;  Laterality: Bilateral;   IR GASTROSTOMY TUBE MOD SED  03/10/2019   IR GASTROSTOMY TUBE REMOVAL  04/20/2019   WOUND DEBRIDEMENT Left 08/13/2019   Procedure: AMPUTATION  OF LEFT FIRST TOE, AND DEBRIDEMENT OF LEFT SECOND, THIRD AND FOURTH TOES;  Surgeon: Rosetta Posner, MD;  [facility];  Service: Vascular;  Laterality: Left;   Past Surgical History:  Procedure Laterality Date   AMPUTATION Right 08/13/2019   Procedure: AMPUTATION RIGHT FIRST AND SECOND TOES;  Surgeon: Rosetta Posner, MD;  Location: [place];  Service: Vascular;  Laterality: Right;   DIRECT LARYNGOSCOPY Bilateral 04/06/2019   Procedure: MICRO DIRECT LARYNGOSCOPY WITH PROLARYN INJECTION;  Surgeon: Christia Reading, MD;  Location: [facility];  Service: ENT;  Laterality: Bilateral;   IR GASTROSTOMY TUBE MOD SED  03/10/2019   IR GASTROSTOMY TUBE REMOVAL  04/20/2019   WOUND DEBRIDEMENT Left 08/13/2019   Procedure: AMPUTATION  OF LEFT FIRST TOE, AND DEBRIDEMENT OF LEFT SECOND, THIRD AND FOURTH TOES;  Surgeon: Larina Earthly, MD;  [facility];  Service: Vascular;  Laterality: Left;   Past Medical History:  Diagnosis Date   COVID-19 02/06/2019   Diabetes mellitus without complication (HCC)    Hypertension    PE (pulmonary thromboembolism) (HCC)    LLL PE  02/17/19 with cor pulmonale in setting of COVID ARDS   Pneumonia    with Covid   Stroke (HCC)    weakness on right side, occurred while he had Covid   BP 123/76   Pulse 72   Ht 5\' 7"  (1.702 m)   Wt 197 lb 9.6 oz (89.6 kg)   SpO2 95%   BMI 30.95 kg/m   Opioid Risk Score:   Fall Risk Score:  `1  Depression screen [facility] 2/9     11/30/2021    2:34 PM 09/26/2021    3:44 PM 08/13/2021    3:01 PM 06/26/2021    1:23 PM 05/01/2021    3:46 PM 04/30/2021    3:21 PM 03/14/2021    3:21 PM  Depression screen PHQ 2/9  Decreased Interest 3 0 0 0 1 1 0  Down, Depressed, Hopeless 3 0 0 0 1 1 0  PHQ - 2 Score 6 0 0 0 2 2 0  Altered sleeping  3  0 1    Tired, decreased energy  3  0 1    Change in appetite  0  0 1    Feeling bad or failure about yourself   0       Trouble concentrating  3  0 0    Moving slowly or fidgety/restless  0  0 0    Suicidal thoughts  0  0 0    PHQ-9 Score  9  0 5       Review of Systems  Constitutional: Negative.   HENT: Negative.    Eyes: Negative.   Respiratory: Negative.    Cardiovascular: Negative.   Gastrointestinal: Negative.   Endocrine: Negative.   Genitourinary: Negative.   Musculoskeletal:        Leg pain  Skin: Negative.   Allergic/Immunologic: Negative.   Neurological: Negative.   Hematological: Negative.   Psychiatric/Behavioral:  Positive for dysphoric mood.   All other systems reviewed and are negative.      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 Original admission 01/26/19- and came to CIR 03/23/19- discharged- 04/20/19.  Has visual difficulties- double vision due to stroke.  Here for f/u on stroke as well as nerve pain and DM.    Needs  refill on tramadol- ran out- 100 mg 2x/day for nerve and joint pain.   2   A1c is 9.4- need to cut down on carbohydrates and sugars and check blood sugars. I cannot fix this- you are doing damage to your feet and causing more pain and numbness.  Is a marathon, not a short race.   3  eat a little  salt, not a lot- salt from your hand. Will make food boring!  4. Had a hard conversation about diet- if it tastes good- it's probably not good for you!  5. Chicken, not ham or sausage or beef; can also eat seafood; only pork chops or pork loin, not processed pork.  Veggies and a little fruit-   6. Celexa/Citalopram 20 mg daily- for mood  7. Will refill Lyrica 150 mg TID and Keppra 500 mg BID for nerve pain- sent to pharmacy  8. Strength is the same- will not get better since stroke anymore- we are where it will be.   9. Pt still meets criteria for disability- has talked to attorney- will give letter discussing how he's not appropriate to work.    10. F/U in 3 months for f/u on chronic pain.    I spent a total of   32 minutes on total care today- >50% coordination of care- due to d/w pt about diet, DM, and disability- using interpretor

## 2021-12-13 ENCOUNTER — Ambulatory Visit: Payer: Medicaid Other | Attending: Family Medicine | Admitting: Occupational Therapy

## 2021-12-13 ENCOUNTER — Encounter: Payer: Self-pay | Admitting: Occupational Therapy

## 2021-12-13 DIAGNOSIS — M6281 Muscle weakness (generalized): Secondary | ICD-10-CM

## 2021-12-13 DIAGNOSIS — R41842 Visuospatial deficit: Secondary | ICD-10-CM | POA: Diagnosis present

## 2021-12-13 DIAGNOSIS — R278 Other lack of coordination: Secondary | ICD-10-CM

## 2021-12-13 DIAGNOSIS — R2681 Unsteadiness on feet: Secondary | ICD-10-CM

## 2021-12-13 DIAGNOSIS — R208 Other disturbances of skin sensation: Secondary | ICD-10-CM

## 2021-12-13 DIAGNOSIS — I69351 Hemiplegia and hemiparesis following cerebral infarction affecting right dominant side: Secondary | ICD-10-CM

## 2021-12-13 NOTE — Therapy (Signed)
OUTPATIENT OCCUPATIONAL THERAPY TREATMENT NOTE   Patient Name: Andrew Bruce MRN: [id number] DOB:[birth year], 56 y.o., male Today's Date: 12/13/2021  PCP: Kathrynn Speed, NP  REFERRING PROVIDER: [name], DO    END OF SESSION:   OT End of Session - 12/13/21 1627     OT Start Time 1530    OT Stop Time 1618    OT Time Calculation (min) 48 min    Activity Tolerance Patient tolerated treatment well    Behavior During Therapy [facility] for tasks assessed/performed;Anxious             Past Medical History:  Diagnosis Date   COVID-19 02/06/2019   Diabetes mellitus without complication (HCC)    Hypertension    PE (pulmonary thromboembolism) (HCC)    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   Pneumonia    with Covid   Stroke [facility])    weakness on right side, occurred while he had Covid   Past Surgical History:  Procedure Laterality Date   AMPUTATION Right 08/13/2019   Procedure: AMPUTATION RIGHT FIRST AND SECOND TOES;  Surgeon: Larina Earthly, MD;  Location: [facility] OR;  Service: Vascular;  Laterality: Right;   DIRECT LARYNGOSCOPY Bilateral 04/06/2019   Procedure: MICRO DIRECT LARYNGOSCOPY WITH PROLARYN INJECTION;  Surgeon: Christia Reading, MD;  Location: [facility] OR;  Service: ENT;  Laterality: Bilateral;   IR GASTROSTOMY TUBE MOD SED  03/10/2019   IR GASTROSTOMY TUBE REMOVAL  04/20/2019   WOUND DEBRIDEMENT Left 08/13/2019   Procedure: AMPUTATION  OF LEFT FIRST TOE, AND DEBRIDEMENT OF LEFT SECOND, THIRD AND FOURTH TOES;  Surgeon: Larina Earthly, MD;  Location: [facility] OR;  Service: Vascular;  Laterality: Left;   Patient Active Problem List   Diagnosis Date Noted   Type 2 diabetes, controlled, with ulcer of toe (HCC) 09/27/2021   Anxiety 07/20/2021   COVID-19 long hauler 07/20/2021   Depression 07/20/2021   History of severe acute respiratory syndrome coronavirus 2 (SARS-CoV-2) disease 07/20/2021   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   Nerve pain  12/15/2020   Diplopia 10/20/2020   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 07/14/2020   Uncontrolled hypertension 02/18/2020   Impaired functional mobility, balance, gait, and endurance 08/23/2019   Amputation of great toe (HCC) 08/23/2019   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   Impaired ambulation 06/11/2019   Dependent on walker for ambulation 04/30/2019   Right spastic hemiparesis (HCC) 04/30/2019   Tachycardia 04/30/2019   Dependence on cane 04/30/2019   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   Acute blood loss anemia    Uncontrolled type 2 diabetes mellitus with hyperglycemia (HCC)    Dry gangrene (HCC)    Leukocytosis    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   Status post tracheostomy (HCC)    Pressure injury of skin 03/02/2019   On mechanically assisted ventilation (HCC)    Goals of care, counseling/discussion    Palliative care encounter    ARDS (adult respiratory distress syndrome) (HCC)    Cerebral embolism with cerebral infarction 02/17/2019   Acute respiratory failure with hypoxia (HCC)    Pneumothorax, right     ONSET DATE:  09/28/2021  REFERRING DIAG: Y30.160 (ICD-10-CM) - Hemiplegia and hemiparesis following cerebral infarction affecting right dominant side (HCC) R26.81 (ICD-10-CM) - Unsteadiness on feet M62.81 (ICD-10-CM) - Muscle weakness (generalized)  THERAPY DIAG:  Hemiplegia and hemiparesis following cerebral infarction affecting right dominant side (HCC)  Other disturbances  of skin sensation  Visuospatial deficit  Unsteadiness on feet  Muscle weakness (generalized)  Other lack of coordination  Rationale for Evaluation and Treatment: Rehabilitation  PERTINENT HISTORY: The patient underwent an extremely complicated course of [id number] about 3 years ago.  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 He has  had bilateral neuropathic pain of both feet all throughout the dorsum and sometimes plantar aspect of the feet since his complicated COVID course. He did have transmet amputations of the right foot toes 1 and 2, left foot first right toe. He states he never had neuropathy until this was performed. Low back pain, bilateral foot pain and numbness/tingling.   PRECAUTIONS: Fall, hemiplegia  SUBJECTIVE: Patient reports concern regarding ongoing numbness in right side  PAIN:  Are you having pain? Yes: NPRS scale: 7/10 Pain location: shoulder right Pain description: aching Aggravating factors: movement, lifting Relieving factors: rest, tramadol   OBJECTIVE:  HAND DOMINANCE: Right   ADLs: Overall ADLs: Min assist Transfers/ambulation related to ADLs:walks with cane Eating: Uses right hand reports it takes a long time Grooming: Uses left hand for shaving, teeth, hair, wash hair UB Dressing: cannot button shirt LB Dressing: cannot tie shoes Toileting: difficulty getting up from toilet, and pulling pants over hips Bathing: Assistance for bathing and drying self Tub Shower transfers: Hydrographic surveyor chair with back Equipment: Shower seat with back and Grab bars     IADLs:   Light housekeeping: Fearful of falling does not participate Meal Prep: Does not cook - afraid of cutting or burning himself Community mobility: walks with cane Medication management: Relies on wife Financial management: Relies on wife Handwriting: 75% legible and Increased time     POSTURE COMMENTS:  Patient with limited trunk rotation, moves stiffly through trunk and limbs     ACTIVITY TOLERANCE: Activity tolerance: Patient fatigues quickly with activity   UPPER EXTREMITY ROM      Active ROM Right eval Left eval  Shoulder flexion 90 WFL Throughout  Shoulder abduction 110    Shoulder adduction      Shoulder extension      Shoulder internal rotation      Shoulder external rotation      Elbow  flexion WFL    Elbow extension WFL    Wrist flexion WFL    Wrist extension 75%    Wrist ulnar deviation      Wrist radial deviation      Wrist pronation WFL    Wrist supination 75%    (Blank rows = not tested)     HAND FUNCTION: Grip strength: Right: 20 lbs; Left: 83.1 lbs and Lateral pinch: Right: 12 lbs, Left: 18 lbs   COORDINATION: 9 Hole Peg test: Right: 67min 06 sec; Left: NA sec   SENSATION: Light touch: Impaired  Nearly absent in thumb,and index fingers.  Did not feel deep nailbed pressure       MUSCLE TONE: RUE: Mild and Hypertonic   COGNITION: Overall cognitive status: Impaired, reports decreased memory, displays decreased insight, problem solving   VISION: Subjective report: No problems before the stroke Baseline vision: No visual deficits     VISION ASSESSMENT: Impaired Ocular ROM: restricted on looking right, restricted on looking up, and restricted on looking down Gaze preference/alignment: WDL Tracking/Visual pursuits: Left eye does not track laterally, Decreased smoothness with horizontal tracking, and Requires cues, head turns, or add eye shifts to track Diplopia assessment: objects splits side to  side   Patient has difficulty with following activities due to following visual impairments: Reading, env't navigation, driving   PERCEPTION: Impaired: Spatial orientation:     PRAXIS: WFL       TODAY'S TREATMENT:  Reviewed potential areas where OT could focus to improve function.   Date:  12/13/21 This was patient's first visit following initial evaluation.  Patient with significant shoulder pain and report of numbness throughout right side (x 3 years)   Reviewed OT short and long term goals - patient in agreement.   Completed scapular mobilization passively then actively in supine.  Patient needing overt cueing and facilitation to reduce excessive tension in shoulder upper back and neck.  With active relaxation patient had immediate improvement in pain  symptoms and improved range of motion.   Worked on glenohumeral joint motion through about ~ 115* shoulder flexion.  Then worked on supine rolling toward left and right to stretch anterior chest wall.     PATIENT EDUCATION: Education details: Potential goals Person educated: Patient Education method: Explanation Education comprehension: verbalized understanding and needs further education     HOME EXERCISE PROGRAM: TBD       GOALS: Goals reviewed with patient? Yes   SHORT TERM GOALS: Target date: 12/18/21 - Delaying goals x 1 month due to inability to schedule appointments until 11/30 within his timeframe needs.     Patient will complete HEP for RUE with min cueing Baseline:  Using HEP from 2021 Goal status: ONGOING   2.  Patient will demonstrate ability to tie shoes when placed in visual field (versus on feet)  Baseline: Cannot tie shoes Goal status: ONGOING   3.  Patient will report ability to shower with supervision versus physical assistance Baseline: Min assist Goal status: ONGOING   4.  Patient will demonstrate low level reach pattern without increase in pain.   Baseline: 7-8/10 pain with RUE movement Goal status: ONGOING   5.  Patient will demonstrate awareness of compensatory strategies to help with visual deficits Baseline: Reports diplopia, and demonstrates decreased gaze stabilization Goal status: ONGOING     LONG TERM GOALS: Target date: 01/13/22   Patient will complete updated HEP designed to improve active range of motion, reduce pain, and improve functional use of RUE.   Baseline: No updated HEP Goal status: INITIAL   2.  Patient will bathe and dress himself in athletic type clothing with modified independence Baseline: min assist Goal status: INITIAL   3.  Patient will tie his shoes (on his feet) and button a shirt with supervision/ verbal cueing.   Baseline: Cannot tie or button Goal status: INITIAL   4.  Patient will demonstrate ability to read  at sentence level, compensating for visual impairments.   Baseline: Patient reports missing words and then text does not make sense.   Goal status: INITIAL   5.  Patient will demonstrate mid to high level reach (100-130*) with RUE to obtain a lightweight object (less than 2 lbs) off shelf without a significant increase in pain in R arm.   Baseline: 7-8/10 Goal status: INITIAL   6.  Patient will demonstrate 10 lb increase in grip strength in Right hand to aide with his ability to carry items Baseline: 20 lb Goal status: INITIAL   ASSESSMENT:   CLINICAL IMPRESSION: Patient reports significant persistent pain in right shoulder aggravated by lifting things.  Patient encouraged not to do exercises that lead to pain.  Patient in agreement with short and long term goals.  PERFORMANCE DEFICITS in functional skills including ADLs, IADLs, coordination, dexterity, proprioception, sensation, tone, ROM, strength, pain, fascial restrictions, flexibility, FMC, GMC, mobility, balance, body mechanics, endurance, cardiopulmonary status limiting function, vision, and UE functional use, cognitive skills including attention, emotional, learn, memory, perception, problem solving, and safety awareness, and psychosocial skills including coping strategies, habits, and routines and behaviors.    IMPAIRMENTS are limiting patient from ADLs, IADLs, rest and sleep, work, and leisure.    COMORBIDITIES may have co-morbidities  that affects occupational performance. Patient will benefit from skilled OT to address above impairments and improve overall function.        REHAB POTENTIAL: Good        PLAN: OT FREQUENCY: 2x/week   OT DURATION: 6 weeks   PLANNED INTERVENTIONS: self care/ADL training, therapeutic exercise, therapeutic activity, neuromuscular re-education, manual therapy, passive range of motion, balance training, functional mobility training, aquatic therapy, splinting, ultrasound, fluidotherapy,  moist heat, cryotherapy, contrast bath, patient/family education, cognitive remediation/compensation, visual/perceptual remediation/compensation, energy conservation, coping strategies training, and DME and/or AE instructions   RECOMMENDED OTHER SERVICES: Neuropsych, Neuro-optometry   CONSULTED AND AGREED WITH PLAN OF CARE: Patient   PLAN FOR NEXT SESSION: Assess current HEP Supine shoulder exercises (from 2021) and determine if still appropriate, needs trunk mobility and shoulder range passive to active assisted.  Needs functional use of RUE - has movement but lacks sensation so limited use.        (Copy Today's treatment to Plan section here)   Mariah Milling, OT 12/13/2021, 4:27 PM

## 2021-12-26 ENCOUNTER — Ambulatory Visit (INDEPENDENT_AMBULATORY_CARE_PROVIDER_SITE_OTHER): Payer: Medicaid Other | Admitting: Nurse Practitioner

## 2021-12-26 ENCOUNTER — Encounter: Payer: Self-pay | Admitting: Nurse Practitioner

## 2021-12-26 VITALS — BP 143/85 | HR 65 | Ht 67.0 in | Wt 196.0 lb

## 2021-12-26 DIAGNOSIS — G8929 Other chronic pain: Secondary | ICD-10-CM

## 2021-12-26 DIAGNOSIS — L97509 Non-pressure chronic ulcer of other part of unspecified foot with unspecified severity: Secondary | ICD-10-CM | POA: Diagnosis not present

## 2021-12-26 DIAGNOSIS — M79673 Pain in unspecified foot: Secondary | ICD-10-CM | POA: Diagnosis not present

## 2021-12-26 DIAGNOSIS — E7849 Other hyperlipidemia: Secondary | ICD-10-CM | POA: Diagnosis not present

## 2021-12-26 DIAGNOSIS — S98131A Complete traumatic amputation of one right lesser toe, initial encounter: Secondary | ICD-10-CM

## 2021-12-26 DIAGNOSIS — I63119 Cerebral infarction due to embolism of unspecified vertebral artery: Secondary | ICD-10-CM | POA: Diagnosis not present

## 2021-12-26 DIAGNOSIS — E11621 Type 2 diabetes mellitus with foot ulcer: Secondary | ICD-10-CM

## 2021-12-26 DIAGNOSIS — I10 Essential (primary) hypertension: Secondary | ICD-10-CM | POA: Diagnosis not present

## 2021-12-26 LAB — POCT GLYCOSYLATED HEMOGLOBIN (HGB A1C): Hemoglobin A1C: 7.2 % — AB (ref 4.0–5.6)

## 2021-12-26 MED ORDER — AMLODIPINE BESYLATE 10 MG PO TABS: 10.0000 mg | ORAL_TABLET | Freq: Every day | ORAL | 5 refills | Status: DC

## 2021-12-26 MED ORDER — GLIPIZIDE 5 MG PO TABS: 5.0000 mg | ORAL_TABLET | Freq: Every day | ORAL | 2 refills | Status: DC

## 2021-12-26 MED ORDER — ATORVASTATIN CALCIUM 20 MG PO TABS: 40.0000 mg | ORAL_TABLET | Freq: Every day | ORAL | 0 refills | Status: DC

## 2021-12-26 NOTE — Patient Instructions (Signed)
1. Type 2 diabetes, controlled, with ulcer of toe (HCC)  - glipiZIDE (GLUCOTROL) 5 MG tablet; Take 1 tablet (5 mg total) by mouth daily before breakfast.  Dispense: 30 tablet; Refill: 2 - POCT glycosylated hemoglobin (Hb A1C)  2. 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 Amputation of toe of right foot (HCC)   4. Chronic foot pain, unspecified laterality  - Ambulatory referral to Physical Medicine Rehab   Follow up:  Follow up in 3 months

## 2021-12-26 NOTE — Assessment & Plan Note (Signed)
-   glipiZIDE (GLUCOTROL) 5 MG tablet; Take 1 tablet (5 mg total) by mouth daily before breakfast.  Dispense: 30 tablet; Refill: 2 - POCT glycosylated hemoglobin (Hb A1C)  2. 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 Amputation of toe of right foot (HCC)   4. Chronic foot pain, unspecified laterality  - Ambulatory referral to Physical Medicine Rehab   Follow up:  Follow up in 3 months

## 2021-12-26 NOTE — Progress Notes (Signed)
@Patient  ID: , male    DOB: 08-17-1965, 56 y.o.   MRN: [id number]  Chief Complaint  Patient presents with   Diabetes    3 month f/u . Pt stated--still having numbness feeling and left side face/body/eye--have numbness feeling    Referring provider: [id number] I, NP   HPI  Andrew Bruce 56 y.o. male  has a past medical history of COVID-19 (02/06/2019), Diabetes mellitus without complication (HCC), Hypertension, PE (pulmonary thromboembolism) (HCC), Pneumonia, and Stroke (HCC). To the [facility] for 3 mth follow up    Diabetes:   Patient presents for diabetic follow up. Currently taking metformin and glipizide. Blood sugars at home 120-122. Complaint with diabetic diet. Taking medications. A1C is 7.2 today. Was confused about medications has only been taking metformin once daily. We discussed that he does need to take this as prescribed BID.    Hypertension:   Taking medications as directed. Checking blood pressures at home - within normal range. No issues or concerns.   Numbness to feet and left side -patient does have history of stroke and amputations to toes from COVID.  He has been seen by podiatry and orthopedics for pain.  He would like a referral for pain management.  Patient needs disability forms filled out we will reach out to [facility] to help process this.   Denies f/c/s, n/v/d, hemoptysis, PND, leg swelling Denies chest pain or edema    No Known Allergies  Immunization History  Administered Date(s) Administered   Influenza,inj,Quad PF,6+ Mos 03/25/2019, 11/17/2020   Pneumococcal Polysaccharide-23 03/25/2019, 11/17/2020    Past Medical History:  Diagnosis Date   COVID-19 02/06/2019   Diabetes mellitus without complication (HCC)    Hypertension    PE (pulmonary thromboembolism) (HCC)    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   Pneumonia    with Covid   Stroke (HCC)    weakness on right side, occurred while he had Covid     Tobacco History: Social History   Tobacco Use  Smoking Status Former  Smokeless Tobacco Never   Counseling given: Not Answered   Outpatient Encounter Medications as of 12/26/2021  Medication Sig   acetaminophen (TYLENOL) 500 MG tablet Take by mouth.   amitriptyline (ELAVIL) 25 MG tablet Take 1 tablet (25 mg total) by mouth at bedtime.   aspirin EC 81 MG tablet Take 1 tablet (81 mg total) by mouth daily. Swallow whole.   citalopram (CELEXA) 20 MG tablet Take 1 tablet (20 mg total) by mouth daily. For mood   ibuprofen (ADVIL) 800 MG tablet Take by mouth.   lisinopril (ZESTRIL) 40 MG tablet Take 1 tablet by mouth daily.   melatonin (MELATONIN MAXIMUM STRENGTH) 5 MG TABS Take by mouth.   metFORMIN (GLUCOPHAGE) 500 MG tablet Take 1 tablet (500 mg total) by mouth 2 (two) times daily with a meal.   metoprolol tartrate (LOPRESSOR) 25 MG tablet Take 1 tablet (25 mg total) by mouth 2 (two) times daily. For blood pressure   pregabalin (LYRICA) 150 MG capsule Take 1 capsule (150 mg total) by mouth 3 (three) times daily. - for nerve pain   traMADol (ULTRAM) 50 MG tablet Take 2 tablets (100 mg total) by mouth 2 (two) times daily. For chronic pain   [DISCONTINUED] amLODipine (NORVASC) 10 MG tablet Take 1 tablet (10 mg total) by mouth daily.   [DISCONTINUED] atorvastatin (LIPITOR) 20 MG tablet Take 2 tablets (40 mg total) by mouth daily at 6 PM.   [  DISCONTINUED] glipiZIDE (GLUCOTROL) 5 MG tablet Take 1 tablet (5 mg total) by mouth daily before breakfast.   amLODipine (NORVASC) 10 MG tablet Take 1 tablet (10 mg total) by mouth daily.   atorvastatin (LIPITOR) 20 MG tablet Take 2 tablets (40 mg total) by mouth daily at 6 PM.   glipiZIDE (GLUCOTROL) 5 MG tablet Take 1 tablet (5 mg total) by mouth daily before breakfast.   [DISCONTINUED] levETIRAcetam (KEPPRA) 250 MG tablet Take 1 tablet (250 mg total) by mouth 2 (two) times daily. X 1 week, then 500 mg BID- for nerve pain   No facility-administered  encounter medications on file as of 12/26/2021.     Review of Systems  Review of Systems  Constitutional: Negative.   HENT: Negative.    Cardiovascular: Negative.   Gastrointestinal: Negative.   Musculoskeletal:  Positive for arthralgias and myalgias.  Allergic/Immunologic: Negative.   Neurological: Negative.   Psychiatric/Behavioral: Negative.         Physical Exam  BP (!) 143/85   Pulse 65   Ht 5\' 7"  (1.702 m)   Wt 196 lb (88.9 kg)   SpO2 98%   BMI 30.70 kg/m   Wt Readings from Last 5 Encounters:  12/26/21 196 lb (88.9 kg)  11/30/21 197 lb 9.6 oz (89.6 kg)  09/28/21 193 lb (87.5 kg)  09/26/21 193 lb 4 oz (87.7 kg)  07/25/21 199 lb 8 oz (90.5 kg)     Physical Exam Vitals and nursing note reviewed.  Constitutional:      General: He is not in acute distress.    Appearance: He is well-developed.  Cardiovascular:     Rate and Rhythm: Normal rate and regular rhythm.  Pulmonary:     Effort: Pulmonary effort is normal.     Breath sounds: Normal breath sounds.  Skin:    General: Skin is warm and dry.  Neurological:     Mental Status: He is alert and oriented to person, place, and time.      Lab Results:  CBC    Component Value Date/Time   WBC 8.5 09/26/2021 1620   WBC 6.1 08/13/2019 0844   RBC 4.97 09/26/2021 1620   RBC 4.82 08/13/2019 0844   HGB 14.4 09/26/2021 1620   HCT 42.5 09/26/2021 1620   PLT 292 09/26/2021 1620   MCV 86 09/26/2021 1620   MCH 29.0 09/26/2021 1620   MCH 28.0 08/13/2019 0844   MCHC 33.9 09/26/2021 1620   MCHC 32.1 08/13/2019 0844   RDW 11.8 09/26/2021 1620   LYMPHSABS 2.6 03/14/2021 1627   MONOABS 0.4 04/15/2019 0620   EOSABS 0.2 03/14/2021 1627   BASOSABS 0.0 03/14/2021 1627    BMET    Component Value Date/Time   NA 140 09/26/2021 1620   K 4.9 09/26/2021 1620   CL 103 09/26/2021 1620   CO2 21 09/26/2021 1620   GLUCOSE 271 (H) 09/26/2021 1620   GLUCOSE 119 (H) 08/13/2019 0844   BUN 25 (H) 09/26/2021 1620    CREATININE 1.06 09/26/2021 1620   CALCIUM 9.4 09/26/2021 1620   GFRNONAA 104 03/06/2020 1414   GFRAA 120 03/06/2020 1414    BNP    Component Value Date/Time   BNP 65.4 03/18/2019 0427      Assessment & Plan:   Type 2 diabetes, controlled, with ulcer of toe (HCC) - glipiZIDE (GLUCOTROL) 5 MG tablet; Take 1 tablet (5 mg total) by mouth daily before breakfast.  Dispense: 30 tablet; Refill: 2 - POCT glycosylated hemoglobin (Hb A1C)  2. 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 Amputation of toe of right foot (HCC)   4. Chronic foot pain, unspecified laterality  - Ambulatory referral to Physical Medicine Rehab   Follow up:  Follow up in 3 months     [name], NP 12/26/2021

## 2021-12-27 ENCOUNTER — Ambulatory Visit: Payer: Medicaid Other | Attending: Family Medicine | Admitting: Occupational Therapy

## 2021-12-27 DIAGNOSIS — R41842 Visuospatial deficit: Secondary | ICD-10-CM | POA: Insufficient documentation

## 2021-12-27 DIAGNOSIS — M6281 Muscle weakness (generalized): Secondary | ICD-10-CM | POA: Diagnosis present

## 2021-12-27 DIAGNOSIS — R278 Other lack of coordination: Secondary | ICD-10-CM | POA: Diagnosis present

## 2021-12-27 DIAGNOSIS — I69351 Hemiplegia and hemiparesis following cerebral infarction affecting right dominant side: Secondary | ICD-10-CM | POA: Diagnosis present

## 2021-12-27 DIAGNOSIS — R208 Other disturbances of skin sensation: Secondary | ICD-10-CM | POA: Insufficient documentation

## 2021-12-27 NOTE — Therapy (Signed)
OUTPATIENT OCCUPATIONAL THERAPY TREATMENT NOTE  ** Arvil Persons from [facility], interpreting in Spanish for patient in person **   Patient Name: Andrew Bruce MRN: [id number] DOB:05-06-1965, 56 y.o., male Today's Date: 12/27/2021  PCP: Kathrynn Speed, NP  REFERRING PROVIDER: [name], DO    END OF SESSION:   OT End of Session - 12/27/21 1601     Visit Number 3    Number of Visits 13    Date for OT Re-Evaluation 01/13/22    Authorization Type [employer], 27 visit limit - no auth required - seeing PT currently    Progress Note Due on Visit 10    OT Start Time 1534    OT Stop Time 1614    OT Time Calculation (min) 40 min    Activity Tolerance Patient tolerated treatment well    Behavior During Therapy [facility] for tasks assessed/performed;Anxious              Past Medical History:  Diagnosis Date   COVID-19 02/06/2019   Diabetes mellitus without complication (HCC)    Hypertension    PE (pulmonary thromboembolism) (HCC)    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   Pneumonia    with Covid   Stroke (HCC)    weakness on right side, occurred while he had Covid   Past Surgical History:  Procedure Laterality Date   AMPUTATION Right 08/13/2019   Procedure: AMPUTATION RIGHT FIRST AND SECOND TOES;  Surgeon: Larina Earthly, MD;  Location: [facility] OR;  Service: Vascular;  Laterality: Right;   DIRECT LARYNGOSCOPY Bilateral 04/06/2019   Procedure: MICRO DIRECT LARYNGOSCOPY WITH PROLARYN INJECTION;  Surgeon: Christia Reading, MD;  Location: [facility] OR;  Service: ENT;  Laterality: Bilateral;   IR GASTROSTOMY TUBE MOD SED  03/10/2019   IR GASTROSTOMY TUBE REMOVAL  04/20/2019   WOUND DEBRIDEMENT Left 08/13/2019   Procedure: AMPUTATION  OF LEFT FIRST TOE, AND DEBRIDEMENT OF LEFT SECOND, THIRD AND FOURTH TOES;  Surgeon: Larina Earthly, MD;  Location: [facility] OR;  Service: Vascular;  Laterality: Left;   Patient Active Problem List   Diagnosis Date Noted   Type 2 diabetes, controlled, with  ulcer of toe (HCC) 09/27/2021   Anxiety 07/20/2021   COVID-19 long hauler 07/20/2021   Depression 07/20/2021   History of severe acute respiratory syndrome coronavirus 2 (SARS-CoV-2) disease 07/20/2021   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   Nerve pain 12/15/2020   Diplopia 10/20/2020   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 07/14/2020   Uncontrolled hypertension 02/18/2020   Impaired functional mobility, balance, gait, and endurance 08/23/2019   Amputation of great toe (HCC) 08/23/2019   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   Impaired ambulation 06/11/2019   Dependent on walker for ambulation 04/30/2019   Right spastic hemiparesis (HCC) 04/30/2019   Tachycardia 04/30/2019   Dependence on cane 04/30/2019   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   Acute blood loss anemia    Uncontrolled type 2 diabetes mellitus with hyperglycemia (HCC)    Dry gangrene (HCC)    Leukocytosis    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   Status post tracheostomy (HCC)    Pressure injury of skin 03/02/2019   On mechanically assisted ventilation (HCC)    Goals of care, counseling/discussion    Palliative care encounter    ARDS (adult respiratory distress syndrome) (HCC)    Cerebral embolism with cerebral infarction 02/17/2019   Acute respiratory failure  with hypoxia (HCC)    Pneumothorax, right     ONSET DATE:  09/28/2021  REFERRING DIAG: N27.782 (ICD-10-CM) - Hemiplegia and hemiparesis following cerebral infarction affecting right dominant side (HCC) R26.81 (ICD-10-CM) - Unsteadiness on feet M62.81 (ICD-10-CM) - Muscle weakness (generalized)  THERAPY DIAG:  Hemiplegia and hemiparesis following cerebral infarction affecting right dominant side (HCC)  Other disturbances of skin sensation  Visuospatial deficit  Muscle weakness (generalized)  Other lack of coordination  Rationale for Evaluation and Treatment:  Rehabilitation  PERTINENT HISTORY: The patient underwent an extremely complicated course of COVID-19 about 3 years ago.  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 He has had bilateral neuropathic pain of both feet all throughout the dorsum and sometimes plantar aspect of the feet since his complicated COVID course. He did have transmet amputations of the right foot toes 1 and 2, left foot first right toe. He states he never had neuropathy until this was performed. Low back pain, bilateral foot pain and numbness/tingling.   PRECAUTIONS: Fall, hemiplegia  SUBJECTIVE: Only has double vision with both eyes open and when moving item side to side.  PAIN:  Are you having pain? Yes: NPRS scale: 6/10 Pain location: shoulder right Pain description: aching Aggravating factors: movement, lifting Relieving factors: rest, tramadol   OBJECTIVE: (from evaluation unless otherwise noted)  HAND DOMINANCE: Right   ADLs: Overall ADLs: Min assist Transfers/ambulation related to ADLs:walks with cane Eating: Uses right hand reports it takes a long time Grooming: Uses left hand for shaving, teeth, hair, wash hair UB Dressing: cannot button shirt LB Dressing: cannot tie shoes Toileting: difficulty getting up from toilet, and pulling pants over hips Bathing: Assistance for bathing and drying self Tub Shower transfers: Pharmacist, community chair with back Equipment: Shower seat with back and Grab bars     IADLs:   Light housekeeping: Fearful of falling does not participate Meal Prep: Does not cook - afraid of cutting or burning himself Community mobility: walks with cane Medication management: Relies on wife Financial management: Relies on wife Handwriting: 75% legible and Increased time     POSTURE COMMENTS:  Patient with limited trunk rotation, moves stiffly through trunk and limbs      ACTIVITY TOLERANCE: Activity tolerance: Patient fatigues quickly with activity   UPPER EXTREMITY ROM      Active ROM Right eval Left eval  Shoulder flexion 90 WFL Throughout  Shoulder abduction 110    Shoulder adduction      Shoulder extension      Shoulder internal rotation      Shoulder external rotation      Elbow flexion WFL    Elbow extension WFL    Wrist flexion WFL    Wrist extension 75%    Wrist ulnar deviation      Wrist radial deviation      Wrist pronation WFL    Wrist supination 75%    (Blank rows = not tested)     HAND FUNCTION: Grip strength: Right: 20 lbs; Left: 83.1 lbs and Lateral pinch: Right: 12 lbs, Left: 18 lbs   COORDINATION: 9 Hole Peg test: Right: 06 sec; Left: NA sec   SENSATION: Light touch: Impaired  Nearly absent in thumb,and index fingers.  Did not feel deep nailbed pressure       MUSCLE TONE: RUE: Mild and Hypertonic   COGNITION: Overall cognitive status: Impaired, reports decreased memory, displays decreased insight, problem solving  VISION: Subjective report: No problems before the stroke Baseline vision: No visual deficits     VISION ASSESSMENT: Impaired Ocular ROM: restricted on looking right, restricted on looking up, and restricted on looking down Gaze preference/alignment: WDL Tracking/Visual pursuits: Left eye does not track laterally, Decreased smoothness with horizontal tracking, and Requires cues, head turns, or add eye shifts to track Diplopia assessment: objects splits side to side   Patient has difficulty with following activities due to following visual impairments: Reading, env't navigation, driving   PERCEPTION: Impaired: Spatial orientation:     PRAXIS: WFL      TODAY'S TREATMENT:  - Therapeutic exercises completed for duration as noted below including:  Therapist completed HEP review of RUE coordination, putty, and diplopia as noted in pt instructions. Unable to finish coordination HEP review fully  due to time and need to translate instructions to Spanish via interpreter.     PATIENT EDUCATION: Education details: RUE and diplopia HEPs Person educated: Patient Education method: Explanation, demonstrated, handout with spanish translation provided Education comprehension: verbalized understanding and needs further education     HOME EXERCISE PROGRAM: 12/14 - RUE red putty, coordination, and diplopia from previous therapy episode      GOALS: Goals reviewed with patient? Yes   SHORT TERM GOALS: Target date: 12/18/21 - Delaying goals x 1 month due to inability to schedule appointments until 11/30 within his timeframe needs.     Patient will complete HEP for RUE with min cueing Baseline:  Using HEP from 2021 Goal status: ONGOING   2.  Patient will demonstrate ability to tie shoes when placed in visual field (versus on feet)  Baseline: Cannot tie shoes Goal status: ONGOING   3.  Patient will report ability to shower with supervision versus physical assistance Baseline: Min assist Goal status: ONGOING   4.  Patient will demonstrate low level reach pattern without increase in pain.   Baseline: 7-8/10 pain with RUE movement Goal status: ONGOING   5.  Patient will demonstrate awareness of compensatory strategies to help with visual deficits Baseline: Reports diplopia, and demonstrates decreased gaze stabilization Goal status: ONGOING     LONG TERM GOALS: Target date: 01/13/22   Patient will complete updated HEP designed to improve active range of motion, reduce pain, and improve functional use of RUE.   Baseline: No updated HEP Goal status: INITIAL   2.  Patient will bathe and dress himself in athletic type clothing with modified independence Baseline: min assist Goal status: INITIAL   3.  Patient will tie his shoes (on his feet) and button a shirt with supervision/ verbal cueing.   Baseline: Cannot tie or button Goal status: INITIAL   4.  Patient will demonstrate  ability to read at sentence level, compensating for visual impairments.   Baseline: Patient reports missing words and then text does not make sense.   Goal status: INITIAL   5.  Patient will demonstrate mid to high level reach (100-130*) with RUE to obtain a lightweight object (less than 2 lbs) off shelf without a significant increase in pain in R arm.   Baseline: 7-8/10 Goal status: INITIAL   6.  Patient will demonstrate 10 lb increase in grip strength in Right hand to aide with his ability to carry items Baseline: 20 lb Goal status: INITIAL   ASSESSMENT:   CLINICAL IMPRESSION: Pt remains good candidate for skilled OT services to improve functional use of RUE and overall visual strategies as needed for more safe and independent completion of  ADLs and IADLs.   PERFORMANCE DEFICITS in functional skills including ADLs, IADLs, coordination, dexterity, proprioception, sensation, tone, ROM, strength, pain, fascial restrictions, flexibility, FMC, GMC, mobility, balance, body mechanics, endurance, cardiopulmonary status limiting function, vision, and UE functional use, cognitive skills including attention, emotional, learn, memory, perception, problem solving, and safety awareness, and psychosocial skills including coping strategies, habits, and routines and behaviors.    IMPAIRMENTS are limiting patient from ADLs, IADLs, rest and sleep, work, and leisure.    COMORBIDITIES may have co-morbidities  that affects occupational performance. Patient will benefit from skilled OT to address above impairments and improve overall function.        REHAB POTENTIAL: Good        PLAN: OT FREQUENCY: 2x/week   OT DURATION: 6 weeks   PLANNED INTERVENTIONS: self care/ADL training, therapeutic exercise, therapeutic activity, neuromuscular re-education, manual therapy, passive range of motion, balance training, functional mobility training, aquatic therapy, splinting, ultrasound, fluidotherapy, moist heat,  cryotherapy, contrast bath, patient/family education, cognitive remediation/compensation, visual/perceptual remediation/compensation, energy conservation, coping strategies training, and DME and/or AE instructions   RECOMMENDED OTHER SERVICES: Neuropsych, Neuro-optometry   CONSULTED AND AGREED WITH PLAN OF CARE: Patient   PLAN FOR NEXT SESSION: Continue with coordination HEP review; Assess current HEP Supine shoulder exercises (from 2021) and determine if still appropriate, needs trunk mobility and shoulder range passive to active assisted.  Needs functional use of RUE - has movement but lacks sensation so limited use.     Delana Meyer, OT 12/27/2021, 4:02 PM

## 2021-12-28 ENCOUNTER — Encounter: Payer: Self-pay | Admitting: Occupational Therapy

## 2021-12-31 ENCOUNTER — Telehealth: Payer: Self-pay | Admitting: Clinical

## 2021-12-31 NOTE — Telephone Encounter (Signed)
[facility] Case Management Referral Note  12/31/2021 Name: Andrew Bruce MRN: [id number] DOB: 1965/09/25 Andrew Bruce is a 56 y.o. year old male who sees [name], NP for primary care. LCSW was consulted to assess patient's needs and assist the patient with financial constraints related to low income.   Interpreter: Yes.     Interpreter Name & Language: Gean Maidens [phone], Spanish  Assessment: Patient experiencing financial constraints related to low income. He was referred by his PCP after last visit for help with disability paperwork.  Intervention: CSW called patient with interpreter. Was able to confirm in patient's chart that disability determination services (DDS) has recently requested records from [facility], suggesting that his disability case is open and pending. Patient confirmed that he is working with a Clinical research associate on this case and checks in with the lawyer. CSW advised patient that disability cases do take quite a long time, and advised that patient remain in contact with the lawyer for updates.  Patient lives with his wife and two teenage sons. His wife works part time. They have difficulty paying for rent and utilities due to low income. Referred patient to [employer] for financial assistance. Emailed [employer] information to patient at his request. Also confirming if [employer] would have language services available to pre-screen patient over the phone.  Review of patient status, including review of consultants reports, relevant laboratory and other test results, and collaboration with appropriate care team members and the patient's provider was performed as part of comprehensive patient evaluation and provision of services.    Abigail Butts, LCSW [facility] [phone]

## 2022-01-03 ENCOUNTER — Encounter: Payer: Self-pay | Admitting: Occupational Therapy

## 2022-01-03 ENCOUNTER — Ambulatory Visit: Payer: Medicaid Other | Admitting: Occupational Therapy

## 2022-01-03 DIAGNOSIS — R208 Other disturbances of skin sensation: Secondary | ICD-10-CM

## 2022-01-03 DIAGNOSIS — R41842 Visuospatial deficit: Secondary | ICD-10-CM

## 2022-01-03 DIAGNOSIS — I69351 Hemiplegia and hemiparesis following cerebral infarction affecting right dominant side: Secondary | ICD-10-CM | POA: Diagnosis not present

## 2022-01-03 DIAGNOSIS — M6281 Muscle weakness (generalized): Secondary | ICD-10-CM

## 2022-01-03 DIAGNOSIS — R278 Other lack of coordination: Secondary | ICD-10-CM

## 2022-01-03 NOTE — Therapy (Signed)
OUTPATIENT OCCUPATIONAL THERAPY TREATMENT NOTE  ** Karina from [facility], interpreting in Spanish for patient in person **   Patient Name: Andrew Bruce MRN: [id number] DOB:January 17, 1965, 56 y.o., male Today's Date: 01/03/2022  PCP: Kathrynn Speed, NP  REFERRING PROVIDER: [name], DO    END OF SESSION:   OT End of Session - 01/03/22 1534     Visit Number 4    Number of Visits 13    Date for OT Re-Evaluation 01/13/22    Authorization Type [employer] Medicaid, 27 visit limit - no auth required - seeing PT currently    Progress Note Due on Visit 10    OT Start Time 1534    OT Stop Time 1615    OT Time Calculation (min) 41 min    Activity Tolerance Patient tolerated treatment well    Behavior During Therapy [facility] for tasks assessed/performed;Anxious               Past Medical History:  Diagnosis Date   COVID-19 02/06/2019   Diabetes mellitus without complication (HCC)    Hypertension    PE (pulmonary thromboembolism) (HCC)    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   Pneumonia    with Covid   Stroke (HCC)    weakness on right side, occurred while he had Covid   Past Surgical History:  Procedure Laterality Date   AMPUTATION Right 08/13/2019   Procedure: AMPUTATION RIGHT FIRST AND SECOND TOES;  Surgeon: Larina Earthly, MD;  Location: [facility] OR;  Service: Vascular;  Laterality: Right;   DIRECT LARYNGOSCOPY Bilateral 04/06/2019   Procedure: MICRO DIRECT LARYNGOSCOPY WITH PROLARYN INJECTION;  Surgeon: Christia Reading, MD;  Location: [facility] OR;  Service: ENT;  Laterality: Bilateral;   IR GASTROSTOMY TUBE MOD SED  03/10/2019   IR GASTROSTOMY TUBE REMOVAL  04/20/2019   WOUND DEBRIDEMENT Left 08/13/2019   Procedure: AMPUTATION  OF LEFT FIRST TOE, AND DEBRIDEMENT OF LEFT SECOND, THIRD AND FOURTH TOES;  Surgeon: Larina Earthly, MD;  Location: [facility] OR;  Service: Vascular;  Laterality: Left;   Patient Active Problem List   Diagnosis Date Noted   Type 2 diabetes, controlled, with  ulcer of toe (HCC) 09/27/2021   Anxiety 07/20/2021   COVID-19 long hauler 07/20/2021   Depression 07/20/2021   History of severe acute respiratory syndrome coronavirus 2 (SARS-CoV-2) disease 07/20/2021   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   Nerve pain 12/15/2020   Diplopia 10/20/2020   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 07/14/2020   Uncontrolled hypertension 02/18/2020   Impaired functional mobility, balance, gait, and endurance 08/23/2019   Amputation of great toe (HCC) 08/23/2019   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   Impaired ambulation 06/11/2019   Dependent on walker for ambulation 04/30/2019   Right spastic hemiparesis (HCC) 04/30/2019   Tachycardia 04/30/2019   Dependence on cane 04/30/2019   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   Acute blood loss anemia    Uncontrolled type 2 diabetes mellitus with hyperglycemia (HCC)    Dry gangrene (HCC)    Leukocytosis    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   Status post tracheostomy (HCC)    Pressure injury of skin 03/02/2019   On mechanically assisted ventilation (HCC)    Goals of care, counseling/discussion    Palliative care encounter    ARDS (adult respiratory distress syndrome) (HCC)    Cerebral embolism with cerebral infarction 02/17/2019   Acute respiratory failure  with hypoxia (HCC)    Pneumothorax, right     ONSET DATE:  09/28/2021  REFERRING DIAG: Y19.509 (ICD-10-CM) - Hemiplegia and hemiparesis following cerebral infarction affecting right dominant side (HCC) R26.81 (ICD-10-CM) - Unsteadiness on feet M62.81 (ICD-10-CM) - Muscle weakness (generalized)  THERAPY DIAG:  Hemiplegia and hemiparesis following cerebral infarction affecting right dominant side (HCC)  Other disturbances of skin sensation  Visuospatial deficit  Muscle weakness (generalized)  Other lack of coordination  Rationale for Evaluation and Treatment:  Rehabilitation  PERTINENT HISTORY: The patient underwent an extremely complicated course of COVID-19 about 3 years ago.  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 He has had bilateral neuropathic pain of both feet all throughout the dorsum and sometimes plantar aspect of the feet since his complicated COVID course. He did have transmet amputations of the right foot toes 1 and 2, left foot first right toe. He states he never had neuropathy until this was performed. Low back pain, bilateral foot pain and numbness/tingling.   PRECAUTIONS: Fall, hemiplegia  SUBJECTIVE: He has had a lot of pain and cramping in his hands and his calves, which started on last Friday. He states he has been unable to get an appointment with his PCP but is planning to be seen tomorrow. He was unable to do some of the exercises because of the cramping.   PAIN:  Are you having pain? Yes: NPRS scale: 7/10 Pain location: Generalized Pain description: aching Aggravating factors: movement, lifting Relieving factors: rest, tramadol   OBJECTIVE: (from evaluation unless otherwise noted)  HAND DOMINANCE: Right   ADLs: Overall ADLs: Min assist Transfers/ambulation related to ADLs:walks with cane Eating: Uses right hand reports it takes a long time Grooming: Uses left hand for shaving, teeth, hair, wash hair UB Dressing: cannot button shirt LB Dressing: cannot tie shoes Toileting: difficulty getting up from toilet, and pulling pants over hips Bathing: Assistance for bathing and drying self Tub Shower transfers: Pharmacist, community chair with back Equipment: Shower seat with back and Grab bars     IADLs:   Light housekeeping: Fearful of falling does not participate Meal Prep: Does not cook - afraid of cutting or burning himself Community mobility: walks with cane Medication management: Relies on wife Financial  management: Relies on wife Handwriting: 75% legible and Increased time     POSTURE COMMENTS:  Patient with limited trunk rotation, moves stiffly through trunk and limbs     ACTIVITY TOLERANCE: Activity tolerance: Patient fatigues quickly with activity   UPPER EXTREMITY ROM      Active ROM Right eval Left eval  Shoulder flexion 90 WFL Throughout  Shoulder abduction 110    Shoulder adduction      Shoulder extension      Shoulder internal rotation      Shoulder external rotation      Elbow flexion WFL    Elbow extension WFL    Wrist flexion WFL    Wrist extension 75%    Wrist ulnar deviation      Wrist radial deviation      Wrist pronation WFL    Wrist supination 75%    (Blank rows = not tested)     HAND FUNCTION: Grip strength: Right: 20 lbs; Left: 83.1 lbs and Lateral pinch: Right: 12 lbs, Left: 18 lbs   COORDINATION: 9 Hole Peg test: Right: 06 sec; Left: NA sec   SENSATION: Light touch: Impaired  Nearly absent  in thumb,and index fingers.  Did not feel deep nailbed pressure       MUSCLE TONE: RUE: Mild and Hypertonic   COGNITION: Overall cognitive status: Impaired, reports decreased memory, displays decreased insight, problem solving   VISION: Subjective report: No problems before the stroke Baseline vision: No visual deficits     VISION ASSESSMENT: Impaired Ocular ROM: restricted on looking right, restricted on looking up, and restricted on looking down Gaze preference/alignment: WDL Tracking/Visual pursuits: Left eye does not track laterally, Decreased smoothness with horizontal tracking, and Requires cues, head turns, or add eye shifts to track Diplopia assessment: objects splits side to side   Patient has difficulty with following activities due to following visual impairments: Reading, env't navigation, driving   PERCEPTION: Impaired: Spatial orientation:     PRAXIS: WFL      TODAY'S TREATMENT:  - Therapeutic exercises completed for  duration as noted below including:  Therapist completed HEP review of RUE coordination, and supine ROM as noted in pt instructions. Initiated use of heat for reported muscle cramping as noted in pt instructions, which were translated to Spanish via interpreter.     PATIENT EDUCATION: Education details: Pain management, HEP, follow up with physician Person educated: Patient Education method: Explanation, demonstrated, handout with spanish translation provided Education comprehension: verbalized understanding and needs further education     HOME EXERCISE PROGRAM: 12/14 - RUE red putty, coordination, and diplopia from previous therapy episode      GOALS: Goals reviewed with patient? Yes   SHORT TERM GOALS: Target date: 12/18/21 - Delaying goals x 1 month due to inability to schedule appointments until 11/30 within his timeframe needs.     Patient will complete HEP for RUE with min cueing Baseline:  Using HEP from 2021 Goal status: ONGOING   2.  Patient will demonstrate ability to tie shoes when placed in visual field (versus on feet)  Baseline: Cannot tie shoes Goal status: ONGOING   3.  Patient will report ability to shower with supervision versus physical assistance Baseline: Min assist Goal status: ONGOING   4.  Patient will demonstrate low level reach pattern without increase in pain.   Baseline: 7-8/10 pain with RUE movement Goal status: ONGOING   5.  Patient will demonstrate awareness of compensatory strategies to help with visual deficits Baseline: Reports diplopia, and demonstrates decreased gaze stabilization Goal status: ONGOING     LONG TERM GOALS: Target date: 01/13/22   Patient will complete updated HEP designed to improve active range of motion, reduce pain, and improve functional use of RUE.   Baseline: No updated HEP Goal status: INITIAL   2.  Patient will bathe and dress himself in athletic type clothing with modified independence Baseline: min  assist Goal status: INITIAL   3.  Patient will tie his shoes (on his feet) and button a shirt with supervision/ verbal cueing.   Baseline: Cannot tie or button Goal status: INITIAL   4.  Patient will demonstrate ability to read at sentence level, compensating for visual impairments.   Baseline: Patient reports missing words and then text does not make sense.   Goal status: INITIAL   5.  Patient will demonstrate mid to high level reach (100-130*) with RUE to obtain a lightweight object (less than 2 lbs) off shelf without a significant increase in pain in R arm.   Baseline: 7-8/10 Goal status: INITIAL   6.  Patient will demonstrate 10 lb increase in grip strength in Right hand to aide with his  ability to carry items Baseline: 20 lb Goal status: INITIAL   ASSESSMENT:   CLINICAL IMPRESSION: Pt remains good candidate for skilled OT services to improve functional use of RUE and overall visual strategies as needed for more safe and independent completion of ADLs and IADLs.   PERFORMANCE DEFICITS in functional skills including ADLs, IADLs, coordination, dexterity, proprioception, sensation, tone, ROM, strength, pain, fascial restrictions, flexibility, FMC, GMC, mobility, balance, body mechanics, endurance, cardiopulmonary status limiting function, vision, and UE functional use, cognitive skills including attention, emotional, learn, memory, perception, problem solving, and safety awareness, and psychosocial skills including coping strategies, habits, and routines and behaviors.    IMPAIRMENTS are limiting patient from ADLs, IADLs, rest and sleep, work, and leisure.    COMORBIDITIES may have co-morbidities  that affects occupational performance. Patient will benefit from skilled OT to address above impairments and improve overall function.        REHAB POTENTIAL: Good        PLAN: OT FREQUENCY: 2x/week   OT DURATION: 6 weeks   PLANNED INTERVENTIONS: self care/ADL training,  therapeutic exercise, therapeutic activity, neuromuscular re-education, manual therapy, passive range of motion, balance training, functional mobility training, aquatic therapy, splinting, ultrasound, fluidotherapy, moist heat, cryotherapy, contrast bath, patient/family education, cognitive remediation/compensation, visual/perceptual remediation/compensation, energy conservation, coping strategies training, and DME and/or AE instructions   RECOMMENDED OTHER SERVICES: Neuropsych, Neuro-optometry   CONSULTED AND AGREED WITH PLAN OF CARE: Patient   PLAN FOR NEXT SESSION:  Assess current HEP Supine shoulder exercises (from 2021) and determine if still appropriate, needs trunk mobility and shoulder range passive to active assisted.  Needs functional use of RUE - has movement but lacks sensation so limited use.     Delana Meyer, OT 01/03/2022, 4:36 PM

## 2022-01-03 NOTE — Patient Instructions (Signed)
     Heat Home Program  Heat is used as part of your therapy for several reasons.  Heat increases blood flow, which promotes healing. Heat also relaxes muscles and joints which makes exercising easier.  It can be applied for __10-15_ minutes,  _up to 3_ sessions per day  Use one of the following methods that is most convenient for you  Heating pad: Follow instructions given by manufacturer.  Use on low-medium setting.    Moist heated towel: Soak towel in hot water or place damp towel in microwave and heat for 30 sec. Check temperature to determine if further time needed.  Wring out towel and wrap around affected area, cover with a plastic bag.  Can also wrap a dry towel around the plastic to help retain the heat.    Microwave gel pack: Follow instructions given by manufacturer.  Check temperature before application to ensure not hot enough to cause a burn    Rice sock: This can be made using a sock with no synthetic material and 1.5 cups of rice.  Pour the rice into the sock, tie a knot at the top.  Place in microwave for 1 minute, remove to check temperature to determine if further heating time is required prior to application  Be sure to check the skin periodically to ensure no excessive redness or blisters.  Use with caution in areas with decreased sensation 

## 2022-01-17 ENCOUNTER — Ambulatory Visit: Payer: Self-pay | Admitting: Occupational Therapy

## 2022-01-17 ENCOUNTER — Telehealth: Payer: Self-pay | Admitting: Occupational Therapy

## 2022-01-17 NOTE — Telephone Encounter (Signed)
Patient arrived today for appt and Medicaid is showing inactive he produced his [place] Medicaid card, it is showing inactive per RTE system. Also tried running through traditional NCMD also came back inactive Patient is going to contact Social Services to see about coverage for this year.  Will update our office once he has more details. Advised Therapist today of situation.

## 2022-01-24 ENCOUNTER — Ambulatory Visit: Payer: Self-pay | Admitting: Occupational Therapy

## 2022-01-25 ENCOUNTER — Telehealth: Payer: Self-pay | Admitting: Clinical

## 2022-01-25 NOTE — Telephone Encounter (Signed)
[facility] General Follow Up Note  01/25/2022 Name: Andrew Bruce MRN: [id number] DOB: [birth year] Andrew Bruce is a 57 y.o. year old male who sees Fenton Foy, NP for primary care. LCSW was initially consulted to assist the patient with financial constraints related to low income.    Interpreter: Yes.     Interpreter Name & Language: Everitt Amber #[id number]  Assessment: Patient experiencing financial constraints related to low income.   Ongoing Intervention: Today CSW called patient to follow up. Was able to confirm that [employer] has Spanish staff that can communicate with patient for pre-screening. Patient agreed to referral to [employer] for financial assistance. CSW sent referral.  Review of patient status, including review of consultants reports, relevant laboratory and other test results, and collaboration with appropriate care team members and the patient's provider was performed as part of comprehensive patient evaluation and provision of services.    Estanislado Emms, [facility] [phone]

## 2022-01-31 ENCOUNTER — Encounter: Payer: Medicaid Other | Admitting: Occupational Therapy

## 2022-02-07 ENCOUNTER — Encounter: Payer: Medicaid Other | Admitting: Occupational Therapy

## 2022-03-08 ENCOUNTER — Encounter: Payer: Self-pay | Admitting: Physical Medicine and Rehabilitation

## 2022-03-27 ENCOUNTER — Ambulatory Visit (INDEPENDENT_AMBULATORY_CARE_PROVIDER_SITE_OTHER): Payer: Self-pay | Admitting: Nurse Practitioner

## 2022-03-27 ENCOUNTER — Encounter: Payer: Self-pay | Admitting: Nurse Practitioner

## 2022-03-27 VITALS — BP 127/81 | HR 76 | Temp 98.2°F | Wt 198.8 lb

## 2022-03-27 DIAGNOSIS — L97509 Non-pressure chronic ulcer of other part of unspecified foot with unspecified severity: Secondary | ICD-10-CM

## 2022-03-27 DIAGNOSIS — E11621 Type 2 diabetes mellitus with foot ulcer: Secondary | ICD-10-CM

## 2022-03-27 DIAGNOSIS — F32A Depression, unspecified: Secondary | ICD-10-CM

## 2022-03-27 DIAGNOSIS — Z1211 Encounter for screening for malignant neoplasm of colon: Secondary | ICD-10-CM

## 2022-03-27 LAB — POCT GLYCOSYLATED HEMOGLOBIN (HGB A1C): Hemoglobin A1C: 8.6 % — AB (ref 4.0–5.6)

## 2022-03-27 NOTE — Patient Instructions (Signed)
1. Type 2 diabetes, controlled, with ulcer of toe (HCC)  - POCT glycosylated hemoglobin (Hb A1C) - AMB Referral to Pharmacy Medication Management  Lab Results  Component Value Date   HGBA1C 8.6 (A) 03/27/2022     2. Colon cancer screening  - Cologuard  3. Depression, unspecified depression type  - Ambulatory referral to Psychiatry  Follow up:  Follow up in 3 months

## 2022-03-27 NOTE — Progress Notes (Unsigned)
$'@Patient'i$  ID: Andrew Bruce, male    DOB: March 28, 1965, 57 y.o.   MRN: [id number]  Chief Complaint  Patient presents with   Diabetes    Follow up    Referring provider: Bo Merino I, NP   HPI  Andrew Bruce 57 y.o. male  has a past medical history of COVID-19 (02/06/2019), Diabetes mellitus without complication ([place]), Hypertension, PE (pulmonary thromboembolism) ([place]), Pneumonia, and Stroke ([place]). To the [facility] for 3 mth follow up    Diabetes:   Patient presents for diabetic follow up. Currently taking metformin and glipizide. Complaint with diabetic diet. Taking medications. A1C has increased to 8.6 today. Will place a referral for diabetic medication management with pharmacist.    Hypertension:   Taking medications as directed. Checking blood pressures at home - within normal range. No issues or concerns.  Has been feeling more depressed lately. We will place a referral to psychiatry.    Denies f/c/s, n/v/d, hemoptysis, PND, leg swelling Denies chest pain or edema       No Known Allergies  Immunization History  Administered Date(s) Administered   Influenza,inj,Quad PF,6+ Mos 03/25/2019, 11/17/2020   Pneumococcal Polysaccharide-23 03/25/2019, 11/17/2020    Past Medical History:  Diagnosis Date   COVID-19 02/06/2019   Diabetes mellitus without complication (HCC)    Hypertension    PE (pulmonary thromboembolism) ([place])    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   Pneumonia    with Covid   Stroke ([place])    weakness on right side, occurred while he had Covid    Tobacco History: Social History   Tobacco Use  Smoking Status Former  Smokeless Tobacco Never   Counseling given: Not Answered   Outpatient Encounter Medications as of 03/27/2022  Medication Sig   acetaminophen (TYLENOL) 500 MG tablet Take by mouth.   amitriptyline (ELAVIL) 25 MG tablet Take 1 tablet (25 mg total) by mouth at bedtime.   amLODipine (NORVASC) 10 MG tablet Take 1  tablet (10 mg total) by mouth daily.   aspirin EC 81 MG tablet Take 1 tablet (81 mg total) by mouth daily. Swallow whole.   atorvastatin (LIPITOR) 20 MG tablet Take 2 tablets (40 mg total) by mouth daily at 6 PM.   citalopram (CELEXA) 20 MG tablet Take 1 tablet (20 mg total) by mouth daily. For mood   glipiZIDE (GLUCOTROL) 5 MG tablet Take 1 tablet (5 mg total) by mouth daily before breakfast.   ibuprofen (ADVIL) 800 MG tablet Take by mouth.   lisinopril (ZESTRIL) 40 MG tablet Take 1 tablet by mouth daily.   melatonin (MELATONIN MAXIMUM STRENGTH) 5 MG TABS Take by mouth.   metFORMIN (GLUCOPHAGE) 500 MG tablet Take 1 tablet (500 mg total) by mouth 2 (two) times daily with a meal.   metoprolol tartrate (LOPRESSOR) 25 MG tablet Take 1 tablet (25 mg total) by mouth 2 (two) times daily. For blood pressure   pregabalin (LYRICA) 150 MG capsule Take 1 capsule (150 mg total) by mouth 3 (three) times daily. - for nerve pain   traMADol (ULTRAM) 50 MG tablet Take 2 tablets (100 mg total) by mouth 2 (two) times daily. For chronic pain   No facility-administered encounter medications on file as of 03/27/2022.     Review of Systems  Review of Systems  Constitutional: Negative.   HENT: Negative.    Cardiovascular: Negative.   Gastrointestinal: Negative.   Allergic/Immunologic: Negative.   Neurological: Negative.   Psychiatric/Behavioral: Negative.  Physical Exam  BP 127/81   Pulse 76   Temp 98.2 F (36.8 C)   Wt 198 lb 12.8 oz (90.2 kg)   SpO2 98%   BMI 31.14 kg/m   Wt Readings from Last 5 Encounters:  03/27/22 198 lb 12.8 oz (90.2 kg)  12/26/21 196 lb (88.9 kg)  11/30/21 197 lb 9.6 oz (89.6 kg)  09/28/21 193 lb (87.5 kg)  09/26/21 193 lb 4 oz (87.7 kg)     Physical Exam Vitals and nursing note reviewed.  Constitutional:      General: He is not in acute distress.    Appearance: He is well-developed.  Cardiovascular:     Rate and Rhythm: Normal rate and regular rhythm.   Pulmonary:     Effort: Pulmonary effort is normal.     Breath sounds: Normal breath sounds.  Skin:    General: Skin is warm and dry.  Neurological:     Mental Status: He is alert and oriented to person, place, and time.      Lab Results:  CBC    Component Value Date/Time   WBC 8.5 09/26/2021 1620   WBC 6.1 08/13/2019 0844   RBC 4.97 09/26/2021 1620   RBC 4.82 08/13/2019 0844   HGB 14.4 09/26/2021 1620   HCT 42.5 09/26/2021 1620   PLT 292 09/26/2021 1620   MCV 86 09/26/2021 1620   MCH 29.0 09/26/2021 1620   MCH 28.0 08/13/2019 0844   MCHC 33.9 09/26/2021 1620   MCHC 32.1 08/13/2019 0844   RDW 11.8 09/26/2021 1620   LYMPHSABS 2.6 03/14/2021 1627   MONOABS 0.4 04/15/2019 0620   EOSABS 0.2 03/14/2021 1627   BASOSABS 0.0 03/14/2021 1627    BMET    Component Value Date/Time   NA 140 09/26/2021 1620   K 4.9 09/26/2021 1620   CL 103 09/26/2021 1620   CO2 21 09/26/2021 1620   GLUCOSE 271 (H) 09/26/2021 1620   GLUCOSE 119 (H) 08/13/2019 0844   BUN 25 (H) 09/26/2021 1620   CREATININE 1.06 09/26/2021 1620   CALCIUM 9.4 09/26/2021 1620   GFRNONAA 104 03/06/2020 1414   GFRAA 120 03/06/2020 1414    BNP    Component Value Date/Time   BNP 65.4 03/18/2019 0427      Assessment & Plan:   Type 2 diabetes, controlled, with ulcer of toe (HCC) - POCT glycosylated hemoglobin (Hb A1C) - AMB Referral to Pharmacy Medication Management  Lab Results  Component Value Date   HGBA1C 8.6 (A) 03/27/2022     2. Colon cancer screening  - Cologuard  3. Depression, unspecified depression type  - Ambulatory referral to Psychiatry  Follow up:  Follow up in 3 months     Fenton Foy, NP 03/28/2022

## 2022-03-28 ENCOUNTER — Encounter: Payer: Self-pay | Admitting: Nurse Practitioner

## 2022-03-28 NOTE — Assessment & Plan Note (Signed)
-   POCT glycosylated hemoglobin (Hb A1C) - AMB Referral to Pharmacy Medication Management  Lab Results  Component Value Date   HGBA1C 8.6 (A) 03/27/2022     2. Colon cancer screening  - Cologuard  3. Depression, unspecified depression type  - Ambulatory referral to Psychiatry  Follow up:  Follow up in 3 months

## 2022-04-01 ENCOUNTER — Telehealth: Payer: Self-pay

## 2022-04-01 NOTE — Progress Notes (Signed)
   Care Guide Note  04/01/2022 Name: Mukesh Wunschel MRN: [id number] DOB: 01/11/1966  Referred by: Fenton Foy, NP Reason for referral : Care Coordination (Outreach to schedule with pharm d )   Carlo Hopkins is a 57 y.o. year old male who is a primary care patient of Fenton Foy, NP. Namish Fendt was referred to the pharmacist for assistance related to DM.    Successful contact was made with the patient to discuss pharmacy services including being ready for the pharmacist to call at least 5 minutes before the scheduled appointment time, to have medication bottles and any blood sugar or blood pressure readings ready for review. The patient agreed to meet with the pharmacist via with the pharmacist via telephone visit on (date/time).  04/08/2022  Noreene Larsson, [place], [place] [address] Direct Dial: [phone] [email or web address]

## 2022-04-08 ENCOUNTER — Other Ambulatory Visit: Payer: Self-pay

## 2022-04-08 ENCOUNTER — Telehealth: Payer: Self-pay

## 2022-04-08 DIAGNOSIS — R490 Dysphonia: Secondary | ICD-10-CM | POA: Diagnosis not present

## 2022-04-08 NOTE — Progress Notes (Signed)
   04/08/2022  Patient ID: Andrew Bruce, male   DOB: 1965/03/30, 57 y.o.   MRN: [id number]  Outreach attempt with Spanish interpreter on the line to reach Andrew Bruce for our scheduled telephone visit.  His number went directly to a message stating he was not able to be reached at the time with no option to leave a voicemail.  I then called his daughter, Andrew Bruce, and left a voicemail with my name and number for her to return my call.  She called just a few minutes later and was able to verify we had the correct phone number for Andrew Bruce.  She is trying to reach him to assist [place] in rescheduling his telephone visit.  Darlina Guys, PharmD, DPLA

## 2022-04-09 ENCOUNTER — Other Ambulatory Visit: Payer: Self-pay

## 2022-04-09 DIAGNOSIS — E11621 Type 2 diabetes mellitus with foot ulcer: Secondary | ICD-10-CM

## 2022-04-09 DIAGNOSIS — I10 Essential (primary) hypertension: Secondary | ICD-10-CM

## 2022-04-09 NOTE — Progress Notes (Unsigned)
   04/09/2022  Patient ID: Andrew Bruce, male   DOB: 05/30/1965, 57 y.o.   MRN: [id number]  Subjective/Objective: Telephone visit with patient and interpreter, Beverlee Nims, through [employer], to discuss medication management.  Referral was placed for pharmacy services by patient's PCP, Lazaro Arms.  Medication Management -Patient states Medicaid coverage was recently terminated, but he has reapplied and applied for disability but unaware of determination at this time.  He has a 3pm appointment at social services, where he expects to learn more. -He is completely out of his lisinopril, almost out of glipizide and amlodipine, and has 10 days left of metformin -Discussed accessibility of our [facility] outpatient pharmacy location and copay affordability if they are able to fill these generics for $4/each.  Patient endorses being able to pick medications up there and afford.  Assessment/Plan: -Pending prescriptions for metformin, lisinopril, glipizide, and amlodipine to go to [place] at [facility] for Lazaro Arms, NP to sign if in agreement -Once sent, I will verify cost with pharmacy and contact patient to provide this and expected ready day/time -Sending pharmacy address and phone number via MyChart per patient request  Follow-Up:  Will be needed once Medicaid determination is known and before patient runs out of other medications.  Evaluate eligibility for [facility] if Medicaid denied.  Will schedule 1 week follow-up when contacting him about refills being sent.  Darlina Guys, PharmD, DPLA

## 2022-04-11 ENCOUNTER — Other Ambulatory Visit: Payer: Self-pay

## 2022-04-11 ENCOUNTER — Telehealth: Payer: Self-pay

## 2022-04-11 MED ORDER — METFORMIN HCL 500 MG PO TABS
500.0000 mg | ORAL_TABLET | Freq: Two times a day (BID) | ORAL | 2 refills | Status: DC
  Filled 2022-04-11: qty 180, 90d supply, fill #0

## 2022-04-11 MED ORDER — LISINOPRIL 40 MG PO TABS
40.0000 mg | ORAL_TABLET | Freq: Every day | ORAL | 2 refills | Status: DC
  Filled 2022-04-11: qty 30, 30d supply, fill #0
  Filled 2022-05-06: qty 30, 30d supply, fill #1

## 2022-04-11 MED ORDER — AMLODIPINE BESYLATE 10 MG PO TABS
10.0000 mg | ORAL_TABLET | Freq: Every day | ORAL | 2 refills | Status: DC
  Filled 2022-04-11: qty 30, 30d supply, fill #0
  Filled 2022-05-06: qty 30, 30d supply, fill #1

## 2022-04-11 MED ORDER — GLIPIZIDE 5 MG PO TABS
5.0000 mg | ORAL_TABLET | Freq: Every day | ORAL | 2 refills | Status: DC
  Filled 2022-04-11: qty 30, 30d supply, fill #0
  Filled 2022-05-06: qty 30, 30d supply, fill #1

## 2022-04-11 NOTE — Progress Notes (Signed)
   04/11/2022  Patient ID: Andrew Bruce, male   DOB: [birth year], 57 y.o.   MRN: [id number]  [place] to verify prescriptions sent were ready and get cost information for patient.  Contact made with patient -along with Spanish interpreter through [employer]- to inform him that the lisinopril, glipizide, amlodipine, and metformin prescriptions were ready at the [facility] location.  I notified him the would need to fill out [facility] paperwork to receive some of the medications at no cost.  He voiced understanding and agreement.  Let him know that the total for all 4 medications would be $12.  He plans to get a ride this afternoon to pick these up, and states he will now have all maintenance medications for at least the next two weeks.  At [employer] appointment this week, he was able to verify Medicaid application is in process.  Follow-up call  scheduled with patient for 4/8 to see if he knows Medicaid determination and go from there to assist with access to medications.  Darlina Guys, PharmD, DPLA

## 2022-04-11 NOTE — Progress Notes (Signed)
Discussed with PCP, orders placed for co-sign.   Catie Hedwig Morton, PharmD, [place], [facility] [phone]

## 2022-04-15 ENCOUNTER — Ambulatory Visit: Payer: Self-pay | Admitting: Physical Medicine and Rehabilitation

## 2022-04-22 ENCOUNTER — Other Ambulatory Visit: Payer: Self-pay

## 2022-04-22 NOTE — Progress Notes (Signed)
   04/22/2022  Patient ID: Andrew Bruce, male   DOB: 1965/02/08, 57 y.o.   MRN: [id number]  Subjective/Objective: Telephone visit to check on status of Medicaid and current medication supply  HTN -Patient endorses checking home blood pressure regularly, with recent BP of 122/79  DM -Patient endorses checking home blood sugar regularly, with recent FBG of 129  Medication Management -Medicaid application is still in process -Patient endorses having >2 weeks of all medications left at this time  Assessment/Plan: HTN -Recommend to continue current regimen and daily monitoring and recording of home BP  DM -Recommend to continue current regimen and daily monitoring and recording of FBG  Medication Management -Telephone visit scheduled in two weeks to follow-up on Medicaid application status and amount of medications patient has left -Can continue to refill maintenance medications at our [facility] pharmacy location if there is still no determination -We could also look into eligibility for [facility]; requirements include: residency in , Income <300% poverty level  Lenna Gilford, PharmD, DPLA

## 2022-04-29 ENCOUNTER — Other Ambulatory Visit: Payer: Self-pay | Admitting: Pharmacist

## 2022-05-06 ENCOUNTER — Other Ambulatory Visit: Payer: Self-pay

## 2022-05-06 DIAGNOSIS — E11621 Type 2 diabetes mellitus with foot ulcer: Secondary | ICD-10-CM

## 2022-05-06 DIAGNOSIS — E7849 Other hyperlipidemia: Secondary | ICD-10-CM

## 2022-05-06 MED ORDER — METOPROLOL TARTRATE 25 MG PO TABS
25.0000 mg | ORAL_TABLET | Freq: Two times a day (BID) | ORAL | 3 refills | Status: DC
  Filled 2022-05-06: qty 180, 90d supply, fill #0
  Filled 2022-10-09: qty 60, 30d supply, fill #0
  Filled 2022-11-04: qty 60, 30d supply, fill #1
  Filled 2022-11-27: qty 60, 30d supply, fill #2
  Filled 2022-12-26: qty 60, 30d supply, fill #3
  Filled 2023-01-17 – 2023-02-13 (×6): qty 60, 30d supply, fill #4
  Filled 2023-03-13: qty 60, 30d supply, fill #5
  Filled 2023-04-16 (×2): qty 60, 30d supply, fill #6
  Filled ????-??-?? (×2): fill #4
  Filled ????-??-??: fill #3

## 2022-05-06 MED ORDER — ATORVASTATIN CALCIUM 40 MG PO TABS
40.0000 mg | ORAL_TABLET | Freq: Every day | ORAL | 3 refills | Status: DC
  Filled 2022-05-06: qty 90, 90d supply, fill #0

## 2022-05-06 NOTE — Progress Notes (Signed)
   05/06/2022  Patient ID: [name], male   DOB: 1965/08/13, 57 y.o.   MRN: [id number]  Subjective/Objective: Telephone follow-up with [employer] to aid in Spanish translation.  Medication Management -Patient still has no update on medicaid application -1 week of lisinopril, amlodipine, atorvastatin, and glipizide on hand -States he also needs metoprolol refilled -Has plenty of metformin on hand -States he was able to pay some on prescriptions picked up at [facility] last visit and place remaining amount on charge account, and would like to do this again if possible  Assessment/Plan:  Medication Management -Patient has contact number for representative helping with application and will follow-up on status -Refills remain on lisinopril, amlodipine, glipizide- will request [facility] fill these -Pending orders for atorvastatin and metoprolol refills to go to [facility] for PCP to sign if in agreement -Patient aware if/when approved for Medicaid, prescription will have $4 copay; but he can still utilize charge account as long as payments are being made  Follow-up:  None needed at this time, but patient has my direct number if needs arise  Lenna Gilford, PharmD, DPLA

## 2022-05-10 ENCOUNTER — Encounter: Payer: Self-pay | Admitting: Physical Medicine and Rehabilitation

## 2022-05-10 ENCOUNTER — Encounter: Payer: Self-pay | Attending: Physical Medicine and Rehabilitation | Admitting: Physical Medicine and Rehabilitation

## 2022-05-10 ENCOUNTER — Other Ambulatory Visit: Payer: Self-pay

## 2022-05-10 VITALS — BP 133/81 | HR 69 | Ht 67.0 in | Wt 197.0 lb

## 2022-05-10 DIAGNOSIS — G8111 Spastic hemiplegia affecting right dominant side: Secondary | ICD-10-CM | POA: Insufficient documentation

## 2022-05-10 DIAGNOSIS — J3801 Paralysis of vocal cords and larynx, unilateral: Secondary | ICD-10-CM | POA: Insufficient documentation

## 2022-05-10 DIAGNOSIS — Z5181 Encounter for therapeutic drug level monitoring: Secondary | ICD-10-CM | POA: Insufficient documentation

## 2022-05-10 DIAGNOSIS — Z79891 Long term (current) use of opiate analgesic: Secondary | ICD-10-CM | POA: Diagnosis not present

## 2022-05-10 DIAGNOSIS — F329 Major depressive disorder, single episode, unspecified: Secondary | ICD-10-CM | POA: Insufficient documentation

## 2022-05-10 DIAGNOSIS — I63119 Cerebral infarction due to embolism of unspecified vertebral artery: Secondary | ICD-10-CM | POA: Insufficient documentation

## 2022-05-10 DIAGNOSIS — U099 Post covid-19 condition, unspecified: Secondary | ICD-10-CM | POA: Insufficient documentation

## 2022-05-10 DIAGNOSIS — G894 Chronic pain syndrome: Secondary | ICD-10-CM | POA: Insufficient documentation

## 2022-05-10 MED ORDER — TRAMADOL HCL 50 MG PO TABS
100.0000 mg | ORAL_TABLET | Freq: Two times a day (BID) | ORAL | 5 refills | Status: DC
  Filled 2022-05-10: qty 120, 30d supply, fill #0

## 2022-05-10 MED ORDER — PREGABALIN 150 MG PO CAPS
150.0000 mg | ORAL_CAPSULE | Freq: Three times a day (TID) | ORAL | 5 refills | Status: DC
  Filled 2022-05-10: qty 90, 30d supply, fill #0

## 2022-05-10 MED ORDER — CITALOPRAM HYDROBROMIDE 20 MG PO TABS
20.0000 mg | ORAL_TABLET | Freq: Every day | ORAL | 1 refills | Status: DC
  Filled 2022-05-10: qty 30, 30d supply, fill #0
  Filled 2022-08-09: qty 90, 90d supply, fill #0
  Filled 2023-02-12: qty 90, 90d supply, fill #1
  Filled 2023-03-13: qty 30, 30d supply, fill #1

## 2022-05-10 NOTE — Progress Notes (Signed)
Subjective:    Patient ID: Andrew Bruce, male    DOB: 1965/02/28, 57 y.o.   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 Original admission 01/26/19- and came to CIR 03/23/19- discharged- 04/20/19.  Has visual difficulties- double vision due to stroke.  Here for f/u on stroke as well as nerve pain and DM.   Been the same with the pain in the legs.   Bene using the cane to get around it's the same.   BG's - said is around 120s- usually- however, A1c 8.6- 1 month ago.   Takes meds as prescribed.   Usually drinks water- every once in awhile, has a coke.    Get  anxiety about what he eats.     BP 130s/80s- doing much better   Got disability- just now-  Got back pay- in May will start getting paid monthly.   Also has Medicare- and took off medicaid.   Depression still there, but getting better.   Still has pain in legs- really painful- and feeling pain in shins/calves and lower thighs.   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   1/2 of body always numb- from stroke.    Pain Inventory Average Pain 9 Pain Right Now 9 My pain is constant, sharp, burning, and tingling  LOCATION OF PAIN  Lt leg  BOWEL Number of stools per week: 2 Oral laxative use No  Type of laxative n/a Enema or suppository use No  History of colostomy No  Incontinent No   BLADDER Normal In and out cath, frequency n/a Able to self cath  n/a Bladder incontinence No  Frequent urination Yes  Leakage with coughing No  Difficulty starting stream No  Incomplete bladder emptying No     Mobility use a cane how many minutes can you walk? 20 mins ability to climb steps?  yes do you drive?  no  Function not employed: date last employed 2021 I need assistance with the following:  meal prep, household duties, and shopping  Neuro/Psych weakness numbness tingling confusion depression anxiety loss of taste or smell  Prior Studies none  Physicians involved in your care Follow up   Family History  Problem Relation Age of Onset   Healthy Sister    Healthy Brother    Social History   Socioeconomic History   Marital status: Married    Spouse name: Not on file   Number of children: Not on file   Years of education: Not on file   Highest education level: Not on file  Occupational History   Not on file  Tobacco Use   Smoking status: Former   Smokeless tobacco: Never  Vaping Use   Vaping Use: Never used  Substance and Sexual Activity   Alcohol use: Yes    Comment: rare to occasional   Drug use: Never   Sexual activity: Yes    Birth control/protection: None  Other Topics Concern   Not on file  Social History Narrative   Not on file   Social Determinants of Health   Financial  Resource Strain: Not on file  Food Insecurity: Not on file  Transportation Needs: Not on file  Physical Activity: Not on file  Stress: Not on file  Social Connections: Not on file   Past Surgical History:  Procedure Laterality Date   AMPUTATION Right 08/13/2019   Procedure: AMPUTATION RIGHT FIRST AND SECOND TOES;  Surgeon: Larina Earthly, MD;  Location: [facility] OR;  Service: Vascular;  Laterality: Right;   DIRECT LARYNGOSCOPY Bilateral 04/06/2019   Procedure: MICRO DIRECT LARYNGOSCOPY WITH PROLARYN INJECTION;  Surgeon: Christia Reading, MD;  Location: [facility] OR;  Service: ENT;  Laterality: Bilateral;   IR GASTROSTOMY TUBE MOD SED  03/10/2019   IR GASTROSTOMY TUBE REMOVAL  04/20/2019   WOUND DEBRIDEMENT Left 08/13/2019   Procedure: AMPUTATION  OF LEFT FIRST TOE, AND DEBRIDEMENT OF LEFT  SECOND, THIRD AND FOURTH TOES;  Surgeon: Larina Earthly, MD;  Location: [facility] OR;  Service: Vascular;  Laterality: Left;   Past Medical History:  Diagnosis Date   COVID-19 02/06/2019   Diabetes mellitus without complication (HCC)    Hypertension    PE (pulmonary thromboembolism) (HCC)    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   Pneumonia    with Covid   Stroke (HCC)    weakness on right side, occurred while he had Covid   BP 133/81   Pulse 69   Ht 5\' 7"  (1.702 m)   Wt 197 lb (89.4 kg)   SpO2 94%   BMI 30.85 kg/m   Opioid Risk Score:   Fall Risk Score:  `1  Depression screen [facility] 2/9     03/27/2022    3:25 PM 11/30/2021    2:34 PM 09/26/2021    3:44 PM 08/13/2021    3:01 PM 06/26/2021    1:23 PM 05/01/2021    3:46 PM 04/30/2021    3:21 PM  Depression screen PHQ 2/9  Decreased Interest 3 3 0 0 0 1 1  Down, Depressed, Hopeless 3 3 0 0 0 1 1  PHQ - 2 Score 6 6 0 0 0 2 2  Altered sleeping 3  3  0 1   Tired, decreased energy 3  3  0 1   Change in appetite 3  0  0 1   Feeling bad or failure about yourself  3  0      Trouble concentrating 3  3  0 0   Moving slowly or fidgety/restless 3  0  0 0   Suicidal thoughts 1  0  0 0   PHQ-9 Score 25  9  0 5   Difficult doing work/chores Very difficult           Review of Systems  Constitutional:  Positive for fever.       Night sweats weight gain  Respiratory:  Positive for cough and shortness of breath.        Sleep apnea  Gastrointestinal:  Positive for abdominal pain.       Poor appetite  Endocrine:       Limb swelling      Objective:   Physical Exam Awake, alert, appropriate, spanish speaking; accompanied by interpretor; using single point cane to walk, NAD Dysphonia- ~ 70%- better than was, but still straining to speak at normal volume.         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 Original admission 01/26/19- and came to CIR 03/23/19- discharged- 04/20/19.  Has visual difficulties- double vision due to stroke.  Here for f/u on stroke as well as nerve pain and DM. 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     Educated on Diabetes and BG's compared to A1c- that BG's of 120s is usually compared A1c of 6.5 or so. To try and get A1c 6-7, not 7-8.   2. Getting disability now! Just got it!   3 .Con't Celexa/Citalopram 20 mg daily- doesn't want to increase at this time.    4. Con't Tramadol- 100 mg 2x/day- with 5 refills   5. Con't  Lyrica/Pregabalin 150 mg 3x/day- 5 refills.   6. Discussed options about getting injections for vocal cord paralysis and dysphonia- and discussed risks and benefits. I'm hesitant to have him get, but since he's really straining to talk- so, reasonable to get, but I am concerned of infection, complications- get A1c to lowest level possible and then do, so has the least chance of an infection or complication.   7. Urine drug screen- due today- since I've been out and missed to see me as a result.   8. F/U in 3 months- double visit-    I spent a total of 31   minutes on total care today- >50% coordination of care- due to discussion about vocal cord injection; UDS and f/u- and education on DM

## 2022-05-10 NOTE — Progress Notes (Deleted)
Subjective:    Patient ID: Andrew Bruce, male    DOB: 09-14-1965, 57 y.o.   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 Original admission 01/26/19- and came to CIR 03/23/19- discharged- 04/20/19.  Has visual difficulties- double vision due to stroke.  Here for f/u on stroke as well as nerve pain and DM.   Not any better than last time here.   Restarted tramadol at last visit-  Finished them- ran out 15 days ago.  Feet feels numb and heavy- due to diabetic neuropathy , but doesn't understand this.   Feels like strength is "weak"-   Also depressed.    Pain Inventory Average Pain 9 Pain Right Now 9 My pain is burning sharp, tingling  In the last 24 hours, has pain interfered with the following? General activity 7 Relation with others 0 Enjoyment of life 7 What TIME of day is your pain at its worst? morning  Sleep (in general) Poor  Pain is worse with: inactivity Pain improves with:  na Relief from Meds:  na  Family History  Problem Relation Age of Onset   Healthy Sister    Healthy Brother    Social History   Socioeconomic History   Marital status: Married    Spouse name: Not on file   Number of children: Not on file   Years of education: Not on file   Highest education level: Not on file  Occupational History   Not on file  Tobacco Use   Smoking status: Former   Smokeless tobacco: Never  Vaping Use   Vaping Use: Never used  Substance and Sexual Activity   Alcohol use: Yes    Comment: rare to occasional   Drug use: Never   Sexual activity: Yes    Birth  control/protection: None  Other Topics Concern   Not on file  Social History Narrative   Not on file   Social Determinants of Health   Financial Resource Strain: Not on file  Food Insecurity: Not on file  Transportation Needs: Not on file  Physical Activity: Not on file  Stress: Not on file  Social Connections: Not on file   Past Surgical History:  Procedure Laterality Date   AMPUTATION Right 08/13/2019   Procedure: AMPUTATION RIGHT FIRST AND SECOND TOES;  Surgeon: Larina Earthly, MD;  Location: [facility] OR;  Service: Vascular;  Laterality: Right;   DIRECT LARYNGOSCOPY Bilateral 04/06/2019   Procedure: MICRO DIRECT LARYNGOSCOPY WITH PROLARYN INJECTION;  Surgeon: Christia [place], MD;  Location: [facility] OR;  Service: ENT;  Laterality: Bilateral;   IR GASTROSTOMY TUBE MOD SED  03/10/2019   IR GASTROSTOMY TUBE REMOVAL  04/20/2019   WOUND DEBRIDEMENT Left 08/13/2019   Procedure: AMPUTATION  OF LEFT FIRST TOE, AND DEBRIDEMENT OF LEFT SECOND, THIRD AND FOURTH TOES;  Surgeon: Larina Earthly, MD;  Location: [facility] OR;  Service: Vascular;  Laterality: Left;   Past Surgical History:  Procedure Laterality Date   AMPUTATION Right 08/13/2019   Procedure: AMPUTATION RIGHT FIRST AND SECOND TOES;  Surgeon: Larina Earthly, MD;  Location:  [facility] OR;  Service: Vascular;  Laterality: Right;   DIRECT LARYNGOSCOPY Bilateral 04/06/2019   Procedure: MICRO DIRECT LARYNGOSCOPY WITH PROLARYN INJECTION;  Surgeon: Christia Reading, MD;  Location: [facility] OR;  Service: ENT;  Laterality: Bilateral;   IR GASTROSTOMY TUBE MOD SED  03/10/2019   IR GASTROSTOMY TUBE REMOVAL  04/20/2019   WOUND DEBRIDEMENT Left 08/13/2019   Procedure: AMPUTATION  OF LEFT FIRST TOE, AND DEBRIDEMENT OF LEFT SECOND, THIRD AND FOURTH TOES;  Surgeon: Larina Earthly, MD;  Location: [facility] OR;  Service: Vascular;  Laterality: Left;   Past Medical History:  Diagnosis Date   COVID-19 02/06/2019   Diabetes mellitus without complication (HCC)    Hypertension    PE (pulmonary  thromboembolism) (HCC)    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   Pneumonia    with Covid   Stroke (HCC)    weakness on right side, occurred while he had Covid   BP 133/81   Pulse 69   Ht 5\' 7"  (1.702 m)   Wt 197 lb (89.4 kg)   SpO2 94%   BMI 30.85 kg/m   Opioid Risk Score:   Fall Risk Score:  `1  Depression screen [facility] 2/9     03/27/2022    3:25 PM 11/30/2021    2:34 PM 09/26/2021    3:44 PM 08/13/2021    3:01 PM 06/26/2021    1:23 PM 05/01/2021    3:46 PM 04/30/2021    3:21 PM  Depression screen PHQ 2/9  Decreased Interest 3 3 0 0 0 1 1  Down, Depressed, Hopeless 3 3 0 0 0 1 1  PHQ - 2 Score 6 6 0 0 0 2 2  Altered sleeping 3  3  0 1   Tired, decreased energy 3  3  0 1   Change in appetite 3  0  0 1   Feeling bad or failure about yourself  3  0      Trouble concentrating 3  3  0 0   Moving slowly or fidgety/restless 3  0  0 0   Suicidal thoughts 1  0  0 0   PHQ-9 Score 25  9  0 5   Difficult doing work/chores Very difficult           Review of Systems  Constitutional: Negative.   HENT: Negative.    Eyes: Negative.   Respiratory: Negative.    Cardiovascular: Negative.   Gastrointestinal: Negative.   Endocrine: Negative.   Genitourinary: Negative.   Musculoskeletal:        Leg pain  Skin: Negative.   Allergic/Immunologic: Negative.   Neurological: Negative.   Hematological: Negative.   Psychiatric/Behavioral:  Positive for dysphoric mood.   All other systems reviewed and are negative.      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 Original admission 01/26/19- and came to CIR 03/23/19- discharged- 04/20/19.  Has visual difficulties- double vision due to stroke.  Here for f/u on stroke as well as nerve pain and DM.    Needs  refill on tramadol- ran out- 100 mg 2x/day for nerve and joint pain.   2   A1c is 9.4- need to cut down on carbohydrates and sugars and check blood sugars. I cannot fix this- you are doing damage to your feet and causing more pain and numbness.  Is a marathon, not a short race.   3  eat a little  salt, not a lot- salt from your hand. Will make food boring!  4. Had a hard conversation about diet- if it tastes good- it's probably not good for you!  5. Chicken, not ham or sausage or beef; can also eat seafood; only pork chops or pork loin, not processed pork.  Veggies and a little fruit-   6. Celexa/Citalopram 20 mg daily- for mood  7. Will refill Lyrica 150 mg TID and Keppra 500 mg BID for nerve pain- sent to pharmacy  8. Strength is the same- will not get better since stroke anymore- we are where it will be.   9. Pt still meets criteria for disability- has talked to attorney- will give letter discussing how he's not appropriate to work.    10. F/U in 3 months for f/u on chronic pain.    I spent a total of   32 minutes on total care today- >50% coordination of care- due to d/w pt about diet, DM, and disability- using interpretor

## 2022-05-10 NOTE — Patient Instructions (Signed)
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 Original admission 01/26/19- and came to CIR 03/23/19- discharged- 04/20/19.  Has visual difficulties- double vision due to stroke.  Here for f/u on stroke as well as nerve pain and DM. 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     Educated on Diabetes and BG's compared to A1c- that BG's of 120s is usually compared A1c of 6.5 or so. To try and get A1c 6-7, not 7-8.   2. Getting disability now! Just got it!   3 .Con't Celexa/Citalopram 20 mg daily- doesn't want to increase at this time.    4. Con't Tramadol- 100 mg 2x/day- with 5 refills   5. Con't  Lyrica/Pregabalin 150 mg 3x/day- 5 refills.   6. Discussed options about getting injections for vocal cord paralysis and dysphonia- and discussed risks and benefits. I'm hesitant to have him get, but since he's really straining to talk- so, reasonable to get, but I am concerned of infection, complications- get A1c to lowest level possible and then do, so has the least chance of an infection or complication.   7. Urine drug screen- due today- since I've been out and missed to see me as a result.   8. F/U in 3 months- double visit-

## 2022-05-13 ENCOUNTER — Other Ambulatory Visit: Payer: Self-pay

## 2022-05-14 ENCOUNTER — Other Ambulatory Visit: Payer: Self-pay

## 2022-05-15 NOTE — Therapy (Signed)
OCCUPATIONAL THERAPY DISCHARGE SUMMARY  Patient last seen by OT 12/2021. Pt has not had any more visits since that time. OT to discharge pt from caseload and resolve episode.

## 2022-05-16 LAB — TOXASSURE SELECT,+ANTIDEPR,UR

## 2022-05-20 ENCOUNTER — Telehealth: Payer: Self-pay

## 2022-05-20 ENCOUNTER — Other Ambulatory Visit: Payer: Self-pay

## 2022-05-20 NOTE — Telephone Encounter (Signed)
Lvm for pt to call back to make an appointment for forms.  Will keep forms at my desk in hold folder and appt desk will need a note that forms are at my desk. KH

## 2022-05-21 ENCOUNTER — Other Ambulatory Visit: Payer: Self-pay

## 2022-05-27 ENCOUNTER — Ambulatory Visit (INDEPENDENT_AMBULATORY_CARE_PROVIDER_SITE_OTHER): Payer: Medicare Other | Admitting: Nurse Practitioner

## 2022-05-27 DIAGNOSIS — L97509 Non-pressure chronic ulcer of other part of unspecified foot with unspecified severity: Secondary | ICD-10-CM | POA: Diagnosis not present

## 2022-05-27 DIAGNOSIS — E11621 Type 2 diabetes mellitus with foot ulcer: Secondary | ICD-10-CM

## 2022-05-27 DIAGNOSIS — I10 Essential (primary) hypertension: Secondary | ICD-10-CM | POA: Diagnosis not present

## 2022-05-27 MED ORDER — AMLODIPINE BESYLATE 10 MG PO TABS: 10.0000 mg | ORAL_TABLET | Freq: Every day | ORAL | 2 refills | Status: DC

## 2022-05-27 MED ORDER — GLIPIZIDE 5 MG PO TABS: 5.0000 mg | ORAL_TABLET | Freq: Every day | ORAL | 2 refills | Status: DC

## 2022-05-27 MED ORDER — LISINOPRIL 40 MG PO TABS: 40.0000 mg | ORAL_TABLET | Freq: Every day | ORAL | 2 refills | Status: DC

## 2022-05-27 NOTE — Progress Notes (Unsigned)
@Patient  ID: Andrew Bruce, male    DOB: Dec 03, 1965, 57 y.o.   MRN: [id number]  No chief complaint on file.   Referring provider: [name], NP   HPI      No Known Allergies  Immunization History  Administered Date(s) Administered   Influenza,inj,Quad PF,6+ Mos 03/25/2019, 11/17/2020   Pneumococcal Polysaccharide-23 03/25/2019, 11/17/2020    Past Medical History:  Diagnosis Date   COVID-19 02/06/2019   Diabetes mellitus without complication (HCC)    Hypertension    PE (pulmonary thromboembolism) (HCC)    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   Pneumonia    with Covid   Stroke (HCC)    weakness on right side, occurred while he had Covid    Tobacco History: Social History   Tobacco Use  Smoking Status Former  Smokeless Tobacco Never   Counseling given: Not Answered   Outpatient Encounter Medications as of 05/27/2022  Medication Sig   acetaminophen (TYLENOL) 500 MG tablet Take by mouth.   amitriptyline (ELAVIL) 25 MG tablet Take 1 tablet (25 mg total) by mouth at bedtime.   amLODipine (NORVASC) 10 MG tablet Take 1 tablet (10 mg total) by mouth daily.   aspirin EC 81 MG tablet Take 1 tablet (81 mg total) by mouth daily. Swallow whole.   atorvastatin (LIPITOR) 40 MG tablet Take 1 tablet (40 mg total) by mouth daily.   citalopram (CELEXA) 20 MG tablet Take 1 tablet (20 mg total) by mouth daily. For mood   citalopram (CELEXA) 20 MG tablet Take 1 tablet (20 mg total) by mouth daily.   glipiZIDE (GLUCOTROL) 5 MG tablet Take 1 tablet (5 mg total) by mouth daily before breakfast.   ibuprofen (ADVIL) 800 MG tablet Take by mouth.   lisinopril (ZESTRIL) 40 MG tablet Take 1 tablet (40 mg total) by mouth daily.   melatonin (MELATONIN MAXIMUM STRENGTH) 5 MG TABS Take by mouth.   metFORMIN (GLUCOPHAGE) 500 MG tablet Take 1 tablet (500 mg total) by mouth 2 (two) times daily with a meal.   metoprolol tartrate (LOPRESSOR) 25 MG tablet Take 1 tablet  (25 mg total) by mouth 2 (two) times daily. For blood pressure   pregabalin (LYRICA) 150 MG capsule Take 1 capsule (150 mg total) by mouth 3 (three) times daily. - for nerve pain   traMADol (ULTRAM) 50 MG tablet Take 2 tablets (100 mg total) by mouth 2 (two) times daily. For chronic pain   No facility-administered encounter medications on file as of 05/27/2022.     Review of Systems  Review of Systems     Physical Exam  There were no vitals taken for this visit.  Wt Readings from Last 5 Encounters:  05/10/22 197 lb (89.4 kg)  03/27/22 198 lb 12.8 oz (90.2 kg)  12/26/21 196 lb (88.9 kg)  11/30/21 197 lb 9.6 oz (89.6 kg)  09/28/21 193 lb (87.5 kg)     Physical Exam   Lab Results:  CBC    Component Value Date/Time   WBC 8.5 09/26/2021 1620   WBC 6.1 08/13/2019 0844   RBC 4.97 09/26/2021 1620   RBC 4.82 08/13/2019 0844   HGB 14.4 09/26/2021 1620   HCT 42.5 09/26/2021 1620   PLT 292 09/26/2021 1620   MCV 86 09/26/2021 1620   MCH 29.0 09/26/2021 1620   MCH 28.0 08/13/2019 0844   MCHC 33.9 09/26/2021 1620   MCHC 32.1 08/13/2019 0844   RDW 11.8 09/26/2021 1620  LYMPHSABS 2.6 03/14/2021 1627   MONOABS 0.4 04/15/2019 0620   EOSABS 0.2 03/14/2021 1627   BASOSABS 0.0 03/14/2021 1627    BMET    Component Value Date/Time   NA 140 09/26/2021 1620   K 4.9 09/26/2021 1620   CL 103 09/26/2021 1620   CO2 21 09/26/2021 1620   GLUCOSE 271 (H) 09/26/2021 1620   GLUCOSE 119 (H) 08/13/2019 0844   BUN 25 (H) 09/26/2021 1620   CREATININE 1.06 09/26/2021 1620   CALCIUM 9.4 09/26/2021 1620   GFRNONAA 104 03/06/2020 1414   GFRAA 120 03/06/2020 1414    BNP    Component Value Date/Time   BNP 65.4 03/18/2019 0427    ProBNP No results found for: "PROBNP"  Imaging: No results found.   Assessment & Plan:   No problem-specific Assessment & Plan notes found for this encounter.     [name], NP 05/27/2022

## 2022-05-29 ENCOUNTER — Encounter: Payer: Self-pay | Admitting: Nurse Practitioner

## 2022-05-29 NOTE — Assessment & Plan Note (Signed)
-   glipiZIDE (GLUCOTROL) 5 MG tablet; Take 1 tablet (5 mg total) by mouth daily before breakfast.  Dispense: 30 tablet; Refill: 2  2. Hypertension, unspecified type  - amLODipine (NORVASC) 10 MG tablet; Take 1 tablet (10 mg total) by mouth daily.  Dispense: 30 tablet; Refill: 2  Follow up:  Follow up in 3 months

## 2022-05-29 NOTE — Patient Instructions (Signed)
1. Type 2 diabetes, controlled, with ulcer of toe (HCC)  - glipiZIDE (GLUCOTROL) 5 MG tablet; Take 1 tablet (5 mg total) by mouth daily before breakfast.  Dispense: 30 tablet; Refill: 2  2. Hypertension, unspecified type  - amLODipine (NORVASC) 10 MG tablet; Take 1 tablet (10 mg total) by mouth daily.  Dispense: 30 tablet; Refill: 2  Follow up:  Follow up in 3 months

## 2022-06-05 ENCOUNTER — Telehealth: Payer: Self-pay | Admitting: *Deleted

## 2022-06-05 NOTE — Telephone Encounter (Signed)
Urine drug screen for this encounter is negative for prescribed medication tramadol. This would be consistent as he reported he was not taking the medication.

## 2022-06-19 DIAGNOSIS — J3801 Paralysis of vocal cords and larynx, unilateral: Secondary | ICD-10-CM | POA: Diagnosis not present

## 2022-06-19 DIAGNOSIS — R490 Dysphonia: Secondary | ICD-10-CM | POA: Diagnosis not present

## 2022-06-27 ENCOUNTER — Ambulatory Visit: Payer: Medicare Other | Admitting: Nurse Practitioner

## 2022-06-27 ENCOUNTER — Encounter: Payer: Self-pay | Admitting: Nurse Practitioner

## 2022-06-27 VITALS — BP 153/71 | HR 80 | Ht 67.0 in | Wt 197.0 lb

## 2022-06-27 DIAGNOSIS — R490 Dysphonia: Secondary | ICD-10-CM

## 2022-06-27 DIAGNOSIS — L97509 Non-pressure chronic ulcer of other part of unspecified foot with unspecified severity: Secondary | ICD-10-CM | POA: Diagnosis not present

## 2022-06-27 DIAGNOSIS — E11621 Type 2 diabetes mellitus with foot ulcer: Secondary | ICD-10-CM | POA: Diagnosis not present

## 2022-06-27 DIAGNOSIS — Z13220 Encounter for screening for lipoid disorders: Secondary | ICD-10-CM | POA: Diagnosis not present

## 2022-06-27 LAB — POCT GLYCOSYLATED HEMOGLOBIN (HGB A1C): Hemoglobin A1C: 7.2 % — AB (ref 4.0–5.6)

## 2022-06-27 MED ORDER — CITALOPRAM HYDROBROMIDE 20 MG PO TABS: 20.0000 mg | ORAL_TABLET | Freq: Every day | ORAL | 5 refills | Status: DC

## 2022-06-27 MED ORDER — ATORVASTATIN CALCIUM 40 MG PO TABS: 40.0000 mg | ORAL_TABLET | Freq: Every day | ORAL | 3 refills | Status: DC

## 2022-06-27 MED ORDER — GLIPIZIDE 5 MG PO TABS: 5.0000 mg | ORAL_TABLET | Freq: Every day | ORAL | 2 refills | Status: DC

## 2022-06-27 NOTE — Progress Notes (Signed)
@Patient  ID: Andrew Bruce, male    DOB: [birth year], 57 y.o.   MRN: [id number]  Chief Complaint  Patient presents with   Follow-up    Referring provider: [name], NP   HPI  Andrew Bruce 57 y.o. male  has a past medical history of COVID-19 (02/06/2019), Diabetes mellitus without complication (HCC), Hypertension, PE (pulmonary thromboembolism) (HCC), Pneumonia, and Stroke (HCC). To the [facility] for 3 mth follow up    Diabetes:   Patient presents for diabetic follow up. Currently taking metformin and glipizide. Complaint with diabetic diet. Taking medications. A1C today is 7.2. A1C was 8.6 at last visit. We have placed a referral for diabetic medication management with pharmacist.    Hypertension:   Taking medications as directed. Checking blood pressures at home - within normal range. No issues or concerns.  Patient states that he was recently seen for hoarse voice.  He states that they have not helped him.  We will place a referral for speech therapy for him.  Denies f/c/s, n/v/d, hemoptysis, PND, leg swelling Denies chest pain or edema      No Known Allergies  Immunization History  Administered Date(s) Administered   Influenza,inj,Quad PF,6+ Mos 03/25/2019, 11/17/2020   Pneumococcal Polysaccharide-23 03/25/2019, 11/17/2020    Past Medical History:  Diagnosis Date   COVID-19 02/06/2019   Diabetes mellitus without complication (HCC)    Hypertension    PE (pulmonary thromboembolism) (HCC)    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   Pneumonia    with Covid   Stroke (HCC)    weakness on right side, occurred while he had Covid    Tobacco History: Social History   Tobacco Use  Smoking Status Former  Smokeless Tobacco Never   Counseling given: Not Answered   Outpatient Encounter Medications as of 06/27/2022  Medication Sig   acetaminophen (TYLENOL) 500 MG tablet Take by mouth.   amitriptyline (ELAVIL) 25 MG tablet Take 1 tablet (25  mg total) by mouth at bedtime.   amLODipine (NORVASC) 10 MG tablet Take 1 tablet (10 mg total) by mouth daily.   aspirin EC 81 MG tablet Take 1 tablet (81 mg total) by mouth daily. Swallow whole.   ibuprofen (ADVIL) 800 MG tablet Take by mouth.   lisinopril (ZESTRIL) 40 MG tablet Take 1 tablet (40 mg total) by mouth daily.   metFORMIN (GLUCOPHAGE) 500 MG tablet Take 1 tablet (500 mg total) by mouth 2 (two) times daily with a meal.   metoprolol tartrate (LOPRESSOR) 25 MG tablet Take 1 tablet (25 mg total) by mouth 2 (two) times daily. For blood pressure   pregabalin (LYRICA) 150 MG capsule Take 1 capsule (150 mg total) by mouth 3 (three) times daily. - for nerve pain   traMADol (ULTRAM) 50 MG tablet Take 2 tablets (100 mg total) by mouth 2 (two) times daily. For chronic pain   [DISCONTINUED] atorvastatin (LIPITOR) 40 MG tablet Take 1 tablet (40 mg total) by mouth daily.   [DISCONTINUED] citalopram (CELEXA) 20 MG tablet Take 1 tablet (20 mg total) by mouth daily. For mood   [DISCONTINUED] glipiZIDE (GLUCOTROL) 5 MG tablet Take 1 tablet (5 mg total) by mouth daily before breakfast.   atorvastatin (LIPITOR) 40 MG tablet Take 1 tablet (40 mg total) by mouth daily.   citalopram (CELEXA) 20 MG tablet Take 1 tablet (20 mg total) by mouth daily. (Patient not taking: Reported on 05/27/2022)   citalopram (CELEXA) 20 MG tablet Take 1  tablet (20 mg total) by mouth daily. For mood   glipiZIDE (GLUCOTROL) 5 MG tablet Take 1 tablet (5 mg total) by mouth daily before breakfast.   melatonin (MELATONIN MAXIMUM STRENGTH) 5 MG TABS Take by mouth. (Patient not taking: Reported on 06/27/2022)   No facility-administered encounter medications on file as of 06/27/2022.     Review of Systems  Review of Systems  Constitutional: Negative.   HENT:  Positive for voice change.   Cardiovascular: Negative.   Gastrointestinal: Negative.   Allergic/Immunologic: Negative.   Neurological: Negative.   Psychiatric/Behavioral:  Negative.         Physical Exam  BP (!) 153/71   Pulse 80   Ht 5\' 7"  (1.702 m)   Wt 197 lb (89.4 kg)   SpO2 97%   BMI 30.85 kg/m   Wt Readings from Last 5 Encounters:  06/27/22 197 lb (89.4 kg)  05/27/22 197 lb 3.2 oz (89.4 kg)  05/10/22 197 lb (89.4 kg)  03/27/22 198 lb 12.8 oz (90.2 kg)  12/26/21 196 lb (88.9 kg)     Physical Exam Vitals and nursing note reviewed.  Constitutional:      General: He is not in acute distress.    Appearance: He is well-developed.  Cardiovascular:     Rate and Rhythm: Normal rate and regular rhythm.  Pulmonary:     Effort: Pulmonary effort is normal.     Breath sounds: Normal breath sounds.  Skin:    General: Skin is warm and dry.  Neurological:     Mental Status: He is alert and oriented to person, place, and time.      Lab Results:  CBC    Component Value Date/Time   WBC 7.2 06/27/2022 1533   WBC 6.1 08/13/2019 0844   RBC 4.94 06/27/2022 1533   RBC 4.82 08/13/2019 0844   HGB 14.2 06/27/2022 1533   HCT 42.3 06/27/2022 1533   PLT 299 06/27/2022 1533   MCV 86 06/27/2022 1533   MCH 28.7 06/27/2022 1533   MCH 28.0 08/13/2019 0844   MCHC 33.6 06/27/2022 1533   MCHC 32.1 08/13/2019 0844   RDW 12.5 06/27/2022 1533   LYMPHSABS 2.6 03/14/2021 1627   MONOABS 0.4 04/15/2019 0620   EOSABS 0.2 03/14/2021 1627   BASOSABS 0.0 03/14/2021 1627    BMET    Component Value Date/Time   NA 141 06/27/2022 1533   K 4.1 06/27/2022 1533   CL 103 06/27/2022 1533   CO2 19 (L) 06/27/2022 1533   GLUCOSE 205 (H) 06/27/2022 1533   GLUCOSE 119 (H) 08/13/2019 0844   BUN 15 06/27/2022 1533   CREATININE 0.81 06/27/2022 1533   CALCIUM 9.2 06/27/2022 1533   GFRNONAA 104 03/06/2020 1414   GFRAA 120 03/06/2020 1414    BNP    Component Value Date/Time   BNP 65.4 03/18/2019 0427      Assessment & Plan:   Type 2 diabetes, controlled, with ulcer of toe (HCC) - POCT glycosylated hemoglobin (Hb A1C) - glipiZIDE (GLUCOTROL) 5 MG tablet;  Take 1 tablet (5 mg total) by mouth daily before breakfast.  Dispense: 30 tablet; Refill: 2 - CBC - Comprehensive metabolic panel  2. Lipid screening  - Lipid Panel   3. Hoarse voice quality  - Ambulatory referral to Speech Therapy   Follow up:  Follow up in 3 months     [name], NP 06/28/2022

## 2022-06-27 NOTE — Patient Instructions (Addendum)
1. Type 2 diabetes, controlled, with ulcer of toe (HCC)  - POCT glycosylated hemoglobin (Hb A1C) - glipiZIDE (GLUCOTROL) 5 MG tablet; Take 1 tablet (5 mg total) by mouth daily before breakfast.  Dispense: 30 tablet; Refill: 2 - CBC - Comprehensive metabolic panel  2. Lipid screening  - Lipid Panel   3. Hoarse voice quality  - Ambulatory referral to Speech Therapy   Follow up:  Follow up in 3 months

## 2022-06-28 ENCOUNTER — Other Ambulatory Visit: Payer: Self-pay | Admitting: Nurse Practitioner

## 2022-06-28 ENCOUNTER — Encounter: Payer: Self-pay | Admitting: Nurse Practitioner

## 2022-06-28 LAB — CBC
Hematocrit: 42.3 % (ref 37.5–51.0)
Hemoglobin: 14.2 g/dL (ref 13.0–17.7)
MCH: 28.7 pg (ref 26.6–33.0)
MCHC: 33.6 g/dL (ref 31.5–35.7)
MCV: 86 fL (ref 79–97)
Platelets: 299 10*3/uL (ref 150–450)
RBC: 4.94 x10E6/uL (ref 4.14–5.80)
RDW: 12.5 % (ref 11.6–15.4)
WBC: 7.2 10*3/uL (ref 3.4–10.8)

## 2022-06-28 LAB — COMPREHENSIVE METABOLIC PANEL
ALT: 37 IU/L (ref 0–44)
AST: 26 IU/L (ref 0–40)
Albumin/Globulin Ratio: 1.9
Albumin: 4.5 g/dL (ref 3.8–4.9)
Alkaline Phosphatase: 83 IU/L (ref 44–121)
BUN/Creatinine Ratio: 19 (ref 9–20)
BUN: 15 mg/dL (ref 6–24)
Bilirubin Total: 0.2 mg/dL (ref 0.0–1.2)
CO2: 19 mmol/L — ABNORMAL LOW (ref 20–29)
Calcium: 9.2 mg/dL (ref 8.7–10.2)
Chloride: 103 mmol/L (ref 96–106)
Creatinine, Ser: 0.81 mg/dL (ref 0.76–1.27)
Globulin, Total: 2.4 g/dL (ref 1.5–4.5)
Glucose: 205 mg/dL — ABNORMAL HIGH (ref 70–99)
Potassium: 4.1 mmol/L (ref 3.5–5.2)
Sodium: 141 mmol/L (ref 134–144)
Total Protein: 6.9 g/dL (ref 6.0–8.5)
eGFR: 104 mL/min/{1.73_m2} (ref >59)

## 2022-06-28 LAB — LIPID PANEL
Chol/HDL Ratio: 5 ratio (ref 0.0–5.0)
Cholesterol, Total: 239 mg/dL — ABNORMAL HIGH (ref 100–199)
HDL: 48 mg/dL (ref >39)
LDL Chol Calc (NIH): 142 mg/dL — ABNORMAL HIGH (ref 0–99)
Triglycerides: 272 mg/dL — ABNORMAL HIGH (ref 0–149)
VLDL Cholesterol Cal: 49 mg/dL — ABNORMAL HIGH (ref 5–40)

## 2022-06-28 MED ORDER — ATORVASTATIN CALCIUM 40 MG PO TABS: 40.0000 mg | ORAL_TABLET | Freq: Every day | ORAL | 3 refills | Status: DC

## 2022-06-28 NOTE — Assessment & Plan Note (Signed)
-   POCT glycosylated hemoglobin (Hb A1C) - glipiZIDE (GLUCOTROL) 5 MG tablet; Take 1 tablet (5 mg total) by mouth daily before breakfast.  Dispense: 30 tablet; Refill: 2 - CBC - Comprehensive metabolic panel  2. Lipid screening  - Lipid Panel   3. Hoarse voice quality  - Ambulatory referral to Speech Therapy   Follow up:  Follow up in 3 months

## 2022-07-09 NOTE — Therapy (Deleted)
OUTPATIENT SPEECH LANGUAGE PATHOLOGY VOICE EVALUATION   Patient Name: Andrew Bruce MRN: [id number] DOB:01-25-1965, 57 y.o., male Today's Date: 07/09/2022  PCP: Cecile Hearing Provider (PCP)  REFERRING PROVIDER: [facility] Provider (PCP)   END OF SESSION:   Past Medical History:  Diagnosis Date   COVID-19 02/06/2019   Diabetes mellitus without complication (HCC)    Hypertension    PE (pulmonary thromboembolism) (HCC)    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   Pneumonia    with Covid   Stroke (HCC)    weakness on right side, occurred while he had Covid   Past Surgical History:  Procedure Laterality Date   AMPUTATION Right 08/13/2019   Procedure: AMPUTATION RIGHT FIRST AND SECOND TOES;  Surgeon: Larina Earthly, MD;  Location: [facility] OR;  Service: Vascular;  Laterality: Right;   DIRECT LARYNGOSCOPY Bilateral 04/06/2019   Procedure: MICRO DIRECT LARYNGOSCOPY WITH PROLARYN INJECTION;  Surgeon: Christia Reading, MD;  Location[facility] OR;  Service: ENT;  Laterality: Bilateral;   IR GASTROSTOMY TUBE MOD SED  03/10/2019   IR GASTROSTOMY TUBE REMOVAL  04/20/2019   WOUND DEBRIDEMENT Left 08/13/2019   Procedure: AMPUTATION  OF LEFT FIRST TOE, AND DEBRIDEMENT OF LEFT SECOND, THIRD AND FOURTH TOES;  Surgeon: Larina Earthly, MD;  Location: [facility] OR;  Service: Vascular;  Laterality: Left;   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 07/14/2020   Uncontrolled hypertension 02/18/2020   Impaired functional mobility, balance, gait, and endurance  08/23/2019   Amputation of great toe (HCC) 08/23/2019   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   Impaired ambulation 06/11/2019   Dependent on walker for ambulation 04/30/2019   Right spastic hemiparesis (HCC) 04/30/2019   Tachycardia 04/30/2019   Dependence on cane 04/30/2019   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   Acute blood loss anemia    Uncontrolled type 2 diabetes mellitus with hyperglycemia (HCC)    Dry gangrene (HCC)    Leukocytosis    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   Status post tracheostomy (HCC)    Pressure injury of skin 03/02/2019   On mechanically assisted ventilation (HCC)    Goals of care, counseling/discussion    Palliative care encounter    ARDS (adult respiratory distress syndrome) (HCC)    Cerebral embolism with cerebral infarction 02/17/2019   Acute respiratory failure with hypoxia (HCC)    Pneumothorax, right     Onset date: 06/27/22  REFERRING DIAG:  R49.0 (ICD-10-CM) - Hoarse voice quality      THERAPY DIAG:  No diagnosis found.  Rationale for Evaluation and Treatment: {HABREHAB:27488}  SUBJECTIVE:   SUBJECTIVE STATEMENT: *** Pt accompanied by: {accompnied:27141}  PERTINENT HISTORY: past medical history of COVID-19 (02/06/2019), former tobacco user 0.5 packs a day for three years. 06/19/22: A total of 0.71ml restylane was injected to address dysphonia--right vocal fold hypomobility.   PAIN:  Are you having pain? {OPRCPAIN:27236}  FALLS: Has patient fallen in last 6 months? {yes/no:20286}, Number of falls: ***  LIVING ENVIRONMENT: Lives with: {OPRC lives with:25569::"lives with their  family"} Lives in: {Lives in:25570}  PLOF:Level of assistance: {ZOXWRUE:45409} Employment: {SLPemployment:25674}  PATIENT GOALS: ***  OBJECTIVE:   DIAGNOSTIC FINDINGS: ***  COGNITION: Overall cognitive status: {cognition:24006} Areas of impairment:  {cognitiveimpairmentslp:27409} Functional deficits: ***  SOCIAL  HISTORY: Occupation: Film/video editor intake: {waterintake:27189} Caffeine/alcohol intake: {intakelevel:27191} Daily voice use: {intakelevel:27191}  PERCEPTUAL VOICE ASSESSMENT: Voice quality: {VQL:27192} Vocal abuse: {VA:27193} Resonance: {resonance:27194} Respiratory function: {respbreathing:27195}  OBJECTIVE VOICE ASSESSMENT: Maximum phonation time for sustained "ah": *** Conversational pitch average: *** Hz Conversational pitch range: *** Hz Conversational loudness average: *** dB Conversational loudness range: *** dB S/z ratio: *** (Suggestive of dysfunction >1.0)  PATIENT REPORTED OUTCOME MEASURES (PROM): {SLPPROM:27095}  TODAY'S TREATMENT:                                                                                                                                         DATE:   07/10/22: ***    PATIENT EDUCATION: Education details: *** Person educated: {Person educated:25204} Education method: {Education Method:25205} Education comprehension: {Education Comprehension:25206}  HOME EXERCISE PROGRAM: ***  GOALS: Goals reviewed with patient? {yes/no:20286}  SHORT TERM GOALS: Target date: ***  *** Baseline: Goal status: INITIAL  2.  *** Baseline:  Goal status: INITIAL  3.  *** Baseline:  Goal status: INITIAL  4.  *** Baseline:  Goal status: INITIAL  5.  *** Baseline:  Goal status: INITIAL  6.  *** Baseline:  Goal status: INITIAL  LONG TERM GOALS: Target date: ***  *** Baseline:  Goal status: INITIAL  2.  *** Baseline:  Goal status: INITIAL  3.  *** Baseline:  Goal status: INITIAL  4.  *** Baseline:  Goal status: INITIAL  5.  *** Baseline:  Goal status: INITIAL  6.  *** Baseline:  Goal status: INITIAL  ASSESSMENT:  CLINICAL IMPRESSION: Patient is a *** y.o. *** who was seen today for ***.   OBJECTIVE IMPAIRMENTS: include {SLPOBJIMP:27107}. These impairments are limiting patient from {SLPLIMIT:27108}. Factors affecting  potential to achieve goals and functional outcome are {SLP factors:25450}.. Patient will benefit from skilled SLP services to address above impairments and improve overall function.  REHAB POTENTIAL: {rehabpotential:25112}  PLAN:  SLP FREQUENCY: {rehab frequency:25116}  SLP DURATION: {rehab duration:25117}  PLANNED INTERVENTIONS: {SLP treatment/interventions:25449}    [place], Student-SLP 07/09/2022, 12:08 PM

## 2022-07-10 ENCOUNTER — Ambulatory Visit: Payer: Medicare Other | Attending: Nurse Practitioner | Admitting: Speech Pathology

## 2022-07-15 ENCOUNTER — Ambulatory Visit: Payer: Medicare Other | Attending: Nurse Practitioner

## 2022-07-15 DIAGNOSIS — R490 Dysphonia: Secondary | ICD-10-CM | POA: Insufficient documentation

## 2022-07-15 DIAGNOSIS — R498 Other voice and resonance disorders: Secondary | ICD-10-CM | POA: Diagnosis not present

## 2022-07-15 NOTE — Therapy (Signed)
OUTPATIENT SPEECH LANGUAGE PATHOLOGY VOICE EVALUATION   Patient Name: Andrew Bruce MRN: [id number] DOB:1965/03/11, 57 y.o., male Today's Date: 07/15/2022  PCP: [name], NP REFERRING PROVIDER: [name], NP  END OF SESSION:  End of Session - 07/15/22 0858     Visit Number 1    Number of Visits 17    Date for SLP Re-Evaluation 10/07/22   to account for scheduling   Authorization Type Medicare A & B    SLP Start Time 0930    SLP Stop Time  1017    SLP Time Calculation (min) 47 min    Activity Tolerance Patient tolerated treatment well             Past Medical History:  Diagnosis Date   COVID-19 02/06/2019   Diabetes mellitus without complication (HCC)    Hypertension    PE (pulmonary thromboembolism) (HCC)    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   Pneumonia    with Covid   Stroke (HCC)    weakness on right side, occurred while he had Covid   Past Surgical History:  Procedure Laterality Date   AMPUTATION Right 08/13/2019   Procedure: AMPUTATION RIGHT FIRST AND SECOND TOES;  Surgeon: Larina Earthly, MD;  Location: [facility] OR;  Service: Vascular;  Laterality: Right;   DIRECT LARYNGOSCOPY Bilateral 04/06/2019   Procedure: MICRO DIRECT LARYNGOSCOPY WITH PROLARYN INJECTION;  Surgeon: Christia Reading, MD;  Location: [facility] OR;  Service: ENT;  Laterality: Bilateral;   IR GASTROSTOMY TUBE MOD SED  03/10/2019   IR GASTROSTOMY TUBE REMOVAL  04/20/2019   WOUND DEBRIDEMENT Left 08/13/2019   Procedure: AMPUTATION  OF LEFT FIRST TOE, AND DEBRIDEMENT OF LEFT SECOND, THIRD AND FOURTH TOES;  Surgeon: Larina Earthly, MD;  Location: [facility] OR;  Service: Vascular;  Laterality: Left;   Patient Active Problem List   Diagnosis Date Noted   Type 2 diabetes, controlled, with ulcer of toe (HCC) 09/27/2021   Anxiety 07/20/2021   COVID-19 long hauler 07/20/2021   Depression 07/20/2021   History of severe acute respiratory syndrome coronavirus 2 (SARS-CoV-2) disease 07/20/2021    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   Nerve pain 12/15/2020   Diplopia 10/20/2020   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 07/14/2020   Uncontrolled hypertension 02/18/2020   Impaired functional mobility, balance, gait, and endurance 08/23/2019   Amputation of great toe (HCC) 08/23/2019   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   Impaired ambulation 06/11/2019   Dependent on walker for ambulation 04/30/2019   Right spastic hemiparesis (HCC) 04/30/2019   Tachycardia 04/30/2019   Dependence on cane 04/30/2019   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   Acute blood loss anemia    Uncontrolled type 2 diabetes mellitus with hyperglycemia (HCC)    Dry gangrene (HCC)    Leukocytosis    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   Status post tracheostomy (HCC)    Pressure injury of skin 03/02/2019   On mechanically assisted ventilation (HCC)    Goals of care, counseling/discussion    Palliative care encounter    ARDS (adult respiratory distress syndrome) (HCC)    Cerebral embolism with cerebral infarction 02/17/2019   Acute respiratory failure with hypoxia (HCC)    Pneumothorax, right     Onset date: 2021 (06/27/22=referral)  REFERRING DIAG: R49.0 (ICD-10-CM) - Hoarse voice quality  THERAPY DIAG: Other voice and resonance disorders  Rationale for Evaluation and Treatment: Rehabilitation  SUBJECTIVE:   SUBJECTIVE STATEMENT: "so so. 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 Original admission 01/26/19- and came to CIR 03/23/19- discharged- 04/20/19. Dr. Jenne Pane saw pt 04/05/29 in the hospital for B vocal cord paresis and glottic stenosis with prolaryn and kanalog injections. His outpt  f/u with Dr. Jenne Pane 04/29/19 revealed improvement."  Most recently seen by ENT in Spring 2024 with dx of right vocal fold hypomobility/immobility and MTD with Flexible laryngoscopy with injection augmentation vocal fold completed 06-19-22. Briefly received teletherapy prior to injection.   PAIN: Are you having pain? No  FALLS: Has patient fallen in last 6 months? No  LIVING ENVIRONMENT: Lives with: lives with their family Lives in: House/apartment  PLOF:Level of assistance: Independent with ADLs, Independent with IADLs Employment: On disability  PATIENT GOALS: improve vocal quality  OBJECTIVE:   DIAGNOSTIC FINDINGS:  04/08/22: Flexible Fiberoptic Videostroboscopy  Procedure Details: The patient was placed in the sitting position. After topical anesthesia and decongestion with oxymetazoline and 1% lidocaine, the flexible laryngoscope was passed. The microphone was used to trigger the xenon stroboscopic light source.  - Nasal cavity - There was no pus or polyps noted in the nasal cavity. Septum intact - Nasopharynx/Oropharynx - there was no evidence of mass or lesion. - Hypopharynx - There were no lesions in the pyriformis, pharyngeal walls, or postcricoid space - Larynx - there was mild interarytenoid edema, no erythema. No lesions of the epiglottis, false folds, or aryepiglottic folds - Vocal Folds - Mobility: Right VF immobility Amplitude: Symmetric bilaterally Mucosal Wave: Mucosal wave intact bilaterally Closure: Complete Lesions/Other findings: muscle tension dysphonia - Subglottis - Normal  Pre-op Diagnosis: Right vocal fold hypomobility Dysphonia  Procedures:  06/19/22: Flexible laryngoscopy with injection augmentation vocal fold  Procedure: The  patient was seated in an upright position. The nose was de-congested and anesthetized with oxymetazoline and 1% lidocaine. Inhalation of 1% lidocaine was also undertaken. The injectable material was appropriately prepared in the usual fashion. The patient was prepped and draped in the usual fashion. 1% lidocaine with 1:100,000 epinephrine was used for local anesthesia of the skin overlying the thyrohyoid membrane. Flexible laryngoscope was placed into the nasal cavity and advanced to the larynx. Transcervical laryngeal gargle local anesthesia was undertaken. 22G needle was inserted over they thyroid notch and into the laryngeal lumen. Approximately 1.39ml 4% lidocaine was applied over the glottis with the patient in phonation and into the preepiglottic space. Once appropriate local anesthesia was obtained, we proceeded with augmentation.  22G needle was inserted over the thyroid notch and into the laryngeal lumen. Needle was inserted into the appropriate vocal fold in a lateral position. Approximately 0.3 ml of restylene was injected into the right fold.  Findings: A total of 0.61ml restylane was injected. The patient tolerated the procedure well.   Post-Op Diagnosis: Right vocal fold hypomobility Dysphonia  COGNITION: Overall cognitive status: Within functional limits for tasks assessed  SOCIAL HISTORY: Occupation: on disability  Water intake: optimal Caffeine/alcohol intake: minimal (morning coffee)  Daily  voice use: minimal (reported reduced confidence talking and avoids speaking if possible)   PERCEPTUAL VOICE ASSESSMENT: Voice quality: breathy, low vocal intensity, vocal fatigue, and aphonic (inconsistent)  Vocal abuse: habitual throat clearing (intermittent) Resonance: normal Respiratory function: clavicular breathing and speaking on residual capacity  OBJECTIVE VOICE ASSESSMENT: Maximum phonation time for sustained "ah": 69 dB 3-4 seconds Conversational loudness average: 65  dB Conversational loudness range: 63-68 dB Comments: Noted increased strain and tension during sustained phonation trials, which was not previously as overt in unstructured conversation.   PATIENT REPORTED OUTCOME MEASURES (PROM): 04/08/22 ENT visit  Voice- Related Quality of Life (VR-QOL) Measure: I have trouble speaking loudly or being heard in noisy situations: 3 I run out of air and need to take frequent breaths when talking: 4 I sometimes do not know what will come out when I begin speaking: 3 I am sometimes anxious or frustrated because of my voice: 5 I sometimes get depressed because of my voice: 4 I have trouble using the telephone because of my voice: 3 I have trouble doing my job or practicing my profession because of my voice: 4 I avoid going out socially because of my voice: 4 I have to repeat myself to be understood: 3 I have become less outgoing because of my voice: 4 VRQOL Raw Score: 37 Calculated Score: 32.5  Glottal Function Index: Speaking took extra effort: 4 Throat discomfort or pain after using your voice: 5 Vocal fatigue (voice weakened as you talked): 4 Voice cracks or sounds different: 4 Glottal Function Index Total: 17   TODAY'S TREATMENT:                                                                                                                                         07-15-22: Discussed recent ENT procedure (see above), in which pt believes vocal quality has worsened since. Endorsed speaking is very tiring and effortful since injection. Briefly completed telehealth ST x3-4 visits, with improvement reported prior to injection. Scheduled for ENT f/u on June 19th per patient. Inconsistent vocal quality exhibited today. Voice c/b usual breathiness, intermittent hoarseness, and increased strain during structured assessment. Mild subjective improvement in vocal intensity noted since last OP ST course. Educated and instructed abdominal breathing this session, with  handout provided. Usual clavicular breathing pattern observed, which would correlate to reduced vocal intensity and clarity. Recommended holding off voice therapy until f/u with ENT for treatment recommendations.    PATIENT EDUCATION: Education details: see above Person educated: Patient Education method: Explanation, Demonstration, and Handouts Education comprehension: verbalized understanding, returned demonstration, and needs further education  HOME EXERCISE PROGRAM: Abdominal breathing  GOALS: Goals reviewed with patient? Yes  SHORT TERM GOALS: Target date: 08/12/2022  Pt will complete voice HEP 5/7 days with occasional min A  Baseline: Goal status: INITIAL  2.  Pt will employ abdominal breathing during structured speech tasks with 80% accuracy given  occasional min A  Baseline:  Goal status: INITIAL  3.  Pt will maintain clear voice on structured speech tasks with 80% accuracy given occasional min A  Baseline:  Goal status: INITIAL  4.  Pt will ID hoarse/breathy vocal quality and self-correct with learned technique in short structured conversation given occasional min A  Baseline:  Goal status: INITIAL  LONG TERM GOALS: Target date: 10/07/2022  Pt will carryover voice HEP > 2 weeks given rare min A  Baseline:  Goal status: INITIAL  2.  Pt will employ abdominal breathing during 5-10 minute unstructured conversation given rare min A  Baseline:  Goal status: INITIAL  3.  Pt will maintain clear voice during 5-10 unstructured conversation given rare min A  Baseline:  Goal status: INITIAL  4.  Pt will report subjective improvements in voice quality via PROM at last ST session  Baseline:  Goal status: INITIAL   ASSESSMENT:  CLINICAL IMPRESSION: Patient is a 57 y.o. M who was seen today for hoarseness. Previously known to this clinic for ongoing hoarseness s/p trach/intubation related to COVID-19 in 2021. See "pertinent hx" for additional details. Was previously  recommended repeat ENT assessment, which was just recently completed early 2024 with dx of right vocal cord hypomobility and muscle tension dysphonia. Underwent flexible laryngoscopy with injection augmentation vocal fold on 06-19-22. PCP referred for voice therapy d/t ongoing hoarseness. Pt reported decline in vocal quality following injection. Expressed frustration for ongoing hoarseness and difficulty communicating. Pt scheduled for ENT f/u 07-03-22 per patient. Recommended awaiting ENT f/u to initiate voice therapy for additional guidance. If no additional medical intervention is needed and voice therapy is recommended, skilled ST intervention would be beneficial to optimize vocal quality for increased life participation and QOL.  OBJECTIVE IMPAIRMENTS: include voice disorder. These impairments are limiting patient from effectively communicating at home and in community. Factors affecting potential to achieve goals and functional outcome are ability to learn/carryover information, co-morbidities, cooperation/participation level, and previous level of function.. Patient will benefit from skilled SLP services to address above impairments and improve overall function.  REHAB POTENTIAL: Good  PLAN:  SLP FREQUENCY: 1-2x/week  SLP DURATION: 12 weeks (to account for scheduling)  PLANNED INTERVENTIONS: Environmental controls, Cueing hierachy, Internal/external aids, Functional tasks, Multimodal communication approach, SLP instruction and feedback, Compensatory strategies, Patient/family education, and Re-evaluation    Gracy Racer, CCC-SLP 07/15/2022, 11:12 AM

## 2022-07-29 ENCOUNTER — Ambulatory Visit: Payer: Medicare Other | Admitting: Neurology

## 2022-08-09 ENCOUNTER — Encounter: Payer: Self-pay | Admitting: Physical Medicine and Rehabilitation

## 2022-08-09 ENCOUNTER — Encounter
Payer: Medicare Other | Attending: Physical Medicine and Rehabilitation | Admitting: Physical Medicine and Rehabilitation

## 2022-08-09 ENCOUNTER — Other Ambulatory Visit: Payer: Self-pay

## 2022-08-09 VITALS — BP 136/86 | HR 75 | Ht 67.0 in | Wt 196.0 lb

## 2022-08-09 DIAGNOSIS — I69398 Other sequelae of cerebral infarction: Secondary | ICD-10-CM

## 2022-08-09 DIAGNOSIS — I63119 Cerebral infarction due to embolism of unspecified vertebral artery: Secondary | ICD-10-CM | POA: Diagnosis not present

## 2022-08-09 DIAGNOSIS — I96 Gangrene, not elsewhere classified: Secondary | ICD-10-CM | POA: Insufficient documentation

## 2022-08-09 DIAGNOSIS — U099 Post covid-19 condition, unspecified: Secondary | ICD-10-CM | POA: Diagnosis not present

## 2022-08-09 DIAGNOSIS — S98119A Complete traumatic amputation of unspecified great toe, initial encounter: Secondary | ICD-10-CM | POA: Insufficient documentation

## 2022-08-09 DIAGNOSIS — M792 Neuralgia and neuritis, unspecified: Secondary | ICD-10-CM | POA: Diagnosis not present

## 2022-08-09 DIAGNOSIS — R262 Difficulty in walking, not elsewhere classified: Secondary | ICD-10-CM | POA: Diagnosis not present

## 2022-08-09 DIAGNOSIS — G6281 Critical illness polyneuropathy: Secondary | ICD-10-CM | POA: Diagnosis not present

## 2022-08-09 DIAGNOSIS — R531 Weakness: Secondary | ICD-10-CM

## 2022-08-09 DIAGNOSIS — E119 Type 2 diabetes mellitus without complications: Secondary | ICD-10-CM | POA: Diagnosis not present

## 2022-08-09 DIAGNOSIS — G7281 Critical illness myopathy: Secondary | ICD-10-CM

## 2022-08-09 DIAGNOSIS — Z9989 Dependence on other enabling machines and devices: Secondary | ICD-10-CM | POA: Diagnosis not present

## 2022-08-09 MED ORDER — CITALOPRAM HYDROBROMIDE 20 MG PO TABS
20.0000 mg | ORAL_TABLET | Freq: Every day | ORAL | 5 refills | Status: DC
  Filled 2022-08-09 – 2022-11-04 (×2): qty 30, 30d supply, fill #0
  Filled 2022-11-27: qty 30, 30d supply, fill #1
  Filled 2022-12-26: qty 30, 30d supply, fill #2
  Filled 2023-01-17 – 2023-02-13 (×5): qty 30, 30d supply, fill #3
  Filled 2023-04-16 (×2): qty 30, 30d supply, fill #4
  Filled ????-??-??: fill #2
  Filled ????-??-?? (×2): fill #3

## 2022-08-09 MED ORDER — TRAMADOL HCL 50 MG PO TABS
100.0000 mg | ORAL_TABLET | Freq: Three times a day (TID) | ORAL | 5 refills | Status: DC
  Filled 2022-08-09: qty 180, 30d supply, fill #0
  Filled 2022-11-11 (×3): qty 180, 30d supply, fill #1

## 2022-08-09 MED ORDER — PREGABALIN 150 MG PO CAPS
150.0000 mg | ORAL_CAPSULE | Freq: Three times a day (TID) | ORAL | 5 refills | Status: DC
  Filled 2022-08-09: qty 90, 30d supply, fill #0

## 2022-08-09 NOTE — Patient Instructions (Addendum)
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 Original admission 01/26/19- and came to CIR 03/23/19- discharged- 04/20/19.  Has visual difficulties- double vision due to stroke.  Here for f/u on stroke as well as nerve pain and DM.   Medicare doesn't cover his tramadol- so not taking at all. So hasn't been taking meds- will [facility] to [facility]   2.  Pharmacist said will look into it and will call me back- I gave her my cell phone- so we can determine why meds so expensive ($159) just for BP, DM and HLD meds- and  see if can vastly reduce the cost.   3. Called [facility] to try and get pt meds that I write for- and they are checking to see if can get for him.    4. Asked pt/pharmacist- to get Diabetes, blood pressure and cholesterol meds sent to [facility].   5. Sent secure chat to PCP to have her send meds there in future.    6. All three medicines Celexa, Lyrica and Tramadol together $8- total.   7. We discussed that increased tramadol- and can take up to 3x/day-   8. F/U in 3 months -double appt-   9. Last labs- Tox UDS was 05/10/22- looked good   10. [facility] had no insurance so charged him a Psychologist, occupational, but insurance prices-   11. Call 1800- medicare-[phone] And they should help you sign up for medicare D- look at advantage plan

## 2022-08-09 NOTE — Progress Notes (Signed)
Subjective:    Patient ID: Andrew Bruce, male    DOB: April 02, 1965, 57 y.o.   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 Original admission 01/26/19- and came to CIR 03/23/19- discharged- 04/20/19.  Has visual difficulties- double vision due to stroke.  Here for f/u on stroke as well as nerve pain and DM.    Had to pay for BP meds, cholesterol and DM meds- Medicare didn't cover it.  Cannot afford meds.   Gets meds AT [facility].     Pain Inventory Average Pain 8 Pain Right Now 8 My pain is constant, sharp, burning, stabbing, tingling, and aching  In the last 24 hours, has pain interfered with the following? General activity 7 Relation with others 7 Enjoyment of life 6 What TIME of day is your pain at its worst? morning  Sleep (in general) Fair  Pain is worse with: walking, sitting, and some activites Pain improves with: heat/ice and medication Relief from Meds: 8  Family History  Problem Relation Age of Onset   Healthy Sister    Healthy Brother    Social History   Socioeconomic History   Marital status: Married    Spouse name: Not on file   Number of children: Not on file   Years of education: Not on file   Highest education level: Not on file  Occupational History   Not on file  Tobacco Use   Smoking status: Former   Smokeless tobacco: Never  Vaping Use   Vaping status: Never Used  Substance and Sexual Activity   Alcohol use: Yes    Comment: rare to occasional   Drug use: Never   Sexual activity: Yes    Birth control/protection: None  Other Topics Concern   Not on  file  Social History Narrative   Not on file   Social Determinants of Health   Financial Resource Strain: High Risk (09/21/2021)   Received [facility], [facility]   Overall Financial Resource Strain (CARDIA)    Difficulty of Paying Living Expenses: Very hard  Food Insecurity: Food Insecurity Present (09/21/2021)   Received from [facility], [facility]   Hunger Vital Sign    Worried About Running Out of Food in the Last Year: Often true    Ran Out of Food in the Last Year: Often true  Transportation Needs: No Transportation Needs (09/21/2021)   Received from [facility], [facility]   PRAPARE - Transportation    Lack of Transportation (Medical): No    Lack of Transportation (Non-Medical): No  Physical Activity: Not on file  Stress: Not on file  Social Connections: Not on file   Past Surgical History:  Procedure Laterality Date   AMPUTATION Right 08/13/2019   Procedure: AMPUTATION RIGHT FIRST AND SECOND TOES;  Surgeon: Larina Earthly, MD;  Location: [facility] OR;  Service: Vascular;  Laterality: Right;   DIRECT LARYNGOSCOPY Bilateral 04/06/2019   Procedure: MICRO DIRECT LARYNGOSCOPY WITH PROLARYN INJECTION;  Surgeon: Christia Reading, MD;  Location: [facility] OR;  Service: ENT;  Laterality: Bilateral;   IR GASTROSTOMY TUBE MOD SED  03/10/2019   IR GASTROSTOMY TUBE REMOVAL  04/20/2019   WOUND DEBRIDEMENT Left 08/13/2019   Procedure: AMPUTATION  OF LEFT FIRST TOE, AND DEBRIDEMENT OF LEFT SECOND, THIRD AND FOURTH TOES;  Surgeon: Larina Earthly, MD;  Location: [facility] OR;  Service: Vascular;  Laterality: Left;   Past Surgical History:  Procedure Laterality Date   AMPUTATION Right 08/13/2019   Procedure: AMPUTATION RIGHT FIRST AND SECOND TOES;  Surgeon: Larina Earthly, MD;  Location: [facility] OR;  Service: Vascular;  Laterality: Right;   DIRECT LARYNGOSCOPY Bilateral 04/06/2019   Procedure: MICRO DIRECT LARYNGOSCOPY WITH PROLARYN INJECTION;  Surgeon: Christia Reading, MD;  Location: [facility] OR;  Service:  ENT;  Laterality: Bilateral;   IR GASTROSTOMY TUBE MOD SED  03/10/2019   IR GASTROSTOMY TUBE REMOVAL  04/20/2019   WOUND DEBRIDEMENT Left 08/13/2019   Procedure: AMPUTATION  OF LEFT FIRST TOE, AND DEBRIDEMENT OF LEFT SECOND, THIRD AND FOURTH TOES;  Surgeon: Larina Earthly, MD;  Location: [facility] OR;  Service: Vascular;  Laterality: Left;   Past Medical History:  Diagnosis Date   COVID-19 02/06/2019   Diabetes mellitus without complication (HCC)    Hypertension    PE (pulmonary thromboembolism) (HCC)    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   Pneumonia    with Covid   Stroke (HCC)    weakness on right side, occurred while he had Covid   BP 136/86   Pulse 75   Ht 5\' 7"  (1.702 m)   Wt 196 lb (88.9 kg)   SpO2 96%   BMI 30.70 kg/m   Opioid Risk Score:   Fall Risk Score:  `1  Depression screen [facility] 2/9     06/27/2022    2:51 PM 03/27/2022    3:25 PM 11/30/2021    2:34 PM 09/26/2021    3:44 PM 08/13/2021    3:01 PM 06/26/2021    1:23 PM 05/01/2021    3:46 PM  Depression screen PHQ 2/9  Decreased Interest 3 3 3  0 0 0 1  Down, Depressed, Hopeless 3 3 3  0 0 0 1  PHQ - 2 Score 6 6 6  0 0 0 2  Altered sleeping 3 3  3   0 1  Tired, decreased energy 3 3  3   0 1  Change in appetite 3 3  0  0 1  Feeling bad or failure about yourself  2 3  0     Trouble concentrating 3 3  3   0 0  Moving slowly or fidgety/restless 3 3  0  0 0  Suicidal thoughts 0 1  0  0 0  PHQ-9 Score 23 25  9   0 5  Difficult doing work/chores Very difficult Very difficult           Review of Systems  Musculoskeletal:        B/L knee and foot pain  All other systems reviewed and are negative.      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 Original admission 01/26/19- and came to CIR 03/23/19- discharged- 04/20/19.  Has visual difficulties- double vision due to stroke.  Here for f/u on stroke as well as nerve pain and DM.   Medicare doesn't cover his tramadol- so not taking at all. So hasn't been taking meds- will send to [facility]   2.  Pharmacist said will look into it and will call me back- I gave her my cell phone- so we can determine why meds so expensive ($159) just for BP, DM and HLD meds- and  see if can vastly reduce the cost.   3. Called [facility] to try and get pt meds that I write for- and they are checking to see if can get for him.    4. Asked pt/pharmacist- to get Diabetes, blood pressure and cholesterol meds sent to [facility].   5. Sent secure chat to PCP to have her send meds there in future.    6. All three medicines Celexa, Lyrica and Tramadol together $8- total.   7. We discussed that increased tramadol- and can take up to 3x/day-   8. F/U in 3 months -double appt-   9. Last labs- Tox UDS was 05/10/22- looked good   10. [facility] had no insurance so charged him a Psychologist, occupational, but insurance prices-   11. Call 1800- medicare-[phone] And they should help you sign up for medicare D- look at advantage plan   I spent a total of 46   minutes on total care today- >50% coordination of care- due to multiple phone calls in room with pt to [facility], to [facility] to cover meds, since being charged too much!!!! Couldn't afford meds I prescribed last time.

## 2022-08-16 ENCOUNTER — Other Ambulatory Visit: Payer: Self-pay

## 2022-09-02 DIAGNOSIS — R490 Dysphonia: Secondary | ICD-10-CM | POA: Diagnosis not present

## 2022-09-12 ENCOUNTER — Ambulatory Visit (INDEPENDENT_AMBULATORY_CARE_PROVIDER_SITE_OTHER): Payer: Medicare Other | Admitting: Neurology

## 2022-09-12 ENCOUNTER — Encounter: Payer: Self-pay | Admitting: Neurology

## 2022-09-12 VITALS — BP 165/103 | HR 80 | Ht 67.0 in | Wt 193.0 lb

## 2022-09-12 DIAGNOSIS — Z7984 Long term (current) use of oral hypoglycemic drugs: Secondary | ICD-10-CM

## 2022-09-12 DIAGNOSIS — E11621 Type 2 diabetes mellitus with foot ulcer: Secondary | ICD-10-CM

## 2022-09-12 DIAGNOSIS — L97509 Non-pressure chronic ulcer of other part of unspecified foot with unspecified severity: Secondary | ICD-10-CM

## 2022-09-12 DIAGNOSIS — I63433 Cerebral infarction due to embolism of bilateral posterior cerebral arteries: Secondary | ICD-10-CM | POA: Diagnosis not present

## 2022-09-12 DIAGNOSIS — I69359 Hemiplegia and hemiparesis following cerebral infarction affecting unspecified side: Secondary | ICD-10-CM | POA: Diagnosis not present

## 2022-09-12 MED ORDER — GLIPIZIDE 5 MG PO TABS
5.0000 mg | ORAL_TABLET | Freq: Every day | ORAL | 0 refills | Status: DC
Start: 2022-09-12 — End: 2023-05-12

## 2022-09-12 MED ORDER — LISINOPRIL 40 MG PO TABS: 40.0000 mg | ORAL_TABLET | Freq: Every day | ORAL | 0 refills | Status: DC

## 2022-09-12 MED ORDER — GABAPENTIN 100 MG PO CAPS: 100.0000 mg | ORAL_CAPSULE | Freq: Every day | ORAL | 0 refills | Status: DC

## 2022-09-12 NOTE — Progress Notes (Signed)
[facility]  PATIENT: Andrew Bruce DOB: 12-10-1965  REQUESTING CLINICIAN: Kathrynn Speed, NP HISTORY FROM: Patient via interpretor Alba  REASON FOR VISIT: Stroke follow up    HISTORICAL  CHIEF COMPLAINT:  Chief Complaint  Patient presents with   Cerebrovascular Accident    Rm12, interpreter andrea present  Cva:pt stated not feeling well trying to recover little by little since covid hospitalized for 3 months coma. 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 Bilateral leg weakness but right is worse due to 3 toe amputation 2 years ago. Right sided numbness and face. Feels fatigued and like unable to gain strength back    INTERVAL HISTORY 09/12/2022:  Patient presents today for follow-up, last visit was a year ago, since then he has been doing well, reports that his strength is improving on the right.  He did see ENT and had vocal cord injection, feels like his voice is getting better.  Denies any recent fall.  He continue to walk with a walker, again report his weakness is improving.  No other complaint or concerns.   HISTORY OF PRESENT ILLNESS:  This is a 57 year old gentleman with past medical history of multiple strokes in the setting of COVID infection, hypertension, hyperlipidemia and diabetes mellitus type 2 who is presenting with complaint of right-sided weakness and numbness, bilateral legs pain and throat pain.  Patient reports prior to his COVID infection in 2021 he was working at [employer] Friday, independent and has no deficit.  With the COVID infection, he was in the coma for 47-months, he was intubated and had a tracheostomy.  During this time, he did have MRI brain which showed multiple cardioembolic strokes involving both cerebral and cerebellar hemispheres.  Since discharge from the hospital he has completed rehab but still have lingering right-sided deficit.  He reported deficit is mainly weakness on the right and right side pain.  He is compliant with his  medication.  He is pending referral to ENT regarding his throat pain.     OTHER MEDICAL CONDITIONS: Multiple strokes, Hypertension, DMII, Hyperlipidemia    REVIEW OF SYSTEMS: Full 14 system review of systems performed and negative with exception of: as noted in the HPI   ALLERGIES: No Known Allergies  HOME MEDICATIONS: Outpatient Medications Prior to Visit  Medication Sig Dispense Refill   acetaminophen (TYLENOL) 500 MG tablet Take by mouth.     amitriptyline (ELAVIL) 25 MG tablet Take 1 tablet (25 mg total) by mouth at bedtime. 30 tablet 1   amLODipine (NORVASC) 10 MG tablet Take 1 tablet (10 mg total) by mouth daily. 30 tablet 2   aspirin EC 81 MG tablet Take 1 tablet by mouth daily.     atorvastatin (LIPITOR) 40 MG tablet Take 1 tablet (40 mg total) by mouth daily. 90 tablet 3   citalopram (CELEXA) 20 MG tablet Take 1 tablet (20 mg total) by mouth daily. 90 tablet 1   citalopram (CELEXA) 20 MG tablet Take 1 tablet (20 mg total) by mouth daily. For mood 30 tablet 5   ibuprofen (ADVIL) 800 MG tablet Take by mouth.     metFORMIN (GLUCOPHAGE) 500 MG tablet Take 1 tablet (500 mg total) by mouth 2 (two) times daily with a meal. 60 tablet 2   metoprolol tartrate (LOPRESSOR) 25 MG tablet Take 1 tablet (25 mg total) by mouth 2 (two) times daily. For blood pressure 180 tablet 3   traMADol (ULTRAM) 50 MG tablet Take 2 tablets (100 mg total) by mouth in the  morning, at noon, and at bedtime. For chronic pain 180 tablet 5   glipiZIDE (GLUCOTROL) 5 MG tablet Take 1 tablet (5 mg total) by mouth daily before breakfast. 30 tablet 2   lisinopril (ZESTRIL) 40 MG tablet Take 1 tablet (40 mg total) by mouth daily. 30 tablet 2   pregabalin (LYRICA) 150 MG capsule Take 1 capsule (150 mg total) by mouth 3 (three) times daily. - for nerve pain 90 capsule 5   No facility-administered medications prior to visit.    PAST MEDICAL HISTORY: Past Medical History:  Diagnosis Date   COVID-19 02/06/2019   Diabetes  mellitus without complication (HCC)    Hypertension    PE (pulmonary thromboembolism) (HCC)    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   Pneumonia    with Covid   Stroke (HCC)    weakness on right side, occurred while he had Covid    PAST SURGICAL HISTORY: Past Surgical History:  Procedure Laterality Date   AMPUTATION Right 08/13/2019   Procedure: AMPUTATION RIGHT FIRST AND SECOND TOES;  Surgeon: Larina Earthly, MD;  Location: [facility] OR;  Service: Vascular;  Laterality: Right;   DIRECT LARYNGOSCOPY Bilateral 04/06/2019   Procedure: MICRO DIRECT LARYNGOSCOPY WITH PROLARYN INJECTION;  Surgeon: Christia Reading, MD;  Location: [facility] OR;  Service: ENT;  Laterality: Bilateral;   IR GASTROSTOMY TUBE MOD SED  03/10/2019   IR GASTROSTOMY TUBE REMOVAL  04/20/2019   WOUND DEBRIDEMENT Left 08/13/2019   Procedure: AMPUTATION  OF LEFT FIRST TOE, AND DEBRIDEMENT OF LEFT SECOND, THIRD AND FOURTH TOES;  Surgeon: Larina Earthly, MD;  Location: [facility] OR;  Service: Vascular;  Laterality: Left;    FAMILY HISTORY: Family History  Problem Relation Age of Onset   Healthy Sister    Healthy Brother     SOCIAL HISTORY: Social History   Socioeconomic History   Marital status: Married    Spouse name: Not on file   Number of children: 8   Years of education: Not on file   Highest education level: 6th grade  Occupational History   Not on file  Tobacco Use   Smoking status: Former   Smokeless tobacco: Never  Vaping Use   Vaping status: Never Used  Substance and Sexual Activity   Alcohol use: Not Currently    Comment: rare to occasional   Drug use: Never   Sexual activity: Yes    Birth control/protection: None  Other Topics Concern   Not on file  Social History Narrative   Not on file   Social Determinants of Health   Financial Resource Strain: High Risk (09/21/2021)   [facility], [facility]   Overall Financial Resource Strain (CARDIA)    Difficulty of Paying Living  Expenses: Very hard  Food Insecurity: Food Insecurity Present (09/21/2021)   [facility], [facility]   Hunger Vital Sign    Worried About Running Out of Food in the Last Year: Often true    Ran Out of Food in the Last Year: Often true  Transportation Needs: No Transportation Needs (09/21/2021)   Received from [facility], [facility]   PRAPARE - Transportation    Lack of Transportation (Medical): No    Lack of Transportation (Non-Medical): No  Physical Activity: Not on file  Stress: Not on file  Social Connections: Not on file  Intimate Partner Violence: Not on file    PHYSICAL EXAM  GENERAL  EXAM/CONSTITUTIONAL: Vitals:  Vitals:   09/12/22 1536 09/12/22 1543  BP: (!) 172/89 (!) 165/103  Pulse: 80   Weight: 193 lb (87.5 kg)   Height: 5\' 7"  (1.702 m)    Body mass index is 30.23 kg/m. Wt Readings from Last 3 Encounters:  09/12/22 193 lb (87.5 kg)  08/09/22 196 lb (88.9 kg)  06/27/22 197 lb (89.4 kg)   Patient is in no distress; well developed, nourished and groomed; neck is supple  MUSCULOSKELETAL: Gait, strength, tone, movements noted in Neurologic exam below  NEUROLOGIC: MENTAL STATUS:      No data to display         awake, alert, oriented to person, place and time recent and remote memory intact normal attention and concentration language fluent, comprehension intact, naming intact fund of knowledge appropriate  CRANIAL NERVE:  2nd, 3rd, 4th, 6th - He has a right visual field cut, extraocular muscles intact, no nystagmus 5th - facial sensation symmetric 7th - facial strength symmetric 8th - hearing intact 9th - palate elevates symmetrically, uvula midline 11th - shoulder shrug symmetric 12th - tongue protrusion midline  MOTOR:  normal bulk and tone, full strength in the LUE, LLE. RUE and LLE 4+/5 strenght in all muscle groups, including finger intrinsics; sometimes he will have give away weakness due to pain  SENSORY:   Decrease sensation to light touch in the right upper and lower extremities   COORDINATION:  Slow finger-nose-finger, fine finger movements normal  REFLEXES:  deep tendon reflexes present and symmetric  GAIT/STATION:  Mild hemiplegic gait, walks with a cane    DIAGNOSTIC DATA (LABS, IMAGING, TESTING) - I reviewed patient records, labs, notes, testing and imaging myself where available.  Lab Results  Component Value Date   WBC 7.2 06/27/2022   HGB 14.2 06/27/2022   HCT 42.3 06/27/2022   MCV 86 06/27/2022   PLT 299 06/27/2022      Component Value Date/Time   NA 141 06/27/2022 1533   K 4.1 06/27/2022 1533   CL 103 06/27/2022 1533   CO2 19 (L) 06/27/2022 1533   GLUCOSE 205 (H) 06/27/2022 1533   GLUCOSE 119 (H) 08/13/2019 0844   BUN 15 06/27/2022 1533   CREATININE 0.81 06/27/2022 1533   CALCIUM 9.2 06/27/2022 1533   PROT 6.9 06/27/2022 1533   ALBUMIN 4.5 06/27/2022 1533   AST 26 06/27/2022 1533   ALT 37 06/27/2022 1533   ALKPHOS 83 06/27/2022 1533   BILITOT 0.2 06/27/2022 1533   GFRNONAA 104 03/06/2020 1414   GFRAA 120 03/06/2020 1414   Lab Results  Component Value Date   CHOL 239 (H) 06/27/2022   HDL 48 06/27/2022   LDLCALC 142 (H) 06/27/2022   LDLDIRECT 73.0 02/18/2019   TRIG 272 (H) 06/27/2022   CHOLHDL 5.0 06/27/2022   Lab Results  Component Value Date   HGBA1C 7.2 (A) 06/27/2022   Lab Results  Component Value Date   VITAMINB12 759 03/24/2019   Lab Results  Component Value Date   TSH 4.156 03/21/2019    MRI Brain 03/19/2019 1. No acute intracranial abnormality identified. 2. Subacute infarcts scattered throughout the brain parenchyma including bilateral frontoparietal regions, splenium of corpus callosum on the right side and most significantly left occipital lobe. 3. Chronic bilateral cerebellar infarcts.    ASSESSMENT AND PLAN  57 y.o. year old male with history of hypertension, hyperlipidemia, diabetes mellitus, COVID infection in 2021  complicated by multiple cardioembolic strokes.  Patient stroke etiology likely hypercoagulable state due to  COVID 19.  He did completed physical therapy but still have mild right-sided deficit but continue to improve.  Plan will be for patient to continue current medications, continue exercising at home, continue to follow with his doctors and I will see him in 1 year for follow-up or sooner if worse.  He voiced understanding.    1. Cerebrovascular accident (CVA) due to bilateral embolism of posterior cerebral arteries (HCC)   2. Type 2 diabetes, controlled, with ulcer of toe (HCC)   3. Cardioembolic stroke, old, with hemiparesis (HCC)      Patient Instructions  Continue current medications Continue to follow with PCP Continue with physical therapy Follow-up in 1 year or sooner if worse.  No orders of the defined types were placed in this encounter.   Meds ordered this encounter  Medications   lisinopril (ZESTRIL) 40 MG tablet    Sig: Take 1 tablet (40 mg total) by mouth daily.    Dispense:  30 tablet    Refill:  0   glipiZIDE (GLUCOTROL) 5 MG tablet    Sig: Take 1 tablet (5 mg total) by mouth daily before breakfast.    Dispense:  30 tablet    Refill:  0   gabapentin (NEURONTIN) 100 MG capsule    Sig: Take 1 capsule (100 mg total) by mouth at bedtime.    Dispense:  30 capsule    Refill:  0    Return in about 1 year (around 09/12/2023).   [name], MD 09/12/2022, 4:36 PM  [facility] [address], [address] [place], [place] [address] [phone]

## 2022-09-12 NOTE — Patient Instructions (Signed)
Continue current medications Continue to follow with PCP Continue with physical therapy Follow-up in 1 year or sooner if worse.

## 2022-09-27 ENCOUNTER — Encounter: Payer: Self-pay | Admitting: Nurse Practitioner

## 2022-09-27 ENCOUNTER — Ambulatory Visit: Payer: Medicare Other | Admitting: Nurse Practitioner

## 2022-09-27 VITALS — BP 136/63 | HR 83 | Ht 67.0 in | Wt 192.0 lb

## 2022-09-27 DIAGNOSIS — E1165 Type 2 diabetes mellitus with hyperglycemia: Secondary | ICD-10-CM

## 2022-09-27 DIAGNOSIS — Z23 Encounter for immunization: Secondary | ICD-10-CM

## 2022-09-27 LAB — POCT GLYCOSYLATED HEMOGLOBIN (HGB A1C): Hemoglobin A1C: 8 % — AB (ref 4.0–5.6)

## 2022-09-27 MED ORDER — METFORMIN HCL ER 500 MG PO TB24
500.0000 mg | ORAL_TABLET | Freq: Every day | ORAL | 2 refills | Status: DC
Start: 2022-09-27 — End: 2022-10-04

## 2022-09-27 NOTE — Patient Instructions (Addendum)
1. Need for influenza vaccination  - Flu vaccine trivalent PF, 6mos and older(Flulaval,Afluria,Fluarix,Fluzone)  2. Uncontrolled type 2 diabetes mellitus with hyperglycemia (HCC)  - metFORMIN (GLUCOPHAGE-XR) 500 MG 24 hr tablet; Take 1 tablet (500 mg total) by mouth daily with breakfast.  Dispense: 60 tablet; Refill: 2  Follow up:  Follow up in 3 months

## 2022-09-27 NOTE — Progress Notes (Signed)
Subjective   Patient ID: Andrew Bruce, male    DOB: 1965/05/25, 57 y.o.   MRN: [id number]  Chief Complaint  Patient presents with   Follow-up         Referring provider: [name], NP  Andrew Bruce is a 57 y.o. male with Past Medical History: 02/06/2019: COVID-19 No date: Diabetes mellitus without complication (HCC) No date: Hypertension No date: PE (pulmonary thromboembolism) (HCC)     Comment:  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 in setting of COVID               ARDS No date: Pneumonia     Comment:  with Covid No date: Stroke [facility])     Comment:  weakness on right side, occurred while he had Covid   HPI  Diabetes:   Patient presents for diabetic follow up. Currently taking metformin and glipizide. Complaint with diabetic diet. Taking medications. A1C today is 8.0. A1C was 8.6 at last visit. We have placed a referral for diabetic medication management with pharmacist.    Hypertension:   Taking medications as directed. Checking blood pressures at home - within normal range. No issues or concerns.     Denies f/c/s, n/v/d, hemoptysis, PND, leg swelling Denies chest pain or edema    No Known Allergies  Immunization History  Administered Date(s) Administered   Influenza, Seasonal, Injecte, Preservative Fre 09/27/2022   Influenza,inj,Quad PF,6+ Mos 03/25/2019, 11/17/2020   Pneumococcal Polysaccharide-23 03/25/2019, 11/17/2020    Tobacco History: Social History   Tobacco Use  Smoking Status Former  Smokeless Tobacco Never   Counseling given: Not Answered   Outpatient Encounter Medications as of 09/27/2022  Medication Sig   acetaminophen (TYLENOL) 500 MG tablet Take by mouth.   amitriptyline (ELAVIL) 25 MG tablet Take 1 tablet (25 mg total) by mouth at bedtime.   aspirin EC 81 MG tablet Take 1 tablet by mouth daily.   citalopram (CELEXA) 20 MG tablet Take 1 tablet (20 mg total) by mouth daily.   citalopram (CELEXA) 20 MG tablet Take 1 tablet (20  mg total) by mouth daily. For mood   glipiZIDE (GLUCOTROL) 5 MG tablet Take 1 tablet (5 mg total) by mouth daily before breakfast.   ibuprofen (ADVIL) 800 MG tablet Take by mouth.   metoprolol tartrate (LOPRESSOR) 25 MG tablet Take 1 tablet (25 mg total) by mouth 2 (two) times daily. For blood pressure   traMADol (ULTRAM) 50 MG tablet Take 2 tablets (100 mg total) by mouth in the morning, at noon, and at bedtime. For chronic pain   [DISCONTINUED] amLODipine (NORVASC) 10 MG tablet Take 1 tablet (10 mg total) by mouth daily.   [DISCONTINUED] atorvastatin (LIPITOR) 40 MG tablet Take 1 tablet (40 mg total) by mouth daily.   [DISCONTINUED] gabapentin (NEURONTIN) 100 MG capsule Take 1 capsule (100 mg total) by mouth at bedtime.   [DISCONTINUED] lisinopril (ZESTRIL) 40 MG tablet Take 1 tablet (40 mg total) by mouth daily.   [DISCONTINUED] metFORMIN (GLUCOPHAGE) 500 MG tablet Take 1 tablet (500 mg total) by mouth 2 (two) times daily with a meal.   [DISCONTINUED] metFORMIN (GLUCOPHAGE-XR) 500 MG 24 hr tablet Take 1 tablet (500 mg total) by mouth daily with breakfast.   No facility-administered encounter medications on file as of 09/27/2022.    Review of Systems  Review of Systems  Constitutional: Negative.   HENT: Negative.    Cardiovascular: Negative.   Gastrointestinal: Negative.   Allergic/Immunologic: Negative.   Neurological: Negative.  Psychiatric/Behavioral: Negative.       Objective:   BP 136/63 (BP Location: Right Arm, Patient Position: Sitting, Cuff Size: Normal)   Pulse 83   Ht 5\' 7"  (1.702 m)   Wt 192 lb (87.1 kg)   SpO2 94%   BMI 30.07 kg/m   Wt Readings from Last 5 Encounters:  10/11/22 193 lb (87.5 kg)  09/27/22 192 lb (87.1 kg)  09/12/22 193 lb (87.5 kg)  08/09/22 196 lb (88.9 kg)  06/27/22 197 lb (89.4 kg)     Physical Exam Vitals and nursing note reviewed.  Constitutional:      General: He is not in acute distress.    Appearance: He is well-developed.   Cardiovascular:     Rate and Rhythm: Normal rate and regular rhythm.  Pulmonary:     Effort: Pulmonary effort is normal.     Breath sounds: Normal breath sounds.  Skin:    General: Skin is warm and dry.  Neurological:     Mental Status: He is alert and oriented to person, place, and time.     Last hemoglobin A1c Lab Results  Component Value Date   HGBA1C 8.0 (A) 09/27/2022      Assessment & Plan:   Need for influenza vaccination -     Flu vaccine trivalent PF, 6mos and older(Flulaval,Afluria,Fluarix,Fluzone)  Uncontrolled type 2 diabetes mellitus with hyperglycemia (HCC) -     POCT glycosylated hemoglobin (Hb A1C) -     AMB Referral to Pharmacy Medication Management     Return in about 3 months (around 12/27/2022).   [name], NP 10/11/2022

## 2022-10-01 ENCOUNTER — Telehealth: Payer: Self-pay

## 2022-10-01 NOTE — Progress Notes (Signed)
Care Guide Note  10/01/2022 Name: [name] MRN: [id number] DOB: 12-Dec-1965  Referred by: [name], NP Reason for referral : Care Coordination (Outreach to schedule with Pharm d )   Andrew Bruce is a 57 y.o. year old male who is a primary care patient of [name], NP. [name] was referred to the pharmacist for assistance related to HTN and DM.    Successful contact was made with the patient to discuss pharmacy services including being ready for the pharmacist to call at least 5 minutes before the scheduled appointment time, to have medication bottles and any blood sugar or blood pressure readings ready for review. The patient agreed to meet with the pharmacist via with the pharmacist via telephone visit on (date/time).  10/02/2022  Penne Lash, RMA Care Guide [facility]  [place], [place] [address] Direct Dial: [phone] [email or web address]

## 2022-10-01 NOTE — Progress Notes (Signed)
Care Guide Note  10/01/2022 Name: Andrew Bruce MRN: [id number] DOB: 12-Dec-1965  Referred by: [name], NP Reason for referral : Care Coordination (Outreach to schedule with Pharm d )   Andrew Bruce is a 57 y.o. year old male who is a primary care patient of [name], NP. Andrew Bruce was referred to the pharmacist for assistance related to HTN and DM.    An unsuccessful telephone outreach was attempted today to contact the patient who was referred to the pharmacy team for assistance with medication assistance. Additional attempts will be made to contact the patient.   Penne Lash, RMA Care Guide [facility]  [place], [place] [address] Direct Dial: [phone] [email or web address]

## 2022-10-02 ENCOUNTER — Other Ambulatory Visit: Payer: Medicare Other | Admitting: Pharmacist

## 2022-10-02 DIAGNOSIS — E11621 Type 2 diabetes mellitus with foot ulcer: Secondary | ICD-10-CM

## 2022-10-02 MED ORDER — ACCU-CHEK GUIDE W/DEVICE KIT
PACK | 0 refills | Status: DC
Start: 2022-10-02 — End: 2022-10-04

## 2022-10-02 MED ORDER — ACCU-CHEK GUIDE VI STRP
ORAL_STRIP | 12 refills | Status: DC
Start: 2022-10-02 — End: 2022-10-04

## 2022-10-02 MED ORDER — ACCU-CHEK SOFTCLIX LANCETS MISC
12 refills | Status: AC
Start: 2022-10-02 — End: ?

## 2022-10-02 NOTE — Progress Notes (Signed)
10/02/2022 Name: Andrew Bruce MRN: [id number] DOB: 07-27-1965  Chief Complaint  Patient presents with   Diabetes   Hypertension   Hyperlipidemia    Andrew Bruce is a 57 y.o. year old male who presented for a telephone visit.   They were referred to the pharmacist by their PCP for assistance in managing diabetes, hypertension, and hyperlipidemia.    Subjective:  Care Team: Primary Care Provider: [name], NP ; Next Scheduled Visit: 12/27/2022 Neurologist: [name], NP; Next Scheduled Visit: 09/17/2023  Medication Access/Adherence  Current Pharmacy:  [facility] [address] [place] - [address] [place] [address] Phone: [phone] Fax: [phone]   Patient reports affordability concerns with their medications: Yes  Patient reports access/transportation concerns to their pharmacy: No  Patient reports adherence concerns with their medications:  Yes    *Reports using a weekly pillbox, but will need medications refilled. Regular adherence calls advised.   Diabetes:  Current medications: Metformin 500mg  twice a day, Glipizide 5mg  in the morning Medications tried in the past: N/A  Current glucose readings: 128 FBG yesterday; 72 or 73 as lowest FBG Using meter; testing 1 times daily   Patient denies hypoglycemic s/sx including dizziness, shakiness, sweating. Patient denies hyperglycemic symptoms including polyuria, polydipsia, polyphagia, nocturia, neuropathy, blurred vision.  Did have an occurrence of shakiness previously, but issues have resolved. Takes medications with food in the morning as recommended.  Current meal patterns:  Breakfast/lunch: Chicken breast and salad Dinner: Whatever wife cooks Drinks: Water, 1 glass of milk  Hypertension:  Current medications: Amlodipine 10mg , Lisinopril 40mg  Medications previously tried: N/A  Patient has a validated, automated, upper arm home BP  cuff Current blood pressure readings readings: 120-130's/70's  Patient denies hypotensive s/sx including dizziness, lightheadedness.  Patient denies hypertensive symptoms including headache, chest pain, shortness of breath    Hyperlipidemia/ASCVD Risk Reduction  Current lipid lowering medications: Atorvastatin 40mg  Medications tried in the past: N/A  Antiplatelet regimen: Aspirin 81mg   ASCVD History: Stroke in 2021  10 year ASCVD risk of 18.3%    Objective:  Lab Results  Component Value Date   HGBA1C 8.0 (A) 09/27/2022    Lab Results  Component Value Date   CREATININE 0.81 06/27/2022   BUN 15 06/27/2022   NA 141 06/27/2022   K 4.1 06/27/2022   CL 103 06/27/2022   CO2 19 (L) 06/27/2022    Lab Results  Component Value Date   CHOL 239 (H) 06/27/2022   HDL 48 06/27/2022   LDLCALC 142 (H) 06/27/2022   LDLDIRECT 73.0 02/18/2019   TRIG 272 (H) 06/27/2022   CHOLHDL 5.0 06/27/2022    Medications Reviewed Today     Reviewed by Pollie Friar, RPH (Pharmacist) on 10/02/22 at 1520  Med List Status: <None>   Medication Order Taking? Sig Documenting Provider Last Dose Status Informant  acetaminophen (TYLENOL) 500 MG tablet [id number] Yes Take by mouth. [provider] Taking Active            Med Note [name]   Wed Mar 27, 2022  3:20 PM) Prn    amitriptyline (ELAVIL) 25 MG tablet [id number] No Take 1 tablet (25 mg total) by mouth at bedtime.  Patient not taking: Reported on 10/02/2022   [name], DO Not Taking Active   amLODipine (NORVASC) 10 MG tablet [id number] Yes Take 1 tablet (10 mg total) by mouth daily. [name], NP Taking Active   aspirin EC 81 MG  tablet [id number] Yes Take 1 tablet by mouth daily. [provider] Taking Active   atorvastatin (LIPITOR) 40 MG tablet [id number] Yes Take 1 tablet (40 mg total) by mouth daily. [name], NP Taking Active   citalopram (CELEXA) 20 MG tablet [id number] Yes Take 1 tablet (20 mg  total) by mouth daily. [name], [name], MD Taking Active   citalopram (CELEXA) 20 MG tablet [id number] Yes Take 1 tablet (20 mg total) by mouth daily. For mood [name], [name], MD Taking Active   gabapentin (NEURONTIN) 100 MG capsule [id number] Yes Take 1 capsule (100 mg total) by mouth at bedtime. [name], MD Taking Active   glipiZIDE (GLUCOTROL) 5 MG tablet [id number] Yes Take 1 tablet (5 mg total) by mouth daily before breakfast. [name], MD Taking Active   ibuprofen (ADVIL) 800 MG tablet [id number] Yes Take by mouth. [provider] Taking Active            Med Note [name], CHERYL A   Tue Apr 09, 2022  2:43 PM) As needed  lisinopril (ZESTRIL) 40 MG tablet [id number] Yes Take 1 tablet (40 mg total) by mouth daily. [name], MD Taking Active   metFORMIN (GLUCOPHAGE-XR) 500 MG 24 hr tablet [id number] Yes Take 1 tablet (500 mg total) by mouth daily with breakfast. [name], NP Taking Active            Med Note Lorenso Courier, Garima Chronis M   Wed Oct 02, 2022  3:06 PM) 1 in AM + 1 in PM  metoprolol tartrate (LOPRESSOR) 25 MG tablet [id number] No Take 1 tablet (25 mg total) by mouth 2 (two) times daily. For blood pressure  Patient not taking: Reported on 10/02/2022   [name], NP Not Taking Active   traMADol (ULTRAM) 50 MG tablet [id number] Yes Take 2 tablets (100 mg total) by mouth in the morning, at noon, and at bedtime. For chronic pain [name], [name], MD Taking Active               Assessment/Plan:   Diabetes: - Currently uncontrolled - Reviewed long term cardiovascular and renal outcomes of uncontrolled blood sugar - Reviewed goal A1c, goal fasting, and goal 2 hour post prandial glucose - Reviewed lifestyle modifications including: - Recommend to check glucose daily   Hypertension: - Currently controlled - Reviewed long term cardiovascular and renal outcomes of uncontrolled blood pressure - Reviewed appropriate blood pressure monitoring technique and  reviewed goal blood pressure. Recommended to check home blood pressure and heart rate daily    Hyperlipidemia/ASCVD Risk Reduction: - Currently uncontrolled.  - Reviewed long term complications of uncontrolled cholesterol   **Follow Up Plan:**  - Call patient on Friday at 3PM- ask about permission to switch medications to [facility] and adherence packaging - Emphasized importance of taking medications as prescribed  - Need to ask [facility] for pricing on medications for cost review - Submit Rx for cheapest glucometer at [facility] [employer] [facility] currently - Get lipid panel and A1c again in December 2024   Summary of [facility] Med Review Info: - Amlodipine 10mg - [facility] $5 - Aspirin 81mg - $8 for 4 months at [facility] - Atorvastatin 40mg - [facility] $10 - Citalopram 20mg - [facility] free or [facility] $5 - Gabapentin 100mg - [facility] $5 for #30 - Glipizide 5mg - [facility] $5, but XL is $5 at [facility] - Lisinopril 40mg - [facility] $5 - Metformin 500mg - [facility] $4 and [facility] $5 - Tramadol 50mg - $16.51 at [facility] for 1 mo **Switch meds to [facility] to be $  40 + $16.51 for Tramadol + OTC aspirin price every 4 mo - Ask about adherence packaging- would be missing Lisinopril (9/27), Glipizide (9/27), Atorvastatin (10/16), Citalopram (10/24), Gabapentin (9/28) - Meds due: Amlodipine 10mg , Metformin 500mg , Tramadol 50mg  - [facility] already has Tramadol and Citalopram- rest need transferred; verify if Gabapentin continued by Neurology   Total time for visits: 51 minutes + 45 minutes completing med cost review  [name], PharmD [facility] Phone Number: [phone]

## 2022-10-04 ENCOUNTER — Telehealth: Payer: Self-pay | Admitting: Pharmacist

## 2022-10-04 DIAGNOSIS — E1165 Type 2 diabetes mellitus with hyperglycemia: Secondary | ICD-10-CM

## 2022-10-04 DIAGNOSIS — I63119 Cerebral infarction due to embolism of unspecified vertebral artery: Secondary | ICD-10-CM

## 2022-10-04 DIAGNOSIS — I10 Essential (primary) hypertension: Secondary | ICD-10-CM

## 2022-10-04 NOTE — Progress Notes (Unsigned)
10/04/2022  Patient ID: Andrew Bruce, male   DOB: 1965-09-07, 57 y.o.   MRN: [id number]  Spoke with the patient on the phone regarding outcome of medication cost review. Agreed to switch to adherence packaging with [facility] pharmacy mail order and aware of pricing.   Also discussed if he needs a new BG meter, I would recommend getting Relio Premier for $9 at [facility] [facility]. Can ask pharmacist for assistance finding if needed.  *Of note, reports he took Glipizide twice a day to help with higher BG readings over past few days- only has 2 tablets left. Also took Lisinopril twice a day to help with higher BP readings over past week- only has 4 tablets left (due to being out of Amlodipine for past 2 weeks).  Will be getting 1 month of Amlodipine 10mg  from the pharmacy on Monday. Also, need Gabapentin refill from Neurology as he is "feeling better already" with it.   Aware to watch out for call from pharmacy next week to set-up shipment.   Total length of visit: 30 minutes   Andrew Bruce, PharmD [facility] Phone Number: [phone]

## 2022-10-07 ENCOUNTER — Other Ambulatory Visit: Payer: Self-pay

## 2022-10-07 ENCOUNTER — Other Ambulatory Visit (HOSPITAL_COMMUNITY): Payer: Self-pay

## 2022-10-07 MED ORDER — LISINOPRIL 40 MG PO TABS
40.0000 mg | ORAL_TABLET | Freq: Every day | ORAL | 5 refills | Status: DC
  Filled 2022-10-07: qty 90, 90d supply, fill #0
  Filled 2022-10-09: qty 30, 30d supply, fill #0
  Filled 2022-11-04: qty 30, 30d supply, fill #1
  Filled 2022-11-27: qty 30, 30d supply, fill #2
  Filled 2022-12-26: qty 30, 30d supply, fill #3
  Filled 2023-01-17 – 2023-02-13 (×5): qty 30, 30d supply, fill #4
  Filled 2023-03-13: qty 30, 30d supply, fill #5
  Filled ????-??-??: fill #4
  Filled ????-??-??: fill #3
  Filled ????-??-??: fill #4

## 2022-10-07 MED ORDER — ATORVASTATIN CALCIUM 40 MG PO TABS
40.0000 mg | ORAL_TABLET | Freq: Every day | ORAL | 3 refills | Status: DC
  Filled 2022-10-07 (×2): qty 90, 90d supply, fill #0
  Filled 2022-12-26: qty 30, 30d supply, fill #1
  Filled 2023-01-17 – 2023-02-13 (×5): qty 30, 30d supply, fill #2
  Filled 2023-03-13: qty 30, 30d supply, fill #3
  Filled 2023-04-16 (×2): qty 30, 30d supply, fill #4
  Filled 2023-05-16: qty 30, 30d supply, fill #5
  Filled 2023-06-16: qty 30, 30d supply, fill #6
  Filled 2023-07-21: qty 30, 30d supply, fill #7
  Filled 2023-08-22: qty 22, 22d supply, fill #8
  Filled 2023-08-22: qty 30, 30d supply, fill #8
  Filled ????-??-?? (×2): fill #2
  Filled ????-??-??: fill #1

## 2022-10-07 MED ORDER — GABAPENTIN 100 MG PO CAPS
100.0000 mg | ORAL_CAPSULE | Freq: Every day | ORAL | 2 refills | Status: DC
  Filled 2022-10-07 – 2022-10-09 (×3): qty 30, 30d supply, fill #0
  Filled 2022-11-04: qty 30, 30d supply, fill #1
  Filled 2022-11-27: qty 30, 30d supply, fill #2

## 2022-10-07 MED ORDER — METFORMIN HCL ER 500 MG PO TB24
1000.0000 mg | ORAL_TABLET | Freq: Two times a day (BID) | ORAL | 2 refills | Status: DC
  Filled 2022-10-07 (×3): qty 360, 90d supply, fill #0
  Filled 2022-10-09: qty 120, 30d supply, fill #0
  Filled 2022-11-04: qty 120, 30d supply, fill #1
  Filled 2022-11-27: qty 120, 30d supply, fill #2
  Filled 2022-12-26: qty 120, 30d supply, fill #3
  Filled 2023-01-17 – 2023-02-13 (×5): qty 120, 30d supply, fill #4
  Filled 2023-03-13: qty 120, 30d supply, fill #5
  Filled 2023-04-16 (×2): qty 120, 30d supply, fill #6
  Filled 2023-05-16: qty 120, 30d supply, fill #7
  Filled 2023-06-16: qty 120, 30d supply, fill #8
  Filled ????-??-??: fill #3
  Filled ????-??-?? (×2): fill #4

## 2022-10-07 MED ORDER — AMLODIPINE BESYLATE 10 MG PO TABS
10.0000 mg | ORAL_TABLET | Freq: Every day | ORAL | 5 refills | Status: DC
Start: 2022-10-07 — End: 2023-04-16
  Filled 2022-10-07: qty 90, 90d supply, fill #0
  Filled 2022-10-09: qty 30, 30d supply, fill #0
  Filled 2022-11-04: qty 30, 30d supply, fill #1
  Filled 2022-11-27: qty 30, 30d supply, fill #2
  Filled 2022-12-26: qty 30, 30d supply, fill #3
  Filled 2023-01-17 – 2023-02-04 (×2): qty 30, 30d supply, fill #4
  Filled 2023-02-07: qty 28, 28d supply, fill #4
  Filled 2023-02-12 – 2023-02-13 (×2): qty 30, 30d supply, fill #4
  Filled 2023-03-13: qty 30, 30d supply, fill #5
  Filled ????-??-??: fill #4
  Filled ????-??-??: fill #3
  Filled ????-??-??: fill #4

## 2022-10-07 NOTE — Addendum Note (Signed)
Addended by: Pollie Friar on: 10/07/2022 11:57 AM   Modules accepted: Orders

## 2022-10-08 ENCOUNTER — Other Ambulatory Visit (HOSPITAL_COMMUNITY): Payer: Self-pay

## 2022-10-09 ENCOUNTER — Other Ambulatory Visit: Payer: Self-pay

## 2022-10-10 ENCOUNTER — Other Ambulatory Visit: Payer: Self-pay

## 2022-10-11 ENCOUNTER — Other Ambulatory Visit: Payer: Self-pay

## 2022-10-11 ENCOUNTER — Ambulatory Visit (HOSPITAL_COMMUNITY)
Admission: EM | Admit: 2022-10-11 | Discharge: 2022-10-11 | Disposition: A | Discharge status: Home / Self Care | Payer: Medicare Other | Attending: Emergency Medicine | Admitting: Emergency Medicine

## 2022-10-11 ENCOUNTER — Telehealth: Payer: Self-pay | Admitting: Pharmacist

## 2022-10-11 ENCOUNTER — Encounter (HOSPITAL_COMMUNITY): Payer: Self-pay

## 2022-10-11 DIAGNOSIS — B029 Zoster without complications: Secondary | ICD-10-CM | POA: Diagnosis not present

## 2022-10-11 MED ORDER — VALACYCLOVIR HCL 1 G PO TABS
1000.0000 mg | ORAL_TABLET | Freq: Three times a day (TID) | ORAL | 0 refills | Status: AC
  Filled 2022-10-11: qty 21, 7d supply, fill #0

## 2022-10-11 NOTE — ED Triage Notes (Signed)
Patient here today with c/o rash on the right side of his chest that wraps around to his back X 3 days.

## 2022-10-11 NOTE — ED Provider Notes (Signed)
[facility]    CSN: [id number] Arrival date & time: 10/11/22  1240      History   Chief Complaint Chief Complaint  Patient presents with   Rash    HPI Andrew Bruce is a 57 y.o. male.   Patient presents to clinic over a painful rash that is present to his right pectoral area and right thoracic area.  Area is blistered and painful.  Rash has been present for the past 3 days.  Initially patient started with right thoracic back pain and it progressed to the rash.  He has taken aspirin and Advil without much relief in his pain.  Denies any shortness of breath, trauma or injury.  Denies history of chickenpox.     The history is provided by the patient and medical records. The history is limited by a language barrier. A language interpreter was used.  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   Pneumonia    with Covid   Stroke [employer] [facility] [employer])    weakness on right side, occurred while he had Covid    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 07/14/2020   Uncontrolled hypertension 02/18/2020   Impaired functional mobility, balance, gait, and endurance 08/23/2019   Amputation of great toe (HCC) 08/23/2019   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   Impaired ambulation 06/11/2019   Dependent on walker for ambulation 04/30/2019   Right spastic  hemiparesis (HCC) 04/30/2019   Tachycardia 04/30/2019   Dependence on cane 04/30/2019   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   Acute blood loss anemia    Uncontrolled type 2 diabetes mellitus with hyperglycemia (HCC)    Dry gangrene (HCC)    Leukocytosis    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   Status post tracheostomy (HCC)    Pressure injury of skin 03/02/2019   On mechanically assisted ventilation (HCC)    Goals of care, counseling/discussion    Palliative care encounter    ARDS (adult respiratory distress syndrome) (HCC)    Cerebral embolism with cerebral infarction 02/17/2019   Acute respiratory failure with hypoxia (HCC)    Pneumothorax, right     Past Surgical History:  Procedure Laterality Date   AMPUTATION Right 08/13/2019   Procedure: AMPUTATION RIGHT FIRST AND SECOND TOES;  Surgeon: Larina Earthly, MD;  Location: [facility] OR;  Service: Vascular;  Laterality: Right;   DIRECT LARYNGOSCOPY Bilateral 04/06/2019   Procedure: MICRO DIRECT LARYNGOSCOPY WITH PROLARYN INJECTION;  Surgeon: Christia Reading, MD;  Location: [facility] OR;  Service: ENT;  Laterality: Bilateral;   IR GASTROSTOMY TUBE MOD SED  03/10/2019   IR GASTROSTOMY TUBE REMOVAL  04/20/2019   WOUND DEBRIDEMENT Left 08/13/2019   Procedure: AMPUTATION  OF LEFT FIRST TOE, AND DEBRIDEMENT OF LEFT SECOND, THIRD AND FOURTH TOES;  Surgeon: Larina Earthly, MD;  Location:  [facility] OR;  Service: Vascular;  Laterality: Left;       Home Medications    Prior to Admission medications   Medication Sig Start Date End Date Taking? Authorizing Provider  valACYclovir (VALTREX) 1000 MG tablet Take 1 tablet (1,000 mg total) by mouth 3 (three) times daily for 7 days. 10/11/22 10/18/22 Yes Rinaldo Ratel, [place] N, FNP  Accu-Chek Softclix Lancets lancets Use as instructed to check blood sugar daily. Dx E11.65 10/02/22   [name], NP  acetaminophen (TYLENOL) 500 MG tablet Take by mouth. 07/20/21   [provider]   amitriptyline (ELAVIL) 25 MG tablet Take 1 tablet (25 mg total) by mouth at bedtime. 09/28/21   [name], DO  amLODipine (NORVASC) 10 MG tablet Take 1 tablet (10 mg total) by mouth daily. 10/07/22   [name], NP  aspirin EC 81 MG tablet Take 1 tablet by mouth daily.    [provider]  atorvastatin (LIPITOR) 40 MG tablet Take 1 tablet (40 mg total) by mouth daily. 10/07/22   [name], NP  citalopram (CELEXA) 20 MG tablet Take 1 tablet (20 mg total) by mouth daily. 05/10/22   [name], [name], MD  citalopram (CELEXA) 20 MG tablet Take 1 tablet (20 mg total) by mouth daily. For mood 08/09/22   [name], [name], MD  gabapentin (NEURONTIN) 100 MG capsule Take 1 capsule (100 mg total) by mouth at bedtime. 10/07/22   [name], MD  glipiZIDE (GLUCOTROL) 5 MG tablet Take 1 tablet (5 mg total) by mouth daily before breakfast. 09/12/22   [name], MD  ibuprofen (ADVIL) 800 MG tablet Take by mouth. 07/20/21   [provider]  lisinopril (ZESTRIL) 40 MG tablet Take 1 tablet (40 mg total) by mouth daily. 10/07/22   [name], NP  metFORMIN (GLUCOPHAGE-XR) 500 MG 24 hr tablet Take 2 tablets (1,000 mg total) by mouth 2 (two) times daily with a meal. 10/07/22   [name], NP  metoprolol tartrate (LOPRESSOR) 25 MG tablet Take 1 tablet (25 mg total) by mouth 2 (two) times daily. For blood pressure 05/06/22   [name], NP  traMADol (ULTRAM) 50 MG tablet Take 2 tablets (100 mg total) by mouth in the morning, at noon, and at bedtime. For chronic pain 08/09/22   [name], [name], MD    Family History Family History  Problem Relation Age of Onset   Healthy Sister    Healthy Brother     Social History Social History   Tobacco Use   Smoking status: Former   Smokeless tobacco: Never  Advertising account planner   Vaping status: Never Used  Substance Use Topics   Alcohol use: Not Currently    Comment: rare to occasional   Drug use: Never     Allergies   Patient has no  known allergies.   Review of Systems Review of Systems  Constitutional:  Negative for fever.  Cardiovascular:  Positive for chest pain.  Musculoskeletal:  Positive for back pain.  Skin:  Positive for rash.     Physical Exam Triage Vital Signs ED Triage Vitals  Encounter Vitals Group     BP 10/11/22 1248 (!) 148/83     Systolic BP Percentile --      Diastolic BP Percentile --      Pulse Rate 10/11/22 1248 71     Resp 10/11/22 1248 16     Temp 10/11/22 1248 97.9 F (36.6 C)     Temp Source 10/11/22  1248 Oral     SpO2 10/11/22 1248 94 %     Weight 10/11/22 1247 193 lb (87.5 kg)     Height 10/11/22 1247 5\' 7"  (1.702 m)     Head Circumference --      Peak Flow --      Pain Score 10/11/22 1247 9     Pain Loc --      Pain Education --      Exclude from Growth Chart --    No data found.  Updated Vital Signs BP (!) 148/83 (BP Location: Right Arm)   Pulse 71   Temp 97.9 F (36.6 C) (Oral)   Resp 16   Ht 5\' 7"  (1.702 m)   Wt 193 lb (87.5 kg)   SpO2 94%   BMI 30.23 kg/m   Visual Acuity Right Eye Distance:   Left Eye Distance:   Bilateral Distance:    Right Eye Near:   Left Eye Near:    Bilateral Near:     Physical Exam Vitals and nursing note reviewed.  Constitutional:      Appearance: Normal appearance.  HENT:     Head: Normocephalic and atraumatic.     Right Ear: External ear normal.     Left Ear: External ear normal.     Nose: Nose normal.     Mouth/Throat:     Mouth: Mucous membranes are moist.  Eyes:     Conjunctiva/sclera: Conjunctivae normal.  Cardiovascular:     Rate and Rhythm: Normal rate.  Pulmonary:     Effort: Pulmonary effort is normal. No respiratory distress.  Chest:     Chest wall: No mass, deformity or swelling.       Comments: Small area of grouped vesicular lesions to right anterior chest wall, vesicular areas to right thoracic area as well, consistent with herpes zoster.  Musculoskeletal:        General: Normal range of motion.        Back:     Comments: Small area of grouped vesicular lesions to right thoracic area.  Skin:    General: Skin is warm and dry.     Findings: Rash present.  Neurological:     General: No focal deficit present.     Mental Status: He is alert and oriented to person, place, and time.  Psychiatric:        Mood and Affect: Mood normal.        Behavior: Behavior normal. Behavior is cooperative.      UC Treatments / Results  Labs (all labs ordered are listed, but only abnormal results are displayed) Labs Reviewed - No data to display  EKG   Radiology No results found.  Procedures Procedures (including critical care time)  Medications Ordered in UC Medications - No data to display  Initial Impression / Assessment and Plan / UC Course  I have reviewed the triage vital signs and the nursing notes.  Pertinent labs & imaging results that were available during my care of the patient were reviewed by me and considered in my medical decision making (see chart for details).  Vitals and triage reviewed, patient is hemodynamically stable.  Small area of grouped vesicles on the anterior right chest and posterior thoracic area consistent with herpes zoster. Valtrex prescribed.  Plan of care, follow-up care and return precautions given, no questions at this time.    Final Clinical Impressions(s) / UC Diagnoses   Final diagnoses:  Herpes zoster without complication  Discharge Instructions      El dolor y la erupcin son compatibles con el herpes zster. Comience a tomar Valtrex 3 veces al [employer] prximos 7 das y [place] las ampollas cubiertas hasta que se curen. Puede aplicar un antibacteriano tpico de venta libre, como Neosporin, para ayudar a Tax inspector.  Si esto vuelve a ocurrir, consulte con su mdico de cabecera o con el servicio de urgencias dentro de los [address] de la presentacin, ya que es cuando el tratamiento antiviral es ms  eficaz. Regrese a la clnica si aparecen sntomas nuevos o urgentes.  Your pain and rash are consistent with shingles.  Please start taking the Valtrex 3 times daily for the next 7 days and keep the blisters covered until they heal.  You can apply an over-the-counter topical antibacterial such as Neosporin to help prevent secondary infection.  If this occurs again please follow-up with your primary care or here at the urgent care within the first few days of presentation, as this is when the antiviral treatment is most effective.  Return to clinic for new or urgent symptoms.      ED Prescriptions     Medication Sig Dispense Auth. Provider   valACYclovir (VALTREX) 1000 MG tablet Take 1 tablet (1,000 mg total) by mouth 3 (three) times daily for 7 days. 21 tablet Giordan Fordham, [place] N, [place]      PDMP not reviewed this encounter.   Geno Sydnor, [place] N, [place] 10/11/22 1314

## 2022-10-11 NOTE — Progress Notes (Addendum)
10/11/2022  Patient ID: Andrew Bruce, male   DOB: [birth year], 58 y.o.   MRN: [id number]  Briefly spoke with the patient on the phone. Says he left Urgent Care and is at the pharmacy picking up his medications now. No further concerns at this time. Advised I will call in 2 weeks to see if anything new has popped up.   Appears he received all medications except Citalopram and Tramadol today.    [name], PharmD [facility] Phone Number: [phone]

## 2022-10-11 NOTE — Discharge Instructions (Signed)
El dolor y la erupcin son compatibles con el herpes zster. Comience a tomar Valtrex 3 veces al [employer] prximos 7 das y [place] las ampollas cubiertas hasta que se curen. Puede aplicar un antibacteriano tpico de venta libre, como Neosporin, para ayudar a Tax inspector.  Si esto vuelve a ocurrir, consulte con su mdico de cabecera o con el servicio de urgencias dentro de los [address] de la presentacin, ya que es cuando el tratamiento antiviral es ms eficaz. Regrese a la clnica si aparecen sntomas nuevos o urgentes.  Your pain and rash are consistent with shingles.  Please start taking the Valtrex 3 times daily for the next 7 days and keep the blisters covered until they heal.  You can apply an over-the-counter topical antibacterial such as Neosporin to help prevent secondary infection.  If this occurs again please follow-up with your primary care or here at the urgent care within the first few days of presentation, as this is when the antiviral treatment is most effective.  Return to clinic for new or urgent symptoms.

## 2022-10-16 ENCOUNTER — Other Ambulatory Visit (HOSPITAL_COMMUNITY): Payer: Self-pay

## 2022-10-31 ENCOUNTER — Encounter: Payer: Self-pay | Admitting: Pharmacist

## 2022-10-31 ENCOUNTER — Other Ambulatory Visit: Payer: Self-pay | Admitting: Pharmacist

## 2022-10-31 NOTE — Progress Notes (Signed)
 10/31/2022 Name: Andrew Bruce MRN: [id number] DOB: 07/07/1965  Chief Complaint  Patient presents with   Hypertension   Hyperlipidemia   Diabetes    Andrew Bruce is a 57 y.o. year old male who presented for a telephone visit.   They were referred to the pharmacist by their PCP for assistance in managing diabetes, hypertension, and hyperlipidemia.    Subjective:  Care Team: Primary Care Provider: [name], NP ; Next Scheduled Visit: 12/27/2022 Neurologist: [name], NP; Next Scheduled Visit: 09/17/2023  Medication Access/Adherence  Current Pharmacy:  [facility] [address], [place] - [address] [place] [address] Phone: [phone] Fax: [phone]  [name] [address] [place] [address] Phone: [phone] Fax: [phone]   Patient reports affordability concerns with their medications: Yes  Patient reports access/transportation concerns to their pharmacy: No  Patient reports adherence concerns with their medications:  Yes    *Reports using a weekly pillbox, but will need medications refilled. Regular adherence calls advised.   Diabetes:  Current medications: Metformin 500mg  twice a day Medications tried in the past: Glipizide 5mg - holding currently  Current glucose readings: FBG of around 140 Using meter; testing 1 times daily   Patient denies hypoglycemic s/sx including dizziness, shakiness, sweating. Patient denies hyperglycemic symptoms including polyuria, polydipsia, polyphagia, nocturia, neuropathy, blurred vision.  Did have an occurrence of shakiness previously, but issues have resolved. Takes medications with food in the morning as recommended.  Current meal patterns:  Breakfast/lunch: Chicken breast and salad Dinner: Whatever wife cooks Drinks: Water, 1 glass of milk  Hypertension:  Current medications: Amlodipine 10mg ,  Lisinopril 40mg  Medications previously tried: N/A  Patient has a validated, automated, upper arm home BP cuff Current blood pressure readings readings: 120-130's/70's  Patient denies hypotensive s/sx including dizziness, lightheadedness.  Patient denies hypertensive symptoms including headache, chest pain, shortness of breath    Hyperlipidemia/ASCVD Risk Reduction  Current lipid lowering medications: Atorvastatin 40mg  Medications tried in the past: N/A  Antiplatelet regimen: Aspirin 81mg   ASCVD History: Stroke in 2021  10 year ASCVD risk of 18.3%    Objective:  Lab Results  Component Value Date   HGBA1C 8.0 (A) 09/27/2022    Lab Results  Component Value Date   CREATININE 0.81 06/27/2022   BUN 15 06/27/2022   NA 141 06/27/2022   K 4.1 06/27/2022   CL 103 06/27/2022   CO2 19 (L) 06/27/2022    Lab Results  Component Value Date   CHOL 239 (H) 06/27/2022   HDL 48 06/27/2022   LDLCALC 142 (H) 06/27/2022   LDLDIRECT 73.0 02/18/2019   TRIG 272 (H) 06/27/2022   CHOLHDL 5.0 06/27/2022    Medications Reviewed Today     Reviewed by Pollie Friar, RPH (Pharmacist) on 10/31/22 at 1551  Med List Status: <None>   Medication Order Taking? Sig Documenting Provider Last Dose Status Informant  Accu-Chek Softclix Lancets lancets [id number] Yes Use as instructed to check blood sugar daily. Dx E11.65 [name], NP Taking Active   acetaminophen (TYLENOL) 500 MG tablet [id number] Yes Take by mouth. [provider] Taking Active            Med Note [name]   Wed Mar 27, 2022  3:20 PM) Prn    amitriptyline (ELAVIL) 25 MG tablet [id number] No Take 1 tablet (25 mg total) by mouth at bedtime.  Patient not taking: Reported on 10/31/2022  [name], DO Not Taking Active   amLODipine (NORVASC) 10 MG tablet [id number] Yes Take 1 tablet (10 mg total) by mouth daily. [name], NP Taking Active   aspirin EC 81 MG tablet [id number] Yes Take 1 tablet by  mouth daily. [provider] Taking Active   atorvastatin (LIPITOR) 40 MG tablet [id number] Yes Take 1 tablet (40 mg total) by mouth daily. [name], NP Taking Active   citalopram (CELEXA) 20 MG tablet [id number] Yes Take 1 tablet (20 mg total) by mouth daily. [name], [name], MD Taking Active   citalopram (CELEXA) 20 MG tablet [id number] Yes Take 1 tablet (20 mg total) by mouth daily. For mood [name], [name], MD Taking Active   gabapentin (NEURONTIN) 100 MG capsule [id number] Yes Take 1 capsule (100 mg total) by mouth at bedtime. [name], MD Taking Active   glipiZIDE (GLUCOTROL) 5 MG tablet [id number] No Take 1 tablet (5 mg total) by mouth daily before breakfast.  Patient not taking: Reported on 10/11/2022   [name], MD Not Taking Active            Med Note Lorenso Courier, Wyn N[place] M   Fri Oct 11, 2022  4:10 PM) Hold for now  ibuprofen (ADVIL) 800 MG tablet [id number] Yes Take by mouth. [provider] Taking Active            Med Note [name], CHERYL A   Tue Apr 09, 2022  2:43 PM) As needed  lisinopril (ZESTRIL) 40 MG tablet [id number] Yes Take 1 tablet (40 mg total) by mouth daily. [name], NP Taking Active   metFORMIN (GLUCOPHAGE-XR) 500 MG 24 hr tablet [id number] Yes Take 2 tablets (1,000 mg total) by mouth 2 (two) times daily with a meal. [name], NP Taking Active   metoprolol tartrate (LOPRESSOR) 25 MG tablet [id number] Yes Take 1 tablet (25 mg total) by mouth 2 (two) times daily. For blood pressure [name], NP Taking Active   traMADol (ULTRAM) 50 MG tablet [id number] Yes Take 2 tablets (100 mg total) by mouth in the morning, at noon, and at bedtime. For chronic pain [name], [name], MD Taking Active               Assessment/Plan:   Diabetes: - Currently uncontrolled - Reviewed long term cardiovascular and renal outcomes of uncontrolled blood sugar - Reviewed goal A1c, goal fasting, and goal 2 hour post prandial glucose - Recommend to  check glucose daily   Hypertension: - Currently controlled - Reviewed long term cardiovascular and renal outcomes of uncontrolled blood pressure - Reviewed appropriate blood pressure monitoring technique and reviewed goal blood pressure. Recommended to check home blood pressure and heart rate daily    Hyperlipidemia/ASCVD Risk Reduction: - Currently uncontrolled.  - Reviewed long term complications of uncontrolled cholesterol   **Follow Up Plan:**  - BP was 128/72 on the phone today and FBG has been around 140- advised we will keep this for now until next A1c is checked in December - For future, consider adding Glipizide XL 5mg - concerns about lows with prior IR 5mg  dose - NO insurance currently - Get lipid panel and A1c again in December 2024 - Has all of his meds except Citalopram and Tramadol currently- Citalopram due soon; confirms taking aspirin OTC - Set-up for [facility] adherence packaging- advised these should be scheduled each month to be filled- can call the pharmacy with any concerns   [name], PharmD [facility] Phone Number: [phone]

## 2022-11-04 ENCOUNTER — Other Ambulatory Visit: Payer: Self-pay

## 2022-11-04 ENCOUNTER — Other Ambulatory Visit (HOSPITAL_COMMUNITY): Payer: Self-pay

## 2022-11-11 ENCOUNTER — Encounter
Payer: Medicare Other | Attending: Physical Medicine and Rehabilitation | Admitting: Physical Medicine and Rehabilitation

## 2022-11-11 ENCOUNTER — Other Ambulatory Visit (HOSPITAL_COMMUNITY): Payer: Self-pay

## 2022-11-11 ENCOUNTER — Encounter: Payer: Self-pay | Admitting: Physical Medicine and Rehabilitation

## 2022-11-11 ENCOUNTER — Other Ambulatory Visit: Payer: Self-pay

## 2022-11-11 VITALS — BP 114/79 | HR 60 | Ht 67.0 in | Wt 194.0 lb

## 2022-11-11 DIAGNOSIS — M792 Neuralgia and neuritis, unspecified: Secondary | ICD-10-CM | POA: Diagnosis not present

## 2022-11-11 DIAGNOSIS — G8111 Spastic hemiplegia affecting right dominant side: Secondary | ICD-10-CM | POA: Diagnosis not present

## 2022-11-11 DIAGNOSIS — G6281 Critical illness polyneuropathy: Secondary | ICD-10-CM | POA: Insufficient documentation

## 2022-11-11 DIAGNOSIS — G894 Chronic pain syndrome: Secondary | ICD-10-CM | POA: Diagnosis not present

## 2022-11-11 MED ORDER — PREGABALIN 150 MG PO CAPS
150.0000 mg | ORAL_CAPSULE | Freq: Three times a day (TID) | ORAL | 5 refills | Status: DC
  Filled 2022-11-11 (×3): qty 90, 30d supply, fill #0

## 2022-11-11 NOTE — Patient Instructions (Signed)
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 Original admission 01/26/19- and came to [facility] 03/23/19- discharged- 04/20/19.  Has visual difficulties- double vision due to stroke.  Here for f/u on stroke as well as nerve pain and DM.     A doctor discontinued his Lyrica, because he couldn't afford to take it- I restarted it- 150 mg 3x/day- for nerve pain. Some way they stopped his Lyrica because he hadn't taken in 30 days-    2. Will con't Tramadol-  100 mg 3x/day- -they have rx-  Asked them to fill it for pt.   3. Wants Pregabalin and Tramadol. Doesn't want Citalopram   4. Con't Meds for Blood pressure and diabetes.    5.  Needs to go to [facility] pharmacy to get list of meds for insurance Part D.     6. Spent 30 minutes on phone with [facility] figuring out meds and cost for pt- and making sure they don't d/c meds-    7.  F/U in 3months- double appt- CVA

## 2022-11-11 NOTE — Progress Notes (Signed)
Subjective:    Patient ID: Andrew Bruce, male    DOB: [birth year], 57 y.o.   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 Original admission 01/26/19- and came to [facility] 03/23/19- discharged- 04/20/19.  Has visual difficulties- double vision due to stroke.  Here for f/u on stroke as well as nerve pain and DM.    Things getting much better   Tramadol- was changed- I changed it- to 100 mg TID.   Showed me his pil pack- doesn't have his Celexa, Tramadol and Lyrica.    Is getting gabapentin 100 mg at bedtime- from PCP-  But doesn't have Celexa, Lyrica or Tramadol-   Was told by [facility]- that would be $60 for Celexa, Lyrica and Tramadol-   Got one of them filled once-   Pays 39$ for 4 meds in pill pack.  But couldn't afford Celexa, Lyrica and Tramadol- since were $20 each.   Mood is OK- doesn't feel like needs Celexa anymore.    Pain Inventory Average Pain 8 Pain Right Now 8 My pain is burning  LOCATION OF PAIN  knee, leg  BOWEL Number of stools per week: 1 Oral laxative use No  Type of laxative . Enema or suppository use No  History of colostomy No  Incontinent No   BLADDER Normal In and out cath, frequency . Able to self cath  . Bladder incontinence No  Frequent urination No  Leakage with coughing No  Difficulty starting stream No  Incomplete bladder emptying No    Mobility use a cane how many minutes can you walk? 20 ability to climb steps?  yes do you drive?  yes  Function disabled: date disabled . I need assistance with the following:   shopping  Neuro/Psych numbness tremor tingling trouble walking spasms dizziness confusion anxiety loss of taste or smell  Prior Studies Any changes since last visit?  no  Physicians involved in your care Any changes since last visit?  no   Family History  Problem Relation Age of Onset   Healthy Sister    Healthy Brother    Social History   Socioeconomic History   Marital status: Married    Spouse name: Not on file   Number of children: 8   Years of education: Not on file   Highest education level: 6th grade  Occupational History   Not on file  Tobacco Use   Smoking status: Former   Smokeless tobacco: Never  Vaping Use   Vaping status: Never Used  Substance and Sexual Activity   Alcohol use: Not Currently    Comment: rare to occasional   Drug use: Never   Sexual activity: Yes    Birth control/protection: None  Other Topics Concern   Not on file  Social History Narrative   Not on file   Social Determinants of Health   Financial Resource Strain: High Risk (09/21/2021)   Received from [facility], [facility]   Overall Financial Resource Strain (CARDIA)    Difficulty of Paying Living Expenses: Very hard  Food Insecurity: Food Insecurity Present (09/21/2021)   Received from [facility], [facility]   Hunger Vital Sign    Worried About Running Out of Food in the Last Year: Often true    Ran Out of Food in the Last Year: Often true  Transportation Needs: No Transportation Needs (09/21/2021)   Received from [facility], [facility]   [facility] - Transportation    Lack of Transportation (Medical): No    Lack of Transportation (Non-Medical): No  Physical Activity: Not on file  Stress: Not on file  Social Connections: Not on file   Past Surgical History:  Procedure Laterality Date   AMPUTATION Right 08/13/2019   Procedure: AMPUTATION RIGHT FIRST AND SECOND TOES;  Surgeon: Larina Earthly, MD;  Location: [facility] OR;  Service: Vascular;   Laterality: Right;   DIRECT LARYNGOSCOPY Bilateral 04/06/2019   Procedure: MICRO DIRECT LARYNGOSCOPY WITH PROLARYN INJECTION;  Surgeon: Christia Reading, MD;  Location: [facility];  Service: ENT;  Laterality: Bilateral;   IR GASTROSTOMY TUBE MOD SED  03/10/2019   IR GASTROSTOMY TUBE REMOVAL  04/20/2019   WOUND DEBRIDEMENT Left 08/13/2019   Procedure: AMPUTATION  OF LEFT FIRST TOE, AND DEBRIDEMENT OF LEFT SECOND, THIRD AND FOURTH TOES;  Surgeon: Larina Earthly, MD;  Location: [facility] OR;  Service: Vascular;  Laterality: Left;   Past Medical History:  Diagnosis Date   COVID-19 02/06/2019   Diabetes mellitus without complication (HCC)    Hypertension    PE (pulmonary thromboembolism) (HCC)    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   Pneumonia    with Covid   Stroke (HCC)    weakness on right side, occurred while he had Covid   BP 114/79   Pulse 60   Ht 5\' 7"  (1.702 m)   Wt 194 lb (88 kg)   SpO2 92%   BMI 30.38 kg/m   Opioid Risk Score:   Fall Risk Score:  `1  Depression screen [facility] 2/9     06/27/2022    2:51 PM 03/27/2022    3:25 PM 11/30/2021    2:34 PM 09/26/2021    3:44 PM 08/13/2021    3:01 PM 06/26/2021    1:23 PM 05/01/2021    3:46 PM  Depression screen PHQ 2/9  Decreased Interest 3 3 3  0 0 0 1  Down, Depressed, Hopeless 3 3 3  0 0 0 1  PHQ - 2 Score 6 6 6  0 0 0 2  Altered sleeping 3 3  3   0 1  Tired, decreased energy 3 3  3   0 1  Change in appetite 3 3  0  0 1  Feeling bad or failure about yourself  2 3  0     Trouble concentrating 3 3  3   0 0  Moving slowly or fidgety/restless 3 3  0  0 0  Suicidal thoughts 0 1  0  0 0  PHQ-9 Score 23 25  9   0 5  Difficult doing work/chores Very difficult Very difficult           Review of Systems  Musculoskeletal:        Spasms  Neurological:  Positive for dizziness, tremors and numbness.  Psychiatric/Behavioral:  Positive for confusion. The patient is nervous/anxious.   All other systems reviewed and are negative.      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 Original admission 01/26/19- and came to CIR 03/23/19- discharged- 04/20/19.  Has visual difficulties- double vision due to stroke.  Here for f/u on stroke as well as nerve pain and DM.     A doctor discontinued his Lyrica, because he couldn't afford to take it- I restarted it- 150 mg 3x/day- for nerve pain. Some way they stopped his Lyrica because he hadn't taken in 30 days-    2. Will con't Tramadol-  100 mg 3x/day- -they have rx-  Asked them to fill it for pt.   3. Wants Pregabalin and Tramadol. Doesn't want Citalopram   4. Con't Meds for Blood pressure and diabetes.    5.  Needs to go to [facility] pharmacy to get list of meds for insurance Part D.     6. Spent 25 minutes on phone with [facility] figuring out meds and cost for pt- and making sure they don't d/c meds-    7.  F/U in 3months- double appt- CVA

## 2022-11-12 ENCOUNTER — Other Ambulatory Visit (HOSPITAL_COMMUNITY): Payer: Self-pay

## 2022-11-12 ENCOUNTER — Other Ambulatory Visit: Payer: Self-pay

## 2022-11-15 ENCOUNTER — Other Ambulatory Visit: Payer: Self-pay

## 2022-11-27 ENCOUNTER — Other Ambulatory Visit: Payer: Self-pay

## 2022-12-10 ENCOUNTER — Other Ambulatory Visit: Payer: Self-pay

## 2022-12-13 ENCOUNTER — Other Ambulatory Visit: Payer: Self-pay

## 2022-12-16 ENCOUNTER — Telehealth: Payer: Self-pay | Admitting: Pharmacist

## 2022-12-16 ENCOUNTER — Other Ambulatory Visit: Payer: Self-pay | Admitting: Pharmacist

## 2022-12-16 NOTE — Progress Notes (Signed)
   12/16/2022  Patient ID: Andrew Bruce, male   DOB: [birth year], 57 y.o.   MRN: [id number]  Tried calling patient today for follow-up call with assistance of Spanish interpreter. Unable to reach at this time. Left HIPAA compliant voicemail requesting call back at earliest convenience.  For returned call: On 11/29 picked up: Citalopram, Amlodipine, Gabapentin, Lisinopril, Metformin, Lopressor  Due: Atorvastatin 12/22  Is he taking both the Lyrica (p/u on 11/1) and Gabapentin (p/u 11/29)??   [name], PharmD [facility] Phone Number: [phone]

## 2022-12-24 DIAGNOSIS — R490 Dysphonia: Secondary | ICD-10-CM | POA: Diagnosis not present

## 2022-12-25 ENCOUNTER — Other Ambulatory Visit: Payer: Self-pay | Admitting: Neurology

## 2022-12-25 ENCOUNTER — Other Ambulatory Visit: Payer: Self-pay

## 2022-12-25 MED ORDER — GABAPENTIN 100 MG PO CAPS
100.0000 mg | ORAL_CAPSULE | Freq: Every day | ORAL | 2 refills | Status: DC
  Filled 2022-12-25 – 2022-12-26 (×2): qty 30, 30d supply, fill #0

## 2022-12-26 ENCOUNTER — Other Ambulatory Visit: Payer: Self-pay

## 2022-12-27 ENCOUNTER — Ambulatory Visit (INDEPENDENT_AMBULATORY_CARE_PROVIDER_SITE_OTHER): Payer: Medicare Other | Admitting: Nurse Practitioner

## 2022-12-27 ENCOUNTER — Encounter: Payer: Self-pay | Admitting: Nurse Practitioner

## 2022-12-27 VITALS — BP 98/63 | HR 58 | Wt 189.0 lb

## 2022-12-27 DIAGNOSIS — E1165 Type 2 diabetes mellitus with hyperglycemia: Secondary | ICD-10-CM

## 2022-12-27 DIAGNOSIS — M79672 Pain in left foot: Secondary | ICD-10-CM | POA: Diagnosis not present

## 2022-12-27 DIAGNOSIS — M79671 Pain in right foot: Secondary | ICD-10-CM

## 2022-12-27 LAB — POCT GLYCOSYLATED HEMOGLOBIN (HGB A1C): Hemoglobin A1C: 7.2 % — AB (ref 4.0–5.6)

## 2022-12-27 NOTE — Addendum Note (Signed)
Addended by: [name] on: 12/27/2022 03:51 PM   Modules accepted: Orders

## 2022-12-27 NOTE — Patient Instructions (Signed)
1. Uncontrolled type 2 diabetes mellitus with hyperglycemia (HCC) (Primary)  - CBC - Comprehensive metabolic panel - Ambulatory referral to Podiatry  2. Bilateral foot pain  - Ambulatory referral to Podiatry   Follow up:  Follow up in 3 months

## 2022-12-27 NOTE — Progress Notes (Signed)
Subjective   Patient ID: Andrew Bruce, male    DOB: [birth year], 57 y.o.   MRN: [id number]  Chief Complaint  Patient presents with   Diabetes    Referring provider: [name], NP  Andrew Bruce is a 57 y.o. male with Past Medical History: 02/06/2019: COVID-19 No date: Diabetes mellitus without complication (HCC) No date: Hypertension No date: PE (pulmonary thromboembolism) (HCC)     Comment:  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 in setting of COVID               ARDS No date: Pneumonia     Comment:  with Covid No date: Stroke [facility])     Comment:  weakness on right side, occurred while he had Covid   Diabetes    Diabetes:   Patient presents for diabetic follow up. Currently taking metformin and glipizide. Complaint with diabetic diet. Taking medications. A1C today is 7.2. We have placed a referral for diabetic medication management with pharmacist.    Hypertension:   Taking medications as directed. Checking blood pressures at home - within normal range. No issues or concerns.  Is requesting referral to podiatry for bilateral foot pain     Denies f/c/s, n/v/d, hemoptysis, PND, leg swelling Denies chest pain or edema    No Known Allergies  Immunization History  Administered Date(s) Administered   Influenza, Seasonal, Injecte, Preservative Fre 09/27/2022   Influenza,inj,Quad PF,6+ Mos 03/25/2019, 11/17/2020   Pneumococcal Polysaccharide-23 03/25/2019, 11/17/2020    Tobacco History: Social History   Tobacco Use  Smoking Status Former  Smokeless Tobacco Never   Counseling given: Not Answered   Outpatient Encounter Medications as of 12/27/2022  Medication Sig   Accu-Chek Softclix Lancets lancets Use as instructed to check blood sugar daily. Dx E11.65   acetaminophen (TYLENOL) 500 MG tablet Take by mouth.   amLODipine (NORVASC) 10 MG tablet Take 1 tablet (10 mg total) by mouth daily.   aspirin EC 81 MG tablet Take 1 tablet by mouth daily.    atorvastatin (LIPITOR) 40 MG tablet Take 1 tablet (40 mg total) by mouth daily.   citalopram (CELEXA) 20 MG tablet Take 1 tablet (20 mg total) by mouth daily.   gabapentin (NEURONTIN) 100 MG capsule Take 1 capsule (100 mg total) by mouth at bedtime.   glipiZIDE (GLUCOTROL) 5 MG tablet Take 1 tablet (5 mg total) by mouth daily before breakfast.   ibuprofen (ADVIL) 800 MG tablet Take by mouth.   lisinopril (ZESTRIL) 40 MG tablet Take 1 tablet (40 mg total) by mouth daily.   metFORMIN (GLUCOPHAGE-XR) 500 MG 24 hr tablet Take 2 tablets (1,000 mg total) by mouth 2 (two) times daily with a meal.   metoprolol tartrate (LOPRESSOR) 25 MG tablet Take 1 tablet (25 mg total) by mouth 2 (two) times daily. For blood pressure   traMADol (ULTRAM) 50 MG tablet Take 2 tablets (100 mg total) by mouth in the morning, at noon, and at bedtime. For chronic pain   amitriptyline (ELAVIL) 25 MG tablet Take 1 tablet (25 mg total) by mouth at bedtime. (Patient not taking: Reported on 12/27/2022)   citalopram (CELEXA) 20 MG tablet Take 1 tablet (20 mg total) by mouth daily. For mood   No facility-administered encounter medications on file as of 12/27/2022.    Review of Systems  Review of Systems  Constitutional: Negative.   HENT: Negative.    Cardiovascular: Negative.   Gastrointestinal: Negative.   Allergic/Immunologic: Negative.   Neurological: Negative.  Psychiatric/Behavioral: Negative.       Objective:   BP 98/63   Pulse (!) 58   Wt 189 lb (85.7 kg)   SpO2 98%   BMI 29.60 kg/m   Wt Readings from Last 5 Encounters:  12/27/22 189 lb (85.7 kg)  11/11/22 194 lb (88 kg)  10/11/22 193 lb (87.5 kg)  09/27/22 192 lb (87.1 kg)  09/12/22 193 lb (87.5 kg)     Physical Exam Vitals and nursing note reviewed.  Constitutional:      General: He is not in acute distress.    Appearance: He is well-developed.  Cardiovascular:     Rate and Rhythm: Normal rate and regular rhythm.  Pulmonary:     Effort:  Pulmonary effort is normal.     Breath sounds: Normal breath sounds.  Skin:    General: Skin is warm and dry.  Neurological:     Mental Status: He is alert and oriented to person, place, and time.       Assessment & Plan:   Uncontrolled type 2 diabetes mellitus with hyperglycemia (HCC) -     CBC -     Comprehensive metabolic panel -     Ambulatory referral to Podiatry  Bilateral foot pain -     Ambulatory referral to Podiatry     Return in about 3 months (around 03/27/2023).   [name], NP 12/27/2022

## 2022-12-28 LAB — COMPREHENSIVE METABOLIC PANEL
ALT: 19 [IU]/L (ref 0–44)
AST: 17 [IU]/L (ref 0–40)
Albumin: 4.4 g/dL (ref 3.8–4.9)
Alkaline Phosphatase: 67 [IU]/L (ref 44–121)
BUN/Creatinine Ratio: 19 (ref 9–20)
BUN: 22 mg/dL (ref 6–24)
Bilirubin Total: 0.4 mg/dL (ref 0.0–1.2)
CO2: 19 mmol/L — ABNORMAL LOW (ref 20–29)
Calcium: 9.2 mg/dL (ref 8.7–10.2)
Chloride: 102 mmol/L (ref 96–106)
Creatinine, Ser: 1.13 mg/dL (ref 0.76–1.27)
Globulin, Total: 2.5 g/dL (ref 1.5–4.5)
Glucose: 135 mg/dL — ABNORMAL HIGH (ref 70–99)
Potassium: 4.4 mmol/L (ref 3.5–5.2)
Sodium: 140 mmol/L (ref 134–144)
Total Protein: 6.9 g/dL (ref 6.0–8.5)
eGFR: 76 mL/min/{1.73_m2} (ref >59)

## 2022-12-28 LAB — CBC
Hematocrit: 40.2 % (ref 37.5–51.0)
Hemoglobin: 13.4 g/dL (ref 13.0–17.7)
MCH: 29.1 pg (ref 26.6–33.0)
MCHC: 33.3 g/dL (ref 31.5–35.7)
MCV: 87 fL (ref 79–97)
Platelets: 327 10*3/uL (ref 150–450)
RBC: 4.6 x10E6/uL (ref 4.14–5.80)
RDW: 12.2 % (ref 11.6–15.4)
WBC: 6.6 10*3/uL (ref 3.4–10.8)

## 2023-01-06 ENCOUNTER — Other Ambulatory Visit: Payer: Self-pay | Admitting: Pharmacist

## 2023-01-06 NOTE — Progress Notes (Unsigned)
01/09/2023 Name: Andrew Bruce MRN: [id number] DOB: Feb 16, 1965  Chief Complaint  Patient presents with   Medication Management    Andrew Bruce is a 57 y.o. year old male who presented for a telephone visit.   They were referred to the pharmacist by their PCP for assistance in managing diabetes, hypertension, and hyperlipidemia.    Subjective:  Care Team: Primary Care Provider: [name], NP ; Next Scheduled Visit: 03/28/23 Neurologist: [name], NP; Next Scheduled Visit: 09/17/2023  Medication Access/Adherence  Current Pharmacy:  [facility] [address] [place] [address] Phone: [phone] Fax: [phone]  [facility] [address] [place] [address] Phone: [phone] Fax: [phone]   Patient reports affordability concerns with their medications: Yes  Patient reports access/transportation concerns to their pharmacy: No  Patient reports adherence concerns with their medications:  Yes    *Reports using a weekly pillbox, but will need medications refilled. Regular adherence calls advised.   Diabetes:  Current medications: Metformin XR 500mg  (2000mg  max dose daily) Medications tried in the past: Glipizide 5mg - holding currently  Current glucose readings: FBG of around 117-140 Using meter; testing 1 times daily   Patient denies hypoglycemic s/sx including dizziness, shakiness, sweating. Patient denies hyperglycemic symptoms including polyuria, polydipsia, polyphagia, nocturia, neuropathy, blurred vision.  Did have an occurrence of shakiness previously, but issues have resolved. Takes medications with food in the morning as recommended.  Current meal patterns:  Breakfast/lunch: Chicken breast and salad Dinner: Whatever wife cooks Drinks: Water, 1 glass of milk  Hypertension:  Current medications: Amlodipine 10mg , Lisinopril  40mg  Medications previously tried: N/A  Patient has a validated, automated, upper arm home BP cuff Current blood pressure readings readings: 120-130's/70's  Patient denies hypotensive s/sx including dizziness, lightheadedness.  Patient denies hypertensive symptoms including headache, chest pain, shortness of breath    Hyperlipidemia/ASCVD Risk Reduction  Current lipid lowering medications: Atorvastatin 40mg  Medications tried in the past: N/A  Antiplatelet regimen: Aspirin 81mg   ASCVD History: Stroke in 2021  10 year ASCVD risk of 18.3%    Objective:  Lab Results  Component Value Date   HGBA1C 7.2 (A) 12/27/2022    Lab Results  Component Value Date   CREATININE 1.13 12/27/2022   BUN 22 12/27/2022   NA 140 12/27/2022   K 4.4 12/27/2022   CL 102 12/27/2022   CO2 19 (L) 12/27/2022    Lab Results  Component Value Date   CHOL 239 (H) 06/27/2022   HDL 48 06/27/2022   LDLCALC 142 (H) 06/27/2022   LDLDIRECT 73.0 02/18/2019   TRIG 272 (H) 06/27/2022   CHOLHDL 5.0 06/27/2022    Medications Reviewed Today   Medications were not reviewed in this encounter       Assessment/Plan:   Diabetes: - Currently controlled - Reviewed long term cardiovascular and renal outcomes of uncontrolled blood sugar - Reviewed goal A1c, goal fasting, and goal 2 hour post prandial glucose - Recommend to check glucose daily   Hypertension: - Currently controlled - Reviewed long term cardiovascular and renal outcomes of uncontrolled blood pressure - Reviewed appropriate blood pressure monitoring technique and reviewed goal blood pressure. Recommended to check home blood pressure and heart rate daily    Hyperlipidemia/ASCVD Risk Reduction: - Currently uncontrolled.  - Reviewed long term complications of uncontrolled cholesterol   **Follow Up Plan:**  - Follow-up in 1 month to see update on compliance packaging medications that are  due and ensuring correct medications are being  sent - Discuss with Dr. Tanda Rockers whether the Gabapentin or Pregabalin needs discontinued  - Advised to discontinue the Gabapentin and re-start Pregabalin per PCP - For future, consider adding Glipizide XL 5mg - concerns about lows with prior IR 5mg  dose - NO insurance currently - Lipid panel due at next visit to see outcome of adherence to Atorvastatin - Call the pharmacy to see which medications are being filled as he plans to pick them up on 01/10/23 (Friday)  - Picking up Amlodipine, Atorvastatin, Gabapentin, Metformin, Metoprolol, Lisinopril   - Will change Gabapentin vs Pregabalin at next fill since packaging already completed; told it will be in a separate bottle due to being a controlled substance   [name], PharmD [facility] Phone Number: [phone]

## 2023-01-09 ENCOUNTER — Other Ambulatory Visit: Payer: Self-pay

## 2023-01-13 ENCOUNTER — Other Ambulatory Visit (HOSPITAL_COMMUNITY): Payer: Self-pay

## 2023-01-14 ENCOUNTER — Other Ambulatory Visit: Payer: Self-pay

## 2023-01-17 ENCOUNTER — Other Ambulatory Visit: Payer: Self-pay

## 2023-01-20 ENCOUNTER — Other Ambulatory Visit: Payer: Self-pay | Admitting: Neurology

## 2023-01-20 ENCOUNTER — Other Ambulatory Visit: Payer: Self-pay

## 2023-01-22 ENCOUNTER — Other Ambulatory Visit: Payer: Self-pay

## 2023-01-22 ENCOUNTER — Ambulatory Visit: Payer: Medicare Other | Admitting: Podiatry

## 2023-01-28 ENCOUNTER — Other Ambulatory Visit: Payer: Self-pay

## 2023-01-31 ENCOUNTER — Other Ambulatory Visit: Payer: Self-pay

## 2023-02-04 ENCOUNTER — Other Ambulatory Visit: Payer: Self-pay

## 2023-02-06 ENCOUNTER — Other Ambulatory Visit: Payer: Self-pay | Admitting: Pharmacist

## 2023-02-06 ENCOUNTER — Other Ambulatory Visit: Payer: Self-pay

## 2023-02-06 DIAGNOSIS — G6281 Critical illness polyneuropathy: Secondary | ICD-10-CM

## 2023-02-06 NOTE — Progress Notes (Signed)
   02/06/2023  Patient ID: Andrew Bruce, male   DOB: 05-Oct-1965, 57 y.o.   MRN: [id number]  Called and spoke with the patient on the phone today regarding his medication shipments. He had 2 concerns:  Why did his med cost go from $34 to $59 for next month, along with it containing 1 med less? Appears flat fee $4 code was used for most meds last time and was now $9 flat fee now.  Is he getting Pregabalin or Gabapentin this month? Has severe nerve pain in feet. If so, what is the cost difference?  Waiting on response from the pharmacy. Pick-up scheduled for 1/28 currently.  Update from 02/07/23 call: - Explained the packaging difference of $34 for individual vials vs $59 for compliance packaging- wants to do the $34 as he cannot afford a higher cost - Would prefer to try the Pregabalin (was told it would be $12) instead of Gabapentin this month ($5)- new Rx sent to pharmacy by Dr. Berline Chough - Will communicate to the pharmacy and advised that he may receive a call to verify this information  Update: - Spoke with Dylan and he will cancel the Rx and have [facility] fill the scripts at a lower cost - Set task for 2/27 to see if the pharmacy has filled everything instead along with the Pregabalin   **Of note for future, patient cannot afford cost difference with compliance packaging vs [facility]   [name], PharmD [facility] Phone Number: [phone]

## 2023-02-07 ENCOUNTER — Other Ambulatory Visit: Payer: Self-pay

## 2023-02-07 MED ORDER — PREGABALIN 150 MG PO CAPS
150.0000 mg | ORAL_CAPSULE | Freq: Three times a day (TID) | ORAL | 1 refills | Status: DC
  Filled 2023-02-07: qty 90, 30d supply, fill #0
  Filled 2023-03-14: qty 90, 30d supply, fill #1

## 2023-02-10 ENCOUNTER — Telehealth: Payer: Self-pay | Admitting: Pharmacist

## 2023-02-10 NOTE — Progress Notes (Signed)
   02/10/2023  Patient ID: Andrew Bruce, male   DOB: 10/28/1965, 58 y.o.   MRN: [id number]  Called patient to ensure he knew his medications were ready for pick-up. Said he called and the added another discount while on the phone potentially? Advised he is picking up 7 medications for $23.92.  Will follow-up in 1 month to check-in again.   [name], PharmD [facility] Phone Number: [phone]

## 2023-02-11 ENCOUNTER — Other Ambulatory Visit (HOSPITAL_COMMUNITY): Payer: Self-pay

## 2023-02-12 ENCOUNTER — Encounter: Payer: Self-pay | Admitting: Podiatry

## 2023-02-12 ENCOUNTER — Other Ambulatory Visit: Payer: Self-pay

## 2023-02-12 ENCOUNTER — Ambulatory Visit (INDEPENDENT_AMBULATORY_CARE_PROVIDER_SITE_OTHER): Payer: Medicare Other | Admitting: Podiatry

## 2023-02-12 DIAGNOSIS — E119 Type 2 diabetes mellitus without complications: Secondary | ICD-10-CM

## 2023-02-12 NOTE — Progress Notes (Signed)
Chief Complaint  Patient presents with   Diabetes    "I been experiencing pain from my knees to the soles of my feet." N - pain on soles L - plantar bilateral D - since my toes were amputated two years ago O - suddenly, the same C - sharp pain, Diabetic A - walking, get up in the morning legs feel unrested T - Gabapentin    HPI: 58 y.o. male PMHx T2DM presenting today for routine annual diabetic foot exam.  Patient states that he continues to have some pain and tenderness to the lower extremities bilateral beginning at the level of the knees.  He also has some symptomatic calluses to the bilateral feet.  History of toe amputations bilateral.  Past Medical History:  Diagnosis Date   COVID-19 02/06/2019   Diabetes mellitus without complication (HCC)    Hypertension    PE (pulmonary thromboembolism) (HCC)    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   Pneumonia    with Covid   Stroke (HCC)    weakness on right side, occurred while he had Covid    Past Surgical History:  Procedure Laterality Date   AMPUTATION Right 08/13/2019   Procedure: AMPUTATION RIGHT FIRST AND SECOND TOES;  Surgeon: Larina Earthly, MD;  Location: [facility] OR;  Service: Vascular;  Laterality: Right;   DIRECT LARYNGOSCOPY Bilateral 04/06/2019   Procedure: MICRO DIRECT LARYNGOSCOPY WITH PROLARYN INJECTION;  Surgeon: Christia Reading, MD;  Location: [facility] OR;  Service: ENT;  Laterality: Bilateral;   IR GASTROSTOMY TUBE MOD SED  03/10/2019   IR GASTROSTOMY TUBE REMOVAL  04/20/2019   WOUND DEBRIDEMENT Left 08/13/2019   Procedure: AMPUTATION  OF LEFT FIRST TOE, AND DEBRIDEMENT OF LEFT SECOND, THIRD AND FOURTH TOES;  Surgeon: Larina Earthly, MD;  Location: [facility] OR;  Service: Vascular;  Laterality: Left;    No Known Allergies   Physical Exam: General: The patient is alert and oriented x3 in no acute distress.  Dermatology: Skin is warm, dry and supple bilateral lower extremities.  No open wounds.  Symptomatic callus  lesion noted to the plantar aspect of the fifth MTP of the right foot  Vascular: Palpable pedal pulses bilaterally. Capillary refill within normal limits.  No appreciable edema.  No erythema.  Neurological: Grossly intact via light touch  Musculoskeletal Exam: Prior amputations to the bilateral great toes and second digits  Radiographic Exam B/L feet 02/12/2023:  Prior amputations noted with routine healing.  No osseous erosions concerning for osteomyelitis.  No acute fractures  Assessment/Plan of Care: 1.  Encounter for diabetic foot exam 2.  Diabetes mellitus with history of prior toe amputations  -Patient evaluated.  X-rays reviewed -Comprehensive diabetic foot exam performed today -Continue gabapentin as prescribed -Excisional debridement of the hyperkeratotic callus tissue was performed today using a 312 scalpel without incident or bleeding.  But the patient did feel some relief -Continue to wear good supportive tennis shoes and sneakers -Return to clinic annually       Felecia Shelling, DPM [facility]  Dr. Felecia Shelling, DPM    [address], [place] [address]                Office (  [phone]  Fax [phone]

## 2023-02-13 ENCOUNTER — Other Ambulatory Visit: Payer: Self-pay

## 2023-02-14 ENCOUNTER — Other Ambulatory Visit (HOSPITAL_COMMUNITY): Payer: Self-pay

## 2023-02-14 ENCOUNTER — Other Ambulatory Visit: Payer: Self-pay

## 2023-02-14 ENCOUNTER — Ambulatory Visit: Payer: Medicare Other | Admitting: Physical Medicine and Rehabilitation

## 2023-02-17 ENCOUNTER — Ambulatory Visit: Payer: Medicare Other | Admitting: Podiatry

## 2023-02-18 ENCOUNTER — Other Ambulatory Visit: Payer: Self-pay

## 2023-02-19 NOTE — Therapy (Signed)
 [facility] [address] [place], [place], [address] Phone: [phone]   Fax:  [phone]  Patient Details  Name: Maurion Walkowiak MRN: [id number] Date of Birth: 06-14-1965 Referring Provider:  No ref. provider found  Encounter Date: 02/19/2023  SPEECH THERAPY DISCHARGE SUMMARY  Visits from Start of Care: 1  Current functional level related to goals / functional outcomes: Did not return after evaluation (07/15/22)   Remaining deficits: unknown   Education / Equipment: Abdominal breathing   Patient agrees to discharge. Patient goals were not met. Patient is being discharged due to not returning since the last visit.SABRA Comer LILLETTE Vicci, CCC-SLP 02/19/2023, 9:02 AM  [facility] [address] [place], [place], [address] Phone: [phone]   Fax:  [phone]

## 2023-02-21 ENCOUNTER — Encounter: Payer: Medicare Other | Admitting: Physical Medicine and Rehabilitation

## 2023-02-27 NOTE — Therapy (Unsigned)
OUTPATIENT SPEECH LANGUAGE PATHOLOGY VOICE EVALUATION   Patient Name: Andrew Bruce MRN: [id number] DOB:Jun 08, 1965, 58 y.o., male Today's Date: 03/04/2023  PCP: [name], NP REFERRING PROVIDER: Christene Lye, MD  END OF SESSION:  End of Session - 03/04/23 1517     Visit Number 1    Number of Visits 9    Date for SLP Re-Evaluation 05/06/23    Authorization Type Medicare A & B    SLP Start Time 1530    SLP Stop Time  1615    SLP Time Calculation (min) 45 min    Activity Tolerance Patient tolerated treatment well             Past Medical History:  Diagnosis Date   COVID-19 02/06/2019   Diabetes mellitus without complication (HCC)    Hypertension    PE (pulmonary thromboembolism) (HCC)    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   Pneumonia    with Covid   Stroke (HCC)    weakness on right side, occurred while he had Covid   Past Surgical History:  Procedure Laterality Date   AMPUTATION Right 08/13/2019   Procedure: AMPUTATION RIGHT FIRST AND SECOND TOES;  Surgeon: Larina Earthly, MD;  Location: [facility] OR;  Service: Vascular;  Laterality: Right;   DIRECT LARYNGOSCOPY Bilateral 04/06/2019   Procedure: MICRO DIRECT LARYNGOSCOPY WITH PROLARYN INJECTION;  Surgeon: Christia Reading, MD;  Location[facility] OR;  Service: ENT;  Laterality: Bilateral;   IR GASTROSTOMY TUBE MOD SED  03/10/2019   IR GASTROSTOMY TUBE REMOVAL  04/20/2019   WOUND DEBRIDEMENT Left 08/13/2019   Procedure: AMPUTATION  OF LEFT FIRST TOE, AND DEBRIDEMENT OF LEFT SECOND, THIRD AND FOURTH TOES;  Surgeon: Larina Earthly, MD;  Location: [facility] OR;  Service: Vascular;  Laterality: Left;   Patient Active Problem List   Diagnosis Date Noted   Chronic pain syndrome 11/11/2022   Type 2 diabetes, controlled, with ulcer of toe (HCC) 09/27/2021   Anxiety 07/20/2021   COVID-19 long hauler 07/20/2021   Depression 07/20/2021   History of severe acute respiratory syndrome coronavirus 2 (SARS-CoV-2) disease  07/20/2021   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   Nerve pain 12/15/2020   Diplopia 10/20/2020   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 07/14/2020   Uncontrolled hypertension 02/18/2020   Impaired functional mobility, balance, gait, and endurance 08/23/2019   Amputation of great toe (HCC) 08/23/2019   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   Impaired ambulation 06/11/2019   Dependent on walker for ambulation 04/30/2019   Right spastic hemiparesis (HCC) 04/30/2019   Tachycardia 04/30/2019   Dependence on cane 04/30/2019   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   Acute blood loss anemia    Uncontrolled type 2 diabetes mellitus with hyperglycemia (HCC)    Dry gangrene (HCC)    Leukocytosis    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   Status post tracheostomy (HCC)    Pressure injury of skin 03/02/2019   On mechanically assisted ventilation (HCC)    Goals of care, counseling/discussion    Palliative care encounter    ARDS (adult respiratory distress syndrome) (HCC)    Cerebral embolism with cerebral infarction 02/17/2019   Acute respiratory failure with hypoxia (HCC)    Pneumothorax, right     Onset date: 02/06/2023  REFERRING DIAG: R49.0   THERAPY DIAG:  Other voice and resonance disorders  Rationale for Evaluation and Treatment: Rehabilitation  SUBJECTIVE:  SUBJECTIVE STATEMENT: Pt presents with hoarse, rough voice. Pt accompanied by: interpreter: Greggory Stallion HISTORY: 2021 hypoxic respiratory failure 2/2 COVID-19, requiring trach placement. Following hospitalization, dx with vocal fold paralysis. 06/19/22 pt underwent vocal fold augmentation, c/o worsening voice needing more energy to produce voice.   PAIN:  Are you having pain? Yes: Pain location: above the substernal notch Pain description: 8  FALLS: Has patient fallen in last 6 months? No,   LIVING ENVIRONMENT: Lives with: lives with their  family Lives in: House/apartment  PLOF:Level of assistance: Independent with ADLs Employment: Retired  PATIENT GOALS: Improve quality and function of voice  OBJECTIVE:  Note: Objective measures were completed at Evaluation unless otherwise noted.  DIAGNOSTIC FINDINGS: Flexible Fiberoptic Videostroboscopy  - Nasal cavity - There was no pus or polyps noted in the nasal cavity. Septum intact - Nasopharynx/Oropharynx - there was no evidence of mass or lesion. - Hypopharynx - There were no lesions in the pyriformis, pharyngeal walls, or postcricoid space - Larynx - there was mild interarytenoid edema, no erythema. No lesions of the epiglottis, false folds, or aryepiglottic folds - Vocal Folds -  Mobility: Right VF immobility Amplitude: Symmetric bilaterally Mucosal Wave: Mucosal wave intact bilaterally Closure: Complete Lesions/Other findings: muscle tension dysphonia  - Subglottis - Normal   ENT Assessment:  Findings and diagnoses include: - dysphonia - Right VF immobility (post hospitilization/intubation/trach 2/2 covid 2021) s/p in office Restylene injection 06/19/22 - MTD S/p 3 sessions of voice therapy with mild benefit.  S/p MDL with injection augmentation in OR with restylane 0.9ml right vocal fold   COGNITION: Overall cognitive status: Within functional limits for tasks assessed  SOCIAL HISTORY: Occupation: Retired Counsellor intake: suboptimal Caffeine/alcohol intake: minimal Daily voice use: moderate  On a scale of 1-5 where 1= very little and 5= excessive, how would you characterize your daily average voice use?  4 On a scale of 0-5, where 0= never and 5= always, how often do you shout or scream?  4 Surgical history:  06/19/22 pt underwent vocal fold augmentation  PHYSICAL SYMPTOMS: Do you have any burning, soreness, tickling, or irritation in your throat? Yes after completing voice exercises from previous SLP Do you sometimes have a sensation of a lump in your throat?   No Do you have any aching or tightness in your throat? Yes Do you ever feel tension in your neck area? Yes Does your voice get tired easily? Yes Do you feel as if you have to strain to produce voice? Yes Do you feel as if you need to cough or clear your throat a lot? Yes Do you ever lose your voice completely? Yes Do you have difficulty projecting your voice? Yes  VOCAL ABUSE:  Episodes observed during evaluation: Vocal strain, cough Characteristics observed during evaluation: dry, rough, cough Pt report of frequency: all day throughout the day Pt report of duration: extensive long lasting coughing episodes   PERCEPTUAL VOICE ASSESSMENT: Voice quality: hoarse, harsh, rough, strained, low vocal intensity, and vocal fatigue Vocal abuse:  habitual cough, vocal straining , habitual throat clear Resonance: normal Respiratory function: clavicular breathing and diaphragmatic/abdominal breathing  OBJECTIVE VOICE ASSESSMENT: Maximum phonation time for sustained "ah": 4 seconds, "ee" 6 seconds, "oh" 8 seconds Conversational loudness average: 65 dB Conversational loudness range: 63-68 dB S/z ratio: S 8 seconds/ Z 3 seconds (Suggestive of dysfunction >1.0)  STIMULIBILITY TRIALS FOR IMPROVED VOCAL QUALITY: Vocal quality improved during structured exercises with model and verbal cues from ST,   PATIENT REPORTED OUTCOME  MEASURES (PROM): Voice Related Quality of Life Measure Pt was asked to rate on a scale of 1-5 how much of a problem different speaking situations have been over the past two weeks     Because of my voice...  Pt Rating  I have trouble speaking loudly or being heard in noisy situations 4  I run out of air and need to take frequent breaths when talking 5  I sometimes do not know what will come out when I begin speaking 4  I am sometimes anxious or frustrated because of my voice 5  I sometimes get depressed because of my voice 4  I have trouble using the telephone because of my  voice 4  I have trouble doing my job or practicing my profession because of my voice 4  I avoid going out socially because of my voice 4  I have to repeat myself to be understood 5  I have become less outgoing because of my voice 4  1= none, not a problem 5= problem is as "bad as it can be"  >80 calculated score is considered WNL   Calculated Score = poor   TODAY'S TREATMENT:                                                                                                                                         03/04/2023: Collaborated with pt to generate POC and goals. Education provided on vocal hygiene recommendations.    PATIENT EDUCATION: Education details: see tx section Person educated: Patient Education method: Explanation, Demonstration, and Handouts Education comprehension: verbalized understanding, returned demonstration, and needs further education  HOME EXERCISE PROGRAM: cough/throat clear strategies, semi-occluded vocal tract exercises, resonant voice therapy  GOALS: Goals reviewed with patient? Yes  SHORT TERM GOALS: Target date: 04/30/2023    Pt will accurately complete voice exercises to address muscle tension with model and mod-A   Baseline: Goal status: INITIAL  2.  Pt will report carryover of HEP 5/7 days per week across 2/2 sessions Baseline:  Goal status: INITIAL  3.  Pt will use clear, forward focused voice at the sentence level during structured activities with mod-A Baseline:  Goal status: INITIAL  4.  Pt will successfully avoid cough/throat clear using strategies in 80% of opportunities across 2/2 sessions Baseline:  Goal status: INITIAL   LONG TERM GOALS: Target date: ***  Pt report improvements in voice via PROM. Baseline:  Goal status: INITIAL  2.  Pt will carryover clear, forward focused voice in conversation given min-A Baseline:  Goal status: INITIAL  3.  Pt will reduce cough/throat clear by 80% by LTG date Baseline:  Goal  status: INITIAL  4.  *** Baseline:  Goal status: INITIAL  5.  *** Baseline:  Goal status: INITIAL   ASSESSMENT:  CLINICAL IMPRESSION: Patient is a 58 y.o. M who was seen today for voice evaluation. Evaluation reveals severe  dysphonia. Pt's voice is c/b coughing, throat clearing, vocal fold paralysis, straining, and extra exertion. Pt reports voice is having a noticeable impact on his QoL. Pt presents with rough, strained, hoarse vocal quality and vocal tension. Pt would benefit from skilled ST to address aforementioned deficits to optimize voice usage for enhanced communication efficacy.     OBJECTIVE IMPAIRMENTS: include voice disorder. These impairments are limiting patient from effectively communicating at home and in community. Factors affecting potential to achieve goals and functional outcome are co-morbidities and cooperation/participation level. Patient will benefit from skilled SLP services to address above impairments and improve overall function.  REHAB POTENTIAL: Good  PLAN:  SLP FREQUENCY: 1x/week  SLP DURATION: 8 weeks  PLANNED INTERVENTIONS: Environmental controls, Cueing hierachy, Functional tasks, SLP instruction and feedback, Compensatory strategies, Patient/family education, and [id number] Treatment of speech (30 or 45 min)     Maia Breslow, CCC-SLP 03/04/2023, 3:18 PM   Check all possible CPT codes: [phone] - SLP treatment    Check all conditions that are expected to impact treatment: None of these apply   If treatment provided at initial evaluation, no treatment charged due to lack of authorization.

## 2023-03-01 ENCOUNTER — Other Ambulatory Visit (HOSPITAL_COMMUNITY): Payer: Self-pay

## 2023-03-04 ENCOUNTER — Encounter: Payer: Self-pay | Admitting: Speech Pathology

## 2023-03-04 ENCOUNTER — Ambulatory Visit: Payer: Medicare Other | Attending: Neurosurgery | Admitting: Speech Pathology

## 2023-03-04 DIAGNOSIS — R498 Other voice and resonance disorders: Secondary | ICD-10-CM | POA: Insufficient documentation

## 2023-03-05 ENCOUNTER — Other Ambulatory Visit (HOSPITAL_COMMUNITY): Payer: Self-pay

## 2023-03-11 ENCOUNTER — Encounter: Payer: Medicare Other | Admitting: Speech Pathology

## 2023-03-11 ENCOUNTER — Other Ambulatory Visit: Payer: Self-pay | Admitting: Pharmacist

## 2023-03-11 NOTE — Progress Notes (Signed)
   03/11/2023  Patient ID: Andrew Bruce, male   DOB: 1965/04/04, 58 y.o.   MRN: [id number]  Called and spoke with the patient on the phone today. He reports doing well with no additional concerns at this time. Would prefer to get his medications the same way as last month and with the same contents. Plans to get from the pharmacy on Friday. Will ensure this is set-up by Thursday and filled for pick-up. Thanked for assistance!  Update from 03/13/23:  - Called pharmacy and they plan to get his medications refilled for tomorrow - Aware to fill with special pricing   [name], PharmD [facility] Phone Number: [phone]

## 2023-03-13 ENCOUNTER — Other Ambulatory Visit: Payer: Self-pay

## 2023-03-14 ENCOUNTER — Other Ambulatory Visit: Payer: Self-pay

## 2023-03-18 ENCOUNTER — Encounter: Payer: Medicare Other | Admitting: Speech Pathology

## 2023-03-25 ENCOUNTER — Ambulatory Visit: Payer: Medicare Other | Attending: Neurosurgery | Admitting: Speech Pathology

## 2023-03-25 DIAGNOSIS — R498 Other voice and resonance disorders: Secondary | ICD-10-CM | POA: Diagnosis not present

## 2023-03-25 NOTE — Therapy (Unsigned)
 OUTPATIENT SPEECH LANGUAGE PATHOLOGY VOICE TREATMENT   Patient Name: Andrew Bruce MRN: [id number] DOB:Jul 28, 1965, 58 y.o., male Today's Date: 03/25/2023  PCP: [name], NP REFERRING PROVIDER: Christene Lye, MD  END OF SESSION:  End of Session - 03/25/23 1528     Visit Number 2    Number of Visits 9    Date for SLP Re-Evaluation 04/29/23    Authorization Type Medicare A & B    SLP Start Time 1530    SLP Stop Time  1615    SLP Time Calculation (min) 45 min    Activity Tolerance Patient tolerated treatment well              Past Medical History:  Diagnosis Date   COVID-19 02/06/2019   Diabetes mellitus without complication (HCC)    Hypertension    PE (pulmonary thromboembolism) (HCC)    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   Pneumonia    with Covid   Stroke (HCC)    weakness on right side, occurred while he had Covid   Past Surgical History:  Procedure Laterality Date   AMPUTATION Right 08/13/2019   Procedure: AMPUTATION RIGHT FIRST AND SECOND TOES;  Surgeon: Larina Earthly, MD;  Location: [facility] OR;  Service: Vascular;  Laterality: Right;   DIRECT LARYNGOSCOPY Bilateral 04/06/2019   Procedure: MICRO DIRECT LARYNGOSCOPY WITH PROLARYN INJECTION;  Surgeon: Christia Reading, MD;  Location: [facility] OR;  Service: ENT;  Laterality: Bilateral;   IR GASTROSTOMY TUBE MOD SED  03/10/2019   IR GASTROSTOMY TUBE REMOVAL  04/20/2019   WOUND DEBRIDEMENT Left 08/13/2019   Procedure: AMPUTATION  OF LEFT FIRST TOE, AND DEBRIDEMENT OF LEFT SECOND, THIRD AND FOURTH TOES;  Surgeon: Larina Earthly, MD;  Location: [facility] OR;  Service: Vascular;  Laterality: Left;   Patient Active Problem List   Diagnosis Date Noted   Chronic pain syndrome 11/11/2022   Type 2 diabetes, controlled, with ulcer of toe (HCC) 09/27/2021   Anxiety 07/20/2021   COVID-19 long hauler 07/20/2021   Depression 07/20/2021   History of severe acute respiratory syndrome coronavirus 2 (SARS-CoV-2) disease  07/20/2021   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   Nerve pain 12/15/2020   Diplopia 10/20/2020   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 07/14/2020   Uncontrolled hypertension 02/18/2020   Impaired functional mobility, balance, gait, and endurance 08/23/2019   Amputation of great toe (HCC) 08/23/2019   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   Impaired ambulation 06/11/2019   Dependent on walker for ambulation 04/30/2019   Right spastic hemiparesis (HCC) 04/30/2019   Tachycardia 04/30/2019   Dependence on cane 04/30/2019   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   Acute blood loss anemia    Uncontrolled type 2 diabetes mellitus with hyperglycemia (HCC)    Dry gangrene (HCC)    Leukocytosis    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   Status post tracheostomy (HCC)    Pressure injury of skin 03/02/2019   On mechanically assisted ventilation (HCC)    Goals of care, counseling/discussion    Palliative care encounter    ARDS (adult respiratory distress syndrome) (HCC)    Cerebral embolism with cerebral infarction 02/17/2019   Acute respiratory failure with hypoxia (HCC)    Pneumothorax, right     Onset date: 02/06/2023  REFERRING DIAG: R49.0   THERAPY DIAG:  Other voice and resonance disorders  Rationale for Evaluation and Treatment: Rehabilitation  SUBJECTIVE:  SUBJECTIVE STATEMENT: "Feels a little tight still" Pt accompanied by: interpreter: Vladimir Faster  PERTINENT HISTORY: 2021 hypoxic respiratory failure 2/2 COVID-19, requiring trach placement. Following hospitalization, dx with vocal fold paralysis. 06/19/22 pt underwent vocal fold augmentation, c/o worsening voice needing more energy to produce voice.   PAIN:  Are you having pain? Yes: Pain location: above the substernal notch Pain description: 8  FALLS: Has patient fallen in last 6 months? No,   LIVING ENVIRONMENT: Lives with: lives with their family Lives  in: House/apartment  PLOF:Level of assistance: Independent with ADLs Employment: Retired  PATIENT GOALS: Improve quality and function of voice  OBJECTIVE:  Note: Objective measures were completed at Evaluation unless otherwise noted.  TODAY'S TREATMENT:                                                                                                                                         03/25/23: SLP initated semi occluded vocal fold exercises (SOVTE) with pt participating effectively in initial breathing technique, but displaying increased tension and back focused "throaty" vocal quality during voiced exercises. SLP transitioned guiding pt through resonant voice exercises to facilitate forward focused voice and reduce tension. Some improvement noted with hum with pt benefiting from SLP model and mod/max-A. SLP then initiated vocal trill providing a pt model whilst problem solving different techniques to improve quality of voice. Pt was successful completing trill with notably improved vocal quality given max/mod-A. Poor breath support noted during vocal exercises. SLP conducted assessment of MEP for EMST to target. Pt's maximum expiratory pressure today was 105 cm H2O. EMST 150 device was started at 75% MEP, pt unable to sustain appropriate force for completion of exercises. Modified, with device set 60 cm H2O. SLP provided demonstration and verbal instruction to utilize EMST device. Pt completed 5 repetitions over 5 sets during our session. HEP initiated for 5 sets of 5 reps for 5 days/week. SLP ended session guiding pt through abdominal breathing exercises for relaxation and provided handout for practice at home.   03/04/2023: Collaborated with pt to generate POC and goals. Education provided on vocal hygiene recommendations.    PATIENT EDUCATION: Education details: see tx section Person educated: Patient Education method: Explanation, Demonstration, and Handouts Education comprehension:  verbalized understanding, returned demonstration, and needs further education  HOME EXERCISE PROGRAM: cough/throat clear strategies, semi-occluded vocal tract exercises, resonant voice therapy  GOALS: Goals reviewed with patient? Yes  SHORT TERM GOALS: Target date: 04/03/2023    Pt will accurately complete voice exercises to address muscle tension with model and mod-A   Baseline: Goal status: INITIAL  2.  Pt will report carryover of HEP 5/7 days per week across 2/2 sessions Baseline:  Goal status: INITIAL  3.  Pt will use clear, forward focused voice at the sentence level during structured activities with mod-A Baseline:  Goal status: INITIAL  4.  Pt will successfully avoid cough/throat clear using  strategies in 80% of opportunities across 2/2 sessions Baseline:  Goal status: INITIAL   LONG TERM GOALS: Target date: 04/29/2023  Pt report improvements in voice via PROM. Baseline:  Goal status: INITIAL  2.  Pt will carryover clear, forward focused voice in 8 minute conversation given min-A Baseline:  Goal status: INITIAL  3.  Pt will report reduced cough/throat clear by 50% by LTG date Baseline:  Goal status: INITIAL  4.  Pt will accurately complete voice exercises with mod-I   Baseline:  Goal status: INITIAL   ASSESSMENT:  CLINICAL IMPRESSION: Patient is a 58 y.o. M who was seen today for voice treatment. 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 Pt's voice is c/b stained, hoarse vocal quality with occasional aphonia. Pt presents with vocal abuse via coughing, throat clearing, straining, and extra exertion. 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 Pt reports voice is having a noticeable impact on his QoL. He is avoiding social situations, is embarrassed by his voice and has trouble speaking on the telephone. SLP  initiated pt education on breathing techniques, vocal exercises, and provided instruction on EMST device. Pt continuing to benefit from skilled ST to optimize voice usage and increase breath support for enhanced communication efficacy.     OBJECTIVE IMPAIRMENTS: include voice disorder. These impairments are limiting patient from effectively communicating at home and in community. Factors affecting potential to achieve goals and functional outcome are co-morbidities and cooperation/participation level. Patient will benefit from skilled SLP services to address above impairments and improve overall function.  REHAB POTENTIAL: Good  PLAN:  SLP FREQUENCY: 1x/week  SLP DURATION: 8 weeks  PLANNED INTERVENTIONS: Environmental controls, Cueing hierachy, Functional tasks, SLP instruction and feedback, Compensatory strategies, Patient/family education, and 16109 Treatment of speech (30 or 45 min)   Graduate clinician Roylene Reason present for this session and participated in administration of therapeutic interventions under my supervision.   Emely Fahy, Student-SLP 03/25/2023, 3:28 PM   Check all possible CPT codes: 60454 - SLP treatment    Check all conditions that are expected to impact treatment: None of these apply   If treatment provided at initial evaluation, no treatment charged due to lack of authorization.

## 2023-03-28 ENCOUNTER — Ambulatory Visit: Payer: Self-pay | Admitting: Nurse Practitioner

## 2023-04-01 ENCOUNTER — Ambulatory Visit: Payer: Medicare Other | Admitting: Speech Pathology

## 2023-04-01 DIAGNOSIS — R498 Other voice and resonance disorders: Secondary | ICD-10-CM

## 2023-04-01 NOTE — Patient Instructions (Signed)
  Cmo ests?  Estoy bien.  Adnde vas?  Quin estaba al telfono?  Tengo prisa.  Es hora de Higher education careers adviser.  Me entiendes?  Qu hay de nuevo?  Disculpa.  Ests listo?  Tienes hambre?  Quin estaba en la puerta?  No importa.

## 2023-04-01 NOTE — Therapy (Signed)
 OUTPATIENT SPEECH LANGUAGE PATHOLOGY VOICE TREATMENT   Patient Name: Andrew Bruce MRN: [id number] DOB:[birth year], 58 y.o., male Today's Date: 04/01/2023  PCP: [name], NP REFERRING PROVIDER: Christene Lye, MD  END OF SESSION:  End of Session - 04/01/23 1532     Visit Number 3    Number of Visits 9    Date for SLP Re-Evaluation 04/29/23    Authorization Type Medicare A & B    SLP Start Time 1533    SLP Stop Time  1615    SLP Time Calculation (min) 42 min    Activity Tolerance Patient tolerated treatment well               Past Medical History:  Diagnosis Date   COVID-19 02/06/2019   Diabetes mellitus without complication (HCC)    Hypertension    PE (pulmonary thromboembolism) (HCC)    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   Pneumonia    with Covid   Stroke (HCC)    weakness on right side, occurred while he had Covid   Past Surgical History:  Procedure Laterality Date   AMPUTATION Right 08/13/2019   Procedure: AMPUTATION RIGHT FIRST AND SECOND TOES;  Surgeon: Larina Earthly, MD;  Location: [facility] OR;  Service: Vascular;  Laterality: Right;   DIRECT LARYNGOSCOPY Bilateral 04/06/2019   Procedure: MICRO DIRECT LARYNGOSCOPY WITH PROLARYN INJECTION;  Surgeon: Christia Reading, MD;  Location: [facility] OR;  Service: ENT;  Laterality: Bilateral;   IR GASTROSTOMY TUBE MOD SED  03/10/2019   IR GASTROSTOMY TUBE REMOVAL  04/20/2019   WOUND DEBRIDEMENT Left 08/13/2019   Procedure: AMPUTATION  OF LEFT FIRST TOE, AND DEBRIDEMENT OF LEFT SECOND, THIRD AND FOURTH TOES;  Surgeon: Larina Earthly, MD;  Location: [facility] OR;  Service: Vascular;  Laterality: Left;   Patient Active Problem List   Diagnosis Date Noted   Chronic pain syndrome 11/11/2022   Type 2 diabetes, controlled, with ulcer of toe (HCC) 09/27/2021   Anxiety 07/20/2021   COVID-19 long hauler 07/20/2021   Depression 07/20/2021   History of severe acute respiratory syndrome coronavirus 2 (SARS-CoV-2) disease  07/20/2021   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   Nerve pain 12/15/2020   Diplopia 10/20/2020   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 07/14/2020   Uncontrolled hypertension 02/18/2020   Impaired functional mobility, balance, gait, and endurance 08/23/2019   Amputation of great toe (HCC) 08/23/2019   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   Impaired ambulation 06/11/2019   Dependent on walker for ambulation 04/30/2019   Right spastic hemiparesis (HCC) 04/30/2019   Tachycardia 04/30/2019   Dependence on cane 04/30/2019   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   Acute blood loss anemia    Uncontrolled type 2 diabetes mellitus with hyperglycemia (HCC)    Dry gangrene (HCC)    Leukocytosis    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   Status post tracheostomy (HCC)    Pressure injury of skin 03/02/2019   On mechanically assisted ventilation (HCC)    Goals of care, counseling/discussion    Palliative care encounter    ARDS (adult respiratory distress syndrome) (HCC)    Cerebral embolism with cerebral infarction 02/17/2019   Acute respiratory failure with hypoxia (HCC)    Pneumothorax, right     Onset date: 02/06/2023  REFERRING DIAG: R49.0   THERAPY DIAG:  Other voice and resonance disorders  Rationale for Evaluation and Treatment: Rehabilitation  SUBJECTIVE:   SUBJECTIVE STATEMENT: Pt reported intermittent completion of HEP. Pt accompanied by: interpreter: Deysi  PERTINENT HISTORY: 2021 hypoxic respiratory failure 2/2 COVID-19, requiring trach placement. Following hospitalization, dx with vocal fold paralysis. 06/19/22 pt underwent vocal fold augmentation, c/o worsening voice needing more energy to produce voice.   PAIN:  Are you having pain? Yes: Pain location: above the substernal notch Pain description: 8  FALLS: Has patient fallen in last 6 months? No,   LIVING ENVIRONMENT: Lives with: lives with their  family Lives in: House/apartment  PLOF:Level of assistance: Independent with ADLs Employment: Retired  PATIENT GOALS: Improve quality and function of voice  OBJECTIVE:  Note: Objective measures were completed at Evaluation unless otherwise noted.  TODAY'S TREATMENT:                                                                                                                                         04/01/23: SLP initiated 5 sets of 5 reps of EMST device. Pt participated effectively with min-A and reported current level is still challenging for him. SLP initiated PhoRTE to work on Youth worker and endurance with pt participating effectively given usual model and mod/max cues. SLP provided pt education regarding EMST device and the importance of daily practice for improved breath support. Pt confirmed understanding and SLP provided pt handout and updated HEP.  03/25/23: SLP initated semi occluded vocal fold exercises (SOVTE) with pt participating effectively in initial breathing technique, but displaying increased tension and back focused "throaty" vocal quality during voiced exercises. SLP transitioned guiding pt through resonant voice exercises to facilitate forward focused voice and reduce tension. Some improvement noted with hum with pt benefiting from SLP model and mod/max-A. SLP then initiated vocal trill providing a pt model whilst problem solving different techniques to improve quality of voice. Pt was successful completing trill with notably improved vocal quality given max/mod-A. Poor breath support noted during vocal exercises. SLP conducted assessment of MEP for EMST to target. Pt's maximum expiratory pressure today was 105 cm H2O. EMST 150 device was started at 75% MEP, pt unable to sustain appropriate force for completion of exercises. Modified, with device set 60 cm H2O. SLP provided demonstration and verbal instruction to utilize EMST device. Pt completed 5 repetitions over 5 sets  during our session. HEP initiated for 5 sets of 5 reps for 5 days/week. SLP ended session guiding pt through abdominal breathing exercises for relaxation and provided handout for practice at home.   03/04/2023: Collaborated with pt to generate POC and goals. Education provided on vocal hygiene recommendations.    PATIENT EDUCATION: Education details: see tx section Person educated: Patient Education method: Explanation, Demonstration, and Handouts Education comprehension: verbalized understanding, returned demonstration, and needs further education  HOME EXERCISE PROGRAM: cough/throat clear strategies, breathing exercises, PhoRTE  GOALS: Goals reviewed with patient? Yes  SHORT TERM GOALS: Target date: 04/03/2023    Pt  will accurately complete voice exercises to address muscle tension with model and mod-A   Baseline: Goal status: INITIAL  2.  Pt will report carryover of HEP 5/7 days per week across 2/2 sessions Baseline:  Goal status: INITIAL  3.  Pt will use clear, forward focused voice at the sentence level during structured activities with mod-A Baseline:  Goal status: INITIAL  4.  Pt will successfully avoid cough/throat clear using strategies in 80% of opportunities across 2/2 sessions Baseline:  Goal status: INITIAL   LONG TERM GOALS: Target date: 04/29/2023  Pt report improvements in voice via PROM. Baseline:  Goal status: INITIAL  2.  Pt will carryover clear, forward focused voice in 8 minute conversation given min-A Baseline:  Goal status: INITIAL  3.  Pt will report reduced cough/throat clear by 50% by LTG date Baseline:  Goal status: INITIAL  4.  Pt will accurately complete voice exercises with mod-I   Baseline:  Goal status: INITIAL   ASSESSMENT:  CLINICAL IMPRESSION: Patient is a 58 y.o. M who was seen today for voice treatment. 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 Pt's voice is c/b stained, hoarse vocal quality with occasional aphonia. Pt presents  with vocal abuse via coughing, throat clearing, straining, and extra exertion. 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 Pt reports voice is having a noticeable impact on his QoL. He is avoiding social situations, is embarrassed by his voice and has trouble speaking on the telephone. SLP initiated pt education on high intensity vocal exercises for improved endurance, and provided additional instruction on EMST device. Pt continuing to benefit from skilled ST to optimize voice usage and increase breath support for enhanced communication efficacy.     OBJECTIVE IMPAIRMENTS: include voice disorder. These impairments are limiting patient from effectively communicating at home and in community. Factors affecting potential to achieve goals and functional outcome are co-morbidities and cooperation/participation level. Patient will benefit from skilled SLP services to address above impairments and improve overall function.  REHAB POTENTIAL: Good  PLAN:  SLP FREQUENCY: 1x/week  SLP DURATION: 8 weeks  PLANNED INTERVENTIONS: Environmental controls, Cueing hierachy, Functional tasks, SLP instruction and feedback, Compensatory strategies, Patient/family education, and 41324 Treatment of speech (30 or 45 min)   Graduate clinician Roylene Reason present for this session and participated in administration of therapeutic interventions under my supervision.   Adine Heimann, Student-SLP 04/01/2023, 3:33 PM   Check all possible CPT codes: 40102 - SLP treatment    Check all conditions that are expected to impact treatment: None of these apply   If treatment provided at initial evaluation, no treatment charged due to lack of authorization.

## 2023-04-08 ENCOUNTER — Ambulatory Visit: Payer: Medicare Other | Admitting: Speech Pathology

## 2023-04-08 DIAGNOSIS — R498 Other voice and resonance disorders: Secondary | ICD-10-CM | POA: Diagnosis not present

## 2023-04-08 NOTE — Therapy (Unsigned)
 OUTPATIENT SPEECH LANGUAGE PATHOLOGY VOICE TREATMENT   Patient Name: Andrew Bruce MRN: [id number] DOB:1965-01-30, 58 y.o., male Today's Date: 04/08/2023  PCP: [name], NP REFERRING PROVIDER: Christene Lye, MD  END OF SESSION:  End of Session - 04/08/23 1521     Visit Number 4    Number of Visits 9    Date for SLP Re-Evaluation 04/29/23    Authorization Type Medicare A & B    SLP Start Time 1530    SLP Stop Time  1615    SLP Time Calculation (min) 45 min    Activity Tolerance Patient tolerated treatment well              Past Medical History:  Diagnosis Date   COVID-19 02/06/2019   Diabetes mellitus without complication (HCC)    Hypertension    PE (pulmonary thromboembolism) (HCC)    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   Pneumonia    with Covid   Stroke (HCC)    weakness on right side, occurred while he had Covid   Past Surgical History:  Procedure Laterality Date   AMPUTATION Right 08/13/2019   Procedure: AMPUTATION RIGHT FIRST AND SECOND TOES;  Surgeon: Larina Earthly, MD;  Location: [facility] OR;  Service: Vascular;  Laterality: Right;   DIRECT LARYNGOSCOPY Bilateral 04/06/2019   Procedure: MICRO DIRECT LARYNGOSCOPY WITH PROLARYN INJECTION;  Surgeon: Christia Reading, MD;  Location: [facility] OR;  Service: ENT;  Laterality: Bilateral;   IR GASTROSTOMY TUBE MOD SED  03/10/2019   IR GASTROSTOMY TUBE REMOVAL  04/20/2019   WOUND DEBRIDEMENT Left 08/13/2019   Procedure: AMPUTATION  OF LEFT FIRST TOE, AND DEBRIDEMENT OF LEFT SECOND, THIRD AND FOURTH TOES;  Surgeon: Larina Earthly, MD;  Location: [facility] OR;  Service: Vascular;  Laterality: Left;   Patient Active Problem List   Diagnosis Date Noted   Chronic pain syndrome 11/11/2022   Type 2 diabetes, controlled, with ulcer of toe (HCC) 09/27/2021   Anxiety 07/20/2021   COVID-19 long hauler 07/20/2021   Depression 07/20/2021   History of severe acute respiratory syndrome coronavirus 2 (SARS-CoV-2) disease  07/20/2021   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   Nerve pain 12/15/2020   Diplopia 10/20/2020   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 07/14/2020   Uncontrolled hypertension 02/18/2020   Impaired functional mobility, balance, gait, and endurance 08/23/2019   Amputation of great toe (HCC) 08/23/2019   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   Impaired ambulation 06/11/2019   Dependent on walker for ambulation 04/30/2019   Right spastic hemiparesis (HCC) 04/30/2019   Tachycardia 04/30/2019   Dependence on cane 04/30/2019   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   Acute blood loss anemia    Uncontrolled type 2 diabetes mellitus with hyperglycemia (HCC)    Dry gangrene (HCC)    Leukocytosis    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   Status post tracheostomy (HCC)    Pressure injury of skin 03/02/2019   On mechanically assisted ventilation (HCC)    Goals of care, counseling/discussion    Palliative care encounter    ARDS (adult respiratory distress syndrome) (HCC)    Cerebral embolism with cerebral infarction 02/17/2019   Acute respiratory failure with hypoxia (HCC)    Pneumothorax, right     Onset date: 02/06/2023  REFERRING DIAG: R49.0   THERAPY DIAG:  Other voice and resonance disorders  Rationale for Evaluation and Treatment: Rehabilitation  SUBJECTIVE:  SUBJECTIVE STATEMENT: Pt shared recording his voice at home and recognizing difference in "good" voice vs "scratchy" voice Pt accompanied by: interpreter: Darlina Rumpf  PERTINENT HISTORY: 2021 hypoxic respiratory failure 2/2 COVID-19, requiring trach placement. Following hospitalization, dx with vocal fold paralysis. 06/19/22 pt underwent vocal fold augmentation, c/o worsening voice needing more energy to produce voice.   PAIN:  Are you having pain? Yes: Pain location: above the substernal notch Pain description: 8  FALLS: Has patient fallen in last 6  months? No,   LIVING ENVIRONMENT: Lives with: lives with their family Lives in: House/apartment  PLOF:Level of assistance: Independent with ADLs Employment: Retired  PATIENT GOALS: Improve quality and function of voice  OBJECTIVE:  Note: Objective measures were completed at Evaluation unless otherwise noted.  TODAY'S TREATMENT:         04/08/23: SLP initiated 5 sets of 5 reps of EMST device. Pt participated effectively with mod-I rating the difficulty at an 8/10 therefore challenging but doable and appropriate for continued practice. SLP initiated Vocal Function Exercises to work on strengthening voice and endurance. Pt participated effectively given usual model and mod/max cues for breathing, pacing, and increased vocal intensity. Pt demonstrated clear "eee" with improved vocal quality in comparison to prior sessions. SLP initiated practice with functional phrases translated to spanish with pt achieving forward focused voice. Pt participated effectively given clinician model and mod/max-A. SLP provided biofeedback via audio recording to highlight the difference in vocal quality with pt confirming understanding and independently identifying the difference between vocal strain and a clear voice.  04/01/23: SLP initiated 5 sets of 5 reps of EMST device. Pt participated effectively with min-A and reported current level is still challenging for him. SLP initiated PhoRTE to work on Youth worker and endurance with pt participating effectively given usual model and mod/max cues. SLP provided pt education regarding EMST device and the importance of daily practice for improved breath support. Pt confirmed understanding and SLP provided pt handout and updated HEP.  03/25/23: SLP initated semi occluded vocal fold exercises (SOVTE) with pt participating effectively in initial breathing technique, but displaying increased tension and back focused "throaty" vocal quality during voiced exercises. SLP  transitioned guiding pt through resonant voice exercises to facilitate forward focused voice and reduce tension. Some improvement noted with hum with pt benefiting from SLP model and mod/max-A. SLP then initiated vocal trill providing a pt model whilst problem solving different techniques to improve quality of voice. Pt was successful completing trill with notably improved vocal quality given max/mod-A. Poor breath support noted during vocal exercises. SLP conducted assessment of MEP for EMST to target. Pt's maximum expiratory pressure today was 105 cm H2O. EMST 150 device was started at 75% MEP, pt unable to sustain appropriate force for completion of exercises. Modified, with device set 60 cm H2O. SLP provided demonstration and verbal instruction to utilize EMST device. Pt completed 5 repetitions over 5 sets during our session. HEP initiated for 5 sets of 5 reps for 5 days/week. SLP ended session guiding pt through abdominal breathing exercises for relaxation and provided handout for practice at home.   03/04/2023: Collaborated with pt to generate POC and goals. Education provided on vocal hygiene recommendations.    PATIENT EDUCATION: Education details: see tx section Person educated: Patient Education method: Explanation, Demonstration, and Handouts Education comprehension: verbalized understanding, returned demonstration, and needs further education  HOME EXERCISE PROGRAM: cough/throat clear strategies, breathing exercises, PhoRTE  GOALS: Goals reviewed with patient? Yes  SHORT TERM GOALS: Target date: 04/03/2023  Pt will accurately complete voice exercises to address muscle tension with model and mod-A   Baseline: Goal status: PARTIALLY MET  2.  Pt will report carryover of HEP 5/7 days per week across 2/2 sessions Baseline:  Goal status: IN PROGRESS  3.  Pt will use clear, forward focused voice at the sentence level during structured activities with mod-A Baseline:  Goal status:  IN PROGRESS  4.  Pt will successfully avoid cough/throat clear using strategies in 80% of opportunities across 2/2 sessions Baseline:  Goal status: IN PROGRESS   LONG TERM GOALS: Target date: 04/29/2023  Pt report improvements in voice via PROM. Baseline:  Goal status: INITIAL  2.  Pt will carryover clear, forward focused voice in 8 minute conversation given min-A Baseline:  Goal status: INITIAL  3.  Pt will report reduced cough/throat clear by 50% by LTG date Baseline:  Goal status: INITIAL  4.  Pt will accurately complete voice exercises with mod-I   Baseline:  Goal status: INITIAL   ASSESSMENT:  CLINICAL IMPRESSION: Patient is a 58 y.o. M who was seen today for voice treatment. 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 Pt's voice is c/b stained, hoarse vocal quality with occasional aphonia. Pt presents with vocal abuse via coughing, throat clearing, straining, and extra exertion. 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 Pt reports voice is having a noticeable impact on his QoL. He is avoiding social situations, is embarrassed by his voice and has trouble speaking on the telephone. SLP initiated vocal function exercises for improved endurance and increased vocal control, and guided pt through EMST. Pt motivated to participate and achieving improved vocal quality with cues for breathing and pacing during exercises. Pt continuing to benefit from skilled ST to optimize voice usage and increase breath support for enhanced communication efficacy.     OBJECTIVE IMPAIRMENTS: include voice disorder. These impairments are limiting patient from effectively communicating at home and in community. Factors affecting potential to achieve goals and functional outcome are co-morbidities and cooperation/participation level. Patient will  benefit from skilled SLP services to address above impairments and improve overall function.  REHAB POTENTIAL: Good  PLAN:  SLP FREQUENCY: 1x/week  SLP DURATION: 8 weeks  PLANNED INTERVENTIONS: Environmental controls, Cueing hierachy, Functional tasks, SLP instruction and feedback, Compensatory strategies, Patient/family education, and [address] Treatment of speech (30 or 45 min)   Graduate clinician Roylene Reason present for this session and participated in administration of therapeutic interventions under my supervision.   [employer], Student-SLP 04/08/2023, 3:21 PM   Check all possible CPT codes: 60454 - SLP treatment    Check all conditions that are expected to impact treatment: None of these apply   If treatment provided at initial evaluation, no treatment charged due to lack of authorization.

## 2023-04-15 ENCOUNTER — Ambulatory Visit: Payer: Self-pay | Attending: Neurosurgery | Admitting: Speech Pathology

## 2023-04-15 DIAGNOSIS — R498 Other voice and resonance disorders: Secondary | ICD-10-CM | POA: Insufficient documentation

## 2023-04-15 NOTE — Therapy (Deleted)
 OUTPATIENT SPEECH LANGUAGE PATHOLOGY VOICE TREATMENT   Patient Name: Andrew Bruce MRN: [id number] DOB:1965/11/13, 58 y.o., male Today's Date: 04/15/2023  PCP: [name], NP REFERRING PROVIDER: Christene Lye, MD  END OF SESSION:     Past Medical History:  Diagnosis Date   COVID-19 02/06/2019   Diabetes mellitus without complication (HCC)    Hypertension    PE (pulmonary thromboembolism) (HCC)    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   Pneumonia    with Covid   Stroke (HCC)    weakness on right side, occurred while he had Covid   Past Surgical History:  Procedure Laterality Date   AMPUTATION Right 08/13/2019   Procedure: AMPUTATION RIGHT FIRST AND SECOND TOES;  Surgeon: Larina Earthly, MD;  Location: [facility] OR;  Service: Vascular;  Laterality: Right;   DIRECT LARYNGOSCOPY Bilateral 04/06/2019   Procedure: MICRO DIRECT LARYNGOSCOPY WITH PROLARYN INJECTION;  Surgeon: Christia Reading, MD;  Location: [facility] OR;  Service: ENT;  Laterality: Bilateral;   IR GASTROSTOMY TUBE MOD SED  03/10/2019   IR GASTROSTOMY TUBE REMOVAL  04/20/2019   WOUND DEBRIDEMENT Left 08/13/2019   Procedure: AMPUTATION  OF LEFT FIRST TOE, AND DEBRIDEMENT OF LEFT SECOND, THIRD AND FOURTH TOES;  Surgeon: Larina Earthly, MD;  Location: [facility] OR;  Service: Vascular;  Laterality: Left;   Patient Active Problem List   Diagnosis Date Noted   Chronic pain syndrome 11/11/2022   Type 2 diabetes, controlled, with ulcer of toe (HCC) 09/27/2021   Anxiety 07/20/2021   COVID-19 long hauler 07/20/2021   Depression 07/20/2021   History of severe acute respiratory syndrome coronavirus 2 (SARS-CoV-2) disease 07/20/2021   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   Nerve pain 12/15/2020   Diplopia 10/20/2020   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 07/14/2020   Uncontrolled hypertension 02/18/2020   Impaired functional mobility, balance, gait, and endurance 08/23/2019    Amputation of great toe (HCC) 08/23/2019   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   Impaired ambulation 06/11/2019   Dependent on walker for ambulation 04/30/2019   Right spastic hemiparesis (HCC) 04/30/2019   Tachycardia 04/30/2019   Dependence on cane 04/30/2019   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   Acute blood loss anemia    Uncontrolled type 2 diabetes mellitus with hyperglycemia (HCC)    Dry gangrene (HCC)    Leukocytosis    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   Status post tracheostomy (HCC)    Pressure injury of skin 03/02/2019   On mechanically assisted ventilation (HCC)    Goals of care, counseling/discussion    Palliative care encounter    ARDS (adult respiratory distress syndrome) (HCC)    Cerebral embolism with cerebral infarction 02/17/2019   Acute respiratory failure with hypoxia (HCC)    Pneumothorax, right     Onset date: 02/06/2023  REFERRING DIAG: R49.0   THERAPY DIAG:  No diagnosis found.  Rationale for Evaluation and Treatment: Rehabilitation  SUBJECTIVE:   SUBJECTIVE STATEMENT: *** Pt accompanied by: interpreter: Alondra  PERTINENT HISTORY: 2021 hypoxic respiratory failure 2/2 COVID-19, requiring trach placement. Following hospitalization, dx with vocal fold paralysis. 06/19/22 pt underwent vocal fold augmentation, c/o worsening voice needing more energy to produce voice.   PAIN:  Are you having pain? Yes: Pain location: above the substernal notch Pain description: 8  FALLS: Has patient fallen in last 6 months? No,   LIVING ENVIRONMENT: Lives with: lives with their family  Lives in: House/apartment  PLOF:Level of assistance: Independent with ADLs Employment: Retired  PATIENT GOALS: Improve quality and function of voice  OBJECTIVE:  Note: Objective measures were completed at Evaluation unless otherwise noted.  TODAY'S TREATMENT:         04/15/23:  04/08/23: SLP initiated 5 sets of 5 reps of EMST device. Pt  participated effectively with mod-I rating the difficulty at an 8/10 therefore challenging but doable and appropriate for continued practice. SLP initiated Vocal Function Exercises to work on strengthening voice and endurance. Pt participated effectively given usual model and mod/max cues for breathing, pacing, and increased vocal intensity. Pt demonstrated clear "eee" with improved vocal quality in comparison to prior sessions. SLP initiated practice with functional phrases translated to spanish with pt achieving forward focused voice. Pt participated effectively given clinician model and mod/max-A. SLP provided biofeedback via audio recording to highlight the difference in vocal quality with pt confirming understanding and independently identifying the difference between vocal strain and a clear voice.  04/01/23: SLP initiated 5 sets of 5 reps of EMST device. Pt participated effectively with min-A and reported current level is still challenging for him. SLP initiated PhoRTE to work on Youth worker and endurance with pt participating effectively given usual model and mod/max cues. SLP provided pt education regarding EMST device and the importance of daily practice for improved breath support. Pt confirmed understanding and SLP provided pt handout and updated HEP.  03/25/23: SLP initated semi occluded vocal fold exercises (SOVTE) with pt participating effectively in initial breathing technique, but displaying increased tension and back focused "throaty" vocal quality during voiced exercises. SLP transitioned guiding pt through resonant voice exercises to facilitate forward focused voice and reduce tension. Some improvement noted with hum with pt benefiting from SLP model and mod/max-A. SLP then initiated vocal trill providing a pt model whilst problem solving different techniques to improve quality of voice. Pt was successful completing trill with notably improved vocal quality given max/mod-A. Poor breath  support noted during vocal exercises. SLP conducted assessment of MEP for EMST to target. Pt's maximum expiratory pressure today was 105 cm H2O. EMST 150 device was started at 75% MEP, pt unable to sustain appropriate force for completion of exercises. Modified, with device set 60 cm H2O. SLP provided demonstration and verbal instruction to utilize EMST device. Pt completed 5 repetitions over 5 sets during our session. HEP initiated for 5 sets of 5 reps for 5 days/week. SLP ended session guiding pt through abdominal breathing exercises for relaxation and provided handout for practice at home.   03/04/2023: Collaborated with pt to generate POC and goals. Education provided on vocal hygiene recommendations.    PATIENT EDUCATION: Education details: see tx section Person educated: Patient Education method: Explanation, Demonstration, and Handouts Education comprehension: verbalized understanding, returned demonstration, and needs further education  HOME EXERCISE PROGRAM: cough/throat clear strategies, breathing exercises, PhoRTE  GOALS: Goals reviewed with patient? Yes  SHORT TERM GOALS: Target date: 04/03/2023    Pt will accurately complete voice exercises to address muscle tension with model and mod-A   Baseline: Goal status: PARTIALLY MET  2.  Pt will report carryover of HEP 5/7 days per week across 2/2 sessions Baseline:  Goal status: IN PROGRESS  3.  Pt will use clear, forward focused voice at the sentence level during structured activities with mod-A Baseline:  Goal status: IN PROGRESS  4.  Pt will successfully avoid cough/throat clear using strategies in 80% of opportunities across 2/2 sessions Baseline:  Goal status:  IN PROGRESS   LONG TERM GOALS: Target date: 04/29/2023  Pt report improvements in voice via PROM. Baseline:  Goal status: INITIAL  2.  Pt will carryover clear, forward focused voice in 8 minute conversation given min-A Baseline:  Goal status: INITIAL  3.   Pt will report reduced cough/throat clear by 50% by LTG date Baseline:  Goal status: INITIAL  4.  Pt will accurately complete voice exercises with mod-I   Baseline:  Goal status: INITIAL   ASSESSMENT:  CLINICAL IMPRESSION: Patient is a 58 y.o. M who was seen today for voice treatment. 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 Pt's voice is c/b stained, hoarse vocal quality with occasional aphonia. Pt presents with vocal abuse via coughing, throat clearing, straining, and extra exertion. 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 Pt reports voice is having a noticeable impact on his QoL. He is avoiding social situations, is embarrassed by his voice and has trouble speaking on the telephone. SLP initiated vocal function exercises for improved endurance and increased vocal control, and guided pt through EMST. Pt motivated to participate and achieving improved vocal quality with cues for breathing and pacing during exercises. Pt continuing to benefit from skilled ST to optimize voice usage and increase breath support for enhanced communication efficacy.     OBJECTIVE IMPAIRMENTS: include voice disorder. These impairments are limiting patient from effectively communicating at home and in community. Factors affecting potential to achieve goals and functional outcome are co-morbidities and cooperation/participation level. Patient will benefit from skilled SLP services to address above impairments and improve overall function.  REHAB POTENTIAL: Good  PLAN:  SLP FREQUENCY: 1x/week  SLP DURATION: 8 weeks  PLANNED INTERVENTIONS: Environmental controls, Cueing hierachy, Functional tasks, SLP instruction and feedback, Compensatory strategies, Patient/family education, and 16[address] Treatment of speech (30 or 45 min)   Graduate clinician Roylene Reason present for this session and participated in administration of therapeutic interventions under my supervision.   Tramond Slinker, Student-SLP 04/15/2023, 7:50 AM   Check all possible CPT codes: 60454 - SLP treatment    Check all conditions that are expected to impact treatment: None of these apply   If treatment provided at initial evaluation, no treatment charged due to lack of authorization.

## 2023-04-16 ENCOUNTER — Other Ambulatory Visit: Payer: Self-pay

## 2023-04-16 ENCOUNTER — Other Ambulatory Visit: Payer: Self-pay | Admitting: Nurse Practitioner

## 2023-04-16 DIAGNOSIS — G6281 Critical illness polyneuropathy: Secondary | ICD-10-CM

## 2023-04-16 DIAGNOSIS — I10 Essential (primary) hypertension: Secondary | ICD-10-CM

## 2023-04-16 MED ORDER — AMLODIPINE BESYLATE 10 MG PO TABS
10.0000 mg | ORAL_TABLET | Freq: Every day | ORAL | 5 refills | Status: DC
  Filled 2023-04-16: qty 30, 30d supply, fill #0

## 2023-04-16 MED ORDER — LISINOPRIL 40 MG PO TABS
40.0000 mg | ORAL_TABLET | Freq: Every day | ORAL | 5 refills | Status: DC
  Filled 2023-04-16: qty 30, 30d supply, fill #0

## 2023-04-16 MED ORDER — PREGABALIN 150 MG PO CAPS
150.0000 mg | ORAL_CAPSULE | Freq: Three times a day (TID) | ORAL | 1 refills | Status: DC
  Filled 2023-04-16: qty 90, 30d supply, fill #0
  Filled 2023-07-21: qty 90, 30d supply, fill #1

## 2023-04-17 ENCOUNTER — Other Ambulatory Visit: Payer: Self-pay

## 2023-04-22 ENCOUNTER — Ambulatory Visit: Payer: Self-pay | Admitting: Speech Pathology

## 2023-04-22 DIAGNOSIS — R498 Other voice and resonance disorders: Secondary | ICD-10-CM | POA: Diagnosis not present

## 2023-04-22 NOTE — Therapy (Unsigned)
 OUTPATIENT SPEECH LANGUAGE PATHOLOGY VOICE TREATMENT   Patient Name: Andrew Bruce MRN: [id number] DOB:[birth year], 58 y.o., male Today's Date: 04/22/2023  PCP: [name], NP REFERRING PROVIDER: Christene Lye, MD  END OF SESSION:  End of Session - 04/22/23 1534     Visit Number 5    Number of Visits 9    Date for SLP Re-Evaluation 04/29/23    Authorization Type Medicare A & B    SLP Start Time 1534    SLP Stop Time  1615    SLP Time Calculation (min) 41 min    Activity Tolerance Patient tolerated treatment well               Past Medical History:  Diagnosis Date   COVID-19 02/06/2019   Diabetes mellitus without complication (HCC)    Hypertension    PE (pulmonary thromboembolism) (HCC)    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   Pneumonia    with Covid   Stroke (HCC)    weakness on right side, occurred while he had Covid   Past Surgical History:  Procedure Laterality Date   AMPUTATION Right 08/13/2019   Procedure: AMPUTATION RIGHT FIRST AND SECOND TOES;  Surgeon: Larina Earthly, MD;  Location: [facility] OR;  Service: Vascular;  Laterality: Right;   DIRECT LARYNGOSCOPY Bilateral 04/06/2019   Procedure: MICRO DIRECT LARYNGOSCOPY WITH PROLARYN INJECTION;  Surgeon: Christia Reading, MD;  Location: [facility] OR;  Service: ENT;  Laterality: Bilateral;   IR GASTROSTOMY TUBE MOD SED  03/10/2019   IR GASTROSTOMY TUBE REMOVAL  04/20/2019   WOUND DEBRIDEMENT Left 08/13/2019   Procedure: AMPUTATION  OF LEFT FIRST TOE, AND DEBRIDEMENT OF LEFT SECOND, THIRD AND FOURTH TOES;  Surgeon: Larina Earthly, MD;  Location: [facility] OR;  Service: Vascular;  Laterality: Left;   Patient Active Problem List   Diagnosis Date Noted   Chronic pain syndrome 11/11/2022   Type 2 diabetes, controlled, with ulcer of toe (HCC) 09/27/2021   Anxiety 07/20/2021   COVID-19 long hauler 07/20/2021   Depression 07/20/2021   History of severe acute respiratory syndrome coronavirus 2 (SARS-CoV-2) disease  07/20/2021   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   Nerve pain 12/15/2020   Diplopia 10/20/2020   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 07/14/2020   Uncontrolled hypertension 02/18/2020   Impaired functional mobility, balance, gait, and endurance 08/23/2019   Amputation of great toe (HCC) 08/23/2019   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   Impaired ambulation 06/11/2019   Dependent on walker for ambulation 04/30/2019   Right spastic hemiparesis (HCC) 04/30/2019   Tachycardia 04/30/2019   Dependence on cane 04/30/2019   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   Acute blood loss anemia    Uncontrolled type 2 diabetes mellitus with hyperglycemia (HCC)    Dry gangrene (HCC)    Leukocytosis    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   Status post tracheostomy (HCC)    Pressure injury of skin 03/02/2019   On mechanically assisted ventilation (HCC)    Goals of care, counseling/discussion    Palliative care encounter    ARDS (adult respiratory distress syndrome) (HCC)    Cerebral embolism with cerebral infarction 02/17/2019   Acute respiratory failure with hypoxia (HCC)    Pneumothorax, right     Onset date: 02/06/2023  REFERRING DIAG: R49.0   THERAPY DIAG:  Other voice and resonance disorders  Rationale for Evaluation and Treatment: Rehabilitation  SUBJECTIVE:   SUBJECTIVE STATEMENT: "I was sick last week" Pt reports resuming consistent daily HEP practice after recovering from illness last Thursday. Pt accompanied by: interpreter: Milly  PERTINENT HISTORY: 2021 hypoxic respiratory failure 2/2 COVID-19, requiring trach placement. Following hospitalization, dx with vocal fold paralysis. 06/19/22 pt underwent vocal fold augmentation, c/o worsening voice needing more energy to produce voice.   PAIN:  Are you having pain? Yes: Pain location: above the substernal notch Pain description: 8  FALLS: Has patient fallen  in last 6 months? No,   LIVING ENVIRONMENT: Lives with: lives with their family Lives in: House/apartment  PLOF:Level of assistance: Independent with ADLs Employment: Retired  PATIENT GOALS: Improve quality and function of voice  OBJECTIVE:  Note: Objective measures were completed at Evaluation unless otherwise noted.  TODAY'S TREATMENT:         04/22/23: SLP initiated 5 sets of 5 repetitions of EMST device with pt completing given mod-I. Pt reports current cm H2O still presents a challenge. Pt plans to continue daily practice. SLP initiated VFE's with pt achieving improved vocal quality producing "eee" given cues to breathe in and use a clear voice. SLP then led pt through negative practice activity with functional phrases. Biofeedback provided to aid pt understanding and bring awareness to what practices promote "clear speech." Pt achieving improved vocal quality with mod-A during structured task and endorsing hearing the difference when listening back to recordings of negative practice. SLP provided pt education on the importance of pacing when speaking in conversation to avoid "running out of air" and facilitate improved breath support. Pt reporting he just wants to push out everything he has to say before he runs out of air. SLP advising taking breaths at natural pauses to avoid "pushing out" our voice. Cog deficits suspected d/t pt inconsistency and need for consistent re instruction to support appropriate completion of HEP. Pt agreeable to discharge.  04/08/23: SLP initiated 5 sets of 5 reps of EMST device. Pt participated effectively with mod-I rating the difficulty at an 8/10 therefore challenging but doable and appropriate for continued practice. SLP initiated Vocal Function Exercises to work on strengthening voice and endurance. Pt participated effectively given usual model and mod/max cues for breathing, pacing, and increased vocal intensity. Pt demonstrated clear "eee" with improved vocal  quality in comparison to prior sessions. SLP initiated practice with functional phrases translated to spanish with pt achieving forward focused voice. Pt participated effectively given clinician model and mod/max-A. SLP provided biofeedback via audio recording to highlight the difference in vocal quality with pt confirming understanding and independently identifying the difference between vocal strain and a clear voice.  04/01/23: SLP initiated 5 sets of 5 reps of EMST device. Pt participated effectively with min-A and reported current level is still challenging for him. SLP initiated PhoRTE to work on Youth worker and endurance with pt participating effectively given usual model and mod/max cues. SLP provided pt education regarding EMST device and the importance of daily practice for improved breath support. Pt confirmed understanding and SLP provided pt handout and updated HEP.  03/25/23: SLP initated semi occluded vocal fold exercises (SOVTE) with pt participating effectively in initial breathing technique, but displaying increased tension and back focused "throaty" vocal quality during voiced exercises. SLP transitioned guiding pt through resonant voice exercises to facilitate forward focused voice and reduce tension. Some improvement noted with hum with pt benefiting from SLP model and mod/max-A. SLP then initiated vocal trill providing a pt model whilst problem solving different techniques to  improve quality of voice. Pt was successful completing trill with notably improved vocal quality given max/mod-A. Poor breath support noted during vocal exercises. SLP conducted assessment of MEP for EMST to target. Pt's maximum expiratory pressure today was 105 cm H2O. EMST 150 device was started at 75% MEP, pt unable to sustain appropriate force for completion of exercises. Modified, with device set 60 cm H2O. SLP provided demonstration and verbal instruction to utilize EMST device. Pt completed 5 repetitions  over 5 sets during our session. HEP initiated for 5 sets of 5 reps for 5 days/week. SLP ended session guiding pt through abdominal breathing exercises for relaxation and provided handout for practice at home.   03/04/2023: Collaborated with pt to generate POC and goals. Education provided on vocal hygiene recommendations.    PATIENT EDUCATION: Education details: see tx section Person educated: Patient Education method: Explanation, Demonstration, and Handouts Education comprehension: verbalized understanding, returned demonstration, and needs further education  HOME EXERCISE PROGRAM: cough/throat clear strategies, breathing exercises, PhoRTE  GOALS: Goals reviewed with patient? Yes  SHORT TERM GOALS: Target date: 04/03/2023    Pt will accurately complete voice exercises to address muscle tension with model and mod-A   Baseline: Goal status: PARTIALLY MET  2.  Pt will report carryover of HEP 5/7 days per week across 2/2 sessions Baseline:  Goal status: MET  3.  Pt will use clear, forward focused voice at the sentence level during structured activities with mod-A Baseline:  Goal status: MET  4.  Pt will successfully avoid cough/throat clear using strategies in 80% of opportunities across 2/2 sessions Baseline:  Goal status: MET   LONG TERM GOALS: Target date: 04/29/2023  Pt report improvements in voice via PROM. Baseline:  Goal status: NOT MET  2.  Pt will carryover clear, forward focused voice in 8 minute conversation given min-A Baseline:  Goal status: NOT MET  3.  Pt will report reduced cough/throat clear by 50% by LTG date Baseline:  Goal status: PARTIALLY MET  4.  Pt will accurately complete voice exercises with mod-I   Baseline:  Goal status: PARTIALLY MET   ASSESSMENT:  CLINICAL IMPRESSION: Patient is a 58 y.o. M who was seen today for voice treatment. 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 Pt's voice is c/b stained, hoarse vocal quality with occasional  aphonia. Pt presents with vocal abuse via coughing, throat clearing, straining, and extra exertion. 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 Pt reports voice is having a noticeable impact on his QoL. He is avoiding social situations, is embarrassed by his voice and has trouble speaking on the telephone. SLP continued instruction of vocal function exercises for improved endurance and increased vocal control, and guided pt through EMST this date. Biofeedback provided of negative practice vs "clear voice." Pt endorsing recognition of the difference in vocal quality comparing the two audio recordings. Pt achieving improved vocal quality with cues for breathing and pacing during exercises. Pt reports plans to continue daily practice with HEP and is agreeable to discharge.  OBJECTIVE IMPAIRMENTS: include voice disorder. These impairments are limiting patient from effectively communicating at home and in community. Factors affecting potential to achieve goals and functional outcome are co-morbidities and cooperation/participation level. Patient will benefit from skilled SLP services to address above impairments and improve overall function.  REHAB POTENTIAL: Good  SPEECH THERAPY DISCHARGE SUMMARY  Visits from Start of Care:  5  Current functional level related to goals / functional outcomes: Pt completing EMST with mod-I and vocal exercises with mod-A, pt able to carry over improved vocal quality with structured phrases, suspected cognitive component impacting pt carryover, however evidence  improvements in pt vocal quality since beginning ST.   Remaining deficits: Voice disorder, vocal fold paralysis    Education / Equipment: EMST, Vocal Function Exercises, SOVTE, diaphragmatic breathing exercises   Patient agrees to discharge. Patient goals  were partially met. Patient is being discharged due to maximized rehab potential. .     PLAN:  SLP FREQUENCY: 1x/week  SLP DURATION: 8 weeks  PLANNED INTERVENTIONS: Environmental controls, Cueing hierachy, Functional tasks, SLP instruction and feedback, Compensatory strategies, Patient/family education, and [id number] Treatment of speech (30 or 45 min)   [employer], Student-SLP 04/22/2023, 3:35 PM   Check all possible CPT codes: [phone] - SLP treatment    Check all conditions that are expected to impact treatment: None of these apply   If treatment provided at initial evaluation, no treatment charged due to lack of authorization.

## 2023-05-12 ENCOUNTER — Other Ambulatory Visit: Payer: Self-pay

## 2023-05-12 ENCOUNTER — Ambulatory Visit (INDEPENDENT_AMBULATORY_CARE_PROVIDER_SITE_OTHER): Payer: Self-pay | Admitting: Nurse Practitioner

## 2023-05-12 ENCOUNTER — Encounter: Payer: Self-pay | Admitting: Nurse Practitioner

## 2023-05-12 VITALS — BP 121/67 | HR 62 | Wt 194.2 lb

## 2023-05-12 DIAGNOSIS — I10 Essential (primary) hypertension: Secondary | ICD-10-CM

## 2023-05-12 DIAGNOSIS — L97509 Non-pressure chronic ulcer of other part of unspecified foot with unspecified severity: Secondary | ICD-10-CM

## 2023-05-12 DIAGNOSIS — E11621 Type 2 diabetes mellitus with foot ulcer: Secondary | ICD-10-CM

## 2023-05-12 LAB — POCT GLYCOSYLATED HEMOGLOBIN (HGB A1C): Hemoglobin A1C: 8.4 % — AB (ref 4.0–5.6)

## 2023-05-12 MED ORDER — LISINOPRIL 40 MG PO TABS
40.0000 mg | ORAL_TABLET | Freq: Every day | ORAL | 5 refills | Status: DC
  Filled 2023-05-12 – 2023-05-16 (×2): qty 30, 30d supply, fill #0
  Filled 2023-06-16: qty 30, 30d supply, fill #1
  Filled 2023-07-21: qty 30, 30d supply, fill #2
  Filled 2023-08-22: qty 30, 30d supply, fill #3
  Filled 2023-09-17: qty 30, 30d supply, fill #4
  Filled 2023-11-24 (×2): qty 30, 30d supply, fill #5

## 2023-05-12 MED ORDER — AMLODIPINE BESYLATE 10 MG PO TABS
10.0000 mg | ORAL_TABLET | Freq: Every day | ORAL | 5 refills | Status: AC
  Filled 2023-05-12 – 2023-06-16 (×2): qty 30, 30d supply, fill #0
  Filled 2023-07-21: qty 30, 30d supply, fill #1
  Filled 2023-08-22: qty 30, 30d supply, fill #2
  Filled 2023-09-17: qty 30, 30d supply, fill #3
  Filled 2023-11-24 (×2): qty 30, 30d supply, fill #4
  Filled 2024-02-11: qty 30, 30d supply, fill #5

## 2023-05-12 MED ORDER — GLIPIZIDE ER 5 MG PO TB24
5.0000 mg | ORAL_TABLET | Freq: Every day | ORAL | 2 refills | Status: DC
  Filled 2023-05-12: qty 30, 30d supply, fill #0
  Filled 2023-06-16: qty 30, 30d supply, fill #1
  Filled 2023-07-21: qty 30, 30d supply, fill #2

## 2023-05-12 NOTE — Progress Notes (Signed)
 Subjective   Patient ID: Andrew Bruce, male    DOB: [birth year], 58 y.o.   MRN: [id number]  Chief Complaint  Patient presents with   Diabetes    Follow up    Referring provider: Jerrlyn Morel, NP  Andrew Bruce is a 58 y.o. male with Past Medical History: 02/06/2019: COVID-19 No date: Diabetes mellitus without complication (HCC) No date: Hypertension No date: PE (pulmonary thromboembolism) (HCC)     Comment:  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 in setting of COVID               ARDS No date: Pneumonia     Comment:  with Covid No date: Stroke Andrew Bruce)     Comment:  weakness on right side, occurred while he had Covid   HPI   Diabetes:   Patient presents for diabetic follow up. Currently taking metformin . Will add glipizide  xl daily. Complaint with diabetic diet. Taking medications. A1C today is 8.4. We have placed a referral for diabetic medication management with pharmacist.    Hypertension:   Taking medications as directed. Checking blood pressures at home - within normal range. No issues or concerns.   Is requesting referral to podiatry for bilateral foot pain   Denies f/c/s, n/v/d, hemoptysis, PND, leg swelling Denies chest pain or edema    No Known Allergies  Immunization History  Administered Date(s) Administered   Influenza, Seasonal, Injecte, Preservative Fre 09/27/2022   Influenza,inj,Quad PF,6+ Mos 03/25/2019, 11/17/2020   Pneumococcal Polysaccharide-23 03/25/2019, 11/17/2020    Tobacco History: Social History   Tobacco Use  Smoking Status Former  Smokeless Tobacco Never   Counseling given: Not Answered   Outpatient Encounter Medications as of 05/12/2023  Medication Sig   Accu-Chek Softclix Lancets lancets Use as instructed to check blood sugar daily. Dx E11.65   acetaminophen  (TYLENOL ) 500 MG tablet Take by mouth.   aspirin  EC 81 MG tablet Take 1 tablet by mouth daily.   atorvastatin  (LIPITOR) 40 MG tablet Take 1 tablet (40 mg total) by  mouth daily.   citalopram  (CELEXA ) 20 MG tablet Take 1 tablet (20 mg total) by mouth daily.   citalopram  (CELEXA ) 20 MG tablet Take 1 tablet (20 mg total) by mouth daily. For mood   glipiZIDE  (GLIPIZIDE  XL) 5 MG 24 hr tablet Take 1 tablet (5 mg total) by mouth daily with breakfast.   ibuprofen (ADVIL) 800 MG tablet Take by mouth.   metFORMIN  (GLUCOPHAGE -XR) 500 MG 24 hr tablet Take 2 tablets (1,000 mg total) by mouth 2 (two) times daily with a meal.   metoprolol  tartrate (LOPRESSOR ) 25 MG tablet Take 1 tablet (25 mg total) by mouth 2 (two) times daily. For blood pressure   pregabalin  (LYRICA ) 150 MG capsule Take 1 capsule (150 mg total) by mouth in the morning, at noon, and at bedtime.   traMADol  (ULTRAM ) 50 MG tablet Take 2 tablets (100 mg total) by mouth in the morning, at noon, and at bedtime. For chronic pain   [DISCONTINUED] amLODipine  (NORVASC ) 10 MG tablet Take 1 tablet (10 mg total) by mouth daily.   [DISCONTINUED] lisinopril  (ZESTRIL ) 40 MG tablet Take 1 tablet (40 mg total) by mouth daily.   amLODipine  (NORVASC ) 10 MG tablet Take 1 tablet (10 mg total) by mouth daily.   lisinopril  (ZESTRIL ) 40 MG tablet Take 1 tablet (40 mg total) by mouth daily.   [DISCONTINUED] gabapentin  (NEURONTIN ) 100 MG capsule Take 1 capsule (100 mg total) by mouth at bedtime.   [DISCONTINUED]  glipiZIDE  (GLUCOTROL ) 5 MG tablet Take 1 tablet (5 mg total) by mouth daily before breakfast. (Patient not taking: Reported on 05/12/2023)   No facility-administered encounter medications on file as of 05/12/2023.    Review of Systems  Review of Systems  Constitutional: Negative.   HENT: Negative.    Cardiovascular: Negative.   Gastrointestinal: Negative.   Allergic/Immunologic: Negative.   Neurological: Negative.   Psychiatric/Behavioral: Negative.       Objective:   BP 121/67   Pulse 62   Wt 194 lb 3.2 oz (88.1 kg)   SpO2 98%   BMI 30.42 kg/m   Wt Readings from Last 5 Encounters:  05/12/23 194 lb 3.2 oz  (88.1 kg)  12/27/22 189 lb (85.7 kg)  11/11/22 194 lb (88 kg)  10/11/22 193 lb (87.5 kg)  09/27/22 192 lb (87.1 kg)     Physical Exam Vitals and nursing note reviewed.  Constitutional:      General: He is not in acute distress.    Appearance: He is well-developed.  Cardiovascular:     Rate and Rhythm: Normal rate and regular rhythm.  Pulmonary:     Effort: Pulmonary effort is normal.     Breath sounds: Normal breath sounds.  Skin:    General: Skin is warm and dry.  Neurological:     Mental Status: He is alert and oriented to person, place, and time.       Assessment & Plan:   Type 2 diabetes, controlled, with ulcer of toe (HCC) -     POCT glycosylated hemoglobin (Hb A1C) -     glipiZIDE  ER; Take 1 tablet (5 mg total) by mouth daily with breakfast.  Dispense: 30 tablet; Refill: 2 -     AMB Referral [facility] Care Management -     CBC -     Comprehensive metabolic panel with GFR  Hypertension, unspecified type -     Lisinopril ; Take 1 tablet (40 mg total) by mouth daily.  Dispense: 30 tablet; Refill: 5 -     amLODIPine  Besylate; Take 1 tablet (10 mg total) by mouth daily.  Dispense: 30 tablet; Refill: 5 -     AMB Referral [facility] Care Management -     CBC -     Comprehensive metabolic panel with GFR     Return in about 3 months (around 08/11/2023).   Andrew Morel, NP 05/12/2023

## 2023-05-13 LAB — COMPREHENSIVE METABOLIC PANEL WITH GFR
ALT: 26 IU/L (ref 0–44)
AST: 18 IU/L (ref 0–40)
Albumin: 4.3 g/dL (ref 3.8–4.9)
Alkaline Phosphatase: 78 IU/L (ref 44–121)
BUN/Creatinine Ratio: 15 (ref 9–20)
BUN: 14 mg/dL (ref 6–24)
Bilirubin Total: 0.8 mg/dL (ref 0.0–1.2)
CO2: 21 mmol/L (ref 20–29)
Calcium: 9 mg/dL (ref 8.7–10.2)
Chloride: 101 mmol/L (ref 96–106)
Creatinine, Ser: 0.92 mg/dL (ref 0.76–1.27)
Globulin, Total: 2.5 g/dL (ref 1.5–4.5)
Glucose: 167 mg/dL — ABNORMAL HIGH (ref 70–99)
Potassium: 4.3 mmol/L (ref 3.5–5.2)
Sodium: 137 mmol/L (ref 134–144)
Total Protein: 6.8 g/dL (ref 6.0–8.5)
eGFR: 98 mL/min/{1.73_m2} (ref >59)

## 2023-05-13 LAB — CBC
Hematocrit: 39.3 % (ref 37.5–51.0)
Hemoglobin: 13.3 g/dL (ref 13.0–17.7)
MCH: 29.4 pg (ref 26.6–33.0)
MCHC: 33.8 g/dL (ref 31.5–35.7)
MCV: 87 fL (ref 79–97)
Platelets: 274 10*3/uL (ref 150–450)
RBC: 4.52 x10E6/uL (ref 4.14–5.80)
RDW: 11.9 % (ref 11.6–15.4)
WBC: 5.6 10*3/uL (ref 3.4–10.8)

## 2023-05-16 ENCOUNTER — Encounter
Payer: Medicare Other | Attending: Physical Medicine and Rehabilitation | Admitting: Physical Medicine and Rehabilitation

## 2023-05-16 ENCOUNTER — Encounter: Payer: Self-pay | Admitting: Physical Medicine and Rehabilitation

## 2023-05-16 ENCOUNTER — Other Ambulatory Visit: Payer: Self-pay

## 2023-05-16 VITALS — BP 135/77 | HR 71 | Ht 67.0 in | Wt 193.0 lb

## 2023-05-16 DIAGNOSIS — Z5181 Encounter for therapeutic drug level monitoring: Secondary | ICD-10-CM

## 2023-05-16 DIAGNOSIS — G8111 Spastic hemiplegia affecting right dominant side: Secondary | ICD-10-CM | POA: Diagnosis not present

## 2023-05-16 DIAGNOSIS — M792 Neuralgia and neuritis, unspecified: Secondary | ICD-10-CM

## 2023-05-16 DIAGNOSIS — G6281 Critical illness polyneuropathy: Secondary | ICD-10-CM

## 2023-05-16 DIAGNOSIS — I63119 Cerebral infarction due to embolism of unspecified vertebral artery: Secondary | ICD-10-CM | POA: Diagnosis not present

## 2023-05-16 DIAGNOSIS — S98119A Complete traumatic amputation of unspecified great toe, initial encounter: Secondary | ICD-10-CM

## 2023-05-16 DIAGNOSIS — Z79891 Long term (current) use of opiate analgesic: Secondary | ICD-10-CM | POA: Diagnosis not present

## 2023-05-16 DIAGNOSIS — G894 Chronic pain syndrome: Secondary | ICD-10-CM | POA: Diagnosis not present

## 2023-05-16 MED ORDER — TRAMADOL HCL 50 MG PO TABS
100.0000 mg | ORAL_TABLET | Freq: Three times a day (TID) | ORAL | 5 refills | Status: DC
  Filled 2023-05-16: qty 180, 30d supply, fill #0
  Filled 2023-07-21: qty 180, 30d supply, fill #1

## 2023-05-16 MED ORDER — CITALOPRAM HYDROBROMIDE 20 MG PO TABS
20.0000 mg | ORAL_TABLET | Freq: Every day | ORAL | 1 refills | Status: DC
  Filled 2023-05-16: qty 90, 90d supply, fill #0
  Filled 2023-08-22: qty 90, 90d supply, fill #1

## 2023-05-16 NOTE — Progress Notes (Signed)
 Subjective:    Patient ID: Andrew Bruce, male    DOB: 03-27-1965, 58 y.o.   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 Original admission 01/26/19- and came to CIR 03/23/19- discharged- 04/20/19.  Has visual difficulties- double vision due to stroke.  Here for f/u on stroke as well as nerve pain and DM.    A1c 8.4 this month- which is better, but still really elevated.    Speaking via interpretor.    Last UDS 1 year ago- on Tramadol .   Still has pain in feet and heels- mainly from knees down.  8/10-  pretty much all the time.   Taking tramadol - only takes 3x/week-  has 4 refills remaining-  thinks wasn't supposed to take unless hurting really bad.   Just got Lyrica  150 mg TID- just filled- only taking 1 pill/day- because of cost.    Pain Inventory Average Pain 8 Pain Right Now 8 My pain is constant and burning  In the last 24 hours, has pain interfered with the following? General activity 8 Relation with others 7 Enjoyment of life 9 What TIME of day is your pain at its worst? morning  Sleep (in general) Fair  Pain is worse with: walking Pain improves with: therapy/exercise and medication Relief from Meds: 6  Family History  Problem Relation Age of Onset   Healthy Sister    Healthy Brother    Social History   Socioeconomic History   Marital status: Married    Spouse name: Not on file   Number of children: 8   Years of education: Not on file   Highest education level: 6th grade  Occupational History   Not on file  Tobacco Use   Smoking status: Former   Smokeless  tobacco: Never  Vaping Use   Vaping status: Never Used  Substance and Sexual Activity   Alcohol use: Not Currently    Comment: rare to occasional   Drug use: Never   Sexual activity: Yes    Birth control/protection: None  Other Topics Concern   Not on file  Social History Narrative   Not on file   Social Drivers of Health   Financial Resource Strain: High Risk (09/21/2021)   [facility], [facility]   Overall Financial Resource Strain (CARDIA)    Difficulty of Paying Living Expenses: Very hard  Food Insecurity: Food Insecurity Present (05/12/2023)   Hunger Vital Sign    Worried About Running Out of Food in the Last Year: Often true    Ran Out of Food in the Last Year: Often true  Transportation Needs: No Transportation Needs (05/12/2023)   PRAPARE - Administrator, Civil Service (Medical): No    Lack of Transportation (Non-Medical): No  Physical Activity: Not on file  Stress: Not on file  Social Connections: Not on file   Past Surgical History:  Procedure Laterality Date   AMPUTATION Right 08/13/2019   Procedure: AMPUTATION RIGHT FIRST AND SECOND TOES;  Surgeon: Mayo Speck, MD;  Location: [facility] OR;  Service:  Vascular;  Laterality: Right;   DIRECT LARYNGOSCOPY Bilateral 04/06/2019   Procedure: MICRO DIRECT LARYNGOSCOPY WITH PROLARYN INJECTION;  Surgeon: Virgina Grills, MD;  Location: [facility] OR;  Service: ENT;  Laterality: Bilateral;   IR GASTROSTOMY TUBE MOD SED  03/10/2019   IR GASTROSTOMY TUBE REMOVAL  04/20/2019   WOUND DEBRIDEMENT Left 08/13/2019   Procedure: AMPUTATION  OF LEFT FIRST TOE, AND DEBRIDEMENT OF LEFT SECOND, THIRD AND FOURTH TOES;  Surgeon: Mayo Speck, MD;  Location: [facility] OR;  Service: Vascular;  Laterality: Left;   Past Surgical History:  Procedure Laterality Date   AMPUTATION Right 08/13/2019   Procedure: AMPUTATION RIGHT FIRST AND SECOND TOES;  Surgeon: Mayo Speck, MD;  Location: [facility] OR;  Service: Vascular;  Laterality: Right;    DIRECT LARYNGOSCOPY Bilateral 04/06/2019   Procedure: MICRO DIRECT LARYNGOSCOPY WITH PROLARYN INJECTION;  Surgeon: Virgina Grills, MD;  Location: [facility] OR;  Service: ENT;  Laterality: Bilateral;   IR GASTROSTOMY TUBE MOD SED  03/10/2019   IR GASTROSTOMY TUBE REMOVAL  04/20/2019   WOUND DEBRIDEMENT Left 08/13/2019   Procedure: AMPUTATION  OF LEFT FIRST TOE, AND DEBRIDEMENT OF LEFT SECOND, THIRD AND FOURTH TOES;  Surgeon: Mayo Speck, MD;  Location: [facility] OR;  Service: Vascular;  Laterality: Left;   Past Medical History:  Diagnosis Date   COVID-19 02/06/2019   Diabetes mellitus without complication (HCC)    Hypertension    PE (pulmonary thromboembolism) (HCC)    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   Pneumonia    with Covid   Stroke (HCC)    weakness on right side, occurred while he had Covid   BP 135/77   Pulse 71   Ht 5\' 7"  (1.702 m)   Wt 193 lb (87.5 kg)   SpO2 96%   BMI 30.23 kg/m   Opioid Risk Score:   Fall Risk Score:  `1  Depression screen PHQ 2/9     05/16/2023    1:22 PM 05/12/2023    1:54 PM 12/27/2022    3:18 PM 06/27/2022    2:51 PM 03/27/2022    3:25 PM 11/30/2021    2:34 PM 09/26/2021    3:44 PM  Depression screen PHQ 2/9  Decreased Interest 0 3 1 3 3 3  0  Down, Depressed, Hopeless 0 3 0 3 3 3  0  PHQ - 2 Score 0 6 1 6 6 6  0  Altered sleeping  3 3 3 3  3   Tired, decreased energy  3 3 3 3  3   Change in appetite  3 3 3 3   0  Feeling bad or failure about yourself   3 2 2 3   0  Trouble concentrating  3 2 3 3  3   Moving slowly or fidgety/restless  3 3 3 3   0  Suicidal thoughts  0 0 0 1  0  PHQ-9 Score  24 17 23 25  9   Difficult doing work/chores  Extremely dIfficult Somewhat difficult Very difficult Very difficult      Review of Systems  Musculoskeletal:  Positive for gait problem.       Pain in both legs down to both feet  All other systems reviewed and are negative.      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 Original admission 01/26/19- and came to CIR 03/23/19- discharged- 04/20/19.  Has visual difficulties- double vision due to stroke.  Here for f/u on stroke as well as nerve pain and DM.     A1c is 8.4- needs to be LESS than 7!- eat less carbohydrates- all bread and potatoes and rice, - so focus on protein- beans, meats, dairy/milk products; and veggies!!!! FISH/shrimp!!   2. Getting Blood sugars under controlled- This will help pain get better-  if blood sugars under control- but it has to stay that way.    3. Will have seen by Dr Estill Hemming for Jenelle Mis-  to help get pain under better control of feet/legs- from nerve pain.    4.   Urine drug screen per clinic policy- has been 1year since last checked.    5. Only taking tramadol  3x/week- needs to take 100 mg /2 tabs 3x/day-  if can afford it! $16.51.   6. Only taking Lyrica   30 pills/month- but is written for 90 pills/month.id only getting 30 pills/month from pharmacy. Had to pay $17.92.   7. Called [facility] about  his costs of meds as well as getting  meds as prescribed- including Tramadol  and Lyrica -  have sent Tramadol  in-  and has refills of Lyrica - already last filled 04/16/23.    8. Has gotten glipizide  not metformin  in awhile per computer   Just got Lisinopril  40 mg daily, but doesn't show Metoprolol  in   Needs to get PCP refill your Metoprolol  - doesn't  have refills per insurance.   9. Gotta take his Diabetes, high blood pressure and cholesterol meds every day along with aspirin   or could have another stroke.   10. We went over all the meds- including meds for BP, DM and cholesterol- as well as the 3 meds from me- including Citalopram , Pregabalin  and Tramadol  for pain and mood.    11.  Will try to get pt Qutenza for Leg/foot pain, but I'm concerned since he doesn't have Medicare part D or Medicaid-  in computer.    12. Need to sign up for drug coverage with insurance when time to.   13. The more you take the pain meds up to what is prescribed, the better for pain control.   14. F/U in 3 months double appt- CVA/Long covid/ICU neuropathy   I spent a total of 36   minutes on total care today- >50% coordination of care- due to  called [facility] about prices of meds- and d/w pt about qutenza and getting better bg control- and how ot do so- also drug scoverage when can sign up again.

## 2023-05-16 NOTE — Patient Instructions (Signed)
 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 Original admission 01/26/19- and came to CIR 03/23/19- discharged- 04/20/19.  Has visual difficulties- double vision due to stroke.  Here for f/u on stroke as well as nerve pain and DM.     A1c is 8.4- needs to be LESS than 7!- eat less carbohydrates- all bread and potatoes and rice, - so focus on protein- beans, meats, dairy/milk products; and veggies!!!! FISH/shrimp!!   2. Getting Blood sugars under controlled- This will help pain get better-  if blood sugars under control- but it has to stay that way.    3. Will have seen by Dr Estill Hemming for Jenelle Mis-  to help get pain under better control of feet/legs- from nerve pain.    4.   Urine drug screen per clinic policy- has been 1year since last checked.    5. Only taking tramadol  3x/week- needs to take 100 mg /2 tabs 3x/day-  if can afford it! $16.51.   6. Only taking Lyrica   30 pills/month- but is written for 90 pills/month.id only getting 30 pills/month from pharmacy. Had to pay $17.92.   7. Called [facility] about  his costs of meds as well as getting  meds as prescribed- including Tramadol  and Lyrica -  have sent Tramadol  in-  and has refills of Lyrica - already last filled 04/16/23.    8. Has gotten glipizide  not metformin  in awhile per computer   Just got Lisinopril  40 mg daily, but doesn't show Metoprolol  in   Needs to get PCP refill your Metoprolol  - doesn't have refills per insurance.   9. Gotta take his Diabetes, high blood pressure and cholesterol meds every day along with aspirin   or could have another stroke.   10.  We went over all the meds- including meds for BP, DM and cholesterol- as well as the 3 meds from me- including Citalopram , Pregabalin  and Tramadol  for pain and mood.    11.  Will try to get pt Qutenza for Leg/foot pain, but I'm concerned since he doesn't have Medicare part D or Medicaid-  in computer.    12. Need to sign up for drug coverage with insurance when time to.   13. The more you take the pain meds up to what is prescribed, the better for pain control.   14. F/U in 3 months double appt- CVA/Long covid/ICU neuropathy

## 2023-05-16 NOTE — Progress Notes (Deleted)
 Subjective:    Patient ID: Andrew Bruce, male    DOB: 01-31-1965, 58 y.o.   MRN: [id number]  HPI   Pain Inventory Average Pain {NUMBERS; 0-10:5044} Pain Right Now {NUMBERS; 0-10:5044} My pain is {PAIN DESCRIPTION:21022940}  In the last 24 hours, has pain interfered with the following? General activity {NUMBERS; 0-10:5044} Relation with others {NUMBERS; 0-10:5044} Enjoyment of life {NUMBERS; 0-10:5044} What TIME of day is your pain at its worst? {time of day:24191} Sleep (in general) {BHH GOOD/FAIR/POOR:22877}  Pain is worse with: {ACTIVITIES:21022942} Pain improves with: {PAIN IMPROVES UYQI:34742595} Relief from Meds: {NUMBERS; 0-10:5044}  Family History  Problem Relation Age of Onset   Healthy Sister    Healthy Brother    Social History   Socioeconomic History   Marital status: Married    Spouse name: Not on file   Number of children: 8   Years of education: Not on file   Highest education level: 6th grade  Occupational History   Not on file  Tobacco Use   Smoking status: Former   Smokeless tobacco: Never  Vaping Use   Vaping status: Never Used  Substance and Sexual Activity   Alcohol use: Not Currently    Comment: rare to occasional   Drug use: Never   Sexual activity: Yes    Birth control/protection: None  Other Topics Concern   Not on file  Social History Narrative   Not on file   Social Drivers of Health   Financial Resource Strain: High Risk (09/21/2021)   [facility], [facility]   Overall Financial Resource Strain (CARDIA)    Difficulty of Paying Living Expenses: Very hard  Food Insecurity: Food Insecurity Present (05/12/2023)   Hunger Vital Sign    Worried About Running Out of Food in the Last Year: Often true    Ran Out of Food in the Last Year: Often true  Transportation Needs: No Transportation Needs (05/12/2023)   PRAPARE - Administrator, Civil Service (Medical): No    Lack of Transportation  (Non-Medical): No  Physical Activity: Not on file  Stress: Not on file  Social Connections: Not on file   Past Surgical History:  Procedure Laterality Date   AMPUTATION Right 08/13/2019   Procedure: AMPUTATION RIGHT FIRST AND SECOND TOES;  Surgeon: Mayo Speck, MD;  Location: [facility] OR;  Service: Vascular;  Laterality: Right;   DIRECT LARYNGOSCOPY Bilateral 04/06/2019   Procedure: MICRO DIRECT LARYNGOSCOPY WITH PROLARYN INJECTION;  Surgeon: Virgina Grills, MD;  Location: [facility] OR;  Service: ENT;  Laterality: Bilateral;   IR GASTROSTOMY TUBE MOD SED  03/10/2019   IR GASTROSTOMY TUBE REMOVAL  04/20/2019   WOUND DEBRIDEMENT Left 08/13/2019   Procedure: AMPUTATION  OF LEFT FIRST TOE, AND DEBRIDEMENT OF LEFT SECOND, THIRD AND FOURTH TOES;  Surgeon: Mayo Speck, MD;  Location: [facility] OR;  Service: Vascular;  Laterality: Left;   Past Surgical History:  Procedure Laterality Date   AMPUTATION Right 08/13/2019   Procedure: AMPUTATION RIGHT FIRST AND SECOND TOES;  Surgeon: Mayo Speck, MD;  Location: [facility] OR;  Service: Vascular;  Laterality: Right;   DIRECT LARYNGOSCOPY Bilateral 04/06/2019   Procedure: MICRO DIRECT LARYNGOSCOPY WITH PROLARYN INJECTION;  Surgeon: Virgina Grills, MD;  Location: [facility] OR;  Service: ENT;  Laterality: Bilateral;   IR GASTROSTOMY TUBE MOD SED  03/10/2019   IR GASTROSTOMY TUBE REMOVAL  04/20/2019   WOUND DEBRIDEMENT Left 08/13/2019   Procedure: AMPUTATION  OF LEFT FIRST TOE, AND  DEBRIDEMENT OF LEFT SECOND, THIRD AND FOURTH TOES;  Surgeon: Mayo Speck, MD;  Location: [facility] OR;  Service: Vascular;  Laterality: Left;   Past Medical History:  Diagnosis Date   COVID-19 02/06/2019   Diabetes mellitus without complication (HCC)    Hypertension    PE (pulmonary thromboembolism) (HCC)    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   Pneumonia    with Covid   Stroke (HCC)    weakness on right side, occurred while he had Covid   BP 135/77   Pulse 71   Ht 5\' 7"  (1.702 m)   Wt 193  lb (87.5 kg)   SpO2 96%   BMI 30.23 kg/m   Opioid Risk Score:   Fall Risk Score:  `1  Depression screen [facility] 2/9     05/12/2023    1:54 PM 12/27/2022    3:18 PM 06/27/2022    2:51 PM 03/27/2022    3:25 PM 11/30/2021    2:34 PM 09/26/2021    3:44 PM 08/13/2021    3:01 PM  Depression screen PHQ 2/9  Decreased Interest 3 1 3 3 3  0 0  Down, Depressed, Hopeless 3 0 3 3 3  0 0  PHQ - 2 Score 6 1 6 6 6  0 0  Altered sleeping 3 3 3 3  3    Tired, decreased energy 3 3 3 3  3    Change in appetite 3 3 3 3   0   Feeling bad or failure about yourself  3 2 2 3   0   Trouble concentrating 3 2 3 3  3    Moving slowly or fidgety/restless 3 3 3 3   0   Suicidal thoughts 0 0 0 1  0   PHQ-9 Score 24 17 23 25  9    Difficult doing work/chores Extremely dIfficult Somewhat difficult Very difficult Very difficult       Review of Systems     Objective:   Physical Exam        Assessment & Plan:

## 2023-05-21 LAB — TOXASSURE SELECT,+ANTIDEPR,UR

## 2023-05-30 ENCOUNTER — Telehealth: Payer: Self-pay | Admitting: *Deleted

## 2023-05-30 NOTE — Progress Notes (Signed)
 Care Guide Pharmacy Note  05/30/2023 Name: Andrew Bruce MRN: [id number] DOB: [birth year]  Referred By: Jerrlyn Morel, NP Reason for referral: Complex Care Management (Initial outreach to schedule referral with PharmD [facility] )   Andrew Bruce is a 58 y.o. year old male who is a primary care patient of Jerrlyn Morel, NP.  [place] was referred to the pharmacist for assistance related to: HTN, DMII, and ploypharmacy  An unsuccessful telephone outreach was attempted today to contact the patient who was referred to the pharmacy team for assistance with medication management. Additional attempts will be made to contact the patient.  [name]  [facility]  [facility], [facility] Guide  Direct Dial: [phone]  Fax [phone]

## 2023-06-02 NOTE — Progress Notes (Signed)
 Complex Care Management Note Care Guide Note  06/02/2023 Name: Andrew Bruce MRN: [id number] DOB: May 28, 1965   Complex Care Management Outreach Attempts: A second unsuccessful outreach was attempted today to offer the patient with information about available complex care management services. [facility] WU#[id number] named Leighton Punches   Follow Up Plan:  Additional outreach attempts will be made to offer the patient complex care management information and services.   Encounter Outcome:  No Answer  [name]  [facility]  [facility], [facility] Guide  Direct Dial: [phone]  Fax [phone]

## 2023-06-03 NOTE — Progress Notes (Signed)
 Care Guide Pharmacy Note  06/03/2023 Name: Andrew Bruce MRN: [id number] DOB: 1965-12-27  Referred By: Jerrlyn Morel, NP Reason for referral: Complex Care Management (Initial outreach to schedule referral with PharmD [facility] )   Andrew Bruce is a 58 y.o. year old male who is a primary care patient of Jerrlyn Morel, NP.  Andrew Bruce was referred to the pharmacist for assistance related to: DMII  Successful contact was made with the patient to discuss pharmacy services including being ready for the pharmacist to call at least 5 minutes before the scheduled appointment time and to have medication bottles and any blood pressure readings ready for review. The patient agreed to meet with the pharmacist via telephone visit on (date/time). 06/16/23 at 1:00 PM  [name]  [facility], [facility] Guide  Direct Dial: [phone]  Fax [phone]

## 2023-06-16 ENCOUNTER — Other Ambulatory Visit: Payer: Self-pay

## 2023-06-16 ENCOUNTER — Other Ambulatory Visit (HOSPITAL_COMMUNITY): Payer: Self-pay

## 2023-06-16 DIAGNOSIS — E11621 Type 2 diabetes mellitus with foot ulcer: Secondary | ICD-10-CM

## 2023-06-16 DIAGNOSIS — I63119 Cerebral infarction due to embolism of unspecified vertebral artery: Secondary | ICD-10-CM

## 2023-06-16 DIAGNOSIS — E1165 Type 2 diabetes mellitus with hyperglycemia: Secondary | ICD-10-CM

## 2023-06-16 MED ORDER — METOPROLOL TARTRATE 25 MG PO TABS
25.0000 mg | ORAL_TABLET | Freq: Two times a day (BID) | ORAL | 3 refills | Status: AC
  Filled 2023-06-16: qty 180, 90d supply, fill #0
  Filled 2023-11-24 – 2024-02-11 (×3): qty 180, 90d supply, fill #1

## 2023-06-16 MED ORDER — METFORMIN HCL ER 500 MG PO TB24
1000.0000 mg | ORAL_TABLET | Freq: Two times a day (BID) | ORAL | 11 refills | Status: AC
  Filled 2023-07-21: qty 360, 90d supply, fill #0
  Filled 2023-11-24 (×2): qty 360, 90d supply, fill #1

## 2023-06-16 MED ORDER — ASPIRIN 81 MG PO TBEC
81.0000 mg | DELAYED_RELEASE_TABLET | Freq: Every day | ORAL | 3 refills | Status: AC
  Filled 2023-06-16: qty 30, 30d supply, fill #0
  Filled 2023-07-21: qty 30, 30d supply, fill #1
  Filled 2023-08-22: qty 30, 30d supply, fill #2
  Filled 2023-09-17: qty 30, 30d supply, fill #3
  Filled 2023-11-24 (×2): qty 30, 30d supply, fill #4
  Filled 2024-02-11: qty 30, 30d supply, fill #5

## 2023-06-16 NOTE — Progress Notes (Signed)
 06/16/2023 Name: Andrew Bruce MRN: [id number] DOB: 18-Aug-1965  Chief Complaint  Patient presents with   Diabetes   Hypertension   Hyperlipidemia    Andrew Bruce is a 58 y.o. year old male who presented for a telephone visit. Outreach patient using [employer], [place] #[id number].   They were referred to the pharmacist by their PCP for assistance in managing diabetes. PMH includes T2DM, embolic stroke, HTN, HLD.    Subjective: Patient reports that he is doing well today. He confirms that he restarted glipizide  as prescribed at his last PCP appt.   Care Team: Primary Care Provider: Jerrlyn Morel, NP ; Next Scheduled Visit: 08/11/23 Neurology - scheduled with Johny Nap, NP 09/17/23  Medication Access/Adherence  Current Pharmacy:  Mary[facility] Smalling - [facility] [address] [place] [address] Phone: [phone] Fax: [phone]  [facility] [address] [place] [address] Phone: [phone] Fax: [phone]   Patient reports affordability concerns with their medications: No  - getting medications via [facility] Patient reports access/transportation concerns to their pharmacy: No  Patient reports adherence concerns with their medications:  No  - denies missed doses of his medications. Reports that his wife also helps him remember his medications.    Diabetes:   Current medications: glipizide  XL 5 mg daily, metformin  XR 1000 mg BID Medications tried in the past:   Current glucose readings: 100-210 mg/dL - reports that he is checking his BG daily ~10AM. Reports that he only sees numbers > 200 a couple times a month.  Using glucometer; unknown brand.   Reports that he did have an episode of feeling shaky 3-4 days ago - he checked his BG which was 128 mg/dL.   Patient reports hypoglycemic s/sx including shakiness, which last occurred 3-4 days ago, check his BG which was 128 mg/dL.  No dizziness, sweatiness. Patient denies hyperglycemic symptoms including polydipsia, polyphagia, nocturia, neuropathy, blurred vision. He does report polyuria which is stable for him.   Current meal patterns: Eating 3 meals a day.   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 Risk Factors: ASCVD, HLD, HTN, T2DM   Clinical ASCVD: Yes  The ASCVD Risk score (Arnett DK, et al., 2019) failed to calculate for the following reasons:   Risk score cannot be calculated because patient has a medical history suggesting prior/existing ASCVD    Objective:  BP Readings from Last 3 Encounters:  05/16/23 135/77  05/12/23 121/67  12/27/22 98/63    Lab Results  Component Value Date   HGBA1C 8.4 (A) 05/12/2023    Lab Results  Component Value Date   CREATININE 0.92 05/12/2023   BUN 14 05/12/2023   NA 137 05/12/2023   K 4.3 05/12/2023   CL 101 05/12/2023   CO2 21 05/12/2023    Lab Results  Component Value Date   CHOL 239 (H) 06/27/2022   HDL 48 06/27/2022   LDLCALC 142 (H) 06/27/2022   LDLDIRECT 73.0  02/18/2019   TRIG 272 (H) 06/27/2022   CHOLHDL 5.0 06/27/2022    Medications Reviewed Today     Reviewed by Adra Alanis, RPH (Pharmacist) on 06/16/23 at 1433  Med List Status: <None>   Medication Order Taking? Sig Documenting Provider Last Dose Status Informant  Accu-Chek Softclix Lancets lancets [id number]  Use as instructed to check blood sugar daily.  Dx E11.65 Jerrlyn Morel, NP  Active   acetaminophen  (TYLENOL ) 500 MG tablet [id number]  Take by mouth. [provider]  Active            Med Note Julian Obey   Wed Mar 27, 2022  3:20 PM) Prn    amLODipine  (NORVASC ) 10 MG tablet [id number] Yes Take 1 tablet (10 mg total) by mouth daily. Jerrlyn Morel, NP Taking Active   aspirin  EC 81 MG tablet [id number]  Take 1 tablet by mouth daily. [provider]  Active   atorvastatin  (LIPITOR) 40 MG tablet [id number] Yes Take 1 tablet (40 mg total) by mouth daily. Jerrlyn Morel, NP Taking Active   citalopram  (CELEXA ) 20 MG tablet [id number] Yes Take 1 tablet (20 mg total) by mouth daily. For mood [name], [name], MD Taking Active     Discontinued 01/17/23 0914 (Change in therapy)            Med Note (POWERS, RAVEN M   Thu Jan 09, 2023  9:18 AM) Switch for Pregabalin  on 02/07/22 next month  glipiZIDE  (GLIPIZIDE  XL) 5 MG 24 hr tablet [id number] Yes Take 1 tablet (5 mg total) by mouth daily with breakfast. Jerrlyn Morel, NP Taking Active            Med Note Adra Alanis   Mon Jun 16, 2023  2:33 PM) Taking ~9AM  ibuprofen (ADVIL) 800 MG tablet [id number]  Take by mouth. [provider]  Active            Med Note Finley Hugh, CHERYL A   Tue Apr 09, 2022  2:43 PM) As needed  lisinopril  (ZESTRIL ) 40 MG tablet [id number] Yes Take 1 tablet (40 mg total) by mouth daily. Jerrlyn Morel, NP Taking Active   metFORMIN  (GLUCOPHAGE -XR) 500 MG 24 hr tablet [id number] Yes Take 2 tablets (1,000 mg total) by mouth 2 (two) times daily with a meal. Jerrlyn Morel, NP Taking Active   metoprolol  tartrate (LOPRESSOR ) 25 MG tablet [id number] Yes Take 1 tablet (25 mg total) by mouth 2 (two) times daily. For blood pressure Jerrlyn Morel, NP Taking Active   pregabalin  (LYRICA ) 150 MG capsule [id number] Yes Take 1 capsule (150 mg total) by mouth in the morning, at noon, and at bedtime. Jerrlyn Morel, NP Taking Active   traMADol  (ULTRAM ) 50 MG  tablet [id number] Yes Take 2 tablets (100 mg total) by mouth in the morning, at noon, and at bedtime. For chronic pain [name], [name], MD Taking Active               Assessment/Plan:   Diabetes: - Currently uncontrolled with last A1C of 8.4% above goal < 7%. Patient is a great candidate for a GLP-1RA with ASCVD history, T2DM, and BMI > 30. However, patient is not willing to start an injectable medication at this time. He feels his BG have improved since starting glipizide . He was not able to list specific readings from his glucometer. He did report one episode consistent with symptoms of hypoglycemia, but BG was not < 70. Hesitant to continue increasing  glipizide  given residual deficits after stroke which likely increase risk of falls. Fill history is appropriate.  - Reviewed long term cardiovascular and renal outcomes of uncontrolled blood sugar - Reviewed goal A1c, goal fasting, and goal 2 hour post prandial glucose - Recommend to continue glipizide  XL 5 mg daily, metformin  XR 1000 mg BID - Discussed risk/benefit of addition of Trulicity today, patient not willing to start injectable medication at this time. Will re-discuss at follow-up. No evidence of pancreatitis, MEN2 in chart, but need to confirm with patient.  - Recommend to check glucose once daily fasting and occasionally 2 hr PPG.  - Due for repeat A1C in July 2025. Due for yearly UACR at next PCP appt.   Hypertension: - Currently controlled - Recommend to continue amlodipine  10 mg daily, lisinopril  40 mg daily, metoprolol  tartrate 25 mg BID.  - Confirmed that patient has refills left on all his medications.    Hyperlipidemia/ASCVD Risk Reduction: - Currently uncontrolled with last LDL-C 142 mg/dL, however patient was not adherent to statin at time of lab. Due for repeat lipid panel - Reviewed long term complications of uncontrolled cholesterol - Recommend to continue atorvastatin  40 mg daily  - Due for repeat lipid panel at PCP  f/u  Follow Up Plan: Pharmacist 07/28/23, PCP 08/11/23  Arthea Larsson, PharmD PGY1 Pharmacy Resident

## 2023-06-17 ENCOUNTER — Other Ambulatory Visit: Payer: Self-pay

## 2023-06-24 DIAGNOSIS — R490 Dysphonia: Secondary | ICD-10-CM | POA: Diagnosis not present

## 2023-07-11 ENCOUNTER — Encounter: Attending: Physical Medicine and Rehabilitation | Admitting: Physical Medicine & Rehabilitation

## 2023-07-11 ENCOUNTER — Telehealth: Payer: Self-pay

## 2023-07-11 ENCOUNTER — Encounter: Payer: Self-pay | Admitting: Physical Medicine & Rehabilitation

## 2023-07-11 VITALS — BP 150/87 | HR 66 | Ht 67.0 in | Wt 196.2 lb

## 2023-07-11 DIAGNOSIS — G894 Chronic pain syndrome: Secondary | ICD-10-CM | POA: Diagnosis not present

## 2023-07-11 DIAGNOSIS — G6281 Critical illness polyneuropathy: Secondary | ICD-10-CM | POA: Diagnosis not present

## 2023-07-11 DIAGNOSIS — M792 Neuralgia and neuritis, unspecified: Secondary | ICD-10-CM | POA: Insufficient documentation

## 2023-07-11 MED ORDER — CAPSAICIN-CLEANSING GEL 8 % EX KIT
2.0000 | PACK | Freq: Once | CUTANEOUS | Status: AC
  Administered 2023-07-11: 2 via TOPICAL

## 2023-07-11 NOTE — Progress Notes (Signed)
 Subjective:    Patient ID: Andrew Bruce, male    DOB: 08-16-1965, 58 y.o.   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  Patient was referred by Dr. [name] for potential Qutenza treatment for diabetic polyneuropathy.  Speaking via interpretor.   Patient has a history of toe amputations in the past.  Patient reports he has not had any recent open wounds.  He continues to have worsening burning pain in his feet dorsal and plantar for several years.  He says it feels like fire.  Pain is worst on his dorsal feet.  He has been using Lyrica  150 mg but only once a day due to cost.  He also has pain around his knees, but this is a different type of pain, more aching.   Pain Inventory Average Pain 8 Pain Right Now 8 My pain is constant and burning  In the last 24 hours, has pain interfered with the following? General activity 8 Relation with others 7 Enjoyment of life 9 What TIME of day is your pain at its worst? morning  Sleep (in general) Fair  Pain is worse with: walking Pain improves with: therapy/exercise and medication Relief from Meds: 6  Family History  Problem Relation Age of Onset   Healthy Sister    Healthy Brother    Social History   Socioeconomic History   Marital status: Married    Spouse name: Not on file   Number of children: 8   Years of education: Not on file   Highest education level: 6th grade  Occupational History   Not on file  Tobacco Use   Smoking status: Former   Smokeless tobacco: Never  Vaping Use   Vaping status: Never Used  Substance and Sexual Activity   Alcohol use: Not Currently    Comment: rare to  occasional   Drug use: Never   Sexual activity: Yes    Birth control/protection: None  Other Topics Concern   Not on file  Social History Narrative   Not on file   Social Drivers of Health   Financial Resource Strain: High Risk (09/21/2021)   Received from [facility]   Overall Financial Resource Strain (CARDIA)    Difficulty of Paying Living Expenses: Very hard  Food Insecurity: Food Insecurity Present (05/12/2023)   Hunger Vital Sign    Worried About Running Out of Food in the Last Year: Often true    Ran Out of Food in the Last Year: Often true  Transportation Needs: No Transportation Needs (05/12/2023)   PRAPARE - Administrator, Civil Service (Medical): No    Lack of Transportation (Non-Medical): No  Physical Activity: Not on file  Stress: Not on file  Social Connections: Not on file   Past Surgical History:  Procedure Laterality Date   AMPUTATION Right 08/13/2019   Procedure: AMPUTATION RIGHT FIRST AND SECOND TOES;  Surgeon: Oris Krystal FALCON, MD;  Location: [facility] OR;  Service: Vascular;  Laterality: Right;   DIRECT LARYNGOSCOPY Bilateral 04/06/2019   Procedure: MICRO DIRECT LARYNGOSCOPY WITH PROLARYN INJECTION;  Surgeon: Carlie Clark, MD;  Location: [facility] OR;  Service:  ENT;  Laterality: Bilateral;   IR GASTROSTOMY TUBE MOD SED  03/10/2019   IR GASTROSTOMY TUBE REMOVAL  04/20/2019   WOUND DEBRIDEMENT Left 08/13/2019   Procedure: AMPUTATION  OF LEFT FIRST TOE, AND DEBRIDEMENT OF LEFT SECOND, THIRD AND FOURTH TOES;  Surgeon: Oris Krystal FALCON, MD;  Location: [facility] OR;  Service: Vascular;  Laterality: Left;   Past Surgical History:  Procedure Laterality Date   AMPUTATION Right 08/13/2019   Procedure: AMPUTATION RIGHT FIRST AND SECOND TOES;  Surgeon: Oris Krystal FALCON, MD;  Location: [facility] OR;  Service: Vascular;  Laterality: Right;   DIRECT LARYNGOSCOPY Bilateral 04/06/2019   Procedure: MICRO DIRECT LARYNGOSCOPY WITH PROLARYN INJECTION;  Surgeon: Carlie Clark, MD;  Location[facility] OR;   Service: ENT;  Laterality: Bilateral;   IR GASTROSTOMY TUBE MOD SED  03/10/2019   IR GASTROSTOMY TUBE REMOVAL  04/20/2019   WOUND DEBRIDEMENT Left 08/13/2019   Procedure: AMPUTATION  OF LEFT FIRST TOE, AND DEBRIDEMENT OF LEFT SECOND, THIRD AND FOURTH TOES;  Surgeon: Oris Krystal FALCON, MD;  Location: [facility] OR;  Service: Vascular;  Laterality: Left;   Past Medical History:  Diagnosis Date   COVID-19 02/06/2019   Diabetes mellitus without complication (HCC)    Hypertension    PE (pulmonary thromboembolism) (HCC)    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  Opioid Risk Score:   Fall Risk Score:  `1  Depression screen PHQ 2/9     05/16/2023    1:22 PM 05/12/2023    1:54 PM 12/27/2022    3:18 PM 06/27/2022    2:51 PM 03/27/2022    3:25 PM 11/30/2021    2:34 PM 09/26/2021    3:44 PM  Depression screen PHQ 2/9  Decreased Interest 0 3 1 3 3 3  0  Down, Depressed, Hopeless 0 3 0 3 3 3  0  PHQ - 2 Score 0 6 1 6 6 6  0  Altered sleeping  3 3 3 3  3   Tired, decreased energy  3 3 3 3  3   Change in appetite  3 3 3 3   0  Feeling bad or failure about yourself   3 2 2 3   0  Trouble concentrating  3 2 3 3  3   Moving slowly or fidgety/restless  3 3 3 3   0  Suicidal thoughts  0 0 0 1  0  PHQ-9 Score  24 17 23 25  9   Difficult doing work/chores  Extremely dIfficult Somewhat difficult Very difficult Very difficult      Review of Systems  Musculoskeletal:  Positive for gait problem.       Pain in both legs down to both feet  All other systems reviewed and are negative.      Objective:   Physical Exam  Gen: no distress, normal appearing Chest: normal effort, normal rate of breathing Abd: soft, non-distended Psych: pleasant, normal affect Skin: No breakdown noted on his feet Neuro: Alert and awake, follows commands, cranial nerves II through  XII grossly intact, normal speech and language Moving all 4 extremities to gravity and resistance  Altered sensation in his feet in stocking glove type distribution  No abnormal tone appreciated.   Musculoskeletal:  Patient has toe amputations on both feet.  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  Diabetic polyneuropathy -Discussed Qutenza as an option for neuropathic pain control. Discussed that this is a capsaicin patch, stronger than capsaicin cream. Discussed that it is currently approved for diabetic peripheral neuropathy and post-herpetic neuralgia, but that it has also shown benefit in treating other forms of neuropathy. Provided patient with link to site to learn more about the patch: https://www.clark.biz/. Discussed that the patch would be placed in office and benefits usually last 3 months. Discussed that unintended exposure to capsaicin can cause severe irritation of eyes, mucous membranes, respiratory tract, and skin, but that Qutenza is a local treatment and does not have the systemic side effects of other nerve medications. Discussed that there may be pain, itching, erythema, and decreased sensory function associated with the application of Qutenza. Side effects usually subside within 1 week. A cold pack of analgesic medications can help with these side effects. Blood pressure can also be increased due to pain associated with administration of the patch. - Patient reports he is agreeable to cost of Qutenza -2 patch of Qutenza was applied to the area of pain on bilateral feet.  Two thirds of a patch was applied to  each dorsal foot and one third of a patch was applied to each plantar foot. Blood pressure was monitored every 15 minutes. The patient tolerated the procedure well. Post-procedure instructions were given and follow-up has been scheduled.

## 2023-07-11 NOTE — Telephone Encounter (Signed)
 Copy of estimate for (2) two Qutenza patches given to the patient for review prior to proceeding with the procedure.  Patient reviewed and agrees with estimate received of [phone].

## 2023-07-21 ENCOUNTER — Other Ambulatory Visit: Payer: Self-pay

## 2023-07-23 ENCOUNTER — Other Ambulatory Visit: Payer: Self-pay

## 2023-07-28 ENCOUNTER — Other Ambulatory Visit: Payer: Self-pay

## 2023-07-28 NOTE — Progress Notes (Signed)
 07/28/2023 Name: Andrew Bruce MRN: [id number] DOB: [birth year]  Chief Complaint  Patient presents with   Diabetes   Hypertension   Hyperlipidemia    Andrew Bruce is a 58 y.o. year old male who presented for a telephone visit. Outreach patient using [employer], [place] #[id number].   They were referred to the pharmacist by their PCP for assistance in managing diabetes. PMH includes T2DM, embolic stroke, HTN, HLD.    Subjective:  Patient was last engaged by pharmacy via telephone on 06/16/23. At this appointment he reported taking metformin  and glipizide  as prescribed. He was not willing to start injectable Trulicity at this appointment (fear of injections).   Patient reports that he is doing well today. He feels that his blood sugars have been under control.  Care Team: Primary Care Provider: Oley Bascom RAMAN, NP ; Next Scheduled Visit: 08/11/23 Neurology - scheduled with Harlene Bogaert, NP 09/17/23  Medication Access/Adherence  Current Pharmacy:  [facility] - [facility] [address] [place] [address] Phone: [phone] Fax: [phone]  [facility] - [facility] [address] [place] [address] Phone: [phone] Fax: [phone]   Patient reports affordability concerns with their medications: No  - getting medications via [facility] Patient reports access/transportation concerns to their pharmacy: No  Patient reports adherence concerns with their medications:  No  - denies missed doses of his medications. Reports that his wife also helps him remember his medications.    Diabetes:   Current medications: glipizide  XL 5 mg daily, metformin  XR 1000 mg BID Medications tried in the past: n/a  Current glucose readings: 126 mg/dL yesterday afternoon. Denies BG > 200 mg/dL. Endorses BG < 70 mg/dL last week - described below. Using glucometer; unknown brand.   Reports that he did have hypoglycemic  s/sx including sweatiness and shakiness with BG < 70 mg/dL last week - this was in the afternoon. He reports that he had not eaten enough that day. He treated with 1/2 of orange juice.  Patient denies hyperglycemic symptoms including polydipsia, polyphagia, nocturia, blurred vision. He does report polyuria which is stable for him. He also has LE neuropathy (recently seen by Dr. Urbano for physical rehabilitation).  Current meal patterns: Eating 3 meals a day. He currently has 2 tortillas with each meal (6 total daily). He also has rice with many of his meals. Drinks: Water  throughout the day. Coffee in the morning. No soda or juice.   Current physical activity: Walks every afternoon.   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 Risk Factors: ASCVD, HLD, HTN, T2DM   Clinical ASCVD: Yes  The ASCVD Risk score (Arnett DK, et al., 2019) failed to calculate for the following reasons:   Risk score cannot be calculated because patient has a medical history suggesting prior/existing ASCVD    Objective:  BP Readings from Last 3 Encounters:  07/11/23 (!) 150/87  05/16/23 135/77  05/12/23 121/67    Lab Results  Component Value Date   HGBA1C 8.4 (A) 05/12/2023    Lab Results  Component Value Date   CREATININE 0.92 05/12/2023   BUN 14 05/12/2023   NA 137 05/12/2023   K  4.3 05/12/2023   CL 101 05/12/2023   CO2 21 05/12/2023    Lab Results  Component Value Date   CHOL 239 (H) 06/27/2022   HDL 48 06/27/2022   LDLCALC 142 (H) 06/27/2022   LDLDIRECT 73.0 02/18/2019   TRIG 272 (H) 06/27/2022   CHOLHDL 5.0 06/27/2022     Medications Reviewed Today     Reviewed by Brinda Lorain SQUIBB, RPH (Pharmacist) on 07/28/23 at 1619  Med List Status: <None>   Medication Order Taking? Sig Documenting Provider Last Dose Status Informant  Accu-Chek Softclix Lancets lancets [id number]  Use as instructed to check blood sugar daily. Dx E11.65 Oley Bascom RAMAN, NP  Active   acetaminophen  (TYLENOL ) 500 MG tablet [id number]  Take by mouth. [provider]  Active            Med Note SAMULE IHA   Wed Mar 27, 2022  3:20 PM) Prn    amLODipine  (NORVASC ) 10 MG tablet [id number] Yes Take 1 tablet (10 mg total) by mouth daily. Oley Bascom RAMAN, NP  Active   aspirin  EC 81 MG tablet [id number] Yes Take 1 tablet (81 mg total) by mouth daily. Oley Bascom RAMAN, NP  Active   atorvastatin  (LIPITOR) 40 MG tablet [id number] Yes Take 1 tablet (40 mg total) by mouth daily. Oley Bascom RAMAN, NP  Active   citalopram  (CELEXA ) 20 MG tablet [id number] Yes Take 1 tablet (20 mg total) by mouth daily. For mood [name], [name], MD  Active     Discontinued 01/17/23 0914 (Change in therapy)            Med Note (POWERS, RAVEN M   Thu Jan 09, 2023  9:18 AM) Switch for Pregabalin  on 02/07/22 next month  glipiZIDE  (GLIPIZIDE  XL) 5 MG 24 hr tablet [id number] Yes Take 1 tablet (5 mg total) by mouth daily with breakfast. Oley Bascom RAMAN, NP  Active            Med Note DELSA LORAIN SQUIBB   Mon Jun 16, 2023  2:33 PM) Taking ~9AM  ibuprofen (ADVIL) 800 MG tablet [id number]  Take by mouth. [provider]  Active            Med Note ZENA, CHERYL A   Tue Apr 09, 2022  2:43 PM) As needed  lisinopril  (ZESTRIL ) 40 MG tablet [id number] Yes Take 1 tablet (40 mg total) by mouth daily. Oley Bascom RAMAN, NP  Active   metFORMIN  (GLUCOPHAGE -XR) 500 MG 24 hr tablet [id number] Yes Take 2 tablets (1,000 mg total) by mouth 2 (two) times daily with a meal. Oley Bascom RAMAN, NP  Active   metoprolol  tartrate (LOPRESSOR ) 25 MG tablet [id number] Yes Take 1 tablet (25 mg total) by  mouth 2 (two) times daily. For blood pressure Oley Bascom RAMAN, NP  Active   pregabalin  (LYRICA ) 150 MG capsule [id number] Yes Take 1 capsule (150 mg total) by mouth in the morning, at noon, and at bedtime. Oley Bascom RAMAN, NP  Active   traMADol  (ULTRAM ) 50 MG tablet [id number] Yes Take 2 tablets (100 mg total) by mouth in the morning, at noon, and at bedtime. For chronic pain [name], [name], MD  Active               Assessment/Plan:   Diabetes: - Currently uncontrolled with last A1C of 8.4% above goal < 7%. Patient is a great candidate for a GLP-1RA with ASCVD history, T2DM, and BMI > 30. However, patient is not willing to start  an injectable medication at this time. Patient denied BG > 200 mg/dL. He did have one episode of hypoglycemia, likely due to missing meal while taking sulfonylurea. Discussed that replacing glipizide  with Trulicity would decrease risk of hypoglycemia, but patient was still not amenable to switching therapy.  Hesitant to continue increasing glipizide  given residual deficits after stroke which likely increase risk of falls. Fill history is appropriate.  - Reviewed long term cardiovascular and renal outcomes of uncontrolled blood sugar - Reviewed goal A1c, goal fasting, and goal 2 hour post prandial glucose - Extensively discussed diet and lifestyle recommendations to help control BG including reducing intake of carbohydrate foods and following the healthy plate method. - Recommend to continue glipizide  XL 5 mg daily, metformin  XR 1000 mg BID - Discussed risk/benefit of addition of Trulicity today, patient not willing to start injectable medication at this time. Will re-discuss at follow-up, especially if next A1C continues to be > 7%. No evidence of pancreatitis, MEN2 in chart, but need to confirm with patient.  - Recommend to check glucose once daily fasting and occasionally 2 hr PPG.  - Due for repeat A1C in July 2025. Due for yearly UACR at next PCP appt.    Hypertension: - Currently controlled - Recommend to continue amlodipine  10 mg daily, lisinopril  40 mg daily, metoprolol  tartrate 25 mg BID.  - Confirmed that patient has refills left on all his medications.    Hyperlipidemia/ASCVD Risk Reduction: - Currently uncontrolled with last LDL-C 142 mg/dL, however patient was not adherent to statin at time of lab. Due for repeat lipid panel - Reviewed long term complications of uncontrolled cholesterol - Recommend to continue atorvastatin  40 mg daily  - Due for repeat lipid panel at PCP f/u  Follow Up Plan: PCP 09/05/23, PharmD telephone 09/22/23  Lorain Baseman, PharmD [facility] [phone]

## 2023-08-11 ENCOUNTER — Ambulatory Visit: Payer: Self-pay | Admitting: Nurse Practitioner

## 2023-08-22 ENCOUNTER — Other Ambulatory Visit: Payer: Self-pay | Admitting: Nurse Practitioner

## 2023-08-22 ENCOUNTER — Other Ambulatory Visit: Payer: Self-pay

## 2023-08-22 DIAGNOSIS — G6281 Critical illness polyneuropathy: Secondary | ICD-10-CM

## 2023-08-22 DIAGNOSIS — E11621 Type 2 diabetes mellitus with foot ulcer: Secondary | ICD-10-CM

## 2023-08-22 MED ORDER — GLIPIZIDE ER 5 MG PO TB24
5.0000 mg | ORAL_TABLET | Freq: Every day | ORAL | 2 refills | Status: DC
  Filled 2023-08-22: qty 30, 30d supply, fill #0

## 2023-08-22 MED ORDER — PREGABALIN 150 MG PO CAPS
150.0000 mg | ORAL_CAPSULE | Freq: Three times a day (TID) | ORAL | 1 refills | Status: DC
  Filled 2023-08-22: qty 90, 30d supply, fill #0

## 2023-08-23 ENCOUNTER — Other Ambulatory Visit: Payer: Self-pay

## 2023-08-25 ENCOUNTER — Other Ambulatory Visit: Payer: Self-pay

## 2023-08-29 ENCOUNTER — Other Ambulatory Visit: Payer: Self-pay

## 2023-09-01 ENCOUNTER — Ambulatory Visit: Admitting: Physical Medicine and Rehabilitation

## 2023-09-02 ENCOUNTER — Other Ambulatory Visit: Payer: Self-pay

## 2023-09-03 ENCOUNTER — Other Ambulatory Visit: Payer: Self-pay

## 2023-09-05 ENCOUNTER — Ambulatory Visit: Payer: Self-pay | Admitting: Nurse Practitioner

## 2023-09-05 ENCOUNTER — Encounter: Payer: Self-pay | Admitting: Physical Medicine and Rehabilitation

## 2023-09-05 ENCOUNTER — Encounter: Attending: Physical Medicine and Rehabilitation | Admitting: Physical Medicine and Rehabilitation

## 2023-09-05 ENCOUNTER — Other Ambulatory Visit: Payer: Self-pay

## 2023-09-05 VITALS — BP 148/93 | HR 62 | Ht 67.0 in | Wt 189.0 lb

## 2023-09-05 DIAGNOSIS — H532 Diplopia: Secondary | ICD-10-CM | POA: Insufficient documentation

## 2023-09-05 DIAGNOSIS — G894 Chronic pain syndrome: Secondary | ICD-10-CM | POA: Diagnosis not present

## 2023-09-05 DIAGNOSIS — S98119A Complete traumatic amputation of unspecified great toe, initial encounter: Secondary | ICD-10-CM | POA: Insufficient documentation

## 2023-09-05 DIAGNOSIS — Z9989 Dependence on other enabling machines and devices: Secondary | ICD-10-CM | POA: Insufficient documentation

## 2023-09-05 DIAGNOSIS — G6281 Critical illness polyneuropathy: Secondary | ICD-10-CM | POA: Diagnosis not present

## 2023-09-05 DIAGNOSIS — G8111 Spastic hemiplegia affecting right dominant side: Secondary | ICD-10-CM | POA: Insufficient documentation

## 2023-09-05 MED ORDER — TRAMADOL HCL 50 MG PO TABS
100.0000 mg | ORAL_TABLET | Freq: Three times a day (TID) | ORAL | 5 refills | Status: DC
  Filled 2023-09-05: qty 180, 30d supply, fill #0
  Filled 2023-11-24 (×2): qty 180, 30d supply, fill #1

## 2023-09-05 MED ORDER — CITALOPRAM HYDROBROMIDE 20 MG PO TABS
20.0000 mg | ORAL_TABLET | Freq: Every day | ORAL | 1 refills | Status: DC
  Filled 2023-09-05 – 2023-11-18 (×2): qty 90, 90d supply, fill #0

## 2023-09-05 MED ORDER — PREGABALIN 150 MG PO CAPS
150.0000 mg | ORAL_CAPSULE | Freq: Three times a day (TID) | ORAL | 5 refills | Status: DC
  Filled 2023-09-05: qty 90, 30d supply, fill #0
  Filled 2023-11-24 (×2): qty 90, 30d supply, fill #1

## 2023-09-05 NOTE — Progress Notes (Signed)
 Subjective:    Patient ID: Andrew Bruce, male    DOB: 1965-08-22, 58 y.o.   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 Original admission 01/26/19- and came to CIR 03/23/19- discharged- 04/20/19.  Has visual difficulties- double vision due to stroke.  Here for f/u on stroke as well as nerve pain and DM.    Here- with interpretor   Still hurting in legs   Pain still in legs- had Qutenza  by my partner Dr Harley- it worked some- used to feel burning on the bottom of feet. Numbness won't going to go away.   Weight was 193 last visit, and now 189 lbs- today  Is able to lift RUE above head now- which is better-  But still numb on that entire R side. Face, RUE/RLE.   Has to use glasses now to compensate-  had double vision initially, but now has prisms in eyeglasses, it sounds like- so doesn't have double vision anymore   Also has new bifocals.  Is starting to drive, Eyes still hurting when reads-  Wants to get a bigger bifocal area.    Was taking 5 tabs/day of Tramadol  when pain bad, and 3x/day normally.  Has 4 refills left per chart, but was told needed to ask me to get refills.  Last picked it up in July- so ran out of Tramadol  2-3 days ago.  Taking Lyrica  150 mg 3x/day- - has run out of it- per pt, but didn't bring it today-  last filled 08/22/23-per chart? has 2 refills left.  Taking Celexa -   Cancelled appt with PCP today at 2pm-   Mind is also more clear lately!!  Pain Inventory Average Pain 6 Pain Right Now 6 My pain is dull  In the last 24 hours, has pain interfered with the  following? General activity 7 Relation with others 7 Enjoyment of life 6 What TIME of day is your pain at its worst? morning  Sleep (in general) Fair  Pain is worse with: sitting and standing Pain improves with: medication Relief from Meds: 9  Family History  Problem Relation Age of Onset   Healthy Sister    Healthy Brother    Social History   Socioeconomic History   Marital status: Married    Spouse name: Not on file   Number of children: 8   Years of education: Not on file   Highest education level: 6th grade  Occupational History   Not on file  Tobacco Use   Smoking status: Former   Smokeless tobacco: Never  Vaping Use   Vaping status: Never Used  Substance and Sexual Activity   Alcohol use: Not Currently    Comment: rare to occasional   Drug use: Never   Sexual activity: Yes    Birth control/protection: None  Other Topics Concern   Not on file  Social History Narrative   Not on file   Social Drivers of Health   Financial Resource Strain: High Risk (09/21/2021)   Received from [facility]   Overall Financial Resource Strain (CARDIA)  Difficulty of Paying Living Expenses: Very hard  Food Insecurity: Food Insecurity Present (05/12/2023)   Hunger Vital Sign    Worried About Running Out of Food in the Last Year: Often true    Ran Out of Food in the Last Year: Often true  Transportation Needs: No Transportation Needs (05/12/2023)   PRAPARE - Administrator, Civil Service (Medical): No    Lack of Transportation (Non-Medical): No  Physical Activity: Not on file  Stress: Not on file  Social Connections: Not on file   Past Surgical History:  Procedure Laterality Date   AMPUTATION Right 08/13/2019   Procedure: AMPUTATION RIGHT FIRST AND SECOND TOES;  Surgeon: Oris Krystal FALCON, MD;  Location: [facility] OR;  Service: Vascular;  Laterality: Right;   DIRECT LARYNGOSCOPY Bilateral 04/06/2019   Procedure: MICRO DIRECT LARYNGOSCOPY WITH PROLARYN INJECTION;   Surgeon: Carlie Clark, MD;  Location[facility] OR;  Service: ENT;  Laterality: Bilateral;   IR GASTROSTOMY TUBE MOD SED  03/10/2019   IR GASTROSTOMY TUBE REMOVAL  04/20/2019   WOUND DEBRIDEMENT Left 08/13/2019   Procedure: AMPUTATION  OF LEFT FIRST TOE, AND DEBRIDEMENT OF LEFT SECOND, THIRD AND FOURTH TOES;  Surgeon: Oris Krystal FALCON, MD;  Location: [facility] OR;  Service: Vascular;  Laterality: Left;   Past Surgical History:  Procedure Laterality Date   AMPUTATION Right 08/13/2019   Procedure: AMPUTATION RIGHT FIRST AND SECOND TOES;  Surgeon: Oris Krystal FALCON, MD;  Location: [facility] OR;  Service: Vascular;  Laterality: Right;   DIRECT LARYNGOSCOPY Bilateral 04/06/2019   Procedure: MICRO DIRECT LARYNGOSCOPY WITH PROLARYN INJECTION;  Surgeon: Carlie Clark, MD;  Location: [facility] OR;  Service: ENT;  Laterality: Bilateral;   IR GASTROSTOMY TUBE MOD SED  03/10/2019   IR GASTROSTOMY TUBE REMOVAL  04/20/2019   WOUND DEBRIDEMENT Left 08/13/2019   Procedure: AMPUTATION  OF LEFT FIRST TOE, AND DEBRIDEMENT OF LEFT SECOND, THIRD AND FOURTH TOES;  Surgeon: Oris Krystal FALCON, MD;  Location: [facility] OR;  Service: Vascular;  Laterality: Left;   Past Medical History:  Diagnosis Date   COVID-19 02/06/2019   Diabetes mellitus without complication (HCC)    Hypertension    PE (pulmonary thromboembolism) (HCC)    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   Pneumonia    with Covid   Stroke (HCC)    weakness on right side, occurred while he had Covid   BP (!) 148/93   Pulse 62   Ht 5' 7 (1.702 m)   Wt 189 lb (85.7 kg)   SpO2 96%   BMI 29.60 kg/m   Opioid Risk Score:   Fall Risk Score:  `1  Depression screen PHQ 2/9     05/16/2023    1:22 PM 05/12/2023    1:54 PM 12/27/2022    3:18 PM 06/27/2022    2:51 PM 03/27/2022    3:25 PM 11/30/2021    2:34 PM 09/26/2021    3:44 PM  Depression screen PHQ 2/9  Decreased Interest 0 3 1 3 3 3  0  Down, Depressed, Hopeless 0 3 0 3 3 3  0  PHQ - 2 Score 0 6 1 6 6 6  0  Altered sleeping  3  3 3 3  3   Tired, decreased energy  3 3 3 3  3   Change in appetite  3 3 3 3   0  Feeling bad or failure about yourself   3 2 2 3   0  Trouble concentrating  3 2  3 3  3   Moving slowly or fidgety/restless  3 3 3 3   0  Suicidal thoughts  0 0 0 1  0  PHQ-9 Score  24 17 23 25  9   Difficult doing work/chores  Extremely dIfficult Somewhat difficult Very difficult Very difficult       Review of Systems  Musculoskeletal:        B/L lower leg pain into feet  All other systems reviewed and are negative.      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 Original admission 01/26/19- and came to CIR 03/23/19- discharged- 04/20/19.  Has visual difficulties- double vision due to stroke.  Here for f/u on stroke as well as nerve pain and DM.      Current lens has 28 mm for lens for bifocal, but can get to 35mm.  Will have to wait since will have to pay out of pocket. Wearing the normal size for the lenses.  -his are 58 years [place] [address] maybe [employer]- 35 MM  bifocal lenses  2. Is now able to  drive with prism  eye glasses. We discussed this at length  3.  Con't  Qutenza  with Dr Harley-  Appt is 10/16/23  4.  Con't Celexa - will send in another Rx for 6 months.   5. Has not been taking Lyrica /Pregabalin - shows sent an Rx/prescription on 8/8- but pt said he didn't receive it- will send another prescription and explain pt never received it - doesn't have a bottle today- he's very clear ran out of it  - start back 150 mg at bedtime- for 3 days, then 2x/day x 3 days, then back to  3x/day.   -we discussed why he's on it- and wants to continue it  6. Tramadol - even though pt said Pharmacy told him he cannot get right now, because doesn't have refill, he had 4 refills on his Rx- I will send in again, to hope he can pick it up today- it has 5 refills.    7.  F/U in 3 months- and already has appt with Dr Harley   I spent a total of  36  minutes on total care today- >50% coordination of care- due to  d/w pt about pain meds, qutenza , his diplopia, new eyeglasses, that he needs and how to get a bigger lens for reading.

## 2023-09-05 NOTE — Patient Instructions (Signed)
 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 Original admission 01/26/19- and came to CIR 03/23/19- discharged- 04/20/19.  Has visual difficulties- double vision due to stroke.  Here for f/u on stroke as well as nerve pain and DM.      Current lens has 28 mm for lens for bifocal, but can get to 35mm.  Will have to wait since will have to pay out of pocket. Wearing the normal size for the lenses.  -his are 58 years [place] [address]- maybe [employer]- 35 MM  bifocal lenses  2. Is now able to  drive with prism  eye glasses. We discussed this at length  3.  Con't Qutenza  with Dr Harley-  Appt is 10/16/23  4.  Con't Celexa - will send in another Rx for 6 months.   5. Has not been taking Lyrica /Pregabalin - shows sent an Rx/prescription on 8/8- but pt said he didn't receive it- will send another prescription and explain pt never received it - doesn't have a bottle today- he's very clear ran out of it  - start back 150 mg at bedtime- for 3 days, then 2x/day x 3 days, then back to  3x/day.   -we discussed why he's on it- and wants to continue it  6. Tramadol - even though pt said Pharmacy told him he cannot get right now, because doesn't have refill, he had 4 refills on his Rx- I will send in again, to hope he can pick it up today- it has 5 refills.    7.  F/U in 3 months- and already has appt with Dr Harley

## 2023-09-09 ENCOUNTER — Other Ambulatory Visit: Payer: Self-pay

## 2023-09-16 NOTE — Progress Notes (Unsigned)
 [facility]  PATIENT: Andrew Bruce DOB: 02-Jul-1965  REQUESTING CLINICIAN: Oley Bascom RAMAN, NP HISTORY FROM: Patient via interpretor  REASON FOR VISIT: Stroke follow up    HISTORICAL  CHIEF COMPLAINT:  No chief complaint on file.  HPI:  Update 09/16/2023 JM: Patient returns for yearly stroke follow-up visit.  Has been stable without recurrent stroke/TIA symptoms.  Continued right spastic hemiparesis and right sided dysesthesias.   Received Qutenza  by PMR for polyneuropathy with some improvement, has follow-up visit in October.  He has also continued on pregabalin  per PMR.  Continues to follow with ENT and noting some improvement of voice after voice therapy and recommended to continue.     History provided for reference purposes only INTERVAL HISTORY 09/12/2022 Dr. Gregg:  Patient presents today for follow-up, last visit was a year ago, since then he has been doing well, reports that his strength is improving on the right.  He did see ENT and had vocal cord injection, feels like his voice is getting better.  Denies any recent fall.  He continue to walk with a walker, again report his weakness is improving.  No other complaint or concerns.   CONSULT VISIT 07/25/2021 Dr. Gregg: This is a 58 year old gentleman with past medical history of multiple strokes in the setting of COVID infection, hypertension, hyperlipidemia and diabetes mellitus type 2 who is presenting with complaint of right-sided weakness and numbness, bilateral legs pain and throat pain.  Patient reports prior to his COVID infection in 2021 he was working at [employer] Friday, independent and has no deficit.  With the COVID infection, he was in the coma for 50-months, he was intubated and had a tracheostomy.  During this time, he did have MRI brain which showed multiple cardioembolic strokes involving both cerebral and cerebellar hemispheres.  Since discharge from the hospital he has completed rehab but  still have lingering right-sided deficit.  He reported deficit is mainly weakness on the right and right side pain.  He is compliant with his medication.  He is pending referral to ENT regarding his throat pain.     OTHER MEDICAL CONDITIONS: Multiple strokes, Hypertension, DMII, Hyperlipidemia    REVIEW OF SYSTEMS: Full 14 system review of systems performed and negative with exception of: as noted in the HPI   ALLERGIES: No Known Allergies  HOME MEDICATIONS: Outpatient Medications Prior to Visit  Medication Sig Dispense Refill   Accu-Chek Softclix Lancets lancets Use as instructed to check blood sugar daily. Dx E11.65 100 each 12   acetaminophen  (TYLENOL ) 500 MG tablet Take by mouth.     amLODipine  (NORVASC ) 10 MG tablet Take 1 tablet (10 mg total) by mouth daily. 30 tablet 5   aspirin  EC 81 MG tablet Take 1 tablet (81 mg total) by mouth daily. 100 tablet 3   atorvastatin  (LIPITOR) 40 MG tablet Take 1 tablet (40 mg total) by mouth daily. 90 tablet 3   citalopram  (CELEXA ) 20 MG tablet Take 1 tablet (20 mg total) by mouth daily. For mood 90 tablet 1   glipiZIDE  (GLIPIZIDE  XL) 5 MG 24 hr tablet Take 1 tablet (5 mg total) by mouth daily with breakfast. 30 tablet 2   ibuprofen (ADVIL) 800 MG tablet Take by mouth.     lisinopril  (ZESTRIL ) 40 MG tablet Take 1 tablet (40 mg total) by mouth daily. 30 tablet 5   metFORMIN  (GLUCOPHAGE -XR) 500 MG 24 hr tablet Take 2 tablets (1,000 mg total) by mouth 2 (two) times daily with a meal.  360 tablet 11   metoprolol  tartrate (LOPRESSOR ) 25 MG tablet Take 1 tablet (25 mg total) by mouth 2 (two) times daily. For blood pressure 180 tablet 3   pregabalin  (LYRICA ) 150 MG capsule Take 1 capsule (150 mg total) by mouth in the morning, at noon, and at bedtime. 90 capsule 5   traMADol  (ULTRAM ) 50 MG tablet Take 2 tablets (100 mg total) by mouth in the morning, at noon, and at bedtime. For chronic pain 180 tablet 5   No facility-administered medications prior to  visit.    PAST MEDICAL HISTORY: Past Medical History:  Diagnosis Date   COVID-19 02/06/2019   Diabetes mellitus without complication (HCC)    Hypertension    PE (pulmonary thromboembolism) (HCC)    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   Pneumonia    with Covid   Stroke (HCC)    weakness on right side, occurred while he had Covid    PAST SURGICAL HISTORY: Past Surgical History:  Procedure Laterality Date   AMPUTATION Right 08/13/2019   Procedure: AMPUTATION RIGHT FIRST AND SECOND TOES;  Surgeon: Oris Krystal FALCON, MD;  Location: [facility] OR;  Service: Vascular;  Laterality: Right;   DIRECT LARYNGOSCOPY Bilateral 04/06/2019   Procedure: MICRO DIRECT LARYNGOSCOPY WITH PROLARYN INJECTION;  Surgeon: Carlie Clark, MD;  Location: [facility] OR;  Service: ENT;  Laterality: Bilateral;   IR GASTROSTOMY TUBE MOD SED  03/10/2019   IR GASTROSTOMY TUBE REMOVAL  04/20/2019   WOUND DEBRIDEMENT Left 08/13/2019   Procedure: AMPUTATION  OF LEFT FIRST TOE, AND DEBRIDEMENT OF LEFT SECOND, THIRD AND FOURTH TOES;  Surgeon: Oris Krystal FALCON, MD;  Location: [facility] OR;  Service: Vascular;  Laterality: Left;    FAMILY HISTORY: Family History  Problem Relation Age of Onset   Healthy Sister    Healthy Brother     SOCIAL HISTORY: Social History   Socioeconomic History   Marital status: Married    Spouse name: Not on file   Number of children: 8   Years of education: Not on file   Highest education level: 6th grade  Occupational History   Not on file  Tobacco Use   Smoking status: Former   Smokeless tobacco: Never  Vaping Use   Vaping status: Never Used  Substance and Sexual Activity   Alcohol use: Not Currently    Comment: rare to occasional   Drug use: Never   Sexual activity: Yes    Birth control/protection: None  Other Topics Concern   Not on file  Social History Narrative   Not on file   Social Drivers of Health   Financial Resource Strain: High Risk (09/21/2021)   Received [facility]   Overall Financial Resource Strain (CARDIA)    Difficulty of Paying Living Expenses: Very hard  Food Insecurity: Food Insecurity Present (05/12/2023)   Hunger Vital Sign    Worried About Running Out of Food in the Last Year: Often true    Ran Out of Food in the Last Year: Often true  Transportation Needs: No Transportation Needs (05/12/2023)   PRAPARE - Administrator, Civil Service (Medical): No    Lack of Transportation (Non-Medical): No  Physical Activity: Not on file  Stress: Not on file  Social Connections: Not on file  Intimate Partner Violence: Not on file    PHYSICAL EXAM  GENERAL EXAM/CONSTITUTIONAL: Vitals:  There were no vitals filed for this visit.  There is no height or  weight on file to calculate BMI. Wt Readings from Last 3 Encounters:  09/05/23 189 lb (85.7 kg)  07/11/23 196 lb 3.2 oz (89 kg)  05/16/23 193 lb (87.5 kg)   Patient is in no distress; well developed, nourished and groomed; neck is supple  MUSCULOSKELETAL: Gait, strength, tone, movements noted in Neurologic exam below  NEUROLOGIC: MENTAL STATUS:      No data to display         awake, alert, oriented to person, place and time recent and remote memory intact normal attention and concentration language fluent, comprehension intact, naming intact fund of knowledge appropriate  CRANIAL NERVE:  2nd, 3rd, 4th, 6th - He has a right visual field cut, extraocular muscles intact, no nystagmus 5th - facial sensation symmetric 7th - facial strength symmetric 8th - hearing intact 9th - palate elevates symmetrically, uvula midline 11th - shoulder shrug symmetric 12th - tongue protrusion midline  MOTOR:  normal bulk and tone, full strength in the LUE, LLE. RUE and LLE 4+/5 strenght in all muscle groups, including finger intrinsics; sometimes he will have give away weakness due to pain  SENSORY:  Decrease sensation to light touch in the right upper and lower extremities    COORDINATION:  Slow finger-nose-finger, fine finger movements normal  REFLEXES:  deep tendon reflexes present and symmetric  GAIT/STATION:  Mild hemiplegic gait, walks with a cane    DIAGNOSTIC DATA (LABS, IMAGING, TESTING) - I reviewed patient records, labs, notes, testing and imaging myself where available.  Lab Results  Component Value Date   WBC 5.6 05/12/2023   HGB 13.3 05/12/2023   HCT 39.3 05/12/2023   MCV 87 05/12/2023   PLT 274 05/12/2023      Component Value Date/Time   NA 137 05/12/2023 1419   K 4.3 05/12/2023 1419   CL 101 05/12/2023 1419   CO2 21 05/12/2023 1419   GLUCOSE 167 (H) 05/12/2023 1419   GLUCOSE 119 (H) 08/13/2019 0844   BUN 14 05/12/2023 1419   CREATININE 0.92 05/12/2023 1419   CALCIUM  9.0 05/12/2023 1419   PROT 6.8 05/12/2023 1419   ALBUMIN  4.3 05/12/2023 1419   AST 18 05/12/2023 1419   ALT 26 05/12/2023 1419   ALKPHOS 78 05/12/2023 1419   BILITOT 0.8 05/12/2023 1419   GFRNONAA 104 03/06/2020 1414   GFRAA 120 03/06/2020 1414   Lab Results  Component Value Date   CHOL 239 (H) 06/27/2022   HDL 48 06/27/2022   LDLCALC 142 (H) 06/27/2022   LDLDIRECT 73.0 02/18/2019   TRIG 272 (H) 06/27/2022   CHOLHDL 5.0 06/27/2022   Lab Results  Component Value Date   HGBA1C 8.4 (A) 05/12/2023   Lab Results  Component Value Date   VITAMINB12 759 03/24/2019   Lab Results  Component Value Date   TSH 4.156 03/21/2019    MRI Brain 03/19/2019 1. No acute intracranial abnormality identified. 2. Subacute infarcts scattered throughout the brain parenchyma including bilateral frontoparietal regions, splenium of corpus callosum on the right side and most significantly left occipital lobe. 3. Chronic bilateral cerebellar infarcts.    ASSESSMENT AND PLAN  58 y.o. year old male with history of hypertension, hyperlipidemia, diabetes mellitus, COVID infection in 2021 complicated by hypoxic respiratory failure s/p trach, multiple arterial/venous  thromboembolic complications including multiple cardioembolic strokes, aortic thrombus, pulmonary embolism and ischemic toes.   Cardioembolic strokes Residual deficits: Right spastic hemiparesis, dysesthesias and diplopia.  Continue to follow with PMR as scheduled.  Continue to follow with ophthalmology with  use of prism  lenses for diplopia. Continue aspirin  81 mg daily and atorvastatin  40 mg daily for secondary stroke prevention managed/prescribed by PCP Continue close PCP follow up for aggressive stroke risk factor management    Patient stroke etiology likely hypercoagulable state due to COVID 19.  He did completed physical therapy but still have mild right-sided deficit but continue to improve.  Plan will be for patient to continue current medications, continue exercising at home, continue to follow with his doctors and I will see him in 1 year for follow-up or sooner if worse.  He voiced understanding.        I personally spent a total of *** minutes in the care of the patient today including {Time Based Coding:[id number]}.  Harlene Bogaert, AGNP-BC  [facility] [address] [place], [place] [address]  Phone [phone] Fax [phone] Note: This document was prepared with digital dictation and possible smart phrase technology. Any transcriptional errors that result from this process are unintentional.

## 2023-09-17 ENCOUNTER — Ambulatory Visit (INDEPENDENT_AMBULATORY_CARE_PROVIDER_SITE_OTHER): Payer: Medicare Other | Admitting: Adult Health

## 2023-09-17 ENCOUNTER — Encounter: Payer: Self-pay | Admitting: Adult Health

## 2023-09-17 ENCOUNTER — Other Ambulatory Visit: Payer: Self-pay

## 2023-09-17 VITALS — BP 142/86 | HR 78 | Ht 66.0 in | Wt 192.0 lb

## 2023-09-17 DIAGNOSIS — I63433 Cerebral infarction due to embolism of bilateral posterior cerebral arteries: Secondary | ICD-10-CM

## 2023-09-17 DIAGNOSIS — I69359 Hemiplegia and hemiparesis following cerebral infarction affecting unspecified side: Secondary | ICD-10-CM | POA: Diagnosis not present

## 2023-09-17 NOTE — Patient Instructions (Signed)
 Continue aspirin  81 mg daily  and atorvastatin  40mg  daily  for secondary stroke prevention  Repeat cholesterol levels with your PCP at follow up visit, if your LDL remains above 70, would recommend increasing atorvastatin  dosage to 80mg  daily   Please follow up with Dr. [name] regarding pregablin - would recommend you try to restart pregablin 1 capsule daily until you see Dr. [name] again. If dizziness returns, please let Dr. [name] know, you may need to try a lower dosage   Continue to follow up with PCP regarding cholesterol, blood pressure and diabetes management Maintain strict control of hypertension with blood pressure goal below 130/90, diabetes with hemoglobin A1c goal below 7.0 % and cholesterol with LDL cholesterol (bad cholesterol) goal below 70 mg/dL.   Signs of a Stroke? Follow the BEFAST method:  Balance Watch for a sudden loss of balance, trouble with coordination or vertigo Eyes Is there a sudden loss of vision in one or both eyes? Or double vision?  Face: Ask the person to smile. Does one side of the face droop or is it numb?  Arms: Ask the person to raise both arms. Does one arm drift downward? Is there weakness or numbness of a leg? Speech: Ask the person to repeat a simple phrase. Does the speech sound slurred/strange? Is the person confused ? Time: If you observe any of these signs, call 911.        Thank you for coming to see us  at [facility]. I hope we have been able to provide you high quality care today.  You may receive a patient satisfaction survey over the next few weeks. We would appreciate your feedback and comments so that we may continue to improve ourselves and the health of our patients.

## 2023-09-22 ENCOUNTER — Other Ambulatory Visit: Payer: Self-pay

## 2023-09-22 DIAGNOSIS — I63119 Cerebral infarction due to embolism of unspecified vertebral artery: Secondary | ICD-10-CM

## 2023-09-22 MED ORDER — ATORVASTATIN CALCIUM 40 MG PO TABS
40.0000 mg | ORAL_TABLET | Freq: Every day | ORAL | 3 refills | Status: AC
  Filled 2023-09-22: qty 90, 90d supply, fill #0
  Filled 2024-02-11: qty 90, 90d supply, fill #1

## 2023-09-22 NOTE — Progress Notes (Signed)
 09/22/2023 Name: Andrew Bruce MRN: [id number] DOB: [birth year]  Chief Complaint  Patient presents with   Diabetes   Hypertension   Hyperlipidemia    Andrew Bruce is a 58 y.o. year old male who presented for a telephone visit. Outreach patient using [employer], [place] #[id number].   They were referred to the pharmacist by their PCP for assistance in managing diabetes. 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   Subjective:  Patient was engaged by pharmacy via telephone on 06/16/23. At this appointment he reported taking metformin  and glipizide  as prescribed. He was not willing to start injectable Trulicity at this appointment (fear of injections). At last pharmacy telephone appt on 07/28/23, patient reported that his BG were under control. Patient reported episode of hypoglycemia associated with a sulfonylurea, but was still unwilling to trial Trulicity. He rescheduled his PCP appts in July and August, so has not yet had a repeat A1C.  Patient reports that he is doing ok today.   Care Team: Primary Care Provider: Oley Bascom RAMAN, NP ; Next Scheduled Visit: 10/01/23  Medication Access/Adherence  Current Pharmacy:  [facility] - [facility] [address] [place] [address] Phone: [phone] Fax: [phone]  [facility] [address] [place] [address] Phone: [phone] Fax: [phone]   Patient reports affordability concerns with their medications: No  - getting medications via [facility] Patient reports access/transportation concerns to their pharmacy: No  Patient reports adherence concerns with their medications:  No  - denies missed doses of his medications. Reports that his wife also helps him remember his medications.    Diabetes:   Current medications: glipizide  XL 5 mg daily (reports taking, but last  filled 07/21/23 for 30ds), metformin  XR 1000 mg BID (reports that since his BG have been lower, he is only taking one tablet once daily - thought that it was a 1000 mg tablet)  Medications tried in the past: n/a  Current glucose readings: Checking once daily fasting: recalls readings of 121, 123 mg/dL  873 mg/dL yesterday afternoon. Denies BG > 200 mg/dL. Denies BG < 70 mg/dL Using glucometer; unknown brand.   Patient denies hyperglycemic symptoms including polydipsia, polyphagia, nocturia, blurred vision. He does report polyuria which is stable for him. He also has LE neuropathy (recently seen by Dr. Urbano for physical rehabilitation).  Current meal patterns: Eating 3 meals a day. Previously was having 2 tortillas with each meal (6 total daily) and rice with many of his meals > today, reports that he has cut back on tortillas and rice. Drinks: Water  throughout the day (up to 4x 16 oz). Coffee in the morning. No soda or juice.   Current physical activity: Walks every afternoon.   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 Risk Factors: ASCVD, HLD, HTN, T2DM   Clinical ASCVD: Yes  The ASCVD Risk score (Arnett DK, et al., 2019) failed to calculate for the following reasons:   Risk score cannot be calculated because patient has a medical history suggesting prior/existing ASCVD    Objective:  BP Readings from Last 3 Encounters:  09/17/23 (!) 142/86  09/05/23 (!) 148/93  07/11/23 (!) 150/87   Lab Results  Component Value Date   HGBA1C 8.4 (A) 05/12/2023   HGBA1C 7.2 (A) 12/27/2022   HGBA1C 8.0 (A) 09/27/2022    Lab Results   Component Value Date   CREATININE 0.92 05/12/2023   BUN 14 05/12/2023   NA 137 05/12/2023   K 4.3 05/12/2023   CL 101 05/12/2023   CO2 21 05/12/2023    Lab Results  Component Value Date   CHOL 239 (H) 06/27/2022   HDL 48 06/27/2022   LDLCALC 142 (H) 06/27/2022   LDLDIRECT 73.0 02/18/2019   TRIG 272 (H) 06/27/2022   CHOLHDL 5.0 06/27/2022    Medications Reviewed Today   Medications were not reviewed in this encounter       Assessment/Plan:   Diabetes: - Currently uncontrolled with last A1C of 8.4% above goal < 7%. Patient is a great candidate for a GLP-1RA with ASCVD history, T2DM, and BMI > 30. However, patient is not willing to start an injectable medication at this time. Patient denied BG > 200 mg/dL and has not had another BG < 70 mg/dL since our last call. Will continue to monitor for hypoglycemia given current tx with SU. Encouraged patient to take full-dose metformin , since fasting sugars likely do not provide the full picture of his BG fluctuation throughout the day. Would not recommend further increasing glipizide  given residual deficits s/p stroke. - Reviewed long term cardiovascular and renal outcomes of uncontrolled blood sugar - Reviewed goal A1c, goal fasting, and goal 2 hour post prandial glucose - Commended patient for diet and lifestyle changes to help control BG including reducing intake of carbohydrate foods like rice and tortillas - Recommend to continue glipizide  XL 5 mg daily, metformin  XR 1000 mg BID. Encouraged patient to take full-dose metformin .  - Instructed patient to bring his medication bottles to his upcoming appt (glipizide  last filled 07/21/23 for 30ds) - Previously discussed risk/benefit of addition of Trulicity, but patient not willing to start injectable medication. Will re-discuss at follow-up, especially if next A1C continues to be > 7%. No evidence of pancreatitis, MEN2 in chart, but need to confirm with patient.  - Recommend to check glucose  once daily fasting and occasionally 2 hr PPG.  - Due for repeat A1C and yearly UACR NOW  Hypertension: - Currently uncontrolled with recent clinic BP above goal < 130/80 mmHg. Previously well controlled - will confirm adherence at upcoming appt. Could consider addition of hydrochlorothiazide if additional BP control needed.  - Recommend to continue amlodipine  10 mg daily, lisinopril  40 mg daily, metoprolol  tartrate 25 mg BID.  - Confirmed that patient has refills left on all his medications.    Hyperlipidemia/ASCVD Risk Reduction: - Currently uncontrolled with last LDL-C 142 mg/dL, however patient was not adherent to statin at time of lab. Due for repeat lipid panel. Provided with refill of atorvastatin  today. - Reviewed long term complications of uncontrolled cholesterol - Recommend to continue atorvastatin  40 mg daily  - Due for repeat lipid panel at PCP f/u  Follow Up Plan: PCP 09/05/23, PharmD telephone 09/22/23  Lorain Baseman, PharmD [facility] [phone]

## 2023-09-25 ENCOUNTER — Other Ambulatory Visit: Payer: Self-pay

## 2023-09-30 ENCOUNTER — Encounter: Payer: Self-pay | Admitting: Nurse Practitioner

## 2023-09-30 ENCOUNTER — Ambulatory Visit (INDEPENDENT_AMBULATORY_CARE_PROVIDER_SITE_OTHER): Payer: Self-pay | Admitting: Nurse Practitioner

## 2023-09-30 VITALS — BP 138/75 | HR 64 | Temp 97.9°F | Wt 190.0 lb

## 2023-09-30 DIAGNOSIS — L97509 Non-pressure chronic ulcer of other part of unspecified foot with unspecified severity: Secondary | ICD-10-CM | POA: Diagnosis not present

## 2023-09-30 DIAGNOSIS — E11621 Type 2 diabetes mellitus with foot ulcer: Secondary | ICD-10-CM | POA: Diagnosis not present

## 2023-09-30 DIAGNOSIS — R531 Weakness: Secondary | ICD-10-CM

## 2023-09-30 DIAGNOSIS — R490 Dysphonia: Secondary | ICD-10-CM | POA: Diagnosis not present

## 2023-09-30 LAB — POCT GLYCOSYLATED HEMOGLOBIN (HGB A1C): Hemoglobin A1C: 9.1 % — AB (ref 4.0–5.6)

## 2023-09-30 NOTE — Progress Notes (Signed)
 Subjective   Patient ID: Andrew Bruce, male    DOB: 26-Feb-1965, 58 y.o.   MRN: [id number]  Chief Complaint  Patient presents with   Medical Management of Chronic Issues    Patient is confused about his metformin      Referring provider: Oley Andrew RAMAN, NP  Andrew Bruce is a 58 y.o. male with Past Medical History: 02/06/2019: COVID-19 No date: Diabetes mellitus without complication (HCC) No date: Hypertension No date: PE (pulmonary thromboembolism) (HCC)     Comment:  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 in setting of COVID               ARDS No date: Pneumonia     Comment:  with Covid No date: Stroke [facility])     Comment:  weakness on right side, occurred while he had Covid   HPI   Diabetes:   Patient presents for diabetic follow up. Currently taking metformin . Will add glipizide  xl daily. Complaint with diabetic diet. Taking medications. A1C today is 9.1. We have placed a referral for diabetic medication management with pharmacist.   Note: patient has not been taking metformin  correctly   Hypertension:   Taking medications as directed. Checking blood pressures at home - within normal range. No issues or concerns.  Denies f/c/s, n/v/d, hemoptysis, PND, leg swelling Denies chest pain or edema  Note: patient continues to have hoarse voice and right side weakness. Will refer to PT and speech therapy.    No Known Allergies  Immunization History  Administered Date(s) Administered   Influenza, Seasonal, Injecte, Preservative Fre 09/27/2022   Influenza,inj,Quad PF,6+ Mos 03/25/2019, 11/17/2020   Pneumococcal Polysaccharide-23 03/25/2019, 11/17/2020    Tobacco History: Social History   Tobacco Use  Smoking Status Former  Smokeless Tobacco Never   Counseling given: Not Answered   Outpatient Encounter Medications as of 09/30/2023  Medication Sig   Accu-Chek Softclix Lancets lancets Use as instructed to check blood sugar daily. Dx E11.65   acetaminophen   (TYLENOL ) 500 MG tablet Take by mouth.   amLODipine  (NORVASC ) 10 MG tablet Take 1 tablet (10 mg total) by mouth daily.   aspirin  EC 81 MG tablet Take 1 tablet (81 mg total) by mouth daily.   atorvastatin  (LIPITOR) 40 MG tablet Take 1 tablet (40 mg total) by mouth daily.   citalopram  (CELEXA ) 20 MG tablet Take 1 tablet (20 mg total) by mouth daily. For mood   glipiZIDE  (GLIPIZIDE  XL) 5 MG 24 hr tablet Take 1 tablet (5 mg total) by mouth daily with breakfast.   ibuprofen (ADVIL) 800 MG tablet Take by mouth.   lisinopril  (ZESTRIL ) 40 MG tablet Take 1 tablet (40 mg total) by mouth daily.   metFORMIN  (GLUCOPHAGE -XR) 500 MG 24 hr tablet Take 2 tablets (1,000 mg total) by mouth 2 (two) times daily with a meal.   metoprolol  tartrate (LOPRESSOR ) 25 MG tablet Take 1 tablet (25 mg total) by mouth 2 (two) times daily. For blood pressure   pregabalin  (LYRICA ) 150 MG capsule Take 1 capsule (150 mg total) by mouth in the morning, at noon, and at bedtime.   traMADol  (ULTRAM ) 50 MG tablet Take 2 tablets (100 mg total) by mouth in the morning, at noon, and at bedtime. For chronic pain   [DISCONTINUED] gabapentin  (NEURONTIN ) 100 MG capsule Take 1 capsule (100 mg total) by mouth at bedtime.   No facility-administered encounter medications on file as of 09/30/2023.    Review of Systems  Review of Systems  Constitutional: Negative.  HENT: Negative.    Cardiovascular: Negative.   Gastrointestinal: Negative.   Allergic/Immunologic: Negative.   Neurological: Negative.   Psychiatric/Behavioral: Negative.       Objective:   BP 138/75   Pulse 64   Temp 97.9 F (36.6 C) (Oral)   Wt 190 lb (86.2 kg)   SpO2 98%   BMI 30.67 kg/m   Wt Readings from Last 5 Encounters:  09/30/23 190 lb (86.2 kg)  09/17/23 192 lb (87.1 kg)  09/05/23 189 lb (85.7 kg)  07/11/23 196 lb 3.2 oz (89 kg)  05/16/23 193 lb (87.5 kg)     Physical Exam Vitals and nursing note reviewed.  Constitutional:      General: He is not  in acute distress.    Appearance: He is well-developed.  Cardiovascular:     Rate and Rhythm: Normal rate and regular rhythm.  Pulmonary:     Effort: Pulmonary effort is normal.     Breath sounds: Normal breath sounds.  Skin:    General: Skin is warm and dry.  Neurological:     Mental Status: He is alert and oriented to person, place, and time.       Assessment & Plan:   Type 2 diabetes, controlled, with ulcer of toe (HCC) -     POCT glycosylated hemoglobin (Hb A1C) -     AMB Referral [facility] Care Management  Right sided weakness -     Ambulatory referral to Physical Therapy  Hoarseness of voice -     Ambulatory referral to Speech Therapy     Return in about 3 months (around 12/30/2023).   Andrew GORMAN Borer, NP 09/30/2023

## 2023-10-01 ENCOUNTER — Ambulatory Visit: Payer: Self-pay | Admitting: Nurse Practitioner

## 2023-10-16 ENCOUNTER — Encounter: Attending: Physical Medicine and Rehabilitation | Admitting: Physical Medicine & Rehabilitation

## 2023-10-16 DIAGNOSIS — G894 Chronic pain syndrome: Secondary | ICD-10-CM | POA: Insufficient documentation

## 2023-10-16 DIAGNOSIS — G6281 Critical illness polyneuropathy: Secondary | ICD-10-CM | POA: Insufficient documentation

## 2023-10-16 DIAGNOSIS — G8111 Spastic hemiplegia affecting right dominant side: Secondary | ICD-10-CM | POA: Insufficient documentation

## 2023-10-16 DIAGNOSIS — Z9989 Dependence on other enabling machines and devices: Secondary | ICD-10-CM | POA: Insufficient documentation

## 2023-10-16 DIAGNOSIS — H532 Diplopia: Secondary | ICD-10-CM | POA: Insufficient documentation

## 2023-10-16 DIAGNOSIS — S98119A Complete traumatic amputation of unspecified great toe, initial encounter: Secondary | ICD-10-CM | POA: Insufficient documentation

## 2023-11-12 ENCOUNTER — Ambulatory Visit: Payer: Self-pay

## 2023-11-12 DIAGNOSIS — L97509 Non-pressure chronic ulcer of other part of unspecified foot with unspecified severity: Secondary | ICD-10-CM | POA: Diagnosis not present

## 2023-11-12 DIAGNOSIS — E11621 Type 2 diabetes mellitus with foot ulcer: Secondary | ICD-10-CM

## 2023-11-12 LAB — POCT GLYCOSYLATED HEMOGLOBIN (HGB A1C): HbA1c, POC (controlled diabetic range): 8.4 % — AB (ref 0.0–7.0)

## 2023-11-12 NOTE — Progress Notes (Deleted)
 11/12/2023 Name: Andrew Bruce MRN: [id number] DOB: 01/10/1966  No chief complaint on file.   Andrew Bruce is a 58 y.o. year old male who presented for a face-to-face visit. [employer] ID [phone].    They were referred to the pharmacist by their PCP for assistance in managing diabetes. 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   Subjective:  Patient was engaged by pharmacy via telephone on 06/16/23. At this appointment he reported taking metformin  and glipizide  as prescribed. He was not willing to start injectable Trulicity at this appointment (fear of injections). At last pharmacy telephone appt on 07/28/23, patient reported that his BG were under control. Patient reported episode of hypoglycemia associated with a sulfonylurea, but was still unwilling to trial Trulicity. He rescheduled his PCP appts in July and August, so has not yet had a repeat A1C.  Patient reports that he is doing ok today.   Stopped taking pregabalin  due to dizziness -   Care Team: Primary Care Provider: Oley Bascom RAMAN, NP ; Next Scheduled Visit: 10/01/23  Medication Access/Adherence  Current Pharmacy:  [facility] - [facility] [address] [place] [address] Phone: [phone] Fax: [phone]  [facility] [address], [address] [place] [address] Phone: [phone] Fax: [phone]   Patient reports affordability concerns with their medications: No  - getting medications via [facility] Patient reports access/transportation concerns to their pharmacy: No  Patient reports adherence concerns with their medications:  No  - denies missed doses of his medications. Reports that his wife also helps him remember his medications.   Reports forgetting doses of medications ~1 time/week.  Diabetes:   Current medications: glipizide  XL 5 mg  daily (reports taking, but last filled 07/21/23 for 30ds), metformin  XR 1000 mg BID (not filled since July 2025 - reports taking 500 mg BID)  Reports constipation with metformin   Medications tried in the past: n/a  Current glucose readings: Checking once daily fasting occasionally: 123 mg/dL on Monday  873 mg/dL yesterday afternoon. Denies BG > 200 mg/dL. Denies BG < 70 mg/dL Using glucometer; unknown brand.   Patient denies hyperglycemic symptoms including polydipsia, polyphagia, nocturia, blurred vision. He does report polyuria which is stable for him. He also has LE neuropathy (recently seen by Dr. Urbano for physical rehabilitation).  Current meal patterns: Eating 3 meals a day. Previously was having 2 tortillas with each meal (6 total daily) and rice with many of his meals > today, reports that he has cut back on tortillas and rice. Drinks: Water  throughout the day (up to 4x 16 oz). Coffee in the morning. No soda or juice.   Current physical activity: Walks every afternoon.   Current medication access support: getting medications via [facility] supply  Hypertension:  Current medications: lisinopril  40 mg daily, amlodipine  10 mg daily (last filled 09/17/23 for 30ds), metoprolol  tartrate 25 mg BID   Hyperlipidemia/ASCVD Risk Reduction  Current lipid lowering medications: atorvastatin  40 mg daily  Antiplatelet regimen: aspirin  81 mg daily  ASCVD History: embolic stroke during COVID-19 infection in 2020 Risk Factors: ASCVD, HLD, HTN, T2DM   Clinical ASCVD: Yes  The ASCVD Risk score (Arnett DK, et al., 2019) failed to calculate for the following reasons:   Risk score cannot be calculated because patient has a medical history suggesting prior/existing ASCVD    Objective:  BP Readings from Last 3 Encounters:  09/30/23 138/75  09/17/23 (!) 142/86  09/05/23 (!) 148/93   Lab Results  Component Value Date   HGBA1C 9.1 (A) 09/30/2023   HGBA1C 8.4 (A) 05/12/2023   HGBA1C 7.2 (A)  12/27/2022    Lab Results  Component Value Date   CREATININE 0.92 05/12/2023   BUN 14 05/12/2023   NA 137 05/12/2023   K 4.3 05/12/2023   CL 101 05/12/2023   CO2 21 05/12/2023    Lab Results  Component Value Date   CHOL 239 (H) 06/27/2022   HDL 48 06/27/2022   LDLCALC 142 (H) 06/27/2022   LDLDIRECT 73.0 02/18/2019   TRIG 272 (H) 06/27/2022   CHOLHDL 5.0 06/27/2022    Medications Reviewed Today   Medications were not reviewed in this encounter       Assessment/Plan:   Diabetes: - Currently uncontrolled with last A1C of 8.4% above goal < 7%. Patient is a great candidate for a GLP-1RA with ASCVD history, T2DM, and BMI > 30. However, patient is not willing to start an injectable medication at this time. Patient denied BG > 200 mg/dL and has not had another BG < 70 mg/dL since our last call. Will continue to monitor for hypoglycemia given current tx with SU. Encouraged patient to take full-dose metformin , since fasting sugars likely do not provide the full picture of his BG fluctuation throughout the day. Would not recommend further increasing glipizide  given residual deficits s/p stroke. - Reviewed long term cardiovascular and renal outcomes of uncontrolled blood sugar - Reviewed goal A1c, goal fasting, and goal 2 hour post prandial glucose - Commended patient for diet and lifestyle changes to help control BG including reducing intake of carbohydrate foods like rice and tortillas - Recommend to continue glipizide  XL 5 mg daily, metformin  XR 1000 mg BID. Encouraged patient to take full-dose metformin .  - Instructed patient to bring his medication bottles to his upcoming appt (glipizide  last filled 07/21/23 for 30ds) - Previously discussed risk/benefit of addition of Trulicity, but patient not willing to start injectable medication. Will re-discuss at follow-up, especially if next A1C continues to be > 7%. No evidence of pancreatitis, MEN2 in chart, but need to confirm with patient.   - Recommend to check glucose once daily fasting and occasionally 2 hr PPG.  - Due for repeat A1C and yearly UACR NOW  Hypertension: - Currently uncontrolled with recent clinic BP above goal < 130/80 mmHg. Previously well controlled - will confirm adherence at upcoming appt. Could consider addition of hydrochlorothiazide if additional BP control needed.  - Recommend to continue amlodipine  10 mg daily, lisinopril  40 mg daily, metoprolol  tartrate 25 mg BID.  - Confirmed that patient has refills left on all his medications.    Hyperlipidemia/ASCVD Risk Reduction: - Currently uncontrolled with last LDL-C 142 mg/dL, however patient was not adherent to statin at time of lab. Due for repeat lipid panel. Provided with refill of atorvastatin  today. - Reviewed long term complications of uncontrolled cholesterol - Recommend to continue atorvastatin  40 mg daily  - Due for repeat lipid panel at PCP f/u  Follow Up Plan: PCP 09/05/23, PharmD telephone 09/22/23  Lorain Baseman, PharmD [facility] [phone]

## 2023-11-12 NOTE — Progress Notes (Signed)
 S:     Chief Complaint  Patient presents with   Diabetes   58 y.o. male who presents for diabetes evaluation, education, and management in the context of the LIBERATE Study.  Today, patient arrives in  good spirits and presents without  any assistance. [employer] ID [id number].   They were referred to the pharmacist by their PCP, [name], NP for assistance in managing diabetes. 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   Patient was engaged by pharmacy via telephone on 06/16/23. At this appointment he reported taking metformin  and glipizide  as prescribed. He was not willing to start injectable Trulicity at this appointment (fear of injections). At last pharmacy telephone appt on 07/28/23, patient reported that his BG were under control. Patient reported episode of hypoglycemia associated with a sulfonylurea, but was still unwilling to trial Trulicity. He rescheduled his PCP appts in July and August, so has not yet had a repeat A1C.   Today, patient reports doing ok. He continues to state he is adherent to his medications despite discrepancies in fill hx. He is still not willing to start a weekly injectable Glp-RA despite recent increase in A1C, but he is interested in the LIBERATE study.   Current diabetes medications include: glipizide  XL 5 mg daily (last filled July 2025 for 30ds), metformin  XR 1000 mg BID (taking 500 mg BID, last filled July 2025 for 90ds) Current hypertension medications include: lisinopril  40 mg daily, amlodipine  10 mg daily, metoprolol  tartrate 25 mg BID Current hyperlipidemia medications include: atorvastatin  40 mg daily, aspirin  81 mg daily  Patient reports adherence to taking all medications as prescribed, but fill hx suggests frequently missed doses  Do you feel that your medications are working for you? yes Have you been experiencing any side effects to the medications  prescribed? no Do you have any problems obtaining medications due to transportation or finances? Yes - uses [facility] supply.  Insurance coverage: He has Medicare A/B, but no part D coverage.  Patient denies hypoglycemic events.  CGM Study Consent: Yes Study visit: Initial Visit  Diabetes Distress Scale Feeling like diabetes is taking up too much of my mental and physical energy every day.: A moderate problem Feeling that my doctor doesn't know enough about diabetes and diabetic care. : A serious problem Feeling angry, scared, and/or depressed when I think about living with diabetes : Somewhat serious problem Feeling that my doctor doesn't give my clear directions on how to manage my diabetes. : Not a problem Feeling that im not testing my blood sugars frequently enough.: Somewhat serious problem Feeling that I'm often failing with my diabetes routine: Somewhat serious problem Feelling that friends and family are not supportive enough of self care efforts.: A slight problem Feeling that diabetes controls my life.: A serious problem Feeling that my doctor doesn't take my concerns seriously enough: Not a problem Not feeling confident in my day to day ability to manage diabetes: A moderate problem Feeling that I will end up with serious long term complications no matter what I do.: A moderate problem Feeling that I am not sticking closely enough to a good meal plan.: A slight problem Feeling that friends or family don't appreciate how difficult living with diabetes can be. : A serious problem Feeling overwhelmed by the demands of living with diabetes.: A serious problem Feeling that I don't have a doctor who I can see.: A slight problem  Not feeling motivated to keep up my diabetes self management.: A slight problem Feeling that friends or family don't give me the emotional support that I would like. : A moderate problem DDS17 Score: 54 Emotional Burden Score: 4 Physician related distress  score: 2.25 Regimen Related Distress score : 3 Interpersonal distress score: 3.33    Patient denies nocturia (nighttime urination).  Patient reports neuropathy (nerve pain) - stable Patient denies visual changes. Patient reports self foot exams.   Current meal patterns: Eating 3 meals a day. Previously was having 2 tortillas with each meal (6 total daily) and rice with many of his meals > today, reports that he has cut back on tortillas and rice. Drinks: Water  throughout the day (up to 4x 16 oz). Coffee in the morning. No soda or juice  Patient-reported exercise habits: Walks every afternoon.    O:   Lab Results  Component Value Date   HGBA1C 8.4 (A) 11/12/2023   HGBA1C 9.1 (A) 09/30/2023   HGBA1C 8.4 (A) 05/12/2023    BP Readings from Last 3 Encounters:  09/30/23 138/75  09/17/23 (!) 142/86  09/05/23 (!) 148/93    Lipid Panel     Component Value Date/Time   CHOL 239 (H) 06/27/2022 1533   TRIG 272 (H) 06/27/2022 1533   HDL 48 06/27/2022 1533   CHOLHDL 5.0 06/27/2022 1533   CHOLHDL 4.1 02/17/2019 0424   VLDL 26 02/17/2019 0424   LDLCALC 142 (H) 06/27/2022 1533   LDLDIRECT 73.0 02/18/2019 0250    Clinical Atherosclerotic Cardiovascular Disease (ASCVD): Yes  - hx of embolic stroke The ASCVD Risk score (Arnett DK, et al., 2019) failed to calculate for the following reasons:   Risk score cannot be calculated because patient has a medical history suggesting prior/existing ASCVD     A/P:  LIBERATE Study:  -Patient provided verbal consent to participate in the study. Consent documented in electronic medical record.  -Provided education on Libre 3 CGM. Collaborated to ensure Herlene 3 app was downloaded on patient's phone. Educated on how to place sensor every 14 days, patient placed first sensor correctly and verbalized understanding of use, removal, and how to place next sensor. Discussed alarms. 6 sensors provided for a 3 month supply. Educated to contact the office if  the sensor falls off early and replacements are needed before their next [employer].    Diabetes longstanding and currently uncontrolled with A1C today of 8.4% above goal < 7%. Patient would benefit from weekly injectable GLP-1RA (Trulicity available via [facility]), however, he has not been interested in injectable medications. Emphasized importance of adherence today and encouraged him to increase to full dose metformin , as prescribed. Expect that use of CGM will help patient achieve improved glycemic control, as he identified that tortillas and rice likely contribute to elevated sugars. -Continued metformin  XR 1000 mg BID - encouraged patient to take 2 tabs twice daily instead of 1 tab twice daily as he has been doing - Continued glipizide  XL 5 mg daily - low threshold to discontinue with risk of hypoglycemia and balance limitations s/p stroke -Extensively discussed pathophysiology of diabetes, recommended lifestyle interventions, dietary effects on blood sugar control.  -Counseled on s/sx of and management of hypoglycemia.  -Next A1c anticipated 02/12/23.   ASCVD risk - secondary prevention in patient with diabetes. Last LDL is 142 mg/dL not at goal of <29 mg/dL, though patient is overdue for repeat lipid panel. High intensity statin indicated. -Continued atorvastatin  40 mg daily - Repeat lipid panel at  PCP f/u  Written patient instructions provided. Patient verbalized understanding of treatment plan.  Total time in face to face counseling 45 minutes.    Follow-up:  Pharmacist telephone 12/24/23. PCP clinic visit 12/31/23  Lorain Baseman, PharmD [facility] [phone]

## 2023-11-12 NOTE — Patient Instructions (Addendum)
 Fue un gusto verte hoy!  Tu objetivo de glucosa en sangre es de 80 a 130 mg/dL antes de comer y menos de 180 mg/dL despus de comer. Controla tu glucosa en casa y lleva un registro (con un glucmetro o en papel) para traerlo a tu prxima cita.  Llmame si tienes algn problema con el sensor.  Cambios en la medicacin:  Toma 2 comprimidos de metformina XR (1000 mg en total) por la maana y por la noche.  Contina tomando 1 comprimido de glipizida XL de 5 mg una vez al da.  Puedes probar Miralax  (polietilenglicol), una tapita disuelta en agua u otra bebida una vez al da para el estreimiento. Tarda aproximadamente 3 das en marriott. Lo puedes comprar sin receta en la farmacia por menos de $10.  Contina con el resto de tu medicacin.  Si experimentas sntomas de hipoglucemia (mareo, temblores, sudoracin), controla tu glucosa en sangre. Si su nivel de glucosa en sangre es inferior a 70 mg/dL, debe ingerir 15 gramos de carbohidratos de rpida absorcin, como 4 tabletas de glucosa, media taza de refresco o media taza de jugo. Vuelva a medir su glucosa en sangre despus de 15 minutos. Si el nivel sigue por debajo de 70 mg/dL, repita el proceso. Si esto ocurre con frecuencia, debe informar a su mdico.  Recomendaciones de estilo de vida:  Procure realizar 150 minutos de ejercicio de intensidad moderada cada semana. Se trata de cualquier actividad que aumente su ritmo cardaco y therapist, music, pero que le permita mantener una conversacin. Hacer ejercicio despus de las comidas puede ayudar a prevenir picos de event organiser.  Recomendaciones de alimentacin:    Intente comer 3 comidas completas al da siguiendo el mtodo del plato saludable y 1 o 2 refrigerios. No pase ms de 4 o 5 horas despierto sin comer. Desayune durante la primera hora despus de levantarse.  Los carbohidratos incluyen almidn, azcar y [place]. El azcar y el almidn elevan la glucosa en sangre.  Los alimentos  ricos en almidn incluyen pan, arroz, pasta, papas, maz, cereales, smola de maz, galletas saladas, bagels, muffins y todos los productos horneados.   Algunas frutas tienen un mayor contenido de azcar, como las uvas, la sanda, las naranjas y la [place] de las frutas tropicales. Limita tu porcin a media taza por vez.  Los alimentos ricos en protenas incluyen carne, pescado, aves, huevos, lcteos y legumbres (aunque las legumbres tambin aportan carbohidratos).  Las verduras sin almidn no lexicographer de banker. Entre ellas se incluyen las verduras de marriott, el brcoli, los [address], las zanahorias, adult nurse, [place], los [place], los pimientos, el calabacn [place], el calabacn verde y [place], por nombrar algunas!  Evita todas las bebidas azucaradas. Mantente hidratado con agua durante todo medical laboratory scientific officer. El agua con gas o saborizada, el t sin azcar, el caf negro y las bebidas sin azcar o dietticas no afectarn tu nivel de azcar en la sangre, pero no deben ser tu nica fuente de hidratacin!  Para obtener ms informacin, visite https://diabetes.org/food-nutrition/eating-healthy  Lorain Baseman, PharmD [facility] [phone]  _______________________________________________________________________________   It was nice to see you today!  Your goal blood sugar is 80-130 mg/dL before eating and less than 180 mg/dL after eating. Monitor blood sugars at home and keep a log (glucometer or piece of paper) to bring with you to your next visit  Call me if you have issues with your sensor.   Medication Changes:  Take metformin  XR 2 tablets (1000 mg total) in the morning and evening   Continue glipizide  XL 5 mg (1 tablet) once daily  You can try Miralax  (polyethylene glycol) 1 capful mixed into water  or another drink once daily for constipation. It takes about 3 days to work. This is available over the counter at the  pharmacy for less than $10.    Continue all other medication the same.   If you experience symptoms of a low blood sugar (dizzy, shaky, sweaty) check your blood sugar. If it is less than 70 mg/dL, you should eat or drink 15 grams of fast-acting carbohydrates like 4 glucose tablets, 1/2 cup of regular soda, or 1/2 cup of juice. Recheck your blood sugar after 15 minutes. If the level is still below 70 mg/dL repeat the process. If this occurs frequently, you should notify your healthcare provider.   Lifestyle Recommendations:  Aim for 150 minutes of moderate intensity exercise every week. This is any activity that elevates your heart rate and breathing rate, but still allows you to carry on a conversation. Exercising after meals can help prevent spikes in your blood sugar.  Diet Recommendations:   Try to eat 3 real meals using the healthy plate method and 1-2 snacks per day. Never go more than 4-5 hours while awake without eating. Eat breakfast within the first hour of getting up.     Carbohydrates include starch, sugar, and fiber. Sugar and starch raise blood glucose.  Starchy foods include bread, rice, pasta, potatoes, corn, cereal, grits, crackers, bagels, muffins, all baked foods Some fruits are higher in sugar, like grapes, watermelon, oranges, and most tropical fruits. Limit your serving size to 1/2 cup at a time.  Protein foods include meat, fish, poultry, eggs, dairy, and beans (although beans also provide carbohydrates).  Non-starchy vegetables do not impact your blood sugar very much. These include greens, broccoli, asparagus, carrots, cauliflower, cucumber, mushrooms, peppers, yellow squash, zucchini squash, tomato, to name a few!  Avoid all sugary beverages. Stay hydrated with water  throughout the day. Sparkling/flavored water , unsweet tea, black coffee, and zero-sugar or diet drinks will not impact your blood sugar, but they should not be used as your only source of  hydration!  You can find more information at https://diabetes.org/food-nutrition/eating-healthy   Lorain Baseman, PharmD [facility] [phone]

## 2023-11-18 ENCOUNTER — Other Ambulatory Visit: Payer: Self-pay

## 2023-11-19 ENCOUNTER — Telehealth: Payer: Self-pay

## 2023-11-19 DIAGNOSIS — E11621 Type 2 diabetes mellitus with foot ulcer: Secondary | ICD-10-CM

## 2023-11-19 NOTE — Progress Notes (Addendum)
 Outreached patient for LIBERATE one week call. Patient reports he did accidentally knock off his sensor yesterday, but prior to that it was working well. He will have his daughter help him apply a new one soon. He does report low blood sugars in the morning, which are demonstrated on his AGP report. Associated with shakiness. Instructed patient to STOP glipizide  and CONTINUE metformin  XR 1000 mg BID. Will follow-up with patient as scheduled.   CGM Study Study visit: 7 Day Telephone Call  CGM Data Download date: 11/19/23 % Time CGM Is Active: 43 % Average glucose (mg/dL): 868 mg/dL Glucose Management Indicator (%): 6.4 % Glucose Variability (%): 34.5 % Time Above Range >180 mg/dL (%): 15 % Time in Range 70-180 mg/dL (%): 84 % Time Below Range <70 mg/dL (%): 1 %       Telephone follow-up scheduled 12/24/23  Lorain Baseman, PharmD [facility] [phone]

## 2023-11-24 ENCOUNTER — Other Ambulatory Visit: Payer: Self-pay

## 2023-11-25 ENCOUNTER — Other Ambulatory Visit: Payer: Self-pay

## 2023-11-28 ENCOUNTER — Telehealth: Payer: Self-pay

## 2023-11-28 NOTE — Progress Notes (Signed)
 Patient called in reporting issues with glucose and CGM. Sensor is reading consistently low and this does not seem to be accurate, as he is only taking metformin  and does not have s/sx of hypoglycemia. He has double checked with a finger stick this morning when sensor was reading LOW (50 mg/dL) and blood glucose was 120 mg/dL.       Advised patient to remove faulty sensor and replace with extra supply. Will provide 2 replacement sensors at appointment with PCP in December. Confirmed that he did discontinue glipizide  as previously instructed and is only taking metformin  XR 1000 mg BID.  Lorain Baseman, PharmD [facility] [phone]

## 2023-12-02 ENCOUNTER — Other Ambulatory Visit: Payer: Self-pay

## 2023-12-09 ENCOUNTER — Other Ambulatory Visit: Payer: Self-pay

## 2023-12-17 ENCOUNTER — Other Ambulatory Visit: Payer: Self-pay

## 2023-12-17 ENCOUNTER — Telehealth: Payer: Self-pay

## 2023-12-17 NOTE — Progress Notes (Signed)
 Patient called as he received notification of the recall of the [facility] 3 sensors he was using, which were given to him through the LIBERATE study. [employer] ID [id number]. Patient is concerned because he says the message states his sensor was infected. Clarified that the recall is related to the possibility that his CGM was given him false LOW blood sugar readings. Upon reviewing his CGM data, it does appear that patient may have worn several sensors with this defect as he was getting recurrent hypoglycemia alarms, but he is not on any medication that increases risk of hypoglycemia and most recent A1C was > 8%. Patient expresses understanding and is willing to pick up 4 replacement sensors at the front desk tomorrow. Prepared sensors for patient and communicated with front desk staff at [facility].   Will follow up with patient as scheduled for telephone call on 12/24/23  Lorain Baseman, PharmD [facility] [phone]

## 2023-12-24 ENCOUNTER — Other Ambulatory Visit (INDEPENDENT_AMBULATORY_CARE_PROVIDER_SITE_OTHER): Payer: Self-pay

## 2023-12-24 DIAGNOSIS — E1165 Type 2 diabetes mellitus with hyperglycemia: Secondary | ICD-10-CM

## 2023-12-24 NOTE — Progress Notes (Cosign Needed Addendum)
 12/25/2023 Name: Andrew Bruce MRN: [id number] DOB: [birth year]  Chief Complaint  Patient presents with   Diabetes    Andrew Bruce is a 58 y.o. year old male who presented for a telephone visit. Outreach patient using [employer], [place] #[id number]    They were referred to the pharmacist by their PCP for assistance in managing diabetes. 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    Subjective: Patient was last seen by PCP, [name], NP, on 09/30/23. At last visit, A1C was 9.1%. He was enrolled in LIBERATE study with pharmacy on 11/12/23. Since then glycemic control has been much improved. We discontinued glipizide  due to low-blood sugars and glycemic control has been maintained with metformin  monotherapy.  Today, patient reports doing well.   Care Team: Primary Care Provider: Borer Bascom RAMAN, NP ; Next Scheduled Visit: 12/31/23   Medication Access/Adherence  Current Pharmacy:  D[facility] LAW - [facility] [address] [place] [address] Phone: [phone] Fax: [phone]  [facility] - [facility] [address], [address] [place] [address] Phone: [phone] Fax: [phone]   Patient reports affordability concerns with their medications: No  Patient reports access/transportation concerns to their pharmacy: No  Patient reports adherence concerns with their medications:  No     Diabetes:  Current medications: metformin  XR 1000 mg BID (taking 500 mg BID) Medications tried in the past: glipzide  Current glucose readings: Downloaded 12/24/23 (receiving CGM via LIBERATE study)      Patient denies hypoglycemic s/sx including dizziness, shakiness, sweating. Patient denies hyperglycemic symptoms including polyuria, polydipsia, polyphagia, nocturia, neuropathy, blurred vision.  Current meal patterns:   Eating 3 meals a day. Previously was having 2 tortillas with each meal (6 total daily) and rice with many of his meals > reports that he has cut back on tortillas and rice. Drinks: Water  throughout the day (up to 4x 16 oz). Coffee in the morning. No soda or juice  Current physical activity: Walks every afternoon.   Current medication access support: Medicare A/B  Hypertension:  Current medications: lisinopril  40 mg daily, amlodipine  10 mg daily, metoprolol  tartrate 25 mg BID  Medications previously tried:   Patient has an automated, upper arm home BP cuff Current blood pressure readings readings: recalls SBP of 128, DBP 79 this past week  Patient denies hypotensive s/sx including dizziness, lightheadedness.  Patient denies hypertensive symptoms including headache, chest pain, shortness of breath  Hyperlipidemia/ASCVD Risk Reduction   Current lipid lowering medications: atorvastatin  40 mg daily   Antiplatelet regimen: aspirin  81 mg daily   ASCVD History: embolic stroke during COVID-19 infection in 2020 Risk Factors: ASCVD, HLD, HTN, T2DM  Clinical ASCVD: Yes  The ASCVD Risk score (Arnett DK, et al., 2019) failed to calculate for the following reasons:   Risk score cannot be calculated because patient has a medical history suggesting prior/existing ASCVD   Objective:  BP Readings from Last 3 Encounters:  09/30/23 138/75  09/17/23 (!) 142/86  09/05/23 (!) 148/93    Lab Results  Component Value Date   HGBA1C 8.4 (A) 11/12/2023   HGBA1C 9.1 (A) 09/30/2023   HGBA1C 8.4 (A) 05/12/2023       Latest Ref Rng & Units 05/12/2023    2:19 PM 12/27/2022    3:43 PM 06/27/2022    3:33 PM  BMP  Glucose 70 - 99 mg/dL 832  864  794  BUN 6 - 24 mg/dL 14  22  15    Creatinine 0.76 - 1.27 mg/dL 9.07  8.86  9.18   BUN/Creat Ratio 9 - 20 15  19  19    Sodium 134 - 144 mmol/L 137  140  141   Potassium 3.5 - 5.2 mmol/L 4.3  4.4  4.1   Chloride 96 - 106 mmol/L 101  102  103   CO2 20 -  29 mmol/L 21  19  19    Calcium  8.7 - 10.2 mg/dL 9.0  9.2  9.2     Lab Results  Component Value Date   CHOL 239 (H) 06/27/2022   HDL 48 06/27/2022   LDLCALC 142 (H) 06/27/2022   LDLDIRECT 73.0 02/18/2019   TRIG 272 (H) 06/27/2022   CHOLHDL 5.0 06/27/2022    Medications Reviewed Today     Reviewed by Brinda Lorain SQUIBB, RPH-CPP (Pharmacist) on 12/25/23 at 0830  Med List Status: <None>   Medication Order Taking? Sig Documenting Provider Last Dose Status Informant  Accu-Chek Softclix Lancets lancets [id number]  Use as instructed to check blood sugar daily. Dx E11.65 Oley Bascom RAMAN, NP  Active   acetaminophen  (TYLENOL ) 500 MG tablet [id number]  Take by mouth. [provider]  Active            Med Note SAMULE IHA   Wed Mar 27, 2022  3:20 PM) Prn    amLODipine  (NORVASC ) 10 MG tablet [id number] Yes Take 1 tablet (10 mg total) by mouth daily. Oley Bascom RAMAN, NP  Active   aspirin  EC 81 MG tablet [id number]  Take 1 tablet (81 mg total) by mouth daily. Oley Bascom RAMAN, NP  Active   atorvastatin  (LIPITOR) 40 MG tablet [id number] Yes Take 1 tablet (40 mg total) by mouth daily. Oley Bascom RAMAN, NP  Active   citalopram  (CELEXA ) 20 MG tablet [id number] Yes Take 1 tablet (20 mg total) by mouth daily. For mood [name], [name], MD  Active     Discontinued 01/17/23 0914 (Change in therapy)            Med Note (POWERS, RAVEN M   Thu Jan 09, 2023  9:18 AM) Switch for Pregabalin  on 02/07/22 next month  ibuprofen (ADVIL) 800 MG tablet [id number]  Take by mouth. [provider]  Active            Med Note ZENA, CHERYL A   Tue Apr 09, 2022  2:43 PM) As needed  lisinopril  (ZESTRIL ) 40 MG tablet [id number] Yes Take 1 tablet (40 mg total) by mouth daily. Oley Bascom RAMAN, NP  Active   metFORMIN  (GLUCOPHAGE -XR) 500 MG 24 hr tablet [id number] Yes Take 2 tablets (1,000 mg total) by mouth 2 (two) times daily with a meal. Oley Bascom RAMAN, NP  Active   metoprolol  tartrate (LOPRESSOR ) 25 MG tablet  [id number] Yes Take 1 tablet (25 mg total) by mouth 2 (two) times daily. For blood pressure Oley Bascom RAMAN, NP  Active   pregabalin  (LYRICA ) 150 MG capsule [id number]  Take 1 capsule (150 mg total) by mouth in the morning, at noon, and at bedtime.  Patient not taking: Reported on 11/12/2023   [name], [name], MD  Active   traMADol  (ULTRAM ) 50 MG tablet [id number]  Take 2 tablets (100 mg total) by mouth in the morning, at noon, and at bedtime. For chronic pain [name], [name], MD  Active               Assessment/Plan:  Diabetes: - Currently controlled with 2 week TIR of 86% above goal > 70%. Much improved compared to most recent A1C of 8.4% above goal < 7%. Patient is controlling BG with diet and lifestyle + metformin  at a TDD of 1000 mg/d. Appropriate to continue current regimen.  - Last UACR Aug 2023 - 2 mg/g - Reviewed long term cardiovascular and renal outcomes of uncontrolled blood sugar - Reviewed goal A1c, goal fasting, and goal 2 hour post prandial glucose - Reviewed dietary modifications including  utilizing the healthy plate method, limiting portion size of carbohydrate foods, increasing intake of protein and non-starchy vegetables. Counseled patient to stay hydrated with water  throughout the day. - Reviewed lifestyle modifications including: aiming for 150 minutes of moderate intensity exercise every week.  - Recommend to continue metformin  XR as currently taking - 500 mg BID  - Recommend to check glucose continuously using CGM - Next A1C due 02/12/24      Hypertension: - Currently controlled with home BP consistently below goal less than 130/80. Patient is not having s/sx of hypo- or hyper-tension. Medication adherence appears appropriate.  - Reviewed long term cardiovascular and renal outcomes of uncontrolled blood pressure - Reviewed appropriate blood pressure monitoring technique and reviewed goal blood pressure. Recommended to check home blood pressure and heart rate once  daily and keep a log to bring to upcoming appointments - Recommend to continue lisinopril  40 mg daily, amlodipine  10 mg daily, metoprolol  tartrate 25 mg BID      Hyperlipidemia/ASCVD Risk Reduction: - Currently uncontrolled with most recent LDL-C of 142 mg/dL above goal < 70 mg/dL. Due for repeat lipid panel. High intensity statin indicated. - Reviewed long term complications of uncontrolled cholesterol - Recommend to continue atorvastatin  40 mg daily - Repeat lipid panel at PCP f/u   Written patient instructions provided. Patient verbalized understanding of treatment plan.   Follow Up Plan:  Pharmacist in person 02/11/24 PCP clinic visit in 12/31/23  Lorain Baseman, PharmD [facility] [phone]

## 2023-12-26 ENCOUNTER — Other Ambulatory Visit: Payer: Self-pay

## 2023-12-26 ENCOUNTER — Encounter: Attending: Physical Medicine and Rehabilitation | Admitting: Physical Medicine and Rehabilitation

## 2023-12-26 ENCOUNTER — Encounter: Payer: Self-pay | Admitting: Physical Medicine and Rehabilitation

## 2023-12-26 VITALS — BP 134/78 | HR 65 | Ht 66.0 in | Wt 193.0 lb

## 2023-12-26 DIAGNOSIS — G894 Chronic pain syndrome: Secondary | ICD-10-CM | POA: Insufficient documentation

## 2023-12-26 DIAGNOSIS — G8111 Spastic hemiplegia affecting right dominant side: Secondary | ICD-10-CM

## 2023-12-26 DIAGNOSIS — F332 Major depressive disorder, recurrent severe without psychotic features: Secondary | ICD-10-CM | POA: Diagnosis not present

## 2023-12-26 DIAGNOSIS — M792 Neuralgia and neuritis, unspecified: Secondary | ICD-10-CM | POA: Diagnosis not present

## 2023-12-26 DIAGNOSIS — Z9989 Dependence on other enabling machines and devices: Secondary | ICD-10-CM | POA: Insufficient documentation

## 2023-12-26 MED ORDER — CITALOPRAM HYDROBROMIDE 40 MG PO TABS
40.0000 mg | ORAL_TABLET | Freq: Every day | ORAL | 1 refills | Status: AC
  Filled 2023-12-26: qty 90, 90d supply, fill #0

## 2023-12-26 MED ORDER — TRAMADOL HCL 50 MG PO TABS
100.0000 mg | ORAL_TABLET | Freq: Three times a day (TID) | ORAL | 5 refills | Status: AC
  Filled 2023-12-26: qty 180, 30d supply, fill #0

## 2023-12-26 MED ORDER — PREGABALIN 75 MG PO CAPS
75.0000 mg | ORAL_CAPSULE | Freq: Three times a day (TID) | ORAL | 5 refills | Status: AC
  Filled 2023-12-26: qty 90, 30d supply, fill #0

## 2023-12-26 NOTE — Progress Notes (Signed)
 Subjective:    Patient ID: Andrew Bruce, male    DOB: Feb 25, 1965, 58 y.o.   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 Original admission 01/26/19- and came to CIR 03/23/19- discharged- 04/20/19.  Has visual difficulties- double vision due to stroke.  Here for f/u on stroke as well as nerve pain and DM.  Getting better little by little.    Using interpretor.    Feels dizzy when gets up in AM- very dizzy. Feels very bad! Thinks it's the cause, but takes so many meds, not sure what medicine is causing him to feel bad.   Takes meds at night- in the morning- wakes up feeling not well- specifically dizziness- and sometimes when going down the stairs, has to hold on to something.   Tried to not take Lyrica  for a couple of days- and didn't feel as bad.      Feels like it's coming from Lyrica .  BP is only 130s-150's- in the AM when he takes it.   Wondering about vitamins to feel better- somewhat depressed-  Family has to push him to go out-  Started when the sun changed- went down earlier.   No energy- not feeling like will hurt self- and no interest/anhedonia.    Pain Inventory Average Pain 8 Pain Right Now 8 My pain is sharp  In the last 24 hours, has pain interfered with the following? General activity 9 Relation with others 8 Enjoyment of life 5 What TIME of day is your pain at its worst? morning  Sleep (in general) Fair  Pain is worse with: . Pain improves with: rest Relief from Meds: 8  Family History  Problem Relation Age of Onset   Healthy Sister    Healthy Brother     Social History   Socioeconomic History   Marital status: Married    Spouse name: Not on file   Number of children: 8   Years of education: Not on file   Highest education level: 6th grade  Occupational History   Not on file  Tobacco Use   Smoking status: Former   Smokeless tobacco: Never  Vaping Use   Vaping status: Never Used  Substance and Sexual Activity   Alcohol use: Not Currently    Comment: rare to occasional   Drug use: Never   Sexual activity: Yes    Birth control/protection: None  Other Topics Concern   Not on file  Social History Narrative   Not on file   Social Drivers of Health   Tobacco Use: Medium Risk (09/30/2023)   Patient History    Smoking Tobacco Use: Former    Smokeless Tobacco Use: Never    Passive Exposure: Not on file  Financial Resource Strain: High Risk (09/21/2021)   [facility]   Overall Financial Resource Strain (CARDIA)    Difficulty of Paying Living Expenses: Very hard  Food Insecurity: Food Insecurity Present (05/12/2023)   Hunger Vital Sign    Worried About Running Out of Food in the Last Year: Often true  Ran Out of Food in the Last Year: Often true  Transportation Needs: No Transportation Needs (05/12/2023)   PRAPARE - Administrator, Civil Service (Medical): No    Lack of Transportation (Non-Medical): No  Physical Activity: Not on file  Stress: Not on file  Social Connections: Not on file  Depression (PHQ2-9): Low Risk (05/16/2023)   Depression (PHQ2-9)    PHQ-2 Score: 0  Recent Concern: Depression (PHQ2-9) - High Risk (05/12/2023)   Depression (PHQ2-9)    PHQ-2 Score: 24  Alcohol Screen: Not on file  Housing: Unknown (05/12/2023)   Housing Stability Vital Sign    Unable to Pay for Housing in the Last Year: No    Number of Times Moved in the Last Year: Not on file    Homeless in the Last Year: No  Utilities: Not on file  Health Literacy: Not on file   Past Surgical History:  Procedure  Laterality Date   AMPUTATION Right 08/13/2019   Procedure: AMPUTATION RIGHT FIRST AND SECOND TOES;  Surgeon: Oris Krystal FALCON, MD;  Location: [facility] OR;  Service: Vascular;  Laterality: Right;   DIRECT LARYNGOSCOPY Bilateral 04/06/2019   Procedure: MICRO DIRECT LARYNGOSCOPY WITH PROLARYN INJECTION;  Surgeon: Carlie Clark, MD;  Location[facility] OR;  Service: ENT;  Laterality: Bilateral;   IR GASTROSTOMY TUBE MOD SED  03/10/2019   IR GASTROSTOMY TUBE REMOVAL  04/20/2019   WOUND DEBRIDEMENT Left 08/13/2019   Procedure: AMPUTATION  OF LEFT FIRST TOE, AND DEBRIDEMENT OF LEFT SECOND, THIRD AND FOURTH TOES;  Surgeon: Oris Krystal FALCON, MD;  Location: [facility] OR;  Service: Vascular;  Laterality: Left;   Past Surgical History:  Procedure Laterality Date   AMPUTATION Right 08/13/2019   Procedure: AMPUTATION RIGHT FIRST AND SECOND TOES;  Surgeon: Oris Krystal FALCON, MD;  Location: [facility] OR;  Service: Vascular;  Laterality: Right;   DIRECT LARYNGOSCOPY Bilateral 04/06/2019   Procedure: MICRO DIRECT LARYNGOSCOPY WITH PROLARYN INJECTION;  Surgeon: Carlie Clark, MD;  Location: [facility] OR;  Service: ENT;  Laterality: Bilateral;   IR GASTROSTOMY TUBE MOD SED  03/10/2019   IR GASTROSTOMY TUBE REMOVAL  04/20/2019   WOUND DEBRIDEMENT Left 08/13/2019   Procedure: AMPUTATION  OF LEFT FIRST TOE, AND DEBRIDEMENT OF LEFT SECOND, THIRD AND FOURTH TOES;  Surgeon: Oris Krystal FALCON, MD;  Location: [facility] OR;  Service: Vascular;  Laterality: Left;   Past Medical History:  Diagnosis Date   COVID-19 02/06/2019   Diabetes mellitus without complication (HCC)    Hypertension    PE (pulmonary thromboembolism) (HCC)    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   Pneumonia    with Covid   Stroke (HCC)    weakness on right side, occurred while he had Covid   BP 134/78   Pulse 65   Ht 5' 6 (1.676 m)   Wt 193 lb (87.5 kg)   SpO2 92%   BMI 31.15 kg/m   Opioid Risk Score:   Fall Risk Score:  `1  Depression screen PHQ 2/9     05/16/2023    1:22 PM  05/12/2023    1:54 PM 12/27/2022    3:18 PM 06/27/2022    2:51 PM 03/27/2022    3:25 PM 11/30/2021    2:34 PM 09/26/2021    3:44 PM  Depression screen PHQ 2/9  Decreased Interest 0 3 1 3 3 3  0  Down, Depressed, Hopeless 0 3 0 3 3 3  0  PHQ - 2 Score  0 6 1 6 6 6  0  Altered sleeping  3 3 3 3  3   Tired, decreased energy  3 3 3 3  3   Change in appetite  3 3 3 3   0  Feeling bad or failure about yourself   3 2 2 3   0  Trouble concentrating  3 2 3 3  3   Moving slowly or fidgety/restless  3 3 3 3   0  Suicidal thoughts  0 0 0 1  0  PHQ-9 Score  24  17  23  25   9    Difficult doing work/chores  Extremely dIfficult Somewhat difficult Very difficult Very difficult       Data saved with a previous flowsheet row definition     Review of Systems  Musculoskeletal:        B/L knee and feet pain  All other systems reviewed and are negative.      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 Original admission 01/26/19- and came to CIR 03/23/19- discharged- 04/20/19.  Has visual difficulties- double vision due to stroke.  Here for f/u on stroke as well as nerve pain and DM.  Let's decrease Lyrica  to 75 mg  3x/day- do this for  4 days-   and if that's not enough, reduce to 75 mg 2x/day-   2.  Im wondering if BP or Blood sugar could still be involved - in you feeling bad.   3. I usually find that Lyrica  makes you feel dizzy RIGHT after takes the medicine, not the next morning.   4.  Con't Tramadol - last refill 09/05/23- will refill so doesn't run out before next appointment.   5.  He has seasonal affective depression- responds very well to Full spectrum light- be in same room with this light 1-2 hours/day.  EVERY DAY!!! 10,000 lux- specifically for SAD.   6. I will also increase mood medication- increase Celexa  to 40 mg daily- - for mood!- changed medicine dosage for you   7. Last Drug screen 05/16/23- is not due yet per clinic policy.    8. F/U in 4 months double appt- Severe cva, with spastic hemiparesis, Seasonal affective disorder and depression- currently- and gait abnormality.   I spent a total of 34   minutes on total care today- >50% coordination of care- due to d/w pt about SAD, CVA, weakness, abnormal gait- falls; and  sx's of dizziness, etc - likely from Lyrica . Had to tease this out!

## 2023-12-26 NOTE — Patient Instructions (Addendum)
 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 Original admission 01/26/19- and came to CIR 03/23/19- discharged- 04/20/19.  Has visual difficulties- double vision due to stroke.  Here for f/u on stroke as well as nerve pain and DM.  Let's decrease Lyrica  to 75 mg  3x/day- do this for  4 days-   and if that's not enough, reduce to 75 mg 2x/day-   2.  Im wondering if BP or Blood sugar could still be involved - in you feeling bad.   3. I usually find that Lyrica  makes you feel dizzy RIGHT after takes the medicine, not the next morning.   4. Con't Tramadol - last refill 09/05/23- will refill so doesn't run out before next appointment.   5.  He has seasonal affective depression- responds very well to Full spectrum light- be in same room with this light 1-2 hours/day.  EVERY DAY!!! 10,000 lux- specifically for SAD.   6. I will also increase mood medication- increase Celexa  to 40 mg daily- - for mood!- changed medicine dosage for you   7. Last Drug screen 05/16/23- is not due yet per clinic policy.    8. F/U in 4 months double appt- Severe cva, with spastic hemiparesis, Seasonal affective disorder and depression- currently- and gait abnormality.

## 2023-12-31 ENCOUNTER — Other Ambulatory Visit: Payer: Self-pay

## 2023-12-31 ENCOUNTER — Ambulatory Visit: Payer: Self-pay | Admitting: Nurse Practitioner

## 2023-12-31 ENCOUNTER — Encounter: Payer: Self-pay | Admitting: Nurse Practitioner

## 2023-12-31 VITALS — BP 139/83 | HR 57 | Wt 191.4 lb

## 2023-12-31 DIAGNOSIS — L97509 Non-pressure chronic ulcer of other part of unspecified foot with unspecified severity: Secondary | ICD-10-CM | POA: Diagnosis not present

## 2023-12-31 DIAGNOSIS — Z13220 Encounter for screening for lipoid disorders: Secondary | ICD-10-CM

## 2023-12-31 DIAGNOSIS — E11621 Type 2 diabetes mellitus with foot ulcer: Secondary | ICD-10-CM | POA: Diagnosis not present

## 2023-12-31 DIAGNOSIS — E1165 Type 2 diabetes mellitus with hyperglycemia: Secondary | ICD-10-CM

## 2023-12-31 NOTE — Progress Notes (Signed)
 Subjective   Patient ID: Andrew Bruce, male    DOB: 1965-02-23, 58 y.o.   MRN: [id number]  Chief Complaint  Patient presents with   Diabetes   Labs Only    Per [place]     Referring provider: Oley Bascom RAMAN, NP  Detric Scalisi is a 58 y.o. male with Past Medical History: 02/06/2019: COVID-19 No date: Diabetes mellitus without complication (HCC) No date: Hypertension No date: PE (pulmonary thromboembolism) (HCC)     Comment:  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 in setting of COVID               ARDS No date: Pneumonia     Comment:  with Covid No date: Stroke [facility]     Comment:  weakness on right side, occurred while he had Covid  HPI  Diabetes:   Patient presents for diabetic follow up. Currently taking metformin . Will add glipizide  xl daily. Complaint with diabetic diet. Taking medications. A1C at last visit was 9.1. We have placed a referral for diabetic medication management with pharmacist.    Note: patient has not been taking metformin  correctly   Hypertension:   Taking medications as directed. Checking blood pressures at home - within normal range. No issues or concerns.   Denies f/c/s, n/v/d, hemoptysis, PND, leg swelling Denies chest pain or edema   Allergies[1]  Immunization History  Administered Date(s) Administered   Influenza, Seasonal, Injecte, Preservative Fre 09/27/2022   Influenza,inj,Quad PF,6+ Mos 03/25/2019, 11/17/2020   Pneumococcal Polysaccharide-23 03/25/2019, 11/17/2020    Tobacco History: Tobacco Use History[2] Counseling given: Not Answered   Outpatient Encounter Medications as of 12/31/2023  Medication Sig   Accu-Chek Softclix Lancets lancets Use as instructed to check blood sugar daily. Dx E11.65   acetaminophen  (TYLENOL ) 500 MG tablet Take by mouth.   amLODipine  (NORVASC ) 10 MG tablet Take 1 tablet (10 mg total) by mouth daily.   aspirin  EC 81 MG tablet Take 1 tablet (81 mg total) by mouth daily.   atorvastatin  (LIPITOR) 40 MG  tablet Take 1 tablet (40 mg total) by mouth daily.   citalopram  (CELEXA ) 40 MG tablet Take 1 tablet (40 mg total) by mouth daily.   ibuprofen (ADVIL) 800 MG tablet Take by mouth.   lisinopril  (ZESTRIL ) 40 MG tablet Take 1 tablet (40 mg total) by mouth daily.   metFORMIN  (GLUCOPHAGE -XR) 500 MG 24 hr tablet Take 2 tablets (1,000 mg total) by mouth 2 (two) times daily with a meal.   metoprolol  tartrate (LOPRESSOR ) 25 MG tablet Take 1 tablet (25 mg total) by mouth 2 (two) times daily. For blood pressure   pregabalin  (LYRICA ) 75 MG capsule Take 1 capsule (75 mg total) by mouth 3 (three) times daily.   traMADol  (ULTRAM ) 50 MG tablet Take 2 tablets (100 mg total) by mouth in the morning, at noon, and at bedtime. For chronic pain   [DISCONTINUED] gabapentin  (NEURONTIN ) 100 MG capsule Take 1 capsule (100 mg total) by mouth at bedtime.   No facility-administered encounter medications on file as of 12/31/2023.    Review of Systems  Review of Systems  Constitutional: Negative.   HENT: Negative.    Cardiovascular: Negative.   Gastrointestinal: Negative.   Allergic/Immunologic: Negative.   Neurological: Negative.   Psychiatric/Behavioral: Negative.       Objective:   BP 139/83 (BP Location: Left Arm, Patient Position: Sitting, Cuff Size: Large)   Pulse (!) 57   Wt 191 lb 6.4 oz (86.8 kg)   SpO2 98%  BMI 30.89 kg/m   Wt Readings from Last 5 Encounters:  12/31/23 191 lb 6.4 oz (86.8 kg)  12/26/23 193 lb (87.5 kg)  09/30/23 190 lb (86.2 kg)  09/17/23 192 lb (87.1 kg)  09/05/23 189 lb (85.7 kg)     Physical Exam Vitals and nursing note reviewed.  Constitutional:      General: He is not in acute distress.    Appearance: He is well-developed.  Cardiovascular:     Rate and Rhythm: Normal rate and regular rhythm.  Pulmonary:     Effort: Pulmonary effort is normal.     Breath sounds: Normal breath sounds.  Skin:    General: Skin is warm and dry.  Neurological:     Mental Status: He  is alert and oriented to person, place, and time.       Assessment & Plan:   Lipid screening  Type 2 diabetes, controlled, with ulcer of toe [facility])  Pharmacist has already ordered labs.  Return in about 2 months (around 03/02/2024).   Bascom GORMAN Borer, NP 12/31/2023     [1] No Known Allergies [2]  Social History Tobacco Use  Smoking Status Former  Smokeless Tobacco Never

## 2024-01-01 ENCOUNTER — Ambulatory Visit: Payer: Self-pay | Admitting: Nurse Practitioner

## 2024-01-01 LAB — MICROALBUMIN / CREATININE URINE RATIO
Creatinine, Urine: 123.6 mg/dL
Microalb/Creat Ratio: 7 mg/g{creat} (ref 0–29)
Microalbumin, Urine: 8.2 ug/mL

## 2024-01-01 LAB — LIPID PANEL
Chol/HDL Ratio: 2.6 ratio (ref 0.0–5.0)
Cholesterol, Total: 155 mg/dL (ref 100–199)
HDL: 59 mg/dL (ref >39)
LDL Chol Calc (NIH): 80 mg/dL (ref 0–99)
Triglycerides: 83 mg/dL (ref 0–149)
VLDL Cholesterol Cal: 16 mg/dL (ref 5–40)

## 2024-01-23 ENCOUNTER — Telehealth: Payer: Self-pay

## 2024-01-23 NOTE — Progress Notes (Signed)
 Patient called to relay that he is in need of more FL3 sensors for the LIBERATE study. He was provided 4 sensors on 12/17/23. Reports that each of them have fallen off or failed around the 8 day mark. Suggested using overpatch in the future. Will place 2 replacement sensors for patient to pick up at the [facility] front desk after Tuesday 01/27/24.  Reviewed glucose report which indicates glycemic control is at goal with TIR of 77% above goal > 70%.      Confirmed next appt on 02/11/24  [place] Baseman, PharmD [facility] [phone]

## 2024-02-11 ENCOUNTER — Other Ambulatory Visit: Payer: Self-pay

## 2024-02-11 DIAGNOSIS — E11621 Type 2 diabetes mellitus with foot ulcer: Secondary | ICD-10-CM

## 2024-02-11 DIAGNOSIS — L97509 Non-pressure chronic ulcer of other part of unspecified foot with unspecified severity: Secondary | ICD-10-CM | POA: Diagnosis not present

## 2024-02-11 DIAGNOSIS — I10 Essential (primary) hypertension: Secondary | ICD-10-CM

## 2024-02-11 LAB — POCT GLYCOSYLATED HEMOGLOBIN (HGB A1C): HbA1c, POC (controlled diabetic range): 6.9 % (ref 0.0–7.0)

## 2024-02-11 MED ORDER — LISINOPRIL 40 MG PO TABS
40.0000 mg | ORAL_TABLET | Freq: Every day | ORAL | 5 refills | Status: AC
  Filled 2024-02-11: qty 30, 30d supply, fill #0

## 2024-02-11 NOTE — Research (Signed)
 "   S:     Chief Complaint  Patient presents with   Diabetes   59 y.o. male who presents for diabetes evaluation, education, and management in the context of the LIBERATE Study.  Today, patient arrives in  good spirits and presents without any assistance. He ambulates with a cane. He was accompanied by a [facility] interpreter.   They were referred to the pharmacist by their PCP, [name], NP for assistance in managing diabetes. 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   Patient was engaged by pharmacy via telephone on 06/16/23. At this appointment he reported taking metformin  and glipizide  as prescribed. He was not willing to start injectable Trulicity at this appointment (fear of injections). At last pharmacy telephone appt on 07/28/23, patient reported that his BG were under control. Patient reported episode of hypoglycemia associated with a sulfonylurea, but was still unwilling to trial Trulicity. He rescheduled his PCP appts in July and August, so has not yet had a repeat A1C. At clinic appt on 11/12/23, he was enrolled in the LIBERATE study. Since then BG have been improved and he has been cutting back his medications.    Today, patient reports doing ok. He is only taking metformin  XR 500 mg once daily. Focusing on diet and lifestyle to control blood sugars.   Current diabetes medications include: metformin  XR 1000 mg BID (taking 500 mg once daily) Current hypertension medications include: lisinopril  40 mg daily, amlodipine  10 mg daily, metoprolol  tartrate 25 mg BID Current hyperlipidemia medications include: atorvastatin  40 mg daily, aspirin  81 mg daily   Patient reports adherence to taking all medications as prescribed, but fill hx suggests frequently missed doses   Do you feel that your medications are working for you? yes Have you been experiencing any side effects to the medications prescribed? no Do you  have any problems obtaining medications due to transportation or finances? Yes Insurance coverage: Appears he now has Medicare Part D ([employer])  Patient denies hypoglycemic events.  Patient denies nocturia (nighttime urination).  Patient reports neuropathy (nerve pain) - stable Patient denies visual changes. Patient reports self foot exams.   Current meal patterns:  Eating 3 meals a day. Previously was having 2 tortillas with each meal (6 total daily) and rice with many of his meals > reports that he has cut back on tortillas and rice. Drinks: Water  throughout the day (up to 4x 16 oz). Coffee in the morning. No soda or juice  Patient-reported exercise habits: Walks every afternoon.    O:   ROS  Physical Exam  CGM Study Study visit: 3 Month Follow Up Visit  CGM Data Download date: 02/11/24 % Time CGM Is Active: 89 % Average glucose (mg/dL): 867 mg/dL Glucose Management Indicator (%): 6.5 % Glucose Variability (%): 31.8 % Time Above Range >180 mg/dL (%): 14 % Time in Range 70-180 mg/dL (%): 82 % Time Below Range <70 mg/dL (%): 4 %  Diabetes Distress Scale Feeling like diabetes is taking up too much of my mental and physical energy every day.: Somewhat serious problem Feeling that my doctor doesn't know enough about diabetes and diabetic care. : Not a problem Feeling angry, scared, and/or depressed when I think about living with diabetes : Somewhat serious problem Feeling that my doctor doesn't give my clear directions on how to manage my diabetes. : Not a problem Feeling that im not testing my blood sugars frequently enough.: A  slight problem Feeling that I'm often failing with my diabetes routine: Not a problem Feelling that friends and family are not supportive enough of self care efforts.: Not a problem Feeling that diabetes controls my life.: A slight problem Feeling that my doctor doesn't take my concerns seriously enough: Not a problem Not feeling confident in my day  to day ability to manage diabetes: A slight problem Feeling that I will end up with serious long term complications no matter what I do.: Not a problem Feeling that I am not sticking closely enough to a good meal plan.: Not a problem Feeling that friends or family don't appreciate how difficult living with diabetes can be. : Not a problem Feeling overwhelmed by the demands of living with diabetes.: A slight problem Feeling that I don't have a doctor who I can see.: Not a problem Not feeling motivated to keep up my diabetes self management.: Not a problem Feeling that friends or family don't give me the emotional support that I would like. : Not a problem DDS17 Score: 27 Emotional Burden Score: 2.6 Physician related distress score: 1 Regimen Related Distress score : 1.4 Interpersonal distress score: 1      Lab Results  Component Value Date   HGBA1C 6.9 02/11/2024   HGBA1C 8.4 (A) 11/12/2023   HGBA1C 9.1 (A) 09/30/2023    There were no vitals filed for this visit.   Lipid Panel     Component Value Date/Time   CHOL 155 12/31/2023 0923   TRIG 83 12/31/2023 0923   HDL 59 12/31/2023 0923   CHOLHDL 2.6 12/31/2023 0923   CHOLHDL 4.1 02/17/2019 0424   VLDL 26 02/17/2019 0424   LDLCALC 80 12/31/2023 0923   LDLDIRECT 73.0 02/18/2019 0250    Clinical Atherosclerotic Cardiovascular Disease (ASCVD): Yes  - hx of embolic stroke (during COVID infection) The ASCVD Risk score (Arnett DK, et al., 2019) failed to calculate for the following reasons:   Risk score cannot be calculated because patient has a medical history suggesting prior/existing ASCVD   * - Cholesterol units were assumed     A/P:  LIBERATE Study:  - 6 sensors provided for a 3 month supply. Educated to contact the office if the sensor falls off early and replacements are needed before their next [employer].   Diabetes: - Currently controlled with 2 week TIR of 89% above goal > 70%. Consistent with improvement  in A1C to 6.9% below goal < 7%. Patient has been successful in managing T2D with diet and lifestyle, as he has been able to decrease his medication to metformin  XR 500 mg daily without significant impact to his glycemic control. Will continue current dose of metformin  for now.    - Last UACR Aug 2023 - 2 mg/g - Reviewed long term cardiovascular and renal outcomes of uncontrolled blood sugar - Reviewed goal A1c, goal fasting, and goal 2 hour post prandial glucose - Reviewed dietary modifications including  utilizing the healthy plate method, limiting portion size of carbohydrate foods, increasing intake of protein and non-starchy vegetables. Counseled patient to stay hydrated with water  throughout the day. - Reviewed lifestyle modifications including: aiming for 150 minutes of moderate intensity exercise every week.  - Recommend to continue metformin  XR 500 mg daily. If blood sugars increase or are consistently above 130 mg/dL upon waking, increase to 500 mg BID.  - Recommend to check glucose continuously using CGM. Will investigate continued coverage of CGM after study ends.  - Next A1C due 05/12/24  Hyperlipidemia/ASCVD Risk Reduction: - Currently uncontrolled with most recent LDL-C of 80 mg/dL above goal < 70 mg/dL. Improved from 142 mg/dL. High intensity statin indicated. Suspect missed doses of atorvastatin  given fill hx. Emphasized importance of daily adherence. - Reviewed long term complications of uncontrolled cholesterol - Recommend to continue atorvastatin  40 mg daily    Written patient instructions provided. Patient verbalized understanding of treatment plan. Will request pharmacy to fill amlodipine , aspirin , atorvastatin , lisinopril , and metoprolol  tartrate.    Follow Up Plan:  Pharmacist telephone 03/31/24 PCP clinic visit 03/03/24   [place] Baseman, PharmD [facility] [phone]  "

## 2024-02-11 NOTE — Patient Instructions (Addendum)
 Fue un placer verte hoy!  Excelente control de tus niveles de azcar en sangre! Sigue as!  Cambios en la medicacin: Contina tomando metformina XR 500 mg (1 comprimido) una vez al [facility]. Si notas que tus niveles de azcar en sangre aumentan, puedes volver a tomar metformina XR 500 mg consolidated edison.  Contina tomando todos los dems medicamentos como de [place].  _________________________________________________________________   It was nice to see you today!  Excellent job with your blood sugars. Keep up the good work!  Medication Changes: Continue metformin  XR 500 mg (1 tablet) once daily. If you start to notice your blood sugars increasing, you can increase back to metformin  XR 500 mg TWICE daily.  Continue all other medication the same.   [place] Baseman, PharmD [facility] [phone]

## 2024-02-12 ENCOUNTER — Other Ambulatory Visit: Payer: Self-pay

## 2024-03-03 ENCOUNTER — Ambulatory Visit: Payer: Self-pay | Admitting: Nurse Practitioner

## 2024-03-31 ENCOUNTER — Other Ambulatory Visit: Payer: Self-pay

## 2024-04-26 ENCOUNTER — Encounter: Admitting: Physical Medicine and Rehabilitation
